# Patient Record
Sex: Male | Born: 1940 | Race: White | Hispanic: No | Marital: Married | Smoking: Former smoker
Health system: Southern US, Community
[De-identification: ages and names within clinical notes are randomized; demographics above are authoritative.]

## PROBLEM LIST (undated history)

## (undated) DIAGNOSIS — E785 Hyperlipidemia, unspecified: Secondary | ICD-10-CM

## (undated) DIAGNOSIS — E039 Hypothyroidism, unspecified: Secondary | ICD-10-CM

## (undated) DIAGNOSIS — Z9889 Other specified postprocedural states: Secondary | ICD-10-CM

## (undated) DIAGNOSIS — K5900 Constipation, unspecified: Secondary | ICD-10-CM

## (undated) DIAGNOSIS — I4819 Other persistent atrial fibrillation: Secondary | ICD-10-CM

## (undated) DIAGNOSIS — Z952 Presence of prosthetic heart valve: Secondary | ICD-10-CM

## (undated) DIAGNOSIS — N183 Chronic kidney disease, stage 3 unspecified: Secondary | ICD-10-CM

## (undated) DIAGNOSIS — Z950 Presence of cardiac pacemaker: Secondary | ICD-10-CM

## (undated) DIAGNOSIS — I739 Peripheral vascular disease, unspecified: Secondary | ICD-10-CM

## (undated) DIAGNOSIS — E038 Other specified hypothyroidism: Secondary | ICD-10-CM

## (undated) DIAGNOSIS — E119 Type 2 diabetes mellitus without complications: Secondary | ICD-10-CM

## (undated) DIAGNOSIS — I5032 Chronic diastolic (congestive) heart failure: Secondary | ICD-10-CM

## (undated) DIAGNOSIS — G4730 Sleep apnea, unspecified: Secondary | ICD-10-CM

## (undated) DIAGNOSIS — I2510 Atherosclerotic heart disease of native coronary artery without angina pectoris: Secondary | ICD-10-CM

## (undated) DIAGNOSIS — I10 Essential (primary) hypertension: Secondary | ICD-10-CM

## (undated) DIAGNOSIS — G629 Polyneuropathy, unspecified: Secondary | ICD-10-CM

## (undated) DIAGNOSIS — C4490 Unspecified malignant neoplasm of skin, unspecified: Secondary | ICD-10-CM

## (undated) DIAGNOSIS — I350 Nonrheumatic aortic (valve) stenosis: Secondary | ICD-10-CM

## (undated) DIAGNOSIS — R112 Nausea with vomiting, unspecified: Secondary | ICD-10-CM

## (undated) HISTORY — DX: Hypothyroidism, unspecified: E03.9

## (undated) HISTORY — DX: Type 2 diabetes mellitus without complications: E11.9

## (undated) HISTORY — DX: Other persistent atrial fibrillation: I48.19

## (undated) HISTORY — DX: Peripheral vascular disease, unspecified: I73.9

## (undated) HISTORY — DX: Other specified hypothyroidism: E03.8

## (undated) HISTORY — DX: Chronic kidney disease, stage 3 (moderate): N18.3

## (undated) HISTORY — DX: Chronic kidney disease, stage 3 unspecified: N18.30

## (undated) HISTORY — DX: Hyperlipidemia, unspecified: E78.5

## (undated) HISTORY — DX: Sleep apnea, unspecified: G47.30

## (undated) HISTORY — DX: Atherosclerotic heart disease of native coronary artery without angina pectoris: I25.10

## (undated) HISTORY — DX: Essential (primary) hypertension: I10

## (undated) HISTORY — PX: SKIN CANCER EXCISION: SHX779

---

## 1963-11-12 HISTORY — PX: APPENDECTOMY: SHX54

## 2009-06-16 LAB — HM COLONOSCOPY: HM Colonoscopy: NORMAL

## 2010-11-11 DIAGNOSIS — I4819 Other persistent atrial fibrillation: Secondary | ICD-10-CM | POA: Diagnosis present

## 2010-11-11 HISTORY — DX: Other persistent atrial fibrillation: I48.19

## 2010-12-12 HISTORY — PX: CARDIAC CATHETERIZATION: SHX172

## 2011-01-01 ENCOUNTER — Encounter: Payer: Self-pay | Admitting: Cardiovascular Disease

## 2011-01-09 ENCOUNTER — Encounter: Payer: Self-pay | Admitting: Cardiovascular Disease

## 2011-01-10 ENCOUNTER — Encounter: Payer: Self-pay | Admitting: Cardiovascular Disease

## 2011-01-10 ENCOUNTER — Institutional Professional Consult (permissible substitution) (INDEPENDENT_AMBULATORY_CARE_PROVIDER_SITE_OTHER): Payer: Medicare Other | Admitting: Cardiovascular Disease

## 2011-01-10 DIAGNOSIS — I4891 Unspecified atrial fibrillation: Secondary | ICD-10-CM | POA: Insufficient documentation

## 2011-01-10 DIAGNOSIS — E785 Hyperlipidemia, unspecified: Secondary | ICD-10-CM | POA: Insufficient documentation

## 2011-01-10 DIAGNOSIS — E7800 Pure hypercholesterolemia, unspecified: Secondary | ICD-10-CM

## 2011-01-10 DIAGNOSIS — I2510 Atherosclerotic heart disease of native coronary artery without angina pectoris: Secondary | ICD-10-CM | POA: Insufficient documentation

## 2011-01-10 DIAGNOSIS — E1169 Type 2 diabetes mellitus with other specified complication: Secondary | ICD-10-CM | POA: Insufficient documentation

## 2011-01-10 DIAGNOSIS — I10 Essential (primary) hypertension: Secondary | ICD-10-CM

## 2011-01-11 ENCOUNTER — Telehealth (INDEPENDENT_AMBULATORY_CARE_PROVIDER_SITE_OTHER): Payer: Self-pay | Admitting: *Deleted

## 2011-01-17 NOTE — Assessment & Plan Note (Signed)
Summary: [place]. cath was don...   Visit Type:  Initial Consult Referring Provider:  Gaspar Garbe MD  CC:  None.  History of Present Illness: 70 yo WM with h/o DM, HTN, hyperlipidemia and recent diagnosis of CAD during recent stay in [place]. He woke up and felt weak and sweaty. He was found to have atrial fibrillation with RVR. He ruled in for an MI with cardiac enzymes. Cardiac cath with occluded RCA with collaterals. Cath report is not available today but will be requested. He was hospitalized on 12/27/10 for four days at [facility] [name] KitchenHe was started on Pradaxa for anticoagulation. He has felt well since hospital discharge  without chest pain or SOB.  He has not noticed his heart racing.   Current Medications (verified): 1)  Metformin Hcl 1000 Mg Tabs (Metformin Hcl) .... Two Times A Day 2)  Glipizide Xl 10 Mg Xr24h-Tab (Glipizide) .... Two Times A Day 3)  Lantus Solostar 100 Unit/ml Soln (Insulin Glargine) .... Use As Directed 4)  Januvia 100 Mg Tabs (Sitagliptin Phosphate) .... Once Daily 5)  Humalog Kwikpen 100 Unit/ml Soln (Insulin Lispro (Human)) .... Use As Directed 6)  Atenolol 50 Mg Tabs (Atenolol) .... Take One Tablet By Mouth Daily 7)  Lipitor 80 Mg Tabs (Atorvastatin Calcium) .... Take One Tablet By Mouth Daily. 8)  Trilipix 135 Mg Cpdr (Choline Fenofibrate) .... Once Daily 9)  Aspirin Ec 325 Mg Tbec (Aspirin) .... Take One Tablet By Mouth Daily 10)  Pradaxa 150 Mg Caps (Dabigatran Etexilate Mesylate) .... Once Daily 11)  Colace 100 Mg Caps (Docusate Sodium) .[name] Kitchen.. 1 Tab, As Needed  Allergies (verified): No Known Drug Allergies  Past History:  Past Medical History: Atrial Fibrillation on Pradaxa Diabetes Type 2 Hyperlipidemia Hypertension CAD - diffuse disease with collaterals, no stents  Past Surgical History: Appendectomy age 25  Family History: Reviewed history from 01/09/2011 and no changes  required. Father:deceased age-48 ? MI, CHF Mother: Deceased age-49-DM Siblings: 2-Brothers / 1-with DM --- 2-Sisters / 1-with DM --- Daughter also has DM2  Social History: Reviewed history from 01/09/2011 and no changes required. Tobacco Use - Former.  quit at age 56 Regular Exercise - yes 2-tmes a week walks Drug Use - no No alcohol Full Time Museum/gallery exhibitions officer Married with 1-daughter  Review of Systems  The patient denies fatigue, malaise, fever, weight gain/loss, vision loss, decreased hearing, hoarseness, chest pain, palpitations, shortness of breath, prolonged cough, wheezing, sleep apnea, coughing up blood, abdominal pain, blood in stool, nausea, vomiting, diarrhea, heartburn, incontinence, blood in urine, muscle weakness, joint pain, leg swelling, rash, skin lesions, headache, fainting, dizziness, depression, anxiety, enlarged lymph nodes, easy bruising or bleeding, and environmental allergies.    Vital Signs:  Patient profile:   70 year old male Height:      72 inches Weight:      268 pounds BMI:     36.48 Pulse rate:   103 / minute BP sitting:   124 / 80  (left arm) Cuff size:   regular  Vitals Entered By: Caralee Ates CMA (January 10, 2011 4:29 PM)  Physical Exam  General:  General: Well developed, well nourished, NAD HEENT: OP clear, mucus membranes moist SKIN: warm, dry Neuro: No focal deficits Musculoskeletal: Muscle strength 5/5 all ext Psychiatric: Mood and affect normal Neck: No JVD, no carotid bruits, no thyromegaly, no lymphadenopathy. Lungs:Clear bilaterally, no wheezes, rhonci, crackles ZO:[id number],  no murmurs, gallops rubs  Abdomen: soft, NT, ND, BS present Extremities: No edema, pulses 2+.    EKG  Procedure date:  01/11/2011  Findings:      A. fib, rate 103 bpm. Non-specific T wave flattening.   Impression & Recommendations:  Problem # 1:  CAD, NATIVE VESSEL (ICD-414.01) Will get cath report. No stents were placed at the other hospital. Continue  asa, beta blocker, statin.   His updated medication list for this problem includes:    Atenolol 100 Mg Tabs (Atenolol) .[name] Kitchen... Take one tablet by mouth daily    Aspirin Ec 325 Mg Tbec (Aspirin) .[name] Kitchen... Take one tablet by mouth daily  Orders: EKG w/ Interpretation (93000)  Problem # 2:  ATRIAL FIBRILLATION (ICD-427.31) Will increase atenolol from 50 mg to 100 mg for better rate control. Continue Pradaxa.   His updated medication list for this problem includes:    Atenolol 100 Mg Tabs (Atenolol) .[name] Kitchen... Take one tablet by mouth daily    Aspirin Ec 325 Mg Tbec (Aspirin) .[name] Kitchen... Take one tablet by mouth daily  Problem # 3:  HYPERTENSION, BENIGN (ICD-401.1) Well controlled.   Problem # 4:  HYPERLIPIDEMIA-MIXED (ICD-272.4) Will get records of last fasting lipid profile. Continue statin.   His updated medication list for this problem includes:    Lipitor 80 Mg Tabs (Atorvastatin calcium) .[name] Kitchen... Take one tablet by mouth daily.    Trilipix 135 Mg Cpdr (Choline fenofibrate) ..... Once daily  Patient Instructions: 1)  Your physician recommends that you schedule a follow-up appointment in: 6 months with Dr. Clifton James 2)  Your physician has recommended you make the following change in your medication: INCREASE ATENOLOL to 100mg  by mouth daily.  Prescriptions: ATENOLOL 100 MG TABS (ATENOLOL) Take one tablet by mouth daily  #90 x 3   Entered by:   Whitney Maeola Sarah RN   Authorized by:   Verne Carrow, MD   Signed by:   Ellender Hose RN on 01/10/2011   Method used:   Faxed to ...       [facility] [address] (mail-order)       [address] [place], [place]  [address]       Ph: [phone]       Fax: [phone]   RxID:   [id number] PRADAXA 150 MG CAPS (DABIGATRAN ETEXILATE MESYLATE) take one tablet by mouth two times a day.  #180 x 3   Entered by:   Whitney Maeola Sarah RN   Authorized by:   Verne Carrow, MD   Signed by:   Ellender Hose RN on 01/10/2011   Method  used:   Faxed to ...       [facility] [address] (mail-order)       [address] [place], [place]  [address]       Ph: [phone]       Fax: [phone]   RxID:   [id number]

## 2011-01-17 NOTE — Progress Notes (Signed)
  ROI Faxed to [facility] @ [phone] [facility]  January 11, 2011 9:03 AM

## 2011-01-29 ENCOUNTER — Telehealth: Payer: Self-pay | Admitting: Cardiovascular Disease

## 2011-01-29 NOTE — Telephone Encounter (Signed)
We should add him onto my schedule to be seen. I do not think this is from the Pradaxa but I would like to see him. Thanks, chris

## 2011-01-29 NOTE — Telephone Encounter (Signed)
Phone call from wife-Patient had  SOB, swelling of feet, and tightness in chest which began over weekend.  He went to see PCP(Dr. Tisebec) yesterday who prescribed Lasix.  Today swelling is down, less SOB, and chest does not feel tight anymore.  Wife concerned because they are leaving next month for [place] and [place].  Wanted Dr. Sanjuana Kava to know and questions if his new medication Pradaxa has anything to do with it or if he needs to come in prior to trip just to make sure he will be OK.

## 2011-01-29 NOTE — Telephone Encounter (Signed)
Pt is having sob and swelling legs and feet. No chest pain.

## 2011-01-29 NOTE — Telephone Encounter (Signed)
Patient aware* He will follow-up with Dr. Clifton James 02/06/11

## 2011-02-05 ENCOUNTER — Encounter: Payer: Self-pay | Admitting: *Deleted

## 2011-02-06 ENCOUNTER — Encounter: Payer: Self-pay | Admitting: Cardiovascular Disease

## 2011-02-06 ENCOUNTER — Ambulatory Visit (INDEPENDENT_AMBULATORY_CARE_PROVIDER_SITE_OTHER): Payer: Medicare Other | Admitting: Cardiovascular Disease

## 2011-02-06 VITALS — BP 126/76 | Resp 18 | Ht 71.0 in | Wt 265.8 lb

## 2011-02-06 DIAGNOSIS — I4891 Unspecified atrial fibrillation: Secondary | ICD-10-CM

## 2011-02-06 DIAGNOSIS — I2510 Atherosclerotic heart disease of native coronary artery without angina pectoris: Secondary | ICD-10-CM

## 2011-02-06 NOTE — Assessment & Plan Note (Signed)
Stable No changes 

## 2011-02-06 NOTE — Assessment & Plan Note (Signed)
Rate controlled. He is on Pradaxa for anticoagulation. Will consider cardioversion at next visit if he remains in atrial fibrillation. No changes.

## 2011-02-06 NOTE — Progress Notes (Signed)
Addended by: Floreen Comber on: 02/06/2011 03:34 PM   Modules accepted: Orders

## 2011-02-06 NOTE — Progress Notes (Signed)
History of Present Illness: 70 yo WM with h/o DM, HTN, hyperlipidemia and recent diagnosis of CAD here for cardiac follow up. I saw him as a new patient3/02/12. Prior to his first appt, he woke up while in [place]  and felt weak and sweaty. He was found to have atrial fibrillation with RVR. He ruled in for an MI with cardiac enzymes. Cardiac cath with occluded RCA with collaterals. He was hospitalized on 12/27/10 for four days at [facility].He was started on Pradaxa for anticoagulation.   Since I saw him three weeks ago, he developed some edema and SOB and was started on Lasix per primary care. This has helped. His SOB is completely resolved. He has lost 10 pounds. No chest pain. He feels great. He has plans to retire in one week. He has not been aware of any palpitations.    Past Medical History  Diagnosis Date  . Atrial fibrillation     patient is on pradaxa  . DM type 2 (diabetes mellitus, type 2)   . Other and unspecified hyperlipidemia   . Unspecified essential hypertension   . Coronary artery disease     Cath February 2012 in [place] [facility], occluded RCA with collaterals    Past Surgical History  Procedure Date  . Appendectomy     Current Outpatient Prescriptions  Medication Sig Dispense Refill  . aspirin 325 MG tablet Take 325 mg by mouth daily.        [name] Kitchen atenolol (TENORMIN) 100 MG tablet Take 100 mg by mouth daily.        [name] Kitchen atorvastatin (LIPITOR) 80 MG tablet Take 80 mg by mouth daily.        . Choline Fenofibrate (TRILIPIX) 135 MG capsule Take 135 mg by mouth daily.        . dabigatran (PRADAXA) 150 MG CAPS Take 150 mg by mouth every 12 (twelve) hours.        . docusate sodium (COLACE) 100 MG capsule Take 100 mg by mouth as needed.        . furosemide (LASIX) 20 MG tablet Take by mouth. Pt unsure of dose.       [name] Kitchen glipiZIDE (GLUCOTROL) 10 MG tablet Take 10 mg by mouth 2 (two) times daily before a meal.        . insulin glargine (LANTUS) 100 UNIT/ML injection  Inject into the skin as directed.        . Insulin Lispro, Human, (HUMALOG KWIKPEN [place]) Inject into the skin as directed.        . metFORMIN (GLUMETZA) 1000 MG (MOD) 24 hr tablet Take 1,000 mg by mouth 2 (two) times daily.        . sitaGLIPtan (JANUVIA) 100 MG tablet Take 100 mg by mouth daily.          No Known Allergies  History   Social History  . Marital Status: Married    Spouse Name: N/A    Number of Children: N/A  . Years of Education: N/A   Occupational History  . Not on file.   Social History Main Topics  . Smoking status: Former Games developer  . Smokeless tobacco: Not on file  . Alcohol Use: No  . Drug Use: No  . Sexually Active:    Other Topics Concern  . Not on file   Social History Narrative  . No narrative on file    Family History  Problem Relation Age of Onset  . Diabetes Mother   .  Heart attack Father   . Heart failure Father   . Diabetes Other     Review of Systems:  No chest pain, SOB, palpitations, dizziness,  near syncope or syncope.  No PND, orthopnea, or Lower extremity edema.   BP 126/76  Resp 18  Ht 5\' 11"  (1.803 m)  Wt 265 lb 12.8 oz (120.566 kg)  BMI 37.07 kg/m2  Physical Examination: General: Well developed, well nourished, NAD HEENT: OP clear, mucus membranes moist SKIN: warm, dry. No rashes. Neuro: No focal deficits Musculoskeletal: Muscle strength 5/5 all ext Psychiatric: Mood and affect normal Neck: No JVD, no carotid bruits, no thyromegaly, no lymphadenopathy. Lungs:Clear bilaterally, no wheezes, rhonci, crackles Cardiovascular: Regular rate and rhythm. No murmurs, gallops or rubs. Abdomen:Soft. Bowel sounds present. Non-tender.  Extremities: No lower extremity edema. Pulses are 2 + in the bilateral DP/PT.  ZOX:WRUEAV fibrillation, rate 95 bpm. Non-specific T wave abnormalities.

## 2011-02-06 NOTE — Patient Instructions (Signed)
Your physician recommends that you schedule a follow-up appointment in: 6 months  

## 2011-02-07 NOTE — Consult Note (Signed)
Summary: [facility]   Imported By: Marylou Mccoy 01/28/2011 13:06:04  _____________________________________________________________________  External Attachment:    Type:   Image     Comment:   External Document

## 2011-06-26 ENCOUNTER — Telehealth: Payer: Self-pay | Admitting: Cardiovascular Disease

## 2011-06-26 ENCOUNTER — Encounter: Payer: Self-pay | Admitting: Cardiovascular Disease

## 2011-06-26 ENCOUNTER — Ambulatory Visit (INDEPENDENT_AMBULATORY_CARE_PROVIDER_SITE_OTHER): Payer: Medicare Other | Admitting: Cardiovascular Disease

## 2011-06-26 VITALS — BP 112/64 | HR 110 | Ht 71.0 in | Wt 263.4 lb

## 2011-06-26 DIAGNOSIS — R0609 Other forms of dyspnea: Secondary | ICD-10-CM

## 2011-06-26 DIAGNOSIS — R0600 Dyspnea, unspecified: Secondary | ICD-10-CM | POA: Insufficient documentation

## 2011-06-26 DIAGNOSIS — I4891 Unspecified atrial fibrillation: Secondary | ICD-10-CM

## 2011-06-26 DIAGNOSIS — R0989 Other specified symptoms and signs involving the circulatory and respiratory systems: Secondary | ICD-10-CM

## 2011-06-26 MED ORDER — METOPROLOL TARTRATE 50 MG PO TABS: 50.0000 mg | ORAL_TABLET | Freq: Two times a day (BID) | ORAL | Status: DC

## 2011-06-26 NOTE — Patient Instructions (Signed)
Your physician recommends that you schedule a follow-up appointment in: 2-3 weeks with [facility]   Your physician has recommended you make the following change in your medication: STOP ATENOLOL and START LOPRESSOR 50 mg twice daily.   Your physician has requested that you have an echocardiogram. Echocardiography is a painless test that uses sound waves to create images of your heart. It provides your doctor with information about the size and shape of your heart and how well your heart's chambers and valves are working. This procedure takes approximately one hour. There are no restrictions for this procedure.

## 2011-06-26 NOTE — Assessment & Plan Note (Signed)
Elevated rate. Will stop atenolol and change to Lopressor 50 mg po BID. Continue Pradaxa. At next visit, will plan DCCV if still in a. Fib.

## 2011-06-26 NOTE — Progress Notes (Signed)
History of Present Illness:70 yo WM with h/o DM, HTN, hyperlipidemia and recent diagnosis of CAD here for cardiac follow up. I saw him as a new patien t3/02/12. Prior to his first appt, he woke up while in [place] and felt weak and sweaty. He was found to have atrial fibrillation with RVR. He ruled in for an MI with cardiac enzymes. Cardiac cath with occluded RCA with collaterals. [facility] He was started on Pradaxa for anticoagulation.   He is still in atrial fibrillation. He recently went to [place] and was easily fatigued with SOB. No chest pain.    Past Medical History  Diagnosis Date  . Atrial fibrillation     patient is on pradaxa  . DM type 2 (diabetes mellitus, type 2)   . Other and unspecified hyperlipidemia   . Unspecified essential hypertension   . Coronary artery disease     Cath February 2012 in [place] [facility], occluded RCA with collaterals    Past Surgical History  Procedure Date  . Appendectomy     Current Outpatient Prescriptions  Medication Sig Dispense Refill  . aspirin 325 MG tablet Take 325 mg by mouth daily.        [name] Kitchen atenolol (TENORMIN) 100 MG tablet Take 100 mg by mouth daily.        [name] Kitchen atorvastatin (LIPITOR) 80 MG tablet Take 80 mg by mouth daily.        . BD INSULIN SYRINGE ULTRAFINE 31G X 5/16" 0.5 ML MISC       . Choline Fenofibrate (TRILIPIX) 135 MG capsule Take 135 mg by mouth daily.        . dabigatran (PRADAXA) 150 MG CAPS Take 150 mg by mouth every 12 (twelve) hours.        . docusate sodium (COLACE) 100 MG capsule Take 100 mg by mouth as needed.        [name] Kitchen FREESTYLE LITE test strip       . furosemide (LASIX) 20 MG tablet Take 20 mg by mouth daily. Pt unsure of dose.      [name] Kitchen glipiZIDE (GLUCOTROL) 10 MG tablet Take 10 mg by mouth 2 (two) times daily before a meal.        . insulin glargine (LANTUS) 100 UNIT/ML injection Inject into the skin as directed.        . Insulin Lispro, Human,  (HUMALOG KWIKPEN [place]) Inject into the skin as directed.        . metFORMIN (GLUMETZA) 1000 MG (MOD) 24 hr tablet Take 1,000 mg by mouth 2 (two) times daily.        . sitaGLIPtan (JANUVIA) 100 MG tablet Take 100 mg by mouth daily.          No Known Allergies  History   Social History  . Marital Status: Married    Spouse Name: N/A    Number of Children: N/A  . Years of Education: N/A   Occupational History  . Not on file.   Social History Main Topics  . Smoking status: Former Games developer  . Smokeless tobacco: Not on file  . Alcohol Use: No  . Drug Use: No  . Sexually Active: Not on file   Other Topics Concern  . Not on file   Social History Narrative  . No narrative on file    Family History  Problem Relation Age of Onset  . Diabetes Mother   . Heart attack  Father   . Heart failure Father   . Diabetes Other     Review of Systems:  As stated in the HPI and otherwise negative.   BP 112/64  Pulse 110  Ht 5\' 11"  (1.803 m)  Wt 263 lb 6.4 oz (119.477 kg)  BMI 36.74 kg/m2  SpO2 97%  Physical Examination: General: Well developed, well nourished, NAD HEENT: OP clear, mucus membranes moist SKIN: warm, dry. No rashes. Neuro: No focal deficits Musculoskeletal: Muscle strength 5/5 all ext Psychiatric: Mood and affect normal Neck: No JVD, no carotid bruits, no thyromegaly, no lymphadenopathy. Lungs:Clear bilaterally, no wheezes, rhonci, crackles Cardiovascular: Irregular. Tachycardic. No murmurs, gallops or rubs. Abdomen:Soft. Bowel sounds present. Non-tender.  Extremities: No lower extremity edema. Pulses are 2 + in the bilateral DP/PT.  EKG:

## 2011-06-26 NOTE — Assessment & Plan Note (Signed)
Echo to assess LV function.

## 2011-06-28 ENCOUNTER — Ambulatory Visit (HOSPITAL_COMMUNITY): Payer: Medicare Other | Attending: Cardiovascular Disease | Admitting: Radiology

## 2011-06-28 DIAGNOSIS — I079 Rheumatic tricuspid valve disease, unspecified: Secondary | ICD-10-CM | POA: Insufficient documentation

## 2011-06-28 DIAGNOSIS — I10 Essential (primary) hypertension: Secondary | ICD-10-CM | POA: Insufficient documentation

## 2011-06-28 DIAGNOSIS — R0600 Dyspnea, unspecified: Secondary | ICD-10-CM

## 2011-06-28 DIAGNOSIS — I379 Nonrheumatic pulmonary valve disorder, unspecified: Secondary | ICD-10-CM | POA: Insufficient documentation

## 2011-06-28 DIAGNOSIS — I2510 Atherosclerotic heart disease of native coronary artery without angina pectoris: Secondary | ICD-10-CM | POA: Insufficient documentation

## 2011-06-28 DIAGNOSIS — I4891 Unspecified atrial fibrillation: Secondary | ICD-10-CM

## 2011-06-28 DIAGNOSIS — I080 Rheumatic disorders of both mitral and aortic valves: Secondary | ICD-10-CM | POA: Insufficient documentation

## 2011-06-28 DIAGNOSIS — I319 Disease of pericardium, unspecified: Secondary | ICD-10-CM | POA: Insufficient documentation

## 2011-07-13 HISTORY — PX: CARDIOVERSION: SHX1299

## 2011-07-25 ENCOUNTER — Encounter: Payer: Self-pay | Admitting: *Deleted

## 2011-07-25 ENCOUNTER — Ambulatory Visit (INDEPENDENT_AMBULATORY_CARE_PROVIDER_SITE_OTHER): Payer: Medicare Other | Admitting: Cardiovascular Disease

## 2011-07-25 ENCOUNTER — Encounter: Payer: Self-pay | Admitting: Cardiovascular Disease

## 2011-07-25 VITALS — BP 127/84 | HR 109 | Ht 71.0 in | Wt 266.0 lb

## 2011-07-25 DIAGNOSIS — I4891 Unspecified atrial fibrillation: Secondary | ICD-10-CM

## 2011-07-25 DIAGNOSIS — I359 Nonrheumatic aortic valve disorder, unspecified: Secondary | ICD-10-CM

## 2011-07-25 DIAGNOSIS — I2510 Atherosclerotic heart disease of native coronary artery without angina pectoris: Secondary | ICD-10-CM

## 2011-07-25 DIAGNOSIS — I350 Nonrheumatic aortic (valve) stenosis: Secondary | ICD-10-CM

## 2011-07-25 NOTE — Assessment & Plan Note (Addendum)
Stable. No chest pain. Continue medical therapy. Will lower ASA to 81 mg po Qdaily. Continue statin, beta blocker.

## 2011-07-25 NOTE — Assessment & Plan Note (Signed)
Mild AS by echo. Repeat echo one year.

## 2011-07-25 NOTE — Patient Instructions (Signed)
Your physician has recommended that you have a Cardioversion (DCCV). Electrical Cardioversion uses a jolt of electricity to your heart either through paddles or wired patches attached to your chest. This is a controlled, usually prescheduled, procedure. Defibrillation is done under light anesthesia in the hospital, and you usually go home the day of the procedure. This is done to get your heart back into a normal rhythm. You are not awake for the procedure. Please see the instruction sheet given to you today.  Your physician recommends that you continue on your current medications as directed. Please refer to the Current Medication list given to you today.  Your physician recommends that you schedule a follow-up appointment in: 3 weeks.

## 2011-07-25 NOTE — Assessment & Plan Note (Signed)
Still in atrial fibrillation today. Rate controlled with beta blocker. Will attempt cardioversion next week. Hopefully he can maintain sinus rhythm. He is on Pradaxa.

## 2011-07-25 NOTE — Progress Notes (Signed)
History of Present Illness:70 yo WM with h/o DM, HTN, hyperlipidemia and CAD here for cardiac follow up. I saw him as a new patien 01/11/11. Prior to his first appt, he woke up while in [place] and felt weak and sweaty. He was found to have atrial fibrillation with RVR. He ruled in for an MI with cardiac enzymes. Cardiac cath with occluded RCA with collaterals. He was hospitalized on 12/27/10 for four days at [facility]. He was started on Pradaxa for anticoagulation.  At the last visit  is still in atrial fibrillation. I changed his beta blocker to Lopressor. Plans were to arrange DCCV if he was still in atrial fibrillation today. Echo 06/28/11 with low normal LVEF, 50%, mild aortic stenosis, severe hypokinesis of the basal/mid inferior wall c/w his occluded RCA.   He is here today for follow up. He continues to have some dyspnea with exertion. No chest pain. He is still in atrial fibrillation. He is not aware any palpitations.      Past Medical History  Diagnosis Date  . Atrial fibrillation     patient is on pradaxa  . DM type 2 (diabetes mellitus, type 2)   . Other and unspecified hyperlipidemia   . Unspecified essential hypertension   . Coronary artery disease     Cath February 2012 in [place] [facility], occluded RCA with collaterals    Past Surgical History  Procedure Date  . Appendectomy     Current Outpatient Prescriptions  Medication Sig Dispense Refill  . aspirin 325 MG tablet Take 325 mg by mouth daily.        [name] Kitchen atorvastatin (LIPITOR) 80 MG tablet Take 80 mg by mouth daily.        . BD INSULIN SYRINGE ULTRAFINE 31G X 5/16" 0.5 ML MISC       . Choline Fenofibrate (TRILIPIX) 135 MG capsule Take 135 mg by mouth daily.        . dabigatran (PRADAXA) 150 MG CAPS Take 150 mg by mouth every 12 (twelve) hours.        . docusate sodium (COLACE) 100 MG capsule Take 100 mg by mouth as needed.        [name] Kitchen FREESTYLE LITE test strip       . furosemide (LASIX) 20 MG tablet Take  20 mg by mouth daily. Pt unsure of dose.      [name] Kitchen glipiZIDE (GLUCOTROL) 10 MG tablet Take 10 mg by mouth 2 (two) times daily before a meal.        . insulin glargine (LANTUS) 100 UNIT/ML injection Inject 45 Units into the skin as directed.       . Insulin Lispro, Human, (HUMALOG KWIKPEN [place]) Inject 5 Units into the skin as directed.       . metFORMIN (GLUMETZA) 1000 MG (MOD) 24 hr tablet Take 1,000 mg by mouth 2 (two) times daily.        . metoprolol (LOPRESSOR) 50 MG tablet Take 1 tablet (50 mg total) by mouth 2 (two) times daily.  60 tablet  6  . sitaGLIPtan (JANUVIA) 100 MG tablet Take 100 mg by mouth daily.          No Known Allergies  History   Social History  . Marital Status: Married    Spouse Name: N/A    Number of Children: N/A  . Years of Education: N/A   Occupational History  . Not on file.   Social History Main Topics  . Smoking  status: Former Games developer  . Smokeless tobacco: Not on file  . Alcohol Use: No  . Drug Use: No  . Sexually Active: Not on file   Other Topics Concern  . Not on file   Social History Narrative  . No narrative on file    Family History  Problem Relation Age of Onset  . Diabetes Mother   . Heart attack Father   . Heart failure Father   . Diabetes Other     Review of Systems:  As stated in the HPI and otherwise negative.   BP 127/84  Pulse 109  Ht 5\' 11"  (1.803 m)  Wt 266 lb (120.657 kg)  BMI 37.10 kg/m2  Physical Examination: General: Well developed, well nourished, NAD HEENT: OP clear, mucus membranes moist SKIN: warm, dry. No rashes. Neuro: No focal deficits Musculoskeletal: Muscle strength 5/5 all ext Psychiatric: Mood and affect normal Neck: No JVD, no carotid bruits, no thyromegaly, no lymphadenopathy. Lungs:Clear bilaterally, no wheezes, rhonci, crackles Cardiovascular: Irregular. No murmurs, gallops or rubs. Abdomen:Soft. Bowel sounds present. Non-tender.  Extremities: No lower extremity edema. Pulses are 2 + in the  bilateral DP/PT.  Echo: 06/28/11:  Left ventricle: Very poor acoustic windows apically limit stud. OVerall LVEF appears to be mildly depressed at apprxoiamtely 50% with hypokinesis/akinesis of the base/mid inferior wall; hypokinesis of the basal inferoseptal wall. The cavity size was normal. - Aortic valve: AV is thickened significantly calcified. Peak and mean gradients through the valve are approximately 22 and 14 mm Hg respectively consistent with mild AS. 2D images suggest that AS would be more severe (more moderate). Would follow closely - Mitral valve: Mild regurgitation.

## 2011-07-26 NOTE — Progress Notes (Signed)
Addended by: Micki Riley C on: 07/26/2011 03:52 PM   Modules accepted: Orders

## 2011-08-02 ENCOUNTER — Ambulatory Visit (HOSPITAL_COMMUNITY)
Admission: RE | Admit: 2011-08-02 | Discharge: 2011-08-02 | Disposition: A | Discharge status: Home / Self Care | Payer: Medicare Other | Source: Ambulatory Visit | Attending: Cardiology | Admitting: Cardiology

## 2011-08-02 DIAGNOSIS — Z01812 Encounter for preprocedural laboratory examination: Secondary | ICD-10-CM | POA: Insufficient documentation

## 2011-08-02 DIAGNOSIS — I4891 Unspecified atrial fibrillation: Secondary | ICD-10-CM | POA: Insufficient documentation

## 2011-08-02 DIAGNOSIS — Z01810 Encounter for preprocedural cardiovascular examination: Secondary | ICD-10-CM | POA: Insufficient documentation

## 2011-08-02 LAB — BASIC METABOLIC PANEL
BUN: 27 mg/dL — ABNORMAL HIGH (ref 6–23)
CO2: 23 mEq/L (ref 19–32)
Calcium: 10 mg/dL (ref 8.4–10.5)
Chloride: 100 mEq/L (ref 96–112)
Creatinine, Ser: 1.46 mg/dL — ABNORMAL HIGH (ref 0.50–1.35)
GFR calc Af Amer: 58 mL/min — ABNORMAL LOW (ref >60)
GFR calc non Af Amer: 48 mL/min — ABNORMAL LOW (ref >60)
Glucose, Bld: 249 mg/dL — ABNORMAL HIGH (ref 70–99)
Potassium: 4.5 mEq/L (ref 3.5–5.1)
Sodium: 135 mEq/L (ref 135–145)

## 2011-08-02 LAB — CBC
HCT: 35.6 % — ABNORMAL LOW (ref 39.0–52.0)
Hemoglobin: 12.3 g/dL — ABNORMAL LOW (ref 13.0–17.0)
MCH: 30.7 pg (ref 26.0–34.0)
MCHC: 34.6 g/dL (ref 30.0–36.0)
MCV: 88.8 fL (ref 78.0–100.0)
Platelets: 216 10*3/uL (ref 150–400)
RBC: 4.01 MIL/uL — ABNORMAL LOW (ref 4.22–5.81)
RDW: 14.2 % (ref 11.5–15.5)
WBC: 6.5 10*3/uL (ref 4.0–10.5)

## 2011-08-02 LAB — PROTIME-INR
INR: 1.36 (ref 0.00–1.49)
Prothrombin Time: 17 seconds — ABNORMAL HIGH (ref 11.6–15.2)

## 2011-08-02 LAB — GLUCOSE, CAPILLARY: Glucose-Capillary: 264 mg/dL — ABNORMAL HIGH (ref 70–99)

## 2011-08-02 LAB — APTT: aPTT: 46 seconds — ABNORMAL HIGH (ref 24–37)

## 2011-08-08 NOTE — Op Note (Signed)
  NAMEMarland Kitchen  Steven Maldonado, Steven Maldonado NO.:  [id number]  MEDICAL RECORD NO.:  [id number]  LOCATION:  [facility]                         FACILITY:  [facility]  PHYSICIAN:  Cassell Clement, M.D. DATE OF BIRTH:  1940/12/30  DATE OF PROCEDURE:  08/02/2011 DATE OF DISCHARGE:  08/02/2011                              OPERATIVE REPORT   This 70 year old gentleman has a history of atrial fibrillation.  He has been on adequate doses of Pradaxa prior to the cardioversion.  He comes in now for elective cardioversion to short-stay.  The patient was given a total of 100 mg of propofol intravenously by Dr. Sheldon Silvan, following which he was given a 120 joules shock and failed to convert.  He was then given a 150 joules shock and failed to convert. He was then given 200 joules shock and converted to normal sinus rhythm.  The patient tolerated the procedure well.  There were no immediate postanesthetic complications.  IMPRESSION:  Successful direct current cardioversion requiring three shocks with conversion to normal sinus rhythm.          ______________________________ Cassell Clement, M.D.     TB/MEDQ  D:  08/02/2011  T:  08/02/2011  Job:  [id number]  cc:   Verne Carrow, MD  Electronically Signed by Cassell Clement M.D. on 08/08/2011 08:05:56 AM

## 2011-08-16 ENCOUNTER — Ambulatory Visit: Payer: Medicare Other | Admitting: Cardiovascular Disease

## 2011-09-02 ENCOUNTER — Ambulatory Visit (INDEPENDENT_AMBULATORY_CARE_PROVIDER_SITE_OTHER): Payer: Medicare Other | Admitting: Cardiovascular Disease

## 2011-09-02 ENCOUNTER — Encounter: Payer: Self-pay | Admitting: Cardiovascular Disease

## 2011-09-02 VITALS — BP 121/64 | HR 93 | Resp 18 | Ht 71.0 in | Wt 259.0 lb

## 2011-09-02 DIAGNOSIS — I4891 Unspecified atrial fibrillation: Secondary | ICD-10-CM

## 2011-09-02 DIAGNOSIS — I2510 Atherosclerotic heart disease of native coronary artery without angina pectoris: Secondary | ICD-10-CM

## 2011-09-02 MED ORDER — ASPIRIN 81 MG PO TABS: 81.0000 mg | ORAL_TABLET | Freq: Every day | ORAL | Status: DC

## 2011-09-02 NOTE — Assessment & Plan Note (Signed)
Stable. Will lower ASA to 81 mg po Qdaily. Continue other meds.  

## 2011-09-02 NOTE — Assessment & Plan Note (Signed)
He is in sinus today. Continue Metoprolol and Pradaxa.

## 2011-09-02 NOTE — Patient Instructions (Signed)
Your physician wants you to follow-up in:  6 months. You will receive a reminder letter in the mail two months in advance. If you don't receive a letter, please call our office to schedule the follow-up appointment.  Your physician has recommended you make the following change in your medication:  Decrease aspirin to 81 mg by mouth daily    

## 2011-09-02 NOTE — Progress Notes (Signed)
History of Present Illness:69 yo WM with h/o DM, HTN, hyperlipidemia and CAD here for cardiac follow up. I saw him as a new patient 01/11/11. Prior to his first appt, he woke up while in [place] and felt weak and sweaty. He was found to have atrial fibrillation with RVR. He ruled in for an MI with cardiac enzymes. Cardiac cath with occluded RCA with collaterals. He was hospitalized on 12/27/10 for four days at [facility]. He was started on Pradaxa for anticoagulation. I saw him last in September 2012 and he was still in atrial fibrillation. I changed his beta blocker to Lopressor. DCCV on  08/02/11 with conversion to sinus rhythm. Echo 06/28/11 with low normal LVEF, 50%, mild aortic stenosis, severe hypokinesis of the basal/mid inferior wall c/w his occluded RCA.   He is here today for follow up. He is still in sinus rhythm. He feels much better. No palpitations, chest pain or SOB.    Past Medical History  Diagnosis Date  . Atrial fibrillation     patient is on pradaxa  . DM type 2 (diabetes mellitus, type 2)   . Other and unspecified hyperlipidemia   . Unspecified essential hypertension   . Coronary artery disease     Cath February 2012 in [place] [facility], occluded RCA with collaterals    Past Surgical History  Procedure Date  . Appendectomy     Current Outpatient Prescriptions  Medication Sig Dispense Refill  . aspirin 325 MG tablet Take 325 mg by mouth daily.        [name] Kitchen atorvastatin (LIPITOR) 80 MG tablet Take 80 mg by mouth daily.        . BD INSULIN SYRINGE ULTRAFINE 31G X 5/16" 0.5 ML MISC       . Choline Fenofibrate (TRILIPIX) 135 MG capsule Take 135 mg by mouth daily.        . dabigatran (PRADAXA) 150 MG CAPS Take 150 mg by mouth every 12 (twelve) hours.        . docusate sodium (COLACE) 100 MG capsule Take 100 mg by mouth as needed.        [name] Kitchen FREESTYLE LITE test strip       . furosemide (LASIX) 20 MG tablet Take 20 mg by mouth daily. Pt unsure of dose.      [name] Kitchen  glipiZIDE (GLUCOTROL) 10 MG tablet Take 10 mg by mouth 2 (two) times daily before a meal.        . insulin glargine (LANTUS) 100 UNIT/ML injection Inject 45 Units into the skin as directed.       . Insulin Lispro, Human, (HUMALOG KWIKPEN [place]) Inject 5 Units into the skin as directed.       . metFORMIN (GLUMETZA) 1000 MG (MOD) 24 hr tablet Take 1,000 mg by mouth 2 (two) times daily.        . metoprolol (LOPRESSOR) 50 MG tablet Take 1 tablet (50 mg total) by mouth 2 (two) times daily.  60 tablet  6  . sitaGLIPtan (JANUVIA) 100 MG tablet Take 100 mg by mouth daily.          No Known Allergies  History   Social History  . Marital Status: Married    Spouse Name: N/A    Number of Children: N/A  . Years of Education: N/A   Occupational History  . Not on file.   Social History Main Topics  . Smoking status: Former Games developer  . Smokeless tobacco: Not on file  .  Alcohol Use: No  . Drug Use: No  . Sexually Active: Not on file   Other Topics Concern  . Not on file   Social History Narrative  . No narrative on file    Family History  Problem Relation Age of Onset  . Diabetes Mother   . Heart attack Father   . Heart failure Father   . Diabetes Other     Review of Systems:  As stated in the HPI and otherwise negative.   BP 121/64  Pulse 93  Resp 18  Ht 5\' 11"  (1.803 m)  Wt 259 lb (117.482 kg)  BMI 36.12 kg/m2  Physical Examination: General: Well developed, well nourished, NAD HEENT: OP clear, mucus membranes moist SKIN: warm, dry. No rashes. Neuro: No focal deficits Musculoskeletal: Muscle strength 5/5 all ext Psychiatric: Mood and affect normal Neck: No JVD, no carotid bruits, no thyromegaly, no lymphadenopathy. Lungs:Clear bilaterally, no wheezes, rhonci, crackles Cardiovascular: Regular rate and rhythm. No murmurs, gallops or rubs. Abdomen:Soft. Bowel sounds present. Non-tender.  Extremities: No lower extremity edema. Pulses are 2 + in the bilateral DP/PT.  EKG:NSR,  rate 88 bpm.

## 2011-10-10 NOTE — Progress Notes (Signed)
Addended by: Debbe Bales on: 10/10/2011 02:36 PM   Modules accepted: Orders

## 2011-11-27 DIAGNOSIS — D485 Neoplasm of uncertain behavior of skin: Secondary | ICD-10-CM | POA: Diagnosis not present

## 2011-11-27 DIAGNOSIS — L570 Actinic keratosis: Secondary | ICD-10-CM | POA: Diagnosis not present

## 2011-11-27 DIAGNOSIS — Z85828 Personal history of other malignant neoplasm of skin: Secondary | ICD-10-CM | POA: Diagnosis not present

## 2011-11-27 DIAGNOSIS — C4492 Squamous cell carcinoma of skin, unspecified: Secondary | ICD-10-CM | POA: Diagnosis not present

## 2011-12-02 DIAGNOSIS — E785 Hyperlipidemia, unspecified: Secondary | ICD-10-CM | POA: Diagnosis not present

## 2011-12-02 DIAGNOSIS — IMO0001 Reserved for inherently not codable concepts without codable children: Secondary | ICD-10-CM | POA: Diagnosis not present

## 2011-12-02 DIAGNOSIS — I509 Heart failure, unspecified: Secondary | ICD-10-CM | POA: Diagnosis not present

## 2011-12-02 DIAGNOSIS — I10 Essential (primary) hypertension: Secondary | ICD-10-CM | POA: Diagnosis not present

## 2011-12-02 LAB — HM DIABETES FOOT EXAM: HM Diabetic Foot Exam: NORMAL

## 2011-12-24 DIAGNOSIS — I2510 Atherosclerotic heart disease of native coronary artery without angina pectoris: Secondary | ICD-10-CM | POA: Diagnosis not present

## 2011-12-24 DIAGNOSIS — I10 Essential (primary) hypertension: Secondary | ICD-10-CM | POA: Diagnosis not present

## 2011-12-24 DIAGNOSIS — E1159 Type 2 diabetes mellitus with other circulatory complications: Secondary | ICD-10-CM | POA: Diagnosis not present

## 2011-12-31 DIAGNOSIS — D044 Carcinoma in situ of skin of scalp and neck: Secondary | ICD-10-CM | POA: Diagnosis not present

## 2011-12-31 DIAGNOSIS — C4492 Squamous cell carcinoma of skin, unspecified: Secondary | ICD-10-CM | POA: Diagnosis not present

## 2012-01-20 ENCOUNTER — Other Ambulatory Visit: Payer: Self-pay

## 2012-01-20 DIAGNOSIS — I4891 Unspecified atrial fibrillation: Secondary | ICD-10-CM

## 2012-01-20 MED ORDER — METOPROLOL TARTRATE 50 MG PO TABS: 50.0000 mg | ORAL_TABLET | Freq: Two times a day (BID) | ORAL | Status: DC

## 2012-01-29 ENCOUNTER — Other Ambulatory Visit: Payer: Self-pay | Admitting: Cardiovascular Disease

## 2012-01-29 ENCOUNTER — Other Ambulatory Visit: Payer: Self-pay

## 2012-01-29 MED ORDER — CHOLINE FENOFIBRATE 135 MG PO CPDR: 135.0000 mg | DELAYED_RELEASE_CAPSULE | Freq: Every day | ORAL | Status: DC

## 2012-02-04 DIAGNOSIS — I10 Essential (primary) hypertension: Secondary | ICD-10-CM | POA: Diagnosis not present

## 2012-02-04 DIAGNOSIS — I2510 Atherosclerotic heart disease of native coronary artery without angina pectoris: Secondary | ICD-10-CM | POA: Diagnosis not present

## 2012-02-04 DIAGNOSIS — IMO0001 Reserved for inherently not codable concepts without codable children: Secondary | ICD-10-CM | POA: Diagnosis not present

## 2012-03-09 ENCOUNTER — Other Ambulatory Visit: Payer: Self-pay | Admitting: Cardiovascular Disease

## 2012-03-09 MED ORDER — DABIGATRAN ETEXILATE MESYLATE 150 MG PO CAPS: 150.0000 mg | ORAL_CAPSULE | Freq: Two times a day (BID) | ORAL | Status: DC

## 2012-03-09 NOTE — Telephone Encounter (Signed)
New refill Pt wants year supply of pradaxa called to [facility] Also she wants 2 wk supply of pradaxa called [facility] [address] [phone] He only has two pills left

## 2012-03-13 ENCOUNTER — Other Ambulatory Visit: Payer: Self-pay | Admitting: *Deleted

## 2012-03-13 ENCOUNTER — Telehealth: Payer: Self-pay | Admitting: Cardiovascular Disease

## 2012-03-13 MED ORDER — DABIGATRAN ETEXILATE MESYLATE 150 MG PO CAPS: 150.0000 mg | ORAL_CAPSULE | Freq: Two times a day (BID) | ORAL | Status: DC

## 2012-03-13 NOTE — Telephone Encounter (Signed)
Refill F/u  dabigatran (PRADAXA) 150 MG CAPS   Patient was suppose to receive 1 yr prescription with 90 day supply.  Patient only received enough medication for 30 days. Please address and call patient to advise of status.

## 2012-03-13 NOTE — Telephone Encounter (Signed)
Called pt to conform that Rx has been sent for 90 days but there was no answer.    Lynetta Tomczak CMA

## 2012-03-13 NOTE — Telephone Encounter (Signed)
REFILLED MEDICATION 

## 2012-03-31 ENCOUNTER — Ambulatory Visit (INDEPENDENT_AMBULATORY_CARE_PROVIDER_SITE_OTHER): Payer: Medicare Other | Admitting: Cardiovascular Disease

## 2012-03-31 ENCOUNTER — Encounter: Payer: Self-pay | Admitting: Cardiovascular Disease

## 2012-03-31 VITALS — BP 142/67 | HR 63 | Ht 71.0 in | Wt 257.0 lb

## 2012-03-31 DIAGNOSIS — I4891 Unspecified atrial fibrillation: Secondary | ICD-10-CM

## 2012-03-31 DIAGNOSIS — I2510 Atherosclerotic heart disease of native coronary artery without angina pectoris: Secondary | ICD-10-CM

## 2012-03-31 DIAGNOSIS — I359 Nonrheumatic aortic valve disorder, unspecified: Secondary | ICD-10-CM

## 2012-03-31 DIAGNOSIS — I350 Nonrheumatic aortic (valve) stenosis: Secondary | ICD-10-CM

## 2012-03-31 MED ORDER — METOPROLOL TARTRATE 50 MG PO TABS: 50.0000 mg | ORAL_TABLET | Freq: Two times a day (BID) | ORAL | Status: DC

## 2012-03-31 NOTE — Assessment & Plan Note (Signed)
Stable. No changes. BP well controlled at home. Lipids are followed in primary care per pt and well controlled.

## 2012-03-31 NOTE — Patient Instructions (Signed)
Your physician wants you to follow-up in:  6 months. You will receive a reminder letter in the mail two months in advance. If you don't receive a letter, please call our office to schedule the follow-up appointment.  Your physician has requested that you have an echocardiogram. Echocardiography is a painless test that uses sound waves to create images of your heart. It provides your doctor with information about the size and shape of your heart and how well your heart's chambers and valves are working. This procedure takes approximately one hour. There are no restrictions for this procedure. To be done in 6 months--week or 2 prior to appt with Dr. McAlhany      

## 2012-03-31 NOTE — Assessment & Plan Note (Signed)
Maintaining NSR. Continue beta blocker. Continue Pradaxa for anti-coagulation

## 2012-03-31 NOTE — Assessment & Plan Note (Signed)
Moderate by echo August 2012. Repeat echo in 6 months.

## 2012-03-31 NOTE — Progress Notes (Signed)
History of Present Illness: 71 yo WM with h/o DM, HTN, hyperlipidemia and CAD here for cardiac follow up. I saw him as a new patient 01/11/11. Prior to his first appt, he woke up while in [place] and felt weak and sweaty. He was found to have atrial fibrillation with RVR. He ruled in for an MI with cardiac enzymes. Cardiac cath with occluded RCA with collaterals. He was hospitalized on 12/27/10 for four days at [facility]. He was started on Pradaxa for anticoagulation. I saw him in September 2012 and he was still in atrial fibrillation. I changed his beta blocker to Lopressor. DCCV on 08/02/11 with conversion to sinus rhythm. Echo 06/28/11 with low normal LVEF, 50%, mild to moderate aortic stenosis, severe hypokinesis of the basal/mid inferior wall c/w his occluded RCA.   He is here today for follow up. He is still in sinus rhythm.  No palpitations, chest pain or SOB. He has been walking a mile three days per week.   Primary Care Physician: Tisovec  Last Lipid Profile:  Followed in primary care.   Past Medical History  Diagnosis Date  . Atrial fibrillation     patient is on pradaxa  . DM type 2 (diabetes mellitus, type 2)   . Other and unspecified hyperlipidemia   . Unspecified essential hypertension   . Coronary artery disease     Cath February 2012 in [place], occluded RCA with collaterals  . Aortic stenosis     Moderate by echo 8/12    Past Surgical History  Procedure Date  . Appendectomy     Current Outpatient Prescriptions  Medication Sig Dispense Refill  . aspirin 81 MG tablet Take 1 tablet (81 mg total) by mouth daily.  30 tablet  0  . atorvastatin (LIPITOR) 80 MG tablet Take 80 mg by mouth daily.        . BD INSULIN SYRINGE ULTRAFINE 31G X 5/16" 0.5 ML MISC       . Choline Fenofibrate (TRILIPIX) 135 MG capsule Take 1 capsule (135 mg total) by mouth daily.  30 capsule  1  . dabigatran (PRADAXA) 150 MG CAPS Take 1 capsule (150 mg total) by mouth every  12 (twelve) hours.  180 capsule  3  . docusate sodium (COLACE) 100 MG capsule Take 100 mg by mouth as needed.        [name] Kitchen FREESTYLE LITE test strip       . furosemide (LASIX) 20 MG tablet Take 20 mg by mouth daily. Pt unsure of dose.      . insulin glargine (LANTUS) 100 UNIT/ML injection Inject 45 Units into the skin as directed.       . Insulin Lispro, Human, (HUMALOG KWIKPEN [place]) Inject 5 Units into the skin as directed.       . metFORMIN (GLUMETZA) 1000 MG (MOD) 24 hr tablet Take 1,000 mg by mouth 2 (two) times daily.        . metoprolol (LOPRESSOR) 50 MG tablet Take 1 tablet (50 mg total) by mouth 2 (two) times daily.  60 tablet  2  . sitaGLIPtan (JANUVIA) 100 MG tablet Take 100 mg by mouth daily.          No Known Allergies  History   Social History  . Marital Status: Married    Spouse Name: N/A    Number of Children: N/A  . Years of Education: N/A   Occupational History  . Not on file.   Social  History Main Topics  . Smoking status: Former Games developer  . Smokeless tobacco: Not on file  . Alcohol Use: No  . Drug Use: No  . Sexually Active: Not on file   Other Topics Concern  . Not on file   Social History Narrative  . No narrative on file    Family History  Problem Relation Age of Onset  . Diabetes Mother   . Heart attack Father   . Heart failure Father   . Diabetes Other     Review of Systems:  As stated in the HPI and otherwise negative.   BP 142/67  Pulse 63  Ht 5\' 11"  (1.803 m)  Wt 257 lb (116.574 kg)  BMI 35.84 kg/m2  Physical Examination: General: Well developed, well nourished, NAD HEENT: OP clear, mucus membranes moist SKIN: warm, dry. No rashes. Neuro: No focal deficits Musculoskeletal: Muscle strength 5/5 all ext Psychiatric: Mood and affect normal Neck: No JVD, no carotid bruits, no thyromegaly, no lymphadenopathy. Lungs:Clear bilaterally, no wheezes, rhonci, crackles Cardiovascular: Regular rate and rhythm. No murmurs, gallops or  rubs. Abdomen:Soft. Bowel sounds present. Non-tender.  Extremities: No lower extremity edema. Pulses are 2 + in the bilateral DP/PT.  EKG: Sinus rhythm with rate of 63 bpm. Old inferior infarct

## 2012-04-07 DIAGNOSIS — L570 Actinic keratosis: Secondary | ICD-10-CM | POA: Diagnosis not present

## 2012-04-07 DIAGNOSIS — Z85828 Personal history of other malignant neoplasm of skin: Secondary | ICD-10-CM | POA: Diagnosis not present

## 2012-04-23 ENCOUNTER — Other Ambulatory Visit: Payer: Self-pay | Admitting: Cardiovascular Disease

## 2012-04-23 DIAGNOSIS — E1159 Type 2 diabetes mellitus with other circulatory complications: Secondary | ICD-10-CM | POA: Diagnosis not present

## 2012-04-23 DIAGNOSIS — I10 Essential (primary) hypertension: Secondary | ICD-10-CM | POA: Diagnosis not present

## 2012-04-23 DIAGNOSIS — I2510 Atherosclerotic heart disease of native coronary artery without angina pectoris: Secondary | ICD-10-CM | POA: Diagnosis not present

## 2012-04-23 NOTE — Telephone Encounter (Signed)
Refilled metoprolol 

## 2012-07-15 DIAGNOSIS — E1149 Type 2 diabetes mellitus with other diabetic neurological complication: Secondary | ICD-10-CM | POA: Diagnosis not present

## 2012-07-15 DIAGNOSIS — G4733 Obstructive sleep apnea (adult) (pediatric): Secondary | ICD-10-CM | POA: Diagnosis not present

## 2012-08-05 DIAGNOSIS — H524 Presbyopia: Secondary | ICD-10-CM | POA: Diagnosis not present

## 2012-08-05 DIAGNOSIS — H353190 Nonexudative age-related macular degeneration, unspecified eye, stage unspecified: Secondary | ICD-10-CM | POA: Diagnosis not present

## 2012-08-05 DIAGNOSIS — E11339 Type 2 diabetes mellitus with moderate nonproliferative diabetic retinopathy without macular edema: Secondary | ICD-10-CM | POA: Diagnosis not present

## 2012-08-05 DIAGNOSIS — H2589 Other age-related cataract: Secondary | ICD-10-CM | POA: Diagnosis not present

## 2012-08-07 DIAGNOSIS — Z23 Encounter for immunization: Secondary | ICD-10-CM | POA: Diagnosis not present

## 2012-09-21 ENCOUNTER — Ambulatory Visit (HOSPITAL_COMMUNITY): Payer: Medicare Other | Attending: Cardiology | Admitting: Radiology

## 2012-09-21 DIAGNOSIS — E119 Type 2 diabetes mellitus without complications: Secondary | ICD-10-CM | POA: Diagnosis not present

## 2012-09-21 DIAGNOSIS — E785 Hyperlipidemia, unspecified: Secondary | ICD-10-CM | POA: Insufficient documentation

## 2012-09-21 DIAGNOSIS — E669 Obesity, unspecified: Secondary | ICD-10-CM | POA: Diagnosis not present

## 2012-09-21 DIAGNOSIS — I10 Essential (primary) hypertension: Secondary | ICD-10-CM | POA: Diagnosis not present

## 2012-09-21 DIAGNOSIS — I359 Nonrheumatic aortic valve disorder, unspecified: Secondary | ICD-10-CM

## 2012-09-21 DIAGNOSIS — I350 Nonrheumatic aortic (valve) stenosis: Secondary | ICD-10-CM

## 2012-09-21 DIAGNOSIS — I080 Rheumatic disorders of both mitral and aortic valves: Secondary | ICD-10-CM | POA: Insufficient documentation

## 2012-09-21 NOTE — Progress Notes (Signed)
Echocardiogram performed.  

## 2012-09-23 ENCOUNTER — Telehealth: Payer: Self-pay | Admitting: Cardiovascular Disease

## 2012-09-23 NOTE — Telephone Encounter (Signed)
Pt rtn your call

## 2012-09-23 NOTE — Telephone Encounter (Signed)
Spoke with pt and reviewed echo results with him.  

## 2012-09-30 ENCOUNTER — Encounter: Payer: Self-pay | Admitting: Cardiovascular Disease

## 2012-09-30 ENCOUNTER — Ambulatory Visit (INDEPENDENT_AMBULATORY_CARE_PROVIDER_SITE_OTHER): Payer: Medicare Other | Admitting: Cardiovascular Disease

## 2012-09-30 VITALS — BP 138/68 | HR 63 | Ht 71.0 in | Wt 251.0 lb

## 2012-09-30 DIAGNOSIS — I359 Nonrheumatic aortic valve disorder, unspecified: Secondary | ICD-10-CM | POA: Diagnosis not present

## 2012-09-30 DIAGNOSIS — I2510 Atherosclerotic heart disease of native coronary artery without angina pectoris: Secondary | ICD-10-CM

## 2012-09-30 DIAGNOSIS — I4891 Unspecified atrial fibrillation: Secondary | ICD-10-CM

## 2012-09-30 DIAGNOSIS — I350 Nonrheumatic aortic (valve) stenosis: Secondary | ICD-10-CM

## 2012-09-30 NOTE — Progress Notes (Signed)
History of Present Illness: 71 yo WM with h/o DM, HTN, hyperlipidemia and CAD here for cardiac follow up. I saw him as a new patient 01/11/11. Prior to his first appt, he woke up while in [place] and felt weak and sweaty. He was found to have atrial fibrillation with RVR. He ruled in for an MI with cardiac enzymes. Cardiac cath with occluded RCA with collaterals. He was hospitalized on 12/27/10 for four days at [facility]. He was started on Pradaxa for anticoagulation. I saw him in September 2012 and he was still in atrial fibrillation. I changed his beta blocker to Lopressor. DCCV on 08/02/11 with conversion to sinus rhythm. Echo 06/28/11 with low normal LVEF, 50%, mild to moderate aortic stenosis, severe hypokinesis of the basal/mid inferior wall c/w his occluded RCA.   He is here today for follow up. He is still in sinus rhythm. No palpitations, chest pain or SOB. He has been walking a mile three days per week. He feels great.   Primary Care Physician: Tisovec   Last Lipid Profile: Followed in primary care.    Past Medical History  Diagnosis Date  . Atrial fibrillation     patient is on pradaxa  . DM type 2 (diabetes mellitus, type 2)   . Other and unspecified hyperlipidemia   . Unspecified essential hypertension   . Coronary artery disease     Cath February 2012 in [place], occluded RCA with collaterals  . Aortic stenosis     Moderate by echo 8/12    Past Surgical History  Procedure Date  . Appendectomy     Current Outpatient Prescriptions  Medication Sig Dispense Refill  . aspirin 81 MG tablet Take 1 tablet (81 mg total) by mouth daily.  30 tablet  0  . atorvastatin (LIPITOR) 80 MG tablet Take 80 mg by mouth daily.        . BD INSULIN SYRINGE ULTRAFINE 31G X 5/16" 0.5 ML MISC       . Choline Fenofibrate (TRILIPIX) 135 MG capsule Take 1 capsule (135 mg total) by mouth daily.  30 capsule  1  . dabigatran (PRADAXA) 150 MG CAPS Take 1 capsule (150 mg  total) by mouth every 12 (twelve) hours.  180 capsule  3  . docusate sodium (COLACE) 100 MG capsule Take 100 mg by mouth as needed.        [name] Kitchen FREESTYLE LITE test strip       . furosemide (LASIX) 20 MG tablet Take 20 mg by mouth daily. Pt unsure of dose.      . insulin glargine (LANTUS) 100 UNIT/ML injection Inject 45 Units into the skin as directed.       . Insulin Lispro, Human, (HUMALOG KWIKPEN [place]) Inject 5 Units into the skin as directed.       . metFORMIN (GLUMETZA) 1000 MG (MOD) 24 hr tablet Take 1,000 mg by mouth 2 (two) times daily.        . metoprolol (LOPRESSOR) 50 MG tablet Take 1 tablet (50 mg total) by mouth 2 (two) times daily.  60 tablet  11  . metoprolol (LOPRESSOR) 50 MG tablet TAKE 1 TABLET BY MOUTH 2 (TWO) TIMES DAILY.  60 tablet  5  . sitaGLIPtan (JANUVIA) 100 MG tablet Take 100 mg by mouth daily.          No Known Allergies  History   Social History  . Marital Status: Married    Spouse Name: N/A  Number of Children: N/A  . Years of Education: N/A   Occupational History  . Not on file.   Social History Main Topics  . Smoking status: Former Games developer  . Smokeless tobacco: Not on file  . Alcohol Use: No  . Drug Use: No  . Sexually Active: Not on file   Other Topics Concern  . Not on file   Social History Narrative  . No narrative on file    Family History  Problem Relation Age of Onset  . Diabetes Mother   . Heart attack Father   . Heart failure Father   . Diabetes Other     Review of Systems:  As stated in the HPI and otherwise negative.   BP 138/68  Pulse 63  Ht 5\' 11"  (1.803 m)  Wt 251 lb (113.853 kg)  BMI 35.01 kg/m2  Physical Examination: General: Well developed, well nourished, NAD HEENT: OP clear, mucus membranes moist SKIN: warm, dry. No rashes. Neuro: No focal deficits Musculoskeletal: Muscle strength 5/5 all ext Psychiatric: Mood and affect normal Neck: No JVD, no carotid bruits, no thyromegaly, no lymphadenopathy. Lungs:Clear  bilaterally, no wheezes, rhonci, crackles Cardiovascular: Regular rate and rhythm. Systolic murmur. No  gallops or rubs. Abdomen:Soft. Bowel sounds present. Non-tender.  Extremities: No lower extremity edema. Pulses are 2 + in the bilateral DP/PT.  EKG: NSR, rate 63 bpm. LVH. 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 Diffuse ST and T wave abnormalities.   Echo 09/21/12:  Left ventricle: The cavity size was normal. Wall thickness was increased in a pattern of mild LVH. Systolic function was normal. The estimated ejection fraction was in the range of 55% to 60%. Wall motion was normal; there were no regional wall motion abnormalities. Doppler parameters are consistent with abnormal left ventricular relaxation (grade 1 diastolic dysfunction). - Aortic valve: Valve mobility was restricted. There was moderate stenosis. Mean gradient: 26mm Hg (S). Peak gradient: 42mm Hg (S). - Mitral valve: Calcified annulus. - Left atrium: The atrium was moderately dilated.  Assessment and Plan:   1. CAD: Stable. No changes. BP well controlled. Lipids are followed in primary care per pt and well controlled.   2. Aortic stenosis: Moderate by echo November 2013. Repeat echo in one year.   3. ATRIAL FIBRILLATION: Maintaining NSR. Continue beta blocker. Continue Pradaxa for anti-coagulation

## 2012-09-30 NOTE — Patient Instructions (Addendum)
Your physician wants you to follow-up in:  6 months. You will receive a reminder letter in the mail two months in advance. If you don't receive a letter, please call our office to schedule the follow-up appointment.   

## 2012-10-06 DIAGNOSIS — E1139 Type 2 diabetes mellitus with other diabetic ophthalmic complication: Secondary | ICD-10-CM | POA: Diagnosis not present

## 2012-10-06 DIAGNOSIS — E11339 Type 2 diabetes mellitus with moderate nonproliferative diabetic retinopathy without macular edema: Secondary | ICD-10-CM | POA: Diagnosis not present

## 2012-10-06 DIAGNOSIS — E11329 Type 2 diabetes mellitus with mild nonproliferative diabetic retinopathy without macular edema: Secondary | ICD-10-CM | POA: Diagnosis not present

## 2012-10-06 DIAGNOSIS — H2589 Other age-related cataract: Secondary | ICD-10-CM | POA: Diagnosis not present

## 2012-10-06 LAB — HM DIABETES EYE EXAM: HM Diabetic Eye Exam: NORMAL

## 2012-12-08 DIAGNOSIS — L570 Actinic keratosis: Secondary | ICD-10-CM | POA: Diagnosis not present

## 2012-12-08 DIAGNOSIS — Z85828 Personal history of other malignant neoplasm of skin: Secondary | ICD-10-CM | POA: Diagnosis not present

## 2013-02-01 ENCOUNTER — Encounter: Payer: Self-pay | Admitting: Cardiovascular Disease

## 2013-02-01 ENCOUNTER — Ambulatory Visit (INDEPENDENT_AMBULATORY_CARE_PROVIDER_SITE_OTHER): Payer: Medicare Other | Admitting: Cardiovascular Disease

## 2013-02-01 VITALS — BP 140/72 | HR 74 | Ht 71.0 in | Wt 252.0 lb

## 2013-02-01 DIAGNOSIS — I4891 Unspecified atrial fibrillation: Secondary | ICD-10-CM | POA: Diagnosis not present

## 2013-02-01 DIAGNOSIS — I359 Nonrheumatic aortic valve disorder, unspecified: Secondary | ICD-10-CM | POA: Diagnosis not present

## 2013-02-01 DIAGNOSIS — I2510 Atherosclerotic heart disease of native coronary artery without angina pectoris: Secondary | ICD-10-CM | POA: Diagnosis not present

## 2013-02-01 DIAGNOSIS — I350 Nonrheumatic aortic (valve) stenosis: Secondary | ICD-10-CM

## 2013-02-01 LAB — CBC WITH DIFFERENTIAL/PLATELET
Basophils Absolute: 0 10*3/uL (ref 0.0–0.1)
Basophils Relative: 0.7 % (ref 0.0–3.0)
Eosinophils Absolute: 0.2 10*3/uL (ref 0.0–0.7)
Eosinophils Relative: 2.9 % (ref 0.0–5.0)
HCT: 37 % — ABNORMAL LOW (ref 39.0–52.0)
Hemoglobin: 13 g/dL (ref 13.0–17.0)
Lymphocytes Relative: 26.9 % (ref 12.0–46.0)
Lymphs Abs: 1.6 10*3/uL (ref 0.7–4.0)
MCHC: 35.2 g/dL (ref 30.0–36.0)
MCV: 92.5 fl (ref 78.0–100.0)
Monocytes Absolute: 0.6 10*3/uL (ref 0.1–1.0)
Monocytes Relative: 10.2 % (ref 3.0–12.0)
Neutro Abs: 3.6 10*3/uL (ref 1.4–7.7)
Neutrophils Relative %: 59.3 % (ref 43.0–77.0)
Platelets: 195 10*3/uL (ref 150.0–400.0)
RBC: 4.01 Mil/uL — ABNORMAL LOW (ref 4.22–5.81)
RDW: 13.3 % (ref 11.5–14.6)
WBC: 6 10*3/uL (ref 4.5–10.5)

## 2013-02-01 LAB — BASIC METABOLIC PANEL
BUN: 22 mg/dL (ref 6–23)
CO2: 25 mEq/L (ref 19–32)
Calcium: 9.6 mg/dL (ref 8.4–10.5)
Chloride: 98 mEq/L (ref 96–112)
Creatinine, Ser: 1.3 mg/dL (ref 0.4–1.5)
GFR: 59.17 mL/min — ABNORMAL LOW (ref >60.00)
Glucose, Bld: 387 mg/dL — ABNORMAL HIGH (ref 70–99)
Potassium: 4.4 mEq/L (ref 3.5–5.1)
Sodium: 133 mEq/L — ABNORMAL LOW (ref 135–145)

## 2013-02-01 NOTE — Progress Notes (Signed)
History of Present Illness: 72 yo WM with h/o DM, HTN, hyperlipidemia and CAD here for cardiac follow up. I saw him as a new patient 01/11/11. Prior to his first appt, he woke up while in [place] and felt weak and sweaty. He was found to have atrial fibrillation with RVR. He ruled in for an MI with cardiac enzymes. Cardiac cath with occluded RCA with collaterals. He was hospitalized on 12/27/10 for four days at [facility]. He was started on Pradaxa for anticoagulation. I saw him in September 2012 and he was still in atrial fibrillation. I changed his beta blocker to Lopressor. DCCV on 08/02/11 with conversion to sinus rhythm. Echo November 2013 LVEF=55-60%, moderate aortic stenosis, normal wall motion reported.    He is here today for follow up. He is still in sinus rhythm. Rare palpitations. No chest pain or SOB. He has been walking a mile three days per week. He feels great.   Primary Care Physician: Tisovec   Last Lipid Profile: Followed in primary care.   Past Medical History  Diagnosis Date  . Atrial fibrillation     patient is on pradaxa  . DM type 2 (diabetes mellitus, type 2)   . Other and unspecified hyperlipidemia   . Unspecified essential hypertension   . Coronary artery disease     Cath February 2012 in [place], occluded RCA with collaterals  . Aortic stenosis     Moderate by echo 8/12    Past Surgical History  Procedure Laterality Date  . Appendectomy      Current Outpatient Prescriptions  Medication Sig Dispense Refill  . aspirin 81 MG tablet Take 1 tablet (81 mg total) by mouth daily.  30 tablet  0  . atorvastatin (LIPITOR) 80 MG tablet Take 80 mg by mouth daily.        . BD INSULIN SYRINGE ULTRAFINE 31G X 5/16" 0.5 ML MISC       . Choline Fenofibrate (TRILIPIX) 135 MG capsule Take 1 capsule (135 mg total) by mouth daily.  30 capsule  1  . dabigatran (PRADAXA) 150 MG CAPS Take 1 capsule (150 mg total) by mouth every 12 (twelve) hours.  180  capsule  3  . docusate sodium (COLACE) 100 MG capsule Take 100 mg by mouth as needed.        [name] Kitchen FREESTYLE LITE test strip       . furosemide (LASIX) 20 MG tablet Take 20 mg by mouth daily. Pt unsure of dose.      . insulin glargine (LANTUS) 100 UNIT/ML injection Inject 45 Units into the skin as directed.       . Insulin Lispro, Human, (HUMALOG KWIKPEN [place]) Inject 5 Units into the skin as directed.       . metFORMIN (GLUMETZA) 1000 MG (MOD) 24 hr tablet Take 1,000 mg by mouth 2 (two) times daily.        . metoprolol (LOPRESSOR) 50 MG tablet Take 1 tablet (50 mg total) by mouth 2 (two) times daily.  60 tablet  11  . metoprolol (LOPRESSOR) 50 MG tablet TAKE 1 TABLET BY MOUTH 2 (TWO) TIMES DAILY.  60 tablet  5  . sitaGLIPtan (JANUVIA) 100 MG tablet Take 100 mg by mouth daily.         No current facility-administered medications for this visit.    No Known Allergies  History   Social History  . Marital Status: Married    Spouse Name: N/A  Number of Children: N/A  . Years of Education: N/A   Occupational History  . Not on file.   Social History Main Topics  . Smoking status: Former Games developer  . Smokeless tobacco: Not on file  . Alcohol Use: No  . Drug Use: No  . Sexually Active: Not on file   Other Topics Concern  . Not on file   Social History Narrative  . No narrative on file    Family History  Problem Relation Age of Onset  . Diabetes Mother   . Heart attack Father   . Heart failure Father   . Diabetes Other     Review of Systems:  As stated in the HPI and otherwise negative.   BP 140/72  Pulse 74  Ht 5\' 11"  (1.803 m)  Wt 252 lb (114.306 kg)  BMI 35.16 kg/m2  SpO2 97%  Physical Examination: General: Well developed, well nourished, NAD HEENT: OP clear, mucus membranes moist SKIN: warm, dry. No rashes. Neuro: No focal deficits Musculoskeletal: Muscle strength 5/5 all ext Psychiatric: Mood and affect normal Neck: No JVD, no carotid bruits, no thyromegaly, no  lymphadenopathy. Lungs:Clear bilaterally, no wheezes, rhonci, crackles Cardiovascular: Regular rate and rhythm. Systolic murmur, gallops or rubs. Abdomen:Soft. Bowel sounds present. Non-tender.  Extremities: No lower extremity edema. Pulses are 2 + in the bilateral DP/PT.  Echo 09/21/12:  Left ventricle: The cavity size was normal. Wall thickness was increased in a pattern of mild LVH. Systolic function was normal. The estimated ejection fraction was in the range of 55% to 60%. Wall motion was normal; there were no regional wall motion abnormalities. Doppler parameters are consistent with abnormal left ventricular relaxation (grade 1 diastolic dysfunction). - Aortic valve: Valve mobility was restricted. There was moderate stenosis. Mean gradient: 26mm Hg (S). Peak gradient: 42mm Hg (S). - Mitral valve: Calcified annulus. - Left atrium: The atrium was moderately dilated.  Assessment and Plan:   1. CAD: Stable. No changes. BP well controlled. Lipids are followed in primary care per pt and well controlled.   2. Aortic stenosis: Moderate by echo November 2013. Repeat echo in November 2014.   3. ATRIAL FIBRILLATION: Maintaining NSR. Continue beta blocker. Continue Pradaxa for anti-coagulation. Will check BMET and CBC today since he is on Pradaxa.

## 2013-02-01 NOTE — Patient Instructions (Signed)
Your physician wants you to follow-up in:  December 2014 You will receive a reminder letter in the mail two months in advance. If you don't receive a letter, please call our office to schedule the follow-up appointment.  Your physician has requested that you have an echocardiogram. Echocardiography is a painless test that uses sound waves to create images of your heart. It provides your doctor with information about the size and shape of your heart and how well your heart's chambers and valves are working. This procedure takes approximately one hour. There are no restrictions for this procedure. To be done in November 2014

## 2013-03-11 ENCOUNTER — Ambulatory Visit: Payer: Medicare Other | Admitting: Cardiovascular Disease

## 2013-04-06 DIAGNOSIS — H6120 Impacted cerumen, unspecified ear: Secondary | ICD-10-CM | POA: Diagnosis not present

## 2013-04-06 DIAGNOSIS — H9190 Unspecified hearing loss, unspecified ear: Secondary | ICD-10-CM | POA: Diagnosis not present

## 2013-04-22 ENCOUNTER — Other Ambulatory Visit: Payer: Self-pay | Admitting: Cardiovascular Disease

## 2013-04-25 ENCOUNTER — Other Ambulatory Visit: Payer: Self-pay | Admitting: Cardiovascular Disease

## 2013-04-26 ENCOUNTER — Other Ambulatory Visit: Payer: Self-pay | Admitting: *Deleted

## 2013-04-26 MED ORDER — METOPROLOL TARTRATE 50 MG PO TABS: ORAL_TABLET | ORAL | Status: DC

## 2013-05-04 DIAGNOSIS — H33309 Unspecified retinal break, unspecified eye: Secondary | ICD-10-CM | POA: Diagnosis not present

## 2013-05-04 DIAGNOSIS — E1139 Type 2 diabetes mellitus with other diabetic ophthalmic complication: Secondary | ICD-10-CM | POA: Diagnosis not present

## 2013-05-04 DIAGNOSIS — E11329 Type 2 diabetes mellitus with mild nonproliferative diabetic retinopathy without macular edema: Secondary | ICD-10-CM | POA: Diagnosis not present

## 2013-05-04 DIAGNOSIS — H2589 Other age-related cataract: Secondary | ICD-10-CM | POA: Diagnosis not present

## 2013-05-06 ENCOUNTER — Encounter: Payer: Self-pay | Admitting: Physician Assistant

## 2013-05-06 ENCOUNTER — Ambulatory Visit (INDEPENDENT_AMBULATORY_CARE_PROVIDER_SITE_OTHER): Payer: Medicare Other | Admitting: Physician Assistant

## 2013-05-06 ENCOUNTER — Other Ambulatory Visit: Payer: Self-pay | Admitting: Physician Assistant

## 2013-05-06 VITALS — BP 118/60 | HR 60 | Temp 97.9°F | Resp 18 | Ht 69.5 in | Wt 251.0 lb

## 2013-05-06 DIAGNOSIS — E785 Hyperlipidemia, unspecified: Secondary | ICD-10-CM | POA: Diagnosis not present

## 2013-05-06 DIAGNOSIS — G4733 Obstructive sleep apnea (adult) (pediatric): Secondary | ICD-10-CM

## 2013-05-06 DIAGNOSIS — I10 Essential (primary) hypertension: Secondary | ICD-10-CM | POA: Diagnosis not present

## 2013-05-06 DIAGNOSIS — E119 Type 2 diabetes mellitus without complications: Secondary | ICD-10-CM | POA: Diagnosis not present

## 2013-05-06 DIAGNOSIS — I2510 Atherosclerotic heart disease of native coronary artery without angina pectoris: Secondary | ICD-10-CM | POA: Diagnosis not present

## 2013-05-06 DIAGNOSIS — I350 Nonrheumatic aortic (valve) stenosis: Secondary | ICD-10-CM

## 2013-05-06 DIAGNOSIS — I359 Nonrheumatic aortic valve disorder, unspecified: Secondary | ICD-10-CM

## 2013-05-06 DIAGNOSIS — I4891 Unspecified atrial fibrillation: Secondary | ICD-10-CM

## 2013-05-06 DIAGNOSIS — Z125 Encounter for screening for malignant neoplasm of prostate: Secondary | ICD-10-CM | POA: Diagnosis not present

## 2013-05-06 LAB — CBC WITH DIFFERENTIAL/PLATELET
Basophils Absolute: 0.1 10*3/uL (ref 0.0–0.1)
Basophils Relative: 1 % (ref 0–1)
Eosinophils Absolute: 0.3 10*3/uL (ref 0.0–0.7)
Eosinophils Relative: 4 % (ref 0–5)
HCT: 37.2 % — ABNORMAL LOW (ref 39.0–52.0)
Hemoglobin: 13.1 g/dL (ref 13.0–17.0)
Lymphocytes Relative: 28 % (ref 12–46)
Lymphs Abs: 1.8 10*3/uL (ref 0.7–4.0)
MCH: 31.5 pg (ref 26.0–34.0)
MCHC: 35.2 g/dL (ref 30.0–36.0)
MCV: 89.4 fL (ref 78.0–100.0)
Monocytes Absolute: 0.8 10*3/uL (ref 0.1–1.0)
Monocytes Relative: 12 % (ref 3–12)
Neutro Abs: 3.6 10*3/uL (ref 1.7–7.7)
Neutrophils Relative %: 55 % (ref 43–77)
Platelets: 236 10*3/uL (ref 150–400)
RBC: 4.16 MIL/uL — ABNORMAL LOW (ref 4.22–5.81)
RDW: 13.6 % (ref 11.5–15.5)
WBC: 6.5 10*3/uL (ref 4.0–10.5)

## 2013-05-06 LAB — LIPID PANEL
Cholesterol: 155 mg/dL (ref 0–200)
HDL: 26 mg/dL — ABNORMAL LOW (ref >39)
LDL Cholesterol: 76 mg/dL (ref 0–99)
Total CHOL/HDL Ratio: 6 Ratio
Triglycerides: 265 mg/dL — ABNORMAL HIGH (ref <150)
VLDL: 53 mg/dL — ABNORMAL HIGH (ref 0–40)

## 2013-05-06 LAB — COMPLETE METABOLIC PANEL WITH GFR
ALT: 26 U/L (ref 0–53)
AST: 24 U/L (ref 0–37)
Albumin: 4.5 g/dL (ref 3.5–5.2)
Alkaline Phosphatase: 34 U/L — ABNORMAL LOW (ref 39–117)
BUN: 24 mg/dL — ABNORMAL HIGH (ref 6–23)
CO2: 23 mEq/L (ref 19–32)
Calcium: 10.1 mg/dL (ref 8.4–10.5)
Chloride: 102 mEq/L (ref 96–112)
Creat: 1.5 mg/dL — ABNORMAL HIGH (ref 0.50–1.35)
GFR, Est African American: 54 mL/min — ABNORMAL LOW
GFR, Est Non African American: 47 mL/min — ABNORMAL LOW
Glucose, Bld: 200 mg/dL — ABNORMAL HIGH (ref 70–99)
Potassium: 4.5 mEq/L (ref 3.5–5.3)
Sodium: 136 mEq/L (ref 135–145)
Total Bilirubin: 0.4 mg/dL (ref 0.3–1.2)
Total Protein: 7 g/dL (ref 6.0–8.3)

## 2013-05-06 LAB — TSH: TSH: 5.47 u[IU]/mL — ABNORMAL HIGH (ref 0.350–4.500)

## 2013-05-06 LAB — PSA, MEDICARE: PSA: 2.14 ng/mL (ref <=4.00)

## 2013-05-06 LAB — HEMOGLOBIN A1C
Hgb A1c MFr Bld: 10.8 % — ABNORMAL HIGH (ref <5.7)
Mean Plasma Glucose: 263 mg/dL — ABNORMAL HIGH (ref <117)

## 2013-05-06 MED ORDER — CHOLINE FENOFIBRATE 135 MG PO CPDR: 135.0000 mg | DELAYED_RELEASE_CAPSULE | Freq: Every day | ORAL | Status: DC

## 2013-05-06 MED ORDER — DOCUSATE SODIUM 100 MG PO CAPS: 100.0000 mg | ORAL_CAPSULE | ORAL | Status: DC | PRN

## 2013-05-06 MED ORDER — DABIGATRAN ETEXILATE MESYLATE 150 MG PO CAPS: 150.0000 mg | ORAL_CAPSULE | Freq: Two times a day (BID) | ORAL | Status: DC

## 2013-05-06 MED ORDER — INSULIN LISPRO 100 UNIT/ML (KWIKPEN): 5.0000 [IU] | PEN_INJECTOR | SUBCUTANEOUS | Status: DC

## 2013-05-06 MED ORDER — FUROSEMIDE 20 MG PO TABS: 20.0000 mg | ORAL_TABLET | Freq: Every day | ORAL | Status: DC

## 2013-05-06 MED ORDER — ATORVASTATIN CALCIUM 80 MG PO TABS: 80.0000 mg | ORAL_TABLET | Freq: Every day | ORAL | Status: DC

## 2013-05-06 MED ORDER — METFORMIN HCL ER (MOD) 1000 MG PO TB24: 1000.0000 mg | ORAL_TABLET | Freq: Two times a day (BID) | ORAL | Status: DC

## 2013-05-06 MED ORDER — METOPROLOL TARTRATE 50 MG PO TABS: 50.0000 mg | ORAL_TABLET | Freq: Two times a day (BID) | ORAL | Status: DC

## 2013-05-06 MED ORDER — INSULIN GLARGINE 100 UNIT/ML ~~LOC~~ SOLN: 45.0000 [IU] | SUBCUTANEOUS | Status: DC

## 2013-05-06 NOTE — Progress Notes (Signed)
Patient ID: Steven Maldonado MRN: [id number], DOB: Dec 01, 1940 72 y.o. Date of Encounter: 05/06/2013, 10:54 AM    Chief Complaint: Physical (CPE) (Medicare-Level 4 OV)  HPI: 72 y.o. y/o male here to establish care. He states that he was seeing Dr. Royann Shivers at [facility] as PCP. LOV and last lab there eabout 1 year ago.  He has been seeing Cardiologist at [facility] every 3 months. He has also seen Dermatology. And has Seen Dr Minna Merritts for OSA. These are all of his medical providers.   He has no active complaints today.   DM: He does use meds as directed, including Lantus 45 units QHS and Humalog 5 units TID with meals.  When he "has time to do what he is supposed to" he checks BS BID. If he is busy, does not check that often but does check fiairly regularly. Readings are usually pretty good.   HLD: Taking Atrovastatin and Fenofibrate as directed. Is fasting. No myalgias or other adv effects  HTN: Taking meds as directed. No adv effects.   Review of Systems: Consitutional: No fever, chills, fatigue, night sweats, lymphadenopathy, or weight changes. Eyes: No visual changes, eye redness, or discharge. ENT/Mouth: Ears: No otalgia, tinnitus, hearing loss, discharge. Nose: No congestion, rhinorrhea, sinus pain, or epistaxis. Throat: No sore throat, post nasal drip, or teeth pain. Cardiovascular: No CP, palpitations, diaphoresis, DOE, edema, orthopnea, PND. Respiratory: No cough, hemoptysis, SOB, or wheezing. Gastrointestinal: No anorexia, dysphagia, reflux, pain, nausea, vomiting, hematemesis, diarrhea, constipation, BRBPR, or melena. Genitourinary: No dysuria, frequency, urgency, hematuria, incontinence, nocturia, decreased urinary stream, discharge, impotence, or testicular pain/masses. Musculoskeletal: No decreased ROM, myalgias, stiffness, joint swelling, or weakness. Skin: No rash, erythema, lesion changes, pain, warmth, jaundice, or pruritis. Neurological: No headache, dizziness,  syncope, seizures, tremors, memory loss, coordination problems, or paresthesias. Psychological: No anxiety, depression, hallucinations, SI/HI. Endocrine: No fatigue, polydipsia, polyphagia, polyuria, or known diabetes. All other systems were reviewed and are otherwise negative.  Past Medical History  Diagnosis Date  . Atrial fibrillation     patient is on pradaxa  . DM type 2 (diabetes mellitus, type 2)   . Other and unspecified hyperlipidemia   . Unspecified essential hypertension   . Coronary artery disease     Cath February 2012 in [place] [facility], occluded RCA with collaterals  . Aortic stenosis     Moderate by echo 8/12  . Obstructive sleep apnea      Past Surgical History  Procedure Laterality Date  . Appendectomy      Home Meds:  Current Outpatient Prescriptions on File Prior to Visit  Medication Sig Dispense Refill  . aspirin 81 MG tablet Take 1 tablet (81 mg total) by mouth daily.  30 tablet  0  . BD INSULIN SYRINGE ULTRAFINE 31G X 5/16" 0.5 ML MISC       . FREESTYLE LITE test strip        No current facility-administered medications on file prior to visit.    Allergies: No Known Allergies  History   Social History  . Marital Status: Married    Spouse Name: N/A    Number of Children: N/A  . Years of Education: N/A   Occupational History  . Not on file.   Social History Main Topics  . Smoking status: Former Smoker    Quit date: 11/11/1972  . Smokeless tobacco: Not on file  . Alcohol Use: No  . Drug Use: No  . Sexually Active: Not on file   Other Topics Concern  .  Not on file   Social History Narrative  . No narrative on file    Family History  Problem Relation Age of Onset  . Diabetes Mother   . Heart attack Father   . Heart failure Father   . Diabetes Other     Physical Exam: Blood pressure 118/60, pulse 60, temperature 97.9 F (36.6 C), temperature source Oral, resp. rate 18, height 5' 9.5" (1.765 m), weight 251 lb (113.853 kg).  General:  WNWD WM. Mild Obese. Well developed, well nourished, in no acute distress. HEENT: Normocephalic, atraumatic. Conjunctiva pink, sclera non-icteric. Pupils 2 mm constricting to 1 mm, round, regular, and equally reactive to light and accomodation. EOMI. Internal auditory canal clear. TMs with good cone of light and without pathology. Nasal mucosa pink. Nares are without discharge. No sinus tenderness. Oral mucosa pink. . Pharynx without exudate.   Neck: Supple. Trachea midline. No thyromegaly. Full ROM. No lymphadenopathy.No carotid bruits. Lungs: Clear to auscultation bilaterally without wheezes, rales, or rhonchi. Breathing is of normal effort and unlabored. Cardiovascular: Irregular. III-IV/ VI murmur throughout precordium. Distal pulses 2+ symmetrically. No carotid or abdominal bruits. Abdomen: Soft, non-tender, non-distended with normoactive bowel sounds. No hepatosplenomegaly or masses. No rebound/guarding. No CVA tenderness. No hernias. Rectal: No external hemorrhoids or fissures. Rectal vault without masses. Prostate gland firm and smooth. No nodularity, tenderness, mass, or induration.  Musculoskeletal: Full range of motion and 5/5 strength throughout. Without swelling, atrophy, tenderness, crepitus, or warmth. Extremities without clubbing, cyanosis, or edema. Calves supple. Skin: Warm and moist without erythema, ecchymosis, wounds, or rash. Neuro: A+Ox3. CN II-XII grossly intact. Moves all extremities spontaneously. Full sensation throughout. Normal gait. DTR 2+ throughout upper and lower extremities. Finger to nose intact. Psych:  Responds to questions appropriately with a normal affect. Diabetic FootExam: see attached section. Exam normal.   Assessment/Plan:  72 y.o. y/o  male here for CPE -1. CAD, NATIVE VESSEL Per Cardiology.  Will check CBC to monitor H/H given Pradaxa and ASA use.  - CBC with Differential  2. Atrial fibrillation Management Per Cardiology.  - CBC with Differential -  TSH  3. Aortic stenosis Murmur on exam. This is being managed by Cardiology.  4. Diabetes: Check A1C, MicroAlbumin.  He reprts he had eye exam last Tuesday-normal. Foot exam today normal.  Cont current meds for now. Adjust if needed once get A1C result.   5. HYPERLIPIDEMIA-MIXED On Atorvastatin and Fenofibrate. Is fasting. Recheck now. I checked in Epic-No FLP in Epic with Cardiology - COMPLETE METABOLIC PANEL WITH GFR - Lipid panel  6. HYPERTENSION, BENIGN At goal. Cont current meds. Check lab to monitor. - COMPLETE METABOLIC PANEL WITH GFR  7. Obstructive sleep apnea Uses CPAP. Managed by Dr. Minna Merritts  8. Screening PSA (prostate specific antigen) - PSA, Medicare  9. Screening For Colorectal Cancer:  He reports he had colonoscopy 5 years ago-normal.  10. Immunizations: Flu                  ---- Tetanus         ----- Pneumococcal---- Zostavax       ------        He says he has had all of these. He is even able to name them off himself.   ROV 3 months.  Signed:   [address] [place], [place]  05/06/2013 10:54 AM

## 2013-05-07 LAB — T4: T4, Total: 8.5 ug/dL (ref 5.0–12.5)

## 2013-05-07 LAB — T3, FREE: T3, Free: 3.4 pg/mL (ref 2.3–4.2)

## 2013-05-27 ENCOUNTER — Encounter: Payer: Self-pay | Admitting: Physician Assistant

## 2013-05-27 ENCOUNTER — Ambulatory Visit (INDEPENDENT_AMBULATORY_CARE_PROVIDER_SITE_OTHER): Payer: Medicare Other | Admitting: Physician Assistant

## 2013-05-27 VITALS — BP 120/70 | HR 64 | Temp 97.4°F | Resp 20 | Ht 69.75 in | Wt 247.0 lb

## 2013-05-27 DIAGNOSIS — E119 Type 2 diabetes mellitus without complications: Secondary | ICD-10-CM | POA: Diagnosis not present

## 2013-05-27 NOTE — Progress Notes (Signed)
Patient ID: Steven Maldonado MRN: [id number], DOB: Jul 27, 1941, 72 y.o. Date of Encounter: 05/27/2013, 7:11 PM    Chief Complaint:  Chief Complaint  Patient presents with  . diabetic check up     HPI: 72 y.o. year old white male here for f/u of uncontrolled Diabetes.  He was seen by me as a new pt to this office on 05/06/13. At that time, he had known DM, HTN, HLD and was already on meds for these. At that ov his BP was at goal. We obtained labs. CBC, CMET, PSA, TSH, Free T4 were done. These were all ok. However, his A1C was 10.8.  At that OV he reported that he was on Lantus 45 units QD. When I got this lab result, he was told to increase Lantus to 55 units QD. He is here for f/u of this.  He says he did increase the Lantus to 55 units QD as directed. As well, right after that phone call, he started walking BID. HE is now walking one mile BID but he plans to gradually increase this.  Also, he says he has gone to dietician 3-4 times over past 15 years. Says he know what to eat but had gotten off track. He says he has gotten back on track with this.   Fasting BS; all <130. Past few days: 120, 81, 98. He sometimes check before supper also-gets 160-180.   Home Meds: See attached medication section for any medications that were entered at today's visit. The computer does not put those onto this list.The following list is a list of meds entered prior to today's visit.   Current Outpatient Prescriptions on File Prior to Visit  Medication Sig Dispense Refill  . aspirin 81 MG tablet Take 1 tablet (81 mg total) by mouth daily.  30 tablet  0  . atorvastatin (LIPITOR) 80 MG tablet Take 1 tablet (80 mg total) by mouth at bedtime.  90 tablet  3  . BD INSULIN SYRINGE ULTRAFINE 31G X 5/16" 0.5 ML MISC       . Choline Fenofibrate (TRILIPIX) 135 MG capsule Take 1 capsule (135 mg total) by mouth daily.  90 capsule  3  . dabigatran (PRADAXA) 150 MG CAPS Take 1 capsule (150 mg total) by mouth every 12  (twelve) hours.  180 capsule  3  . docusate sodium (COLACE) 100 MG capsule Take 1 capsule (100 mg total) by mouth as needed.  90 capsule  3  . FREESTYLE LITE test strip       . furosemide (LASIX) 20 MG tablet Take 1 tablet (20 mg total) by mouth daily. Pt unsure of dose.  90 tablet  3  . insulin lispro (HUMALOG KWIKPEN) 100 UNIT/ML SOPN Inject 5 Units into the skin as directed.  10 pen  3  . metFORMIN (GLUMETZA) 1000 MG (MOD) 24 hr tablet Take 1 tablet (1,000 mg total) by mouth 2 (two) times daily.  180 tablet  3  . metoprolol (LOPRESSOR) 50 MG tablet Take 1 tablet (50 mg total) by mouth 2 (two) times daily.  180 tablet  3   No current facility-administered medications on file prior to visit.    Allergies: No Known Allergies    Review of Systems: See HPI for pertinent ROS. All other ROS negative.    Physical Exam: Blood pressure 120/70, pulse 64, temperature 97.4 F (36.3 C), temperature source Oral, resp. rate 20, height 5' 9.75" (1.772 m), weight 247 lb (112.038 kg)., Body mass index is  35.68 kg/(m^2). General: Overweight WM. Appears in no acute distress. Neck: Supple. No thyromegaly. No lymphadenopathy. No carotid bruit. Lungs: Clear bilaterally to auscultation without wheezes, rales, or rhonchi. Breathing is unlabored. Heart: Irregular. III-IV/VI murmur throughout precordium Abdomen: Soft, non-tender, non-distended with normoactive bowel sounds. No hepatomegaly. No rebound/guarding. No obvious abdominal masses. Msk:  Strength and tone normal for age. Extremities/Skin: Warm and dry. No clubbing or cyanosis. No edema. No rashes or suspicious lesions. Neuro: Alert and oriented X 3. Moves all extremities spontaneously. Gait is normal. CNII-XII grossly in tact. Psych:  Responds to questions appropriately with a normal affect.     ASSESSMENT AND PLAN:  72 y.o. year old male with  1. DM type 2 (diabetes mellitus, type 2) Keep up the good work with the diet and exercise!!! Cont  current meds as they are. He is concerned that if he does further improve diet/exercise/weight loss that he may dev hypoglycemia. I reviewed goal BS and discussed how to slowly adjust insulin dose if needed. ROV 3 months, sooner if needed.    [address] [place], [place], [facility] 05/27/2013 7:11 PM

## 2013-06-02 ENCOUNTER — Telehealth: Payer: Self-pay | Admitting: Physician Assistant

## 2013-06-02 DIAGNOSIS — E119 Type 2 diabetes mellitus without complications: Secondary | ICD-10-CM

## 2013-06-02 MED ORDER — "INSULIN SYRINGE-NEEDLE U-100 31G X 5/16"" 0.5 ML MISC": 1.0000 | Freq: Every day | Status: DC

## 2013-06-02 NOTE — Telephone Encounter (Signed)
Diabetic supplies refilled 

## 2013-06-24 ENCOUNTER — Encounter: Payer: Self-pay | Admitting: Family Medicine

## 2013-07-15 ENCOUNTER — Encounter: Payer: Self-pay | Admitting: Neurology

## 2013-07-15 ENCOUNTER — Ambulatory Visit (INDEPENDENT_AMBULATORY_CARE_PROVIDER_SITE_OTHER): Payer: Medicare Other | Admitting: Neurology

## 2013-07-15 VITALS — BP 129/62 | HR 71 | Resp 18 | Ht 72.0 in | Wt 251.0 lb

## 2013-07-15 DIAGNOSIS — G4733 Obstructive sleep apnea (adult) (pediatric): Secondary | ICD-10-CM

## 2013-07-15 DIAGNOSIS — Z9989 Dependence on other enabling machines and devices: Secondary | ICD-10-CM

## 2013-07-15 DIAGNOSIS — G4730 Sleep apnea, unspecified: Secondary | ICD-10-CM | POA: Insufficient documentation

## 2013-07-15 DIAGNOSIS — E669 Obesity, unspecified: Secondary | ICD-10-CM | POA: Insufficient documentation

## 2013-07-15 NOTE — Progress Notes (Signed)
[facility]  Provider:  Melvyn Novas, M D  Referring Provider: Gaspar Garbe, MD Primary Care Physician:  Frazier Richards, [name]  Chief Complaint  Patient presents with  . Follow-up    OSA,rm 11    HPI:  Steven Maldonado is a 72 y.o. male  Is seen here as a referral/ revisit  from Dr. Wylene Simmer for follow up  Upon seep apnea treatmentn, CPAP compliance.   Mr. Melvyn Novas was originally diagnosed with an AHI of 39 of 32.9. Titrated to CPAP to 15 cm and changed temporarily to a BiPAP. The patient did however advanced under 14 cm CPAP and has continued to use his machine at this setting. His past medical history was hypertension, diabetes, insulin-dependent, hyperlipidemia, coronary artery disease, and atrial fibrillation. He recently got an update from his cardiologist  ,.stating that his heart was doing well and that there seems to be in no acute problem in terms of atrial fibrillation or his valve.  The patient's cardiologist is Dr. Irean Hong  Cardiology.  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 There is no shift work history, his bedtime is around midnight and since his retirement he doesn't need to raise the right in the morning - sleeps until  8 or 9 AM. He considers his sleep refreshing, has one  nocturia ,  but he still sometimes has a dry mouth. He had reported that this water reservoir of the CPAP machine gets depleted every night and continues to do. He has a full face mask.  Was able today to compare the CPAP  download from September 2013 with the download from this year 2014 . The patient average usage for CPAP is 5 hours and 45 minutes his 100% compliance, his residual AHI is 0.5% pressure is now 14 cm EPI level is 3 cm water. The air leak has been reduced since he changed to a smaller version of a full face mask.   GDS score is 4, the patient had more falls in the last 6 months.  Epworth is 2 .          Review of Systems: Out of a complete  14 system review, the patient complains of only the following symptoms, and all other reviewed systems are negative. FSS 9, epworth 2, 100% compliant with CPAP at 14 cm water.  Medium sized mirage quattro.   History   Social History  . Marital Status: Married    Spouse Name: N/A    Number of Children: 1  . Years of Education: 12   Occupational History  . retired    Social History Main Topics  . Smoking status: Former Smoker    Quit date: 11/11/1972  . Smokeless tobacco: Not on file  . Alcohol Use: No     Comment: quit in 1984  . Drug Use: No  . Sexual Activity: Not on file   Other Topics Concern  . Not on file   Social History Narrative  . No narrative on file    Family History  Problem Relation Age of Onset  . Diabetes Mother   . Heart attack Father   . Heart failure Father   . Diabetes Other   . Diabetes Sister   . Diabetes Brother   . Diabetes Daughter     TYPE ll  . Heart Problems Daughter     Past Medical History  Diagnosis Date  . Atrial fibrillation     patient is on pradaxa  .  Other and unspecified hyperlipidemia   . Unspecified essential hypertension   . Coronary artery disease     Cath February 2012 in [place] [facility], occluded RCA with collaterals  . Aortic stenosis     Moderate by echo 8/12  . Obstructive sleep apnea     2012  . DM type 2 (diabetes mellitus, type 2)   . High cholesterol   . Heart disease   . CAD (coronary artery disease)   . Sleep apnea with use of continuous positive airway pressure (CPAP)     04-11-11 AHI was 32.9 and titrated to 15 cm H20, DME is [facility]    Past Surgical History  Procedure Laterality Date  . Appendectomy  1965  . Carduoversion  07/2011  . Skin cancer excision Bilateral     hand, face, neck    Current Outpatient Prescriptions  Medication Sig Dispense Refill  . aspirin 81 MG tablet Take 1 tablet (81 mg total) by mouth daily.  30 tablet  0  . atorvastatin (LIPITOR) 80 MG tablet Take 1 tablet (80 mg total) by  mouth at bedtime.  90 tablet  3  . Choline Fenofibrate (TRILIPIX) 135 MG capsule Take 1 capsule (135 mg total) by mouth daily.  90 capsule  3  . dabigatran (PRADAXA) 150 MG CAPS Take 1 capsule (150 mg total) by mouth every 12 (twelve) hours.  180 capsule  3  . docusate sodium (COLACE) 100 MG capsule Take 1 capsule (100 mg total) by mouth as needed.  90 capsule  3  . FREESTYLE LITE test strip       . furosemide (LASIX) 20 MG tablet Take 1 tablet (20 mg total) by mouth daily. Pt unsure of dose.  90 tablet  3  . insulin glargine (LANTUS) 100 UNIT/ML injection Inject 55 Units into the skin as directed.      . insulin lispro (HUMALOG KWIKPEN) 100 UNIT/ML SOPN Inject 5 Units into the skin as directed.  10 pen  3  . Insulin Syringe-Needle U-100 (BD INSULIN SYRINGE ULTRAFINE) 31G X 5/16" 0.5 ML MISC Inject 1 each into the skin daily.  100 each  3  . metFORMIN (GLUMETZA) 1000 MG (MOD) 24 hr tablet Take 1 tablet (1,000 mg total) by mouth 2 (two) times daily.  180 tablet  3  . metoprolol (LOPRESSOR) 50 MG tablet Take 1 tablet (50 mg total) by mouth 2 (two) times daily.  180 tablet  3   No current facility-administered medications for this visit.    Allergies as of 07/15/2013  . (No Known Allergies)    Vitals: BP 129/62  Pulse 71  Resp 18  Ht 6' (1.829 m)  Wt 251 lb (113.853 kg)  BMI 34.03 kg/m2 Last Weight:  Wt Readings from Last 1 Encounters:  07/15/13 251 lb (113.853 kg)   Last Height:   Ht Readings from Last 1 Encounters:  07/15/13 6' (1.829 m)   Physical exam:  General: The patient is awake, alert and appears not in acute distress. The patient is well groomed. Head: Normocephalic, atraumatic. Neck is supple. Mallampati 3 , neck circumference: 19.5 inches, no nasal deviation, mld duysarthria ,  Dysphonia.  Cardiovascular:  Irregular , without  murmurs or carotid bruit, and without distended neck veins. Respiratory: Lungs are clear to auscultation. Skin:  Without evidence of edema, or  rash Trunk: BMI is elevated . Patient  has normal posture.  Neurologic exam : The patient is awake and alert, oriented to place and time.  Memory subjective  described as intact. There is a normal attention span & concentration ability. Speech is fluent without  aphasia. Mood and affect are appropriate.  Cranial nerves: Pupils are equal and briskly reactive to light. Funduscopic exam without  evidence of pallor or edema. Extraocular movements  in vertical and horizontal planes intact and without nystagmus. Visual fields by finger perimetry are intact. Hearing to finger rub intact.  Facial sensation intact to fine touch. Facial motor strength is symmetric and tongue and uvula move midline.  Motor exam:   Normal tone and normal muscle bulk and symmetric normal strength in all extremities.  Sensory:  Fine touch, pinprick and vibration were tested in all extremities. Proprioception is normal.  Coordination: Rapid alternating movements in the fingers/hands is tested and normal. Finger-to-nose maneuver normal.  Gait and station: Patient walks without assistive device - Strength within normal limits. Stance is stable and normal.  Assessment:   OSA with overweight patient , and higher grade Mallompati AHI  is 0.5 on CPAP of 14 cm water. Continue use.  100% compliance. [facility] is his DME.    Plan:  Treatment plan and additional workup :  CPAP at 14 cm water , no adjustment.  Weight reduction discussed. Main risk factor for this patient.

## 2013-07-15 NOTE — Patient Instructions (Signed)
Diabetes Meal Planning Guide The diabetes meal planning guide is a tool to help you plan your meals and snacks. It is important for people with diabetes to manage their blood glucose (sugar) levels. Choosing the right foods and the right amounts throughout your day will help control your blood glucose. Eating right can even help you improve your blood pressure and reach or maintain a healthy weight. CARBOHYDRATE COUNTING MADE EASY When you eat carbohydrates, they turn to sugar. This raises your blood glucose level. Counting carbohydrates can help you control this level so you feel better. When you plan your meals by counting carbohydrates, you can have more flexibility in what you eat and balance your medicine with your food intake. Carbohydrate counting simply means adding up the total amount of carbohydrate grams in your meals and snacks. Try to eat about the same amount at each meal. Foods with carbohydrates are listed below. Each portion below is 1 carbohydrate serving or 15 grams of carbohydrates. Ask your dietician how many grams of carbohydrates you should eat at each meal or snack. Grains and Starches  1 slice bread.   English muffin or hotdog/hamburger bun.   cup cold cereal (unsweetened).   cup cooked pasta or rice.   cup starchy vegetables (corn, potatoes, peas, beans, winter squash).  1 tortilla (6 inches).   bagel.  1 waffle or pancake (size of a CD).   cup cooked cereal.  4 to 6 small crackers. *Whole grain is recommended. Fruit  1 cup fresh unsweetened berries, melon, papaya, pineapple.  1 small fresh fruit.   banana or mango.   cup fruit juice (4 oz unsweetened).   cup canned fruit in natural juice or water.  2 tbs dried fruit.  12 to 15 grapes or cherries. Milk and Yogurt  1 cup fat-free or 1% milk.  1 cup soy milk.  6 oz light yogurt with sugar-free sweetener.  6 oz low-fat soy yogurt.  6 oz plain yogurt. Vegetables  1 cup raw or  cup  cooked is counted as 0 carbohydrates or a "free" food.  If you eat 3 or more servings at 1 meal, count them as 1 carbohydrate serving. Other Carbohydrates   oz chips or pretzels.   cup ice cream or frozen yogurt.   cup sherbet or sorbet.  2 inch square cake, no frosting.  1 tbs honey, sugar, jam, jelly, or syrup.  2 small cookies.  3 squares of graham crackers.  3 cups popcorn.  6 crackers.  1 cup broth-based soup.  Count 1 cup casserole or other mixed foods as 2 carbohydrate servings.  Foods with less than 20 calories in a serving may be counted as 0 carbohydrates or a "free" food. You may want to purchase a book or computer software that lists the carbohydrate gram counts of different foods. In addition, the nutrition facts panel on the labels of the foods you eat are a good source of this information. The label will tell you how big the serving size is and the total number of carbohydrate grams you will be eating per serving. Divide this number by 15 to obtain the number of carbohydrate servings in a portion. Remember, 1 carbohydrate serving equals 15 grams of carbohydrate. SERVING SIZES Measuring foods and serving sizes helps you make sure you are getting the right amount of food. The list below tells how big or small some common serving sizes are.  1 oz.........4 stacked dice.  3 oz.........Deck of cards.  1 tsp........Tip   of little finger.  1 tbs........Thumb.  2 tbs........Golf ball.   cup.......Half of a fist.  1 cup........A fist. SAMPLE DIABETES MEAL PLAN Below is a sample meal plan that includes foods from the grain and starches, dairy, vegetable, fruit, and meat groups. A dietician can individualize a meal plan to fit your calorie needs and tell you the number of servings needed from each food group. However, controlling the total amount of carbohydrates in your meal or snack is more important than making sure you include all of the food groups at every  meal. You may interchange carbohydrate containing foods (dairy, starches, and fruits). The meal plan below is an example of a 2000 calorie diet using carbohydrate counting. This meal plan has 17 carbohydrate servings. Breakfast  1 cup oatmeal (2 carb servings).   cup light yogurt (1 carb serving).  1 cup blueberries (1 carb serving).   cup almonds. Snack  1 large apple (2 carb servings).  1 low-fat string cheese stick. Lunch  Chicken breast salad.  1 cup spinach.   cup chopped tomatoes.  2 oz chicken breast, sliced.  2 tbs low-fat Italian dressing.  12 whole-wheat crackers (2 carb servings).  12 to 15 grapes (1 carb serving).  1 cup low-fat milk (1 carb serving). Snack  1 cup carrots.   cup hummus (1 carb serving). Dinner  3 oz broiled salmon.  1 cup brown rice (3 carb servings). Snack  1  cups steamed broccoli (1 carb serving) drizzled with 1 tsp olive oil and lemon juice.  1 cup light pudding (2 carb servings). DIABETES MEAL PLANNING WORKSHEET Your dietician can use this worksheet to help you decide how many servings of foods and what types of foods are right for you.  BREAKFAST Food Group and Servings / Carb Servings Grain/Starches __________________________________ Dairy __________________________________________ Vegetable ______________________________________ Fruit ___________________________________________ Meat __________________________________________ Fat ____________________________________________ LUNCH Food Group and Servings / Carb Servings Grain/Starches ___________________________________ Dairy ___________________________________________ Fruit ____________________________________________ Meat ___________________________________________ Fat _____________________________________________ DINNER Food Group and Servings / Carb Servings Grain/Starches ___________________________________ Dairy  ___________________________________________ Fruit ____________________________________________ Meat ___________________________________________ Fat _____________________________________________ SNACKS Food Group and Servings / Carb Servings Grain/Starches ___________________________________ Dairy ___________________________________________ Vegetable _______________________________________ Fruit ____________________________________________ Meat ___________________________________________ Fat _____________________________________________ DAILY TOTALS Starches _________________________ Vegetable ________________________ Fruit ____________________________ Dairy ____________________________ Meat ____________________________ Fat ______________________________ Document Released: 07/25/2005 Document Revised: 01/20/2012 Document Reviewed: 06/05/2009 [employer] Patient Information 2014 [employer]. Exercise to Lose Weight Exercise and a healthy diet may help you lose weight. Your doctor may suggest specific exercises. EXERCISE IDEAS AND TIPS  Choose low-cost things you enjoy doing, such as walking, bicycling, or exercising to workout videos.  Take stairs instead of the elevator.  Walk during your lunch break.  Park your car further away from work or school.  Go to a gym or an exercise class.  Start with 5 to 10 minutes of exercise each day. Build up to 30 minutes of exercise 4 to 6 days a week.  Wear shoes with good support and comfortable clothes.  Stretch before and after working out.  Work out until you breathe harder and your heart beats faster.  Drink extra water when you exercise.  Do not do so much that you hurt yourself, feel dizzy, or get very short of breath. Exercises that burn about 150 calories:  Running 1  miles in 15 minutes.  Playing volleyball for 45 to 60 minutes.  Washing and waxing a car for 45 to 60 minutes.  Playing touch football for 45  minutes.  Walking 1    miles in 35 minutes.  Pushing a stroller 1  miles in 30 minutes.  Playing basketball for 30 minutes.  Raking leaves for 30 minutes.  Bicycling 5 miles in 30 minutes.  Walking 2 miles in 30 minutes.  Dancing for 30 minutes.  Shoveling snow for 15 minutes.  Swimming laps for 20 minutes.  Walking up stairs for 15 minutes.  Bicycling 4 miles in 15 minutes.  Gardening for 30 to 45 minutes.  Jumping rope for 15 minutes.  Washing windows or floors for 45 to 60 minutes. Document Released: 11/30/2010 Document Revised: 01/20/2012 Document Reviewed: 11/30/2010 [facility] Patient Information 2014 [place], [place].

## 2013-07-21 ENCOUNTER — Encounter: Payer: Self-pay | Admitting: Neurology

## 2013-08-20 DIAGNOSIS — E1139 Type 2 diabetes mellitus with other diabetic ophthalmic complication: Secondary | ICD-10-CM | POA: Diagnosis not present

## 2013-08-20 DIAGNOSIS — H2589 Other age-related cataract: Secondary | ICD-10-CM | POA: Diagnosis not present

## 2013-08-20 DIAGNOSIS — H5200 Hypermetropia, unspecified eye: Secondary | ICD-10-CM | POA: Diagnosis not present

## 2013-08-20 DIAGNOSIS — E11339 Type 2 diabetes mellitus with moderate nonproliferative diabetic retinopathy without macular edema: Secondary | ICD-10-CM | POA: Diagnosis not present

## 2013-08-20 LAB — HM DIABETES EYE EXAM

## 2013-08-30 ENCOUNTER — Ambulatory Visit (INDEPENDENT_AMBULATORY_CARE_PROVIDER_SITE_OTHER): Payer: Medicare Other | Admitting: Physician Assistant

## 2013-08-30 ENCOUNTER — Encounter: Payer: Self-pay | Admitting: Physician Assistant

## 2013-08-30 ENCOUNTER — Other Ambulatory Visit: Payer: Self-pay | Admitting: Family Medicine

## 2013-08-30 VITALS — BP 132/66 | HR 60 | Temp 97.7°F | Resp 18 | Wt 248.0 lb

## 2013-08-30 DIAGNOSIS — E119 Type 2 diabetes mellitus without complications: Secondary | ICD-10-CM

## 2013-08-30 DIAGNOSIS — N189 Chronic kidney disease, unspecified: Secondary | ICD-10-CM | POA: Diagnosis not present

## 2013-08-30 DIAGNOSIS — I359 Nonrheumatic aortic valve disorder, unspecified: Secondary | ICD-10-CM

## 2013-08-30 DIAGNOSIS — I10 Essential (primary) hypertension: Secondary | ICD-10-CM | POA: Diagnosis not present

## 2013-08-30 DIAGNOSIS — E669 Obesity, unspecified: Secondary | ICD-10-CM

## 2013-08-30 DIAGNOSIS — E785 Hyperlipidemia, unspecified: Secondary | ICD-10-CM | POA: Diagnosis not present

## 2013-08-30 DIAGNOSIS — I2510 Atherosclerotic heart disease of native coronary artery without angina pectoris: Secondary | ICD-10-CM

## 2013-08-30 DIAGNOSIS — E038 Other specified hypothyroidism: Secondary | ICD-10-CM

## 2013-08-30 DIAGNOSIS — G4730 Sleep apnea, unspecified: Secondary | ICD-10-CM

## 2013-08-30 DIAGNOSIS — I350 Nonrheumatic aortic (valve) stenosis: Secondary | ICD-10-CM

## 2013-08-30 DIAGNOSIS — I4891 Unspecified atrial fibrillation: Secondary | ICD-10-CM

## 2013-08-30 DIAGNOSIS — E039 Hypothyroidism, unspecified: Secondary | ICD-10-CM | POA: Insufficient documentation

## 2013-08-30 DIAGNOSIS — G4733 Obstructive sleep apnea (adult) (pediatric): Secondary | ICD-10-CM

## 2013-08-30 LAB — MICROALBUMIN, URINE: Microalb, Ur: 0.77 mg/dL (ref 0.00–1.89)

## 2013-08-30 LAB — HEMOGLOBIN A1C, FINGERSTICK: Hgb A1C (fingerstick): 7.6 % — ABNORMAL HIGH (ref <5.7)

## 2013-08-30 MED ORDER — INSULIN PEN NEEDLE 32G X 4 MM MISC: 1.0000 | Freq: Three times a day (TID) | Status: DC

## 2013-08-30 NOTE — Progress Notes (Signed)
Patient ID: Steven Maldonado MRN: [id number], DOB: [birth year], 72 y.o. Date of Encounter: @DATE @  Chief Complaint:  Chief Complaint  Patient presents with  . routine check up    3 mth,  is fasting    HPI: 72 y.o. year old white male  presents for routine followup.  He was seen by me as a new patient to this office on 05/06/13. At that time he had known diabetes, hypertension, hyperlipidemia and was already on medications for these. At that visit his blood pressure was at goal. Obtained labs. CBC, CM ET, PSA, TSH and free T4 were done. Labs revealed subclinical hypothyroidism.  Otherwise all labs were normal except his A1c was elevated at 10.8. At that office visit 05/06/13 he reported that he was on Lantus 45 units daily. When I obtained the lab results I informed him to increase Lantus to 55 units daily.   He did return for followup office visit of this on 05/27/13. At that visit he reported that he did increase the Lantus to 55 units daily as directed. Also he reported that when we called him with the initial lab results from June he started walking twice a day. He was walking 1 mile twice a day and plan to gradually increase this. At the visit in July he reported that he had gone to a dietitian 3 or 4 times over the past 15 years. Stated he need what to eat but had gotten off track. That he is getting back on track with his diet.  At his followup visit 05/27/13 he reported the following blood sugar readings: Fasting blood sugar were all less than 130.   The last several readings were 120, 81, 98. He was occasionally checking blood sugar before supper and was getting 160-180.  These are good readings I told him to keep up the good work with diet and exercise! Told him to continue current medications as they were.  He is now here for 70 month followup visit. He reports that he is now walking 1 mile twice a day for 5 days per week. Says he was trying to do this almost 7 days a week by his  wife recently had surgery on her back and hip that he's doing for 5 days per week. Says that he is getting his diet back on track and has been compliant with this. Says that he has decreased his belt size by 1 loop. He is on Lantus at 55 units each bedtime. He reports that his fasting morning blood sugars are mostly 125 to 1:30. He never checks his blood sugar later during the day.  He is taking his blood pressure medications as directed with no adverse effects. No lightheadedness or lower extremity edema.  He is taking his Lipitor as directed. No myalgias or other adverse effects.   Past Medical History  Diagnosis Date  . Atrial fibrillation     patient is on pradaxa  . Other and unspecified hyperlipidemia   . Unspecified essential hypertension   . Coronary artery disease     Cath February 2012 in [place] [facility], occluded RCA with collaterals  . Aortic stenosis     Moderate by echo 8/12  . Obstructive sleep apnea     2012  . DM type 2 (diabetes mellitus, type 2)   . High cholesterol   . Heart disease   . CAD (coronary artery disease)   . Sleep apnea with use of continuous positive airway pressure (CPAP)  04-11-11 AHI was 32.9 and titrated to 15 cm H20, DME is [facility]  . Chronic kidney disease 08/30/2013  . Subclinical hypothyroidism 08/30/2013     Home Meds: See attached medication section for current medication list. Any medications entered into computer today will not appear on this note's list. The medications listed below were entered prior to today. Current Outpatient Prescriptions on File Prior to Visit  Medication Sig Dispense Refill  . aspirin 81 MG tablet Take 1 tablet (81 mg total) by mouth daily.  30 tablet  0  . atorvastatin (LIPITOR) 80 MG tablet Take 1 tablet (80 mg total) by mouth at bedtime.  90 tablet  3  . Choline Fenofibrate (TRILIPIX) 135 MG capsule Take 1 capsule (135 mg total) by mouth daily.  90 capsule  3  . dabigatran (PRADAXA) 150 MG CAPS Take 1 capsule (150  mg total) by mouth every 12 (twelve) hours.  180 capsule  3  . docusate sodium (COLACE) 100 MG capsule Take 1 capsule (100 mg total) by mouth as needed.  90 capsule  3  . FREESTYLE LITE test strip       . furosemide (LASIX) 20 MG tablet Take 1 tablet (20 mg total) by mouth daily. Pt unsure of dose.  90 tablet  3  . insulin glargine (LANTUS) 100 UNIT/ML injection Inject 55 Units into the skin as directed.      . insulin lispro (HUMALOG KWIKPEN) 100 UNIT/ML SOPN Inject 5 Units into the skin as directed.  10 pen  3  . metFORMIN (GLUMETZA) 1000 MG (MOD) 24 hr tablet Take 1 tablet (1,000 mg total) by mouth 2 (two) times daily.  180 tablet  3  . metoprolol (LOPRESSOR) 50 MG tablet Take 1 tablet (50 mg total) by mouth 2 (two) times daily.  180 tablet  3   No current facility-administered medications on file prior to visit.    Allergies: No Known Allergies  History   Social History  . Marital Status: Married    Spouse Name: N/A    Number of Children: 1  . Years of Education: 12   Occupational History  . retired    Social History Main Topics  . Smoking status: Former Smoker    Quit date: 11/11/1972  . Smokeless tobacco: Not on file  . Alcohol Use: No     Comment: quit in 1984  . Drug Use: No  . Sexual Activity: Not on file   Other Topics Concern  . Not on file   Social History Narrative  . No narrative on file    Family History  Problem Relation Age of Onset  . Diabetes Mother   . Heart attack Father   . Heart failure Father   . Diabetes Other   . Diabetes Sister   . Diabetes Brother   . Diabetes Daughter     TYPE ll  . Heart Problems Daughter      Review of Systems:  See HPI for pertinent ROS. All other ROS negative.    Physical Exam: Blood pressure 132/66, pulse 60, temperature 97.7 F (36.5 C), temperature source Oral, resp. rate 18, weight 248 lb (112.492 kg)., Body mass index is 33.63 kg/(m^2). General: Mild abdominal obesity. White male .Appears in no acute  distress. Head: Normocephalic, atraumatic, eyes without discharge, sclera non-icteric, nares are without discharge. Bilateral auditory canals clear, TM's are without perforation, pearly grey and translucent with reflective cone of light bilaterally. Oral cavity moist, posterior pharynx without exudate, erythema, peritonsillar  abscess, or post nasal drip.  Neck: Supple. No thyromegaly. No lymphadenopathy. No carotid bruit. Lungs: Clear bilaterally to auscultation without wheezes, rales, or rhonchi. Breathing is unlabored. Heart: Irregualr.  III-IV/ VI murmur throughout the precordium Abdomen: Soft, non-tender, non-distended with normoactive bowel sounds. No hepatomegaly. No rebound/guarding. No obvious abdominal masses. Musculoskeletal:  Strength and tone normal for age. Extremities/Skin: Warm and dry. No clubbing or cyanosis. No edema. No rashes or suspicious lesions. Neuro: Alert and oriented X 3. Moves all extremities spontaneously. Gait is normal. CNII-XII grossly in tact. Psych:  Responds to questions appropriately with a normal affect. Diabetic foot exam: Inspection is normal. No wounds or calluses. Sensation is normal. 2+ dorsalis pedis pulse on the right. 1+ dorsalis pedis pulse on the left. Trace to 1+ posterior tibial pulses bilaterally.      ASSESSMENT AND PLAN:  72 y.o. year old male with  1. DM type 2 (diabetes mellitus, type 2) - Microalbumin, urine - Hemoglobin A1C, fingerstick He reports that he recently had eye exam. Reports that it showed signs of cataracts but no diabetic macular degeneration. He has very good foot care.  2. Chronic kidney disease Creatinine was 1.50 on lab 05/07/13. We'll monitor this.  3. HYPERLIPIDEMIA-MIXED LDL was at goal at lab 05/07/13. LDL was 76. 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  4. HYPERTENSION, BENIGN Blood pressure is at goal on current medications. CMET was stable at lab  05/07/13.  5. Subclinical hypothyroidism Lab on 05/07/13 showed TSH slightly elevated at 5.470. Free T3-T4 were normal. We'll recheck this in 6 months.  6. Obesity (BMI 30-39.9) Continue diet and exercise.  7. CAD, NATIVE VESSEL Per [facility]  8. Atrial fibrillation Per [facility]  9. Aortic stenosis Per [facility]  10. Sleep apnea with use of continuous positive airway pressure (CPAP) Per Dr. Glenice Laine --pt reports he is compliant and wears CPAP every night.  11. Screening Labs:  He had all of the following labs performed at 05/07/13. CBC, CMET A1C TSH, Free T4 FLP, PSA  I started to discuss preventive care with him today. However then realized that we have not yet received records from his prior PCP. He says he has signed a medical release to get these records. We'll need to follow this up again at his next visit. We'll wait to see if we do obtain those records.  Regular office visit in 3 months.   Signed, [address] [place], [place], [facility] 08/30/2013 11:56 AM

## 2013-08-30 NOTE — Telephone Encounter (Signed)
diab pen needles refilled

## 2013-10-06 ENCOUNTER — Encounter: Payer: Self-pay | Admitting: Family Medicine

## 2013-10-06 DIAGNOSIS — E1136 Type 2 diabetes mellitus with diabetic cataract: Secondary | ICD-10-CM | POA: Insufficient documentation

## 2013-10-06 DIAGNOSIS — E113399 Type 2 diabetes mellitus with moderate nonproliferative diabetic retinopathy without macular edema, unspecified eye: Secondary | ICD-10-CM | POA: Insufficient documentation

## 2013-10-06 DIAGNOSIS — E119 Type 2 diabetes mellitus without complications: Secondary | ICD-10-CM | POA: Insufficient documentation

## 2013-10-12 ENCOUNTER — Ambulatory Visit (HOSPITAL_COMMUNITY): Payer: Medicare Other | Attending: Cardiovascular Disease | Admitting: Radiology

## 2013-10-12 ENCOUNTER — Other Ambulatory Visit (HOSPITAL_COMMUNITY): Payer: Self-pay | Admitting: Cardiovascular Disease

## 2013-10-12 DIAGNOSIS — I350 Nonrheumatic aortic (valve) stenosis: Secondary | ICD-10-CM

## 2013-10-12 DIAGNOSIS — Z87891 Personal history of nicotine dependence: Secondary | ICD-10-CM | POA: Diagnosis not present

## 2013-10-12 DIAGNOSIS — E119 Type 2 diabetes mellitus without complications: Secondary | ICD-10-CM | POA: Diagnosis not present

## 2013-10-12 DIAGNOSIS — N189 Chronic kidney disease, unspecified: Secondary | ICD-10-CM | POA: Insufficient documentation

## 2013-10-12 DIAGNOSIS — I129 Hypertensive chronic kidney disease with stage 1 through stage 4 chronic kidney disease, or unspecified chronic kidney disease: Secondary | ICD-10-CM | POA: Insufficient documentation

## 2013-10-12 DIAGNOSIS — I359 Nonrheumatic aortic valve disorder, unspecified: Secondary | ICD-10-CM | POA: Diagnosis not present

## 2013-10-12 DIAGNOSIS — I2510 Atherosclerotic heart disease of native coronary artery without angina pectoris: Secondary | ICD-10-CM | POA: Diagnosis not present

## 2013-10-12 DIAGNOSIS — I059 Rheumatic mitral valve disease, unspecified: Secondary | ICD-10-CM | POA: Insufficient documentation

## 2013-10-12 DIAGNOSIS — G4733 Obstructive sleep apnea (adult) (pediatric): Secondary | ICD-10-CM | POA: Insufficient documentation

## 2013-10-12 DIAGNOSIS — E785 Hyperlipidemia, unspecified: Secondary | ICD-10-CM | POA: Insufficient documentation

## 2013-10-12 DIAGNOSIS — I4891 Unspecified atrial fibrillation: Secondary | ICD-10-CM | POA: Diagnosis not present

## 2013-10-12 NOTE — Progress Notes (Signed)
Echocardiogram performed.  

## 2013-10-13 ENCOUNTER — Other Ambulatory Visit: Payer: Self-pay | Admitting: *Deleted

## 2013-10-13 DIAGNOSIS — I359 Nonrheumatic aortic valve disorder, unspecified: Secondary | ICD-10-CM

## 2013-10-20 ENCOUNTER — Other Ambulatory Visit: Payer: Self-pay | Admitting: Family Medicine

## 2013-10-20 MED ORDER — FREESTYLE LITE DEVI: 1.0000 | Freq: Two times a day (BID) | Status: AC

## 2013-11-25 ENCOUNTER — Encounter: Payer: Self-pay | Admitting: Cardiovascular Disease

## 2013-11-25 ENCOUNTER — Ambulatory Visit (INDEPENDENT_AMBULATORY_CARE_PROVIDER_SITE_OTHER): Payer: Medicare Other | Admitting: Cardiovascular Disease

## 2013-11-25 VITALS — BP 148/70 | HR 61 | Ht 72.0 in | Wt 246.0 lb

## 2013-11-25 DIAGNOSIS — I2510 Atherosclerotic heart disease of native coronary artery without angina pectoris: Secondary | ICD-10-CM

## 2013-11-25 DIAGNOSIS — I4891 Unspecified atrial fibrillation: Secondary | ICD-10-CM

## 2013-11-25 DIAGNOSIS — I10 Essential (primary) hypertension: Secondary | ICD-10-CM

## 2013-11-25 DIAGNOSIS — I359 Nonrheumatic aortic valve disorder, unspecified: Secondary | ICD-10-CM | POA: Diagnosis not present

## 2013-11-25 DIAGNOSIS — I350 Nonrheumatic aortic (valve) stenosis: Secondary | ICD-10-CM

## 2013-11-25 NOTE — Progress Notes (Signed)
History of Present Illness: 73 yo WM with h/o DM, HTN, hyperlipidemia and CAD here for cardiac follow up. I saw him as a new patient 01/11/11. Prior to his first appt, he woke up while in [place] and felt weak and sweaty. He was found to have atrial fibrillation with RVR. He ruled in for an MI with cardiac enzymes. Cardiac cath with occluded RCA with collaterals. He was hospitalized on 12/27/10 for four days at [facility]. He was started on Pradaxa for anticoagulation. DCCV on 08/02/11 with conversion to sinus rhythm. Echo December 2014 with LVEF=55-60%, moderate aortic stenosis, normal wall motion reported.    He is here today for follow up. He is still in sinus rhythm. Rare palpitations. No chest pain or SOB. He has been walking a mile three days per week. He feels great.   Primary Care Physician: Dena Billet  Last Lipid Profile: Followed in primary care.   Past Medical History  Diagnosis Date  . Atrial fibrillation     patient is on pradaxa  . Other and unspecified hyperlipidemia   . Unspecified essential hypertension   . Coronary artery disease     Cath February 2012 in [place] [facility], occluded RCA with collaterals  . Aortic stenosis     Moderate by echo 8/12  . Obstructive sleep apnea     2012  . DM type 2 (diabetes mellitus, type 2)   . High cholesterol   . Heart disease   . CAD (coronary artery disease)   . Sleep apnea with use of continuous positive airway pressure (CPAP)     04-11-11 AHI was 32.9 and titrated to 15 cm H20, DME is [facility]  . Chronic kidney disease 08/30/2013  . 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 Cataract associated with type 2 diabetes mellitus 10/06/2013    Past Surgical History  Procedure Laterality Date  . Appendectomy  1965  . Carduoversion  07/2011  . Skin cancer excision Bilateral     hand, face, neck    Current Outpatient Prescriptions  Medication Sig Dispense Refill  . aspirin 81 MG tablet Take 1 tablet (81 mg total) by  mouth daily.  30 tablet  0  . atorvastatin (LIPITOR) 80 MG tablet Take 1 tablet (80 mg total) by mouth at bedtime.  90 tablet  3  . BD INSULIN SYRINGE ULTRAFINE 31G X 5/16" 0.5 ML MISC As directed      . Blood Glucose Monitoring Suppl (FREESTYLE LITE) DEVI 1 each by Does not apply route 2 (two) times daily.  1 each  0  . Choline Fenofibrate (TRILIPIX) 135 MG capsule Take 1 capsule (135 mg total) by mouth daily.  90 capsule  3  . dabigatran (PRADAXA) 150 MG CAPS Take 1 capsule (150 mg total) by mouth every 12 (twelve) hours.  180 capsule  3  . docusate sodium (COLACE) 100 MG capsule Take 1 capsule (100 mg total) by mouth as needed.  90 capsule  3  . FREESTYLE LITE test strip       . furosemide (LASIX) 20 MG tablet Take 1 tablet (20 mg total) by mouth daily. Pt unsure of dose.  90 tablet  3  . insulin glargine (LANTUS) 100 UNIT/ML injection Inject 55 Units into the skin as directed.      . insulin lispro (HUMALOG) 100 UNIT/ML KiwkPen Inject 5 Units into the skin 3 (three) times daily.      . Insulin Pen Needle 32G X 4 MM MISC 1  each by Does not apply route 3 (three) times daily before meals.  100 each  11  . metFORMIN (GLUMETZA) 1000 MG (MOD) 24 hr tablet Take 1 tablet (1,000 mg total) by mouth 2 (two) times daily.  180 tablet  3  . metoprolol (LOPRESSOR) 50 MG tablet Take 1 tablet (50 mg total) by mouth 2 (two) times daily.  180 tablet  3   No current facility-administered medications for this visit.    No Known Allergies  History   Social History  . Marital Status: Married    Spouse Name: N/A    Number of Children: 1  . Years of Education: 12   Occupational History  . retired    Social History Main Topics  . Smoking status: Former Smoker    Quit date: 11/11/1972  . Smokeless tobacco: Not on file  . Alcohol Use: No     Comment: quit in 1984  . Drug Use: No  . Sexual Activity: Not on file   Other Topics Concern  . Not on file   Social History Narrative  . No narrative on  file    Family History  Problem Relation Age of Onset  . Diabetes Mother   . Heart attack Father   . Heart failure Father   . Diabetes Other   . Diabetes Sister   . Diabetes Brother   . Diabetes Daughter     TYPE ll  . Heart Problems Daughter     Review of Systems:  As stated in the HPI and otherwise negative.   BP 148/70  Pulse 61  Ht 6' (1.829 m)  Wt 246 lb (111.585 kg)  BMI 33.36 kg/m2  Physical Examination: General: Well developed, well nourished, NAD HEENT: OP clear, mucus membranes moist SKIN: warm, dry. No rashes. Neuro: No focal deficits Musculoskeletal: Muscle strength 5/5 all ext Psychiatric: Mood and affect normal Neck: No JVD, no carotid bruits, no thyromegaly, no lymphadenopathy. Lungs:Clear bilaterally, no wheezes, rhonci, crackles Cardiovascular: Regular rate and rhythm. Systolic murmur, gallops or rubs. Abdomen:Soft. Bowel sounds present. Non-tender.  Extremities: No lower extremity edema. Pulses are 2 + in the bilateral DP/PT.  EKG: NSR, rate 61 bpm. Old inferior infarct. Non-specific ST and T wave abnormalities, unchanged.   Echo: December 2014: Left ventricle: The cavity size was normal. Wall thickness was increased in a pattern of mild LVH. There was mild focal basal hypertrophy of the septum. Systolic function was normal. The estimated ejection fraction was in the range of 55% to 60%. There is hypokinesis of the basal inferior posterior myocardium. Doppler parameters are consistent with abnormal left ventricular relaxation (grade 1 diastolic dysfunction). Doppler parameters are consistent with high ventricular filling pressure. - Aortic valve: Valve mobility was restricted. There was moderate stenosis. - Mitral valve: Moderately calcified annulus. Mild regurgitation. - Left atrium: The atrium was mildly dilated.  Assessment and Plan:   1. CAD: Stable. No changes. BP well controlled. Lipids are followed in primary care per pt and well  controlled.   2. Aortic stenosis: Moderate by echo December 2014. Repeat echo in 2 years.   3. ATRIAL FIBRILLATION: Maintaining NSR. Continue beta blocker. Continue Pradaxa for anti-coagulation.   4. HTN: BP controlled. Continue current therapy.   5. HLD: He is on a statin. Lipids followed in primary care.

## 2013-11-25 NOTE — Patient Instructions (Signed)
Your physician wants you to follow-up in:  6 months. You will receive a reminder letter in the mail two months in advance. If you don't receive a letter, please call our office to schedule the follow-up appointment.   

## 2013-11-29 DIAGNOSIS — H6120 Impacted cerumen, unspecified ear: Secondary | ICD-10-CM | POA: Diagnosis not present

## 2013-12-08 DIAGNOSIS — L570 Actinic keratosis: Secondary | ICD-10-CM | POA: Diagnosis not present

## 2013-12-08 DIAGNOSIS — Z85828 Personal history of other malignant neoplasm of skin: Secondary | ICD-10-CM | POA: Diagnosis not present

## 2013-12-08 DIAGNOSIS — D485 Neoplasm of uncertain behavior of skin: Secondary | ICD-10-CM | POA: Diagnosis not present

## 2013-12-15 DIAGNOSIS — D0460 Carcinoma in situ of skin of unspecified upper limb, including shoulder: Secondary | ICD-10-CM | POA: Diagnosis not present

## 2013-12-27 ENCOUNTER — Ambulatory Visit: Payer: Medicare Other | Admitting: Physician Assistant

## 2014-01-10 ENCOUNTER — Encounter: Payer: Self-pay | Admitting: Physician Assistant

## 2014-01-10 ENCOUNTER — Ambulatory Visit (INDEPENDENT_AMBULATORY_CARE_PROVIDER_SITE_OTHER): Payer: Medicare Other | Admitting: Physician Assistant

## 2014-01-10 VITALS — BP 144/66 | HR 84 | Temp 97.8°F | Resp 18 | Ht 69.75 in | Wt 245.0 lb

## 2014-01-10 DIAGNOSIS — I359 Nonrheumatic aortic valve disorder, unspecified: Secondary | ICD-10-CM

## 2014-01-10 DIAGNOSIS — E1136 Type 2 diabetes mellitus with diabetic cataract: Secondary | ICD-10-CM

## 2014-01-10 DIAGNOSIS — N189 Chronic kidney disease, unspecified: Secondary | ICD-10-CM | POA: Diagnosis not present

## 2014-01-10 DIAGNOSIS — E785 Hyperlipidemia, unspecified: Secondary | ICD-10-CM | POA: Diagnosis not present

## 2014-01-10 DIAGNOSIS — I2510 Atherosclerotic heart disease of native coronary artery without angina pectoris: Secondary | ICD-10-CM

## 2014-01-10 DIAGNOSIS — E669 Obesity, unspecified: Secondary | ICD-10-CM

## 2014-01-10 DIAGNOSIS — I10 Essential (primary) hypertension: Secondary | ICD-10-CM | POA: Diagnosis not present

## 2014-01-10 DIAGNOSIS — I350 Nonrheumatic aortic (valve) stenosis: Secondary | ICD-10-CM

## 2014-01-10 DIAGNOSIS — E119 Type 2 diabetes mellitus without complications: Secondary | ICD-10-CM

## 2014-01-10 DIAGNOSIS — E038 Other specified hypothyroidism: Secondary | ICD-10-CM | POA: Diagnosis not present

## 2014-01-10 DIAGNOSIS — E1139 Type 2 diabetes mellitus with other diabetic ophthalmic complication: Secondary | ICD-10-CM | POA: Diagnosis not present

## 2014-01-10 DIAGNOSIS — E11339 Type 2 diabetes mellitus with moderate nonproliferative diabetic retinopathy without macular edema: Secondary | ICD-10-CM

## 2014-01-10 DIAGNOSIS — G4733 Obstructive sleep apnea (adult) (pediatric): Secondary | ICD-10-CM

## 2014-01-10 DIAGNOSIS — E039 Hypothyroidism, unspecified: Secondary | ICD-10-CM

## 2014-01-10 DIAGNOSIS — G4730 Sleep apnea, unspecified: Secondary | ICD-10-CM

## 2014-01-10 LAB — LIPID PANEL
Cholesterol: 180 mg/dL (ref 0–200)
HDL: 27 mg/dL — ABNORMAL LOW (ref >39)
LDL Cholesterol: 100 mg/dL — ABNORMAL HIGH (ref 0–99)
Total CHOL/HDL Ratio: 6.7 Ratio
Triglycerides: 267 mg/dL — ABNORMAL HIGH (ref <150)
VLDL: 53 mg/dL — ABNORMAL HIGH (ref 0–40)

## 2014-01-10 LAB — COMPLETE METABOLIC PANEL WITH GFR
ALT: 30 U/L (ref 0–53)
AST: 21 U/L (ref 0–37)
Albumin: 4.4 g/dL (ref 3.5–5.2)
Alkaline Phosphatase: 34 U/L — ABNORMAL LOW (ref 39–117)
BUN: 20 mg/dL (ref 6–23)
CO2: 26 mEq/L (ref 19–32)
Calcium: 10.2 mg/dL (ref 8.4–10.5)
Chloride: 100 mEq/L (ref 96–112)
Creat: 1.25 mg/dL (ref 0.50–1.35)
GFR, Est African American: 67 mL/min
GFR, Est Non African American: 58 mL/min — ABNORMAL LOW
Glucose, Bld: 298 mg/dL — ABNORMAL HIGH (ref 70–99)
Potassium: 4.7 mEq/L (ref 3.5–5.3)
Sodium: 138 mEq/L (ref 135–145)
Total Bilirubin: 0.4 mg/dL (ref 0.2–1.2)
Total Protein: 6.9 g/dL (ref 6.0–8.3)

## 2014-01-10 LAB — HEMOGLOBIN A1C
Hgb A1c MFr Bld: 11.8 % — ABNORMAL HIGH (ref <5.7)
Mean Plasma Glucose: 292 mg/dL — ABNORMAL HIGH (ref <117)

## 2014-01-10 LAB — TSH: TSH: 5.042 u[IU]/mL — ABNORMAL HIGH (ref 0.350–4.500)

## 2014-01-10 LAB — T4, FREE: Free T4: 1.25 ng/dL (ref 0.80–1.80)

## 2014-01-10 MED ORDER — LISINOPRIL 10 MG PO TABS: 10.0000 mg | ORAL_TABLET | Freq: Every day | ORAL | Status: DC

## 2014-01-11 NOTE — Progress Notes (Signed)
Patient ID: Steven Maldonado MRN: [id number], DOB: April 12, 1941, 73 y.o. Date of Encounter: @DATE @  Chief Complaint:  Chief Complaint  Patient presents with  . routine check up    recent deaths in family    HPI: 73 y.o. year old white male  presents for routine follow up office visit.  He is quite a talker and continues to talk throughout the whole visit. He is retired from Architect work.  He says that the past month has been "tough for him". And admits that for the past couple months he has not been eating right. Says he's improved his diet some over the past 3 weeks. Thinks that was pointing coming things will get better and his diet and exercise Will return.  Says that his brother recently died in 10/03/23. Has had a lot of family deaths in the past 4 years. His wife has had multiple surgeries in the past year. Therefore he has had to spend quite a bit of time and energy helping care for her but also has been stressed/fatigue secondary to watching her in pain after one of the surgeries.  Did not bring in blood sugar today but says that he knows that they are running higher but promises that he is going to get back on track now. Notes that he has been just eating without even thinking about it. Also no exercise secondary to being so busy with the above issues as well as the snow and ice and cold weather. At his last lab 08/30/13 A1c was 7.6 I recommended that he increase Lantus from 55 to 57. He says that he did not get that message and is still taking 55.  Taking all other medications as directed with no adverse effects. No other complaints today. Discussed whether he feels that he is depressed and needs medication for this but he says that he does not.  His wife has finished her surgeries and is improving. Once the weather improves he can get back outside which will help. As well, he named off multiple happy/positive events that are planned for the upcoming year.-- graduations,  weddings,  and a trip.    Past Medical History  Diagnosis Date  . Atrial fibrillation     patient is on pradaxa  . Other and unspecified hyperlipidemia   . Unspecified essential hypertension   . Coronary artery disease     Cath February 2012 in [facility], occluded RCA with collaterals  . Aortic stenosis     Moderate by echo 8/12  . Obstructive sleep apnea     2012  . DM type 2 (diabetes mellitus, type 2)   . High cholesterol   . Heart disease   . CAD (coronary artery disease)   . Sleep apnea with use of continuous positive airway pressure (CPAP)     04-11-11 AHI was 32.9 and titrated to 15 cm H20, DME is [facility]  . Chronic kidney disease 08/30/2013  . 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 Any medications entered into computer today will not appear on this note's list. The medications listed below were entered prior to today. Current Outpatient Prescriptions on File Prior to Visit  Medication Sig Dispense Refill  . aspirin 81 MG tablet Take 1 tablet (81 mg total) by mouth daily.  30 tablet  0  . atorvastatin (LIPITOR) 80 MG tablet Take 1 tablet (80  mg total) by mouth at bedtime.  90 tablet  3  . BD INSULIN SYRINGE ULTRAFINE 31G X 5/16" 0.5 ML MISC As directed      . Blood Glucose Monitoring Suppl (FREESTYLE LITE) DEVI 1 each by Does not apply route 2 (two) times daily.  1 each  0  . Choline Fenofibrate (TRILIPIX) 135 MG capsule Take 1 capsule (135 mg total) by mouth daily.  90 capsule  3  . dabigatran (PRADAXA) 150 MG CAPS Take 1 capsule (150 mg total) by mouth every 12 (twelve) hours.  180 capsule  3  . docusate sodium (COLACE) 100 MG capsule Take 1 capsule (100 mg total) by mouth as needed.  90 capsule  3  . furosemide (LASIX) 20 MG tablet Take 1 tablet (20 mg total) by mouth daily. Pt unsure of dose.  90 tablet  3  . insulin glargine (LANTUS)  100 UNIT/ML injection Inject 55 Units into the skin as directed.      . insulin lispro (HUMALOG) 100 UNIT/ML KiwkPen Inject 5 Units into the skin 3 (three) times daily.      . Insulin Pen Needle 32G X 4 MM MISC 1 each by Does not apply route 3 (three) times daily before meals.  100 each  11  . metFORMIN (GLUMETZA) 1000 MG (MOD) 24 hr tablet Take 1 tablet (1,000 mg total) by mouth 2 (two) times daily.  180 tablet  3  . metoprolol (LOPRESSOR) 50 MG tablet Take 1 tablet (50 mg total) by mouth 2 (two) times daily.  180 tablet  3  . FREESTYLE LITE test strip        No current facility-administered medications on file prior to visit.    Allergies: No Known Allergies  History   Social History  . Marital Status: Married    Spouse Name: N/A    Number of Children: 1  . Years of Education: 12   Occupational History  . retired    Social History Main Topics  . Smoking status: Former Smoker    Quit date: 11/11/1972  . Smokeless tobacco: Never Used  . Alcohol Use: No     Comment: quit in 1984  . Drug Use: No  . Sexual Activity: Not on file   Other Topics Concern  . Not on file   Social History Narrative  . No narrative on file    Family History  Problem Relation Age of Onset  . Diabetes Mother   . Heart attack Father   . Heart failure Father   . Diabetes Other   . Diabetes Sister   . Diabetes Brother   . Diabetes Daughter     TYPE ll  . Heart Problems Daughter      Review of Systems:  See HPI for pertinent ROS. All other ROS negative.    Physical Exam: Blood pressure 144/66, pulse 84, temperature 97.8 F (36.6 C), temperature source Oral, resp. rate 18, height 5' 9.75" (1.772 m), weight 245 lb (111.131 kg)., Body mass index is 35.39 kg/(m^2). General: Mild/moderate obese white male .Appears in no acute distress. Neck: Supple. No thyromegaly. No lymphadenopathy. No carotid bruits. Lungs: Clear bilaterally to auscultation without wheezes, rales, or rhonchi. Breathing is  unlabored. Heart: Irreg III-IV/VI murmur heard throughout precordium. Abdomen: Soft, non-tender, non-distended with normoactive bowel sounds. No hepatomegaly. No rebound/guarding. No obvious abdominal masses. Musculoskeletal:  Strength and tone normal for age. Extremities/Skin: Warm and dry.. No edema.  Neuro: Alert and oriented X 3. Moves all extremities  spontaneously. Gait is normal. CNII-XII grossly in tact. Psych:  Responds to questions appropriately with a normal affect. Diabetic foot exam: Inspection is normal. No wounds or calluses. Sensation is intact. 2+ dorsalis pedis pulse on the right.  1+ dorsalis pedis pulse on the left. Trace to 1+ posterior tibial pulses bilaterally.      ASSESSMENT AND PLAN:  73 y.o. year old male with  1. CAD Sees cardiology on a routine basis.  2. Aortic stenosis See #1  3. A. Fib--per Cardiology  3. DM type 2 (diabetes mellitus, type 2) - COMPLETE METABOLIC PANEL WITH GFR - Hemoglobin A1c - lisinopril (PRINIVIL,ZESTRIL) 10 MG tablet; Take 1 tablet (10 mg total) by mouth daily.  Dispense: 30 tablet; Refill: 3 - BASIC METABOLIC PANEL WITH GFR; Future  Micro albumin done 08/30/13 normal  He is currently on Lantus 55 units. Says that he did not get the message from the labs 08/31/39 and increases to 57. Also uses Humalog 5 units 3 times a day with meals.  He is not on an ACE inhibitor. I just started seeing him as a new patient to our office 05/06/13. He was on no ACE inhibitor when he came here. Asked him today whether he thinks he has had any adverse effects with these medicines in the past and he says no- as far as he can recall. Agreeable to add ACE inhibitor now. He is on statin. He is on aspirin 81 mg daily He has very good foot care. He actually states that he just gets pedicures occasionally. He is very careful with his  Feet. Does have routine ophthalmic exam. He recently got a report from Ophthalmologist-  Which states--3 #4 and #5 and #6  below  4. Type II or unspecified type diabetes mellitus with ophthalmic manifestations, not stated as uncontrolled Per Ophthalmology 5. 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 Chronic kidney disease - COMPLETE METABOLIC PANEL WITH GFR - lisinopril (PRINIVIL,ZESTRIL) 10 MG tablet; Take 1 tablet (10 mg total) by mouth daily.  Dispense: 30 tablet; Refill: 3 - BASIC METABOLIC PANEL WITH GFR; Future  8. HYPERTENSION, BENIGN - COMPLETE METABOLIC PANEL WITH GFR - lisinopril (PRINIVIL,ZESTRIL) 10 MG tablet; Take 1 tablet (10 mg total) by mouth daily.  Dispense: 30 tablet; Refill: 3 - BASIC METABOLIC PANEL WITH GFR; Future--he is informed to come in to check BMET in 2 weeks after adding the lisinopril.  9. HYPERLIPIDEMIA-MIXED On Lipitor 80. - COMPLETE METABOLIC PANEL WITH GFR - Lipid panel  10. Obstructive sleep apnea He wears CPAP. Management Dr. Pasty Spillers  11. Sleep apnea with use of continuous positive airway pressure (CPAP) See # 10  12. Obesity (BMI 30-39.9) See history of present illness  13. Subclinical hypothyroidism - TSH - T4, free  14. Screening PSA was done on 05/07/13  Regular office visit 3 months or sooner if needed.  [address] [place], [place], [facility] 01/11/2014 5:47 AM

## 2014-01-12 ENCOUNTER — Telehealth: Payer: Self-pay | Admitting: Family Medicine

## 2014-01-12 NOTE — Telephone Encounter (Signed)
Left message to call back.  Insulin adjustment made on medication list

## 2014-01-12 NOTE — Telephone Encounter (Signed)
Message copied by Olena Mater on Wed Jan 12, 2014 12:54 PM ------      Message from: Dena Billet      Created: Tue Jan 11, 2014  6:25 AM       At his office visit yesterday, patient reported that the last months have been very stressful and he gotten off track with his diet and exercise.      In the past his A1c had been very high similar to today's reading. However at last check 08/30/13 A1c was down to 7.6.      Yesterday's visit he already promised that he would get back to be being compliant with his diet and exercise.      He'll to need to increase his insulin in the meantime      Lantus is currently at 55 units. Have him increase this to 60.      He also gives a mealtime insulin  Of 5 units 3 times a day with meals. --If he promises to decrease his carbohydrate intake IMMEDIATELY then he can keep this at a 5.      Other labs are stable.( LDL should go back down with diet changes) ------

## 2014-01-14 NOTE — Telephone Encounter (Signed)
Spoke to patient about lab results.  Aware of insulin dose adjustment.  Has follow up appt for three months

## 2014-01-24 ENCOUNTER — Other Ambulatory Visit: Payer: Medicare Other

## 2014-01-24 ENCOUNTER — Ambulatory Visit: Payer: Medicare Other | Admitting: Physician Assistant

## 2014-01-24 DIAGNOSIS — E119 Type 2 diabetes mellitus without complications: Secondary | ICD-10-CM | POA: Diagnosis not present

## 2014-01-24 DIAGNOSIS — N189 Chronic kidney disease, unspecified: Secondary | ICD-10-CM | POA: Diagnosis not present

## 2014-01-24 DIAGNOSIS — I10 Essential (primary) hypertension: Secondary | ICD-10-CM

## 2014-01-24 LAB — BASIC METABOLIC PANEL WITH GFR
BUN: 18 mg/dL (ref 6–23)
CO2: 27 mEq/L (ref 19–32)
Calcium: 10 mg/dL (ref 8.4–10.5)
Chloride: 100 mEq/L (ref 96–112)
Creat: 1.38 mg/dL — ABNORMAL HIGH (ref 0.50–1.35)
GFR, Est African American: 59 mL/min — ABNORMAL LOW
GFR, Est Non African American: 51 mL/min — ABNORMAL LOW
Glucose, Bld: 294 mg/dL — ABNORMAL HIGH (ref 70–99)
Potassium: 4.7 mEq/L (ref 3.5–5.3)
Sodium: 139 mEq/L (ref 135–145)

## 2014-02-04 ENCOUNTER — Telehealth: Payer: Self-pay | Admitting: Family Medicine

## 2014-02-04 MED ORDER — "INSULIN SYRINGE-NEEDLE U-100 31G X 5/16"" 1 ML MISC": 1.0000 | Freq: Every day | Status: DC

## 2014-02-04 NOTE — Telephone Encounter (Signed)
Pt called fro refill of his insulin syringes.  Needs 80ml size.  Refill faxed to mail order pharmacy.

## 2014-02-22 ENCOUNTER — Other Ambulatory Visit: Payer: Self-pay | Admitting: Cardiovascular Disease

## 2014-04-13 DIAGNOSIS — L570 Actinic keratosis: Secondary | ICD-10-CM | POA: Diagnosis not present

## 2014-04-13 DIAGNOSIS — Z85828 Personal history of other malignant neoplasm of skin: Secondary | ICD-10-CM | POA: Diagnosis not present

## 2014-04-15 ENCOUNTER — Encounter: Payer: Self-pay | Admitting: *Deleted

## 2014-04-15 ENCOUNTER — Encounter: Payer: Self-pay | Admitting: Physician Assistant

## 2014-04-15 ENCOUNTER — Ambulatory Visit (INDEPENDENT_AMBULATORY_CARE_PROVIDER_SITE_OTHER): Payer: Medicare Other | Admitting: Physician Assistant

## 2014-04-15 VITALS — BP 128/76 | HR 99 | Ht 71.0 in | Wt 251.1 lb

## 2014-04-15 DIAGNOSIS — I2510 Atherosclerotic heart disease of native coronary artery without angina pectoris: Secondary | ICD-10-CM | POA: Diagnosis not present

## 2014-04-15 DIAGNOSIS — I4891 Unspecified atrial fibrillation: Secondary | ICD-10-CM | POA: Diagnosis not present

## 2014-04-15 DIAGNOSIS — I10 Essential (primary) hypertension: Secondary | ICD-10-CM

## 2014-04-15 DIAGNOSIS — I359 Nonrheumatic aortic valve disorder, unspecified: Secondary | ICD-10-CM | POA: Diagnosis not present

## 2014-04-15 DIAGNOSIS — I350 Nonrheumatic aortic (valve) stenosis: Secondary | ICD-10-CM

## 2014-04-15 DIAGNOSIS — E785 Hyperlipidemia, unspecified: Secondary | ICD-10-CM

## 2014-04-15 NOTE — Progress Notes (Signed)
Cardiology Office Note   Date:  04/15/2014   ID:  Steven Maldonado, DOB 11/04/1941, MRN [id number]  PCP:  [name], [name]  Cardiologist:  Dr. [name]      History of Present Illness: Steven Maldonado is a 73 y.o. male with a hx of DM, HTN, hyperlipidemia and CAD.  He is s/p NSTEMI in the setting of atrial fibrillation with RVR in 12/2010.  Cardiac cath with occluded RCA with collaterals. He was hospitalized on 12/27/10 for four days at [facility]. He was started on Pradaxa for anticoagulation. He underwent DCCV on 08/02/11 at [facility] with conversion to sinus rhythm. Echo in December 2014 with LVEF=55-60%, moderate aortic stenosis, normal wall motion reported.  Last seen by Dr. [name] 11/2013.  He returns for follow up.    Steven Maldonado has recently noted worsening DOE.  He is now NYHA 2b-3.  He has had to cut his daily walks short due to DOE.  He felt like he might be back in AFib.  He denies chest pain, weakness or diaphoresis like he had with his NSTEMI 3 years ago.  He denies orthopnea, PND, edema.  He denies syncope.    Studies:  - Echo (10/2013):  Mild LVH. Mild focal basal hypertrophy of the septum. EF 55% to 60%. Inferior posterior HK.  Grade 1 diastolic dysfunction.  Moderate AS (mean 25 mmHg).  MAC.  Mild MR.  Mild LAE.      Recent Labs: 05/06/2013: Hemoglobin 13.1  01/10/2014: ALT 30; HDL Cholesterol by NMR 27*; LDL (calc) 100*; TSH 5.042*  01/24/2014: Creatinine 1.38*; Potassium 4.7   Wt Readings from Last 3 Encounters:  04/15/14 251 lb 1.9 oz (113.907 kg)  01/10/14 245 lb (111.131 kg)  11/25/13 246 lb (111.585 kg)     Past Medical History  Diagnosis Date  . Atrial fibrillation     patient is on pradaxa  . Other and unspecified hyperlipidemia   . Unspecified essential hypertension   . Coronary artery disease     Cath February 2012 in [place], occluded RCA with collaterals  . Aortic stenosis     Moderate by echo 8/12  .  Obstructive sleep apnea     2012  . DM type 2 (diabetes mellitus, type 2)   . High cholesterol   . Heart disease   . CAD (coronary artery disease)   . Sleep apnea with use of continuous positive airway pressure (CPAP)     04-11-11 AHI was 32.9 and titrated to 15 cm H20, DME is [facility]  . Chronic kidney disease 08/30/2013  . 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 aspirin 81 MG tablet Take 1 tablet (81 mg total) by mouth daily.  30 tablet  0  . atorvastatin (LIPITOR) 80 MG tablet Take 1 tablet (80 mg total) by mouth at bedtime.  90 tablet  3  . Blood Glucose Monitoring Suppl (FREESTYLE LITE) DEVI 1 each by Does not apply route 2 (two) times daily.  1 each  0  . Choline Fenofibrate (TRILIPIX) 135 MG capsule Take 1 capsule (135 mg total) by mouth daily.  90 capsule  3  . dabigatran (PRADAXA) 150 MG CAPS Take 1 capsule (150 mg total) by mouth every 12 (twelve) hours.  180 capsule  3  . docusate sodium (COLACE) 100 MG capsule Take 1 capsule (100 mg total) by  mouth as needed.  90 capsule  3  . FREESTYLE LITE test strip       . furosemide (LASIX) 20 MG tablet Take 1 tablet (20 mg total) by mouth daily. Pt unsure of dose.  90 tablet  3  . insulin glargine (LANTUS) 100 UNIT/ML injection Inject 60 Units into the skin as directed.       . insulin lispro (HUMALOG) 100 UNIT/ML KiwkPen Inject 5 Units into the skin 3 (three) times daily.      . Insulin Pen Needle 32G X 4 MM MISC 1 each by Does not apply route 3 (three) times daily before meals.  100 each  11  . Insulin Syringe-Needle U-100 (BD INSULIN SYRINGE ULTRAFINE) 31G X 5/16" 1 ML MISC 1 Syringe by Does not apply route daily.  100 each  3  . lisinopril (PRINIVIL,ZESTRIL) 10 MG tablet Take 1 tablet (10 mg total) by mouth daily.  30 tablet  3  . metFORMIN (GLUMETZA) 1000 MG (MOD) 24 hr tablet Take 1 tablet (1,000 mg total)  by mouth 2 (two) times daily.  180 tablet  3  . metoprolol (LOPRESSOR) 50 MG tablet Take 1 tablet (50 mg total) by mouth 2 (two) times daily.  180 tablet  3   No current facility-administered medications for this visit.    Allergies:   Review of patient's allergies indicates no known allergies.   Social History:  The patient  reports that he quit smoking about 41 years ago. He has never used smokeless tobacco. He reports that he does not drink alcohol or use illicit drugs.   Family History:  The patient's family history includes Diabetes in his brother, daughter, mother, other, and sister; Heart Problems in his daughter; Heart attack in his father; Heart failure in his father.   ROS:  Please see the history of present illness.      All other systems reviewed and negative.   PHYSICAL EXAM: VS:  BP 128/76  Pulse 99  Ht 5\' 11"  (1.803 m)  Wt 251 lb 1.9 oz (113.907 kg)  BMI 35.04 kg/m2 Well nourished, well developed, in no acute distress HEENT: normal Neck: no JVD Cardiac:  normal S1, S2; irregularly irregular rhythm; 2/6 harsh systolic murmurat the RUSB Lungs:  clear to auscultation bilaterally, no wheezing, rhonchi or rales Abd: soft, nontender, no hepatomegaly Ext:  Trace bilateral LE edema Skin: warm and dry Neuro:  CNs 2-12 intact, no focal abnormalities noted  EKG:  AFib, HR 99, inf Q waves, lat TWI     ASSESSMENT AND PLAN:  1. Atrial fibrillation:  He is back in AFib.  He is symptomatic with worsening DOE.  He does not look volume overloaded on exam.  He has been compliant with his Pradaxa and has not missed a dose since he started it 3 years ago.  I reviewed this with Dr. [name].  Will plan DCCV next week.  The patient agrees to proceed.  If he does not remain in NSR, consider anti-arrhythmic drug therapy (Amiodarone).   2. Coronary atherosclerosis of native coronary artery:  No angina.  Continue ASA, statin.  3. Aortic valve stenosis:  Stable by last echo.   Continue to monitor.  4. HTN (hypertension):  Controlled.   5. HYPERLIPIDEMIA-MIXED:  Continue statin. 6. Disposition:  F/u with me 1-2 weeks after DCCV.   Signed, Versie Starks, MHS 04/15/2014 11:51 AM    [facility] [place], [place], [place]  [address] Phone: [phone]; Fax: [phone]

## 2014-04-15 NOTE — H&P (Signed)
History and Physical   Date:  04/15/2014   ID:  Steven Maldonado, DOB 11/05/1941, MRN [id number]  PCP:  Steven Juba, [name]  Cardiologist:  Dr. Lauree Maldonado      History of Present Illness: Steven Maldonado is a 73 y.o. male with a hx of DM, HTN, hyperlipidemia and CAD.  He is s/p NSTEMI in the setting of atrial fibrillation with RVR in 12/2010.  Cardiac cath with occluded RCA with collaterals. He was hospitalized on 12/27/10 for four days at [facility]. He was started on Pradaxa for anticoagulation. He underwent DCCV on 08/02/11 at [facility] with conversion to sinus rhythm. Echo in December 2014 with LVEF=55-60%, moderate aortic stenosis, normal wall motion reported.  Last seen by Dr. Lauree Maldonado 11/2013.  He returns for follow up.    [name] has recently noted worsening DOE.  He is now NYHA 2b-3.  He has had to cut his daily walks short due to DOE.  He felt like he might be back in AFib.  He denies chest pain, weakness or diaphoresis like he had with his NSTEMI 3 years ago.  He denies orthopnea, PND, edema.  He denies syncope.    Studies:  - Echo (10/2013):  Mild LVH. Mild focal basal hypertrophy of the septum. EF 55% to 60%. Inferior posterior HK.  Grade 1 diastolic dysfunction.  Moderate AS (mean 25 mmHg).  MAC.  Mild MR.  Mild LAE.      Recent Labs: 05/06/2013: Hemoglobin 13.1  01/10/2014: ALT 30; HDL Cholesterol by NMR 27*; LDL (calc) 100*; TSH 5.042*  01/24/2014: Creatinine 1.38*; Potassium 4.7   Wt Readings from Last 3 Encounters:  04/15/14 251 lb 1.9 oz (113.907 kg)  01/10/14 245 lb (111.131 kg)  11/25/13 246 lb (111.585 kg)     Past Medical History  Diagnosis Date  . Atrial fibrillation     patient is on pradaxa  . Other and unspecified hyperlipidemia   . Unspecified essential hypertension   . Coronary artery disease     Cath February 2012 in [place] [facility], occluded RCA with collaterals  . Aortic stenosis     Moderate by echo 8/12  .  Obstructive sleep apnea     2012  . DM type 2 (diabetes mellitus, type 2)   . High cholesterol   . Heart disease   . CAD (coronary artery disease)   . Sleep apnea with use of continuous positive airway pressure (CPAP)     04-11-11 AHI was 32.9 and titrated to 15 cm H20, DME is [facility]  . Chronic kidney disease 08/30/2013  . 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 aspirin 81 MG tablet Take 1 tablet (81 mg total) by mouth daily.  30 tablet  0  . atorvastatin (LIPITOR) 80 MG tablet Take 1 tablet (80 mg total) by mouth at bedtime.  90 tablet  3  . Blood Glucose Monitoring Suppl (FREESTYLE LITE) DEVI 1 each by Does not apply route 2 (two) times daily.  1 each  0  . Choline Fenofibrate (TRILIPIX) 135 MG capsule Take 1 capsule (135 mg total) by mouth daily.  90 capsule  3  . dabigatran (PRADAXA) 150 MG CAPS Take 1 capsule (150 mg total) by mouth every 12 (twelve) hours.  180 capsule  3  . docusate sodium (COLACE) 100 MG capsule Take 1 capsule (100 mg total)  by mouth as needed.  90 capsule  3  . FREESTYLE LITE test strip       . furosemide (LASIX) 20 MG tablet Take 1 tablet (20 mg total) by mouth daily. Pt unsure of dose.  90 tablet  3  . insulin glargine (LANTUS) 100 UNIT/ML injection Inject 60 Units into the skin as directed.       . insulin lispro (HUMALOG) 100 UNIT/ML KiwkPen Inject 5 Units into the skin 3 (three) times daily.      . Insulin Pen Needle 32G X 4 MM MISC 1 each by Does not apply route 3 (three) times daily before meals.  100 each  11  . Insulin Syringe-Needle U-100 (BD INSULIN SYRINGE ULTRAFINE) 31G X 5/16" 1 ML MISC 1 Syringe by Does not apply route daily.  100 each  3  . lisinopril (PRINIVIL,ZESTRIL) 10 MG tablet Take 1 tablet (10 mg total) by mouth daily.  30 tablet  3  . metFORMIN (GLUMETZA) 1000 MG (MOD) 24 hr tablet Take 1 tablet (1,000 mg total)  by mouth 2 (two) times daily.  180 tablet  3  . metoprolol (LOPRESSOR) 50 MG tablet Take 1 tablet (50 mg total) by mouth 2 (two) times daily.  180 tablet  3   No current facility-administered medications for this visit.    Allergies:   Review of patient's allergies indicates no known allergies.   Social History:  The patient  reports that he quit smoking about 41 years ago. He has never used smokeless tobacco. He reports that he does not drink alcohol or use illicit drugs.   Family History:  The patient's family history includes Diabetes in his brother, daughter, mother, other, and sister; Heart Problems in his daughter; Heart attack in his father; Heart failure in his father.   ROS:  Please see the history of present illness.      All other systems reviewed and negative.   PHYSICAL EXAM: VS:  BP 128/76  Pulse 99  Ht 5\' 11"  (1.803 m)  Wt 251 lb 1.9 oz (113.907 kg)  BMI 35.04 kg/m2 Well nourished, well developed, in no acute distress HEENT: normal Neck: no JVD Cardiac:  normal S1, S2; irregularly irregular rhythm; 2/6 harsh systolic murmurat the RUSB Lungs:  clear to auscultation bilaterally, no wheezing, rhonchi or rales Abd: soft, nontender, no hepatomegaly Ext:  Trace bilateral LE edema Skin: warm and dry Neuro:  CNs 2-12 intact, no focal abnormalities noted  EKG:  AFib, HR 99, inf Q waves, lat TWI     ASSESSMENT AND PLAN:  1. Atrial fibrillation:  He is back in AFib.  He is symptomatic with worsening DOE.  He does not look volume overloaded on exam.  He has been compliant with his Pradaxa and has not missed a dose since he started it 3 years ago.  I reviewed this with Dr. Lauree Maldonado.  Will plan DCCV next week.  The patient agrees to proceed.  If he does not remain in NSR, consider anti-arrhythmic drug therapy (Amiodarone).   2. Coronary atherosclerosis of native coronary artery:  No angina.  Continue ASA, statin.  3. Aortic valve stenosis:  Stable by last echo.   Continue to monitor.  4. HTN (hypertension):  Controlled.   5. HYPERLIPIDEMIA-MIXED:  Continue statin. 6. Disposition:  F/u with me 1-2 weeks after DCCV.   Signed, Versie Starks, MHS 04/15/2014 11:51 AM    [facility] [place], [place], [place]  [address]  Phone: [phone]; Fax: [phone]    I have dicussed this patient with Richardson Dopp, [name].  I agree with the assessment and plan as outlined above.   Burnell Blanks 04/15/2014 12:50 PM

## 2014-04-15 NOTE — Patient Instructions (Signed)
Your physician has recommended that you have a Cardioversion (DCCV). Electrical Cardioversion uses a jolt of electricity to your heart either through paddles or wired patches attached to your chest. This is a controlled, usually prescheduled, procedure. Defibrillation is done under light anesthesia in the hospital, and you usually go home the day of the procedure. This is done to get your heart back into a normal rhythm. You are not awake for the procedure. Please see the instruction sheet given to you today.  Your physician recommends that you return for lab work in: TODAY BMET, CBC W/DIFF, PT/INR  Your physician recommends that you schedule a follow-up appointment in: [place], [place]

## 2014-04-16 ENCOUNTER — Other Ambulatory Visit: Payer: Self-pay | Admitting: Physician Assistant

## 2014-04-18 ENCOUNTER — Ambulatory Visit: Payer: Medicare Other | Admitting: Physician Assistant

## 2014-04-18 ENCOUNTER — Telehealth: Payer: Self-pay | Admitting: Physician Assistant

## 2014-04-18 ENCOUNTER — Encounter (HOSPITAL_COMMUNITY): Payer: Medicare Other | Admitting: Certified Registered Nurse Anesthetist

## 2014-04-18 ENCOUNTER — Ambulatory Visit (HOSPITAL_COMMUNITY): Payer: Medicare Other | Admitting: Certified Registered Nurse Anesthetist

## 2014-04-18 ENCOUNTER — Encounter (HOSPITAL_COMMUNITY): Payer: Self-pay | Admitting: Certified Registered Nurse Anesthetist

## 2014-04-18 ENCOUNTER — Ambulatory Visit (HOSPITAL_COMMUNITY)
Admission: RE | Admit: 2014-04-18 | Discharge: 2014-04-18 | Disposition: A | Discharge status: Home / Self Care | Payer: Medicare Other | Source: Ambulatory Visit | Attending: Cardiology | Admitting: Cardiology

## 2014-04-18 ENCOUNTER — Encounter (HOSPITAL_COMMUNITY)
Admission: RE | Disposition: A | Discharge status: Home / Self Care | Payer: Self-pay | Source: Ambulatory Visit | Attending: Cardiology

## 2014-04-18 DIAGNOSIS — G4733 Obstructive sleep apnea (adult) (pediatric): Secondary | ICD-10-CM | POA: Insufficient documentation

## 2014-04-18 DIAGNOSIS — N189 Chronic kidney disease, unspecified: Secondary | ICD-10-CM | POA: Diagnosis not present

## 2014-04-18 DIAGNOSIS — E1139 Type 2 diabetes mellitus with other diabetic ophthalmic complication: Secondary | ICD-10-CM | POA: Diagnosis not present

## 2014-04-18 DIAGNOSIS — I252 Old myocardial infarction: Secondary | ICD-10-CM | POA: Insufficient documentation

## 2014-04-18 DIAGNOSIS — Z7982 Long term (current) use of aspirin: Secondary | ICD-10-CM | POA: Diagnosis not present

## 2014-04-18 DIAGNOSIS — I2510 Atherosclerotic heart disease of native coronary artery without angina pectoris: Secondary | ICD-10-CM | POA: Diagnosis not present

## 2014-04-18 DIAGNOSIS — I4891 Unspecified atrial fibrillation: Secondary | ICD-10-CM | POA: Insufficient documentation

## 2014-04-18 DIAGNOSIS — E785 Hyperlipidemia, unspecified: Secondary | ICD-10-CM | POA: Diagnosis not present

## 2014-04-18 DIAGNOSIS — E1165 Type 2 diabetes mellitus with hyperglycemia: Principal | ICD-10-CM

## 2014-04-18 DIAGNOSIS — I129 Hypertensive chronic kidney disease with stage 1 through stage 4 chronic kidney disease, or unspecified chronic kidney disease: Secondary | ICD-10-CM | POA: Insufficient documentation

## 2014-04-18 DIAGNOSIS — I509 Heart failure, unspecified: Secondary | ICD-10-CM | POA: Insufficient documentation

## 2014-04-18 DIAGNOSIS — E1136 Type 2 diabetes mellitus with diabetic cataract: Secondary | ICD-10-CM | POA: Insufficient documentation

## 2014-04-18 DIAGNOSIS — Z6835 Body mass index (BMI) 35.0-35.9, adult: Secondary | ICD-10-CM | POA: Insufficient documentation

## 2014-04-18 DIAGNOSIS — I359 Nonrheumatic aortic valve disorder, unspecified: Secondary | ICD-10-CM | POA: Diagnosis not present

## 2014-04-18 DIAGNOSIS — Z87891 Personal history of nicotine dependence: Secondary | ICD-10-CM | POA: Insufficient documentation

## 2014-04-18 DIAGNOSIS — IMO0001 Reserved for inherently not codable concepts without codable children: Secondary | ICD-10-CM

## 2014-04-18 DIAGNOSIS — E039 Hypothyroidism, unspecified: Secondary | ICD-10-CM | POA: Insufficient documentation

## 2014-04-18 DIAGNOSIS — Z794 Long term (current) use of insulin: Secondary | ICD-10-CM | POA: Diagnosis not present

## 2014-04-18 HISTORY — PX: CARDIOVERSION: SHX1299

## 2014-04-18 LAB — BASIC METABOLIC PANEL
BUN: 21 mg/dL (ref 6–23)
CO2: 22 mEq/L (ref 19–32)
Calcium: 9.6 mg/dL (ref 8.4–10.5)
Chloride: 102 mEq/L (ref 96–112)
Creatinine, Ser: 1.36 mg/dL — ABNORMAL HIGH (ref 0.50–1.35)
GFR calc Af Amer: 59 mL/min — ABNORMAL LOW (ref >90)
GFR calc non Af Amer: 51 mL/min — ABNORMAL LOW (ref >90)
Glucose, Bld: 363 mg/dL — ABNORMAL HIGH (ref 70–99)
Potassium: 4.9 mEq/L (ref 3.7–5.3)
Sodium: 138 mEq/L (ref 137–147)

## 2014-04-18 LAB — GLUCOSE, CAPILLARY
Glucose-Capillary: 333 mg/dL — ABNORMAL HIGH (ref 70–99)
Glucose-Capillary: 335 mg/dL — ABNORMAL HIGH (ref 70–99)

## 2014-04-18 SURGERY — CARDIOVERSION
Anesthesia: Monitor Anesthesia Care

## 2014-04-18 MED ORDER — INSULIN REGULAR HUMAN 100 UNIT/ML IJ SOLN
INTRAMUSCULAR | Status: DC | PRN
  Administered 2014-04-18: 10 [IU] via SUBCUTANEOUS

## 2014-04-18 MED ORDER — SODIUM CHLORIDE 0.9 % IV SOLN: INTRAVENOUS | Status: DC

## 2014-04-18 MED ORDER — GLUCOSE BLOOD VI STRP: 1.0000 | ORAL_STRIP | Freq: Three times a day (TID) | Status: DC

## 2014-04-18 MED ORDER — PROPOFOL 10 MG/ML IV BOLUS
INTRAVENOUS | Status: DC | PRN
  Administered 2014-04-18: 20 mg via INTRAVENOUS
  Administered 2014-04-18: 30 mg via INTRAVENOUS
  Administered 2014-04-18: 40 mg via INTRAVENOUS

## 2014-04-18 MED ORDER — SODIUM CHLORIDE 0.9 % IV SOLN
INTRAVENOUS | Status: DC | PRN
  Administered 2014-04-18: 11:00:00 via INTRAVENOUS

## 2014-04-18 NOTE — Telephone Encounter (Signed)
Patient needs refill on glucose test strips [facility] [address]

## 2014-04-18 NOTE — Anesthesia Preprocedure Evaluation (Signed)
Anesthesia Evaluation  Patient identified by MRN, date of birth, ID band Patient awake    Reviewed: Allergy & Precautions, H&P , NPO status , Patient's Chart, lab work & pertinent test results  History of Anesthesia Complications Negative for: history of anesthetic complications  Airway Mallampati: IV TM Distance: >3 FB Neck ROM: Full    Dental  (+) Upper Dentures, Lower Dentures   Pulmonary sleep apnea and Continuous Positive Airway Pressure Ventilation , neg COPDformer smoker,  breath sounds clear to auscultation        Cardiovascular hypertension, Pt. on medications - angina+ CAD, + Past MI and + DOE - CHF + dysrhythmias Atrial Fibrillation Rhythm:Irregular Rate:Normal + Systolic murmurs    Neuro/Psych negative neurological ROS  negative psych ROS   GI/Hepatic Neg liver ROS, indigestion occasionally   Endo/Other  diabetes, Poorly Controlled, Type 2, Insulin DependentHypothyroidism Morbid obesity  Renal/GU Renal InsufficiencyRenal disease     Musculoskeletal   Abdominal   Peds  Hematology On pradaxa   Anesthesia Other Findings   Reproductive/Obstetrics                           Anesthesia Physical Anesthesia Plan  ASA: III  Anesthesia Plan: MAC   Post-op Pain Management:    Induction: Intravenous  Airway Management Planned: Mask  Additional Equipment: None  Intra-op Plan:   Post-operative Plan:   Informed Consent: I have reviewed the patients History and Physical, chart, labs and discussed the procedure including the risks, benefits and alternatives for the proposed anesthesia with the patient or authorized representative who has indicated his/her understanding and acceptance.     Plan Discussed with: CRNA and Surgeon  Anesthesia Plan Comments:         Anesthesia Quick Evaluation

## 2014-04-18 NOTE — Transfer of Care (Signed)
Immediate Anesthesia Transfer of Care Note  Patient: Steven Maldonado  Procedure(s) Performed: Procedure(s): CARDIOVERSION (N/A)  Patient Location: Endoscopy Unit  Anesthesia Type:General  Level of Consciousness: awake, alert  and oriented  Airway & Oxygen Therapy: Patient Spontanous Breathing and Patient connected to nasal cannula oxygen  Post-op Assessment: Report given to PACU RN and Post -op Vital signs reviewed and stable  Post vital signs: Reviewed and stable  Complications: No apparent anesthesia complications

## 2014-04-18 NOTE — Progress Notes (Signed)
Late entry:  Blood sugar 335- Dr Ermalene Postin made aware, no new order made, instructed patient to take medication insulin and check blood sugar once he reached home and  regularly and if blood sugar remains high to call primary physician.

## 2014-04-18 NOTE — Anesthesia Postprocedure Evaluation (Signed)
  Anesthesia Post-op Note  Patient: Steven Maldonado  Procedure(s) Performed: Procedure(s): CARDIOVERSION (N/A)  Patient Location: Endoscopy Unit  Anesthesia Type:General  Level of Consciousness: awake, alert  and oriented  Airway and Oxygen Therapy: Patient Spontanous Breathing and Patient connected to nasal cannula oxygen  Post-op Pain: none  Post-op Assessment: Post-op Vital signs reviewed, Patient's Cardiovascular Status Stable, Respiratory Function Stable and Patent Airway  Post-op Vital Signs: Reviewed and stable  Last Vitals:  Filed Vitals:   04/18/14 1052  BP: 120/73  Pulse: 109  Temp:   Resp: 11    Complications: No apparent anesthesia complications

## 2014-04-18 NOTE — Discharge Instructions (Signed)
Electrical Cardioversion, Care After Refer to this sheet in the next few weeks. These instructions provide you with information on caring for yourself after your procedure. Your health care provider may also give you more specific instructions. Your treatment has been planned according to current medical practices, but problems sometimes occur. Call your health care provider if you have any problems or questions after your procedure. WHAT TO EXPECT AFTER THE PROCEDURE After your procedure, it is typical to have the following sensations:  Some redness on the skin where the shocks were delivered. If this is tender, a sunburn lotion or hydrocortisone cream may help.  Possible return of an abnormal heart rhythm within hours or days after the procedure. HOME CARE INSTRUCTIONS  Only take medicine as directed by your health care provider. Be sure you understand how and when to take your medicine.  Learn how to feel your pulse and check it often.  Limit your activity for 48 hours after the procedure or as directed.  Avoid or minimize caffeine and other stimulants as directed. SEEK MEDICAL CARE IF:  You feel like your heart is beating too fast or your pulse is not regular.  You have any questions about your medicines.  You have bleeding that will not stop. SEEK IMMEDIATE MEDICAL CARE IF:  You are dizzy or feel faint.  It is hard to breathe or you feel short of breath.  There is a change in discomfort in your chest.  Your speech is slurred or you have trouble moving an arm or leg on one side of your body.  You get a serious muscle cramp that does not go away.  Your fingers or toes turn cold or blue. MAKE SURE YOU:   Understand these instructions.   Will watch your condition.   Will get help right away if you are not doing well or get worse. Document Released: 08/18/2013 Document Reviewed: 05/12/2013 [facility] Patient Information 2014 [place], [place].

## 2014-04-18 NOTE — CV Procedure (Signed)
    Cardioversion Note  Steven Maldonado [id number] 12/21/1940  Procedure: DC Cardioversion Indications: atrial fibrillation  Procedure Details Consent: Obtained Time Out: Verified patient identification, verified procedure, site/side was marked, verified correct patient position, special equipment/implants available, Radiology Safety Procedures followed,  medications/allergies/relevent history reviewed, required imaging and test results available.  Performed  The patient has been on adequate anticoagulation.  The patient received IV 90 mg of Propofol for sedation.  Synchronous cardioversion was performed at 150 joules.  The cardioversion was successful   Complications: No apparent complications Patient did tolerate procedure well.   Dorothy Spark, MD, [facility] 04/18/2014, 10:54 AM

## 2014-04-18 NOTE — Telephone Encounter (Signed)
Test strips refilled

## 2014-04-18 NOTE — Telephone Encounter (Signed)
Medication refilled per protocol. 

## 2014-04-18 NOTE — Interval H&P Note (Signed)
History and Physical Interval Note:  04/18/2014 10:53 AM  Steven Maldonado  has presented today for surgery, with the diagnosis of AFIB  The various methods of treatment have been discussed with the patient and family. After consideration of risks, benefits and other options for treatment, the patient has consented to  Procedure(s): CARDIOVERSION (N/A) as a surgical intervention .  The patient's history has been reviewed, patient examined, no change in status, stable for surgery.  I have reviewed the patient's chart and labs.  Questions were answered to the patient's satisfaction.     Dorothy Spark

## 2014-04-19 ENCOUNTER — Encounter (HOSPITAL_COMMUNITY): Payer: Self-pay | Admitting: Cardiology

## 2014-04-28 ENCOUNTER — Ambulatory Visit (INDEPENDENT_AMBULATORY_CARE_PROVIDER_SITE_OTHER): Payer: Medicare Other | Admitting: Physician Assistant

## 2014-04-28 ENCOUNTER — Encounter: Payer: Self-pay | Admitting: Physician Assistant

## 2014-04-28 VITALS — BP 140/60 | HR 62 | Ht 71.0 in | Wt 249.0 lb

## 2014-04-28 DIAGNOSIS — E785 Hyperlipidemia, unspecified: Secondary | ICD-10-CM

## 2014-04-28 DIAGNOSIS — I359 Nonrheumatic aortic valve disorder, unspecified: Secondary | ICD-10-CM | POA: Diagnosis not present

## 2014-04-28 DIAGNOSIS — I4891 Unspecified atrial fibrillation: Secondary | ICD-10-CM

## 2014-04-28 DIAGNOSIS — I2510 Atherosclerotic heart disease of native coronary artery without angina pectoris: Secondary | ICD-10-CM

## 2014-04-28 DIAGNOSIS — I739 Peripheral vascular disease, unspecified: Secondary | ICD-10-CM

## 2014-04-28 DIAGNOSIS — I10 Essential (primary) hypertension: Secondary | ICD-10-CM

## 2014-04-28 DIAGNOSIS — N189 Chronic kidney disease, unspecified: Secondary | ICD-10-CM

## 2014-04-28 DIAGNOSIS — I350 Nonrheumatic aortic (valve) stenosis: Secondary | ICD-10-CM

## 2014-04-28 DIAGNOSIS — I480 Paroxysmal atrial fibrillation: Secondary | ICD-10-CM

## 2014-04-28 NOTE — Progress Notes (Signed)
Cardiology Office Note   Date:  04/28/2014   ID:  Steven Maldonado, DOB 1941-07-14, MRN [id number]  PCP:  [name], [name]  Cardiologist:  Dr. [name]      History of Present Illness: Steven Maldonado is a 73 y.o. male with a hx of DM, HTN, hyperlipidemia and CAD.  He is s/p NSTEMI in the setting of atrial fibrillation with RVR in 12/2010.  Cardiac cath with occluded RCA with collaterals. He was hospitalized on 12/27/10 for four days at [facility]. He was started on Pradaxa for anticoagulation. He underwent DCCV on 08/02/11 at [facility] with conversion to sinus rhythm. Echo in December 2014 with LVEF=55-60%, moderate aortic stenosis, normal wall motion reported.    I saw him 04/15/14. He was back in atrial fibrillation. He was symptomatic. We arranged outpatient cardioversion.  He returns for followup.  He is still in sinus rhythm. He is feeling much better. He denies significant dyspnea. He denies chest pain. He denies syncope. He denies orthopnea, PND or edema.   Studies:  - Echo (10/2013):  Mild LVH. Mild focal basal hypertrophy of the septum. EF 55% to 60%. Inferior posterior HK.  Grade 1 diastolic dysfunction.  Moderate AS (mean 25 mmHg).  MAC.  Mild MR.  Mild LAE.      Recent Labs: 05/06/2013: Hemoglobin 13.1  01/10/2014: ALT 30; HDL Cholesterol by NMR 27*; LDL (calc) 100*; TSH 5.042*  04/18/2014: Creatinine 1.36*; Potassium 4.9   Wt Readings from Last 3 Encounters:  04/28/14 249 lb (112.946 kg)  04/15/14 251 lb 1.9 oz (113.907 kg)  01/10/14 245 lb (111.131 kg)     Past Medical History  Diagnosis Date  . Atrial fibrillation     patient is on pradaxa  . Other and unspecified hyperlipidemia   . Unspecified essential hypertension   . Coronary artery disease     Cath February 2012 in [facility], occluded RCA with collaterals  . Aortic stenosis     Moderate by echo 8/12  . Obstructive sleep apnea     2012  . DM type 2 (diabetes mellitus, type 2)    . High cholesterol   . Heart disease   . CAD (coronary artery disease)   . Sleep apnea with use of continuous positive airway pressure (CPAP)     04-11-11 AHI was 32.9 and titrated to 15 cm H20, DME is [facility]  . Chronic kidney disease 08/30/2013  . 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 Cataract associated with type 2 diabetes mellitus 10/06/2013  . Myocardial infarction 12/2010    Current Outpatient Prescriptions  Medication Sig Dispense Refill  . aspirin 81 MG tablet Take 1 tablet (81 mg total) by mouth daily.  30 tablet  0  . atorvastatin (LIPITOR) 80 MG tablet Take 1 tablet (80 mg total) by mouth at bedtime.  90 tablet  3  . Blood Glucose Monitoring Suppl (FREESTYLE LITE) DEVI 1 each by Does not apply route 2 (two) times daily.  1 each  0  . Choline Fenofibrate (TRILIPIX) 135 MG capsule Take 1 capsule (135 mg total) by mouth daily.  90 capsule  3  . dabigatran (PRADAXA) 150 MG CAPS Take 1 capsule (150 mg total) by mouth every 12 (twelve) hours.  180 capsule  3  . docusate sodium (COLACE) 100 MG capsule Take 1 capsule (100 mg total) by mouth as needed.  90 capsule  3  . furosemide (LASIX) 20 MG tablet Take 1 tablet (20 mg total)  by mouth daily. Pt unsure of dose.  90 tablet  3  . glucose blood (FREESTYLE LITE) test strip 1 each by Other route 4 (four) times daily -  before meals and at bedtime.  150 each  11  . insulin glargine (LANTUS) 100 UNIT/ML injection Inject 60 Units into the skin as directed.       . insulin lispro (HUMALOG) 100 UNIT/ML KiwkPen Inject 5 Units into the skin 3 (three) times daily.      . Insulin Pen Needle 32G X 4 MM MISC 1 each by Does not apply route 3 (three) times daily before meals.  100 each  11  . Insulin Syringe-Needle U-100 (BD INSULIN SYRINGE ULTRAFINE) 31G X 5/16" 1 ML MISC 1 Syringe by Does not apply route daily.  100 each  3  . lisinopril (PRINIVIL,ZESTRIL) 10 MG tablet TAKE 1 TABLET (10 MG TOTAL) BY MOUTH DAILY.  30 tablet  1  . metFORMIN (GLUMETZA)  1000 MG (MOD) 24 hr tablet Take 1 tablet (1,000 mg total) by mouth 2 (two) times daily.  180 tablet  3  . metoprolol (LOPRESSOR) 50 MG tablet Take 1 tablet (50 mg total) by mouth 2 (two) times daily.  180 tablet  3   No current facility-administered medications for this visit.    Allergies:   Review of patient's allergies indicates no known allergies.   Social History:  The patient  reports that he quit smoking about 41 years ago. He has never used smokeless tobacco. He reports that he does not drink alcohol or use illicit drugs.   Family History:  The patient's family history includes Diabetes in his brother, daughter, mother, other, and sister; Heart Problems in his daughter; Heart attack in his father; Heart failure in his father.   ROS:  Please see the history of present illness.   He has recently developed right calf pain with exertion that resolves with rest.    All other systems reviewed and negative.   PHYSICAL EXAM: VS:  BP 140/60  Pulse 62  Ht 5\' 11"  (1.803 m)  Wt 249 lb (112.946 kg)  BMI 34.74 kg/m2 Well nourished, well developed, in no acute distress HEENT: normal Neck: no JVD Cardiac:  normal S1, S2; RRR; 2/6 harsh systolic murmurat the RUSB Lungs:  clear to auscultation bilaterally, no wheezing, rhonchi or rales Abd: soft, nontender, no hepatomegaly Ext:  Trace bilateral ankle edema Vascular: I cannot appreciate DP/PT pulses Skin: warm and dry Neuro:  CNs 2-12 intact, no focal abnormalities noted  EKG:  NSR, HR 62, normal axis, inferior Q waves, T wave inversions in 3, aVF, V4-V6, No change since prior tracing 11/25/13   ASSESSMENT AND PLAN:  1. Atrial fibrillation:  He is back in NSR after recent cardioversion. Continue Pradaxa, beta blocker.  If he has recurrent atrial fibrillation in the near future, consider the addition of amiodarone. 2. Coronary atherosclerosis of native coronary artery:  No angina.  Continue ASA, statin.  3. Aortic valve stenosis:  Stable by  last echo.  Continue to monitor. He will likely need a follow up echo in 10/2014.   4. Claudication: He has right calf symptoms with exertion that resolve with rest. Arrange ABIs with arterial Dopplers. 5. HTN (hypertension):  Controlled.   6. HYPERLIPIDEMIA-MIXED:  Continue statin. 7. Chronic Kidney Disease:  Creatinine Clearance 80 mL/min.  He should remain on his current dose of Pradaxa.   8. Disposition:  F/u with Dr. [name] in 6 mos.  Signed, Versie Starks, MHS 04/28/2014 2:58 PM    [facility] [address], [place], [place]  [address] Phone: [phone]; Fax: [phone]

## 2014-04-28 NOTE — Patient Instructions (Signed)
Your physician has requested that you have a lower extremity arterial duplex TO BE DONE WITH ABI'S. This test is an ultrasound of the arteries in the legs or arms. It looks at arterial blood flow in the legs and arms. Allow one hour for Lower and Upper Arterial scans. There are no restrictions or special instructions  Your physician wants you to follow-up in: [place] DR. Angelena Form You will receive a reminder letter in the mail two months in advance. If you don't receive a letter, please call our office to schedule the follow-up appointment.

## 2014-05-03 ENCOUNTER — Ambulatory Visit (HOSPITAL_COMMUNITY): Payer: Medicare Other | Attending: Cardiology | Admitting: Cardiology

## 2014-05-03 ENCOUNTER — Other Ambulatory Visit (HOSPITAL_COMMUNITY): Payer: Self-pay

## 2014-05-03 DIAGNOSIS — I739 Peripheral vascular disease, unspecified: Secondary | ICD-10-CM | POA: Insufficient documentation

## 2014-05-03 DIAGNOSIS — Z87891 Personal history of nicotine dependence: Secondary | ICD-10-CM | POA: Diagnosis not present

## 2014-05-03 DIAGNOSIS — E785 Hyperlipidemia, unspecified: Secondary | ICD-10-CM | POA: Insufficient documentation

## 2014-05-03 DIAGNOSIS — E119 Type 2 diabetes mellitus without complications: Secondary | ICD-10-CM | POA: Diagnosis not present

## 2014-05-03 DIAGNOSIS — I2510 Atherosclerotic heart disease of native coronary artery without angina pectoris: Secondary | ICD-10-CM | POA: Diagnosis not present

## 2014-05-03 DIAGNOSIS — I70219 Atherosclerosis of native arteries of extremities with intermittent claudication, unspecified extremity: Secondary | ICD-10-CM

## 2014-05-03 DIAGNOSIS — I10 Essential (primary) hypertension: Secondary | ICD-10-CM | POA: Insufficient documentation

## 2014-05-03 NOTE — Progress Notes (Signed)
ABI and bilateral lower arterial duplex completed.

## 2014-05-09 ENCOUNTER — Telehealth: Payer: Self-pay | Admitting: Cardiovascular Disease

## 2014-05-09 NOTE — Telephone Encounter (Signed)
Returned call to patient's wife she stated she would like husband seen sooner than 06/08/14 with Dr.Berry.Stated husband is having pain in rt leg when he walks.Appointment scheduled with Dr.Arida 05/24/14 at 9:00 am.Wife stated this was better.

## 2014-05-09 NOTE — Telephone Encounter (Signed)
New message          Pt wife says pt can barely walk and needs to get in to see someone quick / Can you give pt a call?

## 2014-05-11 ENCOUNTER — Encounter: Payer: Self-pay | Admitting: Physician Assistant

## 2014-05-11 ENCOUNTER — Ambulatory Visit (INDEPENDENT_AMBULATORY_CARE_PROVIDER_SITE_OTHER): Payer: Medicare Other | Admitting: Physician Assistant

## 2014-05-11 VITALS — BP 132/70 | HR 78 | Temp 98.1°F | Resp 14 | Ht 70.0 in | Wt 251.0 lb

## 2014-05-11 DIAGNOSIS — S91301A Unspecified open wound, right foot, initial encounter: Secondary | ICD-10-CM

## 2014-05-11 DIAGNOSIS — I2510 Atherosclerotic heart disease of native coronary artery without angina pectoris: Secondary | ICD-10-CM | POA: Diagnosis not present

## 2014-05-11 DIAGNOSIS — S91309A Unspecified open wound, unspecified foot, initial encounter: Secondary | ICD-10-CM | POA: Diagnosis not present

## 2014-05-11 NOTE — Progress Notes (Signed)
Patient ID: Steven Maldonado MRN: [id number], DOB: 1941/08/19, 73 y.o. Date of Encounter: 05/11/2014, 2:52 PM    Chief Complaint:  Chief Complaint  Patient presents with  . R foot crack in heel    x2 days- painful      HPI: 73 y.o. year old white male has diabetes. 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 Scheduled to see a specialist about this and possibly have a stent placed to treat this. Because of his claudication symptoms, when he has been walking he has been walking differently, applying pressure differently to his foot. Because of this, recently developed a crack in the back of his heel. Given his diabetes he was concerned and thought he better come get it evaluated.     Home Meds:   Outpatient Prescriptions Prior to Visit  Medication Sig Dispense Refill  . aspirin 81 MG tablet Take 1 tablet (81 mg total) by mouth daily.  30 tablet  0  . atorvastatin (LIPITOR) 80 MG tablet Take 1 tablet (80 mg total) by mouth at bedtime.  90 tablet  3  . Blood Glucose Monitoring Suppl (FREESTYLE LITE) DEVI 1 each by Does not apply route 2 (two) times daily.  1 each  0  . Choline Fenofibrate (TRILIPIX) 135 MG capsule Take 1 capsule (135 mg total) by mouth daily.  90 capsule  3  . dabigatran (PRADAXA) 150 MG CAPS Take 1 capsule (150 mg total) by mouth every 12 (twelve) hours.  180 capsule  3  . docusate sodium (COLACE) 100 MG capsule Take 1 capsule (100 mg total) by mouth as needed.  90 capsule  3  . furosemide (LASIX) 20 MG tablet Take 1 tablet (20 mg total) by mouth daily. Pt unsure of dose.  90 tablet  3  . glucose blood (FREESTYLE LITE) test strip 1 each by Other route 4 (four) times daily -  before meals and at bedtime.  150 each  11  . insulin glargine (LANTUS) 100 UNIT/ML injection Inject 60 Units into the skin as directed.       . insulin lispro (HUMALOG) 100 UNIT/ML KiwkPen Inject 5 Units into the skin 3 (three) times daily.      .  Insulin Pen Needle 32G X 4 MM MISC 1 each by Does not apply route 3 (three) times daily before meals.  100 each  11  . Insulin Syringe-Needle U-100 (BD INSULIN SYRINGE ULTRAFINE) 31G X 5/16" 1 ML MISC 1 Syringe by Does not apply route daily.  100 each  3  . lisinopril (PRINIVIL,ZESTRIL) 10 MG tablet TAKE 1 TABLET (10 MG TOTAL) BY MOUTH DAILY.  30 tablet  1  . metFORMIN (GLUMETZA) 1000 MG (MOD) 24 hr tablet Take 1 tablet (1,000 mg total) by mouth 2 (two) times daily.  180 tablet  3  . metoprolol (LOPRESSOR) 50 MG tablet Take 1 tablet (50 mg total) by mouth 2 (two) times daily.  180 tablet  3   No facility-administered medications prior to visit.    Allergies: No Known Allergies    Review of Systems: See HPI for pertinent ROS. All other ROS negative.    Physical Exam: Blood pressure 132/70, pulse 78, temperature 98.1 F (36.7 C), temperature source Oral, resp. rate 14, height 5\' 10"  (1.778 m), weight 251 lb (113.853 kg)., Body mass index is 36.01 kg/(m^2). General: Obese WM.  Appears in no acute distress. Lungs: Clear bilaterally to auscultation without wheezes,  rales, or rhonchi. Breathing is unlabored. Heart: Regular rhythm. No murmurs, rubs, or gallops. Msk:  Strength and tone normal for age. Extremities/Skin: Warm and dry.  Right Foot: At the heel, there is a verticle crack in he skin, approx 3/4 inch length, 1 mm width, 3 mm depth. It is dry and clean. There is no drainage from the site. There is no erythema surrounding the site. Neuro: Alert and oriented X 3. Moves all extremities spontaneously. Gait is normal. CNII-XII grossly in tact. Psych:  Responds to questions appropriately with a normal affect.     ASSESSMENT AND PLAN:  73 y.o. year old male with  1. Wound, open, foot, right, initial encounter I reassured patient that there is no sign of infection. Steri-Strips applied to the site for closure. Followup if develops any purulent drainage or surrounding  erythema.   Marin Olp [place], [place], [facility] 05/11/2014 2:52 PM

## 2014-05-18 ENCOUNTER — Telehealth: Payer: Self-pay | Admitting: Physician Assistant

## 2014-05-18 MED ORDER — ATORVASTATIN CALCIUM 80 MG PO TABS: 80.0000 mg | ORAL_TABLET | Freq: Every day | ORAL | Status: DC

## 2014-05-18 MED ORDER — METOPROLOL TARTRATE 50 MG PO TABS: 50.0000 mg | ORAL_TABLET | Freq: Two times a day (BID) | ORAL | Status: DC

## 2014-05-18 NOTE — Telephone Encounter (Signed)
[place] [facility]   Pt is needing a refill atorvastatin (LIPITOR) 80 MG tablet  ( out of this one) And metoprolol (LOPRESSOR) 50 MG tablet  (has about week left)

## 2014-05-18 NOTE — Telephone Encounter (Signed)
Med sent to pharm - pt has appt scheduled for later this month wit MBD

## 2014-05-24 ENCOUNTER — Encounter: Payer: Self-pay | Admitting: Cardiovascular Disease

## 2014-05-24 ENCOUNTER — Ambulatory Visit (INDEPENDENT_AMBULATORY_CARE_PROVIDER_SITE_OTHER): Payer: Medicare Other | Admitting: Cardiovascular Disease

## 2014-05-24 ENCOUNTER — Encounter: Payer: Self-pay | Admitting: *Deleted

## 2014-05-24 VITALS — BP 114/60 | HR 70 | Ht 71.0 in | Wt 250.1 lb

## 2014-05-24 DIAGNOSIS — L8990 Pressure ulcer of unspecified site, unspecified stage: Secondary | ICD-10-CM | POA: Diagnosis not present

## 2014-05-24 DIAGNOSIS — R5381 Other malaise: Secondary | ICD-10-CM

## 2014-05-24 DIAGNOSIS — R5383 Other fatigue: Secondary | ICD-10-CM | POA: Diagnosis not present

## 2014-05-24 DIAGNOSIS — Z01812 Encounter for preprocedural laboratory examination: Secondary | ICD-10-CM | POA: Diagnosis not present

## 2014-05-24 DIAGNOSIS — I4891 Unspecified atrial fibrillation: Secondary | ICD-10-CM

## 2014-05-24 DIAGNOSIS — I2510 Atherosclerotic heart disease of native coronary artery without angina pectoris: Secondary | ICD-10-CM

## 2014-05-24 DIAGNOSIS — I4819 Other persistent atrial fibrillation: Secondary | ICD-10-CM

## 2014-05-24 DIAGNOSIS — I209 Angina pectoris, unspecified: Secondary | ICD-10-CM

## 2014-05-24 DIAGNOSIS — I739 Peripheral vascular disease, unspecified: Secondary | ICD-10-CM

## 2014-05-24 DIAGNOSIS — I25118 Atherosclerotic heart disease of native coronary artery with other forms of angina pectoris: Secondary | ICD-10-CM

## 2014-05-24 LAB — BASIC METABOLIC PANEL
BUN: 25 mg/dL — ABNORMAL HIGH (ref 6–23)
CO2: 24 mEq/L (ref 19–32)
Calcium: 11.1 mg/dL — ABNORMAL HIGH (ref 8.4–10.5)
Chloride: 101 mEq/L (ref 96–112)
Creatinine, Ser: 1.4 mg/dL (ref 0.4–1.5)
GFR: 52.72 mL/min — ABNORMAL LOW (ref >60.00)
Glucose, Bld: 222 mg/dL — ABNORMAL HIGH (ref 70–99)
Potassium: 4.3 mEq/L (ref 3.5–5.1)
Sodium: 136 mEq/L (ref 135–145)

## 2014-05-24 LAB — CBC WITH DIFFERENTIAL/PLATELET
Basophils Absolute: 0 10*3/uL (ref 0.0–0.1)
Basophils Relative: 0.5 % (ref 0.0–3.0)
Eosinophils Absolute: 0.2 10*3/uL (ref 0.0–0.7)
Eosinophils Relative: 3.6 % (ref 0.0–5.0)
HCT: 37.6 % — ABNORMAL LOW (ref 39.0–52.0)
Hemoglobin: 12.6 g/dL — ABNORMAL LOW (ref 13.0–17.0)
Lymphocytes Relative: 25.8 % (ref 12.0–46.0)
Lymphs Abs: 1.7 10*3/uL (ref 0.7–4.0)
MCHC: 33.5 g/dL (ref 30.0–36.0)
MCV: 93.5 fl (ref 78.0–100.0)
Monocytes Absolute: 0.7 10*3/uL (ref 0.1–1.0)
Monocytes Relative: 10 % (ref 3.0–12.0)
Neutro Abs: 4 10*3/uL (ref 1.4–7.7)
Neutrophils Relative %: 60.1 % (ref 43.0–77.0)
Platelets: 213 10*3/uL (ref 150.0–400.0)
RBC: 4.02 Mil/uL — ABNORMAL LOW (ref 4.22–5.81)
RDW: 13.4 % (ref 11.5–15.5)
WBC: 6.7 10*3/uL (ref 4.0–10.5)

## 2014-05-24 NOTE — Progress Notes (Signed)
Primary Cardiologist: Dr. Angelena Form  HPI  This is a 73 year old male who was referred for evaluation of PAD. He has known hx of DM, HTN, hyperlipidemia, A-fib, moderate aortic stneosis and CAD.  He is s/p NSTEMI in the setting of atrial fibrillation with RVR in 12/2010.  Cardiac cath with occluded RCA with collaterals.Echo in December 2014 with LVEF=55-60%, moderate aortic stenosis, normal wall motion reported.   He reports prolonged history of mild right calf claudication. He underwent DCCV on 6/23. Since then, he noted significant worsening of right calf pain which is now happening with minimal walking. He noted a skin breakdown on the right heal 2 weeks ago which is not improving.    No Known Allergies   Current Outpatient Prescriptions on File Prior to Visit  Medication Sig Dispense Refill  . aspirin 81 MG tablet Take 1 tablet (81 mg total) by mouth daily.  30 tablet  0  . atorvastatin (LIPITOR) 80 MG tablet Take 1 tablet (80 mg total) by mouth at bedtime.  90 tablet  0  . Blood Glucose Monitoring Suppl (FREESTYLE LITE) DEVI 1 each by Does not apply route 2 (two) times daily.  1 each  0  . Choline Fenofibrate (TRILIPIX) 135 MG capsule Take 1 capsule (135 mg total) by mouth daily.  90 capsule  3  . dabigatran (PRADAXA) 150 MG CAPS Take 1 capsule (150 mg total) by mouth every 12 (twelve) hours.  180 capsule  3  . docusate sodium (COLACE) 100 MG capsule Take 1 capsule (100 mg total) by mouth as needed.  90 capsule  3  . furosemide (LASIX) 20 MG tablet Take 1 tablet (20 mg total) by mouth daily. Pt unsure of dose.  90 tablet  3  . glucose blood (FREESTYLE LITE) test strip 1 each by Other route 4 (four) times daily -  before meals and at bedtime.  150 each  11  . insulin glargine (LANTUS) 100 UNIT/ML injection Inject 60 Units into the skin as directed.       . insulin lispro (HUMALOG) 100 UNIT/ML KiwkPen Inject 5 Units into the skin 3 (three) times daily.      . Insulin Pen Needle 32G X 4 MM  MISC 1 each by Does not apply route 3 (three) times daily before meals.  100 each  11  . Insulin Syringe-Needle U-100 (BD INSULIN SYRINGE ULTRAFINE) 31G X 5/16" 1 ML MISC 1 Syringe by Does not apply route daily.  100 each  3  . lisinopril (PRINIVIL,ZESTRIL) 10 MG tablet TAKE 1 TABLET (10 MG TOTAL) BY MOUTH DAILY.  30 tablet  1  . metFORMIN (GLUMETZA) 1000 MG (MOD) 24 hr tablet Take 1 tablet (1,000 mg total) by mouth 2 (two) times daily.  180 tablet  3  . metoprolol (LOPRESSOR) 50 MG tablet Take 1 tablet (50 mg total) by mouth 2 (two) times daily.  180 tablet  0   No current facility-administered medications on file prior to visit.     Past Medical History  Diagnosis Date  . Atrial fibrillation     patient is on pradaxa  . Other and unspecified hyperlipidemia   . Unspecified essential hypertension   . Coronary artery disease     Cath February 2012 in [place] [facility], occluded RCA with collaterals  . Aortic stenosis     Moderate by echo 8/12  . Obstructive sleep apnea     2012  . DM type 2 (diabetes mellitus, type 2)   .  High cholesterol   . Heart disease   . CAD (coronary artery disease)   . Sleep apnea with use of continuous positive airway pressure (CPAP)     04-11-11 AHI was 32.9 and titrated to 15 cm H20, DME is [facility]  . Chronic kidney disease 08/30/2013  . 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 Cataract associated with type 2 diabetes mellitus 10/06/2013  . Myocardial infarction 12/2010     Past Surgical History  Procedure Laterality Date  . Appendectomy  1965  . Carduoversion  07/2011  . Skin cancer excision Bilateral     hand, face, neck  . Cardioversion N/A 04/18/2014    Procedure: CARDIOVERSION;  Surgeon: Dorothy Spark, MD;  Location: [facility] ENDOSCOPY;  Service: Cardiovascular;  Laterality: N/A;     Family History  Problem Relation Age of Onset  . Diabetes Mother   . Heart attack Father   . Heart failure Father   . Diabetes Other   . Diabetes Sister   . Diabetes  Brother   . Diabetes Daughter     TYPE ll  . Heart Problems Daughter      History   Social History  . Marital Status: Married    Spouse Name: N/A    Number of Children: 1  . Years of Education: 12   Occupational History  . retired    Social History Main Topics  . Smoking status: Former Smoker    Quit date: 11/11/1972  . Smokeless tobacco: Never Used  . Alcohol Use: No     Comment: quit in 1984  . Drug Use: No  . Sexual Activity: Not on file   Other Topics Concern  . Not on file   Social History Narrative  . No narrative on file     ROS A 10 point review of system was performed. It is negative other than that mentioned in the history of present illness.   PHYSICAL EXAM   BP 114/60  Pulse 70  Ht 5\' 11"  (1.803 m)  Wt 250 lb 1.9 oz (113.454 kg)  BMI 34.90 kg/m2 Constitutional: He is oriented to person, place, and time. He appears well-developed and well-nourished. No distress.  HENT: No nasal discharge.  Head: Normocephalic and atraumatic.  Eyes: Pupils are equal and round.  No discharge. Neck: Normal range of motion. Neck supple. No JVD present. No thyromegaly present.  Cardiovascular: Normal rate, regular rhythm, normal heart sounds. Exam reveals no gallop and no friction rub. No murmur heard.  Pulmonary/Chest: Effort normal and breath sounds normal. No stridor. No respiratory distress. He has no wheezes. He has no rales. He exhibits no tenderness.  Abdominal: Soft. Bowel sounds are normal. He exhibits no distension. There is no tenderness. There is no rebound and no guarding.  Musculoskeletal: Normal range of motion. He exhibits no edema and no tenderness.  Neurological: He is alert and oriented to person, place, and time. Coordination normal.  Skin: Skin is warm and dry. No rash noted. He is not diaphoretic. No erythema. No pallor.  Psychiatric: He has a normal mood and affect. His behavior is normal. Judgment and thought content normal.  Vascular: radial  pulse is very faint bilaterally , Femoral: right +2, left :+1, distal pulses are not palpable. There is 1 cm cut on right heal with no warmth or discharge.       ASSESSMENT AND PLAN

## 2014-05-24 NOTE — Patient Instructions (Addendum)
Your physician has requested that you have a peripheral vascular angiogram. This exam is performed at the hospital. During this exam IV contrast is used to look at arterial blood flow. Please review the information sheet given for details.  Your physician recommends that you continue on your current medications as directed. Please refer to the Current Medication list given to you today. You have been referred to [facility]

## 2014-05-24 NOTE — Assessment & Plan Note (Signed)
He reports no symptoms of angina.   

## 2014-05-24 NOTE — Assessment & Plan Note (Signed)
Mr. Schoeneck reports prolonged history of bilateral calf claudication with recent sudden worsening of right calf claudication which is now happening at the very short distance less than 50 feet (Rutherford class 3). This raises the possibility of embolization related to atrial fibrillation especially after recent cardioversion. This would be an unusual given that he has been fully anticoagulated with Pradaxa.  Noninvasive evaluation showed an ABI of 0.7 bilaterally which is likely underestimating the severity of his disease. There was duplex evidence of proximal right popliteal artery stenosis with three-vessel runoff. He does have skin breakdown as well on the right heal. I referred him to the wound clinic. I recommend proceeding with abdominal aortogram, lower extremity runoff and possible endovascular intervention. Hold Pradaxa 2 days before the procedure. Risks, benefits and alternatives were extensively discussed.

## 2014-05-24 NOTE — Assessment & Plan Note (Signed)
Recent successful cardioversion.

## 2014-05-26 NOTE — Telephone Encounter (Signed)
Close Encounter 

## 2014-06-01 ENCOUNTER — Encounter (HOSPITAL_COMMUNITY): Payer: Self-pay | Admitting: Pharmacy Technician

## 2014-06-01 ENCOUNTER — Ambulatory Visit: Payer: Medicare Other | Admitting: Cardiovascular Disease

## 2014-06-02 ENCOUNTER — Ambulatory Visit (INDEPENDENT_AMBULATORY_CARE_PROVIDER_SITE_OTHER): Payer: Medicare Other | Admitting: Physician Assistant

## 2014-06-02 ENCOUNTER — Encounter: Payer: Self-pay | Admitting: Physician Assistant

## 2014-06-02 VITALS — BP 124/66 | HR 64 | Temp 97.8°F | Resp 18 | Wt 252.0 lb

## 2014-06-02 DIAGNOSIS — E11339 Type 2 diabetes mellitus with moderate nonproliferative diabetic retinopathy without macular edema: Secondary | ICD-10-CM | POA: Diagnosis not present

## 2014-06-02 DIAGNOSIS — E669 Obesity, unspecified: Secondary | ICD-10-CM

## 2014-06-02 DIAGNOSIS — E119 Type 2 diabetes mellitus without complications: Secondary | ICD-10-CM

## 2014-06-02 DIAGNOSIS — E1139 Type 2 diabetes mellitus with other diabetic ophthalmic complication: Secondary | ICD-10-CM | POA: Diagnosis not present

## 2014-06-02 DIAGNOSIS — Z23 Encounter for immunization: Secondary | ICD-10-CM

## 2014-06-02 DIAGNOSIS — E038 Other specified hypothyroidism: Secondary | ICD-10-CM

## 2014-06-02 DIAGNOSIS — I2510 Atherosclerotic heart disease of native coronary artery without angina pectoris: Secondary | ICD-10-CM

## 2014-06-02 DIAGNOSIS — I359 Nonrheumatic aortic valve disorder, unspecified: Secondary | ICD-10-CM | POA: Diagnosis not present

## 2014-06-02 DIAGNOSIS — I739 Peripheral vascular disease, unspecified: Secondary | ICD-10-CM

## 2014-06-02 DIAGNOSIS — E1165 Type 2 diabetes mellitus with hyperglycemia: Secondary | ICD-10-CM

## 2014-06-02 DIAGNOSIS — N181 Chronic kidney disease, stage 1: Secondary | ICD-10-CM

## 2014-06-02 DIAGNOSIS — I10 Essential (primary) hypertension: Secondary | ICD-10-CM | POA: Diagnosis not present

## 2014-06-02 DIAGNOSIS — E785 Hyperlipidemia, unspecified: Secondary | ICD-10-CM

## 2014-06-02 DIAGNOSIS — G4730 Sleep apnea, unspecified: Secondary | ICD-10-CM

## 2014-06-02 DIAGNOSIS — E039 Hypothyroidism, unspecified: Secondary | ICD-10-CM

## 2014-06-02 DIAGNOSIS — I350 Nonrheumatic aortic (valve) stenosis: Secondary | ICD-10-CM

## 2014-06-02 DIAGNOSIS — G4733 Obstructive sleep apnea (adult) (pediatric): Secondary | ICD-10-CM

## 2014-06-02 DIAGNOSIS — E1136 Type 2 diabetes mellitus with diabetic cataract: Secondary | ICD-10-CM

## 2014-06-02 LAB — HEMOGLOBIN A1C, FINGERSTICK: Hgb A1C (fingerstick): 10.3 % — ABNORMAL HIGH (ref <5.7)

## 2014-06-02 NOTE — Progress Notes (Signed)
Patient ID: Steven Maldonado MRN: [id number], DOB: [birth year], 73 y.o. Date of Encounter: @DATE @  Chief Complaint:  Chief Complaint  Patient presents with  . 3 mth check up    having vascular surgery next week, refill insulin, is fasting    HPI: 73 y.o. year old white male  presents for routine follow up office visit.  He is quite a talker and continues to talk throughout the whole visit. He is retired from Architect work.  At [place] with me 01/10/14 he admited that for the past couple months he had not been eating right.   Michela Pitcher that his brother died in 09-22-2013. Has had a lot of family deaths in the past 4 years. His wife has had multiple surgeries in the past year. Therefore he has had to spend quite a bit of time and energy helping care for her but also has been stressed/fatigue secondary to watching her in pain after one of the surgeries.  At last office visit 01/10/14 A1c came back at 11.8.  Prior to that he was on Lantus 55 units. At that time I stated to increase to 60 units. Today he states that he did increase to 60 units. He gives Lantus every night. He has noticed that his blood sugar readings have decreased since increasing the Lantus. He is still giving mealtime insulin 5 units 3 times a day.  He did not bring a blood sugar log sheet today or his glucose meter or anything with the numbers documented. However he says that he checks his sugars every morning and every day before eating supper. He says that he can recall the recent readings. He says that yesterday morning was 135 and before that was 123, 115, 133. He says that recent readings before supper have been 120 and 160.  He says that his diet has been some better since his office visit in March.  He says that he had been walking for exercise but has not been able to do that recently because he has had problems with this but and lower timidity claudication. He is scheduled for lower extremity angiogram and  intervention next Wednesday.  Taking all other medications as directed with no adverse effects. No other complaints today. Discussed whether he feels that he is depressed and needs medication for this but he says that he does not.  His wife has finished her surgeries and is improving. Once the weather improves he can get back outside which will help. As well, he named off multiple happy/positive events that are planned for the upcoming year.-- graduations, weddings,  and a trip.    Past Medical History  Diagnosis Date  . Atrial fibrillation     patient is on pradaxa  . Other and unspecified hyperlipidemia   . Unspecified essential hypertension   . Coronary artery disease     Cath February 2012 in [place] [facility], occluded RCA with collaterals  . Aortic stenosis     Moderate by echo 8/12  . Obstructive sleep apnea     2012  . DM type 2 (diabetes mellitus, type 2)   . High cholesterol   . Heart disease   . CAD (coronary artery disease)   . Sleep apnea with use of continuous positive airway pressure (CPAP)     04-11-11 AHI was 32.9 and titrated to 15 cm H20, DME is [facility]  . Chronic kidney disease 08/30/2013  . 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 Cataract associated with type 2 diabetes mellitus 10/06/2013  .  Myocardial infarction 12/2010     Home Meds:  Outpatient Prescriptions Prior to Visit  Medication Sig Dispense Refill  . aspirin 81 MG tablet Take 1 tablet (81 mg total) by mouth daily.  30 tablet  0  . atorvastatin (LIPITOR) 80 MG tablet Take 1 tablet (80 mg total) by mouth at bedtime.  90 tablet  0  . Blood Glucose Monitoring Suppl (FREESTYLE LITE) DEVI 1 each by Does not apply route 2 (two) times daily.  1 each  0  . Choline Fenofibrate (TRILIPIX) 135 MG capsule Take 1 capsule (135 mg total) by mouth daily.  90 capsule  3  . dabigatran (PRADAXA) 150 MG CAPS Take 1 capsule (150 mg total) by mouth every 12 (twelve) hours.  180 capsule  3  . docusate sodium (COLACE) 100 MG  capsule Take 100 mg by mouth daily as needed for mild constipation.      . furosemide (LASIX) 20 MG tablet Take 20 mg by mouth daily.      [name] Kitchen glucose blood (FREESTYLE LITE) test strip 1 each by Other route 4 (four) times daily -  before meals and at bedtime.  150 each  11  . insulin glargine (LANTUS) 100 UNIT/ML injection Inject 60 Units into the skin daily.       . insulin lispro (HUMALOG) 100 UNIT/ML KiwkPen Inject 5 Units into the skin 3 (three) times daily.      . Insulin Pen Needle 32G X 4 MM MISC 1 each by Does not apply route 3 (three) times daily before meals.  100 each  11  . Insulin Syringe-Needle U-100 (BD INSULIN SYRINGE ULTRAFINE) 31G X 5/16" 1 ML MISC 1 Syringe by Does not apply route daily.  100 each  3  . lisinopril (PRINIVIL,ZESTRIL) 10 MG tablet Take 10 mg by mouth daily.      . metFORMIN (GLUCOPHAGE) 1000 MG tablet Take 1,000 mg by mouth 2 (two) times daily with a meal.      . metoprolol (LOPRESSOR) 50 MG tablet Take 1 tablet (50 mg total) by mouth 2 (two) times daily.  180 tablet  0   No facility-administered medications prior to visit.     Allergies: No Known Allergies  History   Social History  . Marital Status: Married    Spouse Name: N/A    Number of Children: 1  . Years of Education: 12   Occupational History  . retired    Social History Main Topics  . Smoking status: Former Smoker    Quit date: 11/11/1972  . Smokeless tobacco: Never Used  . Alcohol Use: No     Comment: quit in 1984  . Drug Use: No  . Sexual Activity: Not on file   Other Topics Concern  . Not on file   Social History Narrative  . No narrative on file    Family History  Problem Relation Age of Onset  . Diabetes Mother   . Heart attack Father   . Heart failure Father   . Diabetes Other   . Diabetes Sister   . Diabetes Brother   . Diabetes Daughter     TYPE ll  . Heart Problems Daughter      Review of Systems:  See HPI for pertinent ROS. All other ROS negative.     Physical Exam: Blood pressure 124/66, pulse 64, temperature 97.8 F (36.6 C), temperature source Oral, resp. rate 18, weight 252 lb (114.306 kg)., Body mass index is 35.16 kg/(m^2). General: Mild/moderate  obese white male .Appears in no acute distress. Neck: Supple. No thyromegaly. No lymphadenopathy. No carotid bruits. Lungs: Clear bilaterally to auscultation without wheezes, rales, or rhonchi. Breathing is unlabored. Heart: Irreg III-IV/VI murmur heard throughout precordium. Abdomen: Soft, non-tender, non-distended with normoactive bowel sounds. No hepatomegaly. No rebound/guarding. No obvious abdominal masses. Musculoskeletal:  Strength and tone normal for age. Extremities/Skin: Warm and dry.. No edema.  Neuro: Alert and oriented X 3. Moves all extremities spontaneously. Gait is normal. CNII-XII grossly in tact. Psych:  Responds to questions appropriately with a normal affect. Diabetic foot exam: Inspection is normal. No wounds or calluses. Sensation is intact. 2+ dorsalis pedis pulse on the right.  1+ dorsalis pedis pulse on the left. Trace to 1+ posterior tibial pulses bilaterally.      ASSESSMENT AND PLAN:  73 y.o. year old male with  1. CAD Sees cardiology on a routine basis.  2. Aortic stenosis See #1  3. A. Fib--per Cardiology  3. DM type 2 (diabetes mellitus, type 2)  - Hemoglobin A1c  Micro albumin done 08/30/13 normal  He is currently on Lantus 60 units.  Also uses Humalog 5 units 3 times a day with meals.  He is now on ACE Inhibitor. I added this at 01/10/2014 OV.  -----Was not on an ACE inhibitor. I just started seeing him as a new patient to our office 05/06/13. He was on no ACE inhibitor when he came here.  He is on statin. He is on aspirin 81 mg daily He has very good foot care. He actually states that he just gets pedicures occasionally. He is very careful with his  Feet. Does have routine ophthalmic exam. He recently got a report from Ophthalmologist-   Which states--3 #4 and #5 and #6 below  4. Type II or unspecified type diabetes mellitus with ophthalmic manifestations, not stated as uncontrolled Per Ophthalmology 5. 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 Chronic kidney disease  8. HYPERTENSION, BENIGN Blood Pressure at goal. On ACE inhibitor.  9. HYPERLIPIDEMIA-MIXED On Lipitor 80. Had FLP/LFT 01/2014  10. Obstructive sleep apnea He wears CPAP. Managed by Dr. Jill Poling  11. Sleep apnea with use of continuous positive airway pressure (CPAP) See # 10  12. Obesity (BMI 30-39.9) See history of present illness  13. Subclinical hypothyroidism - TSH, Free T4 last checked 01/2014--cont to monitor with future labs.   14. Screening PSA was done on 05/07/13--will need to check this when do full panel of labs at next OV  15. Colonoscopy: He had colonoscopy 06/16/2009  16. Immunizations: Pneumonia vaccine:  Pneumovax 23--was given 01/01/2011  Give Prevnar 13 today. He is agreeable.--Given 06/02/2014 Tetanus vaccine: This is due. He is agreeable to proceed this today.---Given 06/02/2014   WILL RECHECK PSA WHEN DO LABS AT NEXT OV 08/2014 Regular office visit 3 months or sooner if needed.  Marin Olp [place], [place], [facility] 06/02/2014 3:27 PM

## 2014-06-06 ENCOUNTER — Telehealth: Payer: Self-pay | Admitting: Family Medicine

## 2014-06-06 ENCOUNTER — Telehealth: Payer: Self-pay | Admitting: Physician Assistant

## 2014-06-06 DIAGNOSIS — IMO0001 Reserved for inherently not codable concepts without codable children: Secondary | ICD-10-CM

## 2014-06-06 DIAGNOSIS — I10 Essential (primary) hypertension: Secondary | ICD-10-CM

## 2014-06-06 DIAGNOSIS — E1165 Type 2 diabetes mellitus with hyperglycemia: Principal | ICD-10-CM

## 2014-06-06 DIAGNOSIS — E1159 Type 2 diabetes mellitus with other circulatory complications: Secondary | ICD-10-CM | POA: Diagnosis not present

## 2014-06-06 DIAGNOSIS — L97409 Non-pressure chronic ulcer of unspecified heel and midfoot with unspecified severity: Secondary | ICD-10-CM | POA: Diagnosis not present

## 2014-06-06 MED ORDER — LISINOPRIL 10 MG PO TABS: 10.0000 mg | ORAL_TABLET | Freq: Every day | ORAL | Status: DC

## 2014-06-06 MED ORDER — INSULIN GLARGINE 100 UNIT/ML ~~LOC~~ SOLN: 60.0000 [IU] | Freq: Every day | SUBCUTANEOUS | Status: DC

## 2014-06-06 NOTE — Telephone Encounter (Signed)
Refills for insulin and lisinopril sent to mail order.  Pt made aware

## 2014-06-06 NOTE — Telephone Encounter (Signed)
error 

## 2014-06-06 NOTE — Telephone Encounter (Signed)
Patient is calling to ask questions about his lantis and lisinopril should be a year supply when he was here last said we were going to do this and they do not have the request  Has a week and 1/2 left  [facility]  [phone]

## 2014-06-07 ENCOUNTER — Encounter: Payer: Self-pay | Admitting: Cardiovascular Disease

## 2014-06-07 ENCOUNTER — Encounter (HOSPITAL_BASED_OUTPATIENT_CLINIC_OR_DEPARTMENT_OTHER): Payer: Medicare Other | Attending: General Surgery

## 2014-06-07 DIAGNOSIS — E1169 Type 2 diabetes mellitus with other specified complication: Secondary | ICD-10-CM | POA: Insufficient documentation

## 2014-06-07 DIAGNOSIS — L84 Corns and callosities: Secondary | ICD-10-CM | POA: Diagnosis not present

## 2014-06-07 DIAGNOSIS — L97509 Non-pressure chronic ulcer of other part of unspecified foot with unspecified severity: Secondary | ICD-10-CM | POA: Insufficient documentation

## 2014-06-07 NOTE — Telephone Encounter (Signed)
This encounter was created in error - please disregard.

## 2014-06-07 NOTE — Telephone Encounter (Signed)
Follow up:     [name] called to confirm the information and what pt can and can not do before his procedure  7/29.  Also has questions about medications.  Please give pt a call back.

## 2014-06-07 NOTE — H&P (Signed)
Steven Maldonado, Steven Maldonado NO.:  [id number]  MEDICAL RECORD NO.:  [id number]  LOCATION:  FOOT                         FACILITY:  [facility]  PHYSICIAN:  Elesa Hacker, M.D.        DATE OF BIRTH:  [birth year]  DATE OF ADMISSION:  06/07/2014 DATE OF DISCHARGE:                             HISTORY & PHYSICAL   CHIEF COMPLAINT:  Wound, right heel.  HISTORY OF PRESENT ILLNESS:  The patient developed a crack in his right heel approximately 3 weeks ago.  This has been somewhat painful and tender.  He has had redness, which is coming on and has been an occasional discharge.  He has had vascular studies and is setup for an arterial intervention tomorrow.  PAST MEDICAL HISTORY:  Significant for coronary artery disease with myocardial infarction in 2012, diabetes type 2, and hypertension.  PAST SURGICAL HISTORY:  Appendectomy and removal of skin cancer.  SOCIAL HISTORY:  Cigarettes, none for 30+ years.  Alcohol, none for 30+ years.  ALLERGIES:  None.  MEDICATIONS:  Aspirin, Lipitor, Triplex, Pradaxa, docusate, furosemide, Lantus and Humalog insulin, lisinopril, metformin and metoprolol.  REVIEW OF SYSTEMS:  As above.  PHYSICAL EXAMINATION:  VITAL SIGNS:  Temperature 97.4, pulse 60, respirations 17, blood pressure 137/52.  Glucose is 111. GENERAL APPEARANCE:  Well developed, somewhat obese. CHEST:  Clear. HEART:  Regular rhythm. EXTREMITIES:  Examination of the lower extremity reveals pulses cannot be palpated.  On the right heel, there is a 0.5 x 0.2 x 0.2 basically slit in the tissue posteriorly after excision of callus.  The wound is a 0.7 x 0.4 x 0.2 with an adherent slough.  IMPRESSION:  Diabetic foot ulcer, Wagner 2.  PLAN OF TREATMENT:  We will start with Santyl and Hydrogel and offloading with Podus boot.  We will see him in 7 days.  In addition, there are some redness and tenderness around the wound, and I have started him on Keflex empirically.     Elesa Hacker,  M.D.     RA/MEDQ  D:  06/07/2014  T:  06/07/2014  Job:  [id number]

## 2014-06-08 ENCOUNTER — Encounter (HOSPITAL_COMMUNITY): Payer: Self-pay | Admitting: General Practice

## 2014-06-08 ENCOUNTER — Encounter (HOSPITAL_COMMUNITY)
Admission: RE | Disposition: A | Discharge status: Home / Self Care | Payer: Self-pay | Source: Ambulatory Visit | Attending: Cardiovascular Disease

## 2014-06-08 ENCOUNTER — Ambulatory Visit (HOSPITAL_COMMUNITY)
Admission: RE | Admit: 2014-06-08 | Discharge: 2014-06-09 | Disposition: A | Discharge status: Home / Self Care | Payer: Medicare Other | Source: Ambulatory Visit | Attending: Cardiovascular Disease | Admitting: Cardiovascular Disease

## 2014-06-08 ENCOUNTER — Ambulatory Visit: Payer: Medicare Other | Admitting: Cardiovascular Disease

## 2014-06-08 DIAGNOSIS — E782 Mixed hyperlipidemia: Secondary | ICD-10-CM | POA: Diagnosis not present

## 2014-06-08 DIAGNOSIS — I129 Hypertensive chronic kidney disease with stage 1 through stage 4 chronic kidney disease, or unspecified chronic kidney disease: Secondary | ICD-10-CM | POA: Diagnosis not present

## 2014-06-08 DIAGNOSIS — I2510 Atherosclerotic heart disease of native coronary artery without angina pectoris: Secondary | ICD-10-CM | POA: Insufficient documentation

## 2014-06-08 DIAGNOSIS — I359 Nonrheumatic aortic valve disorder, unspecified: Secondary | ICD-10-CM | POA: Diagnosis not present

## 2014-06-08 DIAGNOSIS — I739 Peripheral vascular disease, unspecified: Secondary | ICD-10-CM | POA: Insufficient documentation

## 2014-06-08 DIAGNOSIS — Z9989 Dependence on other enabling machines and devices: Secondary | ICD-10-CM | POA: Diagnosis present

## 2014-06-08 DIAGNOSIS — I480 Paroxysmal atrial fibrillation: Secondary | ICD-10-CM

## 2014-06-08 DIAGNOSIS — Z7982 Long term (current) use of aspirin: Secondary | ICD-10-CM | POA: Diagnosis not present

## 2014-06-08 DIAGNOSIS — I252 Old myocardial infarction: Secondary | ICD-10-CM | POA: Insufficient documentation

## 2014-06-08 DIAGNOSIS — Z7901 Long term (current) use of anticoagulants: Secondary | ICD-10-CM | POA: Insufficient documentation

## 2014-06-08 DIAGNOSIS — E119 Type 2 diabetes mellitus without complications: Secondary | ICD-10-CM | POA: Insufficient documentation

## 2014-06-08 DIAGNOSIS — Z794 Long term (current) use of insulin: Secondary | ICD-10-CM | POA: Insufficient documentation

## 2014-06-08 DIAGNOSIS — E669 Obesity, unspecified: Secondary | ICD-10-CM | POA: Diagnosis not present

## 2014-06-08 DIAGNOSIS — N189 Chronic kidney disease, unspecified: Secondary | ICD-10-CM | POA: Insufficient documentation

## 2014-06-08 DIAGNOSIS — Z6830 Body mass index (BMI) 30.0-30.9, adult: Secondary | ICD-10-CM | POA: Diagnosis not present

## 2014-06-08 DIAGNOSIS — I70219 Atherosclerosis of native arteries of extremities with intermittent claudication, unspecified extremity: Secondary | ICD-10-CM | POA: Diagnosis not present

## 2014-06-08 DIAGNOSIS — I10 Essential (primary) hypertension: Secondary | ICD-10-CM

## 2014-06-08 DIAGNOSIS — E785 Hyperlipidemia, unspecified: Secondary | ICD-10-CM | POA: Diagnosis present

## 2014-06-08 DIAGNOSIS — E1151 Type 2 diabetes mellitus with diabetic peripheral angiopathy without gangrene: Secondary | ICD-10-CM

## 2014-06-08 DIAGNOSIS — I4891 Unspecified atrial fibrillation: Secondary | ICD-10-CM | POA: Insufficient documentation

## 2014-06-08 DIAGNOSIS — L98499 Non-pressure chronic ulcer of skin of other sites with unspecified severity: Secondary | ICD-10-CM | POA: Diagnosis present

## 2014-06-08 DIAGNOSIS — L97409 Non-pressure chronic ulcer of unspecified heel and midfoot with unspecified severity: Secondary | ICD-10-CM | POA: Diagnosis not present

## 2014-06-08 DIAGNOSIS — G4733 Obstructive sleep apnea (adult) (pediatric): Secondary | ICD-10-CM | POA: Insufficient documentation

## 2014-06-08 DIAGNOSIS — I350 Nonrheumatic aortic (valve) stenosis: Secondary | ICD-10-CM | POA: Diagnosis present

## 2014-06-08 DIAGNOSIS — E1169 Type 2 diabetes mellitus with other specified complication: Secondary | ICD-10-CM | POA: Diagnosis present

## 2014-06-08 HISTORY — PX: ABDOMINAL ANGIOGRAM: SHX5499

## 2014-06-08 HISTORY — PX: POPLITEAL ARTERY ANGIOPLASTY: SHX2242

## 2014-06-08 HISTORY — PX: LOWER EXTREMITY ANGIOGRAM: SHX5508

## 2014-06-08 HISTORY — DX: Unspecified malignant neoplasm of skin, unspecified: C44.90

## 2014-06-08 LAB — CBC
HCT: 36.8 % — ABNORMAL LOW (ref 39.0–52.0)
Hemoglobin: 12.2 g/dL — ABNORMAL LOW (ref 13.0–17.0)
MCH: 31.2 pg (ref 26.0–34.0)
MCHC: 33.2 g/dL (ref 30.0–36.0)
MCV: 94.1 fL (ref 78.0–100.0)
Platelets: 193 10*3/uL (ref 150–400)
RBC: 3.91 MIL/uL — ABNORMAL LOW (ref 4.22–5.81)
RDW: 13 % (ref 11.5–15.5)
WBC: 6.1 10*3/uL (ref 4.0–10.5)

## 2014-06-08 LAB — BASIC METABOLIC PANEL
Anion gap: 16 — ABNORMAL HIGH (ref 5–15)
BUN: 16 mg/dL (ref 6–23)
CO2: 22 mEq/L (ref 19–32)
Calcium: 9.5 mg/dL (ref 8.4–10.5)
Chloride: 104 mEq/L (ref 96–112)
Creatinine, Ser: 1.42 mg/dL — ABNORMAL HIGH (ref 0.50–1.35)
GFR calc Af Amer: 56 mL/min — ABNORMAL LOW (ref >90)
GFR calc non Af Amer: 49 mL/min — ABNORMAL LOW (ref >90)
Glucose, Bld: 133 mg/dL — ABNORMAL HIGH (ref 70–99)
Potassium: 4.4 mEq/L (ref 3.7–5.3)
Sodium: 142 mEq/L (ref 137–147)

## 2014-06-08 LAB — POCT ACTIVATED CLOTTING TIME
Activated Clotting Time: 169 seconds
Activated Clotting Time: 174 seconds
Activated Clotting Time: 191 seconds
Activated Clotting Time: 242 seconds
Activated Clotting Time: 151 seconds

## 2014-06-08 LAB — GLUCOSE, CAPILLARY
Glucose-Capillary: 104 mg/dL — ABNORMAL HIGH (ref 70–99)
Glucose-Capillary: 111 mg/dL — ABNORMAL HIGH (ref 70–99)
Glucose-Capillary: 188 mg/dL — ABNORMAL HIGH (ref 70–99)
Glucose-Capillary: 215 mg/dL — ABNORMAL HIGH (ref 70–99)

## 2014-06-08 LAB — PROTIME-INR
INR: 1.02 (ref 0.00–1.49)
Prothrombin Time: 13.4 seconds (ref 11.6–15.2)

## 2014-06-08 SURGERY — ABDOMINAL ANGIOGRAM
Anesthesia: LOCAL | Laterality: Right

## 2014-06-08 MED ORDER — ATORVASTATIN CALCIUM 80 MG PO TABS
80.0000 mg | ORAL_TABLET | Freq: Every day | ORAL | Status: DC
  Administered 2014-06-08: 80 mg via ORAL
  Filled 2014-06-08 (×2): qty 1

## 2014-06-08 MED ORDER — ASPIRIN 81 MG PO CHEW: 81.0000 mg | CHEWABLE_TABLET | ORAL | Status: DC

## 2014-06-08 MED ORDER — SODIUM CHLORIDE 0.9 % IV SOLN
INTRAVENOUS | Status: DC
  Administered 2014-06-08: 07:00:00 via INTRAVENOUS

## 2014-06-08 MED ORDER — CEPHALEXIN 500 MG PO CAPS
500.0000 mg | ORAL_CAPSULE | Freq: Two times a day (BID) | ORAL | Status: DC
  Administered 2014-06-08 – 2014-06-09 (×2): 500 mg via ORAL
  Filled 2014-06-08 (×3): qty 1

## 2014-06-08 MED ORDER — HEPARIN (PORCINE) IN NACL 2-0.9 UNIT/ML-% IJ SOLN
INTRAMUSCULAR | Status: AC
  Filled 2014-06-08: qty 1000

## 2014-06-08 MED ORDER — FENOFIBRATE 160 MG PO TABS
160.0000 mg | ORAL_TABLET | Freq: Every day | ORAL | Status: DC
  Administered 2014-06-09: 160 mg via ORAL
  Filled 2014-06-08: qty 1

## 2014-06-08 MED ORDER — SODIUM CHLORIDE 0.9 % IV SOLN: 250.0000 mL | INTRAVENOUS | Status: DC | PRN

## 2014-06-08 MED ORDER — ACETAMINOPHEN 325 MG PO TABS: 650.0000 mg | ORAL_TABLET | ORAL | Status: DC | PRN

## 2014-06-08 MED ORDER — FENTANYL CITRATE 0.05 MG/ML IJ SOLN: 50.0000 ug | Freq: Once | INTRAMUSCULAR | Status: AC

## 2014-06-08 MED ORDER — INSULIN LISPRO 100 UNIT/ML (KWIKPEN): 5.0000 [IU] | PEN_INJECTOR | Freq: Three times a day (TID) | SUBCUTANEOUS | Status: DC

## 2014-06-08 MED ORDER — HEPARIN SODIUM (PORCINE) 1000 UNIT/ML IJ SOLN
INTRAMUSCULAR | Status: AC
Start: 2014-06-08 — End: 2014-06-08
  Filled 2014-06-08: qty 1

## 2014-06-08 MED ORDER — DOCUSATE SODIUM 100 MG PO CAPS
100.0000 mg | ORAL_CAPSULE | Freq: Every day | ORAL | Status: DC | PRN
  Filled 2014-06-08 (×2): qty 1

## 2014-06-08 MED ORDER — LISINOPRIL 10 MG PO TABS
10.0000 mg | ORAL_TABLET | Freq: Every day | ORAL | Status: DC
  Administered 2014-06-09: 10 mg via ORAL
  Filled 2014-06-08: qty 1

## 2014-06-08 MED ORDER — FREESTYLE LITE DEVI: 1.0000 | Freq: Two times a day (BID) | Status: DC

## 2014-06-08 MED ORDER — SODIUM CHLORIDE 0.9 % IV SOLN: INTRAVENOUS | Status: AC

## 2014-06-08 MED ORDER — INSULIN ASPART 100 UNIT/ML ~~LOC~~ SOLN
5.0000 [IU] | Freq: Three times a day (TID) | SUBCUTANEOUS | Status: DC
  Administered 2014-06-08 – 2014-06-09 (×2): 5 [IU] via SUBCUTANEOUS

## 2014-06-08 MED ORDER — SODIUM CHLORIDE 0.9 % IJ SOLN: 3.0000 mL | Freq: Two times a day (BID) | INTRAMUSCULAR | Status: DC

## 2014-06-08 MED ORDER — FUROSEMIDE 20 MG PO TABS
20.0000 mg | ORAL_TABLET | Freq: Every day | ORAL | Status: DC
  Administered 2014-06-09: 20 mg via ORAL
  Filled 2014-06-08 (×2): qty 1

## 2014-06-08 MED ORDER — LIDOCAINE HCL (PF) 1 % IJ SOLN
INTRAMUSCULAR | Status: AC
  Filled 2014-06-08: qty 30

## 2014-06-08 MED ORDER — METOPROLOL TARTRATE 50 MG PO TABS
50.0000 mg | ORAL_TABLET | Freq: Two times a day (BID) | ORAL | Status: DC
  Administered 2014-06-08 – 2014-06-09 (×2): 50 mg via ORAL
  Filled 2014-06-08 (×3): qty 1

## 2014-06-08 MED ORDER — FENTANYL CITRATE 0.05 MG/ML IJ SOLN
INTRAMUSCULAR | Status: AC
  Administered 2014-06-08: 50 ug
  Filled 2014-06-08: qty 2

## 2014-06-08 MED ORDER — ASPIRIN 81 MG PO CHEW
162.0000 mg | CHEWABLE_TABLET | Freq: Every day | ORAL | Status: DC
  Administered 2014-06-09: 162 mg via ORAL
  Filled 2014-06-08: qty 2

## 2014-06-08 MED ORDER — FENTANYL CITRATE 0.05 MG/ML IJ SOLN
INTRAMUSCULAR | Status: AC
  Filled 2014-06-08: qty 2

## 2014-06-08 MED ORDER — HEPARIN SODIUM (PORCINE) 1000 UNIT/ML IJ SOLN
INTRAMUSCULAR | Status: AC
  Filled 2014-06-08: qty 1

## 2014-06-08 MED ORDER — HEPARIN (PORCINE) IN NACL 2-0.9 UNIT/ML-% IJ SOLN
INTRAMUSCULAR | Status: AC
  Filled 2014-06-08: qty 500

## 2014-06-08 MED ORDER — SODIUM CHLORIDE 0.9 % IJ SOLN: 3.0000 mL | INTRAMUSCULAR | Status: DC | PRN

## 2014-06-08 MED ORDER — MIDAZOLAM HCL 2 MG/2ML IJ SOLN
INTRAMUSCULAR | Status: AC
  Filled 2014-06-08: qty 2

## 2014-06-08 MED ORDER — COLLAGENASE 250 UNIT/GM EX OINT
1.0000 "application " | TOPICAL_OINTMENT | Freq: Every day | CUTANEOUS | Status: DC
  Filled 2014-06-08: qty 30

## 2014-06-08 MED ORDER — INSULIN GLARGINE 100 UNIT/ML ~~LOC~~ SOLN
60.0000 [IU] | Freq: Every day | SUBCUTANEOUS | Status: DC
  Administered 2014-06-08: 60 [IU] via SUBCUTANEOUS
  Filled 2014-06-08 (×2): qty 0.6

## 2014-06-08 NOTE — CV Procedure (Signed)
PERIPHERAL VASCULAR PROCEDURE  NAME:  Steven Maldonado   MRN: [id number] DOB:  08-11-1941   ADMIT DATE: 06/08/2014  Performing Cardiologist: [name] Primary Physician: [name], [name] Primary Cardiologist:  Dr. Julianne Handler  Procedures Performed:  Abdominal Aortic Angiogram with Bi-Iliofemoral Runoff  Selective left lower extremity arterial angiography  Selective right lower extremity arterial angiography  Right popliteal artery Angioscore balloon angioplasty followed by drug-coated balloon angioplasty     Indication(s):   Claudication with nonhealing ulcer of on the right heel    Consent: The procedure with Risks/Benefits/Alternatives and Indications was reviewed with the patient .  All questions were answered.  Medications:  Sedation:   2  mg IV Versed, 75  mcg IV Fentanyl  Contrast:   196 ml  Visipaque   Procedural details: The left groin was prepped, draped, and anesthetized with 1% lidocaine. Using modified Seldinger technique, a 5 French sheath was introduced into the left common femoral artery. A 5 Fr Short Pigtail Catheter was advanced of over a  Versicore wire into the descending Aorta to a level just above the renal arteries. A power injection of 10ml/sec contrast over 1 sec was performed for Abdominal Aortic Angiography.  The catheter was then pulled back to a level just above the Aortic bifurcation, and a second power injection was performed to evaluate the iliac arteries.   The catheter was then removed. At this time the injector was directed to the sheath SideArm and ipsilateral artery angiography was performed via power injection of 5  ml/sec contrast for a total of 35 ml.    The Versicore wire was advanced over A crossover catheter which was then pulled back the aortic bifurcation and the wire was advanced down the contralateral common iliac artery.  The wire was then advanced to the contralateral common femoral artery, the catheter was exchanged into  an end hole straight tip catheter which was advanced over the wire to the common femoral artery. Contralateral second-order lower extremity angiography was performed via power injection of 5 ml / sec contrast for a total of 35 ml.   Interventional Procedure:   in the sheath was removed and exchanged into a 7 French destination sheath which was placed with its tip in the right common femoral artery. The patient was given a total of 10,000 units of heparin. However, ACT was not therapeutic due to nonfunctioning IV. 7000 units of heparin was given with an ACT of 254. The Run Through wire was advanced over quick cross catheter .   I could not cross the lesion with this wire which was then exchanged into the [place] wire followed by a Treasuire 12 wire. These were not successful in crossing the lesion which was chronic and heavily calcified. I was able to finally cross the lesion with Confianza wire and was able to advance the quick cross catheter through the lesion. The wire was removed. Distal injection angiography confirmed intraluminal position. A [place] core wire was then advanced and the catheter was removed. The lesion was predilated with a 5 x 40 mm Angioscore balloon to 6 atmospheres and then to 8 atmospheres for 2 minutes. The balloon was fully expanded. Angiography showed heavy calcification there but there was no evidence of dissection. I then used a 5 x 40 mm Lutonix drug-coated balloon to 8 atmospheres for 2 minutes. Final angiography showed good results with 20% residual stenosis and heavy calcifications with no evidence of dissection.   the sheath was pulled back over a wire  and dilator and secured in place to be removed manually.  Hemodynamics:  Central Aortic Pressure / Mean Aortic Pressure:  188/86  Findings:  Abdominal aorta:  normal in size with no evidence of aneurysm. There is mild atherosclerosis.   Left renal artery: 20% proximal stenosis.   Right renal artery:  20% proximal  stenosis.   Celiac artery:  patent.   Superior mesenteric artery: patent   Right common iliac artery: minor irregularities.   Right internal iliac artery: minor irregularities.   Right external iliac artery: minor irregularities.   Right common femoral artery: normal   Right profunda femoral artery: normal   Right superficial femoral artery: 20% proximal stenosis with calcified 30 to 40% distal disease.   Right popliteal artery: 20% calcified proximal disease followed by total short occlusion in the midsegment. This segment is heavily calcified.   Right tibial peroneal trunk: minor irregularities.   Right anterior tibial artery: minor irregularities.   Right peroneal artery: minor irregularities.   Right posterior tibial artery:  occluded distally.   Left common iliac artery:   minor irregularities.   Left internal iliac artery:  minor irregularities proximally with severe diffuse disease distally.   Left external iliac artery:  patent   Left common femoral artery:  normal   Left profunda femoral artery:  20% mid stenosis   Left superficial femoral artery:   normal in size with no significant disease proximally. There is heavily calcified 30% disease.   Left popliteal artery:  occluded just above the knee with heavy calcifications with reconstitution via collaterals in the anterior tibial   Left tibial peroneal trunk:  70% proximal stenosis.   Left anterior tibial artery:  patent   Left peroneal artery:  normal in size with 70% proximal stenosis. The vessel is subtotally occluded distally.   Left posterior tibial artery:  patent with minor irregularities.   Conclusions:  1. No significant aortoiliac disease.   2. Chronically occluded left popliteal artery with 2 vessel runoff below the knee. There are reasonable collaterals. 3. Occluded right popliteal artery with poor collaterals. 2 vessel runoff below the knee with an occluded posterior tibial. 4. Successful  angioplasty and drug-coated balloon angioplasty of the right popliteal artery.  Recommendations:   hydrated overnight due to chronic kidney disease. Pradaxa can be resumed tomorrow if no bleeding complications. This should be in addition to aspirin. No need for Plavix given that no stenting was performed. Continue wound care of the right heel ulcer.    [name], MD, [facility] 06/08/2014 12:47 PM

## 2014-06-08 NOTE — Progress Notes (Signed)
Site area: left groin Site Prior to Removal:  Level 0 Pressure Applied For:25 minutes Manual:   yes Patient Status During Pull:  Stable; left DP pulse remained present with doppler during sheath pull Post Pull Site:  Level 0 Post Pull Instructions Given:  yes Post Pull Pulses Present:  Dressing Applied:  Tegaderm Bedrest begins @ 6834 Comments:No complications

## 2014-06-08 NOTE — Interval H&P Note (Signed)
History and Physical Interval Note:  06/08/2014 11:01 AM  Steven Maldonado  has presented today for surgery, with the diagnosis of pvd  The various methods of treatment have been discussed with the patient and family. After consideration of risks, benefits and other options for treatment, the patient has consented to  Procedure(s): ABDOMINAL ANGIOGRAM (N/A) LOWER EXTREMITY ANGIOGRAM (N/A) as a surgical intervention .  The patient's history has been reviewed, patient examined, no change in status, stable for surgery.  I have reviewed the patient's chart and labs.  Questions were answered to the patient's satisfaction.     [name]

## 2014-06-08 NOTE — H&P (View-Only) (Signed)
Primary Cardiologist: Dr. Angelena Form  HPI  This is a 73 year old male who was referred for evaluation of PAD. He has known hx of DM, HTN, hyperlipidemia, A-fib, moderate aortic stneosis and CAD.  He is s/p NSTEMI in the setting of atrial fibrillation with RVR in 12/2010.  Cardiac cath with occluded RCA with collaterals.Echo in December 2014 with LVEF=55-60%, moderate aortic stenosis, normal wall motion reported.   He reports prolonged history of mild right calf claudication. He underwent DCCV on 6/23. Since then, he noted significant worsening of right calf pain which is now happening with minimal walking. He noted a skin breakdown on the right heal 2 weeks ago which is not improving.    No Known Allergies   Current Outpatient Prescriptions on File Prior to Visit  Medication Sig Dispense Refill  . aspirin 81 MG tablet Take 1 tablet (81 mg total) by mouth daily.  30 tablet  0  . atorvastatin (LIPITOR) 80 MG tablet Take 1 tablet (80 mg total) by mouth at bedtime.  90 tablet  0  . Blood Glucose Monitoring Suppl (FREESTYLE LITE) DEVI 1 each by Does not apply route 2 (two) times daily.  1 each  0  . Choline Fenofibrate (TRILIPIX) 135 MG capsule Take 1 capsule (135 mg total) by mouth daily.  90 capsule  3  . dabigatran (PRADAXA) 150 MG CAPS Take 1 capsule (150 mg total) by mouth every 12 (twelve) hours.  180 capsule  3  . docusate sodium (COLACE) 100 MG capsule Take 1 capsule (100 mg total) by mouth as needed.  90 capsule  3  . furosemide (LASIX) 20 MG tablet Take 1 tablet (20 mg total) by mouth daily. Pt unsure of dose.  90 tablet  3  . glucose blood (FREESTYLE LITE) test strip 1 each by Other route 4 (four) times daily -  before meals and at bedtime.  150 each  11  . insulin glargine (LANTUS) 100 UNIT/ML injection Inject 60 Units into the skin as directed.       . insulin lispro (HUMALOG) 100 UNIT/ML KiwkPen Inject 5 Units into the skin 3 (three) times daily.      . Insulin Pen Needle 32G X 4 MM  MISC 1 each by Does not apply route 3 (three) times daily before meals.  100 each  11  . Insulin Syringe-Needle U-100 (BD INSULIN SYRINGE ULTRAFINE) 31G X 5/16" 1 ML MISC 1 Syringe by Does not apply route daily.  100 each  3  . lisinopril (PRINIVIL,ZESTRIL) 10 MG tablet TAKE 1 TABLET (10 MG TOTAL) BY MOUTH DAILY.  30 tablet  1  . metFORMIN (GLUMETZA) 1000 MG (MOD) 24 hr tablet Take 1 tablet (1,000 mg total) by mouth 2 (two) times daily.  180 tablet  3  . metoprolol (LOPRESSOR) 50 MG tablet Take 1 tablet (50 mg total) by mouth 2 (two) times daily.  180 tablet  0   No current facility-administered medications on file prior to visit.     Past Medical History  Diagnosis Date  . Atrial fibrillation     patient is on pradaxa  . Other and unspecified hyperlipidemia   . Unspecified essential hypertension   . Coronary artery disease     Cath February 2012 in [place] [facility], occluded RCA with collaterals  . Aortic stenosis     Moderate by echo 8/12  . Obstructive sleep apnea     2012  . DM type 2 (diabetes mellitus, type 2)   .  High cholesterol   . Heart disease   . CAD (coronary artery disease)   . Sleep apnea with use of continuous positive airway pressure (CPAP)     04-11-11 AHI was 32.9 and titrated to 15 cm H20, DME is [facility]  . Chronic kidney disease 08/30/2013  . 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 Cataract associated with type 2 diabetes mellitus 10/06/2013  . Myocardial infarction 12/2010     Past Surgical History  Procedure Laterality Date  . Appendectomy  1965  . Carduoversion  07/2011  . Skin cancer excision Bilateral     hand, face, neck  . Cardioversion N/A 04/18/2014    Procedure: CARDIOVERSION;  Surgeon: Dorothy Spark, MD;  Location: [facility] ENDOSCOPY;  Service: Cardiovascular;  Laterality: N/A;     Family History  Problem Relation Age of Onset  . Diabetes Mother   . Heart attack Father   . Heart failure Father   . Diabetes Other   . Diabetes Sister   . Diabetes  Brother   . Diabetes Daughter     TYPE ll  . Heart Problems Daughter      History   Social History  . Marital Status: Married    Spouse Name: N/A    Number of Children: 1  . Years of Education: 12   Occupational History  . retired    Social History Main Topics  . Smoking status: Former Smoker    Quit date: 11/11/1972  . Smokeless tobacco: Never Used  . Alcohol Use: No     Comment: quit in 1984  . Drug Use: No  . Sexual Activity: Not on file   Other Topics Concern  . Not on file   Social History Narrative  . No narrative on file     ROS A 10 point review of system was performed. It is negative other than that mentioned in the history of present illness.   PHYSICAL EXAM   BP 114/60  Pulse 70  Ht 5\' 11"  (1.803 m)  Wt 250 lb 1.9 oz (113.454 kg)  BMI 34.90 kg/m2 Constitutional: He is oriented to person, place, and time. He appears well-developed and well-nourished. No distress.  HENT: No nasal discharge.  Head: Normocephalic and atraumatic.  Eyes: Pupils are equal and round.  No discharge. Neck: Normal range of motion. Neck supple. No JVD present. No thyromegaly present.  Cardiovascular: Normal rate, regular rhythm, normal heart sounds. Exam reveals no gallop and no friction rub. No murmur heard.  Pulmonary/Chest: Effort normal and breath sounds normal. No stridor. No respiratory distress. He has no wheezes. He has no rales. He exhibits no tenderness.  Abdominal: Soft. Bowel sounds are normal. He exhibits no distension. There is no tenderness. There is no rebound and no guarding.  Musculoskeletal: Normal range of motion. He exhibits no edema and no tenderness.  Neurological: He is alert and oriented to person, place, and time. Coordination normal.  Skin: Skin is warm and dry. No rash noted. He is not diaphoretic. No erythema. No pallor.  Psychiatric: He has a normal mood and affect. His behavior is normal. Judgment and thought content normal.  Vascular: radial  pulse is very faint bilaterally , Femoral: right +2, left :+1, distal pulses are not palpable. There is 1 cm cut on right heal with no warmth or discharge.       ASSESSMENT AND PLAN

## 2014-06-09 ENCOUNTER — Other Ambulatory Visit: Payer: Self-pay | Admitting: Physician Assistant

## 2014-06-09 DIAGNOSIS — I4891 Unspecified atrial fibrillation: Secondary | ICD-10-CM

## 2014-06-09 DIAGNOSIS — E1159 Type 2 diabetes mellitus with other circulatory complications: Secondary | ICD-10-CM | POA: Diagnosis not present

## 2014-06-09 DIAGNOSIS — I10 Essential (primary) hypertension: Secondary | ICD-10-CM

## 2014-06-09 DIAGNOSIS — I739 Peripheral vascular disease, unspecified: Secondary | ICD-10-CM | POA: Diagnosis not present

## 2014-06-09 DIAGNOSIS — I798 Other disorders of arteries, arterioles and capillaries in diseases classified elsewhere: Secondary | ICD-10-CM

## 2014-06-09 LAB — BASIC METABOLIC PANEL
Anion gap: 14 (ref 5–15)
BUN: 15 mg/dL (ref 6–23)
CO2: 22 mEq/L (ref 19–32)
Calcium: 9.1 mg/dL (ref 8.4–10.5)
Chloride: 104 mEq/L (ref 96–112)
Creatinine, Ser: 1.22 mg/dL (ref 0.50–1.35)
GFR calc Af Amer: 68 mL/min — ABNORMAL LOW (ref >90)
GFR calc non Af Amer: 58 mL/min — ABNORMAL LOW (ref >90)
Glucose, Bld: 148 mg/dL — ABNORMAL HIGH (ref 70–99)
Potassium: 4.4 mEq/L (ref 3.7–5.3)
Sodium: 140 mEq/L (ref 137–147)

## 2014-06-09 LAB — GLUCOSE, CAPILLARY: Glucose-Capillary: 142 mg/dL — ABNORMAL HIGH (ref 70–99)

## 2014-06-09 MED ORDER — ASPIRIN 81 MG PO CHEW: 162.0000 mg | CHEWABLE_TABLET | Freq: Every day | ORAL | Status: DC

## 2014-06-09 MED ORDER — METFORMIN HCL 1000 MG PO TABS: 1000.0000 mg | ORAL_TABLET | Freq: Two times a day (BID) | ORAL | Status: DC

## 2014-06-09 NOTE — Discharge Summary (Signed)
Patient seen and examined and history reviewed. Agree with above findings and plan. See earlier rounding note.  Peter [place], [place] 06/09/2014 12:55 PM

## 2014-06-09 NOTE — Progress Notes (Signed)
Inpatient Diabetes Program Recommendations  AACE/ADA: New Consensus Statement on Inpatient Glycemic Control  Target Ranges:  Prepandial:   less than 140 mg/dL      Peak postprandial:   less than 180 mg/dL (1-2 hours)      Critically ill patients:  140 - 180 mg/dL  Pager:  [phone] Hours:  8 am-10pm   Reason for Visit: Elevated glucose:  Results for Steven Maldonado, Steven Maldonado (MRN [id number]) as of 06/09/2014 10:01  Ref. Range 06/08/2014 17:38 06/08/2014 21:13 06/09/2014 08:18 06/09/2014 08:39  Glucose-Capillary Latest Range: 70-99 mg/dL 188 (H) 215 (H)  142 (H)     Inpatient Diabetes Program Recommendations Correction (SSI): Consider adding Novolog Correction  Courtney Heys PhD, RN, BC-ADM Diabetes Coordinator  Office:  [phone] Team Pager:  [phone]

## 2014-06-09 NOTE — Progress Notes (Addendum)
Subjective: He reports his right foot feels cold.  Objective: Vital signs in last 24 hours: Temp:  [97.7 F (36.5 C)-98.3 F (36.8 C)] 98.1 F (36.7 C) (07/30 0500) Pulse Rate:  [57-78] 70 (07/30 0500) Resp:  [12-23] 18 (07/30 0500) BP: (112-172)/(22-101) 126/22 mmHg (07/30 0500) SpO2:  [96 %-100 %] 97 % (07/30 0500) Weight:  [248 lb 0.3 oz (112.5 kg)] 248 lb 0.3 oz (112.5 kg) (07/30 0017) Last BM Date: 06/07/14  Intake/Output from previous day: 07/29 0701 - 07/30 0700 In: 1240 [P.O.:240; I.V.:1000] Out: 1425 [Urine:1425] Intake/Output this shift: Total I/O In: 520 [P.O.:120; I.V.:400] Out: 975 [Urine:975]  Medications Current Facility-Administered Medications  Medication Dose Route Frequency Provider Last Rate Last Dose  . acetaminophen (TYLENOL) tablet 650 mg  650 mg Oral Q4H PRN Wellington Hampshire, MD      . aspirin chewable tablet 162 mg  162 mg Oral Daily Wellington Hampshire, MD      . atorvastatin (LIPITOR) tablet 80 mg  80 mg Oral QHS Wellington Hampshire, MD   80 mg at 06/08/14 2119  . cephALEXin (KEFLEX) capsule 500 mg  500 mg Oral BID Wellington Hampshire, MD   500 mg at 06/08/14 2119  . collagenase (SANTYL) ointment 1 application  1 application Topical Daily Wellington Hampshire, MD      . docusate sodium (COLACE) capsule 100 mg  100 mg Oral Daily PRN Wellington Hampshire, MD      . fenofibrate tablet 160 mg  160 mg Oral Daily Wellington Hampshire, MD      . furosemide (LASIX) tablet 20 mg  20 mg Oral Daily Wellington Hampshire, MD      . insulin aspart (novoLOG) injection 5 Units  5 Units Subcutaneous TID WC Wellington Hampshire, MD   5 Units at 06/08/14 1830  . insulin glargine (LANTUS) injection 60 Units  60 Units Subcutaneous Daily Wellington Hampshire, MD   60 Units at 06/08/14 2119  . lisinopril (PRINIVIL,ZESTRIL) tablet 10 mg  10 mg Oral Daily Wellington Hampshire, MD      . metoprolol (LOPRESSOR) tablet 50 mg  50 mg Oral BID Wellington Hampshire, MD   50 mg at 06/08/14 2119    PE: General  appearance: alert, cooperative and no distress Lungs: clear to auscultation bilaterally Heart: regular rate and rhythm and 1/6 sys MM Extremities: No LEE Pulses: 2+ radials, both feet are warm. Skin: warm and dry.  wound on right heel Neurologic: Grossly normal  Lab Results:   Recent Labs  06/08/14 0650  WBC 6.1  HGB 12.2*  HCT 36.8*  PLT 193   BMET  Recent Labs  06/08/14 0650  NA 142  K 4.4  CL 104  CO2 22  GLUCOSE 133*  BUN 16  CREATININE 1.42*  CALCIUM 9.5   PT/INR  Recent Labs  06/08/14 0650  LABPROT 13.4  INR 1.02     Assessment/Plan   Principal Problem:   PAD (peripheral artery disease) Active Problems:   HYPERLIPIDEMIA-MIXED   HYPERTENSION, BENIGN   Atrial fibrillation   Aortic stenosis   DM type 2 (diabetes mellitus, type 2)   Obesity (BMI 30-39.9)   OSA on CPAP   Chronic kidney disease  Plan:  SP LE PV angiogram.  No significant aortoiliac disease. Chronically occluded left popliteal artery with 2 vessel runoff below the knee. There are reasonable collaterals.  Occluded right popliteal artery with poor collaterals. 2 vessel runoff below the  knee with an occluded posterior tibial.  Successful angioplasty and drug-coated balloon angioplasty of the right popliteal artery.  ASA.  Resume pradaxa this morning.  Maintaining NSR.   Repeat right LEA dopplers in two weeks.     LOS: 1 day    HAGER, BRYAN [name] 06/09/2014 6:50 AM  Patient seen and examined and history reviewed. Agree with above findings and plan. Patient without complaints this am. Right foot warm and dry. Wound dressed. Left groin access site with mild bruising medially. No hematoma or bruit. Will DC home today. Follow up dopplers in 2 weeks. OK to resume Pradaxa today.  Jenascia Bumpass [place], [place] 06/09/2014 8:17 AM

## 2014-06-09 NOTE — Discharge Summary (Signed)
Physician Discharge Summary     Cardiologist:  McAlhany/Arida(PV)  Patient ID: Steven Maldonado MRN: [id number] DOB/AGE: [birth year] 73 y.o.  Admit date: 06/08/2014 Discharge date: 06/09/2014  Admission Diagnoses:  PAD  Discharge Diagnoses:  Principal Problem:   PAD (peripheral artery disease) Active Problems:   HYPERLIPIDEMIA-MIXED   HYPERTENSION, BENIGN   Atrial fibrillation   Aortic stenosis   DM type 2 (diabetes mellitus, type 2)   Obesity (BMI 30-39.9)   OSA on CPAP   Chronic kidney disease   Discharged Condition: stable  Hospital Course:   73 year old male who was referred for evaluation of PAD. He has known hx of DM, HTN, hyperlipidemia, A-fib, moderate aortic stneosis and CAD. He is s/p NSTEMI in the setting of atrial fibrillation with RVR in 12/2010. Cardiac cath with occluded RCA with collaterals.  Echo in December 2014 with LVEF=55-60%, moderate aortic stenosis, normal wall motion reported.  He reports prolonged history of mild right calf claudication. He underwent DCCV on 6/23. Since then, he noted significant worsening of right calf pain which is now happening with minimal walking. He noted a skin breakdown on the right heal 2 weeks ago which is not improving.  LEA dopplers on 05/03/14: right 0.77, left 0.78.   The patient presented for PV angiogram(see results below) which revealed a chronically occluded left popliteal artery with 2 vessel runoff below the knee with reasonable collaterals.  Occluded right popliteal artery with poor collaterals. 2 vessel runoff below the knee with an occluded posterior tibial.  He underwent successful angioplasty and drug-coated balloon angioplasty of the right popliteal artery.  SCr improved the day after the procedure.  The patient was seen by Dr. Martinique who felt he was stable for DC home. Will repeat LEA dopplers on the right in two weeks to assess popliteal a.     Consults: None  Significant Diagnostic Studies:   PERIPHERAL VASCULAR  PROCEDURE  NAME: Steven Maldonado MRN: [id number]  DOB: 09/19/1941 ADMIT DATE: 06/08/2014  Performing Cardiologist: [name]  Primary Physician: [name], [name]  Primary Cardiologist: Dr. Julianne Handler  Procedures Performed:  Abdominal Aortic Angiogram with Bi-Iliofemoral Runoff  Selective left lower extremity arterial angiography  Selective right lower extremity arterial angiography  Right popliteal artery Angioscore balloon angioplasty followed by drug-coated balloon angioplasty  Indication(s):  Claudication with nonhealing ulcer of on the right heel Consent: The procedure with Risks/Benefits/Alternatives and Indications was reviewed with the patient . All questions were answered.  Medications:  Sedation: 2 mg IV Versed, 75 mcg IV Fentanyl  Contrast: 196 ml Visipaque  Procedural details: The left groin was prepped, draped, and anesthetized with 1% lidocaine. Using modified Seldinger technique, a 5 French sheath was introduced into the left common femoral artery. A 5 Fr Short Pigtail Catheter was advanced of over a Versicore wire into the descending Aorta to a level just above the renal arteries.  A power injection of 74ml/sec contrast over 1 sec was performed for Abdominal Aortic Angiography. The catheter was then pulled back to a level just above the Aortic bifurcation, and a second power injection was performed to evaluate the iliac arteries.  The catheter was then removed. At this time the injector was directed to the sheath SideArm and ipsilateral artery angiography was performed via power injection of 5 ml/sec contrast for a total of 35 ml.  The Versicore wire was advanced over A crossover catheter which was then pulled back the aortic bifurcation and the wire was advanced down the contralateral common iliac artery.  The wire was then advanced to the contralateral common femoral artery, the catheter was exchanged into an end hole straight tip catheter which was advanced over the wire to  the common femoral artery. Contralateral second-order lower extremity angiography was performed via power injection of 5 ml / sec contrast for a total of 35 ml.  Interventional Procedure:  in the sheath was removed and exchanged into a 7 French destination sheath which was placed with its tip in the right common femoral artery. The patient was given a total of 10,000 units of heparin. However, ACT was not therapeutic due to nonfunctioning IV. 7000 units of heparin was given with an ACT of 254. The Run Through wire was advanced over quick cross catheter . I could not cross the lesion with this wire which was then exchanged into the Regalia wire followed by a Treasuire 12 wire. These were not successful in crossing the lesion which was chronic and heavily calcified. I was able to finally cross the lesion with Confianza wire and was able to advance the quick cross catheter through the lesion. The wire was removed. Distal injection angiography confirmed intraluminal position. A [place]a core wire was then advanced and the catheter was removed. The lesion was predilated with a 5 x 40 mm Angioscore balloon to 6 atmospheres and then to 8 atmospheres for 2 minutes. The balloon was fully expanded. Angiography showed heavy calcification there but there was no evidence of dissection. I then used a 5 x 40 mm Lutonix drug-coated balloon to 8 atmospheres for 2 minutes. Final angiography showed good results with 20% residual stenosis and heavy calcifications with no evidence of dissection.  the sheath was pulled back over a wire and dilator and secured in place to be removed manually.  Hemodynamics:  Central Aortic Pressure / Mean Aortic Pressure: 188/86  Findings:  Abdominal aorta: normal in size with no evidence of aneurysm. There is mild atherosclerosis.  Left renal artery: 20% proximal stenosis.  Right renal artery: 20% proximal stenosis.  Celiac artery: patent.  Superior mesenteric artery: patent  Right common iliac  artery: minor irregularities.  Right internal iliac artery: minor irregularities.  Right external iliac artery: minor irregularities.  Right common femoral artery: normal  Right profunda femoral artery: normal  Right superficial femoral artery: 20% proximal stenosis with calcified 30 to 40% distal disease.  Right popliteal artery: 20% calcified proximal disease followed by total short occlusion in the midsegment. This segment is heavily calcified.  Right tibial peroneal trunk: minor irregularities.  Right anterior tibial artery: minor irregularities.  Right peroneal artery: minor irregularities.  Right posterior tibial artery: occluded distally.  Left common iliac artery: minor irregularities.  Left internal iliac artery: minor irregularities proximally with severe diffuse disease distally.  Left external iliac artery: patent  Left common femoral artery: normal  Left profunda femoral artery: 20% mid stenosis  Left superficial femoral artery: normal in size with no significant disease proximally. There is heavily calcified 30% disease.  Left popliteal artery: occluded just above the knee with heavy calcifications with reconstitution via collaterals in the anterior tibial  Left tibial peroneal trunk: 70% proximal stenosis.  Left anterior tibial artery: patent  Left peroneal artery: normal in size with 70% proximal stenosis. The vessel is subtotally occluded distally.  Left posterior tibial artery: patent with minor irregularities.  Conclusions:  1. No significant aortoiliac disease.  2. Chronically occluded left popliteal artery with 2 vessel runoff below the knee. There are reasonable collaterals.  3. Occluded right popliteal  artery with poor collaterals. 2 vessel runoff below the knee with an occluded posterior tibial.  4. Successful angioplasty and drug-coated balloon angioplasty of the right popliteal artery.  Recommendations:  hydrated overnight due to chronic kidney disease. Pradaxa  can be resumed tomorrow if no bleeding complications. This should be in addition to aspirin. No need for Plavix given that no stenting was performed. Continue wound care of the right heel ulcer.  [name], MD, [facility]  06/08/2014  12:47 PM  Treatments: See above  Discharge Exam: Blood pressure 126/22, pulse 70, temperature 98.1 F (36.7 C), temperature source Oral, resp. rate 18, height 5\' 11"  (1.803 m), weight 248 lb 0.3 oz (112.5 kg), SpO2 97.00%.   Disposition: 01-Home or Self Care      Discharge Instructions   Diet - low sodium heart healthy    Complete by:  As directed      Discharge instructions    Complete by:  As directed   No lifting more than a half gallon of milk or driving for three days.     Increase activity slowly    Complete by:  As directed             Medication List    STOP taking these medications       aspirin 81 MG tablet  Replaced by:  aspirin 81 MG chewable tablet      TAKE these medications       aspirin 81 MG chewable tablet  Chew 2 tablets (162 mg total) by mouth daily.     atorvastatin 80 MG tablet  Commonly known as:  LIPITOR  Take 1 tablet (80 mg total) by mouth at bedtime.     cephALEXin 500 MG capsule  Commonly known as:  KEFLEX  Take 500 mg by mouth 2 (two) times daily.     Choline Fenofibrate 135 MG capsule  Commonly known as:  TRILIPIX  Take 1 capsule (135 mg total) by mouth daily.     collagenase ointment  Commonly known as:  SANTYL  Apply 1 application topically daily.     dabigatran 150 MG Caps capsule  Commonly known as:  PRADAXA  Take 1 capsule (150 mg total) by mouth every 12 (twelve) hours.     docusate sodium 100 MG capsule  Commonly known as:  COLACE  Take 100 mg by mouth daily as needed for mild constipation.     FREESTYLE LITE Devi  1 each by Does not apply route 2 (two) times daily.     furosemide 20 MG tablet  Commonly known as:  LASIX  Take 20 mg by mouth daily.     glucose blood test strip    Commonly known as:  FREESTYLE LITE  1 each by Other route 4 (four) times daily -  before meals and at bedtime.     insulin glargine 100 UNIT/ML injection  Commonly known as:  LANTUS  Inject 0.6 mLs (60 Units total) into the skin daily.     insulin lispro 100 UNIT/ML KiwkPen  Commonly known as:  HUMALOG  Inject 5 Units into the skin 3 (three) times daily.     Insulin Pen Needle 32G X 4 MM Misc  1 each by Does not apply route 3 (three) times daily before meals.     Insulin Syringe-Needle U-100 31G X 5/16" 1 ML Misc  Commonly known as:  BD INSULIN SYRINGE ULTRAFINE  1 Syringe by Does not apply route daily.     lisinopril  10 MG tablet  Commonly known as:  PRINIVIL,ZESTRIL  Take 1 tablet (10 mg total) by mouth daily.     metFORMIN 1000 MG tablet  Commonly known as:  GLUCOPHAGE  Take 1 tablet (1,000 mg total) by mouth 2 (two) times daily with a meal.     metoprolol 50 MG tablet  Commonly known as:  LOPRESSOR  Take 1 tablet (50 mg total) by mouth 2 (two) times daily.       Follow-up Information   Follow up with [name], MD On 06/28/2014. (9:30 AM)    Specialty:  Cardiology   Contact information:   [address] [place] [address] [phone]       Follow up with Lower Extremity Dopplers. (The office will call you with the appt. It should be around Aug 13 and before your follow up with Dr. Fletcher Anon.)    Contact information:   [address] [place] [address] [phone]     Greater than 30 minutes was spent completing the patient's discharge.    SignedTarri Fuller, [name] 06/09/2014, 10:38 AM

## 2014-06-14 ENCOUNTER — Encounter (HOSPITAL_BASED_OUTPATIENT_CLINIC_OR_DEPARTMENT_OTHER): Payer: Medicare Other | Attending: General Surgery

## 2014-06-14 DIAGNOSIS — I739 Peripheral vascular disease, unspecified: Secondary | ICD-10-CM | POA: Diagnosis not present

## 2014-06-14 DIAGNOSIS — E1169 Type 2 diabetes mellitus with other specified complication: Secondary | ICD-10-CM | POA: Insufficient documentation

## 2014-06-14 DIAGNOSIS — L97409 Non-pressure chronic ulcer of unspecified heel and midfoot with unspecified severity: Secondary | ICD-10-CM | POA: Insufficient documentation

## 2014-06-15 ENCOUNTER — Other Ambulatory Visit (HOSPITAL_COMMUNITY): Payer: Self-pay | Admitting: *Deleted

## 2014-06-15 DIAGNOSIS — I739 Peripheral vascular disease, unspecified: Secondary | ICD-10-CM

## 2014-06-21 ENCOUNTER — Institutional Professional Consult (permissible substitution): Payer: Medicare Other | Admitting: Cardiovascular Disease

## 2014-06-21 DIAGNOSIS — E1169 Type 2 diabetes mellitus with other specified complication: Secondary | ICD-10-CM | POA: Diagnosis not present

## 2014-06-21 DIAGNOSIS — I739 Peripheral vascular disease, unspecified: Secondary | ICD-10-CM | POA: Diagnosis not present

## 2014-06-21 DIAGNOSIS — L97409 Non-pressure chronic ulcer of unspecified heel and midfoot with unspecified severity: Secondary | ICD-10-CM | POA: Diagnosis not present

## 2014-06-22 ENCOUNTER — Ambulatory Visit (HOSPITAL_COMMUNITY): Payer: Medicare Other | Attending: Cardiovascular Disease | Admitting: Cardiology

## 2014-06-22 DIAGNOSIS — E119 Type 2 diabetes mellitus without complications: Secondary | ICD-10-CM | POA: Diagnosis not present

## 2014-06-22 DIAGNOSIS — I739 Peripheral vascular disease, unspecified: Secondary | ICD-10-CM

## 2014-06-22 DIAGNOSIS — I2510 Atherosclerotic heart disease of native coronary artery without angina pectoris: Secondary | ICD-10-CM | POA: Diagnosis not present

## 2014-06-22 DIAGNOSIS — I10 Essential (primary) hypertension: Secondary | ICD-10-CM | POA: Diagnosis not present

## 2014-06-22 DIAGNOSIS — Z87891 Personal history of nicotine dependence: Secondary | ICD-10-CM | POA: Insufficient documentation

## 2014-06-22 NOTE — Progress Notes (Signed)
ABI performed  

## 2014-06-28 ENCOUNTER — Ambulatory Visit (INDEPENDENT_AMBULATORY_CARE_PROVIDER_SITE_OTHER): Payer: Medicare Other | Admitting: Cardiovascular Disease

## 2014-06-28 ENCOUNTER — Encounter: Payer: Self-pay | Admitting: Cardiovascular Disease

## 2014-06-28 VITALS — BP 132/60 | HR 66 | Ht 71.0 in | Wt 254.4 lb

## 2014-06-28 DIAGNOSIS — I2510 Atherosclerotic heart disease of native coronary artery without angina pectoris: Secondary | ICD-10-CM

## 2014-06-28 DIAGNOSIS — I4891 Unspecified atrial fibrillation: Secondary | ICD-10-CM

## 2014-06-28 DIAGNOSIS — I739 Peripheral vascular disease, unspecified: Secondary | ICD-10-CM

## 2014-06-28 DIAGNOSIS — E1169 Type 2 diabetes mellitus with other specified complication: Secondary | ICD-10-CM | POA: Diagnosis not present

## 2014-06-28 DIAGNOSIS — L97409 Non-pressure chronic ulcer of unspecified heel and midfoot with unspecified severity: Secondary | ICD-10-CM | POA: Diagnosis not present

## 2014-06-28 NOTE — Assessment & Plan Note (Signed)
He is on anticoagulation with Pradaxa with no bleeding complications.

## 2014-06-28 NOTE — Patient Instructions (Signed)
Your physician has requested that you have a lower extremity arterial duplex in 3 MONTHS. This test is an ultrasound of the arteries in the legs. It looks at arterial blood flow in the legs. Allow one hour for Lower Arterial scans. There are no restrictions or special instructions  Your physician recommends that you schedule a follow-up appointment in: 3 MONTHS with Dr Fletcher Anon  Your physician recommends that you continue on your current medications as directed. Please refer to the Current Medication list given to you today.

## 2014-06-28 NOTE — Assessment & Plan Note (Signed)
The patient is doing very well after recent drug-coated balloon angioplasty to treat chronically occluded right popliteal artery. Claudication resolved. Ulceration is healing. Post procedure ABI improved to 0.86. He still has chronically occluded left popliteal artery but has reasonable collaterals and complains of no claudication. Recommend continuing medical therapy. Check lower extremity arterial duplex in 3 months from now.
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
## 2014-07-02 ENCOUNTER — Other Ambulatory Visit: Payer: Self-pay | Admitting: Physician Assistant

## 2014-07-05 ENCOUNTER — Encounter: Payer: Self-pay | Admitting: Cardiovascular Disease

## 2014-07-05 DIAGNOSIS — E1169 Type 2 diabetes mellitus with other specified complication: Secondary | ICD-10-CM | POA: Diagnosis not present

## 2014-07-05 DIAGNOSIS — I739 Peripheral vascular disease, unspecified: Secondary | ICD-10-CM | POA: Diagnosis not present

## 2014-07-05 DIAGNOSIS — L97409 Non-pressure chronic ulcer of unspecified heel and midfoot with unspecified severity: Secondary | ICD-10-CM | POA: Diagnosis not present

## 2014-07-12 ENCOUNTER — Encounter (HOSPITAL_BASED_OUTPATIENT_CLINIC_OR_DEPARTMENT_OTHER): Payer: Medicare Other | Attending: General Surgery

## 2014-07-12 DIAGNOSIS — E1169 Type 2 diabetes mellitus with other specified complication: Secondary | ICD-10-CM | POA: Insufficient documentation

## 2014-07-12 DIAGNOSIS — L97409 Non-pressure chronic ulcer of unspecified heel and midfoot with unspecified severity: Secondary | ICD-10-CM | POA: Diagnosis not present

## 2014-07-14 ENCOUNTER — Encounter: Payer: Self-pay | Admitting: Neurology

## 2014-07-14 ENCOUNTER — Ambulatory Visit (INDEPENDENT_AMBULATORY_CARE_PROVIDER_SITE_OTHER): Payer: Medicare Other | Admitting: Neurology

## 2014-07-14 VITALS — BP 137/57 | HR 58 | Resp 17 | Ht 72.0 in | Wt 249.0 lb

## 2014-07-14 DIAGNOSIS — Z9989 Dependence on other enabling machines and devices: Secondary | ICD-10-CM

## 2014-07-14 DIAGNOSIS — L97409 Non-pressure chronic ulcer of unspecified heel and midfoot with unspecified severity: Secondary | ICD-10-CM | POA: Diagnosis not present

## 2014-07-14 DIAGNOSIS — I2510 Atherosclerotic heart disease of native coronary artery without angina pectoris: Secondary | ICD-10-CM

## 2014-07-14 DIAGNOSIS — E1169 Type 2 diabetes mellitus with other specified complication: Secondary | ICD-10-CM | POA: Diagnosis not present

## 2014-07-14 DIAGNOSIS — G4733 Obstructive sleep apnea (adult) (pediatric): Secondary | ICD-10-CM

## 2014-07-14 DIAGNOSIS — E11621 Type 2 diabetes mellitus with foot ulcer: Secondary | ICD-10-CM

## 2014-07-14 NOTE — Progress Notes (Signed)
[facility] Sleep clinic   Provider:  Larey Seat, M D  Referring Provider: [name], [name] Primary Care Physician:  [name], [name]  Chief Complaint  Patient presents with  . Follow-up    Room 10  . Sleep Apnea    HPI:  Steven Maldonado is a 73 y.o. male  Is seen here as a revisit from Dr. Doren Custard for follow up on CPAP compliance.   Steven Maldonado is seen 07-14-14 for his yearly revisit. The patient has been using her CPAP machine compliantly for an average of 5 hours and 27 minutes nightly, his residual AHI is 1.0 indicating a very good response.  Currently has machine is set at 14 cm pressure the 3 cm EPR he does have some significant air leaks and his compliance is 71%. Steven Maldonado has switched to medical equipment company from previously advanced on care to a local office and asked oral require yearly download and I would like for these to be available at his September yearly visit. His response to the CPAP indicates that he is indeed helped by CPAP he was.  His geriatric depression score was  Zero , Epworth sleepiness score at 2 , the fatigue severity score at 11 points.  These are excellent results. The patient had no falls in the last 6 months. He had a DVT at the right knee, and underwent an angioplasty of the leg artery, developed an ulcer at the heel- he [place] seen at  the wound care center. He is walking daily with a cane, but this is a problem with weight on the heel. He gained some weight . His sleep habits have not changed. He wakes up refreshed and restored.    Last visit note 2014. CD [name] was originally diagnosed with an AHI of 39 of 32.9. Titrated to CPAP to 15 cm and changed temporarily to a BiPAP. The patient did however advanced under 14 cm CPAP and has continued to use his machine at this setting. His past medical history was hypertension, diabetes, insulin-dependent, hyperlipidemia, coronary artery disease, and atrial fibrillation. He recently  got an update from his cardiologist  ,stating that his heart was doing well and that there seems to be in no acute problem in terms of atrial fibrillation or his valve.  The patient's cardiologist is Dr. [facility]. The patient had suffered a heart attack and CABG 2012.  There is no shift work history, his bedtime is around midnight and since his retirement he doesn't need to raise the right in the morning - sleeps until  8 or 9 AM. He considers his sleep refreshing, has one nocturia He still sometimes has a dry mouth. He had reported that this water reservoir of the CPAP machine gets depleted every night and continues to do. He has a full face mask.  Was able today to compare the CPAP download from September 2013 with the download from this year 2014 . The patient average usage for CPAP is 5 hours and 45 minutes his 100% compliance, his residual AHI is 0.5% pressure is now 14 cm EPI level is 3 cm water. The air leak has been reduced since he changed to a smaller version of a full face mask.  2014 :GDS score is 4, the patient had more falls in the last 6 months.Epworth is 2 .          Review of Systems: Out of a complete 14 system review, the patient complains of only  the following symptoms, and all other reviewed systems are negative. FSS 11, epworth 2, 71% compliant with CPAP at 14 cm water.  Medium sized mirage Quattro FFM - he failed a nasal pillow and nasal mask- air leaked orally. No facial marks. [name] Kitchen   History   Social History  . Marital Status: Married    Spouse Name: Steven Maldonado    Number of Children: 1  . Years of Education: 12   Occupational History  . retired    Social History Main Topics  . Smoking status: Former Smoker -- 2.00 packs/day for 14 years    Types: Cigarettes    Quit date: 11/11/1972  . Smokeless tobacco: Never Used  . Alcohol Use: No     Comment: quit in 1984  . Drug Use: No  . Sexual Activity: Not Currently   Other Topics Concern  . Not on  file   Social History Narrative   Patient is married Engineer, drilling) and lives at home with his wife.   Patient has one child and his wife has one child.   Patient is retired.   Patient has a high school education.   Patient is right-handed.   Patient drinks very little caffeine.    Family History  Problem Relation Age of Onset  . Diabetes Mother   . Heart attack Father   . Heart failure Father   . Diabetes Other   . Diabetes Sister   . Diabetes Brother   . Diabetes Daughter     TYPE ll  . Heart Problems Daughter     Past Medical History  Diagnosis Date  . Atrial fibrillation     patient is on pradaxa  . Other and unspecified hyperlipidemia   . Unspecified essential hypertension   . Coronary artery disease     Cath February 2012 in [place] [facility], occluded RCA with collaterals  . Aortic stenosis     Moderate by echo 8/12  . High cholesterol   . Heart disease   . CAD (coronary artery disease)   . Chronic kidney disease 08/30/2013  . 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 Heart murmur   . DM type 2 (diabetes mellitus, type 2)   . Cataract associated with type 2 diabetes mellitus 10/06/2013  . Obstructive sleep apnea     2012  . Sleep apnea with use of continuous positive airway pressure (CPAP)     04-11-11 AHI was 32.9 and titrated to 15 cm H20, DME is [facility]  . Skin cancer     "cut off back of neck X 2; off left upper arm; right wrist, near right shoulder blade; several burned off other areas of my body" (06/08/2014)  . NSTEMI (non-ST elevated myocardial infarction) 12/2010    Past Surgical History  Procedure Laterality Date  . Appendectomy  1965  . Cardioversion  07/2011  . Skin cancer excision Bilateral     "have had them cut off back of neck X 2; off left upper arm; right wrist, near right shoulder blade" (06/08/2014)  . Cardioversion N/A 04/18/2014    Procedure: CARDIOVERSION;  Surgeon: Dorothy Spark, MD;  Location: [facility];  Service: Cardiovascular;  Laterality:  N/A;  . Popliteal artery angioplasty Right 06/08/2014    Archie Endo 06/08/2014  . Cardiac catheterization  12/2010    Current Outpatient Prescriptions  Medication Sig Dispense Refill  . aspirin 81 MG tablet Take 81 mg by mouth daily.      [name] Kitchen atorvastatin (LIPITOR) 80 MG tablet Take 1 tablet (80  mg total) by mouth at bedtime.  90 tablet  0  . Blood Glucose Monitoring Suppl (FREESTYLE LITE) DEVI 1 each by Does not apply route 2 (two) times daily.  1 each  0  . Choline Fenofibrate (TRILIPIX) 135 MG capsule Take 1 capsule (135 mg total) by mouth daily.  90 capsule  3  . collagenase (SANTYL) ointment Apply 1 application topically daily.      [name] Kitchen docusate sodium (COLACE) 100 MG capsule Take 100 mg by mouth daily as needed for mild constipation.      . furosemide (LASIX) 20 MG tablet Take 20 mg by mouth daily.      [name] Kitchen glucose blood (FREESTYLE LITE) test strip 1 each by Other route 4 (four) times daily -  before meals and at bedtime.  150 each  11  . insulin glargine (LANTUS) 100 UNIT/ML injection Inject 0.6 mLs (60 Units total) into the skin daily.  60 mL  3  . insulin lispro (HUMALOG) 100 UNIT/ML KiwkPen Inject 5 Units into the skin 3 (three) times daily.      . Insulin Pen Needle 32G X 4 MM MISC 1 each by Does not apply route 3 (three) times daily before meals.  100 each  11  . Insulin Syringe-Needle U-100 (BD INSULIN SYRINGE ULTRAFINE) 31G X 5/16" 1 ML MISC 1 Syringe by Does not apply route daily.  100 each  3  . lisinopril (PRINIVIL,ZESTRIL) 10 MG tablet Take 1 tablet (10 mg total) by mouth daily.  90 tablet  3  . metFORMIN (GLUCOPHAGE) 1000 MG tablet Take 1 tablet (1,000 mg total) by mouth 2 (two) times daily with a meal.      . metoprolol (LOPRESSOR) 50 MG tablet Take 1 tablet (50 mg total) by mouth 2 (two) times daily.  180 tablet  0  . PRADAXA 150 MG CAPS capsule TAKE 1 CAPSULE EVERY 12    HOURS  180 capsule  3   No current facility-administered medications for this visit.    Allergies as of  07/14/2014  . (No Known Allergies)    Vitals: BP 137/57  Pulse 58  Resp 17  Ht 6' (1.829 m)  Wt 249 lb (112.946 kg)  BMI 33.76 kg/m2 Last Weight:  Wt Readings from Last 1 Encounters:  07/14/14 249 lb (112.946 kg)   Last Height:   Ht Readings from Last 1 Encounters:  07/14/14 6' (1.829 m)   Physical exam:  General: The patient is awake, alert and appears not in acute distress. The patient is well groomed. Head: Normocephalic, atraumatic. Neck is supple. Mallampati 3 , neck circumference: 19.5 inches,  no nasal deviation, mld dysarthria, dysphonia.  Cardiovascular:  Irregular, without  murmurs or carotid bruit, and without distended neck veins. Respiratory: Lungs are clear to auscultation. Skin:  Without evidence of edema, or rash Trunk: BMI is elevated . Patient has normal posture.  Neurologic exam : The patient is awake and alert, oriented to place and time.   Memory subjective  described as intact. There is a normal attention span & concentration ability.  Speech is fluent without aphasia. Mood and affect are appropriate.  Cranial nerves: Pupils are equal and briskly reactive to light. Funduscopic exam without  evidence of pallor or edema.  Extraocular movements  in vertical and horizontal planes intact and without nystagmus. Visual fields by finger perimetry are intact. Hearing to finger rub intact.  Facial sensation intact to fine touch. Facial motor strength is symmetric and tongue and uvula move  midline.  Motor exam:   Normal tone and normal muscle bulk and symmetric normal strength in all extremities.  Sensory:  Fine touch, pinprick and vibration were tested in all extremities. Proprioception is normal.  Coordination: Rapid alternating movements in the fingers/hands is tested and normal. Finger-to-nose maneuver normal.  Gait and station: Patient walks without assistive device - Strength within normal limits. Stance is stable and normal.  Assessment:   OSA with  overweight patient , and higher grade Mallampati AHI  is 0.5 on CPAP of 14 cm water. Continue use.  100% compliance. His DME is in [place].    Plan:  Treatment plan and additional workup :  CPAP at 14 cm water, no adjustment needed.  Wound healing progress is satisfying.  Weight reduction discussed. Main risk factor for this patient.  RV with NP in 12 month.

## 2014-07-14 NOTE — Addendum Note (Signed)
Addended by: Larey Seat on: 07/14/2014 01:45 PM   Modules accepted: Orders

## 2014-07-14 NOTE — Patient Instructions (Signed)
Sleep Apnea  Sleep apnea is a sleep disorder characterized by abnormal pauses in breathing while you sleep. When your breathing pauses, the level of oxygen in your blood decreases. This causes you to move out of deep sleep and into light sleep. As a result, your quality of sleep is poor, and the system that carries your blood throughout your body (cardiovascular system) experiences stress. If sleep apnea remains untreated, the following conditions can develop:  High blood pressure (hypertension).  Coronary artery disease.  Inability to achieve or maintain an erection (impotence).  Impairment of your thought process (cognitive dysfunction). There are three types of sleep apnea: 1. Obstructive sleep apnea--Pauses in breathing during sleep because of a blocked airway. 2. Central sleep apnea--Pauses in breathing during sleep because the area of the brain that controls your breathing does not send the correct signals to the muscles that control breathing. 3. Mixed sleep apnea--A combination of both obstructive and central sleep apnea. RISK FACTORS The following risk factors can increase your risk of developing sleep apnea:  Being overweight.  Smoking.  Having narrow passages in your nose and throat.  Being of older age.  Being male.  Alcohol use.  Sedative and tranquilizer use.  Ethnicity. Among individuals younger than 35 years, African Americans are at increased risk of sleep apnea. SYMPTOMS   Difficulty staying asleep.  Daytime sleepiness and fatigue.  Loss of energy.  Irritability.  Loud, heavy snoring.  Morning headaches.  Trouble concentrating.  Forgetfulness.  Decreased interest in sex. DIAGNOSIS  In order to diagnose sleep apnea, your caregiver will perform a physical examination. Your caregiver may suggest that you take a home sleep test. Your caregiver may also recommend that you spend the night in a sleep lab. In the sleep lab, several monitors record  information about your heart, lungs, and brain while you sleep. Your leg and arm movements and blood oxygen level are also recorded. TREATMENT The following actions may help to resolve mild sleep apnea:  Sleeping on your side.   Using a decongestant if you have nasal congestion.   Avoiding the use of depressants, including alcohol, sedatives, and narcotics.   Losing weight and modifying your diet if you are overweight. There also are devices and treatments to help open your airway:  Oral appliances. These are custom-made mouthpieces that shift your lower jaw forward and slightly open your bite. This opens your airway.  Devices that create positive airway pressure. This positive pressure "splints" your airway open to help you breathe better during sleep. The following devices create positive airway pressure:  Continuous positive airway pressure (CPAP) device. The CPAP device creates a continuous level of air pressure with an air pump. The air is delivered to your airway through a mask while you sleep. This continuous pressure keeps your airway open.  Nasal expiratory positive airway pressure (EPAP) device. The EPAP device creates positive air pressure as you exhale. The device consists of single-use valves, which are inserted into each nostril and held in place by adhesive. The valves create very little resistance when you inhale but create much more resistance when you exhale. That increased resistance creates the positive airway pressure. This positive pressure while you exhale keeps your airway open, making it easier to breath when you inhale again.  Bilevel positive airway pressure (BPAP) device. The BPAP device is used mainly in patients with central sleep apnea. This device is similar to the CPAP device because it also uses an air pump to deliver continuous air pressure   through a mask. However, with the BPAP machine, the pressure is set at two different levels. The pressure when you  exhale is lower than the pressure when you inhale.  Surgery. Typically, surgery is only done if you cannot comply with less invasive treatments or if the less invasive treatments do not improve your condition. Surgery involves removing excess tissue in your airway to create a wider passage way. Document Released: 10/18/2002 Document Revised: 02/22/2013 Document Reviewed: 03/05/2012 Exit[employer] Patient Information 2015 [employer]. This information is not intended to replace advice given to you by your health care provider. Make sure you discuss any questions you have with your health care provider.  

## 2014-07-15 ENCOUNTER — Telehealth: Payer: Self-pay | Admitting: Physician Assistant

## 2014-07-15 MED ORDER — METFORMIN HCL 1000 MG PO TABS: 1000.0000 mg | ORAL_TABLET | Freq: Two times a day (BID) | ORAL | Status: DC

## 2014-07-15 NOTE — Telephone Encounter (Signed)
Patient needs refill on his glumetza if possible  [facility] mail [facility] [phone]

## 2014-07-15 NOTE — Telephone Encounter (Signed)
Call placed to patient to confirm medication.   Prescription for Metformin sent to pharmacy.

## 2014-07-19 DIAGNOSIS — L97409 Non-pressure chronic ulcer of unspecified heel and midfoot with unspecified severity: Secondary | ICD-10-CM | POA: Diagnosis not present

## 2014-07-19 DIAGNOSIS — E1169 Type 2 diabetes mellitus with other specified complication: Secondary | ICD-10-CM | POA: Diagnosis not present

## 2014-07-26 DIAGNOSIS — L97409 Non-pressure chronic ulcer of unspecified heel and midfoot with unspecified severity: Secondary | ICD-10-CM | POA: Diagnosis not present

## 2014-07-26 DIAGNOSIS — E1169 Type 2 diabetes mellitus with other specified complication: Secondary | ICD-10-CM | POA: Diagnosis not present

## 2014-08-02 DIAGNOSIS — E1169 Type 2 diabetes mellitus with other specified complication: Secondary | ICD-10-CM | POA: Diagnosis not present

## 2014-08-02 DIAGNOSIS — L97409 Non-pressure chronic ulcer of unspecified heel and midfoot with unspecified severity: Secondary | ICD-10-CM | POA: Diagnosis not present

## 2014-08-06 DIAGNOSIS — R079 Chest pain, unspecified: Secondary | ICD-10-CM | POA: Diagnosis not present

## 2014-08-06 DIAGNOSIS — S20219A Contusion of unspecified front wall of thorax, initial encounter: Secondary | ICD-10-CM | POA: Diagnosis not present

## 2014-08-06 DIAGNOSIS — T1490XA Injury, unspecified, initial encounter: Secondary | ICD-10-CM | POA: Diagnosis not present

## 2014-08-06 DIAGNOSIS — S60229A Contusion of unspecified hand, initial encounter: Secondary | ICD-10-CM | POA: Diagnosis not present

## 2014-08-06 DIAGNOSIS — S0003XA Contusion of scalp, initial encounter: Secondary | ICD-10-CM | POA: Diagnosis not present

## 2014-08-06 DIAGNOSIS — S1093XA Contusion of unspecified part of neck, initial encounter: Secondary | ICD-10-CM | POA: Diagnosis not present

## 2014-08-09 ENCOUNTER — Other Ambulatory Visit (HOSPITAL_BASED_OUTPATIENT_CLINIC_OR_DEPARTMENT_OTHER): Payer: Self-pay | Admitting: General Surgery

## 2014-08-09 ENCOUNTER — Ambulatory Visit (HOSPITAL_COMMUNITY)
Admission: RE | Admit: 2014-08-09 | Discharge: 2014-08-09 | Disposition: A | Discharge status: Home / Self Care | Payer: Medicare Other | Source: Ambulatory Visit | Attending: General Surgery | Admitting: General Surgery

## 2014-08-09 DIAGNOSIS — S91309A Unspecified open wound, unspecified foot, initial encounter: Secondary | ICD-10-CM | POA: Diagnosis not present

## 2014-08-09 DIAGNOSIS — M869 Osteomyelitis, unspecified: Secondary | ICD-10-CM

## 2014-08-09 DIAGNOSIS — E119 Type 2 diabetes mellitus without complications: Secondary | ICD-10-CM | POA: Insufficient documentation

## 2014-08-09 DIAGNOSIS — E1169 Type 2 diabetes mellitus with other specified complication: Secondary | ICD-10-CM | POA: Diagnosis not present

## 2014-08-09 DIAGNOSIS — L97409 Non-pressure chronic ulcer of unspecified heel and midfoot with unspecified severity: Secondary | ICD-10-CM | POA: Diagnosis not present

## 2014-08-10 ENCOUNTER — Telehealth: Payer: Self-pay | Admitting: Physician Assistant

## 2014-08-10 DIAGNOSIS — H524 Presbyopia: Secondary | ICD-10-CM | POA: Diagnosis not present

## 2014-08-10 DIAGNOSIS — E11329 Type 2 diabetes mellitus with mild nonproliferative diabetic retinopathy without macular edema: Secondary | ICD-10-CM | POA: Diagnosis not present

## 2014-08-10 DIAGNOSIS — E1139 Type 2 diabetes mellitus with other diabetic ophthalmic complication: Secondary | ICD-10-CM | POA: Diagnosis not present

## 2014-08-10 MED ORDER — FUROSEMIDE 20 MG PO TABS: 20.0000 mg | ORAL_TABLET | Freq: Every day | ORAL | Status: DC

## 2014-08-10 MED ORDER — ATORVASTATIN CALCIUM 80 MG PO TABS: 80.0000 mg | ORAL_TABLET | Freq: Every day | ORAL | Status: DC

## 2014-08-10 MED ORDER — CHOLINE FENOFIBRATE 135 MG PO CPDR: 135.0000 mg | DELAYED_RELEASE_CAPSULE | Freq: Every day | ORAL | Status: DC

## 2014-08-10 NOTE — Telephone Encounter (Signed)
Medication refilled per protocol. 

## 2014-08-10 NOTE — Telephone Encounter (Signed)
Patient called to get refills on his furosemide, atorvastatin, and trilipix these will be [facility] [place], [place] [phone] (Phone) [phone] (Fax

## 2014-08-11 DIAGNOSIS — R2232 Localized swelling, mass and lump, left upper limb: Secondary | ICD-10-CM | POA: Diagnosis not present

## 2014-08-11 DIAGNOSIS — S62342A Nondisplaced fracture of base of third metacarpal bone, right hand, initial encounter for closed fracture: Secondary | ICD-10-CM | POA: Diagnosis not present

## 2014-08-11 DIAGNOSIS — M79642 Pain in left hand: Secondary | ICD-10-CM | POA: Diagnosis not present

## 2014-08-16 ENCOUNTER — Encounter (HOSPITAL_BASED_OUTPATIENT_CLINIC_OR_DEPARTMENT_OTHER): Payer: Medicare Other | Attending: General Surgery

## 2014-08-16 DIAGNOSIS — E11621 Type 2 diabetes mellitus with foot ulcer: Secondary | ICD-10-CM | POA: Diagnosis not present

## 2014-08-16 DIAGNOSIS — L97419 Non-pressure chronic ulcer of right heel and midfoot with unspecified severity: Secondary | ICD-10-CM | POA: Diagnosis not present

## 2014-08-16 DIAGNOSIS — L97412 Non-pressure chronic ulcer of right heel and midfoot with fat layer exposed: Secondary | ICD-10-CM | POA: Diagnosis not present

## 2014-08-23 DIAGNOSIS — E11621 Type 2 diabetes mellitus with foot ulcer: Secondary | ICD-10-CM | POA: Diagnosis not present

## 2014-08-23 DIAGNOSIS — L97419 Non-pressure chronic ulcer of right heel and midfoot with unspecified severity: Secondary | ICD-10-CM | POA: Diagnosis not present

## 2014-08-25 DIAGNOSIS — M79642 Pain in left hand: Secondary | ICD-10-CM | POA: Diagnosis not present

## 2014-08-25 DIAGNOSIS — R2232 Localized swelling, mass and lump, left upper limb: Secondary | ICD-10-CM | POA: Diagnosis not present

## 2014-08-25 DIAGNOSIS — S62347S Nondisplaced fracture of base of fifth metacarpal bone. left hand, sequela: Secondary | ICD-10-CM | POA: Diagnosis not present

## 2014-08-30 ENCOUNTER — Other Ambulatory Visit: Payer: Self-pay | Admitting: Family Medicine

## 2014-08-30 ENCOUNTER — Telehealth: Payer: Self-pay | Admitting: Physician Assistant

## 2014-08-30 DIAGNOSIS — L97419 Non-pressure chronic ulcer of right heel and midfoot with unspecified severity: Secondary | ICD-10-CM | POA: Diagnosis not present

## 2014-08-30 DIAGNOSIS — E11621 Type 2 diabetes mellitus with foot ulcer: Secondary | ICD-10-CM | POA: Diagnosis not present

## 2014-08-30 MED ORDER — METOPROLOL TARTRATE 50 MG PO TABS: 50.0000 mg | ORAL_TABLET | Freq: Two times a day (BID) | ORAL | Status: DC

## 2014-08-30 NOTE — Telephone Encounter (Signed)
Medication refilled per protocol. 

## 2014-08-30 NOTE — Telephone Encounter (Signed)
[phone]  [facility]  Pt is needing a refill on metoprolol (LOPRESSOR) 50 MG tablet

## 2014-09-06 DIAGNOSIS — E11621 Type 2 diabetes mellitus with foot ulcer: Secondary | ICD-10-CM | POA: Diagnosis not present

## 2014-09-06 DIAGNOSIS — L97419 Non-pressure chronic ulcer of right heel and midfoot with unspecified severity: Secondary | ICD-10-CM | POA: Diagnosis not present

## 2014-09-07 ENCOUNTER — Telehealth: Payer: Self-pay | Admitting: Physician Assistant

## 2014-09-07 MED ORDER — INSULIN LISPRO 100 UNIT/ML (KWIKPEN): 5.0000 [IU] | PEN_INJECTOR | Freq: Three times a day (TID) | SUBCUTANEOUS | Status: DC

## 2014-09-07 NOTE — Telephone Encounter (Signed)
Patient calling to get humalog 100 unit called in to [facility] if possible  [phone] with questions

## 2014-09-07 NOTE — Telephone Encounter (Signed)
Medication refilled per protocol. 

## 2014-09-08 ENCOUNTER — Telehealth: Payer: Self-pay | Admitting: Family Medicine

## 2014-09-08 DIAGNOSIS — M79642 Pain in left hand: Secondary | ICD-10-CM | POA: Diagnosis not present

## 2014-09-08 DIAGNOSIS — S62347S Nondisplaced fracture of base of fifth metacarpal bone. left hand, sequela: Secondary | ICD-10-CM | POA: Diagnosis not present

## 2014-09-08 DIAGNOSIS — M25632 Stiffness of left wrist, not elsewhere classified: Secondary | ICD-10-CM | POA: Diagnosis not present

## 2014-09-08 MED ORDER — INSULIN LISPRO 100 UNIT/ML (KWIKPEN): 5.0000 [IU] | PEN_INJECTOR | Freq: Three times a day (TID) | SUBCUTANEOUS | Status: DC

## 2014-09-08 NOTE — Telephone Encounter (Signed)
Medication refilled per protocol. 

## 2014-09-12 ENCOUNTER — Encounter (HOSPITAL_BASED_OUTPATIENT_CLINIC_OR_DEPARTMENT_OTHER): Payer: Medicare Other | Attending: General Surgery

## 2014-09-12 DIAGNOSIS — L97419 Non-pressure chronic ulcer of right heel and midfoot with unspecified severity: Secondary | ICD-10-CM | POA: Diagnosis not present

## 2014-09-16 ENCOUNTER — Other Ambulatory Visit (HOSPITAL_COMMUNITY): Payer: Self-pay | Admitting: *Deleted

## 2014-09-16 DIAGNOSIS — I739 Peripheral vascular disease, unspecified: Secondary | ICD-10-CM

## 2014-09-26 ENCOUNTER — Ambulatory Visit (INDEPENDENT_AMBULATORY_CARE_PROVIDER_SITE_OTHER): Payer: Medicare Other | Admitting: Physician Assistant

## 2014-09-26 ENCOUNTER — Encounter: Payer: Self-pay | Admitting: Physician Assistant

## 2014-09-26 VITALS — BP 122/64 | HR 68 | Temp 98.6°F | Resp 18 | Wt 249.0 lb

## 2014-09-26 DIAGNOSIS — L97419 Non-pressure chronic ulcer of right heel and midfoot with unspecified severity: Secondary | ICD-10-CM | POA: Diagnosis not present

## 2014-09-26 DIAGNOSIS — E1136 Type 2 diabetes mellitus with diabetic cataract: Secondary | ICD-10-CM

## 2014-09-26 DIAGNOSIS — I350 Nonrheumatic aortic (valve) stenosis: Secondary | ICD-10-CM | POA: Diagnosis not present

## 2014-09-26 DIAGNOSIS — E119 Type 2 diabetes mellitus without complications: Secondary | ICD-10-CM | POA: Diagnosis not present

## 2014-09-26 DIAGNOSIS — N189 Chronic kidney disease, unspecified: Secondary | ICD-10-CM

## 2014-09-26 DIAGNOSIS — I739 Peripheral vascular disease, unspecified: Secondary | ICD-10-CM

## 2014-09-26 DIAGNOSIS — E1165 Type 2 diabetes mellitus with hyperglycemia: Secondary | ICD-10-CM

## 2014-09-26 DIAGNOSIS — E038 Other specified hypothyroidism: Secondary | ICD-10-CM

## 2014-09-26 DIAGNOSIS — I2510 Atherosclerotic heart disease of native coronary artery without angina pectoris: Secondary | ICD-10-CM | POA: Diagnosis not present

## 2014-09-26 DIAGNOSIS — Z125 Encounter for screening for malignant neoplasm of prostate: Secondary | ICD-10-CM

## 2014-09-26 DIAGNOSIS — E08331 Diabetes mellitus due to underlying condition with moderate nonproliferative diabetic retinopathy with macular edema: Secondary | ICD-10-CM | POA: Diagnosis not present

## 2014-09-26 DIAGNOSIS — G4730 Sleep apnea, unspecified: Secondary | ICD-10-CM

## 2014-09-26 DIAGNOSIS — I10 Essential (primary) hypertension: Secondary | ICD-10-CM | POA: Diagnosis not present

## 2014-09-26 DIAGNOSIS — G4733 Obstructive sleep apnea (adult) (pediatric): Secondary | ICD-10-CM

## 2014-09-26 DIAGNOSIS — E669 Obesity, unspecified: Secondary | ICD-10-CM

## 2014-09-26 DIAGNOSIS — E785 Hyperlipidemia, unspecified: Secondary | ICD-10-CM | POA: Diagnosis not present

## 2014-09-26 DIAGNOSIS — E083319 Diabetes mellitus due to underlying condition with moderate nonproliferative diabetic retinopathy with macular edema, unspecified eye: Secondary | ICD-10-CM

## 2014-09-26 DIAGNOSIS — E039 Hypothyroidism, unspecified: Secondary | ICD-10-CM

## 2014-09-26 DIAGNOSIS — I4891 Unspecified atrial fibrillation: Secondary | ICD-10-CM

## 2014-09-26 LAB — COMPLETE METABOLIC PANEL WITH GFR
ALT: 27 U/L (ref 0–53)
AST: 25 U/L (ref 0–37)
Albumin: 4.1 g/dL (ref 3.5–5.2)
Alkaline Phosphatase: 29 U/L — ABNORMAL LOW (ref 39–117)
BUN: 18 mg/dL (ref 6–23)
CO2: 20 mEq/L (ref 19–32)
Calcium: 9.5 mg/dL (ref 8.4–10.5)
Chloride: 105 mEq/L (ref 96–112)
Creat: 1.39 mg/dL — ABNORMAL HIGH (ref 0.50–1.35)
GFR, Est African American: 59 mL/min — ABNORMAL LOW
GFR, Est Non African American: 51 mL/min — ABNORMAL LOW
Glucose, Bld: 176 mg/dL — ABNORMAL HIGH (ref 70–99)
Potassium: 5.2 mEq/L (ref 3.5–5.3)
Sodium: 137 mEq/L (ref 135–145)
Total Bilirubin: 0.4 mg/dL (ref 0.2–1.2)
Total Protein: 7 g/dL (ref 6.0–8.3)

## 2014-09-26 LAB — MICROALBUMIN, URINE: Microalb, Ur: 6.5 mg/dL — ABNORMAL HIGH (ref <2.0)

## 2014-09-26 LAB — T4, FREE: Free T4: 1.07 ng/dL (ref 0.80–1.80)

## 2014-09-26 LAB — LIPID PANEL
Cholesterol: 151 mg/dL (ref 0–200)
HDL: 30 mg/dL — ABNORMAL LOW (ref >39)
LDL Cholesterol: 91 mg/dL (ref 0–99)
Total CHOL/HDL Ratio: 5 Ratio
Triglycerides: 149 mg/dL (ref <150)
VLDL: 30 mg/dL (ref 0–40)

## 2014-09-26 LAB — TSH: TSH: 4.53 u[IU]/mL — ABNORMAL HIGH (ref 0.350–4.500)

## 2014-09-26 LAB — HEMOGLOBIN A1C
Hgb A1c MFr Bld: 8.1 % — ABNORMAL HIGH (ref <5.7)
Mean Plasma Glucose: 186 mg/dL — ABNORMAL HIGH (ref <117)

## 2014-09-26 NOTE — Progress Notes (Signed)
Patient ID: Steven Maldonado MRN: [id number], DOB: [birth year], 73 y.o. Date of Encounter: @DATE @  Chief Complaint:  Chief Complaint  Patient presents with  . 3 mth check up    is fasting    HPI: 73 y.o. year old white male  presents for routine follow up office visit.  He is quite a talker and continues to talk throughout the whole visit. He is retired from Architect work.  At [place] with me 01/10/14 he admited that for the past couple months he had not been eating right.   Michela Pitcher that his brother died in 09/13/2013. Has had a lot of family deaths in the past 4 years. His wife has had multiple surgeries in the past year. Therefore he has had to spend quite a bit of time and energy helping care for her but also has been stressed/fatigue secondary to watching her in pain after one of the surgeries.  At visit 01/10/14 A1c came back at 11.8.  Prior to that he was on Lantus 55 units. At that time I stated to increase to 60 units. Today he says that he is still doing the 60 units at night in the 5 units 3 times a day at mealtime. He is still giving mealtime insulin 5 units 3 times a day.  At every visit---he does not bring a blood sugar log sheet  or his glucose meter or anything with the numbers documented. Again, this is the case today.  Says  that he checks his sugars every morning and every day before eating supper. He says that he can recall the recent readings. He says that his machine shows him a 30 day average and it has been 101. He says that morning readings have been good and that they ones before dinner arenalways a little bit higher but still have been good.  He says that his diet has been some better since his office visit in March. Says "it's much better--but not going to say it is perfect."  In the past, he had been walking for exercise but has not been able to do that recently----At [facility] it was because he has lower extremity claudication. He had lower extremity angiogram and  intervention 05/2014. However, today he says that he still is not doing any walking because he has been told not to--- he is seeing the wound center once every week secondary to a wound on his right heel.  Taking all other medications as directed with no adverse effects. No other complaints today. Discussed whether he feels that he is depressed and needs medication for this but he says that he does not.  His wife has finished her surgeries and is improving. Once the weather improves he can get back outside which will help. As well, he named off multiple happy/positive events that are planned for the upcoming year.-- graduations, weddings,  and a trip.    Past Medical History  Diagnosis Date  . Atrial fibrillation     patient is on pradaxa  . Other and unspecified hyperlipidemia   . Unspecified essential hypertension   . Coronary artery disease     Cath February 2012 in [facility], occluded RCA with collaterals  . Aortic stenosis     Moderate by echo 8/12  . High cholesterol   . Heart disease   . CAD (coronary artery disease)   . Chronic kidney disease 08/30/2013  . 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 Heart murmur   . DM type 2 (diabetes  mellitus, type 2)   . Cataract associated with type 2 diabetes mellitus 10/06/2013  . Obstructive sleep apnea     2012  . Sleep apnea with use of continuous positive airway pressure (CPAP)     04-11-11 AHI was 32.9 and titrated to 15 cm H20, DME is [facility]  . Skin cancer     "cut off back of neck X 2; off left upper arm; right wrist, near right shoulder blade; several burned off other areas of my body" (06/08/2014)  . NSTEMI (non-ST elevated myocardial infarction) 12/2010  . Diabetic ulcer of heel 07/14/2014     Home Meds:  Outpatient Prescriptions Prior to Visit  Medication Sig Dispense Refill  . aspirin 81 MG tablet Take 81 mg by mouth daily.    [name] Kitchen atorvastatin (LIPITOR) 80 MG tablet Take 1 tablet (80 mg total) by mouth at bedtime. 90 tablet 0    . Blood Glucose Monitoring Suppl (FREESTYLE LITE) DEVI 1 each by Does not apply route 2 (two) times daily. 1 each 0  . Choline Fenofibrate (TRILIPIX) 135 MG capsule Take 1 capsule (135 mg total) by mouth daily. 90 capsule 0  . docusate sodium (COLACE) 100 MG capsule Take 100 mg by mouth daily as needed for mild constipation.    . furosemide (LASIX) 20 MG tablet Take 1 tablet (20 mg total) by mouth daily. 90 tablet 0  . glucose blood (FREESTYLE LITE) test strip 1 each by Other route 4 (four) times daily -  before meals and at bedtime. 150 each 11  . insulin glargine (LANTUS) 100 UNIT/ML injection Inject 0.6 mLs (60 Units total) into the skin daily. 60 mL 3  . insulin lispro (HUMALOG) 100 UNIT/ML KiwkPen Inject 0.05 mLs (5 Units total) into the skin 3 (three) times daily. 15 mL 3  . Insulin Pen Needle 32G X 4 MM MISC 1 each by Does not apply route 3 (three) times daily before meals. 100 each 11  . Insulin Syringe-Needle U-100 (BD INSULIN SYRINGE ULTRAFINE) 31G X 5/16" 1 ML MISC 1 Syringe by Does not apply route daily. 100 each 3  . lisinopril (PRINIVIL,ZESTRIL) 10 MG tablet Take 1 tablet (10 mg total) by mouth daily. 90 tablet 3  . metFORMIN (GLUCOPHAGE) 1000 MG tablet Take 1 tablet (1,000 mg total) by mouth 2 (two) times daily with a meal. 90 tablet 3  . metoprolol (LOPRESSOR) 50 MG tablet Take 1 tablet (50 mg total) by mouth 2 (two) times daily. 180 tablet 0  . PRADAXA 150 MG CAPS capsule TAKE 1 CAPSULE EVERY 12    HOURS 180 capsule 3  . collagenase (SANTYL) ointment Apply 1 application topically daily.     No facility-administered medications prior to visit.     Allergies: No Known Allergies  History   Social History  . Marital Status: Married    Spouse Name: [name]    Number of Children: 1  . Years of Education: 12   Occupational History  . retired    Social History Main Topics  . Smoking status: Former Smoker -- 2.00 packs/day for 14 years    Types: Cigarettes    Quit date:  11/11/1972  . Smokeless tobacco: Never Used  . Alcohol Use: No     Comment: quit in 1984  . Drug Use: No  . Sexual Activity: Not Currently   Other Topics Concern  . 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   Patient has one child and his wife has one child.   Patient is retired.   Patient has a high school education.   Patient is right-handed.   Patient drinks very little caffeine.    Family History  Problem Relation Age of Onset  . Diabetes Mother   . Heart attack Father   . Heart failure Father   . Diabetes Other   . Diabetes Sister   . Diabetes Brother   . Diabetes Daughter     TYPE ll  . Heart Problems Daughter      Review of Systems:  See HPI for pertinent ROS. All other ROS negative.    Physical Exam: Blood pressure 122/64, pulse 68, temperature 98.6 F (37 C), temperature source Oral, resp. rate 18, weight 249 lb (112.946 kg)., Body mass index is 33.76 kg/(m^2). General: Mild/moderate obese white male .Appears in no acute distress. Neck: Supple. No thyromegaly. No lymphadenopathy. No carotid bruits. Lungs: Clear bilaterally to auscultation without wheezes, rales, or rhonchi. Breathing is unlabored. Heart: Irreg III-IV/VI murmur heard throughout precordium. Abdomen: Soft, non-tender, non-distended with normoactive bowel sounds. No hepatomegaly. No rebound/guarding. No obvious abdominal masses. Musculoskeletal:  Strength and tone normal for age. Extremities/Skin: Warm and dry.. No edema.  Neuro: Alert and oriented X 3. Moves all extremities spontaneously. Gait is normal. CNII-XII grossly in tact. Psych:  Responds to questions appropriately with a normal affect. Diabetic foot exam: He is wearing special shoe on right foot--has f/u appt at wound center this afternoon.     ASSESSMENT AND PLAN:  73 y.o. year old male with  1. CAD Sees cardiology on a routine basis.  2. Aortic stenosis See #1  3.  A. Fib--per Cardiology  3. DM type 2 (diabetes mellitus, type 2)  HIS LAST A1C WAS 06/02/14--10.3---AT THAT RESULT NOTE AS STATED FOR HIM TO START WRITING DOWN BLOOD SUGARS AND BRING IN BLOOD SUGAR LOG SHEET TO VISIT WITH ME IN 2 WEEKS. hOWEVER HE NEVER HAD FOLLOW-UP WITH ME UNTIL NOW. aGAIN TODAY HE DOES NOT BRING IN ANY BLOOD SUGAR LOG SHEET. TODAY i HAVE GIVEN HIM A BLOOD SUGAR LOG SHEET AND INFORMED HIM HOW TO COMPLETE FILL IT OUT AND TO WRITE DOWN THE NUMBERS AND BRING IT INTO ME.  - Hemoglobin A1c  Micro albumin done 08/30/13 normal--Repeat MicroAlbumin 09/26/2014  He is currently on Lantus 60 units.  Also uses Humalog 5 units 3 times a day with meals.  He is now on ACE Inhibitor. I added this at 01/10/2014 OV.  He is on statin. He is on aspirin 81 mg daily In the past -- 01/2014--I documented: He has very good foot care. He actually states that he just gets pedicures occasionally. He is very careful with his  Feet. He is currrently seeing [facility] once a week.  Does have routine ophthalmic exam. He recently got a report from Ophthalmologist-  Which states--3 #4 and #5 and #6 below  4. Type II or unspecified type diabetes mellitus with ophthalmic manifestations, not stated as uncontrolled Per Ophthalmology 5. 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 Chronic kidney disease  8. HYPERTENSION, BENIGN Blood Pressure at goal. On ACE inhibitor.  9. HYPERLIPIDEMIA-MIXED On Lipitor 80. Had FLP/LFT 01/2014  10. Obstructive sleep apnea He wears CPAP. Managed by Dr. Jill Poling  11. Sleep apnea with use of continuous positive airway pressure (CPAP) See # 10  12. Obesity (BMI 30-39.9) See history  of present illness  13. Subclinical hypothyroidism - TSH, Free T4 last checked 01/2014--cont to monitor with  Labs today.  14. Screening PSA was done on 05/07/13--will recheck  today--09/26/14--After this, cna probably stop checking this given age 20.   15. Colonoscopy: He had colonoscopy 06/16/2009  16. Immunizations: Pneumonia vaccine:  Pneumovax 23--was given 01/01/2011   Prevnar 13 today..--Given here 06/02/2014 Tetanus vaccine: ---Given here 06/02/2014    Regular office visit 3 months or sooner if needed.  Signed, [address] [place], [place], [facility] 09/26/2014 8:50 AM

## 2014-09-27 DIAGNOSIS — M25632 Stiffness of left wrist, not elsewhere classified: Secondary | ICD-10-CM | POA: Diagnosis not present

## 2014-09-27 LAB — PSA, MEDICARE: PSA: 2.18 ng/mL (ref <=4.00)

## 2014-09-27 NOTE — Progress Notes (Signed)
[facility]  NAME:  Steven, Maldonado                ACCOUNT NO.:  [id number]  MEDICAL RECORD NO.:  [id number]      DATE OF BIRTH:  03/19/1941  PHYSICIAN:  Irene Limbo, MD    VISIT DATE:  09/12/2014                                  OFFICE VISIT   CHIEF COMPLAINT:  Second opinion for chronic heel ulceration.  HISTORY OF PRESENT ILLNESS:  The patient is a 73 year old diabetic male who was referred from Dr. Yolanda Manges clinic for an another opinion regarding a chronic heel ulceration.  The patient has been in the [place] since July of 2015.  The patient's current wound care has been calcium alginate.  He has also had Prisma and previous Santyl use.  He Is using an offloading shoe.  No laboratories have been obtained since July of 2015, with the exception of wound culture, which had no growth, this is dated August 16, 2014.  He has plain film dated August 09, 2014, which shows soft tissue defect with no evidence of osteomyelitis. The patient has history of peripheral vascular disease. He has undergone a right-sided angioplasty by Dr. Fletcher Anon of the popliteal artery.  Following this, the patient demonstrated a right-sided ABI of 0.86 and right-sided TBI of 0.64.  He has a known left popliteal artery occlusion.  He is scheduled for followup with Dr. Fletcher Anon in December.  Review of his care in the [place] reveals as noted no recent laboratory.  He has not had any advanced wound care products such as Apligraf or PriMatrix.  He does not have any recent hemoglobin A1c or nutrition labs available for review. His last hemoglobin A1c was elevated at 10.3.  On examination, blood pressure is 119/42, blood glucose is reported at 92, pulse is 60, temperature is 97.7.  Examination reveals remaining open wound at 0.2 x 0.2 x 0.5 cm.  Endoform was moistened and placed in the wound today.  We will apply for advance wound care product, PriMatrix as he has failed local wound care  for several months.  We will obtain hemoglobin A1c and nutrition labs.  When teaching the patient with regards to wound care, it is apparent that they have not been placing any of the wound care products within the wound, but laying it on top of it.  This wound care instruction was then given to the patient and these will be completed by his wife.  Plan for followup in 2 week's time.          ______________________________ Irene Limbo, MD MBA     BT/MEDQ  D:  09/26/2014  T:  09/27/2014  Job:  [id number]

## 2014-09-28 ENCOUNTER — Encounter (HOSPITAL_COMMUNITY): Payer: Medicare Other

## 2014-10-04 ENCOUNTER — Encounter (HOSPITAL_COMMUNITY): Payer: Medicare Other

## 2014-10-04 ENCOUNTER — Ambulatory Visit: Payer: Medicare Other | Admitting: Cardiovascular Disease

## 2014-10-04 DIAGNOSIS — M2010 Hallux valgus (acquired), unspecified foot: Secondary | ICD-10-CM | POA: Diagnosis not present

## 2014-10-04 DIAGNOSIS — L602 Onychogryphosis: Secondary | ICD-10-CM | POA: Diagnosis not present

## 2014-10-04 DIAGNOSIS — E1151 Type 2 diabetes mellitus with diabetic peripheral angiopathy without gangrene: Secondary | ICD-10-CM | POA: Diagnosis not present

## 2014-10-05 ENCOUNTER — Telehealth: Payer: Self-pay | Admitting: Physician Assistant

## 2014-10-05 MED ORDER — METOPROLOL TARTRATE 50 MG PO TABS: 50.0000 mg | ORAL_TABLET | Freq: Two times a day (BID) | ORAL | Status: DC

## 2014-10-05 MED ORDER — FUROSEMIDE 20 MG PO TABS: 20.0000 mg | ORAL_TABLET | Freq: Every day | ORAL | Status: DC

## 2014-10-05 MED ORDER — DABIGATRAN ETEXILATE MESYLATE 150 MG PO CAPS: 150.0000 mg | ORAL_CAPSULE | Freq: Two times a day (BID) | ORAL | Status: DC

## 2014-10-05 MED ORDER — ATORVASTATIN CALCIUM 80 MG PO TABS: 80.0000 mg | ORAL_TABLET | Freq: Every day | ORAL | Status: DC

## 2014-10-05 MED ORDER — CHOLINE FENOFIBRATE 135 MG PO CPDR: 135.0000 mg | DELAYED_RELEASE_CAPSULE | Freq: Every day | ORAL | Status: DC

## 2014-10-05 NOTE — Telephone Encounter (Signed)
Medication refilled per protocol. 

## 2014-10-05 NOTE — Telephone Encounter (Signed)
[place]  Pt is needing a refill on  PRADAXA 150 MG CAPS capsule metoprolol (LOPRESSOR) 50 MG tablet furosemide (LASIX) 20 MG tablet Choline Fenofibrate (TRILIPIX) 135 MG capsule atorvastatin (LIPITOR) 80 MG tablet

## 2014-10-11 ENCOUNTER — Ambulatory Visit: Payer: Medicare Other | Admitting: Cardiovascular Disease

## 2014-10-13 ENCOUNTER — Ambulatory Visit (HOSPITAL_COMMUNITY): Payer: Medicare Other | Attending: Cardiovascular Disease | Admitting: Cardiology

## 2014-10-13 DIAGNOSIS — I10 Essential (primary) hypertension: Secondary | ICD-10-CM | POA: Insufficient documentation

## 2014-10-13 DIAGNOSIS — I2510 Atherosclerotic heart disease of native coronary artery without angina pectoris: Secondary | ICD-10-CM | POA: Diagnosis not present

## 2014-10-13 DIAGNOSIS — I739 Peripheral vascular disease, unspecified: Secondary | ICD-10-CM | POA: Diagnosis not present

## 2014-10-13 DIAGNOSIS — E119 Type 2 diabetes mellitus without complications: Secondary | ICD-10-CM | POA: Diagnosis not present

## 2014-10-13 DIAGNOSIS — E785 Hyperlipidemia, unspecified: Secondary | ICD-10-CM | POA: Diagnosis not present

## 2014-10-13 DIAGNOSIS — Z87891 Personal history of nicotine dependence: Secondary | ICD-10-CM | POA: Diagnosis not present

## 2014-10-13 NOTE — Progress Notes (Signed)
Rt LEA duplex + ABI performed

## 2014-10-18 ENCOUNTER — Telehealth: Payer: Self-pay | Admitting: Physician Assistant

## 2014-10-18 DIAGNOSIS — Z794 Long term (current) use of insulin: Principal | ICD-10-CM

## 2014-10-18 DIAGNOSIS — E118 Type 2 diabetes mellitus with unspecified complications: Principal | ICD-10-CM

## 2014-10-18 DIAGNOSIS — IMO0001 Reserved for inherently not codable concepts without codable children: Secondary | ICD-10-CM

## 2014-10-18 NOTE — Telephone Encounter (Signed)
Patient is calling to ask [facility] [address] [facility] for freestyle light test strips if possible if questions please call him at [phone]

## 2014-10-19 MED ORDER — GLUCOSE BLOOD VI STRP: 1.0000 | ORAL_STRIP | Freq: Three times a day (TID) | Status: DC

## 2014-10-19 NOTE — Telephone Encounter (Signed)
Test strips refilled

## 2014-10-20 ENCOUNTER — Encounter (HOSPITAL_COMMUNITY): Payer: Self-pay | Admitting: Cardiovascular Disease

## 2014-10-26 DIAGNOSIS — H6123 Impacted cerumen, bilateral: Secondary | ICD-10-CM | POA: Diagnosis not present

## 2014-10-27 ENCOUNTER — Ambulatory Visit (INDEPENDENT_AMBULATORY_CARE_PROVIDER_SITE_OTHER): Payer: Medicare Other | Admitting: Cardiovascular Disease

## 2014-10-27 ENCOUNTER — Encounter: Payer: Self-pay | Admitting: Cardiovascular Disease

## 2014-10-27 VITALS — BP 110/72 | HR 58 | Ht 72.0 in | Wt 248.8 lb

## 2014-10-27 DIAGNOSIS — I2510 Atherosclerotic heart disease of native coronary artery without angina pectoris: Secondary | ICD-10-CM | POA: Diagnosis not present

## 2014-10-27 DIAGNOSIS — E785 Hyperlipidemia, unspecified: Secondary | ICD-10-CM | POA: Diagnosis not present

## 2014-10-27 DIAGNOSIS — I480 Paroxysmal atrial fibrillation: Secondary | ICD-10-CM

## 2014-10-27 DIAGNOSIS — I350 Nonrheumatic aortic (valve) stenosis: Secondary | ICD-10-CM | POA: Diagnosis not present

## 2014-10-27 DIAGNOSIS — I10 Essential (primary) hypertension: Secondary | ICD-10-CM

## 2014-10-27 DIAGNOSIS — I739 Peripheral vascular disease, unspecified: Secondary | ICD-10-CM | POA: Diagnosis not present

## 2014-10-27 NOTE — Progress Notes (Signed)
History of Present Illness: 73 yo WM with h/o DM, HTN, hyperlipidemia and CAD here for cardiac follow up. I saw him as a new patient 01/11/11. Prior to his first appt, he woke up while in [place] and felt weak and sweaty. He was found to have atrial fibrillation with RVR. He ruled in for an MI with cardiac enzymes. Cardiac cath with occluded RCA with collaterals. He was hospitalized on 12/27/10 for four days at [facility]. He was started on Pradaxa for anticoagulation. DCCV on 08/02/11 with conversion to sinus rhythm. Echo December 2014 with LVEF=55-60%, moderate aortic stenosis, normal wall motion reported. His PAD is followed by Dr. Fletcher Anon. Known slow healing right heal wound and right calf claudication. 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    He is here today for follow up. He is still in sinus rhythm. Rare palpitations. No chest pain or SOB. He has been walking a mile three days per week. He feels great.   Primary Care Physician: Dena Billet  Last Lipid Profile: Followed in primary care.   Past Medical History  Diagnosis Date  . Atrial fibrillation     patient is on pradaxa  . Other and unspecified hyperlipidemia   . Unspecified essential hypertension   . Coronary artery disease     Cath February 2012 in [place] [facility], occluded RCA with collaterals  . Aortic stenosis     Moderate by echo 8/12  . High cholesterol   . Heart disease   . CAD (coronary artery disease)   . Chronic kidney disease 08/30/2013  . 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 Heart murmur   . DM type 2 (diabetes mellitus, type 2)   . Cataract associated with type 2 diabetes mellitus 10/06/2013  . Obstructive sleep apnea     2012  . Sleep apnea with use of continuous positive airway pressure (CPAP)     04-11-11 AHI was 32.9 and titrated to 15 cm H20, DME is [facility]  . Skin cancer     "cut off back of neck X 2; off left upper arm; right wrist, near right  shoulder blade; several burned off other areas of my body" (06/08/2014)  . NSTEMI (non-ST elevated myocardial infarction) 12/2010  . Diabetic ulcer of heel 07/14/2014    Past Surgical History  Procedure Laterality Date  . Appendectomy  1965  . Cardioversion  07/2011  . Skin cancer excision Bilateral     "have had them cut off back of neck X 2; off left upper arm; right wrist, near right shoulder blade" (06/08/2014)  . Cardioversion N/A 04/18/2014    Procedure: CARDIOVERSION;  Surgeon: Dorothy Spark, MD;  Location: [place];  Service: Cardiovascular;  Laterality: N/A;  . Popliteal artery angioplasty Right 06/08/2014    Archie Endo 06/08/2014  . Cardiac catheterization  12/2010  . Abdominal angiogram N/A 06/08/2014    Procedure: ABDOMINAL ANGIOGRAM;  Surgeon: Wellington Hampshire, MD;  Location: [facility] CATH LAB;  Service: Cardiovascular;  Laterality: N/A;  . Lower extremity angiogram N/A 06/08/2014    Procedure: LOWER EXTREMITY ANGIOGRAM;  Surgeon: Wellington Hampshire, MD;  Location: [place] CATH LAB;  Service: Cardiovascular;  Laterality: N/A;    Current Outpatient Prescriptions  Medication Sig Dispense Refill  . aspirin 81 MG tablet Take 81 mg by mouth daily.    [name] Kitchen atorvastatin (LIPITOR) 80 MG tablet Take 1 tablet (80 mg total) by mouth at bedtime. 90 tablet 1  . Blood Glucose  Monitoring Suppl (FREESTYLE LITE) DEVI 1 each by Does not apply route 2 (two) times daily. 1 each 0  . Choline Fenofibrate (TRILIPIX) 135 MG capsule Take 1 capsule (135 mg total) by mouth daily. 90 capsule 1  . dabigatran (PRADAXA) 150 MG CAPS capsule Take 1 capsule (150 mg total) by mouth every 12 (twelve) hours. 180 capsule 1  . docusate sodium (COLACE) 100 MG capsule Take 100 mg by mouth daily as needed for mild constipation.    . furosemide (LASIX) 20 MG tablet Take 1 tablet (20 mg total) by mouth daily. 90 tablet 1  . glucose blood (FREESTYLE LITE) test strip 1 each by Other route 4 (four) times daily -  before meals and at bedtime.  150 each 11  . insulin glargine (LANTUS) 100 UNIT/ML injection Inject 0.6 mLs (60 Units total) into the skin daily. 60 mL 3  . insulin lispro (HUMALOG) 100 UNIT/ML KiwkPen Inject 0.05 mLs (5 Units total) into the skin 3 (three) times daily. 15 mL 3  . Insulin Pen Needle 32G X 4 MM MISC 1 each by Does not apply route 3 (three) times daily before meals. 100 each 11  . Insulin Syringe-Needle U-100 (BD INSULIN SYRINGE ULTRAFINE) 31G X 5/16" 1 ML MISC 1 Syringe by Does not apply route daily. 100 each 3  . lisinopril (PRINIVIL,ZESTRIL) 10 MG tablet Take 1 tablet (10 mg total) by mouth daily. 90 tablet 3  . metFORMIN (GLUCOPHAGE) 1000 MG tablet Take 1 tablet (1,000 mg total) by mouth 2 (two) times daily with a meal. 90 tablet 3  . metoprolol (LOPRESSOR) 50 MG tablet Take 1 tablet (50 mg total) by mouth 2 (two) times daily. 180 tablet 1   No current facility-administered medications for this visit.    No Known Allergies  History   Social History  . Marital Status: Married    Spouse Name: [name]    Number of Children: 1  . Years of Education: 12   Occupational History  . retired    Social History Main Topics  . Smoking status: Former Smoker -- 2.00 packs/day for 14 years    Types: Cigarettes    Quit date: 11/11/1972  . Smokeless tobacco: Never Used  . Alcohol Use: No     Comment: quit in 1984  . Drug Use: No  . Sexual Activity: Not Currently   Other Topics Concern  . 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   Patient has one child and his wife has one child.   Patient is retired.   Patient has a high school education.   Patient is right-handed.   Patient drinks very little caffeine.    Family History  Problem Relation Age of Onset  . Diabetes Mother   . Heart attack Father   . Heart failure Father   . Diabetes Other   . Diabetes Sister   . Diabetes Brother   . Diabetes Daughter     TYPE ll  . Heart Problems  Daughter     Review of Systems:  As stated in the HPI and otherwise negative.   BP 110/72 mmHg  Pulse 58  Ht 6' (1.829 m)  Wt 248 lb 12.8 oz (112.855 kg)  BMI 33.74 kg/m2  SpO2 97%  Physical Examination: General: Well developed, well nourished, NAD HEENT: OP clear, mucus membranes moist SKIN: warm, dry. No rashes. Neuro: No focal deficits Musculoskeletal: Muscle strength  5/5 all ext Psychiatric: Mood and affect normal Neck: No JVD, no carotid bruits, no thyromegaly, no lymphadenopathy. Lungs:Clear bilaterally, no wheezes, rhonci, crackles Cardiovascular: Regular rate and rhythm. Systolic murmur, gallops or rubs. Abdomen:Soft. Bowel sounds present. Non-tender.  Extremities: No lower extremity edema. Pulses are 2 + in the bilateral DP/PT  Echo: December 2014: Left ventricle: The cavity size was normal. Wall thickness was increased in a pattern of mild LVH. There was mild focal basal hypertrophy of the septum. Systolic function was normal. The estimated ejection fraction was in the range of 55% to 60%. There is hypokinesis of the basal inferior posterior myocardium. Doppler parameters are consistent with abnormal left ventricular relaxation (grade 1 diastolic dysfunction). Doppler parameters are consistent with high ventricular filling pressure. - Aortic valve: Valve mobility was restricted. There was moderate stenosis. - Mitral valve: Moderately calcified annulus. Mild regurgitation. - Left atrium: The atrium was mildly dilated.  EKG: Sinus brady, rate 58 bpm. Old inferior infarct. T wave inversions inferior and lateral leads.   Assessment and Plan:   1. CAD: Stable. No changes. Continue current meds.   2. Aortic stenosis: Moderate by echo December 2014. Repeat echo now.   3. ATRIAL FIBRILLATION: Maintaining NSR. Continue beta blocker. Continue Pradaxa for anti-coagulation.   4. HTN: BP controlled. Continue current therapy.   5. HLD: He is on a statin. Lipids  followed in primary care  6. PAD: followed by Dr. Fletcher Anon. Stable.

## 2014-10-27 NOTE — Patient Instructions (Signed)
Your physician wants you to follow-up in: 6 months.  You will receive a reminder letter in the mail two months in advance. If you don't receive a letter, please call our office to schedule the follow-up appointment.  Your physician has requested that you have an echocardiogram. Echocardiography is a painless test that uses sound waves to create images of your heart. It provides your doctor with information about the size and shape of your heart and how well your heart's chambers and valves are working. This procedure takes approximately one hour. There are no restrictions for this procedure.   Please schedule patient for follow up appointment with Dr. Fletcher Anon

## 2014-11-16 ENCOUNTER — Telehealth: Payer: Self-pay | Admitting: Physician Assistant

## 2014-11-16 MED ORDER — INSULIN LISPRO 100 UNIT/ML (KWIKPEN): 5.0000 [IU] | PEN_INJECTOR | Freq: Three times a day (TID) | SUBCUTANEOUS | Status: DC

## 2014-11-16 MED ORDER — "INSULIN SYRINGE-NEEDLE U-100 31G X 5/16"" 1 ML MISC": 1.0000 | Freq: Every day | Status: DC

## 2014-11-16 NOTE — Telephone Encounter (Signed)
Patient calling to get 1 year rx for his humalog medpens and bd insulin syringes if possible [facility]  [phone] with any questions

## 2014-11-29 ENCOUNTER — Encounter: Payer: Self-pay | Admitting: Cardiovascular Disease

## 2014-11-29 ENCOUNTER — Ambulatory Visit (INDEPENDENT_AMBULATORY_CARE_PROVIDER_SITE_OTHER): Payer: Medicare Other | Admitting: Cardiovascular Disease

## 2014-11-29 VITALS — BP 120/75 | HR 67 | Ht 72.0 in | Wt 250.0 lb

## 2014-11-29 DIAGNOSIS — I2510 Atherosclerotic heart disease of native coronary artery without angina pectoris: Secondary | ICD-10-CM | POA: Diagnosis not present

## 2014-11-29 DIAGNOSIS — I739 Peripheral vascular disease, unspecified: Secondary | ICD-10-CM

## 2014-11-29 NOTE — Progress Notes (Signed)
Primary Cardiologist: Dr. Angelena Form  HPI  This is a 74 year old male who is here today for a followup visit regarding  PAD with nonhealing ulcers. He has known hx of DM, HTN, hyperlipidemia, A-fib, moderate aortic stneosis and CAD.  He is s/p NSTEMI in the setting of atrial fibrillation with RVR in 12/2010.  Cardiac cath with occluded RCA with collaterals.Echo in December 2014 with LVEF=55-60%, moderate aortic stenosis, normal wall motion reported.   He was seen in 2015 for worsening severe  right calf claudication with skin breakdown on the right heal that was slow to heal. I proceeded with angiography on July 29 which showed:  1. No significant aortoiliac disease.  2. Chronically occluded left popliteal artery with 2 vessel runoff below the knee with reasonable collaterals.  3. Occluded right popliteal artery with poor collaterals. 2 vessel runoff below the knee with an occluded posterior tibial.   I performed successful angioplasty and drug-coated balloon angioplasty of the right popliteal artery. He had resolution of claudication since then and the ulcer healed completely.  He had recent duplex in December which showed an ABI of 0.88 on the right with evidence of restenosis in the popliteal artery with a peak velocity of 330.  No Known Allergies   Current Outpatient Prescriptions on File Prior to Visit  Medication Sig Dispense Refill  . aspirin 81 MG tablet Take 81 mg by mouth daily.    [name] Kitchen atorvastatin (LIPITOR) 80 MG tablet Take 1 tablet (80 mg total) by mouth at bedtime. 90 tablet 1  . Blood Glucose Monitoring Suppl (FREESTYLE LITE) DEVI 1 each by Does not apply route 2 (two) times daily. 1 each 0  . Choline Fenofibrate (TRILIPIX) 135 MG capsule Take 1 capsule (135 mg total) by mouth daily. 90 capsule 1  . dabigatran (PRADAXA) 150 MG CAPS capsule Take 1 capsule (150 mg total) by mouth every 12 (twelve) hours. 180 capsule 1  . docusate sodium (COLACE) 100 MG capsule Take 100 mg by mouth  daily as needed for mild constipation.    . furosemide (LASIX) 20 MG tablet Take 1 tablet (20 mg total) by mouth daily. 90 tablet 1  . glucose blood (FREESTYLE LITE) test strip 1 each by Other route 4 (four) times daily -  before meals and at bedtime. 150 each 11  . insulin glargine (LANTUS) 100 UNIT/ML injection Inject 0.6 mLs (60 Units total) into the skin daily. 60 mL 3  . insulin lispro (HUMALOG) 100 UNIT/ML KiwkPen Inject 0.05 mLs (5 Units total) into the skin 3 (three) times daily. 15 mL 3  . Insulin Pen Needle 32G X 4 MM MISC 1 each by Does not apply route 3 (three) times daily before meals. 100 each 11  . Insulin Syringe-Needle U-100 (BD INSULIN SYRINGE ULTRAFINE) 31G X 5/16" 1 ML MISC 1 Syringe by Does not apply route daily. 100 each 3  . lisinopril (PRINIVIL,ZESTRIL) 10 MG tablet Take 1 tablet (10 mg total) by mouth daily. 90 tablet 3  . metFORMIN (GLUCOPHAGE) 1000 MG tablet Take 1 tablet (1,000 mg total) by mouth 2 (two) times daily with a meal. 90 tablet 3  . metoprolol (LOPRESSOR) 50 MG tablet Take 1 tablet (50 mg total) by mouth 2 (two) times daily. 180 tablet 1   No current facility-administered medications on file prior to visit.     Past Medical History  Diagnosis Date  . Atrial fibrillation     patient is on pradaxa  . Other and unspecified  hyperlipidemia   . Unspecified essential hypertension   . Coronary artery disease     Cath February 2012 in [place] [facility], occluded RCA with collaterals  . Aortic stenosis     Moderate by echo 8/12  . High cholesterol   . Heart disease   . CAD (coronary artery disease)   . Chronic kidney disease 08/30/2013  . 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 Heart murmur   . DM type 2 (diabetes mellitus, type 2)   . Cataract associated with type 2 diabetes mellitus 10/06/2013  . Obstructive sleep apnea     2012  . Sleep apnea with use of continuous positive airway pressure (CPAP)     04-11-11 AHI was 32.9 and titrated to 15 cm H20, DME is  [facility]  . Skin cancer     "cut off back of neck X 2; off left upper arm; right wrist, near right shoulder blade; several burned off other areas of my body" (06/08/2014)  . NSTEMI (non-ST elevated myocardial infarction) 12/2010  . Diabetic ulcer of heel 07/14/2014     Past Surgical History  Procedure Laterality Date  . Appendectomy  1965  . Cardioversion  07/2011  . Skin cancer excision Bilateral     "have had them cut off back of neck X 2; off left upper arm; right wrist, near right shoulder blade" (06/08/2014)  . Cardioversion N/A 04/18/2014    Procedure: CARDIOVERSION;  Surgeon: Dorothy Spark, MD;  Location: [place];  Service: Cardiovascular;  Laterality: N/A;  . Popliteal artery angioplasty Right 06/08/2014    Archie Endo 06/08/2014  . Cardiac catheterization  12/2010  . Abdominal angiogram N/A 06/08/2014    Procedure: ABDOMINAL ANGIOGRAM;  Surgeon: Wellington Hampshire, MD;  Location: [facility] CATH LAB;  Service: Cardiovascular;  Laterality: N/A;  . Lower extremity angiogram N/A 06/08/2014    Procedure: LOWER EXTREMITY ANGIOGRAM;  Surgeon: Wellington Hampshire, MD;  Location: [place] CATH LAB;  Service: Cardiovascular;  Laterality: N/A;     Family History  Problem Relation Age of Onset  . Diabetes Mother   . Heart attack Father   . Heart failure Father   . Diabetes Other   . Diabetes Sister   . Diabetes Brother   . Diabetes Daughter     TYPE ll  . Heart Problems Daughter      History   Social History  . Marital Status: Married    Spouse Name: [name]    Number of Children: 1  . Years of Education: 12   Occupational History  . retired    Social History Main Topics  . Smoking status: Former Smoker -- 2.00 packs/day for 14 years    Types: Cigarettes    Quit date: 11/11/1972  . Smokeless tobacco: Never Used  . Alcohol Use: No     Comment: quit in 1984  . Drug Use: No  . Sexual Activity: Not Currently   Other Topics Concern  . 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   Patient has one child and his wife has one child.   Patient is retired.   Patient has a high school education.   Patient is right-handed.   Patient drinks very little caffeine.     ROS A 10 point review of system was performed. It is negative other than that mentioned in the history of present illness.   PHYSICAL EXAM   BP 118/60 mmHg  Pulse  67  Ht 6' (1.829 m)  Wt 250 lb (113.399 kg)  BMI 33.90 kg/m2  SpO2 97% Constitutional: He is oriented to person, place, and time. He appears well-developed and well-nourished. No distress.  HENT: No nasal discharge.  Head: Normocephalic and atraumatic.  Eyes: Pupils are equal and round.  No discharge. Neck: Normal range of motion. Neck supple. No JVD present. No thyromegaly present.  Cardiovascular: Normal rate, regular rhythm, normal heart sounds. Exam reveals no gallop and no friction rub. No murmur heard.  Pulmonary/Chest: Effort normal and breath sounds normal. No stridor. No respiratory distress. He has no wheezes. He has no rales. He exhibits no tenderness.  Abdominal: Soft. Bowel sounds are normal. He exhibits no distension. There is no tenderness. There is no rebound and no guarding.  Musculoskeletal: Normal range of motion. He exhibits no edema and no tenderness.  Neurological: He is alert and oriented to person, place, and time. Coordination normal.  Skin: Skin is warm and dry. No rash noted. He is not diaphoretic. No erythema. No pallor.  Psychiatric: He has a normal mood and affect. His behavior is normal. Judgment and thought content normal.  Vascular: radial pulse is very faint bilaterally , Femoral: right +2, left :+1, distal pulses are faint on the right not palpable on the left.      ASSESSMENT AND PLAN

## 2014-11-29 NOTE — Patient Instructions (Signed)
Your physician has requested that you have a lower extremity arterial duplex in 3 MONTHS. This test is an ultrasound of the arteries in the legs. It looks at arterial blood flow in the legs. Allow one hour for Lower Arterial scans. There are no restrictions or special instructions  Your physician wants you to follow-up in: 6 MONTHS with Dr Arida.  You will receive a reminder letter in the mail two months in advance. If you don't receive a letter, please call our office to schedule the follow-up appointment.  Your physician recommends that you continue on your current medications as directed. Please refer to the Current Medication list given to you today.  

## 2014-12-01 ENCOUNTER — Other Ambulatory Visit: Payer: Self-pay | Admitting: Radiology

## 2014-12-01 DIAGNOSIS — Z95820 Peripheral vascular angioplasty status with implants and grafts: Secondary | ICD-10-CM

## 2014-12-02 NOTE — Assessment & Plan Note (Signed)
He does have evidence of restenosis in the right popliteal artery. However, he is not having any claudication and also has healed completely. Thus, I recommend monitoring for now and repeat duplex in 3 months. That area was heavily calcified on angiography.

## 2014-12-02 NOTE — Assessment & Plan Note (Signed)
He reports no symptoms of angina.   

## 2014-12-05 DIAGNOSIS — L602 Onychogryphosis: Secondary | ICD-10-CM | POA: Diagnosis not present

## 2014-12-05 DIAGNOSIS — L84 Corns and callosities: Secondary | ICD-10-CM | POA: Diagnosis not present

## 2014-12-05 DIAGNOSIS — E1151 Type 2 diabetes mellitus with diabetic peripheral angiopathy without gangrene: Secondary | ICD-10-CM | POA: Diagnosis not present

## 2014-12-12 ENCOUNTER — Ambulatory Visit (HOSPITAL_COMMUNITY): Payer: Medicare Other | Attending: Cardiovascular Disease | Admitting: Cardiology

## 2014-12-12 DIAGNOSIS — E119 Type 2 diabetes mellitus without complications: Secondary | ICD-10-CM | POA: Insufficient documentation

## 2014-12-12 DIAGNOSIS — E785 Hyperlipidemia, unspecified: Secondary | ICD-10-CM | POA: Diagnosis not present

## 2014-12-12 DIAGNOSIS — I10 Essential (primary) hypertension: Secondary | ICD-10-CM | POA: Insufficient documentation

## 2014-12-12 DIAGNOSIS — I350 Nonrheumatic aortic (valve) stenosis: Secondary | ICD-10-CM | POA: Insufficient documentation

## 2014-12-12 NOTE — Progress Notes (Signed)
Echo performed. 

## 2014-12-14 DIAGNOSIS — X32XXXA Exposure to sunlight, initial encounter: Secondary | ICD-10-CM | POA: Diagnosis not present

## 2014-12-14 DIAGNOSIS — L570 Actinic keratosis: Secondary | ICD-10-CM | POA: Diagnosis not present

## 2014-12-14 DIAGNOSIS — Z85828 Personal history of other malignant neoplasm of skin: Secondary | ICD-10-CM | POA: Diagnosis not present

## 2014-12-14 DIAGNOSIS — D485 Neoplasm of uncertain behavior of skin: Secondary | ICD-10-CM | POA: Diagnosis not present

## 2014-12-14 DIAGNOSIS — L821 Other seborrheic keratosis: Secondary | ICD-10-CM | POA: Diagnosis not present

## 2014-12-15 DIAGNOSIS — L570 Actinic keratosis: Secondary | ICD-10-CM | POA: Diagnosis not present

## 2014-12-21 DIAGNOSIS — L570 Actinic keratosis: Secondary | ICD-10-CM | POA: Diagnosis not present

## 2014-12-28 ENCOUNTER — Encounter: Payer: Self-pay | Admitting: Family Medicine

## 2014-12-28 ENCOUNTER — Ambulatory Visit (INDEPENDENT_AMBULATORY_CARE_PROVIDER_SITE_OTHER): Payer: Medicare Other | Admitting: Physician Assistant

## 2014-12-28 ENCOUNTER — Encounter: Payer: Self-pay | Admitting: Physician Assistant

## 2014-12-28 VITALS — BP 106/56 | HR 60 | Temp 97.8°F | Resp 18 | Wt 247.0 lb

## 2014-12-28 DIAGNOSIS — I2510 Atherosclerotic heart disease of native coronary artery without angina pectoris: Secondary | ICD-10-CM | POA: Diagnosis not present

## 2014-12-28 DIAGNOSIS — E785 Hyperlipidemia, unspecified: Secondary | ICD-10-CM

## 2014-12-28 DIAGNOSIS — G4733 Obstructive sleep apnea (adult) (pediatric): Secondary | ICD-10-CM | POA: Diagnosis not present

## 2014-12-28 DIAGNOSIS — G4730 Sleep apnea, unspecified: Secondary | ICD-10-CM

## 2014-12-28 DIAGNOSIS — E1165 Type 2 diabetes mellitus with hyperglycemia: Secondary | ICD-10-CM | POA: Diagnosis not present

## 2014-12-28 DIAGNOSIS — I350 Nonrheumatic aortic (valve) stenosis: Secondary | ICD-10-CM | POA: Diagnosis not present

## 2014-12-28 DIAGNOSIS — I739 Peripheral vascular disease, unspecified: Secondary | ICD-10-CM

## 2014-12-28 DIAGNOSIS — N189 Chronic kidney disease, unspecified: Secondary | ICD-10-CM | POA: Diagnosis not present

## 2014-12-28 DIAGNOSIS — I10 Essential (primary) hypertension: Secondary | ICD-10-CM | POA: Diagnosis not present

## 2014-12-28 DIAGNOSIS — E08339 Diabetes mellitus due to underlying condition with moderate nonproliferative diabetic retinopathy without macular edema: Secondary | ICD-10-CM | POA: Diagnosis not present

## 2014-12-28 DIAGNOSIS — E669 Obesity, unspecified: Secondary | ICD-10-CM | POA: Diagnosis not present

## 2014-12-28 DIAGNOSIS — E038 Other specified hypothyroidism: Secondary | ICD-10-CM

## 2014-12-28 DIAGNOSIS — E1136 Type 2 diabetes mellitus with diabetic cataract: Secondary | ICD-10-CM | POA: Diagnosis not present

## 2014-12-28 DIAGNOSIS — I4891 Unspecified atrial fibrillation: Secondary | ICD-10-CM

## 2014-12-28 DIAGNOSIS — E039 Hypothyroidism, unspecified: Secondary | ICD-10-CM

## 2014-12-28 DIAGNOSIS — E119 Type 2 diabetes mellitus without complications: Secondary | ICD-10-CM | POA: Diagnosis not present

## 2014-12-28 DIAGNOSIS — N3941 Urge incontinence: Secondary | ICD-10-CM

## 2014-12-28 LAB — HEMOGLOBIN A1C, FINGERSTICK: Hgb A1C (fingerstick): 6.9 % — ABNORMAL HIGH (ref <5.7)

## 2014-12-28 LAB — HM DIABETES FOOT EXAM

## 2014-12-28 MED ORDER — FESOTERODINE FUMARATE ER 4 MG PO TB24: 4.0000 mg | ORAL_TABLET | Freq: Every day | ORAL | Status: DC

## 2014-12-28 NOTE — Progress Notes (Signed)
Patient ID: Steven Maldonado MRN: [id number], DOB: 1941/03/18, 74 y.o. Date of Encounter: @DATE @  Chief Complaint:  Chief Complaint  Patient presents with  . 3 mth check up/ med refills    is fasting  . form for diab shoes    HPI: 74 y.o. year old white male  presents for routine follow up office visit.  He is quite a talker and continues to talk throughout the whole visit. He is retired from Architect work.  At [place] with me 01/10/14 he admitted that for the past couple months he had not been eating right.   Michela Pitcher that his brother died in 05-Nov-2013. Has had a lot of family deaths in the past 4 years. His wife has had multiple surgeries in the past year. Therefore he has had to spend quite a bit of time and energy helping care for her but also has been stressed/fatigue secondary to watching her in pain after one of the surgeries.  At visit 01/10/14 A1c came back at 11.8.  Prior to that he was on Lantus 55 units. At that time I stated to increase to 60 units. Today he says that he is still doing the 60 units at night in the 5 units 3 times a day at mealtime. He is still giving mealtime insulin 5 units 3 times a day.  At every visit---he does not bring a blood sugar log sheet  or his glucose meter or anything with the numbers documented. Again, this is the case today.  Says  that he checks his sugars every morning and every day before eating supper. He says that he can recall the recent readings. He says that his machine shows him a 30 day average and it has been 101. He says that morning readings have been good and that they ones before dinner arenalways a little bit higher but still have been good.  He says that his diet has been some better since his office visit in March. Says "it's much better--but not going to say it is perfect."  In the past, he had been walking for exercise but has not been able to do that recently----At LOV it was because he has lower extremity claudication.  He had lower extremity angiogram and intervention 05/2014. However, today he says that he still is not doing any walking because he has been told not to--- he is seeing the wound center once every week secondary to a wound on his right heel.  Taking all other medications as directed with no adverse effects. No other complaints today. Discussed whether he feels that he is depressed and needs medication for this but he says that he does not.  His wife has finished her surgeries and is improving. Once the weather improves he can get back outside which will help. As well, he named off multiple happy/positive events that are planned for the upcoming year.-- graduations, weddings,  and a trip.  When we did labs at his office visit 09/26/14, all results came back good except A1c was elevated and I told him to start documenting blood sugars on a log sheet and bring into the next appointment. At office visit 12/28/14 he does bring in blood sugar log sheet. He has blood sugars documented for every single day --has fasting morning blood sugar readings and also blood sugar readings before dinner/supper. Fasting morning blood sugars are all in the 80s and 90s. Before dinner/supper--- all readings are in the 90s and low 100s to 110s.  He says that his " diet is better"  and his  "numbers are better".  At visit 12/28/14 he also reports that he saw podiatry in November and again in January. However reports that [facility] says that podiatry cannot be the ones to complete their order that he has to get that done here. He brings in that form today. He says that his prior sore on his heel of his foot has completely healed. His last checkup they just checked his feet and trimmed his toenails.  At visit 12/28/14 he also reports problems with urinary urgency.  Says that he will all of a sudden have the sensation that he needs to urinate. He will go straight to the restroom but feels that he can barely  get there in time. Has started wearing a "diaper" at night because otherwise sometimes wets himself.  Does not have much nocturia. Says some nights he doesn't wake up at all to use the restroom and other nights it's only 1 or 2 times.    Past Medical History  Diagnosis Date  . Atrial fibrillation     patient is on pradaxa  . Other and unspecified hyperlipidemia   . Unspecified essential hypertension   . Coronary artery disease     Cath February 2012 in [place] [facility], occluded RCA with collaterals  . Aortic stenosis     Moderate by echo 8/12  . High cholesterol   . Heart disease   . CAD (coronary artery disease)   . Chronic kidney disease 08/30/2013  . 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 Heart murmur   . DM type 2 (diabetes mellitus, type 2)   . Cataract associated with type 2 diabetes mellitus 10/06/2013  . Obstructive sleep apnea     2012  . Sleep apnea with use of continuous positive airway pressure (CPAP)     04-11-11 AHI was 32.9 and titrated to 15 cm H20, DME is [facility]  . Skin cancer     "cut off back of neck X 2; off left upper arm; right wrist, near right shoulder blade; several burned off other areas of my body" (06/08/2014)  . NSTEMI (non-ST elevated myocardial infarction) 12/2010  . Diabetic ulcer of heel 07/14/2014     Home Meds:  Outpatient Prescriptions Prior to Visit  Medication Sig Dispense Refill  . aspirin 81 MG tablet Take 81 mg by mouth daily.    [name] Kitchen atorvastatin (LIPITOR) 80 MG tablet Take 1 tablet (80 mg total) by mouth at bedtime. 90 tablet 1  . Choline Fenofibrate (TRILIPIX) 135 MG capsule Take 1 capsule (135 mg total) by mouth daily. 90 capsule 1  . dabigatran (PRADAXA) 150 MG CAPS capsule Take 1 capsule (150 mg total) by mouth every 12 (twelve) hours. 180 capsule 1  . docusate sodium (COLACE) 100 MG capsule Take 100 mg by mouth daily as needed for mild constipation.    [name] Kitchen glucose blood (FREESTYLE LITE) test strip 1 each by Other route 4 (four) times daily -   before meals and at bedtime. 150 each 11  . insulin glargine (LANTUS) 100 UNIT/ML injection Inject 0.6 mLs (60 Units total) into the skin daily. 60 mL 3  . insulin lispro (HUMALOG) 100 UNIT/ML KiwkPen Inject 0.05 mLs (5 Units total) into the skin 3 (three) times daily. 15 mL 3  . Insulin Pen Needle 32G X 4 MM MISC 1 each by Does not apply route 3 (three) times daily before meals. 100 each 11  . Insulin  Syringe-Needle U-100 (BD INSULIN SYRINGE ULTRAFINE) 31G X 5/16" 1 ML MISC 1 Syringe by Does not apply route daily. 100 each 3  . lisinopril (PRINIVIL,ZESTRIL) 10 MG tablet Take 1 tablet (10 mg total) by mouth daily. 90 tablet 3  . metFORMIN (GLUCOPHAGE) 1000 MG tablet Take 1 tablet (1,000 mg total) by mouth 2 (two) times daily with a meal. 90 tablet 3  . metoprolol (LOPRESSOR) 50 MG tablet Take 1 tablet (50 mg total) by mouth 2 (two) times daily. 180 tablet 1  . Blood Glucose Monitoring Suppl (FREESTYLE LITE) DEVI 1 each by Does not apply route 2 (two) times daily. 1 each 0  . furosemide (LASIX) 20 MG tablet Take 1 tablet (20 mg total) by mouth daily. 90 tablet 1   No facility-administered medications prior to visit.     Allergies: No Known Allergies  History   Social History  . Marital Status: Married    Spouse Name: [name]  . Number of Children: 1  . Years of Education: 12   Occupational History  . retired    Social History Main Topics  . Smoking status: Former Smoker -- 2.00 packs/day for 14 years    Types: Cigarettes    Quit date: 11/11/1972  . Smokeless tobacco: Never Used  . Alcohol Use: No     Comment: quit in 1984  . Drug Use: No  . Sexual Activity: Not Currently   Other Topics Concern  . 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   Patient has one child and his wife has one child.   Patient is retired.   Patient has a high school education.   Patient is right-handed.   Patient drinks very little  caffeine.    Family History  Problem Relation Age of Onset  . Diabetes Mother   . Heart attack Father   . Heart failure Father   . Diabetes Other   . Diabetes Sister   . Diabetes Brother   . Diabetes Daughter     TYPE ll  . Heart Problems Daughter      Review of Systems:  See HPI for pertinent ROS. All other ROS negative.    Physical Exam: Blood pressure 106/56, pulse 60, temperature 97.8 F (36.6 C), temperature source Oral, resp. rate 18, weight 247 lb (112.038 kg)., Body mass index is 33.49 kg/(m^2). General: Mild/moderate obese white male .Appears in no acute distress. Neck: Supple. No thyromegaly. No lymphadenopathy. Radiation of murmur vs carotid bruit--heard bilaterally. Lungs: Clear bilaterally to auscultation without wheezes, rales, or rhonchi. Breathing is unlabored. Heart: Irreg III-IV/VI murmur heard throughout precordium. Abdomen: Soft, non-tender, non-distended with normoactive bowel sounds. No hepatomegaly. No rebound/guarding. No obvious abdominal masses. Musculoskeletal:  Strength and tone normal for age. Extremities/Skin: Warm and dry.. No edema.  Neuro: Alert and oriented X 3. Moves all extremities spontaneously. Gait is normal. CNII-XII grossly in tact. Psych:  Responds to questions appropriately with a normal affect. Diabetic foot exam:  Inspection is normal.  No open wounds. No calluses.  He does have diffuse dry skin on the plantar surface of his feet bilaterally.  No palpable dorsalis pedis or posterior tibial pulse bilaterally.      ASSESSMENT AND PLAN:  74 y.o. year old male with  1. CAD Sees cardiology on a routine basis.  2. PAD (peripheral artery disease) Regarding his peripheral arterial disease--- the following is copied from his cardiovascular doctor's (Dr.  Arida) note 11/29/14. "He was seen in 2015 for worsening severe right calf claudication with skin breakdown on the right heel that was slow to heal. Dr. Fletcher Anon proceeded with angiography  06/08/2014 which showed: 1-no significant aortoiliac disease 2-chronically occluded left popliteal artery with two-vessel runoff below the knee with reasonable collaterals. 3-occluded right popliteal artery with poor collaterals. Two-vessel runoff below the knee with an occluded posterior tibial.  He performed successful angioplasty and drug coated balloon angioplasty of the right popliteal artery. At follow-up patient had resolution of claudication and ulcer healed completely.  However, at further follow-up--his note dated 11/29/14 states that there is evidence of restenosis in the right popliteal artery. However, patient was not having any claudication and also had healed completely. Therefore, Dr. Fletcher Anon recommended monitoring for now and repeat duplex in 3 months. Noted that the area was heavily calcified on angiography.   3. Aortic stenosis Sees Cardiology routinely, has Echo routinely  4. A. Fib--per Cardiology  5. DM type 2 (diabetes mellitus, type 2)  Excellent job with documenting blood sugar log. As well think this is giving him motivation to improve his diet and therefore get better blood sugar control.  - Hemoglobin A1c  MicroAlbumin 09/26/2014  He is currently on Lantus 60 units.  Also uses Humalog 5 units 3 times a day with meals.  He is now on ACE Inhibitor. I added this at 01/10/2014 OV.  He is on statin. He is on aspirin 81 mg daily  Does have routine ophthalmic exam. He recently got a report from Ophthalmologist-  Which states--3 #4 and #5 and #6 below   Diabetic Shoes--- I completed form for this at his visit 12/28/14. THIS FORM WAS COPIED AND SENT TO SCAN INTO EPIC. FORM ALSO FAXED IN TO [facility] [employer] Regarding his peripheral arterial disease--- the following is copied from his cardiovascular doctor's (Dr. Fletcher Anon) note 11/29/14. "He was seen in 2015 for worsening severe right calf claudication with skin breakdown on the right heel that was slow to  heal. Dr. Fletcher Anon proceeded with angiography 06/08/2014 which showed: 1-no significant aortoiliac disease 2-chronically occluded left popliteal artery with two-vessel runoff below the knee with reasonable collaterals. 3-occluded right popliteal artery with poor collaterals. Two-vessel runoff below the knee with an occluded posterior tibial.  He performed successful angioplasty and drug coated balloon angioplasty of the right popliteal artery. At follow-up patient had resolution of claudication and ulcer healed completely.  However, at further follow-up--his note dated 11/29/14 states that there is evidence of restenosis in the right popliteal artery. However, patient was not having any claudication and also had healed completely. Therefore, Dr. Fletcher Anon recommended monitoring for now and repeat duplex in 3 months. Noted that the area was heavily calcified on angiography.  6. Type II or unspecified type diabetes mellitus with ophthalmic manifestations, not stated as uncontrolled Per Ophthalmology 7. 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 Cataract associated with type 2 diabetes mellitus Per ophthalmology  9. Chronic kidney disease  10. HYPERTENSION, BENIGN Blood Pressure at goal. On ACE inhibitor.  11. HYPERLIPIDEMIA-MIXED On Lipitor 80. Had FLP/LFT 09/2014  12. Obstructive sleep apnea He wears CPAP. Managed by Dr. Rolm Baptise  13. Sleep apnea with use of continuous positive airway pressure (CPAP) See # 10  14. Obesity (BMI 30-39.9) See history of present illness  15. Subclinical hypothyroidism - TSH, Free T4 last checked 09/2014--   16. Urge incontinence - fesoterodine (TOVIAZ) 4 MG TB24 tablet; Take 1 tablet (4 mg total) by mouth daily.  Dispense: 30 tablet; Refill: 5   17. Screening PSA was done on -09/26/14--After this, can probably stop checking this given age 21.   18. Colonoscopy: He had colonoscopy 06/16/2009  19.  Immunizations: Pneumonia vaccine:  Pneumovax 23--was given 01/01/2011   Prevnar 13 today..--Given here 06/02/2014 Tetanus vaccine: ---Given here 06/02/2014 Influenza Vaccine--he did receive fall 2015    Regular office visit 3 months or sooner if needed.  [address] [place], [place], [facility] 12/28/2014 9:33 AM

## 2014-12-29 ENCOUNTER — Ambulatory Visit: Payer: Medicare Other | Admitting: Physician Assistant

## 2015-01-02 ENCOUNTER — Encounter: Payer: Self-pay | Admitting: Physician Assistant

## 2015-01-02 ENCOUNTER — Ambulatory Visit (INDEPENDENT_AMBULATORY_CARE_PROVIDER_SITE_OTHER): Payer: Medicare Other | Admitting: Physician Assistant

## 2015-01-02 VITALS — BP 144/70 | HR 68 | Temp 97.7°F | Resp 18 | Wt 247.0 lb

## 2015-01-02 DIAGNOSIS — I2510 Atherosclerotic heart disease of native coronary artery without angina pectoris: Secondary | ICD-10-CM

## 2015-01-02 DIAGNOSIS — N419 Inflammatory disease of prostate, unspecified: Secondary | ICD-10-CM

## 2015-01-02 DIAGNOSIS — N390 Urinary tract infection, site not specified: Secondary | ICD-10-CM | POA: Diagnosis not present

## 2015-01-02 DIAGNOSIS — R3915 Urgency of urination: Secondary | ICD-10-CM | POA: Diagnosis not present

## 2015-01-02 LAB — URINALYSIS, MICROSCOPIC ONLY
Casts: NONE SEEN
Crystals: NONE SEEN

## 2015-01-02 LAB — URINALYSIS, ROUTINE W REFLEX MICROSCOPIC
Bilirubin Urine: NEGATIVE
Glucose, UA: NEGATIVE mg/dL
Ketones, ur: NEGATIVE mg/dL
Nitrite: POSITIVE — AB
Protein, ur: NEGATIVE mg/dL
Specific Gravity, Urine: 1.015 (ref 1.005–1.030)
Urobilinogen, UA: 0.2 mg/dL (ref 0.0–1.0)
pH: 5.5 (ref 5.0–8.0)

## 2015-01-02 MED ORDER — CIPROFLOXACIN HCL 500 MG PO TABS: 500.0000 mg | ORAL_TABLET | Freq: Two times a day (BID) | ORAL | Status: DC

## 2015-01-02 NOTE — Progress Notes (Signed)
Patient ID: Steven Maldonado MRN: [id number], DOB: [birth year], 74 y.o. Date of Encounter: 01/02/2015, 5:07 PM    Chief Complaint:  Chief Complaint  Patient presents with  . c/o UTI    very strong oder     HPI: 74 y.o. year old white male reports that he has been having some urinary urgency recently. Also recently noticed malodorous urine. Has noticed no other signs or symptoms of infection. No dysuria. No pelvic pain or pressure. No discomfort in his prostate region. No costophrenic angle pain. No fevers or chills.     Home Meds:   Outpatient Prescriptions Prior to Visit  Medication Sig Dispense Refill  . aspirin 81 MG tablet Take 81 mg by mouth daily.    [name] Kitchen atorvastatin (LIPITOR) 80 MG tablet Take 1 tablet (80 mg total) by mouth at bedtime. 90 tablet 1  . Blood Glucose Monitoring Suppl (FREESTYLE LITE) DEVI 1 each by Does not apply route 2 (two) times daily. 1 each 0  . Choline Fenofibrate (TRILIPIX) 135 MG capsule Take 1 capsule (135 mg total) by mouth daily. 90 capsule 1  . dabigatran (PRADAXA) 150 MG CAPS capsule Take 1 capsule (150 mg total) by mouth every 12 (twelve) hours. 180 capsule 1  . docusate sodium (COLACE) 100 MG capsule Take 100 mg by mouth daily as needed for mild constipation.    . fesoterodine (TOVIAZ) 4 MG TB24 tablet Take 1 tablet (4 mg total) by mouth daily. 30 tablet 5  . furosemide (LASIX) 20 MG tablet Take 1 tablet (20 mg total) by mouth daily. 90 tablet 1  . glucose blood (FREESTYLE LITE) test strip 1 each by Other route 4 (four) times daily -  before meals and at bedtime. 150 each 11  . insulin glargine (LANTUS) 100 UNIT/ML injection Inject 0.6 mLs (60 Units total) into the skin daily. 60 mL 3  . insulin lispro (HUMALOG) 100 UNIT/ML KiwkPen Inject 0.05 mLs (5 Units total) into the skin 3 (three) times daily. 15 mL 3  . Insulin Pen Needle 32G X 4 MM MISC 1 each by Does not apply route 3 (three) times daily before meals. 100 each 11  . Insulin Syringe-Needle  U-100 (BD INSULIN SYRINGE ULTRAFINE) 31G X 5/16" 1 ML MISC 1 Syringe by Does not apply route daily. 100 each 3  . lisinopril (PRINIVIL,ZESTRIL) 10 MG tablet Take 1 tablet (10 mg total) by mouth daily. 90 tablet 3  . metFORMIN (GLUCOPHAGE) 1000 MG tablet Take 1 tablet (1,000 mg total) by mouth 2 (two) times daily with a meal. 90 tablet 3  . metoprolol (LOPRESSOR) 50 MG tablet Take 1 tablet (50 mg total) by mouth 2 (two) times daily. 180 tablet 1   No facility-administered medications prior to visit.    Allergies: No Known Allergies    Review of Systems: See HPI for pertinent ROS. All other ROS negative.    Physical Exam: Blood pressure 144/70, pulse 68, temperature 97.7 F (36.5 C), temperature source Oral, resp. rate 18, weight 247 lb (112.038 kg)., Body mass index is 33.49 kg/(m^2). General:  White male . Appears in no acute distress. Neck: Supple. No thyromegaly. No lymphadenopathy. Lungs: Clear bilaterally to auscultation without wheezes, rales, or rhonchi. Breathing is unlabored. Heart: Regular rhythm. No murmurs, rubs, or gallops. Abdomen: Soft, non-tender, non-distended with normoactive bowel sounds. No hepatomegaly. No rebound/guarding. No obvious abdominal masses. Msk:  Strength and tone normal for age. Back: No costophrenic angle tenderness with percussion bilaterally. DRE: Prostate exam normal.  No tenderness. Extremities/Skin: Warm and dry.  Neuro: Alert and oriented X 3. Moves all extremities spontaneously. Gait is normal. CNII-XII grossly in tact. Psych:  Responds to questions appropriately with a normal affect.     ASSESSMENT AND PLAN:  74 y.o. year old male with  1. Urinary tract infection without hematuria, site unspecified - ciprofloxacin (CIPRO) 500 MG tablet; Take 1 tablet (500 mg total) by mouth 2 (two) times daily.  Dispense: 28 tablet; Refill: 0 He is to start antibiotic immediately and take as directed and complete all of it. If symptoms are not completely  resolved upon completion of the antibiotic, then return for repeat urinalysis. - Urine culture  2. Prostatitis, unspecified prostatitis type - ciprofloxacin (CIPRO) 500 MG tablet; Take 1 tablet (500 mg total) by mouth 2 (two) times daily.  Dispense: 28 tablet; Refill: 0  3. Urgency of urination - Urinalysis, Routine w reflex microscopic   Signed, [address] [place], [place], [facility] 01/02/2015 5:07 PM

## 2015-01-05 LAB — URINE CULTURE: Colony Count: >=100000

## 2015-01-12 ENCOUNTER — Telehealth: Payer: Self-pay | Admitting: Physician Assistant

## 2015-01-12 MED ORDER — METFORMIN HCL 1000 MG PO TABS: 1000.0000 mg | ORAL_TABLET | Freq: Two times a day (BID) | ORAL | Status: DC

## 2015-01-12 NOTE — Telephone Encounter (Signed)
Medication refilled per protocol. 

## 2015-01-12 NOTE — Telephone Encounter (Signed)
[phone]  PT is needing 90 day supply of metFORMIN (GLUCOPHAGE) 1000 MG tablet [facility]

## 2015-01-23 ENCOUNTER — Telehealth: Payer: Self-pay | Admitting: Physician Assistant

## 2015-01-23 NOTE — Telephone Encounter (Signed)
Patient would like to discuss increasing one of his meds that I could not see on his med list, please call him at [phone]

## 2015-01-25 ENCOUNTER — Telehealth: Payer: Self-pay | Admitting: Physician Assistant

## 2015-01-25 NOTE — Telephone Encounter (Signed)
PT has called back wanting to speak to you about it [phone]

## 2015-01-25 NOTE — Telephone Encounter (Signed)
Patient calling about his Lisbeth Ply, said pharmacy faxed this over  [phone]

## 2015-01-26 NOTE — Telephone Encounter (Signed)
Call placed to patient. LMTRC.  

## 2015-01-27 NOTE — Telephone Encounter (Signed)
Call placed to patient. LMTRC.  

## 2015-02-09 MED ORDER — MIRABEGRON ER 25 MG PO TB24: 25.0000 mg | ORAL_TABLET | Freq: Every day | ORAL | Status: DC

## 2015-02-09 NOTE — Telephone Encounter (Signed)
He could add myrbetriq 25 mg poqday to the [place].

## 2015-02-09 NOTE — Telephone Encounter (Signed)
Wife made aware of med change and new RX to pharmacy

## 2015-02-09 NOTE — Telephone Encounter (Signed)
The Steven Maldonado is not strong enough. Can it be increased or changed to different med.

## 2015-02-28 ENCOUNTER — Ambulatory Visit (HOSPITAL_COMMUNITY): Payer: Medicare Other | Attending: Cardiology

## 2015-02-28 DIAGNOSIS — I739 Peripheral vascular disease, unspecified: Secondary | ICD-10-CM | POA: Insufficient documentation

## 2015-02-28 DIAGNOSIS — Z95820 Peripheral vascular angioplasty status with implants and grafts: Secondary | ICD-10-CM

## 2015-02-28 NOTE — Progress Notes (Signed)
Lower Extremity Arterial Duplex + ABI

## 2015-03-13 ENCOUNTER — Telehealth: Payer: Self-pay | Admitting: Physician Assistant

## 2015-03-13 MED ORDER — "INSULIN SYRINGE-NEEDLE U-100 31G X 5/16"" 1 ML MISC": 1.0000 | Freq: Every day | Status: DC

## 2015-03-13 NOTE — Telephone Encounter (Signed)
Refill sent to [address] [facility]

## 2015-03-13 NOTE — Telephone Encounter (Signed)
[facility] is needing a refill on bg ultra fine pen needles (pt used 3 day and they would like to have 90 day supply)  [phone]

## 2015-04-03 ENCOUNTER — Ambulatory Visit: Payer: Medicare Other | Admitting: Physician Assistant

## 2015-05-01 ENCOUNTER — Encounter: Payer: Self-pay | Admitting: Physician Assistant

## 2015-05-01 ENCOUNTER — Ambulatory Visit (INDEPENDENT_AMBULATORY_CARE_PROVIDER_SITE_OTHER): Payer: Medicare Other | Admitting: Physician Assistant

## 2015-05-01 VITALS — BP 110/82 | HR 68 | Temp 98.1°F | Resp 18 | Wt 251.0 lb

## 2015-05-01 DIAGNOSIS — E08339 Diabetes mellitus due to underlying condition with moderate nonproliferative diabetic retinopathy without macular edema: Secondary | ICD-10-CM

## 2015-05-01 DIAGNOSIS — E785 Hyperlipidemia, unspecified: Secondary | ICD-10-CM | POA: Diagnosis not present

## 2015-05-01 DIAGNOSIS — E669 Obesity, unspecified: Secondary | ICD-10-CM | POA: Diagnosis not present

## 2015-05-01 DIAGNOSIS — I350 Nonrheumatic aortic (valve) stenosis: Secondary | ICD-10-CM

## 2015-05-01 DIAGNOSIS — E1136 Type 2 diabetes mellitus with diabetic cataract: Secondary | ICD-10-CM

## 2015-05-01 DIAGNOSIS — I2510 Atherosclerotic heart disease of native coronary artery without angina pectoris: Secondary | ICD-10-CM | POA: Diagnosis not present

## 2015-05-01 DIAGNOSIS — I10 Essential (primary) hypertension: Secondary | ICD-10-CM | POA: Diagnosis not present

## 2015-05-01 DIAGNOSIS — E038 Other specified hypothyroidism: Secondary | ICD-10-CM | POA: Diagnosis not present

## 2015-05-01 DIAGNOSIS — I4891 Unspecified atrial fibrillation: Secondary | ICD-10-CM | POA: Diagnosis not present

## 2015-05-01 DIAGNOSIS — E1165 Type 2 diabetes mellitus with hyperglycemia: Secondary | ICD-10-CM | POA: Diagnosis not present

## 2015-05-01 DIAGNOSIS — E039 Hypothyroidism, unspecified: Secondary | ICD-10-CM

## 2015-05-01 DIAGNOSIS — G4730 Sleep apnea, unspecified: Secondary | ICD-10-CM

## 2015-05-01 DIAGNOSIS — I739 Peripheral vascular disease, unspecified: Secondary | ICD-10-CM

## 2015-05-01 DIAGNOSIS — N189 Chronic kidney disease, unspecified: Secondary | ICD-10-CM

## 2015-05-01 DIAGNOSIS — G4733 Obstructive sleep apnea (adult) (pediatric): Secondary | ICD-10-CM

## 2015-05-01 DIAGNOSIS — E119 Type 2 diabetes mellitus without complications: Secondary | ICD-10-CM | POA: Diagnosis not present
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
## 2015-05-02 LAB — COMPLETE METABOLIC PANEL WITH GFR
ALT: 24 U/L (ref 0–53)
AST: 25 U/L (ref 0–37)
Albumin: 4.1 g/dL (ref 3.5–5.2)
Alkaline Phosphatase: 22 U/L — ABNORMAL LOW (ref 39–117)
BUN: 24 mg/dL — ABNORMAL HIGH (ref 6–23)
CO2: 23 mEq/L (ref 19–32)
Calcium: 9.4 mg/dL (ref 8.4–10.5)
Chloride: 107 mEq/L (ref 96–112)
Creat: 1.33 mg/dL (ref 0.50–1.35)
GFR, Est African American: 62 mL/min
GFR, Est Non African American: 53 mL/min — ABNORMAL LOW
Glucose, Bld: 70 mg/dL (ref 70–99)
Potassium: 4.2 mEq/L (ref 3.5–5.3)
Sodium: 140 mEq/L (ref 135–145)
Total Bilirubin: 0.4 mg/dL (ref 0.2–1.2)
Total Protein: 6.7 g/dL (ref 6.0–8.3)

## 2015-05-02 LAB — HEMOGLOBIN A1C
Hgb A1c MFr Bld: 7.7 % — ABNORMAL HIGH (ref <5.7)
Mean Plasma Glucose: 174 mg/dL — ABNORMAL HIGH (ref <117)

## 2015-05-02 LAB — T4, FREE: Free T4: 0.93 ng/dL (ref 0.80–1.80)

## 2015-05-02 LAB — TSH: TSH: 4.039 u[IU]/mL (ref 0.350–4.500)

## 2015-05-03 ENCOUNTER — Encounter: Payer: Self-pay | Admitting: Family Medicine

## 2015-05-03 ENCOUNTER — Other Ambulatory Visit: Payer: Self-pay | Admitting: Family Medicine

## 2015-05-19 ENCOUNTER — Telehealth: Payer: Self-pay | Admitting: Physician Assistant

## 2015-05-19 MED ORDER — FUROSEMIDE 20 MG PO TABS: 20.0000 mg | ORAL_TABLET | Freq: Every day | ORAL | Status: DC

## 2015-05-19 MED ORDER — CHOLINE FENOFIBRATE 135 MG PO CPDR: 135.0000 mg | DELAYED_RELEASE_CAPSULE | Freq: Every day | ORAL | Status: DC

## 2015-05-19 MED ORDER — ATORVASTATIN CALCIUM 80 MG PO TABS: 80.0000 mg | ORAL_TABLET | Freq: Every day | ORAL | Status: DC

## 2015-05-19 NOTE — Telephone Encounter (Signed)
Patient is calling to get 1 year rx for his  Atorvastatin Furosemide  Fenofibrate  [facility]  If any questions call him at [phone]

## 2015-05-19 NOTE — Telephone Encounter (Signed)
Medication refilled per protocol. 

## 2015-05-25 ENCOUNTER — Telehealth: Payer: Self-pay | Admitting: Physician Assistant

## 2015-05-25 DIAGNOSIS — I10 Essential (primary) hypertension: Secondary | ICD-10-CM

## 2015-05-25 NOTE — Telephone Encounter (Signed)
Patient is calling for 1 year supply refill on his lisinopril and metaprolol  [facility] [address]  [phone]

## 2015-05-26 ENCOUNTER — Other Ambulatory Visit: Payer: Self-pay | Admitting: Family Medicine

## 2015-05-26 DIAGNOSIS — I10 Essential (primary) hypertension: Secondary | ICD-10-CM

## 2015-05-26 MED ORDER — METOPROLOL TARTRATE 50 MG PO TABS
50.0000 mg | ORAL_TABLET | Freq: Two times a day (BID) | ORAL | Status: DC
Start: 2015-05-26 — End: 2015-05-26

## 2015-05-26 MED ORDER — LISINOPRIL 10 MG PO TABS: 10.0000 mg | ORAL_TABLET | Freq: Every day | ORAL | Status: DC

## 2015-05-26 MED ORDER — METOPROLOL TARTRATE 50 MG PO TABS: 50.0000 mg | ORAL_TABLET | Freq: Two times a day (BID) | ORAL | Status: DC

## 2015-05-26 NOTE — Telephone Encounter (Signed)
Medication refilled per protocol. 

## 2015-05-30 ENCOUNTER — Ambulatory Visit (INDEPENDENT_AMBULATORY_CARE_PROVIDER_SITE_OTHER): Payer: Medicare Other | Admitting: Cardiovascular Disease

## 2015-05-30 ENCOUNTER — Encounter: Payer: Self-pay | Admitting: Cardiovascular Disease

## 2015-05-30 VITALS — BP 122/60 | HR 58 | Ht 72.0 in | Wt 254.0 lb

## 2015-05-30 DIAGNOSIS — I739 Peripheral vascular disease, unspecified: Secondary | ICD-10-CM

## 2015-05-30 DIAGNOSIS — I2510 Atherosclerotic heart disease of native coronary artery without angina pectoris: Secondary | ICD-10-CM | POA: Diagnosis not present

## 2015-05-30 NOTE — Assessment & Plan Note (Signed)
He is doing reasonably well with no recurrent claudication. Most recent duplex in April was reassuring. Recommend repeat study in one year. He cut himself on the left second toe. The cut itself is tiny  and should heal with conservative measures. I asked him to notify me if that's not the case. Otherwise follow-up in April. continue treatment of risk factors.

## 2015-05-30 NOTE — Progress Notes (Signed)
Primary Cardiologist: Dr. Angelena Form  HPI  This is a 74 year old male who is here today for a followup visit regarding  PAD . He has known hx of DM, HTN, hyperlipidemia, A-fib, moderate aortic stneosis and CAD.  He is s/p NSTEMI in the setting of atrial fibrillation with RVR in 12/2010.  Cardiac cath with occluded RCA with collaterals.Echo in December 2014 with LVEF=55-60%, moderate aortic stenosis, normal wall motion reported.   He was seen in 2015 for worsening severe  right calf claudication with skin breakdown on the right heal that was slow to heal. I proceeded with angiography on July 29 which showed:  1. No significant aortoiliac disease.  2. Chronically occluded left popliteal artery with 2 vessel runoff below the knee with reasonable collaterals.  3. Occluded heavily calcified right popliteal artery with poor collaterals. 2 vessel runoff below the knee with an occluded posterior tibial.   I performed successful angioplasty and drug-coated balloon angioplasty of the right popliteal artery. He had resolution of claudication since then and the ulcer healed completely.  He had recent duplex in in April which showed an ABI of 0.97 on the right and 0.82 on the left. Right popliteal artery was patent with a peak velocity of 266. He denies claudication. He has a tiny cut on the left second toe with no significant pain.    No Known Allergies   Current Outpatient Prescriptions on File Prior to Visit  Medication Sig Dispense Refill  . aspirin 81 MG tablet Take 81 mg by mouth daily.    [name] Kitchen atorvastatin (LIPITOR) 80 MG tablet Take 1 tablet (80 mg total) by mouth at bedtime. 90 tablet 3  . Blood Glucose Monitoring Suppl (FREESTYLE LITE) DEVI 1 each by Does not apply route 2 (two) times daily. 1 each 0  . Choline Fenofibrate (TRILIPIX) 135 MG capsule Take 1 capsule (135 mg total) by mouth daily. 90 capsule 3  . dabigatran (PRADAXA) 150 MG CAPS capsule Take 1 capsule (150 mg total) by mouth every 12  (twelve) hours. 180 capsule 1  . docusate sodium (COLACE) 100 MG capsule Take 100 mg by mouth daily as needed for mild constipation.    . furosemide (LASIX) 20 MG tablet Take 1 tablet (20 mg total) by mouth daily. 90 tablet 3  . glucose blood (FREESTYLE LITE) test strip 1 each by Other route 4 (four) times daily -  before meals and at bedtime. 150 each 11  . insulin glargine (LANTUS) 100 UNIT/ML injection Inject 0.6 mLs (60 Units total) into the skin daily. 60 mL 3  . insulin lispro (HUMALOG) 100 UNIT/ML KiwkPen Inject 0.05 mLs (5 Units total) into the skin 3 (three) times daily. 15 mL 3  . Insulin Pen Needle 32G X 4 MM MISC 1 each by Does not apply route 3 (three) times daily before meals. 100 each 11  . Insulin Syringe-Needle U-100 (BD INSULIN SYRINGE ULTRAFINE) 31G X 5/16" 1 ML MISC 1 Syringe by Does not apply route daily. 100 each 3  . Insulin Syringe-Needle U-100 (BD INSULIN SYRINGE ULTRAFINE) 31G X 5/16" 1 ML MISC 1 Syringe by Does not apply route daily. 100 each 3  . lisinopril (PRINIVIL,ZESTRIL) 10 MG tablet Take 1 tablet (10 mg total) by mouth daily. 90 tablet 3  . metFORMIN (GLUCOPHAGE) 1000 MG tablet Take 1 tablet (1,000 mg total) by mouth 2 (two) times daily with a meal. 180 tablet 1  . metoprolol (LOPRESSOR) 50 MG tablet Take 1 tablet (50 mg  total) by mouth 2 (two) times daily. 180 tablet 3  . MYRBETRIQ 25 MG TB24 tablet TAKE 1 TABLET (25 MG TOTAL) BY MOUTH DAILY. 30 tablet 5  . TOVIAZ 4 MG TB24 tablet TAKE 1 TABLET (4 MG TOTAL) BY MOUTH DAILY.  5   No current facility-administered medications on file prior to visit.     Past Medical History  Diagnosis Date  . Atrial fibrillation     patient is on pradaxa  . Other and unspecified hyperlipidemia   . Unspecified essential hypertension   . Coronary artery disease     Cath February 2012 in [place] [facility], occluded RCA with collaterals  . Aortic stenosis     Moderate by echo 8/12  . High cholesterol   . Heart disease   . CAD  (coronary artery disease)   . Chronic kidney disease 08/30/2013  . 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 Heart murmur   . DM type 2 (diabetes mellitus, type 2)   . Cataract associated with type 2 diabetes mellitus 10/06/2013  . Obstructive sleep apnea     2012  . Sleep apnea with use of continuous positive airway pressure (CPAP)     04-11-11 AHI was 32.9 and titrated to 15 cm H20, DME is [facility]  . Skin cancer     "cut off back of neck X 2; off left upper arm; right wrist, near right shoulder blade; several burned off other areas of my body" (06/08/2014)  . NSTEMI (non-ST elevated myocardial infarction) 12/2010  . Diabetic ulcer of heel 07/14/2014     Past Surgical History  Procedure Laterality Date  . Appendectomy  1965  . Cardioversion  07/2011  . Skin cancer excision Bilateral     "have had them cut off back of neck X 2; off left upper arm; right wrist, near right shoulder blade" (06/08/2014)  . Cardioversion N/A 04/18/2014    Procedure: CARDIOVERSION;  Surgeon: Dorothy Spark, MD;  Location: [facility];  Service: Cardiovascular;  Laterality: N/A;  . Popliteal artery angioplasty Right 06/08/2014    Archie Endo 06/08/2014  . Cardiac catheterization  12/2010  . Abdominal angiogram N/A 06/08/2014    Procedure: ABDOMINAL ANGIOGRAM;  Surgeon: Wellington Hampshire, MD;  Location: [facility] CATH LAB;  Service: Cardiovascular;  Laterality: N/A;  . Lower extremity angiogram N/A 06/08/2014    Procedure: LOWER EXTREMITY ANGIOGRAM;  Surgeon: Wellington Hampshire, MD;  Location: [place] CATH LAB;  Service: Cardiovascular;  Laterality: N/A;     Family History  Problem Relation Age of Onset  . Diabetes Mother   . Heart attack Father   . Heart failure Father   . Diabetes Other   . Diabetes Sister   . Diabetes Brother   . Diabetes Daughter     TYPE ll  . Heart Problems Daughter   . Heart attack Mother      History   Social History  . Marital Status: Married    Spouse Name: [name]  . Number of Children: 1  .  Years of Education: 12   Occupational History  . retired    Social History Main Topics  . Smoking status: Former Smoker -- 2.00 packs/day for 14 years    Types: Cigarettes    Quit date: 11/11/1972  . Smokeless tobacco: Never Used  . Alcohol Use: No     Comment: quit in 1984  . Drug Use: No  . Sexual Activity: Not Currently   Other Topics Concern  . 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   Patient has one child and his wife has one child.   Patient is retired.   Patient has a high school education.   Patient is right-handed.   Patient drinks very little caffeine.     ROS A 10 point review of system was performed. It is negative other than that mentioned in the history of present illness.   PHYSICAL EXAM   BP 122/60 mmHg  Pulse 58  Ht 6' (1.829 m)  Wt 254 lb (115.214 kg)  BMI 34.44 kg/m2 Constitutional: He is oriented to person, place, and time. He appears well-developed and well-nourished. No distress.  HENT: No nasal discharge.  Head: Normocephalic and atraumatic.  Eyes: Pupils are equal and round.  No discharge. Neck: Normal range of motion. Neck supple. No JVD present. No thyromegaly present.  Cardiovascular: Normal rate, regular rhythm, normal heart sounds. Exam reveals no gallop and no friction rub.there is a 3/6 systolic ejection murmur in the aortic area which is mid peaking .  Pulmonary/Chest: Effort normal and breath sounds normal. No stridor. No respiratory distress. He has no wheezes. He has no rales. He exhibits no tenderness.  Abdominal: Soft. Bowel sounds are normal. He exhibits no distension. There is no tenderness. There is no rebound and no guarding.  Musculoskeletal: Normal range of motion. He exhibits no edema and no tenderness.  Neurological: He is alert and oriented to person, place, and time. Coordination normal.  Skin: Skin is warm and dry. No rash noted. He is not diaphoretic. No  erythema. No pallor.  Psychiatric: He has a normal mood and affect. His behavior is normal. Judgment and thought content normal.  Vascular: radial pulse is very faint bilaterally , Femoral: right +2, left :+1, distal pulses are faint on the right not palpable on the left.      ASSESSMENT AND PLAN

## 2015-05-30 NOTE — Patient Instructions (Signed)
Medication Instructions:  Your physician recommends that you continue on your current medications as directed. Please refer to the Current Medication list given to you today.  Labwork: No new orders.   Testing/Procedures: Your physician has requested that you have a lower extremity arterial duplex in April 2017. This test is an ultrasound of the arteries in the legs. It looks at arterial blood flow in the legs. Allow one hour for Lower Arterial scans. There are no restrictions or special instructions  Follow-Up: Your physician wants you to follow-up in: April 2017 with Dr Fletcher Anon.  You will receive a reminder letter in the mail two months in advance. If you don't receive a letter, please call our office to schedule the follow-up appointment.   Any Other Special Instructions Will Be Listed Below (If Applicable).

## 2015-06-20 ENCOUNTER — Other Ambulatory Visit: Payer: Self-pay | Admitting: Family Medicine

## 2015-06-20 DIAGNOSIS — E119 Type 2 diabetes mellitus without complications: Secondary | ICD-10-CM

## 2015-06-20 MED ORDER — INSULIN GLARGINE 100 UNIT/ML ~~LOC~~ SOLN: 60.0000 [IU] | Freq: Every day | SUBCUTANEOUS | Status: DC

## 2015-06-20 NOTE — Telephone Encounter (Signed)
Medication refilled per protocol. 

## 2015-07-18 ENCOUNTER — Other Ambulatory Visit: Payer: Self-pay | Admitting: Family Medicine

## 2015-07-18 ENCOUNTER — Telehealth: Payer: Self-pay

## 2015-07-18 ENCOUNTER — Encounter: Payer: Self-pay | Admitting: Adult Health

## 2015-07-18 ENCOUNTER — Ambulatory Visit (INDEPENDENT_AMBULATORY_CARE_PROVIDER_SITE_OTHER): Payer: Medicare Other | Admitting: Adult Health

## 2015-07-18 VITALS — BP 136/66 | HR 61 | Ht 71.0 in | Wt 255.5 lb

## 2015-07-18 DIAGNOSIS — I2510 Atherosclerotic heart disease of native coronary artery without angina pectoris: Secondary | ICD-10-CM

## 2015-07-18 DIAGNOSIS — G4733 Obstructive sleep apnea (adult) (pediatric): Secondary | ICD-10-CM

## 2015-07-18 DIAGNOSIS — Z9989 Dependence on other enabling machines and devices: Secondary | ICD-10-CM

## 2015-07-18 MED ORDER — METFORMIN HCL 1000 MG PO TABS: 1000.0000 mg | ORAL_TABLET | Freq: Two times a day (BID) | ORAL | Status: DC

## 2015-07-18 NOTE — Patient Instructions (Signed)
Continue CPAP nightly Will send an order to have mask refitted If your symptoms worsen or you develop new symptoms please let us know.

## 2015-07-18 NOTE — Progress Notes (Signed)
I agree with the assessment and plan as directed by NP .The patient is known to me .i will see him on his next visit.    Steven Elman, MD

## 2015-07-18 NOTE — Telephone Encounter (Signed)
Rn call patient to find out what company he has he cipap at. Pt stated he can call back once he gets know. He only knows that its in [place]. Pt will call back with the company name once he gets home.

## 2015-07-18 NOTE — Telephone Encounter (Signed)
Dme order fax to [facility] for [facility]. Fax confirmation.

## 2015-07-18 NOTE — Telephone Encounter (Signed)
Rn talk to Samson at [facility] in [place]. Pt receives his machine at this company. The number to the company is [phone]. Their fax number is [phone]. Beth confirm pt is in their data base, and the DME orders can be fax over.

## 2015-07-18 NOTE — Progress Notes (Signed)
PATIENT: Steven Maldonado DOB: Aug 11, 1941  REASON FOR VISIT: follow up- OSA on CPAP HISTORY FROM: patient  HISTORY OF PRESENT ILLNESS: Steven Maldonado is a 74 year old male with a history of obstructive sleep apnea on CPAP. He returns today for a compliance download. His download indicates that he uses his machine 27 out of 30 days for compliance of 90%. He uses his machine greater than 4 hours 23 out of 30 days for compliance of 77%. On average he uses his machine 7 hours and 36 minutes. His residual AHI is 2.2 on 14 cm of water with EPR of 3. The patient does have a significant leak. His leak in the 95th percentile is 110.0 L/m. The patient does confirm that his mask leaks during the night. He reports that his wife also complains of hearing the mask leak. The patient's Epworth sleepiness score is 3 was previously 2. Overall he does feel that the CPAP is beneficial. He tries to use the CPAP nightly however if he is  out of town for one night he typically does not take his machine with him. He typically goes to bed around midnight and will get up around 8 AM. He returns today for an evaluation.  HISTORY (DOHMEIER) 07/14/14: Steven Maldonado is a 74 y.o. male Is seen here as a revisit from Dr. Doren Custard for follow up on CPAP compliance.   [name] is seen 07-14-14 for his yearly revisit. The patient has been using her CPAP machine compliantly for an average of 5 hours and 27 minutes nightly, his residual AHI is 1.0 indicating a very good response.  Currently has machine is set at 14 cm pressure the 3 cm EPR he does have some significant air leaks and his compliance is 71%. [name] has switched to medical equipment company from previously advanced on care to a local office and asked oral require yearly download and I would like for these to be available at his September yearly visit. His response to the CPAP indicates that he is indeed helped by CPAP he was.  His geriatric depression score was Zero , Epworth  sleepiness score at 2 , the fatigue severity score at 11 points. These are excellent results. The patient had no falls in the last 6 months. He had a DVT at the right knee, and underwent an angioplasty of the leg artery, developed an ulcer at the heel- he [place] seen at the wound care center. He is walking daily with a cane, but this is a problem with weight on the heel. He gained some weight . His sleep habits have not changed. He wakes up refreshed and restored.    REVIEW OF SYSTEMS: Out of a complete 14 system review of symptoms, the patient complains only of the following symptoms, and all other reviewed systems  are negative.  See history of present illness  ALLERGIES: No Known Allergies  HOME MEDICATIONS: Outpatient Prescriptions Prior to Visit  Medication Sig Dispense Refill  . aspirin 81 MG tablet Take 81 mg by mouth daily.    [name] Kitchen atorvastatin (LIPITOR) 80 MG tablet Take 1 tablet (80 mg total) by mouth at bedtime. 90 tablet 3  . Blood Glucose Monitoring Suppl (FREESTYLE LITE) DEVI 1 each by Does not apply route 2 (two) times daily. 1 each 0  . Choline Fenofibrate (TRILIPIX) 135 MG capsule Take 1 capsule (135 mg total) by mouth daily. 90 capsule 3  . dabigatran (PRADAXA) 150 MG CAPS capsule Take 1 capsule (150 mg total)  by mouth every 12 (twelve) hours. 180 capsule 1  . docusate sodium (COLACE) 100 MG capsule Take 100 mg by mouth daily as needed for mild constipation.    . furosemide (LASIX) 20 MG tablet Take 1 tablet (20 mg total) by mouth daily. 90 tablet 3  . glucose blood (FREESTYLE LITE) test strip 1 each by Other route 4 (four) times daily -  before meals and at bedtime. 150 each 11  . insulin glargine (LANTUS) 100 UNIT/ML injection Inject 0.6 mLs (60 Units total) into the skin daily. 60 mL 3  . insulin lispro (HUMALOG) 100 UNIT/ML KiwkPen Inject 0.05 mLs (5 Units total) into the skin 3 (three) times daily. 15 mL 3  . Insulin Pen Needle 32G X 4 MM MISC 1 each by Does not apply  route 3 (three) times daily before meals. 100 each 11  . Insulin Syringe-Needle U-100 (BD INSULIN SYRINGE ULTRAFINE) 31G X 5/16" 1 ML MISC 1 Syringe by Does not apply route daily. 100 each 3  . Insulin Syringe-Needle U-100 (BD INSULIN SYRINGE ULTRAFINE) 31G X 5/16" 1 ML MISC 1 Syringe by Does not apply route daily. 100 each 3  . lisinopril (PRINIVIL,ZESTRIL) 10 MG tablet Take 1 tablet (10 mg total) by mouth daily. 90 tablet 3  . metFORMIN (GLUCOPHAGE) 1000 MG tablet Take 1 tablet (1,000 mg total) by mouth 2 (two) times daily with a meal. 180 tablet 1  . metoprolol (LOPRESSOR) 50 MG tablet Take 1 tablet (50 mg total) by mouth 2 (two) times daily. 180 tablet 3  . MYRBETRIQ 25 MG TB24 tablet TAKE 1 TABLET (25 MG TOTAL) BY MOUTH DAILY. 30 tablet 5  . TOVIAZ 4 MG TB24 tablet TAKE 1 TABLET (4 MG TOTAL) BY MOUTH DAILY.  5   No facility-administered medications prior to visit.    PAST MEDICAL HISTORY: Past Medical History  Diagnosis Date  . Atrial fibrillation     patient is on pradaxa  . Other and unspecified hyperlipidemia   . Unspecified essential hypertension   . Coronary artery disease     Cath February 2012 in [place] [facility], occluded RCA with collaterals  . Aortic stenosis     Moderate by echo 8/12  . High cholesterol   . Heart disease   . CAD (coronary artery disease)   . Chronic kidney disease 08/30/2013  . 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 Heart murmur   . DM type 2 (diabetes mellitus, type 2)   . Cataract associated with type 2 diabetes mellitus 10/06/2013  . Obstructive sleep apnea     2012  . Sleep apnea with use of continuous positive airway pressure (CPAP)     04-11-11 AHI was 32.9 and titrated to 15 cm H20, DME is [facility]  . Skin cancer     "cut off back of neck X 2; off left upper arm; right wrist, near right shoulder blade; several burned off other areas of my body" (06/08/2014)  . NSTEMI (non-ST elevated myocardial infarction) 12/2010  . Diabetic ulcer of heel 07/14/2014     PAST SURGICAL HISTORY: Past Surgical History  Procedure Laterality Date  . Appendectomy  1965  . Cardioversion  07/2011  . Skin cancer excision Bilateral     "have had them cut off back of neck X 2; off left upper arm; right wrist, near right shoulder blade" (06/08/2014)  . Cardioversion N/A 04/18/2014    Procedure: CARDIOVERSION;  Surgeon: Dorothy Spark, MD;  Location: [facility];  Service: Cardiovascular;  Laterality:  N/A;  . Popliteal artery angioplasty Right 06/08/2014    Archie Endo 06/08/2014  . Cardiac catheterization  12/2010  . Abdominal angiogram N/A 06/08/2014    Procedure: ABDOMINAL ANGIOGRAM;  Surgeon: Wellington Hampshire, MD;  Location: [facility] CATH LAB;  Service: Cardiovascular;  Laterality: N/A;  . Lower extremity angiogram N/A 06/08/2014    Procedure: LOWER EXTREMITY ANGIOGRAM;  Surgeon: Wellington Hampshire, MD;  Location: [place] CATH LAB;  Service: Cardiovascular;  Laterality: N/A;    FAMILY HISTORY: Family History  Problem Relation Age of Onset  . Diabetes Mother   . Heart attack Mother   . Heart attack Father   . Heart failure Father   . Diabetes Other   . Diabetes Sister   . Diabetes Brother   . Diabetes Daughter     TYPE ll  . Heart Problems Daughter     SOCIAL HISTORY: Social History   Social History  . Marital Status: Married    Spouse Name: [name]  . Number of Children: 1  . Years of Education: 12   Occupational History  . retired    Social History Main Topics  . Smoking status: Former Smoker -- 2.00 packs/day for 14 years    Types: Cigarettes    Quit date: 11/11/1972  . Smokeless tobacco: Never Used  . Alcohol Use: No     Comment: quit in 1984  . Drug Use: No  . Sexual Activity: Not Currently   Other Topics Concern  . 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   Patient has one child and his wife has one child.   Patient is retired.   Patient has a high school education.   Patient is  right-handed.   Patient drinks very little caffeine.      PHYSICAL EXAM  Filed Vitals:   07/18/15 1328  BP: 136/66  Pulse: 61  Height: 5\' 11"  (1.803 m)  Weight: 255 lb 8 oz (115.894 kg)   Body mass index is 35.65 kg/(m^2).  Generalized: Well developed, in no acute distress  NECK: Circumference 21 inches, Mallampati 4+  Neurological examination  Mentation: Alert oriented to time, place, history taking. Follows all commands speech and language fluent Cranial nerve II-XII: Pupils were equal round reactive to light. Extraocular movements were full, visual field were full on confrontational test. Facial sensation and strength were normal. Uvula tongue midline. Head turning and shoulder shrug  were normal and symmetric. Motor: The motor testing reveals 5 over 5 strength of all 4 extremities. Good symmetric motor tone is noted throughout.  Sensory: Sensory testing is intact to soft touch on all 4 extremities. No evidence of extinction is noted.  Coordination: Cerebellar testing reveals good finger-nose-finger and heel-to-shin bilaterally.  Gait and station: Gait is normal. Tandem gait is normal. Romberg is negative. No drift is seen.  Reflexes: Deep tendon reflexes are symmetric and normal bilaterally.   DIAGNOSTIC DATA (LABS, IMAGING, TESTING) - I reviewed patient records, labs, notes, testing and imaging myself where available.  Lab Results  Component Value Date   WBC 6.1 06/08/2014   HGB 12.2* 06/08/2014   HCT 36.8* 06/08/2014   MCV 94.1 06/08/2014   PLT 193 06/08/2014      Component Value Date/Time   NA 140 05/01/2015 1556   K 4.2 05/01/2015 1556   CL 107 05/01/2015 1556   CO2 23 05/01/2015 1556   GLUCOSE 70 05/01/2015 1556   BUN 24*  05/01/2015 1556   CREATININE 1.33 05/01/2015 1556   CREATININE 1.22 06/09/2014 0818   CALCIUM 9.4 05/01/2015 1556   PROT 6.7 05/01/2015 1556   ALBUMIN 4.1 05/01/2015 1556   AST 25 05/01/2015 1556   ALT 24 05/01/2015 1556   ALKPHOS 22*  05/01/2015 1556   BILITOT 0.4 05/01/2015 1556   GFRNONAA 53* 05/01/2015 1556   GFRNONAA 58* 06/09/2014 0818   GFRAA 62 05/01/2015 1556   GFRAA 68* 06/09/2014 0818   Lab Results  Component Value Date   CHOL 151 09/26/2014   HDL 30* 09/26/2014   LDLCALC 91 09/26/2014   TRIG 149 09/26/2014   CHOLHDL 5.0 09/26/2014   Lab Results  Component Value Date   HGBA1C 7.7* 05/01/2015    Lab Results  Component Value Date   TSH 4.039 05/01/2015      ASSESSMENT AND PLAN 74 y.o. year old male  has a past medical history of Atrial fibrillation; Other and unspecified hyperlipidemia; Unspecified essential hypertension; Coronary artery disease; Aortic stenosis; High cholesterol; Heart disease; CAD (coronary artery disease); Chronic kidney disease (08/30/2013); Subclinical hypothyroidism (08/30/2013); Heart murmur; DM type 2 (diabetes mellitus, type 2); Cataract associated with type 2 diabetes mellitus (10/06/2013); Obstructive sleep apnea; Sleep apnea with use of continuous positive airway pressure (CPAP); Skin cancer; NSTEMI (non-ST elevated myocardial infarction) (12/2010); and Diabetic ulcer of heel (07/14/2014). here with:  1. OSA on CPAP  The patient's download is excellent. The patient has great compliance. The patient does have a significant leak. I will send in order to his DME company to have his mask and straps refitted. Patient will return in 6 months for a another download. Patient advised that if he does not feel that after having his mask refitted the leak resolves he should let us know. He will follow-up in 6 months or sooner.    Ward Givens, MSN, NP-C 07/18/2015, 1:53 PM [facility] [address] [place], [place] [address] [phone]

## 2015-07-18 NOTE — Telephone Encounter (Signed)
Patient called back to let you know his CPAP company is [employer].

## 2015-07-18 NOTE — Telephone Encounter (Signed)
Medication refilled per protocol. 

## 2015-07-20 ENCOUNTER — Ambulatory Visit (INDEPENDENT_AMBULATORY_CARE_PROVIDER_SITE_OTHER): Payer: Medicare Other | Admitting: Cardiovascular Disease

## 2015-07-20 ENCOUNTER — Encounter: Payer: Self-pay | Admitting: Cardiovascular Disease

## 2015-07-20 VITALS — BP 98/62 | HR 53 | Ht 71.0 in | Wt 253.0 lb

## 2015-07-20 DIAGNOSIS — E785 Hyperlipidemia, unspecified: Secondary | ICD-10-CM | POA: Diagnosis not present

## 2015-07-20 DIAGNOSIS — I480 Paroxysmal atrial fibrillation: Secondary | ICD-10-CM | POA: Diagnosis not present

## 2015-07-20 DIAGNOSIS — I10 Essential (primary) hypertension: Secondary | ICD-10-CM | POA: Diagnosis not present

## 2015-07-20 DIAGNOSIS — I350 Nonrheumatic aortic (valve) stenosis: Secondary | ICD-10-CM

## 2015-07-20 DIAGNOSIS — I2510 Atherosclerotic heart disease of native coronary artery without angina pectoris: Secondary | ICD-10-CM | POA: Diagnosis not present

## 2015-07-20 DIAGNOSIS — I739 Peripheral vascular disease, unspecified: Secondary | ICD-10-CM

## 2015-07-20 NOTE — Patient Instructions (Signed)

## 2015-07-20 NOTE — Progress Notes (Signed)
Chief Complaint  Patient presents with  . Follow-up    History of Present Illness: 74 yo WM with h/o DM, HTN, hyperlipidemia and CAD here for cardiac follow up. I saw him as a new patient 01/11/11. Prior to his first appt, he woke up while in [place] and felt weak and sweaty. He was found to have atrial fibrillation with RVR. He ruled in for an MI with cardiac enzymes. Cardiac cath with occluded RCA with collaterals. He was hospitalized on 12/27/10 for four days at [facility]. He was started on Pradaxa for anticoagulation. DCCV on 08/02/11 with conversion to sinus rhythm. Echo February 2016 with normal LV function, moderate aortic stenosis. His PAD is followed by Dr. Fletcher Anon for claudication. 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 Most recent non-invasive LE testing April 2016 was stable.    He is here today for follow up. He is still in sinus rhythm. No palpitations. No chest pain or SOB. He feels great.   Primary Care Physician: Dena Billet  Last Lipid Profile: Followed in primary care.   Past Medical History  Diagnosis Date  . Atrial fibrillation     patient is on pradaxa  . Other and unspecified hyperlipidemia   . Unspecified essential hypertension   . Coronary artery disease     Cath February 2012 in [facility] [facility], occluded RCA with collaterals  . Aortic stenosis     Moderate by echo 8/12  . High cholesterol   . Heart disease   . CAD (coronary artery disease)   . Chronic kidney disease 08/30/2013  . 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 Heart murmur   . DM type 2 (diabetes mellitus, type 2)   . Cataract associated with type 2 diabetes mellitus 10/06/2013  . Obstructive sleep apnea     2012  . Sleep apnea with use of continuous positive airway pressure (CPAP)     04-11-11 AHI was 32.9 and titrated to 15 cm H20, DME is [facility]  . Skin cancer     "cut off back of neck X 2; off left upper arm; right wrist, near right shoulder  blade; several burned off other areas of my body" (06/08/2014)  . NSTEMI (non-ST elevated myocardial infarction) 12/2010  . Diabetic ulcer of heel 07/14/2014    Past Surgical History  Procedure Laterality Date  . Appendectomy  1965  . Cardioversion  07/2011  . Skin cancer excision Bilateral     "have had them cut off back of neck X 2; off left upper arm; right wrist, near right shoulder blade" (06/08/2014)  . Cardioversion N/A 04/18/2014    Procedure: CARDIOVERSION;  Surgeon: Dorothy Spark, MD;  Location: [place];  Service: Cardiovascular;  Laterality: N/A;  . Popliteal artery angioplasty Right 06/08/2014    Archie Endo 06/08/2014  . Cardiac catheterization  12/2010  . Abdominal angiogram N/A 06/08/2014    Procedure: ABDOMINAL ANGIOGRAM;  Surgeon: Wellington Hampshire, MD;  Location: [facility] CATH LAB;  Service: Cardiovascular;  Laterality: N/A;  . Lower extremity angiogram N/A 06/08/2014    Procedure: LOWER EXTREMITY ANGIOGRAM;  Surgeon: Wellington Hampshire, MD;  Location: [facility] CATH LAB;  Service: Cardiovascular;  Laterality: N/A;    Current Outpatient Prescriptions  Medication Sig Dispense Refill  . aspirin 81 MG tablet Take 81 mg by mouth daily.    [name] Kitchen atorvastatin (LIPITOR) 80 MG tablet Take 1 tablet (80 mg total) by mouth at bedtime. 90 tablet 3  . Blood Glucose  Monitoring Suppl (FREESTYLE LITE) DEVI 1 each by Does not apply route 2 (two) times daily. 1 each 0  . Choline Fenofibrate (TRILIPIX) 135 MG capsule Take 1 capsule (135 mg total) by mouth daily. 90 capsule 3  . dabigatran (PRADAXA) 150 MG CAPS capsule Take 1 capsule (150 mg total) by mouth every 12 (twelve) hours. 180 capsule 1  . docusate sodium (COLACE) 100 MG capsule Take 100 mg by mouth daily as needed for mild constipation.    . furosemide (LASIX) 20 MG tablet Take 1 tablet (20 mg total) by mouth daily. 90 tablet 3  . glucose blood (FREESTYLE LITE) test strip 1 each by Other route 4 (four) times daily -  before meals and at bedtime. 150 each  11  . insulin glargine (LANTUS) 100 UNIT/ML injection Inject 0.6 mLs (60 Units total) into the skin daily. 60 mL 3  . insulin lispro (HUMALOG) 100 UNIT/ML KiwkPen Inject 0.05 mLs (5 Units total) into the skin 3 (three) times daily. 15 mL 3  . Insulin Pen Needle 32G X 4 MM MISC 1 each by Does not apply route 3 (three) times daily before meals. 100 each 11  . Insulin Syringe-Needle U-100 (BD INSULIN SYRINGE ULTRAFINE) 31G X 5/16" 1 ML MISC 1 Syringe by Does not apply route daily. 100 each 3  . Insulin Syringe-Needle U-100 (BD INSULIN SYRINGE ULTRAFINE) 31G X 5/16" 1 ML MISC 1 Syringe by Does not apply route daily. 100 each 3  . lisinopril (PRINIVIL,ZESTRIL) 10 MG tablet Take 1 tablet (10 mg total) by mouth daily. 90 tablet 3  . metFORMIN (GLUCOPHAGE) 1000 MG tablet Take 1 tablet (1,000 mg total) by mouth 2 (two) times daily with a meal. 180 tablet 1  . metoprolol (LOPRESSOR) 50 MG tablet Take 1 tablet (50 mg total) by mouth 2 (two) times daily. 180 tablet 3  . MYRBETRIQ 25 MG TB24 tablet TAKE 1 TABLET (25 MG TOTAL) BY MOUTH DAILY. 30 tablet 5  . TOVIAZ 4 MG TB24 tablet TAKE 1 TABLET (4 MG TOTAL) BY MOUTH DAILY.  5   No current facility-administered medications for this visit.    No Known Allergies  Social History   Social History  . Marital Status: Married    Spouse Name: [name]  . Number of Children: 1  . Years of Education: 12   Occupational History  . retired    Social History Main Topics  . Smoking status: Former Smoker -- 2.00 packs/day for 14 years    Types: Cigarettes    Quit date: 11/11/1972  . Smokeless tobacco: Never Used  . Alcohol Use: No     Comment: quit in 1984  . Drug Use: No  . Sexual Activity: Not Currently   Other Topics Concern  . 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   Patient has one child and his wife has one child.   Patient is retired.   Patient has a high school education.   Patient  is right-handed.   Patient drinks very little caffeine.    Family History  Problem Relation Age of Onset  . Diabetes Mother   . Heart attack Mother   . Heart attack Father   . Heart failure Father   . Diabetes Other   . Diabetes Sister   . Diabetes Brother   . Diabetes Daughter     TYPE ll  . Heart Problems Daughter  Review of Systems:  As stated in the HPI and otherwise negative.   BP 98/62 mmHg  Pulse 53  Ht 5\' 11"  (1.803 m)  Wt 253 lb (114.76 kg)  BMI 35.30 kg/m2  SpO2 95%  Physical Examination: General: Well developed, well nourished, NAD HEENT: OP clear, mucus membranes moist SKIN: warm, dry. No rashes. Neuro: No focal deficits Musculoskeletal: Muscle strength 5/5 all ext Psychiatric: Mood and affect normal Neck: No JVD, no carotid bruits, no thyromegaly, no lymphadenopathy. Lungs:Clear bilaterally, no wheezes, rhonci, crackles Cardiovascular: Regular rate and rhythm. Systolic murmur, gallops or rubs. Abdomen:Soft. Bowel sounds present. Non-tender.  Extremities: No lower extremity edema. Pulses are 2 + in the bilateral DP/PT  Echo: February 2016: Left ventricle: The cavity size was normal. Wall thickness was increased in a pattern of mild LVH. Systolic function was normal. The estimated ejection fraction was in the range of 60% to 65%. Wall motion was normal; there were no regional wall motion abnormalities. Features are consistent with a pseudonormal left ventricular filling pattern, with concomitant abnormal relaxation and increased filling pressure (grade 2 diastolic dysfunction). - Aortic valve: There was moderate stenosis. - Left atrium: The atrium was mildly dilated. - Right atrium: The atrium was mildly dilated.  EKG:  EKG is ordered today. The ekg ordered today demonstrates Sinus, rate 50 bpm. Diffuse T wave abn.   Recent Labs: 05/01/2015: ALT 24; BUN 24*; Creat 1.33; Potassium 4.2; Sodium 140; TSH 4.039   Lipid Panel    Component  Value Date/Time   CHOL 151 09/26/2014 0902   TRIG 149 09/26/2014 0902   HDL 30* 09/26/2014 0902   CHOLHDL 5.0 09/26/2014 0902   VLDL 30 09/26/2014 0902   LDLCALC 91 09/26/2014 0902     Wt Readings from Last 3 Encounters:  07/20/15 253 lb (114.76 kg)  07/18/15 255 lb 8 oz (115.894 kg)  05/30/15 254 lb (115.214 kg)     Other studies Reviewed: Additional studies/ records that were reviewed today include: . Review of the above records demonstrates:    Assessment and Plan:   1. CAD: Stable. No changes. Continue current meds.   2. Aortic stenosis: Moderate by echo February 2016. Repeat one year.   3. ATRIAL FIBRILLATION: Maintaining NSR. Continue beta blocker. Continue Pradaxa for anti-coagulation.   4. HTN: BP controlled. Continue current therapy.   5. HLD: He is on a statin. Lipids followed in primary care  6. PAD: followed by Dr. Fletcher Anon. Stable.   Current medicines are reviewed at length with the patient today.  The patient does not have concerns regarding medicines.  The following changes have been made:  no change  Labs/ tests ordered today include:   Orders Placed This Encounter  Procedures  . EKG 12-Lead    Disposition:   FU with me in 6 months  Signed, [name], MD 07/20/2015 10:13 AM    [address] [place], [place], [place]  [address] Phone: [phone]; Fax: [phone]

## 2015-08-02 ENCOUNTER — Ambulatory Visit: Payer: Medicare Other | Admitting: Physician Assistant

## 2015-08-14 DIAGNOSIS — D485 Neoplasm of uncertain behavior of skin: Secondary | ICD-10-CM | POA: Diagnosis not present

## 2015-08-14 DIAGNOSIS — Z85828 Personal history of other malignant neoplasm of skin: Secondary | ICD-10-CM | POA: Diagnosis not present

## 2015-08-14 DIAGNOSIS — D0462 Carcinoma in situ of skin of left upper limb, including shoulder: Secondary | ICD-10-CM | POA: Diagnosis not present

## 2015-08-14 DIAGNOSIS — L570 Actinic keratosis: Secondary | ICD-10-CM | POA: Diagnosis not present

## 2015-08-14 DIAGNOSIS — X32XXXA Exposure to sunlight, initial encounter: Secondary | ICD-10-CM | POA: Diagnosis not present

## 2015-08-29 DIAGNOSIS — H6123 Impacted cerumen, bilateral: Secondary | ICD-10-CM | POA: Diagnosis not present

## 2015-08-31 DIAGNOSIS — D0462 Carcinoma in situ of skin of left upper limb, including shoulder: Secondary | ICD-10-CM | POA: Diagnosis not present

## 2015-09-11 ENCOUNTER — Other Ambulatory Visit: Payer: Self-pay | Admitting: Cardiovascular Disease

## 2015-09-11 DIAGNOSIS — I739 Peripheral vascular disease, unspecified: Secondary | ICD-10-CM

## 2015-09-18 ENCOUNTER — Ambulatory Visit (HOSPITAL_COMMUNITY)
Admission: RE | Admit: 2015-09-18 | Discharge: 2015-09-18 | Disposition: A | Discharge status: Home / Self Care | Payer: Medicare Other | Source: Ambulatory Visit | Attending: Cardiovascular Disease | Admitting: Cardiovascular Disease

## 2015-09-18 ENCOUNTER — Ambulatory Visit: Payer: Medicare Other | Admitting: Physician Assistant

## 2015-09-18 DIAGNOSIS — I739 Peripheral vascular disease, unspecified: Secondary | ICD-10-CM | POA: Diagnosis present

## 2015-09-21 ENCOUNTER — Telehealth: Payer: Self-pay | Admitting: Physician Assistant

## 2015-09-21 MED ORDER — INSULIN LISPRO 100 UNIT/ML (KWIKPEN): 5.0000 [IU] | PEN_INJECTOR | Freq: Three times a day (TID) | SUBCUTANEOUS | Status: DC

## 2015-09-21 NOTE — Telephone Encounter (Signed)
Medication refilled per protocol. 

## 2015-09-21 NOTE — Telephone Encounter (Signed)
Patient calling to get refill for his humalog pens to go to [facility]

## 2015-09-27 ENCOUNTER — Telehealth: Payer: Self-pay | Admitting: Physician Assistant

## 2015-09-27 MED ORDER — DABIGATRAN ETEXILATE MESYLATE 150 MG PO CAPS: 150.0000 mg | ORAL_CAPSULE | Freq: Two times a day (BID) | ORAL | Status: DC

## 2015-09-27 NOTE — Telephone Encounter (Signed)
Pt wife is calling for a year RX of Dradexa to be filled quarterly to [facility]. [name]: [phone]

## 2015-09-27 NOTE — Telephone Encounter (Signed)
Medication refilled per protocol. 

## 2015-10-02 ENCOUNTER — Ambulatory Visit (INDEPENDENT_AMBULATORY_CARE_PROVIDER_SITE_OTHER): Payer: Medicare Other | Admitting: Physician Assistant

## 2015-10-02 ENCOUNTER — Encounter: Payer: Self-pay | Admitting: Physician Assistant

## 2015-10-02 VITALS — BP 132/72 | HR 76 | Temp 98.2°F | Resp 18 | Wt 253.0 lb

## 2015-10-02 DIAGNOSIS — I350 Nonrheumatic aortic (valve) stenosis: Secondary | ICD-10-CM | POA: Diagnosis not present

## 2015-10-02 DIAGNOSIS — Z125 Encounter for screening for malignant neoplasm of prostate: Secondary | ICD-10-CM | POA: Insufficient documentation

## 2015-10-02 DIAGNOSIS — E785 Hyperlipidemia, unspecified: Secondary | ICD-10-CM

## 2015-10-02 DIAGNOSIS — G4733 Obstructive sleep apnea (adult) (pediatric): Secondary | ICD-10-CM

## 2015-10-02 DIAGNOSIS — I10 Essential (primary) hypertension: Secondary | ICD-10-CM

## 2015-10-02 DIAGNOSIS — E1165 Type 2 diabetes mellitus with hyperglycemia: Secondary | ICD-10-CM | POA: Diagnosis not present

## 2015-10-02 DIAGNOSIS — N189 Chronic kidney disease, unspecified: Secondary | ICD-10-CM

## 2015-10-02 DIAGNOSIS — G4730 Sleep apnea, unspecified: Secondary | ICD-10-CM

## 2015-10-02 DIAGNOSIS — E038 Other specified hypothyroidism: Secondary | ICD-10-CM | POA: Diagnosis not present

## 2015-10-02 DIAGNOSIS — E039 Hypothyroidism, unspecified: Secondary | ICD-10-CM

## 2015-10-02 DIAGNOSIS — E119 Type 2 diabetes mellitus without complications: Secondary | ICD-10-CM | POA: Diagnosis not present

## 2015-10-02 DIAGNOSIS — E669 Obesity, unspecified: Secondary | ICD-10-CM

## 2015-10-02 DIAGNOSIS — R7309 Other abnormal glucose: Secondary | ICD-10-CM | POA: Diagnosis not present

## 2015-10-02 DIAGNOSIS — I2510 Atherosclerotic heart disease of native coronary artery without angina pectoris: Secondary | ICD-10-CM | POA: Diagnosis not present

## 2015-10-02 DIAGNOSIS — E113399 Type 2 diabetes mellitus with moderate nonproliferative diabetic retinopathy without macular edema, unspecified eye: Secondary | ICD-10-CM

## 2015-10-02 LAB — COMPLETE METABOLIC PANEL WITH GFR
ALT: 23 U/L (ref 9–46)
AST: 23 U/L (ref 10–35)
Albumin: 4.1 g/dL (ref 3.6–5.1)
Alkaline Phosphatase: 27 U/L — ABNORMAL LOW (ref 40–115)
BUN: 19 mg/dL (ref 7–25)
CO2: 22 mmol/L (ref 20–31)
Calcium: 9.9 mg/dL (ref 8.6–10.3)
Chloride: 105 mmol/L (ref 98–110)
Creat: 1.26 mg/dL — ABNORMAL HIGH (ref 0.70–1.18)
GFR, Est African American: 66 mL/min (ref >=60)
GFR, Est Non African American: 57 mL/min — ABNORMAL LOW (ref >=60)
Glucose, Bld: 113 mg/dL — ABNORMAL HIGH (ref 70–99)
Potassium: 4.5 mmol/L (ref 3.5–5.3)
Sodium: 137 mmol/L (ref 135–146)
Total Bilirubin: 0.4 mg/dL (ref 0.2–1.2)
Total Protein: 6.5 g/dL (ref 6.1–8.1)

## 2015-10-02 LAB — LIPID PANEL
Cholesterol: 145 mg/dL (ref 125–200)
HDL: 31 mg/dL — ABNORMAL LOW (ref >=40)
LDL Cholesterol: 86 mg/dL (ref <130)
Total CHOL/HDL Ratio: 4.7 Ratio (ref <=5.0)
Triglycerides: 139 mg/dL (ref <150)
VLDL: 28 mg/dL (ref <30)

## 2015-10-02 LAB — HEMOGLOBIN A1C
Hgb A1c MFr Bld: 11.2 % — ABNORMAL HIGH (ref <5.7)
Mean Plasma Glucose: 275 mg/dL — ABNORMAL HIGH (ref <117)

## 2015-10-02 LAB — TSH: TSH: 5.475 u[IU]/mL — ABNORMAL HIGH (ref 0.350–4.500)

## 2015-10-02 LAB — T4, FREE: Free T4: 1.05 ng/dL (ref 0.80–1.80)

## 2015-10-02 NOTE — Progress Notes (Signed)
Patient ID: Steven Maldonado MRN: [id number], DOB: 19-Sep-1941, 74 y.o. Date of Encounter: @DATE @  Chief Complaint:  Chief Complaint  Patient presents with  . routine check up    is fasting    HPI: 74 y.o. year old white male  presents for routine follow up office visit.  He is quite a talker and continues to talk throughout the whole visit. He is retired from Architect work.  At [place] with me 01/10/14 he admitted that for the past couple months he had not been eating right.   Michela Pitcher that his brother died in 10/10/2013. Has had a lot of family deaths in the past 4 years. His wife has had multiple surgeries in the past year. Therefore he has had to spend quite a bit of time and energy helping care for her but also has been stressed/fatigue secondary to watching her in pain after one of the surgeries.  At visit 01/10/14 A1c came back at 11.8.  Prior to that he was on Lantus 55 units. At that time I stated to increase to 60 units. Today he says that he is still doing the 60 units at night in the 5 units 3 times a day at mealtime. He is still giving mealtime insulin 5 units 3 times a day.  At every visit---he does not bring a blood sugar log sheet  or his glucose meter or anything with the numbers documented. Again, this is the case today.  Says  that he checks his sugars every morning and every day before eating supper. He says that he can recall the recent readings. He says that his machine shows him a 30 day average and it has been 101. He says that morning readings have been good and that they ones before dinner arenalways a little bit higher but still have been good.  He says that his diet has been some better since his office visit in March. Says "it's much better--but not going to say it is perfect."  In the past, he had been walking for exercise but has not been able to do that recently----At [facility] it was because he has lower extremity claudication. He had lower extremity angiogram  and intervention 05/2014. However, today he says that he still is not doing any walking because he has been told not to--- he is seeing the wound center once every week secondary to a wound on his right heel.  Taking all other medications as directed with no adverse effects. No other complaints today. Discussed whether he feels that he is depressed and needs medication for this but he says that he does not.  His wife has finished her surgeries and is improving. Once the weather improves he can get back outside which will help. As well, he named off multiple happy/positive events that are planned for the upcoming year.-- graduations, weddings,  and a trip.  When we did labs at his office visit 09/26/14, all results came back good except A1c was elevated and I told him to start documenting blood sugars on a log sheet and bring into the next appointment. At office visit 12/28/14 he does bring in blood sugar log sheet. He has blood sugars documented for every single day --has fasting morning blood sugar readings and also blood sugar readings before dinner/supper. Fasting morning blood sugars are all in the 80s and 90s. Before dinner/supper--- all readings are in the 90s and low 100s to 110s. He says that his " diet is better"  and his  "numbers are better".  At visit 12/28/14 he also reports that he saw podiatry in November and again in January. However reports that [facility] says that podiatry cannot be the ones to complete their order that he has to get that done here. He brings in that form today. He says that his prior sore on his heel of his foot has completely healed. His last checkup they just checked his feet and trimmed his toenails.  At visit 12/28/14 he also reports problems with urinary urgency.  Says that he will all of a sudden have the sensation that he needs to urinate. He will go straight to the restroom but feels that he can barely get there in time. Has started  wearing a "diaper" at night because otherwise sometimes wets himself.  Does not have much nocturia. Says some nights he doesn't wake up at all to use the restroom and other nights it's only 1 or 2 times.  At [place] 05/01/15 he has no specific complaints or concerns. Says that he still getting good blood sugar readings. Mostly 90s to 100s. Occasionally 140, 150, 160. Today he does ask that I sign for handicap parking. Says in the past cardiology had been doing this for him. Says that his wife also has significant medical problems that prohibits her from being able to walk and needing handicap space as well.  At [place] 10/02/2015:  He states that his granddaughter is in the process of trying to get a kidney transplant. He and his wife have been helping take care of her children and also being with her. Asked what caused this kidney failure and he says that she was prescribed medications that cause this. He says that they've been very busy with that but has no other specific complaints or concerns today. He did not bring his blood sugar log sheet with him today. He says that he recently had follow-up with his cardiologist and that that evaluation was good and he also had follow-up on his peripheral arterial disease and nose test were good.    Past Medical History  Diagnosis Date  . Atrial fibrillation ([place])     patient is on pradaxa  . Other and unspecified hyperlipidemia   . Unspecified essential hypertension   . Coronary artery disease     Cath February 2012 in [facility], occluded RCA with collaterals  . Aortic stenosis     Moderate by echo 8/12  . High cholesterol   . Heart disease   . CAD (coronary artery disease)   . Chronic kidney disease 08/30/2013  . 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 Heart murmur   . DM type 2 (diabetes mellitus, type 2) ([place])   . Cataract associated with type 2 diabetes mellitus ([place]) 10/06/2013  . Obstructive sleep apnea     2012  . Sleep apnea with use of  continuous positive airway pressure (CPAP)     04-11-11 AHI was 32.9 and titrated to 15 cm H20, DME is [facility]  . Skin cancer     "cut off back of neck X 2; off left upper arm; right wrist, near right shoulder blade; several burned off other areas of my body" (06/08/2014)  . NSTEMI (non-ST elevated myocardial infarction) ([place]) 12/2010  . Diabetic ulcer of heel ([place]) 07/14/2014     Home Meds:  Outpatient Prescriptions Prior to Visit  Medication Sig Dispense Refill  . aspirin 81 MG tablet Take 81 mg by mouth daily.    [name] Kitchen  atorvastatin (LIPITOR) 80 MG tablet Take 1 tablet (80 mg total) by mouth at bedtime. 90 tablet 3  . Blood Glucose Monitoring Suppl (FREESTYLE LITE) DEVI 1 each by Does not apply route 2 (two) times daily. 1 each 0  . Choline Fenofibrate (TRILIPIX) 135 MG capsule Take 1 capsule (135 mg total) by mouth daily. 90 capsule 3  . dabigatran (PRADAXA) 150 MG CAPS capsule Take 1 capsule (150 mg total) by mouth every 12 (twelve) hours. 180 capsule 0  . docusate sodium (COLACE) 100 MG capsule Take 100 mg by mouth daily as needed for mild constipation.    . furosemide (LASIX) 20 MG tablet Take 1 tablet (20 mg total) by mouth daily. 90 tablet 3  . glucose blood (FREESTYLE LITE) test strip 1 each by Other route 4 (four) times daily -  before meals and at bedtime. 150 each 11  . insulin glargine (LANTUS) 100 UNIT/ML injection Inject 0.6 mLs (60 Units total) into the skin daily. 60 mL 3  . insulin lispro (HUMALOG) 100 UNIT/ML KiwkPen Inject 0.05 mLs (5 Units total) into the skin 3 (three) times daily. 15 mL 3  . Insulin Pen Needle 32G X 4 MM MISC 1 each by Does not apply route 3 (three) times daily before meals. 100 each 11  . Insulin Syringe-Needle U-100 (BD INSULIN SYRINGE ULTRAFINE) 31G X 5/16" 1 ML MISC 1 Syringe by Does not apply route daily. 100 each 3  . Insulin Syringe-Needle U-100 (BD INSULIN SYRINGE ULTRAFINE) 31G X 5/16" 1 ML MISC 1 Syringe by Does not apply route daily. 100 each 3  .  lisinopril (PRINIVIL,ZESTRIL) 10 MG tablet Take 1 tablet (10 mg total) by mouth daily. 90 tablet 3  . metFORMIN (GLUCOPHAGE) 1000 MG tablet Take 1 tablet (1,000 mg total) by mouth 2 (two) times daily with a meal. 180 tablet 1  . metoprolol (LOPRESSOR) 50 MG tablet Take 1 tablet (50 mg total) by mouth 2 (two) times daily. 180 tablet 3  . MYRBETRIQ 25 MG TB24 tablet TAKE 1 TABLET (25 MG TOTAL) BY MOUTH DAILY. 30 tablet 5  . TOVIAZ 4 MG TB24 tablet TAKE 1 TABLET (4 MG TOTAL) BY MOUTH DAILY.  5   No facility-administered medications prior to visit.     Allergies: No Known Allergies  Social History   Social History  . Marital Status: Married    Spouse Name: [name]  . Number of Children: 1  . Years of Education: 12   Occupational History  . retired    Social History Main Topics  . Smoking status: Former Smoker -- 2.00 packs/day for 14 years    Types: Cigarettes    Quit date: 11/11/1972  . Smokeless tobacco: Never Used  . Alcohol Use: No     Comment: quit in 1984  . Drug Use: No  . Sexual Activity: Not Currently   Other Topics Concern  . 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   Patient has one child and his wife has one child.   Patient is retired.   Patient has a high school education.   Patient is right-handed.   Patient drinks very little caffeine.    Family History  Problem Relation Age of Onset  . Diabetes Mother   . Heart attack Mother   . Heart attack Father   . Heart failure Father   . Diabetes Other   . Diabetes Sister   .  Diabetes Brother   . Diabetes Daughter     TYPE ll  . Heart Problems Daughter      Review of Systems:  See HPI for pertinent ROS. All other ROS negative.    Physical Exam: Blood pressure 132/72, pulse 76, temperature 98.2 F (36.8 C), temperature source Oral, resp. rate 18, weight 253 lb (114.76 kg)., Body mass index is 35.3 kg/(m^2). General: Moderately obese  (Abdominal Obesity) white male .Appears in no acute distress. Neck: Supple. No thyromegaly. No lymphadenopathy. Radiation of murmur vs carotid bruit--heard bilaterally. Lungs: Clear bilaterally to auscultation without wheezes, rales, or rhonchi. Breathing is unlabored. Heart: Irreg III-IV/VI murmur heard throughout precordium. Abdomen:Obese.  Soft, non-tender, non-distended with normoactive bowel sounds. No hepatomegaly. No rebound/guarding. No obvious abdominal masses. Musculoskeletal:  Strength and tone normal for age. Extremities/Skin: Warm and dry.. No edema.  Neuro: Alert and oriented X 3. Moves all extremities spontaneously. Gait is normal. CNII-XII grossly in tact. Psych:  Responds to questions appropriately with a normal affect. Diabetic foot exam:  Inspection is normal.  No open wounds. No calluses.  He does have diffuse dry skin on the plantar surface of his feet bilaterally.  No palpable dorsalis pedis or posterior tibial pulse bilaterally.      ASSESSMENT AND PLAN:  74 y.o. year old male with  1. CAD Sees cardiology on a routine basis. Managed by cardiology.  2. PAD (peripheral artery disease) Regarding his peripheral arterial disease--- the following is copied from his cardiovascular doctor's (Dr. Fletcher Anon) note 11/29/14. "He was seen in 2015 for worsening severe right calf claudication with skin breakdown on the right heel that was slow to heal. Dr. Fletcher Anon proceeded with angiography 06/08/2014 which showed: 1-no significant aortoiliac disease 2-chronically occluded left popliteal artery with two-vessel runoff below the knee with reasonable collaterals. 3-occluded right popliteal artery with poor collaterals. Two-vessel runoff below the knee with an occluded posterior tibial.  He performed successful angioplasty and drug coated balloon angioplasty of the right popliteal artery. At follow-up patient had resolution of claudication and ulcer healed completely.  However, at  further follow-up--his note dated 11/29/14 states that there is evidence of restenosis in the right popliteal artery. However, patient was not having any claudication and also had healed completely. Therefore, Dr. Fletcher Anon recommended monitoring for now and repeat duplex in 3 months. Noted that the area was heavily calcified on angiography.   3. Aortic stenosis This is managed by Cardiology. Sees Cardiology routinely, has Echo routinely  4. A. Fib-- Managed by Cardiology  5. DM type 2 (diabetes mellitus, type 2)  At prior OV: "Excellent job with documenting blood sugar log.                      "As well think this is giving him motivation to improve his diet and therefore get better blood sugar control.  - Hemoglobin A1c  MicroAlbumin 09/26/2014--Repeat 10/02/2015  He is currently on Lantus 60 units.  Also uses Humalog 5 units 3 times a day with meals.  He is now on ACE Inhibitor. I added this at 01/10/2014 OV.  He is on statin. He is on aspirin 81 mg daily  Does have routine ophthalmic exam. He recently got a report from Ophthalmologist-  Which states--3 #4 and #5 and #6 below   Diabetic Shoes--- I completed form for this at his visit 12/28/14. THIS FORM WAS COPIED AND SENT TO SCAN INTO EPIC. FORM ALSO FAXED IN TO [facility] [employer] Regarding his peripheral  arterial disease--- the following is copied from his cardiovascular doctor's (Dr. Fletcher Anon) note 11/29/14. "He was seen in 2015 for worsening severe right calf claudication with skin breakdown on the right heel that was slow to heal. Dr. Fletcher Anon proceeded with angiography 06/08/2014 which showed: 1-no significant aortoiliac disease 2-chronically occluded left popliteal artery with two-vessel runoff below the knee with reasonable collaterals. 3-occluded right popliteal artery with poor collaterals. Two-vessel runoff below the knee with an occluded posterior tibial.  He performed successful angioplasty and drug coated balloon  angioplasty of the right popliteal artery. At follow-up patient had resolution of claudication and ulcer healed completely.  However, at further follow-up--his note dated 11/29/14 states that there is evidence of restenosis in the right popliteal artery. However, patient was not having any claudication and also had healed completely. Therefore, Dr. Fletcher Anon recommended monitoring for now and repeat duplex in 3 months. Noted that the area was heavily calcified on angiography.  6. Type II or unspecified type diabetes mellitus with ophthalmic manifestations, not stated as uncontrolled Per Ophthalmology 7. 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 Cataract associated with type 2 diabetes mellitus Per ophthalmology  9. Chronic kidney disease --On ACE inhibitor. Keeping blood pressure controlled at goal. Keeping sugar controlled as best as possible.  10. HYPERTENSION, BENIGN Blood Pressure at goal. On ACE inhibitor. Recheck BMET to monitor meds.  11. HYPERLIPIDEMIA-MIXED On Lipitor 80. Had FLP/LFT 09/2014. Repeat 09/2015  12. Obstructive sleep apnea He wears CPAP. Managed by Dr. Rolm Baptise  13. Sleep apnea with use of continuous positive airway pressure (CPAP) See # 10  14. Obesity (BMI 30-39.9) See history of present illness  15. Subclinical hypothyroidism - TSH, Free T4 last checked 09/2014, 05/01/2015, 10/02/2015   16. Urge incontinence On Toviaz.Controlled. Stable. Continue current treatment.   17. Screening PSA --Last checked 09/26/14. 10/02/2015   18. Colonoscopy: He had colonoscopy 06/16/2009  19. Immunizations: Pneumonia vaccine:  Pneumovax 23--was given 01/01/2011   Prevnar 13 ------Given here 06/02/2014 Tetanus vaccine: ---Given here 06/02/2014 Influenza Vaccine--he did receive fall 2015, Fall 2016    Regular office visit 3 months or sooner if needed.  [id number] [place], [place], [facility] 10/02/2015 12:48 PM

## 2015-10-03 LAB — MICROALBUMIN, URINE: Microalb, Ur: 2.5 mg/dL

## 2015-10-03 LAB — PSA, MEDICARE: PSA: 2.32 ng/mL (ref <=4.00)

## 2015-10-10 ENCOUNTER — Other Ambulatory Visit: Payer: Self-pay | Admitting: Physician Assistant

## 2015-10-10 NOTE — Telephone Encounter (Signed)
Pen needles refilled 

## 2015-10-25 ENCOUNTER — Other Ambulatory Visit: Payer: Self-pay | Admitting: Physician Assistant

## 2015-10-25 NOTE — Telephone Encounter (Signed)
Medication refilled per protocol. 

## 2015-10-26 ENCOUNTER — Encounter: Payer: Self-pay | Admitting: Family Medicine

## 2015-10-31 ENCOUNTER — Telehealth: Payer: Self-pay | Admitting: Cardiovascular Disease

## 2015-10-31 NOTE — Telephone Encounter (Signed)
New message  Patient c/o Afib:  High priority if patient c/o lightheadedness and shortness of breath.  1. How long have you been in Afib? Symptoms on 10/30/2015 around 8pm per pt wife, noticed he was having a hard time breathing.   2. Are you currently experiencing lightheadedness and shortness of breath? No.  Per wife the symptoms were last night. Pt states that he felt as if he was in AFIB  3. Have you checked your BP and heart rate? (document readings) No   4. Are you experiencing any other symptoms? No   Please call patient back to discuss.

## 2015-10-31 NOTE — Telephone Encounter (Signed)
Spoke with pt.  He reports shortness of breath for last several days to last week. This occurs with exertion such as walking. A couple of weeks ago he was able to walk around stores without problems and for last week unable to walk around grocery store without stopping to rest.  States the last time this happened he was diagnosed with atrial fib.  He is not having any chest pain. Denies any palpitations.  No swelling.  Weight gain of 7 lbs in last month.  Reports he has not been eating as well.  He thinks he needs appt to see if in atrial fib again. Will review with provider in office.

## 2015-10-31 NOTE — Telephone Encounter (Signed)
Reviewed with Dr. Meda Coffee (office DOD) and pt needs office visit for evaluation of symptoms.  Appt made for pt to see Melina Copa, PA on 11/02/15 at 9:00.  Pt instructed to go to ED if symptoms worsen prior to this appt.

## 2015-11-01 ENCOUNTER — Encounter: Payer: Self-pay | Admitting: Physician Assistant
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
## 2015-11-02 ENCOUNTER — Telehealth: Payer: Self-pay

## 2015-11-02 ENCOUNTER — Ambulatory Visit (INDEPENDENT_AMBULATORY_CARE_PROVIDER_SITE_OTHER): Payer: Medicare Other | Admitting: Physician Assistant

## 2015-11-02 ENCOUNTER — Encounter: Payer: Self-pay | Admitting: Physician Assistant

## 2015-11-02 ENCOUNTER — Other Ambulatory Visit: Payer: Self-pay | Admitting: Physician Assistant

## 2015-11-02 VITALS — BP 126/72 | HR 109 | Ht 71.0 in | Wt 260.6 lb

## 2015-11-02 DIAGNOSIS — I10 Essential (primary) hypertension: Secondary | ICD-10-CM | POA: Diagnosis not present

## 2015-11-02 DIAGNOSIS — Z79899 Other long term (current) drug therapy: Secondary | ICD-10-CM

## 2015-11-02 DIAGNOSIS — R0602 Shortness of breath: Secondary | ICD-10-CM | POA: Diagnosis not present

## 2015-11-02 DIAGNOSIS — I2510 Atherosclerotic heart disease of native coronary artery without angina pectoris: Secondary | ICD-10-CM

## 2015-11-02 DIAGNOSIS — I350 Nonrheumatic aortic (valve) stenosis: Secondary | ICD-10-CM

## 2015-11-02 DIAGNOSIS — R790 Abnormal level of blood mineral: Secondary | ICD-10-CM

## 2015-11-02 DIAGNOSIS — I4891 Unspecified atrial fibrillation: Secondary | ICD-10-CM | POA: Diagnosis not present

## 2015-11-02 DIAGNOSIS — I480 Paroxysmal atrial fibrillation: Secondary | ICD-10-CM

## 2015-11-02 DIAGNOSIS — R799 Abnormal finding of blood chemistry, unspecified: Secondary | ICD-10-CM

## 2015-11-02 LAB — TSH: TSH: 6.927 u[IU]/mL — ABNORMAL HIGH (ref 0.350–4.500)

## 2015-11-02 LAB — MAGNESIUM: Magnesium: 1.5 mg/dL (ref 1.5–2.5)

## 2015-11-02 LAB — BASIC METABOLIC PANEL
BUN: 27 mg/dL — ABNORMAL HIGH (ref 7–25)
CO2: 22 mmol/L (ref 20–31)
Calcium: 9.9 mg/dL (ref 8.6–10.3)
Chloride: 105 mmol/L (ref 98–110)
Creat: 1.62 mg/dL — ABNORMAL HIGH (ref 0.70–1.18)
Glucose, Bld: 136 mg/dL — ABNORMAL HIGH (ref 65–99)
Potassium: 4.7 mmol/L (ref 3.5–5.3)
Sodium: 137 mmol/L (ref 135–146)

## 2015-11-02 LAB — CBC WITH DIFFERENTIAL/PLATELET
Basophils Absolute: 0.1 10*3/uL (ref 0.0–0.1)
Basophils Relative: 1 % (ref 0–1)
Eosinophils Absolute: 0.2 10*3/uL (ref 0.0–0.7)
Eosinophils Relative: 4 % (ref 0–5)
HCT: 34.6 % — ABNORMAL LOW (ref 39.0–52.0)
Hemoglobin: 11.4 g/dL — ABNORMAL LOW (ref 13.0–17.0)
Lymphocytes Relative: 34 % (ref 12–46)
Lymphs Abs: 2.1 10*3/uL (ref 0.7–4.0)
MCH: 30.9 pg (ref 26.0–34.0)
MCHC: 32.9 g/dL (ref 30.0–36.0)
MCV: 93.8 fL (ref 78.0–100.0)
MPV: 11.1 fL (ref 8.6–12.4)
Monocytes Absolute: 0.7 10*3/uL (ref 0.1–1.0)
Monocytes Relative: 11 % (ref 3–12)
Neutro Abs: 3.1 10*3/uL (ref 1.7–7.7)
Neutrophils Relative %: 50 % (ref 43–77)
Platelets: 218 10*3/uL (ref 150–400)
RBC: 3.69 MIL/uL — ABNORMAL LOW (ref 4.22–5.81)
RDW: 13.4 % (ref 11.5–15.5)
WBC: 6.1 10*3/uL (ref 4.0–10.5)

## 2015-11-02 LAB — T4, FREE: Free T4: 1.02 ng/dL (ref 0.80–1.80)

## 2015-11-02 MED ORDER — MAGNESIUM OXIDE 400 MG PO TABS: 400.0000 mg | ORAL_TABLET | Freq: Every day | ORAL | Status: DC

## 2015-11-02 NOTE — Patient Instructions (Signed)
Medication Instructions:  Your physician recommends that you continue on your current medications as directed. Please refer to the Current Medication list given to you today.   Labwork: Bmet, Mg, Cbc, Tsh, Free T4 today  Testing/Procedures: Your physician has recommended that you have a Cardioversion (DCCV). Electrical Cardioversion uses a jolt of electricity to your heart either through paddles or wired patches attached to your chest. This is a controlled, usually prescheduled, procedure. Defibrillation is done under light anesthesia in the hospital, and you usually go home the day of the procedure. This is done to get your heart back into a normal rhythm. You are not awake for the procedure. Please see the instruction sheet given to you today.  Your physician has requested that you have an echocardiogram. Echocardiography is a painless test that uses sound waves to create images of your heart. It provides your doctor with information about the size and shape of your heart and how well your heart's chambers and valves are working. This procedure takes approximately one hour. There are no restrictions for this procedure.    Follow-Up: Your physician recommends that you schedule a follow-up appointment in: 2 weeks with Melina Copa, PA   Any Other Special Instructions Will Be Listed Below (If Applicable).     If you need a refill on your cardiac medications before your next appointment, please call your pharmacy.

## 2015-11-02 NOTE — Telephone Encounter (Signed)
Put in order for MagOx 400 mg daily. BMET and Mg ordered and scheduled for 12/28. Patient will hold his Lasix and will follow-up with PCP.  Notes Recorded by Charlie Pitter, [name] on 11/02/2015 at 4:20 PM Please call patient.  BUN/Cr are elevated as compared to before, suspect related to his atrial fib. Please hold Lasix for now. Keep plans for cardioversion tomorrow. Magnesium is also borderline low. Start MagOx 400mg  daily tonight and increase dietary intake of healthy sources of magnesium including leafy greens, nuts, seeds, fish, beans, whole grains, avocados, yogurt, and bananas. Recheck BMET and Mg on Tuesday 12/27.  He needs to discuss 2 things with PCP: 1) Hgb is mildly decreased from before, and will need to be followed since he is on blood thinner. 2) His TSH remains high. He should discuss treatment of subclinical hypothyroidism with his PCP as this could be contributing to his atrial fibrillation.

## 2015-11-03 ENCOUNTER — Ambulatory Visit (HOSPITAL_COMMUNITY)
Admission: RE | Admit: 2015-11-03 | Discharge: 2015-11-03 | Disposition: A | Discharge status: Home / Self Care | Payer: Medicare Other | Source: Ambulatory Visit | Attending: Cardiology | Admitting: Cardiology

## 2015-11-03 ENCOUNTER — Encounter (HOSPITAL_COMMUNITY)
Admission: RE | Disposition: A | Discharge status: Home / Self Care | Payer: Self-pay | Source: Ambulatory Visit | Attending: Cardiology

## 2015-11-03 ENCOUNTER — Ambulatory Visit (HOSPITAL_COMMUNITY): Payer: Medicare Other | Admitting: Anesthesiology

## 2015-11-03 ENCOUNTER — Encounter (HOSPITAL_COMMUNITY): Payer: Self-pay

## 2015-11-03 DIAGNOSIS — E785 Hyperlipidemia, unspecified: Secondary | ICD-10-CM | POA: Diagnosis not present

## 2015-11-03 DIAGNOSIS — E1122 Type 2 diabetes mellitus with diabetic chronic kidney disease: Secondary | ICD-10-CM | POA: Insufficient documentation

## 2015-11-03 DIAGNOSIS — G4733 Obstructive sleep apnea (adult) (pediatric): Secondary | ICD-10-CM | POA: Insufficient documentation

## 2015-11-03 DIAGNOSIS — Z6836 Body mass index (BMI) 36.0-36.9, adult: Secondary | ICD-10-CM | POA: Diagnosis not present

## 2015-11-03 DIAGNOSIS — Z794 Long term (current) use of insulin: Secondary | ICD-10-CM | POA: Diagnosis not present

## 2015-11-03 DIAGNOSIS — Z7982 Long term (current) use of aspirin: Secondary | ICD-10-CM | POA: Insufficient documentation

## 2015-11-03 DIAGNOSIS — I480 Paroxysmal atrial fibrillation: Secondary | ICD-10-CM | POA: Insufficient documentation

## 2015-11-03 DIAGNOSIS — I252 Old myocardial infarction: Secondary | ICD-10-CM | POA: Diagnosis not present

## 2015-11-03 DIAGNOSIS — N183 Chronic kidney disease, stage 3 (moderate): Secondary | ICD-10-CM | POA: Insufficient documentation

## 2015-11-03 DIAGNOSIS — I2510 Atherosclerotic heart disease of native coronary artery without angina pectoris: Secondary | ICD-10-CM | POA: Diagnosis not present

## 2015-11-03 DIAGNOSIS — I129 Hypertensive chronic kidney disease with stage 1 through stage 4 chronic kidney disease, or unspecified chronic kidney disease: Secondary | ICD-10-CM | POA: Diagnosis not present

## 2015-11-03 DIAGNOSIS — E1165 Type 2 diabetes mellitus with hyperglycemia: Secondary | ICD-10-CM | POA: Diagnosis not present

## 2015-11-03 DIAGNOSIS — E669 Obesity, unspecified: Secondary | ICD-10-CM | POA: Insufficient documentation

## 2015-11-03 DIAGNOSIS — Z87891 Personal history of nicotine dependence: Secondary | ICD-10-CM | POA: Insufficient documentation

## 2015-11-03 DIAGNOSIS — Z79899 Other long term (current) drug therapy: Secondary | ICD-10-CM | POA: Diagnosis not present

## 2015-11-03 DIAGNOSIS — E039 Hypothyroidism, unspecified: Secondary | ICD-10-CM | POA: Diagnosis not present

## 2015-11-03 DIAGNOSIS — Z7901 Long term (current) use of anticoagulants: Secondary | ICD-10-CM | POA: Insufficient documentation

## 2015-11-03 DIAGNOSIS — E1136 Type 2 diabetes mellitus with diabetic cataract: Secondary | ICD-10-CM | POA: Insufficient documentation

## 2015-11-03 DIAGNOSIS — E1151 Type 2 diabetes mellitus with diabetic peripheral angiopathy without gangrene: Secondary | ICD-10-CM | POA: Insufficient documentation

## 2015-11-03 DIAGNOSIS — I4891 Unspecified atrial fibrillation: Secondary | ICD-10-CM | POA: Diagnosis not present

## 2015-11-03 HISTORY — PX: CARDIOVERSION: SHX1299

## 2015-11-03 LAB — GLUCOSE, CAPILLARY: Glucose-Capillary: 128 mg/dL — ABNORMAL HIGH (ref 65–99)

## 2015-11-03 SURGERY — CARDIOVERSION
Anesthesia: Monitor Anesthesia Care

## 2015-11-03 MED ORDER — PROPOFOL 10 MG/ML IV BOLUS
INTRAVENOUS | Status: DC | PRN
  Administered 2015-11-03: 30 mg via INTRAVENOUS
  Administered 2015-11-03: 40 mg via INTRAVENOUS

## 2015-11-03 MED ORDER — SODIUM CHLORIDE 0.9 % IV SOLN: INTRAVENOUS | Status: DC

## 2015-11-03 NOTE — Transfer of Care (Signed)
Immediate Anesthesia Transfer of Care Note  Patient: Steven Maldonado  Procedure(s) Performed: Procedure(s): CARDIOVERSION (N/A)  Patient Location: Endoscopy Unit  Anesthesia Type:General  Level of Consciousness: awake, alert  and oriented  Airway & Oxygen Therapy: Patient Spontanous Breathing  Post-op Assessment: Report given to RN and Post -op Vital signs reviewed and stable  Post vital signs: Reviewed and stable  Last Vitals:  Filed Vitals:   11/03/15 1116 11/03/15 1226  BP: 132/93 105/61  Pulse: 88 66  Resp: 17 16    Complications: No apparent anesthesia complications

## 2015-11-03 NOTE — Discharge Instructions (Signed)
Electrical Cardioversion, Care After °Refer to this sheet in the next few weeks. These instructions provide you with information on caring for yourself after your procedure. Your health care provider may also give you more specific instructions. Your treatment has been planned according to current medical practices, but problems sometimes occur. Call your health care provider if you have any problems or questions after your procedure. °WHAT TO EXPECT AFTER THE PROCEDURE °After your procedure, it is typical to have the following sensations: °· Some redness on the skin where the shocks were delivered. If this is tender, a sunburn lotion or hydrocortisone cream may help. °· Possible return of an abnormal heart rhythm within hours or days after the procedure. °HOME CARE INSTRUCTIONS °· Take medicines only as directed by your health care provider. Be sure you understand how and when to take your medicine. °· Learn how to feel your pulse and check it often. °· Limit your activity for 48 hours after the procedure or as directed by your health care provider. °· Avoid or minimize caffeine and other stimulants as directed by your health care provider. °SEEK MEDICAL CARE IF: °· You feel like your heart is beating too fast or your pulse is not regular. °· You have any questions about your medicines. °· You have bleeding that will not stop. °SEEK IMMEDIATE MEDICAL CARE IF: °· You are dizzy or feel faint. °· It is hard to breathe or you feel short of breath. °· There is a change in discomfort in your chest. °· Your speech is slurred or you have trouble moving an arm or leg on one side of your body. °· You get a serious muscle cramp that does not go away. °· Your fingers or toes turn cold or blue. °  °This information is not intended to replace advice given to you by your health care provider. Make sure you discuss any questions you have with your health care provider. °  °Document Released: 08/18/2013 Document Revised: 11/18/2014  Document Reviewed: 08/18/2013 °[employer]vier Interactive Patient Education ©2016 [employer] ° °
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
## 2015-11-03 NOTE — Procedures (Signed)
Electrical Cardioversion Procedure Note Steven Maldonado [id number] [birth year]  Procedure: Electrical Cardioversion Indications:  Atrial Fibrillation  Procedure Details Consent: Risks of procedure as well as the alternatives and risks of each were explained to the (patient/caregiver).  Consent for procedure obtained. Time Out: Verified patient identification, verified procedure, site/side was marked, verified correct patient position, special equipment/implants available, medications/allergies/relevent history reviewed, required imaging and test results available.  Performed  Patient placed on cardiac monitor, pulse oximetry, supplemental oxygen as necessary.  Sedation given: Patient sedated by anesthesia with and diprovan 70 mg IV. Pacer pads placed anterior and posterior chest.  Cardioverted 2 time(s).  Cardioverted at 120J unsuccessfully; second attempt with 150 J resulted in sinus rhythm.  Evaluation Findings: Post procedure EKG shows: NSR Complications: None Patient did tolerate procedure well.   Steven Maldonado 11/03/2015, 11:58 AM

## 2015-11-03 NOTE — Anesthesia Preprocedure Evaluation (Addendum)
Anesthesia Evaluation  Patient identified by MRN, date of birth, ID band Patient awake    Reviewed: Allergy & Precautions, H&P , NPO status , Patient's Chart, lab work & pertinent test results  History of Anesthesia Complications Negative for: history of anesthetic complications  Airway Mallampati: III  TM Distance: >3 FB Neck ROM: Full    Dental  (+) Upper Dentures, Lower Dentures   Pulmonary sleep apnea and Continuous Positive Airway Pressure Ventilation , neg COPD, former smoker,    breath sounds clear to auscultation       Cardiovascular Exercise Tolerance: Poor hypertension, Pt. on medications (-) angina+ CAD, + Past MI, + Peripheral Vascular Disease and + DOE  (-) CHF + dysrhythmias Atrial Fibrillation + Valvular Problems/Murmurs AS  Rhythm:Irregular Rate:Normal + Systolic murmurs    Neuro/Psych negative neurological ROS  negative psych ROS   GI/Hepatic Neg liver ROS, indigestion occasionally   Endo/Other  diabetes, Poorly Controlled, Type 2, Insulin DependentHypothyroidism Obesity   Renal/GU Renal InsufficiencyRenal disease     Musculoskeletal   Abdominal   Peds  Hematology On pradaxa   Anesthesia Other Findings   Reproductive/Obstetrics                           Anesthesia Physical Anesthesia Plan  ASA: III  Anesthesia Plan: MAC   Post-op Pain Management:    Induction: Intravenous  Airway Management Planned: Mask  Additional Equipment: None  Intra-op Plan:   Post-operative Plan:   Informed Consent: I have reviewed the patients History and Physical, chart, labs and discussed the procedure including the risks, benefits and alternatives for the proposed anesthesia with the patient or authorized representative who has indicated his/her understanding and acceptance.     Plan Discussed with: CRNA and Surgeon  Anesthesia Plan Comments:         Anesthesia Quick  Evaluation

## 2015-11-03 NOTE — Anesthesia Postprocedure Evaluation (Signed)
Anesthesia Post Note  Patient: Steven Maldonado  Procedure(s) Performed: Procedure(s) (LRB): CARDIOVERSION (N/A)  Patient location during evaluation: PACU Anesthesia Type: MAC Level of consciousness: awake and alert Pain management: pain level controlled Vital Signs Assessment: post-procedure vital signs reviewed and stable Respiratory status: spontaneous breathing, nonlabored ventilation, respiratory function stable and patient connected to nasal cannula oxygen Cardiovascular status: stable and blood pressure returned to baseline Anesthetic complications: no    Last Vitals:  Filed Vitals:   11/03/15 1250 11/03/15 1253  BP: 111/57 109/78  Pulse: 66 67  Temp:    Resp: 20 18    Last Pain: There were no vitals filed for this visit.               Catalina Gravel

## 2015-11-06 ENCOUNTER — Encounter (HOSPITAL_COMMUNITY): Payer: Self-pay | Admitting: Cardiology

## 2015-11-07 ENCOUNTER — Other Ambulatory Visit: Payer: Self-pay | Admitting: Physician Assistant

## 2015-11-07 NOTE — Telephone Encounter (Signed)
Medication refilled per protocol. 

## 2015-11-08 ENCOUNTER — Other Ambulatory Visit (INDEPENDENT_AMBULATORY_CARE_PROVIDER_SITE_OTHER): Payer: Medicare Other

## 2015-11-08 DIAGNOSIS — Z79899 Other long term (current) drug therapy: Secondary | ICD-10-CM | POA: Diagnosis not present

## 2015-11-08 DIAGNOSIS — R799 Abnormal finding of blood chemistry, unspecified: Secondary | ICD-10-CM

## 2015-11-08 DIAGNOSIS — R790 Abnormal level of blood mineral: Secondary | ICD-10-CM

## 2015-11-08 LAB — BASIC METABOLIC PANEL
BUN: 21 mg/dL (ref 7–25)
CO2: 18 mmol/L — ABNORMAL LOW (ref 20–31)
Calcium: 9.5 mg/dL (ref 8.6–10.3)
Chloride: 106 mmol/L (ref 98–110)
Creat: 1.54 mg/dL — ABNORMAL HIGH (ref 0.70–1.18)
Glucose, Bld: 132 mg/dL — ABNORMAL HIGH (ref 65–99)
Potassium: 5.9 mmol/L — ABNORMAL HIGH (ref 3.5–5.3)
Sodium: 137 mmol/L (ref 135–146)

## 2015-11-08 LAB — MAGNESIUM: Magnesium: 2.1 mg/dL (ref 1.5–2.5)

## 2015-11-09 ENCOUNTER — Telehealth: Payer: Self-pay | Admitting: Physician Assistant

## 2015-11-09 ENCOUNTER — Other Ambulatory Visit: Payer: Medicare Other

## 2015-11-09 ENCOUNTER — Other Ambulatory Visit (INDEPENDENT_AMBULATORY_CARE_PROVIDER_SITE_OTHER): Payer: Medicare Other | Admitting: *Deleted

## 2015-11-09 ENCOUNTER — Telehealth: Payer: Self-pay | Admitting: Cardiovascular Disease

## 2015-11-09 DIAGNOSIS — E875 Hyperkalemia: Secondary | ICD-10-CM

## 2015-11-09 LAB — BASIC METABOLIC PANEL
Anion gap: 9 (ref 5–15)
BUN: 16 mg/dL (ref 6–20)
CO2: 25 mmol/L (ref 22–32)
Calcium: 10 mg/dL (ref 8.9–10.3)
Chloride: 106 mmol/L (ref 101–111)
Creatinine, Ser: 1.66 mg/dL — ABNORMAL HIGH (ref 0.61–1.24)
GFR calc Af Amer: 46 mL/min — ABNORMAL LOW (ref >60)
GFR calc non Af Amer: 40 mL/min — ABNORMAL LOW (ref >60)
Glucose, Bld: 238 mg/dL — ABNORMAL HIGH (ref 65–99)
Potassium: 5.6 mmol/L — ABNORMAL HIGH (ref 3.5–5.1)
Sodium: 140 mmol/L (ref 135–145)

## 2015-11-09 NOTE — Addendum Note (Signed)
Addended by: Domenica Reamer R on: 11/09/2015 04:20 PM   Modules accepted: Orders

## 2015-11-09 NOTE — Telephone Encounter (Signed)
New message ° ° °Patient returning call back to nurse.  °

## 2015-11-09 NOTE — Telephone Encounter (Signed)
I had spoken earlier with Theda Sers RN who had entered a STAT BMET on this patient earlier due to elevated potassium. I had signed out to the PA on call this evening to be aware when she receives that call about his lab results. She informed me this evening she had never heard back. I logged into Epic. I had a second order under [employer] for a cosign on another BMET for this same patient. However, it does not look like either of these came back to my Inbasket. I went to Labs to review and it appears somehow they were under Dr. Victorino December name (?). K remains elevated at 5.6. Difficult to know if this is real, traditionally his K has not been elevated. I called patient at home. He is feeling fine. He confirms he is not on any potassium supplements, multivitamins or using salt substitutes containing potassium. I advised he increase hydration and hold lisinopril until further notice. He does affirm he is back on Lasix because he thought he was only supposed to hold this for one day. I will discuss timing of f/u with DOD in AM. Melina Copa [name]

## 2015-11-09 NOTE — Telephone Encounter (Signed)
Patient called back. Informed him of Dayna's message. He will come in this afternoon for blood work.

## 2015-11-09 NOTE — Telephone Encounter (Signed)
Left message for patient to call back. Per Dayna Dunn's result note on recent lab, Please get repeat BMET stat today. Potassium was elevated but specimen was hemolyzed. Will inform patient when he returns our call.

## 2015-11-10 ENCOUNTER — Telehealth: Payer: Self-pay | Admitting: *Deleted

## 2015-11-10 DIAGNOSIS — I2510 Atherosclerotic heart disease of native coronary artery without angina pectoris: Secondary | ICD-10-CM

## 2015-11-10 NOTE — Telephone Encounter (Signed)
Called pt, per Melina Copa, [name], to advise him to continue to hold his Lisinopril until further notice and to come in early next week for a repeat BMET and BP check.  Pt already scheduled to see Melina Copa, [name] 11/15/15 and we will recheck BMET and BP at this appt.  Pt verbalized understanding. Order for BMET has been put into EPIC.

## 2015-11-10 NOTE — Telephone Encounter (Signed)
Discussed case with Dr. Angelena Form. Continue to hold lisinopril for now. Please recheck BMET early next week with a BP check.  Thanks! Dayna Dunn [name]

## 2015-11-14 ENCOUNTER — Encounter: Payer: Self-pay | Admitting: Physician Assistant

## 2015-11-14 DIAGNOSIS — E1159 Type 2 diabetes mellitus with other circulatory complications: Secondary | ICD-10-CM | POA: Insufficient documentation

## 2015-11-14 DIAGNOSIS — I10 Essential (primary) hypertension: Secondary | ICD-10-CM | POA: Insufficient documentation

## 2015-11-14 DIAGNOSIS — I152 Hypertension secondary to endocrine disorders: Secondary | ICD-10-CM | POA: Insufficient documentation

## 2015-11-14 NOTE — Progress Notes (Addendum)
Cardiology Office Note Date:  11/15/2015  Patient ID:  Liangelo, Maldonado 1941/08/20, MRN [id number] PCP:  [name], [name]  Cardiologist:  Dr. Angelena Form   Chief Complaint: f/u cardioversion  History of Present Illness: Steven Maldonado is a 75 y.o. 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  Per review of chart he was evaluated in 2012 for feeling weak/sweaty. He was found to be in AF with RVR. He ruled in for MI with positive cardiac enzymes and cath showed occluded RCA with collaterals. He was started on Pradaxa and underwent DCCV 07/2011 to NSR. 2D Echo 12/2014: mild LVH, EF 60-65%, no RWMA, grade 2 DD, mod AS, mild LAE/RAE. Labs in 09/2015 showed TSH 5.475 with normal free T4, LDL 86, A1C 11.2, Cr 1.26. He was seen in the office 11/02/15 for recurrence of atrial fibrillation. This was manifested by decreased exercise tolerance. This was the first known sustained episode since 2012. He reported med compliance and subsequently underwent DCCV to NSR the following day. F/u labs showed mild anemia (Hgb 11.4-> asked to f/u PCP), BUN/Cr 27/1.62, TSH 6.9 with normal free T4. He was asked to f/u PCP for abnormal thyroid. We asked the patient to hold his Lasix but he ended up only doing so for 1 day. F/u BMET 12/28 showed Cr 1.54 but K was high at 5.9, possibly hemolyzed. Repeat specimen showed Cr 1.66 and K of 5.6. He was asked to hold his lisinopril. He has planned repeat BMET and echo today.   He comes back in for follow-up today feeling well. No CP, SOB, palpitations, syncope or bleeding. He is feeling back to baseline. He does not recall being told about his abnormal thyroid function. The nurse did document this on phone note 11/02/15.   Past Medical History  Diagnosis  Date  . PAF (paroxysmal atrial fibrillation) ([place])     a. dx 2012  s/p DCCV 07/2011  . Hyperlipidemia   . Essential hypertension   . Coronary artery disease     a. Cath February 2012 in [place] [facility], occluded RCA with collaterals  . Aortic stenosis     a. Mod by echo 12/2014.  [name] Kitchen CKD (chronic kidney disease), stage III   . Subclinical hypothyroidism   . DM type 2 (diabetes mellitus, type 2) ([place])   . Cataract associated with type 2 diabetes mellitus ([place]) 10/06/2013  . Obstructive sleep apnea     2012  . Sleep apnea with use of continuous positive airway pressure (CPAP)     04-11-11 AHI was 32.9 and titrated to 15 cm H20, DME is [facility]  . Skin cancer     "cut off back of neck X 2; off left upper arm; right wrist, near right shoulder blade; several burned off other areas of my body" (06/08/2014)  . NSTEMI (non-ST elevated myocardial infarction) ([place]) 12/2010  . Diabetic ulcer of heel ([place]) 07/14/2014  . PVD (peripheral vascular disease) ([place])     a. s/p R popliteal artery stenosis tx with drug-coated balloon 05/2014, followed by Dr. Fletcher Anon.  . Hyperkalemia     Past Surgical History  Procedure Laterality Date  . Appendectomy  1965  . Cardioversion  07/2011  . Skin cancer excision Bilateral     "have had  them cut off back of neck X 2; off left upper arm; right wrist, near right shoulder blade" (06/08/2014)  . Cardioversion N/A 04/18/2014    Procedure: CARDIOVERSION;  Surgeon: Dorothy Spark, MD;  Location: [facility];  Service: Cardiovascular;  Laterality: N/A;  . Popliteal artery angioplasty Right 06/08/2014    Archie Endo 06/08/2014  . Cardiac catheterization  12/2010  . Abdominal angiogram N/A 06/08/2014    Procedure: ABDOMINAL ANGIOGRAM;  Surgeon: Wellington Hampshire, MD;  Location: [facility] CATH LAB;  Service: Cardiovascular;  Laterality: N/A;  . Lower extremity angiogram N/A 06/08/2014    Procedure: LOWER EXTREMITY ANGIOGRAM;  Surgeon: Wellington Hampshire, MD;  Location: [facility] CATH LAB;  Service: Cardiovascular;   Laterality: N/A;  . Cardioversion N/A 11/03/2015    Procedure: CARDIOVERSION;  Surgeon: Lelon Perla, MD;  Location: [facility] ENDOSCOPY;  Service: Cardiovascular;  Laterality: N/A;    Current Outpatient Prescriptions  Medication Sig Dispense Refill  . aspirin EC 81 MG tablet Take 81 mg by mouth daily.    [name] Kitchen atorvastatin (LIPITOR) 80 MG tablet Take 1 tablet (80 mg total) by mouth at bedtime. 90 tablet 3  . Blood Glucose Monitoring Suppl (FREESTYLE LITE) DEVI 1 each by Does not apply route 2 (two) times daily. 1 each 0  . Choline Fenofibrate (TRILIPIX) 135 MG capsule Take 1 capsule (135 mg total) by mouth daily. 90 capsule 3  . docusate sodium (COLACE) 100 MG capsule Take 100 mg by mouth daily as needed for mild constipation.    . fesoterodine (TOVIAZ) 4 MG TB24 tablet Take 4 mg by mouth daily.    . furosemide (LASIX) 20 MG tablet Take 1 tablet (20 mg total) by mouth daily. 90 tablet 3  . glucose blood (FREESTYLE LITE) test strip 1 each by Other route 4 (four) times daily -  before meals and at bedtime. 150 each 11  . insulin glargine (LANTUS) 100 UNIT/ML injection Inject 0.6 mLs (60 Units total) into the skin daily. (Patient taking differently: Inject 60 Units into the skin at bedtime. ) 60 mL 3  . insulin lispro (HUMALOG) 100 UNIT/ML KiwkPen Inject 0.05 mLs (5 Units total) into the skin 3 (three) times daily. 15 mL 3  . Insulin Pen Needle (BD PEN NEEDLE NANO U/F) 32G X 4 MM MISC 1 each by Does not apply route 4 (four) times daily. 400 each 3  . Insulin Syringe-Needle U-100 (BD INSULIN SYRINGE ULTRAFINE) 31G X 5/16" 1 ML MISC 1 Syringe by Does not apply route daily. 100 each 3  . magnesium oxide (MAG-OX) 400 MG tablet Take 1 tablet (400 mg total) by mouth daily. 30 tablet 11  . metFORMIN (GLUCOPHAGE) 1000 MG tablet Take 1 tablet (1,000 mg total) by mouth 2 (two) times daily with a meal. 180 tablet 1  . metoprolol (LOPRESSOR) 50 MG tablet Take 1 tablet (50 mg total) by mouth 2 (two) times daily. 180  tablet 3  . MYRBETRIQ 25 MG TB24 tablet TAKE 1 TABLET (25 MG TOTAL) BY MOUTH DAILY. 30 tablet 5  . PRADAXA 150 MG CAPS capsule TAKE 1 CAPSULE EVERY 12    HOURS 180 capsule 3   No current facility-administered medications for this visit.    Allergies:   Review of patient's allergies indicates no known allergies.   Social History:  The patient  reports that he quit smoking about 43 years ago. His smoking use included Cigarettes. He has a 28 pack-year smoking history. He has never used smokeless tobacco. He reports  that he does not drink alcohol or use illicit drugs.   Family History:  The patient's family history includes Diabetes in his brother, daughter, father, mother, other, and sister; Heart Problems in his daughter; Heart attack in his father and mother; Heart failure in his father; Hypertension in his brother, father, mother, and sister; Stroke in his brother.  ROS:  Please see the history of present illness. All other systems are reviewed and otherwise negative.   PHYSICAL EXAM:  VS:  BP 124/62 mmHg  Pulse 68  Ht 5\' 11"  (1.803 m)  Wt 253 lb (114.76 kg)  BMI 35.30 kg/m2 BMI: Body mass index is 35.3 kg/(m^2). Well developed obese WM, in no acute distress HEENT: normocephalic, atraumatic Neck: no JVD, carotid bruits or masses Cardiac:  RRR; soft SEM - high pitched over LSB, lower pitched over RSB, S2 diminished Lungs:  clear to auscultation bilaterally, no wheezing, rhonchi or rales Abd: soft, nontender, no hepatomegaly, + BS MS: no deformity or atrophy Ext: no edema Skin: warm and dry, no rash Neuro:  moves all extremities spontaneously, no focal abnormalities noted, follows commands Psych: euthymic mood, full affect  EKG:  Done today shows NSR 61bpm, nonspecific TWI I, II, III, avF, V5-V6, prior inferior infarct. Similar to prior sinus tracings.  Recent Labs: 10/02/2015: ALT 23 11/02/2015: Hemoglobin 11.4*; Platelets 218; TSH 6.927* 11/08/2015: Magnesium 2.1 11/09/2015:  BUN 16; Creatinine, Ser 1.66*; Potassium 5.6*; Sodium 140  10/02/2015: Cholesterol 145; HDL 31*; LDL Cholesterol 86; Total CHOL/HDL Ratio 4.7; Triglycerides 139; VLDL 28   Estimated Creatinine Clearance: 51.8 mL/min (by C-G formula based on Cr of 1.66).   Wt Readings from Last 3 Encounters:  11/15/15 253 lb (114.76 kg)  11/03/15 260 lb (117.935 kg)  11/02/15 260 lb 9.6 oz (118.207 kg)     Other studies reviewed: Additional studies/records reviewed today include: summarized above  ASSESSMENT AND PLAN:  1. Paroxysmal atrial fibrillation - doing well s/p DCCV. Continue current regimen. If he has recurrent atrial fib, would benefit from referral to AF clinic to discuss antiarrhythmic medication. I reiterated the need to f/u PCP for thyroid abnormality - may benefit from treatment for subclinical hypothyroidism. 2. CAD - he has not had any recent chest pain. Dyspnea resolved after conversion to NSR. Continue current regimen and surveillance for recurrent sx. Will defer decision regarding long-term plan for aspirin in setting of concomitant Pradaxa to primary cardiologist in follow-up. He has not had any bleeding. 3. Essential HTN - BP stable off lisinopril (ACEI held 2/2 hyperkalemia). 4. Moderate AS - repeat echo pending. 5. CKD stage III with recent hyperkalemia - f/u BMET today.   Disposition: F/u with Dr. Angelena Form in 6 months.  Current medicines are reviewed at length with the patient today.  The patient did not have any concerns regarding medicines.  Raechel Ache [name] 11/15/2015 9:49 AM     [facility] [address] [place] [address] [phone] (office)  [phone] (fax)

## 2015-11-15 ENCOUNTER — Ambulatory Visit (HOSPITAL_COMMUNITY): Payer: Medicare Other | Attending: Cardiology

## 2015-11-15 ENCOUNTER — Other Ambulatory Visit (INDEPENDENT_AMBULATORY_CARE_PROVIDER_SITE_OTHER): Payer: Medicare Other | Admitting: *Deleted

## 2015-11-15 ENCOUNTER — Encounter: Payer: Self-pay | Admitting: Physician Assistant

## 2015-11-15 ENCOUNTER — Other Ambulatory Visit: Payer: Self-pay

## 2015-11-15 ENCOUNTER — Ambulatory Visit (INDEPENDENT_AMBULATORY_CARE_PROVIDER_SITE_OTHER): Payer: Medicare Other | Admitting: Physician Assistant

## 2015-11-15 VITALS — BP 124/62 | HR 68 | Ht 71.0 in | Wt 253.0 lb

## 2015-11-15 DIAGNOSIS — N183 Chronic kidney disease, stage 3 unspecified: Secondary | ICD-10-CM

## 2015-11-15 DIAGNOSIS — I480 Paroxysmal atrial fibrillation: Secondary | ICD-10-CM | POA: Diagnosis not present

## 2015-11-15 DIAGNOSIS — I352 Nonrheumatic aortic (valve) stenosis with insufficiency: Secondary | ICD-10-CM | POA: Diagnosis not present

## 2015-11-15 DIAGNOSIS — E785 Hyperlipidemia, unspecified: Secondary | ICD-10-CM | POA: Insufficient documentation

## 2015-11-15 DIAGNOSIS — E039 Hypothyroidism, unspecified: Secondary | ICD-10-CM | POA: Insufficient documentation

## 2015-11-15 DIAGNOSIS — I10 Essential (primary) hypertension: Secondary | ICD-10-CM

## 2015-11-15 DIAGNOSIS — G4733 Obstructive sleep apnea (adult) (pediatric): Secondary | ICD-10-CM | POA: Diagnosis not present

## 2015-11-15 DIAGNOSIS — Z6836 Body mass index (BMI) 36.0-36.9, adult: Secondary | ICD-10-CM | POA: Diagnosis not present

## 2015-11-15 DIAGNOSIS — Z87891 Personal history of nicotine dependence: Secondary | ICD-10-CM | POA: Diagnosis not present

## 2015-11-15 DIAGNOSIS — E119 Type 2 diabetes mellitus without complications: Secondary | ICD-10-CM | POA: Insufficient documentation

## 2015-11-15 DIAGNOSIS — E669 Obesity, unspecified: Secondary | ICD-10-CM | POA: Insufficient documentation

## 2015-11-15 DIAGNOSIS — I517 Cardiomegaly: Secondary | ICD-10-CM | POA: Insufficient documentation

## 2015-11-15 DIAGNOSIS — I340 Nonrheumatic mitral (valve) insufficiency: Secondary | ICD-10-CM | POA: Diagnosis not present

## 2015-11-15 DIAGNOSIS — I350 Nonrheumatic aortic (valve) stenosis: Secondary | ICD-10-CM | POA: Diagnosis present

## 2015-11-15 DIAGNOSIS — E875 Hyperkalemia: Secondary | ICD-10-CM

## 2015-11-15 DIAGNOSIS — I2510 Atherosclerotic heart disease of native coronary artery without angina pectoris: Secondary | ICD-10-CM

## 2015-11-15 DIAGNOSIS — I059 Rheumatic mitral valve disease, unspecified: Secondary | ICD-10-CM | POA: Diagnosis not present

## 2015-11-15 LAB — BASIC METABOLIC PANEL
BUN: 24 mg/dL (ref 7–25)
CO2: 19 mmol/L — ABNORMAL LOW (ref 20–31)
Calcium: 9.7 mg/dL (ref 8.6–10.3)
Chloride: 104 mmol/L (ref 98–110)
Creat: 1.46 mg/dL — ABNORMAL HIGH (ref 0.70–1.18)
Glucose, Bld: 232 mg/dL — ABNORMAL HIGH (ref 65–99)
Potassium: 4.3 mmol/L (ref 3.5–5.3)
Sodium: 137 mmol/L (ref 135–146)

## 2015-11-15 NOTE — Addendum Note (Signed)
Addended by: Eulis Foster on: 11/15/2015 08:47 AM   Modules accepted: Orders

## 2015-11-15 NOTE — Patient Instructions (Signed)
Medication Instructions:  No changes today  Labwork: None today  Testing/Procedures: None today  Follow-Up: Your physician wants you to follow-up in: 6 months with Dr Angelena Form. (July 2017). You will receive a reminder letter in the mail two months in advance. If you don't receive a letter, please call our office to schedule the follow-up appointment.   Any Other Special Instructions Will Be Listed Below (If Applicable).  Please discuss your thyroid function lab results with your primary care doctor.       If you need a refill on your cardiac medications before your next appointment, please call your pharmacy. +

## 2016-01-09 DIAGNOSIS — Z08 Encounter for follow-up examination after completed treatment for malignant neoplasm: Secondary | ICD-10-CM | POA: Diagnosis not present

## 2016-01-09 DIAGNOSIS — D225 Melanocytic nevi of trunk: Secondary | ICD-10-CM | POA: Diagnosis not present

## 2016-01-09 DIAGNOSIS — Z85828 Personal history of other malignant neoplasm of skin: Secondary | ICD-10-CM | POA: Diagnosis not present

## 2016-01-09 DIAGNOSIS — D485 Neoplasm of uncertain behavior of skin: Secondary | ICD-10-CM | POA: Diagnosis not present

## 2016-01-09 DIAGNOSIS — C44612 Basal cell carcinoma of skin of right upper limb, including shoulder: Secondary | ICD-10-CM | POA: Diagnosis not present

## 2016-01-15 ENCOUNTER — Ambulatory Visit: Payer: Medicare Other | Admitting: Adult Health

## 2016-01-18 ENCOUNTER — Ambulatory Visit: Payer: Self-pay | Admitting: Neurology

## 2016-01-18 ENCOUNTER — Other Ambulatory Visit: Payer: Self-pay | Admitting: *Deleted

## 2016-01-18 MED ORDER — METFORMIN HCL 1000 MG PO TABS: 1000.0000 mg | ORAL_TABLET | Freq: Two times a day (BID) | ORAL | Status: DC

## 2016-01-18 NOTE — Telephone Encounter (Signed)
Pt called needing refill on Metformin, script sent to pharmacy

## 2016-01-22 DIAGNOSIS — H6123 Impacted cerumen, bilateral: Secondary | ICD-10-CM | POA: Insufficient documentation

## 2016-02-01 ENCOUNTER — Ambulatory Visit (INDEPENDENT_AMBULATORY_CARE_PROVIDER_SITE_OTHER): Payer: Medicare Other | Admitting: Neurology

## 2016-02-01 ENCOUNTER — Encounter: Payer: Self-pay | Admitting: Neurology

## 2016-02-01 VITALS — BP 138/70 | HR 68 | Resp 20 | Ht 71.0 in | Wt 250.0 lb

## 2016-02-01 DIAGNOSIS — Z9989 Dependence on other enabling machines and devices: Secondary | ICD-10-CM

## 2016-02-01 DIAGNOSIS — G4733 Obstructive sleep apnea (adult) (pediatric): Secondary | ICD-10-CM

## 2016-02-01 DIAGNOSIS — I2510 Atherosclerotic heart disease of native coronary artery without angina pectoris: Secondary | ICD-10-CM | POA: Diagnosis not present

## 2016-02-01 NOTE — Progress Notes (Signed)
SLEEP MEDICINE CLINIC    Provider:  Larey Seat, M D  Referring Provider: [name], [name] Primary Care Physician:  [name], [name]  Chief Complaint  Patient presents with  . Follow-up    osa on cpap, rm 11, alone    HPI:  Steven Maldonado is a 75 y.o. male  Is seen here as a revisit from Dr. Doren Custard for follow up on CPAP compliance.   Steven Maldonado is happily married in his 4 marriage , his 2 first wives dies, his third one divorced him and died later of a lung condition. He just lost a brother. He endorsed no depression.  He is seen here today for compliance revisit he used to machine 26 out of 30 days 87% compliance on 5 of those days less than 5 hours. Average user time is 6 hours 11 minutes the CPAP is still set at 14 cm water pressure with 3 cm EPR. His AHI is 1.4 he is excellently controlled he did have some significant air leaks over the last generally but was recently given and no fullface mask by [facility] in [place], his current DME.   07-14-14 The patient has been using her CPAP machine compliantly for an average of 5 hours and 27 minutes nightly, his residual AHI is 1.0 indicating a very good response. Currently has machine is set at 14 cm pressure the 3 cm EPR he does have some significant air leaks and his compliance is 71%. Steven Maldonado has switched to medical equipment company from previously advanced on care to a local office and asked oral require yearly download and I would like for these to be available at his September yearly visit. His response to the CPAP indicates that he is indeed helped by CPAP he was.His geriatric depression score was  Zero , Epworth sleepiness score at 2 , the fatigue severity score at 11 points.These are excellent results. The patient had no falls in the last 6 months. He had a DVT at the right knee, and underwent an angioplasty of the leg artery, developed an ulcer at the heel- he [place] seen at  the wound care center. He is  walking daily with a cane, but this is a problem with weight on the heel. He gained some weight . His sleep habits have not changed. He wakes up refreshed and restored.    Last visit note 2014. CD Steven Maldonado was originally diagnosed with an AHI of 39 of 32.9. Titrated to CPAP to 15 cm and changed temporarily to a BiPAP. The patient did however advanced under 14 cm CPAP and has continued to use his machine at this setting. His past medical history was hypertension, diabetes, insulin-dependent, hyperlipidemia, coronary artery disease, and atrial fibrillation. He recently got an update from his cardiologist  ,stating that his heart was doing well and that there seems to be in no acute problem in terms of atrial fibrillation or his valve.  The patient's cardiologist is Dr. [facility]. The patient had suffered a heart attack and CABG 2012.  There is no shift work history, his bedtime is around midnight and since his retirement he doesn't need to raise the right in the morning - sleeps until  8 or 9 AM. He considers his sleep refreshing, has one nocturia He still sometimes has a dry mouth. He had reported that this water reservoir of the CPAP machine gets depleted every night and continues to do. He has a full face mask.  Was able today to compare the CPAP download from September 2013 with the download from this year 2014 . The patient average usage for CPAP is 5 hours and 45 minutes his 100% compliance, his residual AHI is 0.5% pressure is now 14 cm EPI level is 3 cm water. The air leak has been reduced since he changed to a smaller version of a full face mask. 2014 : GDS score is 4, the patient had more falls in the last 6 months. Epworth is 2 .   Review of Systems: Out of a complete 14 system review, the patient complains of only the following symptoms, and all other reviewed systems are negative. FSS 11, epworth 2, 71% compliant with CPAP at 14 cm water.  Medium sized mirage Quattro FFM - he  failed a nasal pillow and nasal mask- air leaked orally. No facial marks. .     Past Medical History  Diagnosis Date  . PAF (paroxysmal atrial fibrillation) ([place])     a. dx 2012  s/p DCCV 07/2011  . Hyperlipidemia   . Essential hypertension   . Coronary artery disease     a. Cath February 2012 in [place] [facility], occluded RCA with collaterals  . Aortic stenosis     a. Mod by echo 12/2014.  [name] Kitchen CKD (chronic kidney disease), stage III   . Subclinical hypothyroidism   . DM type 2 (diabetes mellitus, type 2) ([place])   . Cataract associated with type 2 diabetes mellitus ([place]) 10/06/2013  . Obstructive sleep apnea     2012  . Sleep apnea with use of continuous positive airway pressure (CPAP)     04-11-11 AHI was 32.9 and titrated to 15 cm H20, DME is [facility]  . Skin cancer     "cut off back of neck X 2; off left upper arm; right wrist, near right shoulder blade; several burned off other areas of my body" (06/08/2014)  . NSTEMI (non-ST elevated myocardial infarction) ([place]) 12/2010  . Diabetic ulcer of heel ([place]) 07/14/2014  . PVD (peripheral vascular disease) ([place])     a. s/p R popliteal artery stenosis tx with drug-coated balloon 05/2014, followed by Dr. Fletcher Anon.  . Hyperkalemia     Allergies as of 02/01/2016  . (No Known Allergies)    Vitals: BP 138/70 mmHg  Pulse 68  Resp 20  Ht 5\' 11"  (1.803 m)  Wt 250 lb (113.399 kg)  BMI 34.88 kg/m2 Last Weight:  Wt Readings from Last 1 Encounters:  02/01/16 250 lb (113.399 kg)   Last Height:   Ht Readings from Last 1 Encounters:  02/01/16 5\' 11"  (1.803 m)   Physical exam:  General: The patient is awake, alert and appears not in acute distress. The patient is well groomed. Head: Normocephalic, atraumatic. Neck is supple. Mallampati 3 , neck circumference: 19.5 inches,  no nasal deviation, mld dysarthria, dysphonia.  Cardiovascular:  Irregular, without  murmurs or carotid bruit, and without distended neck veins. Respiratory: Lungs are clear to  auscultation. Skin:  Without evidence of edema, or rash Trunk: BMI is elevated . Patient has normal posture.  Neurologic exam : The patient is awake and alert, oriented to place and time.   Memory subjective  described as intact. There is a normal attention span & concentration ability.  Speech is fluent without aphasia. Mood and affect are appropriate.  Cranial nerves: Pupils are equal and briskly reactive to light. Funduscopic exam without  evidence of pallor or edema.  Extraocular movements  in vertical  and horizontal planes intact and without nystagmus. Visual fields by finger perimetry are intact. Hearing to finger rub intact.  Facial sensation intact to fine touch. Facial motor strength is symmetric and tongue and uvula move midline.  Assessment:   OSA in this overweight patient with higher grade Mallampati.  The patient's compliance download from 12/08/2015 showed an 87% compliance. On 5 days of the cycle he did not use the CPAP for 4 hours but shorter. Average user time is 6 hours and 11 minutes, set pressure is 14 cm water pressure with 3 cm EPR and his residual AHI remains very good controlled at 1.4. The needs to be no adjustments made to the settings of the machine. The patient does have some nights with significant air leaks.  His DME is in [place]. His new mask is a FFM , [facility].    Plan:  Treatment plan and additional workup :  CPAP at 14 cm water, no adjustment needed.  Weight reduction discussed. Main risk factor for this patient.  RV with NP in 12 month.

## 2016-02-01 NOTE — Patient Instructions (Signed)

## 2016-02-05 ENCOUNTER — Encounter: Payer: Self-pay | Admitting: *Deleted

## 2016-02-05 ENCOUNTER — Ambulatory Visit: Payer: Medicare Other | Admitting: Physician Assistant

## 2016-02-07 ENCOUNTER — Ambulatory Visit: Payer: Self-pay | Admitting: Physician Assistant

## 2016-02-15 ENCOUNTER — Encounter: Payer: Self-pay | Admitting: Cardiovascular Disease

## 2016-02-15 ENCOUNTER — Ambulatory Visit (INDEPENDENT_AMBULATORY_CARE_PROVIDER_SITE_OTHER): Payer: Medicare Other | Admitting: Cardiovascular Disease

## 2016-02-15 VITALS — BP 130/28 | HR 74 | Ht 71.0 in | Wt 254.0 lb

## 2016-02-15 DIAGNOSIS — I480 Paroxysmal atrial fibrillation: Secondary | ICD-10-CM | POA: Diagnosis not present

## 2016-02-15 DIAGNOSIS — I10 Essential (primary) hypertension: Secondary | ICD-10-CM | POA: Diagnosis not present

## 2016-02-15 DIAGNOSIS — I350 Nonrheumatic aortic (valve) stenosis: Secondary | ICD-10-CM

## 2016-02-15 DIAGNOSIS — I2510 Atherosclerotic heart disease of native coronary artery without angina pectoris: Secondary | ICD-10-CM

## 2016-02-15 DIAGNOSIS — I739 Peripheral vascular disease, unspecified: Secondary | ICD-10-CM

## 2016-02-15 NOTE — Patient Instructions (Signed)

## 2016-02-15 NOTE — Progress Notes (Signed)
Chief Complaint  Patient presents with  . Follow-up    no complaints  . Atrial Fibrillation  . PAD    History of Present Illness: 75 yo WM with h/o DM, HTN, hyperlipidemia and CAD here for cardiac follow up. I saw him as a new patient 01/11/11. Prior to his first appt, he woke up while in [place] and felt weak and sweaty. He was found to have atrial fibrillation with RVR. He ruled in for an MI with cardiac enzymes. Cardiac cath with occluded RCA with collaterals. He was hospitalized on 12/27/10 for four days at [facility]. He was started on Pradaxa for anticoagulation. DCCV on 08/02/11 with conversion to sinus rhythm. Echo February 2016 with normal LV function, moderate aortic stenosis. His PAD is followed by Dr. Fletcher Anon for claudication. 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 Most recent non-invasive LE testing April 2016 was stable. Echo 12/2014: mild LVH, EF 60-65%, no RWMA, grade 2 DD, mod AS, mild LAE/RAE. Labs in 09/2015 showed TSH 5.475 with normal free T4, LDL 86, A1C 11.2, Cr 1.26. He was seen in the office 11/02/15 for recurrence of atrial fibrillation. This was the first known sustained episode since 2012. He reported med compliance and subsequently underwent DCCV to NSR the following day. F/u labs showed mild anemia (Hgb 11.4-> asked to f/u PCP), BUN/Cr 27/1.62, TSH 6.9 with normal free T4. He was asked to f/u PCP for abnormal thyroid. We asked the patient to hold his Lasix but he ended up only doing so for 1 day. F/u BMET 12/28 showed Cr 1.54 but K was high at 5.9, possibly hemolyzed. Repeat specimen showed Cr 1.66 and K of 5.6. He was asked to hold his lisinopril. Follow up echo January 2017 with normal LV systolic function. His aortic stenosis is now severe with mean gradient 39 mm Hg.    He is here today for follow up. He is still in sinus rhythm. No palpitations. No chest pain or SOB. He feels great.   Primary Care Physician:  Dena Billet  Last Lipid Profile: Followed in primary care.   Past Medical History  Diagnosis Date  . PAF (paroxysmal atrial fibrillation) ([place])     a. dx 2012  s/p DCCV 07/2011  . Hyperlipidemia   . Essential hypertension   . Coronary artery disease     a. Cath February 2012 in [facility] [facility], occluded RCA with collaterals  . Aortic stenosis     a. Mod by echo 12/2014.  [name] Kitchen CKD (chronic kidney disease), stage III   . Subclinical hypothyroidism   . DM type 2 (diabetes mellitus, type 2) ([place])   . Cataract associated with type 2 diabetes mellitus ([place]) 10/06/2013  . Obstructive sleep apnea     2012  . Sleep apnea with use of continuous positive airway pressure (CPAP)     04-11-11 AHI was 32.9 and titrated to 15 cm H20, DME is [facility]  . Skin cancer     "cut off back of neck X 2; off left upper arm; right wrist, near right shoulder blade; several burned off other areas of my body" (06/08/2014)  . NSTEMI (non-ST elevated myocardial infarction) ([place]) 12/2010  . Diabetic ulcer of heel ([place]) 07/14/2014  . PVD (peripheral vascular disease) ([place])     a. s/p R popliteal artery stenosis tx with drug-coated balloon 05/2014, followed by Dr. Fletcher Anon.  . Hyperkalemia     Past Surgical History  Procedure Laterality Date  .  Appendectomy  1965  . Cardioversion  07/2011  . Skin cancer excision Bilateral     "have had them cut off back of neck X 2; off left upper arm; right wrist, near right shoulder blade" (06/08/2014)  . Cardioversion N/A 04/18/2014    Procedure: CARDIOVERSION;  Surgeon: Dorothy Spark, MD;  Location: [facility];  Service: Cardiovascular;  Laterality: N/A;  . Popliteal artery angioplasty Right 06/08/2014    Archie Endo 06/08/2014  . Cardiac catheterization  12/2010  . Abdominal angiogram N/A 06/08/2014    Procedure: ABDOMINAL ANGIOGRAM;  Surgeon: Wellington Hampshire, MD;  Location: [facility] C[facility] LAB;  Service: Cardiovascular;  Laterality: N/A;  . Lower extremity angiogram N/A 06/08/2014    Procedure: LOWER  EXTREMITY ANGIOGRAM;  Surgeon: Wellington Hampshire, MD;  Location: [facility] CATH LAB;  Service: Cardiovascular;  Laterality: N/A;  . Cardioversion N/A 11/03/2015    Procedure: CARDIOVERSION;  Surgeon: Lelon Perla, MD;  Location: [facility] ENDOSCOPY;  Service: Cardiovascular;  Laterality: N/A;    Current Outpatient Prescriptions  Medication Sig Dispense Refill  . aspirin EC 81 MG tablet Take 81 mg by mouth daily.    [name] Kitchen atorvastatin (LIPITOR) 80 MG tablet Take 1 tablet (80 mg total) by mouth at bedtime. 90 tablet 3  . Blood Glucose Monitoring Suppl (FREESTYLE LITE) DEVI 1 each by Does not apply route 2 (two) times daily. 1 each 0  . Choline Fenofibrate (TRILIPIX) 135 MG capsule Take 1 capsule (135 mg total) by mouth daily. 90 capsule 3  . docusate sodium (COLACE) 100 MG capsule Take 100 mg by mouth daily as needed for mild constipation.    . fesoterodine (TOVIAZ) 4 MG TB24 tablet Take 4 mg by mouth daily.    . furosemide (LASIX) 20 MG tablet Take 1 tablet (20 mg total) by mouth daily. 90 tablet 3  . glucose blood (FREESTYLE LITE) test strip 1 each by Other route 4 (four) times daily -  before meals and at bedtime. 150 each 11  . insulin glargine (LANTUS) 100 UNIT/ML injection Inject 0.6 mLs (60 Units total) into the skin daily. (Patient taking differently: Inject 60 Units into the skin at bedtime. ) 60 mL 3  . insulin lispro (HUMALOG) 100 UNIT/ML KiwkPen Inject 0.05 mLs (5 Units total) into the skin 3 (three) times daily. 15 mL 3  . Insulin Pen Needle (BD PEN NEEDLE NANO U/F) 32G X 4 MM MISC 1 each by Does not apply route 4 (four) times daily. 400 each 3  . Insulin Syringe-Needle U-100 (BD INSULIN SYRINGE ULTRAFINE) 31G X 5/16" 1 ML MISC 1 Syringe by Does not apply route daily. 100 each 3  . magnesium oxide (MAG-OX) 400 MG tablet Take 1 tablet (400 mg total) by mouth daily. 30 tablet 11  . metFORMIN (GLUCOPHAGE) 1000 MG tablet Take 1 tablet (1,000 mg total) by mouth 2 (two) times daily with a meal. 180 tablet  1  . metoprolol (LOPRESSOR) 50 MG tablet Take 1 tablet (50 mg total) by mouth 2 (two) times daily. 180 tablet 3  . MYRBETRIQ 25 MG TB24 tablet TAKE 1 TABLET (25 MG TOTAL) BY MOUTH DAILY. 30 tablet 5  . PRADAXA 150 MG CAPS capsule TAKE 1 CAPSULE EVERY 12    HOURS 180 capsule 3   No current facility-administered medications for this visit.    No Known Allergies  Social History   Social History  . Marital Status: Married    Spouse Name: [name]  . Number of Children: 1  .  Years of Education: 12   Occupational History  . retired    Social History Main Topics  . Smoking status: Former Smoker -- 2.00 packs/day for 14 years    Types: Cigarettes    Quit date: 11/11/1972  . Smokeless tobacco: Never Used  . Alcohol Use: No     Comment: quit in 1984  . Drug Use: No  . Sexual Activity: Not Currently   Other Topics Concern  . 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   Patient has one child and his wife has one child.   Patient is retired.   Patient has a high school education.   Patient is right-handed.   Patient drinks very little caffeine.    Family History  Problem Relation Age of Onset  . Diabetes Mother   . Heart attack Mother   . Heart attack Father   . Heart failure Father   . Diabetes Other   . Diabetes Sister   . Diabetes Brother   . Diabetes Daughter     TYPE ll  . Heart Problems Daughter   . Hypertension Mother   . Hypertension Father   . Hypertension Sister   . Hypertension Brother   . Diabetes Father   . Stroke Brother     Review of Systems:  As stated in the HPI and otherwise negative.   BP 130/28 mmHg  Pulse 74  Ht 5\' 11"  (1.803 m)  Wt 254 lb (115.214 kg)  BMI 35.44 kg/m2  SpO2 98%  Physical Examination: General: Well developed, well nourished, NAD HEENT: OP clear, mucus membranes moist SKIN: warm, dry. No rashes. Neuro: No focal deficits Musculoskeletal: Muscle strength 5/5  all ext Psychiatric: Mood and affect normal Neck: No JVD, no carotid bruits, no thyromegaly, no lymphadenopathy. Lungs:Clear bilaterally, no wheezes, rhonci, crackles Cardiovascular: Regular rate and rhythm. Systolic murmur, gallops or rubs. Abdomen:Soft. Bowel sounds present. Non-tender.  Extremities: No lower extremity edema.   Echo: January 2017: Left ventricle: The cavity size was normal. There was mild focal  basal hypertrophy of the septum. Systolic function was normal.  The estimated ejection fraction was in the range of 55% to 60%.  There is hypokinesis of the basalinferior myocardium. Features  are consistent with a pseudonormal left ventricular filling  pattern, with concomitant abnormal relaxation and increased  filling pressure (grade 2 diastolic dysfunction). - Aortic valve: Cusp separation was reduced. There was severe  stenosis. There was trivial regurgitation. Peak velocity (S): 422  cm/s. Mean gradient (S): 39 mm Hg. Valve area (VTI): 0.67 cm^2.  Valve area (Vmax): 0.66 cm^2. Valve area (Vmean): 0.62 cm^2. - Mitral valve: Moderately calcified annulus. There was mild  regurgitation. - Left atrium: The atrium was mildly dilated. - Right ventricle: The cavity size was mildly dilated. Wall  thickness was normal. - Right atrium: The atrium was mildly dilated. Impressions: - Aortic stenosis has increased when compared to prior.  EKG:  EKG is ordered today. The ekg ordered today demonstrates Sinus, rate 50 bpm. Diffuse T wave abn.   Recent Labs: 10/02/2015: ALT 23 11/02/2015: Hemoglobin 11.4*; Platelets 218; TSH 6.927* 11/08/2015: Magnesium 2.1 11/15/2015: BUN 24; Creat 1.46*; Potassium 4.3; Sodium 137   Lipid Panel    Component Value Date/Time   CHOL 145 10/02/2015 1252   TRIG 139 10/02/2015 1252   HDL 31* 10/02/2015 1252   CHOLHDL 4.7 10/02/2015 1252   VLDL 28 10/02/2015 1252  [place] 86 10/02/2015 1252     Wt Readings from Last 3 Encounters:    02/15/16 254 lb (115.214 kg)  02/01/16 250 lb (113.399 kg)  11/15/15 253 lb (114.76 kg)     Other studies Reviewed: Additional studies/ records that were reviewed today include: . Review of the above records demonstrates:    Assessment and Plan:   1. CAD: Stable. No angina. No changes. Continue current meds.   2. Aortic stenosis: Severe by echo January 2017.  We have discussed AVR vs TAVR. He is asymptomatic. Will see him back in 6 months. No indication right now for AVR.   3. ATRIAL FIBRILLATION: Maintaining NSR. Continue beta blocker. Continue Pradaxa for anti-coagulation.   4. HTN: BP controlled. Continue current therapy.   5. HLD: He is on a statin. Lipids followed in primary care  6. PAD: followed by Dr. Fletcher Anon. Stable by dopplers November 2016.   Current medicines are reviewed at length with the patient today.  The patient does not have concerns regarding medicines.  The following changes have been made:  no change  Labs/ tests ordered today include:   Orders Placed This Encounter  Procedures  . EKG 12-Lead    Disposition:   FU with me in 6 months  Signed, [name], MD 02/15/2016 3:34 PM    [facility] [place], [place], [place]  [address] Phone: [phone]; Fax: [phone]

## 2016-02-20 ENCOUNTER — Ambulatory Visit (INDEPENDENT_AMBULATORY_CARE_PROVIDER_SITE_OTHER): Payer: Medicare Other | Admitting: Cardiovascular Disease

## 2016-02-20 VITALS — BP 132/64 | HR 65 | Ht 71.0 in | Wt 253.6 lb

## 2016-02-20 DIAGNOSIS — I739 Peripheral vascular disease, unspecified: Secondary | ICD-10-CM

## 2016-02-20 DIAGNOSIS — I2510 Atherosclerotic heart disease of native coronary artery without angina pectoris: Secondary | ICD-10-CM

## 2016-02-20 NOTE — Progress Notes (Signed)
Cardiology Office Note   Date:  02/20/2016   ID:  Steven Maldonado, DOB 10/15/1941, MRN [id number]  PCP:  [name], [name]  Cardiologist:  Dr. Angelena Form  No chief complaint on file.     History of Present Illness: Steven Maldonado is a 75 y.o. male who presents for  a followup visit regarding  PAD . He has known hx of DM, HTN, hyperlipidemia, A-fib, moderate aortic stenosis and CAD.  He is s/p NSTEMI in the setting of atrial fibrillation with RVR in 12/2010.  Cardiac cath with occluded RCA with collaterals.Echo in December 2014 with LVEF=55-60%, moderate aortic stenosis, normal wall motion reported.   He was seen in 2015 for worsening severe  right calf claudication with skin breakdown on the right heal that was slow to heal. Angiography showed:  1. No significant aortoiliac disease.  2. Chronically occluded left popliteal artery with 2 vessel runoff below the knee with reasonable collaterals.  3. Occluded heavily calcified right popliteal artery with poor collaterals. 2 vessel runoff below the knee with an occluded posterior tibial.   I performed successful angioplasty and drug-coated balloon angioplasty of the right popliteal artery. He had resolution of claudication since then and the ulcer healed completely.  Most recent angiography showed patent right popliteal artery. ABI didn't drop from before. He denies any claudication or ulcerations. No chest pain or shortness of breath   Past Medical History  Diagnosis Date  . PAF (paroxysmal atrial fibrillation) ([place])     a. dx 2012  s/p DCCV 07/2011  . Hyperlipidemia   . Essential hypertension   . Coronary artery disease     a. Cath February 2012 in [place], occluded RCA with collaterals  . Aortic stenosis     a. Mod by echo 12/2014.  [name] Kitchen CKD (chronic kidney disease), stage III   . Subclinical hypothyroidism   . DM type 2 (diabetes mellitus, type 2) ([place])   . Cataract associated with type 2 diabetes mellitus ([place]) 10/06/2013    . Obstructive sleep apnea     2012  . Sleep apnea with use of continuous positive airway pressure (CPAP)     04-11-11 AHI was 32.9 and titrated to 15 cm H20, DME is [facility]  . Skin cancer     "cut off back of neck X 2; off left upper arm; right wrist, near right shoulder blade; several burned off other areas of my body" (06/08/2014)  . NSTEMI (non-ST elevated myocardial infarction) ([place]) 12/2010  . Diabetic ulcer of heel ([place]) 07/14/2014  . PVD (peripheral vascular disease) ([place])     a. s/p R popliteal artery stenosis tx with drug-coated balloon 05/2014, followed by Dr. Fletcher Anon.  . Hyperkalemia     Past Surgical History  Procedure Laterality Date  . Appendectomy  1965  . Cardioversion  07/2011  . Skin cancer excision Bilateral     "have had them cut off back of neck X 2; off left upper arm; right wrist, near right shoulder blade" (06/08/2014)  . Cardioversion N/A 04/18/2014    Procedure: CARDIOVERSION;  Surgeon: Dorothy Spark, MD;  Location: [facility];  Service: Cardiovascular;  Laterality: N/A;  . Popliteal artery angioplasty Right 06/08/2014    Archie Endo 06/08/2014  . Cardiac catheterization  12/2010  . Abdominal angiogram N/A 06/08/2014    Procedure: ABDOMINAL ANGIOGRAM;  Surgeon: Wellington Hampshire, MD;  Location: [facility] CATH LAB;  Service: Cardiovascular;  Laterality: N/A;  . Lower extremity angiogram N/A 06/08/2014  Procedure: LOWER EXTREMITY ANGIOGRAM;  Surgeon: Wellington Hampshire, MD;  Location: [facility] CATH LAB;  Service: Cardiovascular;  Laterality: N/A;  . Cardioversion N/A 11/03/2015    Procedure: CARDIOVERSION;  Surgeon: Lelon Perla, MD;  Location: [facility] ENDOSCOPY;  Service: Cardiovascular;  Laterality: N/A;     Current Outpatient Prescriptions  Medication Sig Dispense Refill  . aspirin EC 81 MG tablet Take 81 mg by mouth daily.    [name] Kitchen atorvastatin (LIPITOR) 80 MG tablet Take 1 tablet (80 mg total) by mouth at bedtime. 90 tablet 3  . Blood Glucose Monitoring Suppl (FREESTYLE LITE) DEVI 1  each by Does not apply route 2 (two) times daily. 1 each 0  . Choline Fenofibrate (TRILIPIX) 135 MG capsule Take 1 capsule (135 mg total) by mouth daily. 90 capsule 3  . docusate sodium (COLACE) 100 MG capsule Take 100 mg by mouth daily as needed for mild constipation.    . fesoterodine (TOVIAZ) 4 MG TB24 tablet Take 4 mg by mouth daily.    . furosemide (LASIX) 20 MG tablet Take 1 tablet (20 mg total) by mouth daily. 90 tablet 3  . glucose blood (FREESTYLE LITE) test strip 1 each by Other route 4 (four) times daily -  before meals and at bedtime. 150 each 11  . insulin glargine (LANTUS) 100 UNIT/ML injection Inject 0.6 mLs (60 Units total) into the skin daily. (Patient taking differently: Inject 60 Units into the skin at bedtime. ) 60 mL 3  . insulin lispro (HUMALOG) 100 UNIT/ML KiwkPen Inject 0.05 mLs (5 Units total) into the skin 3 (three) times daily. 15 mL 3  . Insulin Pen Needle (BD PEN NEEDLE NANO U/F) 32G X 4 MM MISC 1 each by Does not apply route 4 (four) times daily. 400 each 3  . Insulin Syringe-Needle U-100 (BD INSULIN SYRINGE ULTRAFINE) 31G X 5/16" 1 ML MISC 1 Syringe by Does not apply route daily. 100 each 3  . magnesium oxide (MAG-OX) 400 MG tablet Take 1 tablet (400 mg total) by mouth daily. 30 tablet 11  . metFORMIN (GLUCOPHAGE) 1000 MG tablet Take 1 tablet (1,000 mg total) by mouth 2 (two) times daily with a meal. 180 tablet 1  . metoprolol (LOPRESSOR) 50 MG tablet Take 1 tablet (50 mg total) by mouth 2 (two) times daily. 180 tablet 3  . MYRBETRIQ 25 MG TB24 tablet TAKE 1 TABLET (25 MG TOTAL) BY MOUTH DAILY. 30 tablet 5  . PRADAXA 150 MG CAPS capsule TAKE 1 CAPSULE EVERY 12    HOURS 180 capsule 3   No current facility-administered medications for this visit.    Allergies:   Review of patient's allergies indicates no known allergies.    Social History:  The patient  reports that he quit smoking about 43 years ago. His smoking use included Cigarettes. He has a 28 pack-year  smoking history. He has never used smokeless tobacco. He reports that he does not drink alcohol or use illicit drugs.   Family History:  The patient's family history includes Diabetes in his brother, daughter, father, mother, other, and sister; Heart Problems in his daughter; Heart attack in his father and mother; Heart failure in his father; Hypertension in his brother, father, mother, and sister; Stroke in his brother.    ROS:  Please see the history of present illness.   Otherwise, review of systems are positive for none.   All other systems are reviewed and negative.    PHYSICAL EXAM: VS:  BP 132/64 mmHg  Pulse 65  Ht 5\' 11"  (1.803 m)  Wt 253 lb 9.6 oz (115.032 kg)  BMI 35.39 kg/m2 , BMI Body mass index is 35.39 kg/(m^2). GEN: Well nourished, well developed, in no acute distress HEENT: normal Neck: no JVD, carotid bruits, or masses Cardiac: RRR; no  rubs, or gallops,no edema . There is a 2 out of 6 mid peaking aortic stenosis murmur Respiratory:  clear to auscultation bilaterally, normal work of breathing GI: soft, nontender, nondistended, + BS MS: no deformity or atrophy Skin: warm and dry, no rash Neuro:  Strength and sensation are intact Psych: euthymic mood, full affect Vascular: Femoral pulses are normal. Distal pulses are not palpable.  EKG:  EKG is not ordered today.    Recent Labs: 10/02/2015: ALT 23 11/02/2015: Hemoglobin 11.4*; Platelets 218; TSH 6.927* 11/08/2015: Magnesium 2.1 11/15/2015: BUN 24; Creat 1.46*; Potassium 4.3; Sodium 137    Lipid Panel    Component Value Date/Time   CHOL 145 10/02/2015 1252   TRIG 139 10/02/2015 1252   HDL 31* 10/02/2015 1252   CHOLHDL 4.7 10/02/2015 1252   VLDL 28 10/02/2015 1252   LDLCALC 86 10/02/2015 1252      Wt Readings from Last 3 Encounters:  02/20/16 253 lb 9.6 oz (115.032 kg)  02/15/16 254 lb (115.214 kg)  02/01/16 250 lb (113.399 kg)        ASSESSMENT AND PLAN:  1.  Peripheral arterial disease:  Previous right popliteal artery drug-coated balloon angioplasty with continued patency. He currently has no claudication and no ulceration. Continue medical therapy.   2. Hyperlipidemia: Continue treatment with high dose atorvastatin with a target LDL of less than 70.     Disposition:   FU with me in 1 year  Signed,  [name], MD  02/20/2016 9:08 AM    [place]

## 2016-02-20 NOTE — Patient Instructions (Signed)

## 2016-02-23 ENCOUNTER — Encounter: Payer: Self-pay | Admitting: Cardiovascular Disease

## 2016-03-03 ENCOUNTER — Other Ambulatory Visit: Payer: Self-pay | Admitting: Physician Assistant

## 2016-03-04 ENCOUNTER — Encounter: Payer: Self-pay | Admitting: Family Medicine

## 2016-03-04 NOTE — Telephone Encounter (Signed)
Medication refill for one time only.  Patient needs to be seen.  Letter sent for patient to call and schedule 

## 2016-03-13 ENCOUNTER — Other Ambulatory Visit: Payer: Self-pay | Admitting: Family Medicine

## 2016-03-13 MED ORDER — MIRABEGRON ER 25 MG PO TB24: ORAL_TABLET | ORAL | Status: DC

## 2016-03-15 DIAGNOSIS — Z794 Long term (current) use of insulin: Secondary | ICD-10-CM | POA: Diagnosis not present

## 2016-03-15 DIAGNOSIS — H25813 Combined forms of age-related cataract, bilateral: Secondary | ICD-10-CM | POA: Diagnosis not present

## 2016-03-15 DIAGNOSIS — E113291 Type 2 diabetes mellitus with mild nonproliferative diabetic retinopathy without macular edema, right eye: Secondary | ICD-10-CM | POA: Diagnosis not present

## 2016-03-15 DIAGNOSIS — H353111 Nonexudative age-related macular degeneration, right eye, early dry stage: Secondary | ICD-10-CM | POA: Diagnosis not present

## 2016-04-10 ENCOUNTER — Other Ambulatory Visit: Payer: Self-pay | Admitting: Physician Assistant

## 2016-04-10 NOTE — Telephone Encounter (Signed)
Patient needs refill on his pradaxa  Mail order

## 2016-04-10 NOTE — Telephone Encounter (Signed)
Year supply given in December.  Pt will call [facility]

## 2016-04-10 NOTE — Telephone Encounter (Signed)
Left message for pt to call back  °

## 2016-04-22 DIAGNOSIS — C44612 Basal cell carcinoma of skin of right upper limb, including shoulder: Secondary | ICD-10-CM | POA: Diagnosis not present

## 2016-04-22 DIAGNOSIS — L905 Scar conditions and fibrosis of skin: Secondary | ICD-10-CM | POA: Diagnosis not present

## 2016-05-01 ENCOUNTER — Encounter: Payer: Self-pay | Admitting: Physician Assistant

## 2016-05-01 ENCOUNTER — Ambulatory Visit (INDEPENDENT_AMBULATORY_CARE_PROVIDER_SITE_OTHER): Payer: Medicare Other | Admitting: Physician Assistant

## 2016-05-01 VITALS — BP 114/62 | HR 56 | Temp 98.1°F | Ht 71.0 in | Wt 258.0 lb

## 2016-05-01 DIAGNOSIS — Z794 Long term (current) use of insulin: Secondary | ICD-10-CM | POA: Diagnosis not present

## 2016-05-01 DIAGNOSIS — E785 Hyperlipidemia, unspecified: Secondary | ICD-10-CM

## 2016-05-01 DIAGNOSIS — I739 Peripheral vascular disease, unspecified: Secondary | ICD-10-CM | POA: Diagnosis not present

## 2016-05-01 DIAGNOSIS — E1165 Type 2 diabetes mellitus with hyperglycemia: Secondary | ICD-10-CM

## 2016-05-01 DIAGNOSIS — E1136 Type 2 diabetes mellitus with diabetic cataract: Secondary | ICD-10-CM | POA: Diagnosis not present

## 2016-05-01 DIAGNOSIS — G4733 Obstructive sleep apnea (adult) (pediatric): Secondary | ICD-10-CM | POA: Diagnosis not present

## 2016-05-01 DIAGNOSIS — R7309 Other abnormal glucose: Secondary | ICD-10-CM | POA: Diagnosis not present

## 2016-05-01 DIAGNOSIS — I2510 Atherosclerotic heart disease of native coronary artery without angina pectoris: Secondary | ICD-10-CM

## 2016-05-01 DIAGNOSIS — G4730 Sleep apnea, unspecified: Secondary | ICD-10-CM

## 2016-05-01 DIAGNOSIS — I350 Nonrheumatic aortic (valve) stenosis: Secondary | ICD-10-CM | POA: Diagnosis not present

## 2016-05-01 DIAGNOSIS — Z9989 Dependence on other enabling machines and devices: Secondary | ICD-10-CM

## 2016-05-01 DIAGNOSIS — N189 Chronic kidney disease, unspecified: Secondary | ICD-10-CM

## 2016-05-01 DIAGNOSIS — E038 Other specified hypothyroidism: Secondary | ICD-10-CM | POA: Diagnosis not present

## 2016-05-01 DIAGNOSIS — E669 Obesity, unspecified: Secondary | ICD-10-CM

## 2016-05-01 DIAGNOSIS — E113399 Type 2 diabetes mellitus with moderate nonproliferative diabetic retinopathy without macular edema, unspecified eye: Secondary | ICD-10-CM

## 2016-05-01 DIAGNOSIS — E119 Type 2 diabetes mellitus without complications: Secondary | ICD-10-CM | POA: Diagnosis not present

## 2016-05-01 DIAGNOSIS — E039 Hypothyroidism, unspecified: Secondary | ICD-10-CM
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
## 2016-05-02 ENCOUNTER — Telehealth: Payer: Self-pay | Admitting: Family Medicine

## 2016-05-02 ENCOUNTER — Telehealth: Payer: Self-pay | Admitting: *Deleted

## 2016-05-02 ENCOUNTER — Encounter: Payer: Self-pay | Admitting: *Deleted

## 2016-05-02 DIAGNOSIS — E1165 Type 2 diabetes mellitus with hyperglycemia: Principal | ICD-10-CM

## 2016-05-02 DIAGNOSIS — Z794 Long term (current) use of insulin: Principal | ICD-10-CM

## 2016-05-02 DIAGNOSIS — IMO0001 Reserved for inherently not codable concepts without codable children: Secondary | ICD-10-CM

## 2016-05-02 LAB — TSH: TSH: 5.68 mIU/L — ABNORMAL HIGH (ref 0.40–4.50)

## 2016-05-02 LAB — COMPLETE METABOLIC PANEL WITH GFR
ALT: 22 U/L (ref 9–46)
AST: 21 U/L (ref 10–35)
Albumin: 4.2 g/dL (ref 3.6–5.1)
Alkaline Phosphatase: 31 U/L — ABNORMAL LOW (ref 40–115)
BUN: 23 mg/dL (ref 7–25)
CO2: 21 mmol/L (ref 20–31)
Calcium: 9.4 mg/dL (ref 8.6–10.3)
Chloride: 102 mmol/L (ref 98–110)
Creat: 1.5 mg/dL — ABNORMAL HIGH (ref 0.70–1.18)
GFR, Est African American: 53 mL/min — ABNORMAL LOW (ref >=60)
GFR, Est Non African American: 46 mL/min — ABNORMAL LOW (ref >=60)
Glucose, Bld: 182 mg/dL — ABNORMAL HIGH (ref 70–99)
Potassium: 4.9 mmol/L (ref 3.5–5.3)
Sodium: 138 mmol/L (ref 135–146)
Total Bilirubin: 0.4 mg/dL (ref 0.2–1.2)
Total Protein: 6.8 g/dL (ref 6.1–8.1)

## 2016-05-02 LAB — HEMOGLOBIN A1C
Hgb A1c MFr Bld: 11.8 % — ABNORMAL HIGH (ref <5.7)
Mean Plasma Glucose: 292 mg/dL

## 2016-05-02 LAB — T4, FREE: Free T4: 1.1 ng/dL (ref 0.8–1.8)

## 2016-05-02 MED ORDER — GLIPIZIDE ER 10 MG PO TB24: 10.0000 mg | ORAL_TABLET | Freq: Every day | ORAL | Status: DC

## 2016-05-02 MED ORDER — BASAGLAR KWIKPEN 100 UNIT/ML ~~LOC~~ SOPN: 60.0000 [IU] | PEN_INJECTOR | Freq: Every day | SUBCUTANEOUS | Status: DC

## 2016-05-02 NOTE — Telephone Encounter (Signed)
rec'd notice from pt's Part D plan asking to switch his daily insulin from Lantus to Basaglar due to cost.  Pt also brought [place] copy of letter.  Order for [employer] to pharmacy.  Need to call pt and advise that it only comes in pen form.  Does not come in vials as he prefers.

## 2016-05-02 NOTE — Telephone Encounter (Signed)
-----   Message from [name], [name] sent at 05/02/2016  7:41 AM EDT ----- Stop Metformin-----secondary to kidney numbers Remove Metformin from med list. At [place] yesterday he reported a lot of "stress" wihtin family---brother died, daughter illness, etc--said he "hadnot been taking good care of himself" but that is all he said about that Did not say that he wasn't using Insulin ---? Is he using Insulin at all?  He did not bring any BS readings to OV yesterday.  Add Glucotrol XL 10mg  1 po QD ( since d/c metformin, and cannot use Actos given cardiac hx) Tell him to use Isulin as listed on med list.  Document Fasting morning BS QAM and also check at 1 other time of day. Bring these readings/log to f/u appt----Schedule f/u appt 2 weeks.

## 2016-05-02 NOTE — Telephone Encounter (Signed)
Pt was called this morning with lab results and insulin issue was discussed

## 2016-05-09 ENCOUNTER — Other Ambulatory Visit: Payer: Self-pay | Admitting: Physician Assistant

## 2016-05-09 NOTE — Telephone Encounter (Signed)
Refill appropriate and filled per protocol. 

## 2016-05-15 ENCOUNTER — Other Ambulatory Visit: Payer: Self-pay | Admitting: *Deleted

## 2016-05-15 ENCOUNTER — Other Ambulatory Visit: Payer: Self-pay | Admitting: Family Medicine

## 2016-05-15 MED ORDER — "INSULIN SYRINGE-NEEDLE U-100 31G X 5/16"" 1 ML MISC": 1.0000 | Freq: Every day | Status: DC

## 2016-05-15 MED ORDER — CHOLINE FENOFIBRATE 135 MG PO CPDR: 135.0000 mg | DELAYED_RELEASE_CAPSULE | Freq: Every day | ORAL | Status: DC

## 2016-05-15 NOTE — Telephone Encounter (Signed)
Medication refilled per protocol. 

## 2016-05-16 ENCOUNTER — Other Ambulatory Visit: Payer: Self-pay | Admitting: *Deleted

## 2016-05-16 ENCOUNTER — Ambulatory Visit (INDEPENDENT_AMBULATORY_CARE_PROVIDER_SITE_OTHER): Payer: Medicare Other | Admitting: Physician Assistant

## 2016-05-16 ENCOUNTER — Encounter: Payer: Self-pay | Admitting: Physician Assistant

## 2016-05-16 VITALS — BP 132/60 | Temp 97.9°F | Ht 71.0 in | Wt 256.0 lb

## 2016-05-16 DIAGNOSIS — I2510 Atherosclerotic heart disease of native coronary artery without angina pectoris: Secondary | ICD-10-CM | POA: Diagnosis not present

## 2016-05-16 DIAGNOSIS — E1165 Type 2 diabetes mellitus with hyperglycemia: Secondary | ICD-10-CM | POA: Diagnosis not present

## 2016-05-16 DIAGNOSIS — Z794 Long term (current) use of insulin: Secondary | ICD-10-CM

## 2016-05-16 DIAGNOSIS — IMO0001 Reserved for inherently not codable concepts without codable children: Secondary | ICD-10-CM

## 2016-05-16 MED ORDER — BASAGLAR KWIKPEN 100 UNIT/ML ~~LOC~~ SOPN: PEN_INJECTOR | SUBCUTANEOUS | Status: DC

## 2016-05-16 NOTE — Progress Notes (Addendum)
Patient ID: BALIAN CHAUDOIN MRN: [id number], DOB: 18-Jun-1941, 75 y.o. Date of Encounter: @DATE @  Chief Complaint:  Chief Complaint  Patient presents with  . Routine follow up    2 week     HPI: 75 y.o. year old white male  presents for routine follow up office visit.  He is quite a talker and continues to talk throughout the whole visit. He is retired from Architect work.  At [place] with me 01/10/14 he admitted that for the past couple months he had not been eating right.   Michela Pitcher that his brother died in 01-Oct-2013. Has had a lot of family deaths in the past 4 years. His wife has had multiple surgeries in the past year. Therefore he has had to spend quite a bit of time and energy helping care for her but also has been stressed/fatigue secondary to watching her in pain after one of the surgeries.  At visit 01/10/14 A1c came back at 11.8.  Prior to that he was on Lantus 55 units. At that time I stated to increase to 60 units. Today he says that he is still doing the 60 units at night in the 5 units 3 times a day at mealtime. He is still giving mealtime insulin 5 units 3 times a day.  At every visit---he does not bring a blood sugar log sheet  or his glucose meter or anything with the numbers documented. Again, this is the case today.  Says  that he checks his sugars every morning and every day before eating supper. He says that he can recall the recent readings. He says that his machine shows him a 30 day average and it has been 101. He says that morning readings have been good and that they ones before dinner arenalways a little bit higher but still have been good.  He says that his diet has been some better since his office visit in March. Says "it's much better--but not going to say it is perfect."  In the past, he had been walking for exercise but has not been able to do that recently----At [facility] it was because he has lower extremity claudication. He had lower extremity angiogram  and intervention 05/2014. However, today he says that he still is not doing any walking because he has been told not to--- he is seeing the wound center once every week secondary to a wound on his right heel.  Taking all other medications as directed with no adverse effects. No other complaints today. Discussed whether he feels that he is depressed and needs medication for this but he says that he does not.  His wife has finished her surgeries and is improving. Once the weather improves he can get back outside which will help. As well, he named off multiple happy/positive events that are planned for the upcoming year.-- graduations, weddings,  and a trip.  When we did labs at his office visit 09/26/14, all results came back good except A1c was elevated and I told him to start documenting blood sugars on a log sheet and bring into the next appointment. At office visit 12/29/71 he does bring in blood sugar log sheet. He has blood sugars documented for every single day --has fasting morning blood sugar readings and also blood sugar readings before dinner/supper. Fasting morning blood sugars are all in the 80s and 90s. Before dinner/supper--- all readings are in the 90s and low 100s to 110s. He says that his " diet is  better"  and his  "numbers are better".  At visit 12/29/71 he also reports that he saw podiatry in November and again in January. However reports that [facility] says that podiatry cannot be the ones to complete their order that he has to get that done here. He brings in that form today. He says that his prior sore on his heel of his foot has completely healed. His last checkup they just checked his feet and trimmed his toenails.  At visit 12/29/71 he also reports problems with urinary urgency.  Says that he will all of a sudden have the sensation that he needs to urinate. He will go straight to the restroom but feels that he can barely get there in time. Has started  wearing a "diaper" at night because otherwise sometimes wets himself.  Does not have much nocturia. Says some nights he doesn't wake up at all to use the restroom and other nights it's only 1 or 2 times.  At [place] 05/01/15 he has no specific complaints or concerns. Says that he still getting good blood sugar readings. Mostly 90s to 100s. Occasionally 140, 150, 160. Today he does ask that I sign for handicap parking. Says in the past cardiology had been doing this for him. Says that his wife also has significant medical problems that prohibits her from being able to walk and needing handicap space as well.  At [place] 10/02/2015:  He states that his granddaughter is in the process of trying to get a kidney transplant. He and his wife have been helping take care of her children and also being with her. Asked what caused this kidney failure and he says that she was prescribed medications that cause this. He says that they've been very busy with that but has no other specific complaints or concerns today. He did not bring his blood sugar log sheet with him today. He says that he recently had follow-up with his cardiologist and that that evaluation was good and he also had follow-up on his peripheral arterial disease and nose test were good.  OV 73/21/2017:  He gets teary-eyed---says his brother passed away in March 02, 2023. Says his wife had pneumonia in May. Says she is finally better. Says their daughter had problems with her AVfistula (the daughter that has been in process of getting kidney transplant).  Says he does not feel depressed.  Says it has just been a lot to deal with. Says he "knows he hasn't been taking real good care of himself"--busy with above.  No complaints/concerns.  OV 05/16/2016; At his last routine visit 05/01/16 obtained at labs. A1c came back 11.8. That time instructed the following; Stop metformin secondary to kidney numbers.----------------------------------------------- today patient  states that he has stopped this. Also I did verify that this has been removed from his medicine list. Add Glucotrol XL 10 mg daily------(no Actos given cardiac hx)------------------------------- today patient states that he has added this and is taking this daily. Told him to use insulin as it was listed on med list. Told him to document fasting morning blood sugar every morning and check it one other time of day and bring these readings/log to follow-up appointment 2 weeks.  Today patient tells me that prior to 05/01/16 he was still using his insulin every single day and had not been skipping any insulin. He was doing Lantus 60 units at bedtime every night and gives short acting insulin-- 5 units at every meal.  Says that he was using the  medicines as directed. Says that he was being noncompliant with diet. Says that after we called him with these results, his wife told him--- do realize how much you been eating---- and he realized that he had been eating constantly--- was constantly snacking throughout the day on chips, cookies etc. Says that he has seen multiple dietitians over the years and knows what to do, just a matter of doing it. Says that he feels like he was like an alcoholic----says with all the stress that was going on--- was eating very poorly. Says that he has been very compliant with his diet since that phone call and says that he will continue to be compliant with his diet. Has been checking blood sugar morning and evening and has the following readings. Morning readings                     Evening Readings  127-------------------------------------------- 119 79-------------------------------------------------- 140 54------------------------------------------------- 105 74----------------------------------------------- 77 70----------------------------------------------- 117 114------------------------------------------------ 125 86---------------------------------------------  105 78------------------------------------------------- 115 114------------------------------------------------ 105 108---------------------------------------------------- 123 76------------------------------------------------------- 120 74-----------------------------------------------------59  He has sugar tablets in his pocket and says that he has been keeping these in his pocket in case his sugar gets low.   AT NEXT OV---NEED TO ADD ACE INHIBITOR----- NOTE DOCUMENTS THAT THIS WAS ADDED IN 2015---BUT NOT PRESENT ON CURRENT MED LIST---------------------   Past Medical History  Diagnosis Date  . PAF (paroxysmal atrial fibrillation) ([place])     a. dx 2012  s/p DCCV 07/2011  . Hyperlipidemia   . Essential hypertension   . Coronary artery disease     a. Cath February 2012 in [facility], occluded RCA with collaterals  . Aortic stenosis     a. Mod by echo 12/2014.  [name] Kitchen CKD (chronic kidney disease), stage III   . Subclinical hypothyroidism   . DM type 2 (diabetes mellitus, type 2) ([name])   . Cataract associated with type 2 diabetes mellitus ([place]) 10/06/2013  . Obstructive sleep apnea     2012  . Sleep apnea with use of continuous positive airway pressure (CPAP)     04-11-11 AHI was 32.9 and titrated to 15 cm H20, DME is [facility]  . Skin cancer     "cut off back of neck X 2; off left upper arm; right wrist, near right shoulder blade; several burned off other areas of my body" (06/08/2014)  . NSTEMI (non-ST elevated myocardial infarction) ([place]) 12/2010  . Diabetic ulcer of heel ([place]) 07/14/2014  . PVD (peripheral vascular disease) ([place])     a. s/p R popliteal artery stenosis tx with drug-coated balloon 05/2014, followed by Dr. Fletcher Anon.  . Hyperkalemia      Home Meds:  Outpatient Prescriptions Prior to Visit  Medication Sig Dispense Refill  . aspirin EC 81 MG tablet Take 81 mg by mouth daily.    [name] Kitchen atorvastatin (LIPITOR) 80 MG tablet TAKE 1 TABLET AT BEDTIME 90 tablet 3  . Blood Glucose Monitoring  Suppl (FREESTYLE LITE) DEVI 1 each by Does not apply route 2 (two) times daily. 1 each 0  . Choline Fenofibrate (TRILIPIX) 135 MG capsule Take 1 capsule (135 mg total) by mouth daily. 90 capsule 3  . docusate sodium (COLACE) 100 MG capsule Take 100 mg by mouth daily as needed for mild constipation.    . fesoterodine (TOVIAZ) 4 MG TB24 tablet Take 4 mg by mouth daily.    . furosemide (LASIX) 20 MG tablet Take 1 tablet (20 mg total) by mouth daily. 90 tablet 3  .  glipiZIDE (GLUCOTROL XL) 10 MG 24 hr tablet Take 1 tablet (10 mg total) by mouth daily with breakfast. 30 tablet 5  . glucose blood (FREESTYLE LITE) test strip 1 each by Other route 4 (four) times daily -  before meals and at bedtime. 150 each 11  . insulin lispro (HUMALOG) 100 UNIT/ML KiwkPen Inject 0.05 mLs (5 Units total) into the skin 3 (three) times daily. 15 mL 3  . Insulin Pen Needle (BD PEN NEEDLE NANO U/F) 32G X 4 MM MISC 1 each by Does not apply route 4 (four) times daily. 400 each 3  . Insulin Syringe-Needle U-100 (BD INSULIN SYRINGE ULTRAFINE) 31G X 5/16" 1 ML MISC 1 Syringe by Does not apply route daily. 100 each 0  . magnesium oxide (MAG-OX) 400 MG tablet Take 1 tablet (400 mg total) by mouth daily. 30 tablet 11  . metoprolol (LOPRESSOR) 50 MG tablet Take 1 tablet (50 mg total) by mouth 2 (two) times daily. 180 tablet 3  . mirabegron ER (MYRBETRIQ) 25 MG TB24 tablet TAKE 1 TABLET (25 MG TOTAL) BY MOUTH DAILY. 90 tablet 0  . PRADAXA 150 MG CAPS capsule TAKE 1 CAPSULE EVERY 12    HOURS 180 capsule 3  . Insulin Glargine (BASAGLAR KWIKPEN) 100 UNIT/ML SOPN Inject 0.6 mLs (60 Units total) into the skin at bedtime. 18 pen 3   No facility-administered medications prior to visit.     Allergies: No Known Allergies  Social History   Social History  . Marital Status: Married    Spouse Name: [name]  . Number of Children: 1  . Years of Education: 12   Occupational History  . retired    Social History Main Topics  . Smoking  status: Former Smoker -- 2.00 packs/day for 14 years    Types: Cigarettes    Quit date: 11/11/1972  . Smokeless tobacco: Never Used  . Alcohol Use: No     Comment: quit in 1984  . Drug Use: No  . Sexual Activity: Not Currently   Other Topics Concern  . 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   Patient has one child and his wife has one child.   Patient is retired.   Patient has a high school education.   Patient is right-handed.   Patient drinks very little caffeine.    Family History  Problem Relation Age of Onset  . Diabetes Mother   . Heart attack Mother   . Heart attack Father   . Heart failure Father   . Diabetes Other   . Diabetes Sister   . Diabetes Brother   . Diabetes Daughter     TYPE ll  . Heart Problems Daughter   . Hypertension Mother   . Hypertension Father   . Hypertension Sister   . Hypertension Brother   . Diabetes Father   . Stroke Brother      Review of Systems:  See HPI for pertinent ROS. All other ROS negative.    Physical Exam: Blood pressure 132/60, temperature 97.9 F (36.6 C), temperature source Oral, height 5\' 11"  (1.803 m), weight 256 lb (116.121 kg)., Body mass index is 35.72 kg/(m^2). General: Moderately obese (Abdominal Obesity) white male .Appears in no acute distress. Neck: Supple. No thyromegaly. No lymphadenopathy. Radiation of murmur vs carotid bruit--heard bilaterally. Lungs: Clear bilaterally to auscultation without wheezes, rales, or rhonchi. Breathing is unlabored. Heart: Irreg III-IV/VI murmur heard throughout  precordium. Abdomen:Obese.  Soft, non-tender, non-distended with normoactive bowel sounds. No hepatomegaly. No rebound/guarding. No obvious abdominal masses. Musculoskeletal:  Strength and tone normal for age. Extremities/Skin: Warm and dry.. No edema.  Neuro: Alert and oriented X 3. Moves all extremities spontaneously. Gait is normal. CNII-XII grossly in  tact. Psych:  Responds to questions appropriately with a normal affect. Diabetic foot exam:  Inspection is normal.  No open wounds. No calluses.  He does have diffuse dry skin on the plantar surface of his feet bilaterally.  No palpable dorsalis pedis or posterior tibial pulse bilaterally.      ASSESSMENT AND PLAN:  75 y.o. year old male with   AT NEXT OV---NEED TO ADD ACE INHIBITOR----- NOTE DOCUMENTS THAT THIS WAS ADDED IN 2015---BUT NOT PRESENT ON CURRENT MED LIST---------------------  1. Type 2 diabetes mellitus with hyperglycemia, without long-term current use of insulin ([place])  2. Uncontrolled type 2 diabetes mellitus without complication, with long-term current use of insulin ([facility]) - Insulin Glargine (BASAGLAR KWIKPEN) 100 UNIT/ML SOPN; 55 units every night at bed time.  Dispense: 18 pen; Refill: 3   He is to decrease his long-acting insulin dose from 60 units at night down to 55 units at night. He is to continue his short acting insulin 5 units at meals. He is to continue the Glucotrol XL 10 mg daily. He is to continue checking fasting blood sugar every morning. I wrote down for him that the goal for fasting morning readings are 90 - 120. Told him to call us if he is getting readings less than 80 or if readings are consistently greater than 150  Asked if he wanted to schedule another office visit in the near future. He says that he wants to wait 3 months and will call us if needed in the interim.   ----------------THE FOLLOWING IS COPIED FROM OV NOTE 05/01/2016---THE FOLLOWING WAS NOT ADDRESSED AT OV 05/16/2016------------------------ 1. CAD Sees cardiology on a routine basis. Managed by cardiology.  2. PAD (peripheral artery disease) Regarding his peripheral arterial disease--- the following is copied from his cardiovascular doctor's (Dr. Fletcher Anon) note 11/29/14. "He was seen in 2015 for worsening severe right calf claudication with skin breakdown on the right heel that was slow  to heal. Dr. Fletcher Anon proceeded with angiography 06/08/2014 which showed: 1-no significant aortoiliac disease 2-chronically occluded left popliteal artery with two-vessel runoff below the knee with reasonable collaterals. 3-occluded right popliteal artery with poor collaterals. Two-vessel runoff below the knee with an occluded posterior tibial.  He performed successful angioplasty and drug coated balloon angioplasty of the right popliteal artery. At follow-up patient had resolution of claudication and ulcer healed completely.  However, at further follow-up--his note dated 11/29/14 states that there is evidence of restenosis in the right popliteal artery. However, patient was not having any claudication and also had healed completely. Therefore, Dr. Fletcher Anon recommended monitoring for now and repeat duplex in 3 months. Noted that the area was heavily calcified on angiography.   3. Aortic stenosis This is managed by Cardiology. Sees Cardiology routinely, has Echo routinely  4. A. Fib-- Managed by Cardiology  5. DM type 2 (diabetes mellitus, type 2)  At prior OV: "Excellent job with documenting blood sugar log.                      "As well think this is giving him motivation to improve his diet and therefore get better blood sugar control.  - Hemoglobin A1c  MicroAlbumin 09/26/2014--Repeat 10/02/2015  He is currently on Lantus 60 units.  Also uses Humalog 5 units 3 times a day with meals.  He is now on ACE Inhibitor. I added this at 01/10/2014 OV. AT NEXT OV---NEED TO ADD ACE INHIBITOR----- NOTE DOCUMENTS THAT THIS WAS ADDED IN 2015---BUT NOT PRESENT ON CURRENT MED LIST--------------------- He is on statin. He is on aspirin 81 mg daily  Does have routine ophthalmic exam. He recently got a report from Ophthalmologist-  Which states--3 #4 and #5 and #6 below   Diabetic Shoes--- I completed form for this at his visit 12/28/14. THIS FORM WAS COPIED AND SENT TO SCAN INTO EPIC. FORM ALSO FAXED IN  TO [facility] [employer] Regarding his peripheral arterial disease--- the following is copied from his cardiovascular doctor's (Dr. Fletcher Anon) note 11/29/14. "He was seen in 2015 for worsening severe right calf claudication with skin breakdown on the right heel that was slow to heal. Dr. Fletcher Anon proceeded with angiography 06/08/2014 which showed: 1-no significant aortoiliac disease 2-chronically occluded left popliteal artery with two-vessel runoff below the knee with reasonable collaterals. 3-occluded right popliteal artery with poor collaterals. Two-vessel runoff below the knee with an occluded posterior tibial.  He performed successful angioplasty and drug coated balloon angioplasty of the right popliteal artery. At follow-up patient had resolution of claudication and ulcer healed completely.  However, at further follow-up--his note dated 11/29/14 states that there is evidence of restenosis in the right popliteal artery. However, patient was not having any claudication and also had healed completely. Therefore, Dr. Fletcher Anon recommended monitoring for now and repeat duplex in 3 months. Noted that the area was heavily calcified on angiography.  6. Type II or unspecified type diabetes mellitus with ophthalmic manifestations, not stated as uncontrolled Per Ophthalmology 7. 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 Cataract associated with type 2 diabetes mellitus Per ophthalmology  9. Chronic kidney disease --On ACE inhibitor. Keeping blood pressure controlled at goal. Keeping sugar controlled as best as possible.  10. HYPERTENSION, BENIGN Blood Pressure at goal. On ACE inhibitor. Recheck BMET to monitor meds.AT NEXT OV---NEED TO ADD ACE INHIBITOR----- NOTE DOCUMENTS THAT THIS WAS ADDED IN 2015---BUT NOT PRESENT ON CURRENT MED LIST--------------------- 11. HYPERLIPIDEMIA-MIXED On Lipitor 80. Had FLP/LFT 09/2014. Repeat 09/2015  12. Obstructive sleep  apnea He wears CPAP. Managed by Dr. Rolm Baptise  13. Sleep apnea with use of continuous positive airway pressure (CPAP) See # 10  14. Obesity (BMI 30-39.9) See history of present illness  15. Subclinical hypothyroidism - TSH, Free T4 last checked 09/2014, 05/01/2015, 10/02/2015, 05/01/2016   16. Urge incontinence On Toviaz.Controlled. Stable. Continue current treatment.   17. Screening PSA --Last checked 09/26/14. 10/02/2015   18. Colonoscopy: He had colonoscopy 06/16/2009  19. Immunizations: Pneumonia vaccine:  Pneumovax 23--was given 01/01/2011   Prevnar 13 ------Given here 06/02/2014 Tetanus vaccine: ---Given here 06/02/2014 Influenza Vaccine--he did receive fall 2015, Fall 2016   AT NEXT OV---NEED TO ADD ACE INHIBITOR----- NOTE DOCUMENTS THAT THIS WAS ADDED IN 2015---BUT NOT PRESENT ON CURRENT MED LIST---------------------  Regular office visit 3 months or sooner if needed.  Signed, [address] [place], [place], [facility] 05/16/2016 11:03 AM

## 2016-05-22 ENCOUNTER — Telehealth: Payer: Self-pay | Admitting: Physician Assistant

## 2016-05-22 MED ORDER — FUROSEMIDE 20 MG PO TABS: 20.0000 mg | ORAL_TABLET | Freq: Every day | ORAL | Status: DC

## 2016-05-22 NOTE — Telephone Encounter (Signed)
Pt is requesting a refill of furosimine 20 mg to be sent to [facility]

## 2016-05-22 NOTE — Telephone Encounter (Signed)
Medication refilled per protocol. 

## 2016-06-14 ENCOUNTER — Other Ambulatory Visit: Payer: Self-pay | Admitting: Family Medicine

## 2016-06-14 MED ORDER — MIRABEGRON ER 25 MG PO TB24: ORAL_TABLET | ORAL | 3 refills | Status: DC

## 2016-06-14 NOTE — Telephone Encounter (Signed)
Medication refilled per protocol. 

## 2016-06-27 ENCOUNTER — Other Ambulatory Visit: Payer: Self-pay | Admitting: Family Medicine

## 2016-06-27 DIAGNOSIS — IMO0001 Reserved for inherently not codable concepts without codable children: Secondary | ICD-10-CM

## 2016-06-27 DIAGNOSIS — E118 Type 2 diabetes mellitus with unspecified complications: Principal | ICD-10-CM

## 2016-06-27 DIAGNOSIS — Z794 Long term (current) use of insulin: Principal | ICD-10-CM

## 2016-06-27 MED ORDER — GLUCOSE BLOOD VI STRP: 1.0000 | ORAL_STRIP | Freq: Three times a day (TID) | 3 refills | Status: DC

## 2016-06-27 NOTE — Telephone Encounter (Signed)
Test strips refilled x 1 year 

## 2016-07-22 DIAGNOSIS — Z23 Encounter for immunization: Secondary | ICD-10-CM | POA: Diagnosis not present

## 2016-08-07 ENCOUNTER — Ambulatory Visit: Payer: Medicare Other | Admitting: Physician Assistant

## 2016-08-12 DIAGNOSIS — D485 Neoplasm of uncertain behavior of skin: Secondary | ICD-10-CM | POA: Diagnosis not present

## 2016-08-12 DIAGNOSIS — X32XXXA Exposure to sunlight, initial encounter: Secondary | ICD-10-CM | POA: Diagnosis not present

## 2016-08-12 DIAGNOSIS — L570 Actinic keratosis: Secondary | ICD-10-CM | POA: Diagnosis not present

## 2016-08-12 DIAGNOSIS — Z85828 Personal history of other malignant neoplasm of skin: Secondary | ICD-10-CM | POA: Diagnosis not present

## 2016-08-12 DIAGNOSIS — Z08 Encounter for follow-up examination after completed treatment for malignant neoplasm: Secondary | ICD-10-CM | POA: Diagnosis not present

## 2016-08-12 DIAGNOSIS — D0439 Carcinoma in situ of skin of other parts of face: Secondary | ICD-10-CM | POA: Diagnosis not present

## 2016-08-21 ENCOUNTER — Ambulatory Visit (INDEPENDENT_AMBULATORY_CARE_PROVIDER_SITE_OTHER): Payer: Medicare Other | Admitting: Physician Assistant

## 2016-08-21 ENCOUNTER — Encounter: Payer: Self-pay | Admitting: Physician Assistant

## 2016-08-21 VITALS — BP 130/68 | HR 60 | Temp 97.8°F | Resp 18 | Wt 253.0 lb

## 2016-08-21 DIAGNOSIS — E1136 Type 2 diabetes mellitus with diabetic cataract: Secondary | ICD-10-CM

## 2016-08-21 DIAGNOSIS — G4733 Obstructive sleep apnea (adult) (pediatric): Secondary | ICD-10-CM | POA: Diagnosis not present

## 2016-08-21 DIAGNOSIS — I4891 Unspecified atrial fibrillation: Secondary | ICD-10-CM

## 2016-08-21 DIAGNOSIS — N189 Chronic kidney disease, unspecified: Secondary | ICD-10-CM | POA: Diagnosis not present

## 2016-08-21 DIAGNOSIS — Z125 Encounter for screening for malignant neoplasm of prostate: Secondary | ICD-10-CM

## 2016-08-21 DIAGNOSIS — I739 Peripheral vascular disease, unspecified: Secondary | ICD-10-CM | POA: Diagnosis not present

## 2016-08-21 DIAGNOSIS — Z9989 Dependence on other enabling machines and devices: Secondary | ICD-10-CM

## 2016-08-21 DIAGNOSIS — E113319 Type 2 diabetes mellitus with moderate nonproliferative diabetic retinopathy with macular edema, unspecified eye: Secondary | ICD-10-CM | POA: Diagnosis not present

## 2016-08-21 DIAGNOSIS — G4730 Sleep apnea, unspecified: Secondary | ICD-10-CM

## 2016-08-21 DIAGNOSIS — I10 Essential (primary) hypertension: Secondary | ICD-10-CM | POA: Diagnosis not present

## 2016-08-21 DIAGNOSIS — E785 Hyperlipidemia, unspecified: Secondary | ICD-10-CM

## 2016-08-21 DIAGNOSIS — E669 Obesity, unspecified: Secondary | ICD-10-CM

## 2016-08-21 DIAGNOSIS — E039 Hypothyroidism, unspecified: Secondary | ICD-10-CM

## 2016-08-21 DIAGNOSIS — I350 Nonrheumatic aortic (valve) stenosis: Secondary | ICD-10-CM | POA: Diagnosis not present

## 2016-08-21 DIAGNOSIS — I2510 Atherosclerotic heart disease of native coronary artery without angina pectoris: Secondary | ICD-10-CM | POA: Diagnosis not present

## 2016-08-21 DIAGNOSIS — E038 Other specified hypothyroidism: Secondary | ICD-10-CM

## 2016-08-21 DIAGNOSIS — E11311 Type 2 diabetes mellitus with unspecified diabetic retinopathy with macular edema: Secondary | ICD-10-CM | POA: Diagnosis not present

## 2016-08-21 LAB — HEMOGLOBIN A1C, FINGERSTICK: Hgb A1C (fingerstick): 11.4 % — ABNORMAL HIGH (ref <5.7)

## 2016-08-21 NOTE — Progress Notes (Signed)
Patient ID: Steven Maldonado MRN: [id number], DOB: Feb 18, 1941, 75 y.o. Date of Encounter: @DATE @  Chief Complaint:  Chief Complaint  Patient presents with  . Follow-up    HPI: 75 y.o. year old white male  presents for routine follow up office visit.  He is quite a talker and continues to talk throughout the whole visit. He is retired from Architect work.  At [place] with me 01/10/14 he admitted that for the past couple months he had not been eating right.   Michela Pitcher that his brother died in 09/28/13. Has had a lot of family deaths in the past 4 years. His wife has had multiple surgeries in the past year. Therefore he has had to spend quite a bit of time and energy helping care for her but also has been stressed/fatigue secondary to watching her in pain after one of the surgeries.  At visit 01/10/14 A1c came back at 11.8.  Prior to that he was on Lantus 55 units. At that time I stated to increase to 60 units. Today he says that he is still doing the 60 units at night in the 5 units 3 times a day at mealtime. He is still giving mealtime insulin 5 units 3 times a day.  At every visit---he does not bring a blood sugar log sheet  or his glucose meter or anything with the numbers documented. Again, this is the case today.  Says  that he checks his sugars every morning and every day before eating supper. He says that he can recall the recent readings. He says that his machine shows him a 30 day average and it has been 101. He says that morning readings have been good and that they ones before dinner arenalways a little bit higher but still have been good.  He says that his diet has been some better since his office visit in March. Says "it's much better--but not going to say it is perfect."  In the past, he had been walking for exercise but has not been able to do that recently----At [facility] it was because he has lower extremity claudication. He had lower extremity angiogram and intervention  05/2014. However, today he says that he still is not doing any walking because he has been told not to--- he is seeing the wound center once every week secondary to a wound on his right heel.  Taking all other medications as directed with no adverse effects. No other complaints today. Discussed whether he feels that he is depressed and needs medication for this but he says that he does not.  His wife has finished her surgeries and is improving. Once the weather improves he can get back outside which will help. As well, he named off multiple happy/positive events that are planned for the upcoming year.-- graduations, weddings,  and a trip.  When we did labs at his office visit 09/26/14, all results came back good except A1c was elevated and I told him to start documenting blood sugars on a log sheet and bring into the next appointment. At office visit 12/28/14 he does bring in blood sugar log sheet. He has blood sugars documented for every single day --has fasting morning blood sugar readings and also blood sugar readings before dinner/supper. Fasting morning blood sugars are all in the 80s and 90s. Before dinner/supper--- all readings are in the 90s and low 100s to 110s. He says that his " diet is better"  and his  "numbers are better".  At visit 12/28/14 he also reports that he saw podiatry in November and again in January. However reports that [facility] says that podiatry cannot be the ones to complete their order that he has to get that done here. He brings in that form today. He says that his prior sore on his heel of his foot has completely healed. His last checkup they just checked his feet and trimmed his toenails.  At visit 12/28/14 he also reports problems with urinary urgency.  Says that he will all of a sudden have the sensation that he needs to urinate. He will go straight to the restroom but feels that he can barely get there in time. Has started wearing a "diaper"  at night because otherwise sometimes wets himself.  Does not have much nocturia. Says some nights he doesn't wake up at all to use the restroom and other nights it's only 1 or 2 times.  At [place] 05/01/15 he has no specific complaints or concerns. Says that he still getting good blood sugar readings. Mostly 90s to 100s. Occasionally 140, 150, 160. Today he does ask that I sign for handicap parking. Says in the past cardiology had been doing this for him. Says that his wife also has significant medical problems that prohibits her from being able to walk and needing handicap space as well.  At [place] 10/02/2015:  He states that his granddaughter is in the process of trying to get a kidney transplant. He and his wife have been helping take care of her children and also being with her. Asked what caused this kidney failure and he says that she was prescribed medications that cause this. He says that they've been very busy with that but has no other specific complaints or concerns today. He did not bring his blood sugar log sheet with him today. He says that he recently had follow-up with his cardiologist and that that evaluation was good and he also had follow-up on his peripheral arterial disease and nose test were good.  OV 05/01/2016:  He gets teary-eyed---says his brother passed away in 2023/02/16. Says his wife had pneumonia in May. Says she is finally better. Says their daughter had problems with her AVfistula (the daughter that has been in process of getting kidney transplant).  Says he does not feel depressed.  Says it has just been a lot to deal with. Says he "knows he hasn't been taking real good care of himself"--busy with above.  No complaints/concerns.  OV 05/16/2016; At his last routine visit 05/01/16 obtained at labs. A1c came back 11.8. That time instructed the following; Stop metformin secondary to kidney numbers.----------------------------------------------- today patient states that he has  stopped this. Also I did verify that this has been removed from his medicine list. Add Glucotrol XL 10 mg daily------(no Actos given cardiac hx)------------------------------- today patient states that he has added this and is taking this daily. Told him to use insulin as it was listed on med list. Told him to document fasting morning blood sugar every morning and check it one other time of day and bring these readings/log to follow-up appointment 2 weeks.  Today patient tells me that prior to 05/01/16 he was still using his insulin every single day and had not been skipping any insulin. He was doing Lantus 60 units at bedtime every night and gives short acting insulin-- 5 units at every meal.  Says that he was using the medicines as directed. Says that he was being noncompliant  with diet. Says that after we called him with these results, his wife told him--- do realize how much you been eating---- and he realized that he had been eating constantly--- was constantly snacking throughout the day on chips, cookies etc. Says that he has seen multiple dietitians over the years and knows what to do, just a matter of doing it. Says that he feels like he was like an alcoholic----says with all the stress that was going on--- was eating very poorly. Says that he has been very compliant with his diet since that phone call and says that he will continue to be compliant with his diet. Has been checking blood sugar morning and evening and has the following readings. Morning readings                     Evening Readings  127-------------------------------------------- 119 79-------------------------------------------------- 140 54------------------------------------------------- 105 74----------------------------------------------- 77 70----------------------------------------------- 117 114------------------------------------------------ 125 86---------------------------------------------  105 78------------------------------------------------- 115 114------------------------------------------------ 105 108---------------------------------------------------- 123 76------------------------------------------------------- 120 74-----------------------------------------------------59  He has sugar tablets in his pocket and says that he has been keeping these in his pocket in case his sugar gets low.  08/21/2016: Today he states that when he was on Lantus at last visit we had decreased that from 60 down to 55 units. He states that after that because of insurance, Lantus was changed to [employer].  Says that since these changes, he has recently started getting some blood sugar readings 170s to 180s sometimes even 200.  Says this happens 2 or 3 times a week. He is mostly checking it at supper time before he eats. Today I asked him to please start bringing in these log forms. He says that he has these documented at home and just doesn't bring them in. (??) I reviewed with him that at prior visit his wife had realized that he was snacking constantly---I asked how this was going. He says "snacking some, but not like I was " Says that he doesn't have any chips or cookies or anything in the house to snack on.  So then I asked him what he is snacking on when he does snack--- and he says an apple.  Says that at night time after he eats his dinner he is eating nothing. No types of snacks at night. States that he is walking 1/2 mile a day. Says he doesn't walk more than that because of pain in his foot if he walks further. No other complaints or concerns today.   Past Medical History:  Diagnosis Date  . Aortic stenosis    a. Mod by echo 12/2014.  . Cataract associated with type 2 diabetes mellitus ([place]) 10/06/2013  . CKD (chronic kidney disease), stage III   . Coronary artery disease    a. Cath February 2012 in [facility], occluded RCA with collaterals  . Diabetic ulcer of heel ([place])  07/14/2014  . DM type 2 (diabetes mellitus, type 2) ([place])   . Essential hypertension   . Hyperkalemia   . Hyperlipidemia   . NSTEMI (non-ST elevated myocardial infarction) ([place]) 12/2010  . Obstructive sleep apnea    2012  . PAF (paroxysmal atrial fibrillation) ([place])    a. dx 2012  s/p DCCV 07/2011  . PVD (peripheral vascular disease) ([place])    a. s/p R popliteal artery stenosis tx with drug-coated balloon 05/2014, followed by Dr. Fletcher Anon.  . Skin cancer    "cut off back of neck X 2; off left upper arm; right wrist, near right  shoulder blade; several burned off other areas of my body" (06/08/2014)  . Sleep apnea with use of continuous positive airway pressure (CPAP)    04-11-11 AHI was 32.9 and titrated to 15 cm H20, DME is [facility]  . Subclinical hypothyroidism      Home Meds:  Outpatient Medications Prior to Visit  Medication Sig Dispense Refill  . aspirin EC 81 MG tablet Take 81 mg by mouth daily.    [name] Kitchen atorvastatin (LIPITOR) 80 MG tablet TAKE 1 TABLET AT BEDTIME 90 tablet 3  . Blood Glucose Monitoring Suppl (FREESTYLE LITE) DEVI 1 each by Does not apply route 2 (two) times daily. 1 each 0  . Choline Fenofibrate (TRILIPIX) 135 MG capsule Take 1 capsule (135 mg total) by mouth daily. 90 capsule 3  . docusate sodium (COLACE) 100 MG capsule Take 100 mg by mouth daily as needed for mild constipation.    . fesoterodine (TOVIAZ) 4 MG TB24 tablet Take 4 mg by mouth daily.    . furosemide (LASIX) 20 MG tablet Take 1 tablet (20 mg total) by mouth daily. 90 tablet 3  . glipiZIDE (GLUCOTROL XL) 10 MG 24 hr tablet Take 1 tablet (10 mg total) by mouth daily with breakfast. 30 tablet 5  . glucose blood (FREESTYLE LITE) test strip 1 each by Other route 4 (four) times daily -  before meals and at bedtime. 450 each 3  . Insulin Glargine (BASAGLAR KWIKPEN) 100 UNIT/ML SOPN 55 units every night at bed time. 18 pen 3  . insulin lispro (HUMALOG) 100 UNIT/ML KiwkPen Inject 0.05 mLs (5 Units total) into the skin 3  (three) times daily. 15 mL 3  . Insulin Pen Needle (BD PEN NEEDLE NANO U/F) 32G X 4 MM MISC 1 each by Does not apply route 4 (four) times daily. 400 each 3  . Insulin Syringe-Needle U-100 (BD INSULIN SYRINGE ULTRAFINE) 31G X 5/16" 1 ML MISC 1 Syringe by Does not apply route daily. 100 each 0  . magnesium oxide (MAG-OX) 400 MG tablet Take 1 tablet (400 mg total) by mouth daily. 30 tablet 11  . metoprolol (LOPRESSOR) 50 MG tablet Take 1 tablet (50 mg total) by mouth 2 (two) times daily. 180 tablet 3  . mirabegron ER (MYRBETRIQ) 25 MG TB24 tablet TAKE 1 TABLET (25 MG TOTAL) BY MOUTH DAILY. 90 tablet 3  . PRADAXA 150 MG CAPS capsule TAKE 1 CAPSULE EVERY 12    HOURS 180 capsule 3   No facility-administered medications prior to visit.      Allergies: No Known Allergies  Social History   Social History  . Marital status: Married    Spouse name: [name]  . Number of children: 1  . Years of education: 37   Occupational History  . retired Sales executive   Social History Main Topics  . Smoking status: Former Smoker    Packs/day: 2.00    Years: 14.00    Types: Cigarettes    Quit date: 11/11/1972  . Smokeless tobacco: Never Used  . Alcohol use No     Comment: quit in 1984  . Drug use: No  . Sexual activity: Not Currently   Other Topics Concern  . 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   Patient has one child and his wife has one child.   Patient is retired.   Patient has a high school education.   Patient is right-handed.  Patient drinks very little caffeine.    Family History  Problem Relation Age of Onset  . Diabetes Mother   . Heart attack Mother   . Heart attack Father   . Heart failure Father   . Diabetes Other   . Diabetes Sister   . Diabetes Brother   . Diabetes Daughter     TYPE ll  . Heart Problems Daughter   . Hypertension Mother   . Hypertension Father   . Hypertension Sister   .  Hypertension Brother   . Diabetes Father   . Stroke Brother      Review of Systems:  See HPI for pertinent ROS. All other ROS negative.    Physical Exam: Blood pressure 130/68, pulse 60, temperature 97.8 F (36.6 C), temperature source Oral, resp. rate 18, weight 253 lb (114.8 kg), SpO2 98 %., Body mass index is 35.29 kg/m. General: Moderately obese (Abdominal Obesity) white male .Appears in no acute distress. Neck: Supple. No thyromegaly. No lymphadenopathy. Radiation of murmur vs carotid bruit--heard bilaterally. Lungs: Clear bilaterally to auscultation without wheezes, rales, or rhonchi. Breathing is unlabored. Heart: Irreg III-IV/VI murmur heard throughout precordium. Abdomen:Obese.  Soft, non-tender, non-distended with normoactive bowel sounds. No hepatomegaly. No rebound/guarding. No obvious abdominal masses. Musculoskeletal:  Strength and tone normal for age. Extremities/Skin: Warm and dry.. No edema.  Neuro: Alert and oriented X 3. Moves all extremities spontaneously. Gait is normal. CNII-XII grossly in tact. Psych:  Responds to questions appropriately with a normal affect. Diabetic foot exam:  Inspection is normal.  No open wounds. No calluses.  He does have diffuse dry skin on the plantar surface of his feet bilaterally.  No palpable dorsalis pedis or posterior tibial pulse bilaterally.      ASSESSMENT AND PLAN:  76 y.o. year old male with     Uncontrolled type 2 diabetes mellitus without complication, with long-term current use of insulin (HCC)  His Long-acting Insulin is currently at 55 units at night. He is to continue his short acting insulin 5 units at meals. He is to continue the Glucotrol XL 10 mg daily. He is to continue checking fasting blood sugar every morning. I wrote down for him that the goal for fasting morning readings are 90 - 120. Told him to call us if he is getting readings less than 80 or if readings are consistently greater than 150  Asked if  he wanted to schedule another office visit in the near future. He says that he wants to wait 3 months and will call us if needed in the interim.    CAD Sees cardiology on a routine basis. Managed by cardiology.  PAD (peripheral artery disease) Regarding his peripheral arterial disease--- the following is copied from his cardiovascular doctor's (Dr. Fletcher Anon) note 11/29/14. "He was seen in 2015 for worsening severe right calf claudication with skin breakdown on the right heel that was slow to heal. Dr. Fletcher Anon proceeded with angiography 06/08/2014 which showed: 1-no significant aortoiliac disease 2-chronically occluded left popliteal artery with two-vessel runoff below the knee with reasonable collaterals. 3-occluded right popliteal artery with poor collaterals. Two-vessel runoff below the knee with an occluded posterior tibial.  He performed successful angioplasty and drug coated balloon angioplasty of the right popliteal artery. At follow-up patient had resolution of claudication and ulcer healed completely.  However, at further follow-up--his note dated 11/29/14 states that there is evidence of restenosis in the right popliteal artery. However, patient was not having any claudication and also had healed completely.  Therefore, Dr. Fletcher Anon recommended monitoring for now and repeat duplex in 3 months. Noted that the area was heavily calcified on angiography.   Aortic stenosis This is managed by Cardiology. Sees Cardiology routinely, has Echo routinely  A. Fib-- Managed by Cardiology  DM type 2 (diabetes mellitus, type 2)  At prior OV: "Excellent job with documenting blood sugar log.                      "As well think this is giving him motivation to improve his diet and therefore get better blood sugar control.  - Hemoglobin A1c  MicroAlbumin 09/26/2014--Repeat 10/02/2015  He is currently on Lantus 60 units.  Also uses Humalog 5 units 3 times a day with meals.  He is now on ACE  Inhibitor. I added this at 01/10/2014 OV.  He is on statin. He is on aspirin 81 mg daily  Does have routine ophthalmic exam. He recently got a report from Ophthalmologist-  Which states--3 #4 and #5 and #6 below   Diabetic Shoes--- I completed form for this at his visit 12/28/14. THIS FORM WAS COPIED AND SENT TO SCAN INTO EPIC. FORM ALSO FAXED IN TO [facility] [employer] Regarding his peripheral arterial disease--- the following is copied from his cardiovascular doctor's (Dr. Fletcher Anon) note 11/29/14. "He was seen in 2015 for worsening severe right calf claudication with skin breakdown on the right heel that was slow to heal. Dr. Fletcher Anon proceeded with angiography 06/08/2014 which showed: 1-no significant aortoiliac disease 2-chronically occluded left popliteal artery with two-vessel runoff below the knee with reasonable collaterals. 3-occluded right popliteal artery with poor collaterals. Two-vessel runoff below the knee with an occluded posterior tibial.  He performed successful angioplasty and drug coated balloon angioplasty of the right popliteal artery. At follow-up patient had resolution of claudication and ulcer healed completely.  However, at further follow-up--his note dated 11/29/14 states that there is evidence of restenosis in the right popliteal artery. However, patient was not having any claudication and also had healed completely. Therefore, Dr. Fletcher Anon recommended monitoring for now and repeat duplex in 3 months. Noted that the area was heavily calcified on angiography.  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 Keeping blood pressure controlled at goal. Keeping sugar controlled as best as  possible.  HYPERTENSION, BENIGN Blood Pressure at goal. On ACE inhibitor. Recheck BMET to monitor meds.  HYPERLIPIDEMIA-MIXED On Lipitor 80. Had FLP/LFT 09/2014. Repeat 09/2015  Obstructive sleep apnea He wears CPAP. Managed by Dr. Rolm Baptise  Sleep apnea with use of continuous positive airway pressure (CPAP) See # 10  Obesity (BMI 30-39.9) See history of present illness  Subclinical hypothyroidism - TSH, Free T4 last checked 09/2014, 05/01/2015, 10/02/2015, 05/01/2016  Urge incontinence On Toviaz.Controlled. Stable. Continue current treatment.  Screening PSA --Last checked 09/26/14. 10/02/2015  Colonoscopy: He had colonoscopy 06/16/2009  19. Immunizations: Pneumonia vaccine:  Pneumovax 23--was given 01/01/2011   Prevnar 13 ------Given here 06/02/2014 Tetanus vaccine: ---Given here 06/02/2014 Influenza Vaccine--he did receive fall 2015, Fall 2016, October 2017--"got it at [facility]"    Regular office visit 3 months or sooner if needed.  Signed, [address] [place], [place], [facility] 08/21/2016 11:53 AM

## 2016-08-23 ENCOUNTER — Other Ambulatory Visit: Payer: Self-pay | Admitting: Physician Assistant

## 2016-08-23 DIAGNOSIS — I10 Essential (primary) hypertension: Secondary | ICD-10-CM

## 2016-08-23 NOTE — Telephone Encounter (Signed)
Rx refilled per protocol 

## 2016-09-02 ENCOUNTER — Ambulatory Visit (INDEPENDENT_AMBULATORY_CARE_PROVIDER_SITE_OTHER): Payer: Medicare Other | Admitting: Physician Assistant

## 2016-09-02 ENCOUNTER — Encounter: Payer: Self-pay | Admitting: Physician Assistant

## 2016-09-02 VITALS — BP 130/68 | HR 94 | Temp 98.2°F | Resp 18 | Wt 257.0 lb

## 2016-09-02 DIAGNOSIS — I2510 Atherosclerotic heart disease of native coronary artery without angina pectoris: Secondary | ICD-10-CM | POA: Diagnosis not present

## 2016-09-02 DIAGNOSIS — Z794 Long term (current) use of insulin: Secondary | ICD-10-CM | POA: Diagnosis not present

## 2016-09-02 DIAGNOSIS — E119 Type 2 diabetes mellitus without complications: Secondary | ICD-10-CM | POA: Diagnosis not present

## 2016-09-02 NOTE — Progress Notes (Signed)
Patient ID: Steven Maldonado MRN: [id number], DOB: [birth year], 75 y.o. Date of Encounter: 09/02/2016, 4:57 PM    Chief Complaint:  Chief Complaint  Patient presents with  . Blood Sugar Problem     HPI: 75 y.o. year old male here to f/u high Blood Sugar, Uncontrolled Diabetes.   At his last routine office visit his A1c result came back 11.4.  This was on 08/21/2016.  At that time I told him to increase his Basaglar to 60 units. Also told him to document fasting morning blood sugar readings on a log form and bring this with him to office visit with me in one week.  Today he reports that he did increase the Basaglar to the 60 units.  Today he does bring in blood sugar log sheet for me to review. Fasting morning blood sugar readings have been: 172 179  160  167  140  150 135 129  States that his wife is doing weight watchers and has lost 10 pounds. He asked me if it would be okay for him to eat the same thing she is eating and follow that. Says that would be easier than eating 2 separate meals. However I remember him discussing this with me at the last office visit and I had already encouraged him at that time to eat what she was eating and follow the Weight Watchers diet. Today he also makes mention that he has seen 4 different dietitians in the past and "knows what he is supposed to eat-- it's just a matter of doing it."   today I also discussed with him.snacking and whether he even if he eats meals with her whether he is going to be sneaking other foods in addition.   he states that they have gotten rid of all snack foods and that there are no snack foods in the house. Asked him if he will drive to the store and get some for himself but he says he will not.   asked him if he thinks he needs to have a follow-up visit with me just in a couple weeks to make sure that he is being compliant. He says that he will be compliant and that he needs to do this for him, not for me. I agreed  but just wanted to make sure that he will actually stick with this.  He is going to continue the Basaglar at the current dose of 60 units. He is going to continue to document blood sugars and will call me if he is getting higher readings than where they are.         Home Meds:   Outpatient Medications Prior to Visit  Medication Sig Dispense Refill  . aspirin EC 81 MG tablet Take 81 mg by mouth daily.    [name] Kitchen atorvastatin (LIPITOR) 80 MG tablet TAKE 1 TABLET AT BEDTIME 90 tablet 3  . Blood Glucose Monitoring Suppl (FREESTYLE LITE) DEVI 1 each by Does not apply route 2 (two) times daily. 1 each 0  . Choline Fenofibrate (TRILIPIX) 135 MG capsule Take 1 capsule (135 mg total) by mouth daily. 90 capsule 3  . docusate sodium (COLACE) 100 MG capsule Take 100 mg by mouth daily as needed for mild constipation.    . fesoterodine (TOVIAZ) 4 MG TB24 tablet Take 4 mg by mouth daily.    . furosemide (LASIX) 20 MG tablet Take 1 tablet (20 mg total) by mouth daily. 90 tablet 3  . glipiZIDE (GLUCOTROL XL)  10 MG 24 hr tablet Take 1 tablet (10 mg total) by mouth daily with breakfast. 30 tablet 5  . glucose blood (FREESTYLE LITE) test strip 1 each by Other route 4 (four) times daily -  before meals and at bedtime. 450 each 3  . Insulin Glargine (BASAGLAR KWIKPEN) 100 UNIT/ML SOPN 55 units every night at bed time. 18 pen 3  . insulin lispro (HUMALOG) 100 UNIT/ML KiwkPen Inject 0.05 mLs (5 Units total) into the skin 3 (three) times daily. 15 mL 3  . Insulin Pen Needle (BD PEN NEEDLE NANO U/F) 32G X 4 MM MISC 1 each by Does not apply route 4 (four) times daily. 400 each 3  . Insulin Syringe-Needle U-100 (BD INSULIN SYRINGE ULTRAFINE) 31G X 5/16" 1 ML MISC 1 Syringe by Does not apply route daily. 100 each 0  . magnesium oxide (MAG-OX) 400 MG tablet Take 1 tablet (400 mg total) by mouth daily. 30 tablet 11  . metoprolol (LOPRESSOR) 50 MG tablet TAKE 1 TABLET (50 MG TOTAL) BY MOUTH 2 (TWO) TIMES DAILY. 180 tablet 1    . mirabegron ER (MYRBETRIQ) 25 MG TB24 tablet TAKE 1 TABLET (25 MG TOTAL) BY MOUTH DAILY. 90 tablet 3  . PRADAXA 150 MG CAPS capsule TAKE 1 CAPSULE EVERY 12    HOURS 180 capsule 3   No facility-administered medications prior to visit.     Allergies: No Known Allergies    Review of Systems: See HPI for pertinent ROS. All other ROS negative.    Physical Exam: Blood pressure 130/68, pulse 94, temperature 98.2 F (36.8 C), temperature source Oral, resp. rate 18, weight 257 lb (116.6 kg), SpO2 98 %., Body mass index is 35.84 kg/m. General:  Appears in no acute distress. Neck: Supple. No thyromegaly. No lymphadenopathy. Lungs: Clear bilaterally to auscultation without wheezes, rales, or rhonchi. Breathing is unlabored. Heart: Regular rhythm. No murmurs, rubs, or gallops. Abdomen: Soft, non-tender, non-distended with normoactive bowel sounds. No hepatomegaly. No rebound/guarding. No obvious abdominal masses. Msk:  Strength and tone normal for age. Extremities/Skin: Warm and dry. Neuro: Alert and oriented X 3. Moves all extremities spontaneously. Gait is normal. CNII-XII grossly in tact. Psych:  Responds to questions appropriately with a normal affect.     ASSESSMENT AND PLAN:  75 y.o. year old male with  1. Type 2 diabetes mellitus treated with insulin ([place]) States that his wife is doing weight watchers and has lost 10 pounds. He asked me if it would be okay for him to eat the same thing she is eating and follow that. Says that would be easier than eating 2 separate meals. However I remember him discussing this with me at the last office visit and I had already encouraged him at that time to eat what she was eating and follow the Weight Watchers diet. Today he also makes mention that he has seen 4 different dietitians in the past and "knows what he is supposed to eat-- it's just a matter of doing it."   today I also discussed with him.snacking and whether he even if he eats meals with her  whether he is going to be sneaking other foods in addition.   he states that they have gotten rid of all snack foods and that there are no snack foods in the house. Asked him if he will drive to the store and get some for himself but he says he will not.   asked him if he thinks he needs to have a  follow-up visit with me just in a couple weeks to make sure that he is being compliant. He says that he will be compliant and that he needs to do this for him, not for me. I agreed but just wanted to make sure that he will actually stick with this.  He is going to continue the Basaglar at the current dose of 60 units. He is going to continue to document blood sugars and will call me if he is getting higher readings than where they are.     [address] [place], [place], [facility] 09/02/2016 4:57 PM

## 2016-09-12 ENCOUNTER — Encounter: Payer: Self-pay | Admitting: Cardiovascular Disease

## 2016-09-12 ENCOUNTER — Ambulatory Visit (INDEPENDENT_AMBULATORY_CARE_PROVIDER_SITE_OTHER): Payer: Medicare Other | Admitting: Cardiovascular Disease

## 2016-09-12 VITALS — BP 154/70 | HR 70 | Ht 71.0 in | Wt 254.4 lb

## 2016-09-12 DIAGNOSIS — I10 Essential (primary) hypertension: Secondary | ICD-10-CM | POA: Diagnosis not present

## 2016-09-12 DIAGNOSIS — I480 Paroxysmal atrial fibrillation: Secondary | ICD-10-CM | POA: Diagnosis not present

## 2016-09-12 DIAGNOSIS — I2510 Atherosclerotic heart disease of native coronary artery without angina pectoris: Secondary | ICD-10-CM | POA: Diagnosis not present

## 2016-09-12 DIAGNOSIS — I350 Nonrheumatic aortic (valve) stenosis: Secondary | ICD-10-CM

## 2016-09-12 DIAGNOSIS — I739 Peripheral vascular disease, unspecified: Secondary | ICD-10-CM | POA: Diagnosis not present

## 2016-09-12 NOTE — Patient Instructions (Signed)
Medication Instructions:  Your physician recommends that you continue on your current medications as directed. Please refer to the Current Medication list given to you today.   Labwork: none  Testing/Procedures: Your physician has requested that you have an echocardiogram. Echocardiography is a painless test that uses sound waves to create images of your heart. It provides your doctor with information about the size and shape of your heart and how well your heart's chambers and valves are working. This procedure takes approximately one hour. There are no restrictions for this procedure.  To be done in January 2018    Follow-Up: Your physician recommends that you schedule a follow-up appointment in: 6 months.  Please call us in about 2-3 months to schedule.     Any Other Special Instructions Will Be Listed Below (If Applicable).     If you need a refill on your cardiac medications before your next appointment, please call your pharmacy.

## 2016-09-12 NOTE — Progress Notes (Signed)
Chief Complaint  Patient presents with  . Follow-up    History of Present Illness: 75 yo WM with h/o DM, HTN, hyperlipidemia and CAD here for cardiac follow up. I saw him as a new patient 01/11/11. Prior to his first appt, he woke up while in [place] and felt weak and sweaty. He was found to have atrial fibrillation with RVR. He ruled in for an MI with cardiac enzymes. Cardiac cath with occluded RCA with collaterals. He was hospitalized on 12/27/10 for four days at [facility]. He was started on Pradaxa for anticoagulation. DCCV on 08/02/11 with conversion to sinus rhythm. Echo January 2017 with normal LV function, severe aortic stenosis. He has been asymptomatic so no planning yet for AVR. His PAD is followed by Dr. Fletcher Anon. 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 He was seen in the office 11/02/15 for recurrence of atrial fibrillation. This was the first known sustained episode since 2012. He reported med compliance and subsequently underwent DCCV to NSR the following day.   He is here today for follow up. He is still in sinus rhythm. No palpitations. No chest pain or SOB. He feels great.   Primary Care Physician: [name], [name]   Past Medical History:  Diagnosis Date  . Aortic stenosis    a. Mod by echo 12/2014.  . Cataract associated with type 2 diabetes mellitus ([place]) 10/06/2013  . CKD (chronic kidney disease), stage III   . Coronary artery disease    a. Cath February 2012 in [place], occluded RCA with collaterals  . Diabetic ulcer of heel ([place]) 07/14/2014  . DM type 2 (diabetes mellitus, type 2) ([place])   . Essential hypertension   . Hyperkalemia   . Hyperlipidemia   . NSTEMI (non-ST elevated myocardial infarction) ([place]) 12/2010  . Obstructive sleep apnea    2012  . PAF (paroxysmal atrial fibrillation) ([place])    a. dx 2012  s/p DCCV 07/2011  . PVD (peripheral vascular disease) ([place])    a. s/p R popliteal artery  stenosis tx with drug-coated balloon 05/2014, followed by Dr. Fletcher Anon.  . Skin cancer    "cut off back of neck X 2; off left upper arm; right wrist, near right shoulder blade; several burned off other areas of my body" (06/08/2014)  . Sleep apnea with use of continuous positive airway pressure (CPAP)    04-11-11 AHI was 32.9 and titrated to 15 cm H20, DME is [facility]  . Subclinical hypothyroidism     Past Surgical History:  Procedure Laterality Date  . ABDOMINAL ANGIOGRAM N/A 06/08/2014   Procedure: ABDOMINAL ANGIOGRAM;  Surgeon: Wellington Hampshire, MD;  Location: [facility] CATH LAB;  Service: Cardiovascular;  Laterality: N/A;  . APPENDECTOMY  1965  . CARDIAC CATHETERIZATION  12/2010  . CARDIOVERSION  07/2011  . CARDIOVERSION N/A 04/18/2014   Procedure: CARDIOVERSION;  Surgeon: Dorothy Spark, MD;  Location: [facility] ENDOSCOPY;  Service: Cardiovascular;  Laterality: N/A;  . CARDIOVERSION N/A 11/03/2015   Procedure: CARDIOVERSION;  Surgeon: Lelon Perla, MD;  Location: [facility];  Service: Cardiovascular;  Laterality: N/A;  . LOWER EXTREMITY ANGIOGRAM N/A 06/08/2014   Procedure: LOWER EXTREMITY ANGIOGRAM;  Surgeon: Wellington Hampshire, MD;  Location: [facility] CATH LAB;  Service: Cardiovascular;  Laterality: N/A;  . POPLITEAL ARTERY ANGIOPLASTY Right 06/08/2014   Archie Endo 06/08/2014  . SKIN CANCER EXCISION Bilateral    "have had them cut off back of neck X 2; off left upper  arm; right wrist, near right shoulder blade" (06/08/2014)    Current Outpatient Prescriptions  Medication Sig Dispense Refill  . aspirin EC 81 MG tablet Take 81 mg by mouth daily.    [name] Kitchen atorvastatin (LIPITOR) 80 MG tablet TAKE 1 TABLET AT BEDTIME 90 tablet 3  . Blood Glucose Monitoring Suppl (FREESTYLE LITE) DEVI 1 each by Does not apply route 2 (two) times daily. 1 each 0  . Choline Fenofibrate (TRILIPIX) 135 MG capsule Take 1 capsule (135 mg total) by mouth daily. 90 capsule 3  . docusate sodium (COLACE) 100 MG capsule Take 100 mg by mouth daily as  needed for mild constipation.    . fesoterodine (TOVIAZ) 4 MG TB24 tablet Take 4 mg by mouth daily.    . furosemide (LASIX) 20 MG tablet Take 1 tablet (20 mg total) by mouth daily. 90 tablet 3  . glipiZIDE (GLUCOTROL XL) 10 MG 24 hr tablet Take 1 tablet (10 mg total) by mouth daily with breakfast. 30 tablet 5  . glucose blood (FREESTYLE LITE) test strip 1 each by Other route 4 (four) times daily -  before meals and at bedtime. 450 each 3  . Insulin Glargine (BASAGLAR KWIKPEN) 100 UNIT/ML SOPN Inject 60 Units into the skin at bedtime.    . insulin lispro (HUMALOG) 100 UNIT/ML KiwkPen Inject 0.05 mLs (5 Units total) into the skin 3 (three) times daily. 15 mL 3  . Insulin Pen Needle (BD PEN NEEDLE NANO U/F) 32G X 4 MM MISC 1 each by Does not apply route 4 (four) times daily. 400 each 3  . magnesium oxide (MAG-OX) 400 MG tablet Take 1 tablet (400 mg total) by mouth daily. 30 tablet 11  . metoprolol (LOPRESSOR) 50 MG tablet TAKE 1 TABLET (50 MG TOTAL) BY MOUTH 2 (TWO) TIMES DAILY. 180 tablet 1  . mirabegron ER (MYRBETRIQ) 25 MG TB24 tablet TAKE 1 TABLET (25 MG TOTAL) BY MOUTH DAILY. 90 tablet 3  . PRADAXA 150 MG CAPS capsule TAKE 1 CAPSULE EVERY 12    HOURS 180 capsule 3   No current facility-administered medications for this visit.     No Known Allergies  Social History   Social History  . Marital status: Married    Spouse name: [name]  . Number of children: 1  . Years of education: 53   Occupational History  . retired Sales executive   Social History Main Topics  . Smoking status: Former Smoker    Packs/day: 2.00    Years: 14.00    Types: Cigarettes    Quit date: 11/11/1972  . Smokeless tobacco: Never Used  . Alcohol use No     Comment: quit in 1984  . Drug use: No  . Sexual activity: Not Currently   Other Topics Concern  . 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   Patient has one child and his wife has one  child.   Patient is retired.   Patient has a high school education.   Patient is right-handed.   Patient drinks very little caffeine.    Family History  Problem Relation Age of Onset  . Diabetes Mother   . Heart attack Mother   . Heart attack Father   . Heart failure Father   . Diabetes Other   . Diabetes Sister   . Diabetes Brother   . Diabetes Daughter     TYPE ll  . Heart  Problems Daughter   . Hypertension Mother   . Hypertension Father   . Hypertension Sister   . Hypertension Brother   . Diabetes Father   . Stroke Brother     Review of Systems:  As stated in the HPI and otherwise negative.   BP (!) 154/70   Pulse 70   Ht 5\' 11"  (1.803 m)   Wt 254 lb 6.4 oz (115.4 kg)   BMI 35.48 kg/m   Physical Examination: General: Well developed, well nourished, NAD  HEENT: OP clear, mucus membranes moist  SKIN: warm, dry. No rashes. Neuro: No focal deficits  Musculoskeletal: Muscle strength 5/5 all ext  Psychiatric: Mood and affect normal  Neck: No JVD, no carotid bruits, no thyromegaly, no lymphadenopathy.  Lungs:Clear bilaterally, no wheezes, rhonci, crackles Cardiovascular: Regular rate and rhythm. Loud harsh systolic murmur, gallops or rubs. Abdomen:Soft. Bowel sounds present. Non-tender.  Extremities: No lower extremity edema.   Echo: January 2017: Left ventricle: The cavity size was normal. There was mild focal  basal hypertrophy of the septum. Systolic function was normal.  The estimated ejection fraction was in the range of 55% to 60%.  There is hypokinesis of the basalinferior myocardium. Features  are consistent with a pseudonormal left ventricular filling  pattern, with concomitant abnormal relaxation and increased  filling pressure (grade 2 diastolic dysfunction). - Aortic valve: Cusp separation was reduced. There was severe  stenosis. There was trivial regurgitation. Peak velocity (S): 422  cm/s. Mean gradient (S): 39 mm Hg. Valve area (VTI): 0.67  cm^2.  Valve area (Vmax): 0.66 cm^2. Valve area (Vmean): 0.62 cm^2. - Mitral valve: Moderately calcified annulus. There was mild  regurgitation. - Left atrium: The atrium was mildly dilated. - Right ventricle: The cavity size was mildly dilated. Wall  thickness was normal. - Right atrium: The atrium was mildly dilated. Impressions: - Aortic stenosis has increased when compared to prior.  EKG:  EKG is ordered today. The ekg ordered today demonstrates Sinus, rate 50 bpm. Diffuse T wave abn.   Recent Labs: 11/02/2015: Hemoglobin 11.4; Platelets 218 11/08/2015: Magnesium 2.1 05/01/2016: ALT 22; BUN 23; Creat 1.50; Potassium 4.9; Sodium 138; TSH 5.68   Lipid Panel    Component Value Date/Time   CHOL 145 10/02/2015 1252   TRIG 139 10/02/2015 1252   HDL 31 (L) 10/02/2015 1252   CHOLHDL 4.7 10/02/2015 1252   VLDL 28 10/02/2015 1252   LDLCALC 86 10/02/2015 1252     Wt Readings from Last 3 Encounters:  09/12/16 254 lb 6.4 oz (115.4 kg)  09/02/16 257 lb (116.6 kg)  08/21/16 253 lb (114.8 kg)     Other studies Reviewed: Additional studies/ records that were reviewed today include: . Review of the above records demonstrates:    Assessment and Plan:   1. CAD: No recent chest pain suggestive of angina. Continue current meds.   2. Aortic stenosis: Severe by echo January 2017.  We have discussed AVR vs TAVR. He is asymptomatic. Will repeat echo January 2018. No indication right now for AVR.   3. ATRIAL FIBRILLATION, paroxysmal: Maintaining NSR. Continue beta blocker. Continue Pradaxa for anti-coagulation.   4. HTN: BP controlled at home. Continue current therapy.   5. HLD: He is on a statin. Lipids followed in primary care  6. PAD: followed by Dr. Fletcher Anon. Stable by dopplers November 2016.   Current medicines are reviewed at length with the patient today.  The patient does not have concerns regarding medicines.  The following changes have  been made:  no change  Labs/ tests  ordered today include:   Orders Placed This Encounter  Procedures  . ECHOCARDIOGRAM COMPLETE    Disposition:   FU with me in 6 months  Signed, [name], MD 09/12/2016 4:39 PM    [facility] [place], [place], [place]  [address] Phone: [phone]; Fax: [phone]

## 2016-10-14 ENCOUNTER — Other Ambulatory Visit: Payer: Self-pay

## 2016-10-14 MED ORDER — INSULIN LISPRO 100 UNIT/ML (KWIKPEN): 5.0000 [IU] | PEN_INJECTOR | Freq: Three times a day (TID) | SUBCUTANEOUS | 3 refills | Status: DC

## 2016-10-14 NOTE — Telephone Encounter (Signed)
Pharmacy stated pt req Humalog be filled by them not sure if pt has been having trouble with mail order

## 2016-10-21 ENCOUNTER — Other Ambulatory Visit: Payer: Self-pay | Admitting: Physician Assistant

## 2016-10-22 ENCOUNTER — Telehealth: Payer: Self-pay

## 2016-10-22 NOTE — Telephone Encounter (Signed)
Rx for humalog pen was faxed over on 12-4 and 12-12

## 2016-10-24 NOTE — Telephone Encounter (Signed)
Patient is calling again to say he needs his humalog, he is out please call and advise  [phone]

## 2016-10-24 NOTE — Telephone Encounter (Signed)
Pt came in the office today to inquire about his humalog pens and I explained to him they had been faxed over to his pharamacy on [address] on 12-4. I also called the pharmacy with the pt  standing beside me and informed him that the pharmacy states the Rx was ready for pick up and  they  had been avail for 2 days now.  Pt then stated he had not called the pharmacy in a couple of days to check  but apologized and stated he would go by there

## 2016-11-01 ENCOUNTER — Other Ambulatory Visit: Payer: Self-pay | Admitting: Physician Assistant

## 2016-11-12 DIAGNOSIS — D0339 Melanoma in situ of other parts of face: Secondary | ICD-10-CM | POA: Diagnosis not present

## 2016-11-12 DIAGNOSIS — L905 Scar conditions and fibrosis of skin: Secondary | ICD-10-CM | POA: Diagnosis not present

## 2016-11-12 DIAGNOSIS — D0439 Carcinoma in situ of skin of other parts of face: Secondary | ICD-10-CM | POA: Diagnosis not present

## 2016-11-19 ENCOUNTER — Other Ambulatory Visit: Payer: Self-pay

## 2016-11-19 ENCOUNTER — Ambulatory Visit (HOSPITAL_COMMUNITY): Payer: Medicare Other | Attending: Internal Medicine

## 2016-11-19 DIAGNOSIS — I2510 Atherosclerotic heart disease of native coronary artery without angina pectoris: Secondary | ICD-10-CM | POA: Insufficient documentation

## 2016-11-19 DIAGNOSIS — E785 Hyperlipidemia, unspecified: Secondary | ICD-10-CM | POA: Insufficient documentation

## 2016-11-19 DIAGNOSIS — I10 Essential (primary) hypertension: Secondary | ICD-10-CM | POA: Diagnosis not present

## 2016-11-19 DIAGNOSIS — I252 Old myocardial infarction: Secondary | ICD-10-CM | POA: Diagnosis not present

## 2016-11-19 DIAGNOSIS — Z87891 Personal history of nicotine dependence: Secondary | ICD-10-CM | POA: Diagnosis not present

## 2016-11-19 DIAGNOSIS — E119 Type 2 diabetes mellitus without complications: Secondary | ICD-10-CM | POA: Insufficient documentation

## 2016-11-19 DIAGNOSIS — I350 Nonrheumatic aortic (valve) stenosis: Secondary | ICD-10-CM | POA: Diagnosis not present

## 2016-11-21 ENCOUNTER — Ambulatory Visit: Payer: Medicare Other | Admitting: Physician Assistant

## 2016-11-21 ENCOUNTER — Other Ambulatory Visit: Payer: Self-pay | Admitting: Physician Assistant

## 2016-11-22 ENCOUNTER — Other Ambulatory Visit: Payer: Self-pay | Admitting: Physician Assistant

## 2017-02-03 ENCOUNTER — Encounter: Payer: Self-pay | Admitting: Adult Health

## 2017-02-03 ENCOUNTER — Ambulatory Visit (INDEPENDENT_AMBULATORY_CARE_PROVIDER_SITE_OTHER): Payer: Medicare Other | Admitting: Adult Health

## 2017-02-03 VITALS — BP 122/60 | HR 84 | Ht 71.0 in | Wt 249.4 lb

## 2017-02-03 DIAGNOSIS — Z9989 Dependence on other enabling machines and devices: Secondary | ICD-10-CM

## 2017-02-03 DIAGNOSIS — G4733 Obstructive sleep apnea (adult) (pediatric): Secondary | ICD-10-CM | POA: Diagnosis not present

## 2017-02-03 NOTE — Patient Instructions (Addendum)
Continue using CPAP nightly Sleep lab today for mask refitting If your symptoms worsen or you develop new symptoms please let us know.

## 2017-02-03 NOTE — Progress Notes (Signed)
I agree with the assessment and plan as directed by NP .The patient is known to me .   Ninnie Fein, MD  

## 2017-02-03 NOTE — Progress Notes (Signed)
PATIENT: Steven Maldonado DOB: [birth year]  REASON FOR VISIT: follow up- OSA on CPAP HISTORY FROM: patient  HISTORY OF PRESENT ILLNESS: Steven Maldonado is a 76 year old male with a history of obstructive sleep apnea on CPAP. He returns today for follow-up. He is currently using the CPAP 25 out of 30 days for compliance of 83%. He uses his machine greater than 4 hours 23 out of 30 days for compliance of 77%. On average he uses his machine 7 hours and 17 minutes. He is on a pressure of 14 cm water with EPR 3. His residual AHI is 1.8. He does have a significant leak in the 95th percentile at 49.3 L/m. He states that he is only able to change his straps every 6 months and they do get loose quickly. He also states that he does know that he pushes his mask off his face at night as his wife has witnessed him do this. He states that he is unaware that he does this. He does note that he does feel air leaking around the mask when he puts it on. He returns today for an evaluation.  HISTORY Steven Maldonado is a 76 year old male with a history of obstructive sleep apnea on CPAP. He returns today for a compliance download. His download indicates that he uses his machine 27 out of 30 days for compliance of 90%. He uses his machine greater than 4 hours 23 out of 30 days for compliance of 77%. On average he uses his machine 7 hours and 36 minutes. His residual AHI is 2.2 on 14 cm of water with EPR of 3. The patient does have a significant leak. His leak in the 95th percentile is 110.0 L/m. The patient does confirm that his mask leaks during the night. He reports that his wife also complains of hearing the mask leak. The patient's Epworth sleepiness score is 3 was previously 2. Overall he does feel that the CPAP is beneficial. He tries to use the CPAP nightly however if he is  out of town for one night he typically does not take his machine with him. He typically goes to bed around midnight and will get up around 8 AM. He returns  today for an evaluation.   REVIEW OF SYSTEMS: Out of a complete 14 system review of symptoms, the patient complains only of the following symptoms, and all other reviewed systems are negative.  See history of present illness, Epworth sleepiness score 5 fatigue severity score 10  ALLERGIES: No Known Allergies  HOME MEDICATIONS: Outpatient Medications Prior to Visit  Medication Sig Dispense Refill  . aspirin EC 81 MG tablet Take 81 mg by mouth daily.    [name] Kitchen atorvastatin (LIPITOR) 80 MG tablet TAKE 1 TABLET AT BEDTIME 90 tablet 3  . BD PEN NEEDLE NANO U/F 32G X 4 MM MISC USE 4 (FOUR) TIMES DAILY. 400 each 1  . Blood Glucose Monitoring Suppl (FREESTYLE LITE) DEVI 1 each by Does not apply route 2 (two) times daily. 1 each 0  . Choline Fenofibrate (TRILIPIX) 135 MG capsule Take 1 capsule (135 mg total) by mouth daily. 90 capsule 3  . docusate sodium (COLACE) 100 MG capsule Take 100 mg by mouth daily as needed for mild constipation.    . fesoterodine (TOVIAZ) 4 MG TB24 tablet Take 4 mg by mouth daily.    . furosemide (LASIX) 20 MG tablet Take 1 tablet (20 mg total) by mouth daily. 90 tablet 3  . glipiZIDE (GLUCOTROL XL)  10 MG 24 hr tablet TAKE 1 TABLET (10 MG TOTAL) BY MOUTH DAILY WITH BREAKFAST. 30 tablet 5  . glucose blood (FREESTYLE LITE) test strip 1 each by Other route 4 (four) times daily -  before meals and at bedtime. 450 each 3  . Insulin Glargine (BASAGLAR KWIKPEN) 100 UNIT/ML SOPN Inject 60 Units into the skin at bedtime.    . insulin lispro (HUMALOG) 100 UNIT/ML KiwkPen Inject 0.05 mLs (5 Units total) into the skin 3 (three) times daily. 15 mL 3  . magnesium oxide (MAG-OX) 400 MG tablet Take 1 tablet (400 mg total) by mouth daily. 30 tablet 11  . metoprolol (LOPRESSOR) 50 MG tablet TAKE 1 TABLET (50 MG TOTAL) BY MOUTH 2 (TWO) TIMES DAILY. 180 tablet 1  . mirabegron ER (MYRBETRIQ) 25 MG TB24 tablet TAKE 1 TABLET (25 MG TOTAL) BY MOUTH DAILY. 90 tablet 3  . PRADAXA 150 MG CAPS capsule  TAKE 1 CAPSULE EVERY 12    HOURS 180 capsule 3  . magnesium oxide (MAG-OX) 400 (241.3 Mg) MG tablet TAKE 1 TABLET (400 MG TOTAL) BY MOUTH DAILY. (Patient not taking: Reported on 02/03/2017) 30 tablet 11   No facility-administered medications prior to visit.     PAST MEDICAL HISTORY: Past Medical History:  Diagnosis Date  . Aortic stenosis    a. Mod by echo 12/2014.  . Cataract associated with type 2 diabetes mellitus ([place]) 10/06/2013  . CKD (chronic kidney disease), stage III   . Coronary artery disease    a. Cath February 2012 in [facility], occluded RCA with collaterals  . Diabetic ulcer of heel ([place]) 07/14/2014  . DM type 2 (diabetes mellitus, type 2) ([place])   . Essential hypertension   . Hyperkalemia   . Hyperlipidemia   . NSTEMI (non-ST elevated myocardial infarction) ([place]) 12/2010  . Obstructive sleep apnea    2012  . PAF (paroxysmal atrial fibrillation) ([place])    a. dx 2012  s/p DCCV 07/2011  . PVD (peripheral vascular disease) ([place])    a. s/p R popliteal artery stenosis tx with drug-coated balloon 05/2014, followed by Dr. Fletcher Anon.  . Skin cancer    "cut off back of neck X 2; off left upper arm; right wrist, near right shoulder blade; several burned off other areas of my body" (06/08/2014)  . Sleep apnea with use of continuous positive airway pressure (CPAP)    04-11-11 AHI was 32.9 and titrated to 15 cm H20, DME is [facility]  . Subclinical hypothyroidism     PAST SURGICAL HISTORY: Past Surgical History:  Procedure Laterality Date  . ABDOMINAL ANGIOGRAM N/A 06/08/2014   Procedure: ABDOMINAL ANGIOGRAM;  Surgeon: Wellington Hampshire, MD;  Location: [facility] CATH LAB;  Service: Cardiovascular;  Laterality: N/A;  . APPENDECTOMY  1965  . CARDIAC CATHETERIZATION  12/2010  . CARDIOVERSION  07/2011  . CARDIOVERSION N/A 04/18/2014   Procedure: CARDIOVERSION;  Surgeon: Dorothy Spark, MD;  Location: [facility] ENDOSCOPY;  Service: Cardiovascular;  Laterality: N/A;  . CARDIOVERSION N/A 11/03/2015   Procedure:  CARDIOVERSION;  Surgeon: Lelon Perla, MD;  Location: [facility];  Service: Cardiovascular;  Laterality: N/A;  . LOWER EXTREMITY ANGIOGRAM N/A 06/08/2014   Procedure: LOWER EXTREMITY ANGIOGRAM;  Surgeon: Wellington Hampshire, MD;  Location: [facility] CATH LAB;  Service: Cardiovascular;  Laterality: N/A;  . POPLITEAL ARTERY ANGIOPLASTY Right 06/08/2014   Archie Endo 06/08/2014  . SKIN CANCER EXCISION Bilateral    "have had them cut off back of neck X 2; off left  upper arm; right wrist, near right shoulder blade" (06/08/2014)    FAMILY HISTORY: Family History  Problem Relation Age of Onset  . Diabetes Mother   . Heart attack Mother   . Heart attack Father   . Heart failure Father   . Diabetes Other   . Diabetes Sister   . Diabetes Brother   . Diabetes Daughter     TYPE ll  . Heart Problems Daughter   . Hypertension Mother   . Hypertension Father   . Hypertension Sister   . Hypertension Brother   . Diabetes Father   . Stroke Brother     SOCIAL HISTORY: Social History   Social History  . Marital status: Married    Spouse name: [name]  . Number of children: 1  . Years of education: 9   Occupational History  . retired Sales executive   Social History Main Topics  . Smoking status: Former Smoker    Packs/day: 2.00    Years: 14.00    Types: Cigarettes    Quit date: 11/11/1972  . Smokeless tobacco: Never Used  . Alcohol use No     Comment: quit in 1984  . Drug use: No  . Sexual activity: Not Currently   Other Topics Concern  . 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   Patient has one child and his wife has one child.   Patient is retired.   Patient has a high school education.   Patient is right-handed.   Patient drinks very little caffeine.      PHYSICAL EXAM  Vitals:   02/03/17 1434  BP: 122/60  Pulse: 84  Weight: 249 lb 6.4 oz (113.1 kg)  Height: 5\' 11"  (1.803 m)   Body mass index is 34.78  kg/m.  Generalized: Well developed, in no acute distress   Neurological examination  Mentation: Alert oriented to time, place, history taking. Follows all commands speech and language fluent Cranial nerve II-XII: Pupils were equal round reactive to light. Extraocular movements were full, visual field were full on confrontational test.  Uvula tongue midline. Head turning and shoulder shrug  were normal and symmetric.Neck circumference 21 inches, Mallampati 3+ Motor: The motor testing reveals 5 over 5 strength of all 4 extremities. Good symmetric motor tone is noted throughout.  Sensory: Sensory testing is intact to soft touch on all 4 extremities. No evidence of extinction is noted.   Gait and station: Gait is normal.   .   DIAGNOSTIC DATA (LABS, IMAGING, TESTING) - I reviewed patient records, labs, notes, testing and imaging myself where available.  Lab Results  Component Value Date   WBC 6.1 11/02/2015   HGB 11.4 (L) 11/02/2015   HCT 34.6 (L) 11/02/2015   MCV 93.8 11/02/2015   PLT 218 11/02/2015      Component Value Date/Time   NA 138 05/01/2016 1509   K 4.9 05/01/2016 1509   CL 102 05/01/2016 1509   CO2 21 05/01/2016 1509   GLUCOSE 182 (H) 05/01/2016 1509   BUN 23 05/01/2016 1509   CREATININE 1.50 (H) 05/01/2016 1509   CALCIUM 9.4 05/01/2016 1509   PROT 6.8 05/01/2016 1509   ALBUMIN 4.2 05/01/2016 1509   AST 21 05/01/2016 1509   ALT 22 05/01/2016 1509   ALKPHOS 31 (L) 05/01/2016 1509   BILITOT 0.4 05/01/2016 1509   GFRNONAA 46 (L) 05/01/2016 1509   GFRAA 53 (L) 05/01/2016 1509  Lab Results  Component Value Date   CHOL 145 10/02/2015   HDL 31 (L) 10/02/2015   LDLCALC 86 10/02/2015   TRIG 139 10/02/2015   CHOLHDL 4.7 10/02/2015   Lab Results  Component Value Date   HGBA1C 11.4 (H) 08/21/2016   No results found for: OITGPQDI26 Lab Results  Component Value Date   TSH 5.68 (H) 05/01/2016      ASSESSMENT AND PLAN 76 y.o. year old male  has a past medical  history of Aortic stenosis; Cataract associated with type 2 diabetes mellitus ([place]) (10/06/2013); CKD (chronic kidney disease), stage III; Coronary artery disease; Diabetic ulcer of heel ([place]) (07/14/2014); DM type 2 (diabetes mellitus, type 2) ([place]); Essential hypertension; Hyperkalemia; Hyperlipidemia; NSTEMI (non-ST elevated myocardial infarction) ([place]) (12/2010); Obstructive sleep apnea; PAF (paroxysmal atrial fibrillation) ([place]); PVD (peripheral vascular disease) ([place]); Skin cancer; Sleep apnea with use of continuous positive airway pressure (CPAP); and Subclinical hypothyroidism. here with:  1. Obstructive sleep apnea on CPAP  The patient's download shows excellent compliance and good treatment of his apnea however he does have a high leak. He will stop our sleep lab today to have his mask refitted. He will follow-up in one year with Dr. Brett Fairy.  I spent 15 minutes with the patient 50% of this time was spent reviewing the patient's CPAP download.   Ward Givens, MSN, NP-C 02/03/2017, 2:55 PM [facility] [address], [place], [place] [address] [phone]

## 2017-02-10 DIAGNOSIS — L821 Other seborrheic keratosis: Secondary | ICD-10-CM | POA: Diagnosis not present

## 2017-02-10 DIAGNOSIS — L570 Actinic keratosis: Secondary | ICD-10-CM | POA: Diagnosis not present

## 2017-02-10 DIAGNOSIS — X32XXXA Exposure to sunlight, initial encounter: Secondary | ICD-10-CM | POA: Diagnosis not present

## 2017-02-10 DIAGNOSIS — Z08 Encounter for follow-up examination after completed treatment for malignant neoplasm: Secondary | ICD-10-CM | POA: Diagnosis not present

## 2017-02-10 DIAGNOSIS — Z85828 Personal history of other malignant neoplasm of skin: Secondary | ICD-10-CM | POA: Diagnosis not present

## 2017-02-23 ENCOUNTER — Other Ambulatory Visit: Payer: Self-pay | Admitting: Physician Assistant

## 2017-02-23 DIAGNOSIS — I10 Essential (primary) hypertension: Secondary | ICD-10-CM

## 2017-02-24 NOTE — Telephone Encounter (Signed)
Refill per protocol patient is due for an office visit. Letter has been mailed

## 2017-02-27 DIAGNOSIS — H6123 Impacted cerumen, bilateral: Secondary | ICD-10-CM | POA: Diagnosis not present

## 2017-03-18 ENCOUNTER — Encounter: Payer: Self-pay | Admitting: Cardiovascular Disease

## 2017-03-18 ENCOUNTER — Other Ambulatory Visit: Payer: Self-pay

## 2017-03-18 ENCOUNTER — Ambulatory Visit (INDEPENDENT_AMBULATORY_CARE_PROVIDER_SITE_OTHER): Payer: Medicare Other | Admitting: Cardiovascular Disease

## 2017-03-18 VITALS — BP 130/68 | HR 107 | Ht 71.0 in | Wt 252.0 lb

## 2017-03-18 DIAGNOSIS — E785 Hyperlipidemia, unspecified: Secondary | ICD-10-CM | POA: Diagnosis not present

## 2017-03-18 DIAGNOSIS — I480 Paroxysmal atrial fibrillation: Secondary | ICD-10-CM

## 2017-03-18 DIAGNOSIS — I739 Peripheral vascular disease, unspecified: Secondary | ICD-10-CM | POA: Diagnosis not present

## 2017-03-18 DIAGNOSIS — I10 Essential (primary) hypertension: Secondary | ICD-10-CM

## 2017-03-18 DIAGNOSIS — I2510 Atherosclerotic heart disease of native coronary artery without angina pectoris: Secondary | ICD-10-CM | POA: Diagnosis not present

## 2017-03-18 MED ORDER — METOPROLOL TARTRATE 50 MG PO TABS: ORAL_TABLET | ORAL | 6 refills | Status: DC

## 2017-03-18 MED ORDER — INSULIN LISPRO 100 UNIT/ML (KWIKPEN): 5.0000 [IU] | PEN_INJECTOR | Freq: Three times a day (TID) | SUBCUTANEOUS | 3 refills | Status: DC

## 2017-03-18 NOTE — Progress Notes (Signed)
Cardiology Office Note   Date:  03/18/2017   ID:  Steven Maldonado 12-28-1940, MRN [id number]  PCP:  [name], [name]  Cardiologist:  Dr. Angelena Form  Chief Complaint  Patient presents with  . Follow-up      History of Present Illness: Steven Maldonado is a 76 y.o. male who presents for  a followup visit regarding  PAD . He has known hx of DM, HTN, hyperlipidemia, A-fib, moderate-severe aortic stenosis and CAD.  He is s/p NSTEMI in the setting of atrial fibrillation with RVR in 12/2010.  Cardiac cath with occluded RCA with collaterals. He was seen in 2015 for worsening severe  right calf claudication with skin breakdown on the right heal that was slow to heal. Angiography in 2015 showed: 1. No significant aortoiliac disease.  2. Chronically occluded left popliteal artery with 2 vessel runoff below the knee with reasonable collaterals.  3. Occluded heavily calcified right popliteal artery with poor collaterals. 2 vessel runoff below the knee with an occluded posterior tibial.  I performed successful angioplasty and drug-coated balloon angioplasty of the right popliteal artery.  He has been doing reasonably well and denies any chest pain or worsening dyspnea. He is noted to be in atrial fibrillation today. He reports that he noted palpitations about 4 days ago but this time he doesn't feel as bad as when he usually goes into A. fib. He reports mild bilateral calf claudication that doesn't limit his activities. He has been taking his medications regularly. He had an echocardiogram done in January which showed normal LV systolic function, moderate to severe aortic stenosis with mean gradient of 32 mmHg and moderately dilated left atrium.  Past Medical History:  Diagnosis Date  . Aortic stenosis    a. Mod by echo 12/2014.  . Cataract associated with type 2 diabetes mellitus ([place]) 10/06/2013  . CKD (chronic kidney disease), stage III   . Coronary artery disease    a. Cath February 2012  in [facility], occluded RCA with collaterals  . Diabetic ulcer of heel ([place]) 07/14/2014  . DM type 2 (diabetes mellitus, type 2) ([place])   . Essential hypertension   . Hyperkalemia   . Hyperlipidemia   . NSTEMI (non-ST elevated myocardial infarction) ([place]) 12/2010  . Obstructive sleep apnea    2012  . PAF (paroxysmal atrial fibrillation) ([place])    a. dx 2012  s/p DCCV 07/2011  . PVD (peripheral vascular disease) ([place])    a. s/p R popliteal artery stenosis tx with drug-coated balloon 05/2014, followed by Dr. Fletcher Anon.  . Skin cancer    "cut off back of neck X 2; off left upper arm; right wrist, near right shoulder blade; several burned off other areas of my body" (06/08/2014)  . Sleep apnea with use of continuous positive airway pressure (CPAP)    04-11-11 AHI was 32.9 and titrated to 15 cm H20, DME is [facility]  . Subclinical hypothyroidism     Past Surgical History:  Procedure Laterality Date  . ABDOMINAL ANGIOGRAM N/A 06/08/2014   Procedure: ABDOMINAL ANGIOGRAM;  Surgeon: Wellington Hampshire, MD;  Location: [facility] CATH LAB;  Service: Cardiovascular;  Laterality: N/A;  . APPENDECTOMY  1965  . CARDIAC CATHETERIZATION  12/2010  . CARDIOVERSION  07/2011  . CARDIOVERSION N/A 04/18/2014   Procedure: CARDIOVERSION;  Surgeon: Dorothy Spark, MD;  Location: [facility] ENDOSCOPY;  Service: Cardiovascular;  Laterality: N/A;  . CARDIOVERSION N/A 11/03/2015   Procedure: CARDIOVERSION;  Surgeon: Denice Bors  Stanford Breed, MD;  Location: [facility];  Service: Cardiovascular;  Laterality: N/A;  . LOWER EXTREMITY ANGIOGRAM N/A 06/08/2014   Procedure: LOWER EXTREMITY ANGIOGRAM;  Surgeon: Wellington Hampshire, MD;  Location: [facility] CATH LAB;  Service: Cardiovascular;  Laterality: N/A;  . POPLITEAL ARTERY ANGIOPLASTY Right 06/08/2014   Archie Endo 06/08/2014  . SKIN CANCER EXCISION Bilateral    "have had them cut off back of neck X 2; off left upper arm; right wrist, near right shoulder blade" (06/08/2014)     Current Outpatient Prescriptions    Medication Sig Dispense Refill  . aspirin EC 81 MG tablet Take 81 mg by mouth daily.    [name] Kitchen atorvastatin (LIPITOR) 80 MG tablet TAKE 1 TABLET AT BEDTIME 90 tablet 3  . BD PEN NEEDLE NANO U/F 32G X 4 MM MISC USE 4 (FOUR) TIMES DAILY. 400 each 1  . Blood Glucose Monitoring Suppl (FREESTYLE LITE) DEVI 1 each by Does not apply route 2 (two) times daily. 1 each 0  . Choline Fenofibrate (TRILIPIX) 135 MG capsule Take 1 capsule (135 mg total) by mouth daily. 90 capsule 3  . docusate sodium (COLACE) 100 MG capsule Take 100 mg by mouth daily as needed for mild constipation.    . fesoterodine (TOVIAZ) 4 MG TB24 tablet Take 4 mg by mouth daily.    . furosemide (LASIX) 20 MG tablet Take 1 tablet (20 mg total) by mouth daily. 90 tablet 3  . glipiZIDE (GLUCOTROL XL) 10 MG 24 hr tablet TAKE 1 TABLET (10 MG TOTAL) BY MOUTH DAILY WITH BREAKFAST. 30 tablet 5  . glucose blood (FREESTYLE LITE) test strip 1 each by Other route 4 (four) times daily -  before meals and at bedtime. 450 each 3  . Insulin Glargine (BASAGLAR KWIKPEN) 100 UNIT/ML SOPN Inject 60 Units into the skin at bedtime.    . insulin lispro (HUMALOG) 100 UNIT/ML KiwkPen Inject 0.05 mLs (5 Units total) into the skin 3 (three) times daily. 15 mL 3  . magnesium oxide (MAG-OX) 400 MG tablet Take 1 tablet (400 mg total) by mouth daily. 30 tablet 11  . metoprolol (LOPRESSOR) 50 MG tablet TAKE 1 TABLET (50 MG TOTAL) BY MOUTH 2 (TWO) TIMES DAILY. 60 tablet 0  . mirabegron ER (MYRBETRIQ) 25 MG TB24 tablet TAKE 1 TABLET (25 MG TOTAL) BY MOUTH DAILY. 90 tablet 3  . PRADAXA 150 MG CAPS capsule TAKE 1 CAPSULE EVERY 12    HOURS 180 capsule 3   No current facility-administered medications for this visit.     Allergies:   Patient has no known allergies.    Social History:  The patient  reports that he quit smoking about 44 years ago. His smoking use included Cigarettes. He has a 28.00 pack-year smoking history. He has never used smokeless tobacco. He reports that  he does not drink alcohol or use drugs.   Family History:  The patient's family history includes Diabetes in his brother, daughter, father, mother, other, and sister; Heart Problems in his daughter; Heart attack in his father and mother; Heart failure in his father; Hypertension in his brother, father, mother, and sister; Stroke in his brother.    ROS:  Please see the history of present illness.   Otherwise, review of systems are positive for none.   All other systems are reviewed and negative.    PHYSICAL EXAM: VS:  BP 130/68 (BP Location: Right Arm, Patient Position: Sitting)   Pulse (!) 107   Ht 5\' 11"  (1.803 m)  Wt 252 lb (114.3 kg)   BMI 35.15 kg/m  , BMI Body mass index is 35.15 kg/m. GEN: Well nourished, well developed, in no acute distress  HEENT: normal  Neck: no JVD, carotid bruits, or masses Cardiac: Irregularly irregular; no  rubs, or gallops,no edema . There is a 3/6 mid-late peaking aortic stenosis murmur Respiratory:  clear to auscultation bilaterally, normal work of breathing GI: soft, nontender, nondistended, + BS MS: no deformity or atrophy  Skin: warm and dry, no rash Neuro:  Strength and sensation are intact Psych: euthymic mood, full affect Vascular: Femoral pulses are normal. Distal pulses are not palpable.  EKG:  EKG is ordered today. EKG showed atrial fibrillation with ventricular rate of 107 bpm. Nonspecific lateral T wave changes.   Recent Labs: 05/01/2016: ALT 22; BUN 23; Creat 1.50; Potassium 4.9; Sodium 138; TSH 5.68    Lipid Panel    Component Value Date/Time   CHOL 145 10/02/2015 1252   TRIG 139 10/02/2015 1252   HDL 31 (L) 10/02/2015 1252   CHOLHDL 4.7 10/02/2015 1252   VLDL 28 10/02/2015 1252   LDLCALC 86 10/02/2015 1252      Wt Readings from Last 3 Encounters:  03/18/17 252 lb (114.3 kg)  02/03/17 249 lb 6.4 oz (113.1 kg)  09/12/16 254 lb 6.4 oz (115.4 kg)        ASSESSMENT AND PLAN:  1.  Peripheral arterial disease:  Previous right popliteal artery drug-coated balloon angioplasty with continued patency.  He has minimal claudication at this time. Continue medical therapy.  2. Atrial fibrillation: He is noted to be in atrial fibrillation today but he seems to have only mild symptoms related to this including palpitations. He is on long-term anticoagulation with Pradaxa. I elected to increase metoprolol to 75 mg twice daily. Given underlying aortic stenosis and moderately dilated left atrium, it might be difficult to keep him in sinus rhythm without an antiarrhythmic medication. We might elect for rate control if he is not significantly symptomatic. I am going to discuss with Dr. Jearld Lesch his primary cardiologist. He has an appointment with him next month.  3. Hyperlipidemia: Continue treatment with high dose atorvastatin with a target LDL of less than 70.  4. Coronary artery disease involving native coronary arteries without angina or medical therapy.  5. Moderate to severe aortic stenosis: Appears to be stable.   Disposition:   FU with me in 1 year  Signed,  [name], MD  03/18/2017 10:04 AM    [place]

## 2017-03-18 NOTE — Patient Instructions (Signed)
Medication Instructions:  Your physician has recommended you make the following change in your medication:  1. INCREASE Metoprolol Tartrate to 50mg  take one and one-half tablet by mouth twice a day  Labwork: No new orders.   Testing/Procedures: No new orders.   Follow-Up: Please keep your scheduled follow-up appointment with Dr Angelena Form.  Your physician wants you to follow-up in: 1 YEAR with Dr Fletcher Anon. You will receive a reminder letter in the mail two months in advance. If you don't receive a letter, please call our office to schedule the follow-up appointment.'  Any Other Special Instructions Will Be Listed Below (If Applicable).     If you need a refill on your cardiac medications before your next appointment, please call your pharmacy.

## 2017-03-28 ENCOUNTER — Telehealth: Payer: Self-pay | Admitting: Cardiovascular Disease

## 2017-03-28 NOTE — Telephone Encounter (Signed)
Informed that I would send to Dr. Angelena Form and his nurse to address when they are back in the office next week. Pt thanks me for letting him know.

## 2017-03-28 NOTE — Telephone Encounter (Signed)
New message   Pt received results that hes gone back in afib, does he need to make an appt? Requests call back

## 2017-03-30 NOTE — Telephone Encounter (Signed)
It looks like DR. Arida saw him 03/16/17 and noted atrial fib, increased his Lopressor. The patient was asymptomatic at that time. He is on Pradaxa. If he is feeling ok, no need to move his appt up from 05/05/17 with me but if he begins to feel bad, we can move his appt up. Thanks, chris

## 2017-03-31 NOTE — Telephone Encounter (Signed)
I spoke with pt and gave him information from Dr. Angelena Form.  He reports chronic shortness of breath but no recent changes.  He will plan on keeping 05/05/17 appt but will let us know if any changes prior to this appt.

## 2017-05-01 ENCOUNTER — Encounter: Payer: Self-pay | Admitting: Physician Assistant

## 2017-05-01 ENCOUNTER — Ambulatory Visit (INDEPENDENT_AMBULATORY_CARE_PROVIDER_SITE_OTHER): Payer: Medicare Other | Admitting: Physician Assistant

## 2017-05-01 VITALS — BP 130/60 | HR 87 | Temp 98.0°F | Resp 16 | Ht 71.0 in | Wt 255.2 lb

## 2017-05-01 DIAGNOSIS — I10 Essential (primary) hypertension: Secondary | ICD-10-CM | POA: Diagnosis not present

## 2017-05-01 DIAGNOSIS — E039 Hypothyroidism, unspecified: Secondary | ICD-10-CM

## 2017-05-01 DIAGNOSIS — I2510 Atherosclerotic heart disease of native coronary artery without angina pectoris: Secondary | ICD-10-CM

## 2017-05-01 DIAGNOSIS — Z9989 Dependence on other enabling machines and devices: Secondary | ICD-10-CM | POA: Diagnosis not present

## 2017-05-01 DIAGNOSIS — E119 Type 2 diabetes mellitus without complications: Secondary | ICD-10-CM | POA: Diagnosis not present

## 2017-05-01 DIAGNOSIS — L03031 Cellulitis of right toe: Secondary | ICD-10-CM | POA: Diagnosis not present

## 2017-05-01 DIAGNOSIS — I739 Peripheral vascular disease, unspecified: Secondary | ICD-10-CM | POA: Diagnosis not present

## 2017-05-01 DIAGNOSIS — I4891 Unspecified atrial fibrillation: Secondary | ICD-10-CM | POA: Diagnosis not present

## 2017-05-01 DIAGNOSIS — E038 Other specified hypothyroidism: Secondary | ICD-10-CM

## 2017-05-01 DIAGNOSIS — I350 Nonrheumatic aortic (valve) stenosis: Secondary | ICD-10-CM

## 2017-05-01 DIAGNOSIS — G4733 Obstructive sleep apnea (adult) (pediatric): Secondary | ICD-10-CM

## 2017-05-01 MED ORDER — CEPHALEXIN 500 MG PO CAPS: 500.0000 mg | ORAL_CAPSULE | Freq: Four times a day (QID) | ORAL | 0 refills | Status: DC

## 2017-05-01 NOTE — Progress Notes (Signed)
Patient ID: Steven Maldonado MRN: [id number], DOB: [birth year], 76 y.o. Date of Encounter: 05/01/2017, 11:59 AM    Chief Complaint:  Chief Complaint  Patient presents with  . ace/arb    f/u  . diabetes f/u     HPI: 76 y.o. year old male here to f/u high Blood Sugar, Uncontrolled Diabetes.   08/21/2016: At his last routine office visit his A1c result came back 11.4.  This was on 08/21/2016.  At that time I told him to increase his Basaglar to 60 units. Also told him to document fasting morning blood sugar readings on a log form and bring this with him to office visit with me in one week.  Today he reports that he did increase the Basaglar to the 60 units.  Today he does bring in blood sugar log sheet for me to review. Fasting morning blood sugar readings have been: 172 179  160  167  140  150 135 129  States that his wife is doing weight watchers and has lost 10 pounds. He asked me if it would be okay for him to eat the same thing she is eating and follow that. Says that would be easier than eating 2 separate meals. However I remember him discussing this with me at the last office visit and I had already encouraged him at that time to eat what she was eating and follow the Weight Watchers diet. Today he also makes mention that he has seen 4 different dietitians in the past and "knows what he is supposed to eat-- it's just a matter of doing it."   today I also discussed with him.snacking and whether he even if he eats meals with her whether he is going to be sneaking other foods in addition.   he states that they have gotten rid of all snack foods and that there are no snack foods in the house. Asked him if he will drive to the store and get some for himself but he says he will not.   asked him if he thinks he needs to have a follow-up visit with me just in a couple weeks to make sure that he is being compliant. He says that he will be compliant and that he needs to do this for  him, not for me. I agreed but just wanted to make sure that he will actually stick with this.  He is going to continue the Basaglar at the current dose of 60 units. He is going to continue to document blood sugars and will call me if he is getting higher readings than where they are.   05/01/2017: Today he reports that he is sorry that he has not been here for his routine visit and knows that he is overdue for visit with me.  Says that he ended up staying in [place] for 4 months house sitting for his nephew. Says that he has one other thing he wanted to make sure to let me now--says that he is in A. fib and HEENT Arty knows that--- says that he recently saw cardiology and their EKG showed that he was in A. Fib. Again today he tells me that he plans to make diet changes. He acts as if he does not even remember the conversation that we had at his last office visit and all that is documented in the note above--today states that he wants to live to be a grandfather that he wants to take better care of himself  and that he has gone to lots of nutritionist and his wife has been doing weight watchers and he knows what to do and says that he is going to start doing it---!!!!!! States that he is taking the insulin as directed. He is also taking metoprolol and Lasix as directed. Having no lightheadedness. Taking Lipitor as directed. No myalgias or right upper quadrant pain.      Home Meds:   Outpatient Medications Prior to Visit  Medication Sig Dispense Refill  . aspirin EC 81 MG tablet Take 81 mg by mouth daily.    [name] Kitchen atorvastatin (LIPITOR) 80 MG tablet TAKE 1 TABLET AT BEDTIME 90 tablet 3  . BD PEN NEEDLE NANO U/F 32G X 4 MM MISC USE 4 (FOUR) TIMES DAILY. 400 each 1  . Blood Glucose Monitoring Suppl (FREESTYLE LITE) DEVI 1 each by Does not apply route 2 (two) times daily. 1 each 0  . Choline Fenofibrate (TRILIPIX) 135 MG capsule Take 1 capsule (135 mg total) by mouth daily. 90 capsule 3  . docusate  sodium (COLACE) 100 MG capsule Take 100 mg by mouth daily as needed for mild constipation.    . fesoterodine (TOVIAZ) 4 MG TB24 tablet Take 4 mg by mouth daily.    . furosemide (LASIX) 20 MG tablet Take 1 tablet (20 mg total) by mouth daily. 90 tablet 3  . glipiZIDE (GLUCOTROL XL) 10 MG 24 hr tablet TAKE 1 TABLET (10 MG TOTAL) BY MOUTH DAILY WITH BREAKFAST. 30 tablet 5  . glucose blood (FREESTYLE LITE) test strip 1 each by Other route 4 (four) times daily -  before meals and at bedtime. 450 each 3  . Insulin Glargine (BASAGLAR KWIKPEN) 100 UNIT/ML SOPN Inject 60 Units into the skin at bedtime.    . insulin lispro (HUMALOG) 100 UNIT/ML KiwkPen Inject 0.05 mLs (5 Units total) into the skin 3 (three) times daily. 15 mL 3  . magnesium oxide (MAG-OX) 400 MG tablet Take 1 tablet (400 mg total) by mouth daily. 30 tablet 11  . metoprolol (LOPRESSOR) 50 MG tablet Take one and one-half tablet by mouth twice a day 90 tablet 6  . mirabegron ER (MYRBETRIQ) 25 MG TB24 tablet TAKE 1 TABLET (25 MG TOTAL) BY MOUTH DAILY. 90 tablet 3  . PRADAXA 150 MG CAPS capsule TAKE 1 CAPSULE EVERY 12    HOURS 180 capsule 3   No facility-administered medications prior to visit.     Allergies: No Known Allergies    Review of Systems: See HPI for pertinent ROS. All other ROS negative.    Physical Exam: Blood pressure 130/60, pulse 87, temperature 98 F (36.7 C), temperature source Oral, resp. rate 16, height 5\' 11"  (1.803 m), weight 255 lb 3.2 oz (115.8 kg), SpO2 98 %., Body mass index is 35.59 kg/m. General:  Obese WM. Appears in no acute distress. Neck: Supple. No thyromegaly. No lymphadenopathy. Lungs: Clear bilaterally to auscultation without wheezes, rales, or rhonchi. Breathing is unlabored. Heart: Irregular rhythm. Abdomen: Soft, non-tender, non-distended with normoactive bowel sounds. No hepatomegaly. No rebound/guarding. No obvious abdominal masses. Msk:  Strength and tone normal for age. Extremities/Skin:  Warm and dry. Diabetic foot exam: Right second toe--along proximal edge of nail--- there is area of erythema--- midline portion of this-- there is a tiny area (61mm) of white coloration. Neuro: Alert and oriented X 3. Moves all extremities spontaneously. Gait is normal. CNII-XII grossly in tact. Psych:  Responds to questions appropriately with a normal affect.  ASSESSMENT AND PLAN:  76 y.o. year old male with   1. Coronary artery disease involving native coronary artery of native heart without angina pectoris 05/01/2017---Managed by Cardiology  2. Aortic valve stenosis, etiology of cardiac valve disease unspecified 05/01/2017---Managed by Cardiology   3. Atrial fibrillation, unspecified type ([place]) 05/01/2017---Managed by Cardiology   4. PAD (peripheral artery disease) ([place]) 05/01/2017---Managed by Cardiology   5. Essential hypertension 05/01/2017--Blood pressure is at goal. Continue current medications. Check lab to monitor. - COMPLETE METABOLIC PANEL WITH GFR  6. OSA on CPAP 05/01/2017--stable, on CPAP  7. Subclinical hypothyroidism 05/01/2017--recheck lab to monitor - TSH - T4, free  8. Type 2 diabetes mellitus without complication, without long-term current use of insulin ([place]) 05/01/2017-- Will check the following labs to monitor. Plan for routine visit in 3 months but will have him follow-up sooner if labs are abnormal/A1c is uncontrolled. - COMPLETE METABOLIC PANEL WITH GFR - Lipid panel - Hemoglobin A1c - Microalbumin, urine  9. Cellulitis of toe of right foot 05/01/2017-- Reminded him of the importance of proper foot care given his diabetes. Told him to start the antibiotic immediately and take as directed. He is to look at his foot at least once a day--if site worsens at all and follow-up immediately. If site does not resolve upon completion of antibiotics then follow-up. - cephALEXin (KEFLEX) 500 MG capsule; Take 1 capsule (500 mg total) by mouth 4 (four) times  daily.  Dispense: 28 capsule; Refill: 0     Signed, [address] [place], [place], [facility] 05/01/2017 11:59 AM

## 2017-05-02 LAB — COMPLETE METABOLIC PANEL WITH GFR
ALT: 19 U/L (ref 9–46)
AST: 20 U/L (ref 10–35)
Albumin: 4.1 g/dL (ref 3.6–5.1)
Alkaline Phosphatase: 38 U/L — ABNORMAL LOW (ref 40–115)
BUN: 23 mg/dL (ref 7–25)
CO2: 21 mmol/L (ref 20–31)
Calcium: 10 mg/dL (ref 8.6–10.3)
Chloride: 101 mmol/L (ref 98–110)
Creat: 1.69 mg/dL — ABNORMAL HIGH (ref 0.70–1.18)
GFR, Est African American: 46 mL/min — ABNORMAL LOW (ref >=60)
GFR, Est Non African American: 39 mL/min — ABNORMAL LOW (ref >=60)
Glucose, Bld: 139 mg/dL — ABNORMAL HIGH (ref 70–99)
Potassium: 4.3 mmol/L (ref 3.5–5.3)
Sodium: 140 mmol/L (ref 135–146)
Total Bilirubin: 0.6 mg/dL (ref 0.2–1.2)
Total Protein: 6.9 g/dL (ref 6.1–8.1)

## 2017-05-02 LAB — MICROALBUMIN, URINE: Microalb, Ur: 16.5 mg/dL

## 2017-05-02 LAB — HEMOGLOBIN A1C
Hgb A1c MFr Bld: 12.4 % — ABNORMAL HIGH (ref <5.7)
Mean Plasma Glucose: 309 mg/dL

## 2017-05-02 LAB — LIPID PANEL
Cholesterol: 156 mg/dL (ref <200)
HDL: 29 mg/dL — ABNORMAL LOW (ref >40)
LDL Cholesterol: 95 mg/dL (ref <100)
Total CHOL/HDL Ratio: 5.4 Ratio — ABNORMAL HIGH (ref <5.0)
Triglycerides: 158 mg/dL — ABNORMAL HIGH (ref <150)
VLDL: 32 mg/dL — ABNORMAL HIGH (ref <30)

## 2017-05-02 LAB — T4, FREE: Free T4: 1 ng/dL (ref 0.8–1.8)

## 2017-05-02 LAB — TSH: TSH: 5.76 mIU/L — ABNORMAL HIGH (ref 0.40–4.50)

## 2017-05-04 NOTE — Progress Notes (Signed)
Chief Complaint  Patient presents with  . Follow-up    PT STATES HE FEELS LIKE HE IS IN A-FIB    History of Present Illness: 76 yo white male with history of aortic stenosis, atrial fibrillation, DM, HTN, hyperlipidemia, PAD and CAD here for cardiac follow up. He was found to have atrial fibrillation in 2012 while in [place]. Cardiac cath in 2012 with occluded RCA which filled from collaterals. He was cardioverted to sinus. He has been followed closely for severe aortic stenosis. Last echo Echo January 2018 with normal LV function, moderately severe AS with mean gradient 32 mmHg, peak gradient 60 mmHg, AVA 0.9 cm2. He has been asymptomatic so no planning yet for AVR. His PAD is followed by Dr. Fletcher Anon. 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 He was seen in the office 11/02/15 for recurrence of atrial fibrillation. This was the first known sustained episode since 2012. He reported med compliance and subsequently underwent DCCV to NSR the following day. He was in atrial fibrillation when seen by Dr. Fletcher Anon in the [facility] clinic May 2018. Lopressor was increased.    He is here today for follow up. He continues to have dyspnea with exertion. He can tell that he is in atrial fib. The dyspnea is mostly with moderate exertion. He is getting through most of the day ok. He has mild LE edema. Taking Lasix 20 mg daily. No dizziness, near syncope or syncope.    Primary Care Physician: Rennis Golden   Past Medical History:  Diagnosis Date  . Aortic stenosis    a. Mod by echo 12/2014.  . Cataract associated with type 2 diabetes mellitus ([place]) 10/06/2013  . CKD (chronic kidney disease), stage III   . Coronary artery disease    a. Cath February 2012 in [place], occluded RCA with collaterals  . Diabetic ulcer of heel ([place]) 07/14/2014  . DM type 2 (diabetes mellitus, type 2) ([place])   . Essential hypertension   . Hyperkalemia   . Hyperlipidemia   . NSTEMI (non-ST  elevated myocardial infarction) ([place]) 12/2010  . Obstructive sleep apnea    2012  . PAF (paroxysmal atrial fibrillation) ([place])    a. dx 2012  s/p DCCV 07/2011  . PVD (peripheral vascular disease) ([place])    a. s/p R popliteal artery stenosis tx with drug-coated balloon 05/2014, followed by Dr. Fletcher Anon.  . Skin cancer    "cut off back of neck X 2; off left upper arm; right wrist, near right shoulder blade; several burned off other areas of my body" (06/08/2014)  . Sleep apnea with use of continuous positive airway pressure (CPAP)    04-11-11 AHI was 32.9 and titrated to 15 cm H20, DME is [facility]  . Subclinical hypothyroidism     Past Surgical History:  Procedure Laterality Date  . ABDOMINAL ANGIOGRAM N/A 06/08/2014   Procedure: ABDOMINAL ANGIOGRAM;  Surgeon: Wellington Hampshire, MD;  Location: [facility] CATH LAB;  Service: Cardiovascular;  Laterality: N/A;  . APPENDECTOMY  1965  . CARDIAC CATHETERIZATION  12/2010  . CARDIOVERSION  07/2011  . CARDIOVERSION N/A 04/18/2014   Procedure: CARDIOVERSION;  Surgeon: Dorothy Spark, MD;  Location: [facility] ENDOSCOPY;  Service: Cardiovascular;  Laterality: N/A;  . CARDIOVERSION N/A 11/03/2015   Procedure: CARDIOVERSION;  Surgeon: Lelon Perla, MD;  Location: [facility] ENDOSCOPY;  Service: Cardiovascular;  Laterality: N/A;  . LOWER EXTREMITY ANGIOGRAM N/A 06/08/2014   Procedure: LOWER EXTREMITY ANGIOGRAM;  Surgeon: Mertie Clause  Fletcher Anon, MD;  Location: [place] CATH LAB;  Service: Cardiovascular;  Laterality: N/A;  . POPLITEAL ARTERY ANGIOPLASTY Right 06/08/2014   Archie Endo 06/08/2014  . SKIN CANCER EXCISION Bilateral    "have had them cut off back of neck X 2; off left upper arm; right wrist, near right shoulder blade" (06/08/2014)    Current Outpatient Prescriptions  Medication Sig Dispense Refill  . aspirin EC 81 MG tablet Take 81 mg by mouth daily.    [name] Kitchen atorvastatin (LIPITOR) 80 MG tablet TAKE 1 TABLET AT BEDTIME 90 tablet 3  . BD PEN NEEDLE NANO U/F 32G X 4 MM MISC USE 4 (FOUR) TIMES  DAILY. 400 each 1  . Blood Glucose Monitoring Suppl (FREESTYLE LITE) DEVI 1 each by Does not apply route 2 (two) times daily. 1 each 0  . cephALEXin (KEFLEX) 500 MG capsule Take 1 capsule (500 mg total) by mouth 4 (four) times daily. 28 capsule 0  . Choline Fenofibrate (TRILIPIX) 135 MG capsule Take 1 capsule (135 mg total) by mouth daily. 90 capsule 3  . docusate sodium (COLACE) 100 MG capsule Take 100 mg by mouth daily as needed for mild constipation.    . fesoterodine (TOVIAZ) 4 MG TB24 tablet Take 4 mg by mouth daily.    . furosemide (LASIX) 20 MG tablet Take 1 tablet (20 mg total) by mouth daily. 90 tablet 3  . glipiZIDE (GLUCOTROL XL) 10 MG 24 hr tablet TAKE 1 TABLET (10 MG TOTAL) BY MOUTH DAILY WITH BREAKFAST. 30 tablet 5  . glucose blood (FREESTYLE LITE) test strip 1 each by Other route 4 (four) times daily -  before meals and at bedtime. 450 each 3  . Insulin Glargine (BASAGLAR KWIKPEN) 100 UNIT/ML SOPN Inject 60 Units into the skin at bedtime.    . insulin lispro (HUMALOG) 100 UNIT/ML KiwkPen Inject 0.05 mLs (5 Units total) into the skin 3 (three) times daily. 15 mL 3  . magnesium oxide (MAG-OX) 400 MG tablet Take 1 tablet (400 mg total) by mouth daily. 30 tablet 11  . metoprolol (LOPRESSOR) 50 MG tablet Take one and one-half tablet by mouth twice a day 90 tablet 6  . mirabegron ER (MYRBETRIQ) 25 MG TB24 tablet TAKE 1 TABLET (25 MG TOTAL) BY MOUTH DAILY. 90 tablet 3  . PRADAXA 150 MG CAPS capsule TAKE 1 CAPSULE EVERY 12    HOURS 180 capsule 3   No current facility-administered medications for this visit.     No Known Allergies  Social History   Social History  . Marital status: Married    Spouse name: [name]  . Number of children: 1  . Years of education: 25   Occupational History  . retired Sales executive   Social History Main Topics  . Smoking status: Former Smoker    Packs/day: 2.00    Years: 14.00    Types: Cigarettes    Quit date: 11/11/1972  . Smokeless  tobacco: Never Used  . Alcohol use No     Comment: quit in 1984  . Drug use: No  . Sexual activity: Not Currently   Other Topics Concern  . 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   Patient has one child and his wife has one child.   Patient is retired.   Patient has a high school education.   Patient is right-handed.   Patient drinks very little caffeine.    Family History  Problem Relation Age of Onset  . Diabetes Mother   . Heart attack Mother   . Hypertension Mother   . Heart attack Father   . Heart failure Father   . Hypertension Father   . Diabetes Father   . Diabetes Sister   . Diabetes Brother   . Diabetes Other   . Diabetes Daughter        TYPE ll  . Heart Problems Daughter   . Hypertension Sister   . Hypertension Brother   . Stroke Brother     Review of Systems:  As stated in the HPI and otherwise negative.   BP 132/82   Pulse (!) 102   Ht 5\' 11"  (1.803 m)   Wt 252 lb (114.3 kg)   BMI 35.15 kg/m   Physical Examination: General: Well developed, well nourished, NAD  HEENT: OP clear, mucus membranes moist  SKIN: warm, dry. No rashes. Neuro: No focal deficits  Musculoskeletal: Muscle strength 5/5 all ext  Psychiatric: Mood and affect normal  Neck: No JVD, no carotid bruits, no thyromegaly, no lymphadenopathy.  Lungs:Clear bilaterally, no wheezes, rhonci, crackles Cardiovascular: Irreg irreg with systolic murmur.   Abdomen:Soft. Bowel sounds present. Non-tender.  Extremities: Trace bilateral lower extremity edema.    Echo: January 2018: Left ventricle: The cavity size was normal. Wall thickness was   increased in a pattern of mild LVH. Systolic function was normal.   The estimated ejection fraction was in the range of 60% to 65%.   Wall motion was normal; there were no regional wall motion   abnormalities. Doppler parameters are consistent with abnormal   left ventricular relaxation  (grade 1 diastolic dysfunction). The   E/e&' ratio is >15, suggesting elevated LV filling pressure. - Aortic valve: Mildly calcified leaflets. Moderate to severe   aortic stenosis. There was trivial regurgitation. Mean gradient   (S): 32 mm Hg. Peak gradient (S): 60 mm Hg. Valve area (VTI): 0.9   cm^2. Valve area (Vmax): 0.96 cm^2. - Mitral valve: Calcified annulus. Mildly thickened leaflets .   There was trivial regurgitation. - Left atrium: Moderately dilated. - Right atrium: The atrium was mildly dilated. - Inferior vena cava: The vessel was normal in size. The   respirophasic diameter changes were in the normal range (= 50%),   consistent with normal central venous pressure. Impressions: - Compared to a prior study in 11/2015, the LVEF is higher at   60-65%. There is now moderate to severe aortic stenosis with an   AVA of around 1.0 cm2. The LVOT measures 2.3 cm (but was   previously measured at 2.0 cm) and this accounts for the   difference in AVA compared to the prior study.  EKG:  EKG is  ordered today. The ekg ordered today demonstrates Atrial fib with rate 102 bpm  Recent Labs: 05/01/2017: ALT 19; BUN 23; Creat 1.69; Potassium 4.3; Sodium 140; TSH 5.76   Lipid Panel    Component Value Date/Time   CHOL 156 05/01/2017 1216   TRIG 158 (H) 05/01/2017 1216   HDL 29 (L) 05/01/2017 1216   CHOLHDL 5.4 (H) 05/01/2017 1216   VLDL 32 (H) 05/01/2017 1216   LDLCALC 95 05/01/2017 1216     Wt Readings from Last 3 Encounters:  05/05/17 252 lb (114.3 kg)  05/01/17 255 lb 3.2 oz (115.8 kg)  03/18/17 252 lb (114.3 kg)     Other studies Reviewed: Additional studies/ records that were reviewed today include: . Review of the above records  demonstrates:    Assessment and Plan:   1. CAD without angina: He is having no chest pain suggestive of angina. Will continue ASA, statin, beta blocker.   2. Aortic stenosis: Moderately severe by echo January 2018 with mean gradient 32 mmHg.  We have had ongoing discussion regarding AVR vs TAVR but he has has been asymptomatic and has not wanted to consider this yet.  Will repeat echo in July 2018. I think his dyspnea is likely a combination of his valve disease and his atrial fib. He is really getting along well though only having dyspnea at peak exertion.   3. ATRIAL FIBRILLATION, paroxysmal: He is back in atrial fibrillation. Rate is controlled but he feels less energy while in atrial fib and is having more exertional dyspnea. Will continue beta blocker for rate control. Continue Pradaxa.  Will arrange DCCV later this week. We may not be able to maintain sinus given his aortic stenosis but will try one more time. (He has gone for several years in sinus after each DCCV).   4. HTN: BP controlled. No changes.   5. HLD: Continue statin. LDL near goal.   6. PAD: Stable. Followed in [facility] clinic by Dr. Fletcher Anon.   Current medicines are reviewed at length with the patient today.  The patient does not have concerns regarding medicines.  The following changes have been made:  no change  Labs/ tests ordered today include:   Orders Placed This Encounter  Procedures  . CBC  . EKG 12-Lead  . ECHOCARDIOGRAM COMPLETE    Disposition:   FU with me in 3 months  Signed, [name], MD 05/05/2017 10:21 AM    [facility] [address], [place], [place]  [address] Phone: [phone]; Fax: [phone]

## 2017-05-05 ENCOUNTER — Ambulatory Visit (INDEPENDENT_AMBULATORY_CARE_PROVIDER_SITE_OTHER): Payer: Medicare Other | Admitting: Cardiovascular Disease

## 2017-05-05 ENCOUNTER — Encounter: Payer: Self-pay | Admitting: *Deleted

## 2017-05-05 VITALS — BP 132/82 | HR 102 | Ht 71.0 in | Wt 252.0 lb

## 2017-05-05 DIAGNOSIS — E785 Hyperlipidemia, unspecified: Secondary | ICD-10-CM | POA: Diagnosis not present

## 2017-05-05 DIAGNOSIS — I480 Paroxysmal atrial fibrillation: Secondary | ICD-10-CM

## 2017-05-05 DIAGNOSIS — I2510 Atherosclerotic heart disease of native coronary artery without angina pectoris: Secondary | ICD-10-CM | POA: Diagnosis not present

## 2017-05-05 DIAGNOSIS — I739 Peripheral vascular disease, unspecified: Secondary | ICD-10-CM

## 2017-05-05 DIAGNOSIS — I10 Essential (primary) hypertension: Secondary | ICD-10-CM | POA: Diagnosis not present

## 2017-05-05 DIAGNOSIS — I350 Nonrheumatic aortic (valve) stenosis: Secondary | ICD-10-CM

## 2017-05-05 NOTE — Patient Instructions (Signed)
Medication Instructions:  Your physician recommends that you continue on your current medications as directed. Please refer to the Current Medication list given to you today.   Labwork: Lab work to be done today--CBC  Testing/Procedures: Your physician has recommended that you have a Cardioversion (DCCV). Electrical Cardioversion uses a jolt of electricity to your heart either through paddles or wired patches attached to your chest. This is a controlled, usually prescheduled, procedure. Defibrillation is done under light anesthesia in the hospital, and you usually go home the day of the procedure. This is done to get your heart back into a normal rhythm. You are not awake for the procedure. Please see the instruction sheet given to you today.  Scheduled for 05/08/17  Your physician has requested that you have an echocardiogram. Echocardiography is a painless test that uses sound waves to create images of your heart. It provides your doctor with information about the size and shape of your heart and how well your heart's chambers and valves are working. This procedure takes approximately one hour. There are no restrictions for this procedure. To be done at the end of July.   Follow-Up: Your physician recommends that you schedule a follow-up appointment in: 3 months.     Any Other Special Instructions Will Be Listed Below (If Applicable).     If you need a refill on your cardiac medications before your next appointment, please call your pharmacy.

## 2017-05-06 ENCOUNTER — Other Ambulatory Visit: Payer: Self-pay

## 2017-05-06 LAB — CBC
Hematocrit: 39.5 % (ref 37.5–51.0)
Hemoglobin: 13.5 g/dL (ref 13.0–17.7)
MCH: 31.7 pg (ref 26.6–33.0)
MCHC: 34.2 g/dL (ref 31.5–35.7)
MCV: 93 fL (ref 79–97)
Platelets: 203 10*3/uL (ref 150–379)
RBC: 4.26 x10E6/uL (ref 4.14–5.80)
RDW: 13.2 % (ref 12.3–15.4)
WBC: 6.4 10*3/uL (ref 3.4–10.8)

## 2017-05-06 MED ORDER — BASAGLAR KWIKPEN 100 UNIT/ML ~~LOC~~ SOPN: 65.0000 [IU] | PEN_INJECTOR | Freq: Every day | SUBCUTANEOUS | 0 refills | Status: DC

## 2017-05-08 ENCOUNTER — Ambulatory Visit (HOSPITAL_COMMUNITY): Payer: Medicare Other | Admitting: Anesthesiology

## 2017-05-08 ENCOUNTER — Ambulatory Visit (HOSPITAL_COMMUNITY)
Admission: RE | Admit: 2017-05-08 | Discharge: 2017-05-08 | Disposition: A | Discharge status: Home / Self Care | Payer: Medicare Other | Source: Ambulatory Visit | Attending: Cardiology | Admitting: Cardiology

## 2017-05-08 ENCOUNTER — Encounter (HOSPITAL_COMMUNITY): Payer: Self-pay | Admitting: *Deleted

## 2017-05-08 ENCOUNTER — Encounter (HOSPITAL_COMMUNITY)
Admission: RE | Disposition: A | Discharge status: Home / Self Care | Payer: Self-pay | Source: Ambulatory Visit | Attending: Cardiology

## 2017-05-08 DIAGNOSIS — Z87891 Personal history of nicotine dependence: Secondary | ICD-10-CM | POA: Insufficient documentation

## 2017-05-08 DIAGNOSIS — I350 Nonrheumatic aortic (valve) stenosis: Secondary | ICD-10-CM | POA: Diagnosis not present

## 2017-05-08 DIAGNOSIS — Z8249 Family history of ischemic heart disease and other diseases of the circulatory system: Secondary | ICD-10-CM | POA: Diagnosis not present

## 2017-05-08 DIAGNOSIS — Z794 Long term (current) use of insulin: Secondary | ICD-10-CM | POA: Diagnosis not present

## 2017-05-08 DIAGNOSIS — N183 Chronic kidney disease, stage 3 (moderate): Secondary | ICD-10-CM | POA: Diagnosis not present

## 2017-05-08 DIAGNOSIS — I480 Paroxysmal atrial fibrillation: Secondary | ICD-10-CM | POA: Diagnosis not present

## 2017-05-08 DIAGNOSIS — I2510 Atherosclerotic heart disease of native coronary artery without angina pectoris: Secondary | ICD-10-CM | POA: Insufficient documentation

## 2017-05-08 DIAGNOSIS — Z79899 Other long term (current) drug therapy: Secondary | ICD-10-CM | POA: Insufficient documentation

## 2017-05-08 DIAGNOSIS — I4891 Unspecified atrial fibrillation: Secondary | ICD-10-CM | POA: Diagnosis not present

## 2017-05-08 DIAGNOSIS — E119 Type 2 diabetes mellitus without complications: Secondary | ICD-10-CM | POA: Insufficient documentation

## 2017-05-08 DIAGNOSIS — Z85828 Personal history of other malignant neoplasm of skin: Secondary | ICD-10-CM | POA: Diagnosis not present

## 2017-05-08 DIAGNOSIS — I252 Old myocardial infarction: Secondary | ICD-10-CM | POA: Diagnosis not present

## 2017-05-08 DIAGNOSIS — Z9989 Dependence on other enabling machines and devices: Secondary | ICD-10-CM | POA: Diagnosis not present

## 2017-05-08 DIAGNOSIS — Z7982 Long term (current) use of aspirin: Secondary | ICD-10-CM | POA: Insufficient documentation

## 2017-05-08 DIAGNOSIS — N189 Chronic kidney disease, unspecified: Secondary | ICD-10-CM | POA: Diagnosis not present

## 2017-05-08 DIAGNOSIS — I739 Peripheral vascular disease, unspecified: Secondary | ICD-10-CM | POA: Diagnosis not present

## 2017-05-08 DIAGNOSIS — G4733 Obstructive sleep apnea (adult) (pediatric): Secondary | ICD-10-CM | POA: Insufficient documentation

## 2017-05-08 DIAGNOSIS — E785 Hyperlipidemia, unspecified: Secondary | ICD-10-CM | POA: Diagnosis not present

## 2017-05-08 DIAGNOSIS — E039 Hypothyroidism, unspecified: Secondary | ICD-10-CM | POA: Diagnosis not present

## 2017-05-08 DIAGNOSIS — L03031 Cellulitis of right toe: Secondary | ICD-10-CM | POA: Diagnosis not present

## 2017-05-08 DIAGNOSIS — I129 Hypertensive chronic kidney disease with stage 1 through stage 4 chronic kidney disease, or unspecified chronic kidney disease: Secondary | ICD-10-CM | POA: Diagnosis not present

## 2017-05-08 DIAGNOSIS — E1122 Type 2 diabetes mellitus with diabetic chronic kidney disease: Secondary | ICD-10-CM | POA: Diagnosis not present

## 2017-05-08 HISTORY — PX: CARDIOVERSION: SHX1299

## 2017-05-08 SURGERY — CARDIOVERSION
Anesthesia: General

## 2017-05-08 MED ORDER — SODIUM CHLORIDE 0.9 % IV SOLN
INTRAVENOUS | Status: DC
  Administered 2017-05-08: 12:00:00 via INTRAVENOUS

## 2017-05-08 MED ORDER — LIDOCAINE 2% (20 MG/ML) 5 ML SYRINGE
INTRAMUSCULAR | Status: DC | PRN
  Administered 2017-05-08: 50 mg via INTRAVENOUS

## 2017-05-08 MED ORDER — PROPOFOL 10 MG/ML IV BOLUS
INTRAVENOUS | Status: DC | PRN
  Administered 2017-05-08: 60 mg via INTRAVENOUS

## 2017-05-08 NOTE — CV Procedure (Signed)
    Cardioversion Note  Steven Maldonado [id number] 01/20/1941  Procedure: DC Cardioversion Indications: atrial fibrillation  Procedure Details Consent: Obtained Time Out: Verified patient identification, verified procedure, site/side was marked, verified correct patient position, special equipment/implants available, Radiology Safety Procedures followed,  medications/allergies/relevent history reviewed, required imaging and test results available.  Performed  The patient has been on adequate anticoagulation.  The patient received IV propofol administered by anesthesia staff for sedation.  Synchronous cardioversion was performed at 120, 150 and 200 joules.  The cardioversion was successful.   Complications: No apparent complications Patient did tolerate procedure well.   Ena Dawley, MD, [facility] 05/08/2017, 12:33 PM

## 2017-05-08 NOTE — Interval H&P Note (Signed)
History and Physical Interval Note:  05/08/2017 12:07 PM  Steven Maldonado  has presented today for surgery, with the diagnosis of a-fib  The various methods of treatment have been discussed with the patient and family. After consideration of risks, benefits and other options for treatment, the patient has consented to  Procedure(s): CARDIOVERSION (N/A) as a surgical intervention .  The patient's history has been reviewed, patient examined, no change in status, stable for surgery.  I have reviewed the patient's chart and labs.  Questions were answered to the patient's satisfaction.     Ena Dawley

## 2017-05-08 NOTE — Discharge Instructions (Signed)
Electrical Cardioversion, Care After °This sheet gives you information about how to care for yourself after your procedure. Your health care provider may also give you more specific instructions. If you have problems or questions, contact your health care provider. °What can I expect after the procedure? °After the procedure, it is common to have: °· Some redness on the skin where the shocks were given. ° °Follow these instructions at home: °· Do not drive for 24 hours if you were given a medicine to help you relax (sedative). °· Take over-the-counter and prescription medicines only as told by your health care provider. °· Ask your health care provider how to check your pulse. Check it often. °· Rest for 48 hours after the procedure or as told by your health care provider. °· Avoid or limit your caffeine use as told by your health care provider. °Contact a health care provider if: °· You feel like your heart is beating too quickly or your pulse is not regular. °· You have a serious muscle cramp that does not go away. °Get help right away if: °· You have discomfort in your chest. °· You are dizzy or you feel faint. °· You have trouble breathing or you are short of breath. °· Your speech is slurred. °· You have trouble moving an arm or leg on one side of your body. °· Your fingers or toes turn cold or blue. °This information is not intended to replace advice given to you by your health care provider. Make sure you discuss any questions you have with your health care provider. °Document Released: 08/18/2013 Document Revised: 05/31/2016 Document Reviewed: 05/03/2016 °Elsevier Interactive Patient Education © 2018 [employer] ° °

## 2017-05-08 NOTE — Anesthesia Postprocedure Evaluation (Signed)
Anesthesia Post Note  Patient: [name]  Procedure(s) Performed: Procedure(s) (LRB): CARDIOVERSION (N/A)     Patient location during evaluation: PACU Anesthesia Type: General Level of consciousness: awake and alert Pain management: pain level controlled Vital Signs Assessment: post-procedure vital signs reviewed and stable Respiratory status: spontaneous breathing, nonlabored ventilation, respiratory function stable and patient connected to nasal cannula oxygen Cardiovascular status: blood pressure returned to baseline and stable Postop Assessment: no signs of nausea or vomiting Anesthetic complications: no    Last Vitals:  Vitals:   05/08/17 1235 05/08/17 1250  BP: 110/61 (!) 114/41  Pulse: 73   Resp: 13 18  Temp:      Last Pain:  Vitals:   05/08/17 1158  TempSrc: Oral                 Effie Berkshire

## 2017-05-08 NOTE — Anesthesia Preprocedure Evaluation (Addendum)
Anesthesia Evaluation  Patient identified by MRN, date of birth, ID band Patient awake    Reviewed: Allergy & Precautions, NPO status , Patient's Chart, lab work & pertinent test results  Airway Mallampati: II  TM Distance: >3 FB Neck ROM: Full    Dental  (+) Lower Dentures, Upper Dentures   Pulmonary sleep apnea and Continuous Positive Airway Pressure Ventilation , former smoker,    breath sounds clear to auscultation       Cardiovascular hypertension, + CAD, + Past MI and + Peripheral Vascular Disease  + dysrhythmias Atrial Fibrillation + Valvular Problems/Murmurs AS  Rhythm:Irregular     Neuro/Psych negative neurological ROS  negative psych ROS   GI/Hepatic negative GI ROS, Neg liver ROS,   Endo/Other  diabetes, Type 2, Oral Hypoglycemic AgentsHypothyroidism   Renal/GU CRFRenal disease  negative genitourinary   Musculoskeletal negative musculoskeletal ROS (+)   Abdominal   Peds negative pediatric ROS (+)  Hematology negative hematology ROS (+)   Anesthesia Other Findings - HLD  Reproductive/Obstetrics negative OB ROS                            Lab Results  Component Value Date   WBC 6.4 05/05/2017   HGB 13.5 05/05/2017   HCT 39.5 05/05/2017   MCV 93 05/05/2017   PLT 203 05/05/2017   Lab Results  Component Value Date   INR 1.02 06/08/2014   INR 1.36 08/02/2011    Echo - Left ventricle: The cavity size was normal. Wall thickness was   increased in a pattern of mild LVH. Systolic function was normal.   The estimated ejection fraction was in the range of 60% to 65%.   Wall motion was normal; there were no regional wall motion   abnormalities. Doppler parameters are consistent with abnormal   left ventricular relaxation (grade 1 diastolic dysfunction). The   E/e&' ratio is >15, suggesting elevated LV filling pressure. - Aortic valve: Mildly calcified leaflets. Moderate to severe    aortic stenosis. There was trivial regurgitation. Mean gradient   (S): 32 mm Hg. Peak gradient (S): 60 mm Hg. Valve area (VTI): 0.9   cm^2. Valve area (Vmax): 0.96 cm^2. - Mitral valve: Calcified annulus. Mildly thickened leaflets .   There was trivial regurgitation. - Left atrium: Moderately dilated. - Right atrium: The atrium was mildly dilated. - Inferior vena cava: The vessel was normal in size. The   respirophasic diameter changes were in the normal range (= 50%),   consistent with normal central venous pressure.  Anesthesia Physical Anesthesia Plan  ASA: III  Anesthesia Plan: General   Post-op Pain Management:    Induction: Intravenous  PONV Risk Score and Plan: Propofol  Airway Management Planned: Mask  Additional Equipment:   Intra-op Plan:   Post-operative Plan:   Informed Consent: I have reviewed the patients History and Physical, chart, labs and discussed the procedure including the risks, benefits and alternatives for the proposed anesthesia with the patient or authorized representative who has indicated his/her understanding and acceptance.     Plan Discussed with:   Anesthesia Plan Comments:         Anesthesia Quick Evaluation

## 2017-05-08 NOTE — Anesthesia Procedure Notes (Signed)
Procedure Name: MAC Date/Time: 05/08/2017 12:21 PM Performed by: Mervyn Gay Pre-anesthesia Checklist: Patient identified, Patient being monitored, Timeout performed, Emergency Drugs available and Suction available Patient Re-evaluated:Patient Re-evaluated prior to inductionOxygen Delivery Method: Nasal cannula Number of attempts: 1 Placement Confirmation: positive ETCO2 Dental Injury: Teeth and Oropharynx as per pre-operative assessment

## 2017-05-08 NOTE — Transfer of Care (Signed)
Immediate Anesthesia Transfer of Care Note  Patient: Steven Maldonado  Procedure(s) Performed: Procedure(s): CARDIOVERSION (N/A)  Patient Location: Endoscopy Unit  Anesthesia Type:MAC  Level of Consciousness: sedated and patient cooperative  Airway & Oxygen Therapy: Patient Spontanous Breathing and Patient connected to nasal cannula oxygen  Post-op Assessment: Report given to RN and Post -op Vital signs reviewed and stable  Post vital signs: Reviewed and stable  Last Vitals:  Vitals:   05/08/17 1227 05/08/17 1228  BP:    Pulse: (!) 107   Resp: (!) 22 19  Temp:      Last Pain:  Vitals:   05/08/17 1158  TempSrc: Oral         Complications: No apparent anesthesia complications

## 2017-05-08 NOTE — H&P (View-Only) (Signed)
Patient ID: Steven Maldonado MRN: [id number], DOB: 06-Apr-1941, 76 y.o. Date of Encounter: 05/01/2017, 11:59 AM    Chief Complaint:  Chief Complaint  Patient presents with  . ace/arb    f/u  . diabetes f/u     HPI: 76 y.o. year old male here to f/u high Blood Sugar, Uncontrolled Diabetes.   08/21/2016: At his last routine office visit his A1c result came back 11.4.  This was on 08/21/2016.  At that time I told him to increase his Basaglar to 60 units. Also told him to document fasting morning blood sugar readings on a log form and bring this with him to office visit with me in one week.  Today he reports that he did increase the Basaglar to the 60 units.  Today he does bring in blood sugar log sheet for me to review. Fasting morning blood sugar readings have been: 172 179  160  167  140  150 135 129  States that his wife is doing weight watchers and has lost 10 pounds. He asked me if it would be okay for him to eat the same thing she is eating and follow that. Says that would be easier than eating 2 separate meals. However I remember him discussing this with me at the last office visit and I had already encouraged him at that time to eat what she was eating and follow the Weight Watchers diet. Today he also makes mention that he has seen 4 different dietitians in the past and "knows what he is supposed to eat-- it's just a matter of doing it."   today I also discussed with him.snacking and whether he even if he eats meals with her whether he is going to be sneaking other foods in addition.   he states that they have gotten rid of all snack foods and that there are no snack foods in the house. Asked him if he will drive to the store and get some for himself but he says he will not.   asked him if he thinks he needs to have a follow-up visit with me just in a couple weeks to make sure that he is being compliant. He says that he will be compliant and that he needs to do this for  him, not for me. I agreed but just wanted to make sure that he will actually stick with this.  He is going to continue the Basaglar at the current dose of 60 units. He is going to continue to document blood sugars and will call me if he is getting higher readings than where they are.   05/01/2017: Today he reports that he is sorry that he has not been here for his routine visit and knows that he is overdue for visit with me.  Says that he ended up staying in [place] for 4 months house sitting for his nephew. Says that he has one other thing he wanted to make sure to let me now--says that he is in A. fib and HEENT Arty knows that--- says that he recently saw cardiology and their EKG showed that he was in A. Fib. Again today he tells me that he plans to make diet changes. He acts as if he does not even remember the conversation that we had at his last office visit and all that is documented in the note above--today states that he wants to live to be a grandfather that he wants to take better care of himself  and that he has gone to lots of nutritionist and his wife has been doing weight watchers and he knows what to do and says that he is going to start doing it---!!!!!! States that he is taking the insulin as directed. He is also taking metoprolol and Lasix as directed. Having no lightheadedness. Taking Lipitor as directed. No myalgias or right upper quadrant pain.      Home Meds:   Outpatient Medications Prior to Visit  Medication Sig Dispense Refill  . aspirin EC 81 MG tablet Take 81 mg by mouth daily.    [name] Kitchen atorvastatin (LIPITOR) 80 MG tablet TAKE 1 TABLET AT BEDTIME 90 tablet 3  . BD PEN NEEDLE NANO U/F 32G X 4 MM MISC USE 4 (FOUR) TIMES DAILY. 400 each 1  . Blood Glucose Monitoring Suppl (FREESTYLE LITE) DEVI 1 each by Does not apply route 2 (two) times daily. 1 each 0  . Choline Fenofibrate (TRILIPIX) 135 MG capsule Take 1 capsule (135 mg total) by mouth daily. 90 capsule 3  . docusate  sodium (COLACE) 100 MG capsule Take 100 mg by mouth daily as needed for mild constipation.    . fesoterodine (TOVIAZ) 4 MG TB24 tablet Take 4 mg by mouth daily.    . furosemide (LASIX) 20 MG tablet Take 1 tablet (20 mg total) by mouth daily. 90 tablet 3  . glipiZIDE (GLUCOTROL XL) 10 MG 24 hr tablet TAKE 1 TABLET (10 MG TOTAL) BY MOUTH DAILY WITH BREAKFAST. 30 tablet 5  . glucose blood (FREESTYLE LITE) test strip 1 each by Other route 4 (four) times daily -  before meals and at bedtime. 450 each 3  . Insulin Glargine (BASAGLAR KWIKPEN) 100 UNIT/ML SOPN Inject 60 Units into the skin at bedtime.    . insulin lispro (HUMALOG) 100 UNIT/ML KiwkPen Inject 0.05 mLs (5 Units total) into the skin 3 (three) times daily. 15 mL 3  . magnesium oxide (MAG-OX) 400 MG tablet Take 1 tablet (400 mg total) by mouth daily. 30 tablet 11  . metoprolol (LOPRESSOR) 50 MG tablet Take one and one-half tablet by mouth twice a day 90 tablet 6  . mirabegron ER (MYRBETRIQ) 25 MG TB24 tablet TAKE 1 TABLET (25 MG TOTAL) BY MOUTH DAILY. 90 tablet 3  . PRADAXA 150 MG CAPS capsule TAKE 1 CAPSULE EVERY 12    HOURS 180 capsule 3   No facility-administered medications prior to visit.     Allergies: No Known Allergies    Review of Systems: See HPI for pertinent ROS. All other ROS negative.    Physical Exam: Blood pressure 130/60, pulse 87, temperature 98 F (36.7 C), temperature source Oral, resp. rate 16, height 5\' 11"  (1.803 m), weight 255 lb 3.2 oz (115.8 kg), SpO2 98 %., Body mass index is 35.59 kg/m. General:  Obese WM. Appears in no acute distress. Neck: Supple. No thyromegaly. No lymphadenopathy. Lungs: Clear bilaterally to auscultation without wheezes, rales, or rhonchi. Breathing is unlabored. Heart: Irregular rhythm. Abdomen: Soft, non-tender, non-distended with normoactive bowel sounds. No hepatomegaly. No rebound/guarding. No obvious abdominal masses. Msk:  Strength and tone normal for age. Extremities/Skin:  Warm and dry. Diabetic foot exam: Right second toe--along proximal edge of nail--- there is area of erythema--- midline portion of this-- there is a tiny area (64mm) of white coloration. Neuro: Alert and oriented X 3. Moves all extremities spontaneously. Gait is normal. CNII-XII grossly in tact. Psych:  Responds to questions appropriately with a normal affect.  ASSESSMENT AND PLAN:  76 y.o. year old male with   1. Coronary artery disease involving native coronary artery of native heart without angina pectoris 05/01/2017---Managed by Cardiology  2. Aortic valve stenosis, etiology of cardiac valve disease unspecified 05/01/2017---Managed by Cardiology   3. Atrial fibrillation, unspecified type ([place]) 05/01/2017---Managed by Cardiology   4. PAD (peripheral artery disease) ([place]) 05/01/2017---Managed by Cardiology   5. Essential hypertension 05/01/2017--Blood pressure is at goal. Continue current medications. Check lab to monitor. - COMPLETE METABOLIC PANEL WITH GFR  6. OSA on CPAP 05/01/2017--stable, on CPAP  7. Subclinical hypothyroidism 05/01/2017--recheck lab to monitor - TSH - T4, free  8. Type 2 diabetes mellitus without complication, without long-term current use of insulin ([place]) 05/01/2017-- Will check the following labs to monitor. Plan for routine visit in 3 months but will have him follow-up sooner if labs are abnormal/A1c is uncontrolled. - COMPLETE METABOLIC PANEL WITH GFR - Lipid panel - Hemoglobin A1c - Microalbumin, urine  9. Cellulitis of toe of right foot 05/01/2017-- Reminded him of the importance of proper foot care given his diabetes. Told him to start the antibiotic immediately and take as directed. He is to look at his foot at least once a day--if site worsens at all and follow-up immediately. If site does not resolve upon completion of antibiotics then follow-up. - cephALEXin (KEFLEX) 500 MG capsule; Take 1 capsule (500 mg total) by mouth 4 (four) times  daily.  Dispense: 28 capsule; Refill: 0     Signed, [address] [place], [place], [facility] 05/01/2017 11:59 AM

## 2017-05-08 NOTE — H&P (View-Only) (Signed)
Chief Complaint  Patient presents with  . Follow-up    PT STATES HE FEELS LIKE HE IS IN A-FIB    History of Present Illness: 76 yo white male with history of aortic stenosis, atrial fibrillation, DM, HTN, hyperlipidemia, PAD and CAD here for cardiac follow up. He was found to have atrial fibrillation in 2012 while in [place]. Cardiac cath in 2012 with occluded RCA which filled from collaterals. He was cardioverted to sinus. He has been followed closely for severe aortic stenosis. Last echo Echo January 2018 with normal LV function, moderately severe AS with mean gradient 32 mmHg, peak gradient 60 mmHg, AVA 0.9 cm2. He has been asymptomatic so no planning yet for AVR. His PAD is followed by Dr. Fletcher Anon. 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 He was seen in the office 11/02/15 for recurrence of atrial fibrillation. This was the first known sustained episode since 2012. He reported med compliance and subsequently underwent DCCV to NSR the following day. He was in atrial fibrillation when seen by Dr. Fletcher Anon in the [facility] clinic May 2018. Lopressor was increased.    He is here today for follow up. He continues to have dyspnea with exertion. He can tell that he is in atrial fib. The dyspnea is mostly with moderate exertion. He is getting through most of the day ok. He has mild LE edema. Taking Lasix 20 mg daily. No dizziness, near syncope or syncope.    Primary Care Physician: Rennis Golden   Past Medical History:  Diagnosis Date  . Aortic stenosis    a. Mod by echo 12/2014.  . Cataract associated with type 2 diabetes mellitus ([place]) 10/06/2013  . CKD (chronic kidney disease), stage III   . Coronary artery disease    a. Cath February 2012 in [place], occluded RCA with collaterals  . Diabetic ulcer of heel ([place]) 07/14/2014  . DM type 2 (diabetes mellitus, type 2) ([place])   . Essential hypertension   . Hyperkalemia   . Hyperlipidemia   . NSTEMI (non-ST  elevated myocardial infarction) ([place]) 12/2010  . Obstructive sleep apnea    2012  . PAF (paroxysmal atrial fibrillation) ([place])    a. dx 2012  s/p DCCV 07/2011  . PVD (peripheral vascular disease) ([place])    a. s/p R popliteal artery stenosis tx with drug-coated balloon 05/2014, followed by Dr. Fletcher Anon.  . Skin cancer    "cut off back of neck X 2; off left upper arm; right wrist, near right shoulder blade; several burned off other areas of my body" (06/08/2014)  . Sleep apnea with use of continuous positive airway pressure (CPAP)    04-11-11 AHI was 32.9 and titrated to 15 cm H20, DME is [facility]  . Subclinical hypothyroidism     Past Surgical History:  Procedure Laterality Date  . ABDOMINAL ANGIOGRAM N/A 06/08/2014   Procedure: ABDOMINAL ANGIOGRAM;  Surgeon: Wellington Hampshire, MD;  Location: [facility] CATH LAB;  Service: Cardiovascular;  Laterality: N/A;  . APPENDECTOMY  1965  . CARDIAC CATHETERIZATION  12/2010  . CARDIOVERSION  07/2011  . CARDIOVERSION N/A 04/18/2014   Procedure: CARDIOVERSION;  Surgeon: Dorothy Spark, MD;  Location: [facility] ENDOSCOPY;  Service: Cardiovascular;  Laterality: N/A;  . CARDIOVERSION N/A 11/03/2015   Procedure: CARDIOVERSION;  Surgeon: Lelon Perla, MD;  Location: [facility] ENDOSCOPY;  Service: Cardiovascular;  Laterality: N/A;  . LOWER EXTREMITY ANGIOGRAM N/A 06/08/2014   Procedure: LOWER EXTREMITY ANGIOGRAM;  Surgeon: Mertie Clause  Fletcher Anon, MD;  Location: [facility] CATH LAB;  Service: Cardiovascular;  Laterality: N/A;  . POPLITEAL ARTERY ANGIOPLASTY Right 06/08/2014   Archie Endo 06/08/2014  . SKIN CANCER EXCISION Bilateral    "have had them cut off back of neck X 2; off left upper arm; right wrist, near right shoulder blade" (06/08/2014)    Current Outpatient Prescriptions  Medication Sig Dispense Refill  . aspirin EC 81 MG tablet Take 81 mg by mouth daily.    [name] Kitchen atorvastatin (LIPITOR) 80 MG tablet TAKE 1 TABLET AT BEDTIME 90 tablet 3  . BD PEN NEEDLE NANO U/F 32G X 4 MM MISC USE 4 (FOUR) TIMES  DAILY. 400 each 1  . Blood Glucose Monitoring Suppl (FREESTYLE LITE) DEVI 1 each by Does not apply route 2 (two) times daily. 1 each 0  . cephALEXin (KEFLEX) 500 MG capsule Take 1 capsule (500 mg total) by mouth 4 (four) times daily. 28 capsule 0  . Choline Fenofibrate (TRILIPIX) 135 MG capsule Take 1 capsule (135 mg total) by mouth daily. 90 capsule 3  . docusate sodium (COLACE) 100 MG capsule Take 100 mg by mouth daily as needed for mild constipation.    . fesoterodine (TOVIAZ) 4 MG TB24 tablet Take 4 mg by mouth daily.    . furosemide (LASIX) 20 MG tablet Take 1 tablet (20 mg total) by mouth daily. 90 tablet 3  . glipiZIDE (GLUCOTROL XL) 10 MG 24 hr tablet TAKE 1 TABLET (10 MG TOTAL) BY MOUTH DAILY WITH BREAKFAST. 30 tablet 5  . glucose blood (FREESTYLE LITE) test strip 1 each by Other route 4 (four) times daily -  before meals and at bedtime. 450 each 3  . Insulin Glargine (BASAGLAR KWIKPEN) 100 UNIT/ML SOPN Inject 60 Units into the skin at bedtime.    . insulin lispro (HUMALOG) 100 UNIT/ML KiwkPen Inject 0.05 mLs (5 Units total) into the skin 3 (three) times daily. 15 mL 3  . magnesium oxide (MAG-OX) 400 MG tablet Take 1 tablet (400 mg total) by mouth daily. 30 tablet 11  . metoprolol (LOPRESSOR) 50 MG tablet Take one and one-half tablet by mouth twice a day 90 tablet 6  . mirabegron ER (MYRBETRIQ) 25 MG TB24 tablet TAKE 1 TABLET (25 MG TOTAL) BY MOUTH DAILY. 90 tablet 3  . PRADAXA 150 MG CAPS capsule TAKE 1 CAPSULE EVERY 12    HOURS 180 capsule 3   No current facility-administered medications for this visit.     No Known Allergies  Social History   Social History  . Marital status: Married    Spouse name: [name]  . Number of children: 1  . Years of education: 61   Occupational History  . retired Sales executive   Social History Main Topics  . Smoking status: Former Smoker    Packs/day: 2.00    Years: 14.00    Types: Cigarettes    Quit date: 11/11/1972  . Smokeless  tobacco: Never Used  . Alcohol use No     Comment: quit in 1984  . Drug use: No  . Sexual activity: Not Currently   Other Topics Concern  . 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   Patient has one child and his wife has one child.   Patient is retired.   Patient has a high school education.   Patient is right-handed.   Patient drinks very little caffeine.    Family History  Problem Relation Age of Onset  . Diabetes Mother   . Heart attack Mother   . Hypertension Mother   . Heart attack Father   . Heart failure Father   . Hypertension Father   . Diabetes Father   . Diabetes Sister   . Diabetes Brother   . Diabetes Other   . Diabetes Daughter        TYPE ll  . Heart Problems Daughter   . Hypertension Sister   . Hypertension Brother   . Stroke Brother     Review of Systems:  As stated in the HPI and otherwise negative.   BP 132/82   Pulse (!) 102   Ht 5\' 11"  (1.803 m)   Wt 252 lb (114.3 kg)   BMI 35.15 kg/m   Physical Examination: General: Well developed, well nourished, NAD  HEENT: OP clear, mucus membranes moist  SKIN: warm, dry. No rashes. Neuro: No focal deficits  Musculoskeletal: Muscle strength 5/5 all ext  Psychiatric: Mood and affect normal  Neck: No JVD, no carotid bruits, no thyromegaly, no lymphadenopathy.  Lungs:Clear bilaterally, no wheezes, rhonci, crackles Cardiovascular: Irreg irreg with systolic murmur.   Abdomen:Soft. Bowel sounds present. Non-tender.  Extremities: Trace bilateral lower extremity edema.    Echo: January 2018: Left ventricle: The cavity size was normal. Wall thickness was   increased in a pattern of mild LVH. Systolic function was normal.   The estimated ejection fraction was in the range of 60% to 65%.   Wall motion was normal; there were no regional wall motion   abnormalities. Doppler parameters are consistent with abnormal   left ventricular relaxation  (grade 1 diastolic dysfunction). The   E/e&' ratio is >15, suggesting elevated LV filling pressure. - Aortic valve: Mildly calcified leaflets. Moderate to severe   aortic stenosis. There was trivial regurgitation. Mean gradient   (S): 32 mm Hg. Peak gradient (S): 60 mm Hg. Valve area (VTI): 0.9   cm^2. Valve area (Vmax): 0.96 cm^2. - Mitral valve: Calcified annulus. Mildly thickened leaflets .   There was trivial regurgitation. - Left atrium: Moderately dilated. - Right atrium: The atrium was mildly dilated. - Inferior vena cava: The vessel was normal in size. The   respirophasic diameter changes were in the normal range (= 50%),   consistent with normal central venous pressure. Impressions: - Compared to a prior study in 11/2015, the LVEF is higher at   60-65%. There is now moderate to severe aortic stenosis with an   AVA of around 1.0 cm2. The LVOT measures 2.3 cm (but was   previously measured at 2.0 cm) and this accounts for the   difference in AVA compared to the prior study.  EKG:  EKG is  ordered today. The ekg ordered today demonstrates Atrial fib with rate 102 bpm  Recent Labs: 05/01/2017: ALT 19; BUN 23; Creat 1.69; Potassium 4.3; Sodium 140; TSH 5.76   Lipid Panel    Component Value Date/Time   CHOL 156 05/01/2017 1216   TRIG 158 (H) 05/01/2017 1216   HDL 29 (L) 05/01/2017 1216   CHOLHDL 5.4 (H) 05/01/2017 1216   VLDL 32 (H) 05/01/2017 1216   LDLCALC 95 05/01/2017 1216     Wt Readings from Last 3 Encounters:  05/05/17 252 lb (114.3 kg)  05/01/17 255 lb 3.2 oz (115.8 kg)  03/18/17 252 lb (114.3 kg)     Other studies Reviewed: Additional studies/ records that were reviewed today include: . Review of the above records  demonstrates:    Assessment and Plan:   1. CAD without angina: He is having no chest pain suggestive of angina. Will continue ASA, statin, beta blocker.   2. Aortic stenosis: Moderately severe by echo January 2018 with mean gradient 32 mmHg.  We have had ongoing discussion regarding AVR vs TAVR but he has has been asymptomatic and has not wanted to consider this yet.  Will repeat echo in July 2018. I think his dyspnea is likely a combination of his valve disease and his atrial fib. He is really getting along well though only having dyspnea at peak exertion.   3. ATRIAL FIBRILLATION, paroxysmal: He is back in atrial fibrillation. Rate is controlled but he feels less energy while in atrial fib and is having more exertional dyspnea. Will continue beta blocker for rate control. Continue Pradaxa.  Will arrange DCCV later this week. We may not be able to maintain sinus given his aortic stenosis but will try one more time. (He has gone for several years in sinus after each DCCV).   4. HTN: BP controlled. No changes.   5. HLD: Continue statin. LDL near goal.   6. PAD: Stable. Followed in [facility] clinic by Dr. Fletcher Anon.   Current medicines are reviewed at length with the patient today.  The patient does not have concerns regarding medicines.  The following changes have been made:  no change  Labs/ tests ordered today include:   Orders Placed This Encounter  Procedures  . CBC  . EKG 12-Lead  . ECHOCARDIOGRAM COMPLETE    Disposition:   FU with me in 3 months  Signed, [name], MD 05/05/2017 10:21 AM    [facility] [address], [place], [place]  [address] Phone: [phone]; Fax: [phone]

## 2017-05-11 ENCOUNTER — Encounter (HOSPITAL_COMMUNITY): Payer: Self-pay | Admitting: Cardiology

## 2017-05-13 ENCOUNTER — Encounter (HOSPITAL_COMMUNITY): Payer: Self-pay | Admitting: Cardiology

## 2017-06-09 ENCOUNTER — Other Ambulatory Visit (HOSPITAL_COMMUNITY): Payer: Medicare Other

## 2017-06-15 ENCOUNTER — Other Ambulatory Visit: Payer: Self-pay | Admitting: Physician Assistant

## 2017-06-16 ENCOUNTER — Ambulatory Visit (HOSPITAL_COMMUNITY): Payer: Medicare Other | Attending: Internal Medicine

## 2017-06-16 ENCOUNTER — Other Ambulatory Visit: Payer: Self-pay

## 2017-06-16 ENCOUNTER — Telehealth: Payer: Self-pay | Admitting: Physician Assistant

## 2017-06-16 ENCOUNTER — Encounter (HOSPITAL_COMMUNITY): Payer: Self-pay

## 2017-06-16 DIAGNOSIS — I350 Nonrheumatic aortic (valve) stenosis: Secondary | ICD-10-CM

## 2017-06-16 DIAGNOSIS — G4733 Obstructive sleep apnea (adult) (pediatric): Secondary | ICD-10-CM | POA: Insufficient documentation

## 2017-06-16 DIAGNOSIS — E1122 Type 2 diabetes mellitus with diabetic chronic kidney disease: Secondary | ICD-10-CM | POA: Insufficient documentation

## 2017-06-16 DIAGNOSIS — I4891 Unspecified atrial fibrillation: Secondary | ICD-10-CM | POA: Diagnosis not present

## 2017-06-16 DIAGNOSIS — N189 Chronic kidney disease, unspecified: Secondary | ICD-10-CM | POA: Diagnosis not present

## 2017-06-16 DIAGNOSIS — Z87891 Personal history of nicotine dependence: Secondary | ICD-10-CM | POA: Insufficient documentation

## 2017-06-16 DIAGNOSIS — E785 Hyperlipidemia, unspecified: Secondary | ICD-10-CM | POA: Diagnosis not present

## 2017-06-16 DIAGNOSIS — I129 Hypertensive chronic kidney disease with stage 1 through stage 4 chronic kidney disease, or unspecified chronic kidney disease: Secondary | ICD-10-CM | POA: Insufficient documentation

## 2017-06-16 DIAGNOSIS — I2510 Atherosclerotic heart disease of native coronary artery without angina pectoris: Secondary | ICD-10-CM | POA: Insufficient documentation

## 2017-06-16 MED ORDER — PERFLUTREN LIPID MICROSPHERE
1.0000 mL | INTRAVENOUS | Status: AC | PRN
  Administered 2017-06-16: 2 mL via INTRAVENOUS

## 2017-06-16 NOTE — Progress Notes (Signed)
Mr. Barnhart presented for echocardiogram this afternoon. EKG from our echo machine showed A Fib. HR ranged from 110-140 BPM. He was asymptomatic during the exam. DOD (Dr. Rayann Heman) was notified and he said that as long as he does not have suggested me to send Dr. Angelena Form a note to let him know and he is stable to go home.  Wyatt Mage, [place] 06/16/2017

## 2017-06-16 NOTE — Telephone Encounter (Signed)
Pt needs humalog called in to [facility].

## 2017-06-16 NOTE — Telephone Encounter (Signed)
Refill appropriate 

## 2017-06-17 NOTE — Progress Notes (Signed)
Notes recorded by Burnell Blanks, MD on 06/17/2017 at 1:20 PM EDT Can we let him know that his echo is overall unchanged. He is known to have aortic stenosis. LV function is normal. Can we see if he can come in to see me on Friday at 11:40? This is a TAVR slot which we can open for him. I would like to see him to discuss his valve and his atrial fib as rate was up day of echo. Chris  LM for pt to cb about results and to schedule with Dr Angelena Form for Friday.

## 2017-06-17 NOTE — Progress Notes (Signed)
Called pt to see how he was doing as requested by Dr Angelena Form. Left pt a message to call back.

## 2017-06-17 NOTE — Progress Notes (Signed)
Can we check on Steven Maldonado today and see how he is feeling? chris

## 2017-06-18 ENCOUNTER — Telehealth: Payer: Self-pay | Admitting: Cardiovascular Disease

## 2017-06-18 MED ORDER — INSULIN LISPRO 100 UNIT/ML (KWIKPEN): 5.0000 [IU] | PEN_INJECTOR | Freq: Three times a day (TID) | SUBCUTANEOUS | 3 refills | Status: DC

## 2017-06-18 NOTE — Telephone Encounter (Signed)
I spoke with pt's wife and scheduled pt to see Dr. Angelena Form on 06/20/17 at 11:40

## 2017-06-18 NOTE — Telephone Encounter (Signed)
Rx filled

## 2017-06-18 NOTE — Telephone Encounter (Signed)
Steven Maldonado is calling because she was told to call Triage so that Steven Maldonado could come in to see Dr. Angelena Form on Friday . Please call

## 2017-06-18 NOTE — Telephone Encounter (Signed)
Left message to call back  

## 2017-06-20 ENCOUNTER — Ambulatory Visit (INDEPENDENT_AMBULATORY_CARE_PROVIDER_SITE_OTHER): Payer: Medicare Other | Admitting: Cardiovascular Disease

## 2017-06-20 ENCOUNTER — Encounter: Payer: Self-pay | Admitting: Cardiovascular Disease

## 2017-06-20 ENCOUNTER — Ambulatory Visit
Admission: RE | Admit: 2017-06-20 | Discharge: 2017-06-20 | Disposition: A | Discharge status: Home / Self Care | Payer: Medicare Other | Source: Ambulatory Visit | Attending: Cardiovascular Disease | Admitting: Cardiovascular Disease

## 2017-06-20 VITALS — BP 130/80 | HR 97 | Ht 71.0 in | Wt 257.2 lb

## 2017-06-20 DIAGNOSIS — I480 Paroxysmal atrial fibrillation: Secondary | ICD-10-CM

## 2017-06-20 DIAGNOSIS — I350 Nonrheumatic aortic (valve) stenosis: Secondary | ICD-10-CM | POA: Diagnosis not present

## 2017-06-20 DIAGNOSIS — I739 Peripheral vascular disease, unspecified: Secondary | ICD-10-CM | POA: Diagnosis not present

## 2017-06-20 DIAGNOSIS — E785 Hyperlipidemia, unspecified: Secondary | ICD-10-CM | POA: Diagnosis not present

## 2017-06-20 DIAGNOSIS — I2510 Atherosclerotic heart disease of native coronary artery without angina pectoris: Secondary | ICD-10-CM | POA: Diagnosis not present

## 2017-06-20 DIAGNOSIS — I10 Essential (primary) hypertension: Secondary | ICD-10-CM | POA: Diagnosis not present

## 2017-06-20 DIAGNOSIS — R0602 Shortness of breath: Secondary | ICD-10-CM | POA: Diagnosis not present

## 2017-06-20 MED ORDER — AMIODARONE HCL 200 MG PO TABS: 200.0000 mg | ORAL_TABLET | Freq: Two times a day (BID) | ORAL | 3 refills | Status: DC

## 2017-06-20 NOTE — Progress Notes (Signed)
Chief Complaint  Patient presents with  . Follow-up    atrial fibrillation    History of Present Illness: 76 yo white male with history of severe aortic stenosis, atrial fibrillation, DM, HTN, hyperlipidemia, PAD and CAD here for cardiac follow up. He was found to have atrial fibrillation in 2012. Cardiac cath in 2012 with occluded RCA which filled from collaterals. He was cardioverted to sinus. He has been followed closely for severe aortic stenosis. Most recent echo in August 2018 with heavily calcified aortic valve that opened poorly with mean gradient 26 mmHg, peak gradient 45 mmHg. LV function is normal. His PAD is followed by Dr. Fletcher Anon. 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 He was seen in the office 11/02/15 for recurrence of atrial fibrillation. This was the first known sustained episode since 2012. He reported med compliance and subsequently underwent DCCV to NSR the following day. He was in atrial fibrillation when seen by Dr. Fletcher Anon in the [facility] clinic May 2018. Lopressor was increased. He was cardioverted to sinus June 2018. When he came in for his echo last week, his HR was over 120 bpm and he was back in atrial fibrillation.    He is here today for follow up. The patient denies any chest pain, lower extremity edema, orthopnea, PND, dizziness, near syncope or syncope. He is having more dyspnea. He can tell that he is in atrial fib. He has some palpitations.   Primary Care Physician: Rennis Golden   Past Medical History:  Diagnosis Date  . Aortic stenosis    a. Mod by echo 12/2014.  . Cataract associated with type 2 diabetes mellitus ([place]) 10/06/2013  . CKD (chronic kidney disease), stage III   . Coronary artery disease    a. Cath February 2012 in [facility], occluded RCA with collaterals  . Diabetic ulcer of heel ([place]) 07/14/2014  . DM type 2 (diabetes mellitus, type 2) ([place])   . Essential hypertension   . Hyperkalemia   . Hyperlipidemia     . NSTEMI (non-ST elevated myocardial infarction) ([place]) 12/2010  . Obstructive sleep apnea    2012  . PAF (paroxysmal atrial fibrillation) ([place])    a. dx 2012  s/p DCCV 07/2011  . PVD (peripheral vascular disease) ([place])    a. s/p R popliteal artery stenosis tx with drug-coated balloon 05/2014, followed by Dr. Fletcher Anon.  . Skin cancer    "cut off back of neck X 2; off left upper arm; right wrist, near right shoulder blade; several burned off other areas of my body" (06/08/2014)  . Sleep apnea with use of continuous positive airway pressure (CPAP)    04-11-11 AHI was 32.9 and titrated to 15 cm H20, DME is [facility]  . Subclinical hypothyroidism     Past Surgical History:  Procedure Laterality Date  . ABDOMINAL ANGIOGRAM N/A 06/08/2014   Procedure: ABDOMINAL ANGIOGRAM;  Surgeon: Wellington Hampshire, MD;  Location: [facility] CATH LAB;  Service: Cardiovascular;  Laterality: N/A;  . APPENDECTOMY  1965  . CARDIAC CATHETERIZATION  12/2010  . CARDIOVERSION  07/2011  . CARDIOVERSION N/A 04/18/2014   Procedure: CARDIOVERSION;  Surgeon: Dorothy Spark, MD;  Location: [facility] ENDOSCOPY;  Service: Cardiovascular;  Laterality: N/A;  . CARDIOVERSION N/A 11/03/2015   Procedure: CARDIOVERSION;  Surgeon: Lelon Perla, MD;  Location: [facility] ENDOSCOPY;  Service: Cardiovascular;  Laterality: N/A;  . CARDIOVERSION N/A 05/08/2017   Procedure: CARDIOVERSION;  Surgeon: Dorothy Spark, MD;  Location: [facility]  ENDOSCOPY;  Service: Cardiovascular;  Laterality: N/A;  . LOWER EXTREMITY ANGIOGRAM N/A 06/08/2014   Procedure: LOWER EXTREMITY ANGIOGRAM;  Surgeon: Wellington Hampshire, MD;  Location: [facility] CATH LAB;  Service: Cardiovascular;  Laterality: N/A;  . POPLITEAL ARTERY ANGIOPLASTY Right 06/08/2014   Archie Endo 06/08/2014  . SKIN CANCER EXCISION Bilateral    "have had them cut off back of neck X 2; off left upper arm; right wrist, near right shoulder blade" (06/08/2014)    Current Outpatient Prescriptions  Medication Sig Dispense Refill  . aspirin EC  81 MG tablet Take 81 mg by mouth daily.    [name] Kitchen atorvastatin (LIPITOR) 80 MG tablet TAKE 1 TABLET AT BEDTIME 90 tablet 3  . BD PEN NEEDLE NANO U/F 32G X 4 MM MISC USE 4 (FOUR) TIMES DAILY. 400 each 1  . Blood Glucose Monitoring Suppl (FREESTYLE LITE) DEVI 1 each by Does not apply route 2 (two) times daily. 1 each 0  . cephALEXin (KEFLEX) 500 MG capsule Take 1 capsule (500 mg total) by mouth 4 (four) times daily. 28 capsule 0  . Choline Fenofibrate (TRILIPIX) 135 MG capsule Take 1 capsule (135 mg total) by mouth daily. 90 capsule 3  . docusate sodium (COLACE) 100 MG capsule Take 100 mg by mouth daily as needed for mild constipation.    . fesoterodine (TOVIAZ) 4 MG TB24 tablet Take 4 mg by mouth daily.    . furosemide (LASIX) 20 MG tablet Take 1 tablet (20 mg total) by mouth daily. 90 tablet 3  . glipiZIDE (GLUCOTROL XL) 10 MG 24 hr tablet TAKE 1 TABLET (10 MG TOTAL) BY MOUTH DAILY WITH BREAKFAST. 30 tablet 5  . glucose blood (FREESTYLE LITE) test strip 1 each by Other route 4 (four) times daily -  before meals and at bedtime. 450 each 3  . Insulin Glargine (BASAGLAR KWIKPEN) 100 UNIT/ML SOPN Inject 0.65 mLs (65 Units total) into the skin at bedtime. 30 mL 0  . insulin lispro (HUMALOG) 100 UNIT/ML KiwkPen Inject 0.05 mLs (5 Units total) into the skin 3 (three) times daily. 15 mL 3  . magnesium oxide (MAG-OX) 400 MG tablet Take 1 tablet (400 mg total) by mouth daily. 30 tablet 11  . metoprolol (LOPRESSOR) 50 MG tablet Take one and one-half tablet by mouth twice a day 90 tablet 6  . MYRBETRIQ 25 MG TB24 tablet TAKE 1 TABLET BY MOUTH EVERY DAY 90 tablet 0  . PRADAXA 150 MG CAPS capsule TAKE 1 CAPSULE EVERY 12    HOURS 180 capsule 3  . amiodarone (PACERONE) 200 MG tablet Take 1 tablet (200 mg total) by mouth 2 (two) times daily. 60 tablet 3   No current facility-administered medications for this visit.     No Known Allergies  Social History   Social History  . Marital status: Married    Spouse  name: [name]  . Number of children: 1  . Years of education: 2   Occupational History  . retired Sales executive   Social History Main Topics  . Smoking status: Former Smoker    Packs/day: 2.00    Years: 14.00    Types: Cigarettes    Quit date: 11/11/1972  . Smokeless tobacco: Never Used  . Alcohol use No     Comment: quit in 1984  . Drug use: No  . Sexual activity: Not Currently   Other Topics Concern  . Not on file   Social History Narrative   Patient is married Engineer, drilling) and lives at  home with his wife.   Patient has one child and his wife has one child.   Patient is retired.   Patient has a high school education.   Patient is right-handed.   Patient drinks very little caffeine.    Family History  Problem Relation Age of Onset  . Diabetes Mother   . Heart attack Mother   . Hypertension Mother   . Heart attack Father   . Heart failure Father   . Hypertension Father   . Diabetes Father   . Diabetes Sister   . Diabetes Brother   . Diabetes Other   . Diabetes Daughter        TYPE ll  . Heart Problems Daughter   . Hypertension Sister   . Hypertension Brother   . Stroke Brother     Review of Systems:  As stated in the HPI and otherwise negative.   BP 130/80   Pulse 97   Ht 5\' 11"  (1.803 m)   Wt 257 lb 3.2 oz (116.7 kg)   SpO2 97%   BMI 35.87 kg/m   Physical Examination:  General: Well developed, well nourished, NAD  HEENT: OP clear, mucus membranes moist  SKIN: warm, dry. No rashes. Neuro: No focal deficits  Musculoskeletal: Muscle strength 5/5 all ext  Psychiatric: Mood and affect normal  Neck: No JVD, no carotid bruits, no thyromegaly, no lymphadenopathy.  Lungs:Clear bilaterally, no wheezes, rhonci, crackles Cardiovascular: Regular rate and rhythm. No murmurs, gallops or rubs. Abdomen:Soft. Bowel sounds present. Non-tender.  Extremities: No lower extremity edema. Pulses are 2 + in the bilateral DP/PT.  Echo August 2018: Left ventricle: The  cavity size was normal. Wall thickness was   normal. Systolic function was normal. The estimated ejection   fraction was in the range of 50% to 55%. The study is not   technically sufficient to allow evaluation of LV diastolic   function. - Aortic valve: Heavily calcified aortic valve with severe   stenosis. Mean gradient (S): 26 mm Hg. Peak gradient (S): 45 mm   Hg. Valve area (VTI): 0.81 cm^2. Valve area (Vmax): 0.92 cm^2.   Valve area (Vmean): 0.81 cm^2. - Mitral valve: Calcified annulus. Mildly thickened leaflets . - Left atrium: Moderately dilated.  Impressions:  - Technically difficult study. LVEF lower at 50-55%. A-fib with RVR   is present. There is likely severe aortic stenosis, however, the   mean aortic valve gradient is lower in this study compared to the   prior study, but calculated AVA is still around 0.9 cm2 based on   an LVOT diameter of 2.3 cm.  ------------------------------------------------------------------- Labs, prior tests, procedures, and surgery: Transthoracic echocardiography (11/19/2016).    The aortic valve showed moderate to severe stenosis.  EF was 60%. Aortic valve: peak gradient of 60 mm Hg and mean gradient of 32 mm Hg.  ------------------------------------------------------------------- Study data:  Comparison was made to the study of 11/19/2016.  Study status:  Routine.  Procedure:  The patient reported no pain pre or post test. Transthoracic echocardiography. Image quality was suboptimal. The study was technically difficult, as a result of poor acoustic windows, poor sound wave transmission, and body habitus. Intravenous contrast (Definity) was administered.  Study completion:  There were no complications.          Transthoracic echocardiography.  M-mode, complete 2D, spectral Doppler, and color Doppler.  Birthdate:  Patient birthdate: 31-Dec-1940.  Age:  Patient is 76 yr old.  Sex:  Gender: male.    BMI: 35.2  kg/m^2.  Blood pressure:      132/82  Patient status:  Outpatient.  Study date: Study date: 06/16/2017. Study time: 01:47 PM.  Location:  [facility]  -------------------------------------------------------------------  ------------------------------------------------------------------- Left ventricle:  The cavity size was normal. Wall thickness was normal. Systolic function was normal. The estimated ejection fraction was in the range of 50% to 55%. The study is not technically sufficient to allow evaluation of LV diastolic function.  ------------------------------------------------------------------- Aortic valve:  Heavily calcified aortic valve with severe stenosis.  Doppler:     VTI ratio of LVOT to aortic valve: 0.19. Valve area (VTI): 0.81 cm^2. Indexed valve area (VTI): 0.33 cm^2/m^2. Peak velocity ratio of LVOT to aortic valve: 0.22. Valve area (Vmax): 0.92 cm^2. Indexed valve area (Vmax): 0.38 cm^2/m^2. Mean velocity ratio of LVOT to aortic valve: 0.2. Valve area (Vmean): 0.81 cm^2. Indexed valve area (Vmean): 0.33 cm^2/m^2.    Mean gradient (S): 26 mm Hg. Peak gradient (S): 45 mm Hg.  ------------------------------------------------------------------- Aorta:  Aortic root: The aortic root was normal in size. Ascending aorta: The ascending aorta was normal in size.  ------------------------------------------------------------------- Mitral valve:   Calcified annulus. Mildly thickened leaflets . Doppler:  There was trivial regurgitation.  ------------------------------------------------------------------- Left atrium:  Moderately dilated.  ------------------------------------------------------------------- Right ventricle:  Poorly visualized.  ------------------------------------------------------------------- Pulmonic valve:    The valve appears to be grossly normal. Doppler:  There was trivial  regurgitation.  ------------------------------------------------------------------- Pulmonary artery:   The main pulmonary artery was normal-sized.  ------------------------------------------------------------------- Right atrium:  The atrium was normal in size.  ------------------------------------------------------------------- Pericardium:  There was no pericardial effusion.  ------------------------------------------------------------------- Systemic veins: Inferior vena cava: The vessel was normal in size. The respirophasic diameter changes were in the normal range (>= 50%), consistent with normal central venous pressure.  ------------------------------------------------------------------- Measurements   Left ventricle                            Value          Reference  LV ID, ED, PLAX chordal           (H)     54.5  mm       43 - 52  LV ID, ES, PLAX chordal           (H)     43    mm       23 - 38  LV fx shortening, PLAX chordal    (L)     21    %        >=29  LV PW thickness, ED                       10.9  mm       ---------  IVS/LV PW ratio, ED                       0.95           <=1.3  Stroke volume, 2D                         49    ml       ---------  Stroke volume/bsa, 2D                     20    ml/m^2   ---------    Ventricular septum  Value          Reference  IVS thickness, ED                         10.4  mm       ---------    LVOT                                      Value          Reference  LVOT ID, S                                23    mm       ---------  LVOT area                                 4.15  cm^2     ---------  LVOT peak velocity, S                     73.6  cm/s     ---------  LVOT mean velocity, S                     45.7  cm/s     ---------  LVOT VTI, S                               12.9  cm       ---------  Stroke volume (SV), LVOT DP               53.6  ml       ---------  Stroke index (SV/bsa), LVOT DP             22    ml/m^2   ---------    Aortic valve                              Value          Reference  Aortic valve peak velocity, S             334   cm/s     ---------  Aortic valve mean velocity, S             234   cm/s     ---------  Aortic valve VTI, S                       66.2  cm       ---------  Aortic mean gradient, S                   26    mm Hg    ---------  Aortic peak gradient, S                   45    mm Hg    ---------  VTI ratio, LVOT/AV                        0.19           ---------  Aortic  valve area, VTI                    0.81  cm^2     ---------  Aortic valve area/bsa, VTI                0.33  cm^2/m^2 ---------  Velocity ratio, peak, LVOT/AV             0.22           ---------  Aortic valve area, peak velocity          0.92  cm^2     ---------  Aortic valve area/bsa, peak               0.38  cm^2/m^2 ---------  velocity  Velocity ratio, mean, LVOT/AV             0.2            ---------  Aortic valve area, mean velocity          0.81  cm^2     ---------  Aortic valve area/bsa, mean               0.33  cm^2/m^2 ---------  velocity    Aorta                                     Value          Reference  Aortic root ID, ED                        37    mm       ---------  Ascending aorta ID, A-P, S                31    mm       ---------    Left atrium                               Value          Reference  LA ID, A-P, ES                            44    mm       ---------  LA ID/bsa, A-P                            1.81  cm/m^2   <=2.2  LA volume, S                              90.6  ml       ---------  LA volume/bsa, S                          37.2  ml/m^2   ---------  LA volume, ES, 1-p A4C                    67.2  ml       ---------  LA volume/bsa, ES, 1-p A4C                27.6  ml/m^2   ---------  LA volume, ES, 1-p A2C                    108   ml       ---------  LA volume/bsa, ES, 1-p A2C                44.4  ml/m^2   ---------    Systemic veins                             Value          Reference  Estimated CVP                             3     mm Hg    ---------  Left ventricle: The cavity size was normal. Wall thickness was   increased in a pattern of mild LVH. Systolic function was normal.   The estimated ejection fraction was in the range of 60% to 65%.   Wall motion was normal; there were no regional wall motion   abnormalities. Doppler parameters are consistent with abnormal   left ventricular relaxation (grade 1 diastolic dysfunction). The   E/e&' ratio is >15, suggesting elevated LV filling pressure. - Aortic valve: Mildly calcified leaflets. Moderate to severe   aortic stenosis. There was trivial regurgitation. Mean gradient   (S): 32 mm Hg. Peak gradient (S): 60 mm Hg. Valve area (VTI): 0.9   cm^2. Valve area (Vmax): 0.96 cm^2. - Mitral valve: Calcified annulus. Mildly thickened leaflets .   There was trivial regurgitation. - Left atrium: Moderately dilated. - Right atrium: The atrium was mildly dilated. - Inferior vena cava: The vessel was normal in size. The   respirophasic diameter changes were in the normal range (= 50%),   consistent with normal central venous pressure. Impressions: - Compared to a prior study in 11/2015, the LVEF is higher at   60-65%. There is now moderate to severe aortic stenosis with an   AVA of around 1.0 cm2. The LVOT measures 2.3 cm (but was   previously measured at 2.0 cm) and this accounts for the   difference in AVA compared to the prior study.  EKG:  EKG is   ordered today. The ekg ordered today demonstrates Atrial fib, rate 98 bpm. Old inferior infarct. Diffuse ST and T wave abn.   Recent Labs: 05/01/2017: ALT 19; BUN 23; Creat 1.69; Potassium 4.3; Sodium 140; TSH 5.76 05/05/2017: Hemoglobin 13.5; Platelets 203   Lipid Panel    Component Value Date/Time   CHOL 156 05/01/2017 1216   TRIG 158 (H) 05/01/2017 1216   HDL 29 (L) 05/01/2017 1216   CHOLHDL 5.4 (H) 05/01/2017 1216   VLDL 32  (H) 05/01/2017 1216   LDLCALC 95 05/01/2017 1216     Wt Readings from Last 3 Encounters:  06/20/17 257 lb 3.2 oz (116.7 kg)  05/05/17 252 lb (114.3 kg)  05/01/17 255 lb 3.2 oz (115.8 kg)     Other studies Reviewed: Additional studies/ records that were reviewed today include: . Review of the above records demonstrates:    Assessment and Plan:   1. CAD without angina: No chest pain suggestive of angina. Will continue ASA, statin and beta blocker.    2. Severe Aortic stenosis: His aortic stenosis appears to be severe. I have personally reviewed his echo images. The aortic valve leaflets are thickened  and do not open well. He is having more dyspnea c/w symptomatic aortic stenosis although he also has recurrent atrial fib and he is always dyspneic when he is in atrial fib. I think he would benefit from AVR but I would like to get his atrial fib under control first. He would likely be a better candidate for TAVR than surgical AVR. I have reviewed the TAVR procedure in detail today with the patient and his family. I will delay further workup for TAVR until we can control his atrial fib. (see below).    3. ATRIAL FIBRILLATION, persistent: He has failed repeat cardioversion. His atrial fib dates back to 2012 and in the past I have been able to restore sinus rhythm with DCCV and he was able to maintain sinus. Now recurrent atrial fib 3 weeks following DCCV. He feels much better in sinus and I think we should try to get him back into sinus, especially with his severe aortic stenosis. I will continue metoprolol and will add amiodarone 200 mg po BID. I will ask Dr. Rayann Heman to see him in 4 weeks and offer advice on medical therapy vs repeat DCCV vs ablation. Will continue Pradaxa.  I will check a chest x-ray today to get a baseline. Baseline recent thyroid labs and LFTs are ok.   4. HTN: BP controlled. No changes.   5. HLD: He is on statin.   6. PAD: Stable. Followed in [facility] clinic.   Current medicines  are reviewed at length with the patient today.  The patient does not have concerns regarding medicines.  The following changes have been made:  no change  Labs/ tests ordered today include:   Orders Placed This Encounter  Procedures  . DG Chest 2 View    Disposition:   FU with me in 3 months  Signed, [name], MD 06/20/2017 1:35 PM    [facility] [address], [place], [place]  [address] Phone: [phone]; Fax: [phone]

## 2017-06-20 NOTE — Addendum Note (Signed)
Addended by: Mendel Ryder on: 06/20/2017 05:01 PM   Modules accepted: Orders

## 2017-06-20 NOTE — Patient Instructions (Addendum)
Medication Instructions:  Your physician has recommended you make the following change in your medication:  Start amiodarone 200 mg by mouth twice daily.   Labwork: none  Testing/Procedures: Have chest X-ray done at [facility]--[address].   Follow-Up: Your physician recommends that you schedule a follow-up appointment in: 3 months with Dr. Angelena Form. Scheduled for 09/26/17 at 2:40  You have been referred to Dr. Rayann Heman. Appointment is September 5,2018 at 2:30     Any Other Special Instructions Will Be Listed Below (If Applicable).     If you need a refill on your cardiac medications before your next appointment, please call your pharmacy.

## 2017-06-23 ENCOUNTER — Other Ambulatory Visit: Payer: Self-pay | Admitting: Physician Assistant

## 2017-06-23 NOTE — Telephone Encounter (Signed)
Refill appropriate 

## 2017-07-04 ENCOUNTER — Other Ambulatory Visit: Payer: Self-pay | Admitting: Physician Assistant

## 2017-07-04 NOTE — Telephone Encounter (Signed)
Medication refill for one time only.  Patient needs to be seen.  

## 2017-07-15 ENCOUNTER — Other Ambulatory Visit: Payer: Self-pay | Admitting: Physician Assistant

## 2017-07-16 ENCOUNTER — Encounter: Payer: Self-pay | Admitting: Internal Medicine

## 2017-07-16 ENCOUNTER — Ambulatory Visit (INDEPENDENT_AMBULATORY_CARE_PROVIDER_SITE_OTHER): Payer: Medicare Other | Admitting: Internal Medicine

## 2017-07-16 VITALS — BP 132/76 | HR 98 | Ht 71.0 in | Wt 252.2 lb

## 2017-07-16 DIAGNOSIS — I481 Persistent atrial fibrillation: Secondary | ICD-10-CM | POA: Diagnosis not present

## 2017-07-16 DIAGNOSIS — I350 Nonrheumatic aortic (valve) stenosis: Secondary | ICD-10-CM | POA: Diagnosis not present

## 2017-07-16 DIAGNOSIS — G4733 Obstructive sleep apnea (adult) (pediatric): Secondary | ICD-10-CM

## 2017-07-16 DIAGNOSIS — I4819 Other persistent atrial fibrillation: Secondary | ICD-10-CM

## 2017-07-16 DIAGNOSIS — I480 Paroxysmal atrial fibrillation: Secondary | ICD-10-CM | POA: Diagnosis not present

## 2017-07-16 NOTE — Progress Notes (Signed)
Electrophysiology Office Note   Date:  07/16/2017   ID:  Steven Maldonado, Steven Maldonado 04-Nov-1941, MRN [id number]  PCP:  [name], [name]  Cardiologist:  Dr Angelena Form Primary Electrophysiologist: Thompson Grayer, MD    CC: afib   History of Present Illness: Steven Maldonado is a 76 y.o. male who presents today for electrophysiology evaluation.   The patient is referred by Dr Angelena Form for EP consultation regarding persistent atrial fibrillation.  He has had afib since 2012.  Though episodes have been infrequent, he has required multiple prior cardioversion.  Recently, he had afib found on routine follow-up by Dr Fletcher Anon.  He underwent cardioversion 05/08/17 but returned to afib within about 10 days.  He reports fatigue and decreased exercise tolerance with his afib. Of note, he is felt to have severe aortic stenosis.  He is being considered for TAVR by Dr Angelena Form.  He has OSA for which he uses CPAP.  He has diabetes, with last A1C of 12.4.    Today, he denies symptoms of chest pain, shortness of breath, orthopnea, PND, lower extremity edema, claudication, dizziness, presyncope, syncope, bleeding, or neurologic sequela. The patient is tolerating medications without difficulties and is otherwise without complaint today.    Past Medical History:  Diagnosis Date  . Aortic stenosis    severe  . Cataract associated with type 2 diabetes mellitus ([place]) 10/06/2013  . CKD (chronic kidney disease), stage III   . Coronary artery disease    a. Cath February 2012 in [facility], occluded RCA with collaterals  . Diabetic ulcer of heel ([place]) 07/14/2014  . DM type 2 (diabetes mellitus, type 2) ([place])   . Essential hypertension   . Hyperkalemia   . Hyperlipidemia   . NSTEMI (non-ST elevated myocardial infarction) ([place]) 12/2010  . Obstructive sleep apnea    2012  . Persistent atrial fibrillation ([place]) 2012   multiple prior cardioversions  . PVD (peripheral vascular disease) ([place])    a. s/p R popliteal artery  stenosis tx with drug-coated balloon 05/2014, followed by Dr. Fletcher Anon.  . Skin cancer    "cut off back of neck X 2; off left upper arm; right wrist, near right shoulder blade; several burned off other areas of my body" (06/08/2014)  . Sleep apnea with use of continuous positive airway pressure (CPAP)    04-11-11 AHI was 32.9 and titrated to 15 cm H20, DME is [facility]  . Subclinical hypothyroidism    Past Surgical History:  Procedure Laterality Date  . ABDOMINAL ANGIOGRAM N/A 06/08/2014   Procedure: ABDOMINAL ANGIOGRAM;  Surgeon: Wellington Hampshire, MD;  Location: [facility] CATH LAB;  Service: Cardiovascular;  Laterality: N/A;  . APPENDECTOMY  1965  . CARDIAC CATHETERIZATION  12/2010  . CARDIOVERSION  07/2011  . CARDIOVERSION N/A 04/18/2014   Procedure: CARDIOVERSION;  Surgeon: Dorothy Spark, MD;  Location: [facility] ENDOSCOPY;  Service: Cardiovascular;  Laterality: N/A;  . CARDIOVERSION N/A 11/03/2015   Procedure: CARDIOVERSION;  Surgeon: Lelon Perla, MD;  Location: [facility] ENDOSCOPY;  Service: Cardiovascular;  Laterality: N/A;  . CARDIOVERSION N/A 05/08/2017   Procedure: CARDIOVERSION;  Surgeon: Dorothy Spark, MD;  Location: [place];  Service: Cardiovascular;  Laterality: N/A;  . LOWER EXTREMITY ANGIOGRAM N/A 06/08/2014   Procedure: LOWER EXTREMITY ANGIOGRAM;  Surgeon: Wellington Hampshire, MD;  Location: [facility] CATH LAB;  Service: Cardiovascular;  Laterality: N/A;  . POPLITEAL ARTERY ANGIOPLASTY Right 06/08/2014   Archie Endo 06/08/2014  . SKIN CANCER EXCISION Bilateral    "have had  them cut off back of neck X 2; off left upper arm; right wrist, near right shoulder blade" (06/08/2014)     Current Outpatient Prescriptions  Medication Sig Dispense Refill  . amiodarone (PACERONE) 200 MG tablet Take 1 tablet (200 mg total) by mouth 2 (two) times daily. 60 tablet 3  . aspirin EC 81 MG tablet Take 81 mg by mouth daily.    [name] Kitchen atorvastatin (LIPITOR) 80 MG tablet TAKE 1 TABLET EVERY DAY AT BEDTIME 90 tablet 0  . BD PEN  NEEDLE NANO U/F 32G X 4 MM MISC USE 4 (FOUR) TIMES DAILY. 400 each 1  . Blood Glucose Monitoring Suppl (FREESTYLE LITE) DEVI 1 each by Does not apply route 2 (two) times daily. 1 each 0  . cephALEXin (KEFLEX) 500 MG capsule Take 1 capsule (500 mg total) by mouth 4 (four) times daily. 28 capsule 0  . Choline Fenofibrate 135 MG capsule TAKE 1 CAPSULE DAILY 90 capsule 3  . docusate sodium (COLACE) 100 MG capsule Take 100 mg by mouth daily as needed for mild constipation.    . fesoterodine (TOVIAZ) 4 MG TB24 tablet Take 4 mg by mouth daily.    . furosemide (LASIX) 20 MG tablet Take 1 tablet (20 mg total) by mouth daily. 90 tablet 3  . glipiZIDE (GLUCOTROL XL) 10 MG 24 hr tablet TAKE 1 TABLET (10 MG TOTAL) BY MOUTH DAILY WITH BREAKFAST. 30 tablet 5  . glucose blood (FREESTYLE LITE) test strip 1 each by Other route 4 (four) times daily -  before meals and at bedtime. 450 each 3  . Insulin Glargine (BASAGLAR KWIKPEN) 100 UNIT/ML SOPN Inject 0.65 mLs (65 Units total) into the skin at bedtime. 30 mL 0  . insulin lispro (HUMALOG) 100 UNIT/ML KiwkPen Inject 0.05 mLs (5 Units total) into the skin 3 (three) times daily. 15 mL 3  . magnesium oxide (MAG-OX) 400 MG tablet Take 1 tablet (400 mg total) by mouth daily. 30 tablet 11  . metoprolol (LOPRESSOR) 50 MG tablet Take one and one-half tablet by mouth twice a day 90 tablet 6  . MYRBETRIQ 25 MG TB24 tablet TAKE 1 TABLET BY MOUTH EVERY DAY 90 tablet 0  . PRADAXA 150 MG CAPS capsule TAKE 1 CAPSULE EVERY 12    HOURS 180 capsule 3   No current facility-administered medications for this visit.     Allergies:   Patient has no known allergies.   Social History:  The patient  reports that he quit smoking about 44 years ago. His smoking use included Cigarettes. He has a 28.00 pack-year smoking history. He has never used smokeless tobacco. He reports that he does not drink alcohol or use drugs.   Family History:  The patient's  family history includes Diabetes in his  brother, daughter, father, mother, other, and sister; Heart Problems in his daughter; Heart attack in his father and mother; Heart failure in his father; Hypertension in his brother, father, mother, and sister; Stroke in his brother.    ROS:  Please see the history of present illness.   All other systems are personally reviewed and negative.    PHYSICAL EXAM: VS:  BP 132/76   Pulse 98   Ht 5\' 11"  (1.803 m)   Wt 252 lb 3.2 oz (114.4 kg)   BMI 35.17 kg/m  , BMI Body mass index is 35.17 kg/m. GEN: overweight, in no acute distress  HEENT: normal  Neck: no JVD, carotid bruits, or masses Cardiac: iRRR; 2/6 SEM LUSB  Respiratory:  clear to auscultation bilaterally, normal work of breathing GI: soft, nontender, nondistended, + BS MS: no deformity or atrophy  Skin: warm and dry  Neuro:  Strength and sensation are intact Psych: euthymic mood, full affect  EKG:  EKG is ordered today. The ekg ordered today is personally reviewed and shows afib with V rate 96 bpm, RBBB   Recent Labs: 05/01/2017: ALT 19; BUN 23; Creat 1.69; Potassium 4.3; Sodium 140; TSH 5.76 05/05/2017: Hemoglobin 13.5; Platelets 203  personally reviewed   Lipid Panel     Component Value Date/Time   CHOL 156 05/01/2017 1216   TRIG 158 (H) 05/01/2017 1216   HDL 29 (L) 05/01/2017 1216   CHOLHDL 5.4 (H) 05/01/2017 1216   VLDL 32 (H) 05/01/2017 1216   LDLCALC 95 05/01/2017 1216   personally reviewed   Wt Readings from Last 3 Encounters:  07/16/17 252 lb 3.2 oz (114.4 kg)  06/20/17 257 lb 3.2 oz (116.7 kg)  05/05/17 252 lb (114.3 kg)      Other studies personally reviewed: Additional studies/ records that were reviewed today include: prior echo, Dr Tyrell Antonio notes, Dr Camillia Herter notes  Review of the above records today demonstrates: as above   ASSESSMENT AND PLAN:  1.  Persistent afib The patient has symptomatic persistent afib in the setting of severe AS, obesity, OSA (uses CPAP), and poorly controlled  diabetes.  I worry that his ability to maintain sinus rhythm long term is quite low.  He has been started on amiodarone recently but has not previously tried AAD therapy.   I think that he would do best long term with surgical MAZE and AVR.  He is very clear however that he will not entertain the thought of this procedure.  Apparently his father had a poor outcome from prior surgery. I do not feel that anticipated success rates with ablation are adequate in the setting of severe aortic valvular disease.  I also think that risks of the procedure are increased.  I would therefore advise that we continue medical therapy with amiodarone and repeat cardioversion.  If he remains in sinus rhythm with amiodarone then he may proceed with further aortic valve procedures. He is on pradaxa. Importance of compliance with this medicines was discussed with the patient.  Continue rate control in the interim. As he has been loaded for 4 weeks with amiodarone 200mg  BID, I will reduce amiodarone to 200mg  daily at this time.  2. Obesity Body mass index is 35.17 kg/m. Weight loss is advised  3. OSA Compliance with cpap is encouraged  4. Severe AS As above  Follow-up:  In AF clinic 2 weeks after cardioversion Follow-up with Dr Angelena Form as scheduled  Current medicines are reviewed at length with the patient today.   The patient does not have concerns regarding his medicines.  The following changes were made today:  none    Signed, Thompson Grayer, MD  07/16/2017 3:01 PM     [place] [address] [phone] (office) [phone] (fax)

## 2017-07-16 NOTE — Patient Instructions (Addendum)
Medication Instructions:  Your physician has recommended you make the following change in your medication:  1) Decrease Amiodarone to 200 mg daily   Labwork: Your physician recommends that you return for lab work today: BMP/CBC   Testing/Procedures: Your physician has recommended that you have a Cardioversion (DCCV). Electrical Cardioversion uses a jolt of electricity to your heart either through paddles or wired patches attached to your chest. This is a controlled, usually prescheduled, procedure. Defibrillation is done under light anesthesia in the hospital, and you usually go home the day of the procedure. This is done to get your heart back into a normal rhythm. You are not awake for the procedure. Please see the instruction sheet given to you today.---07/28/17  [facility] at 9:30am Do not eat or drink after midnight the night prior to the procedure Okay to take your medications the morning of the procedure with a small sip of water--HOLD furosemide and Glipizide Will need someone to drive you home at discharge      Follow-Up:  Your physician recommends that you schedule a follow-up appointment in: 2 weeks with Steven Palau, NP   Any Other Special Instructions Will Be Listed Below (If Applicable).     If you need a refill on your cardiac medications before your next appointment, please call your pharmacy.

## 2017-07-17 LAB — CBC WITH DIFFERENTIAL/PLATELET
Basophils Absolute: 0.1 10*3/uL (ref 0.0–0.2)
Basos: 2 %
EOS (ABSOLUTE): 0.2 10*3/uL (ref 0.0–0.4)
Eos: 3 %
Hematocrit: 42.9 % (ref 37.5–51.0)
Hemoglobin: 14.1 g/dL (ref 13.0–17.7)
Immature Grans (Abs): 0 10*3/uL (ref 0.0–0.1)
Immature Granulocytes: 1 %
Lymphocytes Absolute: 2 10*3/uL (ref 0.7–3.1)
Lymphs: 33 %
MCH: 30.3 pg (ref 26.6–33.0)
MCHC: 32.9 g/dL (ref 31.5–35.7)
MCV: 92 fL (ref 79–97)
Monocytes Absolute: 0.5 10*3/uL (ref 0.1–0.9)
Monocytes: 9 %
Neutrophils Absolute: 3.3 10*3/uL (ref 1.4–7.0)
Neutrophils: 52 %
Platelets: 181 10*3/uL (ref 150–379)
RBC: 4.65 x10E6/uL (ref 4.14–5.80)
RDW: 14.2 % (ref 12.3–15.4)
WBC: 6.1 10*3/uL (ref 3.4–10.8)

## 2017-07-17 LAB — BASIC METABOLIC PANEL
BUN/Creatinine Ratio: 15 (ref 10–24)
BUN: 20 mg/dL (ref 8–27)
CO2: 21 mmol/L (ref 20–29)
Calcium: 10.4 mg/dL — ABNORMAL HIGH (ref 8.6–10.2)
Chloride: 98 mmol/L (ref 96–106)
Creatinine, Ser: 1.33 mg/dL — ABNORMAL HIGH (ref 0.76–1.27)
GFR calc Af Amer: 61 mL/min/{1.73_m2} (ref >59)
GFR calc non Af Amer: 53 mL/min/{1.73_m2} — ABNORMAL LOW (ref >59)
Glucose: 293 mg/dL — ABNORMAL HIGH (ref 65–99)
Potassium: 4.8 mmol/L (ref 3.5–5.2)
Sodium: 135 mmol/L (ref 134–144)

## 2017-07-25 ENCOUNTER — Other Ambulatory Visit: Payer: Self-pay | Admitting: Nurse Practitioner

## 2017-07-28 ENCOUNTER — Ambulatory Visit (HOSPITAL_COMMUNITY): Payer: Medicare Other | Admitting: Anesthesiology

## 2017-07-28 ENCOUNTER — Ambulatory Visit (HOSPITAL_COMMUNITY)
Admission: RE | Admit: 2017-07-28 | Discharge: 2017-07-28 | Disposition: A | Discharge status: Home / Self Care | Payer: Medicare Other | Source: Ambulatory Visit | Attending: Cardiology | Admitting: Cardiology

## 2017-07-28 ENCOUNTER — Encounter (HOSPITAL_COMMUNITY): Payer: Self-pay | Admitting: Anesthesiology

## 2017-07-28 ENCOUNTER — Encounter (HOSPITAL_COMMUNITY)
Admission: RE | Disposition: A | Discharge status: Home / Self Care | Payer: Self-pay | Source: Ambulatory Visit | Attending: Cardiology

## 2017-07-28 DIAGNOSIS — I10 Essential (primary) hypertension: Secondary | ICD-10-CM | POA: Diagnosis not present

## 2017-07-28 DIAGNOSIS — Z7982 Long term (current) use of aspirin: Secondary | ICD-10-CM | POA: Diagnosis not present

## 2017-07-28 DIAGNOSIS — I739 Peripheral vascular disease, unspecified: Secondary | ICD-10-CM | POA: Diagnosis not present

## 2017-07-28 DIAGNOSIS — I4891 Unspecified atrial fibrillation: Secondary | ICD-10-CM | POA: Insufficient documentation

## 2017-07-28 DIAGNOSIS — I2510 Atherosclerotic heart disease of native coronary artery without angina pectoris: Secondary | ICD-10-CM | POA: Diagnosis not present

## 2017-07-28 DIAGNOSIS — Z79899 Other long term (current) drug therapy: Secondary | ICD-10-CM | POA: Insufficient documentation

## 2017-07-28 HISTORY — PX: CARDIOVERSION: SHX1299

## 2017-07-28 LAB — POCT I-STAT 4, (NA,K, GLUC, HGB,HCT)
Glucose, Bld: 282 mg/dL — ABNORMAL HIGH (ref 65–99)
HCT: 40 % (ref 39.0–52.0)
Hemoglobin: 13.6 g/dL (ref 13.0–17.0)
Potassium: 4.2 mmol/L (ref 3.5–5.1)
Sodium: 139 mmol/L (ref 135–145)

## 2017-07-28 SURGERY — CARDIOVERSION
Anesthesia: General

## 2017-07-28 MED ORDER — SODIUM CHLORIDE 0.45 % IV SOLN: INTRAVENOUS | Status: DC

## 2017-07-28 MED ORDER — SODIUM CHLORIDE 0.9 % IV SOLN
INTRAVENOUS | Status: DC
  Administered 2017-07-28: 10:00:00 via INTRAVENOUS

## 2017-07-28 MED ORDER — DABIGATRAN ETEXILATE MESYLATE 150 MG PO CAPS
150.0000 mg | ORAL_CAPSULE | Freq: Once | ORAL | Status: AC
  Administered 2017-07-28: 150 mg via ORAL
  Filled 2017-07-28: qty 1

## 2017-07-28 MED ORDER — PROPOFOL 10 MG/ML IV BOLUS
INTRAVENOUS | Status: DC | PRN
  Administered 2017-07-28: 100 mg via INTRAVENOUS

## 2017-07-28 MED ORDER — LIDOCAINE HCL (CARDIAC) 20 MG/ML IV SOLN
INTRAVENOUS | Status: DC | PRN
  Administered 2017-07-28: 60 mg via INTRAVENOUS

## 2017-07-28 NOTE — CV Procedure (Signed)
    Cardioversion Note  Steven Maldonado [id number] 1940/11/27  Procedure: DC Cardioversion Indications: atrial fibrillation   Procedure Details Consent: Obtained Time Out: Verified patient identification, verified procedure, site/side was marked, verified correct patient position, special equipment/implants available, Radiology Safety Procedures followed,  medications/allergies/relevent history reviewed, required imaging and test results available.  Performed  The patient has been on adequate anticoagulation.  The patient received IV propofol 70 mg administered by anesthesia staff for sedation.  Synchronous cardioversion was performed at 120 joules.  The cardioversion was successful.   Complications: No apparent complications Patient did tolerate procedure well.   Ena Dawley, MD, [facility] 07/28/2017, 11:24 AM

## 2017-07-28 NOTE — Interval H&P Note (Signed)
History and Physical Interval Note:  07/28/2017 9:34 AM  Steven Maldonado  has presented today for surgery, with the diagnosis of AFIB  The various methods of treatment have been discussed with the patient and family. After consideration of risks, benefits and other options for treatment, the patient has consented to  Procedure(s): CARDIOVERSION (N/A) as a surgical intervention .  The patient's history has been reviewed, patient examined, no change in status, stable for surgery.  I have reviewed the patient's chart and labs.  Questions were answered to the patient's satisfaction.     Ena Dawley

## 2017-07-28 NOTE — Anesthesia Preprocedure Evaluation (Addendum)
Anesthesia Evaluation  Patient identified by MRN, date of birth, ID band Patient awake    Reviewed: Allergy & Precautions, NPO status , Patient's Chart, lab work & pertinent test results, reviewed documented beta blocker date and time   Airway Mallampati: II  TM Distance: >3 FB Neck ROM: Full    Dental  (+) Lower Dentures, Upper Dentures   Pulmonary sleep apnea and Continuous Positive Airway Pressure Ventilation , former smoker,    breath sounds clear to auscultation       Cardiovascular hypertension, Pt. on medications and Pt. on home beta blockers + CAD, + Past MI and + Peripheral Vascular Disease  + dysrhythmias Atrial Fibrillation + Valvular Problems/Murmurs AS  Rhythm:Irregular  A-fib, RBBB  Technically difficult study. LVEF lower at 50-55%. A-fib with RVR is present. There is likely severe aortic stenosis, however, the mean aortic valve gradient is lower in this study compared to the prior study, but calculated AVA is still around 0.9 cm2 based on an LVOT diameter of 2.3 cm.    Neuro/Psych negative neurological ROS  negative psych ROS   GI/Hepatic negative GI ROS, Neg liver ROS,   Endo/Other  diabetes, Type 2, Oral Hypoglycemic Agents, Insulin DependentHypothyroidism   Renal/GU CRFRenal disease     Musculoskeletal negative musculoskeletal ROS (+)   Abdominal (+) + obese,   Peds  Hematology negative hematology ROS (+)   Anesthesia Other Findings HLD  Reproductive/Obstetrics                             Lab Results  Component Value Date   WBC 6.1 07/16/2017   HGB 14.1 07/16/2017   HCT 42.9 07/16/2017   MCV 92 07/16/2017   PLT 181 07/16/2017   Lab Results  Component Value Date   INR 1.02 06/08/2014   INR 1.36 08/02/2011    Echo - Left ventricle: The cavity size was normal. Wall thickness was   increased in a pattern of mild LVH. Systolic function was normal.   The estimated  ejection fraction was in the range of 60% to 65%.   Wall motion was normal; there were no regional wall motion   abnormalities. Doppler parameters are consistent with abnormal   left ventricular relaxation (grade 1 diastolic dysfunction). The   E/e&' ratio is >15, suggesting elevated LV filling pressure. - Aortic valve: Mildly calcified leaflets. Moderate to severe   aortic stenosis. There was trivial regurgitation. Mean gradient   (S): 32 mm Hg. Peak gradient (S): 60 mm Hg. Valve area (VTI): 0.9   cm^2. Valve area (Vmax): 0.96 cm^2. - Mitral valve: Calcified annulus. Mildly thickened leaflets .   There was trivial regurgitation. - Left atrium: Moderately dilated. - Right atrium: The atrium was mildly dilated. - Inferior vena cava: The vessel was normal in size. The   respirophasic diameter changes were in the normal range (= 50%),   consistent with normal central venous pressure.  Anesthesia Physical  Anesthesia Plan  ASA: IV  Anesthesia Plan: General   Post-op Pain Management:    Induction: Intravenous  PONV Risk Score and Plan: 2 and Propofol infusion, Propofol and Treatment may vary due to age or medical condition  Airway Management Planned: Mask  Additional Equipment:   Intra-op Plan:   Post-operative Plan:   Informed Consent: I have reviewed the patients History and Physical, chart, labs and discussed the procedure including the risks, benefits and alternatives for the proposed anesthesia with the patient  or authorized representative who has indicated his/her understanding and acceptance.     Plan Discussed with: CRNA  Anesthesia Plan Comments:         Anesthesia Quick Evaluation

## 2017-07-28 NOTE — Transfer of Care (Signed)
Immediate Anesthesia Transfer of Care Note  Patient: Steven Maldonado  Procedure(s) Performed: Procedure(s): CARDIOVERSION (N/A)  Patient Location: Endoscopy Unit  Anesthesia Type:General  Level of Consciousness: oriented, drowsy and patient cooperative  Airway & Oxygen Therapy: Patient Spontanous Breathing and Patient connected to nasal cannula oxygen  Post-op Assessment: Report given to RN and Post -op Vital signs reviewed and stable  Post vital signs: Reviewed  Last Vitals:  Vitals:   07/28/17 0935  BP: (!) 130/98  Pulse: 98  Resp: 15  SpO2: 98%    Last Pain:  Vitals:   07/28/17 0935  TempSrc: Oral         Complications: No apparent anesthesia complications

## 2017-07-28 NOTE — Anesthesia Procedure Notes (Signed)
Procedure Name: General with mask airway Date/Time: 07/28/2017 11:12 AM Performed by: Jenne Campus Pre-anesthesia Checklist: Patient identified, Emergency Drugs available, Suction available, Patient being monitored and Timeout performed Patient Re-evaluated:Patient Re-evaluated prior to induction Oxygen Delivery Method: Ambu bag

## 2017-07-28 NOTE — Discharge Instructions (Signed)
Electrical Cardioversion, Care After °This sheet gives you information about how to care for yourself after your procedure. Your health care provider may also give you more specific instructions. If you have problems or questions, contact your health care provider. °What can I expect after the procedure? °After the procedure, it is common to have: °· Some redness on the skin where the shocks were given. ° °Follow these instructions at home: °· Do not drive for 24 hours if you were given a medicine to help you relax (sedative). °· Take over-the-counter and prescription medicines only as told by your health care provider. °· Ask your health care provider how to check your pulse. Check it often. °· Rest for 48 hours after the procedure or as told by your health care provider. °· Avoid or limit your caffeine use as told by your health care provider. °Contact a health care provider if: °· You feel like your heart is beating too quickly or your pulse is not regular. °· You have a serious muscle cramp that does not go away. °Get help right away if: °· You have discomfort in your chest. °· You are dizzy or you feel faint. °· You have trouble breathing or you are short of breath. °· Your speech is slurred. °· You have trouble moving an arm or leg on one side of your body. °· Your fingers or toes turn cold or blue. °This information is not intended to replace advice given to you by your health care provider. Make sure you discuss any questions you have with your health care provider. °Document Released: 08/18/2013 Document Revised: 05/31/2016 Document Reviewed: 05/03/2016 °Elsevier Interactive Patient Education © 2018 [employer] ° °

## 2017-07-29 NOTE — Anesthesia Postprocedure Evaluation (Signed)
Anesthesia Post Note  Patient: Steven Maldonado  Procedure(s) Performed: Procedure(s) (LRB): CARDIOVERSION (N/A)     Patient location during evaluation: PACU Anesthesia Type: General Level of consciousness: awake and alert Pain management: pain level controlled Vital Signs Assessment: post-procedure vital signs reviewed and stable Respiratory status: spontaneous breathing, nonlabored ventilation, respiratory function stable and patient connected to nasal cannula oxygen Cardiovascular status: blood pressure returned to baseline and stable Postop Assessment: no apparent nausea or vomiting Anesthetic complications: no    Last Vitals:  Vitals:   07/28/17 1130 07/28/17 1140  BP: 101/63   Pulse: 71 73  Resp: 17 16  Temp:    SpO2: 100% 98%    Last Pain:  Vitals:   07/28/17 1125  TempSrc: Oral                 [name] P Avneet Ashmore

## 2017-07-30 DIAGNOSIS — Z23 Encounter for immunization: Secondary | ICD-10-CM | POA: Diagnosis not present

## 2017-07-31 ENCOUNTER — Ambulatory Visit: Payer: Medicare Other | Admitting: Physician Assistant

## 2017-07-31 DIAGNOSIS — H6123 Impacted cerumen, bilateral: Secondary | ICD-10-CM | POA: Diagnosis not present

## 2017-08-08 ENCOUNTER — Ambulatory Visit: Payer: Medicare Other | Admitting: Cardiovascular Disease

## 2017-08-12 ENCOUNTER — Ambulatory Visit (HOSPITAL_COMMUNITY): Payer: Medicare Other | Admitting: Nurse Practitioner

## 2017-08-18 ENCOUNTER — Encounter (HOSPITAL_COMMUNITY): Payer: Self-pay | Admitting: Nurse Practitioner

## 2017-08-18 ENCOUNTER — Ambulatory Visit (HOSPITAL_COMMUNITY)
Admission: RE | Admit: 2017-08-18 | Discharge: 2017-08-18 | Disposition: A | Discharge status: Home / Self Care | Payer: Medicare Other | Source: Ambulatory Visit | Attending: Nurse Practitioner | Admitting: Nurse Practitioner

## 2017-08-18 VITALS — BP 130/72 | HR 72 | Ht 71.0 in | Wt 252.4 lb

## 2017-08-18 DIAGNOSIS — E875 Hyperkalemia: Secondary | ICD-10-CM | POA: Insufficient documentation

## 2017-08-18 DIAGNOSIS — I4891 Unspecified atrial fibrillation: Secondary | ICD-10-CM | POA: Diagnosis present

## 2017-08-18 DIAGNOSIS — Z7901 Long term (current) use of anticoagulants: Secondary | ICD-10-CM | POA: Diagnosis not present

## 2017-08-18 DIAGNOSIS — E1151 Type 2 diabetes mellitus with diabetic peripheral angiopathy without gangrene: Secondary | ICD-10-CM | POA: Diagnosis not present

## 2017-08-18 DIAGNOSIS — I2510 Atherosclerotic heart disease of native coronary artery without angina pectoris: Secondary | ICD-10-CM | POA: Diagnosis not present

## 2017-08-18 DIAGNOSIS — Z79899 Other long term (current) drug therapy: Secondary | ICD-10-CM | POA: Insufficient documentation

## 2017-08-18 DIAGNOSIS — Z794 Long term (current) use of insulin: Secondary | ICD-10-CM | POA: Insufficient documentation

## 2017-08-18 DIAGNOSIS — I252 Old myocardial infarction: Secondary | ICD-10-CM | POA: Insufficient documentation

## 2017-08-18 DIAGNOSIS — Z7982 Long term (current) use of aspirin: Secondary | ICD-10-CM | POA: Diagnosis not present

## 2017-08-18 DIAGNOSIS — I481 Persistent atrial fibrillation: Secondary | ICD-10-CM | POA: Insufficient documentation

## 2017-08-18 DIAGNOSIS — E785 Hyperlipidemia, unspecified: Secondary | ICD-10-CM | POA: Insufficient documentation

## 2017-08-18 DIAGNOSIS — Z87891 Personal history of nicotine dependence: Secondary | ICD-10-CM | POA: Insufficient documentation

## 2017-08-18 DIAGNOSIS — E1122 Type 2 diabetes mellitus with diabetic chronic kidney disease: Secondary | ICD-10-CM | POA: Insufficient documentation

## 2017-08-18 DIAGNOSIS — I4819 Other persistent atrial fibrillation: Secondary | ICD-10-CM

## 2017-08-18 DIAGNOSIS — E11621 Type 2 diabetes mellitus with foot ulcer: Secondary | ICD-10-CM | POA: Insufficient documentation

## 2017-08-18 DIAGNOSIS — G4733 Obstructive sleep apnea (adult) (pediatric): Secondary | ICD-10-CM | POA: Insufficient documentation

## 2017-08-18 DIAGNOSIS — I129 Hypertensive chronic kidney disease with stage 1 through stage 4 chronic kidney disease, or unspecified chronic kidney disease: Secondary | ICD-10-CM | POA: Insufficient documentation

## 2017-08-18 DIAGNOSIS — N183 Chronic kidney disease, stage 3 (moderate): Secondary | ICD-10-CM | POA: Insufficient documentation

## 2017-08-18 DIAGNOSIS — E039 Hypothyroidism, unspecified: Secondary | ICD-10-CM | POA: Insufficient documentation

## 2017-08-18 NOTE — Progress Notes (Signed)
Primary Care Physician: Rennis Golden Referring Physician: Dr. Rayann Maldonado Cardiologist: Steven Maldonado is a 76 y.o. male with a h/o persistent afib. He was recently see by Steven Maldonado for consideration of options to restore SR.He has required multiple cardioversion's. Last cardioversion was 05/08/17 but returned to SR in10 days. He has severe AS  and being considered for TAVR. He uses CPAP for OSA.Steven Maldonado did not feel that he would be a good ablation candidate. He raise the possibility for valve repair and surgical maze, but pt declined. He had previously been loading on amiodarone 200 mg bid x 4 weeks. His dose was decreased to 200 mg a day and he was set up for cardioversion, which was successful.  He is in the afib clinic for f/u, 10/8. He is holding SR. He feels well. He continues to be compliant with Pradaxa for a chadsvasc score of at least 4.    Today, he denies symptoms of palpitations, chest pain, shortness of breath, orthopnea, PND, lower extremity edema, dizziness, presyncope, syncope, or neurologic sequela. The patient is tolerating medications without difficulties and is otherwise without complaint today.   Past Medical History:  Diagnosis Date  . Aortic stenosis    severe  . Cataract associated with type 2 diabetes mellitus ([place]) 10/06/2013  . CKD (chronic kidney disease), stage III ([place])   . Coronary artery disease    a. Cath February 2012 in [facility], occluded RCA with collaterals  . Diabetic ulcer of heel ([place]) 07/14/2014  . DM type 2 (diabetes mellitus, type 2) ([place])   . Essential hypertension   . Hyperkalemia   . Hyperlipidemia   . NSTEMI (non-ST elevated myocardial infarction) ([place]) 12/2010  . Obstructive sleep apnea    2012  . Persistent atrial fibrillation ([place]) 2012   multiple prior cardioversions  . PVD (peripheral vascular disease) ([place])    a. s/p R popliteal artery stenosis tx with drug-coated balloon 05/2014, followed by Dr. Fletcher Anon.  . Skin  cancer    "cut off back of neck X 2; off left upper arm; right wrist, near right shoulder blade; several burned off other areas of my body" (06/08/2014)  . Sleep apnea with use of continuous positive airway pressure (CPAP)    04-11-11 AHI was 32.9 and titrated to 15 cm H20, DME is [facility]  . Subclinical hypothyroidism    Past Surgical History:  Procedure Laterality Date  . ABDOMINAL ANGIOGRAM N/A 06/08/2014   Procedure: ABDOMINAL ANGIOGRAM;  Surgeon: Wellington Hampshire, MD;  Location: [facility] CATH LAB;  Service: Cardiovascular;  Laterality: N/A;  . APPENDECTOMY  1965  . CARDIAC CATHETERIZATION  12/2010  . CARDIOVERSION  07/2011  . CARDIOVERSION N/A 04/18/2014   Procedure: CARDIOVERSION;  Surgeon: Dorothy Spark, MD;  Location: [facility];  Service: Cardiovascular;  Laterality: N/A;  . CARDIOVERSION N/A 11/03/2015   Procedure: CARDIOVERSION;  Surgeon: Lelon Perla, MD;  Location: [facility] ENDOSCOPY;  Service: Cardiovascular;  Laterality: N/A;  . CARDIOVERSION N/A 05/08/2017   Procedure: CARDIOVERSION;  Surgeon: Dorothy Spark, MD;  Location: [facility] ENDOSCOPY;  Service: Cardiovascular;  Laterality: N/A;  . CARDIOVERSION N/A 07/28/2017   Procedure: CARDIOVERSION;  Surgeon: Dorothy Spark, MD;  Location: [place];  Service: Cardiovascular;  Laterality: N/A;  . LOWER EXTREMITY ANGIOGRAM N/A 06/08/2014   Procedure: LOWER EXTREMITY ANGIOGRAM;  Surgeon: Wellington Hampshire, MD;  Location: [facility] CATH LAB;  Service: Cardiovascular;  Laterality: N/A;  . POPLITEAL ARTERY ANGIOPLASTY Right 06/08/2014   /  notes 06/08/2014  . SKIN CANCER EXCISION Bilateral    "have had them cut off back of neck X 2; off left upper arm; right wrist, near right shoulder blade" (06/08/2014)    Current Outpatient Prescriptions  Medication Sig Dispense Refill  . amiodarone (PACERONE) 200 MG tablet Take 200 mg by mouth daily.    [name] Kitchen aspirin EC 81 MG tablet Take 81 mg by mouth daily.    [name] Kitchen atorvastatin (LIPITOR) 80 MG tablet TAKE 1 TABLET EVERY  DAY AT BEDTIME 90 tablet 0  . BD PEN NEEDLE NANO U/F 32G X 4 MM MISC USE 4 (FOUR) TIMES DAILY. 400 each 1  . Blood Glucose Monitoring Suppl (FREESTYLE LITE) DEVI 1 each by Does not apply route 2 (two) times daily. 1 each 0  . Choline Fenofibrate 135 MG capsule TAKE 1 CAPSULE DAILY 90 capsule 3  . docusate sodium (COLACE) 100 MG capsule Take 100 mg by mouth daily as needed for mild constipation.    . fesoterodine (TOVIAZ) 4 MG TB24 tablet Take 4 mg by mouth daily.    . furosemide (LASIX) 20 MG tablet Take 1 tablet (20 mg total) by mouth daily. 90 tablet 3  . glipiZIDE (GLUCOTROL XL) 10 MG 24 hr tablet TAKE 1 TABLET (10 MG TOTAL) BY MOUTH DAILY WITH BREAKFAST. 30 tablet 5  . glucose blood (FREESTYLE LITE) test strip 1 each by Other route 4 (four) times daily -  before meals and at bedtime. 450 each 3  . Insulin Glargine (BASAGLAR KWIKPEN) 100 UNIT/ML SOPN Inject 0.65 mLs (65 Units total) into the skin at bedtime. 30 mL 0  . insulin lispro (HUMALOG) 100 UNIT/ML KiwkPen Inject 0.05 mLs (5 Units total) into the skin 3 (three) times daily. 15 mL 3  . magnesium oxide (MAG-OX) 400 MG tablet Take 1 tablet (400 mg total) by mouth daily. 30 tablet 11  . metoprolol (LOPRESSOR) 50 MG tablet Take one and one-half tablet by mouth twice a day 90 tablet 6  . MYRBETRIQ 25 MG TB24 tablet TAKE 1 TABLET BY MOUTH EVERY DAY 90 tablet 0  . PRADAXA 150 MG CAPS capsule TAKE 1 CAPSULE EVERY 12    HOURS 180 capsule 3   No current facility-administered medications for this encounter.     No Known Allergies  Social History   Social History  . Marital status: Married    Spouse name: [name]  . Number of children: 1  . Years of education: 54   Occupational History  . retired Sales executive   Social History Main Topics  . Smoking status: Former Smoker    Packs/day: 2.00    Years: 14.00    Types: Cigarettes    Quit date: 11/11/1972  . Smokeless tobacco: Never Used  . Alcohol use No     Comment: quit in 1984    . Drug use: No  . Sexual activity: Not Currently   Other Topics Concern  . 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   Patient has one child and his wife has one child.   Patient is retired.   Patient has a high school education.   Patient is right-handed.   Patient drinks very little caffeine.    Family History  Problem Relation Age of Onset  . Diabetes Mother   . Heart attack Mother   . Hypertension Mother   . Heart attack Father   . Heart failure  Father   . Hypertension Father   . Diabetes Father   . Diabetes Sister   . Diabetes Brother   . Diabetes Other   . Diabetes Daughter        TYPE ll  . Heart Problems Daughter   . Hypertension Sister   . Hypertension Brother   . Stroke Brother     ROS- All systems are reviewed and negative except as per the HPI above  Physical Exam: Vitals:   08/18/17 1347  BP: 130/72  Pulse: 72  Weight: 252 lb 6.4 oz (114.5 kg)  Height: 5\' 11"  (1.803 m)   Wt Readings from Last 3 Encounters:  08/18/17 252 lb 6.4 oz (114.5 kg)  07/16/17 252 lb 3.2 oz (114.4 kg)  06/20/17 257 lb 3.2 oz (116.7 kg)    Labs: Lab Results  Component Value Date   NA 139 07/28/2017   K 4.2 07/28/2017   CL 98 07/16/2017   CO2 21 07/16/2017   GLUCOSE 282 (H) 07/28/2017   BUN 20 07/16/2017   CREATININE 1.33 (H) 07/16/2017   CALCIUM 10.4 (H) 07/16/2017   MG 2.1 11/08/2015   Lab Results  Component Value Date   INR 1.02 06/08/2014   Lab Results  Component Value Date   CHOL 156 05/01/2017   HDL 29 (L) 05/01/2017   LDLCALC 95 05/01/2017   TRIG 158 (H) 05/01/2017     GEN- The patient is well appearing, alert and oriented x 3 today.   Head- normocephalic, atraumatic Eyes-  Sclera clear, conjunctiva pink Ears- hearing intact Oropharynx- clear Neck- supple, no JVP Lymph- no cervical lymphadenopathy Lungs- Clear to ausculation bilaterally, normal work of breathing Heart- Regular  rate and rhythm, no murmurs, rubs or gallops, PMI not laterally displaced GI- soft, NT, ND, + BS Extremities- no clubbing, cyanosis, or edema MS- no significant deformity or atrophy Skin- no rash or lesion Psych- euthymic mood, full affect Neuro- strength and sensation are intact  EKG-NSR at 73 bpm, pr int 190 ms, qrs int 122 ms, qtc 446 ms, inferior inferior MI, age undetermined Epic records reviewed    Assessment and Plan: 1.Persisitent afib Successful cardioversion 9/17 and is holding SR Continue on amiodarone 200 mg one a day Continue ASA/ Pradaxa for 150 mg bid for chadsvasc score of  at least 4 Continue metoprolol 50 mg bid   2. AS Per Dr. Angelena Form  F/u with Dr. Angelena Form 11/16  Steven Maldonado, [facility] [address] [place], [place] [address] [phone]

## 2017-08-19 DIAGNOSIS — H25093 Other age-related incipient cataract, bilateral: Secondary | ICD-10-CM | POA: Diagnosis not present

## 2017-08-25 ENCOUNTER — Other Ambulatory Visit: Payer: Self-pay | Admitting: *Deleted

## 2017-08-25 ENCOUNTER — Other Ambulatory Visit: Payer: Self-pay | Admitting: Physician Assistant

## 2017-08-25 DIAGNOSIS — IMO0001 Reserved for inherently not codable concepts without codable children: Secondary | ICD-10-CM

## 2017-08-25 DIAGNOSIS — Z794 Long term (current) use of insulin: Principal | ICD-10-CM

## 2017-08-25 DIAGNOSIS — E1165 Type 2 diabetes mellitus with hyperglycemia: Principal | ICD-10-CM

## 2017-08-25 MED ORDER — FUROSEMIDE 20 MG PO TABS: 20.0000 mg | ORAL_TABLET | Freq: Every day | ORAL | 3 refills | Status: DC

## 2017-08-27 ENCOUNTER — Ambulatory Visit: Payer: Medicare Other | Admitting: Physician Assistant

## 2017-09-01 DIAGNOSIS — H2511 Age-related nuclear cataract, right eye: Secondary | ICD-10-CM | POA: Diagnosis not present

## 2017-09-01 DIAGNOSIS — E119 Type 2 diabetes mellitus without complications: Secondary | ICD-10-CM | POA: Diagnosis not present

## 2017-09-01 DIAGNOSIS — H2513 Age-related nuclear cataract, bilateral: Secondary | ICD-10-CM | POA: Diagnosis not present

## 2017-09-02 ENCOUNTER — Other Ambulatory Visit: Payer: Self-pay

## 2017-09-02 MED ORDER — FUROSEMIDE 20 MG PO TABS: 20.0000 mg | ORAL_TABLET | Freq: Every day | ORAL | 3 refills | Status: DC

## 2017-09-26 ENCOUNTER — Encounter (INDEPENDENT_AMBULATORY_CARE_PROVIDER_SITE_OTHER): Payer: Self-pay

## 2017-09-26 ENCOUNTER — Ambulatory Visit (INDEPENDENT_AMBULATORY_CARE_PROVIDER_SITE_OTHER): Payer: Medicare Other | Admitting: Cardiovascular Disease

## 2017-09-26 ENCOUNTER — Encounter: Payer: Self-pay | Admitting: Cardiovascular Disease

## 2017-09-26 VITALS — BP 128/72 | HR 59 | Ht 71.0 in | Wt 255.8 lb

## 2017-09-26 DIAGNOSIS — I480 Paroxysmal atrial fibrillation: Secondary | ICD-10-CM

## 2017-09-26 DIAGNOSIS — E7800 Pure hypercholesterolemia, unspecified: Secondary | ICD-10-CM | POA: Diagnosis not present

## 2017-09-26 DIAGNOSIS — I2510 Atherosclerotic heart disease of native coronary artery without angina pectoris: Secondary | ICD-10-CM

## 2017-09-26 DIAGNOSIS — I10 Essential (primary) hypertension: Secondary | ICD-10-CM | POA: Diagnosis not present

## 2017-09-26 DIAGNOSIS — I350 Nonrheumatic aortic (valve) stenosis: Secondary | ICD-10-CM | POA: Diagnosis not present

## 2017-09-26 NOTE — H&P (View-Only) (Signed)
Chief Complaint  Patient presents with  . Coronary Artery Disease    History of Present Illness: 76 yo male with history of severe aortic stenosis, paroxysmal atrial fibrillation, uncontrolled DM, HTN, hyperlipidemia, PAD and CAD here for cardiac follow up. He was found to have atrial fibrillation in 2012. Cardiac cath in 2012 with occluded RCA which filled from collaterals. He was cardioverted to sinus. He has been followed closely for severe aortic stenosis. Most recent echo in August 2018 with heavily calcified aortic valve that opened poorly with mean gradient 26 mmHg, peak gradient 45 mmHg and AVA 0.8cm2. LV function is normal. His PAD is followed by Dr. Fletcher Anon. 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 He was seen in the office 11/02/15 for recurrence of atrial fibrillation. This was the first known sustained episode since 2012. He reported med compliance and subsequently underwent DCCV to NSR the following day. He was in atrial fibrillation when seen by Dr. Fletcher Anon in the [facility] May 2018. Lopressor was increased. He was cardioverted to sinus June 2018 but returned quickly to atrial fibrillation. He was loaded with amiodarone in August 2018 and then was seen in September 2018 by Dr. Rayann Heman. Repeat DCCV on 07/28/17 and converted to sinus. He has been seen in the atrial fib clinic in October 2018 and was in sinus. He has been anti-coagulated on Pradaxa. We have discussed TAVR during his office visits with me in 2018.    He is here today for follow up. The patient denies any chest pain, palpitations, lower extremity edema, orthopnea, PND, dizziness, near syncope or syncope. He has dyspnea with exertion. He has no energy. He has not checked his blood sugar in months. Last A1C was over 12 in June 2018 but he missed f/u in primary care.   Primary Care Physician: Rennis Golden   Past Medical History:  Diagnosis Date  . Aortic stenosis    severe  . Cataract  associated with type 2 diabetes mellitus ([place]) 10/06/2013  . CKD (chronic kidney disease), stage III ([place])   . Coronary artery disease    a. Cath February 2012 in [facility], occluded RCA with collaterals  . Diabetic ulcer of heel ([place]) 07/14/2014  . DM type 2 (diabetes mellitus, type 2) ([place])   . Essential hypertension   . Hyperkalemia   . Hyperlipidemia   . NSTEMI (non-ST elevated myocardial infarction) ([place]) 12/2010  . Obstructive sleep apnea    2012  . Persistent atrial fibrillation ([place]) 2012   multiple prior cardioversions  . PVD (peripheral vascular disease) ([place])    a. s/p R popliteal artery stenosis tx with drug-coated balloon 05/2014, followed by Dr. Fletcher Anon.  . Skin cancer    "cut off back of neck X 2; off left upper arm; right wrist, near right shoulder blade; several burned off other areas of my body" (06/08/2014)  . Sleep apnea with use of continuous positive airway pressure (CPAP)    04-11-11 AHI was 32.9 and titrated to 15 cm H20, DME is [facility]  . Subclinical hypothyroidism     Past Surgical History:  Procedure Laterality Date  . ABDOMINAL ANGIOGRAM N/A 06/08/2014   Performed by Wellington Hampshire, MD at [facility] CATH LAB  . APPENDECTOMY  1965  . CARDIAC CATHETERIZATION  12/2010  . CARDIOVERSION  07/2011  . CARDIOVERSION N/A 07/28/2017   Performed by Dorothy Spark, MD at [place]  . CARDIOVERSION N/A 05/08/2017   Performed by Meda Coffee,  Jamse Belfast, MD at [facility] ENDOSCOPY  . CARDIOVERSION N/A 11/03/2015   Performed by Lelon Perla, MD at [place]  . CARDIOVERSION N/A 04/18/2014   Performed by Dorothy Spark, MD at [place]  . LOWER EXTREMITY ANGIOGRAM N/A 06/08/2014   Performed by Wellington Hampshire, MD at [facility] CATH LAB  . POPLITEAL ARTERY ANGIOPLASTY Right 06/08/2014   Archie Endo 06/08/2014  . PTA FEMORAL POPLITEAL ARTERY Right 06/08/2014   Performed by Wellington Hampshire, MD at [facility] CATH LAB  . SKIN CANCER EXCISION Bilateral    "have had them cut off back of neck X 2; off  left upper arm; right wrist, near right shoulder blade" (06/08/2014)    Current Outpatient Medications  Medication Sig Dispense Refill  . amiodarone (PACERONE) 200 MG tablet Take 200 mg by mouth daily.    [name] Kitchen aspirin EC 81 MG tablet Take 81 mg by mouth daily.    [name] Kitchen atorvastatin (LIPITOR) 80 MG tablet TAKE 1 TABLET EVERY DAY AT BEDTIME 90 tablet 0  . BD PEN NEEDLE NANO U/F 32G X 4 MM MISC USE 4 (FOUR) TIMES DAILY. 400 each 1  . Blood Glucose Monitoring Suppl (FREESTYLE LITE) DEVI 1 each by Does not apply route 2 (two) times daily. 1 each 0  . Choline Fenofibrate 135 MG capsule TAKE 1 CAPSULE DAILY 90 capsule 3  . docusate sodium (COLACE) 100 MG capsule Take 100 mg by mouth daily as needed for mild constipation.    . fesoterodine (TOVIAZ) 4 MG TB24 tablet Take 4 mg by mouth daily.    . furosemide (LASIX) 20 MG tablet Take 1 tablet (20 mg total) by mouth daily. 90 tablet 3  . glipiZIDE (GLUCOTROL XL) 10 MG 24 hr tablet TAKE 1 TABLET (10 MG TOTAL) BY MOUTH DAILY WITH BREAKFAST. 30 tablet 5  . glucose blood (FREESTYLE LITE) test strip 1 each by Other route 4 (four) times daily -  before meals and at bedtime. 450 each 3  . Insulin Glargine (BASAGLAR KWIKPEN) 100 UNIT/ML SOPN Inject 0.65 mLs (65 Units total) into the skin at bedtime. 30 mL 0  . Insulin Glargine (BASAGLAR KWIKPEN) 100 UNIT/ML SOPN INJECT 55 UNITS EVERY NIGHTAT BEDTIME 60 mL 3  . insulin lispro (HUMALOG) 100 UNIT/ML KiwkPen Inject 0.05 mLs (5 Units total) into the skin 3 (three) times daily. 15 mL 3  . magnesium oxide (MAG-OX) 400 MG tablet Take 1 tablet (400 mg total) by mouth daily. 30 tablet 11  . metoprolol (LOPRESSOR) 50 MG tablet Take one and one-half tablet by mouth twice a day 90 tablet 6  . MYRBETRIQ 25 MG TB24 tablet TAKE 1 TABLET BY MOUTH EVERY DAY 90 tablet 0  . PRADAXA 150 MG CAPS capsule TAKE 1 CAPSULE EVERY 12    HOURS 180 capsule 3   No current facility-administered medications for this visit.     No Known  Allergies  Social History   Socioeconomic History  . Marital status: Married    Spouse name: [name]  . Number of children: 1  . Years of education: 58  . Highest education level: Not on file  Social Needs  . Financial resource strain: Not on file  . Food insecurity - worry: Not on file  . Food insecurity - inability: Not on file  . Transportation needs - medical: Not on file  . Transportation needs - non-medical: Not on file  Occupational History  . Occupation: retired    Fish farm manager: [employer]  Tobacco Use  .  Smoking status: Former Smoker    Packs/day: 2.00    Years: 14.00    Pack years: 28.00    Types: Cigarettes    Last attempt to quit: 11/11/1972    Years since quitting: 44.9  . Smokeless tobacco: Never Used  Substance and Sexual Activity  . Alcohol use: No    Comment: quit in 1984  . Drug use: No  . Sexual activity: Not Currently  Other Topics Concern  . 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   Patient has one child and his wife has one child.   Patient is retired.   Patient has a high school education.   Patient is right-handed.   Patient drinks very little caffeine.    Family History  Problem Relation Age of Onset  . Diabetes Mother   . Heart attack Mother   . Hypertension Mother   . Heart attack Father   . Heart failure Father   . Hypertension Father   . Diabetes Father   . Diabetes Sister   . Diabetes Brother   . Diabetes Other   . Diabetes Daughter        TYPE ll  . Heart Problems Daughter   . Hypertension Sister   . Hypertension Brother   . Stroke Brother     Review of Systems:  As stated in the HPI and otherwise negative.   BP 128/72   Pulse (!) 59   Ht 5\' 11"  (1.803 m)   Wt 255 lb 12.8 oz (116 kg)   SpO2 97%   BMI 35.68 kg/m   Physical Examination: General: Well developed, well nourished, NAD  HEENT: OP clear, mucus membranes moist  SKIN: warm, dry. No  rashes. Neuro: No focal deficits  Musculoskeletal: Muscle strength 5/5 all ext  Psychiatric: Mood and affect normal  Neck: No JVD, no carotid bruits, no thyromegaly, no lymphadenopathy.  Lungs:Clear bilaterally, no wheezes, rhonci, crackles Cardiovascular: Regular rate and rhythm. Loud, harsh, 3/6 systolic murmur, late peaking. No gallops or rubs. Abdomen:Soft. Bowel sounds present. Non-tender.  Extremities: No lower extremity edema. Pulses are 2 + in the bilateral DP/PT.  Echo August 2018: - Procedure narrative: Transthoracic echocardiography. Image   quality was suboptimal. The study was technically difficult, as a   result of poor acoustic windows, poor sound wave transmission,   and body habitus. Intravenous contrast (Definity) was   administered. - Left ventricle: The cavity size was normal. Wall thickness was   normal. Systolic function was normal. The estimated ejection   fraction was in the range of 50% to 55%. The study is not   technically sufficient to allow evaluation of LV diastolic   function. - Aortic valve: Heavily calcified aortic valve with severe   stenosis. Mean gradient (S): 26 mm Hg. Peak gradient (S): 45 mm   Hg. Valve area (VTI): 0.81 cm^2. Valve area (Vmax): 0.92 cm^2.   Valve area (Vmean): 0.81 cm^2. - Mitral valve: Calcified annulus. Mildly thickened leaflets . - Left atrium: Moderately dilated.  Impressions:  - Technically difficult study. LVEF lower at 50-55%. A-fib with RVR   is present. There is likely severe aortic stenosis, however, the   mean aortic valve gradient is lower in this study compared to the   prior study, but calculated AVA is still around 0.9 cm2 based on   an LVOT diameter of 2.3 cm.  ------------------------------------------------------------------- Labs, prior tests, procedures, and surgery: Transthoracic  echocardiography (11/19/2016).    The aortic valve showed moderate to severe stenosis.  EF was 60%. Aortic valve:  peak gradient of 60 mm Hg and mean gradient of 32 mm Hg.  ------------------------------------------------------------------- Study data:  Comparison was made to the study of 11/19/2016.  Study status:  Routine.  Procedure:  The patient reported no pain pre or post test. Transthoracic echocardiography. Image quality was suboptimal. The study was technically difficult, as a result of poor acoustic windows, poor sound wave transmission, and body habitus. Intravenous contrast (Definity) was administered.  Study completion:  There were no complications.          Transthoracic echocardiography.  M-mode, complete 2D, spectral Doppler, and color Doppler.  Birthdate:  Patient birthdate: 1941/11/04.  Age:  Patient is 76 yr old.  Sex:  Gender: male.    BMI: 35.2 kg/m^2.  Blood pressure:     132/82  Patient status:  Outpatient.  Study date: Study date: 06/16/2017. Study time: 01:47 PM.  Location:  [place] [facility]  -------------------------------------------------------------------  ------------------------------------------------------------------- Left ventricle:  The cavity size was normal. Wall thickness was normal. Systolic function was normal. The estimated ejection fraction was in the range of 50% to 55%. The study is not technically sufficient to allow evaluation of LV diastolic function.  ------------------------------------------------------------------- Aortic valve:  Heavily calcified aortic valve with severe stenosis.  Doppler:     VTI ratio of LVOT to aortic valve: 0.19. Valve area (VTI): 0.81 cm^2. Indexed valve area (VTI): 0.33 cm^2/m^2. Peak velocity ratio of LVOT to aortic valve: 0.22. Valve area (Vmax): 0.92 cm^2. Indexed valve area (Vmax): 0.38 cm^2/m^2. Mean velocity ratio of LVOT to aortic valve: 0.2. Valve area (Vmean): 0.81 cm^2. Indexed valve area (Vmean): 0.33 cm^2/m^2.    Mean gradient (S): 26 mm Hg. Peak gradient (S): 45 mm  Hg.  ------------------------------------------------------------------- Aorta:  Aortic root: The aortic root was normal in size. Ascending aorta: The ascending aorta was normal in size.  ------------------------------------------------------------------- Mitral valve:   Calcified annulus. Mildly thickened leaflets . Doppler:  There was trivial regurgitation.  ------------------------------------------------------------------- Left atrium:  Moderately dilated.  ------------------------------------------------------------------- Right ventricle:  Poorly visualized.  ------------------------------------------------------------------- Pulmonic valve:    The valve appears to be grossly normal. Doppler:  There was trivial regurgitation.  ------------------------------------------------------------------- Pulmonary artery:   The main pulmonary artery was normal-sized.  ------------------------------------------------------------------- Right atrium:  The atrium was normal in size.  ------------------------------------------------------------------- Pericardium:  There was no pericardial effusion.  ------------------------------------------------------------------- Systemic veins: Inferior vena cava: The vessel was normal in size. The respirophasic diameter changes were in the normal range (>= 50%), consistent with normal central venous pressure.  ------------------------------------------------------------------- Measurements   Left ventricle                            Value          Reference  LV ID, ED, PLAX chordal           (H)     54.5  mm       43 - 52  LV ID, ES, PLAX chordal           (H)     43    mm       23 - 38  LV fx shortening, PLAX chordal    (L)     21    %        >=29  LV PW thickness, ED  10.9  mm       ---------  IVS/LV PW ratio, ED                       0.95           <=1.3  Stroke volume, 2D                         49    ml        ---------  Stroke volume/bsa, 2D                     20    ml/m^2   ---------    Ventricular septum                        Value          Reference  IVS thickness, ED                         10.4  mm       ---------    LVOT                                      Value          Reference  LVOT ID, S                                23    mm       ---------  LVOT area                                 4.15  cm^2     ---------  LVOT peak velocity, S                     73.6  cm/s     ---------  LVOT mean velocity, S                     45.7  cm/s     ---------  LVOT VTI, S                               12.9  cm       ---------  Stroke volume (SV), LVOT DP               53.6  ml       ---------  Stroke index (SV/bsa), LVOT DP            22    ml/m^2   ---------    Aortic valve                              Value          Reference  Aortic valve peak velocity, S             334   cm/s     ---------  Aortic valve mean velocity, S             234   cm/s     ---------  Aortic valve VTI, S                       66.2  cm       ---------  Aortic mean gradient, S                   26    mm Hg    ---------  Aortic peak gradient, S                   45    mm Hg    ---------  VTI ratio, LVOT/AV                        0.19           ---------  Aortic valve area, VTI                    0.81  cm^2     ---------  Aortic valve area/bsa, VTI                0.33  cm^2/m^2 ---------  Velocity ratio, peak, LVOT/AV             0.22           ---------  Aortic valve area, peak velocity          0.92  cm^2     ---------  Aortic valve area/bsa, peak               0.38  cm^2/m^2 ---------  velocity  Velocity ratio, mean, LVOT/AV             0.2            ---------  Aortic valve area, mean velocity          0.81  cm^2     ---------  Aortic valve area/bsa, mean               0.33  cm^2/m^2 ---------  velocity    Aorta                                     Value          Reference  Aortic root ID, ED                         37    mm       ---------  Ascending aorta ID, A-P, S                31    mm       ---------    Left atrium                               Value          Reference  LA ID, A-P, ES                            44    mm       ---------  LA ID/bsa, A-P                            1.81  cm/m^2   <=2.2  LA volume, S                              90.6  ml       ---------  LA volume/bsa, S                          37.2  ml/m^2   ---------  LA volume, ES, 1-p A4C                    67.2  ml       ---------  LA volume/bsa, ES, 1-p A4C                27.6  ml/m^2   ---------  LA volume, ES, 1-p A2C                    108   ml       ---------  LA volume/bsa, ES, 1-p A2C                44.4  ml/m^2   ---------    Systemic veins                            Value          Reference  Estimated CVP                             3     mm Hg    ---------  EKG:  EKG is  not  ordered today. The ekg ordered today demonstrates   Recent Labs: 05/01/2017: ALT 19; TSH 5.76 07/16/2017: BUN 20; Creatinine, Ser 1.33; Platelets 181 07/28/2017: Hemoglobin 13.6; Potassium 4.2; Sodium 139   Lipid Panel    Component Value Date/Time   CHOL 156 05/01/2017 1216   TRIG 158 (H) 05/01/2017 1216   HDL 29 (L) 05/01/2017 1216   CHOLHDL 5.4 (H) 05/01/2017 1216   VLDL 32 (H) 05/01/2017 1216   LDLCALC 95 05/01/2017 1216     Wt Readings from Last 3 Encounters:  09/26/17 255 lb 12.8 oz (116 kg)  08/18/17 252 lb 6.4 oz (114.5 kg)  07/16/17 252 lb 3.2 oz (114.4 kg)     Other studies Reviewed: Additional studies/ records that were reviewed today include: . Review of the above records demonstrates:    Assessment and Plan:   1. CAD without angina: He has no chest pain suggestive of angina. Will continue ASA, statin and beta blocker.     2. Severe Aortic stenosis: He has severe symptomatic AS. His gradient has been as high as 39 mm Hg in January 2017 and by lastest echo his mean gradient is in the high 20s. His AVA is 0.8cm2.  I reviewed his echo images and the aortic valve leaflets are thickened and do not open well. He is having dyspnea and fatigue. I think he would benefit from AVR but he is not willing to consider surgical AVR. He would be a good candidate for TAVR. I reviewed the TAVR procedure again today with the patient and his wife. I will start the TAVR process today. I will arrange a right and left heart cath at [facility] on 10/08/17 at 8:30 am to get a hemodynamic assessment of his aortic stenosis. Risks  and benefits of cath reviewed with pt today. He agrees to proceed. We will then plan CT scans, PFTs, carotid dopplers and PT evaluation followed by visits with Dr. Cyndia Bent and Dr. Roxy Manns. Will hold Pradaxa pre-cath and arrange pre-cath labs today.   STS Risk Score:  Risk of Mortality: 2.909% Renal Failure: 9.285% Permanent Stroke: 0.986% Prolonged Ventilation: 9.816% DSW Infection: 0.369% Reoperation: 4.135% Morbidity or Mortality: 16.924% Short Length of Stay: 20.563% Long Length of Stay: 11.387%   3. ATRIAL FIBRILLATION, paroxysmal: He is maintaining sinus following DCCV on amiodarone and metoprolol. He is on Pradaxa. He is being followed in the atrial fibrillation clinic.    4. HTN: BP controlled. No changes.   5. HLD: Continue statin. LDL near goal.   6. PAD: Stable. Followed in [facility] clinic.   7. Diabetes mellitus: His diabetes is uncontrolled due to his unwillingness to follow up with primary care and check his blood sugars. I have asked him to call primary care to discuss. Last A1C in June over 12. He may need to see endocrinology for aggressive planning for better control.   Current medicines are reviewed at length with the patient today.  The patient does not have concerns regarding medicines.  The following changes have been made:  no change  Labs/ tests ordered today include:   Orders Placed This Encounter  Procedures  . Basic Metabolic Panel (BMET)  . CBC w/Diff  . INR/PT    Disposition:   FU with  the TAVR team   Signed, [name], MD 09/26/2017 3:30 PM    [address], [place], [place]  [address] Phone: [phone]; Fax: [phone]

## 2017-09-26 NOTE — Patient Instructions (Signed)
   [facility] OFF[facility] [address] [place] [address] Dept: [phone] Loc: [phone]  ALEXYS GASSETT  09/26/2017  You are scheduled for a Cardiac Catheterization on Wednesday, November 28 with Dr. [name].  1. Please arrive at the [facility] (Main Entrance A) at [facility]: [address] [place], [place] [address] at 6:30 AM (two hours before your procedure to ensure your preparation). Free valet parking service is available.   Special note: Every effort is made to have your procedure done on time. Please understand that emergencies sometimes delay scheduled procedures.  2. Diet: Do not eat or drink anything after midnight prior to your procedure except sips of water to take medications.  3. Labs: Lab work was done in office on 09/26/17  4. Medication instructions in preparation for your procedure: Stop Pradaxa after evening dose on 10/05/17 Take half your normal insulin dose the evening prior to procedure. Do not take any insulin or glipizide the morning of the procedure.  On the morning of your procedure, take your Aspirin and any morning medicines NOT listed above.  You may use sips of water.  5. Plan for one night stay--bring personal belongings. 6. Bring a current list of your medications and current insurance cards. 7. You MUST have a responsible person to drive you home. 8. Someone MUST be with you the first 24 hours after you arrive home or your discharge will be delayed. 9. Please wear clothes that are easy to get on and off and wear slip-on shoes.  Thank you for allowing [place] to care for you!   -- [facility]

## 2017-09-26 NOTE — Progress Notes (Signed)
Chief Complaint  Patient presents with  . Coronary Artery Disease    History of Present Illness: 76 yo male with history of severe aortic stenosis, paroxysmal atrial fibrillation, uncontrolled DM, HTN, hyperlipidemia, PAD and CAD here for cardiac follow up. He was found to have atrial fibrillation in 2012. Cardiac cath in 2012 with occluded RCA which filled from collaterals. He was cardioverted to sinus. He has been followed closely for severe aortic stenosis. Most recent echo in August 2018 with heavily calcified aortic valve that opened poorly with mean gradient 26 mmHg, peak gradient 45 mmHg and AVA 0.8cm2. LV function is normal. His PAD is followed by Dr. Fletcher Anon. 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 He was seen in the office 11/02/15 for recurrence of atrial fibrillation. This was the first known sustained episode since 2012. He reported med compliance and subsequently underwent DCCV to NSR the following day. He was in atrial fibrillation when seen by Dr. Fletcher Anon in the [facility] clinic May 2018. Lopressor was increased. He was cardioverted to sinus June 2018 but returned quickly to atrial fibrillation. He was loaded with amiodarone in August 2018 and then was seen in September 2018 by Dr. Rayann Heman. Repeat DCCV on 07/28/17 and converted to sinus. He has been seen in the atrial fib clinic in October 2018 and was in sinus. He has been anti-coagulated on Pradaxa. We have discussed TAVR during his office visits with me in 2018.    He is here today for follow up. The patient denies any chest pain, palpitations, lower extremity edema, orthopnea, PND, dizziness, near syncope or syncope. He has dyspnea with exertion. He has no energy. He has not checked his blood sugar in months. Last A1C was over 12 in June 2018 but he missed f/u in primary care.   Primary Care Physician: Rennis Golden   Past Medical History:  Diagnosis Date  . Aortic stenosis    severe  . Cataract  associated with type 2 diabetes mellitus ([place]) 10/06/2013  . CKD (chronic kidney disease), stage III ([place])   . Coronary artery disease    a. Cath February 2012 in [place], occluded RCA with collaterals  . Diabetic ulcer of heel ([place]) 07/14/2014  . DM type 2 (diabetes mellitus, type 2) ([place])   . Essential hypertension   . Hyperkalemia   . Hyperlipidemia   . NSTEMI (non-ST elevated myocardial infarction) ([place]) 12/2010  . Obstructive sleep apnea    2012  . Persistent atrial fibrillation ([place]) 2012   multiple prior cardioversions  . PVD (peripheral vascular disease) ([place])    a. s/p R popliteal artery stenosis tx with drug-coated balloon 05/2014, followed by Dr. Fletcher Anon.  . Skin cancer    "cut off back of neck X 2; off left upper arm; right wrist, near right shoulder blade; several burned off other areas of my body" (06/08/2014)  . Sleep apnea with use of continuous positive airway pressure (CPAP)    04-11-11 AHI was 32.9 and titrated to 15 cm H20, DME is [facility]  . Subclinical hypothyroidism     Past Surgical History:  Procedure Laterality Date  . ABDOMINAL ANGIOGRAM N/A 06/08/2014   Performed by Wellington Hampshire, MD at [facility] CATH LAB  . APPENDECTOMY  1965  . CARDIAC CATHETERIZATION  12/2010  . CARDIOVERSION  07/2011  . CARDIOVERSION N/A 07/28/2017   Performed by Dorothy Spark, MD at [facility]  . CARDIOVERSION N/A 05/08/2017   Performed by Meda Coffee,  Jamse Belfast, MD at [facility] ENDOSCOPY  . CARDIOVERSION N/A 11/03/2015   Performed by Lelon Perla, MD at [facility]  . CARDIOVERSION N/A 04/18/2014   Performed by Dorothy Spark, MD at [facility]  . LOWER EXTREMITY ANGIOGRAM N/A 06/08/2014   Performed by Wellington Hampshire, MD at [facility] CATH LAB  . POPLITEAL ARTERY ANGIOPLASTY Right 06/08/2014   Archie Endo 06/08/2014  . PTA FEMORAL POPLITEAL ARTERY Right 06/08/2014   Performed by Wellington Hampshire, MD at [facility] CATH LAB  . SKIN CANCER EXCISION Bilateral    "have had them cut off back of neck X 2; off  left upper arm; right wrist, near right shoulder blade" (06/08/2014)    Current Outpatient Medications  Medication Sig Dispense Refill  . amiodarone (PACERONE) 200 MG tablet Take 200 mg by mouth daily.    [name] Kitchen aspirin EC 81 MG tablet Take 81 mg by mouth daily.    [name] Kitchen atorvastatin (LIPITOR) 80 MG tablet TAKE 1 TABLET EVERY DAY AT BEDTIME 90 tablet 0  . BD PEN NEEDLE NANO U/F 32G X 4 MM MISC USE 4 (FOUR) TIMES DAILY. 400 each 1  . Blood Glucose Monitoring Suppl (FREESTYLE LITE) DEVI 1 each by Does not apply route 2 (two) times daily. 1 each 0  . Choline Fenofibrate 135 MG capsule TAKE 1 CAPSULE DAILY 90 capsule 3  . docusate sodium (COLACE) 100 MG capsule Take 100 mg by mouth daily as needed for mild constipation.    . fesoterodine (TOVIAZ) 4 MG TB24 tablet Take 4 mg by mouth daily.    . furosemide (LASIX) 20 MG tablet Take 1 tablet (20 mg total) by mouth daily. 90 tablet 3  . glipiZIDE (GLUCOTROL XL) 10 MG 24 hr tablet TAKE 1 TABLET (10 MG TOTAL) BY MOUTH DAILY WITH BREAKFAST. 30 tablet 5  . glucose blood (FREESTYLE LITE) test strip 1 each by Other route 4 (four) times daily -  before meals and at bedtime. 450 each 3  . Insulin Glargine (BASAGLAR KWIKPEN) 100 UNIT/ML SOPN Inject 0.65 mLs (65 Units total) into the skin at bedtime. 30 mL 0  . Insulin Glargine (BASAGLAR KWIKPEN) 100 UNIT/ML SOPN INJECT 55 UNITS EVERY NIGHTAT BEDTIME 60 mL 3  . insulin lispro (HUMALOG) 100 UNIT/ML KiwkPen Inject 0.05 mLs (5 Units total) into the skin 3 (three) times daily. 15 mL 3  . magnesium oxide (MAG-OX) 400 MG tablet Take 1 tablet (400 mg total) by mouth daily. 30 tablet 11  . metoprolol (LOPRESSOR) 50 MG tablet Take one and one-half tablet by mouth twice a day 90 tablet 6  . MYRBETRIQ 25 MG TB24 tablet TAKE 1 TABLET BY MOUTH EVERY DAY 90 tablet 0  . PRADAXA 150 MG CAPS capsule TAKE 1 CAPSULE EVERY 12    HOURS 180 capsule 3   No current facility-administered medications for this visit.     No Known  Allergies  Social History   Socioeconomic History  . Marital status: Married    Spouse name: [name]  . Number of children: 1  . Years of education: 57  . Highest education level: Not on file  Social Needs  . Financial resource strain: Not on file  . Food insecurity - worry: Not on file  . Food insecurity - inability: Not on file  . Transportation needs - medical: Not on file  . Transportation needs - non-medical: Not on file  Occupational History  . Occupation: retired    Fish farm manager: [employer]  Tobacco Use  .  Smoking status: Former Smoker    Packs/day: 2.00    Years: 14.00    Pack years: 28.00    Types: Cigarettes    Last attempt to quit: 11/11/1972    Years since quitting: 44.9  . Smokeless tobacco: Never Used  Substance and Sexual Activity  . Alcohol use: No    Comment: quit in 1984  . Drug use: No  . Sexual activity: Not Currently  Other Topics Concern  . 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   Patient has one child and his wife has one child.   Patient is retired.   Patient has a high school education.   Patient is right-handed.   Patient drinks very little caffeine.    Family History  Problem Relation Age of Onset  . Diabetes Mother   . Heart attack Mother   . Hypertension Mother   . Heart attack Father   . Heart failure Father   . Hypertension Father   . Diabetes Father   . Diabetes Sister   . Diabetes Brother   . Diabetes Other   . Diabetes Daughter        TYPE ll  . Heart Problems Daughter   . Hypertension Sister   . Hypertension Brother   . Stroke Brother     Review of Systems:  As stated in the HPI and otherwise negative.   BP 128/72   Pulse (!) 59   Ht 5\' 11"  (1.803 m)   Wt 255 lb 12.8 oz (116 kg)   SpO2 97%   BMI 35.68 kg/m   Physical Examination: General: Well developed, well nourished, NAD  HEENT: OP clear, mucus membranes moist  SKIN: warm, dry. No  rashes. Neuro: No focal deficits  Musculoskeletal: Muscle strength 5/5 all ext  Psychiatric: Mood and affect normal  Neck: No JVD, no carotid bruits, no thyromegaly, no lymphadenopathy.  Lungs:Clear bilaterally, no wheezes, rhonci, crackles Cardiovascular: Regular rate and rhythm. Loud, harsh, 3/6 systolic murmur, late peaking. No gallops or rubs. Abdomen:Soft. Bowel sounds present. Non-tender.  Extremities: No lower extremity edema. Pulses are 2 + in the bilateral DP/PT.  Echo August 2018: - Procedure narrative: Transthoracic echocardiography. Image   quality was suboptimal. The study was technically difficult, as a   result of poor acoustic windows, poor sound wave transmission,   and body habitus. Intravenous contrast (Definity) was   administered. - Left ventricle: The cavity size was normal. Wall thickness was   normal. Systolic function was normal. The estimated ejection   fraction was in the range of 50% to 55%. The study is not   technically sufficient to allow evaluation of LV diastolic   function. - Aortic valve: Heavily calcified aortic valve with severe   stenosis. Mean gradient (S): 26 mm Hg. Peak gradient (S): 45 mm   Hg. Valve area (VTI): 0.81 cm^2. Valve area (Vmax): 0.92 cm^2.   Valve area (Vmean): 0.81 cm^2. - Mitral valve: Calcified annulus. Mildly thickened leaflets . - Left atrium: Moderately dilated.  Impressions:  - Technically difficult study. LVEF lower at 50-55%. A-fib with RVR   is present. There is likely severe aortic stenosis, however, the   mean aortic valve gradient is lower in this study compared to the   prior study, but calculated AVA is still around 0.9 cm2 based on   an LVOT diameter of 2.3 cm.  ------------------------------------------------------------------- Labs, prior tests, procedures, and surgery: Transthoracic  echocardiography (11/19/2016).    The aortic valve showed moderate to severe stenosis.  EF was 60%. Aortic valve:  peak gradient of 60 mm Hg and mean gradient of 32 mm Hg.  ------------------------------------------------------------------- Study data:  Comparison was made to the study of 11/19/2016.  Study status:  Routine.  Procedure:  The patient reported no pain pre or post test. Transthoracic echocardiography. Image quality was suboptimal. The study was technically difficult, as a result of poor acoustic windows, poor sound wave transmission, and body habitus. Intravenous contrast (Definity) was administered.  Study completion:  There were no complications.          Transthoracic echocardiography.  M-mode, complete 2D, spectral Doppler, and color Doppler.  Birthdate:  Patient birthdate: Mar 10, 1941.  Age:  Patient is 76 yr old.  Sex:  Gender: male.    BMI: 35.2 kg/m^2.  Blood pressure:     132/82  Patient status:  Outpatient.  Study date: Study date: 06/16/2017. Study time: 01:47 PM.  Location:  [place] [facility]  -------------------------------------------------------------------  ------------------------------------------------------------------- Left ventricle:  The cavity size was normal. Wall thickness was normal. Systolic function was normal. The estimated ejection fraction was in the range of 50% to 55%. The study is not technically sufficient to allow evaluation of LV diastolic function.  ------------------------------------------------------------------- Aortic valve:  Heavily calcified aortic valve with severe stenosis.  Doppler:     VTI ratio of LVOT to aortic valve: 0.19. Valve area (VTI): 0.81 cm^2. Indexed valve area (VTI): 0.33 cm^2/m^2. Peak velocity ratio of LVOT to aortic valve: 0.22. Valve area (Vmax): 0.92 cm^2. Indexed valve area (Vmax): 0.38 cm^2/m^2. Mean velocity ratio of LVOT to aortic valve: 0.2. Valve area (Vmean): 0.81 cm^2. Indexed valve area (Vmean): 0.33 cm^2/m^2.    Mean gradient (S): 26 mm Hg. Peak gradient (S): 45 mm  Hg.  ------------------------------------------------------------------- Aorta:  Aortic root: The aortic root was normal in size. Ascending aorta: The ascending aorta was normal in size.  ------------------------------------------------------------------- Mitral valve:   Calcified annulus. Mildly thickened leaflets . Doppler:  There was trivial regurgitation.  ------------------------------------------------------------------- Left atrium:  Moderately dilated.  ------------------------------------------------------------------- Right ventricle:  Poorly visualized.  ------------------------------------------------------------------- Pulmonic valve:    The valve appears to be grossly normal. Doppler:  There was trivial regurgitation.  ------------------------------------------------------------------- Pulmonary artery:   The main pulmonary artery was normal-sized.  ------------------------------------------------------------------- Right atrium:  The atrium was normal in size.  ------------------------------------------------------------------- Pericardium:  There was no pericardial effusion.  ------------------------------------------------------------------- Systemic veins: Inferior vena cava: The vessel was normal in size. The respirophasic diameter changes were in the normal range (>= 50%), consistent with normal central venous pressure.  ------------------------------------------------------------------- Measurements   Left ventricle                            Value          Reference  LV ID, ED, PLAX chordal           (H)     54.5  mm       43 - 52  LV ID, ES, PLAX chordal           (H)     43    mm       23 - 38  LV fx shortening, PLAX chordal    (L)     21    %        >=29  LV PW thickness, ED  10.9  mm       ---------  IVS/LV PW ratio, ED                       0.95           <=1.3  Stroke volume, 2D                         49    ml        ---------  Stroke volume/bsa, 2D                     20    ml/m^2   ---------    Ventricular septum                        Value          Reference  IVS thickness, ED                         10.4  mm       ---------    LVOT                                      Value          Reference  LVOT ID, S                                23    mm       ---------  LVOT area                                 4.15  cm^2     ---------  LVOT peak velocity, S                     73.6  cm/s     ---------  LVOT mean velocity, S                     45.7  cm/s     ---------  LVOT VTI, S                               12.9  cm       ---------  Stroke volume (SV), LVOT DP               53.6  ml       ---------  Stroke index (SV/bsa), LVOT DP            22    ml/m^2   ---------    Aortic valve                              Value          Reference  Aortic valve peak velocity, S             334   cm/s     ---------  Aortic valve mean velocity, S             234   cm/s     ---------  Aortic valve VTI, S                       66.2  cm       ---------  Aortic mean gradient, S                   26    mm Hg    ---------  Aortic peak gradient, S                   45    mm Hg    ---------  VTI ratio, LVOT/AV                        0.19           ---------  Aortic valve area, VTI                    0.81  cm^2     ---------  Aortic valve area/bsa, VTI                0.33  cm^2/m^2 ---------  Velocity ratio, peak, LVOT/AV             0.22           ---------  Aortic valve area, peak velocity          0.92  cm^2     ---------  Aortic valve area/bsa, peak               0.38  cm^2/m^2 ---------  velocity  Velocity ratio, mean, LVOT/AV             0.2            ---------  Aortic valve area, mean velocity          0.81  cm^2     ---------  Aortic valve area/bsa, mean               0.33  cm^2/m^2 ---------  velocity    Aorta                                     Value          Reference  Aortic root ID, ED                         37    mm       ---------  Ascending aorta ID, A-P, S                31    mm       ---------    Left atrium                               Value          Reference  LA ID, A-P, ES                            44    mm       ---------  LA ID/bsa, A-P                            1.81  cm/m^2   <=2.2  LA volume, S                              90.6  ml       ---------  LA volume/bsa, S                          37.2  ml/m^2   ---------  LA volume, ES, 1-p A4C                    67.2  ml       ---------  LA volume/bsa, ES, 1-p A4C                27.6  ml/m^2   ---------  LA volume, ES, 1-p A2C                    108   ml       ---------  LA volume/bsa, ES, 1-p A2C                44.4  ml/m^2   ---------    Systemic veins                            Value          Reference  Estimated CVP                             3     mm Hg    ---------  EKG:  EKG is  not  ordered today. The ekg ordered today demonstrates   Recent Labs: 05/01/2017: ALT 19; TSH 5.76 07/16/2017: BUN 20; Creatinine, Ser 1.33; Platelets 181 07/28/2017: Hemoglobin 13.6; Potassium 4.2; Sodium 139   Lipid Panel    Component Value Date/Time   CHOL 156 05/01/2017 1216   TRIG 158 (H) 05/01/2017 1216   HDL 29 (L) 05/01/2017 1216   CHOLHDL 5.4 (H) 05/01/2017 1216   VLDL 32 (H) 05/01/2017 1216   LDLCALC 95 05/01/2017 1216     Wt Readings from Last 3 Encounters:  09/26/17 255 lb 12.8 oz (116 kg)  08/18/17 252 lb 6.4 oz (114.5 kg)  07/16/17 252 lb 3.2 oz (114.4 kg)     Other studies Reviewed: Additional studies/ records that were reviewed today include: . Review of the above records demonstrates:    Assessment and Plan:   1. CAD without angina: He has no chest pain suggestive of angina. Will continue ASA, statin and beta blocker.     2. Severe Aortic stenosis: He has severe symptomatic AS. His gradient has been as high as 39 mm Hg in January 2017 and by lastest echo his mean gradient is in the high 20s. His AVA is 0.8cm2.  I reviewed his echo images and the aortic valve leaflets are thickened and do not open well. He is having dyspnea and fatigue. I think he would benefit from AVR but he is not willing to consider surgical AVR. He would be a good candidate for TAVR. I reviewed the TAVR procedure again today with the patient and his wife. I will start the TAVR process today. I will arrange a right and left heart cath at [facility] on 10/08/17 at 8:30 am to get a hemodynamic assessment of his aortic stenosis. Risks  and benefits of cath reviewed with pt today. He agrees to proceed. We will then plan CT scans, PFTs, carotid dopplers and PT evaluation followed by visits with Dr. Cyndia Bent and Dr. Roxy Manns. Will hold Pradaxa pre-cath and arrange pre-cath labs today.   STS Risk Score:  Risk of Mortality: 2.909% Renal Failure: 9.285% Permanent Stroke: 0.986% Prolonged Ventilation: 9.816% DSW Infection: 0.369% Reoperation: 4.135% Morbidity or Mortality: 16.924% Short Length of Stay: 20.563% Long Length of Stay: 11.387%   3. ATRIAL FIBRILLATION, paroxysmal: He is maintaining sinus following DCCV on amiodarone and metoprolol. He is on Pradaxa. He is being followed in the atrial fibrillation clinic.    4. HTN: BP controlled. No changes.   5. HLD: Continue statin. LDL near goal.   6. PAD: Stable. Followed in [facility] clinic.   7. Diabetes mellitus: His diabetes is uncontrolled due to his unwillingness to follow up with primary care and check his blood sugars. I have asked him to call primary care to discuss. Last A1C in June over 12. He may need to see endocrinology for aggressive planning for better control.   Current medicines are reviewed at length with the patient today.  The patient does not have concerns regarding medicines.  The following changes have been made:  no change  Labs/ tests ordered today include:   Orders Placed This Encounter  Procedures  . Basic Metabolic Panel (BMET)  . CBC w/Diff  . INR/PT    Disposition:   FU with  the TAVR team   Signed, [name], MD 09/26/2017 3:30 PM    [address] [place], [place], [place]  [address] Phone: [phone]; Fax: [phone]

## 2017-09-27 LAB — CBC WITH DIFFERENTIAL/PLATELET
Basophils Absolute: 0.1 10*3/uL (ref 0.0–0.2)
Basos: 1 %
EOS (ABSOLUTE): 0.1 10*3/uL (ref 0.0–0.4)
Eos: 2 %
Hematocrit: 37.9 % (ref 37.5–51.0)
Hemoglobin: 12.9 g/dL — ABNORMAL LOW (ref 13.0–17.7)
Immature Grans (Abs): 0 10*3/uL (ref 0.0–0.1)
Immature Granulocytes: 1 %
Lymphocytes Absolute: 1.8 10*3/uL (ref 0.7–3.1)
Lymphs: 31 %
MCH: 31.5 pg (ref 26.6–33.0)
MCHC: 34 g/dL (ref 31.5–35.7)
MCV: 92 fL (ref 79–97)
Monocytes Absolute: 0.5 10*3/uL (ref 0.1–0.9)
Monocytes: 8 %
Neutrophils Absolute: 3.3 10*3/uL (ref 1.4–7.0)
Neutrophils: 57 %
Platelets: 169 10*3/uL (ref 150–379)
RBC: 4.1 x10E6/uL — ABNORMAL LOW (ref 4.14–5.80)
RDW: 13.7 % (ref 12.3–15.4)
WBC: 5.8 10*3/uL (ref 3.4–10.8)

## 2017-09-27 LAB — BASIC METABOLIC PANEL
BUN/Creatinine Ratio: 13 (ref 10–24)
BUN: 27 mg/dL (ref 8–27)
CO2: 22 mmol/L (ref 20–29)
Calcium: 9.5 mg/dL (ref 8.6–10.2)
Chloride: 101 mmol/L (ref 96–106)
Creatinine, Ser: 2.13 mg/dL — ABNORMAL HIGH (ref 0.76–1.27)
GFR calc Af Amer: 34 mL/min/{1.73_m2} — ABNORMAL LOW (ref >59)
GFR calc non Af Amer: 30 mL/min/{1.73_m2} — ABNORMAL LOW (ref >59)
Glucose: 312 mg/dL — ABNORMAL HIGH (ref 65–99)
Potassium: 5 mmol/L (ref 3.5–5.2)
Sodium: 136 mmol/L (ref 134–144)

## 2017-09-27 LAB — PROTIME-INR
INR: 1.3 — ABNORMAL HIGH (ref 0.8–1.2)
Prothrombin Time: 13.2 s — ABNORMAL HIGH (ref 9.1–12.0)

## 2017-09-29 ENCOUNTER — Other Ambulatory Visit: Payer: Self-pay | Admitting: *Deleted

## 2017-09-29 DIAGNOSIS — I2510 Atherosclerotic heart disease of native coronary artery without angina pectoris: Secondary | ICD-10-CM

## 2017-09-29 DIAGNOSIS — I480 Paroxysmal atrial fibrillation: Secondary | ICD-10-CM

## 2017-09-29 DIAGNOSIS — I350 Nonrheumatic aortic (valve) stenosis: Secondary | ICD-10-CM

## 2017-10-06 ENCOUNTER — Other Ambulatory Visit: Payer: Medicare Other | Admitting: *Deleted

## 2017-10-06 DIAGNOSIS — I480 Paroxysmal atrial fibrillation: Secondary | ICD-10-CM

## 2017-10-06 DIAGNOSIS — I2510 Atherosclerotic heart disease of native coronary artery without angina pectoris: Secondary | ICD-10-CM | POA: Diagnosis not present

## 2017-10-06 DIAGNOSIS — I350 Nonrheumatic aortic (valve) stenosis: Secondary | ICD-10-CM | POA: Diagnosis not present

## 2017-10-06 LAB — BASIC METABOLIC PANEL
BUN/Creatinine Ratio: 10 (ref 10–24)
BUN: 19 mg/dL (ref 8–27)
CO2: 21 mmol/L (ref 20–29)
Calcium: 10.4 mg/dL — ABNORMAL HIGH (ref 8.6–10.2)
Chloride: 102 mmol/L (ref 96–106)
Creatinine, Ser: 1.9 mg/dL — ABNORMAL HIGH (ref 0.76–1.27)
GFR calc Af Amer: 39 mL/min/{1.73_m2} — ABNORMAL LOW (ref >59)
GFR calc non Af Amer: 34 mL/min/{1.73_m2} — ABNORMAL LOW (ref >59)
Glucose: 146 mg/dL — ABNORMAL HIGH (ref 65–99)
Potassium: 4.1 mmol/L (ref 3.5–5.2)
Sodium: 141 mmol/L (ref 134–144)

## 2017-10-07 ENCOUNTER — Telehealth: Payer: Self-pay | Admitting: Nurse Practitioner

## 2017-10-07 ENCOUNTER — Telehealth: Payer: Self-pay

## 2017-10-07 NOTE — Telephone Encounter (Signed)
Patient contacted pre-catheterization at [facility] scheduled for:  10/08/2017 @ 0830 Verified arrival time and place:  [facility] @ 0630 Confirmed AM meds to be taken pre-cath with sip of water: Take ASA Last dose Pradaxa 11/25 pm dose Glargine-take half dose night prior Hold glipizide, insulin and lasix day of Confirmed patient has responsible person to drive home post procedure and observe patient for 24 hours:  yes Addl concerns:  Notified of Pt Cr still being higher than Pt baseline-notified family that this nurse had informed Dr. Angelena Form.  Family thankful for call.

## 2017-10-07 NOTE — Telephone Encounter (Signed)
   [facility] Pre-operative Risk Assessment    Request for surgical clearance:  1. What type of surgery is being performed? Bilateral Cataract Extraction with Intraocular Lens Implantation, Right eye followed by Left eye   2. When is this surgery scheduled? October 16, 2017   3. Are there any medications that need to be held prior to surgery and how long? None  4. Practice name and name of physician performing surgery? [facility]  5. What is your office phone and fax number?   P: [phone]    F: [phone]  6. Anesthesia type (None, local, MAC, general) ? Topical    Steven Maldonado 10/07/2017, 11:50 AM  _________________________________________________________________   (provider comments below)

## 2017-10-08 ENCOUNTER — Other Ambulatory Visit: Payer: Self-pay

## 2017-10-08 ENCOUNTER — Encounter (HOSPITAL_COMMUNITY)
Admission: RE | Disposition: A | Discharge status: Home / Self Care | Payer: Self-pay | Source: Ambulatory Visit | Attending: Cardiovascular Disease

## 2017-10-08 ENCOUNTER — Ambulatory Visit (HOSPITAL_COMMUNITY)
Admission: RE | Admit: 2017-10-08 | Discharge: 2017-10-08 | Disposition: A | Discharge status: Home / Self Care | Payer: Medicare Other | Source: Ambulatory Visit | Attending: Cardiovascular Disease | Admitting: Cardiovascular Disease

## 2017-10-08 DIAGNOSIS — I350 Nonrheumatic aortic (valve) stenosis: Secondary | ICD-10-CM | POA: Diagnosis not present

## 2017-10-08 DIAGNOSIS — I2510 Atherosclerotic heart disease of native coronary artery without angina pectoris: Secondary | ICD-10-CM | POA: Diagnosis not present

## 2017-10-08 DIAGNOSIS — I129 Hypertensive chronic kidney disease with stage 1 through stage 4 chronic kidney disease, or unspecified chronic kidney disease: Secondary | ICD-10-CM | POA: Diagnosis not present

## 2017-10-08 DIAGNOSIS — E785 Hyperlipidemia, unspecified: Secondary | ICD-10-CM | POA: Insufficient documentation

## 2017-10-08 DIAGNOSIS — E1122 Type 2 diabetes mellitus with diabetic chronic kidney disease: Secondary | ICD-10-CM | POA: Diagnosis not present

## 2017-10-08 DIAGNOSIS — E1151 Type 2 diabetes mellitus with diabetic peripheral angiopathy without gangrene: Secondary | ICD-10-CM | POA: Diagnosis not present

## 2017-10-08 DIAGNOSIS — Z8249 Family history of ischemic heart disease and other diseases of the circulatory system: Secondary | ICD-10-CM | POA: Insufficient documentation

## 2017-10-08 DIAGNOSIS — I252 Old myocardial infarction: Secondary | ICD-10-CM | POA: Diagnosis not present

## 2017-10-08 DIAGNOSIS — N183 Chronic kidney disease, stage 3 (moderate): Secondary | ICD-10-CM | POA: Diagnosis not present

## 2017-10-08 DIAGNOSIS — G4733 Obstructive sleep apnea (adult) (pediatric): Secondary | ICD-10-CM | POA: Diagnosis not present

## 2017-10-08 DIAGNOSIS — Z87891 Personal history of nicotine dependence: Secondary | ICD-10-CM | POA: Diagnosis not present

## 2017-10-08 DIAGNOSIS — Z794 Long term (current) use of insulin: Secondary | ICD-10-CM | POA: Insufficient documentation

## 2017-10-08 DIAGNOSIS — Z7982 Long term (current) use of aspirin: Secondary | ICD-10-CM | POA: Diagnosis not present

## 2017-10-08 DIAGNOSIS — I2582 Chronic total occlusion of coronary artery: Secondary | ICD-10-CM | POA: Insufficient documentation

## 2017-10-08 DIAGNOSIS — E039 Hypothyroidism, unspecified: Secondary | ICD-10-CM | POA: Insufficient documentation

## 2017-10-08 DIAGNOSIS — Z7901 Long term (current) use of anticoagulants: Secondary | ICD-10-CM | POA: Diagnosis not present

## 2017-10-08 HISTORY — PX: RIGHT/LEFT HEART CATH AND CORONARY ANGIOGRAPHY: CATH118266

## 2017-10-08 LAB — POCT I-STAT 3, VENOUS BLOOD GAS (G3P V)
Acid-Base Excess: 1 mmol/L (ref 0.0–2.0)
Bicarbonate: 26.3 mmol/L (ref 20.0–28.0)
O2 Saturation: 65 %
TCO2: 28 mmol/L (ref 22–32)
pCO2, Ven: 46.2 mmHg (ref 44.0–60.0)
pH, Ven: 7.363 (ref 7.250–7.430)
pO2, Ven: 35 mmHg (ref 32.0–45.0)

## 2017-10-08 LAB — POCT I-STAT 3, ART BLOOD GAS (G3+)
Bicarbonate: 24.7 mmol/L (ref 20.0–28.0)
O2 Saturation: 99 %
TCO2: 26 mmol/L (ref 22–32)
pCO2 arterial: 40.2 mmHg (ref 32.0–48.0)
pH, Arterial: 7.397 (ref 7.350–7.450)
pO2, Arterial: 122 mmHg — ABNORMAL HIGH (ref 83.0–108.0)

## 2017-10-08 LAB — GLUCOSE, CAPILLARY
Glucose-Capillary: 89 mg/dL (ref 65–99)
Glucose-Capillary: 90 mg/dL (ref 65–99)

## 2017-10-08 SURGERY — RIGHT/LEFT HEART CATH AND CORONARY ANGIOGRAPHY
Anesthesia: LOCAL

## 2017-10-08 MED ORDER — IOPAMIDOL (ISOVUE-370) INJECTION 76%
INTRAVENOUS | Status: AC
  Filled 2017-10-08: qty 100

## 2017-10-08 MED ORDER — ASPIRIN 81 MG PO CHEW: 81.0000 mg | CHEWABLE_TABLET | ORAL | Status: DC

## 2017-10-08 MED ORDER — IOPAMIDOL (ISOVUE-370) INJECTION 76%
INTRAVENOUS | Status: DC | PRN
  Administered 2017-10-08: 75 mL via INTRA_ARTERIAL

## 2017-10-08 MED ORDER — MIDAZOLAM HCL 2 MG/2ML IJ SOLN
INTRAMUSCULAR | Status: AC
  Filled 2017-10-08: qty 2

## 2017-10-08 MED ORDER — SODIUM CHLORIDE 0.9 % IV SOLN: 250.0000 mL | INTRAVENOUS | Status: DC | PRN

## 2017-10-08 MED ORDER — LIDOCAINE HCL (PF) 1 % IJ SOLN
INTRAMUSCULAR | Status: DC | PRN
  Administered 2017-10-08: 10 mL

## 2017-10-08 MED ORDER — LIDOCAINE HCL (PF) 1 % IJ SOLN
INTRAMUSCULAR | Status: AC
  Filled 2017-10-08: qty 30

## 2017-10-08 MED ORDER — HEPARIN (PORCINE) IN NACL 2-0.9 UNIT/ML-% IJ SOLN
INTRAMUSCULAR | Status: AC | PRN
  Administered 2017-10-08: 1000 mL

## 2017-10-08 MED ORDER — SODIUM CHLORIDE 0.9% FLUSH: 3.0000 mL | Freq: Two times a day (BID) | INTRAVENOUS | Status: DC

## 2017-10-08 MED ORDER — FENTANYL CITRATE (PF) 100 MCG/2ML IJ SOLN
INTRAMUSCULAR | Status: DC | PRN
  Administered 2017-10-08 (×3): 25 ug via INTRAVENOUS

## 2017-10-08 MED ORDER — SODIUM CHLORIDE 0.9% FLUSH: 3.0000 mL | INTRAVENOUS | Status: DC | PRN

## 2017-10-08 MED ORDER — FENTANYL CITRATE (PF) 100 MCG/2ML IJ SOLN
INTRAMUSCULAR | Status: AC
  Filled 2017-10-08: qty 2

## 2017-10-08 MED ORDER — HEPARIN (PORCINE) IN NACL 2-0.9 UNIT/ML-% IJ SOLN
INTRAMUSCULAR | Status: AC
  Filled 2017-10-08: qty 1000

## 2017-10-08 MED ORDER — SODIUM CHLORIDE 0.9 % IV SOLN
INTRAVENOUS | Status: DC
  Administered 2017-10-08: 08:00:00 via INTRAVENOUS

## 2017-10-08 MED ORDER — MIDAZOLAM HCL 2 MG/2ML IJ SOLN
INTRAMUSCULAR | Status: DC | PRN
  Administered 2017-10-08 (×3): 1 mg via INTRAVENOUS

## 2017-10-08 MED ORDER — SODIUM CHLORIDE 0.9 % IV SOLN: INTRAVENOUS | Status: AC

## 2017-10-08 SURGICAL SUPPLY — 19 items
CATH EXPO 5F MPA-1 (CATHETERS) ×2 IMPLANT
CATH INFINITI 5 FR AL2 (CATHETERS) ×2 IMPLANT
CATH INFINITI 5FR AL1 (CATHETERS) ×2 IMPLANT
CATH INFINITI 5FR MULTPACK ANG (CATHETERS) ×2 IMPLANT
CATH SWAN GANZ 7F STRAIGHT (CATHETERS) ×2 IMPLANT
GLIDESHEATH SLEND SS 6F .021 (SHEATH) ×2 IMPLANT
GUIDEWIRE INQWIRE 1.5J.035X260 (WIRE) ×1 IMPLANT
INQWIRE 1.5J .035X260CM (WIRE) ×2
KIT HEART LEFT (KITS) ×2 IMPLANT
KIT MICROINTRODUCER STIFF 5F (SHEATH) ×2 IMPLANT
PACK CARDIAC CATHETERIZATION (CUSTOM PROCEDURE TRAY) ×2 IMPLANT
SHEATH PINNACLE 5F 10CM (SHEATH) ×2 IMPLANT
SHEATH PINNACLE 7F 10CM (SHEATH) ×2 IMPLANT
SYR MEDRAD MARK V 150ML (SYRINGE) ×2 IMPLANT
TRANSDUCER W/STOPCOCK (MISCELLANEOUS) ×2 IMPLANT
TUBING CIL FLEX 10 FLL-RA (TUBING) ×2 IMPLANT
WIRE EMERALD 3MM-J .025X260CM (WIRE) ×2 IMPLANT
WIRE EMERALD 3MM-J .035X150CM (WIRE) ×2 IMPLANT
WIRE EMERALD ST .035X150CM (WIRE) ×2 IMPLANT

## 2017-10-08 NOTE — Discharge Instructions (Signed)
Restart Pradaxa on 10/09/17 if no bleeding from groin cath site  Femoral Site Care Refer to this sheet in the next few weeks. These instructions provide you with information about caring for yourself after your procedure. Your health care provider may also give you more specific instructions. Your treatment has been planned according to current medical practices, but problems sometimes occur. Call your health care provider if you have any problems or questions after your procedure. What can I expect after the procedure? After your procedure, it is typical to have the following:  Bruising at the site that usually fades within 1-2 weeks.  Blood collecting in the tissue (hematoma) that may be painful to the touch. It should usually decrease in size and tenderness within 1-2 weeks.  Follow these instructions at home:  Take medicines only as directed by your health care provider.  You may shower 24-48 hours after the procedure or as directed by your health care provider. Remove the bandage (dressing) and gently wash the site with plain soap and water. Pat the area dry with a clean towel. Do not rub the site, because this may cause bleeding.  Do not take baths, swim, or use a hot tub until your health care provider approves.  Check your insertion site every day for redness, swelling, or drainage.  Do not apply powder or lotion to the site.  Limit use of stairs to twice a day for the first 2-3 days or as directed by your health care provider.  Do not squat for the first 2-3 days or as directed by your health care provider.  Do not lift over 10 lb (4.5 kg) for 5 days after your procedure or as directed by your health care provider.  Ask your health care provider when it is okay to: ? Return to work or school. ? Resume usual physical activities or sports. ? Resume sexual activity.  Do not drive home if you are discharged the same day as the procedure. Have someone else drive you.  You may  drive 24 hours after the procedure unless otherwise instructed by your health care provider.  Do not operate machinery or power tools for 24 hours after the procedure or as directed by your health care provider.  If your procedure was done as an outpatient procedure, which means that you went home the same day as your procedure, a responsible adult should be with you for the first 24 hours after you arrive home.  Keep all follow-up visits as directed by your health care provider. This is important. Contact a health care provider if:  You have a fever.  You have chills.  You have increased bleeding from the site. Hold pressure on the site. Get help right away if:  You have unusual pain at the site.  You have redness, warmth, or swelling at the site.  You have drainage (other than a small amount of blood on the dressing) from the site.  The site is bleeding, and the bleeding does not stop after 30 minutes of holding steady pressure on the site.  Your leg or foot becomes pale, cool, tingly, or numb. This information is not intended to replace advice given to you by your health care provider. Make sure you discuss any questions you have with your health care provider. Document Released: 07/01/2014 Document Revised: 04/04/2016 Document Reviewed: 05/17/2014 Elsevier Interactive Patient Education  [employer].

## 2017-10-08 NOTE — Progress Notes (Addendum)
Site area: RFA / RFV Site Prior to Removal:  Level 0 Pressure Applied For: 20 min Manual:   yes Patient Status During Pull: stable  Post Pull Site:  Level 0 Post Pull Instructions Given: yes  Post Pull Pulses Present: doppler Dressing Applied:  tegaderm Bedrest begins @ 0349 till 1430 Comments: by Wynona Canes / canderson

## 2017-10-08 NOTE — Telephone Encounter (Signed)
Patient has severe AS, underwent cath today which showed occluded prox RCA and 60% ost LAD, planning for TAVR. Although I don't think cataract surgery will interfere with TAVR, need MD review to decide on the timing of cataract surgery. Note, patient is on pradaxa as well.   Hilbert Corrigan PA Pager: [phone]

## 2017-10-08 NOTE — Interval H&P Note (Signed)
History and Physical Interval Note:  10/08/2017 8:02 AM  Steven Maldonado  has presented today for cardiac cath with the diagnosis of CAD, Aortic Stenosis  The various methods of treatment have been discussed with the patient and family. After consideration of risks, benefits and other options for treatment, the patient has consented to  Procedure(s): RIGHT/LEFT HEART CATH AND CORONARY ANGIOGRAPHY (N/A) as a surgical intervention .  The patient's history has been reviewed, patient examined, no change in status, stable for surgery.  I have reviewed the patient's chart and labs.  Questions were answered to the patient's satisfaction.    Cath Lab Visit (complete for each Cath Lab visit)  Clinical Evaluation Leading to the Procedure:   ACS: No.  Non-ACS:    Anginal Classification: CCS III  Anti-ischemic medical therapy: Minimal Therapy (1 class of medications)  Non-Invasive Test Results: No non-invasive testing performed  Prior CABG: No previous CABG         Steven Maldonado

## 2017-10-09 ENCOUNTER — Encounter (HOSPITAL_COMMUNITY): Payer: Self-pay | Admitting: Cardiovascular Disease

## 2017-10-09 ENCOUNTER — Other Ambulatory Visit: Payer: Self-pay

## 2017-10-09 DIAGNOSIS — I350 Nonrheumatic aortic (valve) stenosis: Secondary | ICD-10-CM

## 2017-10-09 DIAGNOSIS — N289 Disorder of kidney and ureter, unspecified: Secondary | ICD-10-CM

## 2017-10-09 LAB — GLUCOSE, CAPILLARY: Glucose-Capillary: 39 mg/dL — CL (ref 65–99)

## 2017-10-09 NOTE — Telephone Encounter (Signed)
Follow up    [facility] called and they need response today on surgical clearance

## 2017-10-09 NOTE — Telephone Encounter (Signed)
I would delay cataract surgery until after his TAVR.   [name]

## 2017-10-10 NOTE — Telephone Encounter (Signed)
Spoke with Steven Maldonado @ [facility] and let her know that Dr. Angelena Form wanted to postpone the pt's cataract sx until after his TAVR. She also had me leave a detailed message for [facility], Surgery Scheduler as well.  Placed a call to the pt to let him know and had to leave a message for him to call back.

## 2017-10-10 NOTE — Telephone Encounter (Signed)
    Chart reviewed as part of pre-operative protocol coverage.   Please let surgical team and patient know Dr. Angelena Form recommends to delay cataract surgery until after his TAVR.  Charlie Pitter, [name]  10/10/2017, 7:58 AM

## 2017-10-10 NOTE — Telephone Encounter (Signed)
pts wife returned my call and she has been made aware that Dr. Angelena Form wanted to postpone the Cataract sx until after his TAVR procedure. She thanked me for the call.

## 2017-10-13 ENCOUNTER — Encounter: Payer: Self-pay | Admitting: Physician Assistant

## 2017-10-13 ENCOUNTER — Ambulatory Visit (INDEPENDENT_AMBULATORY_CARE_PROVIDER_SITE_OTHER): Payer: Medicare Other | Admitting: Physician Assistant

## 2017-10-13 ENCOUNTER — Other Ambulatory Visit: Payer: Self-pay

## 2017-10-13 VITALS — BP 124/60 | HR 62 | Temp 97.7°F | Resp 14 | Wt 252.4 lb

## 2017-10-13 DIAGNOSIS — G4733 Obstructive sleep apnea (adult) (pediatric): Secondary | ICD-10-CM

## 2017-10-13 DIAGNOSIS — I350 Nonrheumatic aortic (valve) stenosis: Secondary | ICD-10-CM

## 2017-10-13 DIAGNOSIS — E038 Other specified hypothyroidism: Secondary | ICD-10-CM

## 2017-10-13 DIAGNOSIS — I4891 Unspecified atrial fibrillation: Secondary | ICD-10-CM | POA: Diagnosis not present

## 2017-10-13 DIAGNOSIS — I2510 Atherosclerotic heart disease of native coronary artery without angina pectoris: Secondary | ICD-10-CM

## 2017-10-13 DIAGNOSIS — Z794 Long term (current) use of insulin: Secondary | ICD-10-CM | POA: Diagnosis not present

## 2017-10-13 DIAGNOSIS — E119 Type 2 diabetes mellitus without complications: Secondary | ICD-10-CM

## 2017-10-13 DIAGNOSIS — I10 Essential (primary) hypertension: Secondary | ICD-10-CM | POA: Diagnosis not present

## 2017-10-13 DIAGNOSIS — I739 Peripheral vascular disease, unspecified: Secondary | ICD-10-CM | POA: Diagnosis not present

## 2017-10-13 DIAGNOSIS — E039 Hypothyroidism, unspecified: Secondary | ICD-10-CM | POA: Diagnosis not present

## 2017-10-13 DIAGNOSIS — E785 Hyperlipidemia, unspecified: Secondary | ICD-10-CM

## 2017-10-13 NOTE — Progress Notes (Signed)
Patient ID: Steven Maldonado MRN: [id number], DOB: 03/20/1941, 76 y.o. Date of Encounter: 10/13/2017, 3:40 PM    Chief Complaint:  Chief Complaint  Patient presents with  . routine follow up     HPI: 76 y.o. year old male here to f/u high Blood Sugar, Uncontrolled Diabetes.   08/21/2016: At his last routine office visit his A1c result came back 11.4.  This was on 08/21/2016.  At that time I told him to increase his Basaglar to 60 units. Also told him to document fasting morning blood sugar readings on a log form and bring this with him to office visit with me in one week.  Today he reports that he did increase the Basaglar to the 60 units.  Today he does bring in blood sugar log sheet for me to review. Fasting morning blood sugar readings have been: 172 179  160  167  140  150 135 129  States that his wife is doing weight watchers and has lost 10 pounds. He asked me if it would be okay for him to eat the same thing she is eating and follow that. Says that would be easier than eating 2 separate meals. However I remember him discussing this with me at the last office visit and I had already encouraged him at that time to eat what she was eating and follow the Weight Watchers diet. Today he also makes mention that he has seen 4 different dietitians in the past and "knows what he is supposed to eat-- it's just a matter of doing it."   today I also discussed with him.snacking and whether he even if he eats meals with her whether he is going to be sneaking other foods in addition.   he states that they have gotten rid of all snack foods and that there are no snack foods in the house. Asked him if he will drive to the store and get some for himself but he says he will not.   asked him if he thinks he needs to have a follow-up visit with me just in a couple weeks to make sure that he is being compliant. He says that he will be compliant and that he needs to do this for him, not for  me. I agreed but just wanted to make sure that he will actually stick with this.  He is going to continue the Basaglar at the current dose of 60 units. He is going to continue to document blood sugars and will call me if he is getting higher readings than where they are.   05/01/2017: Today he reports that he is sorry that he has not been here for his routine visit and knows that he is overdue for visit with me.  Says that he ended up staying in [place] for 4 months house sitting for his nephew. Says that he has one other thing he wanted to make sure to let me now--says that he is in A. fib and HEENT Arty knows that--- says that he recently saw cardiology and their EKG showed that he was in A. Fib. Again today he tells me that he plans to make diet changes. He acts as if he does not even remember the conversation that we had at his last office visit and all that is documented in the note above--today states that he wants to live to be a grandfather that he wants to take better care of himself and that he has gone to  lots of nutritionist and his wife has been doing weight watchers and he knows what to do and says that he is going to start doing it---!!!!!! States that he is taking the insulin as directed. He is also taking metoprolol and Lasix as directed. Having no lightheadedness. Taking Lipitor as directed. No myalgias or right upper quadrant pain.   10/13/2017: Today I reviewed in the computer that he had a cardioversions listed for 05/08/17 and 07/28/17.  Also see that he had left and right heart cath listed for 10/08/17 and documentation of severe AS.  Today in patient's words he states that he has had a lot of doctors appointments. Says he "was supposed to have cataract surgery but cardiology canceled that."   Says that he has 2 more doctor's appointments and then is supposed to have surgery to replace the valve.  Says that he was glad to find out that he does not have to have open heart  surgery that they can go through his groin.  Says that the surgeon "said that he could give him 10-15 years to live by giving him this valve."  Says to me "if he can do that for me, then I can make these diet changes that you all have been wanting me to do ".  Says that cardiology had wanted him to see endocrinology.  Patient says to me today that he would prefer to just see me instead of seeing another new doctor.  Says that he thinks "if he did what I told him to do, that I could take care of his diabetes just as well as anybody. " Says "knows he has to do what he is told or it does not matter who he sees"  Also says that he has his wife and his grandson and he wants to live for them.  Says that he has gone to 5 dietitians and does not think that there is anything that anybody can tell him that he has not heard before.  Says that it was after he met with the surgeon that he had this change of mind and change of attitude.  That visit was on 09/26/17.  States that right then is when he went home and made changes in his diet.  States that his recent morning blood sugars have been 109, 106, 92.  He has no other specific concerns to address today. He reports that his "fluid pill" (lasix) was held for a couple days around the time of the cath but otherwise has been taking metoprolol and Lasix as directed. Taking his Lipitor as directed.  No adverse effects.    Home Meds:   Outpatient Medications Prior to Visit  Medication Sig Dispense Refill  . amiodarone (PACERONE) 200 MG tablet Take 200 mg by mouth daily.    [name] Kitchen aspirin EC 81 MG tablet Take 81 mg by mouth daily.    [name] Kitchen atorvastatin (LIPITOR) 80 MG tablet TAKE 1 TABLET EVERY DAY AT BEDTIME 90 tablet 0  . BD PEN NEEDLE NANO U/F 32G X 4 MM MISC USE 4 (FOUR) TIMES DAILY. 400 each 1  . Blood Glucose Monitoring Suppl (FREESTYLE LITE) DEVI 1 each by Does not apply route 2 (two) times daily. 1 each 0  . Choline Fenofibrate 135 MG capsule TAKE 1 CAPSULE DAILY 90  capsule 3  . docusate sodium (COLACE) 100 MG capsule Take 100 mg daily by mouth.     . fesoterodine (TOVIAZ) 4 MG TB24 tablet Take 4 mg by mouth  daily.    . furosemide (LASIX) 20 MG tablet Take 1 tablet (20 mg total) by mouth daily. 90 tablet 3  . glipiZIDE (GLUCOTROL XL) 10 MG 24 hr tablet TAKE 1 TABLET (10 MG TOTAL) BY MOUTH DAILY WITH BREAKFAST. 30 tablet 5  . glucose blood (FREESTYLE LITE) test strip 1 each by Other route 4 (four) times daily -  before meals and at bedtime. 450 each 3  . Insulin Glargine (BASAGLAR KWIKPEN) 100 UNIT/ML SOPN Inject 0.65 mLs (65 Units total) into the skin at bedtime. 30 mL 0  . insulin lispro (HUMALOG) 100 UNIT/ML KiwkPen Inject 0.05 mLs (5 Units total) into the skin 3 (three) times daily. 15 mL 3  . magnesium oxide (MAG-OX) 400 MG tablet Take 1 tablet (400 mg total) by mouth daily. 30 tablet 11  . metoprolol (LOPRESSOR) 50 MG tablet Take one and one-half tablet by mouth twice a day 90 tablet 6  . MYRBETRIQ 25 MG TB24 tablet TAKE 1 TABLET BY MOUTH EVERY DAY 90 tablet 0  . PRADAXA 150 MG CAPS capsule TAKE 1 CAPSULE EVERY 12    HOURS 180 capsule 3   No facility-administered medications prior to visit.     Allergies: No Known Allergies    Review of Systems: See HPI for pertinent ROS. All other ROS negative.    Physical Exam: Blood pressure 124/60, pulse 62, temperature 97.7 F (36.5 C), temperature source Oral, resp. rate 14, weight 114.5 kg (252 lb 6.4 oz), SpO2 98 %., Body mass index is 35.2 kg/m. General:  Obese WM. Appears in no acute distress. Neck: Supple. No thyromegaly. No lymphadenopathy. Lungs: Clear bilaterally to auscultation without wheezes, rales, or rhonchi. Breathing is unlabored. Heart: Regular rhythm. III/VI murmur. Abdomen: Soft, non-tender, non-distended with normoactive bowel sounds. No hepatomegaly. No rebound/guarding. No obvious abdominal masses. Msk:  Strength and tone normal for age. Extremities/Skin: Warm and dry. Neuro:  Alert and oriented X 3. Moves all extremities spontaneously. Gait is normal. CNII-XII grossly in tact. Psych:  Responds to questions appropriately with a normal affect.     ASSESSMENT AND PLAN:  76 y.o. year old male with   1. Coronary artery disease involving native coronary artery of native heart without angina pectoris 10/13/2017---Managed by Cardiology  2. Aortic valve stenosis, etiology of cardiac valve disease unspecified 10/13/2017---Managed by Cardiology  3. Atrial fibrillation, unspecified type ([place]) 10/13/2017---Managed by Cardiology  4. PAD (peripheral artery disease) ([place]) 10/13/2017---Managed by Cardiology  5. Essential hypertension 10/13/2017--Blood pressure is at goal. Continue current medications. Check lab to monitor. - COMPLETE METABOLIC PANEL WITH GFR  6. OSA on CPAP 10/13/2017--stable, on CPAP  7. Subclinical hypothyroidism 10/13/2017--recheck lab to monitor - TSH - T4, free  8. Type 2 diabetes mellitus without complication, without long-term current use of insulin ([place]) 10/13/2017-- Will check the following labs to monitor.  - COMPLETE METABOLIC PANEL WITH GFR - Lipid panel - Hemoglobin A1c     Signed, [address] [place], [place], [facility] 10/13/2017 3:40 PM

## 2017-10-14 ENCOUNTER — Other Ambulatory Visit: Payer: Medicare Other

## 2017-10-14 ENCOUNTER — Other Ambulatory Visit: Payer: Self-pay

## 2017-10-14 DIAGNOSIS — I350 Nonrheumatic aortic (valve) stenosis: Secondary | ICD-10-CM

## 2017-10-14 DIAGNOSIS — N289 Disorder of kidney and ureter, unspecified: Secondary | ICD-10-CM

## 2017-10-14 LAB — BASIC METABOLIC PANEL
BUN/Creatinine Ratio: 14 (ref 10–24)
BUN: 21 mg/dL (ref 8–27)
CO2: 19 mmol/L — ABNORMAL LOW (ref 20–29)
Calcium: 9.5 mg/dL (ref 8.6–10.2)
Chloride: 103 mmol/L (ref 96–106)
Creatinine, Ser: 1.52 mg/dL — ABNORMAL HIGH (ref 0.76–1.27)
GFR calc Af Amer: 51 mL/min/{1.73_m2} — ABNORMAL LOW (ref >59)
GFR calc non Af Amer: 44 mL/min/{1.73_m2} — ABNORMAL LOW (ref >59)
Glucose: 185 mg/dL — ABNORMAL HIGH (ref 65–99)
Potassium: 4.7 mmol/L (ref 3.5–5.2)
Sodium: 138 mmol/L (ref 134–144)

## 2017-10-14 LAB — TSH: TSH: 44.03 mIU/L — ABNORMAL HIGH (ref 0.40–4.50)

## 2017-10-14 LAB — COMPLETE METABOLIC PANEL WITH GFR
AG Ratio: 1.7 (calc) (ref 1.0–2.5)
ALT: 22 U/L (ref 9–46)
AST: 25 U/L (ref 10–35)
Albumin: 4.3 g/dL (ref 3.6–5.1)
Alkaline phosphatase (APISO): 34 U/L — ABNORMAL LOW (ref 40–115)
BUN/Creatinine Ratio: 16 (calc) (ref 6–22)
BUN: 28 mg/dL — ABNORMAL HIGH (ref 7–25)
CO2: 22 mmol/L (ref 20–32)
Calcium: 9.7 mg/dL (ref 8.6–10.3)
Chloride: 105 mmol/L (ref 98–110)
Creat: 1.75 mg/dL — ABNORMAL HIGH (ref 0.70–1.18)
GFR, Est African American: 43 mL/min/{1.73_m2} — ABNORMAL LOW (ref >=60)
GFR, Est Non African American: 38 mL/min/{1.73_m2} — ABNORMAL LOW (ref >=60)
Globulin: 2.6 g/dL (calc) (ref 1.9–3.7)
Glucose, Bld: 170 mg/dL — ABNORMAL HIGH (ref 65–99)
Potassium: 4.7 mmol/L (ref 3.5–5.3)
Sodium: 137 mmol/L (ref 135–146)
Total Bilirubin: 0.4 mg/dL (ref 0.2–1.2)
Total Protein: 6.9 g/dL (ref 6.1–8.1)

## 2017-10-14 LAB — HEMOGLOBIN A1C
Hgb A1c MFr Bld: 11.2 % of total Hgb — ABNORMAL HIGH (ref <5.7)
Mean Plasma Glucose: 275 (calc)
eAG (mmol/L): 15.2 (calc)

## 2017-10-14 LAB — T4, FREE: Free T4: 0.5 ng/dL — ABNORMAL LOW (ref 0.8–1.8)

## 2017-10-14 LAB — LIPID PANEL
Cholesterol: 155 mg/dL (ref <200)
HDL: 38 mg/dL — ABNORMAL LOW (ref >40)
LDL Cholesterol (Calc): 93 mg/dL (calc)
Non-HDL Cholesterol (Calc): 117 mg/dL (calc) (ref <130)
Total CHOL/HDL Ratio: 4.1 (calc) (ref <5.0)
Triglycerides: 144 mg/dL (ref <150)

## 2017-10-14 MED ORDER — LEVOTHYROXINE SODIUM 25 MCG PO TABS: ORAL_TABLET | ORAL | 1 refills | Status: DC

## 2017-10-15 ENCOUNTER — Other Ambulatory Visit: Payer: Self-pay | Admitting: Physician Assistant

## 2017-10-20 ENCOUNTER — Encounter (HOSPITAL_COMMUNITY): Payer: Medicare Other

## 2017-10-20 ENCOUNTER — Ambulatory Visit (HOSPITAL_COMMUNITY): Payer: Medicare Other

## 2017-10-22 ENCOUNTER — Ambulatory Visit (HOSPITAL_COMMUNITY): Payer: Medicare Other

## 2017-10-31 ENCOUNTER — Ambulatory Visit (HOSPITAL_BASED_OUTPATIENT_CLINIC_OR_DEPARTMENT_OTHER)
Admission: RE | Admit: 2017-10-31 | Discharge: 2017-10-31 | Disposition: A | Discharge status: Home / Self Care | Payer: Medicare Other | Source: Ambulatory Visit | Attending: Cardiovascular Disease | Admitting: Cardiovascular Disease

## 2017-10-31 ENCOUNTER — Ambulatory Visit (HOSPITAL_COMMUNITY)
Admission: RE | Admit: 2017-10-31 | Discharge: 2017-10-31 | Disposition: A | Discharge status: Home / Self Care | Payer: Medicare Other | Source: Ambulatory Visit | Attending: Cardiovascular Disease | Admitting: Cardiovascular Disease

## 2017-10-31 ENCOUNTER — Telehealth: Payer: Self-pay | Admitting: Cardiovascular Disease

## 2017-10-31 ENCOUNTER — Encounter (HOSPITAL_COMMUNITY): Payer: Medicare Other

## 2017-10-31 DIAGNOSIS — I350 Nonrheumatic aortic (valve) stenosis: Secondary | ICD-10-CM | POA: Diagnosis not present

## 2017-10-31 DIAGNOSIS — I700 Atherosclerosis of aorta: Secondary | ICD-10-CM | POA: Insufficient documentation

## 2017-10-31 DIAGNOSIS — N289 Disorder of kidney and ureter, unspecified: Secondary | ICD-10-CM

## 2017-10-31 DIAGNOSIS — I2510 Atherosclerotic heart disease of native coronary artery without angina pectoris: Secondary | ICD-10-CM | POA: Diagnosis not present

## 2017-10-31 LAB — PULMONARY FUNCTION TEST
DL/VA % pred: 105 %
DL/VA: 4.91 ml/min/mmHg/L
DLCO unc % pred: 66 %
DLCO unc: 22.45 ml/min/mmHg
FEF 25-75 Post: 3.5 L/sec
FEF 25-75 Pre: 3.38 L/sec
FEF2575-%Change-Post: 3 %
FEF2575-%Pred-Post: 147 %
FEF2575-%Pred-Pre: 142 %
FEV1-%Change-Post: -4 %
FEV1-%Pred-Post: 73 %
FEV1-%Pred-Pre: 76 %
FEV1-Post: 2.38 L
FEV1-Pre: 2.48 L
FEV1FVC-%Change-Post: -1 %
FEV1FVC-%Pred-Pre: 121 %
FEV6-%Change-Post: -2 %
FEV6-%Pred-Post: 64 %
FEV6-%Pred-Pre: 66 %
FEV6-Post: 2.71 L
FEV6-Pre: 2.78 L
FEV6FVC-%Pred-Post: 106 %
FEV6FVC-%Pred-Pre: 106 %
FVC-%Change-Post: -2 %
FVC-%Pred-Post: 60 %
FVC-%Pred-Pre: 62 %
FVC-Post: 2.71 L
FVC-Pre: 2.78 L
Post FEV1/FVC ratio: 88 %
Post FEV6/FVC ratio: 100 %
Pre FEV1/FVC ratio: 89 %
Pre FEV6/FVC Ratio: 100 %
RV % pred: 88 %
RV: 2.3 L
TLC % pred: 74 %
TLC: 5.39 L

## 2017-10-31 MED ORDER — DEXTROSE 5 % IV SOLN
INTRAVENOUS | Status: AC
  Administered 2017-10-31: 14:00:00 via INTRAVENOUS
  Filled 2017-10-31: qty 500

## 2017-10-31 MED ORDER — IOPAMIDOL (ISOVUE-370) INJECTION 76%
INTRAVENOUS | Status: AC
  Administered 2017-10-31: 100 mL
  Filled 2017-10-31: qty 50

## 2017-10-31 MED ORDER — SODIUM BICARBONATE BOLUS VIA INFUSION
INTRAVENOUS | Status: AC
  Administered 2017-10-31: 75 meq via INTRAVENOUS
  Filled 2017-10-31: qty 1

## 2017-10-31 MED ORDER — IOPAMIDOL (ISOVUE-370) INJECTION 76%
INTRAVENOUS | Status: AC
  Administered 2017-10-31: 30 mL
  Filled 2017-10-31: qty 100

## 2017-10-31 MED ORDER — ALBUTEROL SULFATE (2.5 MG/3ML) 0.083% IN NEBU
2.5000 mg | INHALATION_SOLUTION | Freq: Once | RESPIRATORY_TRACT | Status: AC
  Administered 2017-10-31: 2.5 mg via RESPIRATORY_TRACT

## 2017-10-31 NOTE — Telephone Encounter (Signed)
New message   Steven Maldonado from [facility] -[phone] calling, pt missed their 2pm appt for Full PFT. Patient is getting an infusion now that will last until 4pm. Arbie Cookey wants to know if patient can wait until Monday to have test or does it have to be done today. Please call

## 2017-10-31 NOTE — Progress Notes (Signed)
*  PRELIMINARY RESULTS* Vascular Ultrasound Carotid Duplex (Doppler) has been completed.  Preliminary findings: Right 1-39% ICA stenosis, Left 60-79% proximal ICA stenosis. Large amount of calcified plaque could obscure a higher grade stenosis. Bilateral vertebral arteries appear patent and antegrade.  Everrett Coombe 10/31/2017, 5:48 PM

## 2017-10-31 NOTE — Telephone Encounter (Signed)
Left message to call back  

## 2017-11-01 ENCOUNTER — Other Ambulatory Visit: Payer: Self-pay | Admitting: Physician Assistant

## 2017-11-02 ENCOUNTER — Other Ambulatory Visit: Payer: Self-pay | Admitting: Physician Assistant

## 2017-11-03 ENCOUNTER — Other Ambulatory Visit: Payer: Self-pay | Admitting: Cardiovascular Disease

## 2017-11-03 MED ORDER — METOPROLOL TARTRATE 50 MG PO TABS: ORAL_TABLET | ORAL | 3 refills | Status: DC

## 2017-11-03 NOTE — Telephone Encounter (Signed)
rx filled per protocol  

## 2017-11-03 NOTE — Telephone Encounter (Signed)
PFT was completed on 10/31/17.

## 2017-11-06 ENCOUNTER — Other Ambulatory Visit: Payer: Self-pay | Admitting: Cardiovascular Disease

## 2017-11-06 MED ORDER — AMIODARONE HCL 200 MG PO TABS: 200.0000 mg | ORAL_TABLET | Freq: Every day | ORAL | 3 refills | Status: DC

## 2017-11-12 ENCOUNTER — Institutional Professional Consult (permissible substitution) (INDEPENDENT_AMBULATORY_CARE_PROVIDER_SITE_OTHER): Payer: Medicare Other | Admitting: Thoracic Surgery (Cardiothoracic Vascular Surgery)

## 2017-11-12 ENCOUNTER — Encounter: Payer: Self-pay | Admitting: Thoracic Surgery (Cardiothoracic Vascular Surgery)

## 2017-11-12 ENCOUNTER — Encounter: Payer: Medicare Other | Admitting: Thoracic Surgery (Cardiothoracic Vascular Surgery)

## 2017-11-12 VITALS — BP 155/72 | HR 80 | Resp 20 | Ht 71.0 in | Wt 249.0 lb

## 2017-11-12 DIAGNOSIS — I350 Nonrheumatic aortic (valve) stenosis: Secondary | ICD-10-CM

## 2017-11-12 DIAGNOSIS — I2510 Atherosclerotic heart disease of native coronary artery without angina pectoris: Secondary | ICD-10-CM

## 2017-11-12 DIAGNOSIS — I481 Persistent atrial fibrillation: Secondary | ICD-10-CM

## 2017-11-12 DIAGNOSIS — I4819 Other persistent atrial fibrillation: Secondary | ICD-10-CM

## 2017-11-12 NOTE — Patient Instructions (Signed)
Stop taking Pradaxa on 11/28/2017  Continue taking all other medications without change through the day before surgery.  Have nothing to eat or drink after midnight the night before surgery.  On the morning of surgery take only Synthroid with a sip of water.

## 2017-11-12 NOTE — H&P (View-Only) (Signed)
[facility]  MULTIDISCIPLINARY HEART VALVE CLINIC  CARDIOTHORACIC SURGERY CONSULTATION REPORT  Referring Provider is Burnell Blanks, MD PCP is [name], [name]  Chief Complaint  Patient presents with  . 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  The patient's cardiac history dates back to 2012 when he suffered an acute myocardial infarction.  At the time he was treated at [facility] where catheterization reportedly revealed occluded right coronary artery with collaterals.  Medical therapy was recommended.  The patient developed paroxysmal atrial fibrillation and has been followed intermittently ever since on long-term anticoagulation using Pradaxa.  He was noted to have a heart murmur on physical exam and echocardiograms revealed aortic stenosis that has gradually progressed in severity.  For the last several years the patient has been followed by Dr. Angelena Form for management of aortic stenosis, coronary artery disease, and atrial fibrillation.  He has been followed by Dr. Fletcher Anon in the [facility] clinic.  Last spring he was noted to be back in atrial fibrillation and he was referred to the atrial fibrillation clinic.  He was loaded with amiodarone he has been maintaining sinus rhythm since he underwent repeat  cardioversion performed September 2018.  He was seen in follow-up recently by Dr. Angelena Form where he reported slow progression of symptoms of exertional shortness of breath and fatigue despite the fact that he was maintaining sinus rhythm.   Most recent follow-up transthoracic echocardiogram performed June 16, 2017 suggested significant progression in the severity of aortic stenosis.  The patient was in atrial fibrillation at the time of the echocardiogram, and peak velocity across the aortic valve range between different length cardiac cycles.  Peak velocity was reported 3.3 m/s corresponding to mean transvalvular gradient estimated 26 mmHg, but the DVI was quite low at 0.19.  The aortic valve area calculated only 0.8 cm.  Left ventricular function remained stable with ejection fraction estimated 50-55%.  The patient subsequently underwent left and right heart catheterization on October 08, 2017 by Dr. Angelena Form.  Catheterization revealed the presence of severe aortic stenosis with peak to peak and mean transvalvular gradients measured 44 and 41.8 mmHg, respectively, corresponding to aortic valve area calculated 0.79 cm.  There was moderate 60% proximal stenosis of the left anterior descending coronary artery that was felt not to be flow limiting.  There was 100% chronic occlusion of the right coronary artery.  There was otherwise nonobstructive coronary artery disease.  Pulmonary artery pressures were mildly elevated.  Patient subsequently underwent CT angiography and has been referred for surgical consultation.  The patient is married and lives locally in [place] with his wife.  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  He has remained reasonably active physically, although he states that he is currently limited both by exertional shortness of breath and by chronic pain in both feet related to diabetic neuropathy.  He states  that over the past 6-7 months he has developed worsening exertional shortness of breath.  Symptoms were much worse when he is out of rhythm, but he still gets short of breath with moderate level activity.  He denies any resting shortness of breath, PND, orthopnea, or lower extremity edema.  He has not had any chest pain or chest tightness either with activity or at rest.  Past Medical History:  Diagnosis Date  . Aortic stenosis    severe  . Cataract associated with type 2 diabetes mellitus ([place]) 10/06/2013  . CKD (chronic kidney disease), stage III ([place])   . Coronary artery disease    a. Cath February 2012 in [facility], occluded RCA with collaterals  . Diabetic ulcer of heel ([place]) 07/14/2014  . DM type 2 (diabetes mellitus, type 2) ([place])   . Essential hypertension   . Hyperkalemia   . Hyperlipidemia   . NSTEMI (non-ST elevated myocardial infarction) ([place]) 12/2010  . Obstructive sleep apnea    2012  . Persistent atrial fibrillation ([place]) 2012   multiple prior cardioversions  . PVD (peripheral vascular disease) ([place])    a. s/p R popliteal artery stenosis tx with drug-coated balloon 05/2014, followed by Dr. Fletcher Anon.  . Skin cancer    "cut off back of neck X 2; off left upper arm; right wrist, near right shoulder blade; several burned off other areas of my body" (06/08/2014)  . Sleep apnea with use of continuous positive airway pressure (CPAP)    04-11-11 AHI was 32.9 and titrated to 15 cm H20, DME is [facility]  . Subclinical hypothyroidism     Past Surgical History:  Procedure Laterality Date  . ABDOMINAL ANGIOGRAM N/A 06/08/2014   Procedure: ABDOMINAL ANGIOGRAM;  Surgeon: Wellington Hampshire, MD;  Location: [facility] CATH LAB;  Service: Cardiovascular;  Laterality: N/A;  . APPENDECTOMY  1965  . CARDIAC CATHETERIZATION  12/2010  . CARDIOVERSION  07/2011  . CARDIOVERSION N/A 04/18/2014   Procedure: CARDIOVERSION;  Surgeon: Dorothy Spark, MD;  Location: [place];  Service: Cardiovascular;  Laterality:  N/A;  . CARDIOVERSION N/A 11/03/2015   Procedure: CARDIOVERSION;  Surgeon: Lelon Perla, MD;  Location: [facility] ENDOSCOPY;  Service: Cardiovascular;  Laterality: N/A;  . CARDIOVERSION N/A 05/08/2017   Procedure: CARDIOVERSION;  Surgeon: Dorothy Spark, MD;  Location: [facility] ENDOSCOPY;  Service: Cardiovascular;  Laterality: N/A;  . CARDIOVERSION N/A 07/28/2017   Procedure: CARDIOVERSION;  Surgeon: Dorothy Spark, MD;  Location: [place];  Service: Cardiovascular;  Laterality: N/A;  . LOWER EXTREMITY ANGIOGRAM N/A 06/08/2014   Procedure: LOWER EXTREMITY ANGIOGRAM;  Surgeon: Wellington Hampshire, MD;  Location: [facility] CATH LAB;  Service: Cardiovascular;  Laterality: N/A;  . POPLITEAL ARTERY ANGIOPLASTY Right 06/08/2014   Archie Endo 06/08/2014  . RIGHT/LEFT HEART CATH AND CORONARY ANGIOGRAPHY N/A 10/08/2017   Procedure: RIGHT/LEFT HEART CATH AND CORONARY ANGIOGRAPHY;  Surgeon: Burnell Blanks, MD;  Location: [place] CV LAB;  Service: Cardiovascular;  Laterality: N/A;  . SKIN CANCER EXCISION Bilateral    "have had them cut off back of neck X 2; off left upper arm; right wrist, near right shoulder blade" (06/08/2014)    Family History  Problem Relation Age of Onset  . Diabetes Mother   . Heart attack Mother   . Hypertension Mother   . Heart attack Father   . Heart failure Father   . Hypertension Father   . Diabetes Father   . Diabetes Sister   . Diabetes Brother   . Diabetes  Other   . Diabetes Daughter        TYPE ll  . Heart Problems Daughter   . Hypertension Sister   . Hypertension Brother   . Stroke Brother     Social History   Socioeconomic History  . Marital status: Married    Spouse name: [name]  . Number of children: 1  . Years of education: 80  . Highest education level: Not on file  Social Needs  . Financial resource strain: Not on file  . Food insecurity - worry: Not on file  . Food insecurity - inability: Not on file  . Transportation needs - medical: Not on  file  . Transportation needs - non-medical: Not on file  Occupational History  . Occupation: retired    Fish farm manager: [employer]  Tobacco Use  . Smoking status: Former Smoker    Packs/day: 2.00    Years: 14.00    Pack years: 28.00    Types: Cigarettes    Last attempt to quit: 11/11/1972    Years since quitting: 45.0  . Smokeless tobacco: Never Used  Substance and Sexual Activity  . Alcohol use: No    Comment: quit in 1984  . Drug use: No  . Sexual activity: Not Currently  Other Topics Concern  . 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   Patient has one child and his wife has one child.   Patient is retired.   Patient has a high school education.   Patient is right-handed.   Patient drinks very little caffeine.    Current Outpatient Medications  Medication Sig Dispense Refill  . amiodarone (PACERONE) 200 MG tablet Take 1 tablet (200 mg total) by mouth daily. 90 tablet 3  . aspirin EC 81 MG tablet Take 81 mg by mouth daily.    [name] Kitchen atorvastatin (LIPITOR) 80 MG tablet TAKE 1 TABLET EVERY DAY AT BEDTIME 90 tablet 0  . BD PEN NEEDLE NANO U/F 32G X 4 MM MISC USE 4 (FOUR) TIMES DAILY. 400 each 1  . Blood Glucose Monitoring Suppl (FREESTYLE LITE) DEVI 1 each by Does not apply route 2 (two) times daily. 1 each 0  . Choline Fenofibrate 135 MG capsule TAKE 1 CAPSULE DAILY 90 capsule 3  . docusate sodium (COLACE) 100 MG capsule Take 100 mg daily by mouth.     . fesoterodine (TOVIAZ) 4 MG TB24 tablet Take 4 mg by mouth daily.    . furosemide (LASIX) 20 MG tablet Take 1 tablet (20 mg total) by mouth daily. 90 tablet 3  . glipiZIDE (GLUCOTROL XL) 10 MG 24 hr tablet TAKE 1 TABLET (10 MG TOTAL) BY MOUTH DAILY WITH BREAKFAST. 30 tablet 5  . glucose blood (FREESTYLE LITE) test strip 1 each by Other route 4 (four) times daily -  before meals and at bedtime. 450 each 3  . Insulin Glargine (BASAGLAR KWIKPEN) 100 UNIT/ML SOPN Inject 0.65  mLs (65 Units total) into the skin at bedtime. 30 mL 0  . insulin lispro (HUMALOG) 100 UNIT/ML KiwkPen Inject 0.05 mLs (5 Units total) into the skin 3 (three) times daily. 15 mL 3  . levothyroxine (SYNTHROID) 25 MCG tablet Take 1 tablet by mouth daily for 3 weeks. Then increase to 2 tablets daily 60 tablet 1  . magnesium oxide (MAG-OX) 400 MG tablet Take 1 tablet (400 mg total) by mouth daily. 30 tablet 11  . magnesium oxide (MAG-OX) 400  MG tablet TAKE 1 TABLET (400 MG TOTAL) BY MOUTH DAILY. 30 tablet 11  . metoprolol tartrate (LOPRESSOR) 50 MG tablet Take one and one-half tablet by mouth twice a day 270 tablet 3  . MYRBETRIQ 25 MG TB24 tablet TAKE 1 TABLET BY MOUTH EVERY DAY 90 tablet 0  . PRADAXA 150 MG CAPS capsule TAKE 1 CAPSULE EVERY 12    HOURS 180 capsule 3   No current facility-administered medications for this visit.     No Known Allergies    Review of Systems:   General:  normal appetite, decreased energy, no weight gain, no weight loss, no fever  Cardiac:  no chest pain with exertion, no chest pain at rest, + SOB with exertion, no resting SOB, no PND, no orthopnea, no palpitations, + arrhythmia, + atrial fibrillation, NO LE edema, NO dizzy spells, NO syncope  Respiratory:  + exertional shortness of breath, no home oxygen, no productive cough, no dry cough, no bronchitis, no wheezing, no hemoptysis, no asthma, no pain with inspiration or cough, + sleep apnea, + CPAP at night  GI:   no difficulty swallowing, no reflux, no frequent heartburn, no hiatal hernia, no abdominal pain, occasional constipation, no diarrhea, no hematochezia, no hematemesis, no melena  GU:   no dysuria,  no frequency, no urinary tract infection, no hematuria, no enlarged prostate, no kidney stones, + kidney disease  Vascular:  no pain suggestive of claudication, + chronic pain in feet, no leg cramps, no varicose veins, no DVT, no non-healing foot ulcer  Neuro:   no stroke, no TIA's, no seizures, no headaches,  no temporary blindness one eye,  no slurred speech, + peripheral neuropathy, + chronic pain, no instability of gait, no memory/cognitive dysfunction  Musculoskeletal: no arthritis, no joint swelling, no myalgias, mild difficulty walking, normal mobility   Skin:   no rash, no itching, no skin infections, no pressure sores or ulcerations  Psych:   no anxiety, no depression, no nervousness, no unusual recent stress  Eyes:   + blurry vision, no floaters, + recent vision changes - needs cataract surgery, + wears glasses or contacts  ENT:   no hearing loss, no loose or painful teeth, edentulous with full dentures, last saw dentist many years ago  Hematologic:  no easy bruising, no abnormal bleeding, no clotting disorder, no frequent epistaxis  Endocrine:  + diabetes, does check CBG's at home           Physical Exam:   Ht 5\' 11"  (1.803 m)   Wt 249 lb (112.9 kg)   BMI 34.73 kg/m   General:  Moderately obese,  well-appearing  HEENT:  Unremarkable   Neck:   no JVD, no bruits, no adenopathy   Chest:   clear to auscultation, symmetrical breath sounds, no wheezes, no rhonchi   CV:   RRR, grade III/VI crescendo/decrescendo murmur heard best at RUSB,  no diastolic murmur  Abdomen:  soft, non-tender, no masses   Extremities:  warm, well-perfused, pulses diminished but palpable, no LE edema  Rectal/GU  Deferred  Neuro:   Grossly non-focal and symmetrical throughout  Skin:   Clean and dry, no rashes, no breakdown   Diagnostic Tests:  Transthoracic Echocardiography  Patient:    Alyx, Gee MR #:       [id number] Study Date: 06/16/2017 Gender:     M Age:        60 Height:     180.3 cm Weight:     114.3 kg BSA:  2.43 m^2 Pt. Status: Room:   SONOGRAPHER  Wyatt Mage, RDCS  ATTENDING    Sonny Dandy, Christopher  REFERRING    McAlhany, Christopher  PERFORMING   [facility],  Outpatient  cc:  ------------------------------------------------------------------- LV EF: 50% -   55%  ------------------------------------------------------------------- Indications:      Aortic stenosis (I35).  ------------------------------------------------------------------- History:   PMH:   Dyspnea.  Atrial fibrillation.  Coronary artery disease.  Aortic valve disease.  Risk factors:  OSA on CPAP. CKD. Former tobacco use. Hypertension. Diabetes mellitus. Dyslipidemia.   ------------------------------------------------------------------- Study Conclusions  - Procedure narrative: Transthoracic echocardiography. Image   quality was suboptimal. The study was technically difficult, as a   result of poor acoustic windows, poor sound wave transmission,   and body habitus. Intravenous contrast (Definity) was   administered. - Left ventricle: The cavity size was normal. Wall thickness was   normal. Systolic function was normal. The estimated ejection   fraction was in the range of 50% to 55%. The study is not   technically sufficient to allow evaluation of LV diastolic   function. - Aortic valve: Heavily calcified aortic valve with severe   stenosis. Mean gradient (S): 26 mm Hg. Peak gradient (S): 45 mm   Hg. Valve area (VTI): 0.81 cm^2. Valve area (Vmax): 0.92 cm^2.   Valve area (Vmean): 0.81 cm^2. - Mitral valve: Calcified annulus. Mildly thickened leaflets . - Left atrium: Moderately dilated.  Impressions:  - Technically difficult study. LVEF lower at 50-55%. A-fib with RVR   is present. There is likely severe aortic stenosis, however, the   mean aortic valve gradient is lower in this study compared to the   prior study, but calculated AVA is still around 0.9 cm2 based on   an LVOT diameter of 2.3 cm.  ------------------------------------------------------------------- Labs, prior tests, procedures, and surgery: Transthoracic echocardiography (11/19/2016).     The aortic valve showed moderate to severe stenosis.  EF was 60%. Aortic valve: peak gradient of 60 mm Hg and mean gradient of 32 mm Hg.  ------------------------------------------------------------------- Study data:  Comparison was made to the study of 11/19/2016.  Study status:  Routine.  Procedure:  The patient reported no pain pre or post test. Transthoracic echocardiography. Image quality was suboptimal. The study was technically difficult, as a result of poor acoustic windows, poor sound wave transmission, and body habitus. Intravenous contrast (Definity) was administered.  Study completion:  There were no complications.          Transthoracic echocardiography.  M-mode, complete 2D, spectral Doppler, and color Doppler.  Birthdate:  Patient birthdate: [birth year].  Age:  Patient is 77 yr old.  Sex:  Gender: male.    BMI: 35.2 kg/m^2.  Blood pressure:     132/82  Patient status:  Outpatient.  Study date: Study date: 06/16/2017. Study time: 01:47 PM.  Location:  [facility]  -------------------------------------------------------------------  ------------------------------------------------------------------- Left ventricle:  The cavity size was normal. Wall thickness was normal. Systolic function was normal. The estimated ejection fraction was in the range of 50% to 55%. The study is not technically sufficient to allow evaluation of LV diastolic function.  ------------------------------------------------------------------- Aortic valve:  Heavily calcified aortic valve with severe stenosis.  Doppler:     VTI ratio of LVOT to aortic valve: 0.19. Valve area (VTI): 0.81 cm^2. Indexed valve area (VTI): 0.33 cm^2/m^2. Peak velocity ratio of LVOT to aortic valve: 0.22. Valve area (Vmax): 0.92 cm^2. Indexed valve area (Vmax): 0.38 cm^2/m^2.  Mean velocity ratio of LVOT to aortic valve: 0.2. Valve area (Vmean): 0.81 cm^2. Indexed valve area (Vmean): 0.33 cm^2/m^2.    Mean  gradient (S): 26 mm Hg. Peak gradient (S): 45 mm Hg.  ------------------------------------------------------------------- Aorta:  Aortic root: The aortic root was normal in size. Ascending aorta: The ascending aorta was normal in size.  ------------------------------------------------------------------- Mitral valve:   Calcified annulus. Mildly thickened leaflets . Doppler:  There was trivial regurgitation.  ------------------------------------------------------------------- Left atrium:  Moderately dilated.  ------------------------------------------------------------------- Right ventricle:  Poorly visualized.  ------------------------------------------------------------------- Pulmonic valve:    The valve appears to be grossly normal. Doppler:  There was trivial regurgitation.  ------------------------------------------------------------------- Pulmonary artery:   The main pulmonary artery was normal-sized.  ------------------------------------------------------------------- Right atrium:  The atrium was normal in size.  ------------------------------------------------------------------- Pericardium:  There was no pericardial effusion.  ------------------------------------------------------------------- Systemic veins: Inferior vena cava: The vessel was normal in size. The respirophasic diameter changes were in the normal range (>= 50%), consistent with normal central venous pressure.  ------------------------------------------------------------------- Measurements   Left ventricle                            Value          Reference  LV ID, ED, PLAX chordal           (H)     54.5  mm       43 - 52  LV ID, ES, PLAX chordal           (H)     43    mm       23 - 38  LV fx shortening, PLAX chordal    (L)     21    %        >=29  LV PW thickness, ED                       10.9  mm       ---------  IVS/LV PW ratio, ED                       0.95           <=1.3   Stroke volume, 2D                         49    ml       ---------  Stroke volume/bsa, 2D                     20    ml/m^2   ---------    Ventricular septum                        Value          Reference  IVS thickness, ED                         10.4  mm       ---------    LVOT                                      Value          Reference  LVOT ID, S  23    mm       ---------  LVOT area                                 4.15  cm^2     ---------  LVOT peak velocity, S                     73.6  cm/s     ---------  LVOT mean velocity, S                     45.7  cm/s     ---------  LVOT VTI, S                               12.9  cm       ---------  Stroke volume (SV), LVOT DP               53.6  ml       ---------  Stroke index (SV/bsa), LVOT DP            22    ml/m^2   ---------    Aortic valve                              Value          Reference  Aortic valve peak velocity, S             334   cm/s     ---------  Aortic valve mean velocity, S             234   cm/s     ---------  Aortic valve VTI, S                       66.2  cm       ---------  Aortic mean gradient, S                   26    mm Hg    ---------  Aortic peak gradient, S                   45    mm Hg    ---------  VTI ratio, LVOT/AV                        0.19           ---------  Aortic valve area, VTI                    0.81  cm^2     ---------  Aortic valve area/bsa, VTI                0.33  cm^2/m^2 ---------  Velocity ratio, peak, LVOT/AV             0.22           ---------  Aortic valve area, peak velocity          0.92  cm^2     ---------  Aortic valve area/bsa, peak               0.38  cm^2/m^2 ---------  velocity  Velocity ratio, mean, LVOT/AV  0.2            ---------  Aortic valve area, mean velocity          0.81  cm^2     ---------  Aortic valve area/bsa, mean               0.33  cm^2/m^2 ---------  velocity    Aorta                                     Value           Reference  Aortic root ID, ED                        37    mm       ---------  Ascending aorta ID, A-P, S                31    mm       ---------    Left atrium                               Value          Reference  LA ID, A-P, ES                            44    mm       ---------  LA ID/bsa, A-P                            1.81  cm/m^2   <=2.2  LA volume, S                              90.6  ml       ---------  LA volume/bsa, S                          37.2  ml/m^2   ---------  LA volume, ES, 1-p A4C                    67.2  ml       ---------  LA volume/bsa, ES, 1-p A4C                27.6  ml/m^2   ---------  LA volume, ES, 1-p A2C                    108   ml       ---------  LA volume/bsa, ES, 1-p A2C                44.4  ml/m^2   ---------    Systemic veins                            Value          Reference  Estimated CVP                             3     mm Hg    ---------  Legend: (L)  and  (H)  mark values outside specified reference range.  ------------------------------------------------------------------- Prepared and Electronically Authenticated by  Lyman Bishop MD 2018-08-06T17:51:42     RIGHT/LEFT HEART CATH AND CORONARY ANGIOGRAPHY  Conclusion     Prox RCA lesion is 100% stenosed.  Ost LAD to Prox LAD lesion is 60% stenosed.  There is severe aortic valve stenosis.  Hemodynamic findings consistent with mild pulmonary hypertension.   1. Chronic total occlusion of the RCA. The mid and distal RCA fills from left to right collaterals.  2. Moderate stenosis proximal LAD. This does not appear to be flow limiting.  3. Severe aortic valve stenosis (mean gradient 41.8 mmHg, peak to peak gradient 44 mmHg, AVA 0.79 cm2)  Recommendations: Will continue workup for TAVR.    Indications   Severe aortic stenosis [I35.0 (ICD-10-CM)]  Procedural Details/Technique   Technical Details Indication: 77 yo male with h/o CAD, DM, HTN and severe AS here today for  cardiac cath in workup for TAVR  Procedure: The risks, benefits, complications, treatment options, and expected outcomes were discussed with the patient. The patient and/or family concurred with the proposed plan, giving informed consent. The patient was brought to the cath lab after IV hydration was given. The patient was further sedated with Versed and Fentanyl. The right groin was prepped and draped in the usual manner. Using the modified Seldinger access technique, a 5 French sheath was placed in the right femoral artery. A 7 French sheath was placed in the right femoral vein. A right heart catheterization was performed with a balloon tipped catheter. Standard diagnostic catheters were used to perform selective coronary angiography. I crossed the aortic valve with an AL-1 and a straight wire. LV pressures measured.   There were no immediate complications. The patient was taken to the recovery area in stable condition.    Estimated blood loss <50 mL.  During this procedure the patient was administered the following to achieve and maintain moderate conscious sedation: Versed 3 mg, Fentanyl 75 mcg, while the patient's heart rate, blood pressure, and oxygen saturation were continuously monitored. The period of conscious sedation was 60 minutes, of which I was present face-to-face 100% of this time.  Complications   Complications documented before study signed (10/08/2017 10:05 AM EST)    RIGHT/LEFT HEART CATH AND CORONARY ANGIOGRAPHY   None Documented by Burnell Blanks, MD 10/08/2017 10:03 AM EST  Time Range: Intra-procedure      Coronary Findings   Diagnostic  Dominance: Right  Left Anterior Descending  Ost LAD to Prox LAD lesion 60% stenosed  Ost LAD to Prox LAD lesion is 60% stenosed.  First Diagonal Branch  Vessel is moderate in size.  Second Diagonal Branch  Vessel is small in size.  Left Circumflex  Second Obtuse Marginal Branch  Vessel is large in size.  Right  Coronary Artery  Prox RCA lesion 100% stenosed  Prox RCA lesion is 100% stenosed. The lesion is chronically occluded.  Right Posterior Atrioventricular Branch  Collaterals  Post Atrio filled by collaterals from Dist Cx.    Intervention   No interventions have been documented.  Right Heart   Right Heart Pressures Hemodynamic findings consistent with mild pulmonary hypertension. Elevated LV EDP consistent with volume overload.  Left Heart   Aortic Valve There is severe aortic valve stenosis. The aortic valve is calcified.  Coronary Diagrams   Diagnostic Diagram       Implants     No implant documentation for this case.  MERGE Images   Show images for CARDIAC CATHETERIZATION   Link to Procedure Log   Procedure Log    Hemo Data    Most Recent Value  Fick Cardiac Output 5.22 L/min  Fick Cardiac Output Index 2.23 (L/min)/BSA  Aortic Mean Gradient 41.8 mmHg  Aortic Peak Gradient 44 mmHg  Aortic Valve Area 0.79  Aortic Value Area Index 0.34 cm2/BSA  RA A Wave 14 mmHg  RA V Wave 12 mmHg  RA Mean 10 mmHg  RV Systolic Pressure 54 mmHg  RV Diastolic Pressure 7 mmHg  RV EDP 14 mmHg  PA Systolic Pressure 45 mmHg  PA Diastolic Pressure 17 mmHg  PA Mean 30 mmHg  PW A Wave 25 mmHg  PW V Wave 24 mmHg  PW Mean 22 mmHg  AO Systolic Pressure 509 mmHg  AO Diastolic Pressure 52 mmHg  AO Mean 75 mmHg  LV Systolic Pressure 326 mmHg  LV Diastolic Pressure 12 mmHg  LV EDP 25 mmHg  Arterial Occlusion Pressure Extended Systolic Pressure 712 mmHg  Arterial Occlusion Pressure Extended Diastolic Pressure 62 mmHg  Arterial Occlusion Pressure Extended Mean Pressure 94 mmHg  Left Ventricular Apex Extended Systolic Pressure 458 mmHg  Left Ventricular Apex Extended Diastolic Pressure 12 mmHg  Left Ventricular Apex Extended EDP Pressure 29 mmHg  QP/QS 1  TPVR Index 13.45 HRUI  TSVR Index 33.64 HRUI  PVR SVR Ratio 0.12  TPVR/TSVR Ratio 0.4  Order-Level Documents:    Cardiac TAVR  CT  TECHNIQUE: The patient was scanned on a [employer]. A 120 kV retrospective scan was triggered in the descending thoracic aorta at 111 HU's. Gantry rotation speed was 250 msecs and collimation was .6 mm. No beta blockade or nitro were given. The 3D data set was reconstructed in 5% intervals of the R-R cycle. Systolic and diastolic phases were analyzed on a dedicated work station using MPR, MIP and VRT modes. The patient received 80 cc of contrast.  FINDINGS: Aortic Valve: Trileaflet, moderately thickened and calcified aortic valve with severely restricted leaflet opening. Mild calcifications are extending into the LVOT under the left coronary cusp.  Aorta: Normal caliber, moderate diffuse calcifications, no dissection.  Sinotubular Junction:  31 x 30 mm  Ascending Thoracic Aorta:  32 x 32 mm  Aortic Arch:  26 x 26 mm  Descending Thoracic Aorta:  24 x 23 mm  Sinus of Valsalva Measurements:  Non-coronary:  32 mm  Right -coronary:  35 mm  Left -coronary:  36 mm  Coronary Artery Height above Annulus:  Left Main:  17 mm  Right Coronary:  19 mm  Virtual Basal Annulus Measurements:  Maximum/Minimum Diameter:  29.1 x 23.9 mm  Mean Diameter:  26 mm  Perimeter:  83 mm  Area:  530 mm2  Optimum Fluoroscopic Angle for Delivery:  RAO 2 CRA 2  IMPRESSION: 1. Trileaflet, moderately thickened and calcified aortic valve with severely restricted leaflet opening. Mild calcifications are extending into the LVOT under the left coronary cusp. Annular measurements suitable for delivery of a 26 mm Edwards-SAPIEN 3 valve.  2. Sufficient coronary to annulus distance.  3. Optimum Fluoroscopic Angle for Delivery: RAO 2 CRA 2  4. No thrombus in the left atrial appendage.   Electronically Signed   By: Ena Dawley   On: 11/11/2017 16:32    CT ANGIOGRAPHY CHEST, ABDOMEN AND PELVIS  TECHNIQUE: Multidetector CT imaging through  the chest, abdomen and pelvis was performed using the standard protocol during bolus administration of intravenous  contrast. Multiplanar reconstructed images and MIPs were obtained and reviewed to evaluate the vascular anatomy.  CONTRAST:  166mL ISOVUE-370 IOPAMIDOL (ISOVUE-370) INJECTION 76%, 3mL ISOVUE-370 IOPAMIDOL (ISOVUE-370) INJECTION 76%  COMPARISON:  No priors.  FINDINGS: CTA CHEST FINDINGS  Cardiovascular: Heart size is normal. There is no significant pericardial fluid, thickening or pericardial calcification. There is aortic atherosclerosis, as well as atherosclerosis of the great vessels of the mediastinum and the coronary arteries, including calcified atherosclerotic plaque in the left main, left anterior descending and right coronary arteries. Severe thickening calcification of the aortic valve. Severe calcifications of the mitral annulus.  Mediastinum/Lymph Nodes: Multiple prominent borderline enlarged mediastinal and bilateral hilar lymph nodes are noted measuring up to 11 mm in short axis in the right hilar region, nonspecific. Esophagus is unremarkable in appearance. No axillary lymphadenopathy.  Lungs/Pleura: 5 mm pulmonary nodule in the left lower lobe (axial image 72 of series 15). No larger more suspicious appearing pulmonary nodules or masses are noted. No acute consolidative airspace disease. No pleural effusions.  Musculoskeletal/Soft Tissues: There are no aggressive appearing lytic or blastic lesions noted in the visualized portions of the skeleton.  CTA ABDOMEN AND PELVIS FINDINGS  Hepatobiliary: No suspicious cystic or solid hepatic lesions. No intra or extrahepatic biliary ductal dilatation. Gallbladder is normal in appearance.  Pancreas: No pancreatic mass. No pancreatic ductal dilatation. No pancreatic or peripancreatic fluid or inflammatory changes.  Spleen: Unremarkable.  Adrenals/Urinary Tract: 1.6 cm low-attenuation lesion  in the interpolar region of the left kidney is compatible with a simple cyst. Right kidney and bilateral adrenal glands are normal in appearance. No hydroureteronephrosis. Urinary bladder is normal in appearance.  Stomach/Bowel: The appearance of the stomach is normal. There is no pathologic dilatation of small bowel or colon. The appendix is not confidently identified and may be surgically absent. Regardless, there are no inflammatory changes noted adjacent to the cecum to suggest the presence of an acute appendicitis at this time.  Vascular/Lymphatic: Aortic atherosclerosis with vascular findings and measurements pertinent to potential TAVR procedure, as detailed below. No aneurysm or dissection noted in the abdominal or pelvic vasculature. Celiac axis and superior mesenteric artery are widely patent without hemodynamically significant stenosis. Mild narrowing at the ostium of the inferior mesenteric artery, of questionable hemodynamic significance. Bilateral renal artery is are both widely patent without hemodynamically significant stenosis. No lymphadenopathy identified in the abdomen or pelvis.  Reproductive: Prostate gland is enlarged and heterogeneous in appearance. Seminal vesicles are unremarkable in appearance.  Other: No significant volume of ascites.  No pneumoperitoneum.  Musculoskeletal: There are no aggressive appearing lytic or blastic lesions noted in the visualized portions of the skeleton.  VASCULAR MEASUREMENTS PERTINENT TO TAVR:  AORTA:  Minimal Aortic Diameter -  13 x 12 mm  Severity of Aortic Calcification -  severe  RIGHT PELVIS:  Right Common Iliac Artery -  Minimal Diameter - 8.4 x 7.1 mm  Tortuosity - mild  Calcification - moderate  Right External Iliac Artery -  Minimal Diameter - 9.4 x 8.6 mm  Tortuosity - mild  Calcification - none  Right Common Femoral Artery -  Minimal Diameter - 7.2 x 8.6 mm  Tortuosity  - mild  Calcification - mild  LEFT PELVIS:  Left Common Iliac Artery -  Minimal Diameter - 7.8 x 8.0 mm  Tortuosity - mild  Calcification - moderate  Left External Iliac Artery -  Minimal Diameter - 9.0 x 8.4 mm  Tortuosity - mild  Calcification - mild  Left  Common Femoral Artery -  Minimal Diameter - 8.0 x 5.9 mm  Tortuosity - mild  Calcification - mild  Review of the MIP images confirms the above findings.  IMPRESSION: 1. Vascular findings and measurements pertinent to potential TAVR procedure, as detailed above. 2. Severe thickening calcification of the aortic valve, compatible with the reported clinical history of severe aortic stenosis. 3. Aortic atherosclerosis, in addition to left main and 2 vessel coronary artery disease. Assessment for potential risk factor modification, dietary therapy or pharmacologic therapy may be warranted, if clinically indicated. 4. Additional incidental findings, as above. Aortic Atherosclerosis (ICD10-I70.0).   Electronically Signed   By: Vinnie Langton M.D.   On: 10/31/2017 16:06   STS Risk Calculator Procedure: AVR + CAB CALCULATE  Risk of Mortality:  6.022%   Renal Failure:  18.264%   Permanent Stroke:  2.216%   Prolonged Ventilation:  21.530%   DSW Infection:  0.503%   Reoperation:  3.946%   Morbidity or Mortality:  31.445%   Short Length of Stay:  12.289%   Long Length of Stay:  17.810%       Impression:  Patient has stage D severe symptomatic aortic stenosis.  He describes stable symptoms of exertional shortness of breath and fatigue consistent with chronic combined systolic and diastolic congestive heart failure, [place] Heart Association functional class II.  In addition, he has coronary artery disease without ongoing symptoms of angina pectoris and recurrent persistent atrial fibrillation.  Symptoms of shortness of breath get much worse when he goes out of rhythm, but he has been  maintaining sinus rhythm on amiodarone since his most recent cardioversion.  I have personally reviewed the patient's most recent transthoracic echocardiogram, diagnostic cardiac catheterization, and CT angiograms.  Echocardiogram confirms the presence of severe aortic stenosis.  The aortic valve is trileaflet with severe thickening, calcification, and restricted leaflet mobility involving all 3 leaflets.  Peak velocity across the aortic valve was reported only 3.3 m/s corresponding to mean transvalvular gradient 26 mmHg, but the patient was in atrial fibrillation at the time.  The DVI was quite low at 0.19.  At catheterization the patient had clear evidence for the presence of severe aortic stenosis with mean transvalvular gradient measured greater than 40 mmHg by catheterization and aortic valve area calculated only 0.79 cm.  I agree the patient needs aortic valve replacement.  Risks associated with conventional surgical aortic valve replacement would be moderately elevated with or without concomitant coronary artery bypass grafting and/or Maze procedure.  Cardiac-gated CTA of the heart reveals anatomical characteristics consistent with aortic stenosis suitable for treatment by transcatheter aortic valve replacement without any significant complicating features and CTA of the aorta and iliac vessels demonstrate what appears to be adequate pelvic vascular access to facilitate a transfemoral approach.   Plan:  The patient was counseled at length regarding treatment alternatives for management of severe symptomatic aortic stenosis.   Alternative approaches such as conventional aortic valve replacement, transcatheter aortic valve replacement, and palliative medical therapy were compared and contrasted at length.  The risks associated both conventional surgical aortic valve replacement and TAVR were been discussed in detail, as were expectations for post-operative convalescence.  The relative risks and benefits  of concomitant coronary artery bypass grafting and Maze procedure were discussed.  Issues specific to transcatheter aortic valve replacement were discussed including questions about long term valve durability, the potential for paravalvular leak, possible increased risk of need for permanent pacemaker placement, and other technical complications related to the  procedure itself.  Long-term prognosis with medical therapy was discussed. This discussion was placed in the context of the patient's own specific clinical presentation and past medical history.  All of his questions been addressed.  His wife was not present for his office consultation today because she is currently in the hospital with acute bronchitis.  The patient desires to proceed with transcatheter aortic valve replacement in the near future, but he would like to wait approximately 2 weeks to make sure that his wife has recovered from her current illness.  We tentatively plan to proceed on Tuesday, December 02, 2017.  Following the decision to proceed with transcatheter aortic valve replacement, a discussion has been held regarding what types of management strategies would be attempted intraoperatively in the event of life-threatening complications, including whether or not the patient would be considered a candidate for the use of cardiopulmonary bypass and/or conversion to open sternotomy for attempted surgical intervention.  The patient has been advised of a variety of complications that might develop including but not limited to risks of death, stroke, paravalvular leak, aortic dissection or other major vascular complications, aortic annulus rupture, device embolization, cardiac rupture or perforation, mitral regurgitation, acute myocardial infarction, arrhythmia, heart block or bradycardia requiring permanent pacemaker placement, congestive heart failure, respiratory failure, renal failure, pneumonia, infection, other late complications related to  structural valve deterioration or migration, or other complications that might ultimately cause a temporary or permanent loss of functional independence or other long term morbidity.  The patient provides full informed consent for the procedure as described and all questions were answered.   I spent in excess of 90 minutes during the conduct of this office consultation and >50% of this time involved direct face-to-face encounter with the patient for counseling and/or coordination of their care.    Valentina Gu. Roxy Manns, MD 11/12/2017 4:19 PM

## 2017-11-12 NOTE — Progress Notes (Signed)
[facility]  MULTIDISCIPLINARY HEART VALVE CLINIC  CARDIOTHORACIC SURGERY CONSULTATION REPORT  Referring Provider is Burnell Blanks, MD PCP is [name], [name]  Chief Complaint  Patient presents with  . 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  The patient's cardiac history dates back to 2012 when he suffered an acute myocardial infarction.  At the time he was treated at [facility] where catheterization reportedly revealed occluded right coronary artery with collaterals.  Medical therapy was recommended.  The patient developed paroxysmal atrial fibrillation and has been followed intermittently ever since on long-term anticoagulation using Pradaxa.  He was noted to have a heart murmur on physical exam and echocardiograms revealed aortic stenosis that has gradually progressed in severity.  For the last several years the patient has been followed by Dr. Angelena Form for management of aortic stenosis, coronary artery disease, and atrial fibrillation.  He has been followed by Dr. Fletcher Anon in the [facility] clinic.  Last spring he was noted to be back in atrial fibrillation and he was referred to the atrial fibrillation clinic.  He was loaded with amiodarone he has been maintaining sinus rhythm since he underwent repeat  cardioversion performed September 2018.  He was seen in follow-up recently by Dr. Angelena Form where he reported slow progression of symptoms of exertional shortness of breath and fatigue despite the fact that he was maintaining sinus rhythm.   Most recent follow-up transthoracic echocardiogram performed June 16, 2017 suggested significant progression in the severity of aortic stenosis.  The patient was in atrial fibrillation at the time of the echocardiogram, and peak velocity across the aortic valve range between different length cardiac cycles.  Peak velocity was reported 3.3 m/s corresponding to mean transvalvular gradient estimated 26 mmHg, but the DVI was quite low at 0.19.  The aortic valve area calculated only 0.8 cm.  Left ventricular function remained stable with ejection fraction estimated 50-55%.  The patient subsequently underwent left and right heart catheterization on October 08, 2017 by Dr. Angelena Form.  Catheterization revealed the presence of severe aortic stenosis with peak to peak and mean transvalvular gradients measured 44 and 41.8 mmHg, respectively, corresponding to aortic valve area calculated 0.79 cm.  There was moderate 60% proximal stenosis of the left anterior descending coronary artery that was felt not to be flow limiting.  There was 100% chronic occlusion of the right coronary artery.  There was otherwise nonobstructive coronary artery disease.  Pulmonary artery pressures were mildly elevated.  Patient subsequently underwent CT angiography and has been referred for surgical consultation.  The patient is married and lives locally in [place] with his wife.  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  He has remained reasonably active physically, although he states that he is currently limited both by exertional shortness of breath and by chronic pain in both feet related to diabetic neuropathy.  He states  that over the past 6-7 months he has developed worsening exertional shortness of breath.  Symptoms were much worse when he is out of rhythm, but he still gets short of breath with moderate level activity.  He denies any resting shortness of breath, PND, orthopnea, or lower extremity edema.  He has not had any chest pain or chest tightness either with activity or at rest.  Past Medical History:  Diagnosis Date  . Aortic stenosis    severe  . Cataract associated with type 2 diabetes mellitus ([place]) 10/06/2013  . CKD (chronic kidney disease), stage III ([place])   . Coronary artery disease    a. Cath February 2012 in [facility], occluded RCA with collaterals  . Diabetic ulcer of heel ([place]) 07/14/2014  . DM type 2 (diabetes mellitus, type 2) ([place])   . Essential hypertension   . Hyperkalemia   . Hyperlipidemia   . NSTEMI (non-ST elevated myocardial infarction) ([place]) 12/2010  . Obstructive sleep apnea    2012  . Persistent atrial fibrillation ([place]) 2012   multiple prior cardioversions  . PVD (peripheral vascular disease) ([place])    a. s/p R popliteal artery stenosis tx with drug-coated balloon 05/2014, followed by Dr. Fletcher Anon.  . Skin cancer    "cut off back of neck X 2; off left upper arm; right wrist, near right shoulder blade; several burned off other areas of my body" (06/08/2014)  . Sleep apnea with use of continuous positive airway pressure (CPAP)    04-11-11 AHI was 32.9 and titrated to 15 cm H20, DME is [facility]  . Subclinical hypothyroidism     Past Surgical History:  Procedure Laterality Date  . ABDOMINAL ANGIOGRAM N/A 06/08/2014   Procedure: ABDOMINAL ANGIOGRAM;  Surgeon: Wellington Hampshire, MD;  Location: [facility] CATH LAB;  Service: Cardiovascular;  Laterality: N/A;  . APPENDECTOMY  1965  . CARDIAC CATHETERIZATION  12/2010  . CARDIOVERSION  07/2011  . CARDIOVERSION N/A 04/18/2014   Procedure: CARDIOVERSION;  Surgeon: Dorothy Spark, MD;  Location: [place];  Service: Cardiovascular;  Laterality:  N/A;  . CARDIOVERSION N/A 11/03/2015   Procedure: CARDIOVERSION;  Surgeon: Lelon Perla, MD;  Location: [facility] ENDOSCOPY;  Service: Cardiovascular;  Laterality: N/A;  . CARDIOVERSION N/A 05/08/2017   Procedure: CARDIOVERSION;  Surgeon: Dorothy Spark, MD;  Location: [facility] ENDOSCOPY;  Service: Cardiovascular;  Laterality: N/A;  . CARDIOVERSION N/A 07/28/2017   Procedure: CARDIOVERSION;  Surgeon: Dorothy Spark, MD;  Location: [place];  Service: Cardiovascular;  Laterality: N/A;  . LOWER EXTREMITY ANGIOGRAM N/A 06/08/2014   Procedure: LOWER EXTREMITY ANGIOGRAM;  Surgeon: Wellington Hampshire, MD;  Location: [facility] CATH LAB;  Service: Cardiovascular;  Laterality: N/A;  . POPLITEAL ARTERY ANGIOPLASTY Right 06/08/2014   Archie Endo 06/08/2014  . RIGHT/LEFT HEART CATH AND CORONARY ANGIOGRAPHY N/A 10/08/2017   Procedure: RIGHT/LEFT HEART CATH AND CORONARY ANGIOGRAPHY;  Surgeon: Burnell Blanks, MD;  Location: [place] CV LAB;  Service: Cardiovascular;  Laterality: N/A;  . SKIN CANCER EXCISION Bilateral    "have had them cut off back of neck X 2; off left upper arm; right wrist, near right shoulder blade" (06/08/2014)    Family History  Problem Relation Age of Onset  . Diabetes Mother   . Heart attack Mother   . Hypertension Mother   . Heart attack Father   . Heart failure Father   . Hypertension Father   . Diabetes Father   . Diabetes Sister   . Diabetes Brother   . Diabetes  Other   . Diabetes Daughter        TYPE ll  . Heart Problems Daughter   . Hypertension Sister   . Hypertension Brother   . Stroke Brother     Social History   Socioeconomic History  . Marital status: Married    Spouse name: [name]  . Number of children: 1  . Years of education: 28  . Highest education level: Not on file  Social Needs  . Financial resource strain: Not on file  . Food insecurity - worry: Not on file  . Food insecurity - inability: Not on file  . Transportation needs - medical: Not on  file  . Transportation needs - non-medical: Not on file  Occupational History  . Occupation: retired    Fish farm manager: [employer]  Tobacco Use  . Smoking status: Former Smoker    Packs/day: 2.00    Years: 14.00    Pack years: 28.00    Types: Cigarettes    Last attempt to quit: 11/11/1972    Years since quitting: 45.0  . Smokeless tobacco: Never Used  Substance and Sexual Activity  . Alcohol use: No    Comment: quit in 1984  . Drug use: No  . Sexual activity: Not Currently  Other Topics Concern  . 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   Patient has one child and his wife has one child.   Patient is retired.   Patient has a high school education.   Patient is right-handed.   Patient drinks very little caffeine.    Current Outpatient Medications  Medication Sig Dispense Refill  . amiodarone (PACERONE) 200 MG tablet Take 1 tablet (200 mg total) by mouth daily. 90 tablet 3  . aspirin EC 81 MG tablet Take 81 mg by mouth daily.    [name] Kitchen atorvastatin (LIPITOR) 80 MG tablet TAKE 1 TABLET EVERY DAY AT BEDTIME 90 tablet 0  . BD PEN NEEDLE NANO U/F 32G X 4 MM MISC USE 4 (FOUR) TIMES DAILY. 400 each 1  . Blood Glucose Monitoring Suppl (FREESTYLE LITE) DEVI 1 each by Does not apply route 2 (two) times daily. 1 each 0  . Choline Fenofibrate 135 MG capsule TAKE 1 CAPSULE DAILY 90 capsule 3  . docusate sodium (COLACE) 100 MG capsule Take 100 mg daily by mouth.     . fesoterodine (TOVIAZ) 4 MG TB24 tablet Take 4 mg by mouth daily.    . furosemide (LASIX) 20 MG tablet Take 1 tablet (20 mg total) by mouth daily. 90 tablet 3  . glipiZIDE (GLUCOTROL XL) 10 MG 24 hr tablet TAKE 1 TABLET (10 MG TOTAL) BY MOUTH DAILY WITH BREAKFAST. 30 tablet 5  . glucose blood (FREESTYLE LITE) test strip 1 each by Other route 4 (four) times daily -  before meals and at bedtime. 450 each 3  . Insulin Glargine (BASAGLAR KWIKPEN) 100 UNIT/ML SOPN Inject 0.65  mLs (65 Units total) into the skin at bedtime. 30 mL 0  . insulin lispro (HUMALOG) 100 UNIT/ML KiwkPen Inject 0.05 mLs (5 Units total) into the skin 3 (three) times daily. 15 mL 3  . levothyroxine (SYNTHROID) 25 MCG tablet Take 1 tablet by mouth daily for 3 weeks. Then increase to 2 tablets daily 60 tablet 1  . magnesium oxide (MAG-OX) 400 MG tablet Take 1 tablet (400 mg total) by mouth daily. 30 tablet 11  . magnesium oxide (MAG-OX) 400  MG tablet TAKE 1 TABLET (400 MG TOTAL) BY MOUTH DAILY. 30 tablet 11  . metoprolol tartrate (LOPRESSOR) 50 MG tablet Take one and one-half tablet by mouth twice a day 270 tablet 3  . MYRBETRIQ 25 MG TB24 tablet TAKE 1 TABLET BY MOUTH EVERY DAY 90 tablet 0  . PRADAXA 150 MG CAPS capsule TAKE 1 CAPSULE EVERY 12    HOURS 180 capsule 3   No current facility-administered medications for this visit.     No Known Allergies    Review of Systems:   General:  normal appetite, decreased energy, no weight gain, no weight loss, no fever  Cardiac:  no chest pain with exertion, no chest pain at rest, + SOB with exertion, no resting SOB, no PND, no orthopnea, no palpitations, + arrhythmia, + atrial fibrillation, NO LE edema, NO dizzy spells, NO syncope  Respiratory:  + exertional shortness of breath, no home oxygen, no productive cough, no dry cough, no bronchitis, no wheezing, no hemoptysis, no asthma, no pain with inspiration or cough, + sleep apnea, + CPAP at night  GI:   no difficulty swallowing, no reflux, no frequent heartburn, no hiatal hernia, no abdominal pain, occasional constipation, no diarrhea, no hematochezia, no hematemesis, no melena  GU:   no dysuria,  no frequency, no urinary tract infection, no hematuria, no enlarged prostate, no kidney stones, + kidney disease  Vascular:  no pain suggestive of claudication, + chronic pain in feet, no leg cramps, no varicose veins, no DVT, no non-healing foot ulcer  Neuro:   no stroke, no TIA's, no seizures, no headaches,  no temporary blindness one eye,  no slurred speech, + peripheral neuropathy, + chronic pain, no instability of gait, no memory/cognitive dysfunction  Musculoskeletal: no arthritis, no joint swelling, no myalgias, mild difficulty walking, normal mobility   Skin:   no rash, no itching, no skin infections, no pressure sores or ulcerations  Psych:   no anxiety, no depression, no nervousness, no unusual recent stress  Eyes:   + blurry vision, no floaters, + recent vision changes - needs cataract surgery, + wears glasses or contacts  ENT:   no hearing loss, no loose or painful teeth, edentulous with full dentures, last saw dentist many years ago  Hematologic:  no easy bruising, no abnormal bleeding, no clotting disorder, no frequent epistaxis  Endocrine:  + diabetes, does check CBG's at home           Physical Exam:   Ht 5\' 11"  (1.803 m)   Wt 249 lb (112.9 kg)   BMI 34.73 kg/m   General:  Moderately obese,  well-appearing  HEENT:  Unremarkable   Neck:   no JVD, no bruits, no adenopathy   Chest:   clear to auscultation, symmetrical breath sounds, no wheezes, no rhonchi   CV:   RRR, grade III/VI crescendo/decrescendo murmur heard best at RUSB,  no diastolic murmur  Abdomen:  soft, non-tender, no masses   Extremities:  warm, well-perfused, pulses diminished but palpable, no LE edema  Rectal/GU  Deferred  Neuro:   Grossly non-focal and symmetrical throughout  Skin:   Clean and dry, no rashes, no breakdown   Diagnostic Tests:  Transthoracic Echocardiography  Patient:    Steven Maldonado, Steven Maldonado MR #:       [id number] Study Date: 06/16/2017 Gender:     M Age:        22 Height:     180.3 cm Weight:     114.3 kg BSA:  2.43 m^2 Pt. Status: Room:   SONOGRAPHER  Wyatt Mage, RDCS  ATTENDING    Sonny Dandy, Christopher  REFERRING    McAlhany, Christopher  PERFORMING   [facility],  Outpatient  cc:  ------------------------------------------------------------------- LV EF: 50% -   55%  ------------------------------------------------------------------- Indications:      Aortic stenosis (I35).  ------------------------------------------------------------------- History:   PMH:   Dyspnea.  Atrial fibrillation.  Coronary artery disease.  Aortic valve disease.  Risk factors:  OSA on CPAP. CKD. Former tobacco use. Hypertension. Diabetes mellitus. Dyslipidemia.   ------------------------------------------------------------------- Study Conclusions  - Procedure narrative: Transthoracic echocardiography. Image   quality was suboptimal. The study was technically difficult, as a   result of poor acoustic windows, poor sound wave transmission,   and body habitus. Intravenous contrast (Definity) was   administered. - Left ventricle: The cavity size was normal. Wall thickness was   normal. Systolic function was normal. The estimated ejection   fraction was in the range of 50% to 55%. The study is not   technically sufficient to allow evaluation of LV diastolic   function. - Aortic valve: Heavily calcified aortic valve with severe   stenosis. Mean gradient (S): 26 mm Hg. Peak gradient (S): 45 mm   Hg. Valve area (VTI): 0.81 cm^2. Valve area (Vmax): 0.92 cm^2.   Valve area (Vmean): 0.81 cm^2. - Mitral valve: Calcified annulus. Mildly thickened leaflets . - Left atrium: Moderately dilated.  Impressions:  - Technically difficult study. LVEF lower at 50-55%. A-fib with RVR   is present. There is likely severe aortic stenosis, however, the   mean aortic valve gradient is lower in this study compared to the   prior study, but calculated AVA is still around 0.9 cm2 based on   an LVOT diameter of 2.3 cm.  ------------------------------------------------------------------- Labs, prior tests, procedures, and surgery: Transthoracic echocardiography (11/19/2016).     The aortic valve showed moderate to severe stenosis.  EF was 60%. Aortic valve: peak gradient of 60 mm Hg and mean gradient of 32 mm Hg.  ------------------------------------------------------------------- Study data:  Comparison was made to the study of 11/19/2016.  Study status:  Routine.  Procedure:  The patient reported no pain pre or post test. Transthoracic echocardiography. Image quality was suboptimal. The study was technically difficult, as a result of poor acoustic windows, poor sound wave transmission, and body habitus. Intravenous contrast (Definity) was administered.  Study completion:  There were no complications.          Transthoracic echocardiography.  M-mode, complete 2D, spectral Doppler, and color Doppler.  Birthdate:  Patient birthdate: 11/17/1940.  Age:  Patient is 77 yr old.  Sex:  Gender: male.    BMI: 35.2 kg/m^2.  Blood pressure:     132/82  Patient status:  Outpatient.  Study date: Study date: 06/16/2017. Study time: 01:47 PM.  Location:  [place] [facility]  -------------------------------------------------------------------  ------------------------------------------------------------------- Left ventricle:  The cavity size was normal. Wall thickness was normal. Systolic function was normal. The estimated ejection fraction was in the range of 50% to 55%. The study is not technically sufficient to allow evaluation of LV diastolic function.  ------------------------------------------------------------------- Aortic valve:  Heavily calcified aortic valve with severe stenosis.  Doppler:     VTI ratio of LVOT to aortic valve: 0.19. Valve area (VTI): 0.81 cm^2. Indexed valve area (VTI): 0.33 cm^2/m^2. Peak velocity ratio of LVOT to aortic valve: 0.22. Valve area (Vmax): 0.92 cm^2. Indexed valve area (Vmax): 0.38 cm^2/m^2.  Mean velocity ratio of LVOT to aortic valve: 0.2. Valve area (Vmean): 0.81 cm^2. Indexed valve area (Vmean): 0.33 cm^2/m^2.    Mean  gradient (S): 26 mm Hg. Peak gradient (S): 45 mm Hg.  ------------------------------------------------------------------- Aorta:  Aortic root: The aortic root was normal in size. Ascending aorta: The ascending aorta was normal in size.  ------------------------------------------------------------------- Mitral valve:   Calcified annulus. Mildly thickened leaflets . Doppler:  There was trivial regurgitation.  ------------------------------------------------------------------- Left atrium:  Moderately dilated.  ------------------------------------------------------------------- Right ventricle:  Poorly visualized.  ------------------------------------------------------------------- Pulmonic valve:    The valve appears to be grossly normal. Doppler:  There was trivial regurgitation.  ------------------------------------------------------------------- Pulmonary artery:   The main pulmonary artery was normal-sized.  ------------------------------------------------------------------- Right atrium:  The atrium was normal in size.  ------------------------------------------------------------------- Pericardium:  There was no pericardial effusion.  ------------------------------------------------------------------- Systemic veins: Inferior vena cava: The vessel was normal in size. The respirophasic diameter changes were in the normal range (>= 50%), consistent with normal central venous pressure.  ------------------------------------------------------------------- Measurements   Left ventricle                            Value          Reference  LV ID, ED, PLAX chordal           (H)     54.5  mm       43 - 52  LV ID, ES, PLAX chordal           (H)     43    mm       23 - 38  LV fx shortening, PLAX chordal    (L)     21    %        >=29  LV PW thickness, ED                       10.9  mm       ---------  IVS/LV PW ratio, ED                       0.95           <=1.3   Stroke volume, 2D                         49    ml       ---------  Stroke volume/bsa, 2D                     20    ml/m^2   ---------    Ventricular septum                        Value          Reference  IVS thickness, ED                         10.4  mm       ---------    LVOT                                      Value          Reference  LVOT ID, S  23    mm       ---------  LVOT area                                 4.15  cm^2     ---------  LVOT peak velocity, S                     73.6  cm/s     ---------  LVOT mean velocity, S                     45.7  cm/s     ---------  LVOT VTI, S                               12.9  cm       ---------  Stroke volume (SV), LVOT DP               53.6  ml       ---------  Stroke index (SV/bsa), LVOT DP            22    ml/m^2   ---------    Aortic valve                              Value          Reference  Aortic valve peak velocity, S             334   cm/s     ---------  Aortic valve mean velocity, S             234   cm/s     ---------  Aortic valve VTI, S                       66.2  cm       ---------  Aortic mean gradient, S                   26    mm Hg    ---------  Aortic peak gradient, S                   45    mm Hg    ---------  VTI ratio, LVOT/AV                        0.19           ---------  Aortic valve area, VTI                    0.81  cm^2     ---------  Aortic valve area/bsa, VTI                0.33  cm^2/m^2 ---------  Velocity ratio, peak, LVOT/AV             0.22           ---------  Aortic valve area, peak velocity          0.92  cm^2     ---------  Aortic valve area/bsa, peak               0.38  cm^2/m^2 ---------  velocity  Velocity ratio, mean, LVOT/AV  0.2            ---------  Aortic valve area, mean velocity          0.81  cm^2     ---------  Aortic valve area/bsa, mean               0.33  cm^2/m^2 ---------  velocity    Aorta                                     Value           Reference  Aortic root ID, ED                        37    mm       ---------  Ascending aorta ID, A-P, S                31    mm       ---------    Left atrium                               Value          Reference  LA ID, A-P, ES                            44    mm       ---------  LA ID/bsa, A-P                            1.81  cm/m^2   <=2.2  LA volume, S                              90.6  ml       ---------  LA volume/bsa, S                          37.2  ml/m^2   ---------  LA volume, ES, 1-p A4C                    67.2  ml       ---------  LA volume/bsa, ES, 1-p A4C                27.6  ml/m^2   ---------  LA volume, ES, 1-p A2C                    108   ml       ---------  LA volume/bsa, ES, 1-p A2C                44.4  ml/m^2   ---------    Systemic veins                            Value          Reference  Estimated CVP                             3     mm Hg    ---------  Legend: (L)  and  (H)  mark values outside specified reference range.  ------------------------------------------------------------------- Prepared and Electronically Authenticated by  Lyman Bishop MD 2018-08-06T17:51:42     RIGHT/LEFT HEART CATH AND CORONARY ANGIOGRAPHY  Conclusion     Prox RCA lesion is 100% stenosed.  Ost LAD to Prox LAD lesion is 60% stenosed.  There is severe aortic valve stenosis.  Hemodynamic findings consistent with mild pulmonary hypertension.   1. Chronic total occlusion of the RCA. The mid and distal RCA fills from left to right collaterals.  2. Moderate stenosis proximal LAD. This does not appear to be flow limiting.  3. Severe aortic valve stenosis (mean gradient 41.8 mmHg, peak to peak gradient 44 mmHg, AVA 0.79 cm2)  Recommendations: Will continue workup for TAVR.    Indications   Severe aortic stenosis [I35.0 (ICD-10-CM)]  Procedural Details/Technique   Technical Details Indication: 77 yo male with h/o CAD, DM, HTN and severe AS here today for  cardiac cath in workup for TAVR  Procedure: The risks, benefits, complications, treatment options, and expected outcomes were discussed with the patient. The patient and/or family concurred with the proposed plan, giving informed consent. The patient was brought to the cath lab after IV hydration was given. The patient was further sedated with Versed and Fentanyl. The right groin was prepped and draped in the usual manner. Using the modified Seldinger access technique, a 5 French sheath was placed in the right femoral artery. A 7 French sheath was placed in the right femoral vein. A right heart catheterization was performed with a balloon tipped catheter. Standard diagnostic catheters were used to perform selective coronary angiography. I crossed the aortic valve with an AL-1 and a straight wire. LV pressures measured.   There were no immediate complications. The patient was taken to the recovery area in stable condition.    Estimated blood loss <50 mL.  During this procedure the patient was administered the following to achieve and maintain moderate conscious sedation: Versed 3 mg, Fentanyl 75 mcg, while the patient's heart rate, blood pressure, and oxygen saturation were continuously monitored. The period of conscious sedation was 60 minutes, of which I was present face-to-face 100% of this time.  Complications   Complications documented before study signed (10/08/2017 10:05 AM EST)    RIGHT/LEFT HEART CATH AND CORONARY ANGIOGRAPHY   None Documented by Burnell Blanks, MD 10/08/2017 10:03 AM EST  Time Range: Intra-procedure      Coronary Findings   Diagnostic  Dominance: Right  Left Anterior Descending  Ost LAD to Prox LAD lesion 60% stenosed  Ost LAD to Prox LAD lesion is 60% stenosed.  First Diagonal Branch  Vessel is moderate in size.  Second Diagonal Branch  Vessel is small in size.  Left Circumflex  Second Obtuse Marginal Branch  Vessel is large in size.  Right  Coronary Artery  Prox RCA lesion 100% stenosed  Prox RCA lesion is 100% stenosed. The lesion is chronically occluded.  Right Posterior Atrioventricular Branch  Collaterals  Post Atrio filled by collaterals from Dist Cx.    Intervention   No interventions have been documented.  Right Heart   Right Heart Pressures Hemodynamic findings consistent with mild pulmonary hypertension. Elevated LV EDP consistent with volume overload.  Left Heart   Aortic Valve There is severe aortic valve stenosis. The aortic valve is calcified.  Coronary Diagrams   Diagnostic Diagram       Implants     No implant documentation for this case.  MERGE Images   Show images for CARDIAC CATHETERIZATION   Link to Procedure Log   Procedure Log    Hemo Data    Most Recent Value  Fick Cardiac Output 5.22 L/min  Fick Cardiac Output Index 2.23 (L/min)/BSA  Aortic Mean Gradient 41.8 mmHg  Aortic Peak Gradient 44 mmHg  Aortic Valve Area 0.79  Aortic Value Area Index 0.34 cm2/BSA  RA A Wave 14 mmHg  RA V Wave 12 mmHg  RA Mean 10 mmHg  RV Systolic Pressure 54 mmHg  RV Diastolic Pressure 7 mmHg  RV EDP 14 mmHg  PA Systolic Pressure 45 mmHg  PA Diastolic Pressure 17 mmHg  PA Mean 30 mmHg  PW A Wave 25 mmHg  PW V Wave 24 mmHg  PW Mean 22 mmHg  AO Systolic Pressure 062 mmHg  AO Diastolic Pressure 52 mmHg  AO Mean 75 mmHg  LV Systolic Pressure 694 mmHg  LV Diastolic Pressure 12 mmHg  LV EDP 25 mmHg  Arterial Occlusion Pressure Extended Systolic Pressure 854 mmHg  Arterial Occlusion Pressure Extended Diastolic Pressure 62 mmHg  Arterial Occlusion Pressure Extended Mean Pressure 94 mmHg  Left Ventricular Apex Extended Systolic Pressure 627 mmHg  Left Ventricular Apex Extended Diastolic Pressure 12 mmHg  Left Ventricular Apex Extended EDP Pressure 29 mmHg  QP/QS 1  TPVR Index 13.45 HRUI  TSVR Index 33.64 HRUI  PVR SVR Ratio 0.12  TPVR/TSVR Ratio 0.4  Order-Level Documents:    Cardiac TAVR  CT  TECHNIQUE: The patient was scanned on a [employer]. A 120 kV retrospective scan was triggered in the descending thoracic aorta at 111 HU's. Gantry rotation speed was 250 msecs and collimation was .6 mm. No beta blockade or nitro were given. The 3D data set was reconstructed in 5% intervals of the R-R cycle. Systolic and diastolic phases were analyzed on a dedicated work station using MPR, MIP and VRT modes. The patient received 80 cc of contrast.  FINDINGS: Aortic Valve: Trileaflet, moderately thickened and calcified aortic valve with severely restricted leaflet opening. Mild calcifications are extending into the LVOT under the left coronary cusp.  Aorta: Normal caliber, moderate diffuse calcifications, no dissection.  Sinotubular Junction:  31 x 30 mm  Ascending Thoracic Aorta:  32 x 32 mm  Aortic Arch:  26 x 26 mm  Descending Thoracic Aorta:  24 x 23 mm  Sinus of Valsalva Measurements:  Non-coronary:  32 mm  Right -coronary:  35 mm  Left -coronary:  36 mm  Coronary Artery Height above Annulus:  Left Main:  17 mm  Right Coronary:  19 mm  Virtual Basal Annulus Measurements:  Maximum/Minimum Diameter:  29.1 x 23.9 mm  Mean Diameter:  26 mm  Perimeter:  83 mm  Area:  530 mm2  Optimum Fluoroscopic Angle for Delivery:  RAO 2 CRA 2  IMPRESSION: 1. Trileaflet, moderately thickened and calcified aortic valve with severely restricted leaflet opening. Mild calcifications are extending into the LVOT under the left coronary cusp. Annular measurements suitable for delivery of a 26 mm Edwards-SAPIEN 3 valve.  2. Sufficient coronary to annulus distance.  3. Optimum Fluoroscopic Angle for Delivery: RAO 2 CRA 2  4. No thrombus in the left atrial appendage.   Electronically Signed   By: Ena Dawley   On: 11/11/2017 16:32    CT ANGIOGRAPHY CHEST, ABDOMEN AND PELVIS  TECHNIQUE: Multidetector CT imaging through  the chest, abdomen and pelvis was performed using the standard protocol during bolus administration of intravenous  contrast. Multiplanar reconstructed images and MIPs were obtained and reviewed to evaluate the vascular anatomy.  CONTRAST:  15mL ISOVUE-370 IOPAMIDOL (ISOVUE-370) INJECTION 76%, 10mL ISOVUE-370 IOPAMIDOL (ISOVUE-370) INJECTION 76%  COMPARISON:  No priors.  FINDINGS: CTA CHEST FINDINGS  Cardiovascular: Heart size is normal. There is no significant pericardial fluid, thickening or pericardial calcification. There is aortic atherosclerosis, as well as atherosclerosis of the great vessels of the mediastinum and the coronary arteries, including calcified atherosclerotic plaque in the left main, left anterior descending and right coronary arteries. Severe thickening calcification of the aortic valve. Severe calcifications of the mitral annulus.  Mediastinum/Lymph Nodes: Multiple prominent borderline enlarged mediastinal and bilateral hilar lymph nodes are noted measuring up to 11 mm in short axis in the right hilar region, nonspecific. Esophagus is unremarkable in appearance. No axillary lymphadenopathy.  Lungs/Pleura: 5 mm pulmonary nodule in the left lower lobe (axial image 72 of series 15). No larger more suspicious appearing pulmonary nodules or masses are noted. No acute consolidative airspace disease. No pleural effusions.  Musculoskeletal/Soft Tissues: There are no aggressive appearing lytic or blastic lesions noted in the visualized portions of the skeleton.  CTA ABDOMEN AND PELVIS FINDINGS  Hepatobiliary: No suspicious cystic or solid hepatic lesions. No intra or extrahepatic biliary ductal dilatation. Gallbladder is normal in appearance.  Pancreas: No pancreatic mass. No pancreatic ductal dilatation. No pancreatic or peripancreatic fluid or inflammatory changes.  Spleen: Unremarkable.  Adrenals/Urinary Tract: 1.6 cm low-attenuation lesion  in the interpolar region of the left kidney is compatible with a simple cyst. Right kidney and bilateral adrenal glands are normal in appearance. No hydroureteronephrosis. Urinary bladder is normal in appearance.  Stomach/Bowel: The appearance of the stomach is normal. There is no pathologic dilatation of small bowel or colon. The appendix is not confidently identified and may be surgically absent. Regardless, there are no inflammatory changes noted adjacent to the cecum to suggest the presence of an acute appendicitis at this time.  Vascular/Lymphatic: Aortic atherosclerosis with vascular findings and measurements pertinent to potential TAVR procedure, as detailed below. No aneurysm or dissection noted in the abdominal or pelvic vasculature. Celiac axis and superior mesenteric artery are widely patent without hemodynamically significant stenosis. Mild narrowing at the ostium of the inferior mesenteric artery, of questionable hemodynamic significance. Bilateral renal artery is are both widely patent without hemodynamically significant stenosis. No lymphadenopathy identified in the abdomen or pelvis.  Reproductive: Prostate gland is enlarged and heterogeneous in appearance. Seminal vesicles are unremarkable in appearance.  Other: No significant volume of ascites.  No pneumoperitoneum.  Musculoskeletal: There are no aggressive appearing lytic or blastic lesions noted in the visualized portions of the skeleton.  VASCULAR MEASUREMENTS PERTINENT TO TAVR:  AORTA:  Minimal Aortic Diameter -  13 x 12 mm  Severity of Aortic Calcification -  severe  RIGHT PELVIS:  Right Common Iliac Artery -  Minimal Diameter - 8.4 x 7.1 mm  Tortuosity - mild  Calcification - moderate  Right External Iliac Artery -  Minimal Diameter - 9.4 x 8.6 mm  Tortuosity - mild  Calcification - none  Right Common Femoral Artery -  Minimal Diameter - 7.2 x 8.6 mm  Tortuosity  - mild  Calcification - mild  LEFT PELVIS:  Left Common Iliac Artery -  Minimal Diameter - 7.8 x 8.0 mm  Tortuosity - mild  Calcification - moderate  Left External Iliac Artery -  Minimal Diameter - 9.0 x 8.4 mm  Tortuosity - mild  Calcification - mild  Left  Common Femoral Artery -  Minimal Diameter - 8.0 x 5.9 mm  Tortuosity - mild  Calcification - mild  Review of the MIP images confirms the above findings.  IMPRESSION: 1. Vascular findings and measurements pertinent to potential TAVR procedure, as detailed above. 2. Severe thickening calcification of the aortic valve, compatible with the reported clinical history of severe aortic stenosis. 3. Aortic atherosclerosis, in addition to left main and 2 vessel coronary artery disease. Assessment for potential risk factor modification, dietary therapy or pharmacologic therapy may be warranted, if clinically indicated. 4. Additional incidental findings, as above. Aortic Atherosclerosis (ICD10-I70.0).   Electronically Signed   By: Vinnie Langton M.D.   On: 10/31/2017 16:06   STS Risk Calculator Procedure: AVR + CAB CALCULATE  Risk of Mortality:  6.022%   Renal Failure:  18.264%   Permanent Stroke:  2.216%   Prolonged Ventilation:  21.530%   DSW Infection:  0.503%   Reoperation:  3.946%   Morbidity or Mortality:  31.445%   Short Length of Stay:  12.289%   Long Length of Stay:  17.810%       Impression:  Patient has stage D severe symptomatic aortic stenosis.  He describes stable symptoms of exertional shortness of breath and fatigue consistent with chronic combined systolic and diastolic congestive heart failure, New York Heart Association functional class II.  In addition, he has coronary artery disease without ongoing symptoms of angina pectoris and recurrent persistent atrial fibrillation.  Symptoms of shortness of breath get much worse when he goes out of rhythm, but he has been  maintaining sinus rhythm on amiodarone since his most recent cardioversion.  I have personally reviewed the patient's most recent transthoracic echocardiogram, diagnostic cardiac catheterization, and CT angiograms.  Echocardiogram confirms the presence of severe aortic stenosis.  The aortic valve is trileaflet with severe thickening, calcification, and restricted leaflet mobility involving all 3 leaflets.  Peak velocity across the aortic valve was reported only 3.3 m/s corresponding to mean transvalvular gradient 26 mmHg, but the patient was in atrial fibrillation at the time.  The DVI was quite low at 0.19.  At catheterization the patient had clear evidence for the presence of severe aortic stenosis with mean transvalvular gradient measured greater than 40 mmHg by catheterization and aortic valve area calculated only 0.79 cm.  I agree the patient needs aortic valve replacement.  Risks associated with conventional surgical aortic valve replacement would be moderately elevated with or without concomitant coronary artery bypass grafting and/or Maze procedure.  Cardiac-gated CTA of the heart reveals anatomical characteristics consistent with aortic stenosis suitable for treatment by transcatheter aortic valve replacement without any significant complicating features and CTA of the aorta and iliac vessels demonstrate what appears to be adequate pelvic vascular access to facilitate a transfemoral approach.   Plan:  The patient was counseled at length regarding treatment alternatives for management of severe symptomatic aortic stenosis.   Alternative approaches such as conventional aortic valve replacement, transcatheter aortic valve replacement, and palliative medical therapy were compared and contrasted at length.  The risks associated both conventional surgical aortic valve replacement and TAVR were been discussed in detail, as were expectations for post-operative convalescence.  The relative risks and benefits  of concomitant coronary artery bypass grafting and Maze procedure were discussed.  Issues specific to transcatheter aortic valve replacement were discussed including questions about long term valve durability, the potential for paravalvular leak, possible increased risk of need for permanent pacemaker placement, and other technical complications related to the  procedure itself.  Long-term prognosis with medical therapy was discussed. This discussion was placed in the context of the patient's own specific clinical presentation and past medical history.  All of his questions been addressed.  His wife was not present for his office consultation today because she is currently in the hospital with acute bronchitis.  The patient desires to proceed with transcatheter aortic valve replacement in the near future, but he would like to wait approximately 2 weeks to make sure that his wife has recovered from her current illness.  We tentatively plan to proceed on Tuesday, December 02, 2017.  Following the decision to proceed with transcatheter aortic valve replacement, a discussion has been held regarding what types of management strategies would be attempted intraoperatively in the event of life-threatening complications, including whether or not the patient would be considered a candidate for the use of cardiopulmonary bypass and/or conversion to open sternotomy for attempted surgical intervention.  The patient has been advised of a variety of complications that might develop including but not limited to risks of death, stroke, paravalvular leak, aortic dissection or other major vascular complications, aortic annulus rupture, device embolization, cardiac rupture or perforation, mitral regurgitation, acute myocardial infarction, arrhythmia, heart block or bradycardia requiring permanent pacemaker placement, congestive heart failure, respiratory failure, renal failure, pneumonia, infection, other late complications related to  structural valve deterioration or migration, or other complications that might ultimately cause a temporary or permanent loss of functional independence or other long term morbidity.  The patient provides full informed consent for the procedure as described and all questions were answered.   I spent in excess of 90 minutes during the conduct of this office consultation and >50% of this time involved direct face-to-face encounter with the patient for counseling and/or coordination of their care.    Valentina Gu. Roxy Manns, MD 11/12/2017 4:19 PM

## 2017-11-13 ENCOUNTER — Other Ambulatory Visit: Payer: Self-pay

## 2017-11-13 DIAGNOSIS — I350 Nonrheumatic aortic (valve) stenosis: Secondary | ICD-10-CM

## 2017-11-14 ENCOUNTER — Other Ambulatory Visit: Payer: Self-pay | Admitting: Physician Assistant

## 2017-11-17 ENCOUNTER — Other Ambulatory Visit: Payer: Self-pay

## 2017-11-17 ENCOUNTER — Encounter: Payer: Self-pay | Admitting: Physician Assistant

## 2017-11-17 ENCOUNTER — Ambulatory Visit (INDEPENDENT_AMBULATORY_CARE_PROVIDER_SITE_OTHER): Payer: Medicare Other | Admitting: Physician Assistant

## 2017-11-17 VITALS — BP 134/68 | HR 75 | Temp 98.4°F | Resp 14 | Wt 256.4 lb

## 2017-11-17 DIAGNOSIS — I2510 Atherosclerotic heart disease of native coronary artery without angina pectoris: Secondary | ICD-10-CM | POA: Diagnosis not present

## 2017-11-17 DIAGNOSIS — J988 Other specified respiratory disorders: Secondary | ICD-10-CM

## 2017-11-17 DIAGNOSIS — B9689 Other specified bacterial agents as the cause of diseases classified elsewhere: Secondary | ICD-10-CM

## 2017-11-17 MED ORDER — CEFDINIR 300 MG PO CAPS: 300.0000 mg | ORAL_CAPSULE | Freq: Two times a day (BID) | ORAL | 0 refills | Status: DC

## 2017-11-17 NOTE — Progress Notes (Signed)
Patient ID: Steven Maldonado MRN: [id number], DOB: 23-Dec-1940, 77 y.o. Date of Encounter: 11/17/2017, 3:50 PM    Chief Complaint:  Chief Complaint  Patient presents with  . Cough    x3days  . sneezing  . Nasal Congestion     HPI: 77 y.o. year old male presents with above.   He is scheduled for aortic valve surgery January 22. He wants to make sure that his surgery does not get postponed. His wife has recently been in the hospital secondary to bronchitis. He states that he does not know whether his illness is from his wife or not but says that he has not had any other known sick contacts.  He has been having runny nose and nasal congestion and feels like it is going into his chest now.  Also having some cough.  Has had no known fevers or chills.  No significant sore throat.  No other symptoms.  His last visit with me he promised that he was going to be compliant with diet.  He reports that he has been compliant with low-carb diet.  Says that his average blood sugars have been 96 and then for another 14-day average it was 103.  Says that his highest recent reading was 149 and other readings have been around 85 and 90.  No other concerns to address today.   Home Meds:   Outpatient Medications Prior to Visit  Medication Sig Dispense Refill  . amiodarone (PACERONE) 200 MG tablet Take 1 tablet (200 mg total) by mouth daily. 90 tablet 3  . aspirin EC 81 MG tablet Take 81 mg by mouth daily.    [name] Kitchen atorvastatin (LIPITOR) 80 MG tablet TAKE 1 TABLET EVERY DAY AT BEDTIME 90 tablet 0  . BD PEN NEEDLE NANO U/F 32G X 4 MM MISC USE 4 (FOUR) TIMES DAILY. 400 each 1  . Blood Glucose Monitoring Suppl (FREESTYLE LITE) DEVI 1 each by Does not apply route 2 (two) times daily. 1 each 0  . Choline Fenofibrate 135 MG capsule TAKE 1 CAPSULE DAILY 90 capsule 3  . docusate sodium (COLACE) 100 MG capsule Take 100 mg daily by mouth.     . fesoterodine (TOVIAZ) 4 MG TB24 tablet Take 4 mg by mouth daily.      . furosemide (LASIX) 20 MG tablet Take 1 tablet (20 mg total) by mouth daily. 90 tablet 3  . glipiZIDE (GLUCOTROL XL) 10 MG 24 hr tablet TAKE 1 TABLET (10 MG TOTAL) BY MOUTH DAILY WITH BREAKFAST. 30 tablet 5  . glucose blood (FREESTYLE LITE) test strip 1 each by Other route 4 (four) times daily -  before meals and at bedtime. 450 each 3  . Insulin Glargine (BASAGLAR KWIKPEN) 100 UNIT/ML SOPN Inject 0.65 mLs (65 Units total) into the skin at bedtime. 30 mL 0  . insulin lispro (HUMALOG) 100 UNIT/ML KiwkPen Inject 0.05 mLs (5 Units total) into the skin 3 (three) times daily. 15 mL 3  . levothyroxine (SYNTHROID) 25 MCG tablet Take 1 tablet by mouth daily for 3 weeks. Then increase to 2 tablets daily 60 tablet 1  . magnesium oxide (MAG-OX) 400 MG tablet Take 1 tablet (400 mg total) by mouth daily. 30 tablet 11  . magnesium oxide (MAG-OX) 400 MG tablet TAKE 1 TABLET (400 MG TOTAL) BY MOUTH DAILY. 30 tablet 11  . metoprolol tartrate (LOPRESSOR) 50 MG tablet Take one and one-half tablet by mouth twice a day 270 tablet 3  . MYRBETRIQ 25  MG TB24 tablet TAKE 1 TABLET BY MOUTH EVERY DAY 90 tablet 0  . PRADAXA 150 MG CAPS capsule TAKE 1 CAPSULE EVERY 12    HOURS 180 capsule 3   No facility-administered medications prior to visit.     Allergies: No Known Allergies    Review of Systems: See HPI for pertinent ROS. All other ROS negative.    Physical Exam:  Blood pressure 134/68, pulse 75, temperature 98.4 F (36.9 C), temperature source Oral, resp. rate 14, weight 116.3 kg (256 lb 6.4 oz), SpO2 98 %., Body mass index is 35.76 kg/m. General:  WM. Appears in no acute distress. HEENT: Normocephalic, atraumatic, eyes without discharge, sclera non-icteric, nares are without discharge. Bilateral auditory canals clear, TM's are without perforation, pearly grey and translucent with reflective cone of light bilaterally. Oral cavity moist, posterior pharynx without exudate, erythema, peritonsillar abscess.  No  tenderness with percussion to frontal or maxillary sinuses bilaterally.  Neck: Supple. No thyromegaly. No lymphadenopathy. Lungs: Clear bilaterally to auscultation without wheezes, rales, or rhonchi. Breathing is unlabored. Heart: Regular rhythm. Murmur. Msk:  Strength and tone normal for age. Extremities/Skin: Warm and dry.  Neuro: Alert and oriented X 3. Moves all extremities spontaneously. Gait is normal. CNII-XII grossly in tact. Psych:  Responds to questions appropriately with a normal affect.     ASSESSMENT AND PLAN:  77 y.o. year old male with  1. Bacterial respiratory infection Avoid azithromycin as he is on amiodarone. He is to take [facility] as directed and complete all of it.  Follow-up if symptoms worsen or if symptoms do not resolve upon completion of antibiotic. - cefdinir (OMNICEF) 300 MG capsule; Take 1 capsule (300 mg total) by mouth 2 (two) times daily.  Dispense: 20 capsule; Refill: 0   Signed, [address] [place], [place], [facility] 11/17/2017 3:50 PM

## 2017-11-19 ENCOUNTER — Encounter: Payer: Self-pay | Admitting: Surgery

## 2017-11-19 ENCOUNTER — Other Ambulatory Visit: Payer: Self-pay

## 2017-11-19 ENCOUNTER — Institutional Professional Consult (permissible substitution) (INDEPENDENT_AMBULATORY_CARE_PROVIDER_SITE_OTHER): Payer: Medicare Other | Admitting: Surgery

## 2017-11-19 ENCOUNTER — Ambulatory Visit: Payer: Medicare Other | Attending: Cardiovascular Disease | Admitting: Physical Therapy

## 2017-11-19 ENCOUNTER — Encounter: Payer: Self-pay | Admitting: Physical Therapy

## 2017-11-19 VITALS — BP 132/75 | HR 84 | Resp 20 | Ht 71.0 in | Wt 255.2 lb

## 2017-11-19 DIAGNOSIS — I350 Nonrheumatic aortic (valve) stenosis: Secondary | ICD-10-CM

## 2017-11-19 DIAGNOSIS — I2510 Atherosclerotic heart disease of native coronary artery without angina pectoris: Secondary | ICD-10-CM

## 2017-11-19 DIAGNOSIS — R2689 Other abnormalities of gait and mobility: Secondary | ICD-10-CM | POA: Insufficient documentation

## 2017-11-19 NOTE — Therapy (Signed)
[place], [name], [address] Phone: [phone]   Fax:  [phone]  Physical Therapy Evaluation  Patient Details  Name: Steven Maldonado MRN: [id number] Date of Birth: 10-01-1941 Referring Provider: Dr. [name]   Encounter Date: 11/19/2017  PT End of Session - 11/19/17 0945    Visit Number  1    PT Start Time  0934    PT Stop Time  1015    PT Time Calculation (min)  41 min       Past Medical History:  Diagnosis Date  . Aortic stenosis    severe  . Cataract associated with type 2 diabetes mellitus ([place]) 10/06/2013  . CKD (chronic kidney disease), stage III ([place])   . Coronary artery disease    a. Cath February 2012 in [facility], occluded RCA with collaterals  . Diabetic ulcer of heel ([place]) 07/14/2014  . DM type 2 (diabetes mellitus, type 2) ([place])   . Essential hypertension   . Hyperkalemia   . Hyperlipidemia   . NSTEMI (non-ST elevated myocardial infarction) ([place]) 12/2010  . Obstructive sleep apnea    2012  . Persistent atrial fibrillation ([place]) 2012   multiple prior cardioversions  . PVD (peripheral vascular disease) ([place])    a. s/p R popliteal artery stenosis tx with drug-coated balloon 05/2014, followed by Dr. Fletcher Anon.  . Skin cancer    "cut off back of neck X 2; off left upper arm; right wrist, near right shoulder blade; several burned off other areas of my body" (06/08/2014)  . Sleep apnea with use of continuous positive airway pressure (CPAP)    04-11-11 AHI was 32.9 and titrated to 15 cm H20, DME is [facility]  . Subclinical hypothyroidism     Past Surgical History:  Procedure Laterality Date  . ABDOMINAL ANGIOGRAM N/A 06/08/2014   Procedure: ABDOMINAL ANGIOGRAM;  Surgeon: Wellington Hampshire, MD;  Location: [facility] CATH LAB;  Service: Cardiovascular;  Laterality: N/A;  . APPENDECTOMY  1965  . CARDIAC CATHETERIZATION  12/2010  . CARDIOVERSION  07/2011  . CARDIOVERSION N/A 04/18/2014   Procedure:  CARDIOVERSION;  Surgeon: Dorothy Spark, MD;  Location: [place];  Service: Cardiovascular;  Laterality: N/A;  . CARDIOVERSION N/A 11/03/2015   Procedure: CARDIOVERSION;  Surgeon: Lelon Perla, MD;  Location: [facility] ENDOSCOPY;  Service: Cardiovascular;  Laterality: N/A;  . CARDIOVERSION N/A 05/08/2017   Procedure: CARDIOVERSION;  Surgeon: Dorothy Spark, MD;  Location: [facility] ENDOSCOPY;  Service: Cardiovascular;  Laterality: N/A;  . CARDIOVERSION N/A 07/28/2017   Procedure: CARDIOVERSION;  Surgeon: Dorothy Spark, MD;  Location: [place];  Service: Cardiovascular;  Laterality: N/A;  . LOWER EXTREMITY ANGIOGRAM N/A 06/08/2014   Procedure: LOWER EXTREMITY ANGIOGRAM;  Surgeon: Wellington Hampshire, MD;  Location: [facility] CATH LAB;  Service: Cardiovascular;  Laterality: N/A;  . POPLITEAL ARTERY ANGIOPLASTY Right 06/08/2014   Archie Endo 06/08/2014  . RIGHT/LEFT HEART CATH AND CORONARY ANGIOGRAPHY N/A 10/08/2017   Procedure: RIGHT/LEFT HEART CATH AND CORONARY ANGIOGRAPHY;  Surgeon: Burnell Blanks, MD;  Location: [place] CV LAB;  Service: Cardiovascular;  Laterality: N/A;  . SKIN CANCER EXCISION Bilateral    "have had them cut off back of neck X 2; off left upper arm; right wrist, near right shoulder blade" (06/08/2014)    There were no vitals filed for this visit.   Subjective Assessment - 11/19/17 0936    Subjective  Pt has history of MI in 2012 and Afib. He reports a  slow progression of shortness of breath and fatigue that has signifcantly progressed over the past 6-7 months. Limited with lifting, unloading groceries, etc.     Patient Stated Goals  to fix heart valve    Currently in Pain?  No/denies         [facility] PT Assessment - 11/19/17 0001      Assessment   Medical Diagnosis  severe aortic stenosis    Referring Provider  Dr. [name]    Onset Date/Surgical Date  -- approximately 6-7 months ago      Precautions   Precautions  None      Restrictions   Weight  Bearing Restrictions  No      Balance Screen   Has the patient fallen in the past 6 months  No    Has the patient had a decrease in activity level because of a fear of falling?   No    Is the patient reluctant to leave their home because of a fear of falling?   No      Home Environment   Living Environment  Private residence    Living Arrangements  Spouse/significant other    Home Access  Stairs to enter    Entrance Stairs-Number of Steps  2-3    Entrance Stairs-Rails  Left    [place]  One level      Prior Function   Level of Independence  Independent with community mobility without device      Posture/Postural Control   Posture/Postural Control  Postural limitations    Postural Limitations  Rounded Shoulders;Forward head      ROM / Strength   AROM / PROM / Strength  AROM;Strength      AROM   Overall AROM Comments  grossly WNL throughout      Strength   Overall Strength Comments  grossly 5/5 throughout    Strength Assessment Site  Hand    Right/Left hand  Right;Left    Right Hand Grip (lbs)  75 R hand dominant    Left Hand Grip (lbs)  75      Ambulation/Gait   Gait Comments  Pt ambulates with wide BOS and decreased knee flexion. Gait distance is limited by 67% for age/gender in 6 minute walk.        [facility] Pre-Surgical Assessment - 11/19/17 0001    5 Meter Walk Test- trial 1  6 sec    5 Meter Walk Test- trial 2  7 sec.     5 Meter Walk Test- trial 3  7 sec.    5 meter walk test average  6.75 sec    4 Stage Balance Test tolerated for:   10 sec.    4 Stage Balance Test Position  2    Sit To Stand Test- trial 1  15 sec.    ADL/IADL Independent with:  Bathing;Meal prep;Dressing;Finances;Yard work    6 Minute Walk- Baseline  yes    BP (mmHg)  158/73    HR (bpm)  83    02 Sat (%RA)  98 %    Modified Borg Scale for Dyspnea  0- Nothing at all    Perceived Rate of Exertion (Borg)  6-    6 Minute Walk Post Test  yes    BP (mmHg)  154/86    HR (bpm)  105    02 Sat  (%RA)  96 %    Modified Borg Scale for Dyspnea  2- Mild shortness of breath  Perceived Rate of Exertion (Borg)  13- Somewhat hard    Aerobic Endurance Distance Walked  565           Objective measurements completed on examination: See above findings.              PT Education - 11/19/17 1016    Education provided  Yes    Education Details  fall risk, safe exercise progression once heart valve repaired and cleared from MD    Person(s) Educated  Patient    Methods  Explanation    Comprehension  Verbalized understanding                  Plan - 11/19/17 1017    Clinical Impression Statement  see below    PT Frequency  One time visit      Clinical Impression Statement: Pt is a 77 yo male presenting to OP PT for evaluation prior to possible TAVR surgery due to severe aortic stenosis. Pt reports onset of progressive shortness of breath and fatigue approximately 6-7 months ago. Symptoms are limiting his ability to lift and work around the house. Pt presents with good ROM and strength, poor balance and is at high fall risk 4 stage balance test, slightly decreased walking speed and poor aerobic endurance per 6 minute walk test. Pt ambulated 380 feet in 2:40 before requesting a seated rest beak lasting 2:10. Pt ambulated a total of 565 feet in 6 minute walk. Pt reports 6 to 7/10 anterior leg cramping with walking. Based on the Short Physical Performance Battery, patient has a frailty rating of 6/12 with </= 5/12 considered frail.   Patient demonstrated the following deficits and impairments:     Visit Diagnosis: Other abnormalities of gait and mobility     Problem List Patient Active Problem List   Diagnosis Date Noted  . Severe aortic stenosis   . Essential hypertension 11/14/2015  . Prostate cancer screening 10/02/2015  . Diabetic ulcer of heel ([place]) 07/14/2014  . PAD (peripheral artery disease) ([place]) 05/24/2014  . Diabetes ([place]) 10/06/2013  . Moderate  nonproliferative diabetic retinopathy ([place]) 10/06/2013  . Cataract associated with type 2 diabetes mellitus ([place]) 10/06/2013  . Chronic kidney disease 08/30/2013  . 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 Obesity (BMI 30-39.9) 07/15/2013  . OSA on CPAP 07/15/2013  . Sleep apnea with use of continuous positive airway pressure (CPAP)   . Obstructive sleep apnea   . Diabetes mellitus type 2, uncomplicated ([place])   . Aortic stenosis 07/25/2011  . Dyspnea 06/26/2011  . Hyperlipidemia 01/10/2011  . CAD (coronary artery disease) 01/10/2011  . CAD, NATIVE VESSEL 01/10/2011  . Atrial fibrillation ([place]) 01/10/2011    [place], PT 11/19/2017, 10:17 AM  [facility] [address] [place], [name], [address] Phone: [phone]   Fax:  [phone]  Name: ALONSO GAPINSKI MRN: [id number] Date of Birth: Jan 18, 1941

## 2017-11-22 ENCOUNTER — Encounter: Payer: Self-pay | Admitting: Surgery

## 2017-11-22 NOTE — Progress Notes (Signed)
Patient ID: Steven Maldonado, male   DOB: 1941-10-23, 77 y.o.   MRN: [id number]  [facility]SULTATION REPORT  Referring Provider is Burnell Blanks* PCP is [name], [name]  Chief Complaint  Patient presents with  . Follow-up    2nd TAVR evaluation    HPI:  The patient is a 77 year old gentleman with a history of insulin dependent type 2 diabetes, hypertension, hyperlipidemia, CAD s/p acute MI in 2012 due to an occluded RCA, stage III CKD, OSA on CPAP, peripheral vascular disease, recurrent persistent atrial fibrillation maintaining sinus on amiodarone after his most recent cardioversion, chronic diastolic heart failure and aortic stenosis. He has been followed by Dr. Angelena Form.  He presents with progression of exertional shortness of breath and fatigue. An echo in 06/2017 showed a mean gradient of 26 mm Hg with a DI of 0.19. The AVA was 0.8 cm2 with an LVEF of 50-55%. Cardiac cath in 09/2017 showed a 60% proximal LAD stenosis that was not felt to be flow limiting with chronic occlusion of the RCA. He underwent workup for consideration of TAVR and has been seen by Dr. Roxy Manns.  He is here with his wife today. He is retired as a Development worker, community. He has been fairly active in retirement but is now limited by his exertional shortness of breath and fatigue. He also has diabetic neuropathy in his feet that limits his ambulation. He denies any rest symptoms. He has not had any chest discomfort and no dizziness or syncope.  Past Medical History:  Diagnosis Date  . Aortic stenosis    severe  . Cataract associated with type 2 diabetes mellitus ([place]) 10/06/2013  . CKD (chronic kidney disease), stage III ([place])   . Coronary artery disease    a. Cath February 2012 in [facility], occluded RCA with collaterals  . Diabetic ulcer of heel ([place]) 07/14/2014  . DM type 2 (diabetes mellitus, type 2) ([place])   . Essential  hypertension   . Hyperkalemia   . Hyperlipidemia   . NSTEMI (non-ST elevated myocardial infarction) ([place]) 12/2010  . Obstructive sleep apnea    2012  . Persistent atrial fibrillation ([place]) 2012   multiple prior cardioversions  . PVD (peripheral vascular disease) ([place])    a. s/p R popliteal artery stenosis tx with drug-coated balloon 05/2014, followed by Dr. Fletcher Anon.  . Skin cancer    "cut off back of neck X 2; off left upper arm; right wrist, near right shoulder blade; several burned off other areas of my body" (06/08/2014)  . Sleep apnea with use of continuous positive airway pressure (CPAP)    04-11-11 AHI was 32.9 and titrated to 15 cm H20, DME is [facility]  . Subclinical hypothyroidism     Past Surgical History:  Procedure Laterality Date  . ABDOMINAL ANGIOGRAM N/A 06/08/2014   Procedure: ABDOMINAL ANGIOGRAM;  Surgeon: Wellington Hampshire, MD;  Location: [facility] CATH LAB;  Service: Cardiovascular;  Laterality: N/A;  . APPENDECTOMY  1965  . CARDIAC CATHETERIZATION  12/2010  . CARDIOVERSION  07/2011  . CARDIOVERSION N/A 04/18/2014   Procedure: CARDIOVERSION;  Surgeon: Dorothy Spark, MD;  Location: [facility] ENDOSCOPY;  Service: Cardiovascular;  Laterality: N/A;  . CARDIOVERSION N/A 11/03/2015   Procedure: CARDIOVERSION;  Surgeon: Lelon Perla, MD;  Location: [facility] ENDOSCOPY;  Service: Cardiovascular;  Laterality: N/A;  . CARDIOVERSION N/A 05/08/2017   Procedure: CARDIOVERSION;  Surgeon: Dorothy Spark, MD;  Location: [place];  Service: Cardiovascular;  Laterality: N/A;  . CARDIOVERSION N/A 07/28/2017   Procedure: CARDIOVERSION;  Surgeon: Dorothy Spark, MD;  Location: [place];  Service: Cardiovascular;  Laterality: N/A;  . LOWER EXTREMITY ANGIOGRAM N/A 06/08/2014   Procedure: LOWER EXTREMITY ANGIOGRAM;  Surgeon: Wellington Hampshire, MD;  Location: [place] CATH LAB;  Service: Cardiovascular;  Laterality: N/A;  . POPLITEAL ARTERY ANGIOPLASTY Right 06/08/2014   Archie Endo 06/08/2014  . RIGHT/LEFT HEART  CATH AND CORONARY ANGIOGRAPHY N/A 10/08/2017   Procedure: RIGHT/LEFT HEART CATH AND CORONARY ANGIOGRAPHY;  Surgeon: Burnell Blanks, MD;  Location: [place] CV LAB;  Service: Cardiovascular;  Laterality: N/A;  . SKIN CANCER EXCISION Bilateral    "have had them cut off back of neck X 2; off left upper arm; right wrist, near right shoulder blade" (06/08/2014)    Family History  Problem Relation Age of Onset  . Diabetes Mother   . Heart attack Mother   . Hypertension Mother   . Heart attack Father   . Heart failure Father   . Hypertension Father   . Diabetes Father   . Diabetes Sister   . Diabetes Brother   . Diabetes Other   . Diabetes Daughter        TYPE ll  . Heart Problems Daughter   . Hypertension Sister   . Hypertension Brother   . Stroke Brother     Social History   Socioeconomic History  . Marital status: Married    Spouse name: Steven Maldonado  . Number of children: 1  . Years of education: 28  . Highest education level: Not on file  Social Needs  . Financial resource strain: Not on file  . Food insecurity - worry: Not on file  . Food insecurity - inability: Not on file  . Transportation needs - medical: Not on file  . Transportation needs - non-medical: Not on file  Occupational History  . Occupation: retired    Fish farm manager: [employer]  Tobacco Use  . Smoking status: Former Smoker    Packs/day: 2.00    Years: 14.00    Pack years: 28.00    Types: Cigarettes    Last attempt to quit: 11/11/1972    Years since quitting: 45.0  . Smokeless tobacco: Never Used  Substance and Sexual Activity  . Alcohol use: No    Comment: quit in 1984  . Drug use: No  . Sexual activity: Not Currently  Other Topics Concern  . 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   Patient has one child and his wife has one child.   Patient is retired.   Patient has a high school education.   Patient is right-handed.    Patient drinks very little caffeine.    Current Outpatient Medications  Medication Sig Dispense Refill  . amiodarone (PACERONE) 200 MG tablet Take 1 tablet (200 mg total) by mouth daily. 90 tablet 3  . aspirin EC 81 MG tablet Take 81 mg by mouth daily.    [name] Kitchen atorvastatin (LIPITOR) 80 MG tablet TAKE 1 TABLET EVERY DAY AT BEDTIME 90 tablet 0  . BD PEN NEEDLE NANO U/F 32G X 4 MM MISC USE 4 (FOUR) TIMES DAILY. 400 each 1  . Blood Glucose Monitoring Suppl (FREESTYLE LITE) DEVI 1 each by Does not apply route 2 (two) times daily. 1 each 0  . cefdinir (OMNICEF) 300 MG capsule Take 1 capsule (300  mg total) by mouth 2 (two) times daily. 20 capsule 0  . Choline Fenofibrate 135 MG capsule TAKE 1 CAPSULE DAILY 90 capsule 3  . docusate sodium (COLACE) 100 MG capsule Take 100 mg daily by mouth.     . fesoterodine (TOVIAZ) 4 MG TB24 tablet Take 4 mg by mouth daily.    . furosemide (LASIX) 20 MG tablet Take 1 tablet (20 mg total) by mouth daily. 90 tablet 3  . glipiZIDE (GLUCOTROL XL) 10 MG 24 hr tablet TAKE 1 TABLET (10 MG TOTAL) BY MOUTH DAILY WITH BREAKFAST. 30 tablet 5  . glucose blood (FREESTYLE LITE) test strip 1 each by Other route 4 (four) times daily -  before meals and at bedtime. 450 each 3  . Insulin Glargine (BASAGLAR KWIKPEN) 100 UNIT/ML SOPN Inject 0.65 mLs (65 Units total) into the skin at bedtime. 30 mL 0  . insulin lispro (HUMALOG) 100 UNIT/ML KiwkPen Inject 0.05 mLs (5 Units total) into the skin 3 (three) times daily. 15 mL 3  . levothyroxine (SYNTHROID) 25 MCG tablet Take 1 tablet by mouth daily for 3 weeks. Then increase to 2 tablets daily 60 tablet 1  . magnesium oxide (MAG-OX) 400 MG tablet TAKE 1 TABLET (400 MG TOTAL) BY MOUTH DAILY. 30 tablet 11  . metoprolol tartrate (LOPRESSOR) 50 MG tablet Take one and one-half tablet by mouth twice a day (Patient taking differently: Take 75 mg by mouth 2 (two) times daily. ) 270 tablet 3  . MYRBETRIQ 25 MG TB24 tablet TAKE 1 TABLET BY MOUTH EVERY DAY  90 tablet 0  . PRADAXA 150 MG CAPS capsule TAKE 1 CAPSULE EVERY 12    HOURS 180 capsule 3  . CINNAMON PO Take 1 tablet by mouth 2 (two) times daily.    . diphenhydramine-acetaminophen (TYLENOL PM) 25-500 MG TABS tablet Take 1 tablet by mouth at bedtime as needed (sleep).    [name] Kitchen guaifenesin (ROBITUSSIN) 100 MG/5ML syrup Take 200 mg by mouth 3 (three) times daily as needed for cough.     No current facility-administered medications for this visit.     No Known Allergies    Review of Systems:   General:  normal appetite, decreased energy, no weight gain, no weight loss, no fever  Cardiac:  no chest pain with exertion, no chest pain at rest, has SOB with moderate exertion, no resting SOB, no PND, no orthopnea, no palpitations, has arrhythmia, has atrial fibrillation, no LE edema, no dizzy spells, no syncope  Respiratory:  exertional shortness of breath, no home oxygen, no productive cough, no dry cough, no bronchitis, no wheezing, no hemoptysis, no asthma, no pain with inspiration or cough, has sleep apnea, uses CPAP at night  GI:   no difficulty swallowing, no reflux, no frequent heartburn, no hiatal hernia, no abdominal pain, on constipation, no diarrhea, no hematochezia, no hematemesis, no melena  GU:   no dysuria,  no frequency, no urinary tract infection, no hematuria, no enlarged prostate, no kidney stones, chronic kidney disease  Vascular:  no pain suggestive of claudication, has pain in feet, no leg cramps, no varicose veins, no DVT, no non-healing foot ulcer  Neuro:   no stroke, no TIA's, no seizures, no headaches, no temporary blindness one eye,  no slurred speech, has peripheral neuropathy, has chronic pain, no instability of gait, no memory/cognitive dysfunction  Musculoskeletal: no arthritis, no joint swelling, no myalgias, mild difficulty walking, normal mobility   Skin:   no rash, no itching, no skin infections,  no pressure sores or ulcerations  Psych:   no anxiety, no depression, no  nervousness, no unusual recent stress  Eyes:   has blurry vision, no floaters, has recent vision changes, needs cataract surgery,  he wears glasses   ENT:   no hearing loss, no loose or painful teeth, full dentures, last saw dentist many years ago  Hematologic:  no easy bruising, no abnormal bleeding, no clotting disorder, no frequent epistaxis  Endocrine:  has diabetes, does check CBG's at home       Physical Exam:   BP 132/75 (BP Location: Left Arm, Patient Position: Sitting, Cuff Size: Large)   Pulse 84   Resp 20   Ht 5\' 11"  (1.803 m)   Wt 255 lb 3.2 oz (115.8 kg)   SpO2 98% Comment: on RA  BMI 35.59 kg/m   General:  Obese but well-appearing  HEENT:  Unremarkable, NCAT, PERLA, EOMI, oropharynx clear  Neck:   no JVD, no bruits, no adenopathy or thyromegaly  Chest:   clear to auscultation, symmetrical breath sounds, no wheezes, no rhonchi   CV:   RRR, grade III/VI crescendo/decrescendo murmur heard best at RSB,  no diastolic murmur  Abdomen:  soft, non-tender, no masses or organomegaly  Extremities:  warm, well-perfused, pulses diminished, no LE edema  Rectal/GU  Deferred  Neuro:   Grossly non-focal and symmetrical throughout  Skin:   Clean and dry, no rashes, no breakdown   Diagnostic Tests:    [facility]                        [address],  [address]                            [phone]  ------------------------------------------------------------------- Transthoracic Echocardiography  Patient:    Kutler, Vanvranken MR #:       [id number] Study Date: 06/16/2017 Gender:     M Age:        44 Height:     180.3 cm Weight:     114.3 kg BSA:        2.43 m^2 Pt. Status: Room:   SONOGRAPHER  Wyatt Mage, RDCS  ATTENDING    Sonny Dandy, Christopher  REFERRING    McAlhany, Christopher  PERFORMING   [facility],  Outpatient  cc:  ------------------------------------------------------------------- LV EF: 50% -   55%  ------------------------------------------------------------------- Indications:      Aortic stenosis (I35).  ------------------------------------------------------------------- History:   PMH:   Dyspnea.  Atrial fibrillation.  Coronary artery disease.  Aortic valve disease.  Risk factors:  OSA on CPAP. CKD. Former tobacco use. Hypertension. Diabetes mellitus. Dyslipidemia.   ------------------------------------------------------------------- Study Conclusions  - Procedure narrative: Transthoracic echocardiography. Image   quality was suboptimal. The study was technically difficult, as a   result of poor acoustic windows, poor sound wave transmission,   and body habitus. Intravenous contrast (Definity) was   administered. - Left ventricle: The cavity size was normal. Wall thickness was   normal. Systolic function was normal. The estimated ejection   fraction was in the range of 50% to 55%. The study is not   technically sufficient to allow evaluation of LV diastolic   function. - Aortic valve: Heavily calcified aortic valve  with severe   stenosis. Mean gradient (S): 26 mm Hg. Peak gradient (S): 45 mm   Hg. Valve area (VTI): 0.81 cm^2. Valve area (Vmax): 0.92 cm^2.   Valve area (Vmean): 0.81 cm^2. - Mitral valve: Calcified annulus. Mildly thickened leaflets . - Left atrium: Moderately dilated.  Impressions:  - Technically difficult study. LVEF lower at 50-55%. A-fib with RVR   is present. There is likely severe aortic stenosis, however, the   mean aortic valve gradient is lower in this study compared to the   prior study, but calculated AVA is still around 0.9 cm2 based on   an LVOT diameter of 2.3 cm.  ------------------------------------------------------------------- Labs, prior tests, procedures, and surgery: Transthoracic echocardiography (11/19/2016).     The aortic valve showed moderate to severe stenosis.  EF was 60%. Aortic valve: peak gradient of 60 mm Hg and mean gradient of 32 mm Hg.  ------------------------------------------------------------------- Study data:  Comparison was made to the study of 11/19/2016.  Study status:  Routine.  Procedure:  The patient reported no pain pre or post test. Transthoracic echocardiography. Image quality was suboptimal. The study was technically difficult, as a result of poor acoustic windows, poor sound wave transmission, and body habitus. Intravenous contrast (Definity) was administered.  Study completion:  There were no complications.          Transthoracic echocardiography.  M-mode, complete 2D, spectral Doppler, and color Doppler.  Birthdate:  Patient birthdate: 08-16-1941.  Age:  Patient is 77 yr old.  Sex:  Gender: male.    BMI: 35.2 kg/m^2.  Blood pressure:     132/82  Patient status:  Outpatient.  Study date: Study date: 06/16/2017. Study time: 01:47 PM.  Location:  [facility]  -------------------------------------------------------------------  ------------------------------------------------------------------- Left ventricle:  The cavity size was normal. Wall thickness was normal. Systolic function was normal. The estimated ejection fraction was in the range of 50% to 55%. The study is not technically sufficient to allow evaluation of LV diastolic function.  ------------------------------------------------------------------- Aortic valve:  Heavily calcified aortic valve with severe stenosis.  Doppler:     VTI ratio of LVOT to aortic valve: 0.19. Valve area (VTI): 0.81 cm^2. Indexed valve area (VTI): 0.33 cm^2/m^2. Peak velocity ratio of LVOT to aortic valve: 0.22. Valve area (Vmax): 0.92 cm^2. Indexed valve area (Vmax): 0.38 cm^2/m^2. Mean velocity ratio of LVOT to aortic valve: 0.2. Valve area (Vmean): 0.81 cm^2. Indexed valve area (Vmean): 0.33 cm^2/m^2.    Mean  gradient (S): 26 mm Hg. Peak gradient (S): 45 mm Hg.  ------------------------------------------------------------------- Aorta:  Aortic root: The aortic root was normal in size. Ascending aorta: The ascending aorta was normal in size.  ------------------------------------------------------------------- Mitral valve:   Calcified annulus. Mildly thickened leaflets . Doppler:  There was trivial regurgitation.  ------------------------------------------------------------------- Left atrium:  Moderately dilated.  ------------------------------------------------------------------- Right ventricle:  Poorly visualized.  ------------------------------------------------------------------- Pulmonic valve:    The valve appears to be grossly normal. Doppler:  There was trivial regurgitation.  ------------------------------------------------------------------- Pulmonary artery:   The main pulmonary artery was normal-sized.  ------------------------------------------------------------------- Right atrium:  The atrium was normal in size.  ------------------------------------------------------------------- Pericardium:  There was no pericardial effusion.  ------------------------------------------------------------------- Systemic veins: Inferior vena cava: The vessel was normal in size. The respirophasic diameter changes were in the normal range (>= 50%), consistent with normal central venous pressure.  ------------------------------------------------------------------- Measurements   Left ventricle  Value          Reference  LV ID, ED, PLAX chordal           (H)     54.5  mm       43 - 52  LV ID, ES, PLAX chordal           (H)     43    mm       23 - 38  LV fx shortening, PLAX chordal    (L)     21    %        >=29  LV PW thickness, ED                       10.9  mm       ---------  IVS/LV PW ratio, ED                       0.95           <=1.3   Stroke volume, 2D                         49    ml       ---------  Stroke volume/bsa, 2D                     20    ml/m^2   ---------    Ventricular septum                        Value          Reference  IVS thickness, ED                         10.4  mm       ---------    LVOT                                      Value          Reference  LVOT ID, S                                23    mm       ---------  LVOT area                                 4.15  cm^2     ---------  LVOT peak velocity, S                     73.6  cm/s     ---------  LVOT mean velocity, S                     45.7  cm/s     ---------  LVOT VTI, S                               12.9  cm       ---------  Stroke volume (SV), LVOT DP  53.6  ml       ---------  Stroke index (SV/bsa), LVOT DP            22    ml/m^2   ---------    Aortic valve                              Value          Reference  Aortic valve peak velocity, S             334   cm/s     ---------  Aortic valve mean velocity, S             234   cm/s     ---------  Aortic valve VTI, S                       66.2  cm       ---------  Aortic mean gradient, S                   26    mm Hg    ---------  Aortic peak gradient, S                   45    mm Hg    ---------  VTI ratio, LVOT/AV                        0.19           ---------  Aortic valve area, VTI                    0.81  cm^2     ---------  Aortic valve area/bsa, VTI                0.33  cm^2/m^2 ---------  Velocity ratio, peak, LVOT/AV             0.22           ---------  Aortic valve area, peak velocity          0.92  cm^2     ---------  Aortic valve area/bsa, peak               0.38  cm^2/m^2 ---------  velocity  Velocity ratio, mean, LVOT/AV             0.2            ---------  Aortic valve area, mean velocity          0.81  cm^2     ---------  Aortic valve area/bsa, mean               0.33  cm^2/m^2 ---------  velocity    Aorta                                     Value           Reference  Aortic root ID, ED                        37    mm       ---------  Ascending aorta ID, A-P, S  31    mm       ---------    Left atrium                               Value          Reference  LA ID, A-P, ES                            44    mm       ---------  LA ID/bsa, A-P                            1.81  cm/m^2   <=2.2  LA volume, S                              90.6  ml       ---------  LA volume/bsa, S                          37.2  ml/m^2   ---------  LA volume, ES, 1-p A4C                    67.2  ml       ---------  LA volume/bsa, ES, 1-p A4C                27.6  ml/m^2   ---------  LA volume, ES, 1-p A2C                    108   ml       ---------  LA volume/bsa, ES, 1-p A2C                44.4  ml/m^2   ---------    Systemic veins                            Value          Reference  Estimated CVP                             3     mm Hg    ---------  Legend: (L)  and  (H)  mark values outside specified reference range.  ------------------------------------------------------------------- Prepared and Electronically Authenticated by  Lyman Bishop MD 2018-08-06T17:51:42  Procedures   RIGHT/LEFT HEART CATH AND CORONARY ANGIOGRAPHY  Conclusion     Prox RCA lesion is 100% stenosed.  Ost LAD to Prox LAD lesion is 60% stenosed.  There is severe aortic valve stenosis.  Hemodynamic findings consistent with mild pulmonary hypertension.   1. Chronic total occlusion of the RCA. The mid and distal RCA fills from left to right collaterals.  2. Moderate stenosis proximal LAD. This does not appear to be flow limiting.  3. Severe aortic valve stenosis (mean gradient 41.8 mmHg, peak to peak gradient 44 mmHg, AVA 0.79 cm2)  Recommendations: Will continue workup for TAVR.    Indications   Severe aortic stenosis [I35.0 (ICD-10-CM)]  Procedural Details/Technique   Technical Details Indication: 77 yo male with h/o CAD, DM, HTN and severe AS here  today for cardiac cath in workup for TAVR  Procedure: The risks, benefits, complications, treatment options, and expected outcomes were discussed with the patient. The patient and/or family concurred with the proposed plan, giving informed consent. The patient was brought to the cath lab after IV hydration was given. The patient was further sedated with Versed and Fentanyl. The right groin was prepped and draped in the usual manner. Using the modified Seldinger access technique, a 5 French sheath was placed in the right femoral artery. A 7 French sheath was placed in the right femoral vein. A right heart catheterization was performed with a balloon tipped catheter. Standard diagnostic catheters were used to perform selective coronary angiography. I crossed the aortic valve with an AL-1 and a straight wire. LV pressures measured.   There were no immediate complications. The patient was taken to the recovery area in stable condition.    Estimated blood loss <50 mL.  During this procedure the patient was administered the following to achieve and maintain moderate conscious sedation: Versed 3 mg, Fentanyl 75 mcg, while the patient's heart rate, blood pressure, and oxygen saturation were continuously monitored. The period of conscious sedation was 60 minutes, of which I was present face-to-face 100% of this time.  Complications   Complications documented before study signed (10/08/2017 10:05 AM EST)    RIGHT/LEFT HEART CATH AND CORONARY ANGIOGRAPHY   None Documented by Burnell Blanks, MD 10/08/2017 10:03 AM EST  Time Range: Intra-procedure      Coronary Findings   Diagnostic  Dominance: Right  Left Anterior Descending  Ost LAD to Prox LAD lesion 60% stenosed  Ost LAD to Prox LAD lesion is 60% stenosed.  First Diagonal Branch  Vessel is moderate in size.  Second Diagonal Branch  Vessel is small in size.  Left Circumflex  Second Obtuse Marginal Branch  Vessel is large in size.   Right Coronary Artery  Prox RCA lesion 100% stenosed  Prox RCA lesion is 100% stenosed. The lesion is chronically occluded.  Right Posterior Atrioventricular Branch  Collaterals  Post Atrio filled by collaterals from Dist Cx.    Intervention   No interventions have been documented.  Right Heart   Right Heart Pressures Hemodynamic findings consistent with mild pulmonary hypertension. Elevated LV EDP consistent with volume overload.  Left Heart   Aortic Valve There is severe aortic valve stenosis. The aortic valve is calcified.  Coronary Diagrams   Diagnostic Diagram       Implants     No implant documentation for this case.  MERGE Images   Show images for CARDIAC CATHETERIZATION   Link to Procedure Log   Procedure Log    Hemo Data    Most Recent Value  Fick Cardiac Output 5.22 L/min  Fick Cardiac Output Index 2.23 (L/min)/BSA  Aortic Mean Gradient 41.8 mmHg  Aortic Peak Gradient 44 mmHg  Aortic Valve Area 0.79  Aortic Value Area Index 0.34 cm2/BSA  RA A Wave 14 mmHg  RA V Wave 12 mmHg  RA Mean 10 mmHg  RV Systolic Pressure 54 mmHg  RV Diastolic Pressure 7 mmHg  RV EDP 14 mmHg  PA Systolic Pressure 45 mmHg  PA Diastolic Pressure 17 mmHg  PA Mean 30 mmHg  PW A Wave 25 mmHg  PW V Wave 24 mmHg  PW Mean 22 mmHg  AO Systolic Pressure 381 mmHg  AO Diastolic Pressure 52 mmHg  AO Mean 75 mmHg  LV Systolic Pressure 017 mmHg  LV Diastolic Pressure 12 mmHg  LV EDP 25 mmHg  Arterial Occlusion  Pressure Extended Systolic Pressure 810 mmHg  Arterial Occlusion Pressure Extended Diastolic Pressure 62 mmHg  Arterial Occlusion Pressure Extended Mean Pressure 94 mmHg  Left Ventricular Apex Extended Systolic Pressure 175 mmHg  Left Ventricular Apex Extended Diastolic Pressure 12 mmHg  Left Ventricular Apex Extended EDP Pressure 29 mmHg  QP/QS 1  TPVR Index 13.45 HRUI  TSVR Index 33.64 HRUI  PVR SVR Ratio 0.12  TPVR/TSVR Ratio 0.4    ADDENDUM REPORT: 11/11/2017  16:32  CLINICAL DATA:  77 year old male with severe aortic stenosis being evaluated for a TAVR procedure.  EXAM: Cardiac TAVR CT  TECHNIQUE: The patient was scanned on a [employer]. A 120 kV retrospective scan was triggered in the descending thoracic aorta at 111 HU's. Gantry rotation speed was 250 msecs and collimation was .6 mm. No beta blockade or nitro were given. The 3D data set was reconstructed in 5% intervals of the R-R cycle. Systolic and diastolic phases were analyzed on a dedicated work station using MPR, MIP and VRT modes. The patient received 80 cc of contrast.  FINDINGS: Aortic Valve: Trileaflet, moderately thickened and calcified aortic valve with severely restricted leaflet opening. Mild calcifications are extending into the LVOT under the left coronary cusp.  Aorta: Normal caliber, moderate diffuse calcifications, no dissection.  Sinotubular Junction:  31 x 30 mm  Ascending Thoracic Aorta:  32 x 32 mm  Aortic Arch:  26 x 26 mm  Descending Thoracic Aorta:  24 x 23 mm  Sinus of Valsalva Measurements:  Non-coronary:  32 mm  Right -coronary:  35 mm  Left -coronary:  36 mm  Coronary Artery Height above Annulus:  Left Main:  17 mm  Right Coronary:  19 mm  Virtual Basal Annulus Measurements:  Maximum/Minimum Diameter:  29.1 x 23.9 mm  Mean Diameter:  26 mm  Perimeter:  83 mm  Area:  530 mm2  Optimum Fluoroscopic Angle for Delivery:  RAO 2 CRA 2  IMPRESSION: 1. Trileaflet, moderately thickened and calcified aortic valve with severely restricted leaflet opening. Mild calcifications are extending into the LVOT under the left coronary cusp. Annular measurements suitable for delivery of a 26 mm Edwards-SAPIEN 3 valve.  2. Sufficient coronary to annulus distance.  3. Optimum Fluoroscopic Angle for Delivery: RAO 2 CRA 2  4. No thrombus in the left atrial appendage.   Electronically Signed   By:  Ena Dawley   On: 11/11/2017 16:32  CLINICAL DATA:  77 year old male with history of severe aortic stenosis. Preprocedural study prior to potential transcatheter aortic valve replacement (TAVR) procedure.  EXAM: CT ANGIOGRAPHY CHEST, ABDOMEN AND PELVIS  TECHNIQUE: Multidetector CT imaging through the chest, abdomen and pelvis was performed using the standard protocol during bolus administration of intravenous contrast. Multiplanar reconstructed images and MIPs were obtained and reviewed to evaluate the vascular anatomy.  CONTRAST:  150mL ISOVUE-370 IOPAMIDOL (ISOVUE-370) INJECTION 76%, 15mL ISOVUE-370 IOPAMIDOL (ISOVUE-370) INJECTION 76%  COMPARISON:  No priors.  FINDINGS: CTA CHEST FINDINGS  Cardiovascular: Heart size is normal. There is no significant pericardial fluid, thickening or pericardial calcification. There is aortic atherosclerosis, as well as atherosclerosis of the great vessels of the mediastinum and the coronary arteries, including calcified atherosclerotic plaque in the left main, left anterior descending and right coronary arteries. Severe thickening calcification of the aortic valve. Severe calcifications of the mitral annulus.  Mediastinum/Lymph Nodes: Multiple prominent borderline enlarged mediastinal and bilateral hilar lymph nodes are noted measuring up to 11 mm in short axis in the right hilar region,  nonspecific. Esophagus is unremarkable in appearance. No axillary lymphadenopathy.  Lungs/Pleura: 5 mm pulmonary nodule in the left lower lobe (axial image 72 of series 15). No larger more suspicious appearing pulmonary nodules or masses are noted. No acute consolidative airspace disease. No pleural effusions.  Musculoskeletal/Soft Tissues: There are no aggressive appearing lytic or blastic lesions noted in the visualized portions of the skeleton.  CTA ABDOMEN AND PELVIS FINDINGS  Hepatobiliary: No suspicious cystic or solid  hepatic lesions. No intra or extrahepatic biliary ductal dilatation. Gallbladder is normal in appearance.  Pancreas: No pancreatic mass. No pancreatic ductal dilatation. No pancreatic or peripancreatic fluid or inflammatory changes.  Spleen: Unremarkable.  Adrenals/Urinary Tract: 1.6 cm low-attenuation lesion in the interpolar region of the left kidney is compatible with a simple cyst. Right kidney and bilateral adrenal glands are normal in appearance. No hydroureteronephrosis. Urinary bladder is normal in appearance.  Stomach/Bowel: The appearance of the stomach is normal. There is no pathologic dilatation of small bowel or colon. The appendix is not confidently identified and may be surgically absent. Regardless, there are no inflammatory changes noted adjacent to the cecum to suggest the presence of an acute appendicitis at this time.  Vascular/Lymphatic: Aortic atherosclerosis with vascular findings and measurements pertinent to potential TAVR procedure, as detailed below. No aneurysm or dissection noted in the abdominal or pelvic vasculature. Celiac axis and superior mesenteric artery are widely patent without hemodynamically significant stenosis. Mild narrowing at the ostium of the inferior mesenteric artery, of questionable hemodynamic significance. Bilateral renal artery is are both widely patent without hemodynamically significant stenosis. No lymphadenopathy identified in the abdomen or pelvis.  Reproductive: Prostate gland is enlarged and heterogeneous in appearance. Seminal vesicles are unremarkable in appearance.  Other: No significant volume of ascites.  No pneumoperitoneum.  Musculoskeletal: There are no aggressive appearing lytic or blastic lesions noted in the visualized portions of the skeleton.  VASCULAR MEASUREMENTS PERTINENT TO TAVR:  AORTA:  Minimal Aortic Diameter -  13 x 12 mm  Severity of Aortic Calcification -  severe  RIGHT  PELVIS:  Right Common Iliac Artery -  Minimal Diameter - 8.4 x 7.1 mm  Tortuosity - mild  Calcification - moderate  Right External Iliac Artery -  Minimal Diameter - 9.4 x 8.6 mm  Tortuosity - mild  Calcification - none  Right Common Femoral Artery -  Minimal Diameter - 7.2 x 8.6 mm  Tortuosity - mild  Calcification - mild  LEFT PELVIS:  Left Common Iliac Artery -  Minimal Diameter - 7.8 x 8.0 mm  Tortuosity - mild  Calcification - moderate  Left External Iliac Artery -  Minimal Diameter - 9.0 x 8.4 mm  Tortuosity - mild  Calcification - mild  Left Common Femoral Artery -  Minimal Diameter - 8.0 x 5.9 mm  Tortuosity - mild  Calcification - mild  Review of the MIP images confirms the above findings.  IMPRESSION: 1. Vascular findings and measurements pertinent to potential TAVR procedure, as detailed above. 2. Severe thickening calcification of the aortic valve, compatible with the reported clinical history of severe aortic stenosis. 3. Aortic atherosclerosis, in addition to left main and 2 vessel coronary artery disease. Assessment for potential risk factor modification, dietary therapy or pharmacologic therapy may be warranted, if clinically indicated. 4. Additional incidental findings, as above. Aortic Atherosclerosis (ICD10-I70.0).   Electronically Signed   By: Vinnie Langton M.D.   On: 10/31/2017 16:06   Impression:  This 77 year old gentleman has stage D,  severe, symptomatic aortic stenosis with NYHA class II symptoms of exertional shortness of breath and fatigue consistent with chronic diastolic heart failure. He has a chronically occluded RCA and a 60% LAD stenosis that is not felt to be flow-limiting with no anginal symptoms. He has recurrent persistent atrial fibrillation but is maintaining sinus on amiodarone since his last cardioversion. I have personally reviewed his echo, cath and CTA studies. His  echo showed a trileaflet aortic valve with severe thickening, calcification and restricted leaflet mobility. The mean gradient was only 26 mm Hg but he was in atrial fibrillation at the time and the valve looks like a severely stenotic valve. The DI was low at 0.19. I agree that AVR is indicated in this patient with progressive symptoms of severe AS. His operative risk for open surgical AVR would be moderately elevated due to his comorbid risk factors. I think TAVR would be a reasonable alternative. His gated cardiac CT shows anatomy suitable for TAVR using a Sapien 3 valve. His abdominal and pelvic CT shows adequate pelvic vasculature for transfemoral insertion.   The patient and his wife were counseled at length regarding treatment alternatives for management of severe symptomatic aortic stenosis. The risks and benefits of surgical intervention has been discussed in detail. Long-term prognosis with medical therapy was discussed. Alternative approaches such as conventional surgical aortic valve replacement, transcatheter aortic valve replacement, and palliative medical therapy were compared and contrasted at length. This discussion was placed in the context of the patient's own specific clinical presentation and past medical history. All of their questions have been addressed. The patient is eager to proceed with TAVR.   The patient has been advised of a variety of complications that might develop including but not limited to risks of death, stroke, paravalvular leak, aortic dissection or other major vascular complications, aortic annulus rupture, device embolization, cardiac rupture or perforation, mitral regurgitation, acute myocardial infarction, arrhythmia, heart block or bradycardia requiring permanent pacemaker placement, congestive heart failure, respiratory failure, renal failure, pneumonia, infection, other late complications related to structural valve deterioration or migration, or other  complications that might ultimately cause a temporary or permanent loss of functional independence or other long term morbidity. The patient provides full informed consent for the procedure as described and all questions were answered.     Plan:  Transfemoral TAVR   I spent 45 minutes performing this consultation and > 50% of this time was spent face to face counseling and coordinating the care of this patient's severe aortic stenosis.  Gaye Pollack, MD 11/19/2017

## 2017-11-27 ENCOUNTER — Ambulatory Visit: Payer: Self-pay | Admitting: Physician Assistant

## 2017-11-27 NOTE — Pre-Procedure Instructions (Signed)
Steven Maldonado  11/27/2017      [id number] [name], [name] - [name] [address] Phone: [phone] Fax: [phone]    Your procedure is scheduled on December 02, 2017.  Report to [name] Admitting at 0530 AM .  Call this number if you have problems the morning of surgery:  [phone]   Remember:  Do not eat food or drink liquids after midnight.  Take these medicines the morning of surgery with A SIP OF WATER (none).  Stop Pradaxa as instructed by your surgeon  Stop taking any Aleve, Naproxen, Ibuprofen, Motrin, Advil, Goody's, BC's, all herbal medications, fish oil, and all vitamins  Continue all other medications as instructed by your physician except follow the above medication instructions before surgery   WHAT DO I DO ABOUT MY DIABETES MEDICATION?  [name] Kitchen Do not take oral diabetes medicines (pills) the morning of surgery glipizide (glucotrol).  . THE NIGHT BEFORE SURGERY, take 32 units of lantus insulin and your normal dose of humalog insulin with dinner.     . THE MORNING OF SURGERY, take  units of no insulin unless your CBG is greater than 220 mg/dL, you may take  of your sliding scale (correction) dose of insulin (2 units of humalog insulin).  . The day of surgery, do not take other diabetes injectables, including Byetta (exenatide), Bydureon (exenatide ER), Victoza (liraglutide), or Trulicity (dulaglutide).  . If your CBG is greater than 220 mg/dL, you may take  of your sliding scale (correction) dose of insulin.   How to Manage Your Diabetes Before and After Surgery  Why is it important to control my blood sugar before and after surgery? . Improving blood sugar levels before and after surgery helps healing and can limit problems. . A way of improving blood sugar control is eating a healthy diet by: o  Eating less sugar and carbohydrates o  Increasing activity/exercise o  Talking with  your doctor about reaching your blood sugar goals . High blood sugars (greater than 180 mg/dL) can raise your risk of infections and slow your recovery, so you will need to focus on controlling your diabetes during the weeks before surgery. . Make sure that the doctor who takes care of your diabetes knows about your planned surgery including the date and location.  How do I manage my blood sugar before surgery? . Check your blood sugar at least 4 times a day, starting 2 days before surgery, to make sure that the level is not too high or low. o Check your blood sugar the morning of your surgery when you wake up and every 2 hours until you get to the Short Stay unit. . If your blood sugar is less than 70 mg/dL, you will need to treat for low blood sugar: o Do not take insulin. o Treat a low blood sugar (less than 70 mg/dL) with  cup of clear juice (cranberry or apple), 4 glucose tablets, OR glucose gel. Recheck blood sugar in 15 minutes after treatment (to make sure it is greater than 70 mg/dL). If your blood sugar is not greater than 70 mg/dL on recheck, call [phone] o  for further instructions. . Report your blood sugar to the short stay nurse when you get to Short Stay.  . If you are admitted to the hospital after surgery: o Your blood sugar will be checked by the staff and you will probably be given insulin after  surgery (instead of oral diabetes medicines) to make sure you have good blood sugar levels. o The goal for blood sugar control after surgery is 80-180 mg/dL   Reviewed and Endorsed by [facility] Patient Education Committee, August 2015   Do not wear jewelry.  Do not wear lotions, powders, or colognes, or deodorant.  Men may shave face and neck.  Do not bring valuables to the hospital.  [facility] is not responsible for any belongings or valuables.  Contacts, dentures or bridgework may not be worn into surgery.  Leave your suitcase in the car.  After surgery it may be  brought to your room.  For patients admitted to the hospital, discharge time will be determined by your treatment team.  Patients discharged the day of surgery will not be allowed to drive home.   Special instructions:   [name]- Preparing For Surgery  Before surgery, you can play an important role. Because skin is not sterile, your skin needs to be as free of germs as possible. You can reduce the number of germs on your skin by washing with CHG (chlorahexidine gluconate) Soap before surgery.  CHG is an antiseptic cleaner which kills germs and bonds with the skin to continue killing germs even after washing.  Please do not use if you have an allergy to CHG or antibacterial soaps. If your skin becomes reddened/irritated stop using the CHG.  Do not shave (including legs and underarms) for at least 48 hours prior to first CHG shower. It is OK to shave your face.  Please follow these instructions carefully.   1. Shower the NIGHT BEFORE SURGERY and the MORNING OF SURGERY with CHG.   2. If you chose to wash your hair, wash your hair first as usual with your normal shampoo.  3. After you shampoo, rinse your hair and body thoroughly to remove the shampoo.  4. Use CHG as you would any other liquid soap. You can apply CHG directly to the skin and wash gently with a scrungie or a clean washcloth.   5. Apply the CHG Soap to your body ONLY FROM THE NECK DOWN.  Do not use on open wounds or open sores. Avoid contact with your eyes, ears, mouth and genitals (private parts). Wash Face and genitals (private parts)  with your normal soap.  6. Wash thoroughly, paying special attention to the area where your surgery will be performed.  7. Thoroughly rinse your body with warm water from the neck down.  8. DO NOT shower/wash with your normal soap after using and rinsing off the CHG Soap.  9. Pat yourself dry with a CLEAN TOWEL.  10. Wear CLEAN PAJAMAS to bed the night before surgery, wear comfortable  clothes the morning of surgery  11. Place CLEAN SHEETS on your bed the night of your first shower and DO NOT SLEEP WITH PETS.  Day of Surgery: Do not apply any deodorants/lotions. Please wear clean clothes to the hospital/surgery center.    Please read over the following fact sheets that you were given. Pain Booklet, Coughing and Deep Breathing, MRSA Information and Surgical Site Infection Prevention

## 2017-11-28 ENCOUNTER — Encounter (HOSPITAL_COMMUNITY): Payer: Self-pay

## 2017-11-28 ENCOUNTER — Ambulatory Visit (HOSPITAL_COMMUNITY)
Admission: RE | Admit: 2017-11-28 | Discharge: 2017-11-28 | Disposition: A | Discharge status: Home / Self Care | Payer: Medicare Other | Source: Ambulatory Visit | Attending: Cardiovascular Disease | Admitting: Cardiovascular Disease

## 2017-11-28 ENCOUNTER — Other Ambulatory Visit: Payer: Self-pay

## 2017-11-28 ENCOUNTER — Encounter (HOSPITAL_COMMUNITY)
Admission: RE | Admit: 2017-11-28 | Discharge: 2017-11-28 | Disposition: A | Discharge status: Home / Self Care | Payer: Medicare Other | Source: Ambulatory Visit | Attending: Cardiovascular Disease | Admitting: Cardiovascular Disease

## 2017-11-28 DIAGNOSIS — I2510 Atherosclerotic heart disease of native coronary artery without angina pectoris: Secondary | ICD-10-CM | POA: Diagnosis not present

## 2017-11-28 DIAGNOSIS — I350 Nonrheumatic aortic (valve) stenosis: Secondary | ICD-10-CM | POA: Diagnosis not present

## 2017-11-28 DIAGNOSIS — N183 Chronic kidney disease, stage 3 (moderate): Secondary | ICD-10-CM | POA: Diagnosis not present

## 2017-11-28 DIAGNOSIS — I252 Old myocardial infarction: Secondary | ICD-10-CM | POA: Diagnosis not present

## 2017-11-28 DIAGNOSIS — Z794 Long term (current) use of insulin: Secondary | ICD-10-CM | POA: Insufficient documentation

## 2017-11-28 DIAGNOSIS — G4733 Obstructive sleep apnea (adult) (pediatric): Secondary | ICD-10-CM | POA: Diagnosis not present

## 2017-11-28 DIAGNOSIS — Z79899 Other long term (current) drug therapy: Secondary | ICD-10-CM | POA: Diagnosis not present

## 2017-11-28 DIAGNOSIS — Z95820 Peripheral vascular angioplasty status with implants and grafts: Secondary | ICD-10-CM | POA: Insufficient documentation

## 2017-11-28 DIAGNOSIS — E039 Hypothyroidism, unspecified: Secondary | ICD-10-CM | POA: Insufficient documentation

## 2017-11-28 DIAGNOSIS — Z87891 Personal history of nicotine dependence: Secondary | ICD-10-CM | POA: Diagnosis not present

## 2017-11-28 DIAGNOSIS — I130 Hypertensive heart and chronic kidney disease with heart failure and stage 1 through stage 4 chronic kidney disease, or unspecified chronic kidney disease: Secondary | ICD-10-CM | POA: Insufficient documentation

## 2017-11-28 DIAGNOSIS — I509 Heart failure, unspecified: Secondary | ICD-10-CM | POA: Diagnosis not present

## 2017-11-28 DIAGNOSIS — E1122 Type 2 diabetes mellitus with diabetic chronic kidney disease: Secondary | ICD-10-CM | POA: Insufficient documentation

## 2017-11-28 DIAGNOSIS — I700 Atherosclerosis of aorta: Secondary | ICD-10-CM | POA: Diagnosis not present

## 2017-11-28 DIAGNOSIS — Z7982 Long term (current) use of aspirin: Secondary | ICD-10-CM | POA: Insufficient documentation

## 2017-11-28 DIAGNOSIS — I739 Peripheral vascular disease, unspecified: Secondary | ICD-10-CM | POA: Diagnosis not present

## 2017-11-28 DIAGNOSIS — Z7902 Long term (current) use of antithrombotics/antiplatelets: Secondary | ICD-10-CM | POA: Insufficient documentation

## 2017-11-28 LAB — SURGICAL PCR SCREEN
MRSA, PCR: NEGATIVE
Staphylococcus aureus: NEGATIVE

## 2017-11-28 LAB — TYPE AND SCREEN
ABO/RH(D): A POS
Antibody Screen: NEGATIVE

## 2017-11-28 LAB — URINALYSIS, ROUTINE W REFLEX MICROSCOPIC
Bacteria, UA: NONE SEEN
Bilirubin Urine: NEGATIVE
Glucose, UA: >=500 mg/dL — AB
Hgb urine dipstick: NEGATIVE
Ketones, ur: NEGATIVE mg/dL
Nitrite: NEGATIVE
Protein, ur: NEGATIVE mg/dL
Specific Gravity, Urine: 1.022 (ref 1.005–1.030)
pH: 5 (ref 5.0–8.0)

## 2017-11-28 LAB — CBC
HCT: 38.6 % — ABNORMAL LOW (ref 39.0–52.0)
Hemoglobin: 13.1 g/dL (ref 13.0–17.0)
MCH: 32.7 pg (ref 26.0–34.0)
MCHC: 33.9 g/dL (ref 30.0–36.0)
MCV: 96.3 fL (ref 78.0–100.0)
Platelets: 211 10*3/uL (ref 150–400)
RBC: 4.01 MIL/uL — ABNORMAL LOW (ref 4.22–5.81)
RDW: 13.4 % (ref 11.5–15.5)
WBC: 6.8 10*3/uL (ref 4.0–10.5)

## 2017-11-28 LAB — BLOOD GAS, ARTERIAL
Acid-Base Excess: 0.3 mmol/L (ref 0.0–2.0)
Bicarbonate: 23.6 mmol/L (ref 20.0–28.0)
Drawn by: 449841
FIO2: 21
O2 Saturation: 87.7 %
Patient temperature: 98.6
pCO2 arterial: 32.8 mmHg (ref 32.0–48.0)
pH, Arterial: 7.471 — ABNORMAL HIGH (ref 7.350–7.450)
pO2, Arterial: 53.1 mmHg — ABNORMAL LOW (ref 83.0–108.0)

## 2017-11-28 LAB — PROTIME-INR
INR: 1.02
Prothrombin Time: 13.3 seconds (ref 11.4–15.2)

## 2017-11-28 LAB — APTT: aPTT: 29 seconds (ref 24–36)

## 2017-11-28 LAB — HEMOGLOBIN A1C
Hgb A1c MFr Bld: 8.1 % — ABNORMAL HIGH (ref 4.8–5.6)
Mean Plasma Glucose: 185.77 mg/dL

## 2017-11-28 LAB — ABO/RH: ABO/RH(D): A POS

## 2017-11-28 LAB — BRAIN NATRIURETIC PEPTIDE: B Natriuretic Peptide: 47.9 pg/mL (ref 0.0–100.0)

## 2017-11-28 LAB — GLUCOSE, CAPILLARY: Glucose-Capillary: 187 mg/dL — ABNORMAL HIGH (ref 65–99)

## 2017-11-28 NOTE — Progress Notes (Addendum)
PCp is Dena Billet, [name] Cardiologist is Dr Angelena Form Reports his fasting CBG's run 80-100's Instructed to bring CPAP mask on the day of surgery. - voices understanding. Reports his last dose of pradaxa was 11-27-17 Denies cough, fever or chest pain.  Echo noted 11/19/16 Card cath noted 10/08/17

## 2017-11-28 NOTE — Progress Notes (Addendum)
Message left with Nell Range to inform her of ua results, A1c results and that CMP was hemolyzed. Order was placed to get CMP on day of surgery.

## 2017-11-28 NOTE — Progress Notes (Signed)
Message left with Nell Range, [name] To have her look at the EKG done today.

## 2017-11-28 NOTE — Progress Notes (Signed)
Spoke with Nell Range about EKG, A1c, CMET, and Ua results no new orders.

## 2017-11-28 NOTE — Progress Notes (Signed)
Cmp was hemolyzed new order place to draw Day of surgery.

## 2017-12-01 MED ORDER — NITROGLYCERIN IN D5W 200-5 MCG/ML-% IV SOLN
2.0000 ug/min | INTRAVENOUS | Status: DC
  Filled 2017-12-01: qty 250

## 2017-12-01 MED ORDER — POTASSIUM CHLORIDE 2 MEQ/ML IV SOLN
80.0000 meq | INTRAVENOUS | Status: DC
  Filled 2017-12-01: qty 40

## 2017-12-01 MED ORDER — SODIUM CHLORIDE 0.9 % IV SOLN
30.0000 ug/min | INTRAVENOUS | Status: DC
  Filled 2017-12-01: qty 2

## 2017-12-01 MED ORDER — VANCOMYCIN HCL 10 G IV SOLR
1500.0000 mg | INTRAVENOUS | Status: AC
  Administered 2017-12-02: 1500 mg via INTRAVENOUS
  Filled 2017-12-01: qty 1500

## 2017-12-01 MED ORDER — DEXMEDETOMIDINE HCL IN NACL 400 MCG/100ML IV SOLN
0.1000 ug/kg/h | INTRAVENOUS | Status: DC
  Filled 2017-12-01: qty 100

## 2017-12-01 MED ORDER — SODIUM CHLORIDE 0.9 % IV SOLN
INTRAVENOUS | Status: DC
  Filled 2017-12-01: qty 30

## 2017-12-01 MED ORDER — EPINEPHRINE PF 1 MG/ML IJ SOLN
0.0000 ug/min | INTRAVENOUS | Status: DC
  Filled 2017-12-01: qty 4

## 2017-12-01 MED ORDER — MAGNESIUM SULFATE 50 % IJ SOLN
40.0000 meq | INTRAMUSCULAR | Status: DC
  Filled 2017-12-01: qty 9.85

## 2017-12-01 MED ORDER — NOREPINEPHRINE BITARTRATE 1 MG/ML IV SOLN
0.0000 ug/min | INTRAVENOUS | Status: DC
  Filled 2017-12-01: qty 4

## 2017-12-01 MED ORDER — SODIUM CHLORIDE 0.9 % IV SOLN
INTRAVENOUS | Status: DC
  Filled 2017-12-01: qty 1

## 2017-12-01 MED ORDER — DOPAMINE-DEXTROSE 3.2-5 MG/ML-% IV SOLN
0.0000 ug/kg/min | INTRAVENOUS | Status: DC
  Filled 2017-12-01: qty 250

## 2017-12-01 MED ORDER — DEXTROSE 5 % IV SOLN
1.5000 g | INTRAVENOUS | Status: AC
  Administered 2017-12-02: 1.5 g via INTRAVENOUS
  Filled 2017-12-01 (×2): qty 1.5

## 2017-12-01 NOTE — Progress Notes (Addendum)
Anesthesia Chart Review: Patient is a 77 year old male scheduled for TAVR, transfemoral approach on 12/02/17 by Dr. [name] and Julian surgeon.   History includes former smoker (quit '74), HLD, HTN, CAD/MI '12 (occluded RCA with collaterals), severe AS, afib (s/p multiple DCCV), CHF, dyspnea, PVD (s/p right popliteal stent 05/2014) CKD III, hypothyroidism, DM2, OSA (CPAP), skin cancer  PCP is Dena Billet, [name]. Cardiologist is [name]. PV cardiologist is Dr. [name]. EP cardiologist is Dr. Thompson Grayer (afib). He has been seen by Roderic Palau, NP at the [facility].  Meds include amiodarone, aspirin 81 mg, Lipitor, choline fenofibrate, Toviaz, Lasix, glipizide, insulin glargine, Humalog, levothyroxine, magnesium oxide, Lopressor, Pradaxa (last dose 11/27/17), Myrbetriq.  BP (!) 145/54   Pulse 73   Temp 36.7 C (Oral)   Resp 18   Ht 5\' 11"  (1.803 m)   Wt 251 lb 4 oz (114 kg)   SpO2 100%   BMI 35.04 kg/m   EKG 11/28/17: Aflutter at 76 bpm, non-specific intraventricular conduction block, inferior infarct, age undetermined, T wave abnormality, consider lateral ischemia.  Possible flutter P waves hard to discern since 09/2017.  Cardiac cath 10/08/17:  Prox RCA lesion is 100% stenosed.  Ost LAD to Prox LAD lesion is 60% stenosed.  There is severe aortic valve stenosis.  Hemodynamic findings consistent with mild pulmonary hypertension.  1. Chronic total occlusion of the RCA. The mid and distal RCA fills from left to right collaterals.  2. Moderate stenosis proximal LAD. This does not appear to be flow limiting.  3. Severe aortic valve stenosis (mean gradient 41.8 mmHg, peak to peak gradient 44 mmHg, AVA 0.79 cm2) Recommendations: Will continue workup for TAVR.   Echo 06/16/17: Study Conclusions - Procedure narrative: Transthoracic echocardiography. Image   quality was suboptimal. The study was technically difficult, as a   result of poor acoustic windows, poor  sound wave transmission,   and body habitus. Intravenous contrast (Definity) was   administered. - Left ventricle: The cavity size was normal. Wall thickness was   normal. Systolic function was normal. The estimated ejection   fraction was in the range of 50% to 55%. The study is not   technically sufficient to allow evaluation of LV diastolic   function. - Aortic valve: Heavily calcified aortic valve with severe   stenosis. Mean gradient (S): 26 mm Hg. Peak gradient (S): 45 mm   Hg. Valve area (VTI): 0.81 cm^2. Valve area (Vmax): 0.92 cm^2.   Valve area (Vmean): 0.81 cm^2. - Mitral valve: Calcified annulus. Mildly thickened leaflets . - Left atrium: Moderately dilated. Impressions: - Technically difficult study. LVEF lower at 50-55%. A-fib with RVR   is present. There is likely severe aortic stenosis, however, the   mean aortic valve gradient is lower in this study compared to the   prior study, but calculated AVA is still around 0.9 cm2 based on   an LVOT diameter of 2.3 cm.  Preoperative coronary CT, CTA chest/abdomen/pelvis, and chest x-ray noted.  Carotid U/S 10/31/17: Final Interpretation: Right Carotid: There is evidence in the right ICA of a 1-39% stenosis. Left Carotid: There is evidence in the left ICA of a 60-79% stenosis. Large       amount of calcified plaque could obscure a higher grade stenosis. Vertebrals: Both vertebral arteries were patent with antegrade flow.  PFTs 10/31/17: FVC 2.78 (62%), FEV1 2.48 (76%), DLCO unc 22.45 (66%).  Preoperative labs noted. H/H 13.1/38.6. A1c 8.1. Unfortunately, CMET specimen was hemolyzed so  will need to be repeated on the day of surgery.  Last Cr 1.52 on 10/14/17. UA large leukocytes, negative nitrites,  WBC 6-30. PAT RN notified Nell Range, [name] regarding UA and EKG results and that CMET had hemolyzed.   George Hugh [facility] Short Stay Center/Anesthesiology Phone [phone] 12/01/2017 12:42 PM

## 2017-12-02 ENCOUNTER — Inpatient Hospital Stay (HOSPITAL_COMMUNITY): Payer: Medicare Other | Admitting: Certified Registered"

## 2017-12-02 ENCOUNTER — Inpatient Hospital Stay (HOSPITAL_COMMUNITY): Payer: Medicare Other

## 2017-12-02 ENCOUNTER — Inpatient Hospital Stay (HOSPITAL_COMMUNITY): Payer: Medicare Other | Admitting: Vascular Surgery

## 2017-12-02 ENCOUNTER — Inpatient Hospital Stay (HOSPITAL_COMMUNITY)
Admission: RE | Admit: 2017-12-02 | Discharge: 2017-12-05 | DRG: 267 | Disposition: A | Discharge status: Home / Self Care | Payer: Medicare Other | Source: Ambulatory Visit | Attending: Cardiovascular Disease | Admitting: Cardiovascular Disease

## 2017-12-02 ENCOUNTER — Encounter (HOSPITAL_COMMUNITY)
Admission: RE | Disposition: A | Discharge status: Home / Self Care | Payer: Self-pay | Source: Ambulatory Visit | Attending: Cardiovascular Disease

## 2017-12-02 ENCOUNTER — Other Ambulatory Visit: Payer: Self-pay

## 2017-12-02 ENCOUNTER — Encounter (HOSPITAL_COMMUNITY): Payer: Self-pay

## 2017-12-02 DIAGNOSIS — Z006 Encounter for examination for normal comparison and control in clinical research program: Secondary | ICD-10-CM

## 2017-12-02 DIAGNOSIS — E1140 Type 2 diabetes mellitus with diabetic neuropathy, unspecified: Secondary | ICD-10-CM | POA: Diagnosis present

## 2017-12-02 DIAGNOSIS — I2510 Atherosclerotic heart disease of native coronary artery without angina pectoris: Secondary | ICD-10-CM | POA: Diagnosis present

## 2017-12-02 DIAGNOSIS — E669 Obesity, unspecified: Secondary | ICD-10-CM | POA: Diagnosis present

## 2017-12-02 DIAGNOSIS — R918 Other nonspecific abnormal finding of lung field: Secondary | ICD-10-CM | POA: Diagnosis not present

## 2017-12-02 DIAGNOSIS — N189 Chronic kidney disease, unspecified: Secondary | ICD-10-CM | POA: Diagnosis present

## 2017-12-02 DIAGNOSIS — E1151 Type 2 diabetes mellitus with diabetic peripheral angiopathy without gangrene: Secondary | ICD-10-CM | POA: Diagnosis present

## 2017-12-02 DIAGNOSIS — Y92239 Unspecified place in hospital as the place of occurrence of the external cause: Secondary | ICD-10-CM | POA: Diagnosis present

## 2017-12-02 DIAGNOSIS — E1122 Type 2 diabetes mellitus with diabetic chronic kidney disease: Secondary | ICD-10-CM | POA: Diagnosis present

## 2017-12-02 DIAGNOSIS — Y838 Other surgical procedures as the cause of abnormal reaction of the patient, or of later complication, without mention of misadventure at the time of the procedure: Secondary | ICD-10-CM | POA: Diagnosis present

## 2017-12-02 DIAGNOSIS — Z79899 Other long term (current) drug therapy: Secondary | ICD-10-CM

## 2017-12-02 DIAGNOSIS — I97120 Postprocedural cardiac arrest following cardiac surgery: Secondary | ICD-10-CM | POA: Diagnosis not present

## 2017-12-02 DIAGNOSIS — E039 Hypothyroidism, unspecified: Secondary | ICD-10-CM | POA: Diagnosis present

## 2017-12-02 DIAGNOSIS — N183 Chronic kidney disease, stage 3 (moderate): Secondary | ICD-10-CM | POA: Diagnosis present

## 2017-12-02 DIAGNOSIS — Z950 Presence of cardiac pacemaker: Secondary | ICD-10-CM | POA: Diagnosis present

## 2017-12-02 DIAGNOSIS — Z7901 Long term (current) use of anticoagulants: Secondary | ICD-10-CM

## 2017-12-02 DIAGNOSIS — I469 Cardiac arrest, cause unspecified: Secondary | ICD-10-CM

## 2017-12-02 DIAGNOSIS — E1159 Type 2 diabetes mellitus with other circulatory complications: Secondary | ICD-10-CM | POA: Diagnosis present

## 2017-12-02 DIAGNOSIS — I5032 Chronic diastolic (congestive) heart failure: Secondary | ICD-10-CM | POA: Diagnosis present

## 2017-12-02 DIAGNOSIS — Z452 Encounter for adjustment and management of vascular access device: Secondary | ICD-10-CM

## 2017-12-02 DIAGNOSIS — I4819 Other persistent atrial fibrillation: Secondary | ICD-10-CM | POA: Diagnosis present

## 2017-12-02 DIAGNOSIS — I509 Heart failure, unspecified: Secondary | ICD-10-CM | POA: Diagnosis not present

## 2017-12-02 DIAGNOSIS — Z8249 Family history of ischemic heart disease and other diseases of the circulatory system: Secondary | ICD-10-CM

## 2017-12-02 DIAGNOSIS — G4730 Sleep apnea, unspecified: Secondary | ICD-10-CM | POA: Diagnosis present

## 2017-12-02 DIAGNOSIS — Z9989 Dependence on other enabling machines and devices: Secondary | ICD-10-CM

## 2017-12-02 DIAGNOSIS — Z6835 Body mass index (BMI) 35.0-35.9, adult: Secondary | ICD-10-CM

## 2017-12-02 DIAGNOSIS — I340 Nonrheumatic mitral (valve) insufficiency: Secondary | ICD-10-CM | POA: Diagnosis not present

## 2017-12-02 DIAGNOSIS — I083 Combined rheumatic disorders of mitral, aortic and tricuspid valves: Secondary | ICD-10-CM | POA: Diagnosis not present

## 2017-12-02 DIAGNOSIS — I442 Atrioventricular block, complete: Secondary | ICD-10-CM | POA: Diagnosis not present

## 2017-12-02 DIAGNOSIS — Z794 Long term (current) use of insulin: Secondary | ICD-10-CM

## 2017-12-02 DIAGNOSIS — I481 Persistent atrial fibrillation: Secondary | ICD-10-CM | POA: Diagnosis present

## 2017-12-02 DIAGNOSIS — Z952 Presence of prosthetic heart valve: Secondary | ICD-10-CM | POA: Insufficient documentation

## 2017-12-02 DIAGNOSIS — E785 Hyperlipidemia, unspecified: Secondary | ICD-10-CM | POA: Diagnosis present

## 2017-12-02 DIAGNOSIS — I350 Nonrheumatic aortic (valve) stenosis: Secondary | ICD-10-CM | POA: Diagnosis not present

## 2017-12-02 DIAGNOSIS — I152 Hypertension secondary to endocrine disorders: Secondary | ICD-10-CM | POA: Diagnosis present

## 2017-12-02 DIAGNOSIS — E1169 Type 2 diabetes mellitus with other specified complication: Secondary | ICD-10-CM | POA: Diagnosis present

## 2017-12-02 DIAGNOSIS — G4733 Obstructive sleep apnea (adult) (pediatric): Secondary | ICD-10-CM | POA: Diagnosis present

## 2017-12-02 DIAGNOSIS — I272 Pulmonary hypertension, unspecified: Secondary | ICD-10-CM | POA: Diagnosis present

## 2017-12-02 DIAGNOSIS — I252 Old myocardial infarction: Secondary | ICD-10-CM

## 2017-12-02 DIAGNOSIS — J811 Chronic pulmonary edema: Secondary | ICD-10-CM | POA: Diagnosis not present

## 2017-12-02 DIAGNOSIS — I130 Hypertensive heart and chronic kidney disease with heart failure and stage 1 through stage 4 chronic kidney disease, or unspecified chronic kidney disease: Secondary | ICD-10-CM | POA: Diagnosis present

## 2017-12-02 DIAGNOSIS — G8929 Other chronic pain: Secondary | ICD-10-CM | POA: Diagnosis present

## 2017-12-02 DIAGNOSIS — Z7982 Long term (current) use of aspirin: Secondary | ICD-10-CM

## 2017-12-02 DIAGNOSIS — R112 Nausea with vomiting, unspecified: Secondary | ICD-10-CM | POA: Diagnosis present

## 2017-12-02 DIAGNOSIS — I480 Paroxysmal atrial fibrillation: Secondary | ICD-10-CM | POA: Diagnosis present

## 2017-12-02 DIAGNOSIS — Z85828 Personal history of other malignant neoplasm of skin: Secondary | ICD-10-CM

## 2017-12-02 DIAGNOSIS — I739 Peripheral vascular disease, unspecified: Secondary | ICD-10-CM | POA: Diagnosis present

## 2017-12-02 DIAGNOSIS — I2582 Chronic total occlusion of coronary artery: Secondary | ICD-10-CM | POA: Diagnosis present

## 2017-12-02 DIAGNOSIS — R1110 Vomiting, unspecified: Secondary | ICD-10-CM

## 2017-12-02 DIAGNOSIS — Z95818 Presence of other cardiac implants and grafts: Secondary | ICD-10-CM

## 2017-12-02 DIAGNOSIS — I4891 Unspecified atrial fibrillation: Secondary | ICD-10-CM | POA: Diagnosis present

## 2017-12-02 DIAGNOSIS — Z833 Family history of diabetes mellitus: Secondary | ICD-10-CM

## 2017-12-02 DIAGNOSIS — R001 Bradycardia, unspecified: Secondary | ICD-10-CM | POA: Diagnosis not present

## 2017-12-02 DIAGNOSIS — I110 Hypertensive heart disease with heart failure: Secondary | ICD-10-CM | POA: Diagnosis not present

## 2017-12-02 DIAGNOSIS — Z87891 Personal history of nicotine dependence: Secondary | ICD-10-CM

## 2017-12-02 DIAGNOSIS — E119 Type 2 diabetes mellitus without complications: Secondary | ICD-10-CM

## 2017-12-02 DIAGNOSIS — I10 Essential (primary) hypertension: Secondary | ICD-10-CM | POA: Diagnosis present

## 2017-12-02 HISTORY — PX: TEE WITHOUT CARDIOVERSION: SHX5443

## 2017-12-02 HISTORY — DX: Presence of prosthetic heart valve: Z95.2

## 2017-12-02 HISTORY — DX: Presence of cardiac pacemaker: Z95.0

## 2017-12-02 HISTORY — DX: Nonrheumatic aortic (valve) stenosis: I35.0

## 2017-12-02 HISTORY — DX: Chronic diastolic (congestive) heart failure: I50.32

## 2017-12-02 HISTORY — PX: TRANSCATHETER AORTIC VALVE REPLACEMENT, TRANSFEMORAL: SHX6400

## 2017-12-02 LAB — POCT I-STAT, CHEM 8
BUN: 24 mg/dL — ABNORMAL HIGH (ref 6–20)
BUN: 24 mg/dL — ABNORMAL HIGH (ref 6–20)
Calcium, Ion: 1.25 mmol/L (ref 1.15–1.40)
Calcium, Ion: 1.22 mmol/L (ref 1.15–1.40)
Chloride: 102 mmol/L (ref 101–111)
Chloride: 102 mmol/L (ref 101–111)
Creatinine, Ser: 1.6 mg/dL — ABNORMAL HIGH (ref 0.61–1.24)
Creatinine, Ser: 1.7 mg/dL — ABNORMAL HIGH (ref 0.61–1.24)
Glucose, Bld: 197 mg/dL — ABNORMAL HIGH (ref 65–99)
Glucose, Bld: 236 mg/dL — ABNORMAL HIGH (ref 65–99)
HCT: 33 % — ABNORMAL LOW (ref 39.0–52.0)
HCT: 28 % — ABNORMAL LOW (ref 39.0–52.0)
Hemoglobin: 11.2 g/dL — ABNORMAL LOW (ref 13.0–17.0)
Hemoglobin: 9.5 g/dL — ABNORMAL LOW (ref 13.0–17.0)
Potassium: 4 mmol/L (ref 3.5–5.1)
Potassium: 4.3 mmol/L (ref 3.5–5.1)
Sodium: 139 mmol/L (ref 135–145)
Sodium: 138 mmol/L (ref 135–145)
TCO2: 26 mmol/L (ref 22–32)
TCO2: 28 mmol/L (ref 22–32)

## 2017-12-02 LAB — CBC
HCT: 31.8 % — ABNORMAL LOW (ref 39.0–52.0)
Hemoglobin: 11 g/dL — ABNORMAL LOW (ref 13.0–17.0)
MCH: 33.2 pg (ref 26.0–34.0)
MCHC: 34.6 g/dL (ref 30.0–36.0)
MCV: 96.1 fL (ref 78.0–100.0)
Platelets: 143 10*3/uL — ABNORMAL LOW (ref 150–400)
RBC: 3.31 MIL/uL — ABNORMAL LOW (ref 4.22–5.81)
RDW: 13.1 % (ref 11.5–15.5)
WBC: 9.1 10*3/uL (ref 4.0–10.5)

## 2017-12-02 LAB — POCT I-STAT 3, ART BLOOD GAS (G3+)
Acid-base deficit: 2 mmol/L (ref 0.0–2.0)
Bicarbonate: 23.3 mmol/L (ref 20.0–28.0)
O2 Saturation: 99 %
Patient temperature: 97.7
TCO2: 24 mmol/L (ref 22–32)
pCO2 arterial: 39.6 mmHg (ref 32.0–48.0)
pH, Arterial: 7.374 (ref 7.350–7.450)
pO2, Arterial: 136 mmHg — ABNORMAL HIGH (ref 83.0–108.0)

## 2017-12-02 LAB — APTT: aPTT: 36 seconds (ref 24–36)

## 2017-12-02 LAB — COMPREHENSIVE METABOLIC PANEL
ALT: 23 U/L (ref 17–63)
AST: 28 U/L (ref 15–41)
Albumin: 3.8 g/dL (ref 3.5–5.0)
Alkaline Phosphatase: 34 U/L — ABNORMAL LOW (ref 38–126)
Anion gap: 13 (ref 5–15)
BUN: 25 mg/dL — ABNORMAL HIGH (ref 6–20)
CO2: 21 mmol/L — ABNORMAL LOW (ref 22–32)
Calcium: 9.5 mg/dL (ref 8.9–10.3)
Chloride: 103 mmol/L (ref 101–111)
Creatinine, Ser: 1.98 mg/dL — ABNORMAL HIGH (ref 0.61–1.24)
GFR calc Af Amer: 37 mL/min — ABNORMAL LOW (ref >60)
GFR calc non Af Amer: 32 mL/min — ABNORMAL LOW (ref >60)
Glucose, Bld: 183 mg/dL — ABNORMAL HIGH (ref 65–99)
Potassium: 3.9 mmol/L (ref 3.5–5.1)
Sodium: 137 mmol/L (ref 135–145)
Total Bilirubin: 0.6 mg/dL (ref 0.3–1.2)
Total Protein: 6.6 g/dL (ref 6.5–8.1)

## 2017-12-02 LAB — GLUCOSE, CAPILLARY
Glucose-Capillary: 178 mg/dL — ABNORMAL HIGH (ref 65–99)
Glucose-Capillary: 308 mg/dL — ABNORMAL HIGH (ref 65–99)

## 2017-12-02 LAB — POCT I-STAT 4, (NA,K, GLUC, HGB,HCT)
Glucose, Bld: 239 mg/dL — ABNORMAL HIGH (ref 65–99)
HCT: 29 % — ABNORMAL LOW (ref 39.0–52.0)
Hemoglobin: 9.9 g/dL — ABNORMAL LOW (ref 13.0–17.0)
Potassium: 4.4 mmol/L (ref 3.5–5.1)
Sodium: 138 mmol/L (ref 135–145)

## 2017-12-02 LAB — PROTIME-INR
INR: 1.19
Prothrombin Time: 15 seconds (ref 11.4–15.2)

## 2017-12-02 SURGERY — IMPLANTATION, AORTIC VALVE, TRANSCATHETER, FEMORAL APPROACH
Anesthesia: General | Site: Chest

## 2017-12-02 MED ORDER — 0.9 % SODIUM CHLORIDE (POUR BTL) OPTIME
TOPICAL | Status: DC | PRN
  Administered 2017-12-02: 6000 mL

## 2017-12-02 MED ORDER — INSULIN ASPART 100 UNIT/ML ~~LOC~~ SOLN
6.0000 [IU] | Freq: Three times a day (TID) | SUBCUTANEOUS | Status: DC
  Administered 2017-12-02 – 2017-12-05 (×6): 6 [IU] via SUBCUTANEOUS

## 2017-12-02 MED ORDER — MIDAZOLAM HCL 2 MG/2ML IJ SOLN
INTRAMUSCULAR | Status: DC | PRN
  Administered 2017-12-02: 1 mg via INTRAVENOUS

## 2017-12-02 MED ORDER — FESOTERODINE FUMARATE ER 4 MG PO TB24
4.0000 mg | ORAL_TABLET | Freq: Every day | ORAL | Status: DC
  Filled 2017-12-02: qty 1

## 2017-12-02 MED ORDER — FESOTERODINE FUMARATE ER 4 MG PO TB24
4.0000 mg | ORAL_TABLET | Freq: Every day | ORAL | Status: DC
  Administered 2017-12-03 – 2017-12-05 (×2): 4 mg via ORAL
  Filled 2017-12-02 (×3): qty 1

## 2017-12-02 MED ORDER — CHLORHEXIDINE GLUCONATE 4 % EX LIQD: 60.0000 mL | Freq: Once | CUTANEOUS | Status: DC

## 2017-12-02 MED ORDER — DEXMEDETOMIDINE HCL IN NACL 400 MCG/100ML IV SOLN
INTRAVENOUS | Status: DC | PRN
  Administered 2017-12-02: 1 ug/kg/h via INTRAVENOUS

## 2017-12-02 MED ORDER — ONDANSETRON HCL 4 MG/2ML IJ SOLN
INTRAMUSCULAR | Status: AC
  Filled 2017-12-02: qty 2

## 2017-12-02 MED ORDER — FENTANYL CITRATE (PF) 250 MCG/5ML IJ SOLN
INTRAMUSCULAR | Status: AC
  Filled 2017-12-02: qty 5

## 2017-12-02 MED ORDER — ASPIRIN EC 81 MG PO TBEC
81.0000 mg | DELAYED_RELEASE_TABLET | Freq: Every day | ORAL | Status: DC
  Filled 2017-12-02: qty 1

## 2017-12-02 MED ORDER — IODIXANOL 320 MG/ML IV SOLN
INTRAVENOUS | Status: DC | PRN
  Administered 2017-12-02: 31.78 mL via INTRAVENOUS

## 2017-12-02 MED ORDER — AMIODARONE HCL 200 MG PO TABS
200.0000 mg | ORAL_TABLET | Freq: Every day | ORAL | Status: DC
  Administered 2017-12-03 – 2017-12-05 (×2): 200 mg via ORAL
  Filled 2017-12-02 (×2): qty 1

## 2017-12-02 MED ORDER — FENOFIBRATE 54 MG PO TABS
54.0000 mg | ORAL_TABLET | Freq: Every day | ORAL | Status: DC
  Administered 2017-12-02 – 2017-12-05 (×3): 54 mg via ORAL
  Filled 2017-12-02 (×4): qty 1

## 2017-12-02 MED ORDER — ASPIRIN 81 MG PO CHEW
81.0000 mg | CHEWABLE_TABLET | Freq: Every day | ORAL | Status: DC
  Administered 2017-12-03: 81 mg
  Filled 2017-12-02: qty 1

## 2017-12-02 MED ORDER — PROPOFOL 10 MG/ML IV BOLUS
INTRAVENOUS | Status: DC | PRN
  Administered 2017-12-02: 20 mg via INTRAVENOUS
  Administered 2017-12-02: 80 mg via INTRAVENOUS
  Administered 2017-12-02: 30 mg via INTRAVENOUS

## 2017-12-02 MED ORDER — FENTANYL CITRATE (PF) 250 MCG/5ML IJ SOLN
INTRAMUSCULAR | Status: DC | PRN
  Administered 2017-12-02: 100 ug via INTRAVENOUS
  Administered 2017-12-02: 50 ug via INTRAVENOUS

## 2017-12-02 MED ORDER — TRAMADOL HCL 50 MG PO TABS: 50.0000 mg | ORAL_TABLET | ORAL | Status: DC | PRN

## 2017-12-02 MED ORDER — PROTAMINE SULFATE 10 MG/ML IV SOLN
INTRAVENOUS | Status: DC | PRN
  Administered 2017-12-02: 150 mg via INTRAVENOUS

## 2017-12-02 MED ORDER — FAMOTIDINE IN NACL 20-0.9 MG/50ML-% IV SOLN: 20.0000 mg | Freq: Every day | INTRAVENOUS | Status: DC

## 2017-12-02 MED ORDER — SUGAMMADEX SODIUM 500 MG/5ML IV SOLN
INTRAVENOUS | Status: DC | PRN
  Administered 2017-12-02: 230 mg via INTRAVENOUS

## 2017-12-02 MED ORDER — INSULIN ASPART 100 UNIT/ML ~~LOC~~ SOLN
0.0000 [IU] | SUBCUTANEOUS | Status: DC
  Administered 2017-12-02: 7 [IU] via SUBCUTANEOUS

## 2017-12-02 MED ORDER — HEPARIN SODIUM (PORCINE) 1000 UNIT/ML IJ SOLN
INTRAMUSCULAR | Status: DC | PRN
  Administered 2017-12-02: 15000 [IU] via INTRAVENOUS

## 2017-12-02 MED ORDER — LEVOTHYROXINE SODIUM 50 MCG PO TABS
50.0000 ug | ORAL_TABLET | Freq: Every day | ORAL | Status: DC
  Administered 2017-12-03 – 2017-12-05 (×3): 50 ug via ORAL
  Filled 2017-12-02 (×3): qty 1

## 2017-12-02 MED ORDER — VANCOMYCIN HCL IN DEXTROSE 1-5 GM/200ML-% IV SOLN
1000.0000 mg | Freq: Once | INTRAVENOUS | Status: AC
  Administered 2017-12-02: 1000 mg via INTRAVENOUS
  Filled 2017-12-02: qty 200

## 2017-12-02 MED ORDER — MIDAZOLAM HCL 2 MG/2ML IJ SOLN
INTRAMUSCULAR | Status: AC
  Filled 2017-12-02: qty 2

## 2017-12-02 MED ORDER — MIDAZOLAM HCL 2 MG/2ML IJ SOLN: 2.0000 mg | INTRAMUSCULAR | Status: DC | PRN

## 2017-12-02 MED ORDER — FAMOTIDINE IN NACL 20-0.9 MG/50ML-% IV SOLN: 20.0000 mg | Freq: Every day | INTRAVENOUS | Status: AC

## 2017-12-02 MED ORDER — ALBUMIN HUMAN 5 % IV SOLN: 250.0000 mL | INTRAVENOUS | Status: DC | PRN

## 2017-12-02 MED ORDER — ROCURONIUM BROMIDE 10 MG/ML (PF) SYRINGE
PREFILLED_SYRINGE | INTRAVENOUS | Status: AC
  Filled 2017-12-02: qty 5

## 2017-12-02 MED ORDER — HEPARIN SODIUM (PORCINE) 1000 UNIT/ML IJ SOLN
INTRAMUSCULAR | Status: AC
  Filled 2017-12-02: qty 1

## 2017-12-02 MED ORDER — DOCUSATE SODIUM 100 MG PO CAPS
100.0000 mg | ORAL_CAPSULE | Freq: Every day | ORAL | Status: DC
  Administered 2017-12-02 – 2017-12-05 (×3): 100 mg via ORAL
  Filled 2017-12-02 (×3): qty 1

## 2017-12-02 MED ORDER — ACETAMINOPHEN 500 MG PO TABS
1000.0000 mg | ORAL_TABLET | Freq: Four times a day (QID) | ORAL | Status: DC
  Administered 2017-12-03 – 2017-12-05 (×7): 1000 mg via ORAL
  Filled 2017-12-02 (×8): qty 2

## 2017-12-02 MED ORDER — ACETAMINOPHEN 160 MG/5ML PO SOLN: 1000.0000 mg | Freq: Four times a day (QID) | ORAL | Status: DC

## 2017-12-02 MED ORDER — LACTATED RINGERS IV SOLN: 500.0000 mL | Freq: Once | INTRAVENOUS | Status: DC | PRN

## 2017-12-02 MED ORDER — ONDANSETRON HCL 4 MG/2ML IJ SOLN
4.0000 mg | Freq: Four times a day (QID) | INTRAMUSCULAR | Status: DC | PRN
  Administered 2017-12-02: 4 mg via INTRAVENOUS
  Filled 2017-12-02: qty 2

## 2017-12-02 MED ORDER — METOPROLOL TARTRATE 5 MG/5ML IV SOLN: 2.5000 mg | INTRAVENOUS | Status: DC | PRN

## 2017-12-02 MED ORDER — PHENYLEPHRINE HCL 10 MG/ML IJ SOLN
INTRAVENOUS | Status: DC | PRN
  Administered 2017-12-02: 50 ug/min via INTRAVENOUS

## 2017-12-02 MED ORDER — SODIUM CHLORIDE 0.9 % IV SOLN
INTRAVENOUS | Status: DC | PRN
  Administered 2017-12-02: 1500 mL

## 2017-12-02 MED ORDER — DEXAMETHASONE SODIUM PHOSPHATE 10 MG/ML IJ SOLN
INTRAMUSCULAR | Status: DC | PRN
  Administered 2017-12-02: 5 mg via INTRAVENOUS

## 2017-12-02 MED ORDER — DEXAMETHASONE SODIUM PHOSPHATE 10 MG/ML IJ SOLN
INTRAMUSCULAR | Status: AC
  Filled 2017-12-02: qty 1

## 2017-12-02 MED ORDER — ONDANSETRON HCL 4 MG/2ML IJ SOLN
INTRAMUSCULAR | Status: DC | PRN
  Administered 2017-12-02: 4 mg via INTRAVENOUS

## 2017-12-02 MED ORDER — SODIUM CHLORIDE 0.9 % IV SOLN
0.0000 ug/min | INTRAVENOUS | Status: DC
  Filled 2017-12-02: qty 2

## 2017-12-02 MED ORDER — AMIODARONE HCL 200 MG PO TABS: 200.0000 mg | ORAL_TABLET | Freq: Every day | ORAL | Status: DC

## 2017-12-02 MED ORDER — LACTATED RINGERS IV SOLN
INTRAVENOUS | Status: DC | PRN
  Administered 2017-12-02 (×2): via INTRAVENOUS

## 2017-12-02 MED ORDER — SODIUM CHLORIDE 0.9 % IV SOLN: INTRAVENOUS | Status: DC

## 2017-12-02 MED ORDER — ATORVASTATIN CALCIUM 80 MG PO TABS
80.0000 mg | ORAL_TABLET | Freq: Every day | ORAL | Status: DC
  Administered 2017-12-02 – 2017-12-04 (×3): 80 mg via ORAL
  Filled 2017-12-02 (×3): qty 1

## 2017-12-02 MED ORDER — MIRABEGRON ER 25 MG PO TB24
25.0000 mg | ORAL_TABLET | Freq: Every day | ORAL | Status: DC
  Filled 2017-12-02: qty 1

## 2017-12-02 MED ORDER — PROPOFOL 10 MG/ML IV BOLUS
INTRAVENOUS | Status: AC
  Filled 2017-12-02: qty 20

## 2017-12-02 MED ORDER — DEXTROSE 5 % IV SOLN
1.5000 g | Freq: Two times a day (BID) | INTRAVENOUS | Status: AC
  Administered 2017-12-02 – 2017-12-03 (×3): 1.5 g via INTRAVENOUS
  Filled 2017-12-02 (×5): qty 1.5

## 2017-12-02 MED ORDER — PROTAMINE SULFATE 10 MG/ML IV SOLN
INTRAVENOUS | Status: AC
  Filled 2017-12-02: qty 25

## 2017-12-02 MED ORDER — NITROGLYCERIN IN D5W 200-5 MCG/ML-% IV SOLN: 0.0000 ug/min | INTRAVENOUS | Status: DC

## 2017-12-02 MED ORDER — INSULIN ASPART 100 UNIT/ML ~~LOC~~ SOLN
0.0000 [IU] | Freq: Three times a day (TID) | SUBCUTANEOUS | Status: DC
  Administered 2017-12-02: 15 [IU] via SUBCUTANEOUS
  Administered 2017-12-03: 4 [IU] via SUBCUTANEOUS
  Administered 2017-12-03: 7 [IU] via SUBCUTANEOUS
  Administered 2017-12-03 – 2017-12-04 (×2): 4 [IU] via SUBCUTANEOUS
  Administered 2017-12-04: 7 [IU] via SUBCUTANEOUS
  Administered 2017-12-05: 4 [IU] via SUBCUTANEOUS

## 2017-12-02 MED ORDER — CHLORHEXIDINE GLUCONATE 0.12 % MT SOLN
15.0000 mL | Freq: Once | OROMUCOSAL | Status: AC
  Administered 2017-12-02: 15 mL via OROMUCOSAL
  Filled 2017-12-02: qty 15

## 2017-12-02 MED ORDER — MORPHINE SULFATE (PF) 4 MG/ML IV SOLN: 2.0000 mg | INTRAVENOUS | Status: DC | PRN

## 2017-12-02 MED ORDER — OXYCODONE HCL 5 MG PO TABS
5.0000 mg | ORAL_TABLET | ORAL | Status: DC | PRN
  Administered 2017-12-03: 5 mg via ORAL
  Filled 2017-12-02: qty 1

## 2017-12-02 MED ORDER — ROCURONIUM BROMIDE 100 MG/10ML IV SOLN
INTRAVENOUS | Status: DC | PRN
  Administered 2017-12-02: 50 mg via INTRAVENOUS

## 2017-12-02 MED ORDER — PANTOPRAZOLE SODIUM 40 MG PO TBEC
40.0000 mg | DELAYED_RELEASE_TABLET | Freq: Every day | ORAL | Status: DC
  Administered 2017-12-05: 40 mg via ORAL
  Filled 2017-12-02: qty 1

## 2017-12-02 MED ORDER — MORPHINE SULFATE (PF) 2 MG/ML IV SOLN: 2.0000 mg | INTRAVENOUS | Status: DC | PRN

## 2017-12-02 MED ORDER — MIRABEGRON ER 25 MG PO TB24
25.0000 mg | ORAL_TABLET | Freq: Every day | ORAL | Status: DC
  Administered 2017-12-03 – 2017-12-05 (×2): 25 mg via ORAL
  Filled 2017-12-02 (×3): qty 1

## 2017-12-02 MED ORDER — MAGNESIUM OXIDE 400 (241.3 MG) MG PO TABS
400.0000 mg | ORAL_TABLET | Freq: Every day | ORAL | Status: DC
  Administered 2017-12-02 – 2017-12-05 (×3): 400 mg via ORAL
  Filled 2017-12-02 (×3): qty 1

## 2017-12-02 MED ORDER — CHLORHEXIDINE GLUCONATE 4 % EX LIQD: 30.0000 mL | CUTANEOUS | Status: DC

## 2017-12-02 MED ORDER — SODIUM CHLORIDE 0.9 % IV SOLN
INTRAVENOUS | Status: DC
  Administered 2017-12-02: 11:00:00 via INTRAVENOUS

## 2017-12-02 SURGICAL SUPPLY — 97 items
ADAPTER UNIV SWAN GANZ BIP (ADAPTER) ×1 IMPLANT
ADAPTER UNV SWAN GANZ BIP (ADAPTER) ×1
BAG BANDED W/RUBBER/TAPE 36X54 (MISCELLANEOUS) ×2 IMPLANT
BAG DECANTER FOR FLEXI CONT (MISCELLANEOUS) IMPLANT
BAG SNAP BAND KOVER 36X36 (MISCELLANEOUS) ×4 IMPLANT
BLADE CLIPPER SURG (BLADE) IMPLANT
BLADE OSCILLATING /SAGITTAL (BLADE) IMPLANT
BLADE STERNUM SYSTEM 6 (BLADE) ×2 IMPLANT
CABLE ADAPT CONN TEMP 6FT (ADAPTER) ×2 IMPLANT
CANNULA FEM VENOUS REMOTE 22FR (CANNULA) IMPLANT
CANNULA OPTISITE PERFUSION 16F (CANNULA) IMPLANT
CANNULA OPTISITE PERFUSION 18F (CANNULA) IMPLANT
CATH DIAG EXPO 6F VENT PIG 145 (CATHETERS) ×4 IMPLANT
CATH EXPO 5FR AL1 (CATHETERS) ×2 IMPLANT
CATH S G BIP PACING (SET/KITS/TRAYS/PACK) ×4 IMPLANT
CLIP VESOCCLUDE MED 24/CT (CLIP) IMPLANT
CLIP VESOCCLUDE SM WIDE 24/CT (CLIP) IMPLANT
CONT SPEC 4OZ CLIKSEAL STRL BL (MISCELLANEOUS) ×12 IMPLANT
COVER BACK TABLE 24X17X13 BIG (DRAPES) ×2 IMPLANT
COVER BACK TABLE 60X90IN (DRAPES) ×4 IMPLANT
COVER BACK TABLE 80X110 HD (DRAPES) ×2 IMPLANT
COVER DOME SNAP 22 D (MISCELLANEOUS) ×2 IMPLANT
COVER MAYO STAND STRL (DRAPES) ×2 IMPLANT
COVER PROBE W GEL 5X96 (DRAPES) ×2 IMPLANT
CRADLE DONUT ADULT HEAD (MISCELLANEOUS) ×2 IMPLANT
DERMABOND ADVANCED (GAUZE/BANDAGES/DRESSINGS) ×1
DERMABOND ADVANCED .7 DNX12 (GAUZE/BANDAGES/DRESSINGS) ×1 IMPLANT
DEVICE CLOSURE PERCLS PRGLD 6F (VASCULAR PRODUCTS) ×2 IMPLANT
DRAPE INCISE IOBAN 66X45 STRL (DRAPES) IMPLANT
DRAPE SLUSH MACHINE 52X66 (DRAPES) ×2 IMPLANT
DRSG TEGADERM 4X4.75 (GAUZE/BANDAGES/DRESSINGS) ×4 IMPLANT
ELECT REM PT RETURN 9FT ADLT (ELECTROSURGICAL) ×4
ELECTRODE REM PT RTRN 9FT ADLT (ELECTROSURGICAL) ×2 IMPLANT
FELT TEFLON 6X6 (MISCELLANEOUS) IMPLANT
FEMORAL VENOUS CANN RAP (CANNULA) IMPLANT
GAUZE SPONGE 4X4 12PLY STRL (GAUZE/BANDAGES/DRESSINGS) ×4 IMPLANT
GLOVE BIO SURGEON STRL SZ7.5 (GLOVE) ×2 IMPLANT
GLOVE BIO SURGEON STRL SZ8 (GLOVE) ×2 IMPLANT
GLOVE BIOGEL PI IND STRL 6.5 (GLOVE) ×3 IMPLANT
GLOVE BIOGEL PI INDICATOR 6.5 (GLOVE) ×3
GLOVE EUDERMIC 7 POWDERFREE (GLOVE) ×4 IMPLANT
GLOVE ORTHO TXT STRL SZ7.5 (GLOVE) ×4 IMPLANT
GOWN STRL REUS W/ TWL LRG LVL3 (GOWN DISPOSABLE) ×3 IMPLANT
GOWN STRL REUS W/ TWL XL LVL3 (GOWN DISPOSABLE) ×6 IMPLANT
GOWN STRL REUS W/TWL LRG LVL3 (GOWN DISPOSABLE) ×3
GOWN STRL REUS W/TWL XL LVL3 (GOWN DISPOSABLE) ×6
GUIDEWIRE SAFE TJ AMPLATZ EXST (WIRE) ×2 IMPLANT
GUIDEWIRE STRAIGHT .035 260CM (WIRE) ×2 IMPLANT
INSERT FOGARTY 61MM (MISCELLANEOUS) IMPLANT
INSERT FOGARTY SM (MISCELLANEOUS) IMPLANT
INSERT FOGARTY XLG (MISCELLANEOUS) IMPLANT
KIT BASIN OR (CUSTOM PROCEDURE TRAY) ×2 IMPLANT
KIT DILATOR VASC 18G NDL (KITS) IMPLANT
KIT HEART LEFT (KITS) ×2 IMPLANT
KIT ROOM TURNOVER OR (KITS) ×2 IMPLANT
KIT SUCTION CATH 14FR (SUCTIONS) IMPLANT
NEEDLE 22X1 1/2 (OR ONLY) (NEEDLE) IMPLANT
NEEDLE PERC 18GX7CM (NEEDLE) ×2 IMPLANT
NS IRRIG 1000ML POUR BTL (IV SOLUTION) ×6 IMPLANT
PACK AORTA (CUSTOM PROCEDURE TRAY) ×2 IMPLANT
PAD ARMBOARD 7.5X6 YLW CONV (MISCELLANEOUS) ×4 IMPLANT
PAD ELECT DEFIB RADIOL ZOLL (MISCELLANEOUS) ×2 IMPLANT
PERCLOSE PROGLIDE 6F (VASCULAR PRODUCTS) ×4
SET MICROPUNCTURE 5F STIFF (MISCELLANEOUS) ×2 IMPLANT
SHEATH AVANTI 11CM 8FR (MISCELLANEOUS) ×2 IMPLANT
SHEATH PINNACLE 6F 10CM (SHEATH) ×4 IMPLANT
SLEEVE REPOSITIONING LENGTH 30 (MISCELLANEOUS) ×2 IMPLANT
SPONGE LAP 4X18 X RAY DECT (DISPOSABLE) IMPLANT
STOPCOCK MORSE 400PSI 3WAY (MISCELLANEOUS) ×12 IMPLANT
SUT ETHIBOND X763 2 0 SH 1 (SUTURE) ×2 IMPLANT
SUT GORETEX CV 4 TH 22 36 (SUTURE) ×2 IMPLANT
SUT GORETEX CV4 TH-18 (SUTURE) ×6 IMPLANT
SUT GORETEX TH-18 36 INCH (SUTURE) ×4 IMPLANT
SUT MNCRL AB 3-0 PS2 18 (SUTURE) ×2 IMPLANT
SUT PROLENE 3 0 SH1 36 (SUTURE) IMPLANT
SUT PROLENE 4 0 RB 1 (SUTURE) ×1
SUT PROLENE 4-0 RB1 .5 CRCL 36 (SUTURE) ×1 IMPLANT
SUT PROLENE 5 0 C 1 36 (SUTURE) ×4 IMPLANT
SUT PROLENE 6 0 C 1 30 (SUTURE) ×4 IMPLANT
SUT SILK  1 MH (SUTURE) ×1
SUT SILK 1 MH (SUTURE) ×1 IMPLANT
SUT SILK 2 0 SH CR/8 (SUTURE) IMPLANT
SUT VIC AB 2-0 CT1 27 (SUTURE) ×1
SUT VIC AB 2-0 CT1 TAPERPNT 27 (SUTURE) ×1 IMPLANT
SUT VIC AB 2-0 CTX 36 (SUTURE) IMPLANT
SUT VIC AB 3-0 SH 8-18 (SUTURE) ×4 IMPLANT
SYR 10ML LL (SYRINGE) ×6 IMPLANT
SYR 30ML LL (SYRINGE) ×4 IMPLANT
SYR 50ML LL SCALE MARK (SYRINGE) ×2 IMPLANT
SYR CONTROL 10ML LL (SYRINGE) IMPLANT
TOWEL OR 17X26 10 PK STRL BLUE (TOWEL DISPOSABLE) ×4 IMPLANT
TRANSDUCER W/STOPCOCK (MISCELLANEOUS) ×4 IMPLANT
TRAY FOLEY SILVER 16FR TEMP (SET/KITS/TRAYS/PACK) ×2 IMPLANT
TUBING HIGH PRESSURE 120CM (CONNECTOR) ×2 IMPLANT
VALVE HEART TRANSCATH SZ3 29MM (Prosthesis & Implant Heart) ×2 IMPLANT
WIRE AMPLATZ SS-J .035X180CM (WIRE) ×2 IMPLANT
WIRE MINI STICK MAX (SHEATH) IMPLANT

## 2017-12-02 NOTE — Anesthesia Procedure Notes (Signed)
Central Venous Catheter Insertion Performed by: Albertha Ghee, MD, anesthesiologist Start/End1/22/2019 6:50 AM, 12/02/2017 7:00 AM Patient location: Pre-op. Preanesthetic checklist: patient identified, IV checked, site marked, risks and benefits discussed, surgical consent, monitors and equipment checked, pre-op evaluation, timeout performed and anesthesia consent Position: Trendelenburg Lidocaine 1% used for infiltration and patient sedated Hand hygiene performed , maximum sterile barriers used  and Seldinger technique used Catheter size: 7 Fr Central line was placed.Double lumen Procedure performed using ultrasound guided technique. Ultrasound Notes:anatomy identified, needle tip was noted to be adjacent to the nerve/plexus identified and no ultrasound evidence of intravascular and/or intraneural injection Attempts: 1 Following insertion, line sutured, dressing applied and Biopatch. Post procedure assessment: blood return through all ports, free fluid flow and no air  Patient tolerated the procedure well with no immediate complications.

## 2017-12-02 NOTE — OR Nursing (Signed)
Forty-five minute call to SICU charge nurse at [phone]. Spoke to [employer]. Twenty minute call to SICU charge nurse at [phone]. Spoke to [employer].

## 2017-12-02 NOTE — Care Management Note (Addendum)
Case Management Note  Patient Details  Name: RAQUON MILLEDGE MRN: [id number] Date of Birth: 25-Jun-1941  Subjective/Objective:   From home with spouse, s/p TAVR.                  Action/Plan: NCM will follow for dc needs.   Expected Discharge Date:                  Expected Discharge Plan:     In-House Referral:     Discharge planning Services  CM Consult  Post Acute Care Choice:    Choice offered to:     DME Arranged:    DME Agency:     HH Arranged:    HH Agency:     Status of Service:  In process, will continue to follow  If discussed at Long Length of Stay Meetings, dates discussed:    Additional Comments:  Zenon Mayo, RN 12/02/2017, 4:24 PM

## 2017-12-02 NOTE — CV Procedure (Signed)
[facility]  TAVR OPERATIVE NOTE   Date of Procedure:  12/02/2017  Preoperative Diagnosis: Severe Aortic Stenosis   Postoperative Diagnosis: Same   Procedure:    Transcatheter Aortic Valve Replacement - Transfemoral Approach  Edwards Sapien 3 THV (size 29 mm, model # [id number], serial # [id number])   Co-Surgeons:  [name], MD and Valentina Gu. Roxy Manns, MD   Anesthesiologist:  Hodierne  Echocardiographer:  Johnsie Cancel  Pre-operative Echo Findings:  Severe aortic stenosis  Normal left ventricular systolic function  Post-operative Echo Findings:  No paravalvular leak  Normal left ventricular systolic function  BRIEF CLINICAL NOTE AND INDICATIONS FOR SURGERY  77 yo male with DM, HTN, HLD, PAF, CAD and severe aortic stenosis here today for TAVR.   During the course of the patient's preoperative work up they have been evaluated comprehensively by a multidisciplinary team of specialists coordinated through the [facility] in the [facility].  They have been demonstrated to suffer from symptomatic severe aortic stenosis as noted above. The patient has been counseled extensively as to the relative risks and benefits of all options for the treatment of severe aortic stenosis including long term medical therapy, conventional surgery for aortic valve replacement, and transcatheter aortic valve replacement.  The patient has been independently evaluated by two cardiac surgeons including Dr Roxy Manns and Dr. Cyndia Bent, and they are felt to be at high risk for conventional surgical aortic valve replacement. Both surgeons indicated the patient would be a poor candidate for conventional surgery. Based upon review of all of the patient's preoperative diagnostic tests they are felt to be candidate for transcatheter aortic valve replacement using the transfemoral approach as an alternative to high risk conventional surgery.    Following the  decision to proceed with transcatheter aortic valve replacement, a discussion has been held regarding what types of management strategies would be attempted intraoperatively in the event of life-threatening complications, including whether or not the patient would be considered a candidate for the use of cardiopulmonary bypass and/or conversion to open sternotomy for attempted surgical intervention.  The patient has been advised of a variety of complications that might develop peculiar to this approach including but not limited to risks of death, stroke, paravalvular leak, aortic dissection or other major vascular complications, aortic annulus rupture, device embolization, cardiac rupture or perforation, acute myocardial infarction, arrhythmia, heart block or bradycardia requiring permanent pacemaker placement, congestive heart failure, respiratory failure, renal failure, pneumonia, infection, other late complications related to structural valve deterioration or migration, or other complications that might ultimately cause a temporary or permanent loss of functional independence or other long term morbidity.  The patient provides full informed consent for the procedure as described and all questions were answered preoperatively.    DETAILS OF THE OPERATIVE PROCEDURE  PREPARATION:   The patient is brought to the operating room on the above mentioned date and central monitoring was established by the anesthesia team including placement of a radial arterial line. The patient is placed in the supine position on the operating table.  Intravenous antibiotics are administered. General anesthesia is used. A Foley catheter is placed.  Baseline transesophageal echocardiogram was performed. The patient's chest, abdomen, both groins, and both lower extremities are prepared and draped in a sterile manner. A time out procedure is performed.   PERIPHERAL ACCESS:   Using the modified Seldinger technique with u/s  guidance, femoral arterial and venous access were obtained with placement of 6 Fr sheaths on the  left side.  A pigtail diagnostic catheter was passed through the femoral arterial sheath under fluoroscopic guidance into the aortic root.  A temporary transvenous pacemaker catheter was passed through the femoral venous sheath under fluoroscopic guidance into the right ventricle.  The pacemaker was tested to ensure stable lead placement and pacemaker capture. Aortic root angiography was performed in order to determine the optimal angiographic angle for valve deployment.  TRANSFEMORAL ACCESS:  A micropuncture kit was used to gain access to the right femoral artery using u/s for guidance. Pre-closure with double ProGlide closure devices. The patient was heparinized systemically and ACT verified > 250 seconds.    A 16 Fr transfemoral E-sheath was introduced into the right femoral artery after progressively dilating over an Amplatz superstiff wire. An AL-2 catheter was used to direct a straight-tip exchange length wire across the native aortic valve into the left ventricle. This was exchanged out for a pigtail catheter and position was confirmed in the LV apex. Simultaneous LV and Ao pressures were recorded.  The pigtail catheter was then exchanged for an Amplatz Extra-stiff wire in the LV apex.   TRANSCATHETER HEART VALVE DEPLOYMENT:  An Edwards Sapien 3 THV (size 29 mm) was prepared and crimped per manufacturer's guidelines, and the proper orientation of the valve is confirmed on the [employer] delivery system. The valve was advanced through the introducer sheath using normal technique until in an appropriate position in the abdominal aorta beyond the sheath tip. The balloon was then retracted and using the fine-tuning wheel was centered on the valve. The valve was then advanced across the aortic arch using appropriate flexion of the catheter. The valve was carefully positioned across the aortic valve  annulus. The Commander catheter was retracted using normal technique. Once final position of the valve has been confirmed by angiographic assessment, the valve is deployed while temporarily holding ventilation and during rapid ventricular pacing to maintain systolic blood pressure < 50 mmHg and pulse pressure < 10 mmHg. The balloon inflation is held for >3 seconds after reaching full deployment volume. Once the balloon has fully deflated the balloon is retracted into the ascending aorta and valve function is assessed using TEE. There is felt to be no paravalvular leak and no central aortic insufficiency.  The patient's hemodynamic recovery following valve deployment is good.  The deployment balloon and guidewire are both removed. Echo demostrated acceptable post-procedural gradients, stable mitral valve function, and no AI.   PROCEDURE COMPLETION:  The sheath was then removed and closure devices were completed. Protamine was administered once femoral arterial repair was complete. The temporary pacemaker, pigtail catheters and femoral sheaths were removed with manual pressure used for hemostasis.   The patient tolerated the procedure well and is transported to the surgical intensive care in stable condition. There were no immediate intraoperative complications. All sponge instrument and needle counts are verified correct at completion of the operation.   No blood products were administered during the operation.  The patient received a total of 31.7 mL of intravenous contrast during the procedure.  [name] MD 12/02/2017 9:42 AM

## 2017-12-02 NOTE — Progress Notes (Signed)
  [facility] VALVE TEAM  Patient doing well s/p TAVR. He is hemodynamically stable. Groin sites stable. Still has temp PM wire in place in left groin. He is currently pacing at 60 bmp. I turned down pacer and he is still pacing at lowest setting (30 bmp). Will keep him NPO after midnight and check in morning. If his HR remains low (<40 bpm) we will consult EP.    Angelena Form [name]  [facility]  Pager [phone]

## 2017-12-02 NOTE — Op Note (Signed)
[facility]   MULTIDISCIPLINARY HEART VALVE TEAM   TAVR OPERATIVE NOTE   Date of Procedure:  12/02/2017  Preoperative Diagnosis: Severe Aortic Stenosis   Postoperative Diagnosis: Same   Procedure:    Transcatheter Aortic Valve Replacement - Percutaneous Right Transfemoral Approach  Edwards Sapien 3 THV (size 29 mm, model # [id number], serial # [id number])   Co-Surgeons:  [name], MD and Valentina Gu. Roxy Manns, MD   Anesthesiologist:  Albertha Ghee, MD  Echocardiographer:  Jenkins Rouge, MD  Pre-operative Echo Findings:  Severe aortic stenosis  Normal left ventricular systolic function  Post-operative Echo Findings:  No paravalvular leak  Normal left ventricular systolic function   BRIEF CLINICAL NOTE AND INDICATIONS FOR SURGERY  Patient is a 77 year old moderately obese white male with history of aortic stenosis, coronary artery disease status post acute myocardial infarction in 2012, recurrent persistent atrial fibrillation maintaining sinus rhythm on amiodarone since most recent DC cardioversion, hypertension, chronic diastolic congestive heart failure, 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  The patient's cardiac history dates back to 2012 when he suffered an acute myocardial infarction.  At the time he was treated at [facility] where catheterization reportedly revealed occluded right coronary artery with collaterals.  Medical therapy was recommended.  The patient developed paroxysmal atrial fibrillation and has been followed intermittently ever since on long-term anticoagulation using Pradaxa.  He was noted to have a heart murmur on physical exam and echocardiograms revealed aortic  stenosis that has gradually progressed in severity.  For the last several years the patient has been followed by Dr. Angelena Form for management of aortic stenosis, coronary artery disease, and atrial fibrillation.  He has been followed by Dr. Fletcher Anon in the [facility] clinic.  Last spring he was noted to be back in atrial fibrillation and he was referred to the atrial fibrillation clinic.  He was loaded with amiodarone he has been maintaining sinus rhythm since he underwent repeat cardioversion performed September 2018.  He was seen in follow-up recently by Dr. Angelena Form where he reported slow progression of symptoms of exertional shortness of breath and fatigue despite the fact that he was maintaining sinus rhythm.   Most recent follow-up transthoracic echocardiogram performed June 16, 2017 suggested significant progression in the severity of aortic stenosis.  The patient was in atrial fibrillation at the time of the echocardiogram, and peak velocity across the aortic valve range between different length cardiac cycles.  Peak velocity was reported 3.3 m/s corresponding to mean transvalvular gradient estimated 26 mmHg, but the DVI was quite low at 0.19.  The aortic valve area calculated only 0.8 cm.  Left ventricular function remained stable with ejection fraction estimated 50-55%.  The patient subsequently underwent left and right heart catheterization on October 08, 2017 by Dr. Angelena Form.  Catheterization revealed the presence of severe aortic stenosis with peak to peak and mean transvalvular gradients measured 44 and 41.8 mmHg, respectively, corresponding to aortic valve area calculated 0.79 cm.  There was moderate 60% proximal stenosis of the left anterior descending coronary artery that was felt not to be flow limiting.  There was 100% chronic occlusion of the right coronary artery.  There was otherwise nonobstructive coronary artery disease.  Pulmonary artery pressures were mildly elevated.  Patient subsequently  underwent CT angiography and has been  referred for surgical consultation.  During the course of the patient's preoperative work up they have been evaluated comprehensively by a multidisciplinary team of specialists coordinated through the [facility] in the [facility].  They have been demonstrated to suffer from symptomatic severe aortic stenosis as noted above. The patient has been counseled extensively as to the relative risks and benefits of all options for the treatment of severe aortic stenosis including long term medical therapy, conventional surgery for aortic valve replacement, and transcatheter aortic valve replacement.  All questions have been answered, and the patient provides full informed consent for the operation as described.   DETAILS OF THE OPERATIVE PROCEDURE  PREPARATION:    The patient is brought to the operating room on the above mentioned date and central monitoring was established by the anesthesia team including placement of a central venous line and radial arterial line. The patient is placed in the supine position on the operating table.  Intravenous antibiotics are administered.  General endotracheal anesthesia is induced uneventfully.  A Foley catheter is placed.  Baseline transesophageal echocardiogram was performed. The patient's chest, abdomen, both groins, and both lower extremities are prepared and draped in a sterile manner. A time out procedure is performed.   PERIPHERAL ACCESS:    Using the modified Seldinger technique, femoral arterial and venous access was obtained with placement of 6 Fr sheaths on the left side.  A pigtail diagnostic catheter was passed through the left arterial sheath under fluoroscopic guidance into the aortic root.  A temporary transvenous pacemaker catheter was passed through the left femoral venous sheath under fluoroscopic guidance into the right ventricle.  The pacemaker was tested to  ensure stable lead placement and pacemaker capture. Aortic root angiography was performed in order to determine the optimal angiographic angle for valve deployment.   TRANSFEMORAL ACCESS:   Percutaneous transfemoral access and sheath placement was performed by Dr. Angelena Form using ultrasound guidance.  The right common femoral artery was cannulated using a micropuncture needle and appropriate location was verified using hand injection angiogram.  A pair of Abbott Perclose percutaneous closure devices were placed and a 6 French sheath replaced into the femoral artery.  The patient was heparinized systemically and ACT verified > 250 seconds.    A 16 Fr transfemoral E-sheath was introduced into the right common femoral artery after progressively dilating over an Amplatz superstiff wire. An AL-2 catheter was used to direct a straight-tip exchange length wire across the native aortic valve into the left ventricle. This was exchanged out for a pigtail catheter and position was confirmed in the LV apex. Simultaneous LV and Ao pressures were recorded.  The pigtail catheter was exchanged for an Amplatz Extra-stiff wire in the LV apex.  Echocardiography was utilized to confirm appropriate wire position and no sign of entanglement in the mitral subvalvular apparatus.   TRANSCATHETER HEART VALVE DEPLOYMENT:   An Edwards Sapien 3 transcatheter heart valve (size 29 mm, model #[id number], serial #[id number]) was prepared and crimped per manufacturer's guidelines, and the proper orientation of the valve is confirmed on the [employer] delivery system. The valve was advanced through the introducer sheath using normal technique until in an appropriate position in the abdominal aorta beyond the sheath tip. The balloon was then retracted and using the fine-tuning wheel was centered on the valve. The valve was then advanced across the aortic arch using appropriate flexion of the catheter. The valve was carefully positioned  across the aortic valve  annulus. The Commander catheter was retracted using normal technique. Once final position of the valve has been confirmed by angiographic assessment, the valve is deployed while temporarily holding ventilation and during rapid ventricular pacing to maintain systolic blood pressure < 50 mmHg and pulse pressure < 10 mmHg. The balloon inflation is held for >3 seconds after reaching full deployment volume. Once the balloon has fully deflated the balloon is retracted into the ascending aorta and valve function is assessed using echocardiography. There is felt to be no paravalvular leak and no central aortic insufficiency.  The patient's hemodynamic recovery following valve deployment is good.  The deployment balloon and guidewire are both removed.    PROCEDURE COMPLETION:   The sheath was removed and femoral artery closure performed by Dr Angelena Form.  Protamine was administered once femoral arterial repair was complete. The temporary pacemaker, pigtail catheters and femoral sheaths were removed with manual pressure used for hemostasis.   The patient tolerated the procedure well and is transported to the surgical intensive care in stable condition. There were no immediate intraoperative complications. All sponge instrument and needle counts are verified correct at completion of the operation.   No blood products were administered during the operation.  The patient received a total of 31.8 mL of intravenous contrast during the procedure.   Rexene Alberts, MD 12/02/2017 9:38 AM

## 2017-12-02 NOTE — Anesthesia Preprocedure Evaluation (Signed)
Anesthesia Evaluation  Patient identified by MRN, date of birth, ID band Patient awake    Reviewed: Allergy & Precautions, H&P , NPO status , Patient's Chart, lab work & pertinent test results  Airway Mallampati: II   Neck ROM: full    Dental   Pulmonary sleep apnea , former smoker,    breath sounds clear to auscultation       Cardiovascular hypertension, + CAD, + Past MI, + Peripheral Vascular Disease and +CHF  + dysrhythmias Atrial Fibrillation + Valvular Problems/Murmurs AS  Rhythm:regular Rate:Normal  Severe AS   Neuro/Psych    GI/Hepatic   Endo/Other  diabetes, Type 2Hypothyroidism   Renal/GU Renal InsufficiencyRenal disease     Musculoskeletal   Abdominal   Peds  Hematology   Anesthesia Other Findings   Reproductive/Obstetrics                             Anesthesia Physical Anesthesia Plan  ASA: III  Anesthesia Plan: General   Post-op Pain Management:    Induction: Intravenous  PONV Risk Score and Plan: 2 and Ondansetron, Dexamethasone and Treatment may vary due to age or medical condition  Airway Management Planned: Oral ETT  Additional Equipment: Arterial line, CVP, Ultrasound Guidance Line Placement and TEE  Intra-op Plan:   Post-operative Plan: Extubation in OR and Possible Post-op intubation/ventilation  Informed Consent: I have reviewed the patients History and Physical, chart, labs and discussed the procedure including the risks, benefits and alternatives for the proposed anesthesia with the patient or authorized representative who has indicated his/her understanding and acceptance.     Plan Discussed with: CRNA, Anesthesiologist and Surgeon  Anesthesia Plan Comments:         Anesthesia Quick Evaluation

## 2017-12-02 NOTE — Interval H&P Note (Signed)
History and Physical Interval Note:  12/02/2017 7:00 AM  Steven Maldonado  has presented today for surgery, with the diagnosis of Severe aortic stenosis  The various methods of treatment have been discussed with the patient and family. After consideration of risks, benefits and other options for treatment, the patient has consented to  Procedure(s): TRANSCATHETER AORTIC VALVE REPLACEMENT, TRANSFEMORAL (N/A) TRANSESOPHAGEAL ECHOCARDIOGRAM (TEE) (N/A) as a surgical intervention .  The patient's history has been reviewed, patient examined, no change in status, stable for surgery.  I have reviewed the patient's chart and labs.  Questions were answered to the patient's satisfaction.     [name]

## 2017-12-02 NOTE — Anesthesia Postprocedure Evaluation (Signed)
Anesthesia Post Note  Patient: Steven Maldonado  Procedure(s) Performed: TRANSCATHETER AORTIC VALVE REPLACEMENT, TRANSFEMORAL (N/A Chest) TRANSESOPHAGEAL ECHOCARDIOGRAM (TEE) (N/A Chest)     Patient location during evaluation: ICU Anesthesia Type: General Level of consciousness: awake and alert Pain management: pain level controlled Vital Signs Assessment: post-procedure vital signs reviewed and stable Respiratory status: spontaneous breathing, nonlabored ventilation, respiratory function stable and patient connected to nasal cannula oxygen Cardiovascular status: blood pressure returned to baseline and stable Postop Assessment: no apparent nausea or vomiting Anesthetic complications: no    Last Vitals:  Vitals:   12/02/17 1600 12/02/17 1630  BP: (!) 115/58   Pulse:    Resp: 14 15  Temp:    SpO2: 100% 100%    Last Pain:  Vitals:   12/02/17 1500  TempSrc: Oral  PainSc:                  [place] S

## 2017-12-02 NOTE — Anesthesia Procedure Notes (Signed)
Arterial Line Insertion Start/End1/22/2019 7:05 AM, 12/02/2017 7:30 AM Performed by: Albertha Ghee, MD, anesthesiologist  Patient location: Pre-op. Preanesthetic checklist: patient identified, IV checked, site marked, risks and benefits discussed, surgical consent, monitors and equipment checked, pre-op evaluation, timeout performed and anesthesia consent Lidocaine 1% used for infiltration and patient sedated Right, brachial was placed Catheter size: 20 G Hand hygiene performed , maximum sterile barriers used  and Seldinger technique used  Attempts: 3 Procedure performed using ultrasound guided technique. Ultrasound Notes:anatomy identified, needle tip was noted to be adjacent to the nerve/plexus identified and no ultrasound evidence of intravascular and/or intraneural injection Following insertion, line sutured, dressing applied and Biopatch. Post procedure assessment: normal  Patient tolerated the procedure well with no immediate complications. Additional procedure comments: Left radial artery attempted X 2 by Marianna Payment CRNA. First attempt with pulsatile flow through needle but no flow through catheter. Repeated X 4 with same results. Second attempt unable to locate artery with needle. Dr Marcie Bal notified and placed in right brachial artery X 1 attempt.[name] Kitchen

## 2017-12-02 NOTE — Anesthesia Procedure Notes (Signed)
Procedure Name: Intubation Date/Time: 12/02/2017 7:56 AM Performed by: Moshe Salisbury, CRNA Pre-anesthesia Checklist: Patient identified, Emergency Drugs available, Suction available and Patient being monitored Patient Re-evaluated:Patient Re-evaluated prior to induction Oxygen Delivery Method: Circle System Utilized Preoxygenation: Pre-oxygenation with 100% oxygen Induction Type: IV induction Ventilation: Mask ventilation without difficulty Laryngoscope Size: Mac and 4 Grade View: Grade I Tube type: Oral Tube size: 8.0 mm Number of attempts: 1 Airway Equipment and Method: Stylet Placement Confirmation: ETT inserted through vocal cords under direct vision,  positive ETCO2 and breath sounds checked- equal and bilateral Secured at: 23 cm Tube secured with: Tape Dental Injury: Teeth and Oropharynx as per pre-operative assessment

## 2017-12-02 NOTE — Progress Notes (Signed)
Inpatient Diabetes Program Recommendations  AACE/ADA: New Consensus Statement on Inpatient Glycemic Control (2015)  Target Ranges:  Prepandial:   less than 140 mg/dL      Peak postprandial:   less than 180 mg/dL (1-2 hours)      Critically ill patients:  140 - 180 mg/dL   Lab Results  Component Value Date   GLUCAP 178 (H) 12/02/2017   HGBA1C 8.1 (H) 11/28/2017    Review of Glycemic Control  Results for TATEM, FESLER (MRN [id number]) as of 12/02/2017 12:49  Ref. Range 11/28/2017 10:21 12/02/2017 06:13  Glucose-Capillary Latest Ref Range: 65 - 99 mg/dL 187 (H) 178 (H)    Diabetes history: Type 2 Outpatient Diabetes medications: Glucotrol XL 10 mg qday, Basaglar 65 units qhs, Humalog 5 units tid Current orders for Inpatient glycemic control: Novolog 0-20 units q4h  Inpatient Diabetes Program Recommendations: BUN 25, Creatinine 1.98, GFR 32.                    * May need to re-evaluate order for Novolog 0-20 units q4h (based on renal function).   Gentry Fitz, RN, BA, MHA, CDE Diabetes Coordinator Inpatient Diabetes Program  [phone] (Team Pager) [phone] ([place]) 12/02/2017 1:04 PM

## 2017-12-02 NOTE — Progress Notes (Signed)
      [address]       [place] [address]             [phone]      S/p TAVR  Pacer dependent  BP (!) 130/57 (BP Location: Left Arm)   Pulse (!) 39   Temp 97.8 F (36.6 C) (Oral)   Resp 15   Ht 5\' 11"  (1.803 m)   Wt 251 lb (113.9 kg)   SpO2 97%   BMI 35.01 kg/m   Intake/Output Summary (Last 24 hours) at 12/02/2017 1752 Last data filed at 12/02/2017 1723 Gross per 24 hour  Intake 2135.91 ml  Output 1025 ml  Net 1110.91 ml   Doing well early postop  Remo Lipps C. Roxan Hockey, MD [facility] [phone]

## 2017-12-02 NOTE — Transfer of Care (Signed)
Immediate Anesthesia Transfer of Care Note  Patient: [name]  Procedure(s) Performed: TRANSCATHETER AORTIC VALVE REPLACEMENT, TRANSFEMORAL (N/A Chest) TRANSESOPHAGEAL ECHOCARDIOGRAM (TEE) (N/A Chest)  Patient Location: ICU  Anesthesia Type:General  Level of Consciousness: awake and patient cooperative  Airway & Oxygen Therapy: Patient Spontanous Breathing and Patient connected to nasal cannula oxygen  Post-op Assessment: Report given to RN, Post -op Vital signs reviewed and stable and Patient moving all extremities  Post vital signs: Reviewed and stable  Last Vitals:  Vitals:   12/02/17 0609 12/02/17 0856  BP: (!) 152/61   Pulse: 60 (!) 39  Resp: 18   Temp: 36.6 C   SpO2: 99%     Last Pain:  Vitals:   12/02/17 0609  TempSrc: Oral      Patients Stated Pain Goal: 2 (45/03/88 8280)  Complications: No apparent anesthesia complications

## 2017-12-03 ENCOUNTER — Inpatient Hospital Stay (HOSPITAL_COMMUNITY): Payer: Medicare Other

## 2017-12-03 ENCOUNTER — Encounter (HOSPITAL_COMMUNITY): Payer: Self-pay | Admitting: Cardiovascular Disease

## 2017-12-03 DIAGNOSIS — Z952 Presence of prosthetic heart valve: Secondary | ICD-10-CM

## 2017-12-03 DIAGNOSIS — I340 Nonrheumatic mitral (valve) insufficiency: Secondary | ICD-10-CM

## 2017-12-03 LAB — ECHOCARDIOGRAM LIMITED
AO mean calculated velocity dopler: 81.8 cm/s
AV Area VTI index: 1.03 cm2/m2
AV Area VTI: 2.6 cm2
AV Area mean vel: 2.43 cm2
AV Mean grad: 3 mmHg
AV Peak grad: 7 mmHg
AV VEL mean LVOT/AV: 0.59
AV area mean vel ind: 1.03 cm2/m2
AV peak Index: 1.1
AV pk vel: 129 cm/s
AV vel: 2.42
Ao pk vel: 0.63 m/s
E decel time: 338 msec
E/e' ratio: 22.55
Height: 71 in
LV E/e' medial: 22.55
LV E/e'average: 22.55
LV dias vol index: 56 mL/m2
LV dias vol: 132 mL (ref 62–150)
LV e' LATERAL: 4.79 cm/s
LV sys vol index: 24 mL/m2
LV sys vol: 57 mL (ref 21–61)
LVOT SV: 60 mL
LVOT VTI: 14.4 cm
LVOT area: 4.15 cm2
LVOT diameter: 23 mm
LVOT peak VTI: 0.58 cm
LVOT peak vel: 80.9 cm/s
MV Dec: 338
MV Peak grad: 5 mmHg
MV pk A vel: 118 m/s
MV pk E vel: 108 m/s
Simpson's disk: 57
Stroke v: 75 ml
TDI e' lateral: 4.79
TDI e' medial: 5.11
VTI: 24.7 cm
Valve area index: 1.03
Valve area: 2.42 cm2
Weight: 4190.5 oz

## 2017-12-03 LAB — BASIC METABOLIC PANEL
Anion gap: 10 (ref 5–15)
BUN: 22 mg/dL — ABNORMAL HIGH (ref 6–20)
CO2: 25 mmol/L (ref 22–32)
Calcium: 9 mg/dL (ref 8.9–10.3)
Chloride: 103 mmol/L (ref 101–111)
Creatinine, Ser: 1.78 mg/dL — ABNORMAL HIGH (ref 0.61–1.24)
GFR calc Af Amer: 42 mL/min — ABNORMAL LOW (ref >60)
GFR calc non Af Amer: 36 mL/min — ABNORMAL LOW (ref >60)
Glucose, Bld: 193 mg/dL — ABNORMAL HIGH (ref 65–99)
Potassium: 3.9 mmol/L (ref 3.5–5.1)
Sodium: 138 mmol/L (ref 135–145)

## 2017-12-03 LAB — GLUCOSE, CAPILLARY
Glucose-Capillary: 104 mg/dL — ABNORMAL HIGH (ref 65–99)
Glucose-Capillary: 129 mg/dL — ABNORMAL HIGH (ref 65–99)
Glucose-Capillary: 175 mg/dL — ABNORMAL HIGH (ref 65–99)
Glucose-Capillary: 197 mg/dL — ABNORMAL HIGH (ref 65–99)
Glucose-Capillary: 198 mg/dL — ABNORMAL HIGH (ref 65–99)
Glucose-Capillary: 211 mg/dL — ABNORMAL HIGH (ref 65–99)

## 2017-12-03 LAB — CBC
HCT: 35.4 % — ABNORMAL LOW (ref 39.0–52.0)
Hemoglobin: 12.2 g/dL — ABNORMAL LOW (ref 13.0–17.0)
MCH: 32.6 pg (ref 26.0–34.0)
MCHC: 34.5 g/dL (ref 30.0–36.0)
MCV: 94.7 fL (ref 78.0–100.0)
Platelets: 148 10*3/uL — ABNORMAL LOW (ref 150–400)
RBC: 3.74 MIL/uL — ABNORMAL LOW (ref 4.22–5.81)
RDW: 13 % (ref 11.5–15.5)
WBC: 11.6 10*3/uL — ABNORMAL HIGH (ref 4.0–10.5)

## 2017-12-03 LAB — MAGNESIUM: Magnesium: 1.9 mg/dL (ref 1.7–2.4)

## 2017-12-03 MED ORDER — PERFLUTREN LIPID MICROSPHERE
1.0000 mL | INTRAVENOUS | Status: AC | PRN
  Administered 2017-12-03: 4.5 mL via INTRAVENOUS
  Filled 2017-12-03: qty 10

## 2017-12-03 MED ORDER — ATROPINE SULFATE 1 MG/10ML IJ SOSY
PREFILLED_SYRINGE | INTRAMUSCULAR | Status: AC
  Filled 2017-12-03: qty 10

## 2017-12-03 MED ORDER — PROMETHAZINE HCL 25 MG/ML IJ SOLN
12.5000 mg | Freq: Four times a day (QID) | INTRAMUSCULAR | Status: DC | PRN
  Administered 2017-12-03 (×2): 12.5 mg via INTRAVENOUS
  Filled 2017-12-03 (×3): qty 1

## 2017-12-03 MED ORDER — METOCLOPRAMIDE HCL 5 MG/ML IJ SOLN
5.0000 mg | Freq: Four times a day (QID) | INTRAMUSCULAR | Status: DC
  Administered 2017-12-03 – 2017-12-05 (×6): 5 mg via INTRAVENOUS
  Filled 2017-12-03 (×6): qty 2

## 2017-12-03 MED FILL — Potassium Chloride Inj 2 mEq/ML: INTRAVENOUS | Qty: 40 | Status: AC

## 2017-12-03 MED FILL — Magnesium Sulfate Inj 50%: INTRAMUSCULAR | Qty: 10 | Status: AC

## 2017-12-03 MED FILL — Heparin Sodium (Porcine) Inj 1000 Unit/ML: INTRAMUSCULAR | Qty: 30 | Status: AC

## 2017-12-03 NOTE — Progress Notes (Signed)
Patient transferred to 4E on tele.  Report given to [place], Therapist, sports.

## 2017-12-03 NOTE — Progress Notes (Signed)
  Echocardiogram 2D Echocardiogram Limited Post TAVR with Definity has been performed.  Darlina Sicilian M 12/03/2017, 11:22 AM

## 2017-12-03 NOTE — Progress Notes (Signed)
Inpatient Diabetes Program Recommendations  AACE/ADA: New Consensus Statement on Inpatient Glycemic Control (2015)  Target Ranges:  Prepandial:   less than 140 mg/dL      Peak postprandial:   less than 180 mg/dL (1-2 hours)      Critically ill patients:  140 - 180 mg/dL   Lab Results  Component Value Date   GLUCAP 197 (H) 12/03/2017   HGBA1C 8.1 (H) 11/28/2017    Review of Glycemic Control  Inpatient Diabetes Program Recommendations:   -Lantus 30 units qd (home dose basal 65 units)  Thank you, Bethena Roys E. Dera Vanaken, RN, MSN, CDE  Diabetes Coordinator Inpatient Glycemic Control Team Team Pager [phone] (8am-5pm) 12/03/2017 10:35 AM

## 2017-12-03 NOTE — H&P (View-Only) (Signed)
[place] VALVE TEAM  Patient Name: Steven Maldonado Date of Encounter: 12/03/2017  Primary Cardiologist: Dr. Angelena Form / Dr. Angelena Form & Dr. Roxy Manns (TAVR)  Hospital Problem List     Principal Problem:   S/P TAVR (transcatheter aortic valve replacement) Active Problems:   Hyperlipidemia   CAD (coronary artery disease)   Diabetes mellitus type 2, uncomplicated (HCC)   Obesity (BMI 30-39.9)   OSA on CPAP   Chronic kidney disease   Diabetes (HCC)   PAD (peripheral artery disease) (HCC)   Essential hypertension   Severe aortic stenosis   Persistent atrial fibrillation ([place])     Subjective   Feeling okay. Had a lot of nausea last night. Breathing okay.   Inpatient Medications    Scheduled Meds: . acetaminophen  1,000 mg Oral Q6H   Or  . acetaminophen (TYLENOL) oral liquid 160 mg/5 mL  1,000 mg Per Tube Q6H  . amiodarone  200 mg Oral Daily  . aspirin EC  81 mg Oral Daily   Or  . aspirin  81 mg Per Tube Daily  . atorvastatin  80 mg Oral q1800  . docusate sodium  100 mg Oral Daily  . fenofibrate  54 mg Oral Daily  . fesoterodine  4 mg Oral Daily  . insulin aspart  0-20 Units Subcutaneous TID WC  . insulin aspart  6 Units Subcutaneous TID WC  . levothyroxine  50 mcg Oral QAC breakfast  . magnesium oxide  400 mg Oral Daily  . mirabegron ER  25 mg Oral Daily  . [START ON 12/04/2017] pantoprazole  40 mg Oral Daily   Continuous Infusions: . sodium chloride 20 mL/hr at 12/02/17 1834  . albumin human    . cefUROXime (ZINACEF)  IV Stopped (12/02/17 2035)  . lactated ringers    . nitroGLYCERIN    . phenylephrine (NEO-SYNEPHRINE) Adult infusion Stopped (12/02/17 1015)   PRN Meds: albumin human, lactated ringers, metoprolol tartrate, midazolam, morphine injection, ondansetron (ZOFRAN) IV, oxyCODONE, promethazine, traMADol   Vital Signs    Vitals:   12/03/17 0400 12/03/17 0500 12/03/17 0600 12/03/17 0700  BP: (!) 117/59 (!) 133/57  129/61 124/60  Pulse:      Resp: 19 18 18 18   Temp:    98.9 F (37.2 C)  TempSrc:    Oral  SpO2: 94% 95% 95% 94%  Weight:  261 lb 14.5 oz (118.8 kg)    Height:        Intake/Output Summary (Last 24 hours) at 12/03/2017 1025 Last data filed at 12/03/2017 0700 Gross per 24 hour  Intake 1641 ml  Output 1600 ml  Net 41 ml   Filed Weights   12/02/17 0621 12/03/17 0500  Weight: 251 lb (113.9 kg) 261 lb 14.5 oz (118.8 kg)    Physical Exam   GEN: Well nourished, well developed, in no acute distress. obese HEENT: Grossly normal.  Neck: Supple, no JVD, carotid bruits, or masses. Cardiac: RR, brady, no murmurs, rubs, or gallops. No clubbing, cyanosis, edema.  Radials/DP/PT 2+ and equal bilaterally.  Respiratory:  Respirations regular and unlabored, clear to auscultation bilaterally. GI: Soft, nontender, nondistended, BS + x 4. MS: no deformity or atrophy. Skin: warm and dry, no rash. Groin sites stable. Temp wire still in place.  Neuro:  Strength and sensation are intact. Psych: AAOx3.  Normal affect.  Labs    CBC Recent Labs    12/02/17 1015 12/03/17 0349  WBC 9.1 11.6*  HGB 11.0*  12.2*  HCT 31.8* 35.4*  MCV 96.1 94.7  PLT 143* 854*   Basic Metabolic Panel Recent Labs    12/02/17 0620  12/02/17 0855 12/02/17 1010 12/03/17 0349  NA 137   < > 138 138 138  K 3.9   < > 4.3 4.4 3.9  CL 103   < > 102  --  103  CO2 21*  --   --   --  25  GLUCOSE 183*   < > 236* 239* 193*  BUN 25*   < > 24*  --  22*  CREATININE 1.98*   < > 1.70*  --  1.78*  CALCIUM 9.5  --   --   --  9.0  MG  --   --   --   --  1.9   < > = values in this interval not displayed.   Liver Function Tests Recent Labs    12/02/17 0620  AST 28  ALT 23  ALKPHOS 34*  BILITOT 0.6  PROT 6.6  ALBUMIN 3.8   No results for input(s): LIPASE, AMYLASE in the last 72 hours. Cardiac Enzymes No results for input(s): CKTOTAL, CKMB, CKMBINDEX, TROPONINI in the last 72 hours. BNP Invalid input(s):  POCBNP D-Dimer No results for input(s): DDIMER in the last 72 hours. Hemoglobin A1C No results for input(s): HGBA1C in the last 72 hours. Fasting Lipid Panel No results for input(s): CHOL, HDL, LDLCALC, TRIG, CHOLHDL, LDLDIRECT in the last 72 hours. Thyroid Function Tests No results for input(s): TSH, T4TOTAL, T3FREE, THYROIDAB in the last 72 hours.  Invalid input(s): FREET3  Telemetry    Sinus brady, previously pacing- Personally Reviewed  ECG    paced - Personally Reviewed  Radiology    Dg Chest Port 1 View  Result Date: 12/02/2017 CLINICAL DATA:  Status post TAVR EXAM: PORTABLE CHEST 1 VIEW COMPARISON:  11/28/2017 FINDINGS: 1019 hours. The cardio pericardial silhouette is enlarged. Aortic valve prosthesis noted. Right IJ central line tip is positioned in or just proximal to the innominate vein confluence. There is some patchy opacity in the right suprahilar lung and left base potentially relating to atelectasis or asymmetric edema. No evidence for pneumothorax or substantial pleural effusion. The visualized bony structures of the thorax are intact. Telemetry leads overlie the chest. IMPRESSION: 1. Cardiomegaly with patchy opacity in the right suprahilar lung and left base. This may be atelectasis or asymmetric edema. Electronically Signed   By: Misty Stanley M.D.   On: 12/02/2017 10:42    Cardiac Studies   TAVR OPERATIVE NOTE   Date of Procedure:                12/02/2017 Procedure:        Transcatheter Aortic Valve Replacement - Percutaneous Right Transfemoral Approach             Edwards Sapien 3 THV (size 29 mm, model # 9600TFX, serial # [id number])              Co-Surgeons:                        [name], MD and Valentina Gu. Roxy Manns, MD   Pre-operative Echo Findings: ? Severe aortic stenosis ? Normal left ventricular systolic function  Post-operative Echo Findings: ? No paravalvular leak ? Normal left ventricular systolic  function   ____________  Post operative echo 12/03/17: pending  Patient Profile     Steven Maldonado is a 77 y.o. male  with a history of CAD, PAF on amio and pradaxa, HTN, chronic diastolic CHF, HTN, HLD, IDDM, CKD stage III, PVD, OSA on CPAP, morbid obesity, and severe AS who presented to [facility] on 12/02/17 for planned TAVR.   Assessment & Plan    Severe AS: s/p successful TAVR with a 29 mm Edwards Sapien THV via the R TF approach. Post operative echo pending today. Groin sites stable. He had some post operative bradycardia and temp wire was left in. Currently HR in sinus bradycardia. Plan to keep in ICU for now. Will watch rhythm closely. If he continues to do well we may be able to pull out temp pacer and transfer later. Continue ASA only for now. Will resume home pradaxa tomorrow if rhythm remains stable.   Nausea and vomiting: continue anitemetics    HTN: BP currently well controlled.   PAF: he presented to his surgery in atrial fib with slow VR. Now back in sinus bradycardia. Home Lopressor on hold. Continue home amiodarone 200mg  daily. I will continue to hold Pradaxa at this time. Plan to resume tomorrow if rhythm remains stable.    CKD: creat stable at 1.78. Continue to monitor  DMT2: continue SSI insulin  OSA: continue CPAP  CAD: continue medical therapy.   Signed, Angelena Form, [name]  12/03/2017, 10:25 AM  Pager [phone]   I have personally seen and examined this patient with Angelena Form, PA. I agree with the assessment and plan as outlined above. He is stable this am. Some nausea. BP is stable. HR is stable this am. He is in atrial fib. HR 55-59 bpm. No need for temp pacer. Renal function slightly improved. Echo today. Remove temp wire later in am if HR stable.   [name] 12/03/2017 11:20 AM

## 2017-12-03 NOTE — Progress Notes (Signed)
[place] VALVE TEAM  Patient Name: Steven Maldonado Date of Encounter: 12/03/2017  Primary Cardiologist: Dr. Angelena Form / Dr. Angelena Form & Dr. Roxy Manns (TAVR)  Hospital Problem List     Principal Problem:   S/P TAVR (transcatheter aortic valve replacement) Active Problems:   Hyperlipidemia   CAD (coronary artery disease)   Diabetes mellitus type 2, uncomplicated (HCC)   Obesity (BMI 30-39.9)   OSA on CPAP   Chronic kidney disease   Diabetes (HCC)   PAD (peripheral artery disease) (HCC)   Essential hypertension   Severe aortic stenosis   Persistent atrial fibrillation ([place])     Subjective   Feeling okay. Had a lot of nausea last night. Breathing okay.   Inpatient Medications    Scheduled Meds: . acetaminophen  1,000 mg Oral Q6H   Or  . acetaminophen (TYLENOL) oral liquid 160 mg/5 mL  1,000 mg Per Tube Q6H  . amiodarone  200 mg Oral Daily  . aspirin EC  81 mg Oral Daily   Or  . aspirin  81 mg Per Tube Daily  . atorvastatin  80 mg Oral q1800  . docusate sodium  100 mg Oral Daily  . fenofibrate  54 mg Oral Daily  . fesoterodine  4 mg Oral Daily  . insulin aspart  0-20 Units Subcutaneous TID WC  . insulin aspart  6 Units Subcutaneous TID WC  . levothyroxine  50 mcg Oral QAC breakfast  . magnesium oxide  400 mg Oral Daily  . mirabegron ER  25 mg Oral Daily  . [START ON 12/04/2017] pantoprazole  40 mg Oral Daily   Continuous Infusions: . sodium chloride 20 mL/hr at 12/02/17 1834  . albumin human    . cefUROXime (ZINACEF)  IV Stopped (12/02/17 2035)  . lactated ringers    . nitroGLYCERIN    . phenylephrine (NEO-SYNEPHRINE) Adult infusion Stopped (12/02/17 1015)   PRN Meds: albumin human, lactated ringers, metoprolol tartrate, midazolam, morphine injection, ondansetron (ZOFRAN) IV, oxyCODONE, promethazine, traMADol   Vital Signs    Vitals:   12/03/17 0400 12/03/17 0500 12/03/17 0600 12/03/17 0700  BP: (!) 117/59 (!) 133/57  129/61 124/60  Pulse:      Resp: 19 18 18 18   Temp:    98.9 F (37.2 C)  TempSrc:    Oral  SpO2: 94% 95% 95% 94%  Weight:  261 lb 14.5 oz (118.8 kg)    Height:        Intake/Output Summary (Last 24 hours) at 12/03/2017 1025 Last data filed at 12/03/2017 0700 Gross per 24 hour  Intake 1641 ml  Output 1600 ml  Net 41 ml   Filed Weights   12/02/17 0621 12/03/17 0500  Weight: 251 lb (113.9 kg) 261 lb 14.5 oz (118.8 kg)    Physical Exam   GEN: Well nourished, well developed, in no acute distress. obese HEENT: Grossly normal.  Neck: Supple, no JVD, carotid bruits, or masses. Cardiac: RR, brady, no murmurs, rubs, or gallops. No clubbing, cyanosis, edema.  Radials/DP/PT 2+ and equal bilaterally.  Respiratory:  Respirations regular and unlabored, clear to auscultation bilaterally. GI: Soft, nontender, nondistended, BS + x 4. MS: no deformity or atrophy. Skin: warm and dry, no rash. Groin sites stable. Temp wire still in place.  Neuro:  Strength and sensation are intact. Psych: AAOx3.  Normal affect.  Labs    CBC Recent Labs    12/02/17 1015 12/03/17 0349  WBC 9.1 11.6*  HGB 11.0*  12.2*  HCT 31.8* 35.4*  MCV 96.1 94.7  PLT 143* 295*   Basic Metabolic Panel Recent Labs    12/02/17 0620  12/02/17 0855 12/02/17 1010 12/03/17 0349  NA 137   < > 138 138 138  K 3.9   < > 4.3 4.4 3.9  CL 103   < > 102  --  103  CO2 21*  --   --   --  25  GLUCOSE 183*   < > 236* 239* 193*  BUN 25*   < > 24*  --  22*  CREATININE 1.98*   < > 1.70*  --  1.78*  CALCIUM 9.5  --   --   --  9.0  MG  --   --   --   --  1.9   < > = values in this interval not displayed.   Liver Function Tests Recent Labs    12/02/17 0620  AST 28  ALT 23  ALKPHOS 34*  BILITOT 0.6  PROT 6.6  ALBUMIN 3.8   No results for input(s): LIPASE, AMYLASE in the last 72 hours. Cardiac Enzymes No results for input(s): CKTOTAL, CKMB, CKMBINDEX, TROPONINI in the last 72 hours. BNP Invalid input(s):  POCBNP D-Dimer No results for input(s): DDIMER in the last 72 hours. Hemoglobin A1C No results for input(s): HGBA1C in the last 72 hours. Fasting Lipid Panel No results for input(s): CHOL, HDL, LDLCALC, TRIG, CHOLHDL, LDLDIRECT in the last 72 hours. Thyroid Function Tests No results for input(s): TSH, T4TOTAL, T3FREE, THYROIDAB in the last 72 hours.  Invalid input(s): FREET3  Telemetry    Sinus brady, previously pacing- Personally Reviewed  ECG    paced - Personally Reviewed  Radiology    Dg Chest Port 1 View  Result Date: 12/02/2017 CLINICAL DATA:  Status post TAVR EXAM: PORTABLE CHEST 1 VIEW COMPARISON:  11/28/2017 FINDINGS: 1019 hours. The cardio pericardial silhouette is enlarged. Aortic valve prosthesis noted. Right IJ central line tip is positioned in or just proximal to the innominate vein confluence. There is some patchy opacity in the right suprahilar lung and left base potentially relating to atelectasis or asymmetric edema. No evidence for pneumothorax or substantial pleural effusion. The visualized bony structures of the thorax are intact. Telemetry leads overlie the chest. IMPRESSION: 1. Cardiomegaly with patchy opacity in the right suprahilar lung and left base. This may be atelectasis or asymmetric edema. Electronically Signed   By: Misty Stanley M.D.   On: 12/02/2017 10:42    Cardiac Studies   TAVR OPERATIVE NOTE   Date of Procedure:                12/02/2017 Procedure:        Transcatheter Aortic Valve Replacement - Percutaneous Right Transfemoral Approach             Edwards Sapien 3 THV (size 29 mm, model # 9600TFX, serial # [id number])              Co-Surgeons:                        [name], MD and Valentina Gu. Roxy Manns, MD   Pre-operative Echo Findings: ? Severe aortic stenosis ? Normal left ventricular systolic function  Post-operative Echo Findings: ? No paravalvular leak ? Normal left ventricular systolic  function   ____________  Post operative echo 12/03/17: pending  Patient Profile     Steven Maldonado is a 77 y.o. male  with a history of CAD, PAF on amio and pradaxa, HTN, chronic diastolic CHF, HTN, HLD, IDDM, CKD stage III, PVD, OSA on CPAP, morbid obesity, and severe AS who presented to [facility] on 12/02/17 for planned TAVR.   Assessment & Plan    Severe AS: s/p successful TAVR with a 29 mm Edwards Sapien THV via the R TF approach. Post operative echo pending today. Groin sites stable. He had some post operative bradycardia and temp wire was left in. Currently HR in sinus bradycardia. Plan to keep in ICU for now. Will watch rhythm closely. If he continues to do well we may be able to pull out temp pacer and transfer later. Continue ASA only for now. Will resume home pradaxa tomorrow if rhythm remains stable.   Nausea and vomiting: continue anitemetics    HTN: BP currently well controlled.   PAF: he presented to his surgery in atrial fib with slow VR. Now back in sinus bradycardia. Home Lopressor on hold. Continue home amiodarone 200mg  daily. I will continue to hold Pradaxa at this time. Plan to resume tomorrow if rhythm remains stable.    CKD: creat stable at 1.78. Continue to monitor  DMT2: continue SSI insulin  OSA: continue CPAP  CAD: continue medical therapy.   Signed, Angelena Form, [name]  12/03/2017, 10:25 AM  Pager [phone]   I have personally seen and examined this patient with Angelena Form, PA. I agree with the assessment and plan as outlined above. He is stable this am. Some nausea. BP is stable. HR is stable this am. He is in atrial fib. HR 55-59 bpm. No need for temp pacer. Renal function slightly improved. Echo today. Remove temp wire later in am if HR stable.   [name] 12/03/2017 11:20 AM

## 2017-12-04 ENCOUNTER — Inpatient Hospital Stay (HOSPITAL_COMMUNITY): Payer: Medicare Other

## 2017-12-04 ENCOUNTER — Inpatient Hospital Stay (HOSPITAL_COMMUNITY)
Admission: RE | Disposition: A | Discharge status: Home / Self Care | Payer: Self-pay | Source: Ambulatory Visit | Attending: Cardiovascular Disease

## 2017-12-04 ENCOUNTER — Encounter (HOSPITAL_COMMUNITY)
Admission: RE | Disposition: A | Discharge status: Home / Self Care | Payer: Self-pay | Source: Ambulatory Visit | Attending: Cardiovascular Disease

## 2017-12-04 DIAGNOSIS — I481 Persistent atrial fibrillation: Secondary | ICD-10-CM

## 2017-12-04 DIAGNOSIS — I469 Cardiac arrest, cause unspecified: Secondary | ICD-10-CM

## 2017-12-04 DIAGNOSIS — I442 Atrioventricular block, complete: Secondary | ICD-10-CM

## 2017-12-04 DIAGNOSIS — R001 Bradycardia, unspecified: Secondary | ICD-10-CM

## 2017-12-04 HISTORY — PX: TEMPORARY PACEMAKER: CATH118268

## 2017-12-04 HISTORY — PX: PACEMAKER IMPLANT: EP1218

## 2017-12-04 LAB — GLUCOSE, CAPILLARY
Glucose-Capillary: 171 mg/dL — ABNORMAL HIGH (ref 65–99)
Glucose-Capillary: 244 mg/dL — ABNORMAL HIGH (ref 65–99)
Glucose-Capillary: 204 mg/dL — ABNORMAL HIGH (ref 65–99)
Glucose-Capillary: 223 mg/dL — ABNORMAL HIGH (ref 65–99)
Glucose-Capillary: 203 mg/dL — ABNORMAL HIGH (ref 65–99)

## 2017-12-04 SURGERY — PACEMAKER IMPLANT

## 2017-12-04 SURGERY — TEMPORARY PACEMAKER
Anesthesia: LOCAL

## 2017-12-04 MED ORDER — MIDAZOLAM HCL 5 MG/5ML IJ SOLN
INTRAMUSCULAR | Status: DC | PRN
  Administered 2017-12-04 (×4): 1 mg via INTRAVENOUS

## 2017-12-04 MED ORDER — HEPARIN (PORCINE) IN NACL 2-0.9 UNIT/ML-% IJ SOLN
INTRAMUSCULAR | Status: AC | PRN
  Administered 2017-12-04: 500 mL

## 2017-12-04 MED ORDER — FENTANYL CITRATE (PF) 100 MCG/2ML IJ SOLN
INTRAMUSCULAR | Status: DC | PRN
  Administered 2017-12-04 (×5): 25 ug via INTRAVENOUS

## 2017-12-04 MED ORDER — LIDOCAINE HCL (PF) 1 % IJ SOLN
INTRAMUSCULAR | Status: AC
  Filled 2017-12-04: qty 60

## 2017-12-04 MED ORDER — ACETAMINOPHEN 325 MG PO TABS: 325.0000 mg | ORAL_TABLET | ORAL | Status: DC | PRN

## 2017-12-04 MED ORDER — LIDOCAINE HCL 1 % IJ SOLN
INTRAMUSCULAR | Status: AC
  Filled 2017-12-04: qty 20

## 2017-12-04 MED ORDER — FENTANYL CITRATE (PF) 100 MCG/2ML IJ SOLN
INTRAMUSCULAR | Status: AC
  Filled 2017-12-04: qty 2

## 2017-12-04 MED ORDER — CEFAZOLIN SODIUM-DEXTROSE 2-4 GM/100ML-% IV SOLN
2.0000 g | INTRAVENOUS | Status: AC
  Administered 2017-12-04: 2 g via INTRAVENOUS

## 2017-12-04 MED ORDER — CHLORHEXIDINE GLUCONATE 4 % EX LIQD: 60.0000 mL | Freq: Once | CUTANEOUS | Status: DC

## 2017-12-04 MED ORDER — ONDANSETRON HCL 4 MG/2ML IJ SOLN: 4.0000 mg | Freq: Four times a day (QID) | INTRAMUSCULAR | Status: DC | PRN

## 2017-12-04 MED ORDER — LIDOCAINE HCL (PF) 1 % IJ SOLN
INTRAMUSCULAR | Status: DC | PRN
  Administered 2017-12-04: 60 mL

## 2017-12-04 MED ORDER — CHLORHEXIDINE GLUCONATE 4 % EX LIQD
60.0000 mL | Freq: Once | CUTANEOUS | Status: DC
  Filled 2017-12-04: qty 30

## 2017-12-04 MED ORDER — SODIUM CHLORIDE 0.9 % IR SOLN
80.0000 mg | Status: AC
  Administered 2017-12-04: 80 mg
  Filled 2017-12-04: qty 2

## 2017-12-04 MED ORDER — HEPARIN (PORCINE) IN NACL 2-0.9 UNIT/ML-% IJ SOLN
INTRAMUSCULAR | Status: AC
  Filled 2017-12-04: qty 500

## 2017-12-04 MED ORDER — LIDOCAINE HCL (PF) 1 % IJ SOLN
INTRAMUSCULAR | Status: DC | PRN
  Administered 2017-12-04: 20 mL

## 2017-12-04 MED ORDER — SODIUM CHLORIDE 0.9 % IR SOLN
Status: AC
  Filled 2017-12-04: qty 2

## 2017-12-04 MED ORDER — SODIUM CHLORIDE 0.9% FLUSH: 3.0000 mL | INTRAVENOUS | Status: DC | PRN

## 2017-12-04 MED ORDER — MIDAZOLAM HCL 5 MG/5ML IJ SOLN
INTRAMUSCULAR | Status: AC
  Filled 2017-12-04: qty 5

## 2017-12-04 MED ORDER — SODIUM CHLORIDE 0.9 % IV SOLN: 250.0000 mL | INTRAVENOUS | Status: DC

## 2017-12-04 MED ORDER — SODIUM CHLORIDE 0.9 % IV SOLN: INTRAVENOUS | Status: DC

## 2017-12-04 MED ORDER — SODIUM CHLORIDE 0.9% FLUSH: 3.0000 mL | Freq: Two times a day (BID) | INTRAVENOUS | Status: DC

## 2017-12-04 MED ORDER — CEFAZOLIN SODIUM-DEXTROSE 1-4 GM/50ML-% IV SOLN
1.0000 g | Freq: Four times a day (QID) | INTRAVENOUS | Status: AC
  Administered 2017-12-04 – 2017-12-05 (×3): 1 g via INTRAVENOUS
  Filled 2017-12-04 (×3): qty 50

## 2017-12-04 MED ORDER — CEFAZOLIN SODIUM-DEXTROSE 2-4 GM/100ML-% IV SOLN
INTRAVENOUS | Status: AC
  Filled 2017-12-04: qty 100

## 2017-12-04 SURGICAL SUPPLY — 8 items
CABLE SURGICAL S-101-97-12 (CABLE) ×2 IMPLANT
IPG PACE AZUR XT DR MRI W1DR01 (Pacemaker) ×1 IMPLANT
LEAD CAPSURE NOVUS 5076-52CM (Lead) ×2 IMPLANT
LEAD CAPSURE NOVUS 5076-58CM (Lead) ×2 IMPLANT
PACE AZURE XT DR MRI W1DR01 (Pacemaker) ×2 IMPLANT
PAD DEFIB LIFELINK (PAD) ×2 IMPLANT
SHEATH CLASSIC 7F (SHEATH) ×4 IMPLANT
TRAY PACEMAKER INSERTION (PACKS) ×2 IMPLANT

## 2017-12-04 SURGICAL SUPPLY — 7 items
CATH S G BIP PACING (SET/KITS/TRAYS/PACK) ×2 IMPLANT
COVER PRB 48X5XTLSCP FOLD TPE (BAG) ×1 IMPLANT
COVER PROBE 5X48 (BAG) ×1
PACK CARDIAC CATHETERIZATION (CUSTOM PROCEDURE TRAY) ×2 IMPLANT
PROTECTION STATION PRESSURIZED (MISCELLANEOUS) ×2
SHEATH PINNACLE 6F 10CM (SHEATH) ×2 IMPLANT
STATION PROTECTION PRESSURIZED (MISCELLANEOUS) ×1 IMPLANT

## 2017-12-04 NOTE — Progress Notes (Signed)
Pt getting set up for breakfast in recliner and lost pulse. Prior to losing pulse, pt was having conversation with staff and taking oral synthroid. Code blue called. Pt transferred to bed by staff. Rapid and cardiologist at bedside. After transferred to bed, pt became alert and able to talk. Pulse returned junctional at rate of 28. Atropine administered and dopamine drip started. Pt transferred to cath lab by rapid response, AC, and cardiologist.   Grant Fontana BSN, RN

## 2017-12-04 NOTE — Consult Note (Signed)
            [facility] CM Primary Care Navigator  12/04/2017  Steven Maldonado 14-Jan-1941 [id number]   [name] to see patient at the bedside to identify possible discharge needs but RN reports that patient was transferred to another unit after he coded.  Per MD note, patient had an episode of syncope and found to be in asystole, then, heart rate returned to junctional bradycardia. Patient was emergently taken to cath lab for temporary wire placement.  Will attempt to see patient at another time when condition is more stable.    Addendum (12/05/17):  Went back to see patient in the room (new) to identify possible discharge needs (post pacemaker placement) but staff reports that he was already discharged.  Inpatient CM note shows that patient was discharged home today.  Primary Care provider's office called (Tiffany) to notify of his discharge and need for post hospital follow-up and transition of care (TOC).  Made aware to refer patient to [facility] care management if deemed necessary or appropriate for services.  Patient has discharge instruction to follow-up with cardiology on 12/11/17.   For additional questions please contact:  Edwena Felty A. Graiden Henes, BSN, RN-BC [facility] PRIMARY CARE Navigator Cell: [phone]

## 2017-12-04 NOTE — Progress Notes (Signed)
[facility] TE[facility]  Patient Name: Steven Maldonado Date of Encounter: 12/04/2017  Primary Cardiologist: Dr. Angelena Form / Dr. Angelena Form & Dr. Roxy Manns (TAVR)  Hospital Problem List     Principal Problem:   S/P TAVR (transcatheter aortic valve replacement) Active Problems:   Hyperlipidemia   CAD (coronary artery disease)   Diabetes mellitus type 2, uncomplicated (HCC)   Obesity (BMI 30-39.9)   OSA on CPAP   Chronic kidney disease   Diabetes ([place])   PAD (peripheral artery disease) (HCC)   Essential hypertension   Severe aortic stenosis   Persistent atrial fibrillation ([place])     Subjective   Had an episode of syncope while eating breakfast and found to be in asystole. Then HR returned to junctional bradycardia. External pacing pads placed. He was emergently taken to cath lab for temp wire placement  Inpatient Medications    Scheduled Meds: . [MAR Hold] acetaminophen  1,000 mg Oral Q6H   Or  . [MAR Hold] acetaminophen (TYLENOL) oral liquid 160 mg/5 mL  1,000 mg Per Tube Q6H  . [MAR Hold] amiodarone  200 mg Oral Daily  . [MAR Hold] aspirin EC  81 mg Oral Daily   Or  . [MAR Hold] aspirin  81 mg Per Tube Daily  . [MAR Hold] atorvastatin  80 mg Oral q1800  . [MAR Hold] docusate sodium  100 mg Oral Daily  . [MAR Hold] fenofibrate  54 mg Oral Daily  . [MAR Hold] fesoterodine  4 mg Oral Daily  . [MAR Hold] insulin aspart  0-20 Units Subcutaneous TID WC  . [MAR Hold] insulin aspart  6 Units Subcutaneous TID WC  . [MAR Hold] levothyroxine  50 mcg Oral QAC breakfast  . [MAR Hold] magnesium oxide  400 mg Oral Daily  . [MAR Hold] metoCLOPramide (REGLAN) injection  5 mg Intravenous Q6H  . [MAR Hold] mirabegron ER  25 mg Oral Daily  . [MAR Hold] pantoprazole  40 mg Oral Daily   Continuous Infusions: . sodium chloride 50 mL/hr at 12/04/17 0654  . cefUROXime (ZINACEF)  IV Stopped (12/03/17 2120)  . [MAR Hold] lactated ringers     PRN  Meds: [MAR Hold] lactated ringers, lidocaine (PF), [MAR Hold] metoprolol tartrate, [MAR Hold] promethazine, [MAR Hold] traMADol   Vital Signs    Vitals:   12/04/17 0451 12/04/17 0815 12/04/17 0820 12/04/17 0835  BP: (!) 127/57 (!) 122/110 (!) 143/51   Pulse: (!) 54     Resp:  13 18   Temp: 99.4 F (37.4 C)     TempSrc: Oral     SpO2:  97%  92%  Weight: 261 lb 14.5 oz (118.8 kg)     Height:        Intake/Output Summary (Last 24 hours) at 12/04/2017 0841 Last data filed at 12/04/2017 0654 Gross per 24 hour  Intake 640 ml  Output 850 ml  Net -210 ml   Filed Weights   12/02/17 0621 12/03/17 0500 12/04/17 0451  Weight: 251 lb (113.9 kg) 261 lb 14.5 oz (118.8 kg) 261 lb 14.5 oz (118.8 kg)    Physical Exam   GEN: Well nourished, well developed, in no acute distress. Obese, disoriented  HEENT: Grossly normal.  Neck: Supple, no JVD, carotid bruits, or masses. Cardiac: RR, brady, no murmurs, rubs, or gallops. No clubbing, cyanosis, edema.  Radials/DP/PT 2+ and equal bilaterally.  Respiratory:  Respirations regular and unlabored, clear to auscultation bilaterally. GI: Soft, nontender, nondistended, BS +  x 4. MS: no deformity or atrophy. Skin: warm and dry, no rash. Groin sites stable.  Neuro:  Strength and sensation are intact. Psych: AAOx3.  Normal affect.  Labs    CBC Recent Labs    12/02/17 1015 12/03/17 0349  WBC 9.1 11.6*  HGB 11.0* 12.2*  HCT 31.8* 35.4*  MCV 96.1 94.7  PLT 143* 163*   Basic Metabolic Panel Recent Labs    12/02/17 0620  12/02/17 0855 12/02/17 1010 12/03/17 0349  NA 137   < > 138 138 138  K 3.9   < > 4.3 4.4 3.9  CL 103   < > 102  --  103  CO2 21*  --   --   --  25  GLUCOSE 183*   < > 236* 239* 193*  BUN 25*   < > 24*  --  22*  CREATININE 1.98*   < > 1.70*  --  1.78*  CALCIUM 9.5  --   --   --  9.0  MG  --   --   --   --  1.9   < > = values in this interval not displayed.   Liver Function Tests Recent Labs    12/02/17 0620  AST 28   ALT 23  ALKPHOS 34*  BILITOT 0.6  PROT 6.6  ALBUMIN 3.8   No results for input(s): LIPASE, AMYLASE in the last 72 hours. Cardiac Enzymes No results for input(s): CKTOTAL, CKMB, CKMBINDEX, TROPONINI in the last 72 hours. BNP Invalid input(s): POCBNP D-Dimer No results for input(s): DDIMER in the last 72 hours. Hemoglobin A1C No results for input(s): HGBA1C in the last 72 hours. Fasting Lipid Panel No results for input(s): CHOL, HDL, LDLCALC, TRIG, CHOLHDL, LDLDIRECT in the last 72 hours. Thyroid Function Tests No results for input(s): TSH, T4TOTAL, T3FREE, THYROIDAB in the last 72 hours.  Invalid input(s): FREET3  Telemetry    Sinus brady with period of asystole followed by junctional bradycardia HR in 20s- Personally Reviewed  ECG    paced - Personally Reviewed  Radiology    Dg Chest Port 1 View  Result Date: 12/02/2017 CLINICAL DATA:  Status post TAVR EXAM: PORTABLE CHEST 1 VIEW COMPARISON:  11/28/2017 FINDINGS: 1019 hours. The cardio pericardial silhouette is enlarged. Aortic valve prosthesis noted. Right IJ central line tip is positioned in or just proximal to the innominate vein confluence. There is some patchy opacity in the right suprahilar lung and left base potentially relating to atelectasis or asymmetric edema. No evidence for pneumothorax or substantial pleural effusion. The visualized bony structures of the thorax are intact. Telemetry leads overlie the chest. IMPRESSION: 1. Cardiomegaly with patchy opacity in the right suprahilar lung and left base. This may be atelectasis or asymmetric edema. Electronically Signed   By: Misty Stanley M.D.   On: 12/02/2017 10:42   Dg Abd Portable 1v  Result Date: 12/03/2017 CLINICAL DATA:  Multiple vomiting episodes today. EXAM: PORTABLE ABDOMEN - 1 VIEW COMPARISON:  Abdominal CT angiogram of October 31, 2017 FINDINGS: There new marked distention of the stomach with fluid and gas. There is no evidence of a small bowel  obstruction. IMPRESSION: Distended stomach which may reflect gastroparesis or gastric outlet obstruction. Nasogastric suction would be useful. Electronically Signed   By: David  Martinique M.D.   On: 12/03/2017 15:54    Cardiac Studies   TAVR OPERATIVE NOTE   Date of Procedure:  12/02/2017 Procedure:        Transcatheter Aortic Valve Replacement - Percutaneous Right Transfemoral Approach             Edwards Sapien 3 THV (size 29 mm, model # 9600TFX, serial # [id number])              Co-Surgeons:                        [name], MD and Valentina Gu. Roxy Manns, MD   Pre-operative Echo Findings: ? Severe aortic stenosis ? Normal left ventricular systolic function  Post-operative Echo Findings: ? No paravalvular leak ? Normal left ventricular systolic function   ____________  Post operative echo 12/03/17  Study Conclusions - (current admission, 12/02/2017). Edwards Sapien 3 THV 29 mm. - Left ventricle: The cavity size was normal. Systolic function was   normal. The estimated ejection fraction was in the range of 55%   to 60%. There is akinesis of the inferoseptal myocardium. Doppler   parameters are consistent with abnormal left ventricular   relaxation (grade 1 diastolic dysfunction). Doppler parameters   are consistent with high ventricular filling pressure. - Aortic valve: A bioprosthesis was present. Peak velocity (S): 129   cm/s. Peak gradient (S): 7 mm Hg. Valve area (VTI): 2.42 cm^2.   Valve area (Vmax): 2.6 cm^2. Valve area (Vmean): 2.43 cm^2. - Mitral valve: Calcified annulus. There was mild regurgitation. Complications:  Temporary Pacemaker still in, patient unable to turn onto his left side.  Patient Profile     Steven Maldonado is a 77 y.o. male with a history of CAD, PAF on amio and pradaxa, HTN, chronic diastolic CHF, HTN, HLD, IDDM, CKD stage III, PVD, OSA on CPAP, morbid obesity, and severe AS who presented to [facility] on 12/02/17 for planned TAVR.    Assessment & Plan    Severe AS: s/p successful TAVR with a 29 mm Edwards Sapien THV via the R TF approach. Post operative echo shows EF 55-60% with normally functioning TAVR valve, no PVL and mean gradient 71mm Hg. Groin sites stable. Continue ASA only for now. He had post operative bradycardia that initially improved and temp wire pulled yesterday. This morning he had an episode of asystole with syncope follow by junctional bradycardia. He was emergently taken to the cath lab for temp write placement. EP will see today and plans to place PPM.   Bradycardia: this morning he had an episode of asystole with syncope follow by junctional bradycardia. Pads placed and given atropine and DA. He was emergently taken to the cath lab for temp write placement. EP will see today and plans to place PPM.   HTN: BP currently well controlled.   PAF: he presented to his surgery in atrial fib with slow VR and then reverted to sinus bradycardia. Home Lopressor on hold. He was continued on home amiodarone 200mg  daily. Hold Pradaxa until after PPM placement.   N/V: KUB showed distended stomach which may reflect gastroparesis or gastric outlet Obstruction.  CKD: creat stable at 1.78. Continue to monitor  DMT2: continue SSI insulin  OSA: continue CPAP  CAD: continue medical therapy.   SignedAngelena Form, [name]  12/04/2017, 8:41 AM  Pager [phone]  I have personally seen and examined this patient. I agree with the assessment and plan as outlined above.  Events of this am noted. Pt with episode of profound bradycardia/asystole/syncope while . He was started on dopamine and brought to the cath lab for  temp wire placement. He is now stable. Plans later today for permanent pacemaker placement. Echo yesterday with normally functioning AVR.  Resume Pradaxa per EP recs following pacemaker placement.   [name] 12/04/2017 9:55 AM

## 2017-12-04 NOTE — Progress Notes (Signed)
Cardiology Consultation:   Patient ID: Steven Maldonado; [id number]; Aug 08, 1941   Admit date: 12/02/2017 Date of Consult: 12/04/2017  Primary Care Provider: [name], [name] Primary Cardiologist: Dr. Angelena Form Electrophysiologist: Dr. Rayann Heman PVD: Dr. Fletcher Anon    Patient Profile:   Steven Maldonado is a 77 y.o. male with a hx of Paroxysmal AFib, DM, HTN, HLD, PAD (R pop art angioplasty 2015), CAD (known occ RCA fill w/collaterals), and severe AS who is being seen today for the evaluation of post TAVR asystolic event/bradycardia at the request of Dr. Angelena Form.  History of Present Illness:   Steven Maldonado was admitted to [facility] 12/02/17 to undergo TAVR, which he had done, did well post-op had some SB in 40's, monitored in ICU, HR remained 50's of BB, on his amiodarone, and temp wires pulled 12/03/17 and moved to stepdown unit.    This AM he developed symptomatic bradycardia with rates 20's and then an asystolic event and emergently had temp wire placed  LABS: K+ 3.9 BUN/Creat 22/1.78 WBC 11.6 H/H 12/35 Plts 148  AFIB hx Originally diagnosed 2012 Not again until 2016 >> DCCV 2018 incidentally noted again to have AF >> June 2018 DCCV though ERAF  Aug 2018 started on amiodarone Dr. Rayann Heman saw in Sept DCCV on amio Oct 2018 AFib clinic visit maintaining SR  MEDS: Amiodarone chronically Last dose lopressor was prior to TAVR (home) Getting Reglan though not likely to have been causative in d/w Dr. Curt Bears  Past Medical History:  Diagnosis Date  . Chronic diastolic CHF (congestive heart failure) ([place])   . CKD (chronic kidney disease), stage III ([place])   . Coronary artery disease    a. Cath February 2012 in [facility] [facility], occluded RCA with collaterals  . DM type 2 (diabetes mellitus, type 2) ([place])   . Essential hypertension   . Hyperlipidemia   . Persistent atrial fibrillation ([place])   . PVD (peripheral vascular disease) ([place])    a. s/p R popliteal artery stenosis tx with drug-coated balloon  05/2014, followed by Dr. Fletcher Anon.  . 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 Severe aortic stenosis    a. 12/02/17: s/p TAVR  . Skin cancer   . Sleep apnea with use of continuous positive airway pressure (CPAP)    04-11-11 AHI was 32.9 and titrated to 15 cm H20, DME is [facility]  . Subclinical hypothyroidism     Past Surgical History:  Procedure Laterality Date  . ABDOMINAL ANGIOGRAM N/A 06/08/2014   Procedure: ABDOMINAL ANGIOGRAM;  Surgeon: Wellington Hampshire, MD;  Location: [facility] CATH LAB;  Service: Cardiovascular;  Laterality: N/A;  . APPENDECTOMY  1965  . CARDIAC CATHETERIZATION  12/2010  . CARDIOVERSION  07/2011  . CARDIOVERSION N/A 04/18/2014   Procedure: CARDIOVERSION;  Surgeon: Dorothy Spark, MD;  Location: [place];  Service: Cardiovascular;  Laterality: N/A;  . CARDIOVERSION N/A 11/03/2015   Procedure: CARDIOVERSION;  Surgeon: Lelon Perla, MD;  Location: [facility] ENDOSCOPY;  Service: Cardiovascular;  Laterality: N/A;  . CARDIOVERSION N/A 05/08/2017   Procedure: CARDIOVERSION;  Surgeon: Dorothy Spark, MD;  Location: [facility] ENDOSCOPY;  Service: Cardiovascular;  Laterality: N/A;  . CARDIOVERSION N/A 07/28/2017   Procedure: CARDIOVERSION;  Surgeon: Dorothy Spark, MD;  Location: [place];  Service: Cardiovascular;  Laterality: N/A;  . LOWER EXTREMITY ANGIOGRAM N/A 06/08/2014   Procedure: LOWER EXTREMITY ANGIOGRAM;  Surgeon: Wellington Hampshire, MD;  Location: [place] CATH LAB;  Service: Cardiovascular;  Laterality: N/A;  . POPLITEAL ARTERY ANGIOPLASTY Right 06/08/2014   Archie Endo 06/08/2014  . RIGHT/LEFT HEART CATH AND CORONARY ANGIOGRAPHY N/A 10/08/2017   Procedure: RIGHT/LEFT HEART CATH AND CORONARY ANGIOGRAPHY;  Surgeon: Burnell Blanks, MD;  Location: [place] CV LAB;  Service: Cardiovascular;  Laterality: N/A;  . SKIN CANCER EXCISION Bilateral    "have had  them cut off back of neck X 2; off left upper arm; right wrist, near right shoulder blade" (06/08/2014)  . TEE WITHOUT CARDIOVERSION N/A 12/02/2017   Procedure: TRANSESOPHAGEAL ECHOCARDIOGRAM (TEE);  Surgeon: Burnell Blanks, MD;  Location: [place];  Service: Open Heart Surgery;  Laterality: N/A;  . TRANSCATHETER AORTIC VALVE REPLACEMENT, TRANSFEMORAL N/A 12/02/2017   Procedure: TRANSCATHETER AORTIC VALVE REPLACEMENT, TRANSFEMORAL;  Surgeon: Burnell Blanks, MD;  Location: [place];  Service: Open Heart Surgery;  Laterality: N/A;  using Edwards Sapien 3 Transcatheter Heart Valve size 93mm       Inpatient Medications: Scheduled Meds: . [MAR Hold] acetaminophen  1,000 mg Oral Q6H   Or  . [MAR Hold] acetaminophen (TYLENOL) oral liquid 160 mg/5 mL  1,000 mg Per Tube Q6H  . [MAR Hold] amiodarone  200 mg Oral Daily  . [MAR Hold] aspirin EC  81 mg Oral Daily   Or  . [MAR Hold] aspirin  81 mg Per Tube Daily  . [MAR Hold] atorvastatin  80 mg Oral q1800  . [MAR Hold] docusate sodium  100 mg Oral Daily  . [MAR Hold] fenofibrate  54 mg Oral Daily  . [MAR Hold] fesoterodine  4 mg Oral Daily  . [MAR Hold] insulin aspart  0-20 Units Subcutaneous TID WC  . [MAR Hold] insulin aspart  6 Units Subcutaneous TID WC  . [MAR Hold] levothyroxine  50 mcg Oral QAC breakfast  . [MAR Hold] magnesium oxide  400 mg Oral Daily  . [MAR Hold] metoCLOPramide (REGLAN) injection  5 mg Intravenous Q6H  . [MAR Hold] mirabegron ER  25 mg Oral Daily  . [MAR Hold] pantoprazole  40 mg Oral Daily   Continuous Infusions: . sodium chloride 50 mL/hr at 12/04/17 0654  . cefUROXime (ZINACEF)  IV Stopped (12/03/17 2120)  . [MAR Hold] lactated ringers     PRN Meds: [MAR Hold] lactated ringers, [MAR Hold] metoprolol tartrate, [MAR Hold] promethazine, [MAR Hold] traMADol  Allergies:   No Known Allergies  Social History:   Social History   Socioeconomic History  . Marital status: Married    Spouse name: [name]  .  Number of children: 1  . Years of education: 73  . Highest education level: Not on file  Social Needs  . Financial resource strain: Not on file  . Food insecurity - worry: Not on file  . Food insecurity - inability: Not on file  . Transportation needs - medical: Not on file  . Transportation needs - non-medical: Not on file  Occupational History  . Occupation: retired    Fish farm manager: [employer]  Tobacco Use  . Smoking status: Former Smoker    Packs/day: 2.00    Years: 14.00    Pack years: 28.00    Types: Cigarettes    Last attempt to quit: 11/11/1972    Years since quitting: 45.0  . Smokeless tobacco: Never Used  Substance and Sexual Activity  . Alcohol use: No    Comment: quit in 1984  . Drug use: No  . Sexual activity: Not Currently  Other Topics Concern  .  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   Patient has one child and his wife has one child.   Patient is retired.   Patient has a high school education.   Patient is right-handed.   Patient drinks very little caffeine.    Family History:   Family History  Problem Relation Age of Onset  . Diabetes Mother   . Heart attack Mother   . Hypertension Mother   . Heart attack Father   . Heart failure Father   . Hypertension Father   . Diabetes Father   . Diabetes Sister   . Diabetes Brother   . Diabetes Other   . Diabetes Daughter        TYPE ll  . Heart Problems Daughter   . Hypertension Sister   . Hypertension Brother   . Stroke Brother      ROS:  Please see the history of present illness.  ROS  All other ROS reviewed and negative.     Physical Exam/Data:   Vitals:   12/04/17 0845 12/04/17 0850 12/04/17 0916 12/04/17 0931  BP: (!) 165/56 (!) 164/60 (!) 151/42 (!) 147/41  Pulse: 70 96 60   Resp: 14 12    Temp:      TempSrc:      SpO2: 94% 95%    Weight:      Height:        Intake/Output Summary (Last 24 hours) at 12/04/2017  0935 Last data filed at 12/04/2017 0654 Gross per 24 hour  Intake 640 ml  Output 850 ml  Net -210 ml   Filed Weights   12/02/17 0621 12/03/17 0500 12/04/17 0451  Weight: 251 lb (113.9 kg) 261 lb 14.5 oz (118.8 kg) 261 lb 14.5 oz (118.8 kg)   Body mass index is 36.53 kg/m.  General:  Well nourished, well developed, in no acute distress HEENT: normal Lymph: no adenopathy Neck: no JVD Endocrine:  No thryomegaly Vascular: No carotid bruits; Cardiac:  RRR; 1/6 SM, no gallops or rubs Lungs:  CTA b/l (antero/lat auscultation only), no wheezing, rhonchi or rales  Abd: soft, nontender Ext: no edema Musculoskeletal:  No deformities Skin: warm and dry  Neuro:  CNs 2-12 intact, no focal abnormalities noted Psych:  Normal affect   EKG:  The EKG was personally reviewed and demonstrates:   Asynchrons V paced 11/28/17: SR 1st degree AVBlock, PR 270ms, QRS 136ms Telemetry:  Telemetry was personally reviewed and demonstrates:   SR/V paced 60bpm  Relevant CV Studies:  12/03/17: TTE Study Conclusions - (current admission, 12/02/2017). Edwards Sapien 3 THV 29 mm. - Left ventricle: The cavity size was normal. Systolic function was   normal. The estimated ejection fraction was in the range of 55%   to 60%. There is akinesis of the inferoseptal myocardium. Doppler   parameters are consistent with abnormal left ventricular   relaxation (grade 1 diastolic dysfunction). Doppler parameters   are consistent with high ventricular filling pressure. - Aortic valve: A bioprosthesis was present. Peak velocity (S): 129   cm/s. Peak gradient (S): 7 mm Hg. Valve area (VTI): 2.42 cm^2.   Valve area (Vmax): 2.6 cm^2. Valve area (Vmean): 2.43 cm^2. - Mitral valve: Calcified annulus. There was mild regurgitation.  10/08/17: R/LHC  Prox RCA lesion is 100% stenosed.  Ost LAD to Prox LAD lesion is 60% stenosed.  There is severe aortic valve stenosis.  Hemodynamic findings consistent with mild  pulmonary  hypertension.  Laboratory Data:  Chemistry Recent Labs  Lab 12/02/17 0620 12/02/17 0802 12/02/17 0855 12/02/17 1010 12/03/17 0349  NA 137 139 138 138 138  K 3.9 4.0 4.3 4.4 3.9  CL 103 102 102  --  103  CO2 21*  --   --   --  25  GLUCOSE 183* 197* 236* 239* 193*  BUN 25* 24* 24*  --  22*  CREATININE 1.98* 1.60* 1.70*  --  1.78*  CALCIUM 9.5  --   --   --  9.0  GFRNONAA 32*  --   --   --  36*  GFRAA 37*  --   --   --  42*  ANIONGAP 13  --   --   --  10    Recent Labs  Lab 12/02/17 0620  PROT 6.6  ALBUMIN 3.8  AST 28  ALT 23  ALKPHOS 34*  BILITOT 0.6   Hematology Recent Labs  Lab 11/28/17 1129  12/02/17 1010 12/02/17 1015 12/03/17 0349  WBC 6.8  --   --  9.1 11.6*  RBC 4.01*  --   --  3.31* 3.74*  HGB 13.1   < > 9.9* 11.0* 12.2*  HCT 38.6*   < > 29.0* 31.8* 35.4*  MCV 96.3  --   --  96.1 94.7  MCH 32.7  --   --  33.2 32.6  MCHC 33.9  --   --  34.6 34.5  RDW 13.4  --   --  13.1 13.0  PLT 211  --   --  143* 148*   < > = values in this interval not displayed.   Cardiac EnzymesNo results for input(s): TROPONINI in the last 168 hours. No results for input(s): TROPIPOC in the last 168 hours.  BNP Recent Labs  Lab 11/28/17 1129  BNP 47.9    DDimer No results for input(s): DDIMER in the last 168 hours.  Radiology/Studies:  Dg Chest Port 1 View Result Date: 12/02/2017 CLINICAL DATA:  Status post TAVR EXAM: PORTABLE CHEST 1 VIEW COMPARISON:  11/28/2017 FINDINGS: 1019 hours. The cardio pericardial silhouette is enlarged. Aortic valve prosthesis noted. Right IJ central line tip is positioned in or just proximal to the innominate vein confluence. There is some patchy opacity in the right suprahilar lung and left base potentially relating to atelectasis or asymmetric edema. No evidence for pneumothorax or substantial pleural effusion. The visualized bony structures of the thorax are intact. Telemetry leads overlie the chest. IMPRESSION: 1. Cardiomegaly with patchy  opacity in the right suprahilar lung and left base. This may be atelectasis or asymmetric edema. Electronically Signed   By: Misty Stanley M.D.   On: 12/02/2017 10:42   Dg Abd Portable 1v Result Date: 12/03/2017 CLINICAL DATA:  Multiple vomiting episodes today. EXAM: PORTABLE ABDOMEN - 1 VIEW COMPARISON:  Abdominal CT angiogram of October 31, 2017 FINDINGS: There new marked distention of the stomach with fluid and gas. There is no evidence of a small bowel obstruction. IMPRESSION: Distended stomach which may reflect gastroparesis or gastric outlet obstruction. Nasogastric suction would be useful. Electronically Signed   By: David  Martinique M.D.   On: 12/03/2017 15:54    Assessment and Plan:   1. CHB, asystole     POD #2 from TAVR    S/p emergent temp wire this AM    Chronically on amiodarone, no BB in days, Quadarius Henton need PPM  Dr. Curt Bears has seen and examined the patient, discussed PPM  procedure, risks, benefits, wife at bedside the patient is agreeable to proceed  Planned for this afternoon  2. PAFib     CHA2DS2Vasc is 4, outpatient on Pradaxa (held here given procedures)             For questions or updates, please contact [place] Please consult [email or web address] for contact info under Cardiology/STEMI.   Signed, Baldwin Jamaica, [name]  12/04/2017 9:35 AM;  I have seen and examined this patient with Tommye Standard.  Agree with above, note added to reflect my findings.  On exam, RRR, no murmurs, lungs clear.  POD 2 from TAVR. This AM had asystolic event and subsequently HR in the 20s and temporary wire placed. At this point, Noland Pizano need pacemaker implant. Risks and benefits discussed. Risks include but not limited to bleeding, infection, tamponade, pneumothorax (3-4%), and MI, death, stroke (<1%). The patient understands the risks and has agreed to the procedure.   Kingston Shawgo M. Emylee Decelle MD 12/04/2017 11:11 AM

## 2017-12-04 NOTE — Code Documentation (Signed)
Per staff patient suddenly became syncopal,  Code blue called.  Upon my arrival Dr Rayann Heman at bedside, patient alert but diaphoretic.  HR 20s.  See code documentation.  1 Atropine given IV.  Dopamine gtt started at 24mcg and quickly increased to 85mcg  Minimal response with HR.  Transported to Cath Lab via bed with zoll monitor, and O2.  Dr Rayann Heman assisted with transport.  Hand off with cath lab staff:  Plan temp pacer.

## 2017-12-04 NOTE — Progress Notes (Signed)
Site area: Left groin a 7 french venous sheath ws removed  Site Prior to Removal:  Level 0  Pressure Applied For 15 MINUTES    Bedrest Beginning at  1600pm  Manual:   Yes.    Patient Status During Pull:  stable  Post Pull Groin Site:  Level 0  Post Pull Instructions Given:  Yes.    Post Pull Pulses Present:  Yes.    Dressing Applied:  Yes.    Comments:  VS remain stable

## 2017-12-04 NOTE — Interval H&P Note (Signed)
History and Physical Interval Note:  12/04/2017 8:30 AM  Lani R Messman  has presented today for surgery, with the diagnosis of Post-TAVR bradycardia with intermittent Asystole.   The various methods of treatment have been discussed with the patient and family. After consideration of risks, benefits and other options for treatment, the patient has consented to  Procedure(s): TEMPORARY PACEMAKER (N/A) as a surgical intervention .  The patient's history has been reviewed, patient examined, no change in status, stable for surgery.  I have reviewed the patient's chart and labs.  Questions were answered to the patient's satisfaction.     Glenetta Hew

## 2017-12-04 NOTE — Progress Notes (Signed)
   12/04/17 0828  Clinical Encounter Type  Visited With Patient;Health care provider  Visit Type Code  Referral From Nurse  Consult/Referral To Chaplain   Responded to a Code Blue for this patient.  When I arrived medical team in place and patient was alert.  He asked me to call his wife.  I informed his wife (with clearance from medical staff) that he was going to go to the Cath Lab and that he asked me to call her.  Instructed the wife on where to go and check in when she arrives.  Will follow as needed. Chaplain Katherene Ponto

## 2017-12-04 NOTE — Progress Notes (Signed)
Transferred from 2H,V/S taken and recorded,telemetry monitor applied and central telemetry notified.Patient oriented in the room with call bell within reach.

## 2017-12-04 NOTE — Progress Notes (Signed)
Overhead code for patient with asystole. 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  He was awake/ appropriately mentating.  Atropine did not help.  He was started on dopamine and transfer to cath lab urgently for temp wire placement by Dr Ellyn Hack (see his report).  I did discuss risks of temp pacing wire with patient at bedside and he verbally consented to the procedure.   I have spoken with Dr Angelena Form and Ms Grandville Silos.  Will plan to have Dr Curt Bears see later today.  I would anticipate that permanent pacemaker implantation will be required at this point.  Thompson Grayer MD, [facility] 12/04/2017 11:11 AM

## 2017-12-05 ENCOUNTER — Encounter: Payer: Self-pay | Admitting: Cardiology

## 2017-12-05 ENCOUNTER — Encounter (HOSPITAL_COMMUNITY): Payer: Self-pay | Admitting: Cardiology

## 2017-12-05 ENCOUNTER — Inpatient Hospital Stay (HOSPITAL_COMMUNITY): Payer: Medicare Other

## 2017-12-05 DIAGNOSIS — I480 Paroxysmal atrial fibrillation: Secondary | ICD-10-CM

## 2017-12-05 DIAGNOSIS — Z950 Presence of cardiac pacemaker: Secondary | ICD-10-CM | POA: Diagnosis present

## 2017-12-05 LAB — BASIC METABOLIC PANEL
Anion gap: 15 (ref 5–15)
BUN: 38 mg/dL — ABNORMAL HIGH (ref 6–20)
CO2: 22 mmol/L (ref 22–32)
Calcium: 8.4 mg/dL — ABNORMAL LOW (ref 8.9–10.3)
Chloride: 101 mmol/L (ref 101–111)
Creatinine, Ser: 1.7 mg/dL — ABNORMAL HIGH (ref 0.61–1.24)
GFR calc Af Amer: 44 mL/min — ABNORMAL LOW (ref >60)
GFR calc non Af Amer: 38 mL/min — ABNORMAL LOW (ref >60)
Glucose, Bld: 192 mg/dL — ABNORMAL HIGH (ref 65–99)
Potassium: 3.9 mmol/L (ref 3.5–5.1)
Sodium: 138 mmol/L (ref 135–145)

## 2017-12-05 LAB — CBC
HCT: 35.9 % — ABNORMAL LOW (ref 39.0–52.0)
Hemoglobin: 11.9 g/dL — ABNORMAL LOW (ref 13.0–17.0)
MCH: 32.3 pg (ref 26.0–34.0)
MCHC: 33.1 g/dL (ref 30.0–36.0)
MCV: 97.6 fL (ref 78.0–100.0)
Platelets: 95 10*3/uL — ABNORMAL LOW (ref 150–400)
RBC: 3.68 MIL/uL — ABNORMAL LOW (ref 4.22–5.81)
RDW: 13.3 % (ref 11.5–15.5)
WBC: 12.3 10*3/uL — ABNORMAL HIGH (ref 4.0–10.5)

## 2017-12-05 LAB — GLUCOSE, CAPILLARY: Glucose-Capillary: 193 mg/dL — ABNORMAL HIGH (ref 65–99)

## 2017-12-05 MED FILL — Lidocaine HCl Local Inj 1%: INTRAMUSCULAR | Qty: 20 | Status: AC

## 2017-12-05 NOTE — Care Management Important Message (Signed)
Important Message  Patient Details  Name: Steven Maldonado MRN: [id number] Date of Birth: 1941/01/09   Medicare Important Message Given:  Yes    Carles Collet, RN 12/05/2017, 10:58 AM

## 2017-12-05 NOTE — Discharge Instructions (Signed)
Supplemental Discharge Instructions for  Pacemaker/Defibrillator Patients  Activity No heavy lifting or vigorous activity with your left/right arm for 6 to 8 weeks.  Do not raise your left/right arm above your head for one week.  Gradually raise your affected arm as drawn below.              12/08/17                    12/09/17                    12/10/17                   12/11/17 __  NO DRIVING for 1 week post pacemaker implant, please also refer to post valve instructions, follow whichever restriction is longest.  WOUND CARE - Keep the wound area clean and dry.  Do not get this area wet, no showers until cleared to at your wound check visit . - The tape/steri-strips on your wound will fall off; do not pull them off.  No bandage is needed on the site.  DO  NOT apply any creams, oils, or ointments to the wound area. - If you notice any drainage or discharge from the wound, any swelling or bruising at the site, or you develop a fever > 101? F after you are discharged home, call the office at once.  Special Instructions - You are still able to use cellular telephones; use the ear opposite the side where you have your pacemaker/defibrillator.  Avoid carrying your cellular phone near your device. - When traveling through airports, show security personnel your identification card to avoid being screened in the metal detectors.  Ask the security personnel to use the hand wand. - Avoid arc welding equipment, MRI testing (magnetic resonance imaging), TENS units (transcutaneous nerve stimulators).  Call the office for questions about other devices. - Avoid electrical appliances that are in poor condition or are not properly grounded. - Microwave ovens are safe to be near or to operate.  Additional information for defibrillator patients should your device go off: - If your device goes off ONCE and you feel fine afterward, notify the device clinic nurses. - If your device goes off ONCE and you do  not feel well afterward, call 911. - If your device goes off TWICE, call 911. - If your device goes off THREE times in one day, call 911.  DO NOT DRIVE YOURSELF OR A FAMILY MEMBER WITH A DEFIBRILLATOR TO THE HOSPITAL--CALL 911.   ACTIVITY AND EXERCISE  Daily activity and exercise are an important part of your recovery. People recover at different rates depending on their general health and type of valve procedure.  Most people recovering from TAVR feel better relatively quickly   No lifting, pushing, pulling more than 10 pounds (examples to avoid: groceries, vacuuming, gardening, golfing):             - For one week with a procedure through the groin.             - For six weeks for procedures through the chest wall.             - For three months for procedures through the breast-bone. NOTE: You will typically see one of our providers 7-10 days after your procedure to discuss [place] the above activities.    DRIVING  Do not drive for until you are seen for follow up and  cleared by a provider.  If you have been told by your doctor in the past that you may not drive, you must talk with him/her before you begin driving again.   DRESSING  Groin site: you may leave the clear dressing over the site for up to one week or until it falls off.   HYGIENE  If you had a femoral (leg) procedure, you may take a shower when you return home. After the shower, pat the site dry. Do NOT use powder, oils or lotions in your groin area until the site has completely healed.  If you had a chest procedure, you may shower when you return home unless specifically instructed not to by your discharging practitioner.             - DO NOT scrub incision; pat dry with a towel             - DO NOT apply any lotions, oils, powders to the incision             - No tub baths / swimming for at least 2 weeks.  If you notice any fevers, chills, increased pain, swelling, bleeding or pus, please  contact your doctor.  ADDITIONAL INFORMATION  If you are going to have an upcoming dental procedure, please contact our office as you will require antibiotics ahead of time to prevent infection on your heart valve.   Information on my medicine - Pradaxa (dabigatran)  This medication education was reviewed with me or my healthcare representative as part of my discharge preparation.  The pharmacist that spoke with me during my hospital stay was:  Einar Grad, [facility]  Why was Pradaxa prescribed for you? Pradaxa was prescribed for you to reduce the risk of forming blood clots that cause a stroke if you have a medical condition called atrial fibrillation (a type of irregular heartbeat).    What do you Need to know about PradAXa? Take your Pradaxa TWICE DAILY - one capsule in the morning and one tablet in the evening with or without food.  It would be best to take the doses about the same time each day.  The capsules should not be broken, chewed or opened - they must be swallowed whole.  Do not store Pradaxa in other medication containers - once the bottle is opened the Pradaxa should be used within FOUR months; throw away any capsules that havent been by that time.  Take Pradaxa exactly as prescribed by your doctor.  DO NOT stop taking Pradaxa without talking to the doctor who prescribed the medication.  Stopping without other stroke prevention medication to take the place of Pradaxa may increase your risk of developing a clot that causes a stroke.  Refill your prescription before you run out.  After discharge, you should have regular check-up appointments with your healthcare provider that is prescribing your Pradaxa.  In the future your dose may need to be changed if your kidney function or weight changes by a significant amount.  What do you do if you miss a dose? If you miss a dose, take it as soon as you remember on the same day.  If your next dose is less than 6 hours away,  skip the missed dose.  Do not take two doses of PRADAXA at the same time.  Important Safety Information A possible side effect of Pradaxa is bleeding. You should call your healthcare provider right away if you experience any of the following: ? Bleeding  from an injury or your nose that does not stop. ? Unusual colored urine (red or dark brown) or unusual colored stools (red or black). ? Unusual bruising for unknown reasons. ? A serious fall or if you hit your head (even if there is no bleeding).  Some medicines may interact with Pradaxa and might increase your risk of bleeding or clotting while on Pradaxa. To help avoid this, consult your healthcare provider or pharmacist prior to using any new prescription or non-prescription medications, including herbals, vitamins, non-steroidal anti-inflammatory drugs (NSAIDs) and supplements.  This website has more information on Pradaxa (dabigatran): [email or web address]://[email or web address].com

## 2017-12-05 NOTE — Discharge Summary (Signed)
[facility]VE TEAM   Discharge Summary    Patient ID: Steven Maldonado,  MRN: [id number], DOB/AGE: [birth year] 75 y.o.  Admit date: 12/02/2017 Discharge date: 12/05/2017  Primary Care Provider: Dena Billet B Primary Cardiologist: Dr. Angelena Form / Dr. Angelena Form & Dr. Roxy Manns (TAVR)   Discharge Diagnoses    Principal Problem:   S/P TAVR (transcatheter aortic valve replacement) Active Problems:   Hyperlipidemia   CAD (coronary artery disease)   Diabetes mellitus type 2, uncomplicated (HCC)   Obesity (BMI 30-39.9)   OSA on CPAP   Chronic kidney disease   Diabetes (HCC)   PAD (peripheral artery disease) (HCC)   Essential hypertension   Severe aortic stenosis   Symptomatic bradycardia   Asystole (HCC)   PAF (paroxysmal atrial fibrillation) (HCC)   Pacemaker   Allergies No Known Allergies   History of Present Illness    Steven Maldonado is a 77 y.o. male with a history of CAD, PAF on amio and pradaxa, HTN, chronic diastolic CHF, HTN, HLD, IDDM, CKD stage III, PVD, OSA on CPAP, morbid obesity, and severe AS who presented to [facility] on 12/02/17 for planned TAVR.   The patient's cardiac history dates back to 2012 when he suffered an acute myocardial infarction.  At the time he was treated at [facility] where catheterization reportedly revealed occluded right coronary artery with collaterals.  Medical therapy was recommended.  The patient developed paroxysmal atrial fibrillation and has been followed intermittently ever since on long-term anticoagulation using Pradaxa.  He was noted to have a heart murmur on physical exam and echocardiograms revealed aortic stenosis that has gradually progressed in severity.  For the last several years the patient has been followed by Dr. Angelena Form for management of aortic stenosis, coronary artery disease, and atrial fibrillation.  He has been followed by Dr. Fletcher Anon in the [facility] clinic.  Last spring he was  noted to be back in atrial fibrillation and he was referred to the atrial fibrillation clinic.  He was loaded with amiodarone he has been maintaining sinus rhythm since he underwent repeat cardioversion performed September 2018.  He was seen in follow-up recently by Dr. Angelena Form where he reported slow progression of symptoms of exertional shortness of breath and fatigue despite the fact that he was maintaining sinus rhythm.   Most recent follow-up transthoracic echocardiogram performed June 16, 2017 suggested significant progression in the severity of aortic stenosis.  The patient was in atrial fibrillation at the time of the echocardiogram, and peak velocity across the aortic valve range between different length cardiac cycles.  Peak velocity was reported 3.3 m/s corresponding to mean transvalvular gradient estimated 26 mmHg, but the DVI was quite low at 0.19.  The aortic valve area calculated only 0.8 cm.  Left ventricular function remained stable with ejection fraction estimated 50-55%.  The patient subsequently underwent left and right heart catheterization on October 08, 2017 by Dr. Angelena Form.  Catheterization revealed the presence of severe aortic stenosis with peak to peak and mean transvalvular gradients measured 44 and 41.8 mmHg, respectively, corresponding to aortic valve area calculated 0.79 cm.  There was moderate 60% proximal stenosis of the left anterior descending coronary artery that was felt not to be flow limiting.  There was 100% chronic occlusion of the right coronary artery.  There was otherwise nonobstructive coronary artery disease.    He was evaluated by the multidisciplinary valve team and felt to be a suitable candidate for  TAVR, which was set up for 12/02/17.   Hospital Course     Consultants: none  Severe AS: s/p successful TAVR with a 29 mm Edwards Sapien THV via the R TF approach. Post operative echo shows EF 55-60% with normally functioning TAVR valve, no PVL and mean  gradient 51mm Hg. Groin sites stable. Continue ASA and home pradaxa. He developed hemodynamically significant bradycardia s/p TAVR and required PPM placement.  Symptomatic Bradycardia s/p PPM: he had bradycardia post operatively and temp wire was left in for 24 hours. His HR seemed to recover and temp wire was pulled out. Then he had an episode of asystole with syncope follow by junctional bradycardia on 12/04/17 AM. He was treated with atropine and DA and emergently taken to the cath lab for temp wire placement. Dr. Curt Bears implanted a Medtronic Azure XT DR MRI SureScan dual-chamber pacemaker (serial number [id number]) on 12/04/17. Device functioning normally this AM and CXR with no PTX. All follow up has been arranged.    HTN: BP currently well controlled.   PAF: he presented to his surgery in atrial fib with slow VR and then reverted to sinus bradycardia. Home Lopressor was held. He was continued on home amiodarone 200mg  daily. Now he is s/p PPM placement and back in atrial flutter. Will continue home amio, resume home BB and Pradaxa. He will continue to follow with Dr. Curt Bears.  N/V: this has resolved.   CKD: creat stable at 1.70. Continue to monitor  DMT2: resume home reigmen  OSA: continue CPAP  CAD: continue medical therapy.    The patient has had an uncomplicated hospital course and is recovering well. The femoral catheter sites are stable. He has been seen by Dr. Angelena Form today and deemed ready for discharge home. All follow-up appointments have been scheduled. Discharge medications are listed below.  _____________  Discharge Vitals Blood pressure (!) 137/59, pulse (!) 59, temperature 98.7 F (37.1 C), temperature source Oral, resp. rate 16, height 5\' 11"  (1.803 m), weight 255 lb 11.7 oz (116 kg), SpO2 97 %.  Filed Weights   12/03/17 0500 12/04/17 0451 12/05/17 0440  Weight: 261 lb 14.5 oz (118.8 kg) 261 lb 14.5 oz (118.8 kg) 255 lb 11.7 oz (116 kg)    GEN: Well  nourished, well developed, in no acute distress. Obese, disoriented  HEENT: Grossly normal.  Neck: Supple, no JVD, carotid bruits, or masses. Cardiac: RRR,, no murmurs, rubs, or gallops. No clubbing, cyanosis, edema.  Radials/DP/PT 2+ and equal bilaterally.  Respiratory:  Respirations regular and unlabored, clear to auscultation bilaterally. GI: Soft, nontender, nondistended, BS + x 4. MS: no deformity or atrophy. Skin: warm and dry, no rash. Groin sites stable. Pacer site looks good.  Neuro:  Strength and sensation are intact. Psych: AAOx3.  Normal affect.   Labs & Radiologic Studies     CBC Recent Labs    12/03/17 0349  WBC 11.6*  HGB 12.2*  HCT 35.4*  MCV 94.7  PLT 606*   Basic Metabolic Panel Recent Labs    12/03/17 0349 12/05/17 0320  NA 138 138  K 3.9 3.9  CL 103 101  CO2 25 22  GLUCOSE 193* 192*  BUN 22* 38*  CREATININE 1.78* 1.70*  CALCIUM 9.0 8.4*  MG 1.9  --    Liver Function Tests No results for input(s): AST, ALT, ALKPHOS, BILITOT, PROT, ALBUMIN in the last 72 hours. No results for input(s): LIPASE, AMYLASE in the last 72 hours. Cardiac Enzymes No results for input(s):  CKTOTAL, CKMB, CKMBINDEX, TROPONINI in the last 72 hours. BNP Invalid input(s): POCBNP D-Dimer No results for input(s): DDIMER in the last 72 hours. Hemoglobin A1C No results for input(s): HGBA1C in the last 72 hours. Fasting Lipid Panel No results for input(s): CHOL, HDL, LDLCALC, TRIG, CHOLHDL, LDLDIRECT in the last 72 hours. Thyroid Function Tests No results for input(s): TSH, T4TOTAL, T3FREE, THYROIDAB in the last 72 hours.  Invalid input(s): FREET3  Dg Chest 2 View  Result Date: 12/05/2017 CLINICAL DATA:  Pacemaker placement EXAM: CHEST  2 VIEW COMPARISON:  12/04/2017 FINDINGS: Interval placement of a LEFT subclavian sequential pacemaker with leads projecting at RIGHT atrium and RIGHT ventricle. Mild enlargement of cardiac silhouette post TAVR. Lungs clear. No pulmonary  infiltrate, pleural effusion or pneumothorax. Bones unremarkable. IMPRESSION: New pacemaker without pneumothorax. Mild enlargement of cardiac silhouette post TAVR. Electronically Signed   By: Lavonia Dana M.D.   On: 12/05/2017 08:42   Dg Chest 2 View  Result Date: 11/28/2017 CLINICAL DATA:  Severe aortic stenosis EXAM: CHEST  2 VIEW COMPARISON:  06/20/2017 FINDINGS: Heart size mildly enlarged without heart failure. Atherosclerotic aortic arch. Lungs are clear without infiltrate effusion or mass lesion. IMPRESSION: No active cardiopulmonary disease. Electronically Signed   By: Franchot Gallo M.D.   On: 11/28/2017 14:15   Dg Chest Port 1 View  Result Date: 12/04/2017 CLINICAL DATA:  Temporary pacer cleared EXAM: PORTABLE CHEST 1 VIEW COMPARISON:  Two days ago FINDINGS: A temporary pacer lead from below continues to project over the right ventricle. Status post transcatheter aortic valve replacement. Chronic cardiomegaly. Pulmonary edema seen previously is resolved. No air bronchogram, effusion, or pneumothorax. Artifact from EKG leads and defibrillator pads. IMPRESSION: 1. Temporary pacer wire projects over the right ventricle. 2. Pulmonary edema seen 2 days ago is resolved. 3. Stable cardiomegaly. Electronically Signed   By: Monte Fantasia M.D.   On: 12/04/2017 12:35   Dg Chest Port 1 View  Result Date: 12/02/2017 CLINICAL DATA:  Status post TAVR EXAM: PORTABLE CHEST 1 VIEW COMPARISON:  11/28/2017 FINDINGS: 1019 hours. The cardio pericardial silhouette is enlarged. Aortic valve prosthesis noted. Right IJ central line tip is positioned in or just proximal to the innominate vein confluence. There is some patchy opacity in the right suprahilar lung and left base potentially relating to atelectasis or asymmetric edema. No evidence for pneumothorax or substantial pleural effusion. The visualized bony structures of the thorax are intact. Telemetry leads overlie the chest. IMPRESSION: 1. Cardiomegaly with patchy  opacity in the right suprahilar lung and left base. This may be atelectasis or asymmetric edema. Electronically Signed   By: Misty Stanley M.D.   On: 12/02/2017 10:42   Dg Abd Portable 1v  Result Date: 12/03/2017 CLINICAL DATA:  Multiple vomiting episodes today. EXAM: PORTABLE ABDOMEN - 1 VIEW COMPARISON:  Abdominal CT angiogram of October 31, 2017 FINDINGS: There new marked distention of the stomach with fluid and gas. There is no evidence of a small bowel obstruction. IMPRESSION: Distended stomach which may reflect gastroparesis or gastric outlet obstruction. Nasogastric suction would be useful. Electronically Signed   By: David  Martinique M.D.   On: 12/03/2017 15:54     Diagnostic Studies/Procedures    TAVR OPERATIVE NOTE   Date of Procedure:12/02/2017 Procedure:   Transcatheter Aortic Valve Replacement - PercutaneousRightTransfemoral Approach Edwards Sapien 3 THV (size 75mm, model # 9600TFX, serial # [id number])  Co-Surgeons:Gavin Telford Angelena Form, MD andClarence H. Roxy Manns, MD   Pre-operative Echo Findings: ? Severe aortic stenosis ? Normalleft ventricular  systolic function  Post-operative Echo Findings: ? Noparavalvular leak ? Normalleft ventricular systolic function   ____________  Post operative echo 12/03/17  Study Conclusions - (current admission, 12/02/2017). Edwards Sapien 3 THV 29 mm. - Left ventricle: The cavity size was normal. Systolic function was normal. The estimated ejection fraction was in the range of 55% to 60%. There is akinesis of the inferoseptal myocardium. Doppler parameters are consistent with abnormal left ventricular relaxation (grade 1 diastolic dysfunction). Doppler parameters are consistent with high ventricular filling pressure. - Aortic valve: A bioprosthesis was present. Peak velocity (S): 129 cm/s. Peak gradient (S): 7 mm Hg. Valve area (VTI): 2.42 cm^2. Valve  area (Vmax): 2.6 cm^2. Valve area (Vmean): 2.43 cm^2. - Mitral valve: Calcified annulus. There was mild regurgitation. Complications: Temporary Pacemaker still in, patient unable to turn onto his left side.   Disposition   Pt is being discharged home today in good condition.  Follow-up Plans & Appointments    Follow-up Information    [facility] Follow up on 12/17/2017.   Specialty:  Cardiology Why:  3:30PM, wound check visit Contact information: [address] [place]       Constance Haw, MD Follow up on 03/04/2018.   Specialty:  Cardiology Why:  10:00AM Contact information: [address] [place] [address] [phone]        Eileen Stanford, [name]. Go on 12/11/2017.   Specialties:  Cardiology, Radiology Why:  @ 2:30pm Contact information: [place] [address] [phone]          Discharge Instructions    Amb Referral to Cardiac Rehabilitation   Complete by:  As directed    Diagnosis:  Valve Replacement   Valve:  Aortic Comment - TAVR   Diet - low sodium heart healthy   Complete by:  As directed    Increase activity slowly   Complete by:  As directed       Discharge Medications     Medication List    STOP taking these medications   cefdinir 300 MG capsule Commonly known as:  OMNICEF     TAKE these medications   amiodarone 200 MG tablet Commonly known as:  PACERONE Take 1 tablet (200 mg total) by mouth daily.   aspirin EC 81 MG tablet Take 81 mg by mouth daily.   atorvastatin 80 MG tablet Commonly known as:  LIPITOR TAKE 1 TABLET EVERY DAY AT BEDTIME   BASAGLAR KWIKPEN 100 UNIT/ML Sopn Inject 0.65 mLs (65 Units total) into the skin at bedtime.   BD PEN NEEDLE NANO U/F 32G X 4 MM Misc Generic drug:  Insulin Pen Needle USE 4 (FOUR) TIMES DAILY.   Choline Fenofibrate 135 MG capsule TAKE 1 CAPSULE DAILY     CINNAMON PO Take 1 tablet by mouth 2 (two) times daily.   diphenhydramine-acetaminophen 25-500 MG Tabs tablet Commonly known as:  TYLENOL PM Take 1 tablet by mouth at bedtime as needed (sleep).   docusate sodium 100 MG capsule Commonly known as:  COLACE Take 100 mg daily by mouth.   FREESTYLE LITE Devi 1 each by Does not apply route 2 (two) times daily.   furosemide 20 MG tablet Commonly known as:  LASIX Take 1 tablet (20 mg total) by mouth daily.   glipiZIDE 10 MG 24 hr tablet Commonly known as:  GLUCOTROL XL TAKE 1 TABLET (10 MG TOTAL) BY MOUTH DAILY  WITH BREAKFAST.   glucose blood test strip Commonly known as:  FREESTYLE LITE 1 each by Other route 4 (four) times daily -  before meals and at bedtime.   guaifenesin 100 MG/5ML syrup Commonly known as:  ROBITUSSIN Take 200 mg by mouth 3 (three) times daily as needed for cough.   insulin lispro 100 UNIT/ML KiwkPen Commonly known as:  HUMALOG Inject 0.05 mLs (5 Units total) into the skin 3 (three) times daily.   levothyroxine 25 MCG tablet Commonly known as:  SYNTHROID Take 1 tablet by mouth daily for 3 weeks. Then increase to 2 tablets daily   magnesium oxide 400 MG tablet Commonly known as:  MAG-OX TAKE 1 TABLET (400 MG TOTAL) BY MOUTH DAILY.   metoprolol tartrate 50 MG tablet Commonly known as:  LOPRESSOR Take one and one-half tablet by mouth twice a day What changed:    how much to take  how to take this  when to take this  additional instructions   MYRBETRIQ 25 MG Tb24 tablet Generic drug:  mirabegron ER TAKE 1 TABLET BY MOUTH EVERY DAY   PRADAXA 150 MG Caps capsule Generic drug:  dabigatran TAKE 1 CAPSULE EVERY 12    HOURS   TOVIAZ 4 MG Tb24 tablet Generic drug:  fesoterodine Take 4 mg by mouth daily.         Outstanding Labs/Studies  None  Duration of Discharge Encounter   Greater than 30 minutes including physician time.  Signed, Angelena Form [name] 12/05/2017, 10:16 AM   I  have personally seen and examined this patient. I agree with the assessment and plan as outlined above. He is doing well this am. BP is stable. No issues with pacemaker. Will d/c home today on Pradaxa.  [name] 12/05/2017 10:32 AM

## 2017-12-05 NOTE — Care Management Note (Signed)
Case Management Note Previous CM note completed by Zenon Mayo, RN--12/02/2017, 4:24 PM   Patient Details  Name: Steven Maldonado MRN: [id number] Date of Birth: 1941-09-12  Subjective/Objective:   From home with spouse, s/p TAVR.                  Action/Plan: NCM will follow for dc needs.   Expected Discharge Date:  12/05/17               Expected Discharge Plan:  Home/Self Care  In-House Referral:  NA  Discharge planning Services  CM Consult  Post Acute Care Choice:  NA Choice offered to:  NA  DME Arranged:    DME Agency:     HH Arranged:    HH Agency:     Status of Service:  Completed, signed off  If discussed at [employer] of Stay Meetings, dates discussed:    Discharge Disposition: home/self care   Additional Comments:  12/05/17- [place] RN, CM- pt for d/c home today, no CM needs noted for transition home.   Dahlia Client [place], RN 12/05/2017, 12:11 PM [phone]

## 2017-12-05 NOTE — Progress Notes (Signed)
12/05/2017 11:36 AM Discharge AVS meds taken today and those due this evening reviewed.  Follow-up appointments and when to call md reviewed.  D/C IV and TELE.  Questions and concerns addressed.   D/C home per orders. [place]

## 2017-12-05 NOTE — Progress Notes (Signed)
CARDIAC REHAB PHASE I   PRE:  Rate/Rhythm: 60 pacing    BP: sitting 139/55    SaO2: 97 RA  MODE:  Ambulation: 400 ft   POST:  Rate/Rhythm: 88 pacing    BP: sitting 125/54     SaO2: 97 RA  Pt up in recliner, eager to walk. He used his rollator from home (doesn't normally use, just has it). Assist x2 for supervision. Fairly steady although his stance is wide and he was at risk for hitting rollator wheels at times. He also pushed it quickly. Practiced last 19 ft without AD. Slight unsteadiness but was able to stand more upright. We discussed the pros and cons of each at home. Wife present. Discussed walking daily, restrictions, and CRPII. Will send referral to [place]. Pt eager to do program. Gave DM diet sheet as well. He understands his pacer restrictions. [phone]   [place], ACSM 12/05/2017 10:08 AM

## 2017-12-05 NOTE — Progress Notes (Signed)
Progress Note  Patient Name: Steven Maldonado Date of Encounter: 12/05/2017  Primary Cardiologist: No primary care provider on file.   Subjective   OOB to chair, feels "great" to be OOB, no CP, palpitations, denies any SOB  Inpatient Medications    Scheduled Meds: . acetaminophen  1,000 mg Oral Q6H   Or  . acetaminophen (TYLENOL) oral liquid 160 mg/5 mL  1,000 mg Per Tube Q6H  . amiodarone  200 mg Oral Daily  . aspirin EC  81 mg Oral Daily   Or  . aspirin  81 mg Per Tube Daily  . atorvastatin  80 mg Oral q1800  . docusate sodium  100 mg Oral Daily  . fenofibrate  54 mg Oral Daily  . fesoterodine  4 mg Oral Daily  . insulin aspart  0-20 Units Subcutaneous TID WC  . insulin aspart  6 Units Subcutaneous TID WC  . levothyroxine  50 mcg Oral QAC breakfast  . magnesium oxide  400 mg Oral Daily  . metoCLOPramide (REGLAN) injection  5 mg Intravenous Q6H  . mirabegron ER  25 mg Oral Daily  . pantoprazole  40 mg Oral Daily   Continuous Infusions: . sodium chloride 50 mL/hr at 12/04/17 1000  .  ceFAZolin (ANCEF) IV Stopped (12/05/17 0218)  . lactated ringers     PRN Meds: acetaminophen, lactated ringers, metoprolol tartrate, ondansetron (ZOFRAN) IV, promethazine, traMADol   Vital Signs    Vitals:   12/04/17 2158 12/04/17 2200 12/05/17 0011 12/05/17 0440  BP:  (!) 141/99 (!) 151/62 (!) 137/59  Pulse: 60 (!) 59 60 (!) 59  Resp: 13 18 16 16   Temp:   98.5 F (36.9 C) 98.7 F (37.1 C)  TempSrc:   Oral Oral  SpO2: 96% 97% 96% 97%  Weight:    255 lb 11.7 oz (116 kg)  Height:        Intake/Output Summary (Last 24 hours) at 12/05/2017 0803 Last data filed at 12/05/2017 0400 Gross per 24 hour  Intake 570 ml  Output 600 ml  Net -30 ml   Filed Weights   12/03/17 0500 12/04/17 0451 12/05/17 0440  Weight: 261 lb 14.5 oz (118.8 kg) 261 lb 14.5 oz (118.8 kg) 255 lb 11.7 oz (116 kg)    Telemetry    AFlutter/V paced - Personally Reviewed  ECG    AFlutter/Vpaced-  Personally Reviewed  Physical Exam   GEN: No acute distress.   Neck: No JVD Cardiac: RRR (paced), soft SM. no rubs, or gallops.  Respiratory: CTA b/l. GI: Soft, nontender, non-distended  MS: No edema; age appropriate atrophy Neuro:  Nonfocal  Psych: Normal affect   Labs    Chemistry Recent Labs  Lab 12/02/17 0620  12/02/17 0855 12/02/17 1010 12/03/17 0349 12/05/17 0320  NA 137   < > 138 138 138 138  K 3.9   < > 4.3 4.4 3.9 3.9  CL 103   < > 102  --  103 101  CO2 21*  --   --   --  25 22  GLUCOSE 183*   < > 236* 239* 193* 192*  BUN 25*   < > 24*  --  22* 38*  CREATININE 1.98*   < > 1.70*  --  1.78* 1.70*  CALCIUM 9.5  --   --   --  9.0 8.4*  PROT 6.6  --   --   --   --   --   ALBUMIN 3.8  --   --   --   --   --  AST 28  --   --   --   --   --   ALT 23  --   --   --   --   --   ALKPHOS 34*  --   --   --   --   --   BILITOT 0.6  --   --   --   --   --   GFRNONAA 32*  --   --   --  36* 38*  GFRAA 37*  --   --   --  42* 44*  ANIONGAP 13  --   --   --  10 15   < > = values in this interval not displayed.     Hematology Recent Labs  Lab 11/28/17 1129  12/02/17 1010 12/02/17 1015 12/03/17 0349  WBC 6.8  --   --  9.1 11.6*  RBC 4.01*  --   --  3.31* 3.74*  HGB 13.1   < > 9.9* 11.0* 12.2*  HCT 38.6*   < > 29.0* 31.8* 35.4*  MCV 96.3  --   --  96.1 94.7  MCH 32.7  --   --  33.2 32.6  MCHC 33.9  --   --  34.6 34.5  RDW 13.4  --   --  13.1 13.0  PLT 211  --   --  143* 148*   < > = values in this interval not displayed.    Cardiac EnzymesNo results for input(s): TROPONINI in the last 168 hours. No results for input(s): TROPIPOC in the last 168 hours.   BNP Recent Labs  Lab 11/28/17 1129  BNP 47.9     DDimer No results for input(s): DDIMER in the last 168 hours.   Radiology    12/05/17: CXR IMPRESSION: New pacemaker without pneumothorax. Mild enlargement of cardiac silhouette post TAVR.  Cardiac Studies   12/03/17: TTE Study Conclusions - (current  admission, 12/02/2017). Edwards Sapien 3 THV 29 mm. - Left ventricle: The cavity size was normal. Systolic function was normal. The estimated ejection fraction was in the range of 55% to 60%. There is akinesis of the inferoseptal myocardium. Doppler parameters are consistent with abnormal left ventricular relaxation (grade 1 diastolic dysfunction). Doppler parameters are consistent with high ventricular filling pressure. - Aortic valve: A bioprosthesis was present. Peak velocity (S): 129 cm/s. Peak gradient (S): 7 mm Hg. Valve area (VTI): 2.42 cm^2. Valve area (Vmax): 2.6 cm^2. Valve area (Vmean): 2.43 cm^2. - Mitral valve: Calcified annulus. There was mild regurgitation.  10/08/17: R/LHC  Prox RCA lesion is 100% stenosed.  Ost LAD to Prox LAD lesion is 60% stenosed.  There is severe aortic valve stenosis.  Hemodynamic findings consistent with mild pulmonary hypertension.    Patient Profile     77 y.o. male with a hx of Paroxysmal AFib, DM, HTN, HLD, PAD (R pop art angioplasty 2015), CAD (known occ RCA fill w/collaterals), and severe AS, was admitted to [facility] 12/02/17 to undergo TAVR, which he had done, did well post-op had some SB in 40's, monitored in ICU, HR remained 50's of BB, on his amiodarone, and temp wires pulled 12/03/17 and moved to stepdown unit.    12/04/17 he developed symptomatic bradycardia with rates 20's and then an asystolic event and emergently had temp wire placed >> and subsequently PPM  AFIB hx Originally diagnosed 2012 Not again until 2016 >> DCCV 2018 incidentally noted again to have AF >> June 2018 DCCV though ERAF  Aug 2018 started on amiodarone Dr. Rayann Heman saw in Sept DCCV on amio Oct 2018 AFib clinic visit maintaining SR   Assessment & Plan    1. CHB, asystole     POD #2 from TAVR    S/p emergent temp wire yesterday >>> PPM    Chronically on amiodarone, no BB in days, Genella Bas need PPM  S/p PPM yesterday with Dr. Curt Bears, MDT dual  chamber Site looks good Sites care and activity restrictions were discussed with the patient Device check this AM with intact function CXR reviewed with Dr. Curt Bears, stable lead positions, no PTX  Routine post pacer f/u is in place EP service remains available, please recall if needed  2. PAFib     CHA2DS2Vasc is 4, outpatient on Pradaxa (held here given procedures)     OK from pacer standpoint to resume his a/c         For questions or updates, please contact [place] Please consult [email or web address] for contact info under Cardiology/STEMI.      Signed, Baldwin [place], [name]  12/05/2017, 8:04 AM    I have seen and examined this patient with Tommye Standard.  Agree with above, note added to reflect my findings.  On exam, RRR, no murmurs, lungs clear.  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  Device interrogation and chest x-ray without abnormalities.  Hazem Kenner arrange for follow-up in device clinic.  No further EP workup necessary at this time.  Vertis Bauder M. Sindhu Nguyen MD 12/05/2017 10:36 AM

## 2017-12-08 ENCOUNTER — Telehealth (HOSPITAL_COMMUNITY): Payer: Self-pay

## 2017-12-08 ENCOUNTER — Telehealth: Payer: Self-pay | Admitting: Physician Assistant

## 2017-12-08 MED FILL — Medication: Qty: 1 | Status: AC

## 2017-12-08 NOTE — Telephone Encounter (Signed)
  [place] VALVE TEAM   Patient contacted regarding discharge from [facility] on 12/05/17  Patient understands to follow up with provider Nell Range on 12/11/17 at 2:30pm [place].  Patient understands discharge instructions? yes Patient understands medications and regiment? yes Patient understands to bring all medications to this visit? yes  Angelena Form [name]  [facility]

## 2017-12-08 NOTE — Telephone Encounter (Signed)
Patients insurance is active and benefits verified through Medicare Part A & B - No co-pay, deductible amount of $185.00/$185.00 has been met, no out of pocket, 20% co-insurance, and no pre-authorization is required. Passport/reference [phone]  Patients insurance is active and benefits verified through [place] - No co-pay, deductible amount of $350.00/$0.00 has been met, out of pocket amount of $5,000/$45.00 has been met, 15% co-insurance, and no pre-authorization is required. Passport/reference [phone]  Patient will be contacted and scheduled after their follow up appt with the Cardiologist office upon review by the RN Navigator.

## 2017-12-09 ENCOUNTER — Encounter: Payer: Self-pay | Admitting: Thoracic Surgery (Cardiothoracic Vascular Surgery)

## 2017-12-09 ENCOUNTER — Telehealth: Payer: Self-pay

## 2017-12-09 NOTE — Telephone Encounter (Signed)
Edwena Felty with [facility] left a message stating patient had been in hospital and needs to make an appointment for hospital follow up as well as transition of care, Edwena Felty also would like for PCP to refer patient to [facility]  If he  needs  further manage with his chronic care to help him at home.  I placed a call to patient to schedule an appointment.[place]

## 2017-12-10 NOTE — Progress Notes (Addendum)
HEART AND [place]                                       Cardiology Office Note    Date:  12/11/2017   ID:  [name], DOB 1941/05/02, MRN [id number]  PCP:  [name], [name]  Cardiologist: Dr. Angelena Form / Dr. Angelena Form &Dr. Roxy Manns (TAVR)  CC: TOC follow s/p TAVR  History of Present Illness:  Steven Maldonado is a 77 y.o. 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   The patient's cardiac history dates back to 2012 when he suffered an acute myocardial infarction. At the time he was treated at [facility] where catheterization reportedly revealed occluded right coronary artery with collaterals. Medical therapy was recommended. The patient developed paroxysmal atrial fibrillation and has been followed intermittently ever since on long-term anticoagulation using Pradaxa.He was noted to have a heart murmur on physical exam and echocardiograms revealed aortic stenosis that has gradually progressed in severity. For the last several years the patient has been followed by Dr. Dillard Essex management of aortic stenosis, coronary artery disease, and atrial fibrillation. He has been followed by Dr. Billey Gosling the [facility] clinic. Last spring he was noted to be back in atrial fibrillation and he was referred to the atrial fibrillation clinic. He was loaded with amiodarone he has been maintaining sinus rhythm since he underwent repeat cardioversion performed September 2018. He was seen in follow-up recently by Dr. Eden Lathe he reported slow progression of symptoms of exertional shortness of breath and fatigue despite the fact that he was maintaining sinus rhythm. Transthoracic echocardiogram performed June 16, 2017 suggested significant  progression in the severity of aortic stenosis. The patient was in atrial fibrillation at the time of the echocardiogram, and peak velocity across the aortic valve range between different length cardiac cycles. Peak velocity was reported 3.3 m/s corresponding to mean transvalvular gradient estimated 26 mmHg, but the DVI was quite low at 0.19. The aortic valve area calculated only 0.8 cm. Left ventricular function remained stable with ejection fraction estimated 50-55%. The patient subsequently underwent left and right heart catheterization on October 08, 2017 by Dr. Angelena Form.Catheterization revealed the presence of severe aortic stenosis with peak to peak and mean transvalvular gradients measured 44 and 41.8 mmHg, respectively, corresponding to aortic valve area calculated 0.79 cm. There was moderate 60% proximal stenosis of the left anterior descending coronary artery that was felt not to be flow limiting. There was 100% chronic occlusion of the right coronary artery. There was otherwise nonobstructive coronary artery disease.   He underwent successful TAVR with a 29 mm Edwards Sapien THV via the R TF approach on 12/02/17. Post operative echoshowed EF 55-60% with normally functioning TAVR valve, no PVL and mean gradient 80mm Hg. He was discharged on ASA and home pradaxa. He developed hemodynamically significant bradycardia and syncope post operatively and required PPM placement. Dr. Curt Bears implanted a Medtronic Azure XT DR MRI SureScan dual-chamber pacemaker(serial number [id number]) on 12/04/17. He was in and out of atrial fib/flutter during his admission.  Today he presents to clinic for follow up. He has been doing well. No CP or SOB.  No  orthopnea or PND. No dizziness or syncope. No blood in stool or urine. No palpitations. Has had some worsening gas. Weight is up and he hasn't been eating more. Has some mild LE edema and abdominal distension. Overall breathing is better.     Past Medical  History:  Diagnosis Date  . Chronic diastolic CHF (congestive heart failure) ([place])   . CKD (chronic kidney disease), stage III ([place])   . Coronary artery disease    a. Cath February 2012 in [facility], occluded RCA with collaterals  . DM type 2 (diabetes mellitus, type 2) ([place])   . Essential hypertension   . Hyperlipidemia   . Pacemaker    a. symptomatic brady after TAVR s/p MDT PPM by Dr. Curt Bears 12/04/17  . Persistent atrial fibrillation ([place])   . PVD (peripheral vascular disease) ([place])    a. s/p R popliteal artery stenosis tx with drug-coated balloon 05/2014, followed by Dr. Fletcher Anon.  . 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 Severe aortic stenosis    a. 12/02/17: s/p TAVR  . Skin cancer   . Sleep apnea with use of continuous positive airway pressure (CPAP)    04-11-11 AHI was 32.9 and titrated to 15 cm H20, DME is [facility]  . Subclinical hypothyroidism     Past Surgical History:  Procedure Laterality Date  . ABDOMINAL ANGIOGRAM N/A 06/08/2014   Procedure: ABDOMINAL ANGIOGRAM;  Surgeon: Wellington Hampshire, MD;  Location: [facility] CATH LAB;  Service: Cardiovascular;  Laterality: N/A;  . APPENDECTOMY  1965  . CARDIAC CATHETERIZATION  12/2010  . CARDIOVERSION  07/2011  . CARDIOVERSION N/A 04/18/2014   Procedure: CARDIOVERSION;  Surgeon: Dorothy Spark, MD;  Location: [place];  Service: Cardiovascular;  Laterality: N/A;  . CARDIOVERSION N/A 11/03/2015   Procedure: CARDIOVERSION;  Surgeon: Lelon Perla, MD;  Location: [facility] ENDOSCOPY;  Service: Cardiovascular;  Laterality: N/A;  . CARDIOVERSION N/A 05/08/2017   Procedure: CARDIOVERSION;  Surgeon: Dorothy Spark, MD;  Location: [facility] ENDOSCOPY;  Service: Cardiovascular;  Laterality: N/A;  . CARDIOVERSION N/A 07/28/2017   Procedure: CARDIOVERSION;  Surgeon: Dorothy Spark, MD;  Location: [place];  Service:  Cardiovascular;  Laterality: N/A;  . LOWER EXTREMITY ANGIOGRAM N/A 06/08/2014   Procedure: LOWER EXTREMITY ANGIOGRAM;  Surgeon: Wellington Hampshire, MD;  Location: [facility] CATH LAB;  Service: Cardiovascular;  Laterality: N/A;  . PACEMAKER IMPLANT N/A 12/04/2017   Procedure: PACEMAKER IMPLANT;  Surgeon: Constance Haw, MD;  Location: [place] CV LAB;  Service: Cardiovascular;  Laterality: N/A;  . POPLITEAL ARTERY ANGIOPLASTY Right 06/08/2014   Archie Endo 06/08/2014  . RIGHT/LEFT HEART CATH AND CORONARY ANGIOGRAPHY N/A 10/08/2017   Procedure: RIGHT/LEFT HEART CATH AND CORONARY ANGIOGRAPHY;  Surgeon: Burnell Blanks, MD;  Location: [place] CV LAB;  Service: Cardiovascular;  Laterality: N/A;  . SKIN CANCER EXCISION Bilateral    "have had them cut off back of neck X 2; off left upper arm; right wrist, near right shoulder blade" (06/08/2014)  . TEE WITHOUT CARDIOVERSION N/A 12/02/2017   Procedure: TRANSESOPHAGEAL ECHOCARDIOGRAM (TEE);  Surgeon: Burnell Blanks, MD;  Location: [place];  Service: Open Heart Surgery;  Laterality: N/A;  . TEMPORARY PACEMAKER N/A 12/04/2017   Procedure: TEMPORARY PACEMAKER;  Surgeon: Leonie Man, MD;  Location: [place] CV LAB;  Service: Cardiovascular;  Laterality: N/A;  . TRANSCATHETER AORTIC VALVE REPLACEMENT, TRANSFEMORAL N/A 12/02/2017  Procedure: TRANSCATHETER AORTIC VALVE REPLACEMENT, TRANSFEMORAL;  Surgeon: Burnell Blanks, MD;  Location: [place];  Service: Open Heart Surgery;  Laterality: N/A;  using Edwards Sapien 3 Transcatheter Heart Valve size 51mm    Current Medications: Outpatient Medications Prior to Visit  Medication Sig Dispense Refill  . amiodarone (PACERONE) 200 MG tablet Take 1 tablet (200 mg total) by mouth daily. 90 tablet 3  . aspirin EC 81 MG tablet Take 81 mg by mouth daily.    [name] Kitchen atorvastatin (LIPITOR) 80 MG tablet TAKE 1 TABLET EVERY DAY AT BEDTIME 90 tablet 0  . BD PEN NEEDLE NANO U/F 32G X 4 MM MISC USE 4 (FOUR) TIMES  DAILY. 400 each 1  . Blood Glucose Monitoring Suppl (FREESTYLE LITE) DEVI 1 each by Does not apply route 2 (two) times daily. 1 each 0  . Choline Fenofibrate 135 MG capsule TAKE 1 CAPSULE DAILY 90 capsule 3  . CINNAMON PO Take 1 tablet by mouth 2 (two) times daily.    . diphenhydramine-acetaminophen (TYLENOL PM) 25-500 MG TABS tablet Take 1 tablet by mouth at bedtime as needed (sleep).    . docusate sodium (COLACE) 100 MG capsule Take 100 mg daily by mouth.     . fesoterodine (TOVIAZ) 4 MG TB24 tablet Take 4 mg by mouth daily.    . furosemide (LASIX) 20 MG tablet Take 1 tablet (20 mg total) by mouth daily. 90 tablet 3  . glipiZIDE (GLUCOTROL XL) 10 MG 24 hr tablet TAKE 1 TABLET (10 MG TOTAL) BY MOUTH DAILY WITH BREAKFAST. 30 tablet 5  . glucose blood (FREESTYLE LITE) test strip 1 each by Other route 4 (four) times daily -  before meals and at bedtime. 450 each 3  . guaifenesin (ROBITUSSIN) 100 MG/5ML syrup Take 200 mg by mouth 3 (three) times daily as needed for cough.    . Insulin Glargine (BASAGLAR KWIKPEN) 100 UNIT/ML SOPN Inject 0.65 mLs (65 Units total) into the skin at bedtime. 30 mL 0  . insulin lispro (HUMALOG) 100 UNIT/ML KiwkPen Inject 0.05 mLs (5 Units total) into the skin 3 (three) times daily. 15 mL 3  . levothyroxine (SYNTHROID) 25 MCG tablet Take 1 tablet by mouth daily for 3 weeks. Then increase to 2 tablets daily 60 tablet 1  . magnesium oxide (MAG-OX) 400 MG tablet TAKE 1 TABLET (400 MG TOTAL) BY MOUTH DAILY. 30 tablet 11  . metoprolol tartrate (LOPRESSOR) 50 MG tablet Take one and one-half tablet by mouth twice a day (Patient taking differently: Take 75 mg by mouth 2 (two) times daily. ) 270 tablet 3  . MYRBETRIQ 25 MG TB24 tablet TAKE 1 TABLET BY MOUTH EVERY DAY 90 tablet 0  . PRADAXA 150 MG CAPS capsule TAKE 1 CAPSULE EVERY 12    HOURS 180 capsule 3   No facility-administered medications prior to visit.      Allergies:   Patient has no known allergies.   Social History    Socioeconomic History  . Marital status: Married    Spouse name: [name]  . Number of children: 1  . Years of education: 37  . Highest education level: None  Social Needs  . Financial resource strain: None  . Food insecurity - worry: None  . Food insecurity - inability: None  . Transportation needs - medical: None  . Transportation needs - non-medical: None  Occupational History  . Occupation: retired    Fish farm manager: [employer]  Tobacco Use  . Smoking status: Former Smoker  Packs/day: 2.00    Years: 14.00    Pack years: 28.00    Types: Cigarettes    Last attempt to quit: 11/11/1972    Years since quitting: 45.1  . Smokeless tobacco: Never Used  Substance and Sexual Activity  . Alcohol use: No    Comment: quit in 1984  . Drug use: No  . Sexual activity: Not Currently  Other Topics Concern  . None  Social History Narrative   Patient is married Engineer, drilling) and lives at home with his wife.   Patient has one child and his wife has one child.   Patient is retired.   Patient has a high school education.   Patient is right-handed.   Patient drinks very little caffeine.     Family History:  The patient's family history includes Diabetes in his brother, daughter, father, mother, other, and sister; Heart Problems in his daughter; Heart attack in his father and mother; Heart failure in his father; Hypertension in his brother, father, mother, and sister; Stroke in his brother.      ROS:   Please see the history of present illness.    ROS All other systems reviewed and are negative.   PHYSICAL EXAM:   VS:  BP 122/64   Pulse 60   Resp 16   Ht 5\' 11"  (1.803 m)   Wt 262 lb (118.8 kg)   SpO2 98%   BMI 36.54 kg/m    GEN: Well nourished, well developed, in no acute distress, obese  HEENT: normal  Neck: no JVD, carotid bruits, or masses Cardiac: RRR; no murmurs, rubs, or gallops, 1+ LE edema  Respiratory:  clear to auscultation bilaterally, normal work of breathing GI:  abdomen slightly distended.  MS: no deformity or atrophy  Skin: warm and dry, no rash Neuro:  Alert and Oriented x 3, Strength and sensation are intact Psych: euthymic mood, full affect    Wt Readings from Last 3 Encounters:  12/11/17 262 lb (118.8 kg)  12/05/17 255 lb 11.7 oz (116 kg)  11/28/17 251 lb 4 oz (114 kg)      Studies/Labs Reviewed:   EKG:  EKG is NOT ordered today.    Recent Labs: 10/13/2017: TSH 44.03 11/28/2017: B Natriuretic Peptide 47.9 12/02/2017: ALT 23 12/03/2017: Magnesium 1.9 12/05/2017: BUN 38; Creatinine, Ser 1.70; Hemoglobin 11.9; Platelets 95; Potassium 3.9; Sodium 138   Lipid Panel    Component Value Date/Time   CHOL 155 10/13/2017 1224   TRIG 144 10/13/2017 1224   HDL 38 (L) 10/13/2017 1224   CHOLHDL 4.1 10/13/2017 1224   VLDL 32 (H) 05/01/2017 1216   [place]ase 95 05/01/2017 1216    Additional studies/ records that were reviewed today include:  TAVR OPERATIVE NOTE   Date of Procedure:12/02/2017 Procedure:   Transcatheter Aortic Valve Replacement - PercutaneousRightTransfemoral Approach Edwards Sapien 3 THV (size 73mm, model # 9600TFX, serial # [id number])  Co-Surgeons:Christopher Angelena Form, MD andClarence H. Roxy Manns, MD   Pre-operative Echo Findings: ? Severe aortic stenosis ? Normalleft ventricular systolic function  Post-operative Echo Findings: ? Noparavalvular leak ? Normalleft ventricular systolic function   ____________  Post operative echo 12/03/17 Study Conclusions - (current admission, 12/02/2017). Edwards Sapien 3 THV 29 mm. - Left ventricle: The cavity size was normal. Systolic function was normal. The estimated ejection fraction was in the range of 55% to 60%. There is akinesis of the inferoseptal myocardium. Doppler parameters are consistent with abnormal left ventricular relaxation (grade 1 diastolic dysfunction). Doppler parameters are  consistent with  high ventricular filling pressure. - Aortic valve: A bioprosthesis was present. Peak velocity (S): 129 cm/s. Peak gradient (S): 7 mm Hg. Valve area (VTI): 2.42 cm^2. Valve area (Vmax): 2.6 cm^2. Valve area (Vmean): 2.43 cm^2. - Mitral valve: Calcified annulus. There was mild regurgitation. Complications: Temporary Pacemaker still in, patient unable to turn onto his left side.   ASSESSMENT & PLAN:   Severe AS s/p TAVR: doing well. Groin sites are stable. He is feeling a lot better. He has full dentures but will let us know if he needs a refitting and get Abx. I will see him back for 1 month echo and follow up in valve clinic on 12/13   Symptomatic Bradycardia s/p PPM: he has a wound check next week. He will follow with Dr. Ileana Ladd  Acute on chronic diastolic CHF: his weight is up 10 lbs by home scales and 7 lbs by our scales. He has some LE edema and abdominal distention. Will increase his lasix from 20mg  daily to 40mg  x3 days. I will check and BMET and BNP today and another BMET next week.   HTN: BP well controlled today  PAF: continue amiodarone and pradaxa  CKD: creat 1.7 at DC, will check BMET today  DMT2: continue current regimen  OSA: continue CPAP  CAD: continue medical therapy   Carotid artery disease: pre TAVR dopplers showed 24-23% stenosis LICA stenosis. This will need to be followed  Medication Adjustments/Labs and Tests Ordered: Current medicines are reviewed at length with the patient today.  Concerns regarding medicines are outlined above.  Medication changes, Labs and Tests ordered today are listed in the Patient Instructions below. Patient Instructions  Medication Instructions:  Your physician has recommended you make the following change in your medication: Increase furosemide to 40 mg daily for 3 days    Labwork: Lab work to be done today--BMP, BNP  Your physician recommends that you return for lab work on 2/6 when here for wound  check (BMP)   Testing/Procedures: Your physician has requested that you have an echocardiogram. Echocardiography is a painless test that uses sound waves to create images of your heart. It provides your doctor with information about the size and shape of your heart and how well your heart's chambers and valves are working. This procedure takes approximately one hour. There are no restrictions for this procedure.  Scheduled for 2/13  Follow-Up: Follow up with K. Grandville Silos as planned on 2/13  Any Other Special Instructions Will Be Listed Below (If Applicable).  Your physician discussed the importance of taking an antibiotic prior to any dental, gastrointestinal, genitourinary procedures to prevent damage to the heart valves from infection. Call us prior to appointments and an antibiotic can be sent to your pharmacy      If you need a refill on your cardiac medications before your next appointment, please call your pharmacy.      Signed, Angelena Form, [name]  12/11/2017 3:17 PM    [facility] [place], [place], [place]  [address] Phone: [phone]; Fax: [phone]

## 2017-12-11 ENCOUNTER — Encounter: Payer: Self-pay | Admitting: Physician Assistant

## 2017-12-11 ENCOUNTER — Ambulatory Visit (INDEPENDENT_AMBULATORY_CARE_PROVIDER_SITE_OTHER): Payer: Medicare Other | Admitting: Physician Assistant

## 2017-12-11 ENCOUNTER — Other Ambulatory Visit: Payer: Self-pay

## 2017-12-11 VITALS — BP 122/64 | HR 60 | Resp 16 | Ht 71.0 in | Wt 262.0 lb

## 2017-12-11 DIAGNOSIS — I10 Essential (primary) hypertension: Secondary | ICD-10-CM

## 2017-12-11 DIAGNOSIS — I5033 Acute on chronic diastolic (congestive) heart failure: Secondary | ICD-10-CM

## 2017-12-11 DIAGNOSIS — I2510 Atherosclerotic heart disease of native coronary artery without angina pectoris: Secondary | ICD-10-CM

## 2017-12-11 DIAGNOSIS — N189 Chronic kidney disease, unspecified: Secondary | ICD-10-CM

## 2017-12-11 DIAGNOSIS — E118 Type 2 diabetes mellitus with unspecified complications: Secondary | ICD-10-CM | POA: Diagnosis not present

## 2017-12-11 DIAGNOSIS — Z952 Presence of prosthetic heart valve: Secondary | ICD-10-CM

## 2017-12-11 DIAGNOSIS — I6523 Occlusion and stenosis of bilateral carotid arteries: Secondary | ICD-10-CM

## 2017-12-11 DIAGNOSIS — Z9989 Dependence on other enabling machines and devices: Secondary | ICD-10-CM

## 2017-12-11 DIAGNOSIS — Z950 Presence of cardiac pacemaker: Secondary | ICD-10-CM | POA: Diagnosis not present

## 2017-12-11 DIAGNOSIS — G4733 Obstructive sleep apnea (adult) (pediatric): Secondary | ICD-10-CM

## 2017-12-11 DIAGNOSIS — I350 Nonrheumatic aortic (valve) stenosis: Secondary | ICD-10-CM

## 2017-12-11 NOTE — Patient Instructions (Addendum)
Medication Instructions:  Your physician has recommended you make the following change in your medication: Increase furosemide to 40 mg daily for 3 days    Labwork: Lab work to be done today--BMP, BNP  Your physician recommends that you return for lab work on 2/6 when here for wound check (BMP)   Testing/Procedures: Your physician has requested that you have an echocardiogram. Echocardiography is a painless test that uses sound waves to create images of your heart. It provides your doctor with information about the size and shape of your heart and how well your heart's chambers and valves are working. This procedure takes approximately one hour. There are no restrictions for this procedure.  Scheduled for 2/13  Follow-Up: Follow up with K. Grandville Silos as planned on 2/13  Any Other Special Instructions Will Be Listed Below (If Applicable).  Your physician discussed the importance of taking an antibiotic prior to any dental, gastrointestinal, genitourinary procedures to prevent damage to the heart valves from infection. Call us prior to appointments and an antibiotic can be sent to your pharmacy      If you need a refill on your cardiac medications before your next appointment, please call your pharmacy.

## 2017-12-12 LAB — BASIC METABOLIC PANEL
BUN/Creatinine Ratio: 11 (ref 10–24)
BUN: 17 mg/dL (ref 8–27)
CO2: 27 mmol/L (ref 20–29)
Calcium: 10.1 mg/dL (ref 8.6–10.2)
Chloride: 98 mmol/L (ref 96–106)
Creatinine, Ser: 1.55 mg/dL — ABNORMAL HIGH (ref 0.76–1.27)
GFR calc Af Amer: 50 mL/min/{1.73_m2} — ABNORMAL LOW (ref >59)
GFR calc non Af Amer: 43 mL/min/{1.73_m2} — ABNORMAL LOW (ref >59)
Glucose: 146 mg/dL — ABNORMAL HIGH (ref 65–99)
Potassium: 4.7 mmol/L (ref 3.5–5.2)
Sodium: 140 mmol/L (ref 134–144)

## 2017-12-12 LAB — PRO B NATRIURETIC PEPTIDE: NT-Pro BNP: 271 pg/mL (ref 0–376)

## 2017-12-15 ENCOUNTER — Encounter: Payer: Self-pay | Admitting: Physician Assistant

## 2017-12-15 ENCOUNTER — Ambulatory Visit (INDEPENDENT_AMBULATORY_CARE_PROVIDER_SITE_OTHER): Payer: Medicare Other | Admitting: Physician Assistant

## 2017-12-15 ENCOUNTER — Other Ambulatory Visit: Payer: Self-pay

## 2017-12-15 VITALS — BP 118/58 | HR 61 | Temp 97.9°F | Resp 14 | Ht 71.0 in | Wt 256.6 lb

## 2017-12-15 DIAGNOSIS — E039 Hypothyroidism, unspecified: Secondary | ICD-10-CM

## 2017-12-15 DIAGNOSIS — E119 Type 2 diabetes mellitus without complications: Secondary | ICD-10-CM | POA: Diagnosis not present

## 2017-12-15 DIAGNOSIS — Z09 Encounter for follow-up examination after completed treatment for conditions other than malignant neoplasm: Secondary | ICD-10-CM

## 2017-12-15 DIAGNOSIS — R001 Bradycardia, unspecified: Secondary | ICD-10-CM

## 2017-12-15 DIAGNOSIS — Z952 Presence of prosthetic heart valve: Secondary | ICD-10-CM

## 2017-12-15 DIAGNOSIS — Z950 Presence of cardiac pacemaker: Secondary | ICD-10-CM

## 2017-12-15 DIAGNOSIS — Z794 Long term (current) use of insulin: Secondary | ICD-10-CM

## 2017-12-15 DIAGNOSIS — I6523 Occlusion and stenosis of bilateral carotid arteries: Secondary | ICD-10-CM | POA: Diagnosis not present

## 2017-12-15 NOTE — Progress Notes (Signed)
Patient ID: Steven Maldonado MRN: [id number], DOB: [birth year], 77 y.o. Date of Encounter: @DATE @  Chief Complaint:  Chief Complaint  Patient presents with  . Hospitalization Follow-up    HPI: 77 y.o. year old male  presents follow-up after recent hospitalization.  Today I have reviewed his hospital discharge summary. Hospitalized 12/02/2017 -  12/05/2017.  Was hospitalized to undergo TAVR for management of severe AS.  He developed hemodynamically significant bradycardia status post TAVR and required permanent pacemaker implantation.  Otherwise had an uncomplicated hospital course and is recovering well.  I reviewed his follow-up office visit at cardiology 12/11/2017.  At that visit it was noted that his weight was up despite that he not been eating more than usual.  Also he had some mild LE edema and abdominal distention.  He was instructed to increase Lasix from 20 mg daily to 40 mg daily for 3 days.  Labs were checked and would be rechecked at his next visit in 1 week.  Today I reviewed that his weight at the cardiology visit was 262 pounds.  Weight here today 256 pounds. Patient reports that he has been taking the increased dose of Lasix as directed.  States that he can tell that the tightness and swelling in his lower legs is improved.  States that he does have follow-up with Cardiology 1 week.  Reviewed that he had A1c at the hospital 11/28/17 and A1c was much improved and down to 8.1 !  Today I congratulated him on his dietary compliance!!  Today I reviewed that at lab 10/13/17 TSH was high and T4 was low and we had him start thyroid 25 mcg daily.  Today he reports that he has been taking this daily as directed.  He has had no follow-up lab to monitor this and is due to follow this up today.  Otherwise he has no specific concerns to address today and states that he is feeling good.    Past Medical History:  Diagnosis Date  . Chronic diastolic CHF (congestive heart failure)  ([place])   . CKD (chronic kidney disease), stage III ([place])   . Coronary artery disease    a. Cath February 2012 in [facility], occluded RCA with collaterals  . DM type 2 (diabetes mellitus, type 2) ([place])   . Essential hypertension   . Hyperlipidemia   . Pacemaker    a. symptomatic brady after TAVR s/p MDT PPM by Dr. Curt Bears 12/04/17  . Persistent atrial fibrillation ([place])   . PVD (peripheral vascular disease) ([place])    a. s/p R popliteal artery stenosis tx with drug-coated balloon 05/2014, followed by Dr. Fletcher Anon.  . 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 Severe aortic stenosis    a. 12/02/17: s/p TAVR  . Skin cancer   . Sleep apnea with use of continuous positive airway pressure (CPAP)    04-11-11 AHI was 32.9 and titrated to 15 cm H20, DME is [facility]  . Subclinical hypothyroidism      Home Meds: Outpatient Medications Prior to Visit  Medication Sig Dispense Refill  . amiodarone (PACERONE) 200 MG tablet Take 1 tablet (200 mg total) by mouth daily. 90 tablet 3  . aspirin EC 81 MG tablet Take 81 mg by mouth daily.    [name] Kitchen atorvastatin (LIPITOR) 80 MG tablet TAKE 1 TABLET EVERY DAY AT BEDTIME 90 tablet 0  . BD PEN  NEEDLE NANO U/F 32G X 4 MM MISC USE 4 (FOUR) TIMES DAILY. 400 each 1  . Blood Glucose Monitoring Suppl (FREESTYLE LITE) DEVI 1 each by Does not apply route 2 (two) times daily. 1 each 0  . Choline Fenofibrate 135 MG capsule TAKE 1 CAPSULE DAILY 90 capsule 3  . CINNAMON PO Take 1 tablet by mouth 2 (two) times daily.    . diphenhydramine-acetaminophen (TYLENOL PM) 25-500 MG TABS tablet Take 1 tablet by mouth at bedtime as needed (sleep).    . docusate sodium (COLACE) 100 MG capsule Take 100 mg daily by mouth.     . fesoterodine (TOVIAZ) 4 MG TB24 tablet Take 4 mg by mouth daily.    . furosemide (LASIX) 20 MG tablet Take 1 tablet (20 mg total) by mouth daily. 90 tablet  3  . glipiZIDE (GLUCOTROL XL) 10 MG 24 hr tablet TAKE 1 TABLET (10 MG TOTAL) BY MOUTH DAILY WITH BREAKFAST. 30 tablet 5  . glucose blood (FREESTYLE LITE) test strip 1 each by Other route 4 (four) times daily -  before meals and at bedtime. 450 each 3  . Insulin Glargine (BASAGLAR KWIKPEN) 100 UNIT/ML SOPN Inject 0.65 mLs (65 Units total) into the skin at bedtime. 30 mL 0  . insulin lispro (HUMALOG) 100 UNIT/ML KiwkPen Inject 0.05 mLs (5 Units total) into the skin 3 (three) times daily. 15 mL 3  . levothyroxine (SYNTHROID) 25 MCG tablet Take 1 tablet by mouth daily for 3 weeks. Then increase to 2 tablets daily 60 tablet 1  . magnesium oxide (MAG-OX) 400 MG tablet TAKE 1 TABLET (400 MG TOTAL) BY MOUTH DAILY. 30 tablet 11  . metoprolol tartrate (LOPRESSOR) 50 MG tablet Take one and one-half tablet by mouth twice a day (Patient taking differently: Take 75 mg by mouth 2 (two) times daily. ) 270 tablet 3  . MYRBETRIQ 25 MG TB24 tablet TAKE 1 TABLET BY MOUTH EVERY DAY 90 tablet 0  . PRADAXA 150 MG CAPS capsule TAKE 1 CAPSULE EVERY 12    HOURS 180 capsule 3  . guaifenesin (ROBITUSSIN) 100 MG/5ML syrup Take 200 mg by mouth 3 (three) times daily as needed for cough.     No facility-administered medications prior to visit.     Allergies: No Known Allergies  Social History   Socioeconomic History  . Marital status: Married    Spouse name: [name]  . Number of children: 1  . Years of education: 49  . Highest education level: Not on file  Social Needs  . Financial resource strain: Not on file  . Food insecurity - worry: Not on file  . Food insecurity - inability: Not on file  . Transportation needs - medical: Not on file  . Transportation needs - non-medical: Not on file  Occupational History  . Occupation: retired    Fish farm manager: [employer]  Tobacco Use  . Smoking status: Former Smoker    Packs/day: 2.00    Years: 14.00    Pack years: 28.00    Types: Cigarettes    Last attempt to quit:  11/11/1972    Years since quitting: 45.1  . Smokeless tobacco: Never Used  Substance and Sexual Activity  . Alcohol use: No    Comment: quit in 1984  . Drug use: No  . Sexual activity: Not Currently  Other Topics Concern  . 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   Patient  has one child and his wife has one child.   Patient is retired.   Patient has a high school education.   Patient is right-handed.   Patient drinks very little caffeine.    Family History  Problem Relation Age of Onset  . Diabetes Mother   . Heart attack Mother   . Hypertension Mother   . Heart attack Father   . Heart failure Father   . Hypertension Father   . Diabetes Father   . Diabetes Sister   . Diabetes Brother   . Diabetes Other   . Diabetes Daughter        TYPE ll  . Heart Problems Daughter   . Hypertension Sister   . Hypertension Brother   . Stroke Brother      Review of Systems:  See HPI for pertinent ROS. All other ROS negative.    Physical Exam: Blood pressure (!) 118/58, pulse 61, temperature 97.9 F (36.6 C), temperature source Oral, resp. rate 14, height 5\' 11"  (1.803 m), weight 116.4 kg (256 lb 9.6 oz), SpO2 98 %., Body mass index is 35.79 kg/m. General: Obese WM. Appears in no acute distress. Neck: Supple. No thyromegaly. No lymphadenopathy. Lungs: Clear bilaterally to auscultation without wheezes, rales, or rhonchi. Breathing is unlabored. Heart: RRR with S1 S2. No murmurs, rubs, or gallops. Musculoskeletal:  Strength and tone normal for age. Extremities/Skin: Warm and dry. 1+ pitting edema bilateral lower extremities. Neuro: Alert and oriented X 3. Moves all extremities spontaneously. Gait is normal. CNII-XII grossly in tact. Psych:  Responds to questions appropriately with a normal affect.     ASSESSMENT AND PLAN:  77 y.o. year old male with  1. Hospital discharge follow-up Cardiology is adjusting his diuretics  and is following this up.    2. S/P TAVR (transcatheter aortic valve replacement) Have follow-up with cardiology and CVT S.  3. Hypothyroidism, unspecified type - TSH  4. Symptomatic bradycardia S/ post pacemaker 5. Pacemaker Per Cardiology 6. Type 2 diabetes mellitus without complication, with long-term current use of insulin (HCC)  A1C improved and he is continuing compliance with diet as well as meds.   Marin Olp [place], [place], [facility] 12/15/2017 4:04 PM

## 2017-12-16 LAB — TSH: TSH: 53.16 mIU/L — ABNORMAL HIGH (ref 0.40–4.50)

## 2017-12-17 ENCOUNTER — Other Ambulatory Visit: Payer: Medicare Other | Admitting: *Deleted

## 2017-12-17 ENCOUNTER — Encounter: Payer: Self-pay | Admitting: Physician Assistant

## 2017-12-17 ENCOUNTER — Telehealth: Payer: Self-pay

## 2017-12-17 ENCOUNTER — Ambulatory Visit (INDEPENDENT_AMBULATORY_CARE_PROVIDER_SITE_OTHER): Payer: Medicare Other | Admitting: *Deleted

## 2017-12-17 DIAGNOSIS — Z950 Presence of cardiac pacemaker: Secondary | ICD-10-CM | POA: Diagnosis not present

## 2017-12-17 DIAGNOSIS — Z952 Presence of prosthetic heart valve: Secondary | ICD-10-CM | POA: Diagnosis not present

## 2017-12-17 DIAGNOSIS — R001 Bradycardia, unspecified: Secondary | ICD-10-CM | POA: Diagnosis not present

## 2017-12-17 DIAGNOSIS — N189 Chronic kidney disease, unspecified: Secondary | ICD-10-CM

## 2017-12-17 DIAGNOSIS — I2510 Atherosclerotic heart disease of native coronary artery without angina pectoris: Secondary | ICD-10-CM

## 2017-12-17 LAB — CUP PACEART INCLINIC DEVICE CHECK
Battery Remaining Longevity: 131 mo
Battery Voltage: 3.19 V
Brady Statistic AP VP Percent: 50.78 %
Brady Statistic AP VS Percent: 0 %
Brady Statistic AS VP Percent: 49.09 %
Brady Statistic AS VS Percent: 0.13 %
Brady Statistic RA Percent Paced: 49.18 %
Brady Statistic RV Percent Paced: 99.87 %
Date Time Interrogation Session: 20190206161916
Implantable Lead Implant Date: 20190124
Implantable Lead Implant Date: 20190124
Implantable Lead Location: 753859
Implantable Lead Location: 753860
Implantable Lead Model: 5076
Implantable Lead Model: 5076
Implantable Pulse Generator Implant Date: 20190124
Lead Channel Impedance Value: 361 Ohm
Lead Channel Impedance Value: 380 Ohm
Lead Channel Impedance Value: 475 Ohm
Lead Channel Impedance Value: 513 Ohm
Lead Channel Pacing Threshold Amplitude: 0.75 V
Lead Channel Pacing Threshold Amplitude: 1 V
Lead Channel Pacing Threshold Pulse Width: 0.4 ms
Lead Channel Pacing Threshold Pulse Width: 0.4 ms
Lead Channel Sensing Intrinsic Amplitude: 2.5 mV
Lead Channel Setting Pacing Amplitude: 3.5 V
Lead Channel Setting Pacing Amplitude: 3.5 V
Lead Channel Setting Pacing Pulse Width: 0.4 ms
Lead Channel Setting Sensing Sensitivity: 4 mV

## 2017-12-17 MED ORDER — LEVOTHYROXINE SODIUM 25 MCG PO TABS: ORAL_TABLET | ORAL | 0 refills | Status: DC

## 2017-12-17 NOTE — Telephone Encounter (Signed)
Tell patient that he cannot take this medication twice a day. This medication has to be taken first thing in the morning 30 minutes prior to any food or drink or other medications.

## 2017-12-17 NOTE — Telephone Encounter (Signed)
-----   Message from [name], [name] sent at 12/16/2017 11:42 AM EST ----- See Result note from 10/13/2017 and med note attached to his thyroid medication-- ---10/14/2017---started 53mcg daily for 3 weeks then was to increase to taking 2 daily----VERIFY with pt that he took as directed.  If he is NOT--Let me know.  If he is taking as directed--then--increase to taking 3 daily for 1 week then increase to taking 4 daily. Recheck TSH 4 weeks.

## 2017-12-17 NOTE — Addendum Note (Signed)
Addended by: Vonna Kotyk A on: 12/17/2017 04:58 PM   Modules accepted: Orders

## 2017-12-17 NOTE — Telephone Encounter (Signed)
Call placed to patient he states he is taking his levothyroxine 25 mcg bid. Patient aware of changes with medication

## 2017-12-17 NOTE — Progress Notes (Signed)
Wound check appointment. Steri-strips removed. Wound without redness or edema. Incision edges approximated, wound well healed. Normal device function. Thresholds, sensing, and impedances consistent with implant measurements. Device programmed at 3.5V for extra safety margin until 3 month visit. Histogram distribution appropriate for patient and level of activity. 1 mode switches (3.3%)+ pradaxa, amiodarone-- EGM appears AF, duration 24 hours 35 minutes, Max A rate 353 bpm or high ventricular rates noted. Patient educated about wound care, arm mobility, lifting restrictions. ROV with WC 03/04/18

## 2017-12-17 NOTE — Telephone Encounter (Signed)
Call placed to patient spoke with his wife [name] she is aware patient should take medication in the am before breakfast.

## 2017-12-18 LAB — BASIC METABOLIC PANEL
BUN/Creatinine Ratio: 12 (ref 10–24)
BUN: 23 mg/dL (ref 8–27)
CO2: 26 mmol/L (ref 20–29)
Calcium: 9.4 mg/dL (ref 8.6–10.2)
Chloride: 99 mmol/L (ref 96–106)
Creatinine, Ser: 1.9 mg/dL — ABNORMAL HIGH (ref 0.76–1.27)
GFR calc Af Amer: 39 mL/min/{1.73_m2} — ABNORMAL LOW (ref >59)
GFR calc non Af Amer: 34 mL/min/{1.73_m2} — ABNORMAL LOW (ref >59)
Glucose: 290 mg/dL — ABNORMAL HIGH (ref 65–99)
Potassium: 4.5 mmol/L (ref 3.5–5.2)
Sodium: 138 mmol/L (ref 134–144)

## 2017-12-22 DIAGNOSIS — X32XXXA Exposure to sunlight, initial encounter: Secondary | ICD-10-CM | POA: Diagnosis not present

## 2017-12-22 DIAGNOSIS — D0461 Carcinoma in situ of skin of right upper limb, including shoulder: Secondary | ICD-10-CM | POA: Diagnosis not present

## 2017-12-22 DIAGNOSIS — C44319 Basal cell carcinoma of skin of other parts of face: Secondary | ICD-10-CM | POA: Diagnosis not present

## 2017-12-22 DIAGNOSIS — Z85828 Personal history of other malignant neoplasm of skin: Secondary | ICD-10-CM | POA: Diagnosis not present

## 2017-12-22 DIAGNOSIS — D485 Neoplasm of uncertain behavior of skin: Secondary | ICD-10-CM | POA: Diagnosis not present

## 2017-12-22 DIAGNOSIS — L570 Actinic keratosis: Secondary | ICD-10-CM | POA: Diagnosis not present

## 2017-12-22 DIAGNOSIS — L821 Other seborrheic keratosis: Secondary | ICD-10-CM | POA: Diagnosis not present

## 2017-12-22 DIAGNOSIS — Z08 Encounter for follow-up examination after completed treatment for malignant neoplasm: Secondary | ICD-10-CM | POA: Diagnosis not present

## 2017-12-24 ENCOUNTER — Ambulatory Visit (HOSPITAL_COMMUNITY): Payer: Medicare Other | Attending: Cardiovascular Disease

## 2017-12-24 ENCOUNTER — Encounter: Payer: Self-pay | Admitting: Physician Assistant

## 2017-12-24 ENCOUNTER — Other Ambulatory Visit: Payer: Self-pay

## 2017-12-24 ENCOUNTER — Ambulatory Visit (INDEPENDENT_AMBULATORY_CARE_PROVIDER_SITE_OTHER): Payer: Medicare Other | Admitting: Physician Assistant

## 2017-12-24 VITALS — BP 128/56 | HR 60 | Ht 71.0 in | Wt 251.4 lb

## 2017-12-24 DIAGNOSIS — I081 Rheumatic disorders of both mitral and tricuspid valves: Secondary | ICD-10-CM | POA: Insufficient documentation

## 2017-12-24 DIAGNOSIS — G4733 Obstructive sleep apnea (adult) (pediatric): Secondary | ICD-10-CM

## 2017-12-24 DIAGNOSIS — I6523 Occlusion and stenosis of bilateral carotid arteries: Secondary | ICD-10-CM | POA: Diagnosis not present

## 2017-12-24 DIAGNOSIS — Z950 Presence of cardiac pacemaker: Secondary | ICD-10-CM | POA: Diagnosis not present

## 2017-12-24 DIAGNOSIS — I5032 Chronic diastolic (congestive) heart failure: Secondary | ICD-10-CM

## 2017-12-24 DIAGNOSIS — Z952 Presence of prosthetic heart valve: Secondary | ICD-10-CM

## 2017-12-24 DIAGNOSIS — E118 Type 2 diabetes mellitus with unspecified complications: Secondary | ICD-10-CM | POA: Diagnosis not present

## 2017-12-24 DIAGNOSIS — I350 Nonrheumatic aortic (valve) stenosis: Secondary | ICD-10-CM | POA: Diagnosis not present

## 2017-12-24 DIAGNOSIS — I10 Essential (primary) hypertension: Secondary | ICD-10-CM

## 2017-12-24 DIAGNOSIS — I480 Paroxysmal atrial fibrillation: Secondary | ICD-10-CM | POA: Diagnosis not present

## 2017-12-24 DIAGNOSIS — N189 Chronic kidney disease, unspecified: Secondary | ICD-10-CM | POA: Diagnosis not present

## 2017-12-24 DIAGNOSIS — Z9989 Dependence on other enabling machines and devices: Secondary | ICD-10-CM

## 2017-12-24 DIAGNOSIS — Z954 Presence of other heart-valve replacement: Secondary | ICD-10-CM | POA: Diagnosis not present

## 2017-12-24 NOTE — Patient Instructions (Addendum)
Medication Instructions:  1) STOP ASPIRIN June 01, 2018. You will be called as a reminder.  Labwork: TODAY: BMET  Testing/Procedures: Your physician has requested that you have a carotid duplex in June, 2019. This test is an ultrasound of the carotid arteries in your neck. It looks at blood flow through these arteries that supply the brain with blood. Allow one hour for this exam. There are no restrictions or special instructions.   Your provider has requested that you have an echocardiogram in 1 year on the same day you see Nell Range. You will be called to arrange this appointment when Katie's schedule opens. Echocardiography is a painless test that uses sound waves to create images of your heart. It provides your doctor with information about the size and shape of your heart and how well your heart's chambers and valves are working. This procedure takes approximately one hour. There are no restrictions for this procedure.  Follow-Up: Your provider recommends that you schedule a follow-up appointment in 4 MONTHS with Dr. Angelena Form.  Your provider wants you to follow-up in: 1 year with Nell Range, PA. You will receive a reminder letter in the mail two months in advance. If you don't receive a letter, please call our office to schedule the follow-up appointment.    Any Other Special Instructions Will Be Listed Below (If Applicable). Katie sent a message to Dena Billet about your neuropathy medication. She recommends Gabapentin (Neurontin).   If you need a refill on your cardiac medications before your next appointment, please call your pharmacy.

## 2017-12-24 NOTE — Progress Notes (Addendum)
[facility] [place]                                       Cardiology Office Note    Date:  12/25/2017   ID:  Steven Maldonado, DOB [birth year], MRN [id number]  PCP:  [name], [name]  Cardiologist: Dr. Angelena Form / Dr. Angelena Form &Dr. Roxy Manns (TAVR)  CC: 1 month s/p TAVR   History of Present Illness:  Steven Maldonado is a 77 y.o. 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   The patient's cardiac history dates back to 2012 when he suffered an acute myocardial infarction. At the time he was treated at [facility] where catheterization reportedly revealed occluded right coronary artery with collaterals. Medical therapy was recommended. The patient developed paroxysmal atrial fibrillation and has been followed intermittently ever since on long-term anticoagulation using Pradaxa.He was noted to have a heart murmur on physical exam and echocardiograms revealed aortic stenosis that has gradually progressed in severity. For the last several years the patient has been followed by Dr. Dillard Essex management of aortic stenosis, coronary artery disease, and atrial fibrillation. He has been followed by Dr. Billey Gosling the [facility] clinic. Last spring he was noted to be back in atrial fibrillation and he was referred to the atrial fibrillation clinic. He was loaded with amiodarone he has been maintaining sinus rhythm since he underwent repeat cardioversion performed September 2018. He was seen in follow-up recently by Dr. Eden Lathe he reported slow progression of symptoms of exertional shortness of breath and fatigue despite the fact that he was maintaining sinus rhythm. Transthoracic echocardiogram performed June 16, 2017 suggested significant  progression in the severity of aortic stenosis. The patient was in atrial fibrillation at the time of the echocardiogram, and peak velocity across the aortic valve range between different length cardiac cycles. Peak velocity was reported 3.3 m/s corresponding to mean transvalvular gradient estimated 26 mmHg, but the DVI was quite low at 0.19. The aortic valve area calculated only 0.8 cm. Left ventricular function remained stable with ejection fraction estimated 50-55%. The patient subsequently underwent left and right heart catheterization on October 08, 2017 by Dr. Angelena Form.Catheterization revealed the presence of severe aortic stenosis with peak to peak and mean transvalvular gradients measured 44 and 41.8 mmHg, respectively, corresponding to aortic valve area calculated 0.79 cm. There was moderate 60% proximal stenosis of the left anterior descending coronary artery that was felt not to be flow limiting. There was 100% chronic occlusion of the right coronary artery. There was otherwise nonobstructive coronary artery disease.  He underwent successful TAVR with a 29 mm Edwards Sapien THV via the R TF approach on 12/02/17. Post operative echoshowed EF 55-60% with normally functioning TAVR valve, no PVL and mean gradient 22mm Hg. He was discharged on ASA and home pradaxa. He developed hemodynamically significant bradycardia and syncope post operatively and required PPM placement. Dr. Curt Bears implanted aMedtronic Azure XT DR MRI SureScan dual-chamber pacemaker(serial number [id number])on 12/04/17. He was in and out of atrial fib/flutter during his admission.  At his TOC visit, he was felt to be mildly volume overloaded and his lasix was increased for 3  days. Creat bumped slightly 1.55--> 1.9.   Today he presents to clinic for follow up. No CP or SOB. He has felt a lot better since increased lasix. Now back to his baseline. He can't walk as much as he would like because he is limited by  peripheral neuropathy. He wonders what he can take for that. He is able to walk back and forth 5-6 minutes with no issues. He can tell a big difference in his breathing since surgery. No LE edema, orthopnea or PND. No dizziness or syncope. No blood in stool or urine. No palpitations.    Past Medical History:  Diagnosis Date  . Chronic diastolic CHF (congestive heart failure) ([place])   . CKD (chronic kidney disease), stage III ([place])   . Coronary artery disease    a. Cath February 2012 in [place], occluded RCA with collaterals  . DM type 2 (diabetes mellitus, type 2) ([place])   . Essential hypertension   . Hyperlipidemia   . Pacemaker    a. symptomatic brady after TAVR s/p MDT PPM by Dr. Curt Bears 12/04/17  . Persistent atrial fibrillation ([place])   . PVD (peripheral vascular disease) ([place])    a. s/p R popliteal artery stenosis tx with drug-coated balloon 05/2014, followed by Dr. Fletcher Anon.  . 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 Severe aortic stenosis    a. 12/02/17: s/p TAVR  . Skin cancer   . Sleep apnea with use of continuous positive airway pressure (CPAP)    04-11-11 AHI was 32.9 and titrated to 15 cm H20, DME is [facility]  . Subclinical hypothyroidism     Past Surgical History:  Procedure Laterality Date  . ABDOMINAL ANGIOGRAM N/A 06/08/2014   Procedure: ABDOMINAL ANGIOGRAM;  Surgeon: Wellington Hampshire, MD;  Location: [facility] CATH LAB;  Service: Cardiovascular;  Laterality: N/A;  . APPENDECTOMY  1965  . CARDIAC CATHETERIZATION  12/2010  . CARDIOVERSION  07/2011  . CARDIOVERSION N/A 04/18/2014   Procedure: CARDIOVERSION;  Surgeon: Dorothy Spark, MD;  Location: [place];  Service: Cardiovascular;  Laterality: N/A;  . CARDIOVERSION N/A 11/03/2015   Procedure: CARDIOVERSION;  Surgeon: Lelon Perla, MD;  Location: [facility] ENDOSCOPY;  Service: Cardiovascular;  Laterality: N/A;    . CARDIOVERSION N/A 05/08/2017   Procedure: CARDIOVERSION;  Surgeon: Dorothy Spark, MD;  Location: [facility] ENDOSCOPY;  Service: Cardiovascular;  Laterality: N/A;  . CARDIOVERSION N/A 07/28/2017   Procedure: CARDIOVERSION;  Surgeon: Dorothy Spark, MD;  Location: [place];  Service: Cardiovascular;  Laterality: N/A;  . LOWER EXTREMITY ANGIOGRAM N/A 06/08/2014   Procedure: LOWER EXTREMITY ANGIOGRAM;  Surgeon: Wellington Hampshire, MD;  Location: [facility] CATH LAB;  Service: Cardiovascular;  Laterality: N/A;  . PACEMAKER IMPLANT N/A 12/04/2017   Procedure: PACEMAKER IMPLANT;  Surgeon: Constance Haw, MD;  Location: [place] CV LAB;  Service: Cardiovascular;  Laterality: N/A;  . POPLITEAL ARTERY ANGIOPLASTY Right 06/08/2014   Archie Endo 06/08/2014  . RIGHT/LEFT HEART CATH AND CORONARY ANGIOGRAPHY N/A 10/08/2017   Procedure: RIGHT/LEFT HEART CATH AND CORONARY ANGIOGRAPHY;  Surgeon: Burnell Blanks, MD;  Location: [place] CV LAB;  Service: Cardiovascular;  Laterality: N/A;  . SKIN CANCER EXCISION Bilateral    "have had them cut off back of neck X 2; off left upper arm; right wrist, near right shoulder blade" (06/08/2014)  . TEE WITHOUT CARDIOVERSION N/A 12/02/2017   Procedure: TRANSESOPHAGEAL ECHOCARDIOGRAM (  TEE);  Surgeon: Burnell Blanks, MD;  Location: [place];  Service: Open Heart Surgery;  Laterality: N/A;  . TEMPORARY PACEMAKER N/A 12/04/2017   Procedure: TEMPORARY PACEMAKER;  Surgeon: Leonie Man, MD;  Location: [place] CV LAB;  Service: Cardiovascular;  Laterality: N/A;  . TRANSCATHETER AORTIC VALVE REPLACEMENT, TRANSFEMORAL N/A 12/02/2017   Procedure: TRANSCATHETER AORTIC VALVE REPLACEMENT, TRANSFEMORAL;  Surgeon: Burnell Blanks, MD;  Location: [place];  Service: Open Heart Surgery;  Laterality: N/A;  using Edwards Sapien 3 Transcatheter Heart Valve size 28mm    Current Medications: Outpatient Medications Prior to Visit  Medication Sig Dispense Refill  .  amiodarone (PACERONE) 200 MG tablet Take 1 tablet (200 mg total) by mouth daily. 90 tablet 3  . aspirin EC 81 MG tablet Take 81 mg by mouth daily.    [name] Kitchen atorvastatin (LIPITOR) 80 MG tablet TAKE 1 TABLET EVERY DAY AT BEDTIME 90 tablet 0  . BD PEN NEEDLE NANO U/F 32G X 4 MM MISC USE 4 (FOUR) TIMES DAILY. 400 each 1  . Blood Glucose Monitoring Suppl (FREESTYLE LITE) DEVI 1 each by Does not apply route 2 (two) times daily. 1 each 0  . Choline Fenofibrate 135 MG capsule TAKE 1 CAPSULE DAILY 90 capsule 3  . CINNAMON PO Take 1 tablet by mouth 2 (two) times daily.    . diphenhydramine-acetaminophen (TYLENOL PM) 25-500 MG TABS tablet Take 1 tablet by mouth at bedtime as needed (sleep).    . docusate sodium (COLACE) 100 MG capsule Take 100 mg daily by mouth.     . fesoterodine (TOVIAZ) 4 MG TB24 tablet Take 4 mg by mouth daily.    . furosemide (LASIX) 20 MG tablet Take 1 tablet (20 mg total) by mouth daily. 90 tablet 3  . glipiZIDE (GLUCOTROL XL) 10 MG 24 hr tablet TAKE 1 TABLET (10 MG TOTAL) BY MOUTH DAILY WITH BREAKFAST. 30 tablet 5  . glucose blood (FREESTYLE LITE) test strip 1 each by Other route 4 (four) times daily -  before meals and at bedtime. 450 each 3  . Insulin Glargine (BASAGLAR KWIKPEN) 100 UNIT/ML SOPN Inject 0.65 mLs (65 Units total) into the skin at bedtime. 30 mL 0  . insulin lispro (HUMALOG) 100 UNIT/ML KiwkPen Inject 0.05 mLs (5 Units total) into the skin 3 (three) times daily. 15 mL 3  . levothyroxine (SYNTHROID) 25 MCG tablet Take 3 tablets by mouth before breakfast in the am for 1 week. Then increase to 4 tablets by mouth daily before breakfast in the am. 88 tablet 0  . magnesium oxide (MAG-OX) 400 MG tablet TAKE 1 TABLET (400 MG TOTAL) BY MOUTH DAILY. 30 tablet 11  . metoprolol tartrate (LOPRESSOR) 50 MG tablet Take one and one-half tablet by mouth twice a day (Patient taking differently: Take 75 mg by mouth 2 (two) times daily. ) 270 tablet 3  . MYRBETRIQ 25 MG TB24 tablet TAKE 1  TABLET BY MOUTH EVERY DAY 90 tablet 0  . PRADAXA 150 MG CAPS capsule TAKE 1 CAPSULE EVERY 12    HOURS 180 capsule 3   No facility-administered medications prior to visit.      Allergies:   Patient has no known allergies.   Social History   Socioeconomic History  . Marital status: Married    Spouse name: [name]  . Number of children: 1  . Years of education: 8  . Highest education level: None  Social Needs  . Financial resource strain: None  . Food  insecurity - worry: None  . Food insecurity - inability: None  . Transportation needs - medical: None  . Transportation needs - non-medical: None  Occupational History  . Occupation: retired    Fish farm manager: [employer]  Tobacco Use  . Smoking status: Former Smoker    Packs/day: 2.00    Years: 14.00    Pack years: 28.00    Types: Cigarettes    Last attempt to quit: 11/11/1972    Years since quitting: 45.1  . Smokeless tobacco: Never Used  Substance and Sexual Activity  . Alcohol use: No    Comment: quit in 1984  . Drug use: No  . Sexual activity: Not Currently  Other Topics Concern  . None  Social History Narrative   Patient is married Engineer, drilling) and lives at home with his wife.   Patient has one child and his wife has one child.   Patient is retired.   Patient has a high school education.   Patient is right-handed.   Patient drinks very little caffeine.     Family History:  The patient's family history includes Diabetes in his brother, daughter, father, mother, other, and sister; Heart Problems in his daughter; Heart attack in his father and mother; Heart failure in his father; Hypertension in his brother, father, mother, and sister; Stroke in his brother.      ROS:   Please see the history of present illness.    ROS All other systems reviewed and are negative.   PHYSICAL EXAM:   VS:  BP (!) 128/56   Pulse 60   Ht 5\' 11"  (1.803 m)   Wt 251 lb 6.4 oz (114 kg)   BMI 35.06 kg/m    GEN: Well nourished, well  developed, in no acute distress, obese HEENT: normal  Neck: no JVD, carotid bruits, or masses Cardiac: RRR; no murmurs, rubs, or gallops,no edema  Respiratory:  clear to auscultation bilaterally, normal work of breathing GI: soft, nontender, nondistended, + BS MS: no deformity or atrophy  Skin: warm and dry, no rash Neuro:  Alert and Oriented x 3, Strength and sensation are intact Psych: euthymic mood, full affect   Wt Readings from Last 3 Encounters:  12/24/17 251 lb 6.4 oz (114 kg)  12/15/17 256 lb 9.6 oz (116.4 kg)  12/11/17 262 lb (118.8 kg)      Studies/Labs Reviewed:   EKG:  EKG is NOT ordered today.    Recent Labs: 11/28/2017: B Natriuretic Peptide 47.9 12/02/2017: ALT 23 12/03/2017: Magnesium 1.9 12/05/2017: Hemoglobin 11.9; Platelets 95 12/11/2017: NT-Pro BNP 271 12/15/2017: TSH 53.16 12/24/2017: BUN 20; Creatinine, Ser 1.43; Potassium 4.5; Sodium 139   Lipid Panel    Component Value Date/Time   CHOL 155 10/13/2017 1224   TRIG 144 10/13/2017 1224   HDL 38 (L) 10/13/2017 1224   CHOLHDL 4.1 10/13/2017 1224   VLDL 32 (H) 05/01/2017 1216   [place] 95 05/01/2017 1216    Additional studies/ records that were reviewed today include:  TAVR OPERATIVE NOTE   Date of Procedure:12/02/2017 Procedure:   Transcatheter Aortic Valve Replacement - PercutaneousRightTransfemoral Approach Edwards Sapien 3 THV (size 36mm, model # 9600TFX, serial # [id number])  Co-Surgeons:Christopher Angelena Form, MD andClarence H. Roxy Manns, MD   Pre-operative Echo Findings: ? Severe aortic stenosis ? Normalleft ventricular systolic function  Post-operative Echo Findings: ? Noparavalvular leak ? Normalleft ventricular systolic function   _______________  Post operative echo 12/03/17 Study Conclusions - (current admission, 12/02/2017). Edwards Sapien 3 THV 29 mm. - Left ventricle:  The cavity size was normal. Systolic function  was normal. The estimated ejection fraction was in the range of 55% to 60%. There is akinesis of the inferoseptal myocardium. Doppler parameters are consistent with abnormal left ventricular relaxation (grade 1 diastolic dysfunction). Doppler parameters are consistent with high ventricular filling pressure. - Aortic valve: A bioprosthesis was present. Peak velocity (S): 129 cm/s. Peak gradient (S): 7 mm Hg. Valve area (VTI): 2.42 cm^2. Valve area (Vmax): 2.6 cm^2. Valve area (Vmean): 2.43 cm^2. - Mitral valve: Calcified annulus. There was mild regurgitation. Complications: Temporary Pacemaker still in, patient unable to turn onto his left side.   _________________  2D ECHO 12/24/17 (1 month s/p TAVR) Study Conclusions - Left ventricle: Inferobasal hypokinesis The cavity size was   normal. Wall thickness was increased in a pattern of severe LVH.   Systolic function was normal. The estimated ejection fraction was   in the range of 55% to 60%. - Aortic valve: Post TAVR with no significant peri valvualr   regurgitation gradients have increased since 12/03/17. - Mitral valve: Severely calcified annulus. Moderately thickened,   moderately calcified leaflets . There was mild regurgitation. - Left atrium: The atrium was moderately dilated. - Atrial septum: No defect or patent foramen ovale was identified.    ASSESSMENT & PLAN:   Severe AS s/p TAVR: he is doing excellent s/p TAVR. He can tell a huge difference in his breathing since surgery. He NHYA class I symptoms. He would like to be more active but is limited by peripheral neuropathy. 2D ECHO today shows normally functioning TAVR with no PVL and mean gradient 10 mm Hg (increased from post op echo, but still in an acceptable range). He is aware of the need for SBE prophylaxis. Continue pradaxa and ASA. He can stop ASA after 6 months of therapy (mid July)  SymptomaticBradycardias/p PPM: continue follow up with Dr.  Curt Bears   Chronic diastolic CHF: his weight is down 5 lbs after increased lasix and he is feeling much better. Appears euvolemic. Continue lasix 20mg  daily. Check BMET today  HTN: BP well controlled today   PAF: sounds regular on exam. Continue amio and pradaxa  CKD: creat 1.9 last check. Will repeat BMET today  DMT2:continue current regimen  OSA: continue CPAP  CAD: continue medical therapy   Carotid artery disease: pre TAVR dopplers showed 25-42% stenosis LICA stenosis. I will have repeat carotids done in 6 months.   Peripheral neuropathy: he would like to be more active but is limited by peripheral neuropathy. He would like to know what he can do to help this. I encouraged him to talk to his PCP about possible medications like gabapentin. I will forward my note to her.   Medication Adjustments/Labs and Tests Ordered: Current medicines are reviewed at length with the patient today.  Concerns regarding medicines are outlined above.  Medication changes, Labs and Tests ordered today are listed in the Patient Instructions below. Patient Instructions  Medication Instructions:  1) STOP ASPIRIN June 01, 2018. You will be called as a reminder.  Labwork: TODAY: BMET  Testing/Procedures: Your physician has requested that you have a carotid duplex in June, 2019. This test is an ultrasound of the carotid arteries in your neck. It looks at blood flow through these arteries that supply the brain with blood. Allow one hour for this exam. There are no restrictions or special instructions.   Your provider has requested that you have an echocardiogram in 1 year on the same day you see  Nell Range. You will be called to arrange this appointment when Katie's schedule opens. Echocardiography is a painless test that uses sound waves to create images of your heart. It provides your doctor with information about the size and shape of your heart and how well your heart's chambers and valves are  working. This procedure takes approximately one hour. There are no restrictions for this procedure.  Follow-Up: Your provider recommends that you schedule a follow-up appointment in 4 MONTHS with Dr. Angelena Form.  Your provider wants you to follow-up in: 1 year with Nell Range, PA. You will receive a reminder letter in the mail two months in advance. If you don't receive a letter, please call our office to schedule the follow-up appointment.    Any Other Special Instructions Will Be Listed Below (If Applicable). Katie sent a message to Dena Billet about your neuropathy medication. She recommends Gabapentin (Neurontin).   If you need a refill on your cardiac medications before your next appointment, please call your pharmacy.      Mable Fill, [name]  12/25/2017 10:43 AM    [facility] [place], [place], [place]  [address] Phone: [phone]; Fax: [phone]

## 2017-12-25 ENCOUNTER — Telehealth: Payer: Self-pay

## 2017-12-25 LAB — BASIC METABOLIC PANEL
BUN/Creatinine Ratio: 14 (ref 10–24)
BUN: 20 mg/dL (ref 8–27)
CO2: 22 mmol/L (ref 20–29)
Calcium: 9.9 mg/dL (ref 8.6–10.2)
Chloride: 101 mmol/L (ref 96–106)
Creatinine, Ser: 1.43 mg/dL — ABNORMAL HIGH (ref 0.76–1.27)
GFR calc Af Amer: 55 mL/min/{1.73_m2} — ABNORMAL LOW (ref >59)
GFR calc non Af Amer: 48 mL/min/{1.73_m2} — ABNORMAL LOW (ref >59)
Glucose: 211 mg/dL — ABNORMAL HIGH (ref 65–99)
Potassium: 4.5 mmol/L (ref 3.5–5.2)
Sodium: 139 mmol/L (ref 134–144)

## 2017-12-25 MED ORDER — GABAPENTIN 300 MG PO CAPS: 300.0000 mg | ORAL_CAPSULE | Freq: Three times a day (TID) | ORAL | 5 refills | Status: DC

## 2017-12-25 NOTE — Telephone Encounter (Addendum)
-----   Message from [name], [name] sent at 12/25/2017  2:57 PM EST -----   ----- Message ----- From: Eileen Stanford, [name] Sent: 12/25/2017  10:09 AM To: [name], [name]   Call and inform him that cardiology let me know about his peripheral neuropathy.  Please add to problem list peripheral neuropathy.  Add medication gabapentin/Neurontin 300 mg 1 p.o. 3 times daily #90+5 refills.

## 2017-12-25 NOTE — Progress Notes (Signed)
Call and inform him that cardiology let me know about his peripheral neuropathy.  Please add to problem list peripheral neuropathy.  Add medication gabapentin/Neurontin 300 mg 1 p.o. 3 times daily #90+5 refills.

## 2017-12-25 NOTE — Telephone Encounter (Signed)
Left message for patient that corrected AVS is being mailed to his house.  AVS includes corrected ASA stop date and recommendation for PCP to start gabapentin. Instructed him to call with questions or concerns.

## 2017-12-25 NOTE — Telephone Encounter (Signed)
Call placed to patient he is aware of new medication

## 2017-12-29 ENCOUNTER — Telehealth (HOSPITAL_COMMUNITY): Payer: Self-pay

## 2017-12-29 NOTE — Telephone Encounter (Signed)
Attempted to call patient in regards to Cardiac Rehab - lm on vm °

## 2018-01-12 ENCOUNTER — Other Ambulatory Visit: Payer: Self-pay

## 2018-01-12 ENCOUNTER — Encounter: Payer: Self-pay | Admitting: Physician Assistant

## 2018-01-12 ENCOUNTER — Ambulatory Visit (INDEPENDENT_AMBULATORY_CARE_PROVIDER_SITE_OTHER): Payer: Medicare Other | Admitting: Physician Assistant

## 2018-01-12 VITALS — BP 118/60 | HR 78 | Temp 97.7°F | Resp 16 | Ht 71.0 in | Wt 251.8 lb

## 2018-01-12 DIAGNOSIS — I10 Essential (primary) hypertension: Secondary | ICD-10-CM

## 2018-01-12 DIAGNOSIS — E785 Hyperlipidemia, unspecified: Secondary | ICD-10-CM | POA: Diagnosis not present

## 2018-01-12 DIAGNOSIS — Z794 Long term (current) use of insulin: Secondary | ICD-10-CM | POA: Diagnosis not present

## 2018-01-12 DIAGNOSIS — I739 Peripheral vascular disease, unspecified: Secondary | ICD-10-CM

## 2018-01-12 DIAGNOSIS — I2510 Atherosclerotic heart disease of native coronary artery without angina pectoris: Secondary | ICD-10-CM

## 2018-01-12 DIAGNOSIS — Z9989 Dependence on other enabling machines and devices: Secondary | ICD-10-CM | POA: Diagnosis not present

## 2018-01-12 DIAGNOSIS — E119 Type 2 diabetes mellitus without complications: Secondary | ICD-10-CM | POA: Diagnosis not present

## 2018-01-12 DIAGNOSIS — I6523 Occlusion and stenosis of bilateral carotid arteries: Secondary | ICD-10-CM | POA: Diagnosis not present

## 2018-01-12 DIAGNOSIS — G4733 Obstructive sleep apnea (adult) (pediatric): Secondary | ICD-10-CM | POA: Diagnosis not present

## 2018-01-12 DIAGNOSIS — E039 Hypothyroidism, unspecified: Secondary | ICD-10-CM

## 2018-01-12 MED ORDER — LEVOTHYROXINE SODIUM 25 MCG PO TABS: ORAL_TABLET | ORAL | 1 refills | Status: DC

## 2018-01-12 NOTE — Progress Notes (Signed)
Patient ID: Steven Maldonado MRN: [id number], DOB: Apr 27, 1941, 77 y.o. Date of Encounter: 01/12/2018, 9:16 AM    Chief Complaint:  Chief Complaint  Patient presents with  . 3 month follow up     HPI: 77 y.o. year old male here to f/u high Blood Sugar, Uncontrolled Diabetes.   08/21/2016: At his last routine office visit his A1c result came back 11.4.  This was on 08/21/2016.  At that time I told him to increase his Basaglar to 60 units. Also told him to document fasting morning blood sugar readings on a log form and bring this with him to office visit with me in one week.  Today he reports that he did increase the Basaglar to the 60 units.  Today he does bring in blood sugar log sheet for me to review. Fasting morning blood sugar readings have been: 172 179  160  167  140  150 135 129  States that his wife is doing weight watchers and has lost 10 pounds. He asked me if it would be okay for him to eat the same thing she is eating and follow that. Says that would be easier than eating 2 separate meals. However I remember him discussing this with me at the last office visit and I had already encouraged him at that time to eat what she was eating and follow the Weight Watchers diet. Today he also makes mention that he has seen 4 different dietitians in the past and "knows what he is supposed to eat-- it's just a matter of doing it."   today I also discussed with him.snacking and whether he even if he eats meals with her whether he is going to be sneaking other foods in addition.   he states that they have gotten rid of all snack foods and that there are no snack foods in the house. Asked him if he will drive to the store and get some for himself but he says he will not.   asked him if he thinks he needs to have a follow-up visit with me just in a couple weeks to make sure that he is being compliant. He says that he will be compliant and that he needs to do this for him, not for  me. I agreed but just wanted to make sure that he will actually stick with this.  He is going to continue the Basaglar at the current dose of 60 units. He is going to continue to document blood sugars and will call me if he is getting higher readings than where they are.   05/01/2017: Today he reports that he is sorry that he has not been here for his routine visit and knows that he is overdue for visit with me.  Says that he ended up staying in [place] for 4 months house sitting for his nephew. Says that he has one other thing he wanted to make sure to let me now--says that he is in A. fib and HEENT Arty knows that--- says that he recently saw cardiology and their EKG showed that he was in A. Fib. Again today he tells me that he plans to make diet changes. He acts as if he does not even remember the conversation that we had at his last office visit and all that is documented in the note above--today states that he wants to live to be a grandfather that he wants to take better care of himself and that he has gone  to lots of nutritionist and his wife has been doing weight watchers and he knows what to do and says that he is going to start doing it---!!!!!! States that he is taking the insulin as directed. He is also taking metoprolol and Lasix as directed. Having no lightheadedness. Taking Lipitor as directed. No myalgias or right upper quadrant pain.   10/13/2017: Today I reviewed in the computer that he had a cardioversions listed for 05/08/17 and 07/28/17.  Also see that he had left and right heart cath listed for 10/08/17 and documentation of severe AS.  Today in patient's words he states that he has had a lot of doctors appointments. Says he "was supposed to have cataract surgery but cardiology canceled that."   Says that he has 2 more doctor's appointments and then is supposed to have surgery to replace the valve.  Says that he was glad to find out that he does not have to have open heart  surgery that they can go through his groin.  Says that the surgeon "said that he could give him 10-15 years to live by giving him this valve."  Says to me "if he can do that for me, then I can make these diet changes that you all have been wanting me to do ".  Says that cardiology had wanted him to see endocrinology.  Patient says to me today that he would prefer to just see me instead of seeing another new doctor.  Says that he thinks "if he did what I told him to do, that I could take care of his diabetes just as well as anybody. " Says "knows he has to do what he is told or it does not matter who he sees"  Also says that he has his wife and his grandson and he wants to live for them.  Says that he has gone to 5 dietitians and does not think that there is anything that anybody can tell him that he has not heard before.  Says that it was after he met with the surgeon that he had this change of mind and change of attitude.  That visit was on 09/26/17.  States that right then is when he went home and made changes in his diet.  States that his recent morning blood sugars have been 109, 106, 92.  He has no other specific concerns to address today. He reports that his "fluid pill" (lasix) was held for a couple days around the time of the cath but otherwise has been taking metoprolol and Lasix as directed. Taking his Lipitor as directed.  No adverse effects.    12/15/2017:  presents follow-up after recent hospitalization.  Today I have reviewed his hospital discharge summary. Hospitalized 12/02/2017 -  12/05/2017.  Was hospitalized to undergo TAVR for management of severe AS.  He developed hemodynamically significant bradycardia status post TAVR and required permanent pacemaker implantation.  Otherwise had an uncomplicated hospital course and is recovering well.  I reviewed his follow-up office visit at cardiology 12/11/2017.  At that visit it was noted that his weight was up despite that he not been  eating more than usual.  Also he had some mild LE edema and abdominal distention.  He was instructed to increase Lasix from 20 mg daily to 40 mg daily for 3 days.  Labs were checked and would be rechecked at his next visit in 1 week.  Today I reviewed that his weight at the cardiology visit was 262 pounds.  Weight here today 256 pounds. Patient reports that he has been taking the increased dose of Lasix as directed.  States that he can tell that the tightness and swelling in his lower legs is improved.  States that he does have follow-up with Cardiology 1 week.  Reviewed that he had A1c at the hospital 11/28/17 and A1c was much improved and down to 8.1 !  Today I congratulated him on his dietary compliance!!  Today I reviewed that at lab 10/13/17 TSH was high and T4 was low and we had him start thyroid 25 mcg daily.  Today he reports that he has been taking this daily as directed.  He has had no follow-up lab to monitor this and is due to follow this up today.  Otherwise he has no specific concerns to address today and states that he is feeling good.  AT THAT OV--CHECKED TSH  TSH came back elevated at 53.16. Notes recorded by [name], [name] on 12/16/2017 at 11:42 AM EST See Result note from 10/13/2017 and med note attached to his thyroid medication-- ---10/14/2017---started 39mg daily for 3 weeks then was to increase to taking 2 daily----VERIFY with pt that he took as directed. If he is NOT--Let me know. If he is taking as directed--then--increase to taking 3 daily for 1 week then increase to taking 4 daily. Recheck TSH 4 weeks.   01/12/2018: Today he reports that he has been taking the thyroid medication exactly as directed and the instructions documented above. He has no other specific concerns to address today.     Home Meds:   Outpatient Medications Prior to Visit  Medication Sig Dispense Refill  . amiodarone (PACERONE) 200 MG tablet Take 1 tablet (200 mg total) by mouth  daily. 90 tablet 3  . aspirin EC 81 MG tablet Take 81 mg by mouth daily.    .[name] Kitchenatorvastatin (LIPITOR) 80 MG tablet TAKE 1 TABLET EVERY DAY AT BEDTIME 90 tablet 0  . BD PEN NEEDLE NANO U/F 32G X 4 MM MISC USE 4 (FOUR) TIMES DAILY. 400 each 1  . Blood Glucose Monitoring Suppl (FREESTYLE LITE) DEVI 1 each by Does not apply route 2 (two) times daily. 1 each 0  . Choline Fenofibrate 135 MG capsule TAKE 1 CAPSULE DAILY 90 capsule 3  . CINNAMON PO Take 1 tablet by mouth 2 (two) times daily.    . diphenhydramine-acetaminophen (TYLENOL PM) 25-500 MG TABS tablet Take 1 tablet by mouth at bedtime as needed (sleep).    . docusate sodium (COLACE) 100 MG capsule Take 100 mg daily by mouth.     . fesoterodine (TOVIAZ) 4 MG TB24 tablet Take 4 mg by mouth daily.    . furosemide (LASIX) 20 MG tablet Take 1 tablet (20 mg total) by mouth daily. 90 tablet 3  . gabapentin (NEURONTIN) 300 MG capsule Take 1 capsule (300 mg total) by mouth 3 (three) times daily. 90 capsule 5  . glipiZIDE (GLUCOTROL XL) 10 MG 24 hr tablet TAKE 1 TABLET (10 MG TOTAL) BY MOUTH DAILY WITH BREAKFAST. 30 tablet 5  . glucose blood (FREESTYLE LITE) test strip 1 each by Other route 4 (four) times daily -  before meals and at bedtime. 450 each 3  . Insulin Glargine (BASAGLAR KWIKPEN) 100 UNIT/ML SOPN Inject 0.65 mLs (65 Units total) into the skin at bedtime. 30 mL 0  . insulin lispro (HUMALOG) 100 UNIT/ML KiwkPen Inject 0.05 mLs (5 Units total) into the skin 3 (three) times daily. 15  mL 3  . levothyroxine (SYNTHROID) 25 MCG tablet Take 3 tablets by mouth before breakfast in the am for 1 week. Then increase to 4 tablets by mouth daily before breakfast in the am. 88 tablet 0  . magnesium oxide (MAG-OX) 400 MG tablet TAKE 1 TABLET (400 MG TOTAL) BY MOUTH DAILY. 30 tablet 11  . metoprolol tartrate (LOPRESSOR) 50 MG tablet Take one and one-half tablet by mouth twice a day (Patient taking differently: Take 75 mg by mouth 2 (two) times daily. ) 270 tablet 3   . MYRBETRIQ 25 MG TB24 tablet TAKE 1 TABLET BY MOUTH EVERY DAY 90 tablet 0  . PRADAXA 150 MG CAPS capsule TAKE 1 CAPSULE EVERY 12    HOURS 180 capsule 3   No facility-administered medications prior to visit.     Allergies: No Known Allergies    Review of Systems: See HPI for pertinent ROS. All other ROS negative.    Physical Exam: Blood pressure 118/60, pulse 78, temperature 97.7 F (36.5 C), temperature source Oral, resp. rate 16, height 5' 11" (1.803 m), weight 114.2 kg (251 lb 12.8 oz), SpO2 98 %., Body mass index is 35.12 kg/m. General:  Obese WM. Appears in no acute distress. Neck: Supple. No thyromegaly. No lymphadenopathy. Lungs: Clear bilaterally to auscultation without wheezes, rales, or rhonchi. Breathing is unlabored. Heart: Regular rhythm. III/VI murmur. Abdomen: Soft, non-tender, non-distended with normoactive bowel sounds. No hepatomegaly. No rebound/guarding. No obvious abdominal masses. Msk:  Strength and tone normal for age. Extremities/Skin: Warm and dry. Neuro: Alert and oriented X 3. Moves all extremities spontaneously. Gait is normal. CNII-XII grossly in tact. Psych:  Responds to questions appropriately with a normal affect.     ASSESSMENT AND PLAN:  77 y.o. year old male with   At office visit 01/12/2018 I am only addressing his hypothyroidism. He has been taking thyroid medication as directed since last lab check-- see that result note ---which is documented in HPI above. Check TSH now on adjusted dose. Hypothyroidism, unspecified type - TSH   The following are some of the other diagnoses which I will follow-up at future visits but were not addressed at visit 01/12/2018:   S/P TAVR (transcatheter aortic valve replacement) Follow-up with cardiology and CVT S.    Symptomatic bradycardia S/ post pacemaker Pacemaker Per Cardiology Type 2 diabetes mellitus without complication, with long-term current use of insulin (HCC)  A1C improved and he is  continuing compliance with diet as well as meds.    Signed, [address] [place], [place], [facility] 01/12/2018 9:16 AM

## 2018-01-13 LAB — TSH: TSH: 20.91 mIU/L — ABNORMAL HIGH (ref 0.40–4.50)

## 2018-01-14 ENCOUNTER — Telehealth (HOSPITAL_COMMUNITY): Payer: Self-pay

## 2018-01-14 ENCOUNTER — Encounter (HOSPITAL_COMMUNITY): Payer: Self-pay

## 2018-01-14 NOTE — Telephone Encounter (Signed)
2nd attempt to call patient in regards to Cardiac Rehab - lm on vm. Sending letter. °

## 2018-01-21 ENCOUNTER — Telehealth (HOSPITAL_COMMUNITY): Payer: Self-pay

## 2018-01-21 NOTE — Telephone Encounter (Signed)
Called and spoke with patient in regards to Cardiac Rehab - He stated he is not interested at the moment and would like for me to give him a call in a couple of weeks. Will follow up the first week of April.

## 2018-01-28 ENCOUNTER — Telehealth: Payer: Self-pay | Admitting: Cardiovascular Disease

## 2018-01-28 ENCOUNTER — Telehealth (HOSPITAL_COMMUNITY): Payer: Self-pay

## 2018-01-28 DIAGNOSIS — R531 Weakness: Secondary | ICD-10-CM | POA: Diagnosis not present

## 2018-01-28 DIAGNOSIS — R404 Transient alteration of awareness: Secondary | ICD-10-CM | POA: Diagnosis not present

## 2018-01-28 NOTE — Telephone Encounter (Signed)
error 

## 2018-01-28 NOTE — Telephone Encounter (Signed)
Wife of patient called on behalf of patient in regards to CR - Scheduled orientation on 03/24/2018 at 1:30pm. Patient will attend the 2:45pm exc class. Went over insurance with wife and wife verbally stated she understands what patient is responsible for. Mailed packet.

## 2018-01-29 ENCOUNTER — Encounter: Payer: Self-pay | Admitting: Thoracic Surgery (Cardiothoracic Vascular Surgery)

## 2018-01-31 ENCOUNTER — Other Ambulatory Visit: Payer: Self-pay | Admitting: Physician Assistant

## 2018-02-02 ENCOUNTER — Encounter: Payer: Self-pay | Admitting: Neurology

## 2018-02-02 NOTE — Telephone Encounter (Signed)
Refill appropriate 

## 2018-02-03 ENCOUNTER — Ambulatory Visit (INDEPENDENT_AMBULATORY_CARE_PROVIDER_SITE_OTHER): Payer: Medicare Other | Admitting: Neurology

## 2018-02-03 ENCOUNTER — Encounter: Payer: Self-pay | Admitting: Neurology

## 2018-02-03 ENCOUNTER — Other Ambulatory Visit: Payer: Self-pay

## 2018-02-03 VITALS — BP 142/72 | HR 70 | Ht 71.0 in | Wt 254.0 lb

## 2018-02-03 DIAGNOSIS — Z952 Presence of prosthetic heart valve: Secondary | ICD-10-CM

## 2018-02-03 DIAGNOSIS — G4733 Obstructive sleep apnea (adult) (pediatric): Secondary | ICD-10-CM | POA: Diagnosis not present

## 2018-02-03 DIAGNOSIS — Z9989 Dependence on other enabling machines and devices: Secondary | ICD-10-CM

## 2018-02-03 DIAGNOSIS — I350 Nonrheumatic aortic (valve) stenosis: Secondary | ICD-10-CM

## 2018-02-03 DIAGNOSIS — H6123 Impacted cerumen, bilateral: Secondary | ICD-10-CM | POA: Diagnosis not present

## 2018-02-03 DIAGNOSIS — I2510 Atherosclerotic heart disease of native coronary artery without angina pectoris: Secondary | ICD-10-CM | POA: Diagnosis not present

## 2018-02-03 DIAGNOSIS — I6523 Occlusion and stenosis of bilateral carotid arteries: Secondary | ICD-10-CM | POA: Diagnosis not present

## 2018-02-03 DIAGNOSIS — I739 Peripheral vascular disease, unspecified: Secondary | ICD-10-CM | POA: Diagnosis not present

## 2018-02-03 DIAGNOSIS — N182 Chronic kidney disease, stage 2 (mild): Secondary | ICD-10-CM

## 2018-02-03 MED ORDER — LEVOTHYROXINE SODIUM 150 MCG PO TABS: 150.0000 ug | ORAL_TABLET | Freq: Every day | ORAL | 0 refills | Status: DC

## 2018-02-03 NOTE — Progress Notes (Signed)
PATIENT: Steven Maldonado DOB: October 18, 1941  REASON FOR VISIT: follow up- OSA on CPAP HISTORY FROM: patient  HISTORY OF PRESENT ILLNESS:  Machine is over 75 years old. 02-03-2018 .  I have the pleasure of seeing Steven Maldonado today, a 77 year old Caucasian right-handed gentleman who has a long-standing history of obstructive sleep apnea and has always been compliant on CPAP.  Steven Maldonado had severe cardiac disease, and had been admitted for a surgery he had an aortic valve replacement. He also now has a pacemaker.  He continues to be highly compliant with CPAP use the machine 28 out of the last 30 days, with 6 hours and 23 minutes average use, set pressure is 14 cmH2O with 3 cm expiratory pressure relief, residual AHI is only 2.2.  He does have  high air leaks on a FFM , he is due to receive a new machine. He snored very loud on nasal pillow. He likes to be refitted for an interface. [facility] - he wants a cleaning machine .   Steven Maldonado is a 77 year old male with a history of obstructive sleep apnea on CPAP. He returns today for follow-up. He is currently using the CPAP 25 out of 30 days for compliance of 83%. He uses his machine greater than 4 hours 23 out of 30 days for compliance of 77%. On average he uses his machine 7 hours and 17 minutes. He is on a pressure of 14 cm water with EPR 3. His residual AHI is 1.8. He does have a significant leak in the 95th percentile at 49.3 L/m. He states that he is only able to change his straps every 6 months and they do get loose quickly. He also states that he does know that he pushes his mask off his face at night as his wife has witnessed him do this. He states that he is unaware that he does this. He does note that he does feel air leaking around the mask when he puts it on. He returns today for an evaluation.  HISTORY Steven Maldonado is a 77 year old male with a history of obstructive sleep apnea on CPAP. He returns today for a compliance download. His  download indicates that he uses his machine 27 out of 30 days for compliance of 90%. He uses his machine greater than 4 hours 23 out of 30 days for compliance of 77%. On average he uses his machine 7 hours and 36 minutes. His residual AHI is 2.2 on 14 cm of water with EPR of 3. The patient does have a significant leak. His leak in the 95th percentile is 110.0 L/m. The patient does confirm that his mask leaks during the night. He reports that his wife also complains of hearing the mask leak. The patient's Epworth sleepiness score is 3 was previously 2. Overall he does feel that the CPAP is beneficial. He tries to use the CPAP nightly however if he is  out of town for one night he typically does not take his machine with him. He typically goes to bed around midnight and will get up around 8 AM. He returns today for an evaluation.   REVIEW OF SYSTEMS: Out of a complete 14 system review of symptoms, the patient complains only of the following symptoms, and all other reviewed systems are negative.  See history of present illness, Epworth sleepiness score 5 fatigue severity score 10   Date of Procedure:12/02/2017 Procedure:   Transcatheter Aortic Valve Replacement - PercutaneousRightTransfemoral Approach Steven Maldonado  Sapien 3 THV (size 61mm, model # 9600TFX, serial # [id number])  Co-Surgeons:Christopher Angelena Form, MD andClarence H. Roxy Manns, MD   Pre-operative Echo Findings: ? Severe aortic stenosis ? Normalleft ventricular systolic function  Post-operative Echo Findings: ? Noparavalvular leak ? Normalleft ventricular systolic function   _______________  Post operative echo 12/03/17 Study Conclusions - (current admission, 12/02/2017). Edwards Sapien 3 THV 29 mm. - Left ventricle: The cavity size was normal. Systolic function was normal. The estimated ejection fraction was in the range of 55% to 60%. There is akinesis of the  inferoseptal myocardium. Doppler parameters are consistent with abnormal left ventricular relaxation (grade 1 diastolic dysfunction). Doppler parameters are consistent with high ventricular filling pressure. - Aortic valve: A bioprosthesis was present. Peak velocity (S): 129 cm/s. Peak gradient (S): 7 mm Hg. Valve area (VTI): 2.42 cm^2. Valve area (Vmax): 2.6 cm^2. Valve area (Vmean): 2.43 cm^2. - Mitral valve: Calcified annulus. There was mild regurgitation. Complications: Temporary Pacemaker still in, patient unable to turn onto his left side.   ALLERGIES: No Known Allergies  HOME MEDICATIONS: Outpatient Medications Prior to Visit  Medication Sig Dispense Refill  . amiodarone (PACERONE) 200 MG tablet Take 1 tablet (200 mg total) by mouth daily. 90 tablet 3  . aspirin EC 81 MG tablet Take 81 mg by mouth daily.    [name] Kitchen atorvastatin (LIPITOR) 80 MG tablet TAKE 1 TABLET EVERY DAY AT BEDTIME 90 tablet 0  . BD PEN NEEDLE NANO U/F 32G X 4 MM MISC USE 4 (FOUR) TIMES DAILY. 400 each 1  . Blood Glucose Monitoring Suppl (FREESTYLE LITE) DEVI 1 each by Does not apply route 2 (two) times daily. 1 each 0  . Choline Fenofibrate 135 MG capsule TAKE 1 CAPSULE DAILY 90 capsule 3  . CINNAMON PO Take 1 tablet by mouth 2 (two) times daily.    . diphenhydramine-acetaminophen (TYLENOL PM) 25-500 MG TABS tablet Take 1 tablet by mouth at bedtime as needed (sleep).    . docusate sodium (COLACE) 100 MG capsule Take 100 mg daily by mouth.     . fesoterodine (TOVIAZ) 4 MG TB24 tablet Take 4 mg by mouth daily.    . furosemide (LASIX) 20 MG tablet Take 1 tablet (20 mg total) by mouth daily. 90 tablet 3  . gabapentin (NEURONTIN) 300 MG capsule Take 1 capsule (300 mg total) by mouth 3 (three) times daily. 90 capsule 5  . glipiZIDE (GLUCOTROL XL) 10 MG 24 hr tablet TAKE 1 TABLET (10 MG TOTAL) BY MOUTH DAILY WITH BREAKFAST. 30 tablet 5  . glucose blood (FREESTYLE LITE) test strip 1 each by Other route 4  (four) times daily -  before meals and at bedtime. 450 each 3  . Insulin Glargine (BASAGLAR KWIKPEN) 100 UNIT/ML SOPN Inject 0.65 mLs (65 Units total) into the skin at bedtime. 30 mL 0  . insulin lispro (HUMALOG) 100 UNIT/ML KiwkPen Inject 0.05 mLs (5 Units total) into the skin 3 (three) times daily. 15 mL 3  . levothyroxine (SYNTHROID) 25 MCG tablet Take  4 tablets by mouth daily before breakfast in the am. 120 tablet 1  . levothyroxine (SYNTHROID, LEVOTHROID) 150 MCG tablet Take 1 tablet (150 mcg total) by mouth daily. Take for 3 weeks then follow up with provider 21 tablet 0  . magnesium oxide (MAG-OX) 400 MG tablet TAKE 1 TABLET (400 MG TOTAL) BY MOUTH DAILY. 30 tablet 11  . metoprolol tartrate (LOPRESSOR) 50 MG tablet Take one and one-half tablet by mouth twice a day (Patient  taking differently: Take 75 mg by mouth 2 (two) times daily. ) 270 tablet 3  . MYRBETRIQ 25 MG TB24 tablet TAKE 1 TABLET BY MOUTH EVERY DAY 90 tablet 0  . PRADAXA 150 MG CAPS capsule TAKE 1 CAPSULE EVERY 12    HOURS 180 capsule 3   No facility-administered medications prior to visit.     PAST MEDICAL HISTORY: Past Medical History:  Diagnosis Date  . Chronic diastolic CHF (congestive heart failure) ([place])   . CKD (chronic kidney disease), stage III ([place])   . Coronary artery disease    a. Cath February 2012 in [facility], occluded RCA with collaterals  . DM type 2 (diabetes mellitus, type 2) ([place])   . Essential hypertension   . Hyperlipidemia   . Pacemaker    a. symptomatic brady after TAVR s/p MDT PPM by Dr. Curt Bears 12/04/17  . Persistent atrial fibrillation ([place])   . PVD (peripheral vascular disease) ([place])    a. s/p R popliteal artery stenosis tx with drug-coated balloon 05/2014, followed by Dr. Fletcher Anon.  . 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 Severe aortic stenosis    a. 12/02/17: s/p  TAVR  . Skin cancer   . Sleep apnea with use of continuous positive airway pressure (CPAP)    04-11-11 AHI was 32.9 and titrated to 15 cm H20, DME is [facility]  . Subclinical hypothyroidism     PAST SURGICAL HISTORY: Past Surgical History:  Procedure Laterality Date  . ABDOMINAL ANGIOGRAM N/A 06/08/2014   Procedure: ABDOMINAL ANGIOGRAM;  Surgeon: Wellington Hampshire, MD;  Location: [facility] CATH LAB;  Service: Cardiovascular;  Laterality: N/A;  . APPENDECTOMY  1965  . CARDIAC CATHETERIZATION  12/2010  . CARDIOVERSION  07/2011  . CARDIOVERSION N/A 04/18/2014   Procedure: CARDIOVERSION;  Surgeon: Dorothy Spark, MD;  Location: [place];  Service: Cardiovascular;  Laterality: N/A;  . CARDIOVERSION N/A 11/03/2015   Procedure: CARDIOVERSION;  Surgeon: Lelon Perla, MD;  Location: [facility] ENDOSCOPY;  Service: Cardiovascular;  Laterality: N/A;  . CARDIOVERSION N/A 05/08/2017   Procedure: CARDIOVERSION;  Surgeon: Dorothy Spark, MD;  Location: [facility] ENDOSCOPY;  Service: Cardiovascular;  Laterality: N/A;  . CARDIOVERSION N/A 07/28/2017   Procedure: CARDIOVERSION;  Surgeon: Dorothy Spark, MD;  Location: [place];  Service: Cardiovascular;  Laterality: N/A;  . LOWER EXTREMITY ANGIOGRAM N/A 06/08/2014   Procedure: LOWER EXTREMITY ANGIOGRAM;  Surgeon: Wellington Hampshire, MD;  Location: [facility] CATH LAB;  Service: Cardiovascular;  Laterality: N/A;  . PACEMAKER IMPLANT N/A 12/04/2017   Procedure: PACEMAKER IMPLANT;  Surgeon: Constance Haw, MD;  Location: [place] CV LAB;  Service: Cardiovascular;  Laterality: N/A;  . POPLITEAL ARTERY ANGIOPLASTY Right 06/08/2014   Archie Endo 06/08/2014  . RIGHT/LEFT HEART CATH AND CORONARY ANGIOGRAPHY N/A 10/08/2017   Procedure: RIGHT/LEFT HEART CATH AND CORONARY ANGIOGRAPHY;  Surgeon: Burnell Blanks, MD;  Location: [place] CV LAB;  Service: Cardiovascular;  Laterality: N/A;  . SKIN CANCER EXCISION Bilateral    "have had them cut off back of neck X 2; off left  upper arm; right wrist, near right shoulder blade" (06/08/2014)  . TEE WITHOUT CARDIOVERSION N/A 12/02/2017   Procedure: TRANSESOPHAGEAL ECHOCARDIOGRAM (TEE);  Surgeon: Burnell Blanks, MD;  Location: [place];  Service: Open Heart Surgery;  Laterality: N/A;  . TEMPORARY PACEMAKER N/A 12/04/2017   Procedure: TEMPORARY PACEMAKER;  Surgeon: Leonie Man,  MD;  Location: [place] CV LAB;  Service: Cardiovascular;  Laterality: N/A;  . TRANSCATHETER AORTIC VALVE REPLACEMENT, TRANSFEMORAL N/A 12/02/2017   Procedure: TRANSCATHETER AORTIC VALVE REPLACEMENT, TRANSFEMORAL;  Surgeon: Burnell Blanks, MD;  Location: [place];  Service: Open Heart Surgery;  Laterality: N/A;  using Edwards Sapien 3 Transcatheter Heart Valve size 31mm    FAMILY HISTORY: Family History  Problem Relation Age of Onset  . Diabetes Mother   . Heart attack Mother   . Hypertension Mother   . Heart attack Father   . Heart failure Father   . Hypertension Father   . Diabetes Father   . Diabetes Sister   . Diabetes Brother   . Diabetes Other   . Diabetes Daughter        TYPE ll  . Heart Problems Daughter   . Hypertension Sister   . Hypertension Brother   . Stroke Brother     SOCIAL HISTORY: Social History   Socioeconomic History  . Marital status: Married    Spouse name: [name]  . Number of children: 1  . Years of education: 25  . Highest education level: Not on file  Occupational History  . Occupation: retired    Fish farm manager: [place] [employer]  . Financial resource strain: Not on file  . Food insecurity:    Worry: Not on file    Inability: Not on file  . Transportation needs:    Medical: Not on file    Non-medical: Not on file  Tobacco Use  . Smoking status: Former Smoker    Packs/day: 2.00    Years: 14.00    Pack years: 28.00    Types: Cigarettes    Last attempt to quit: 11/11/1972    Years since quitting: 45.2  . Smokeless tobacco: Never Used  Substance and Sexual Activity    . Alcohol use: No    Comment: quit in 1984  . Drug use: No  . Sexual activity: Not Currently  Lifestyle  . Physical activity:    Days per week: Not on file    Minutes per session: Not on file  . Stress: Not on file  Relationships  . Social connections:    Talks on phone: Not on file    Gets together: Not on file    Attends religious service: Not on file    Active member of club or organization: Not on file    Attends meetings of clubs or organizations: Not on file    Relationship status: Not on file  . Intimate partner violence:    Fear of current or ex partner: Not on file    Emotionally abused: Not on file    Physically abused: Not on file    Forced sexual activity: Not on file  Other Topics Concern  . 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   Patient has one child and his wife has one child.   Patient is retired.   Patient has a high school education.   Patient is right-handed.   Patient drinks very little caffeine.      PHYSICAL EXAM  Vitals:   02/03/18 1452  BP: (!) 142/72  Pulse: 70  Weight: 254 lb (115.2 kg)  Height: 5\' 11"  (1.803 m)   Body mass index is 35.43 kg/m.  Generalized: Well developed, in no acute distress   Neurological examination  Mentation: Alert oriented to time, place, history  taking. Follows all commands speech and language fluent Cranial nerve II-XII: Pupils were equal round reactive to light. Extraocular movements were full, visual field were full on confrontational test.  Uvula tongue midline. Head turning and shoulder shrug  were normal and symmetric.Neck circumference 21 inches, Mallampati 3+ Motor: The motor testing reveals 5 over 5 strength of all 4 extremities. Good symmetric motor tone is noted throughout.  Sensory: Sensory testing is intact to soft touch on all 4 extremities. No evidence of extinction is noted.   Gait and station: Gait is normal.   .   DIAGNOSTIC  DATA (LABS, IMAGING, TESTING) - I reviewed patient records, labs, notes, testing and imaging myself where available.  Lab Results  Component Value Date   WBC 12.3 (H) 12/05/2017   HGB 11.9 (L) 12/05/2017   HCT 35.9 (L) 12/05/2017   MCV 97.6 12/05/2017   PLT 95 (L) 12/05/2017      Component Value Date/Time   NA 139 12/24/2017 1448   K 4.5 12/24/2017 1448   CL 101 12/24/2017 1448   CO2 22 12/24/2017 1448   GLUCOSE 211 (H) 12/24/2017 1448   GLUCOSE 192 (H) 12/05/2017 0320   BUN 20 12/24/2017 1448   CREATININE 1.43 (H) 12/24/2017 1448   CREATININE 1.75 (H) 10/13/2017 1224   CALCIUM 9.9 12/24/2017 1448   PROT 6.6 12/02/2017 0620   ALBUMIN 3.8 12/02/2017 0620   AST 28 12/02/2017 0620   ALT 23 12/02/2017 0620   ALKPHOS 34 (L) 12/02/2017 0620   BILITOT 0.6 12/02/2017 0620   GFRNONAA 48 (L) 12/24/2017 1448   GFRNONAA 38 (L) 10/13/2017 1224   GFRAA 55 (L) 12/24/2017 1448   GFRAA 43 (L) 10/13/2017 1224   Lab Results  Component Value Date   CHOL 155 10/13/2017   HDL 38 (L) 10/13/2017   LDLCALC 93 10/13/2017   TRIG 144 10/13/2017   CHOLHDL 4.1 10/13/2017   Lab Results  Component Value Date   HGBA1C 8.1 (H) 11/28/2017   No results found for: [id number] Lab Results  Component Value Date   TSH 20.91 (H) 01/12/2018      ASSESSMENT AND PLAN 77 y.o. year old male  has a past medical history of Chronic diastolic CHF (congestive heart failure) ([place]), CKD (chronic kidney disease), stage III ([place]), Coronary artery disease, DM type 2 (diabetes mellitus, type 2) ([place]), Essential hypertension, Hyperlipidemia, Pacemaker, Persistent atrial fibrillation ([place]), PVD (peripheral vascular disease) ([place]), S/P TAVR (transcatheter aortic valve replacement) (12/02/2017), Severe aortic stenosis, Skin cancer, Sleep apnea with use of continuous positive airway pressure (CPAP), and Subclinical hypothyroidism. here with:  1. Obstructive sleep apnea on CPAP  The patient's download shows excellent  compliance and good treatment of his apnea - he does have a high leak.  Follow up in 3 month  after starting new CPAP  He will follow-up after that once a year with Dr. Brett Fairy.  I spent 15 minutes with the patient 50% of this time was spent reviewing the patient's CPAP download and ordering new machine.    Larey Seat, MD  02/03/2018, 3:34 PM [facility] [address], [place], [place] [address] [phone]

## 2018-02-04 ENCOUNTER — Encounter: Payer: Self-pay | Admitting: Cardiology

## 2018-02-04 ENCOUNTER — Ambulatory Visit (INDEPENDENT_AMBULATORY_CARE_PROVIDER_SITE_OTHER): Payer: Medicare Other | Admitting: Cardiology

## 2018-02-04 VITALS — BP 110/56 | HR 77 | Ht 71.0 in | Wt 252.8 lb

## 2018-02-04 DIAGNOSIS — R0782 Intercostal pain: Secondary | ICD-10-CM | POA: Diagnosis not present

## 2018-02-04 DIAGNOSIS — I6523 Occlusion and stenosis of bilateral carotid arteries: Secondary | ICD-10-CM

## 2018-02-04 NOTE — Progress Notes (Signed)
02/04/2018 [name]   [birth year]  [id number]  Primary Physician [name], [name] Primary Cardiologist: Dr. Angelena Form   Reason for Visit/CC: Musculoskeletal chest pain   HPI:  Steven Diodato Jonesis a 77 y.o.malewith a history of CAD, PAF on amio and pradaxa, PPM, HTN, chronic diastolic CHF, HTN, HLD, IDDM, CKD stage III, PVD, OSA on CPAP, morbid obesity, and severe AS, who recently underwent TAVR in January 2019. Prior to TAVR, he had a R/LHC which showed moderate 60% proximal stenosis of the left anterior descending coronary artery that was felt not to be flow limiting. There was 100% chronic occlusion of the right coronary artery. There was otherwise nonobstructive coronary artery disease.  He presents to clinic today for evaluation given recent atypical chest pain, c/w musculoskeletal pain. He reports his wife got overly concerned and made him schedule a check-up. He was sleeping in a recliner the other night and tried to get up from the recliner but loss his balance and slid down the chair. He caught himself with his arms and tried lifting himself up with his hands on both arm rest. When he lifted up, it felt like he pulled muscle in his right anterior chest. This ached for a day or so but resolved. He denies any exertional chest pain. No exertional dyspnea. He also denies palpitations, dizziness, syncope/near syncope. EKG shows electronic ventricular pacemaker. HR 77 bpm. BP is stable.    Current Meds  Medication Sig  . amiodarone (PACERONE) 200 MG tablet Take 1 tablet (200 mg total) by mouth daily.  [name] Kitchen aspirin EC 81 MG tablet Take 81 mg by mouth daily.  [name] Kitchen atorvastatin (LIPITOR) 80 MG tablet Take 80 mg by mouth at bedtime.  . Blood Glucose Monitoring Suppl (FREESTYLE LITE) DEVI 1 each by Does not apply route 2 (two) times daily.  . Choline Fenofibrate 135 MG capsule Take 135 mg by mouth daily.  [name] Kitchen CINNAMON PO Take 1 tablet by mouth 2 (two) times daily.  . dabigatran (PRADAXA) 150  MG CAPS capsule Take 150 mg by mouth every 12 (twelve) hours.  . diphenhydramine-acetaminophen (TYLENOL PM) 25-500 MG TABS tablet Take 1 tablet by mouth at bedtime as needed (sleep).  . docusate sodium (COLACE) 100 MG capsule Take 100 mg daily by mouth.   . fesoterodine (TOVIAZ) 4 MG TB24 tablet Take 4 mg by mouth daily.  . furosemide (LASIX) 20 MG tablet Take 1 tablet (20 mg total) by mouth daily.  [name] Kitchen gabapentin (NEURONTIN) 300 MG capsule Take 1 capsule (300 mg total) by mouth 3 (three) times daily.  [name] Kitchen glipiZIDE (GLUCOTROL XL) 10 MG 24 hr tablet TAKE 1 TABLET (10 MG TOTAL) BY MOUTH DAILY WITH BREAKFAST.  [name] Kitchen glucose blood (FREESTYLE LITE) test strip 1 each by Other route 4 (four) times daily -  before meals and at bedtime.  . Insulin Glargine (BASAGLAR KWIKPEN) 100 UNIT/ML SOPN Inject 0.65 mLs (65 Units total) into the skin at bedtime.  . insulin lispro (HUMALOG) 100 UNIT/ML KiwkPen Inject 0.05 mLs (5 Units total) into the skin 3 (three) times daily.  . Insulin Pen Needle (BD PEN NEEDLE NANO U/F) 32G X 4 MM MISC Use as directed four (4) times daily.  [name] Kitchen levothyroxine (SYNTHROID, LEVOTHROID) 150 MCG tablet Take 1 tablet (150 mcg total) by mouth daily. Take for 3 weeks then follow up with provider  . magnesium oxide (MAG-OX) 400 MG tablet Take 400 mg by mouth daily.  . metoprolol tartrate (LOPRESSOR) 50 MG tablet Take 75  mg by mouth 2 (two) times daily.  . mirabegron ER (MYRBETRIQ) 25 MG TB24 tablet Take 25 mg by mouth daily.   No Known Allergies Past Medical History:  Diagnosis Date  . Chronic diastolic CHF (congestive heart failure) ([place])   . CKD (chronic kidney disease), stage III ([place])   . Coronary artery disease    a. Cath February 2012 in [facility], occluded RCA with collaterals  . DM type 2 (diabetes mellitus, type 2) ([place])   . Essential hypertension   . Hyperlipidemia   . Pacemaker    a. symptomatic brady after TAVR s/p MDT PPM by Dr. Curt Bears 12/04/17  . Persistent atrial fibrillation  ([place])   . PVD (peripheral vascular disease) ([place])    a. s/p R popliteal artery stenosis tx with drug-coated balloon 05/2014, followed by Dr. Fletcher Anon.  . 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 Severe aortic stenosis    a. 12/02/17: s/p TAVR  . Skin cancer   . Sleep apnea with use of continuous positive airway pressure (CPAP)    04-11-11 AHI was 32.9 and titrated to 15 cm H20, DME is [facility]  . Subclinical hypothyroidism    Family History  Problem Relation Age of Onset  . Diabetes Mother   . Heart attack Mother   . Hypertension Mother   . Heart attack Father   . Heart failure Father   . Hypertension Father   . Diabetes Father   . Diabetes Sister   . Diabetes Brother   . Diabetes Other   . Diabetes Daughter        TYPE ll  . Heart Problems Daughter   . Hypertension Sister   . Hypertension Brother   . Stroke Brother    Past Surgical History:  Procedure Laterality Date  . ABDOMINAL ANGIOGRAM N/A 06/08/2014   Procedure: ABDOMINAL ANGIOGRAM;  Surgeon: Wellington Hampshire, MD;  Location: [facility] LAB;  Service: Cardiovascular;  Laterality: N/A;  . APPENDECTOMY  1965  . CARDIAC CATHETERIZATION  12/2010  . CARDIOVERSION  07/2011  . CARDIOVERSION N/A 04/18/2014   Procedure: CARDIOVERSION;  Surgeon: Dorothy Spark, MD;  Location: [place];  Service: Cardiovascular;  Laterality: N/A;  . CARDIOVERSION N/A 11/03/2015   Procedure: CARDIOVERSION;  Surgeon: Lelon Perla, MD;  Location: [facility] ENDOSCOPY;  Service: Cardiovascular;  Laterality: N/A;  . CARDIOVERSION N/A 05/08/2017   Procedure: CARDIOVERSION;  Surgeon: Dorothy Spark, MD;  Location: [facility] ENDOSCOPY;  Service: Cardiovascular;  Laterality: N/A;  . CARDIOVERSION N/A 07/28/2017   Procedure: CARDIOVERSION;  Surgeon: Dorothy Spark, MD;  Location: [place];  Service: Cardiovascular;  Laterality: N/A;  . LOWER  EXTREMITY ANGIOGRAM N/A 06/08/2014   Procedure: LOWER EXTREMITY ANGIOGRAM;  Surgeon: Wellington Hampshire, MD;  Location: [facility] CATH LAB;  Service: Cardiovascular;  Laterality: N/A;  . PACEMAKER IMPLANT N/A 12/04/2017   Procedure: PACEMAKER IMPLANT;  Surgeon: Constance Haw, MD;  Location: [place] CV LAB;  Service: Cardiovascular;  Laterality: N/A;  . POPLITEAL ARTERY ANGIOPLASTY Right 06/08/2014   Archie Endo 06/08/2014  . RIGHT/LEFT HEART CATH AND CORONARY ANGIOGRAPHY N/A 10/08/2017   Procedure: RIGHT/LEFT HEART CATH AND CORONARY ANGIOGRAPHY;  Surgeon: Burnell Blanks, MD;  Location: [facility] CV LAB;  Service: Cardiovascular;  Laterality: N/A;  . SKIN CANCER EXCISION Bilateral    "have had them cut off back of neck X 2; off left upper arm;  right wrist, near right shoulder blade" (06/08/2014)  . TEE WITHOUT CARDIOVERSION N/A 12/02/2017   Procedure: TRANSESOPHAGEAL ECHOCARDIOGRAM (TEE);  Surgeon: Burnell Blanks, MD;  Location: [place];  Service: Open Heart Surgery;  Laterality: N/A;  . TEMPORARY PACEMAKER N/A 12/04/2017   Procedure: TEMPORARY PACEMAKER;  Surgeon: Leonie Man, MD;  Location: [place] CV LAB;  Service: Cardiovascular;  Laterality: N/A;  . TRANSCATHETER AORTIC VALVE REPLACEMENT, TRANSFEMORAL N/A 12/02/2017   Procedure: TRANSCATHETER AORTIC VALVE REPLACEMENT, TRANSFEMORAL;  Surgeon: Burnell Blanks, MD;  Location: [place];  Service: Open Heart Surgery;  Laterality: N/A;  using Edwards Sapien 3 Transcatheter Heart Valve size 39mm   Social History   Socioeconomic History  . Marital status: Married    Spouse name: [name]  . Number of children: 1  . Years of education: 32  . Highest education level: Not on file  Occupational History  . Occupation: retired    Fish farm manager: [employer]  . Financial resource strain: Not on file  . Food insecurity:    Worry: Not on file    Inability: Not on file  . Transportation needs:    Medical: Not on  file    Non-medical: Not on file  Tobacco Use  . Smoking status: Former Smoker    Packs/day: 2.00    Years: 14.00    Pack years: 28.00    Types: Cigarettes    Last attempt to quit: 11/11/1972    Years since quitting: 45.2  . Smokeless tobacco: Never Used  Substance and Sexual Activity  . Alcohol use: No    Comment: quit in 1984  . Drug use: No  . Sexual activity: Not Currently  Lifestyle  . Physical activity:    Days per week: Not on file    Minutes per session: Not on file  . Stress: Not on file  Relationships  . Social connections:    Talks on phone: Not on file    Gets together: Not on file    Attends religious service: Not on file    Active member of club or organization: Not on file    Attends meetings of clubs or organizations: Not on file    Relationship status: Not on file  . Intimate partner violence:    Fear of current or ex partner: Not on file    Emotionally abused: Not on file    Physically abused: Not on file    Forced sexual activity: Not on file  Other Topics Concern  . 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   Patient has one child and his wife has one child.   Patient is retired.   Patient has a high school education.   Patient is right-handed.   Patient drinks very little caffeine.     Review of Systems: General: negative for chills, fever, night sweats or weight changes.  Cardiovascular: negative for chest pain, dyspnea on exertion, edema, orthopnea, palpitations, paroxysmal nocturnal dyspnea or shortness of breath Dermatological: negative for rash Respiratory: negative for cough or wheezing Urologic: negative for hematuria Abdominal: negative for nausea, vomiting, diarrhea, bright red blood per rectum, melena, or hematemesis Neurologic: negative for visual changes, syncope, or dizziness All other systems reviewed and are otherwise negative except as noted above.   Physical Exam:    Blood pressure (!) 110/56, pulse 77, height 5\' 11"  (1.803 m), weight 252 lb 12.8 oz (114.7 kg).  General appearance: alert, cooperative and no distress Neck: no carotid bruit and no JVD Lungs: clear to auscultation bilaterally Heart: regular rate and rhythm, S1, S2 normal, no murmur, click, rub or gallop Extremities: extremities normal, atraumatic, no cyanosis or edema Pulses: 2+ and symmetric Skin: Skin color, texture, turgor normal. No rashes or lesions Neurologic: Grossly normal  EKG Electronic ventricular pacemaker -- personally reviewed   ASSESSMENT AND PLAN:   1. Chest Pain: c/w with musculoskeletal CP and has resolved. No anginal symptomatology.   2. H/o Severe AS: s/p recent TAVR 11/2017. Stable w/o cardiac chest pain or dyspnea.   3. CAD: recent LHC prior to valve replacement showed moderate 60% proximal stenosis of the left anterior descending coronary artery that was felt not to be flow limiting. There was 100% chronic occlusion of the right coronary artery. There was otherwise nonobstructive coronary artery disease.He is stable w/o angina. Continue medical therapy.   4. PPM: followed by EP and has a f/u next month.   5. HTN: controlled on current regimen.   6. DM: followed by PCP.  7. OSA: reports full compliance with CPAP. Followed by PCP.   8.Chronic Diastolic HF: volume stable. No dyspnea.   Follow-Up: keep f/u with Dr. Angelena Form in June.   Blima Jaimes Ladoris Gene, MHS [facility] 02/04/2018 9:41 AM

## 2018-02-04 NOTE — Patient Instructions (Signed)
Medication Instructions:  Your physician recommends that you continue on your current medications as directed. Please refer to the Current Medication list given to you today.   Labwork: none  Testing/Procedures: none  Follow-Up: Follow up with Dr. Curt Bears and Dr. Angelena Form as planned  Any Other Special Instructions Will Be Listed Below (If Applicable).     If you need a refill on your cardiac medications before your next appointment, please call your pharmacy.

## 2018-02-06 ENCOUNTER — Telehealth: Payer: Self-pay | Admitting: *Deleted

## 2018-02-06 NOTE — Telephone Encounter (Signed)
Faxed signed CPAP order and office note to [facility]. Received a receipt of confirmation.

## 2018-02-10 DIAGNOSIS — L905 Scar conditions and fibrosis of skin: Secondary | ICD-10-CM | POA: Diagnosis not present

## 2018-02-10 DIAGNOSIS — C44319 Basal cell carcinoma of skin of other parts of face: Secondary | ICD-10-CM | POA: Diagnosis not present

## 2018-02-11 ENCOUNTER — Other Ambulatory Visit: Payer: Self-pay | Admitting: Physician Assistant

## 2018-02-16 ENCOUNTER — Ambulatory Visit: Payer: Medicare Other | Admitting: Physician Assistant

## 2018-02-17 DIAGNOSIS — C44622 Squamous cell carcinoma of skin of right upper limb, including shoulder: Secondary | ICD-10-CM | POA: Diagnosis not present

## 2018-02-17 DIAGNOSIS — L905 Scar conditions and fibrosis of skin: Secondary | ICD-10-CM | POA: Diagnosis not present

## 2018-02-18 ENCOUNTER — Other Ambulatory Visit: Payer: Self-pay

## 2018-02-18 NOTE — Telephone Encounter (Signed)
Refill fax received from [facility] for levothyroxin 25 mg tabs, This medication dose was changed to levothyroxine 189mcg

## 2018-03-01 ENCOUNTER — Other Ambulatory Visit: Payer: Self-pay | Admitting: Physician Assistant

## 2018-03-02 ENCOUNTER — Ambulatory Visit: Payer: Self-pay | Admitting: Physician Assistant

## 2018-03-04 ENCOUNTER — Encounter: Payer: Self-pay | Admitting: Cardiology

## 2018-03-04 ENCOUNTER — Encounter (INDEPENDENT_AMBULATORY_CARE_PROVIDER_SITE_OTHER): Payer: Self-pay

## 2018-03-04 ENCOUNTER — Ambulatory Visit (INDEPENDENT_AMBULATORY_CARE_PROVIDER_SITE_OTHER): Payer: Medicare Other | Admitting: Cardiology

## 2018-03-04 VITALS — BP 108/60 | HR 60 | Ht 71.0 in | Wt 251.4 lb

## 2018-03-04 DIAGNOSIS — Z950 Presence of cardiac pacemaker: Secondary | ICD-10-CM

## 2018-03-04 DIAGNOSIS — I442 Atrioventricular block, complete: Secondary | ICD-10-CM

## 2018-03-04 DIAGNOSIS — I480 Paroxysmal atrial fibrillation: Secondary | ICD-10-CM

## 2018-03-04 DIAGNOSIS — R001 Bradycardia, unspecified: Secondary | ICD-10-CM | POA: Diagnosis not present

## 2018-03-04 DIAGNOSIS — G4733 Obstructive sleep apnea (adult) (pediatric): Secondary | ICD-10-CM | POA: Diagnosis not present

## 2018-03-04 DIAGNOSIS — Z952 Presence of prosthetic heart valve: Secondary | ICD-10-CM | POA: Diagnosis not present

## 2018-03-04 DIAGNOSIS — I10 Essential (primary) hypertension: Secondary | ICD-10-CM | POA: Diagnosis not present

## 2018-03-04 DIAGNOSIS — I6523 Occlusion and stenosis of bilateral carotid arteries: Secondary | ICD-10-CM

## 2018-03-04 DIAGNOSIS — E785 Hyperlipidemia, unspecified: Secondary | ICD-10-CM | POA: Diagnosis not present

## 2018-03-04 NOTE — Progress Notes (Signed)
Electrophysiology Office Note   Date:  03/04/2018   ID:  Steven, Maldonado 27-Oct-1941, MRN [id number]  PCP:  [name] [name]  Cardiologist:  Angelena Form Primary Electrophysiologist:  Luchiano Viscomi Meredith Leeds, MD    Chief Complaint  Patient presents with  . Pacemaker Check    Bradycardia/Persistent Afib     History of Present Illness: Steven Maldonado is a 77 y.o. male who is being seen today for the evaluation of complete AV block at the request of Steven Maldonado. Presenting today for electrophysiology evaluation. 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    Today, he denies symptoms of palpitations, chest pain, shortness of breath, orthopnea, PND, lower extremity edema, claudication, dizziness, presyncope, syncope, bleeding, or neurologic sequela. The patient is tolerating medications without difficulties.  He is feeling well.  He has had no further episodes of atrial fibrillation.  His device is functioning appropriately.  He has no complaints.   Past Medical History:  Diagnosis Date  . Chronic diastolic CHF (congestive heart failure) ([place])   . CKD (chronic kidney disease), stage III ([place])   . Coronary artery disease    a. Cath February 2012 in [place] [facility], occluded RCA with collaterals  . DM type 2 (diabetes mellitus, type 2) ([place])   . Essential hypertension   . Hyperlipidemia   . Pacemaker    a. symptomatic brady after TAVR s/p MDT PPM by Dr. Curt Bears 12/04/17  . Persistent atrial fibrillation ([place])   . PVD (peripheral vascular disease) ([place])    a. s/p R popliteal artery stenosis tx with drug-coated balloon 05/2014, followed by Dr. Fletcher Anon.  . 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 Severe aortic stenosis    a. 12/02/17: s/p TAVR  .  Skin cancer   . Sleep apnea with use of continuous positive airway pressure (CPAP)    04-11-11 AHI was 32.9 and titrated to 15 cm H20, DME is [facility]  . Subclinical hypothyroidism    Past Surgical History:  Procedure Laterality Date  . ABDOMINAL ANGIOGRAM N/A 06/08/2014   Procedure: ABDOMINAL ANGIOGRAM;  Surgeon: Wellington Hampshire, MD;  Location: [facility] CATH LAB;  Service: Cardiovascular;  Laterality: N/A;  . APPENDECTOMY  1965  . CARDIAC CATHETERIZATION  12/2010  . CARDIOVERSION  07/2011  . CARDIOVERSION N/A 04/18/2014   Procedure: CARDIOVERSION;  Surgeon: Dorothy Spark, MD;  Location: [place];  Service: Cardiovascular;  Laterality: N/A;  . CARDIOVERSION N/A 11/03/2015   Procedure: CARDIOVERSION;  Surgeon: Lelon Perla, MD;  Location: [facility] ENDOSCOPY;  Service: Cardiovascular;  Laterality: N/A;  . CARDIOVERSION N/A 05/08/2017   Procedure: CARDIOVERSION;  Surgeon: Dorothy Spark, MD;  Location: [facility] ENDOSCOPY;  Service: Cardiovascular;  Laterality: N/A;  . CARDIOVERSION N/A 07/28/2017   Procedure: CARDIOVERSION;  Surgeon: Dorothy Spark, MD;  Location: [place];  Service: Cardiovascular;  Laterality: N/A;  . LOWER EXTREMITY ANGIOGRAM N/A 06/08/2014   Procedure: LOWER EXTREMITY ANGIOGRAM;  Surgeon: Wellington Hampshire, MD;  Location: [facility] CATH LAB;  Service: Cardiovascular;  Laterality: N/A;  . PACEMAKER IMPLANT N/A 12/04/2017   Procedure: PACEMAKER IMPLANT;  Surgeon: Constance Haw, MD;  Location: [place] CV LAB;  Service: Cardiovascular;  Laterality: N/A;  . POPLITEAL ARTERY ANGIOPLASTY Right 06/08/2014   Archie Endo 06/08/2014  . RIGHT/LEFT  HEART CATH AND CORONARY ANGIOGRAPHY N/A 10/08/2017   Procedure: RIGHT/LEFT HEART CATH AND CORONARY ANGIOGRAPHY;  Surgeon: Burnell Blanks, MD;  Location: [place] CV LAB;  Service: Cardiovascular;  Laterality: N/A;  . SKIN CANCER EXCISION Bilateral    "have had them cut off back of neck X 2; off left upper arm; right wrist, near right  shoulder blade" (06/08/2014)  . TEE WITHOUT CARDIOVERSION N/A 12/02/2017   Procedure: TRANSESOPHAGEAL ECHOCARDIOGRAM (TEE);  Surgeon: Burnell Blanks, MD;  Location: [place];  Service: Open Heart Surgery;  Laterality: N/A;  . TEMPORARY PACEMAKER N/A 12/04/2017   Procedure: TEMPORARY PACEMAKER;  Surgeon: Leonie Man, MD;  Location: [place] CV LAB;  Service: Cardiovascular;  Laterality: N/A;  . TRANSCATHETER AORTIC VALVE REPLACEMENT, TRANSFEMORAL N/A 12/02/2017   Procedure: TRANSCATHETER AORTIC VALVE REPLACEMENT, TRANSFEMORAL;  Surgeon: Burnell Blanks, MD;  Location: [place];  Service: Open Heart Surgery;  Laterality: N/A;  using Edwards Sapien 3 Transcatheter Heart Valve size 39mm     Current Outpatient Medications  Medication Sig Dispense Refill  . amiodarone (PACERONE) 200 MG tablet Take 1 tablet (200 mg total) by mouth daily. 90 tablet 3  . aspirin EC 81 MG tablet Take 81 mg by mouth daily.    [name] Kitchen atorvastatin (LIPITOR) 80 MG tablet Take 80 mg by mouth at bedtime.    . Blood Glucose Monitoring Suppl (FREESTYLE LITE) DEVI 1 each by Does not apply route 2 (two) times daily. 1 each 0  . Choline Fenofibrate 135 MG capsule Take 135 mg by mouth daily.    [name] Kitchen CINNAMON PO Take 1 tablet by mouth 2 (two) times daily.    . dabigatran (PRADAXA) 150 MG CAPS capsule Take 150 mg by mouth every 12 (twelve) hours.    . diphenhydramine-acetaminophen (TYLENOL PM) 25-500 MG TABS tablet Take 1 tablet by mouth at bedtime as needed (sleep).    . docusate sodium (COLACE) 100 MG capsule Take 100 mg daily by mouth.     . fesoterodine (TOVIAZ) 4 MG TB24 tablet Take 4 mg by mouth daily.    . furosemide (LASIX) 20 MG tablet Take 1 tablet (20 mg total) by mouth daily. 90 tablet 3  . gabapentin (NEURONTIN) 300 MG capsule Take 1 capsule (300 mg total) by mouth 3 (three) times daily. 90 capsule 5  . glipiZIDE (GLUCOTROL XL) 10 MG 24 hr tablet TAKE 1 TABLET (10 MG TOTAL) BY MOUTH DAILY WITH BREAKFAST. 30  tablet 5  . glucose blood (FREESTYLE LITE) test strip 1 each by Other route 4 (four) times daily -  before meals and at bedtime. 450 each 3  . HUMALOG KWIKPEN 100 UNIT/ML KiwkPen INJECT 5 UNITS             SUBCUTANEOUSLY 3 TIMES A   DAY 15 mL 3  . Insulin Glargine (BASAGLAR KWIKPEN) 100 UNIT/ML SOPN Inject 0.65 mLs (65 Units total) into the skin at bedtime. 30 mL 0  . insulin lispro (HUMALOG) 100 UNIT/ML KiwkPen Inject 0.05 mLs (5 Units total) into the skin 3 (three) times daily. 15 mL 3  . Insulin Pen Needle (BD PEN NEEDLE NANO U/F) 32G X 4 MM MISC Use as directed four (4) times daily.    [name] Kitchen levothyroxine (SYNTHROID, LEVOTHROID) 150 MCG tablet TAKE 1 TABLET (150 MCG TOTAL) BY MOUTH DAILY. TAKE FOR 3 WEEKS THEN FOLLOW UP WITH PROVIDER 21 tablet 0  . magnesium oxide (MAG-OX) 400 MG tablet Take 400 mg by mouth daily.    [name] Kitchen  metoprolol tartrate (LOPRESSOR) 50 MG tablet Take 75 mg by mouth 2 (two) times daily.    . mirabegron ER (MYRBETRIQ) 25 MG TB24 tablet Take 25 mg by mouth daily.     No current facility-administered medications for this visit.     Allergies:   Patient has no known allergies.   Social History:  The patient  reports that he quit smoking about 45 years ago. His smoking use included cigarettes. He has a 28.00 pack-year smoking history. He has never used smokeless tobacco. He reports that he does not drink alcohol or use drugs.   Family History:  The patient's family history includes Diabetes in his brother, daughter, father, mother, other, and sister; Heart Problems in his daughter; Heart attack in his father and mother; Heart failure in his father; Hypertension in his brother, father, mother, and sister; Stroke in his brother.    ROS:  Please see the history of present illness.   Otherwise, review of systems is positive for none.   All other systems are reviewed and negative.    PHYSICAL EXAM: VS:  BP 108/60   Pulse 60   Ht 5\' 11"  (1.803 m)   Wt 251 lb 6.4 oz (114 kg)   SpO2  96%   BMI 35.06 kg/m  , BMI Body mass index is 35.06 kg/m. GEN: Well nourished, well developed, in no acute distress  HEENT: normal  Neck: no JVD, carotid bruits, or masses Cardiac: RRR; no murmurs, rubs, or gallops,no edema  Respiratory:  clear to auscultation bilaterally, normal work of breathing GI: soft, nontender, nondistended, + BS MS: no deformity or atrophy  Skin: warm and dry,  device pocket is well healed Neuro:  Strength and sensation are intact Psych: euthymic mood, full affect  EKG:  EKG is not ordered today. Personal review of the ekg ordered 01/25/18 shows A sense, V paced  Device interrogation is reviewed today in detail.  See PaceArt for details.   Recent Labs: 11/28/2017: B Natriuretic Peptide 47.9 12/02/2017: ALT 23 12/03/2017: Magnesium 1.9 12/05/2017: Hemoglobin 11.9; Platelets 95 12/11/2017: NT-Pro BNP 271 12/24/2017: BUN 20; Creatinine, Ser 1.43; Potassium 4.5; Sodium 139 01/12/2018: TSH 20.91    Lipid Panel     Component Value Date/Time   CHOL 155 10/13/2017 1224   TRIG 144 10/13/2017 1224   HDL 38 (L) 10/13/2017 1224   CHOLHDL 4.1 10/13/2017 1224   VLDL 32 (H) 05/01/2017 1216   LDLCALC 93 10/13/2017 1224     Wt Readings from Last 3 Encounters:  03/04/18 251 lb 6.4 oz (114 kg)  02/04/18 252 lb 12.8 oz (114.7 kg)  02/03/18 254 lb (115.2 kg)      Other studies Reviewed: Additional studies/ records that were reviewed today include: TTE 12/24/17  Review of the above records today demonstrates:  - Left ventricle: Inferobasal hypokinesis The cavity size was   normal. Wall thickness was increased in a pattern of severe LVH.   Systolic function was normal. The estimated ejection fraction was   in the range of 55% to 60%. - Aortic valve: Post TAVR with no significant peri valvualr   regurgitation gradients have increased since 12/03/17. - Mitral valve: Severely calcified annulus. Moderately thickened,   moderately calcified leaflets . There was mild  regurgitation. - Left atrium: The atrium was moderately dilated. - Atrial septum: No defect or patent foramen ovale was identified.   ASSESSMENT AND PLAN:  1.  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  She appropriately.  No changes.  Remains in sinus rhythm.  No issues.  No changes.  2. Paroxysmal atrial fibrillation: On Amiodarone and pradaxa.  Mains in sinus rhythm.  No changes.  This patients CHA2DS2-VASc Score and unadjusted Ischemic Stroke Rate (% per year) is equal to 4.8 % stroke rate/year from a score of 4  Above score calculated as 1 point each if present [CHF, HTN, DM, Vascular=MI/PAD/Aortic Plaque, Age if 65-74, or Male] Above score calculated as 2 points each if present [Age > 75, or Stroke/TIA/TE]  3. Hypertension: Blood pressure well controlled today no changes.  4. Coronary artery disease: No current chest pain  5. Hyperlipidemia: Continue statin  6. OSA: encouraged CPAP compliance    Current medicines are reviewed at length with the patient today.   The patient does not have concerns regarding his medicines.  The following changes were made today:  none  Labs/ tests ordered today include:  No orders of the defined types were placed in this encounter.    Disposition:   FU with Kani Jobson 9 months  Signed, Woodward Klem Meredith Leeds, MD  03/04/2018 10:07 AM     [place] [address] [phone] (office) [phone] (fax)

## 2018-03-04 NOTE — Patient Instructions (Signed)
Medication Instructions:  Your physician recommends that you continue on your current medications as directed. Please refer to the Current Medication list given to you today.  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 This monitoring reduces the number of office visits required to check your device to one time per year. It allows [place] to keep an eye on the functioning of your device to ensure it is working properly. You are scheduled for a device check from home on 06/03/2018. You may send your transmission at any time that day. If you have a wireless device, the transmission will be sent automatically. After your physician reviews your transmission, you will receive a postcard with your next transmission date.  Your physician wants you to follow-up in: 6 months with Dr. Curt Bears.  You will receive a reminder letter in the mail two months in advance. If you don't receive a letter, please call our office to schedule the follow-up appointment.  Thank you for choosing [facility]!!   Trinidad Curet, RN [phone]  Any Other Special Instructions Will Be Listed Below (If Applicable).

## 2018-03-17 ENCOUNTER — Telehealth (HOSPITAL_COMMUNITY): Payer: Self-pay

## 2018-03-20 NOTE — Telephone Encounter (Signed)
Unable to reach patient to conduct review of medications.   Steven Maldonado, PharmD Pharmacy Resident Pager: [phone]

## 2018-03-21 ENCOUNTER — Other Ambulatory Visit: Payer: Self-pay | Admitting: Physician Assistant

## 2018-03-24 ENCOUNTER — Ambulatory Visit (HOSPITAL_COMMUNITY): Payer: Medicare Other

## 2018-03-24 ENCOUNTER — Ambulatory Visit: Payer: Medicare Other | Admitting: Cardiovascular Disease

## 2018-03-24 ENCOUNTER — Encounter (HOSPITAL_COMMUNITY)
Admission: RE | Admit: 2018-03-24 | Discharge: 2018-03-24 | Disposition: A | Discharge status: Home / Self Care | Payer: Medicare Other | Source: Ambulatory Visit | Attending: Cardiovascular Disease | Admitting: Cardiovascular Disease

## 2018-03-24 ENCOUNTER — Encounter (HOSPITAL_COMMUNITY): Payer: Self-pay

## 2018-03-24 VITALS — BP 142/78 | HR 79 | Ht 70.5 in | Wt 248.9 lb

## 2018-03-24 DIAGNOSIS — I739 Peripheral vascular disease, unspecified: Secondary | ICD-10-CM | POA: Diagnosis not present

## 2018-03-24 DIAGNOSIS — Z952 Presence of prosthetic heart valve: Secondary | ICD-10-CM | POA: Diagnosis not present

## 2018-03-24 DIAGNOSIS — Z7982 Long term (current) use of aspirin: Secondary | ICD-10-CM | POA: Insufficient documentation

## 2018-03-24 DIAGNOSIS — Z794 Long term (current) use of insulin: Secondary | ICD-10-CM | POA: Diagnosis not present

## 2018-03-24 DIAGNOSIS — I2510 Atherosclerotic heart disease of native coronary artery without angina pectoris: Secondary | ICD-10-CM | POA: Diagnosis not present

## 2018-03-24 DIAGNOSIS — E119 Type 2 diabetes mellitus without complications: Secondary | ICD-10-CM | POA: Diagnosis not present

## 2018-03-24 DIAGNOSIS — I5032 Chronic diastolic (congestive) heart failure: Secondary | ICD-10-CM | POA: Diagnosis not present

## 2018-03-24 DIAGNOSIS — Z87891 Personal history of nicotine dependence: Secondary | ICD-10-CM | POA: Insufficient documentation

## 2018-03-24 DIAGNOSIS — I110 Hypertensive heart disease with heart failure: Secondary | ICD-10-CM | POA: Insufficient documentation

## 2018-03-24 NOTE — Progress Notes (Signed)
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  amiodarone (PACERONE) 200 MG tablet, Take 1 tablet (200 mg total) by mouth daily., Disp: 90 tablet, Rfl: 3 .  aspirin EC 81 MG tablet, Take 81 mg by mouth daily., Disp: , Rfl:  .  atorvastatin (LIPITOR) 80 MG tablet, Take 80 mg by mouth at bedtime., Disp: , Rfl:  .  Blood Glucose Monitoring Suppl (FREESTYLE LITE) DEVI, 1 each by Does not apply route 2 (two) times daily., Disp: 1 each, Rfl: 0 .  Choline Fenofibrate 135 MG capsule, Take 135 mg by mouth daily., Disp: , Rfl:  .  CINNAMON PO, Take 1 tablet by mouth 2 (two) times daily., Disp: , Rfl:  .  dabigatran (PRADAXA) 150 MG CAPS capsule, Take 150 mg by mouth every 12 (twelve) hours., Disp: , Rfl:  .  diphenhydramine-acetaminophen (TYLENOL PM) 25-500 MG TABS tablet, Take 1 tablet by mouth at bedtime as needed (sleep)., Disp: , Rfl:  .  docusate sodium (COLACE) 100 MG capsule, Take 100 mg daily by mouth. , Disp: , Rfl:  .  fesoterodine (TOVIAZ) 4 MG TB24 tablet, Take 4 mg by mouth daily., Disp: , Rfl:  .  furosemide (LASIX) 20 MG tablet, Take 1 tablet (20 mg total) by mouth daily., Disp: 90 tablet, Rfl: 3 .  gabapentin (NEURONTIN) 300 MG capsule, Take 1 capsule (300 mg total) by mouth 3 (three) times daily., Disp: 90 capsule, Rfl: 5 .  glipiZIDE (GLUCOTROL XL) 10 MG 24 hr  tablet, TAKE 1 TABLET (10 MG TOTAL) BY MOUTH DAILY WITH BREAKFAST., Disp: 30 tablet, Rfl: 5 .  glucose blood (FREESTYLE LITE) test strip, 1 each by Other route 4 (four) times daily -  before meals and at bedtime., Disp: 450 each, Rfl: 3 .  HUMALOG KWIKPEN 100 UNIT/ML KiwkPen, INJECT 5 UNITS             SUBCUTANEOUSLY 3 TIMES A   DAY, Disp: 15 mL, Rfl: 3 .  Insulin Glargine (BASAGLAR KWIKPEN) 100 UNIT/ML SOPN, Inject 0.65 mLs (65 Units total) into the skin at bedtime., Disp: 30 mL, Rfl: 0 .  insulin lispro (HUMALOG) 100 UNIT/ML KiwkPen, Inject 0.05 mLs (5 Units total) into the skin 3 (three) times daily., Disp: 15 mL, Rfl: 3 .  Insulin Pen Needle (BD PEN NEEDLE NANO U/F) 32G X 4 MM MISC, Use as directed four (4) times daily., Disp: , Rfl:  .  levothyroxine (SYNTHROID, LEVOTHROID) 150 MCG tablet, TAKE 1 TABLET (150 MCG TOTAL) BY MOUTH DAILY. TAKE FOR 3 WEEKS THEN FOLLOW UP WITH PROVIDER, Disp: 21 tablet, Rfl: 0 .  magnesium oxide (MAG-OX) 400 MG tablet, Take 400 mg by mouth daily., Disp: , Rfl:  .  metoprolol tartrate (LOPRESSOR) 50 MG tablet, Take 75 mg by mouth 2 (two) times daily., Disp: , Rfl:  .  mirabegron ER (MYRBETRIQ) 25 MG TB24 tablet, Take  25 mg by mouth daily., Disp: , Rfl:   Past Medical History: Past Medical History:  Diagnosis Date  . Chronic diastolic CHF (congestive heart failure) ([place])   . CKD (chronic kidney disease), stage III ([place])   . Coronary artery disease    a. Cath February 2012 in [facility], occluded RCA with collaterals  . DM type 2 (diabetes mellitus, type 2) ([place])   . Essential hypertension   . Hyperlipidemia   . Pacemaker    a. symptomatic brady after TAVR s/p MDT PPM by Dr. Curt Bears 12/04/17  . Persistent atrial fibrillation ([place])   . PVD (peripheral vascular disease) ([place])    a. s/p R popliteal artery stenosis tx with drug-coated balloon 05/2014, followed by Dr. Fletcher Anon.  . 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 Severe aortic stenosis    a. 12/02/17: s/p TAVR  . Skin cancer   . Sleep apnea with use of continuous positive airway pressure (CPAP)    04-11-11 AHI was 32.9 and titrated to 15 cm H20, DME is [facility]  . Subclinical hypothyroidism     Tobacco Use: Social History   Tobacco Use  Smoking Status Former Smoker  . Packs/day: 2.00  . Years: 14.00  . Pack years: 28.00  . Types: Cigarettes  . Last attempt to quit: 11/11/1972  . Years since quitting: 45.3  Smokeless Tobacco Never Used    Labs: Recent Chemical engineer    Labs for ITP Cardiac and Pulmonary Rehab Latest Ref Rng & Units 10/13/2017 11/28/2017 12/02/2017 12/02/2017 12/02/2017   Cholestrol <200 mg/dL 155 - - - -   LDLCALC mg/dL (calc) 93 - - - -   HDL >40 mg/dL 38(L) - - - -   Trlycerides <150 mg/dL 144 - - - -   Hemoglobin A1c 4.8 - 5.6 % 11.2(H) 8.1(H) - - -   PHART 7.350 - 7.450 - 7.471(H) - - 7.374   PCO2ART 32.0 - 48.0 mmHg - 32.8 - - 39.6   HCO3 20.0 - 28.0 mmol/L - 23.6 - - 23.3   TCO2 22 - 32 mmol/L - - 26 28 24    ACIDBASEDEF 0.0 - 2.0 mmol/L - - - - 2.0   O2SAT % - 87.7 - - 99.0      Capillary Blood Glucose: Lab Results  Component Value Date   GLUCAP 193 (H) 12/05/2017   GLUCAP 203 (H) 12/04/2017   GLUCAP 223 (H) 12/04/2017   GLUCAP 204 (H) 12/04/2017   GLUCAP 244 (H) 12/04/2017     Exercise Target Goals: Date: 03/24/18  Exercise Program Goal: Individual exercise prescription set using results from initial 6 min walk test and THRR while considering  patient's activity barriers and safety.   Exercise Prescription Goal: Initial exercise prescription builds to 30-45 minutes a day of aerobic activity, 2-3 days per week.  Home exercise guidelines will be given to patient during program as part of exercise prescription that the participant will acknowledge.  Activity Barriers & Risk Stratification: Activity Barriers & Cardiac Risk  Stratification - 03/24/18 0847      Activity Barriers & Cardiac Risk Stratification   Activity Barriers  Muscular Weakness;Deconditioning;Balance Concerns;Other (comment)    Comments  PAD, B neuropathy    Cardiac Risk Stratification  High       6 Minute Walk: 6 Minute Walk    Row Name 03/24/18 0846 03/24/18 1051  6 Minute Walk   Phase  Initial  -    Distance  780 feet  -    Walk Time  4.4 minutes  -    # of Rest Breaks  1  -    MPH  -  2    METS  -  1.2    RPE  9  9    VO2 Peak  -  4.3    Symptoms  Yes (comment)  -    Comments  B neuropathy, short rest break of 1 minute and 20 seconds  -    Resting HR  79 bpm  -    Resting BP  142/78  -    Resting Oxygen Saturation   100 %  -    Exercise Oxygen Saturation  during 6 min walk  99 %  -    Max Ex. HR  80 bpm  80 bpm    Max Ex. BP  144/62  144/62    2 Minute Post BP  -  112/64       Oxygen Initial Assessment:   Oxygen Re-Evaluation:   Oxygen Discharge (Final Oxygen Re-Evaluation):   Initial Exercise Prescription: Initial Exercise Prescription - 03/24/18 1000      Date of Initial Exercise RX and Referring Provider   Date  03/24/18    Referring Provider  [name] MD      Recumbant Bike   Level  1.5    Watts  15    Minutes  5    METs  1.8      NuStep   Level  2    SPM  60    Minutes  10    METs  1.7      Arm Ergometer   Level  1    Watts  5    Minutes  10    METs  1.2      Track   Laps  3    Minutes  5    METs  1.4      Prescription Details   Frequency (times per week)  3    Duration  Progress to 30 minutes of continuous aerobic without signs/symptoms of physical distress      Intensity   THRR 40-80% of Max Heartrate  58-117    Ratings of Perceived Exertion  11-13    Perceived Dyspnea  0-4      Progression   Progression  Continue to progress workloads to maintain intensity without signs/symptoms of physical distress.      Resistance Training   Training Prescription  Yes     Weight  2lbs    Reps  10-15       Perform Capillary Blood Glucose checks as needed.  Exercise Prescription Changes:   Exercise Comments:   Exercise Goals and Review: Exercise Goals    Row Name 03/24/18 0848             Exercise Goals   Increase Physical Activity  Yes       Intervention  Provide advice, education, support and counseling about physical activity/exercise needs.;Develop an individualized exercise prescription for aerobic and resistive training based on initial evaluation findings, risk stratification, comorbidities and participant's personal goals.       Expected Outcomes  Short Term: Attend rehab on a regular basis to increase amount of physical activity.;Long Term: Exercising regularly at least 3-5 days a week.;Long Term: Add in home exercise to make exercise part of routine  and to increase amount of physical activity.       Increase Strength and Stamina  Yes improve walking tolerance       Intervention  Provide advice, education, support and counseling about physical activity/exercise needs.;Develop an individualized exercise prescription for aerobic and resistive training based on initial evaluation findings, risk stratification, comorbidities and participant's personal goals.       Expected Outcomes  Short Term: Increase workloads from initial exercise prescription for resistance, speed, and METs.;Short Term: Perform resistance training exercises routinely during rehab and add in resistance training at home;Long Term: Improve cardiorespiratory fitness, muscular endurance and strength as measured by increased METs and functional capacity (6MWT)       Able to understand and use rate of perceived exertion (RPE) scale  Yes       Intervention  Provide education and explanation on how to use RPE scale       Expected Outcomes  Short Term: Able to use RPE daily in rehab to express subjective intensity level;Long Term:  Able to use RPE to guide intensity level when  exercising independently       Knowledge and understanding of Target Heart Rate Range (THRR)  Yes       Intervention  Provide education and explanation of THRR including how the numbers were predicted and where they are located for reference       Expected Outcomes  Short Term: Able to state/look up THRR;Long Term: Able to use THRR to govern intensity when exercising independently;Short Term: Able to use daily as guideline for intensity in rehab       Able to check pulse independently  Yes       Intervention  Provide education and demonstration on how to check pulse in carotid and radial arteries.;Review the importance of being able to check your own pulse for safety during independent exercise       Expected Outcomes  Short Term: Able to explain why pulse checking is important during independent exercise;Long Term: Able to check pulse independently and accurately       Understanding of Exercise Prescription  Yes       Intervention  Provide education, explanation, and written materials on patient's individual exercise prescription       Expected Outcomes  Short Term: Able to explain program exercise prescription;Long Term: Able to explain home exercise prescription to exercise independently          Exercise Goals Re-Evaluation :    Discharge Exercise Prescription (Final Exercise Prescription Changes):   Nutrition:  Target Goals: Understanding of nutrition guidelines, daily intake of sodium 1500mg , cholesterol 200mg , calories 30% from fat and 7% or less from saturated fats, daily to have 5 or more servings of fruits and vegetables.  Biometrics: Pre Biometrics - 03/24/18 1109      Pre Biometrics   % Body Fat  32.9 %        Nutrition Therapy Plan and Nutrition Goals:   Nutrition Assessments:   Nutrition Goals Re-Evaluation:   Nutrition Goals Re-Evaluation:   Nutrition Goals Discharge (Final Nutrition Goals Re-Evaluation):   Psychosocial: Target Goals: Acknowledge  presence or absence of significant depression and/or stress, maximize coping skills, provide positive support system. Participant is able to verbalize types and ability to use techniques and skills needed for reducing stress and depression.  Initial Review & Psychosocial Screening: Initial Psych Review & Screening - 03/24/18 1156      Initial Review   Current issues with  None Identified  Family Dynamics   Good Support System?  Yes      Barriers   Psychosocial barriers to participate in program  There are no identifiable barriers or psychosocial needs.      Screening Interventions   Interventions  Encouraged to exercise       Quality of Life Scores: Quality of Life - 03/24/18 1109      Quality of Life Scores   Health/Function Pre  24.68 %    Socioeconomic Pre  29 %    Psych/Spiritual Pre  30 %    Family Pre  24.8 %    GLOBAL Pre  26.67 %      Scores of 19 and below usually indicate a poorer quality of life in these areas.  A difference of  2-3 points is a clinically meaningful difference.  A difference of 2-3 points in the total score of the Quality of Life Index has been associated with significant improvement in overall quality of life, self-image, physical symptoms, and general health in studies assessing change in quality of life.  PHQ-9: Recent Review Flowsheet Data    Depression screen [facility] 2/9 01/12/2018 12/15/2017 10/13/2017 05/01/2017 05/01/2016   Decreased Interest 0 0 0 0 0   Down, Depressed, Hopeless 0 0 0 0 0   PHQ - 2 Score 0 0 0 0 0   Altered sleeping - - - 0 -   Tired, decreased energy - - - 0 -   Change in appetite - - - 0 -   Feeling bad or failure about yourself  - - - 0 -   Trouble concentrating - - - 0 -   Moving slowly or fidgety/restless - - - 0 -   Suicidal thoughts - - - 0 -   PHQ-9 Score - - - 0 -   Difficult doing work/chores - - - Not difficult at all -     Interpretation of Total Score  Total Score Depression Severity:  1-4 = Minimal  depression, 5-9 = Mild depression, 10-14 = Moderate depression, 15-19 = Moderately severe depression, 20-27 = Severe depression   Psychosocial Evaluation and Intervention:   Psychosocial Re-Evaluation:   Psychosocial Discharge (Final Psychosocial Re-Evaluation):   Vocational Rehabilitation: Provide vocational rehab assistance to qualifying candidates.   Vocational Rehab Evaluation & Intervention: Vocational Rehab - 03/24/18 1154      Initial Vocational Rehab Evaluation & Intervention   Assessment shows need for Vocational Rehabilitation  No Mr Gabay is retired and does not need voational rehab at this time       Education: Education Goals: Education classes will be provided on a weekly basis, covering required topics. Participant will state understanding/return demonstration of topics presented.  Learning Barriers/Preferences: Learning Barriers/Preferences - 03/24/18 0845      Learning Barriers/Preferences   Learning Barriers  Sight    Learning Preferences  Written Material;Skilled Demonstration       Education Topics: Count Your Pulse:  -Group instruction provided by verbal instruction, demonstration, patient participation and written materials to support subject.  Instructors address importance of being able to find your pulse and how to count your pulse when at home without a heart monitor.  Patients get hands on experience counting their pulse with staff help and individually.   Heart Attack, Angina, and Risk Factor Modification:  -Group instruction provided by verbal instruction, video, and written materials to support subject.  Instructors address signs and symptoms of angina and heart attacks.    Also discuss risk factors  for heart disease and how to make changes to improve heart health risk factors.   Functional Fitness:  -Group instruction provided by verbal instruction, demonstration, patient participation, and written materials to support subject.  Instructors  address safety measures for doing things around the house.  Discuss how to get up and down off the floor, how to pick things up properly, how to safely get out of a chair without assistance, and balance training.   Meditation and Mindfulness:  -Group instruction provided by verbal instruction, patient participation, and written materials to support subject.  Instructor addresses importance of mindfulness and meditation practice to help reduce stress and improve awareness.  Instructor also leads participants through a meditation exercise.    Stretching for Flexibility and Mobility:  -Group instruction provided by verbal instruction, patient participation, and written materials to support subject.  Instructors lead participants through series of stretches that are designed to increase flexibility thus improving mobility.  These stretches are additional exercise for major muscle groups that are typically performed during regular warm up and cool down.   Hands Only CPR:  -Group verbal, video, and participation provides a basic overview of AHA guidelines for community CPR. Role-play of emergencies allow participants the opportunity to practice calling for help and chest compression technique with discussion of AED use.   Hypertension: -Group verbal and written instruction that provides a basic overview of hypertension including the most recent diagnostic guidelines, risk factor reduction with self-care instructions and medication management.    Nutrition I class: Heart Healthy Eating:  -Group instruction provided by PowerPoint slides, verbal discussion, and written materials to support subject matter. The instructor gives an explanation and review of the Therapeutic Lifestyle Changes diet recommendations, which includes a discussion on lipid goals, dietary fat, sodium, fiber, plant stanol/sterol esters, sugar, and the components of a well-balanced, healthy diet.   Nutrition II class: Lifestyle  Skills:  -Group instruction provided by PowerPoint slides, verbal discussion, and written materials to support subject matter. The instructor gives an explanation and review of label reading, grocery shopping for heart health, heart healthy recipe modifications, and ways to make healthier choices when eating out.   Diabetes Question & Answer:  -Group instruction provided by PowerPoint slides, verbal discussion, and written materials to support subject matter. The instructor gives an explanation and review of diabetes co-morbidities, pre- and post-prandial blood glucose goals, pre-exercise blood glucose goals, signs, symptoms, and treatment of hypoglycemia and hyperglycemia, and foot care basics.   Diabetes Blitz:  -Group instruction provided by PowerPoint slides, verbal discussion, and written materials to support subject matter. The instructor gives an explanation and review of the physiology behind type 1 and type 2 diabetes, diabetes medications and rational behind using different medications, pre- and post-prandial blood glucose recommendations and Hemoglobin A1c goals, diabetes diet, and exercise including blood glucose guidelines for exercising safely.    Portion Distortion:  -Group instruction provided by PowerPoint slides, verbal discussion, written materials, and food models to support subject matter. The instructor gives an explanation of serving size versus portion size, changes in portions sizes over the last 20 years, and what consists of a serving from each food group.   Stress Management:  -Group instruction provided by verbal instruction, video, and written materials to support subject matter.  Instructors review role of stress in heart disease and how to cope with stress positively.     Exercising on Your Own:  -Group instruction provided by verbal instruction, power point, and written materials to support subject.  Instructors discuss benefits of exercise, components of  exercise, frequency and intensity of exercise, and end points for exercise.  Also discuss use of nitroglycerin and activating EMS.  Review options of places to exercise outside of rehab.  Review guidelines for sex with heart disease.   Cardiac Drugs I:  -Group instruction provided by verbal instruction and written materials to support subject.  Instructor reviews cardiac drug classes: antiplatelets, anticoagulants, beta blockers, and statins.  Instructor discusses reasons, side effects, and lifestyle considerations for each drug class.   Cardiac Drugs II:  -Group instruction provided by verbal instruction and written materials to support subject.  Instructor reviews cardiac drug classes: angiotensin converting enzyme inhibitors (ACE-I), angiotensin II receptor blockers (ARBs), nitrates, and calcium channel blockers.  Instructor discusses reasons, side effects, and lifestyle considerations for each drug class.   Anatomy and Physiology of the Circulatory System:  Group verbal and written instruction and models provide basic cardiac anatomy and physiology, with the coronary electrical and arterial systems. Review of: AMI, Angina, Valve disease, Heart Failure, Peripheral Artery Disease, Cardiac Arrhythmia, Pacemakers, and the ICD.   Other Education:  -Group or individual verbal, written, or video instructions that support the educational goals of the cardiac rehab program.   Holiday Eating Survival Tips:  -Group instruction provided by PowerPoint slides, verbal discussion, and written materials to support subject matter. The instructor gives patients tips, tricks, and techniques to help them not only survive but enjoy the holidays despite the onslaught of food that accompanies the holidays.   Knowledge Questionnaire Score: Knowledge Questionnaire Score - 03/24/18 0846      Knowledge Questionnaire Score   Pre Score  17/24       Core Components/Risk Factors/Patient Goals at  Admission: Personal Goals and Risk Factors at Admission - 03/24/18 0850      Core Components/Risk Factors/Patient Goals on Admission    Weight Management  Yes;Obesity    Intervention  Weight Management: Develop a combined nutrition and exercise program designed to reach desired caloric intake, while maintaining appropriate intake of nutrient and fiber, sodium and fats, and appropriate energy expenditure required for the weight goal.;Weight Management: Provide education and appropriate resources to help participant work on and attain dietary goals.;Weight Management/Obesity: Establish reasonable short term and long term weight goals.;Obesity: Provide education and appropriate resources to help participant work on and attain dietary goals.    Admit Weight  248 lb 14.4 oz (112.9 kg)    Goal Weight: Short Term  238 lb (108 kg)    Goal Weight: Long Term  228 lb (103.4 kg)    Expected Outcomes  Short Term: Continue to assess and modify interventions until short term weight is achieved;Weight Maintenance: Understanding of the daily nutrition guidelines, which includes 25-35% calories from fat, 7% or less cal from saturated fats, less than 200mg  cholesterol, less than 1.5gm of sodium, & 5 or more servings of fruits and vegetables daily;Weight Loss: Understanding of general recommendations for a balanced deficit meal plan, which promotes 1-2 lb weight loss per week and includes a negative energy balance of [phone] kcal/d;Understanding recommendations for meals to include 15-35% energy as protein, 25-35% energy from fat, 35-60% energy from carbohydrates, less than 200mg  of dietary cholesterol, 20-35 gm of total fiber daily;Understanding of distribution of calorie intake throughout the day with the consumption of 4-5 meals/snacks    Diabetes  Yes    Intervention  Provide education about signs/symptoms and action to take for hypo/hyperglycemia.;Provide education about proper nutrition, including hydration, and  aerobic/resistive exercise prescription along with prescribed medications to achieve blood glucose in normal ranges: Fasting glucose 65-99 mg/dL    Expected Outcomes  Short Term: Participant verbalizes understanding of the signs/symptoms and immediate care of hyper/hypoglycemia, proper foot care and importance of medication, aerobic/resistive exercise and nutrition plan for blood glucose control.;Long Term: Attainment of HbA1C < 7%.    Heart Failure  Yes    Intervention  Provide a combined exercise and nutrition program that is supplemented with education, support and counseling about heart failure. Directed toward relieving symptoms such as shortness of breath, decreased exercise tolerance, and extremity edema.    Expected Outcomes  Improve functional capacity of life;Short term: Attendance in program 2-3 days a week with increased exercise capacity. Reported lower sodium intake. Reported increased fruit and vegetable intake. Reports medication compliance.;Short term: Daily weights obtained and reported for increase. Utilizing diuretic protocols set by physician.;Long term: Adoption of self-care skills and reduction of barriers for early signs and symptoms recognition and intervention leading to self-care maintenance.    Hypertension  Yes    Intervention  Provide education on lifestyle modifcations including regular physical activity/exercise, weight management, moderate sodium restriction and increased consumption of fresh fruit, vegetables, and low fat dairy, alcohol moderation, and smoking cessation.;Monitor prescription use compliance.    Expected Outcomes  Short Term: Continued assessment and intervention until BP is < 140/86mm HG in hypertensive participants. < 130/53mm HG in hypertensive participants with diabetes, heart failure or chronic kidney disease.;Long Term: Maintenance of blood pressure at goal levels.    Lipids  Yes    Intervention  Provide education and support for participant on  nutrition & aerobic/resistive exercise along with prescribed medications to achieve LDL 70mg , HDL >40mg .    Expected Outcomes  Short Term: Participant states understanding of desired cholesterol values and is compliant with medications prescribed. Participant is following exercise prescription and nutrition guidelines.;Long Term: Cholesterol controlled with medications as prescribed, with individualized exercise RX and with personalized nutrition plan. Value goals: LDL < 70mg , HDL > 40 mg.       Core Components/Risk Factors/Patient Goals Review:    Core Components/Risk Factors/Patient Goals at Discharge (Final Review):    ITP Comments: ITP Comments    Row Name 03/24/18 0844           ITP Comments  Dr. Fransico Him, Medical Director          Comments: Patient attended orientation from [place] to [phone] to review rules and guidelines for program. Completed 6 minute walk test and used a cart for stability as the patient is somewhat deconditioned. Sanad took a rest break for one minute and 20 seconds then completed his walk test., Intitial ITP, and exercise prescription.  VSS. Telemetry-V paced. Hartman complained about neuropathy foot pain during the walk test.Deja Pisarski Venetia Maxon, RN,BSN 03/24/2018 12:20 PM

## 2018-03-24 NOTE — Progress Notes (Signed)
Steven Maldonado 77 y.o. male DOB: [birth year] MRN: [id number]      Nutrition Note  1. 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 Chronic diastolic CHF (congestive heart failure) ([place])   . CKD (chronic kidney disease), stage III ([place])   . Coronary artery disease    a. Cath February 2012 in [facility], occluded RCA with collaterals  . DM type 2 (diabetes mellitus, type 2) ([place])   . Essential hypertension   . Hyperlipidemia   . Pacemaker    a. symptomatic brady after TAVR s/p MDT PPM by Dr. Curt Bears 12/04/17  . Persistent atrial fibrillation ([place])   . PVD (peripheral vascular disease) ([place])    a. s/p R popliteal artery stenosis tx with drug-coated balloon 05/2014, followed by Dr. Fletcher Anon.  . 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 Severe aortic stenosis    a. 12/02/17: s/p TAVR  . Skin cancer   . Sleep apnea with use of continuous positive airway pressure (CPAP)    04-11-11 AHI was 32.9 and titrated to 15 cm H20, DME is [facility]  . Subclinical hypothyroidism    Meds reviewed. Glipizide, Humalog, Glargine noted  HT: Ht Readings from Last 1 Encounters:  03/24/18 5' 10.5" (1.791 m)    WT: Wt Readings from Last 5 Encounters:  03/24/18 248 lb 14.4 oz (112.9 kg)  03/04/18 251 lb 6.4 oz (114 kg)  02/04/18 252 lb 12.8 oz (114.7 kg)  02/03/18 254 lb (115.2 kg)  01/12/18 251 lb 12.8 oz (114.2 kg)     Body mass index is 35.21 kg/m.   Current tobacco use? No   Labs:  Lipid Panel     Component Value Date/Time   CHOL 155 10/13/2017 1224   TRIG 144 10/13/2017 1224   HDL 38 (L) 10/13/2017 1224   CHOLHDL 4.1 10/13/2017 1224   VLDL 32 (H) 05/01/2017 1216   LDLCALC 93 10/13/2017 1224    Lab Results  Component Value Date   HGBA1C 8.1 (H) 11/28/2017   CBG (last 3)  No results for input(s): GLUCAP in the last  72 hours.  Nutrition Note Spoke with pt. Nutrition plan and goals reviewed with pt. Pt is following Step 2 of the Therapeutic Lifestyle Changes diet. Pt wants to lose wt. Pt has not been actively trying to lose wt. Wt loss tips reviewed. Pt is diabetic. Last A1c indicates blood glucose not optimally controlled. Per discussion, pt is educated re: DM diet. Pt states he has seen several RD's over the years and is quite familiar with the food pyramid and DM diet. Pt expressed understanding of the information reviewed. Pt aware of nutrition education classes offered.  Nutrition Diagnosis ? Food-and nutrition-related knowledge deficit related to lack of exposure to information as related to diagnosis of: ? CVD ? DM  ? Obesity related to excessive energy intake as evidenced by a Body mass index is 35.21 kg/m.  Nutrition Intervention ? Pt's individual nutrition plan and goals reviewed with pt.  Nutrition Goal(s):  ? Pt to identify food quantities necessary to achieve weight loss of 6-24 lb (2.7-10.9 kg) at graduation from cardiac rehab. ? Improved blood glucose control as evidenced by pt's A1c trending from 8.1 toward 7.5 (due to neuropathy) or less.  Plan:  Pt to attend nutrition classes ? Nutrition I ? Nutrition II ?  Portion Distortion ? Diabetes Blitz ? Diabetes Q & A Will provide client-centered nutrition education as part of interdisciplinary care.   Monitor and evaluate progress toward nutrition goal with team.  Derek Mound, M.Ed, RD, LDN, CDE 03/24/2018 12:14 PM

## 2018-03-27 ENCOUNTER — Other Ambulatory Visit: Payer: Self-pay

## 2018-03-27 ENCOUNTER — Encounter: Payer: Self-pay | Admitting: Family Medicine

## 2018-03-27 ENCOUNTER — Ambulatory Visit (INDEPENDENT_AMBULATORY_CARE_PROVIDER_SITE_OTHER): Payer: Medicare Other | Admitting: Family Medicine

## 2018-03-27 VITALS — BP 128/64 | HR 90 | Temp 98.6°F | Ht 71.0 in | Wt 251.1 lb

## 2018-03-27 DIAGNOSIS — L03031 Cellulitis of right toe: Secondary | ICD-10-CM

## 2018-03-27 DIAGNOSIS — I6523 Occlusion and stenosis of bilateral carotid arteries: Secondary | ICD-10-CM | POA: Diagnosis not present

## 2018-03-27 MED ORDER — DOXYCYCLINE HYCLATE 100 MG PO TABS: 100.0000 mg | ORAL_TABLET | Freq: Two times a day (BID) | ORAL | 0 refills | Status: DC

## 2018-03-27 NOTE — Progress Notes (Signed)
Patient ID: MAREK NGHIEM, male    DOB: [birth year], 77 y.o.   MRN: [id number]  PCP: [name], [name]  Chief Complaint  Patient presents with  . Toe Pain    Redness to 2nd right toe.    Subjective:   TIMO HARTWIG is a 77 y.o. male, with PMHX of DM, PAF, PAD, HTN, HLD, presents to clinic with CC of redness and swelling to right 2nd toe, dorsal aspect, like a blister just below toenail.  He has had similar before and was treated with Abx.  He states it is not painful, but is swollen from normal.  No scratch to the area, no drainage or known open skin.  He likes to tie his shoes very loosely and he believes it may be rubbing a little bit inside his tennis shoes.  No radiation of redness up foot or leg, no other areas of swelling.     Patient Active Problem List   Diagnosis Date Noted  . PAF (paroxysmal atrial fibrillation) ([place]) 12/05/2017  . Pacemaker   . Symptomatic bradycardia   . Asystole ([place])   . 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 Severe aortic stenosis   . Essential hypertension 11/14/2015  . PAD (peripheral artery disease) ([place]) 05/24/2014  . Diabetes ([place]) 10/06/2013  . Chronic kidney disease 08/30/2013  . Hypothyroidism 08/30/2013  . Obesity (BMI 30-39.9) 07/15/2013  . OSA on CPAP 07/15/2013  . Diabetes mellitus type 2, uncomplicated ([place])   . Hyperlipidemia 01/10/2011  . CAD (coronary artery disease) 01/10/2011     Prior to Admission medications   Medication Sig Start Date End Date Taking? Authorizing Provider  amiodarone (PACERONE) 200 MG tablet Take 1 tablet (200 mg total) by mouth daily. 11/06/17  Yes Burnell Blanks, MD  aspirin EC 81 MG tablet Take 81 mg by mouth daily.   Yes [provider]  atorvastatin (LIPITOR) 80 MG tablet Take 80 mg by mouth at bedtime.   Yes [provider]  Blood Glucose Monitoring Suppl (FREESTYLE LITE) DEVI 1 each by Does not apply route 2 (two) times daily. 10/20/13  Yes  [name], [name]  Choline Fenofibrate 135 MG capsule Take 135 mg by mouth daily.   Yes [provider]  CINNAMON PO Take 1 tablet by mouth 2 (two) times daily.   Yes [provider]  dabigatran (PRADAXA) 150 MG CAPS capsule Take 150 mg by mouth every 12 (twelve) hours.   Yes [provider]  diphenhydramine-acetaminophen (TYLENOL PM) 25-500 MG TABS tablet Take 1 tablet by mouth at bedtime as needed (sleep).   Yes [provider]  docusate sodium (COLACE) 100 MG capsule Take 100 mg daily by mouth.  05/06/13  Yes Dixon, Lonie Peak, [name]  fesoterodine (TOVIAZ) 4 MG TB24 tablet Take 4 mg by mouth daily.   Yes [provider]  furosemide (LASIX) 20 MG tablet Take 1 tablet (20 mg total) by mouth daily. 09/02/17  Yes Dena Billet B, [name]  gabapentin (NEURONTIN) 300 MG capsule Take 1 capsule (300 mg total) by mouth 3 (three) times daily. 12/25/17  Yes Dena Billet B, [name]  glipiZIDE (GLUCOTROL XL) 10 MG 24 hr tablet TAKE 1 TABLET (10 MG TOTAL) BY MOUTH DAILY WITH BREAKFAST. 02/02/18  Yes Dixon, Mary B, [name]  glucose blood (FREESTYLE LITE) test strip 1 each by Other route 4 (four) times daily -  before meals and at bedtime. 06/27/16  Yes [name], [name]  HUMALOG KWIKPEN 100 UNIT/ML KiwkPen INJECT 5 UNITS             SUBCUTANEOUSLY 3 TIMES A   DAY 02/11/18  Yes Dena Billet B, [name]  Insulin Glargine (BASAGLAR KWIKPEN) 100 UNIT/ML SOPN Inject 0.65 mLs (65 Units total) into the skin at bedtime. 05/06/17  Yes Dena Billet B, [name]  insulin lispro (HUMALOG) 100 UNIT/ML KiwkPen Inject 0.05 mLs (5 Units total) into the skin 3 (three) times daily. 06/18/17  Yes Dena Billet B, [name]  Insulin Pen Needle (BD PEN NEEDLE NANO U/F) 32G X 4 MM MISC Use as directed four (4) times daily.   Yes [provider]  levothyroxine (SYNTHROID, LEVOTHROID) 150 MCG tablet TAKE 1 TABLET (150 MCG TOTAL) BY MOUTH DAILY. TAKE FOR 3 WEEKS THEN FOLLOW UP WITH PROVIDER 03/23/18  Yes Dena Billet B, [name]    magnesium oxide (MAG-OX) 400 MG tablet Take 400 mg by mouth daily.   Yes [provider]  metoprolol tartrate (LOPRESSOR) 50 MG tablet Take 75 mg by mouth 2 (two) times daily.   Yes [provider]  mirabegron ER (MYRBETRIQ) 25 MG TB24 tablet Take 25 mg by mouth daily.   Yes [provider]  doxycycline (VIBRA-TABS) 100 MG tablet Take 1 tablet (100 mg total) by mouth 2 (two) times daily. 03/27/18   Delsa Grana, [name]     No Known Allergies   Family History  Problem Relation Age of Onset  . Diabetes Mother   . Heart attack Mother   . Hypertension Mother   . Heart attack Father   . Heart failure Father   . Hypertension Father   . Diabetes Father   . Diabetes Sister   . Diabetes Brother   . Diabetes Other   . Diabetes Daughter        TYPE ll  . Heart Problems Daughter   . Hypertension Sister   . Hypertension Brother   . Stroke Brother      Social History   Socioeconomic History  . Marital status: Married    Spouse name: [name]  . Number of children: 1  . Years of education: 39  . Highest education level: Not on file  Occupational History  . Occupation: retired    Fish farm manager: [employer]  . Financial resource strain: Not on file  . Food insecurity:    Worry: Not on file    Inability: Not on file  . Transportation needs:    Medical: Not on file    Non-medical: Not on file  Tobacco Use  . Smoking status: Former Smoker    Packs/day: 2.00    Years: 14.00    Pack years: 28.00    Types: Cigarettes    Last attempt to quit: 11/11/1972    Years since quitting: 45.4  . Smokeless tobacco: Never Used  Substance and Sexual Activity  . Alcohol use: No    Comment: quit in 1984  . Drug use: No  . Sexual activity: Not Currently  Lifestyle  . Physical activity:    Days per week: 0 days    Minutes per session: 0 min  . Stress: Not at all  Relationships  . Social connections:    Talks on phone: Not on file    Gets together:  Not on file    Attends religious service: Not on file    Active member of club or organization: Not on file    Attends meetings of clubs or organizations: Not  on file    Relationship status: Not on file  . Intimate partner violence:    Fear of current or ex partner: Not on file    Emotionally abused: Not on file    Physically abused: Not on file    Forced sexual activity: Not on file  Other Topics Concern  . 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   Patient has one child and his wife has one child.   Patient is retired.   Patient has a high school education.   Patient is right-handed.   Patient drinks very little caffeine.     Review of Systems  All other systems reviewed and are negative.      Objective:    Vitals:   03/27/18 1552  BP: 128/64  Pulse: 90  Temp: 98.6 F (37 C)  TempSrc: Oral  SpO2: 93%  Weight: 251 lb 2 oz (113.9 kg)  Height: 5\' 11"  (1.803 m)      Physical Exam  Constitutional: He appears well-developed and well-nourished.  HENT:  Head: Normocephalic and atraumatic.  Nose: Nose normal.  Eyes: Conjunctivae are normal. Right eye exhibits no discharge. Left eye exhibits no discharge.  Neck: No tracheal deviation present.  Cardiovascular: Normal rate and regular rhythm.  Pulmonary/Chest: Effort normal. No stridor. No respiratory distress.  Musculoskeletal: Normal range of motion. He exhibits no tenderness.       Right foot: There is normal range of motion and no deformity.  Feet:  Right Foot:  Skin Integrity: Positive for blister and erythema (to distal right 2nd toe, proximal of toenail, but distal half of toe). Negative for ulcer, skin breakdown or warmth.  Neurological: He is alert. He exhibits normal muscle tone. Coordination normal.  Skin: Skin is warm and dry. No rash noted. There is erythema.  Psychiatric: He has a normal mood and affect. His behavior is normal.  Nursing note  and vitals reviewed.         Assessment & Plan:      ICD-10-CM   1. Cellulitis of right toe L03.031   2. Cellulitis of second toe, right L03.031 doxycycline (VIBRA-TABS) 100 MG tablet     Pt with redness to right second toe, has occurred before, treated aggressively with abx and resolved.  He is high risk with PAD and DM.  Will cover with doxy. Told him to start the antibiotic immediately and take as directed. He is to look at his foot at least once a day--if site worsens at all and follow-up immediately. If site does not resolve upon completion of antibiotics then follow-up.  F/up as needed    Delsa Grana, [name] 03/27/18 4:32 PM

## 2018-03-30 ENCOUNTER — Ambulatory Visit (HOSPITAL_COMMUNITY): Payer: Medicare Other

## 2018-03-30 ENCOUNTER — Encounter (HOSPITAL_COMMUNITY): Payer: Self-pay

## 2018-04-01 ENCOUNTER — Ambulatory Visit (HOSPITAL_COMMUNITY): Payer: Medicare Other

## 2018-04-03 ENCOUNTER — Ambulatory Visit (HOSPITAL_COMMUNITY): Payer: Medicare Other

## 2018-04-03 ENCOUNTER — Emergency Department (HOSPITAL_COMMUNITY): Payer: Medicare Other

## 2018-04-03 ENCOUNTER — Inpatient Hospital Stay (HOSPITAL_COMMUNITY)
Admission: EM | Admit: 2018-04-03 | Discharge: 2018-04-07 | DRG: 300 | Disposition: A | Discharge status: Home / Self Care | Payer: Medicare Other | Attending: Internal Medicine | Admitting: Internal Medicine

## 2018-04-03 ENCOUNTER — Other Ambulatory Visit: Payer: Self-pay

## 2018-04-03 DIAGNOSIS — E1165 Type 2 diabetes mellitus with hyperglycemia: Secondary | ICD-10-CM | POA: Diagnosis present

## 2018-04-03 DIAGNOSIS — E1122 Type 2 diabetes mellitus with diabetic chronic kidney disease: Secondary | ICD-10-CM | POA: Diagnosis present

## 2018-04-03 DIAGNOSIS — I5032 Chronic diastolic (congestive) heart failure: Secondary | ICD-10-CM | POA: Diagnosis present

## 2018-04-03 DIAGNOSIS — I152 Hypertension secondary to endocrine disorders: Secondary | ICD-10-CM | POA: Diagnosis present

## 2018-04-03 DIAGNOSIS — Z952 Presence of prosthetic heart valve: Secondary | ICD-10-CM

## 2018-04-03 DIAGNOSIS — I2510 Atherosclerotic heart disease of native coronary artery without angina pectoris: Secondary | ICD-10-CM | POA: Diagnosis present

## 2018-04-03 DIAGNOSIS — L03119 Cellulitis of unspecified part of limb: Secondary | ICD-10-CM

## 2018-04-03 DIAGNOSIS — Z950 Presence of cardiac pacemaker: Secondary | ICD-10-CM | POA: Diagnosis not present

## 2018-04-03 DIAGNOSIS — Z8249 Family history of ischemic heart disease and other diseases of the circulatory system: Secondary | ICD-10-CM

## 2018-04-03 DIAGNOSIS — M79676 Pain in unspecified toe(s): Secondary | ICD-10-CM | POA: Diagnosis not present

## 2018-04-03 DIAGNOSIS — I481 Persistent atrial fibrillation: Secondary | ICD-10-CM | POA: Diagnosis present

## 2018-04-03 DIAGNOSIS — Z9861 Coronary angioplasty status: Secondary | ICD-10-CM

## 2018-04-03 DIAGNOSIS — E1142 Type 2 diabetes mellitus with diabetic polyneuropathy: Secondary | ICD-10-CM

## 2018-04-03 DIAGNOSIS — Z794 Long term (current) use of insulin: Secondary | ICD-10-CM | POA: Diagnosis not present

## 2018-04-03 DIAGNOSIS — I10 Essential (primary) hypertension: Secondary | ICD-10-CM | POA: Diagnosis not present

## 2018-04-03 DIAGNOSIS — Z85828 Personal history of other malignant neoplasm of skin: Secondary | ICD-10-CM

## 2018-04-03 DIAGNOSIS — Z953 Presence of xenogenic heart valve: Secondary | ICD-10-CM | POA: Diagnosis not present

## 2018-04-03 DIAGNOSIS — F329 Major depressive disorder, single episode, unspecified: Secondary | ICD-10-CM | POA: Diagnosis present

## 2018-04-03 DIAGNOSIS — E039 Hypothyroidism, unspecified: Secondary | ICD-10-CM | POA: Diagnosis not present

## 2018-04-03 DIAGNOSIS — I96 Gangrene, not elsewhere classified: Secondary | ICD-10-CM | POA: Diagnosis not present

## 2018-04-03 DIAGNOSIS — E1152 Type 2 diabetes mellitus with diabetic peripheral angiopathy with gangrene: Principal | ICD-10-CM | POA: Diagnosis present

## 2018-04-03 DIAGNOSIS — L03031 Cellulitis of right toe: Secondary | ICD-10-CM | POA: Diagnosis present

## 2018-04-03 DIAGNOSIS — I130 Hypertensive heart and chronic kidney disease with heart failure and stage 1 through stage 4 chronic kidney disease, or unspecified chronic kidney disease: Secondary | ICD-10-CM | POA: Diagnosis present

## 2018-04-03 DIAGNOSIS — E11628 Type 2 diabetes mellitus with other skin complications: Secondary | ICD-10-CM | POA: Diagnosis not present

## 2018-04-03 DIAGNOSIS — Z87891 Personal history of nicotine dependence: Secondary | ICD-10-CM | POA: Diagnosis not present

## 2018-04-03 DIAGNOSIS — I998 Other disorder of circulatory system: Secondary | ICD-10-CM

## 2018-04-03 DIAGNOSIS — Z7902 Long term (current) use of antithrombotics/antiplatelets: Secondary | ICD-10-CM | POA: Diagnosis not present

## 2018-04-03 DIAGNOSIS — I480 Paroxysmal atrial fibrillation: Secondary | ICD-10-CM | POA: Diagnosis not present

## 2018-04-03 DIAGNOSIS — Z833 Family history of diabetes mellitus: Secondary | ICD-10-CM

## 2018-04-03 DIAGNOSIS — I739 Peripheral vascular disease, unspecified: Secondary | ICD-10-CM | POA: Diagnosis present

## 2018-04-03 DIAGNOSIS — Z793 Long term (current) use of hormonal contraceptives: Secondary | ICD-10-CM

## 2018-04-03 DIAGNOSIS — Z95820 Peripheral vascular angioplasty status with implants and grafts: Secondary | ICD-10-CM

## 2018-04-03 DIAGNOSIS — N183 Chronic kidney disease, stage 3 (moderate): Secondary | ICD-10-CM

## 2018-04-03 DIAGNOSIS — N189 Chronic kidney disease, unspecified: Secondary | ICD-10-CM | POA: Diagnosis present

## 2018-04-03 DIAGNOSIS — Z9989 Dependence on other enabling machines and devices: Secondary | ICD-10-CM

## 2018-04-03 DIAGNOSIS — G4733 Obstructive sleep apnea (adult) (pediatric): Secondary | ICD-10-CM | POA: Diagnosis not present

## 2018-04-03 DIAGNOSIS — S99922A Unspecified injury of left foot, initial encounter: Secondary | ICD-10-CM | POA: Insufficient documentation

## 2018-04-03 DIAGNOSIS — Z7901 Long term (current) use of anticoagulants: Secondary | ICD-10-CM | POA: Diagnosis not present

## 2018-04-03 DIAGNOSIS — E1159 Type 2 diabetes mellitus with other circulatory complications: Secondary | ICD-10-CM | POA: Diagnosis present

## 2018-04-03 DIAGNOSIS — E119 Type 2 diabetes mellitus without complications: Secondary | ICD-10-CM

## 2018-04-03 DIAGNOSIS — E785 Hyperlipidemia, unspecified: Secondary | ICD-10-CM | POA: Diagnosis present

## 2018-04-03 DIAGNOSIS — Z7982 Long term (current) use of aspirin: Secondary | ICD-10-CM

## 2018-04-03 DIAGNOSIS — M7989 Other specified soft tissue disorders: Secondary | ICD-10-CM | POA: Diagnosis not present

## 2018-04-03 DIAGNOSIS — Z79899 Other long term (current) drug therapy: Secondary | ICD-10-CM

## 2018-04-03 DIAGNOSIS — Z823 Family history of stroke: Secondary | ICD-10-CM

## 2018-04-03 DIAGNOSIS — M79672 Pain in left foot: Secondary | ICD-10-CM | POA: Diagnosis not present

## 2018-04-03 DIAGNOSIS — R030 Elevated blood-pressure reading, without diagnosis of hypertension: Secondary | ICD-10-CM | POA: Diagnosis not present

## 2018-04-03 DIAGNOSIS — I350 Nonrheumatic aortic (valve) stenosis: Secondary | ICD-10-CM

## 2018-04-03 LAB — CBC
HCT: 36.3 % — ABNORMAL LOW (ref 39.0–52.0)
Hemoglobin: 12.1 g/dL — ABNORMAL LOW (ref 13.0–17.0)
MCH: 30.6 pg (ref 26.0–34.0)
MCHC: 33.3 g/dL (ref 30.0–36.0)
MCV: 91.9 fL (ref 78.0–100.0)
Platelets: 185 10*3/uL (ref 150–400)
RBC: 3.95 MIL/uL — ABNORMAL LOW (ref 4.22–5.81)
RDW: 13 % (ref 11.5–15.5)
WBC: 7.9 10*3/uL (ref 4.0–10.5)

## 2018-04-03 LAB — SEDIMENTATION RATE: Sed Rate: 52 mm/hr — ABNORMAL HIGH (ref 0–16)

## 2018-04-03 LAB — I-STAT CHEM 8, ED
BUN: 23 mg/dL — ABNORMAL HIGH (ref 6–20)
Calcium, Ion: 1.14 mmol/L — ABNORMAL LOW (ref 1.15–1.40)
Chloride: 102 mmol/L (ref 101–111)
Creatinine, Ser: 1.4 mg/dL — ABNORMAL HIGH (ref 0.61–1.24)
Glucose, Bld: 248 mg/dL — ABNORMAL HIGH (ref 65–99)
HCT: 36 % — ABNORMAL LOW (ref 39.0–52.0)
Hemoglobin: 12.2 g/dL — ABNORMAL LOW (ref 13.0–17.0)
Potassium: 4.4 mmol/L (ref 3.5–5.1)
Sodium: 138 mmol/L (ref 135–145)
TCO2: 26 mmol/L (ref 22–32)

## 2018-04-03 LAB — COMPREHENSIVE METABOLIC PANEL
ALT: 19 U/L (ref 17–63)
AST: 18 U/L (ref 15–41)
Albumin: 3.4 g/dL — ABNORMAL LOW (ref 3.5–5.0)
Alkaline Phosphatase: 47 U/L (ref 38–126)
Anion gap: 9 (ref 5–15)
BUN: 23 mg/dL — ABNORMAL HIGH (ref 6–20)
CO2: 26 mmol/L (ref 22–32)
Calcium: 9.2 mg/dL (ref 8.9–10.3)
Chloride: 102 mmol/L (ref 101–111)
Creatinine, Ser: 1.4 mg/dL — ABNORMAL HIGH (ref 0.61–1.24)
GFR calc Af Amer: 56 mL/min — ABNORMAL LOW (ref >60)
GFR calc non Af Amer: 48 mL/min — ABNORMAL LOW (ref >60)
Glucose, Bld: 253 mg/dL — ABNORMAL HIGH (ref 65–99)
Potassium: 4.5 mmol/L (ref 3.5–5.1)
Sodium: 137 mmol/L (ref 135–145)
Total Bilirubin: 0.2 mg/dL — ABNORMAL LOW (ref 0.3–1.2)
Total Protein: 7.1 g/dL (ref 6.5–8.1)

## 2018-04-03 LAB — APTT: aPTT: 42 seconds — ABNORMAL HIGH (ref 24–36)

## 2018-04-03 LAB — PROTIME-INR
INR: 1.29
Prothrombin Time: 16 seconds — ABNORMAL HIGH (ref 11.4–15.2)

## 2018-04-03 MED ORDER — METRONIDAZOLE IN NACL 5-0.79 MG/ML-% IV SOLN
500.0000 mg | Freq: Three times a day (TID) | INTRAVENOUS | Status: DC
  Filled 2018-04-03: qty 100

## 2018-04-03 MED ORDER — METOPROLOL TARTRATE 50 MG PO TABS
75.0000 mg | ORAL_TABLET | Freq: Two times a day (BID) | ORAL | Status: DC
  Administered 2018-04-03 – 2018-04-07 (×8): 75 mg via ORAL
  Filled 2018-04-03: qty 3
  Filled 2018-04-03 (×9): qty 1

## 2018-04-03 MED ORDER — ATORVASTATIN CALCIUM 80 MG PO TABS
80.0000 mg | ORAL_TABLET | Freq: Every day | ORAL | Status: DC
  Administered 2018-04-03 – 2018-04-06 (×4): 80 mg via ORAL
  Filled 2018-04-03 (×4): qty 1

## 2018-04-03 MED ORDER — VANCOMYCIN HCL 10 G IV SOLR
1250.0000 mg | INTRAVENOUS | Status: DC
  Administered 2018-04-04 – 2018-04-06 (×2): 1250 mg via INTRAVENOUS
  Filled 2018-04-03 (×2): qty 1250

## 2018-04-03 MED ORDER — VANCOMYCIN HCL 10 G IV SOLR
2000.0000 mg | Freq: Once | INTRAVENOUS | Status: AC
Start: 2018-04-03 — End: 2018-04-04
  Administered 2018-04-03: 2000 mg via INTRAVENOUS
  Filled 2018-04-03: qty 2000

## 2018-04-03 MED ORDER — GABAPENTIN 300 MG PO CAPS
300.0000 mg | ORAL_CAPSULE | Freq: Three times a day (TID) | ORAL | Status: DC
  Administered 2018-04-03 – 2018-04-07 (×11): 300 mg via ORAL
  Filled 2018-04-03 (×11): qty 1

## 2018-04-03 MED ORDER — ALBUTEROL SULFATE (2.5 MG/3ML) 0.083% IN NEBU: 2.5000 mg | INHALATION_SOLUTION | Freq: Four times a day (QID) | RESPIRATORY_TRACT | Status: DC | PRN

## 2018-04-03 MED ORDER — ONDANSETRON HCL 4 MG PO TABS: 4.0000 mg | ORAL_TABLET | Freq: Four times a day (QID) | ORAL | Status: DC | PRN

## 2018-04-03 MED ORDER — ACETAMINOPHEN 325 MG PO TABS
650.0000 mg | ORAL_TABLET | Freq: Four times a day (QID) | ORAL | Status: DC | PRN
  Administered 2018-04-04: 650 mg via ORAL
  Filled 2018-04-03: qty 2

## 2018-04-03 MED ORDER — FENOFIBRATE 160 MG PO TABS
160.0000 mg | ORAL_TABLET | Freq: Every day | ORAL | Status: DC
  Administered 2018-04-04 – 2018-04-07 (×4): 160 mg via ORAL
  Filled 2018-04-03 (×4): qty 1

## 2018-04-03 MED ORDER — HYDROCODONE-ACETAMINOPHEN 5-325 MG PO TABS
1.0000 | ORAL_TABLET | Freq: Four times a day (QID) | ORAL | Status: DC | PRN
  Administered 2018-04-03: 1 via ORAL
  Filled 2018-04-03: qty 1

## 2018-04-03 MED ORDER — ASPIRIN EC 81 MG PO TBEC
81.0000 mg | DELAYED_RELEASE_TABLET | Freq: Every day | ORAL | Status: DC
  Administered 2018-04-04 – 2018-04-07 (×4): 81 mg via ORAL
  Filled 2018-04-03 (×4): qty 1

## 2018-04-03 MED ORDER — SODIUM CHLORIDE 0.9 % IV SOLN
2.0000 g | INTRAVENOUS | Status: DC
  Administered 2018-04-03 – 2018-04-06 (×3): 2 g via INTRAVENOUS
  Filled 2018-04-03 (×3): qty 20

## 2018-04-03 MED ORDER — AMIODARONE HCL 100 MG PO TABS
200.0000 mg | ORAL_TABLET | Freq: Every day | ORAL | Status: DC
  Administered 2018-04-04 – 2018-04-07 (×4): 200 mg via ORAL
  Filled 2018-04-03 (×4): qty 2

## 2018-04-03 MED ORDER — LEVOTHYROXINE SODIUM 75 MCG PO TABS
150.0000 ug | ORAL_TABLET | Freq: Every day | ORAL | Status: DC
Start: 2018-04-04 — End: 2018-04-07
  Administered 2018-04-04 – 2018-04-07 (×4): 150 ug via ORAL
  Filled 2018-04-03 (×3): qty 2
  Filled 2018-04-03: qty 1
  Filled 2018-04-03: qty 2

## 2018-04-03 MED ORDER — ACETAMINOPHEN 650 MG RE SUPP: 650.0000 mg | Freq: Four times a day (QID) | RECTAL | Status: DC | PRN

## 2018-04-03 MED ORDER — DOCUSATE SODIUM 100 MG PO CAPS: 100.0000 mg | ORAL_CAPSULE | Freq: Every day | ORAL | Status: DC | PRN

## 2018-04-03 MED ORDER — INSULIN GLARGINE 100 UNIT/ML ~~LOC~~ SOLN
65.0000 [IU] | Freq: Every day | SUBCUTANEOUS | Status: DC
  Administered 2018-04-03 – 2018-04-06 (×4): 65 [IU] via SUBCUTANEOUS
  Filled 2018-04-03 (×4): qty 0.65

## 2018-04-03 MED ORDER — DIPHENHYDRAMINE-APAP (SLEEP) 25-500 MG PO TABS: 1.0000 | ORAL_TABLET | Freq: Every evening | ORAL | Status: DC | PRN

## 2018-04-03 MED ORDER — INSULIN ASPART 100 UNIT/ML ~~LOC~~ SOLN
0.0000 [IU] | Freq: Three times a day (TID) | SUBCUTANEOUS | Status: DC
  Administered 2018-04-04 – 2018-04-05 (×3): 5 [IU] via SUBCUTANEOUS
  Administered 2018-04-05 – 2018-04-06 (×2): 3 [IU] via SUBCUTANEOUS
  Administered 2018-04-06: 8 [IU] via SUBCUTANEOUS
  Administered 2018-04-06: 3 [IU] via SUBCUTANEOUS
  Administered 2018-04-07: 2 [IU] via SUBCUTANEOUS

## 2018-04-03 MED ORDER — ONDANSETRON HCL 4 MG/2ML IJ SOLN: 4.0000 mg | Freq: Four times a day (QID) | INTRAMUSCULAR | Status: DC | PRN

## 2018-04-03 NOTE — Progress Notes (Signed)
   Daily Progress Note   By report, no evidence of acute threatened limb.  Pt has known PAD as evident by prior interventions by Dr. Fletcher Anon.  I will be tied up in two emergency cases, so will not be able to see the patient tonight.   Defer to judgement of ED whether patient can be seen as outpatient or inpatient  If pt needs to be seen as inpatient, admit to Hospitalists and obtained BLE ABI in the AM  Will check to see if patient gets admitted tomorrow   Adele Barthel, MD, [facility] [place]: [phone] Pager: [phone]  04/03/2018, 7:21 PM

## 2018-04-03 NOTE — ED Notes (Signed)
Bed: WA08 Expected date:  Expected time:  Means of arrival:  Comments: 77 yo foot injury

## 2018-04-03 NOTE — ED Notes (Signed)
Pt was given a [place] sandwich and ice water.

## 2018-04-03 NOTE — ED Notes (Signed)
ED TO INPATIENT HANDOFF REPORT  Name/Age/Gender Steven Maldonado 77 y.o. male  Code Status    Code Status Orders  (From admission, onward)        Start     Ordered   04/03/18 2118  Full code  Continuous     04/03/18 2119    Code Status History    Date Active Date Inactive Code Status Order ID Comments User Context   12/02/2017 1007 12/05/2017 1454 Full Code [id number]  Crista Luria Inpatient   10/08/2017 1100 10/08/2017 1831 Full Code [id number]  Burnell Blanks, MD Inpatient   06/08/2014 1508 06/09/2014 1442 Full Code [id number]  Wellington Hampshire, MD Inpatient      Home/SNF/Other Home  Chief Complaint foot injury  Level of Care/Admitting Diagnosis ED Disposition    ED Disposition Condition [facility] Area: [place] [100100]  Level of Care: Med-Surg [16]  Diagnosis: Injury of left toe [165537]  Admitting Physician: Norval Morton [[id number]]  Attending Physician: Norval Morton [[id number]]  Estimated length of stay: past midnight tomorrow  Certification:: I certify this patient will need inpatient services for at least 2 midnights  PT Class (Do Not Modify): Inpatient [101]  PT Acc Code (Do Not Modify): Private [1]       Medical History Past Medical History:  Diagnosis Date  . Chronic diastolic CHF (congestive heart failure) ([place])   . CKD (chronic kidney disease), stage III ([place])   . Coronary artery disease    a. Cath February 2012 in [facility], occluded RCA with collaterals  . DM type 2 (diabetes mellitus, type 2) ([place])   . Essential hypertension   . Hyperlipidemia   . Pacemaker    a. symptomatic brady after TAVR s/p MDT PPM by Dr. Curt Bears 12/04/17  . Persistent atrial fibrillation ([place])   . PVD (peripheral vascular disease) ([place])    a. s/p R popliteal artery stenosis tx with drug-coated balloon 05/2014, followed by Dr. Fletcher Anon.  . 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 Severe aortic stenosis    a. 12/02/17: s/p TAVR  . Skin cancer   . Sleep apnea with use of continuous positive airway pressure (CPAP)    04-11-11 AHI was 32.9 and titrated to 15 cm H20, DME is [facility]  . Subclinical hypothyroidism     Allergies No Known Allergies  IV Location/Drains/Wounds Patient Lines/Drains/Airways Status   Active Line/Drains/Airways    Name:   Placement date:   Placement time:   Site:   Days:   Incision (Closed) 12/02/17 Groin Right   12/02/17    0911     122   Incision (Closed) 12/02/17 Groin Left   12/02/17    0911     122   Incision (Closed) 12/04/17 Chest Left   12/04/17    1700     120          Labs/Imaging Results for orders placed or performed during the hospital encounter of 04/03/18 (from the past 48 hour(s))  CBC     Status: Abnormal   Collection Time: 04/03/18  7:45 PM  Result Value Ref Range   WBC 7.9 4.0 - 10.5 K/uL   RBC 3.95 (L) 4.22 - 5.81 MIL/uL   Hemoglobin 12.1 (L) 13.0 - 17.0 g/dL   HCT 36.3 (L) 39.0 - 52.0 %  MCV 91.9 78.0 - 100.0 fL   MCH 30.6 26.0 - 34.0 pg   MCHC 33.3 30.0 - 36.0 g/dL   RDW 13.0 11.5 - 15.5 %   Platelets 185 150 - 400 K/uL    Comment: Performed at [facility], [place] [address], [place], [place] [address]  Comprehensive metabolic panel     Status: Abnormal   Collection Time: 04/03/18  7:45 PM  Result Value Ref Range   Sodium 137 135 - 145 mmol/L   Potassium 4.5 3.5 - 5.1 mmol/L   Chloride 102 101 - 111 mmol/L   CO2 26 22 - 32 mmol/L   Glucose, Bld 253 (H) 65 - 99 mg/dL   BUN 23 (H) 6 - 20 mg/dL   Creatinine, Ser 1.40 (H) 0.61 - 1.24 mg/dL   Calcium 9.2 8.9 - 10.3 mg/dL   Total Protein 7.1 6.5 - 8.1 g/dL   Albumin 3.4 (L) 3.5 - 5.0 g/dL   AST 18 15 - 41 U/L   ALT 19 17 - 63 U/L   Alkaline Phosphatase 47 38 - 126 U/L   Total Bilirubin 0.2 (L) 0.3 - 1.2 mg/dL   GFR calc non Af Amer 48 (L) >60 mL/min    GFR calc Af Amer 56 (L) >60 mL/min    Comment: (NOTE) The eGFR has been calculated using the CKD EPI equation. This calculation has not been validated in all clinical situations. eGFR's persistently <60 mL/min signify possible Chronic Kidney Disease.    Anion gap 9 5 - 15    Comment: Performed at [facility], [place] [address] [place], [place] [address]  Protime-INR     Status: Abnormal   Collection Time: 04/03/18  7:45 PM  Result Value Ref Range   Prothrombin Time 16.0 (H) 11.4 - 15.2 seconds   INR 1.29     Comment: Performed at [facility], [place] [address], [place], [place] [address]  APTT     Status: Abnormal   Collection Time: 04/03/18  7:45 PM  Result Value Ref Range   aPTT 42 (H) 24 - 36 seconds    Comment:        IF BASELINE aPTT IS ELEVATED, SUGGEST PATIENT RISK ASSESSMENT BE USED TO DETERMINE APPROPRIATE ANTICOAGULANT THERAPY. Performed at [facility], [place] [address] [place], [place] [address]   I-Stat Chem 8, ED     Status: Abnormal   Collection Time: 04/03/18  7:57 PM  Result Value Ref Range   Sodium 138 135 - 145 mmol/L   Potassium 4.4 3.5 - 5.1 mmol/L   Chloride 102 101 - 111 mmol/L   BUN 23 (H) 6 - 20 mg/dL   Creatinine, Ser 1.40 (H) 0.61 - 1.24 mg/dL   Glucose, Bld 248 (H) 65 - 99 mg/dL   Calcium, Ion 1.14 (L) 1.15 - 1.40 mmol/L   TCO2 26 22 - 32 mmol/L   Hemoglobin 12.2 (L) 13.0 - 17.0 g/dL   HCT 36.0 (L) 39.0 - 52.0 %   Dg Foot Complete Left  Result Date: 04/03/2018 CLINICAL DATA:  77 y/o M; crush injury 3 weeks ago with pain and swelling. Blackness of the third toe. EXAM: LEFT FOOT - COMPLETE 3+ VIEW COMPARISON:  None. FINDINGS: There is no evidence of fracture or dislocation. There is no evidence of arthropathy or other focal bone abnormality. Vascular calcifications. IMPRESSION: No acute fracture or dislocation identified. Electronically Signed   By: Kristine Garbe M.D.   On:  04/03/2018  18:22    Pending Labs Unresulted Labs (From admission, onward)   Start     Ordered   04/04/18 0500  Hemoglobin A1c  Tomorrow morning,   R     04/03/18 2119   04/04/18 0500  CBC  Tomorrow morning,   R     04/03/18 2119   04/04/18 5102  Basic metabolic panel  Tomorrow morning,   R     04/03/18 2119   04/04/18 0500  HIV antibody  Tomorrow morning,   R     04/03/18 2120   04/04/18 0500  Prealbumin  Tomorrow morning,   R     04/03/18 2120   04/03/18 2121  C-reactive protein  Add-on,   R     04/03/18 2120   04/03/18 2121  Sedimentation rate  Add-on,   R     04/03/18 2120   04/03/18 2103  Blood Cultures x 2 sites  BLOOD CULTURE X 2,   R     04/03/18 2119      Vitals/Pain Today's Vitals   04/03/18 1811 04/03/18 1830 04/03/18 1900 04/03/18 2203  BP:  (!) 194/89 (!) 181/79 (!) 168/86  Pulse:  67 62 77  Resp:    17  Temp:      TempSrc:      SpO2:  98% 98% 97%  Weight:      Height:      PainSc: 0-No pain   0-No pain    Isolation Precautions No active isolations  Medications Medications  fenofibrate tablet 160 mg (has no administration in time range)  amiodarone (PACERONE) tablet 200 mg (has no administration in time range)  docusate sodium (COLACE) capsule 100 mg (has no administration in time range)  gabapentin (NEURONTIN) capsule 300 mg (has no administration in time range)  levothyroxine (SYNTHROID, LEVOTHROID) tablet 150 mcg (has no administration in time range)  insulin glargine (LANTUS) injection 65 Units (has no administration in time range)  atorvastatin (LIPITOR) tablet 80 mg (has no administration in time range)  aspirin EC tablet 81 mg (has no administration in time range)  metoprolol tartrate (LOPRESSOR) tablet 75 mg (75 mg Oral Given 04/03/18 2217)  ondansetron (ZOFRAN) tablet 4 mg (has no administration in time range)    Or  ondansetron (ZOFRAN) injection 4 mg (has no administration in time range)  acetaminophen (TYLENOL) tablet 650 mg (has no administration  in time range)    Or  acetaminophen (TYLENOL) suppository 650 mg (has no administration in time range)  albuterol (PROVENTIL) (2.5 MG/3ML) 0.083% nebulizer solution 2.5 mg (has no administration in time range)  HYDROcodone-acetaminophen (NORCO/VICODIN) 5-325 MG per tablet 1 tablet (has no administration in time range)  cefTRIAXone (ROCEPHIN) 2 g in sodium chloride 0.9 % 100 mL IVPB (0 g Intravenous Stopped 04/03/18 2310)  metroNIDAZOLE (FLAGYL) IVPB 500 mg (has no administration in time range)  vancomycin (VANCOCIN) 2,000 mg in sodium chloride 0.9 % 500 mL IVPB (has no administration in time range)  vancomycin (VANCOCIN) 1,250 mg in sodium chloride 0.9 % 250 mL IVPB (has no administration in time range)  insulin aspart (novoLOG) injection 0-15 Units (has no administration in time range)    Mobility walks

## 2018-04-03 NOTE — ED Provider Notes (Signed)
[place] DEPT Provider Note   CSN: [id number] Arrival date & time: 04/03/18  1728     History   Chief Complaint Chief Complaint  Patient presents with  . Foot Injury    HPI Steven Maldonado is a 77 y.o. male presenting for evaluation of left toe discoloration.  Patient states that 3 weeks ago, he dropped a can on his left third toe.  Initially he had no symptoms.  A week after the injury, patient had some mild darkening of the toe and mild swelling.  Over the past week, toe has become black and white.  He denies pain.  He reports redness of the foot and ankle if he is standing up, but the redness disappears with elevation of the extremity.  He reports history of something similar of the other foot after trauma which improved with antibiotic ointment, thus he has been doing the same on the left toe without improvement.  He denies fevers, chills, chest pain, shortness of breath, nausea, vomiting, abdominal pain.  He denies injury elsewhere.  He has a significant vascular history including diabetes with neuropathy, PAD, CAD, and pr states "stent behind R knee" (not in Epic).  TAVR in January.  He states he is taking his blood thinner as prescribed (pradaxa).   Pt's vascular provider is [name], with [facility].   HPI  Past Medical History:  Diagnosis Date  . Chronic diastolic CHF (congestive heart failure) ([place])   . CKD (chronic kidney disease), stage III ([place])   . Coronary artery disease    a. Cath February 2012 in [facility], occluded RCA with collaterals  . DM type 2 (diabetes mellitus, type 2) ([place])   . Essential hypertension   . Hyperlipidemia   . Pacemaker    a. symptomatic brady after TAVR s/p MDT PPM by Dr. Curt Bears 12/04/17  . Persistent atrial fibrillation ([place])   . PVD (peripheral vascular disease) ([place])    a. s/p R popliteal artery stenosis tx with drug-coated balloon 05/2014, followed by Dr. Fletcher Anon.  . 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 Severe aortic stenosis    a. 12/02/17: s/p TAVR  . Skin cancer   . Sleep apnea with use of continuous positive airway pressure (CPAP)    04-11-11 AHI was 32.9 and titrated to 15 cm H20, DME is [facility]  . Subclinical hypothyroidism     Patient Active Problem List   Diagnosis Date Noted  . PAF (paroxysmal atrial fibrillation) ([place]) 12/05/2017  . Pacemaker   . Symptomatic bradycardia   . Asystole ([place])   . 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 Severe aortic stenosis   . Essential hypertension 11/14/2015  . PAD (peripheral artery disease) ([place]) 05/24/2014  . Diabetes ([place]) 10/06/2013  . Chronic kidney disease 08/30/2013  . Hypothyroidism 08/30/2013  . Obesity (BMI 30-39.9) 07/15/2013  . OSA on CPAP 07/15/2013  . Diabetes mellitus type 2, uncomplicated ([place])   . Hyperlipidemia 01/10/2011  . CAD (coronary artery disease) 01/10/2011    Past Surgical History:  Procedure Laterality Date  . ABDOMINAL ANGIOGRAM N/A 06/08/2014   Procedure: ABDOMINAL ANGIOGRAM;  Surgeon: Wellington Hampshire, MD;  Location: [facility] CATH LAB;  Service: Cardiovascular;  Laterality: N/A;  . APPENDECTOMY  1965  . CARDIAC CATHETERIZATION  12/2010  . CARDIOVERSION  07/2011  . CARDIOVERSION N/A 04/18/2014  Procedure: CARDIOVERSION;  Surgeon: Dorothy Spark, MD;  Location: [place];  Service: Cardiovascular;  Laterality: N/A;  . CARDIOVERSION N/A 11/03/2015   Procedure: CARDIOVERSION;  Surgeon: Lelon Perla, MD;  Location: [facility] ENDOSCOPY;  Service: Cardiovascular;  Laterality: N/A;  . CARDIOVERSION N/A 05/08/2017   Procedure: CARDIOVERSION;  Surgeon: Dorothy Spark, MD;  Location: [facility] ENDOSCOPY;  Service: Cardiovascular;  Laterality: N/A;  . CARDIOVERSION N/A 07/28/2017   Procedure: CARDIOVERSION;  Surgeon: Dorothy Spark, MD;   Location: [place];  Service: Cardiovascular;  Laterality: N/A;  . LOWER EXTREMITY ANGIOGRAM N/A 06/08/2014   Procedure: LOWER EXTREMITY ANGIOGRAM;  Surgeon: Wellington Hampshire, MD;  Location: [facility] CATH LAB;  Service: Cardiovascular;  Laterality: N/A;  . PACEMAKER IMPLANT N/A 12/04/2017   Procedure: PACEMAKER IMPLANT;  Surgeon: Constance Haw, MD;  Location: [place] CV LAB;  Service: Cardiovascular;  Laterality: N/A;  . POPLITEAL ARTERY ANGIOPLASTY Right 06/08/2014   Archie Endo 06/08/2014  . RIGHT/LEFT HEART CATH AND CORONARY ANGIOGRAPHY N/A 10/08/2017   Procedure: RIGHT/LEFT HEART CATH AND CORONARY ANGIOGRAPHY;  Surgeon: Burnell Blanks, MD;  Location: [place] CV LAB;  Service: Cardiovascular;  Laterality: N/A;  . SKIN CANCER EXCISION Bilateral    "have had them cut off back of neck X 2; off left upper arm; right wrist, near right shoulder blade" (06/08/2014)  . TEE WITHOUT CARDIOVERSION N/A 12/02/2017   Procedure: TRANSESOPHAGEAL ECHOCARDIOGRAM (TEE);  Surgeon: Burnell Blanks, MD;  Location: [place];  Service: Open Heart Surgery;  Laterality: N/A;  . TEMPORARY PACEMAKER N/A 12/04/2017   Procedure: TEMPORARY PACEMAKER;  Surgeon: Leonie Man, MD;  Location: [place] CV LAB;  Service: Cardiovascular;  Laterality: N/A;  . TRANSCATHETER AORTIC VALVE REPLACEMENT, TRANSFEMORAL N/A 12/02/2017   Procedure: TRANSCATHETER AORTIC VALVE REPLACEMENT, TRANSFEMORAL;  Surgeon: Burnell Blanks, MD;  Location: [place];  Service: Open Heart Surgery;  Laterality: N/A;  using Edwards Sapien 3 Transcatheter Heart Valve size 9mm        Home Medications    Prior to Admission medications   Medication Sig Start Date End Date Taking? Authorizing Provider  amiodarone (PACERONE) 200 MG tablet Take 1 tablet (200 mg total) by mouth daily. 11/06/17   Burnell Blanks, MD  aspirin EC 81 MG tablet Take 81 mg by mouth daily.    [provider]  atorvastatin (LIPITOR) 80 MG  tablet Take 80 mg by mouth at bedtime.    [provider]  Blood Glucose Monitoring Suppl (FREESTYLE LITE) DEVI 1 each by Does not apply route 2 (two) times daily. 10/20/13   [name], [name]  Choline Fenofibrate 135 MG capsule Take 135 mg by mouth daily.    [provider]  CINNAMON PO Take 1 tablet by mouth 2 (two) times daily.    [provider]  dabigatran (PRADAXA) 150 MG CAPS capsule Take 150 mg by mouth every 12 (twelve) hours.    [provider]  diphenhydramine-acetaminophen (TYLENOL PM) 25-500 MG TABS tablet Take 1 tablet by mouth at bedtime as needed (sleep).    [provider]  docusate sodium (COLACE) 100 MG capsule Take 100 mg daily by mouth.  05/06/13   [name], [name]  doxycycline (VIBRA-TABS) 100 MG tablet Take 1 tablet (100 mg total) by mouth 2 (two) times daily. 03/27/18   Delsa Grana, [name]  fesoterodine (TOVIAZ) 4 MG TB24 tablet Take 4 mg by mouth daily.    [provider]  furosemide (LASIX) 20 MG  tablet Take 1 tablet (20 mg total) by mouth daily. 09/02/17   Dena Billet B, [name]  gabapentin (NEURONTIN) 300 MG capsule Take 1 capsule (300 mg total) by mouth 3 (three) times daily. 12/25/17   Dena Billet B, [name]  glipiZIDE (GLUCOTROL XL) 10 MG 24 hr tablet TAKE 1 TABLET (10 MG TOTAL) BY MOUTH DAILY WITH BREAKFAST. 02/02/18   Dena Billet B, [name]  glucose blood (FREESTYLE LITE) test strip 1 each by Other route 4 (four) times daily -  before meals and at bedtime. 06/27/16   [name], [name]  HUMALOG KWIKPEN 100 UNIT/ML KiwkPen INJECT 5 UNITS             SUBCUTANEOUSLY 3 TIMES A   DAY 02/11/18   Dena Billet B, [name]  Insulin Glargine (BASAGLAR KWIKPEN) 100 UNIT/ML SOPN Inject 0.65 mLs (65 Units total) into the skin at bedtime. 05/06/17   Dena Billet B, [name]  insulin lispro (HUMALOG) 100 UNIT/ML KiwkPen Inject 0.05 mLs (5 Units total) into the skin 3 (three) times daily. 06/18/17   [name], [name]  Insulin Pen Needle (BD PEN NEEDLE  NANO U/F) 32G X 4 MM MISC Use as directed four (4) times daily.    [provider]  levothyroxine (SYNTHROID, LEVOTHROID) 150 MCG tablet TAKE 1 TABLET (150 MCG TOTAL) BY MOUTH DAILY. TAKE FOR 3 WEEKS THEN FOLLOW UP WITH PROVIDER 03/23/18   Dena Billet B, [name]  magnesium oxide (MAG-OX) 400 MG tablet Take 400 mg by mouth daily.    [provider]  metoprolol tartrate (LOPRESSOR) 50 MG tablet Take 75 mg by mouth 2 (two) times daily.    [provider]  mirabegron ER (MYRBETRIQ) 25 MG TB24 tablet Take 25 mg by mouth daily.    [provider]    Family History Family History  Problem Relation Age of Onset  . Diabetes Mother   . Heart attack Mother   . Hypertension Mother   . Heart attack Father   . Heart failure Father   . Hypertension Father   . Diabetes Father   . Diabetes Sister   . Diabetes Brother   . Diabetes Other   . Diabetes Daughter        TYPE ll  . Heart Problems Daughter   . Hypertension Sister   . Hypertension Brother   . Stroke Brother     Social History Social History   Tobacco Use  . Smoking status: Former Smoker    Packs/day: 2.00    Years: 14.00    Pack years: 28.00    Types: Cigarettes    Last attempt to quit: 11/11/1972    Years since quitting: 45.4  . Smokeless tobacco: Never Used  Substance Use Topics  . Alcohol use: No    Comment: quit in 1984  . Drug use: No     Allergies   Patient has no known allergies.   Review of Systems Review of Systems  Cardiovascular: Positive for leg swelling.  Skin: Positive for color change.  Hematological: Bruises/bleeds easily.  All other systems reviewed and are negative.    Physical Exam Updated Vital Signs BP (!) 170/72   Pulse (!) 58   Temp 98.7 F (37.1 C) (Oral)   Resp 16   Ht 5\' 11"  (1.803 m)   Wt 113.9 kg (251 lb)   SpO2 99%   BMI 35.01 kg/m   Physical Exam  Constitutional: He is oriented to person, place, and time. He appears well-developed  and  well-nourished. No distress.  Sitting comfortably in bed in NAD  HENT:  Head: Normocephalic and atraumatic.  Eyes: Pupils are equal, round, and reactive to light. Conjunctivae and EOM are normal.  Neck: Normal range of motion. Neck supple.  Cardiovascular: Normal rate, regular rhythm and intact distal pulses.  Pulmonary/Chest: Effort normal and breath sounds normal. No respiratory distress. He has no wheezes.  Abdominal: Soft. He exhibits no distension and no mass. There is no tenderness. There is no guarding.  Musculoskeletal: Normal range of motion. He exhibits edema.  Mild left leg edema, worse in the foot.  Pedal pulses intact bilaterally.  Left toes cooler than right.  Neurological: He is alert and oriented to person, place, and time.  Skin:  Left middle toe black and white.  This does not extend past the distal toe.  Surrounding distal foot is erythematous.  All toes are cooler than on the right side. See pictures below  Psychiatric: He has a normal mood and affect.  Nursing note and vitals reviewed.        ED Treatments / Results  Labs (all labs ordered are listed, but only abnormal results are displayed) Labs Reviewed  CBC  COMPREHENSIVE METABOLIC PANEL  PROTIME-INR  APTT  I-STAT CHEM 8, ED    EKG None  Radiology Dg Foot Complete Left  Result Date: 04/03/2018 CLINICAL DATA:  77 y/o M; crush injury 3 weeks ago with pain and swelling. Blackness of the third toe. EXAM: LEFT FOOT - COMPLETE 3+ VIEW COMPARISON:  None. FINDINGS: There is no evidence of fracture or dislocation. There is no evidence of arthropathy or other focal bone abnormality. Vascular calcifications. IMPRESSION: No acute fracture or dislocation identified. Electronically Signed   By: Kristine Garbe M.D.   On: 04/03/2018 18:22    Procedures Procedures (including critical care time)  Medications Ordered in ED Medications - No data to display   Initial Impression / Assessment and Plan / ED  Course  I have reviewed the triage vital signs and the nursing notes.  Pertinent labs & imaging results that were available during my care of the patient were reviewed by me and considered in my medical decision making (see chart for details).     Patient presenting for evaluation of color change of his third left toe.  Physical exam shows cool, pale and black third left toe.  Pedal pulses found bilaterally with Doppler.  Distal left foot cooler than right.  Mild surrounding erythema, which improves with positioning.  Concern for vascular occlusion causing patient's symptoms.  No chest pain or shortness of breath.  Doubt PE or CAD.  No fever or warmth of the foot, doubt cellulitis.  Will obtain labs and consult with vascular.  Case discussed with attending, Dr. Sherry Ruffing evaluated patient. Xray without osteo.   Labs without leukocytosis.  Otherwise reassuring.  Discussed with vascular, who recommends admission to hospitalist service, transfer to [facility], and evaluation by vascular in the morning.  Does not recommend any further imaging at this time.  Discussed with Dr. Tamala Julian from [facility] hospitalist service, who will admit the patient.   Final Clinical Impressions(s) / ED Diagnoses   Final diagnoses:  Ischemia    ED Discharge Orders    None       Franchot Heidelberg, [name] 04/04/18 0038    Tegeler, Gwenyth Allegra, MD 04/04/18 1027

## 2018-04-03 NOTE — ED Triage Notes (Signed)
Per EMS: Pt reports that 3 weeks ago he dropped a can on his left foot causing swelling and pain. Pt had been putting OTC antibiotic ointment on the foot with no relief.  Pt reports his 3rd toe of the left foot is now black and has swelling and redness up to ankle.

## 2018-04-03 NOTE — H&P (Signed)
History and Physical    Steven Maldonado HYI:[id number] DOB: 1940-12-09 DOA: 04/03/2018  Referring MD/NP/PA: Franchot Heidelberg, [name] PCP: [name], [name]  Patient from: home  Chief Complaint: Left foot pain  I have personally briefly reviewed patient's old medical records in [place]   HPI: Steven Maldonado is a 77 y.o. male with medical history significant of HTN, HLD, chronic diastolic CHF,  A. Fib, aortic stenosis s/p TAVR in 11/2017 with subsequent symptomatic bradycardia requiring PM placed, PVD s/p right popliteal stenting, DM type II, CAD CKD stage III, and OSA; who present with left third toe pain.  He accidentally dropped a can onto his left foot while getting something out of the pantry approximately 3 weeks ago.  Initially, patient he had some mild dark discoloration and swelling of the third toe.  He states utilizing Neosporin ointment on the affected area with some improvement of symptoms.  However, over the last week patient reports noticing redness in the left forefoot and worsening discoloration of the third toe.  Upon review of records he was seen by his PCP on 5/17, but appears was evaluated for redness on the second toe of his right foot after a blister from shoes rubbing formed.  He was given a prescription for doxycycline which improved symptoms symptoms of the redness on the right second digit.  However, patient continues to have redness of the left foot and he noted at the third toe was starting to turn black appearance.  Patient denies of any significant pain in the affected toe.  He intermittently has sharp shooting pains that are fleeting.  Denies having any fever, chills, nausea, vomiting, shortness of breath, change in weight, diarrhea, or dysuria symptoms.  Patient and his wife have been on vacation he reports that his blood sugars have been in the 300s because he is not been as strict about his food selection.   ED Course: Upon admission to the emergency  department patient was seen to be afebrile, pulse 58, blood pressure 170/72, and all other vitals relatively within normal limits.  Labs revealed WBC 7.9, hemoglobin 12.2, BUN 23, creatinine 1.4, glucose 248.  Ice was applied to the area. Vascular surgery was consulted and recommended transfer to [facility].  Review of Systems  Constitutional: Negative for chills, fever, malaise/fatigue and weight loss.  HENT: Negative for ear discharge and ear pain.   Eyes: Negative for photophobia and discharge.  Respiratory: Negative for cough and shortness of breath.   Cardiovascular: Positive for leg swelling. Negative for chest pain.  Gastrointestinal: Negative for abdominal pain, nausea and vomiting.  Genitourinary: Positive for frequency. Negative for dysuria.  Musculoskeletal: Positive for joint pain and myalgias.  Skin:       Positive for skin color change  Neurological: Positive for sensory change. Negative for focal weakness and weakness.  Endo/Heme/Allergies: Negative for polydipsia. Bruises/bleeds easily.  Psychiatric/Behavioral: Negative for hallucinations. The patient is not nervous/anxious.     Past Medical History:  Diagnosis Date  . Chronic diastolic CHF (congestive heart failure) ([place])   . CKD (chronic kidney disease), stage III ([place])   . Coronary artery disease    a. Cath February 2012 in [facility], occluded RCA with collaterals  . DM type 2 (diabetes mellitus, type 2) ([place])   . Essential hypertension   . Hyperlipidemia   . Pacemaker    a. symptomatic brady after TAVR s/p MDT PPM by Dr. Curt Bears 12/04/17  . Persistent atrial fibrillation ([place])   .  PVD (peripheral vascular disease) ([place])    a. s/p R popliteal artery stenosis tx with drug-coated balloon 05/2014, followed by Dr. Fletcher Anon.  . 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 Severe aortic stenosis    a.  12/02/17: s/p TAVR  . Skin cancer   . Sleep apnea with use of continuous positive airway pressure (CPAP)    04-11-11 AHI was 32.9 and titrated to 15 cm H20, DME is [facility]  . Subclinical hypothyroidism     Past Surgical History:  Procedure Laterality Date  . ABDOMINAL ANGIOGRAM N/A 06/08/2014   Procedure: ABDOMINAL ANGIOGRAM;  Surgeon: Wellington Hampshire, MD;  Location: [facility] CATH LAB;  Service: Cardiovascular;  Laterality: N/A;  . APPENDECTOMY  1965  . CARDIAC CATHETERIZATION  12/2010  . CARDIOVERSION  07/2011  . CARDIOVERSION N/A 04/18/2014   Procedure: CARDIOVERSION;  Surgeon: Dorothy Spark, MD;  Location: [place];  Service: Cardiovascular;  Laterality: N/A;  . CARDIOVERSION N/A 11/03/2015   Procedure: CARDIOVERSION;  Surgeon: Lelon Perla, MD;  Location: [facility] ENDOSCOPY;  Service: Cardiovascular;  Laterality: N/A;  . CARDIOVERSION N/A 05/08/2017   Procedure: CARDIOVERSION;  Surgeon: Dorothy Spark, MD;  Location: [facility] ENDOSCOPY;  Service: Cardiovascular;  Laterality: N/A;  . CARDIOVERSION N/A 07/28/2017   Procedure: CARDIOVERSION;  Surgeon: Dorothy Spark, MD;  Location: [place];  Service: Cardiovascular;  Laterality: N/A;  . LOWER EXTREMITY ANGIOGRAM N/A 06/08/2014   Procedure: LOWER EXTREMITY ANGIOGRAM;  Surgeon: Wellington Hampshire, MD;  Location: [facility] CATH LAB;  Service: Cardiovascular;  Laterality: N/A;  . PACEMAKER IMPLANT N/A 12/04/2017   Procedure: PACEMAKER IMPLANT;  Surgeon: Constance Haw, MD;  Location: [place] CV LAB;  Service: Cardiovascular;  Laterality: N/A;  . POPLITEAL ARTERY ANGIOPLASTY Right 06/08/2014   Archie Endo 06/08/2014  . RIGHT/LEFT HEART CATH AND CORONARY ANGIOGRAPHY N/A 10/08/2017   Procedure: RIGHT/LEFT HEART CATH AND CORONARY ANGIOGRAPHY;  Surgeon: Burnell Blanks, MD;  Location: [place] CV LAB;  Service: Cardiovascular;  Laterality: N/A;  . SKIN CANCER EXCISION Bilateral    "have had them cut off back of neck X 2; off left upper arm;  right wrist, near right shoulder blade" (06/08/2014)  . TEE WITHOUT CARDIOVERSION N/A 12/02/2017   Procedure: TRANSESOPHAGEAL ECHOCARDIOGRAM (TEE);  Surgeon: Burnell Blanks, MD;  Location: [place];  Service: Open Heart Surgery;  Laterality: N/A;  . TEMPORARY PACEMAKER N/A 12/04/2017   Procedure: TEMPORARY PACEMAKER;  Surgeon: Leonie Man, MD;  Location: [place] CV LAB;  Service: Cardiovascular;  Laterality: N/A;  . TRANSCATHETER AORTIC VALVE REPLACEMENT, TRANSFEMORAL N/A 12/02/2017   Procedure: TRANSCATHETER AORTIC VALVE REPLACEMENT, TRANSFEMORAL;  Surgeon: Burnell Blanks, MD;  Location: [place];  Service: Open Heart Surgery;  Laterality: N/A;  using Edwards Sapien 3 Transcatheter Heart Valve size 75m     reports that he quit smoking about 45 years ago. His smoking use included cigarettes. He has a 28.00 pack-year smoking history. He has never used smokeless tobacco. He reports that he does not drink alcohol or use drugs.  No Known Allergies  Family History  Problem Relation Age of Onset  . Diabetes Mother   . Heart attack Mother   . Hypertension Mother   . Heart attack Father   . Heart failure Father   . Hypertension Father   . Diabetes Father   . Diabetes Sister   . Diabetes Brother   .  Diabetes Other   . Diabetes Daughter        TYPE ll  . Heart Problems Daughter   . Hypertension Sister   . Hypertension Brother   . Stroke Brother     Prior to Admission medications   Medication Sig Start Date End Date Taking? Authorizing Provider  amiodarone (PACERONE) 200 MG tablet Take 1 tablet (200 mg total) by mouth daily. 11/06/17   Burnell Blanks, MD  aspirin EC 81 MG tablet Take 81 mg by mouth daily.    [provider]  atorvastatin (LIPITOR) 80 MG tablet Take 80 mg by mouth at bedtime.    [provider]  Blood Glucose Monitoring Suppl (FREESTYLE LITE) DEVI 1 each by Does not apply route 2 (two) times daily. 10/20/13   [name], [name]    Choline Fenofibrate 135 MG capsule Take 135 mg by mouth daily.    [provider]  CINNAMON PO Take 1 tablet by mouth 2 (two) times daily.    [provider]  dabigatran (PRADAXA) 150 MG CAPS capsule Take 150 mg by mouth every 12 (twelve) hours.    [provider]  diphenhydramine-acetaminophen (TYLENOL PM) 25-500 MG TABS tablet Take 1 tablet by mouth at bedtime as needed (sleep).    [provider]  docusate sodium (COLACE) 100 MG capsule Take 100 mg daily by mouth.  05/06/13   [name], [name]  doxycycline (VIBRA-TABS) 100 MG tablet Take 1 tablet (100 mg total) by mouth 2 (two) times daily. 03/27/18   Delsa Grana, [name]  fesoterodine (TOVIAZ) 4 MG TB24 tablet Take 4 mg by mouth daily.    [provider]  furosemide (LASIX) 20 MG tablet Take 1 tablet (20 mg total) by mouth daily. 09/02/17   Dena Billet B, [name]  gabapentin (NEURONTIN) 300 MG capsule Take 1 capsule (300 mg total) by mouth 3 (three) times daily. 12/25/17   Dena Billet B, [name]  glipiZIDE (GLUCOTROL XL) 10 MG 24 hr tablet TAKE 1 TABLET (10 MG TOTAL) BY MOUTH DAILY WITH BREAKFAST. 02/02/18   Dena Billet B, [name]  glucose blood (FREESTYLE LITE) test strip 1 each by Other route 4 (four) times daily -  before meals and at bedtime. 06/27/16   [name], [name]  HUMALOG KWIKPEN 100 UNIT/ML KiwkPen INJECT 5 UNITS             SUBCUTANEOUSLY 3 TIMES A   DAY 02/11/18   Dena Billet B, [name]  Insulin Glargine (BASAGLAR KWIKPEN) 100 UNIT/ML SOPN Inject 0.65 mLs (65 Units total) into the skin at bedtime. 05/06/17   Dena Billet B, [name]  insulin lispro (HUMALOG) 100 UNIT/ML KiwkPen Inject 0.05 mLs (5 Units total) into the skin 3 (three) times daily. 06/18/17   [name], [name]  Insulin Pen Needle (BD PEN NEEDLE NANO U/F) 32G X 4 MM MISC Use as directed four (4) times daily.    [provider]  levothyroxine (SYNTHROID, LEVOTHROID) 150 MCG tablet TAKE 1 TABLET (150 MCG TOTAL) BY MOUTH DAILY. TAKE FOR 3  WEEKS THEN FOLLOW UP WITH PROVIDER 03/23/18   Dena Billet B, [name]  magnesium oxide (MAG-OX) 400 MG tablet Take 400 mg by mouth daily.    [provider]  metoprolol tartrate (LOPRESSOR) 50 MG tablet Take 75 mg by mouth 2 (two) times daily.    [provider]  mirabegron ER (MYRBETRIQ) 25 MG TB24 tablet Take 25 mg by mouth daily.    [provider]    Physical Exam:  Constitutional: Obese male in NAD, calm, comfortable Vitals:   04/03/18 1744 04/03/18 1802  BP: (!) 170/72   Pulse: (!) 58   Resp: 16   Temp: 98.7 F (37.1 C)   TempSrc: Oral   SpO2: 99%   Weight:  113.9 kg (251 lb)  Height:  '5\' 11"'  (1.803 m)   Eyes: PERRL, lids and conjunctivae normal ENMT: Mucous membranes are moist. Posterior pharynx clear of any exudate or lesions.  Neck: normal, supple, no masses, no thyromegaly Respiratory: clear to auscultation bilaterally, no wheezing, no crackles. Normal respiratory effort. No accessory muscle use.  Cardiovascular: Regular rate and rhythm, no murmurs / rubs / gallops.  . 2+ pedal pulses. No carotid bruits.  Abdomen: no tenderness, no masses palpated. No hepatosplenomegaly. Bowel sounds positive.  Musculoskeletal: no clubbing / cyanosis. No joint deformity upper and lower extremities. Good ROM, no contractures. Normal muscle tone. Mild swelling of the left forefoot Skin: Erythema noted of the left forefoot with with desquamation of the third toe with dark black appearance as seen below.      Neurologic: CN 2-12 grossly intact. Sensation abnormal. DTR normal. Strength 5/5 in all 4.  Psychiatric: Poor judgment and insight. Alert and oriented x 3. Normal mood.       Labs on Admission: I have personally reviewed following labs and imaging studies  CBC: Recent Labs  Lab 04/03/18 1945 04/03/18 1957  WBC 7.9  --   HGB 12.1* 12.2*  HCT 36.3* 36.0*  MCV 91.9  --   PLT 185  --    Basic Metabolic Panel: Recent Labs  Lab 04/03/18 1957  NA 138    K 4.4  CL 102  GLUCOSE 248*  BUN 23*  CREATININE 1.40*   GFR: Estimated Creatinine Clearance: 59.4 mL/min (A) (by C-G formula based on SCr of 1.4 mg/dL (H)). Liver Function Tests: No results for input(s): AST, ALT, ALKPHOS, BILITOT, PROT, ALBUMIN in the last 168 hours. No results for input(s): LIPASE, AMYLASE in the last 168 hours. No results for input(s): AMMONIA in the last 168 hours. Coagulation Profile: Recent Labs  Lab 04/03/18 1945  INR 1.29   Cardiac Enzymes: No results for input(s): CKTOTAL, CKMB, CKMBINDEX, TROPONINI in the last 168 hours. BNP (last 3 results) Recent Labs    12/11/17 1522  PROBNP 271   HbA1C: No results for input(s): HGBA1C in the last 72 hours. CBG: No results for input(s): GLUCAP in the last 168 hours. Lipid Profile: No results for input(s): CHOL, HDL, LDLCALC, TRIG, CHOLHDL, LDLDIRECT in the last 72 hours. Thyroid Function Tests: No results for input(s): TSH, T4TOTAL, FREET4, T3FREE, THYROIDAB in the last 72 hours. Anemia Panel: No results for input(s): VITAMINB12, FOLATE, FERRITIN, TIBC, IRON, RETICCTPCT in the last 72 hours. Urine analysis:    Component Value Date/Time   COLORURINE YELLOW 11/28/2017 1128   APPEARANCEUR HAZY (A) 11/28/2017 1128   LABSPEC 1.022 11/28/2017 1128   PHURINE 5.0 11/28/2017 1128   GLUCOSEU >=500 (A) 11/28/2017 1128   HGBUR NEGATIVE 11/28/2017 1128   BILIRUBINUR NEGATIVE 11/28/2017 1128   KETONESUR NEGATIVE 11/28/2017 1128   PROTEINUR NEGATIVE 11/28/2017 1128   UROBILINOGEN 0.2 01/02/2015 1559   NITRITE NEGATIVE 11/28/2017 1128   LEUKOCYTESUR LARGE (A) 11/28/2017 1128   Sepsis Labs: No results found for this or any previous visit (from the past 240 hour(s)).   Radiological Exams on Admission: Dg Foot Complete Left  Result Date: 04/03/2018 CLINICAL DATA:  77 y/o  M; crush injury 3 weeks ago with pain and swelling. Blackness of the third toe. EXAM: LEFT FOOT - COMPLETE 3+ VIEW COMPARISON:  None. FINDINGS:  There is no evidence of fracture or dislocation. There is no evidence of arthropathy or other focal bone abnormality. Vascular calcifications. IMPRESSION: No acute fracture or dislocation identified. Electronically Signed   By: Kristine Garbe M.D.   On: 04/03/2018 18:22    X-rays of left foot: Independently reviewed.  No acute abnormality noted.  Assessment/Plan Cellulitis in a diabetic foot infection: acute.  Patient presents with worsening discoloration of the third digit of the left foot with known history of peripheral vascular disease requiring previous stent to the right lower extremity Doppler pulses are present.  It appears that despite being on doxycycline since the 17th patient has had continued erythema of the left forefoot.  Suspect diabetic foot infection. 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  Initial blood glucose elevated at 248. - Hypoglycemic pro diabetic tocol - Check hemoglobin A1c in a.m. - Hold glipizide - Continue glargine 65 units nightly - CBGs q. AC with moderate SSI - Continue gabapentin  Peripheral vascular disease: Patient with previous history of stent placement of the right lower extremity.  Vascular surgery consulted due to question of possible ischemia following trauma to the third toe. - Follow-up with vascular surgery  Proximal is atrial fibrillation, on chronic anticoagulant - Continue amiodarone  - Held Pradaxa for possible need of surgical intervention   Chronic kidney disease stage III: Patient presents with a creatinine of 1.4  with BUN 23; which appears to be near patient's baseline. - Continue to monitor  CAD - Continue aspirin  Diastolic congestive heart failure: Patient appears to be euvolemic at this time.  Last EF noted to be 55 to 60% by echocardiogram  on 12/24/2017. - Strict I&O's and daily weights  Aortic stenosis s/p TAVR, symptomatic bradycardia requiring pacemaker placement: Patient was admitted to the hospital for the surgical procedure in 11/2017.  Essential hypertension - Initially held furosemide, restart when medically appropriate - Continue metoprolol   Hypothyroidism: Last TSH noted to be 20.91 on 01/12/2018 - recheck TSH - Continue levothyroxine   Dyslipidemia - Continue atorvastatin, fenofibrate  OSA on CPAP - Patient's wife to bring his home CPAP machine in a.m.   DVT prophylaxis: Pradaxa on hold Code Status: full Family Communication: Discussed plan of care with the patient family present at bedside Disposition Plan: Likely discharge home once medically stable in 2 to 3 days Consults called: Vascular surgery Admission status: Inpatient  Norval Morton MD [facility] Pager [phone]   If 7PM-7AM, please contact night-coverage [email or web address] Password [facility]  04/03/2018, 8:31 PM

## 2018-04-03 NOTE — Progress Notes (Signed)
Pharmacy Antibiotic Note  Steven Maldonado is a 77 y.o. male admitted on 04/03/2018 with suspected DM foot infection'.  Pharmacy has been consulted for vancomycin dosing.  Plan:  Vancomycin 2g IV x 1 then 1250mg  IV q24 - goal AUC 400-500  Will check vanc pk/tr as appropriate  Height: 5\' 11"  (180.3 cm) Weight: 251 lb (113.9 kg) IBW/kg (Calculated) : 75.3  Temp (24hrs), Avg:98.7 F (37.1 C), Min:98.7 F (37.1 C), Max:98.7 F (37.1 C)  Recent Labs  Lab 04/03/18 1945 04/03/18 1957  WBC 7.9  --   CREATININE 1.40* 1.40*    Estimated Creatinine Clearance: 59.4 mL/min (A) (by C-G formula based on SCr of 1.4 mg/dL (H)).    No Known Allergies   Thank you for allowing pharmacy to be a part of this patient's care.   Adrian Saran, PharmD, BCPS Pager [phone] 04/03/2018 9:56 PM

## 2018-04-03 NOTE — ED Notes (Signed)
RN stuck pt x2 without success. Pt advised we are going to have to have someone do an ultrasound guided IV. Pt agreeable with this plan.

## 2018-04-03 NOTE — ED Notes (Addendum)
First set of cultures obtained before antibiotics were started. RN unable to obtain second set of cultures before antibiotics were started due to being a difficult stick.

## 2018-04-04 ENCOUNTER — Encounter (HOSPITAL_COMMUNITY): Payer: Self-pay

## 2018-04-04 ENCOUNTER — Inpatient Hospital Stay (HOSPITAL_COMMUNITY): Payer: Medicare Other

## 2018-04-04 DIAGNOSIS — E11628 Type 2 diabetes mellitus with other skin complications: Secondary | ICD-10-CM

## 2018-04-04 DIAGNOSIS — L03119 Cellulitis of unspecified part of limb: Secondary | ICD-10-CM

## 2018-04-04 DIAGNOSIS — L03031 Cellulitis of right toe: Secondary | ICD-10-CM | POA: Diagnosis present

## 2018-04-04 DIAGNOSIS — I96 Gangrene, not elsewhere classified: Secondary | ICD-10-CM

## 2018-04-04 LAB — GLUCOSE, CAPILLARY
Glucose-Capillary: 107 mg/dL — ABNORMAL HIGH (ref 65–99)
Glucose-Capillary: 135 mg/dL — ABNORMAL HIGH (ref 65–99)
Glucose-Capillary: 221 mg/dL — ABNORMAL HIGH (ref 65–99)
Glucose-Capillary: 156 mg/dL — ABNORMAL HIGH (ref 65–99)

## 2018-04-04 LAB — C-REACTIVE PROTEIN: CRP: 1.3 mg/dL — ABNORMAL HIGH (ref <1.0)

## 2018-04-04 LAB — HEPARIN LEVEL (UNFRACTIONATED): Heparin Unfractionated: 0.43 IU/mL (ref 0.30–0.70)

## 2018-04-04 MED ORDER — HYDRALAZINE HCL 20 MG/ML IJ SOLN: 10.0000 mg | INTRAMUSCULAR | Status: DC | PRN

## 2018-04-04 MED ORDER — HEPARIN BOLUS VIA INFUSION
3000.0000 [IU] | Freq: Once | INTRAVENOUS | Status: AC
  Administered 2018-04-04: 3000 [IU] via INTRAVENOUS
  Filled 2018-04-04: qty 3000

## 2018-04-04 MED ORDER — METRONIDAZOLE IN NACL 5-0.79 MG/ML-% IV SOLN
500.0000 mg | Freq: Three times a day (TID) | INTRAVENOUS | Status: DC
  Administered 2018-04-04 – 2018-04-06 (×7): 500 mg via INTRAVENOUS
  Filled 2018-04-04 (×9): qty 100

## 2018-04-04 MED ORDER — HEPARIN (PORCINE) IN NACL 100-0.45 UNIT/ML-% IJ SOLN
1700.0000 [IU]/h | INTRAMUSCULAR | Status: DC
  Administered 2018-04-04 – 2018-04-05 (×2): 1600 [IU]/h via INTRAVENOUS
  Filled 2018-04-04 (×4): qty 250

## 2018-04-04 NOTE — ED Notes (Signed)
Patient took all belonging with him. Patient taken to [facility] by [facility].

## 2018-04-04 NOTE — Consult Note (Signed)
Requested by:  Dr. Charlean Merl  Reason for consultation: left 3rd toe ischemia   History of Present Illness   Steven Maldonado is a 77 y.o. (June 05, 1941) male who presents with chief complaint: black toe L foot.  Onset of symptoms occurred 3 weeks ago when patient dropped a can on his L foot.  Initially, the L 3rd toe was only purple but the toe never improved in appearance, becoming black.  Pt reported baseline has bilateral neuropathic pain > intermittent claudication.  He didn't have significant pain in the L 3rd toe until he began developing some redness around the toe.  Pain is described as sharp, severity 3-6/10, and associated with manipulating toe.  Patient has attempted to treat this pain with rest.  The patient has no rest pain symptoms.  Patient denies any fever or chills.  He denies any drainage form the L 3rd toe.  Dr. Fletcher Anon has completed a Pop artery intervention previously.  Atherosclerotic risk factors include: HLD, HTN, distant former smoker.  Past Medical History:  Diagnosis Date  . Chronic diastolic CHF (congestive heart failure) ([place])   . CKD (chronic kidney disease), stage III ([place])   . Coronary artery disease    a. Cath February 2012 in [facility], occluded RCA with collaterals  . DM type 2 (diabetes mellitus, type 2) ([place])   . Essential hypertension   . Hyperlipidemia   . Pacemaker    a. symptomatic brady after TAVR s/p MDT PPM by Dr. Curt Bears 12/04/17  . Persistent atrial fibrillation ([place])   . PVD (peripheral vascular disease) ([place])    a. s/p R popliteal artery stenosis tx with drug-coated balloon 05/2014, followed by Dr. Fletcher Anon.  . 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 Severe aortic stenosis    a. 12/02/17: s/p TAVR  . Skin cancer   . Sleep apnea with use of continuous positive airway pressure (CPAP)    04-11-11 AHI was 32.9 and  titrated to 15 cm H20, DME is [facility]  . Subclinical hypothyroidism     Past Surgical History:  Procedure Laterality Date  . ABDOMINAL ANGIOGRAM N/A 06/08/2014   Procedure: ABDOMINAL ANGIOGRAM;  Surgeon: Wellington Hampshire, MD;  Location: [facility] CATH LAB;  Service: Cardiovascular;  Laterality: N/A;  . APPENDECTOMY  1965  . CARDIAC CATHETERIZATION  12/2010  . CARDIOVERSION  07/2011  . CARDIOVERSION N/A 04/18/2014   Procedure: CARDIOVERSION;  Surgeon: Dorothy Spark, MD;  Location: [place];  Service: Cardiovascular;  Laterality: N/A;  . CARDIOVERSION N/A 11/03/2015   Procedure: CARDIOVERSION;  Surgeon: Lelon Perla, MD;  Location: [facility] ENDOSCOPY;  Service: Cardiovascular;  Laterality: N/A;  . CARDIOVERSION N/A 05/08/2017   Procedure: CARDIOVERSION;  Surgeon: Dorothy Spark, MD;  Location: [facility] ENDOSCOPY;  Service: Cardiovascular;  Laterality: N/A;  . CARDIOVERSION N/A 07/28/2017   Procedure: CARDIOVERSION;  Surgeon: Dorothy Spark, MD;  Location: [place];  Service: Cardiovascular;  Laterality: N/A;  . LOWER EXTREMITY ANGIOGRAM N/A 06/08/2014   Procedure: LOWER EXTREMITY ANGIOGRAM;  Surgeon: Wellington Hampshire, MD;  Location: [facility] CATH LAB;  Service: Cardiovascular;  Laterality: N/A;  . PACEMAKER IMPLANT N/A 12/04/2017   Procedure: PACEMAKER IMPLANT;  Surgeon: Constance Haw, MD;  Location: [place] CV LAB;  Service: Cardiovascular;  Laterality: N/A;  . POPLITEAL ARTERY ANGIOPLASTY Right 06/08/2014   Archie Endo 06/08/2014  . RIGHT/LEFT HEART CATH AND CORONARY  ANGIOGRAPHY N/A 10/08/2017   Procedure: RIGHT/LEFT HEART CATH AND CORONARY ANGIOGRAPHY;  Surgeon: Burnell Blanks, MD;  Location: [place] CV LAB;  Service: Cardiovascular;  Laterality: N/A;  . SKIN CANCER EXCISION Bilateral    "have had them cut off back of neck X 2; off left upper arm; right wrist, near right shoulder blade" (06/08/2014)  . TEE WITHOUT CARDIOVERSION N/A 12/02/2017   Procedure: TRANSESOPHAGEAL  ECHOCARDIOGRAM (TEE);  Surgeon: Burnell Blanks, MD;  Location: [place];  Service: Open Heart Surgery;  Laterality: N/A;  . TEMPORARY PACEMAKER N/A 12/04/2017   Procedure: TEMPORARY PACEMAKER;  Surgeon: Leonie Man, MD;  Location: [place] CV LAB;  Service: Cardiovascular;  Laterality: N/A;  . TRANSCATHETER AORTIC VALVE REPLACEMENT, TRANSFEMORAL N/A 12/02/2017   Procedure: TRANSCATHETER AORTIC VALVE REPLACEMENT, TRANSFEMORAL;  Surgeon: Burnell Blanks, MD;  Location: [place];  Service: Open Heart Surgery;  Laterality: N/A;  using Edwards Sapien 3 Transcatheter Heart Valve size 22mm    Social History   Socioeconomic History  . Marital status: Married    Spouse name: [name]  . Number of children: 1  . Years of education: 24  . Highest education level: Not on file  Occupational History  . Occupation: retired    Fish farm manager: [employer]  . Financial resource strain: Not on file  . Food insecurity:    Worry: Not on file    Inability: Not on file  . Transportation needs:    Medical: Not on file    Non-medical: Not on file  Tobacco Use  . Smoking status: Former Smoker    Packs/day: 2.00    Years: 14.00    Pack years: 28.00    Types: Cigarettes    Last attempt to quit: 11/11/1972    Years since quitting: 45.4  . Smokeless tobacco: Never Used  Substance and Sexual Activity  . Alcohol use: No    Comment: quit in 1984  . Drug use: No  . Sexual activity: Not Currently  Lifestyle  . Physical activity:    Days per week: 0 days    Minutes per session: 0 min  . Stress: Not at all  Relationships  . Social connections:    Talks on phone: Not on file    Gets together: Not on file    Attends religious service: Not on file    Active member of club or organization: Not on file    Attends meetings of clubs or organizations: Not on file    Relationship status: Not on file  . Intimate partner violence:    Fear of current or ex partner: Not on file     Emotionally abused: Not on file    Physically abused: Not on file    Forced sexual activity: Not on file  Other Topics Concern  . 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   Patient has one child and his wife has one child.   Patient is retired.   Patient has a high school education.   Patient is right-handed.   Patient drinks very little caffeine.    Family History  Problem Relation Age of Onset  . Diabetes Mother   . Heart attack Mother   . Hypertension Mother   . Heart attack Father   . Heart failure Father   . Hypertension Father   . Diabetes Father   . Diabetes Sister   . Diabetes  Brother   . Diabetes Other   . Diabetes Daughter        TYPE ll  . Heart Problems Daughter   . Hypertension Sister   . Hypertension Brother   . Stroke Brother     Current Facility-Administered Medications  Medication Dose Route Frequency Provider Last Rate Last Dose  . acetaminophen (TYLENOL) tablet 650 mg  650 mg Oral Q6H PRN Norval Morton, MD       Or  . acetaminophen (TYLENOL) suppository 650 mg  650 mg Rectal Q6H PRN Smith, Rondell A, MD      . albuterol (PROVENTIL) (2.5 MG/3ML) 0.083% nebulizer solution 2.5 mg  2.5 mg Nebulization Q6H PRN Tamala Julian, Rondell A, MD      . amiodarone (PACERONE) tablet 200 mg  200 mg Oral Daily Tamala Julian, Rondell A, MD   200 mg at 04/04/18 0957  . aspirin EC tablet 81 mg  81 mg Oral Daily Tamala Julian, Rondell A, MD   81 mg at 04/04/18 0957  . atorvastatin (LIPITOR) tablet 80 mg  80 mg Oral QHS Smith, Rondell A, MD   80 mg at 04/03/18 2348  . cefTRIAXone (ROCEPHIN) 2 g in sodium chloride 0.9 % 100 mL IVPB  2 g Intravenous Q24H Norval Morton, MD   Stopped at 04/03/18 2310  . docusate sodium (COLACE) capsule 100 mg  100 mg Oral Daily PRN Fuller Plan A, MD      . fenofibrate tablet 160 mg  160 mg Oral Daily Tamala Julian, Rondell A, MD   160 mg at 04/04/18 0957  . gabapentin (NEURONTIN) capsule 300 mg  300 mg  Oral TID Fuller Plan A, MD   300 mg at 04/04/18 1600  . heparin ADULT infusion 100 units/mL (25000 units/228mL sodium chloride 0.45%)  1,600 Units/hr Intravenous Continuous Joselyn Glassman A, RPH 16 mL/hr at 04/04/18 1511 1,600 Units/hr at 04/04/18 1511  . hydrALAZINE (APRESOLINE) injection 10 mg  10 mg Intravenous Q4H PRN Fuller Plan A, MD      . HYDROcodone-acetaminophen (NORCO/VICODIN) 5-325 MG per tablet 1 tablet  1 tablet Oral Q6H PRN Norval Morton, MD   1 tablet at 04/03/18 2348  . insulin aspart (novoLOG) injection 0-15 Units  0-15 Units Subcutaneous TID WC Fuller Plan A, MD   5 Units at 04/04/18 1728  . insulin glargine (LANTUS) injection 65 Units  65 Units Subcutaneous QHS Norval Morton, MD   65 Units at 04/03/18 2342  . levothyroxine (SYNTHROID, LEVOTHROID) tablet 150 mcg  150 mcg Oral QAC breakfast Fuller Plan A, MD   150 mcg at 04/04/18 0707  . metoprolol tartrate (LOPRESSOR) tablet 75 mg  75 mg Oral BID Fuller Plan A, MD   75 mg at 04/04/18 0957  . metroNIDAZOLE (FLAGYL) IVPB 500 mg  500 mg Intravenous Q8H Norval Morton, MD   Stopped at 04/04/18 1059  . ondansetron (ZOFRAN) tablet 4 mg  4 mg Oral Q6H PRN Fuller Plan A, MD       Or  . ondansetron (ZOFRAN) injection 4 mg  4 mg Intravenous Q6H PRN Smith, Rondell A, MD      . vancomycin (VANCOCIN) 1,250 mg in sodium chloride 0.9 % 250 mL IVPB  1,250 mg Intravenous Q24H Arlyn Dunning M, RPH        No Known Allergies  REVIEW OF SYSTEMS (negative unless checked):   Cardiac:  []  Chest pain or chest pressure? []  Shortness of breath upon activity? []  Shortness of breath when lying flat? []   Irregular heart rhythm?  Vascular:  []  Pain in calf, thigh, or hip brought on by walking? []  Pain in feet at night that wakes you up from your sleep? []  Blood clot in your veins? []  Leg swelling?  Pulmonary:  []  Oxygen at home? []  Productive cough? []  Wheezing?  Neurologic:  []  Sudden weakness in arms or legs? []   Sudden numbness in arms or legs? []  Sudden onset of difficult speaking or slurred speech? []  Temporary loss of vision in one eye? []  Problems with dizziness?  Gastrointestinal:  []  Blood in stool? []  Vomited blood?  Genitourinary:  []  Burning when urinating? []  Blood in urine?  Psychiatric:  []  Major depression  Hematologic:  []  Bleeding problems? []  Problems with blood clotting?  Dermatologic:  []  Rashes or ulcers?  Constitutional:  []  Fever or chills?  Ear/Nose/Throat:  []  Change in hearing? []  Nose bleeds? []  Sore throat?  Musculoskeletal:  []  Back pain? []  Joint pain? []  Muscle pain?   For VQI Use Only   PRE-ADM LIVING Home  AMB STATUS Ambulatory  CAD Sx None  PRIOR CHF None  STRESS TEST No    Physical Examination     Vitals:   04/04/18 0021 04/04/18 0136 04/04/18 0557 04/04/18 1235  BP: (!) 170/78 130/72 111/64 (!) 147/62  Pulse: 60 60 60 60  Resp: 15   18  Temp:  97.9 F (36.6 C)  (!) 97.5 F (36.4 C)  TempSrc:  Oral  Oral  SpO2: 100% 98% 98% 99%  Weight:      Height:       Body mass index is 35.01 kg/m.  General Alert, O x 3, WD, NAD  [place]/AT,    Ear/Nose/ Throat Hearing grossly intact, nares without erythema or drainage, oropharynx without Erythema or Exudate, Mallampati score: 3,   Eyes PERRLA, EOMI,    Neck Supple, mid-line trachea,    Pulmonary Sym exp, good B air movt, CTA B  Cardiac RRR, Nl S1, S2, no Murmurs, No rubs, No S3,S4  Vascular Vessel Right Left  Radial Palpable Palpable  Brachial Palpable Palpable  Carotid Palpable, No Bruit Palpable, No Bruit  Aorta Not palpable N/A  Femoral Palpable Palpable  Popliteal Not palpable Not palpable  PT Faintly palpable Not palpable  DP Palpable Not palpable    Gastro- intestinal soft, non-distended, non-tender to palpation, No guarding or rebound, no HSM, no masses, no CVAT B, No palpable prominent aortic pulse,    Musculo- skeletal M/S 5/5 throughout  , Extremities without  ischemic changes except 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, No edema present, No visible varicosities , No Lipodermatosclerosis present, blanching erythema in dorsum of L foot (reported improved from presentation), no TTP light touch currently  Neurologic Cranial nerves 2-12 intact , Pain and light touch intact in extremities but decreased, Motor exam as listed above  Psychiatric Judgement intact, Mood & affect appropriate for pt's clinical situation  Dermatologic See M/S exam for extremity exam, No rashes otherwise noted  Lymphatic  Palpable lymph nodes: None      Non-Invasive Vascular Imaging   ABI ordered   Laboratory   CBC CBC Latest Ref Rng & Units 04/03/2018 04/03/2018 12/05/2017  WBC 4.0 - 10.5 K/uL - 7.9 12.3(H)  Hemoglobin 13.0 - 17.0 g/dL 12.2(L) 12.1(L) 11.9(L)  Hematocrit 39.0 - 52.0 % 36.0(L) 36.3(L) 35.9(L)  Platelets 150 - 400 K/uL - 185 95(L)    BMP BMP Latest Ref Rng & Units 04/03/2018 04/03/2018 12/24/2017  Glucose 65 - 99  mg/dL 248(H) 253(H) 211(H)  BUN 6 - 20 mg/dL 23(H) 23(H) 20  Creatinine 0.61 - 1.24 mg/dL 1.40(H) 1.40(H) 1.43(H)  BUN/Creat Ratio 10 - 24 - - 14  Sodium 135 - 145 mmol/L 138 137 139  Potassium 3.5 - 5.1 mmol/L 4.4 4.5 4.5  Chloride 101 - 111 mmol/L 102 102 101  CO2 22 - 32 mmol/L - 26 22  Calcium 8.9 - 10.3 mg/dL - 9.2 9.9    Coagulation Lab Results  Component Value Date   INR 1.29 04/03/2018   INR 1.19 12/02/2017   INR 1.02 11/28/2017   No results found for: PTT  Lipids    Component Value Date/Time   CHOL 155 10/13/2017 1224   TRIG 144 10/13/2017 1224   HDL 38 (L) 10/13/2017 1224   CHOLHDL 4.1 10/13/2017 1224   VLDL 32 (H) 05/01/2017 1216   [place] 93 10/13/2017 1224    Radiology     Dg Foot Complete Left  Result Date: 04/03/2018 CLINICAL DATA:  77 y/o M; crush injury 3 weeks ago with pain and swelling. Blackness of the third toe. EXAM: LEFT FOOT - COMPLETE 3+ VIEW COMPARISON:  None. FINDINGS: There is no evidence of fracture or  dislocation. There is no evidence of arthropathy or other focal bone abnormality. Vascular calcifications. IMPRESSION: No acute fracture or dislocation identified. Electronically Signed   By: Kristine Garbe M.D.   On: 04/03/2018 18:22    Medical Decision Making   Steven Maldonado is a 77 y.o. 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  I discussed with the patient the natural history of critical limb ischemia: 25% require amputation in one year, 50% are able to maintain their limbs in one year, and 25-30% die in one year due to comorbidities.  Given L foot gangrene, will need:  Aortogram, LRo, possible intervention . I discussed with the patient the nature of angiographic procedures, especially the limited patencies of any endovascular intervention. . The patient is aware of that the risks of an angiographic procedure include but are not limited to: bleeding, infection, access site complications, embolization, rupture of treated vessel, dissection, possible need for emergent surgical intervention, and possible need for surgical procedures to treat the patient's pathology. . The patient is aware of the risks and agrees to proceed.    Due to holiday schedule, suspect cannot be scheduled before Wed/Thursday.    I discussed in depth with the patient the nature of atherosclerosis, and emphasized the importance of maximal medical management including strict control of blood pressure, blood glucose, and lipid levels, antiplatelet agents, obtaining regular exercise, and cessation of smoking.  The patient is aware that without maximal medical management the underlying atherosclerotic disease process will progress, limiting the benefit of any interventions.  The patient is currently on a statin: Lipitor.   The patient is currently on  an anti-platelet: ASA.  Thank you for allowing [place] to participate in this patient's care.   Adele Barthel, MD, FACS [facility] Office: [phone] Pager: [phone]  04/04/2018, 5:46 PM

## 2018-04-04 NOTE — Progress Notes (Addendum)
PROGRESS NOTE        PATIENT DETAILS Name: Steven Maldonado Age: 76 y.o. Sex: male Date of Birth: 1941-11-06 Admit Date: 04/03/2018 Admitting Physician Norval Morton, MD [id number], Lonie Peak, [name]  Brief Narrative: Patient is a 77 y.o. male history of PAD (prior history of PCI to right popliteal artery) atrial fibrillation on anticoagulation, severe aortic stenosis status post TAVR, history of complete AV block requiring pacemaker placement presenting with left third toe necrotic changes after a can and drop on his foot approximately 3 weeks back.  He was subsequently admitted to the hospital service for further IV antibiotics and vascular surgery evaluation.   Subjective: Lying comfortably in bed-no chest pain or shortness of breath.  Left third toe appears unchanged.  Assessment/Plan: Necrotic changes to left third toe: Continue empiric vancomycin/Rocephin and Flagyl-await ABI-await further recommendations from vascular surgery.  Patient with known history of underlying PAD requiring PCI to right popliteal artery a few years back.  Blood cultures pending.  Insulin-dependent DM-2: CBG stable, continue Lantus 65 units daily, and SSI.  Follow and adjust accordingly  Dyslipidemia: Continue statin  CAD: No anginal symptoms-continue aspirin, statin metoprolol  PAF: Rate controlled with amiodarone, metoprolol-continue to hold Pradaxa in case patient requires vascular intervention-we will start heparin per pharmacy while inpatient.  History of severe aortic stenosis status post TAVR  CKD stage III: Creatinine close to usual baseline-follow.  Hypothyroidism: Continue levothyroxine and TSH elevated at 20-but a few months ago was 53.  Continue current dosing of levothyroxine and follow-up with PCP for further continued care.  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-continue Neurontin  OSA: Continue CPAP nightly  DVT Prophylaxis: Full dose anticoagulation with  Heparin  Code Status: Full code   Family Communication: None at bedside  Disposition Plan: Remain inpatient  Antimicrobial agents: Anti-infectives (From admission, onward)   Start     Dose/Rate Route Frequency Ordered Stop   04/04/18 2200  vancomycin (VANCOCIN) 1,250 mg in sodium chloride 0.9 % 250 mL IVPB     1,250 mg 166.7 mL/hr over 90 Minutes Intravenous Every 24 hours 04/03/18 2158     04/04/18 0300  metroNIDAZOLE (FLAGYL) IVPB 500 mg     500 mg 100 mL/hr over 60 Minutes Intravenous Every 8 hours 04/04/18 0204     04/03/18 2200  cefTRIAXone (ROCEPHIN) 2 g in sodium chloride 0.9 % 100 mL IVPB     2 g 200 mL/hr over 30 Minutes Intravenous Every 24 hours 04/03/18 2149     04/03/18 2200  metroNIDAZOLE (FLAGYL) IVPB 500 mg  Status:  Discontinued     500 mg 100 mL/hr over 60 Minutes Intravenous Every 8 hours 04/03/18 2149 04/04/18 0204   04/03/18 2200  vancomycin (VANCOCIN) 2,000 mg in sodium chloride 0.9 % 500 mL IVPB     2,000 mg 250 mL/hr over 120 Minutes Intravenous  Once 04/03/18 2158 04/04/18 0142      Procedures: None  CONSULTS:  vascular surgery  Time spent: 25- minutes-Greater than 50% of this time was spent in counseling, explanation of diagnosis, planning of further management, and coordination of care.  MEDICATIONS: Scheduled Meds: . amiodarone  200 mg Oral Daily  . aspirin EC  81 mg Oral Daily  . atorvastatin  80 mg Oral QHS  . fenofibrate  160 mg Oral Daily  . gabapentin  300 mg Oral  TID  . insulin aspart  0-15 Units Subcutaneous TID WC  . insulin glargine  65 Units Subcutaneous QHS  . levothyroxine  150 mcg Oral QAC breakfast  . metoprolol tartrate  75 mg Oral BID   Continuous Infusions: . cefTRIAXone (ROCEPHIN)  IV Stopped (04/03/18 2310)  . metronidazole 500 mg (04/04/18 0959)  . vancomycin     PRN Meds:.acetaminophen **OR** acetaminophen, albuterol, docusate sodium, hydrALAZINE, HYDROcodone-acetaminophen, ondansetron **OR** ondansetron  (ZOFRAN) IV   PHYSICAL EXAM: Vital signs: Vitals:   04/04/18 0021 04/04/18 0136 04/04/18 0557 04/04/18 1235  BP: (!) 170/78 130/72 111/64 (!) 147/62  Pulse: 60 60 60 60  Resp: 15   18  Temp:  97.9 F (36.6 C)  (!) 97.5 F (36.4 C)  TempSrc:  Oral  Oral  SpO2: 100% 98% 98% 99%  Weight:      Height:       Filed Weights   04/03/18 1802  Weight: 113.9 kg (251 lb)   Body mass index is 35.01 kg/m.   General appearance :Awake, alert, not in any distress.  HEENT: Atraumatic and Normocephalic Neck: supple Resp:Good air entry bilaterally, no added sounds  CVS: S1 S2 regular, no murmurs.  GI: Bowel sounds present, Non tender and not distended with no gaurding, rigidity or rebound.No organomegaly Extremities: B/L Lower Ext shows no edema, both legs are warm to touch.Left 3rd toe-black discoloration Neurology:  speech clear,Non focal, sensation is grossly intact. Musculoskeletal:No digital cyanosis Skin:No Rash, warm and dry Wounds:N/A  I have personally reviewed following labs and imaging studies  LABORATORY DATA: CBC: Recent Labs  Lab 04/03/18 1945 04/03/18 1957  WBC 7.9  --   HGB 12.1* 12.2*  HCT 36.3* 36.0*  MCV 91.9  --   PLT 185  --     Basic Metabolic Panel: Recent Labs  Lab 04/03/18 1945 04/03/18 1957  NA 137 138  K 4.5 4.4  CL 102 102  CO2 26  --   GLUCOSE 253* 248*  BUN 23* 23*  CREATININE 1.40* 1.40*  CALCIUM 9.2  --     GFR: Estimated Creatinine Clearance: 59.4 mL/min (A) (by C-G formula based on SCr of 1.4 mg/dL (H)).  Liver Function Tests: Recent Labs  Lab 04/03/18 1945  AST 18  ALT 19  ALKPHOS 47  BILITOT 0.2*  PROT 7.1  ALBUMIN 3.4*   No results for input(s): LIPASE, AMYLASE in the last 168 hours. No results for input(s): AMMONIA in the last 168 hours.  Coagulation Profile: Recent Labs  Lab 04/03/18 1945  INR 1.29    Cardiac Enzymes: No results for input(s): CKTOTAL, CKMB, CKMBINDEX, TROPONINI in the last 168 hours.  BNP  (last 3 results) Recent Labs    12/11/17 1522  PROBNP 271    HbA1C: No results for input(s): HGBA1C in the last 72 hours.  CBG: Recent Labs  Lab 04/04/18 0701 04/04/18 1236  GLUCAP 107* 135*    Lipid Profile: No results for input(s): CHOL, HDL, LDLCALC, TRIG, CHOLHDL, LDLDIRECT in the last 72 hours.  Thyroid Function Tests: No results for input(s): TSH, T4TOTAL, FREET4, T3FREE, THYROIDAB in the last 72 hours.  Anemia Panel: No results for input(s): VITAMINB12, FOLATE, FERRITIN, TIBC, IRON, RETICCTPCT in the last 72 hours.  Urine analysis:    Component Value Date/Time   COLORURINE YELLOW 11/28/2017 1128   APPEARANCEUR HAZY (A) 11/28/2017 1128   LABSPEC 1.022 11/28/2017 1128   PHURINE 5.0 11/28/2017 1128   GLUCOSEU >=500 (A) 11/28/2017 1128   HGBUR  NEGATIVE 11/28/2017 [place] 11/28/2017 1128   [place] 11/28/2017 1128   PROTEINUR NEGATIVE 11/28/2017 1128   UROBILINOGEN 0.2 01/02/2015 1559   NITRITE NEGATIVE 11/28/2017 1128   LEUKOCYTESUR LARGE (A) 11/28/2017 1128    Sepsis Labs: Lactic Acid, Venous No results found for: LATICACIDVEN  MICROBIOLOGY: No results found for this or any previous visit (from the past 240 hour(s)).  RADIOLOGY STUDIES/RESULTS: Dg Foot Complete Left  Result Date: 04/03/2018 CLINICAL DATA:  77 y/o M; crush injury 3 weeks ago with pain and swelling. Blackness of the third toe. EXAM: LEFT FOOT - COMPLETE 3+ VIEW COMPARISON:  None. FINDINGS: There is no evidence of fracture or dislocation. There is no evidence of arthropathy or other focal bone abnormality. Vascular calcifications. IMPRESSION: No acute fracture or dislocation identified. Electronically Signed   By: Kristine Garbe M.D.   On: 04/03/2018 18:22     LOS: 1 day   Oren Binet, MD  [facility]  If 7PM-7AM, please contact night-coverage  Please page via [email or web address]-click on MD name and type text  message  04/04/2018, 1:40 PM

## 2018-04-04 NOTE — Progress Notes (Signed)
ANTICOAGULATION CONSULT NOTE - Initial Consult  Pharmacy Consult for heparin Indication: atrial fibrillation  No Known Allergies  Patient Measurements: Height: 5\' 11"  (180.3 cm) Weight: 251 lb (113.9 kg) IBW/kg (Calculated) : 75.3 Heparin Dosing Weight: 100kg  Vital Signs: Temp: 97.5 F (36.4 C) (05/25 1235) Temp Source: Oral (05/25 1235) BP: 147/62 (05/25 1235) Pulse Rate: 60 (05/25 1235)  Labs: Recent Labs    04/03/18 1945 04/03/18 1957  HGB 12.1* 12.2*  HCT 36.3* 36.0*  PLT 185  --   APTT 42*  --   LABPROT 16.0*  --   INR 1.29  --   CREATININE 1.40* 1.40*    Estimated Creatinine Clearance: 59.4 mL/min (A) (by C-G formula based on SCr of 1.4 mg/dL (H)).   Medical History: Past Medical History:  Diagnosis Date  . Chronic diastolic CHF (congestive heart failure) ([place])   . CKD (chronic kidney disease), stage III ([place])   . Coronary artery disease    a. Cath February 2012 in [facility], occluded RCA with collaterals  . DM type 2 (diabetes mellitus, type 2) ([place])   . Essential hypertension   . Hyperlipidemia   . Pacemaker    a. symptomatic brady after TAVR s/p MDT PPM by Dr. Curt Bears 12/04/17  . Persistent atrial fibrillation ([place])   . PVD (peripheral vascular disease) ([place])    a. s/p R popliteal artery stenosis tx with drug-coated balloon 05/2014, followed by Dr. Fletcher Anon.  . 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 Severe aortic stenosis    a. 12/02/17: s/p TAVR  . Skin cancer   . Sleep apnea with use of continuous positive airway pressure (CPAP)    04-11-11 AHI was 32.9 and titrated to 15 cm H20, DME is [facility]  . Subclinical hypothyroidism      Assessment: 77 yo male patient with afib on dabigatran PTA. Md wishes to initiate heparin drip to bridge patient with possible intervention planned. Last dose of dabigatran was 5/24 at 1000. Will give  small bolus to patient due to previous dabigatran therapy.   Goal of Therapy:  Heparin level 0.3-0.7 Monitor platelets by anticoagulation protocol: Yes   Plan:  Heparin 3000 units IV bolus x 1 Heparin drip at 1600 units/hr Heparin level in 8 hours Heparin level and CBC daily while on heparin Monitor for s/sx of bleeding  Nadim Malia A. Levada Dy, PharmD, [place] Pager: [phone]  04/04/2018,2:17 PM

## 2018-04-04 NOTE — Progress Notes (Signed)
   Daily Progress Note   Will see patient later.  BLE ABI ordered.  No intervention planned this weekend.   Adele Barthel, MD, FACS [facility] [place] Office: [phone] Pager: [phone]  04/04/2018, 1:27 PM

## 2018-04-04 NOTE — H&P (View-Only) (Signed)
Requested by:  Dr. Charlean Merl  Reason for consultation: left 3rd toe ischemia   History of Present Illness   Steven Maldonado is a 77 y.o. (November 17, 1940) male who presents with chief complaint: black toe L foot.  Onset of symptoms occurred 3 weeks ago when patient dropped a can on his L foot.  Initially, the L 3rd toe was only purple but the toe never improved in appearance, becoming black.  Pt reported baseline has bilateral neuropathic pain > intermittent claudication.  He didn't have significant pain in the L 3rd toe until he began developing some redness around the toe.  Pain is described as sharp, severity 3-6/10, and associated with manipulating toe.  Patient has attempted to treat this pain with rest.  The patient has no rest pain symptoms.  Patient denies any fever or chills.  He denies any drainage form the L 3rd toe.  Dr. Fletcher Anon has completed a Pop artery intervention previously.  Atherosclerotic risk factors include: HLD, HTN, distant former smoker.  Past Medical History:  Diagnosis Date  . Chronic diastolic CHF (congestive heart failure) ([place])   . CKD (chronic kidney disease), stage III ([place])   . Coronary artery disease    a. Cath February 2012 in [facility], occluded RCA with collaterals  . DM type 2 (diabetes mellitus, type 2) ([place])   . Essential hypertension   . Hyperlipidemia   . Pacemaker    a. symptomatic brady after TAVR s/p MDT PPM by Dr. Curt Bears 12/04/17  . Persistent atrial fibrillation ([place])   . PVD (peripheral vascular disease) ([place])    a. s/p R popliteal artery stenosis tx with drug-coated balloon 05/2014, followed by Dr. Fletcher Anon.  . 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 Severe aortic stenosis    a. 12/02/17: s/p TAVR  . Skin cancer   . Sleep apnea with use of continuous positive airway pressure (CPAP)    04-11-11 AHI was 32.9 and  titrated to 15 cm H20, DME is [facility]  . Subclinical hypothyroidism     Past Surgical History:  Procedure Laterality Date  . ABDOMINAL ANGIOGRAM N/A 06/08/2014   Procedure: ABDOMINAL ANGIOGRAM;  Surgeon: Wellington Hampshire, MD;  Location: [facility] CATH LAB;  Service: Cardiovascular;  Laterality: N/A;  . APPENDECTOMY  1965  . CARDIAC CATHETERIZATION  12/2010  . CARDIOVERSION  07/2011  . CARDIOVERSION N/A 04/18/2014   Procedure: CARDIOVERSION;  Surgeon: Dorothy Spark, MD;  Location: [place];  Service: Cardiovascular;  Laterality: N/A;  . CARDIOVERSION N/A 11/03/2015   Procedure: CARDIOVERSION;  Surgeon: Lelon Perla, MD;  Location: [facility] ENDOSCOPY;  Service: Cardiovascular;  Laterality: N/A;  . CARDIOVERSION N/A 05/08/2017   Procedure: CARDIOVERSION;  Surgeon: Dorothy Spark, MD;  Location: [facility] ENDOSCOPY;  Service: Cardiovascular;  Laterality: N/A;  . CARDIOVERSION N/A 07/28/2017   Procedure: CARDIOVERSION;  Surgeon: Dorothy Spark, MD;  Location: [place];  Service: Cardiovascular;  Laterality: N/A;  . LOWER EXTREMITY ANGIOGRAM N/A 06/08/2014   Procedure: LOWER EXTREMITY ANGIOGRAM;  Surgeon: Wellington Hampshire, MD;  Location: [facility] CATH LAB;  Service: Cardiovascular;  Laterality: N/A;  . PACEMAKER IMPLANT N/A 12/04/2017   Procedure: PACEMAKER IMPLANT;  Surgeon: Constance Haw, MD;  Location: [place] CV LAB;  Service: Cardiovascular;  Laterality: N/A;  . POPLITEAL ARTERY ANGIOPLASTY Right 06/08/2014   Archie Endo 06/08/2014  . RIGHT/LEFT HEART CATH AND CORONARY  ANGIOGRAPHY N/A 10/08/2017   Procedure: RIGHT/LEFT HEART CATH AND CORONARY ANGIOGRAPHY;  Surgeon: Burnell Blanks, MD;  Location: [place] CV LAB;  Service: Cardiovascular;  Laterality: N/A;  . SKIN CANCER EXCISION Bilateral    "have had them cut off back of neck X 2; off left upper arm; right wrist, near right shoulder blade" (06/08/2014)  . TEE WITHOUT CARDIOVERSION N/A 12/02/2017   Procedure: TRANSESOPHAGEAL  ECHOCARDIOGRAM (TEE);  Surgeon: Burnell Blanks, MD;  Location: [place];  Service: Open Heart Surgery;  Laterality: N/A;  . TEMPORARY PACEMAKER N/A 12/04/2017   Procedure: TEMPORARY PACEMAKER;  Surgeon: Leonie Man, MD;  Location: [place] CV LAB;  Service: Cardiovascular;  Laterality: N/A;  . TRANSCATHETER AORTIC VALVE REPLACEMENT, TRANSFEMORAL N/A 12/02/2017   Procedure: TRANSCATHETER AORTIC VALVE REPLACEMENT, TRANSFEMORAL;  Surgeon: Burnell Blanks, MD;  Location: [place];  Service: Open Heart Surgery;  Laterality: N/A;  using Edwards Sapien 3 Transcatheter Heart Valve size 20mm    Social History   Socioeconomic History  . Marital status: Married    Spouse name: [name]  . Number of children: 1  . Years of education: 15  . Highest education level: Not on file  Occupational History  . Occupation: retired    Fish farm manager: [employer]  . Financial resource strain: Not on file  . Food insecurity:    Worry: Not on file    Inability: Not on file  . Transportation needs:    Medical: Not on file    Non-medical: Not on file  Tobacco Use  . Smoking status: Former Smoker    Packs/day: 2.00    Years: 14.00    Pack years: 28.00    Types: Cigarettes    Last attempt to quit: 11/11/1972    Years since quitting: 45.4  . Smokeless tobacco: Never Used  Substance and Sexual Activity  . Alcohol use: No    Comment: quit in 1984  . Drug use: No  . Sexual activity: Not Currently  Lifestyle  . Physical activity:    Days per week: 0 days    Minutes per session: 0 min  . Stress: Not at all  Relationships  . Social connections:    Talks on phone: Not on file    Gets together: Not on file    Attends religious service: Not on file    Active member of club or organization: Not on file    Attends meetings of clubs or organizations: Not on file    Relationship status: Not on file  . Intimate partner violence:    Fear of current or ex partner: Not on file     Emotionally abused: Not on file    Physically abused: Not on file    Forced sexual activity: Not on file  Other Topics Concern  . 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   Patient has one child and his wife has one child.   Patient is retired.   Patient has a high school education.   Patient is right-handed.   Patient drinks very little caffeine.    Family History  Problem Relation Age of Onset  . Diabetes Mother   . Heart attack Mother   . Hypertension Mother   . Heart attack Father   . Heart failure Father   . Hypertension Father   . Diabetes Father   . Diabetes Sister   . Diabetes  Brother   . Diabetes Other   . Diabetes Daughter        TYPE ll  . Heart Problems Daughter   . Hypertension Sister   . Hypertension Brother   . Stroke Brother     Current Facility-Administered Medications  Medication Dose Route Frequency Provider Last Rate Last Dose  . acetaminophen (TYLENOL) tablet 650 mg  650 mg Oral Q6H PRN Norval Morton, MD       Or  . acetaminophen (TYLENOL) suppository 650 mg  650 mg Rectal Q6H PRN Smith, Rondell A, MD      . albuterol (PROVENTIL) (2.5 MG/3ML) 0.083% nebulizer solution 2.5 mg  2.5 mg Nebulization Q6H PRN Tamala Julian, Rondell A, MD      . amiodarone (PACERONE) tablet 200 mg  200 mg Oral Daily Tamala Julian, Rondell A, MD   200 mg at 04/04/18 0957  . aspirin EC tablet 81 mg  81 mg Oral Daily Tamala Julian, Rondell A, MD   81 mg at 04/04/18 0957  . atorvastatin (LIPITOR) tablet 80 mg  80 mg Oral QHS Smith, Rondell A, MD   80 mg at 04/03/18 2348  . cefTRIAXone (ROCEPHIN) 2 g in sodium chloride 0.9 % 100 mL IVPB  2 g Intravenous Q24H Norval Morton, MD   Stopped at 04/03/18 2310  . docusate sodium (COLACE) capsule 100 mg  100 mg Oral Daily PRN Fuller Plan A, MD      . fenofibrate tablet 160 mg  160 mg Oral Daily Tamala Julian, Rondell A, MD   160 mg at 04/04/18 0957  . gabapentin (NEURONTIN) capsule 300 mg  300 mg  Oral TID Fuller Plan A, MD   300 mg at 04/04/18 1600  . heparin ADULT infusion 100 units/mL (25000 units/231mL sodium chloride 0.45%)  1,600 Units/hr Intravenous Continuous Joselyn Glassman A, RPH 16 mL/hr at 04/04/18 1511 1,600 Units/hr at 04/04/18 1511  . hydrALAZINE (APRESOLINE) injection 10 mg  10 mg Intravenous Q4H PRN Fuller Plan A, MD      . HYDROcodone-acetaminophen (NORCO/VICODIN) 5-325 MG per tablet 1 tablet  1 tablet Oral Q6H PRN Norval Morton, MD   1 tablet at 04/03/18 2348  . insulin aspart (novoLOG) injection 0-15 Units  0-15 Units Subcutaneous TID WC Fuller Plan A, MD   5 Units at 04/04/18 1728  . insulin glargine (LANTUS) injection 65 Units  65 Units Subcutaneous QHS Norval Morton, MD   65 Units at 04/03/18 2342  . levothyroxine (SYNTHROID, LEVOTHROID) tablet 150 mcg  150 mcg Oral QAC breakfast Fuller Plan A, MD   150 mcg at 04/04/18 0707  . metoprolol tartrate (LOPRESSOR) tablet 75 mg  75 mg Oral BID Fuller Plan A, MD   75 mg at 04/04/18 0957  . metroNIDAZOLE (FLAGYL) IVPB 500 mg  500 mg Intravenous Q8H Norval Morton, MD   Stopped at 04/04/18 1059  . ondansetron (ZOFRAN) tablet 4 mg  4 mg Oral Q6H PRN Fuller Plan A, MD       Or  . ondansetron (ZOFRAN) injection 4 mg  4 mg Intravenous Q6H PRN Smith, Rondell A, MD      . vancomycin (VANCOCIN) 1,250 mg in sodium chloride 0.9 % 250 mL IVPB  1,250 mg Intravenous Q24H Arlyn Dunning M, RPH        No Known Allergies  REVIEW OF SYSTEMS (negative unless checked):   Cardiac:  []  Chest pain or chest pressure? []  Shortness of breath upon activity? []  Shortness of breath when lying flat? []   Irregular heart rhythm?  Vascular:  []  Pain in calf, thigh, or hip brought on by walking? []  Pain in feet at night that wakes you up from your sleep? []  Blood clot in your veins? []  Leg swelling?  Pulmonary:  []  Oxygen at home? []  Productive cough? []  Wheezing?  Neurologic:  []  Sudden weakness in arms or legs? []   Sudden numbness in arms or legs? []  Sudden onset of difficult speaking or slurred speech? []  Temporary loss of vision in one eye? []  Problems with dizziness?  Gastrointestinal:  []  Blood in stool? []  Vomited blood?  Genitourinary:  []  Burning when urinating? []  Blood in urine?  Psychiatric:  []  Major depression  Hematologic:  []  Bleeding problems? []  Problems with blood clotting?  Dermatologic:  []  Rashes or ulcers?  Constitutional:  []  Fever or chills?  Ear/Nose/Throat:  []  Change in hearing? []  Nose bleeds? []  Sore throat?  Musculoskeletal:  []  Back pain? []  Joint pain? []  Muscle pain?   For VQI Use Only   PRE-ADM LIVING Home  AMB STATUS Ambulatory  CAD Sx None  PRIOR CHF None  STRESS TEST No    Physical Examination     Vitals:   04/04/18 0021 04/04/18 0136 04/04/18 0557 04/04/18 1235  BP: (!) 170/78 130/72 111/64 (!) 147/62  Pulse: 60 60 60 60  Resp: 15   18  Temp:  97.9 F (36.6 C)  (!) 97.5 F (36.4 C)  TempSrc:  Oral  Oral  SpO2: 100% 98% 98% 99%  Weight:      Height:       Body mass index is 35.01 kg/m.  General Alert, O x 3, WD, NAD  Head [place]/AT,    Ear/Nose/ Throat Hearing grossly intact, nares without erythema or drainage, oropharynx without Erythema or Exudate, Mallampati score: 3,   Eyes PERRLA, EOMI,    Neck Supple, mid-line trachea,    Pulmonary Sym exp, good B air movt, CTA B  Cardiac RRR, Nl S1, S2, no Murmurs, No rubs, No S3,S4  Vascular Vessel Right Left  Radial Palpable Palpable  Brachial Palpable Palpable  Carotid Palpable, No Bruit Palpable, No Bruit  Aorta Not palpable N/A  Femoral Palpable Palpable  Popliteal Not palpable Not palpable  PT Faintly palpable Not palpable  DP Palpable Not palpable    Gastro- intestinal soft, non-distended, non-tender to palpation, No guarding or rebound, no HSM, no masses, no CVAT B, No palpable prominent aortic pulse,    Musculo- skeletal M/S 5/5 throughout  , Extremities without  ischemic changes except 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, No edema present, No visible varicosities , No Lipodermatosclerosis present, blanching erythema in dorsum of L foot (reported improved from presentation), no TTP light touch currently  Neurologic Cranial nerves 2-12 intact , Pain and light touch intact in extremities but decreased, Motor exam as listed above  Psychiatric Judgement intact, Mood & affect appropriate for pt's clinical situation  Dermatologic See M/S exam for extremity exam, No rashes otherwise noted  Lymphatic  Palpable lymph nodes: None      Non-Invasive Vascular Imaging   ABI ordered   Laboratory   CBC CBC Latest Ref Rng & Units 04/03/2018 04/03/2018 12/05/2017  WBC 4.0 - 10.5 K/uL - 7.9 12.3(H)  Hemoglobin 13.0 - 17.0 g/dL 12.2(L) 12.1(L) 11.9(L)  Hematocrit 39.0 - 52.0 % 36.0(L) 36.3(L) 35.9(L)  Platelets 150 - 400 K/uL - 185 95(L)    BMP BMP Latest Ref Rng & Units 04/03/2018 04/03/2018 12/24/2017  Glucose 65 - 99  mg/dL 248(H) 253(H) 211(H)  BUN 6 - 20 mg/dL 23(H) 23(H) 20  Creatinine 0.61 - 1.24 mg/dL 1.40(H) 1.40(H) 1.43(H)  BUN/Creat Ratio 10 - 24 - - 14  Sodium 135 - 145 mmol/L 138 137 139  Potassium 3.5 - 5.1 mmol/L 4.4 4.5 4.5  Chloride 101 - 111 mmol/L 102 102 101  CO2 22 - 32 mmol/L - 26 22  Calcium 8.9 - 10.3 mg/dL - 9.2 9.9    Coagulation Lab Results  Component Value Date   INR 1.29 04/03/2018   INR 1.19 12/02/2017   INR 1.02 11/28/2017   No results found for: PTT  Lipids    Component Value Date/Time   CHOL 155 10/13/2017 1224   TRIG 144 10/13/2017 1224   HDL 38 (L) 10/13/2017 1224   CHOLHDL 4.1 10/13/2017 1224   VLDL 32 (H) 05/01/2017 1216   [place] 93 10/13/2017 1224    Radiology     Dg Foot Complete Left  Result Date: 04/03/2018 CLINICAL DATA:  77 y/o M; crush injury 3 weeks ago with pain and swelling. Blackness of the third toe. EXAM: LEFT FOOT - COMPLETE 3+ VIEW COMPARISON:  None. FINDINGS: There is no evidence of fracture or  dislocation. There is no evidence of arthropathy or other focal bone abnormality. Vascular calcifications. IMPRESSION: No acute fracture or dislocation identified. Electronically Signed   By: Kristine Garbe M.D.   On: 04/03/2018 18:22    Medical Decision Making   BERTIS HUSTEAD is a 77 y.o. 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  I discussed with the patient the natural history of critical limb ischemia: 25% require amputation in one year, 50% are able to maintain their limbs in one year, and 25-30% die in one year due to comorbidities.  Given L foot gangrene, will need:  Aortogram, LRo, possible intervention . I discussed with the patient the nature of angiographic procedures, especially the limited patencies of any endovascular intervention. . The patient is aware of that the risks of an angiographic procedure include but are not limited to: bleeding, infection, access site complications, embolization, rupture of treated vessel, dissection, possible need for emergent surgical intervention, and possible need for surgical procedures to treat the patient's pathology. . The patient is aware of the risks and agrees to proceed.    Due to holiday schedule, suspect cannot be scheduled before Wed/Thursday.    I discussed in depth with the patient the nature of atherosclerosis, and emphasized the importance of maximal medical management including strict control of blood pressure, blood glucose, and lipid levels, antiplatelet agents, obtaining regular exercise, and cessation of smoking.  The patient is aware that without maximal medical management the underlying atherosclerotic disease process will progress, limiting the benefit of any interventions.  The patient is currently on a statin: Lipitor.   The patient is currently on  an anti-platelet: ASA.  Thank you for allowing [place] to participate in this patient's care.   Adele Barthel, MD, FACS [facility] [place] Office: [phone] Pager: [phone]  04/04/2018, 5:46 PM

## 2018-04-05 ENCOUNTER — Inpatient Hospital Stay (HOSPITAL_COMMUNITY): Payer: Medicare Other

## 2018-04-05 DIAGNOSIS — I998 Other disorder of circulatory system: Secondary | ICD-10-CM

## 2018-04-05 LAB — CBC
HCT: 35.8 % — ABNORMAL LOW (ref 39.0–52.0)
Hemoglobin: 11.9 g/dL — ABNORMAL LOW (ref 13.0–17.0)
MCH: 30.3 pg (ref 26.0–34.0)
MCHC: 33.2 g/dL (ref 30.0–36.0)
MCV: 91.1 fL (ref 78.0–100.0)
Platelets: 166 10*3/uL (ref 150–400)
RBC: 3.93 MIL/uL — ABNORMAL LOW (ref 4.22–5.81)
RDW: 12.9 % (ref 11.5–15.5)
WBC: 5.5 10*3/uL (ref 4.0–10.5)

## 2018-04-05 LAB — HEPARIN LEVEL (UNFRACTIONATED)
Heparin Unfractionated: 0.86 IU/mL — ABNORMAL HIGH (ref 0.30–0.70)
Heparin Unfractionated: 0.33 IU/mL (ref 0.30–0.70)
Heparin Unfractionated: 0.86 IU/mL — ABNORMAL HIGH (ref 0.30–0.70)

## 2018-04-05 LAB — GLUCOSE, CAPILLARY
Glucose-Capillary: 187 mg/dL — ABNORMAL HIGH (ref 65–99)
Glucose-Capillary: 225 mg/dL — ABNORMAL HIGH (ref 65–99)
Glucose-Capillary: 209 mg/dL — ABNORMAL HIGH (ref 65–99)
Glucose-Capillary: 200 mg/dL — ABNORMAL HIGH (ref 65–99)

## 2018-04-05 LAB — BASIC METABOLIC PANEL
Anion gap: 10 (ref 5–15)
BUN: 18 mg/dL (ref 6–20)
CO2: 24 mmol/L (ref 22–32)
Calcium: 8.9 mg/dL (ref 8.9–10.3)
Chloride: 104 mmol/L (ref 101–111)
Creatinine, Ser: 1.31 mg/dL — ABNORMAL HIGH (ref 0.61–1.24)
GFR calc Af Amer: >60 mL/min (ref >60)
GFR calc non Af Amer: 52 mL/min — ABNORMAL LOW (ref >60)
Glucose, Bld: 178 mg/dL — ABNORMAL HIGH (ref 65–99)
Potassium: 3.7 mmol/L (ref 3.5–5.1)
Sodium: 138 mmol/L (ref 135–145)

## 2018-04-05 MED ORDER — WHITE PETROLATUM EX OINT
TOPICAL_OINTMENT | CUTANEOUS | Status: AC
  Administered 2018-04-05: 13:00:00
  Filled 2018-04-05: qty 28.35

## 2018-04-05 NOTE — Progress Notes (Signed)
Pt has is home unit CPAP, pt states that he does not need any help with it.

## 2018-04-05 NOTE — Progress Notes (Signed)
[place] for heparin Indication: atrial fibrillation  No Known Allergies  Patient Measurements: Height: 5\' 11"  (180.3 cm) Weight: 251 lb (113.9 kg) IBW/kg (Calculated) : 75.3 Heparin Dosing Weight: 100kg  Vital Signs: Temp: 98.7 F (37.1 C) (05/26 0500) Temp Source: Axillary (05/26 0500) BP: 137/59 (05/26 0500) Pulse Rate: 59 (05/26 0500)  Labs: Recent Labs    04/03/18 1945 04/03/18 1957 04/04/18 2246 04/05/18 0546  HGB 12.1* 12.2*  --  11.9*  HCT 36.3* 36.0*  --  35.8*  PLT 185  --   --  166  APTT 42*  --   --   --   LABPROT 16.0*  --   --   --   INR 1.29  --   --   --   HEPARINUNFRC  --   --  0.43 0.86*  CREATININE 1.40* 1.40*  --  1.31*    Estimated Creatinine Clearance: 63.5 mL/min (A) (by C-G formula based on SCr of 1.31 mg/dL (H)).  Assessment: 76 y.o. male with Afib, Pradaxa on hold, for heparin. Heparin level elevated this morning. Per RN, no one spoke to her before drawing labs; normally they notify and have the RN stop the IV in the opposite arm that heparin is running in order to check a level. Unclear if drawn from heparin line, but will dose reduce and recheck a level.  Heparin level supratherapeutic, 0.86; no signs/sx bleeding noted by RN   Goal of Therapy:  Heparin level 0.3-0.7 Monitor platelets by anticoagulation protocol: Yes   Plan:  Decrease heparin gtt to 1500 units/hr 8 hour heparin level Daily heparin level, CBC Monitor clinical course, s/sx bleeding  Nida Boatman, PharmD PGY1 Acute Care Pharmacy Resident 04/05/2018,7:20 AM

## 2018-04-05 NOTE — Progress Notes (Signed)
ANTICOAGULATION CONSULT NOTE   Pharmacy Consult for heparin Indication: atrial fibrillation  No Known Allergies  Patient Measurements: Height: 5\' 11"  (180.3 cm) Weight: 251 lb (113.9 kg) IBW/kg (Calculated) : 75.3 Heparin Dosing Weight: 100kg  Vital Signs: Temp: 97.9 F (36.6 C) (05/26 1328) Temp Source: Oral (05/26 1328) BP: 133/49 (05/26 1328) Pulse Rate: 59 (05/26 1328)  Labs: Recent Labs    04/03/18 1945 04/03/18 1957 04/04/18 2246 04/05/18 0546 04/05/18 1503  HGB 12.1* 12.2*  --  11.9*  --   HCT 36.3* 36.0*  --  35.8*  --   PLT 185  --   --  166  --   APTT 42*  --   --   --   --   LABPROT 16.0*  --   --   --   --   INR 1.29  --   --   --   --   HEPARINUNFRC  --   --  0.43 0.86* 0.33  CREATININE 1.40* 1.40*  --  1.31*  --     Estimated Creatinine Clearance: 63.5 mL/min (A) (by C-G formula based on SCr of 1.31 mg/dL (H)).  Assessment: 77 y.o. male with Afib, Pradaxa on hold, for heparin. Heparin level elevated this morning. Per RN, no one spoke to her before drawing labs; normally they notify and have the RN stop the IV in the opposite arm that heparin is running in order to check a level. Unclear if drawn from heparin line, but will dose reduce and recheck a level.  Heparin level therapeutic, 0.33; no signs/sx bleeding noted by RN   Goal of Therapy:  Heparin level 0.3-0.7 Monitor platelets by anticoagulation protocol: Yes   Plan:  Continue heparin gtt at 1500 units/hr 8 hour confirmatory heparin level Daily heparin level, CBC Monitor clinical course, s/sx bleeding  Donnivan Villena A. Levada Dy, PharmD, [place] Pager: [phone]  04/05/2018,5:31 PM

## 2018-04-05 NOTE — Progress Notes (Signed)
PROGRESS NOTE        PATIENT DETAILS Name: Steven Maldonado Age: 77 y.o. Sex: male Date of Birth: 1941/03/15 Admit Date: 04/03/2018 Admitting Physician Norval Morton, MD [id number], Lonie Peak, [name]  Brief Narrative: Patient is a 77 y.o. male history of PAD (prior history of PCI to right popliteal artery) atrial fibrillation on anticoagulation, severe aortic stenosis status post TAVR, history of complete AV block requiring pacemaker placement presenting with left third toe necrotic changes after a can and drop on his foot approximately 3 weeks back.  He was subsequently admitted to the hospital service for further IV antibiotics and vascular surgery evaluation.   Subjective:  Patient in bed, appears comfortable, denies any headache, no fever, no chest pain or pressure, no shortness of breath , no abdominal pain. No focal weakness.  Assessment/Plan:  Necrotic changes to left third toe: For now on vancomycin along with Rocephin and Flagyl combination, vascular surgery on board and contemplating arteriogram followed by possible left third toe amputation versus any other intervention needed.  Minimal surrounding cellulitis.  Renew heparin drip for now.  Insulin-dependent DM-2: CBG stable, continue Lantus 65 units daily, and SSI.   CBG (last 3)  Recent Labs    04/04/18 1634 04/04/18 2100 04/05/18 0649  GLUCAP 221* 156* 187*    Dyslipidemia: Continue statin  CAD: No anginal symptoms-continue aspirin, statin metoprolol  PAF: Rate controlled with amiodarone, metoprolol-continue to hold Pradaxa in case patient requires vascular intervention-we will start heparin per pharmacy while inpatient.  History of severe aortic stenosis status post TAVR, stable currently.  Follows with Dr. Angelena Form.  CKD stage III: Creatinine close to usual baseline-follow.  Hypothyroidism: Continue levothyroxine and TSH elevated at 20-but a few months ago was 53.  Continue current dosing  of levothyroxine and follow-up with PCP for further continued care.  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-continue Neurontin, no acute issues.  OSA: Continue CPAP nightly, compliant.   DVT Prophylaxis: Full dose anticoagulation with Heparin  Code Status: Full code   Family Communication: None at bedside  Disposition Plan: Remain inpatient  Antimicrobial agents: Anti-infectives (From admission, onward)   Start     Dose/Rate Route Frequency Ordered Stop   04/04/18 2200  vancomycin (VANCOCIN) 1,250 mg in sodium chloride 0.9 % 250 mL IVPB     1,250 mg 166.7 mL/hr over 90 Minutes Intravenous Every 24 hours 04/03/18 2158     04/04/18 0300  metroNIDAZOLE (FLAGYL) IVPB 500 mg     500 mg 100 mL/hr over 60 Minutes Intravenous Every 8 hours 04/04/18 0204     04/03/18 2200  cefTRIAXone (ROCEPHIN) 2 g in sodium chloride 0.9 % 100 mL IVPB     2 g 200 mL/hr over 30 Minutes Intravenous Every 24 hours 04/03/18 2149     04/03/18 2200  metroNIDAZOLE (FLAGYL) IVPB 500 mg  Status:  Discontinued     500 mg 100 mL/hr over 60 Minutes Intravenous Every 8 hours 04/03/18 2149 04/04/18 0204   04/03/18 2200  vancomycin (VANCOCIN) 2,000 mg in sodium chloride 0.9 % 500 mL IVPB     2,000 mg 250 mL/hr over 120 Minutes Intravenous  Once 04/03/18 2158 04/04/18 0142      Procedures: None  CONSULTS:  vascular surgery  Time spent: 25- minutes-Greater than 50% of this time was spent in counseling, explanation of diagnosis, planning of  further management, and coordination of care.  MEDICATIONS: Scheduled Meds: . amiodarone  200 mg Oral Daily  . aspirin EC  81 mg Oral Daily  . atorvastatin  80 mg Oral QHS  . fenofibrate  160 mg Oral Daily  . gabapentin  300 mg Oral TID  . insulin aspart  0-15 Units Subcutaneous TID WC  . insulin glargine  65 Units Subcutaneous QHS  . levothyroxine  150 mcg Oral QAC breakfast  . metoprolol tartrate  75 mg Oral BID   Continuous Infusions: . cefTRIAXone  (ROCEPHIN)  IV Stopped (04/05/18 0543)  . heparin 1,500 Units/hr (04/05/18 0855)  . metronidazole 500 mg (04/05/18 0855)  . vancomycin Stopped (04/04/18 2256)   PRN Meds:.acetaminophen **OR** acetaminophen, albuterol, docusate sodium, hydrALAZINE, HYDROcodone-acetaminophen, ondansetron **OR** ondansetron (ZOFRAN) IV   PHYSICAL EXAM: Vital signs: Vitals:   04/04/18 0557 04/04/18 1235 04/04/18 2101 04/05/18 0500  BP: 111/64 (!) 147/62 (!) 171/64 (!) 137/59  Pulse: 60 60 (!) 59 (!) 59  Resp:  18 18 18   Temp:  (!) 97.5 F (36.4 C) 98.7 F (37.1 C) 98.7 F (37.1 C)  TempSrc:  Oral Oral Axillary  SpO2: 98% 99% 99% 99%  Weight:      Height:       Filed Weights   04/03/18 1802  Weight: 113.9 kg (251 lb)   Body mass index is 35.01 kg/m.   Exam  Awake Alert, Oriented X 3, No new F.N deficits, Normal affect [place].AT,PERRAL Supple Neck,No JVD, No cervical lymphadenopathy appriciated.  Symmetrical Chest wall movement, Good air movement bilaterally, CTAB RRR,No Gallops, Rubs or new Murmurs, No Parasternal Heave +ve B.Sounds, Abd Soft, No tenderness, No organomegaly appriciated, No rebound - guarding or rigidity. No Cyanosis, Clubbing or edema, No new Rash or bruise  Left 3rd toe below         I have personally reviewed following labs and imaging studies  LABORATORY DATA: CBC: Recent Labs  Lab 04/03/18 1945 04/03/18 1957 04/05/18 0546  WBC 7.9  --  5.5  HGB 12.1* 12.2* 11.9*  HCT 36.3* 36.0* 35.8*  MCV 91.9  --  91.1  PLT 185  --  825    Basic Metabolic Panel: Recent Labs  Lab 04/03/18 1945 04/03/18 1957 04/05/18 0546  NA 137 138 138  K 4.5 4.4 3.7  CL 102 102 104  CO2 26  --  24  GLUCOSE 253* 248* 178*  BUN 23* 23* 18  CREATININE 1.40* 1.40* 1.31*  CALCIUM 9.2  --  8.9    GFR: Estimated Creatinine Clearance: 63.5 mL/min (A) (by C-G formula based on SCr of 1.31 mg/dL (H)).  Liver Function Tests: Recent Labs  Lab 04/03/18 1945  AST 18  ALT 19    ALKPHOS 47  BILITOT 0.2*  PROT 7.1  ALBUMIN 3.4*   No results for input(s): LIPASE, AMYLASE in the last 168 hours. No results for input(s): AMMONIA in the last 168 hours.  Coagulation Profile: Recent Labs  Lab 04/03/18 1945  INR 1.29    Cardiac Enzymes: No results for input(s): CKTOTAL, CKMB, CKMBINDEX, TROPONINI in the last 168 hours.  BNP (last 3 results) Recent Labs    12/11/17 1522  PROBNP 271    HbA1C: No results for input(s): HGBA1C in the last 72 hours.  CBG: Recent Labs  Lab 04/04/18 0701 04/04/18 1236 04/04/18 1634 04/04/18 2100 04/05/18 0649  GLUCAP 107* 135* 221* 156* 187*    Lipid Profile: No results for input(s): CHOL, HDL, LDLCALC, TRIG,  CHOLHDL, LDLDIRECT in the last 72 hours.  Thyroid Function Tests: No results for input(s): TSH, T4TOTAL, FREET4, T3FREE, THYROIDAB in the last 72 hours.  Anemia Panel: No results for input(s): VITAMINB12, FOLATE, FERRITIN, TIBC, IRON, RETICCTPCT in the last 72 hours.  Urine analysis:    Component Value Date/Time   COLORURINE YELLOW 11/28/2017 1128   APPEARANCEUR HAZY (A) 11/28/2017 1128   LABSPEC 1.022 11/28/2017 1128   PHURINE 5.0 11/28/2017 1128   GLUCOSEU >=500 (A) 11/28/2017 1128   HGBUR NEGATIVE 11/28/2017 1128   BILIRUBINUR NEGATIVE 11/28/2017 1128   KETONESUR NEGATIVE 11/28/2017 1128   PROTEINUR NEGATIVE 11/28/2017 1128   UROBILINOGEN 0.2 01/02/2015 1559   NITRITE NEGATIVE 11/28/2017 1128   LEUKOCYTESUR LARGE (A) 11/28/2017 1128    Sepsis Labs: Lactic Acid, Venous No results found for: LATICACIDVEN  MICROBIOLOGY: Recent Results (from the past 240 hour(s))  Blood Cultures x 2 sites     Status: None (Preliminary result)   Collection Time: 04/03/18  9:03 PM  Result Value Ref Range Status   Specimen Description   Final    BLOOD LEFT FOREARM Blood Culture adequate volume BOTTLES DRAWN AEROBIC AND ANAEROBIC Performed at [facility] [address] [place], [place]  [address]    Special Requests   Final    NONE Performed at [facility], [place] [address] [place], [place] [address]    Culture   Final    NO GROWTH < 24 HOURS Performed at [facility] [address], [place], [place] [address]    Report Status PENDING  Incomplete    RADIOLOGY STUDIES/RESULTS: Dg Foot Complete Left  Result Date: 04/03/2018 CLINICAL DATA:  77 y/o M; crush injury 3 weeks ago with pain and swelling. Blackness of the third toe. EXAM: LEFT FOOT - COMPLETE 3+ VIEW COMPARISON:  None. FINDINGS: There is no evidence of fracture or dislocation. There is no evidence of arthropathy or other focal bone abnormality. Vascular calcifications. IMPRESSION: No acute fracture or dislocation identified. Electronically Signed   By: Kristine Garbe M.D.   On: 04/03/2018 18:22     LOS: 2 days   Lala Lund, MD  [facility]  If 7PM-7AM, please contact night-coverage  Please page via [email or web address] [email or web address]-click on MD name and type text message  04/05/2018, 11:22 AM

## 2018-04-05 NOTE — Consult Note (Signed)
[facility] Nurse wound consult note Reason for Consult: WOC Nursing was simultaneously consulted with [facility].  Dr. Glennon Hamilton saw patient last pm. Please see his note for the POC and photo of affected digit.. Wound type: Traumatic; left foot, 3rd digit is gangrenous, necrotic Pressure Injury POA: N/A Drainage (amount, consistency, odor) None Dressing procedure/placement/frequency: I have added once daily cleansing of the affected digits with thorough drying, particularly between toes followed by placement of a dry gauze dressing to protect the foot from injury. Plan is for a vascular intervention later this week due to holiday schedule (per Dr. Lianne Moris note). Vascular surgeon's orders supercede those of the [facility] nurse and we defer to their expertise.  [facility] nursing team will not follow, but will remain available to this patient, the nursing and medical teams.  Please re-consult if needed. Thanks, Maudie Flakes, MSN, RN, [place], Arther Abbott  Pager# [phone]

## 2018-04-05 NOTE — Progress Notes (Signed)
ANTICOAGULATION CONSULT NOTE   Pharmacy Consult for heparin Indication: atrial fibrillation  No Known Allergies  Patient Measurements: Height: 5\' 11"  (180.3 cm) Weight: 251 lb (113.9 kg) IBW/kg (Calculated) : 75.3 Heparin Dosing Weight: 100kg  Vital Signs: Temp: 98.7 F (37.1 C) (05/25 2101) Temp Source: Oral (05/25 2101) BP: 171/64 (05/25 2101) Pulse Rate: 59 (05/25 2101)  Labs: Recent Labs    04/03/18 1945 04/03/18 1957 04/04/18 2246  HGB 12.1* 12.2*  --   HCT 36.3* 36.0*  --   PLT 185  --   --   APTT 42*  --   --   LABPROT 16.0*  --   --   INR 1.29  --   --   HEPARINUNFRC  --   --  0.43  CREATININE 1.40* 1.40*  --     Estimated Creatinine Clearance: 59.4 mL/min (A) (by C-G formula based on SCr of 1.4 mg/dL (H)).  Assessment: 77 y.o. male with Afib, Pradaxa on hold, for heparin   Goal of Therapy:  Heparin level 0.3-0.7 Monitor platelets by anticoagulation protocol: Yes   Plan:  Continue Heparin at current rate   Phillis Knack, PharmD, BCPS  04/05/2018,1:11 AM

## 2018-04-05 NOTE — Progress Notes (Signed)
VASCULAR LAB PRELIMINARY  ARTERIAL  ABI completed:    RIGHT    LEFT    PRESSURE WAVEFORM  PRESSURE WAVEFORM  BRACHIAL 176 triphasic BRACHIAL 179 biphasic  DP 246  monophasic DP 88 monophasic  AT   AT    PT 65 Dampend monophasic PT 55 Irregular monophasic  PER   PER    GREAT TOE 36 Nearly absent GREAT TOE 0 absent    RIGHT LEFT  ABI 1.37 0.49     HONGYING  Brently Voorhis, RVT 04/05/2018, 4:04 PM

## 2018-04-06 LAB — CBC
HCT: 36.6 % — ABNORMAL LOW (ref 39.0–52.0)
Hemoglobin: 12.1 g/dL — ABNORMAL LOW (ref 13.0–17.0)
MCH: 30 pg (ref 26.0–34.0)
MCHC: 33.1 g/dL (ref 30.0–36.0)
MCV: 90.8 fL (ref 78.0–100.0)
Platelets: 177 10*3/uL (ref 150–400)
RBC: 4.03 MIL/uL — ABNORMAL LOW (ref 4.22–5.81)
RDW: 12.9 % (ref 11.5–15.5)
WBC: 5.9 10*3/uL (ref 4.0–10.5)

## 2018-04-06 LAB — BASIC METABOLIC PANEL
Anion gap: 10 (ref 5–15)
BUN: 17 mg/dL (ref 6–20)
CO2: 26 mmol/L (ref 22–32)
Calcium: 9.4 mg/dL (ref 8.9–10.3)
Chloride: 103 mmol/L (ref 101–111)
Creatinine, Ser: 1.28 mg/dL — ABNORMAL HIGH (ref 0.61–1.24)
GFR calc Af Amer: >60 mL/min (ref >60)
GFR calc non Af Amer: 53 mL/min — ABNORMAL LOW (ref >60)
Glucose, Bld: 221 mg/dL — ABNORMAL HIGH (ref 65–99)
Potassium: 4.2 mmol/L (ref 3.5–5.1)
Sodium: 139 mmol/L (ref 135–145)

## 2018-04-06 LAB — HEPARIN LEVEL (UNFRACTIONATED)
Heparin Unfractionated: 0.21 IU/mL — ABNORMAL LOW (ref 0.30–0.70)
Heparin Unfractionated: 0.83 IU/mL — ABNORMAL HIGH (ref 0.30–0.70)

## 2018-04-06 LAB — GLUCOSE, CAPILLARY
Glucose-Capillary: 171 mg/dL — ABNORMAL HIGH (ref 65–99)
Glucose-Capillary: 198 mg/dL — ABNORMAL HIGH (ref 65–99)
Glucose-Capillary: 293 mg/dL — ABNORMAL HIGH (ref 65–99)
Glucose-Capillary: 241 mg/dL — ABNORMAL HIGH (ref 65–99)

## 2018-04-06 LAB — PROTIME-INR
INR: 1.06
Prothrombin Time: 13.7 seconds (ref 11.4–15.2)

## 2018-04-06 MED ORDER — DOXYCYCLINE HYCLATE 100 MG PO TABS
100.0000 mg | ORAL_TABLET | Freq: Two times a day (BID) | ORAL | Status: DC
  Administered 2018-04-06 – 2018-04-07 (×3): 100 mg via ORAL
  Filled 2018-04-06 (×3): qty 1

## 2018-04-06 MED ORDER — DABIGATRAN ETEXILATE MESYLATE 150 MG PO CAPS
150.0000 mg | ORAL_CAPSULE | Freq: Two times a day (BID) | ORAL | Status: DC
  Administered 2018-04-06 – 2018-04-07 (×2): 150 mg via ORAL
  Filled 2018-04-06 (×3): qty 1

## 2018-04-06 NOTE — Progress Notes (Signed)
PROGRESS NOTE        PATIENT DETAILS Name: Steven Maldonado Age: 77 y.o. Sex: male Date of Birth: 02-Aug-1941 Admit Date: 04/03/2018 Admitting Physician Norval Morton, MD [id number], Lonie Peak, [name]  Brief Narrative:  Patient is a 77 y.o. male history of PAD (prior history of PCI to right popliteal artery) atrial fibrillation on anticoagulation, severe aortic stenosis status post TAVR, history of complete AV block requiring pacemaker placement presenting with left third toe necrotic changes after a can and drop on his foot approximately 3 weeks back.  He was subsequently admitted to the hospital service for further IV antibiotics and vascular surgery evaluation.   Subjective: Patient in bed, appears comfortable, denies any headache, no fever, no chest pain or pressure, no shortness of breath , no abdominal pain. No focal weakness.  Assessment/Plan:  Necrotic changes to left third toe: He has been seen by vascular surgery, initially placed on empiric vancomycin, Rocephin along with Flagyl, minimal to no surrounding cellulitis at this time, he is symptom-free, nontoxic, he was also kept on heparin drip.  Case discussed with Dr. Bridgett Larsson vascular surgeon on 04/06/2018.  He wants to see the patient in the outpatient setting for elective arteriogram, ABIs do suggest left-sided PAD.  At this time he will be replaced on Pradaxa, will stop all IV heparin along with IV antibiotics and place him on oral doxycycline.  If tolerates will discharge tomorrow with outpatient vascular follow-up.  Patient understands that his left third toe is not salvageable at this time.  Dyslipidemia: Continue statin  CAD: No anginal symptoms-continue aspirin, statin metoprolol  PAF: Rate controlled with amiodarone, metoprolol-continue Pradaxa.  History of severe aortic stenosis status post TAVR, stable currently.  Follows with Dr. Angelena Form.  CKD stage III: Creatinine close to usual  baseline-follow.  Hypothyroidism: Continue levothyroxine and TSH elevated at 20-but a few months ago was 53.  Continue current dosing of levothyroxine and follow-up with PCP for further continued care.  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-continue Neurontin, no acute issues.  OSA: Continue CPAP nightly, compliant.  Insulin-dependent DM-2: CBG stable, continue Lantus 65 units daily, and SSI.   CBG (last 3)  Recent Labs    04/05/18 2125 04/06/18 0640 04/06/18 1133  GLUCAP 200* 171* 198*     DVT Prophylaxis: Full dose anticoagulation with Heparin  Code Status: Full code   Family Communication: None at bedside  Disposition Plan: Remain inpatient  Antimicrobial agents: Anti-infectives (From admission, onward)   Start     Dose/Rate Route Frequency Ordered Stop   04/04/18 2200  vancomycin (VANCOCIN) 1,250 mg in sodium chloride 0.9 % 250 mL IVPB     1,250 mg 166.7 mL/hr over 90 Minutes Intravenous Every 24 hours 04/03/18 2158     04/04/18 0300  metroNIDAZOLE (FLAGYL) IVPB 500 mg     500 mg 100 mL/hr over 60 Minutes Intravenous Every 8 hours 04/04/18 0204     04/03/18 2200  cefTRIAXone (ROCEPHIN) 2 g in sodium chloride 0.9 % 100 mL IVPB     2 g 200 mL/hr over 30 Minutes Intravenous Every 24 hours 04/03/18 2149     04/03/18 2200  metroNIDAZOLE (FLAGYL) IVPB 500 mg  Status:  Discontinued     500 mg 100 mL/hr over 60 Minutes Intravenous Every 8 hours 04/03/18 2149 04/04/18 0204   04/03/18 2200  vancomycin (VANCOCIN)  2,000 mg in sodium chloride 0.9 % 500 mL IVPB     2,000 mg 250 mL/hr over 120 Minutes Intravenous  Once 04/03/18 2158 04/04/18 0142      Procedures: None  CONSULTS:  vascular surgery  Time spent: 25- minutes-Greater than 50% of this time was spent in counseling, explanation of diagnosis, planning of further management, and coordination of care.  MEDICATIONS: Scheduled Meds: . amiodarone  200 mg Oral Daily  . aspirin EC  81 mg Oral Daily  .  atorvastatin  80 mg Oral QHS  . fenofibrate  160 mg Oral Daily  . gabapentin  300 mg Oral TID  . insulin aspart  0-15 Units Subcutaneous TID WC  . insulin glargine  65 Units Subcutaneous QHS  . levothyroxine  150 mcg Oral QAC breakfast  . metoprolol tartrate  75 mg Oral BID   Continuous Infusions: . cefTRIAXone (ROCEPHIN)  IV Stopped (04/06/18 0140)  . heparin 1,700 Units/hr (04/06/18 0703)  . metronidazole Stopped (04/06/18 1053)  . vancomycin Stopped (04/06/18 0618)   PRN Meds:.acetaminophen **OR** acetaminophen, albuterol, docusate sodium, hydrALAZINE, HYDROcodone-acetaminophen, ondansetron **OR** ondansetron (ZOFRAN) IV   PHYSICAL EXAM: Vital signs: Vitals:   04/04/18 2101 04/05/18 0500 04/05/18 1328 04/05/18 2118  BP: (!) 171/64 (!) 137/59 (!) 133/49 (!) 144/67  Pulse: (!) 59 (!) 59 (!) 59 69  Resp: 18 18 20    Temp: 98.7 F (37.1 C) 98.7 F (37.1 C) 97.9 F (36.6 C) (!) 97.5 F (36.4 C)  TempSrc: Oral Axillary Oral Oral  SpO2: 99% 99% 100% 99%  Weight:      Height:       Filed Weights   04/03/18 1802  Weight: 113.9 kg (251 lb)   Body mass index is 35.01 kg/m.   Exam  Awake Alert, Oriented X 3, No new F.N deficits, Normal affect [place].AT,PERRAL Supple Neck,No JVD, No cervical lymphadenopathy appriciated.  Symmetrical Chest wall movement, Good air movement bilaterally, CTAB RRR,No Gallops, Rubs , No Parasternal Heave +ve B.Sounds, Abd Soft, No tenderness, No organomegaly appriciated, No rebound - guarding or rigidity. No Cyanosis, Clubbing or edema, No new Rash or bruise  Positive aortic systolic murmur, left 3rd toe below         I have personally reviewed following labs and imaging studies  LABORATORY DATA: CBC: Recent Labs  Lab 04/03/18 1945 04/03/18 1957 04/05/18 0546 04/06/18 0350  WBC 7.9  --  5.5 5.9  HGB 12.1* 12.2* 11.9* 12.1*  HCT 36.3* 36.0* 35.8* 36.6*  MCV 91.9  --  91.1 90.8  PLT 185  --  166 097    Basic Metabolic  Panel: Recent Labs  Lab 04/03/18 1945 04/03/18 1957 04/05/18 0546 04/06/18 0818  NA 137 138 138 139  K 4.5 4.4 3.7 4.2  CL 102 102 104 103  CO2 26  --  24 26  GLUCOSE 253* 248* 178* 221*  BUN 23* 23* 18 17  CREATININE 1.40* 1.40* 1.31* 1.28*  CALCIUM 9.2  --  8.9 9.4    GFR: Estimated Creatinine Clearance: 65 mL/min (A) (by C-G formula based on SCr of 1.28 mg/dL (H)).  Liver Function Tests: Recent Labs  Lab 04/03/18 1945  AST 18  ALT 19  ALKPHOS 47  BILITOT 0.2*  PROT 7.1  ALBUMIN 3.4*   No results for input(s): LIPASE, AMYLASE in the last 168 hours. No results for input(s): AMMONIA in the last 168 hours.  Coagulation Profile: Recent Labs  Lab 04/03/18 1945 04/06/18 0818  INR 1.29  1.06    Cardiac Enzymes: No results for input(s): CKTOTAL, CKMB, CKMBINDEX, TROPONINI in the last 168 hours.  BNP (last 3 results) Recent Labs    12/11/17 1522  PROBNP 271    HbA1C: No results for input(s): HGBA1C in the last 72 hours.  CBG: Recent Labs  Lab 04/05/18 1224 04/05/18 1704 04/05/18 2125 04/06/18 0640 04/06/18 1133  GLUCAP 225* 209* 200* 171* 198*    Lipid Profile: No results for input(s): CHOL, HDL, LDLCALC, TRIG, CHOLHDL, LDLDIRECT in the last 72 hours.  Thyroid Function Tests: No results for input(s): TSH, T4TOTAL, FREET4, T3FREE, THYROIDAB in the last 72 hours.  Anemia Panel: No results for input(s): VITAMINB12, FOLATE, FERRITIN, TIBC, IRON, RETICCTPCT in the last 72 hours.  Urine analysis:    Component Value Date/Time   COLORURINE YELLOW 11/28/2017 1128   APPEARANCEUR HAZY (A) 11/28/2017 1128   LABSPEC 1.022 11/28/2017 1128   PHURINE 5.0 11/28/2017 1128   GLUCOSEU >=500 (A) 11/28/2017 1128   HGBUR NEGATIVE 11/28/2017 1128   BILIRUBINUR NEGATIVE 11/28/2017 1128   KETONESUR NEGATIVE 11/28/2017 1128   PROTEINUR NEGATIVE 11/28/2017 1128   UROBILINOGEN 0.2 01/02/2015 1559   NITRITE NEGATIVE 11/28/2017 1128   LEUKOCYTESUR LARGE (A)  11/28/2017 1128    Sepsis Labs: Lactic Acid, Venous No results found for: LATICACIDVEN  MICROBIOLOGY: Recent Results (from the past 240 hour(s))  Blood Cultures x 2 sites     Status: None (Preliminary result)   Collection Time: 04/03/18  9:03 PM  Result Value Ref Range Status   Specimen Description   Final    BLOOD LEFT FOREARM Blood Culture adequate volume BOTTLES DRAWN AEROBIC AND ANAEROBIC Performed at [facility] [address], [place], [place] [address]    Special Requests   Final    NONE Performed at [facility], [place] [address] [place], [place] [address]    Culture   Final    NO GROWTH < 24 HOURS Performed at [facility] [address], [place], [place] [address]    Report Status PENDING  Incomplete    RADIOLOGY STUDIES/RESULTS: Dg Foot Complete Left  Result Date: 04/03/2018 CLINICAL DATA:  77 y/o M; crush injury 3 weeks ago with pain and swelling. Blackness of the third toe. EXAM: LEFT FOOT - COMPLETE 3+ VIEW COMPARISON:  None. FINDINGS: There is no evidence of fracture or dislocation. There is no evidence of arthropathy or other focal bone abnormality. Vascular calcifications. IMPRESSION: No acute fracture or dislocation identified. Electronically Signed   By: Kristine Garbe M.D.   On: 04/03/2018 18:22     LOS: 3 days   Lala Lund, MD  [facility]  If 7PM-7AM, please contact night-coverage  Please page via [email or web address] [email or web address]-click on MD name and type text message  04/06/2018, 12:52 PM

## 2018-04-06 NOTE — Progress Notes (Signed)
ANTICOAGULATION CONSULT NOTE   Pharmacy Consult for heparin Indication: atrial fibrillation  No Known Allergies  Patient Measurements: Height: 5\' 11"  (180.3 cm) Weight: 251 lb (113.9 kg) IBW/kg (Calculated) : 75.3 Heparin Dosing Weight: 100kg  Vital Signs: Temp: 97.5 F (36.4 C) (05/26 2118) Temp Source: Oral (05/26 2118) BP: 144/67 (05/26 2118) Pulse Rate: 69 (05/26 2118)  Labs: Recent Labs    04/03/18 1945 04/03/18 1957  04/05/18 0546 04/05/18 1503 04/05/18 2301 04/06/18 0350  HGB 12.1* 12.2*  --  11.9*  --   --  12.1*  HCT 36.3* 36.0*  --  35.8*  --   --  36.6*  PLT 185  --   --  166  --   --  177  APTT 42*  --   --   --   --   --   --   LABPROT 16.0*  --   --   --   --   --   --   INR 1.29  --   --   --   --   --   --   HEPARINUNFRC  --   --    < > 0.86* 0.33 0.86* 0.21*  CREATININE 1.40* 1.40*  --  1.31*  --   --   --    < > = values in this interval not displayed.    Estimated Creatinine Clearance: 63.5 mL/min (A) (by C-G formula based on SCr of 1.31 mg/dL (H)).  Assessment: 77 y.o. male with Afib, Pradaxa on hold, for heparin   Goal of Therapy:  Heparin level 0.3-0.7 Monitor platelets by anticoagulation protocol: Yes   Plan:  .Increase Heparin  1700 units/hr Check heparin level in 6 hours.   Phillis Knack, PharmD, BCPS  04/06/2018,5:30 AM

## 2018-04-06 NOTE — Progress Notes (Signed)
ANTICOAGULATION CONSULT NOTE - Initial Consult  Pharmacy Consult for Pradaxa Indication: atrial fibrillation  No Known Allergies  Patient Measurements: Height: 5\' 11"  (180.3 cm) Weight: 251 lb (113.9 kg) IBW/kg (Calculated) : 75.3  Vital Signs:    Labs: Recent Labs    04/03/18 1945 04/03/18 1957  04/05/18 0546  04/05/18 2301 04/06/18 0350 04/06/18 0818 04/06/18 1228  HGB 12.1* 12.2*  --  11.9*  --   --  12.1*  --   --   HCT 36.3* 36.0*  --  35.8*  --   --  36.6*  --   --   PLT 185  --   --  166  --   --  177  --   --   APTT 42*  --   --   --   --   --   --   --   --   LABPROT 16.0*  --   --   --   --   --   --  13.7  --   INR 1.29  --   --   --   --   --   --  1.06  --   HEPARINUNFRC  --   --    < > 0.86*   < > 0.86* 0.21*  --  0.83*  CREATININE 1.40* 1.40*  --  1.31*  --   --   --  1.28*  --    < > = values in this interval not displayed.    Estimated Creatinine Clearance: 65 mL/min (A) (by C-G formula based on SCr of 1.28 mg/dL (H)).   Medical History: Past Medical History:  Diagnosis Date  . Chronic diastolic CHF (congestive heart failure) ([place])   . CKD (chronic kidney disease), stage III ([place])   . Coronary artery disease    a. Cath February 2012 in [facility], occluded RCA with collaterals  . DM type 2 (diabetes mellitus, type 2) ([place])   . Essential hypertension   . Hyperlipidemia   . Pacemaker    a. symptomatic brady after TAVR s/p MDT PPM by Dr. Curt Bears 12/04/17  . Persistent atrial fibrillation ([place])   . PVD (peripheral vascular disease) ([place])    a. s/p R popliteal artery stenosis tx with drug-coated balloon 05/2014, followed by Dr. Fletcher Anon.  . 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 Severe aortic stenosis    a. 12/02/17: s/p TAVR  . Skin cancer   . Sleep apnea with use of continuous positive airway pressure (CPAP)    04-11-11 AHI  was 32.9 and titrated to 15 cm H20, DME is [facility]  . Subclinical hypothyroidism     Assessment:  Anticoag: Convert from pradaxa to heparin drip> Pradaxa 5/27 (no imminent procedure). Hgb 12.1 and plts 177 stable.  Goal of Therapy:  Therapeutic oral anticoagulation   Plan:  D/c IV heparin Pradaxa 150mg  BID D/c Vancomycin  Korin Setzler S. Alford [place]land, PharmD, BCPS Clinical Staff Pharmacist Pager [phone]  Eilene Ghazi Stillinger 04/06/2018,1:35 PM

## 2018-04-06 NOTE — Progress Notes (Addendum)
  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 Abdomen:  Soft Neurologic: A&O  CBC    Component Value Date/Time   WBC 5.9 04/06/2018 0350   RBC 4.03 (L) 04/06/2018 0350   HGB 12.1 (L) 04/06/2018 0350   HGB 12.9 (L) 09/26/2017 1535   HCT 36.6 (L) 04/06/2018 0350   HCT 37.9 09/26/2017 1535   PLT 177 04/06/2018 0350   PLT 169 09/26/2017 1535   MCV 90.8 04/06/2018 0350   MCV 92 09/26/2017 1535   MCH 30.0 04/06/2018 0350   MCHC 33.1 04/06/2018 0350   RDW 12.9 04/06/2018 0350   RDW 13.7 09/26/2017 1535   LYMPHSABS 1.8 09/26/2017 1535   MONOABS 0.7 11/02/2015 0949   EOSABS 0.1 09/26/2017 1535   BASOSABS 0.1 09/26/2017 1535    BMET    Component Value Date/Time   NA 139 04/06/2018 0818   NA 139 12/24/2017 1448   K 4.2 04/06/2018 0818   CL 103 04/06/2018 0818   CO2 26 04/06/2018 0818   GLUCOSE 221 (H) 04/06/2018 0818   BUN 17 04/06/2018 0818   BUN 20 12/24/2017 1448   CREATININE 1.28 (H) 04/06/2018 0818   CREATININE 1.75 (H) 10/13/2017 1224   CALCIUM 9.4 04/06/2018 0818   GFRNONAA 53 (L) 04/06/2018 0818   GFRNONAA 38 (L) 10/13/2017 1224   GFRAA >60 04/06/2018 0818   GFRAA 43 (L) 10/13/2017 1224    INR    Component Value Date/Time   INR 1.06 04/06/2018 0818     Intake/Output Summary (Last 24 hours) at 04/06/2018 1335 Last data filed at 04/06/2018 1234 Gross per 24 hour  Intake 1089.9 ml  Output 1400 ml  Net -310.1 ml     Assessment/Plan:  77 y.o. 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  I would hold Pradaxa to facilitate a Wed/Thursday date for Aortogram, left leg runoff, and possible intervention.    Pt can be discharged from my viewpoint.  Once revascularizaiton is completed, would proceed with L 3rd toe amputation  Adele Barthel, MD, FACS [facility] [place] Office: [phone] Pager: [phone]  04/06/2018, 2:17 PM

## 2018-04-06 NOTE — Evaluation (Signed)
Physical Therapy Evaluation Patient Details Name: Steven Maldonado MRN: [id number] DOB: 05-Mar-1941 Today's Date: 04/06/2018   History of Present Illness  Pt is a 77 y/o male admitted secondary to L 3rd toe discoloration. Imaging of L foot negative for fracture. Per notes and pt, pt will eventually need toe amputation. PMH incldues DM, PAD, CAD, a fib, s/p TAVR, s/p pacemaker, and OSA on CPAP.   Clinical Impression  Pt admitted secondary to problem above with deficits below. Pt presenting with mild unsteadiness and weakness, however, no overt LOB noted. Required supervision for mobility tasks this session without use of AD. Discussed using cane to assist with pain management of L foot. 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 However, pt feels he will not need any follow up PT until then and feels safe to d/c home. Will continue to follow acutely to maximize functional mobility independence and safety.     Follow Up Recommendations No PT follow up;Supervision for mobility/OOB    Equipment Recommendations  None recommended by PT    Recommendations for Other Services       Precautions / Restrictions Precautions Precautions: None Restrictions Weight Bearing Restrictions: No      Mobility  Bed Mobility               General bed mobility comments: In chair upon entry.   Transfers Overall transfer level: Needs assistance Equipment used: None Transfers: Sit to/from Stand Sit to Stand: Supervision         General transfer comment: Supervision for safety.   Ambulation/Gait Ambulation/Gait assistance: Supervision Ambulation Distance (Feet): 100 Feet Assistive device: None Gait Pattern/deviations: Step-through pattern;Decreased stride length;Decreased weight shift to left;Antalgic Gait velocity: Decreased  Gait velocity interpretation: <1.8 ft/sec, indicate of risk for recurrent falls General Gait Details: Slow,  antalgic gait. Mild unsteadiness noted, however, no overt LOB noted. Supervision for safety. Educated about using cane to decrease weightbearing on LLE to assist with pain management.   Stairs            Wheelchair Mobility    Modified Rankin (Stroke Patients Only)       Balance Overall balance assessment: Needs assistance Sitting-balance support: No upper extremity supported;Feet supported Sitting balance-Leahy Scale: Good     Standing balance support: No upper extremity supported;During functional activity Standing balance-Leahy Scale: Fair                               Pertinent Vitals/Pain Pain Assessment: Faces Faces Pain Scale: Hurts little more Pain Location: L foot  Pain Descriptors / Indicators: Burning Pain Intervention(s): Limited activity within patient's tolerance;Monitored during session;Repositioned    Home Living Family/patient expects to be discharged to:: Private residence Living Arrangements: Spouse/significant other Available Help at Discharge: Family;Available 24 hours/day Type of Home: House Home Access: Ramped entrance     Home Layout: One level Home Equipment: Walker - 2 wheels;Cane - single point;Shower seat - built in;Grab bars - tub/shower;Wheelchair - manual      Prior Function Level of Independence: Independent               Hand Dominance        Extremity/Trunk Assessment   Upper Extremity Assessment Upper Extremity Assessment: Defer to OT evaluation    Lower Extremity Assessment Lower Extremity Assessment: LLE deficits/detail LLE Deficits / Details: L foot wrapped during session. Antalgic gait  noted.     Cervical / Trunk Assessment Cervical / Trunk Assessment: Normal  Communication   Communication: No difficulties  Cognition Arousal/Alertness: Awake/alert Behavior During Therapy: WFL for tasks assessed/performed Overall Cognitive Status: Within Functional Limits for tasks assessed                                         General Comments General comments (skin integrity, edema, etc.): Pt reports he feels he is good to go home and does not feel like he needs follow up PT until after surgery.     Exercises     Assessment/Plan    PT Assessment Patient needs continued PT services  PT Problem List Decreased balance;Decreased strength;Decreased mobility;Pain       PT Treatment Interventions DME instruction;Gait training;Functional mobility training;Therapeutic activities;Therapeutic exercise;Balance training;Patient/family education    PT Goals (Current goals can be found in the Care Plan section)  Acute Rehab PT Goals Patient Stated Goal: to go home  PT Goal Formulation: With patient Time For Goal Achievement: 04/20/18 Potential to Achieve Goals: Good    Frequency Min 3X/week   Barriers to discharge        Co-evaluation               AM-PAC PT "6 Clicks" Daily Activity  Outcome Measure Difficulty turning over in bed (including adjusting bedclothes, sheets and blankets)?: A Little Difficulty moving from lying on back to sitting on the side of the bed? : A Little Difficulty sitting down on and standing up from a chair with arms (e.g., wheelchair, bedside commode, etc,.)?: A Little Help needed moving to and from a bed to chair (including a wheelchair)?: A Little Help needed walking in hospital room?: A Little Help needed climbing 3-5 steps with a railing? : A Little 6 Click Score: 18    End of Session   Activity Tolerance: Patient tolerated treatment well Patient left: in chair;with call bell/phone within reach Nurse Communication: Mobility status PT Visit Diagnosis: Other abnormalities of gait and mobility (R26.89);Pain Pain - Right/Left: Left Pain - part of body: Ankle and joints of foot    Time: [id number] PT Time Calculation (min) (ACUTE ONLY): 25 min   Charges:   PT Evaluation $PT Eval Low Complexity: 1 Low PT Treatments $Gait  Training: 8-22 mins   PT G Codes:        Leighton Ruff, PT, DPT  Acute Rehabilitation Services  Pager: [phone]   Rudean Hitt 04/06/2018, 4:58 PM

## 2018-04-07 ENCOUNTER — Telehealth (HOSPITAL_COMMUNITY): Payer: Self-pay | Admitting: *Deleted

## 2018-04-07 ENCOUNTER — Other Ambulatory Visit: Payer: Self-pay | Admitting: *Deleted

## 2018-04-07 LAB — GLUCOSE, CAPILLARY
Glucose-Capillary: 127 mg/dL — ABNORMAL HIGH (ref 65–99)
Glucose-Capillary: 184 mg/dL — ABNORMAL HIGH (ref 65–99)

## 2018-04-07 MED ORDER — AMOXICILLIN-POT CLAVULANATE 500-125 MG PO TABS: 1.0000 | ORAL_TABLET | Freq: Three times a day (TID) | ORAL | 0 refills | Status: DC

## 2018-04-07 MED ORDER — SULFAMETHOXAZOLE-TRIMETHOPRIM 800-160 MG PO TABS: 1.0000 | ORAL_TABLET | Freq: Every day | ORAL | 0 refills | Status: DC

## 2018-04-07 NOTE — Care Management Important Message (Signed)
Important Message  Patient Details  Name: Steven Maldonado MRN: [id number] Date of Birth: 23-Nov-1940   Medicare Important Message Given:  Yes    [name] 04/07/2018, 2:19 PM

## 2018-04-07 NOTE — Progress Notes (Signed)
Patient discharging home today. Discharge instructions explained to patient and spouse and they both verbalized understanding. No further questions or concerns voiced.

## 2018-04-07 NOTE — Progress Notes (Signed)
OT Cancellation Note  Patient Details Name: Steven Maldonado MRN: [id number] DOB: 1941/07/08   Cancelled Treatment:    Reason Eval/Treat Not Completed: OT screened, no needs identified, will sign off; pt declining OT eval reporting no OT needs, stating he has been able to complete daily ADLs without assist, spouse present and in agreement. OT consult is appreciated, will sign off at this time.   Lou Cal, OT Pager [phone] 04/07/2018   [name] 04/07/2018, 10:19 AM

## 2018-04-07 NOTE — Telephone Encounter (Signed)
Returned call to pt wife from message left earlier today.  Reviewed notes in epic.  Pt admitted with necrotic toe.  Pt seen by vascular surgeon who advised antibiotics and amputation.  Message left for pt wife indicating that  we would discharge her husband from the program and have him return for brief orientation when he was cleared to bear weight on the foot and exercise safely.  Contact information provided. Cherre Huger, BSN Cardiac and Training and development officer

## 2018-04-07 NOTE — Discharge Instructions (Addendum)
Follow with Primary MD [name], [name] in 7 days   Get CBC, CMP,   checked  by Primary MD or SNF MD in 5-7 days   Activity: As tolerated with Full fall precautions use walker/cane & assistance as needed  Disposition Home    Diet:   Heart healthy low carbohydrate diet  For Heart failure patients - Check your Weight same time everyday, if you gain over 2 pounds, or you develop in leg swelling, experience more shortness of breath or chest pain, call your Primary MD immediately. Follow Cardiac Low Salt Diet and 1.5 lit/day fluid restriction.  Special Instructions: If you have smoked or chewed Tobacco  in the last 2 yrs please stop smoking, stop any regular Alcohol  and or any Recreational drug use.  On your next visit with your primary care physician please Get Medicines reviewed and adjusted.  Please request your Prim.MD to go over all Hospital Tests and Procedure/Radiological results at the follow up, please get all Hospital records sent to your Prim MD by signing hospital release before you go home.  If you experience worsening of your admission symptoms, develop shortness of breath, life threatening emergency, suicidal or homicidal thoughts you must seek medical attention immediately by calling 911 or calling your MD immediately  if symptoms less severe.  You Must read complete instructions/literature along with all the possible adverse reactions/side effects for all the Medicines you take and that have been prescribed to you. Take any new Medicines after you have completely understood and accpet all the possible adverse reactions/side effects.     Information on my medicine - Pradaxa (dabigatran)  Why was Pradaxa prescribed for you? Pradaxa was prescribed for you to reduce the risk of forming blood clots that cause a stroke if you have a medical condition called atrial fibrillation (a type of irregular heartbeat).    What do you Need to know about PradAXa? Take your Pradaxa  TWICE DAILY - one capsule in the morning and one tablet in the evening with or without food.  It would be best to take the doses about the same time each day.  The capsules should not be broken, chewed or opened - they must be swallowed whole.  Do not store Pradaxa in other medication containers - once the bottle is opened the Pradaxa should be used within FOUR months; throw away any capsules that havent been by that time.  Take Pradaxa exactly as prescribed by your doctor.  DO NOT stop taking Pradaxa without talking to the doctor who prescribed the medication.  Stopping without other stroke prevention medication to take the place of Pradaxa may increase your risk of developing a clot that causes a stroke.  Refill your prescription before you run out.  After discharge, you should have regular check-up appointments with your healthcare provider that is prescribing your Pradaxa.  In the future your dose may need to be changed if your kidney function or weight changes by a significant amount.  What do you do if you miss a dose? If you miss a dose, take it as soon as you remember on the same day.  If your next dose is less than 6 hours away, skip the missed dose.  Do not take two doses of PRADAXA at the same time.  Important Safety Information A possible side effect of Pradaxa is bleeding. You should call your healthcare provider right away if you experience any of the following: ? Bleeding from an injury or your  nose that does not stop. ? Unusual colored urine (red or dark brown) or unusual colored stools (red or black). ? Unusual bruising for unknown reasons. ? A serious fall or if you hit your head (even if there is no bleeding).  Some medicines may interact with Pradaxa and might increase your risk of bleeding or clotting while on Pradaxa. To help avoid this, consult your healthcare provider or pharmacist prior to using any new prescription or non-prescription medications, including  herbals, vitamins, non-steroidal anti-inflammatory drugs (NSAIDs) and supplements.  This website has more information on Pradaxa (dabigatran): [email or web address]://www.pradaxa.com

## 2018-04-07 NOTE — Discharge Summary (Signed)
Steven Maldonado:[id number] DOB: [birth year] DOA: 04/03/2018  PCP: [name], [name]  Admit date: 04/03/2018  Discharge date: 04/07/2018  Admitted From: Home  Disposition:  Home   Recommendations for Outpatient Follow-up:   Follow up with PCP in 1-2 weeks  PCP Please obtain BMP/CBC, 2 view CXR in 1week,  (see Discharge instructions)   PCP Please follow up on the following pending results: None   Home Health: RN,PT,OT   Equipment/Devices: Walker  Consultations: VVS Discharge Condition: Fair   CODE STATUS: Full   Diet Recommendation: Heart healthy, low carbohydrate   Chief Complaint  Patient presents with  . Foot Injury     Brief history of present illness from the day of admission and additional interim summary    Patient is a 77 y.o. male history of PAD (prior history of PCI to right popliteal artery) atrial fibrillation on anticoagulation, severe aortic stenosis status post TAVR, history of complete AV block requiring pacemaker placement presenting with left third toe necrotic changes after a can and drop on his foot approximately 3 weeks back.  He was subsequently admitted to the hospital service for further IV antibiotics and vascular surgery evaluation.                                                                    Hospital Course    Necrotic changes to left third toe: He has been seen by vascular surgery, initially placed on empiric vancomycin, Rocephin along with Flagyl, minimal to no surrounding cellulitis at this time, he is symptom-free, nontoxic, he was also kept on heparin drip.  Case discussed with Dr. Bridgett Larsson vascular surgeon on 04/06/2018.  He wants to see the patient in the outpatient setting for elective arteriogram, ABIs do suggest left-sided PAD.  At this time he will be replaced on  Pradaxa, will stop all IV heparin along with IV antibiotics and place him on oral Bactrim once a day and Augmentin twice daily for a week, he apparently was on doxycycline outpatient diet to this admission.  Per vascular instructions he will be discharged with outpatient vascular follow-up, patient's left third toe is not viable and after arteriogram it could be amputated.  Home RN for wound care also ordered.  At this time minimal to no surrounding cellulitis.  Dyslipidemia: Continue statin  CAD: No anginal symptoms-continue aspirin, statin metoprolol  PAF: Rate controlled with amiodarone, metoprolol-continue Pradaxa.  History of severe aortic stenosis status post TAVR, stable currently.  Follows with Dr. Angelena Form.  CKD stage III: Creatinine close to usual baseline-follow.  Hypothyroidism: Continue levothyroxine and TSH elevated at 20-but a few months ago was 53.  Continue current dosing of levothyroxine and follow-up with PCP for further continued care.  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-continue Neurontin, no acute issues.  OSA: Continue CPAP nightly, compliant.  Insulin-dependent DM-2: CBGs were stable, continue home regimen at discharge and follow with PCP for glycemic control.     Discharge diagnosis     Principal Problem:   Cellulitis in diabetic foot (HCC) Active Problems:   CAD (coronary artery disease)   OSA on CPAP   Chronic kidney disease   Hypothyroidism   Diabetes ([place])   PAD (peripheral artery disease) (HCC)   Essential hypertension   Severe aortic stenosis   S/P TAVR (transcatheter aortic valve replacement)   PAF (paroxysmal atrial fibrillation) [facility])   Pacemaker    Discharge instructions    Discharge Instructions    Discharge instructions   Complete by:  As directed    Follow with Primary MD [name], [name] in 7 days   Get CBC, CMP,   checked  by Primary MD or SNF MD in 5-7 days   Activity: As tolerated with Full fall  precautions use walker/cane & assistance as needed  Disposition Home    Diet:   Heart healthy low carbohydrate diet  For Heart failure patients - Check your Weight same time everyday, if you gain over 2 pounds, or you develop in leg swelling, experience more shortness of breath or chest pain, call your Primary MD immediately. Follow Cardiac Low Salt Diet and 1.5 lit/day fluid restriction.  Special Instructions: If you have smoked or chewed Tobacco  in the last 2 yrs please stop smoking, stop any regular Alcohol  and or any Recreational drug use.  On your next visit with your primary care physician please Get Medicines reviewed and adjusted.  Please request your Prim.MD to go over all Hospital Tests and Procedure/Radiological results at the follow up, please get all Hospital records sent to your Prim MD by signing hospital release before you go home.  If you experience worsening of your admission symptoms, develop shortness of breath, life threatening emergency, suicidal or homicidal thoughts you must seek medical attention immediately by calling 911 or calling your MD immediately  if symptoms less severe.  You Must read complete instructions/literature along with all the possible adverse reactions/side effects for all the Medicines you take and that have been prescribed to you. Take any new Medicines after you have completely understood and accpet all the possible adverse reactions/side effects.   Increase activity slowly   Complete by:  As directed       Discharge Medications   Allergies as of 04/07/2018   No Known Allergies     Medication List    STOP taking these medications   doxycycline 100 MG tablet Commonly known as:  VIBRA-TABS     TAKE these medications   amiodarone 200 MG tablet Commonly known as:  PACERONE Take 1 tablet (200 mg total) by mouth daily.   amoxicillin-clavulanate 500-125 MG tablet Commonly known as:  AUGMENTIN Take 1 tablet (500 mg total) by mouth 3  (three) times daily.   aspirin EC 81 MG tablet Take 81 mg by mouth daily after breakfast.   atorvastatin 80 MG tablet Commonly known as:  LIPITOR Take 80 mg by mouth at bedtime.   BASAGLAR KWIKPEN 100 UNIT/ML Sopn Inject 0.65 mLs (65 Units total) into the skin at bedtime.   CINNAMON PO Take 1 tablet by mouth 2 (two) times daily.   docusate sodium 100 MG capsule Commonly known as:  COLACE Take 100 mg by mouth daily after breakfast.   FREESTYLE LITE Devi 1 each by Does not apply route 2 (  two) times daily.   furosemide 20 MG tablet Commonly known as:  LASIX Take 1 tablet (20 mg total) by mouth daily.   gabapentin 300 MG capsule Commonly known as:  NEURONTIN Take 1 capsule (300 mg total) by mouth 3 (three) times daily.   glipiZIDE 10 MG 24 hr tablet Commonly known as:  GLUCOTROL XL TAKE 1 TABLET (10 MG TOTAL) BY MOUTH DAILY WITH BREAKFAST.   glucose blood test strip Commonly known as:  FREESTYLE LITE 1 each by Other route 4 (four) times daily -  before meals and at bedtime.   insulin lispro 100 UNIT/ML KiwkPen Commonly known as:  HUMALOG Inject 0.05 mLs (5 Units total) into the skin 3 (three) times daily.   HUMALOG KWIKPEN 100 UNIT/ML KiwkPen Generic drug:  insulin lispro INJECT 5 UNITS             SUBCUTANEOUSLY 3 TIMES A   DAY   levothyroxine 150 MCG tablet Commonly known as:  SYNTHROID, LEVOTHROID TAKE 1 TABLET (150 MCG TOTAL) BY MOUTH DAILY. TAKE FOR 3 WEEKS THEN FOLLOW UP WITH PROVIDER   magnesium oxide 400 MG tablet Commonly known as:  MAG-OX Take 400 mg by mouth daily after breakfast.   metoprolol tartrate 50 MG tablet Commonly known as:  LOPRESSOR Take 75 mg by mouth 2 (two) times daily.   MYRBETRIQ 25 MG Tb24 tablet Generic drug:  mirabegron ER Take 25 mg by mouth daily after breakfast.   PRADAXA 150 MG Caps capsule Generic drug:  dabigatran Take 150 mg by mouth every 12 (twelve) hours.   sulfamethoxazole-trimethoprim 800-160 MG tablet Commonly  known as:  BACTRIM DS,SEPTRA DS Take 1 tablet by mouth daily.            Durable Medical Equipment  (From admission, onward)        Start     Ordered   04/07/18 0955  For home use only DME 4 wheeled rolling walker with seat  (Walkers)  Once    Question:  Patient needs a walker to treat with the following condition  Answer:  Weakness   04/07/18 0955      Follow-up Information    [name], [name]. Schedule an appointment as soon as possible for a visit in 1 week(s).   Specialty:  Physician Assistant Contact information: [name] [address] [phone]        Conrad Enterprise, MD. Schedule an appointment as soon as possible for a visit in 1 week(s).   Specialties:  Vascular Surgery, Cardiology Contact information: [address] [place] [address] [phone]           Major procedures and Radiology Reports - PLEASE review detailed and final reports thoroughly  -       Dg Foot Complete Left  Result Date: 04/03/2018 CLINICAL DATA:  77 y/o M; crush injury 3 weeks ago with pain and swelling. Blackness of the third toe. EXAM: LEFT FOOT - COMPLETE 3+ VIEW COMPARISON:  None. FINDINGS: There is no evidence of fracture or dislocation. There is no evidence of arthropathy or other focal bone abnormality. Vascular calcifications. IMPRESSION: No acute fracture or dislocation identified. Electronically Signed   By: Kristine Garbe M.D.   On: 04/03/2018 18:22    Micro Results     Recent Results (from the past 240 hour(s))  Blood Cultures x 2 sites     Status: None (Preliminary result)   Collection Time: 04/03/18  9:03 PM  Result  Value Ref Range Status   Specimen Description   Final    BLOOD LEFT FOREARM Blood Culture adequate volume BOTTLES DRAWN AEROBIC AND ANAEROBIC Performed at [facility] [address] [place], [place] [address]    Special Requests   Final    NONE Performed at [facility],  [place] [address] [place], [place] [address]    Culture   Final    NO GROWTH 2 DAYS Performed at [facility] [address] [place], [place] [address]    Report Status PENDING  Incomplete    Today   Subjective    [employer] today has no headache,no chest abdominal pain,no new weakness tingling or numbness, feels much better wants to go home today.     Objective   Blood pressure (!) 134/49, pulse 67, temperature 98.5 F (36.9 C), temperature source Oral, resp. rate 16, height 5\' 11"  (1.803 m), weight 113.7 kg (250 lb 10.6 oz), SpO2 98 %.   Intake/Output Summary (Last 24 hours) at 04/07/2018 0959 Last data filed at 04/07/2018 0900 Gross per 24 hour  Intake 1329.9 ml  Output 350 ml  Net 979.9 ml    Exam Awake Alert, Oriented x 3, No new F.N deficits, Normal affect [place].AT,PERRAL Supple Neck,No JVD, No cervical lymphadenopathy appriciated.  Symmetrical Chest wall movement, Good air movement bilaterally, CTAB RRR,No Gallops,Rubs , No Parasternal Heave +ve B.Sounds, Abd Soft, Non tender, No organomegaly appriciated, No rebound -guarding or rigidity. No Cyanosis, Clubbing or edema, No new Rash or bruise, left foot under bandage Positive aortic systolic murmur,   left 3rd toe below from the day of admission          Data Review   CBC w Diff:  Lab Results  Component Value Date   WBC 5.9 04/06/2018   HGB 12.1 (L) 04/06/2018   HGB 12.9 (L) 09/26/2017   HCT 36.6 (L) 04/06/2018   HCT 37.9 09/26/2017   PLT 177 04/06/2018   PLT 169 09/26/2017   LYMPHOPCT 34 11/02/2015   MONOPCT 11 11/02/2015   EOSPCT 4 11/02/2015   BASOPCT 1 11/02/2015    CMP:  Lab Results  Component Value Date   NA 139 04/06/2018   NA 139 12/24/2017   K 4.2 04/06/2018   CL 103 04/06/2018   CO2 26 04/06/2018   BUN 17 04/06/2018   BUN 20 12/24/2017   CREATININE 1.28 (H) 04/06/2018   CREATININE 1.75 (H) 10/13/2017   PROT 7.1 04/03/2018   ALBUMIN 3.4 (L) 04/03/2018   BILITOT 0.2  (L) 04/03/2018   ALKPHOS 47 04/03/2018   AST 18 04/03/2018   ALT 19 04/03/2018  .   Total Time in preparing paper work, data evaluation and todays exam - 41 minutes  Lala Lund M.D on 04/07/2018 at 9:59 AM  [facility]   Office  [phone]

## 2018-04-07 NOTE — Plan of Care (Signed)
  Problem: Health Behavior/Discharge Planning: Goal: Ability to manage health-related needs will improve Outcome: Progressing   Problem: Clinical Measurements: Goal: Will remain free from infection Outcome: Progressing   Problem: Activity: Goal: Risk for activity intolerance will decrease Outcome: Progressing   Problem: Pain Managment: Goal: General experience of comfort will improve Outcome: Progressing   

## 2018-04-07 NOTE — Progress Notes (Signed)
Physical Therapy Treatment Patient Details Name: Steven Maldonado MRN: [id number] DOB: Dec 11, 1940 Today's Date: 04/07/2018    History of Present Illness Pt is a 77 y/o male admitted secondary to L 3rd toe discoloration. Imaging of L foot negative for fracture. Per notes and pt, pt will eventually need toe amputation. PMH incldues DM, PAD, CAD, a fib, s/p TAVR, s/p pacemaker, and OSA on CPAP.     PT Comments    Pt performed increased gait training and remains to present with antalgic pattern.  Educated patient on use of cane to off set pain and he remains to decline AD.  Issued and performed standing exercises to improve balance and strength.  Pt fatigues quickly with activity and required assist for correct technique with standing hip abduction.  Educated to perform exercise program 3x daily.  Plan for return home this pm.      Follow Up Recommendations  No PT follow up;Supervision for mobility/OOB     Equipment Recommendations  None recommended by PT    Recommendations for Other Services       Precautions / Restrictions Precautions Precautions: None Restrictions Weight Bearing Restrictions: No    Mobility  Bed Mobility               General bed mobility comments: In chair upon entry.   Transfers Overall transfer level: Needs assistance Equipment used: None Transfers: Sit to/from Stand Sit to Stand: Modified independent (Device/Increase time)         General transfer comment: No assistance.    Ambulation/Gait Ambulation/Gait assistance: Supervision Ambulation Distance (Feet): 250 Feet Assistive device: None Gait Pattern/deviations: Step-through pattern;Decreased stride length;Decreased weight shift to left;Antalgic Gait velocity: Decreased    General Gait Details: Pt remains with antalgic pattern but refused device.  He remains to benefit from cane to off set weight on L side due to pain.  Cues for weight shifting and L toe off.     Stairs              Wheelchair Mobility    Modified Rankin (Stroke Patients Only)       Balance Overall balance assessment: Needs assistance Sitting-balance support: No upper extremity supported;Feet supported Sitting balance-Leahy Scale: Good       Standing balance-Leahy Scale: Fair                              Cognition Arousal/Alertness: Awake/alert Behavior During Therapy: WFL for tasks assessed/performed Overall Cognitive Status: Within Functional Limits for tasks assessed                                        Exercises General Exercises - Lower Extremity Hip ABduction/ADduction: AROM;Both;10 reps;Standing Hip Flexion/Marching: AROM;Both;10 reps;Standing Heel Raises: AROM;Both;10 reps;Standing Mini-Sqauts: AROM;Both;10 reps;Standing    General Comments        Pertinent Vitals/Pain Pain Assessment: Faces Pain Score: 5  Pain Location: L foot  Pain Descriptors / Indicators: Tender Pain Intervention(s): Monitored during session;Repositioned    Home Living                      Prior Function            PT Goals (current goals can now be found in the care plan section) Acute Rehab PT Goals Patient Stated Goal: to go home  Potential to  Achieve Goals: Good Progress towards PT goals: Progressing toward goals    Frequency    Min 3X/week      PT Plan Current plan remains appropriate    Co-evaluation              AM-PAC PT "6 Clicks" Daily Activity  Outcome Measure  Difficulty turning over in bed (including adjusting bedclothes, sheets and blankets)?: None Difficulty moving from lying on back to sitting on the side of the bed? : None Difficulty sitting down on and standing up from a chair with arms (e.g., wheelchair, bedside commode, etc,.)?: None Help needed moving to and from a bed to chair (including a wheelchair)?: None Help needed walking in hospital room?: A Little Help needed climbing 3-5 steps with a railing? : A  Little 6 Click Score: 22    End of Session Equipment Utilized During Treatment: Gait belt Activity Tolerance: Patient tolerated treatment well Patient left: in chair;with call bell/phone within reach Nurse Communication: Mobility status PT Visit Diagnosis: Other abnormalities of gait and mobility (R26.89);Pain Pain - Right/Left: Left Pain - part of body: Ankle and joints of foot     Time: 0867-6195 PT Time Calculation (min) (ACUTE ONLY): 17 min  Charges:  $Therapeutic Activity: 8-22 mins                    G Codes:       Governor Rooks, PTA pager [phone]    Cristela Blue 04/07/2018, 11:39 AM

## 2018-04-08 ENCOUNTER — Ambulatory Visit (HOSPITAL_COMMUNITY): Payer: Medicare Other

## 2018-04-09 ENCOUNTER — Ambulatory Visit (HOSPITAL_COMMUNITY)
Admission: RE | Disposition: A | Discharge status: Home / Self Care | Payer: Self-pay | Source: Ambulatory Visit | Attending: Vascular Surgery

## 2018-04-09 ENCOUNTER — Ambulatory Visit (HOSPITAL_COMMUNITY)
Admission: RE | Admit: 2018-04-09 | Discharge: 2018-04-09 | Disposition: A | Discharge status: Home / Self Care | Payer: Medicare Other | Source: Ambulatory Visit | Attending: Vascular Surgery | Admitting: Vascular Surgery

## 2018-04-09 DIAGNOSIS — I7092 Chronic total occlusion of artery of the extremities: Secondary | ICD-10-CM | POA: Insufficient documentation

## 2018-04-09 DIAGNOSIS — I481 Persistent atrial fibrillation: Secondary | ICD-10-CM | POA: Diagnosis not present

## 2018-04-09 DIAGNOSIS — I70269 Atherosclerosis of native arteries of extremities with gangrene, unspecified extremity: Secondary | ICD-10-CM

## 2018-04-09 DIAGNOSIS — I2510 Atherosclerotic heart disease of native coronary artery without angina pectoris: Secondary | ICD-10-CM | POA: Diagnosis not present

## 2018-04-09 DIAGNOSIS — Z8249 Family history of ischemic heart disease and other diseases of the circulatory system: Secondary | ICD-10-CM | POA: Insufficient documentation

## 2018-04-09 DIAGNOSIS — E02 Subclinical iodine-deficiency hypothyroidism: Secondary | ICD-10-CM | POA: Insufficient documentation

## 2018-04-09 DIAGNOSIS — I5032 Chronic diastolic (congestive) heart failure: Secondary | ICD-10-CM | POA: Insufficient documentation

## 2018-04-09 DIAGNOSIS — I130 Hypertensive heart and chronic kidney disease with heart failure and stage 1 through stage 4 chronic kidney disease, or unspecified chronic kidney disease: Secondary | ICD-10-CM | POA: Diagnosis not present

## 2018-04-09 DIAGNOSIS — E1122 Type 2 diabetes mellitus with diabetic chronic kidney disease: Secondary | ICD-10-CM | POA: Diagnosis not present

## 2018-04-09 DIAGNOSIS — Z952 Presence of prosthetic heart valve: Secondary | ICD-10-CM | POA: Diagnosis not present

## 2018-04-09 DIAGNOSIS — Z87891 Personal history of nicotine dependence: Secondary | ICD-10-CM | POA: Insufficient documentation

## 2018-04-09 DIAGNOSIS — N183 Chronic kidney disease, stage 3 (moderate): Secondary | ICD-10-CM | POA: Diagnosis not present

## 2018-04-09 DIAGNOSIS — I70262 Atherosclerosis of native arteries of extremities with gangrene, left leg: Secondary | ICD-10-CM | POA: Insufficient documentation

## 2018-04-09 DIAGNOSIS — E785 Hyperlipidemia, unspecified: Secondary | ICD-10-CM | POA: Insufficient documentation

## 2018-04-09 DIAGNOSIS — Z794 Long term (current) use of insulin: Secondary | ICD-10-CM | POA: Diagnosis not present

## 2018-04-09 DIAGNOSIS — G4730 Sleep apnea, unspecified: Secondary | ICD-10-CM | POA: Diagnosis not present

## 2018-04-09 DIAGNOSIS — I96 Gangrene, not elsewhere classified: Secondary | ICD-10-CM | POA: Diagnosis not present

## 2018-04-09 DIAGNOSIS — E1152 Type 2 diabetes mellitus with diabetic peripheral angiopathy with gangrene: Secondary | ICD-10-CM | POA: Diagnosis not present

## 2018-04-09 DIAGNOSIS — I70209 Unspecified atherosclerosis of native arteries of extremities, unspecified extremity: Secondary | ICD-10-CM | POA: Insufficient documentation

## 2018-04-09 HISTORY — PX: LOWER EXTREMITY ANGIOGRAPHY: CATH118251

## 2018-04-09 HISTORY — PX: ABDOMINAL AORTOGRAM: CATH118222

## 2018-04-09 HISTORY — PX: PERIPHERAL VASCULAR BALLOON ANGIOPLASTY: CATH118281

## 2018-04-09 LAB — POCT I-STAT, CHEM 8
BUN: 25 mg/dL — ABNORMAL HIGH (ref 6–20)
Calcium, Ion: 1.27 mmol/L (ref 1.15–1.40)
Chloride: 103 mmol/L (ref 101–111)
Creatinine, Ser: 1.5 mg/dL — ABNORMAL HIGH (ref 0.61–1.24)
Glucose, Bld: 137 mg/dL — ABNORMAL HIGH (ref 65–99)
HCT: 38 % — ABNORMAL LOW (ref 39.0–52.0)
Hemoglobin: 12.9 g/dL — ABNORMAL LOW (ref 13.0–17.0)
Potassium: 4.2 mmol/L (ref 3.5–5.1)
Sodium: 140 mmol/L (ref 135–145)
TCO2: 25 mmol/L (ref 22–32)

## 2018-04-09 LAB — CUP PACEART INCLINIC DEVICE CHECK
Battery Remaining Longevity: 135 mo
Battery Voltage: 3.14 V
Brady Statistic AP VP Percent: 29.99 %
Brady Statistic AP VS Percent: 0 %
Brady Statistic AS VP Percent: 69.87 %
Brady Statistic AS VS Percent: 0.13 %
Brady Statistic RA Percent Paced: 30.04 %
Brady Statistic RV Percent Paced: 99.86 %
Date Time Interrogation Session: 20190424135632
Implantable Lead Implant Date: 20190124
Implantable Lead Implant Date: 20190124
Implantable Lead Location: 753859
Implantable Lead Location: 753860
Implantable Lead Model: 5076
Implantable Lead Model: 5076
Implantable Pulse Generator Implant Date: 20190124
Lead Channel Impedance Value: 361 Ohm
Lead Channel Impedance Value: 380 Ohm
Lead Channel Impedance Value: 494 Ohm
Lead Channel Impedance Value: 513 Ohm
Lead Channel Pacing Threshold Amplitude: 0.75 V
Lead Channel Pacing Threshold Amplitude: 0.75 V
Lead Channel Pacing Threshold Pulse Width: 0.4 ms
Lead Channel Pacing Threshold Pulse Width: 0.4 ms
Lead Channel Sensing Intrinsic Amplitude: 19 mV
Lead Channel Sensing Intrinsic Amplitude: 4.875 mV
Lead Channel Setting Pacing Amplitude: 2 V
Lead Channel Setting Pacing Amplitude: 2.5 V
Lead Channel Setting Pacing Pulse Width: 0.4 ms
Lead Channel Setting Sensing Sensitivity: 4 mV

## 2018-04-09 LAB — CULTURE, BLOOD (ROUTINE X 2): Culture: NO GROWTH

## 2018-04-09 LAB — POCT ACTIVATED CLOTTING TIME
Activated Clotting Time: 230 seconds
Activated Clotting Time: 197 seconds
Activated Clotting Time: 180 seconds

## 2018-04-09 LAB — GLUCOSE, CAPILLARY
Glucose-Capillary: 146 mg/dL — ABNORMAL HIGH (ref 65–99)
Glucose-Capillary: 91 mg/dL (ref 65–99)

## 2018-04-09 SURGERY — ABDOMINAL AORTOGRAM
Anesthesia: LOCAL

## 2018-04-09 MED ORDER — FENTANYL CITRATE (PF) 100 MCG/2ML IJ SOLN
INTRAMUSCULAR | Status: AC
  Filled 2018-04-09: qty 2

## 2018-04-09 MED ORDER — ACETAMINOPHEN 325 MG PO TABS: 650.0000 mg | ORAL_TABLET | ORAL | Status: DC | PRN

## 2018-04-09 MED ORDER — HEPARIN (PORCINE) IN NACL 1000-0.9 UT/500ML-% IV SOLN
INTRAVENOUS | Status: AC
  Filled 2018-04-09: qty 1000

## 2018-04-09 MED ORDER — HEPARIN SODIUM (PORCINE) 1000 UNIT/ML IJ SOLN
INTRAMUSCULAR | Status: DC | PRN
  Administered 2018-04-09: 12000 [IU] via INTRAVENOUS

## 2018-04-09 MED ORDER — HYDRALAZINE HCL 20 MG/ML IJ SOLN: 5.0000 mg | INTRAMUSCULAR | Status: DC | PRN

## 2018-04-09 MED ORDER — SODIUM CHLORIDE 0.9% FLUSH: 3.0000 mL | Freq: Two times a day (BID) | INTRAVENOUS | Status: DC

## 2018-04-09 MED ORDER — HEPARIN SODIUM (PORCINE) 1000 UNIT/ML IJ SOLN
INTRAMUSCULAR | Status: AC
  Filled 2018-04-09: qty 1

## 2018-04-09 MED ORDER — MIDAZOLAM HCL 2 MG/2ML IJ SOLN
INTRAMUSCULAR | Status: DC | PRN
  Administered 2018-04-09: 1 mg via INTRAVENOUS

## 2018-04-09 MED ORDER — ONDANSETRON HCL 4 MG/2ML IJ SOLN: 4.0000 mg | Freq: Four times a day (QID) | INTRAMUSCULAR | Status: DC | PRN

## 2018-04-09 MED ORDER — SODIUM CHLORIDE 0.9 % IV SOLN
INTRAVENOUS | Status: DC
  Administered 2018-04-09: 11:00:00 via INTRAVENOUS

## 2018-04-09 MED ORDER — LIDOCAINE HCL (PF) 1 % IJ SOLN
INTRAMUSCULAR | Status: DC | PRN
  Administered 2018-04-09: 18 mL

## 2018-04-09 MED ORDER — SODIUM CHLORIDE 0.9% FLUSH: 3.0000 mL | INTRAVENOUS | Status: DC | PRN

## 2018-04-09 MED ORDER — SODIUM CHLORIDE 0.9 % WEIGHT BASED INFUSION: 1.0000 mL/kg/h | INTRAVENOUS | Status: DC

## 2018-04-09 MED ORDER — SODIUM CHLORIDE 0.9 % IV SOLN: 250.0000 mL | INTRAVENOUS | Status: DC | PRN

## 2018-04-09 MED ORDER — IODIXANOL 320 MG/ML IV SOLN
INTRAVENOUS | Status: DC | PRN
  Administered 2018-04-09: 105 mL via INTRAVENOUS

## 2018-04-09 MED ORDER — HEPARIN (PORCINE) IN NACL 2-0.9 UNITS/ML
INTRAMUSCULAR | Status: AC | PRN
  Administered 2018-04-09 (×2): 500 mL

## 2018-04-09 MED ORDER — MIDAZOLAM HCL 2 MG/2ML IJ SOLN
INTRAMUSCULAR | Status: AC
  Filled 2018-04-09: qty 2

## 2018-04-09 MED ORDER — MORPHINE SULFATE (PF) 10 MG/ML IV SOLN: 2.0000 mg | INTRAVENOUS | Status: DC | PRN

## 2018-04-09 MED ORDER — LIDOCAINE HCL (PF) 1 % IJ SOLN
INTRAMUSCULAR | Status: AC
  Filled 2018-04-09: qty 30

## 2018-04-09 MED ORDER — LABETALOL HCL 5 MG/ML IV SOLN: 10.0000 mg | INTRAVENOUS | Status: DC | PRN

## 2018-04-09 MED ORDER — FENTANYL CITRATE (PF) 100 MCG/2ML IJ SOLN
INTRAMUSCULAR | Status: DC | PRN
  Administered 2018-04-09 (×3): 50 ug via INTRAVENOUS

## 2018-04-09 MED ORDER — OXYCODONE HCL 5 MG PO TABS: 5.0000 mg | ORAL_TABLET | ORAL | Status: DC | PRN

## 2018-04-09 SURGICAL SUPPLY — 26 items
BALLN MUSTANG 5X80X135 (BALLOONS) ×3
BALLOON MUSTANG 5X80X135 (BALLOONS) ×2 IMPLANT
CATH NAVICROSS ANGLED 135CM (MICROCATHETER) ×3 IMPLANT
CATH OMNI FLUSH 5F 65CM (CATHETERS) ×3 IMPLANT
CATH STRAIGHT 5FR 65CM (CATHETERS) ×3 IMPLANT
CATH TEMPO 5F RIM 65CM (CATHETERS) ×3 IMPLANT
COVER PRB 48X5XTLSCP FOLD TPE (BAG) ×2 IMPLANT
COVER PROBE 5X48 (BAG) ×1
DEVICE CONTINUOUS FLUSH (MISCELLANEOUS) ×3 IMPLANT
KIT ENCORE 26 ADVANTAGE (KITS) ×3 IMPLANT
KIT MICROPUNCTURE NIT STIFF (SHEATH) ×3 IMPLANT
KIT PV (KITS) ×3 IMPLANT
SHEATH PINNACLE 5F 10CM (SHEATH) ×3 IMPLANT
SHEATH PINNACLE ST 6F 45CM (SHEATH) ×3 IMPLANT
STENT ABSOLUTE PRO 6X100X135 (Permanent Stent) ×3 IMPLANT
SYR MEDRAD MARK V 150ML (SYRINGE) ×3 IMPLANT
TAPE VIPERTRACK RADIOPAQ (MISCELLANEOUS) ×2 IMPLANT
TAPE VIPERTRACK RADIOPAQUE (MISCELLANEOUS) ×1
TRANSDUCER W/STOPCOCK (MISCELLANEOUS) ×3 IMPLANT
TRAY PV CATH (CUSTOM PROCEDURE TRAY) ×3 IMPLANT
WIRE AMPLATZ SS-J .035X180CM (WIRE) ×3 IMPLANT
WIRE AQUATRAK .035X260 ANG (WIRE) ×3 IMPLANT
WIRE BENTSON .035X145CM (WIRE) ×3 IMPLANT
WIRE G V18X300CM (WIRE) ×3 IMPLANT
WIRE HI TORQ VERSACORE J 260CM (WIRE) ×3 IMPLANT
WIRE ROSEN-J .035X180CM (WIRE) ×3 IMPLANT

## 2018-04-09 NOTE — Interval H&P Note (Signed)
   History and Physical Update  The patient was interviewed and re-examined.  The patient's previous History and Physical has been reviewed and is unchanged from my consult.  There is no change in the plan of care: aortogram, left leg runoff, and possible intervention.   I discussed with the patient the nature of angiographic procedures, especially the limited patencies of any endovascular intervention.    The patient is aware of that the risks of an angiographic procedure include but are not limited to: bleeding, infection, access site complications, renal failure, embolization, rupture of vessel, dissection, arteriovenous fistula, possible need for emergent surgical intervention, possible need for surgical procedures to treat the patient's pathology, anaphylactic reaction to contrast, and stroke and death.    The patient is aware of the risks and agrees to proceed.  Lab Results  Component Value Date   CREATININE 1.28 (H) 04/06/2018   CREATININE 1.31 (H) 04/05/2018   CREATININE 1.40 (H) 04/03/2018      Adele Barthel, MD, FACS [facility] Office: [phone] Pager: [phone]  04/09/2018, 9:46 AM

## 2018-04-09 NOTE — Op Note (Signed)
OPERATIVE NOTE   PROCEDURE: 1.  Right common femoral artery cannulation under ultrasound guidance 2.  Placement of catheter in aorta 3.  Aortogram 4.  Second order arterial selection 5.  Left leg runoff 6.  Failed attempt to recannulate popliteal artery 7.  Angioplasty of left superficial femoral artery (5 mm x 80 mm) 8.  Stenting of left superficial femoral artery (6 mm x 100 mm) 9.  Conscious sedation for 74 minutes  PRE-OPERATIVE DIAGNOSIS: left third toe gangrene  POST-OPERATIVE DIAGNOSIS: same as above   SURGEON: Adele Barthel, MD  ANESTHESIA: conscious sedation  ESTIMATED BLOOD LOSS: 50 cc  CONTRAST: 105 cc  FINDING(S):  Aorta: patent  Superior mesenteric artery: patent Celiac artery: patent   Right Left  RA patent patent  CIA patent patent  EIA patent patent  IIA patent Not visualized  CFA patent patent  SFA  Patent, distal serial stenoses: sub-total occlusion, 50%, and 75% (dissection after angioplasty, stenosis and dissection resolved after stenting and angioplasty)  PFA  patent  Pop  Patent above-the-knee segment, occluded at-the-knee segment, patent below-the-knee popliteal artery  Trif  patent  AT  patent  Pero  Patent, proximal stenosis >75%  PT  patent   SPECIMEN(S):  none  INDICATIONS:   Steven Maldonado is a 77 y.o. male who presents with left third toe gangrene.  The patient presents for: aortogram, left leg runoff, and possible intervention.  I discussed with the patient the nature of angiographic procedures, especially the limited patencies of any endovascular intervention.  The patient is aware of that the risks of an angiographic procedure include but are not limited to: bleeding, infection, access site complications, renal failure, embolization, rupture of vessel, dissection, possible need for emergent surgical intervention, possible need for surgical procedures to treat the patient's pathology, and stroke and death.  The patient is aware of the  risks and agrees to proceed.  DESCRIPTION: After full informed consent was obtained from the patient, the patient was brought back to the angiography suite.  The patient was placed supine upon the angiography table and connected to cardiopulmonary monitoring equipment.  The patient was then given conscious sedation, the amounts of which are documented in the patient's chart.  A circulating radiologic technician maintained continuous monitoring of the patient's cardiopulmonary status.  Additionally, the control room radiologic technician provided backup monitoring throughout the procedure.  The patient was prepped and drape in the standard fashion for an angiographic procedure.  At this point, attention was turned to the right groin.  Under ultrasound guidance, the subcutaneous tissue surrounding the right common femoral artery was anesthesized with 1% lidocaine with epinephrine.  Under Sonosite guidance, the patency of the artery was noted to be: patent and deep in the groin.  Ultrasound image was permanently recorded.  The artery was then cannulated with a micropuncture needle.  The microwire was advanced into the iliac arterial system.  The needle was exchanged for a microsheath, which was loaded into the common femoral artery over the wire.  The microwire was exchanged for a Bentson wire which was advanced into the aorta.  I tried to exchange the microsheath for the 5-Fr sheath but the sheath would not track due to the excess subcutaneous tissue.  I loaded a 4-Fr straight catheter over the wire and then exchanged the wire for an Amplatz wire.  A 5-Fr sheath was loaded over the wire into the right common femoral artery.  The Omniflush catheter was then loaded over the wire up  to the level of L1.  The catheter was connected to the power injector circuit.  After de-airring and de-clotting the circuit, a power injector aortogram was completed.  The findings are listed above.  Using a Bentson wire and RIM  catheter, the left common iliac artery was selected.  The catheter and wire were advanced into the external iliac artery.  I exchanged the catheter for an Omniflush catheter.  An automated left leg runoff was completed.  Based on the images, I felt an attempt at intervening was indicated.  The right superficial femoral artery needs intervention and then the total occlusion of the popliteal artery at the knee needs to be crossed.  The wire was exchanged for a Rosen wire.  The patient's right femoral sheath was exchanged for a 6-Fr Destination sheath, which I advanced until the aortic bifurcation.  Due to the patient's excess subcutaneous tissue, I could get the sheath to track on the [facility] wire.  I placed a straight catheter over the wire and exchanged the wire for an Amplatz wire.  I replaced the dilator over the wire and then advanced the sheath into the left common femoral artery.  The dilator was removed.    The patient was given 12000 units of Heparin intravenously, which was a therapeutic bolus.  After waiting a few minutes, I placed a Glidewire and Navicross catheter through the sheath.  I steered down to the sub-total occlusion in the left superficial femoral artery.  I was able to cross all of the distal superficial femoral artery stenoses and got into the above-the-knee popliteal artery artery.  I tried to cross the chronic total occlusion of the at-the-knee segment, but multiple maneuvers and wires failed to cross the calcific plaque at the level of the knee.    I felt that intervention of the distal superficial femoral artery was indicated to try to increase the perfusion of the lateral geniculate collateral flow.  The wire was exchanged for a Versacore wire.  A 5 mm x 80 mm angioplasty balloon was selected and centered on the three stenotic lesions.  This was inflated to 10 atm for 2 minutes.  I deflated the balloon and removed it.  Completion injection demonstrated a dissection, so I felt stenting  this segment was indicated.  I placed an 5 mm x 100 mm Absolute Pro self expanding stent, centered on the three stenoses.  The balloon was placed over the wire and inflated throughout the stent to expand the stent to the projected size of the superficial femoral artery.  Completion angiography demonstrated persistent right superficial femoral artery perfusion to above-the-knee popliteal artery without any dissection.  The lateral geniculate remains patent. The wire was removed and then then sheath was pulled into the right external iliac artery.  The sheath was aspirated.  No clots were present and the sheath was reloaded with heparinized saline.    The sheath will be removed in the holding area once anticoagulation has resolved.   COMPLICATIONS: none  CONDITION: stable   Adele Barthel, MD, [facility] [place] Office: [phone] Pager: [phone]  04/09/2018, 3:44 PM

## 2018-04-09 NOTE — Progress Notes (Signed)
Site area: rt groin fa sheath Site Prior to Removal:  Level 0 Pressure Applied For: 35 minutes Manual:   yes Patient Status During Pull:  stable Post Pull Site:  Level 0 Post Pull Instructions Given:  yes Post Pull Pulses Present: rt dp dopplered Dressing Applied:  Gauze and tegadwerm Bedrest begins @ 5993 Comments:

## 2018-04-09 NOTE — Discharge Instructions (Signed)

## 2018-04-10 ENCOUNTER — Encounter (HOSPITAL_COMMUNITY): Payer: Self-pay | Admitting: Vascular Surgery

## 2018-04-10 ENCOUNTER — Ambulatory Visit (HOSPITAL_COMMUNITY): Payer: Medicare Other

## 2018-04-10 ENCOUNTER — Telehealth: Payer: Self-pay | Admitting: Vascular Surgery

## 2018-04-10 MED FILL — Heparin Sod (Porcine)-NaCl IV Soln 1000 Unit/500ML-0.9%: INTRAVENOUS | Qty: 1000 | Status: AC

## 2018-04-10 NOTE — Telephone Encounter (Signed)
sch appt spk to wife 04/17/18 4pm mapping, 04/20/18 845am p/o +MD s/p Aortogram, L leg runoff, Angioplasty L SFA, Stent L SFA per stf msg

## 2018-04-13 ENCOUNTER — Ambulatory Visit (HOSPITAL_COMMUNITY): Payer: Medicare Other

## 2018-04-13 ENCOUNTER — Ambulatory Visit (INDEPENDENT_AMBULATORY_CARE_PROVIDER_SITE_OTHER): Payer: Medicare Other | Admitting: Orthopedic Surgery

## 2018-04-13 ENCOUNTER — Encounter (INDEPENDENT_AMBULATORY_CARE_PROVIDER_SITE_OTHER): Payer: Self-pay | Admitting: Orthopedic Surgery

## 2018-04-13 ENCOUNTER — Other Ambulatory Visit: Payer: Self-pay

## 2018-04-13 VITALS — Ht 71.0 in | Wt 248.0 lb

## 2018-04-13 DIAGNOSIS — I70262 Atherosclerosis of native arteries of extremities with gangrene, left leg: Secondary | ICD-10-CM | POA: Diagnosis not present

## 2018-04-13 DIAGNOSIS — I70263 Atherosclerosis of native arteries of extremities with gangrene, bilateral legs: Secondary | ICD-10-CM

## 2018-04-13 DIAGNOSIS — I6523 Occlusion and stenosis of bilateral carotid arteries: Secondary | ICD-10-CM

## 2018-04-13 DIAGNOSIS — I739 Peripheral vascular disease, unspecified: Secondary | ICD-10-CM

## 2018-04-13 DIAGNOSIS — Z48812 Encounter for surgical aftercare following surgery on the circulatory system: Secondary | ICD-10-CM

## 2018-04-13 MED ORDER — DOXYCYCLINE HYCLATE 100 MG PO TABS: 100.0000 mg | ORAL_TABLET | Freq: Two times a day (BID) | ORAL | 0 refills | Status: DC

## 2018-04-13 NOTE — Progress Notes (Signed)
Office Visit Note   Patient: Steven Maldonado           Date of Birth: 10-23-1941           MRN: [id number] Visit Date: 04/13/2018              Requested by: [name], [name] [address], [place] [address] PCP: [name], [name]  Chief Complaint  Patient presents with  . Left Foot - Pain    3rd toe gangrene       HPI: Patient is a 77 year old gentleman with severe peripheral vascular disease status post angioplasty by Dr. Vallarie Mare.  Patient is seen for evaluation of the gangrenous changes of the third toe.  Patient states that he was told that he may need a popliteal bypass graft stating that his blood vessels were extremely calcified.  Assessment & Plan: Visit Diagnoses:  1. Atherosclerosis of native artery of left lower extremity with gangrene [facility])     Plan: Plan for left foot third ray amputation.  Patient is just completed a course of Augmentin and Bactrim DS we will start him on doxycycline.  Wash the foot and symptom water and apply dry dressing.  Risks and benefits were discussed including risk of bleeding incision not healing.  Follow-Up Instructions: Return in about 2 weeks (around 04/27/2018).   Ortho Exam  Patient is alert, oriented, no adenopathy, well-dressed, normal affect, normal respiratory effort. Examination patient has been pregnant redness in the forefoot this is nontender to palpation.  He has a palpable pulse he has dry gangrenous changes of the third toe which has stabilized there is no signs of infection of the lesser toes.  There is no purulence no drainage no ascending cellulitis.  Imaging: No results found. No images are attached to the encounter.  Labs: Lab Results  Component Value Date   HGBA1C 8.1 (H) 11/28/2017   HGBA1C 11.2 (H) 10/13/2017   HGBA1C 12.4 (H) 05/01/2017   ESRSEDRATE 52 (H) 04/03/2018   CRP 1.3 (H) 04/03/2018   REPTSTATUS 04/09/2018 FINAL 04/03/2018   CULT  04/03/2018    NO GROWTH 5 DAYS Performed at [facility], [place] [address], [place], [place] [address]    Kenneth 01/02/2015     Lab Results  Component Value Date   ALBUMIN 3.4 (L) 04/03/2018   ALBUMIN 3.8 12/02/2017   ALBUMIN 4.1 05/01/2017    Body mass index is 34.59 kg/m.  Orders:  No orders of the defined types were placed in this encounter.  No orders of the defined types were placed in this encounter.    Procedures: No procedures performed  Clinical Data: No additional findings.  ROS:  All other systems negative, except as noted in the HPI. Review of Systems  Objective: Vital Signs: Ht 5\' 11"  (1.803 m)   Wt 248 lb (112.5 kg)   BMI 34.59 kg/m   Specialty Comments:  No specialty comments available.  PMFS History: Patient Active Problem List   Diagnosis Date Noted  . Atherosclerosis of native arteries of the extremities with gangrene ([place]) 04/09/2018  . Cellulitis in diabetic foot ([place]) 04/04/2018  . Injury of left toe 04/03/2018  . PAF (paroxysmal atrial fibrillation) ([place]) 12/05/2017  . Pacemaker   . Symptomatic bradycardia   . Asystole ([place])   . 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 Severe aortic stenosis   . Essential hypertension 11/14/2015  . PAD (peripheral artery disease) (  [place]) 05/24/2014  . Diabetes ([place]) 10/06/2013  . Chronic kidney disease 08/30/2013  . Hypothyroidism 08/30/2013  . Obesity (BMI 30-39.9) 07/15/2013  . OSA on CPAP 07/15/2013  . Diabetes mellitus type 2, uncomplicated ([place])   . Hyperlipidemia 01/10/2011  . CAD (coronary artery disease) 01/10/2011   Past Medical History:  Diagnosis Date  . Chronic diastolic CHF (congestive heart failure) ([place])   . CKD (chronic kidney disease), stage III ([place])   . Coronary artery disease    a. Cath February 2012 in [place] [facility], occluded RCA with collaterals  . DM type 2 (diabetes mellitus, type 2) ([place])   . Essential hypertension   . Hyperlipidemia   . Pacemaker    a. symptomatic brady  after TAVR s/p MDT PPM by Dr. Curt Bears 12/04/17  . Persistent atrial fibrillation ([place])   . PVD (peripheral vascular disease) ([place])    a. s/p R popliteal artery stenosis tx with drug-coated balloon 05/2014, followed by Dr. Fletcher Anon.  . 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 Severe aortic stenosis    a. 12/02/17: s/p TAVR  . Skin cancer   . Sleep apnea with use of continuous positive airway pressure (CPAP)    04-11-11 AHI was 32.9 and titrated to 15 cm H20, DME is [facility]  . Subclinical hypothyroidism     Family History  Problem Relation Age of Onset  . Diabetes Mother   . Heart attack Mother   . Hypertension Mother   . Heart attack Father   . Heart failure Father   . Hypertension Father   . Diabetes Father   . Diabetes Sister   . Diabetes Brother   . Diabetes Other   . Diabetes Daughter        TYPE ll  . Heart Problems Daughter   . Hypertension Sister   . Hypertension Brother   . Stroke Brother     Past Surgical History:  Procedure Laterality Date  . ABDOMINAL ANGIOGRAM N/A 06/08/2014   Procedure: ABDOMINAL ANGIOGRAM;  Surgeon: Wellington Hampshire, MD;  [facility] CATH LAB;  Service: Cardiovascular;  Laterality: N/A;  . ABDOMINAL AORTOGRAM N/A 04/09/2018   Procedure: ABDOMINAL AORTOGRAM;  Surgeon: Conrad [place], MD;  Location: [place] CV LAB;  Service: Cardiovascular;  Laterality: N/A;  . APPENDECTOMY  1965  . CARDIAC CATHETERIZATION  12/2010  . CARDIOVERSION  07/2011  . CARDIOVERSION N/A 04/18/2014   Procedure: CARDIOVERSION;  Surgeon: Dorothy Spark, MD;  Location: [place];  Service: Cardiovascular;  Laterality: N/A;  . CARDIOVERSION N/A 11/03/2015   Procedure: CARDIOVERSION;  Surgeon: Lelon Perla, MD;  Location: [facility] ENDOSCOPY;  Service: Cardiovascular;  Laterality: N/A;  . CARDIOVERSION N/A 05/08/2017   Procedure: CARDIOVERSION;  Surgeon: Dorothy Spark, MD;  Location: [facility] ENDOSCOPY;  Service: Cardiovascular;  Laterality: N/A;  . CARDIOVERSION N/A 07/28/2017   Procedure: CARDIOVERSION;  Surgeon: Dorothy Spark, MD;  Location: [place];  Service: Cardiovascular;  Laterality: N/A;  . LOWER EXTREMITY ANGIOGRAM N/A 06/08/2014   Procedure: LOWER EXTREMITY ANGIOGRAM;  Surgeon: Wellington Hampshire, MD;  Location: [facility] CATH LAB;  Service: Cardiovascular;  Laterality: N/A;  . LOWER EXTREMITY ANGIOGRAPHY Left 04/09/2018   Procedure: Lower Extremity Angiography;  Surgeon: Conrad Sargeant, MD;  Location: [place] CV LAB;  Service: Cardiovascular;  Laterality: Left;  . PACEMAKER IMPLANT N/A 12/04/2017   Procedure: PACEMAKER IMPLANT;  Surgeon: Curt Bears,  Ocie Doyne, MD;  Location: [place] CV LAB;  Service: Cardiovascular;  Laterality: N/A;  . PERIPHERAL VASCULAR BALLOON ANGIOPLASTY Left 04/09/2018   Procedure: PERIPHERAL VASCULAR BALLOON ANGIOPLASTY;  Surgeon: Conrad [place]cliffe, MD;  Location: [place] CV LAB;  Service: Cardiovascular;  Laterality: Left;  SFA  . POPLITEAL ARTERY ANGIOPLASTY Right 06/08/2014   Archie Endo 06/08/2014  . RIGHT/LEFT HEART CATH AND CORONARY ANGIOGRAPHY N/A 10/08/2017   Procedure: RIGHT/LEFT HEART CATH AND CORONARY ANGIOGRAPHY;  Surgeon: Burnell Blanks, MD;  Location: [place] CV LAB;  Service: Cardiovascular;  Laterality: N/A;  . SKIN CANCER EXCISION Bilateral    "have had them cut off back of neck X 2; off left upper arm; right wrist, near right shoulder blade" (06/08/2014)  . TEE WITHOUT CARDIOVERSION N/A 12/02/2017   Procedure: TRANSESOPHAGEAL ECHOCARDIOGRAM (TEE);  Surgeon: Burnell Blanks, MD;  Location: [place];  Service: Open Heart Surgery;  Laterality: N/A;  . TEMPORARY PACEMAKER N/A 12/04/2017   Procedure: TEMPORARY PACEMAKER;  Surgeon: Leonie Man, MD;  Location: [place] CV LAB;  Service: Cardiovascular;  Laterality: N/A;  . TRANSCATHETER AORTIC VALVE REPLACEMENT, TRANSFEMORAL N/A 12/02/2017     Procedure: TRANSCATHETER AORTIC VALVE REPLACEMENT, TRANSFEMORAL;  Surgeon: Burnell Blanks, MD;  Location: [place];  Service: Open Heart Surgery;  Laterality: N/A;  using Edwards Sapien 3 Transcatheter Heart Valve size 6mm   Social History   Occupational History  . Occupation: retired    Fish farm manager: [employer]  Tobacco Use  . Smoking status: Former Smoker    Packs/day: 2.00    Years: 14.00    Pack years: 28.00    Types: Cigarettes    Last attempt to quit: 11/11/1972    Years since quitting: 45.4  . Smokeless tobacco: Never Used  Substance and Sexual Activity  . Alcohol use: No    Comment: quit in 1984  . Drug use: No  . Sexual activity: Not Currently

## 2018-04-15 ENCOUNTER — Other Ambulatory Visit: Payer: Self-pay

## 2018-04-15 ENCOUNTER — Ambulatory Visit (HOSPITAL_COMMUNITY): Payer: Medicare Other

## 2018-04-15 ENCOUNTER — Encounter (HOSPITAL_COMMUNITY): Payer: Self-pay | Admitting: *Deleted

## 2018-04-15 NOTE — Progress Notes (Signed)
Spoke with pt's wife, Steven Maldonado for pre-op call. DPR on file. Pt has long hx of cardiac disease. Pt had a TAVR in January 2019. Pt had a pacemaker placed after the TAVR. Steven Maldonado states pt has not had any recent chest pain or sob. Pt is a type 2 diabetic. Last A1C was 8.1 on 11/28/17. Steven Maldonado states pt's fasting blood sugar is usually between 70-120. Instructed Steven Maldonado to have pt take 1/2 of his regular dose of Basaglar Insulin Thursday PM. Instructed her to have pt check his blood sugar the morning of surgery when he gets up and every 2 hours until he leaves for the hospital. If blood sugar is 70 or below, treat with 1/2 cup of clear juice (apple or cranberry) and recheck blood sugar 15 minutes after drinking juice. If blood sugar continues to be 70 or below, call the Short Stay department and ask to speak to a nurse. Pt is not to take his Glipizide Friday AM nor use his Humalog insulin. Steven Maldonado voiced understanding.

## 2018-04-16 ENCOUNTER — Other Ambulatory Visit (INDEPENDENT_AMBULATORY_CARE_PROVIDER_SITE_OTHER): Payer: Self-pay | Admitting: Orthopedic Surgery

## 2018-04-16 DIAGNOSIS — I96 Gangrene, not elsewhere classified: Secondary | ICD-10-CM

## 2018-04-16 NOTE — Progress Notes (Deleted)
Postoperative Visit ([place])   History of Present Illness   Steven Maldonado is a 77 y.o. male who presents cc:  L 3rd toe gangrene.  Prior procedure include: 1. Ao, LRo, PTA+S L SFA, failed pop re-entry (04/09/18)  The patient's wounds are *** healed.  The patient notes *** resolution of lower extremity symptoms.  The patient is *** able to complete their activities of daily living.  The patient's current symptoms are: ***.  Past Medical History, Past Surgical History, Social History, Family History, Medications, Allergies, and Review of Systems were reviewed on 04/20/18 are unchanged from previous evaluation on 04/09/18.  Current Outpatient Medications  Medication Sig Dispense Refill  . amiodarone (PACERONE) 200 MG tablet Take 1 tablet (200 mg total) by mouth daily. 90 tablet 3  . amoxicillin-clavulanate (AUGMENTIN) 500-125 MG tablet Take 1 tablet (500 mg total) by mouth 3 (three) times daily. 21 tablet 0  . aspirin EC 81 MG tablet Take 81 mg by mouth daily after breakfast.     . atorvastatin (LIPITOR) 80 MG tablet Take 80 mg by mouth at bedtime.    . Blood Glucose Monitoring Suppl (FREESTYLE LITE) DEVI 1 each by Does not apply route 2 (two) times daily. 1 each 0  . CINNAMON PO Take 3,000 mg by mouth daily. 1,000mg  per capsule    . dabigatran (PRADAXA) 150 MG CAPS capsule Take 150 mg by mouth every 12 (twelve) hours.     . docusate sodium (COLACE) 100 MG capsule Take 100 mg by mouth daily after breakfast.     . doxycycline (VIBRA-TABS) 100 MG tablet Take 1 tablet (100 mg total) by mouth 2 (two) times daily. 60 tablet 0  . furosemide (LASIX) 20 MG tablet Take 1 tablet (20 mg total) by mouth daily. 90 tablet 3  . gabapentin (NEURONTIN) 300 MG capsule Take 1 capsule (300 mg total) by mouth 3 (three) times daily. 90 capsule 5  . glipiZIDE (GLUCOTROL XL) 10 MG 24 hr tablet TAKE 1 TABLET (10 MG TOTAL) BY MOUTH DAILY WITH BREAKFAST. 30 tablet 5  . glucose blood (FREESTYLE LITE) test strip 1  each by Other route 4 (four) times daily -  before meals and at bedtime. 450 each 3  . HUMALOG KWIKPEN 100 UNIT/ML KiwkPen INJECT 5 UNITS             SUBCUTANEOUSLY 3 TIMES A   DAY 15 mL 3  . Insulin Glargine (BASAGLAR KWIKPEN) 100 UNIT/ML SOPN Inject 0.65 mLs (65 Units total) into the skin at bedtime. 30 mL 0  . insulin lispro (HUMALOG) 100 UNIT/ML KiwkPen Inject 0.05 mLs (5 Units total) into the skin 3 (three) times daily. 15 mL 3  . levothyroxine (SYNTHROID, LEVOTHROID) 150 MCG tablet TAKE 1 TABLET (150 MCG TOTAL) BY MOUTH DAILY. TAKE FOR 3 WEEKS THEN FOLLOW UP WITH PROVIDER 21 tablet 0  . magnesium oxide (MAG-OX) 400 MG tablet Take 400 mg by mouth daily after breakfast.     . metoprolol tartrate (LOPRESSOR) 50 MG tablet Take 75 mg by mouth 2 (two) times daily.    . mirabegron ER (MYRBETRIQ) 25 MG TB24 tablet Take 25 mg by mouth daily after breakfast.     . sulfamethoxazole-trimethoprim (BACTRIM DS,SEPTRA DS) 800-160 MG tablet Take 1 tablet by mouth daily. 7 tablet 0   No current facility-administered medications for this visit.     ROS: ***,***   For VQI Use Only   PRE-ADM LIVING: {VQI Pre-admission Living:20973}  AMB STATUS: {VQI  Ambulatory Status:20974}   Physical Examination  ***There were no vitals filed for this visit. ***There is no height or weight on file to calculate BMI.  General {LOC:19197::"Somulent","Alert"}, {Orientation:19197::"Confused","O x 3"}, {Weight:19197::"Obese","Cachectic","WD"}, {General state of health:19197::"Ill appearing","NAD"}  Pulmonary {Chest wall:19197::"Asx chest movement","Sym exp"}, {Air movt:19197::"Decreased *** air movt","good B air movt"}, {BS:19197::"rales on ***","rhonchi on ***","wheezing on ***","CTA B"}  Cardiac {Rhythm:19197::"Irregularly, irregular rate and rhythm","RRR, Nl S1, S2"}, {Murmur:19197::"Murmur present: ***","no Murmurs"}, {Rubs:19197::"Rub present: ***","No rubs"}, {Gallop:19197::"Gallop present: ***","No S3,S4"}  Vascular  Vessel Right Left  Radial {Palpable:19197::"Not palpable","Faintly palpable","Palpable"} {Palpable:19197::"Not palpable","Faintly palpable","Palpable"}  Brachial {Palpable:19197::"Not palpable","Faintly palpable","Palpable"} {Palpable:19197::"Not palpable","Faintly palpable","Palpable"}  Carotid Palpable, {Bruit:19197::"Bruit present","No Bruit"} Palpable, {Bruit:19197::"Bruit present","No Bruit"}  Aorta Not palpable N/A  Femoral {Palpable:19197::"Not palpable","Faintly palpable","Palpable"} {Palpable:19197::"Not palpable","Faintly palpable","Palpable"}  Popliteal {Palpable:19197::"Prominently palpable","Not palpable"} {Palpable:19197::"Prominently palpable","Not palpable"}  PT {Palpable:19197::"Not palpable","Faintly palpable","Palpable"} {Palpable:19197::"Not palpable","Faintly palpable","Palpable"}  DP {Palpable:19197::"Not palpable","Faintly palpable","Palpable"} {Palpable:19197::"Not palpable","Faintly palpable","Palpable"}    Gastrointestinal soft, {Distension:19197::"distended","non-distended"}, {TTP:19197::"TTP in *** quadrant","appropriate tenderness to palpation","non-tender to palpation"}, {Guarding:19197::"Guarding and rebound in *** quadrant","No guarding or rebound"}, {HSM:19197::"HSM present","no HSM"}, {Masses:19197::"Mass present: ***","no masses"}, {Flank:19197::"CVAT on ***","Flank bruit present on ***","no CVAT B"}, {AAA:19197::"Palpable prominent aortic pulse","No palpable prominent aortic pulse"}, {Surgical incision:19197::"Surg incisions: ***","Surgical incisions well healed"," "}  Musculoskeletal M/S 5/5 throughout {MS:19197::"except ***"," "}, Extremities without ischemic changes {MS:19197::"except ***"," "}, {Edema:19197::"Edema present: ***","No edema present"},  Neurologic  Pain and light touch intact in extremities{CN:19197::" except for decreased sensation in ***"," "}, Motor exam as listed above    Medical Decision Making   Steven Maldonado is a 77 y.o. male who presents  L 3rd toe gangrene, s/p L SFA PTA+S, failed L pop recannulation.  Based on his angiographic findings, this patient needs: Cardiac clearance for L pop-pop BPG. I discussed in depth with the patient the nature of atherosclerosis, and emphasized the importance of maximal medical management including strict control of blood pressure, blood glucose, and lipid levels, obtaining regular exercise, and cessation of smoking.  The patient is aware that without maximal medical management the underlying atherosclerotic disease process will progress, limiting the benefit of any interventions. The patient is currently on a statin: Lipitor.  The patient is currently on an anti-platelet: ASA.  Thank you for allowing [place] to participate in this patient's care.   Adele Barthel, MD, FACS [facility] [place] Office: [phone] Pager: [phone]

## 2018-04-16 NOTE — Anesthesia Preprocedure Evaluation (Signed)
Anesthesia Evaluation  Patient identified by MRN, date of birth, ID band Patient awake    Reviewed: Allergy & Precautions, H&P , NPO status , Patient's Chart, lab work & pertinent test results  History of Anesthesia Complications (+) PONV  Airway Mallampati: II   Neck ROM: full    Dental   Pulmonary sleep apnea , former smoker,    breath sounds clear to auscultation       Cardiovascular hypertension, + CAD, + Past MI, + Peripheral Vascular Disease and +CHF  + dysrhythmias Atrial Fibrillation + Valvular Problems/Murmurs AS  Rhythm:regular Rate:Normal  Echo 2/19 Study Conclusions  - Left ventricle: Inferobasal hypokinesis The cavity size was   normal. Wall thickness was increased in a pattern of severe LVH.   Systolic function was normal. The estimated ejection fraction was   in the range of 55% to 60%. - Aortic valve: Post TAVR with no significant peri valvualr   regurgitation gradients have increased since 12/03/17. - Mitral valve: Severely calcified annulus. Moderately thickened,   moderately calcified    Neuro/Psych    GI/Hepatic   Endo/Other  diabetes, Type 2Hypothyroidism   Renal/GU Renal InsufficiencyRenal disease     Musculoskeletal   Abdominal   Peds  Hematology   Anesthesia Other Findings   Reproductive/Obstetrics                            Anesthesia Physical  Anesthesia Plan  ASA: III  Anesthesia Plan: General   Post-op Pain Management:    Induction: Intravenous  PONV Risk Score and Plan: 2 and Ondansetron, Dexamethasone and Treatment may vary due to age or medical condition  Airway Management Planned: LMA  Additional Equipment:   Intra-op Plan:   Post-operative Plan: Extubation in OR  Informed Consent: I have reviewed the patients History and Physical, chart, labs and discussed the procedure including the risks, benefits and alternatives for the proposed  anesthesia with the patient or authorized representative who has indicated his/her understanding and acceptance.     Plan Discussed with: CRNA, Anesthesiologist and Surgeon  Anesthesia Plan Comments:        Anesthesia Quick Evaluation

## 2018-04-17 ENCOUNTER — Encounter (HOSPITAL_COMMUNITY)
Admission: RE | Disposition: A | Discharge status: Home / Self Care | Payer: Self-pay | Source: Ambulatory Visit | Attending: Orthopedic Surgery

## 2018-04-17 ENCOUNTER — Ambulatory Visit (HOSPITAL_COMMUNITY): Payer: Medicare Other

## 2018-04-17 ENCOUNTER — Inpatient Hospital Stay (HOSPITAL_COMMUNITY): Admission: RE | Admit: 2018-04-17 | Payer: Medicare Other | Source: Ambulatory Visit

## 2018-04-17 ENCOUNTER — Other Ambulatory Visit: Payer: Self-pay

## 2018-04-17 ENCOUNTER — Encounter (HOSPITAL_COMMUNITY): Payer: Self-pay

## 2018-04-17 ENCOUNTER — Ambulatory Visit (HOSPITAL_COMMUNITY): Payer: Medicare Other | Admitting: Anesthesiology

## 2018-04-17 ENCOUNTER — Ambulatory Visit (HOSPITAL_COMMUNITY)
Admission: RE | Admit: 2018-04-17 | Discharge: 2018-04-17 | Disposition: A | Discharge status: Home / Self Care | Payer: Medicare Other | Source: Ambulatory Visit | Attending: Orthopedic Surgery | Admitting: Orthopedic Surgery

## 2018-04-17 DIAGNOSIS — I5032 Chronic diastolic (congestive) heart failure: Secondary | ICD-10-CM | POA: Diagnosis not present

## 2018-04-17 DIAGNOSIS — Z8249 Family history of ischemic heart disease and other diseases of the circulatory system: Secondary | ICD-10-CM | POA: Insufficient documentation

## 2018-04-17 DIAGNOSIS — I350 Nonrheumatic aortic (valve) stenosis: Secondary | ICD-10-CM | POA: Diagnosis not present

## 2018-04-17 DIAGNOSIS — E662 Morbid (severe) obesity with alveolar hypoventilation: Secondary | ICD-10-CM | POA: Insufficient documentation

## 2018-04-17 DIAGNOSIS — E1122 Type 2 diabetes mellitus with diabetic chronic kidney disease: Secondary | ICD-10-CM | POA: Diagnosis not present

## 2018-04-17 DIAGNOSIS — I481 Persistent atrial fibrillation: Secondary | ICD-10-CM | POA: Diagnosis not present

## 2018-04-17 DIAGNOSIS — Z6834 Body mass index (BMI) 34.0-34.9, adult: Secondary | ICD-10-CM | POA: Diagnosis not present

## 2018-04-17 DIAGNOSIS — E785 Hyperlipidemia, unspecified: Secondary | ICD-10-CM | POA: Insufficient documentation

## 2018-04-17 DIAGNOSIS — Z87891 Personal history of nicotine dependence: Secondary | ICD-10-CM | POA: Diagnosis not present

## 2018-04-17 DIAGNOSIS — I252 Old myocardial infarction: Secondary | ICD-10-CM | POA: Diagnosis not present

## 2018-04-17 DIAGNOSIS — I96 Gangrene, not elsewhere classified: Secondary | ICD-10-CM | POA: Diagnosis not present

## 2018-04-17 DIAGNOSIS — N183 Chronic kidney disease, stage 3 (moderate): Secondary | ICD-10-CM | POA: Insufficient documentation

## 2018-04-17 DIAGNOSIS — I129 Hypertensive chronic kidney disease with stage 1 through stage 4 chronic kidney disease, or unspecified chronic kidney disease: Secondary | ICD-10-CM | POA: Diagnosis not present

## 2018-04-17 DIAGNOSIS — I130 Hypertensive heart and chronic kidney disease with heart failure and stage 1 through stage 4 chronic kidney disease, or unspecified chronic kidney disease: Secondary | ICD-10-CM | POA: Diagnosis not present

## 2018-04-17 DIAGNOSIS — Z823 Family history of stroke: Secondary | ICD-10-CM | POA: Insufficient documentation

## 2018-04-17 DIAGNOSIS — I2510 Atherosclerotic heart disease of native coronary artery without angina pectoris: Secondary | ICD-10-CM | POA: Diagnosis not present

## 2018-04-17 DIAGNOSIS — I10 Essential (primary) hypertension: Secondary | ICD-10-CM | POA: Diagnosis not present

## 2018-04-17 DIAGNOSIS — Z85828 Personal history of other malignant neoplasm of skin: Secondary | ICD-10-CM | POA: Insufficient documentation

## 2018-04-17 DIAGNOSIS — E1152 Type 2 diabetes mellitus with diabetic peripheral angiopathy with gangrene: Secondary | ICD-10-CM | POA: Diagnosis not present

## 2018-04-17 DIAGNOSIS — N189 Chronic kidney disease, unspecified: Secondary | ICD-10-CM | POA: Diagnosis not present

## 2018-04-17 DIAGNOSIS — Z833 Family history of diabetes mellitus: Secondary | ICD-10-CM | POA: Insufficient documentation

## 2018-04-17 HISTORY — DX: Other specified postprocedural states: Z98.890

## 2018-04-17 HISTORY — DX: Other specified postprocedural states: R11.2

## 2018-04-17 HISTORY — PX: AMPUTATION: SHX166

## 2018-04-17 LAB — GLUCOSE, CAPILLARY
Glucose-Capillary: 179 mg/dL — ABNORMAL HIGH (ref 65–99)
Glucose-Capillary: 168 mg/dL — ABNORMAL HIGH (ref 65–99)
Glucose-Capillary: 161 mg/dL — ABNORMAL HIGH (ref 65–99)

## 2018-04-17 LAB — HEMOGLOBIN A1C
Hgb A1c MFr Bld: 11.6 % — ABNORMAL HIGH (ref 4.8–5.6)
Mean Plasma Glucose: 286.22 mg/dL

## 2018-04-17 SURGERY — AMPUTATION, FOOT, RAY
Anesthesia: General | Laterality: Left

## 2018-04-17 MED ORDER — PROPOFOL 10 MG/ML IV BOLUS
INTRAVENOUS | Status: DC | PRN
  Administered 2018-04-17: 110 mg via INTRAVENOUS

## 2018-04-17 MED ORDER — LACTATED RINGERS IV SOLN
INTRAVENOUS | Status: DC
  Administered 2018-04-17: 11:00:00 via INTRAVENOUS

## 2018-04-17 MED ORDER — LIDOCAINE HCL (CARDIAC) PF 100 MG/5ML IV SOSY
PREFILLED_SYRINGE | INTRAVENOUS | Status: DC | PRN
  Administered 2018-04-17: 100 mg via INTRAVENOUS

## 2018-04-17 MED ORDER — HYDROCODONE-ACETAMINOPHEN 5-325 MG PO TABS: 1.0000 | ORAL_TABLET | ORAL | 0 refills | Status: DC | PRN

## 2018-04-17 MED ORDER — ONDANSETRON HCL 4 MG/2ML IJ SOLN
INTRAMUSCULAR | Status: AC
  Filled 2018-04-17: qty 2

## 2018-04-17 MED ORDER — LIDOCAINE 2% (20 MG/ML) 5 ML SYRINGE
INTRAMUSCULAR | Status: AC
  Filled 2018-04-17: qty 5

## 2018-04-17 MED ORDER — FENTANYL CITRATE (PF) 250 MCG/5ML IJ SOLN
INTRAMUSCULAR | Status: DC | PRN
  Administered 2018-04-17: 50 ug via INTRAVENOUS

## 2018-04-17 MED ORDER — PHENYLEPHRINE 40 MCG/ML (10ML) SYRINGE FOR IV PUSH (FOR BLOOD PRESSURE SUPPORT)
PREFILLED_SYRINGE | INTRAVENOUS | Status: DC | PRN
  Administered 2018-04-17 (×2): 120 ug via INTRAVENOUS
  Administered 2018-04-17: 80 ug via INTRAVENOUS

## 2018-04-17 MED ORDER — HYDROCODONE-ACETAMINOPHEN 5-325 MG PO TABS
1.0000 | ORAL_TABLET | Freq: Once | ORAL | Status: AC | PRN
  Administered 2018-04-17: 1 via ORAL

## 2018-04-17 MED ORDER — FENTANYL CITRATE (PF) 100 MCG/2ML IJ SOLN
100.0000 ug | Freq: Once | INTRAMUSCULAR | Status: DC
  Filled 2018-04-17: qty 2

## 2018-04-17 MED ORDER — HYDROCODONE-ACETAMINOPHEN 5-325 MG PO TABS
ORAL_TABLET | ORAL | Status: AC
  Filled 2018-04-17: qty 1

## 2018-04-17 MED ORDER — ONDANSETRON HCL 4 MG/2ML IJ SOLN
INTRAMUSCULAR | Status: DC | PRN
  Administered 2018-04-17: 4 mg via INTRAVENOUS

## 2018-04-17 MED ORDER — PROPOFOL 10 MG/ML IV BOLUS
INTRAVENOUS | Status: AC
  Filled 2018-04-17: qty 20

## 2018-04-17 MED ORDER — DEXAMETHASONE SODIUM PHOSPHATE 10 MG/ML IJ SOLN
INTRAMUSCULAR | Status: AC
  Filled 2018-04-17: qty 1

## 2018-04-17 MED ORDER — MIDAZOLAM HCL 2 MG/2ML IJ SOLN
2.0000 mg | Freq: Once | INTRAMUSCULAR | Status: DC
  Filled 2018-04-17: qty 2

## 2018-04-17 MED ORDER — 0.9 % SODIUM CHLORIDE (POUR BTL) OPTIME
TOPICAL | Status: DC | PRN
  Administered 2018-04-17: 1000 mL

## 2018-04-17 MED ORDER — MEPERIDINE HCL 50 MG/ML IJ SOLN: 6.2500 mg | INTRAMUSCULAR | Status: DC | PRN

## 2018-04-17 MED ORDER — CHLORHEXIDINE GLUCONATE 4 % EX LIQD: 60.0000 mL | Freq: Once | CUTANEOUS | Status: DC

## 2018-04-17 MED ORDER — EPHEDRINE SULFATE-NACL 50-0.9 MG/10ML-% IV SOSY
PREFILLED_SYRINGE | INTRAVENOUS | Status: DC | PRN
  Administered 2018-04-17: 10 mg via INTRAVENOUS

## 2018-04-17 MED ORDER — MIDAZOLAM HCL 2 MG/2ML IJ SOLN
INTRAMUSCULAR | Status: AC
  Filled 2018-04-17: qty 2

## 2018-04-17 MED ORDER — CEFAZOLIN SODIUM-DEXTROSE 2-4 GM/100ML-% IV SOLN
2.0000 g | INTRAVENOUS | Status: AC
  Administered 2018-04-17: 2 g via INTRAVENOUS
  Filled 2018-04-17: qty 100

## 2018-04-17 MED ORDER — FENTANYL CITRATE (PF) 100 MCG/2ML IJ SOLN: 25.0000 ug | INTRAMUSCULAR | Status: DC | PRN

## 2018-04-17 MED ORDER — FENTANYL CITRATE (PF) 250 MCG/5ML IJ SOLN
INTRAMUSCULAR | Status: AC
  Filled 2018-04-17: qty 5

## 2018-04-17 MED ORDER — DEXAMETHASONE SODIUM PHOSPHATE 10 MG/ML IJ SOLN
INTRAMUSCULAR | Status: DC | PRN
  Administered 2018-04-17: 4 mg via INTRAVENOUS

## 2018-04-17 SURGICAL SUPPLY — 27 items
BLADE SAW SGTL MED 73X18.5 STR (BLADE) IMPLANT
BLADE SURG 21 STRL SS (BLADE) ×2 IMPLANT
BNDG COHESIVE 4X5 TAN STRL (GAUZE/BANDAGES/DRESSINGS) ×2 IMPLANT
BNDG GAUZE ELAST 4 BULKY (GAUZE/BANDAGES/DRESSINGS) ×2 IMPLANT
COVER SURGICAL LIGHT HANDLE (MISCELLANEOUS) ×4 IMPLANT
DRAPE U-SHAPE 47X51 STRL (DRAPES) ×3 IMPLANT
DRSG ADAPTIC 3X8 NADH LF (GAUZE/BANDAGES/DRESSINGS) ×2 IMPLANT
DRSG PAD ABDOMINAL 8X10 ST (GAUZE/BANDAGES/DRESSINGS) ×4 IMPLANT
DURAPREP 26ML APPLICATOR (WOUND CARE) ×2 IMPLANT
ELECT REM PT RETURN 9FT ADLT (ELECTROSURGICAL) ×2
ELECTRODE REM PT RTRN 9FT ADLT (ELECTROSURGICAL) ×1 IMPLANT
GAUZE SPONGE 4X4 12PLY STRL (GAUZE/BANDAGES/DRESSINGS) ×2 IMPLANT
GLOVE BIOGEL PI IND STRL 9 (GLOVE) ×1 IMPLANT
GLOVE BIOGEL PI INDICATOR 9 (GLOVE) ×1
GLOVE SURG ORTHO 9.0 STRL STRW (GLOVE) ×2 IMPLANT
GOWN STRL REUS W/ TWL XL LVL3 (GOWN DISPOSABLE) ×2 IMPLANT
GOWN STRL REUS W/TWL XL LVL3 (GOWN DISPOSABLE) ×2
KIT BASIN OR (CUSTOM PROCEDURE TRAY) ×2 IMPLANT
KIT TURNOVER KIT B (KITS) ×2 IMPLANT
NS IRRIG 1000ML POUR BTL (IV SOLUTION) ×2 IMPLANT
PACK ORTHO EXTREMITY (CUSTOM PROCEDURE TRAY) ×2 IMPLANT
PAD ARMBOARD 7.5X6 YLW CONV (MISCELLANEOUS) ×4 IMPLANT
STOCKINETTE IMPERVIOUS LG (DRAPES) IMPLANT
SUT ETHILON 2 0 PSLX (SUTURE) ×2 IMPLANT
TOWEL OR 17X26 10 PK STRL BLUE (TOWEL DISPOSABLE) ×2 IMPLANT
TUBE CONNECTING 12X1/4 (SUCTIONS) ×2 IMPLANT
YANKAUER SUCT BULB TIP NO VENT (SUCTIONS) ×2 IMPLANT

## 2018-04-17 NOTE — Anesthesia Postprocedure Evaluation (Signed)
Anesthesia Post Note  Patient: Steven Maldonado  Procedure(s) Performed: LEFT FOOT 3RD RAY AMPUTATION (Left )     Patient location during evaluation: PACU Anesthesia Type: General Level of consciousness: awake and alert Pain management: pain level controlled Vital Signs Assessment: post-procedure vital signs reviewed and stable Respiratory status: spontaneous breathing, nonlabored ventilation, respiratory function stable and patient connected to nasal cannula oxygen Cardiovascular status: blood pressure returned to baseline and stable Postop Assessment: no apparent nausea or vomiting Anesthetic complications: no    Last Vitals:  Vitals:   04/17/18 1230 04/17/18 1240  BP:  (!) 149/57  Pulse: 60 60  Resp: 16   Temp: (!) 36.3 C   SpO2: 98% 98%    Last Pain:  Vitals:   04/17/18 1230  TempSrc:   PainSc: 2                  Danya Spearman

## 2018-04-17 NOTE — Transfer of Care (Signed)
Immediate Anesthesia Transfer of Care Note  Patient: [name]  Procedure(s) Performed: LEFT FOOT 3RD RAY AMPUTATION (Left )  Patient Location: PACU  Anesthesia Type:General  Level of Consciousness: awake, alert , oriented and patient cooperative  Airway & Oxygen Therapy: Patient Spontanous Breathing and Patient connected to nasal cannula oxygen  Post-op Assessment: Report given to RN, Post -op Vital signs reviewed and stable and Patient moving all extremities X 4  Post vital signs: Reviewed and stable  Last Vitals:  Vitals Value Taken Time  BP 146/65 04/17/2018 11:51 AM  Temp    Pulse 62 04/17/2018 11:53 AM  Resp 14 04/17/2018 11:53 AM  SpO2 98 % 04/17/2018 11:53 AM  Vitals shown include unvalidated device data.  Last Pain:  Vitals:   04/17/18 0817  TempSrc: Oral  PainSc: 0-No pain      Patients Stated Pain Goal: 0 (38/18/29 9371)  Complications: No apparent anesthesia complications

## 2018-04-17 NOTE — H&P (Signed)
Steven Maldonado is an 77 y.o. male.   Chief Complaint: Gangrene left foot third toe.   HPI: Patient is a 77 year old gentleman with diabetes and peripheral vascular disease.  He is status post stent placement for the left lower extremity.  Past Medical History:  Diagnosis Date  . Chronic diastolic CHF (congestive heart failure) ([place])   . CKD (chronic kidney disease), stage III ([place])   . Coronary artery disease    a. Cath February 2012 in [facility], occluded RCA with collaterals  . DM type 2 (diabetes mellitus, type 2) ([place])   . Essential hypertension   . Hyperlipidemia   . Pacemaker    a. symptomatic brady after TAVR s/p MDT PPM by Dr. Curt Bears 12/04/17  . Persistent atrial fibrillation ([place])   . PONV (postoperative nausea and vomiting)    after valve surgery  . PVD (peripheral vascular disease) ([place])    a. s/p R popliteal artery stenosis tx with drug-coated balloon 05/2014, followed by Dr. Fletcher Anon.  . 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 Severe aortic stenosis    a. 12/02/17: s/p TAVR  . Skin cancer   . Sleep apnea with use of continuous positive airway pressure (CPAP)    04-11-11 AHI was 32.9 and titrated to 15 cm H20, DME is [facility]  . Subclinical hypothyroidism     Past Surgical History:  Procedure Laterality Date  . ABDOMINAL ANGIOGRAM N/A 06/08/2014   Procedure: ABDOMINAL ANGIOGRAM;  Surgeon: Wellington Hampshire, MD;  Location[facility] CATH LAB;  Service: Cardiovascular;  Laterality: N/A;  . ABDOMINAL AORTOGRAM N/A 04/09/2018   Procedure: ABDOMINAL AORTOGRAM;  Surgeon: Conrad Ravena, MD;  Location: [place] CV LAB;  Service: Cardiovascular;  Laterality: N/A;  . APPENDECTOMY  1965  . CARDIAC CATHETERIZATION  12/2010  . CARDIOVERSION  07/2011  . CARDIOVERSION N/A 04/18/2014   Procedure: CARDIOVERSION;  Surgeon: Dorothy Spark, MD;  Location: [place];  Service:  Cardiovascular;  Laterality: N/A;  . CARDIOVERSION N/A 11/03/2015   Procedure: CARDIOVERSION;  Surgeon: Lelon Perla, MD;  Location: [facility] ENDOSCOPY;  Service: Cardiovascular;  Laterality: N/A;  . CARDIOVERSION N/A 05/08/2017   Procedure: CARDIOVERSION;  Surgeon: Dorothy Spark, MD;  Location: [facility] ENDOSCOPY;  Service: Cardiovascular;  Laterality: N/A;  . CARDIOVERSION N/A 07/28/2017   Procedure: CARDIOVERSION;  Surgeon: Dorothy Spark, MD;  Location: [place];  Service: Cardiovascular;  Laterality: N/A;  . LOWER EXTREMITY ANGIOGRAM N/A 06/08/2014   Procedure: LOWER EXTREMITY ANGIOGRAM;  Surgeon: Wellington Hampshire, MD;  Location: [facility] CATH LAB;  Service: Cardiovascular;  Laterality: N/A;  . LOWER EXTREMITY ANGIOGRAPHY Left 04/09/2018   Procedure: Lower Extremity Angiography;  Surgeon: Conrad Sanford, MD;  Location: [place] CV LAB;  Service: Cardiovascular;  Laterality: Left;  . PACEMAKER IMPLANT N/A 12/04/2017   Procedure: PACEMAKER IMPLANT;  Surgeon: Constance Haw, MD;  Location: [place] CV LAB;  Service: Cardiovascular;  Laterality: N/A;  . PERIPHERAL VASCULAR BALLOON ANGIOPLASTY Left 04/09/2018   Procedure: PERIPHERAL VASCULAR BALLOON ANGIOPLASTY;  Surgeon: Conrad Penobscot, MD;  Location: [place] CV LAB;  Service: Cardiovascular;  Laterality: Left;  SFA  . POPLITEAL ARTERY ANGIOPLASTY Right 06/08/2014   Archie Endo 06/08/2014  . RIGHT/LEFT HEART CATH AND CORONARY ANGIOGRAPHY N/A 10/08/2017   Procedure: RIGHT/LEFT HEART CATH AND CORONARY ANGIOGRAPHY;  Surgeon: Burnell Blanks, MD;  Location: [place] CV LAB;  Service: Cardiovascular;  Laterality: N/A;  . SKIN CANCER EXCISION Bilateral    "have had them cut off back of neck X 2; off left upper arm; right wrist, near right shoulder blade" (06/08/2014)  . TEE WITHOUT CARDIOVERSION N/A 12/02/2017   Procedure: TRANSESOPHAGEAL ECHOCARDIOGRAM (TEE);  Surgeon: Burnell Blanks, MD;  Location: [place];  Service: Open Heart  Surgery;  Laterality: N/A;  . TEMPORARY PACEMAKER N/A 12/04/2017   Procedure: TEMPORARY PACEMAKER;  Surgeon: Leonie Man, MD;  Location: [place] CV LAB;  Service: Cardiovascular;  Laterality: N/A;  . TRANSCATHETER AORTIC VALVE REPLACEMENT, TRANSFEMORAL N/A 12/02/2017   Procedure: TRANSCATHETER AORTIC VALVE REPLACEMENT, TRANSFEMORAL;  Surgeon: Burnell Blanks, MD;  Location: [place];  Service: Open Heart Surgery;  Laterality: N/A;  using Edwards Sapien 3 Transcatheter Heart Valve size 62mm    Family History  Problem Relation Age of Onset  . Diabetes Mother   . Heart attack Mother   . Hypertension Mother   . Heart attack Father   . Heart failure Father   . Hypertension Father   . Diabetes Father   . Diabetes Sister   . Diabetes Brother   . Diabetes Other   . Diabetes Daughter        TYPE ll  . Heart Problems Daughter   . Hypertension Sister   . Hypertension Brother   . Stroke Brother    Social History:  reports that he quit smoking about 45 years ago. His smoking use included cigarettes. He has a 28.00 pack-year smoking history. He has never used smokeless tobacco. He reports that he does not drink alcohol or use drugs.  Allergies: No Known Allergies  Medications Prior to Admission  Medication Sig Dispense Refill  . amiodarone (PACERONE) 200 MG tablet Take 1 tablet (200 mg total) by mouth daily. 90 tablet 3  . amoxicillin-clavulanate (AUGMENTIN) 500-125 MG tablet Take 1 tablet (500 mg total) by mouth 3 (three) times daily. 21 tablet 0  . aspirin EC 81 MG tablet Take 81 mg by mouth daily after breakfast.     . atorvastatin (LIPITOR) 80 MG tablet Take 80 mg by mouth at bedtime.    . Blood Glucose Monitoring Suppl (FREESTYLE LITE) DEVI 1 each by Does not apply route 2 (two) times daily. 1 each 0  . CINNAMON PO Take 3,000 mg by mouth daily. 1,000mg  per capsule    . dabigatran (PRADAXA) 150 MG CAPS capsule Take 150 mg by mouth every 12 (twelve) hours.     . docusate  sodium (COLACE) 100 MG capsule Take 100 mg by mouth daily after breakfast.     . doxycycline (VIBRA-TABS) 100 MG tablet Take 1 tablet (100 mg total) by mouth 2 (two) times daily. 60 tablet 0  . furosemide (LASIX) 20 MG tablet Take 1 tablet (20 mg total) by mouth daily. 90 tablet 3  . gabapentin (NEURONTIN) 300 MG capsule Take 1 capsule (300 mg total) by mouth 3 (three) times daily. 90 capsule 5  . glipiZIDE (GLUCOTROL XL) 10 MG 24 hr tablet TAKE 1 TABLET (10 MG TOTAL) BY MOUTH DAILY WITH BREAKFAST. 30 tablet 5  . glucose blood (FREESTYLE LITE) test strip 1 each by Other route 4 (four) times daily -  before meals and at bedtime. 450 each 3  . HUMALOG KWIKPEN 100 UNIT/ML KiwkPen INJECT 5 UNITS             SUBCUTANEOUSLY 3 TIMES A   DAY 15 mL 3  . Insulin Glargine (  BASAGLAR KWIKPEN) 100 UNIT/ML SOPN Inject 0.65 mLs (65 Units total) into the skin at bedtime. 30 mL 0  . insulin lispro (HUMALOG) 100 UNIT/ML KiwkPen Inject 0.05 mLs (5 Units total) into the skin 3 (three) times daily. 15 mL 3  . levothyroxine (SYNTHROID, LEVOTHROID) 150 MCG tablet TAKE 1 TABLET (150 MCG TOTAL) BY MOUTH DAILY. TAKE FOR 3 WEEKS THEN FOLLOW UP WITH PROVIDER 21 tablet 0  . magnesium oxide (MAG-OX) 400 MG tablet Take 400 mg by mouth daily after breakfast.     . metoprolol tartrate (LOPRESSOR) 50 MG tablet Take 75 mg by mouth 2 (two) times daily.    . mirabegron ER (MYRBETRIQ) 25 MG TB24 tablet Take 25 mg by mouth daily after breakfast.     . sulfamethoxazole-trimethoprim (BACTRIM DS,SEPTRA DS) 800-160 MG tablet Take 1 tablet by mouth daily. 7 tablet 0    Results for orders placed or performed during the hospital encounter of 04/17/18 (from the past 48 hour(s))  Glucose, capillary     Status: Abnormal   Collection Time: 04/17/18  8:04 AM  Result Value Ref Range   Glucose-Capillary 179 (H) 65 - 99 mg/dL  Glucose, capillary     Status: Abnormal   Collection Time: 04/17/18 10:03 AM  Result Value Ref Range   Glucose-Capillary  168 (H) 65 - 99 mg/dL   Comment 1 Notify RN    No results found.  Review of Systems  All other systems reviewed and are negative.   Blood pressure (!) 156/67, temperature 98.5 F (36.9 C), temperature source Oral, resp. rate 18, height 5\' 11"  (1.803 m), weight 248 lb (112.5 kg), SpO2 99 %. Physical Exam  Examination patient is alert oriented no adenopathy well-dressed normal affect normal respiratory effort.  He has an antalgic gait.  Examination he has dry gangrene of the third toe of the left foot.  He does have dopplerable pulses. Assessment/Plan Assessment: Left foot third toe gangrene.  Plan: We will plan for left foot third ray amputation.  Risks and benefits were discussed including risk of the wound not healing need for revascularization further for the left lower extremity.  Patient states he understands wished to proceed at this time.  Newt Minion, MD 04/17/2018, 10:34 AM

## 2018-04-17 NOTE — Anesthesia Procedure Notes (Signed)
Procedure Name: LMA Insertion Date/Time: 04/17/2018 11:13 AM Performed by: Julieta Bellini, CRNA Pre-anesthesia Checklist: Patient identified, Emergency Drugs available, Suction available and Patient being monitored Patient Re-evaluated:Patient Re-evaluated prior to induction Oxygen Delivery Method: Circle system utilized Preoxygenation: Pre-oxygenation with 100% oxygen Induction Type: IV induction Ventilation: Mask ventilation without difficulty LMA: LMA inserted LMA Size: 5.0 Number of attempts: 1 Placement Confirmation: positive ETCO2 and breath sounds checked- equal and bilateral Tube secured with: Tape Dental Injury: Teeth and Oropharynx as per pre-operative assessment

## 2018-04-17 NOTE — Op Note (Signed)
04/17/2018  11:40 AM  PATIENT:  [name]    PRE-OPERATIVE DIAGNOSIS:  Gangrene Left 3rd Toe  POST-OPERATIVE DIAGNOSIS:  Same  PROCEDURE:  LEFT FOOT 3RD RAY AMPUTATION  SURGEON:  Newt Minion, MD  PHYSICIAN ASSISTANT:None ANESTHESIA:   General  PREOPERATIVE INDICATIONS:  AYIDEN MILLIMAN is a  78 y.o. male with a diagnosis of Gangrene Left 3rd Toe who failed conservative measures and elected for surgical management.    The risks benefits and alternatives were discussed with the patient preoperatively including but not limited to the risks of infection, bleeding, nerve injury, cardiopulmonary complications, the need for revision surgery, among others, and the patient was willing to proceed.  OPERATIVE IMPLANTS: None.  @ENCIMAGES @  OPERATIVE FINDINGS: Good petechial bleeding  OPERATIVE PROCEDURE: Patient brought the operating room underwent a general anesthetic.  After adequate levels of anesthesia were obtained patient's left lower extremity was prepped using DuraPrep draped in the sterile field a timeout was called.  AV incision was made around the third metatarsal.  The third ray was resected through the metatarsal shaft.  The gangrenous toe metatarsal and soft tissue were resected in one block of tissue.  Electrocautery was used for hemostasis.  The wound was irrigated with normal saline.  Incision was closed using 2-0 nylon.  A sterile compressive dressing was applied patient was extubated taken to PACU in stable condition.   DISCHARGE PLANNING:  Antibiotic duration: Preoperative  Weightbearing: Nonweightbearing on the left  Pain medication: Prescription for Vicodin  Dressing care/ Wound VAC: Dry dressing remain intact until follow-up  Ambulatory devices: Walker  Discharge to: Home  Follow-up: In the office 1 week post operative.

## 2018-04-17 NOTE — H&P (Signed)
Steven Maldonado is an 77 y.o. male.   Chief Complaint: Pain ulceration and swelling left foot third toe. HPI: Patient is a 77 year old gentleman with severe peripheral vascular disease status post angioplasty by Dr. Vallarie Mare.  Patient is seen for evaluation of the gangrenous changes of the third toe.  Patient states that he was told that he may need a popliteal bypass graft stating that his blood vessels were extremely calcified.    Past Medical History:  Diagnosis Date  . Chronic diastolic CHF (congestive heart failure) ([place])   . CKD (chronic kidney disease), stage III ([place])   . Coronary artery disease    a. Cath February 2012 in [facility], occluded RCA with collaterals  . DM type 2 (diabetes mellitus, type 2) ([place])   . Essential hypertension   . Hyperlipidemia   . Pacemaker    a. symptomatic brady after TAVR s/p MDT PPM by Dr. Curt Bears 12/04/17  . Persistent atrial fibrillation ([place])   . PONV (postoperative nausea and vomiting)    after valve surgery  . PVD (peripheral vascular disease) ([place])    a. s/p R popliteal artery stenosis tx with drug-coated balloon 05/2014, followed by Dr. Fletcher Anon.  . 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 Severe aortic stenosis    a. 12/02/17: s/p TAVR  . Skin cancer   . Sleep apnea with use of continuous positive airway pressure (CPAP)    04-11-11 AHI was 32.9 and titrated to 15 cm H20, DME is [facility]  . Subclinical hypothyroidism     Past Surgical History:  Procedure Laterality Date  . ABDOMINAL ANGIOGRAM N/A 06/08/2014   Procedure: ABDOMINAL ANGIOGRAM;  Surgeon: Wellington Hampshire, MD;  Location: [facility] CATH LAB;  Service: Cardiovascular;  Laterality: N/A;  . ABDOMINAL AORTOGRAM N/A 04/09/2018   Procedure: ABDOMINAL AORTOGRAM;  Surgeon: Conrad Finzel, MD;  Location: [place] CV LAB;  Service: Cardiovascular;  Laterality: N/A;  . APPENDECTOMY   1965  . CARDIAC CATHETERIZATION  12/2010  . CARDIOVERSION  07/2011  . CARDIOVERSION N/A 04/18/2014   Procedure: CARDIOVERSION;  Surgeon: Dorothy Spark, MD;  Location: [place];  Service: Cardiovascular;  Laterality: N/A;  . CARDIOVERSION N/A 11/03/2015   Procedure: CARDIOVERSION;  Surgeon: Lelon Perla, MD;  Location: [facility] ENDOSCOPY;  Service: Cardiovascular;  Laterality: N/A;  . CARDIOVERSION N/A 05/08/2017   Procedure: CARDIOVERSION;  Surgeon: Dorothy Spark, MD;  Location: [facility] ENDOSCOPY;  Service: Cardiovascular;  Laterality: N/A;  . CARDIOVERSION N/A 07/28/2017   Procedure: CARDIOVERSION;  Surgeon: Dorothy Spark, MD;  Location: [place];  Service: Cardiovascular;  Laterality: N/A;  . LOWER EXTREMITY ANGIOGRAM N/A 06/08/2014   Procedure: LOWER EXTREMITY ANGIOGRAM;  Surgeon: Wellington Hampshire, MD;  Location: [facility] CATH LAB;  Service: Cardiovascular;  Laterality: N/A;  . LOWER EXTREMITY ANGIOGRAPHY Left 04/09/2018   Procedure: Lower Extremity Angiography;  Surgeon: Conrad Anthony, MD;  Location: [place] CV LAB;  Service: Cardiovascular;  Laterality: Left;  . PACEMAKER IMPLANT N/A 12/04/2017   Procedure: PACEMAKER IMPLANT;  Surgeon: Constance Haw, MD;  Location: [place] CV LAB;  Service: Cardiovascular;  Laterality: N/A;  . PERIPHERAL VASCULAR BALLOON ANGIOPLASTY Left 04/09/2018   Procedure: PERIPHERAL VASCULAR BALLOON ANGIOPLASTY;  Surgeon: Conrad [place]ulu, MD;  Location: [place] CV LAB;  Service: Cardiovascular;  Laterality: Left;  SFA  . POPLITEAL ARTERY ANGIOPLASTY Right 06/08/2014   Archie Endo  06/08/2014  . RIGHT/LEFT HEART CATH AND CORONARY ANGIOGRAPHY N/A 10/08/2017   Procedure: RIGHT/LEFT HEART CATH AND CORONARY ANGIOGRAPHY;  Surgeon: Burnell Blanks, MD;  Location: [place] CV LAB;  Service: Cardiovascular;  Laterality: N/A;  . SKIN CANCER EXCISION Bilateral    "have had them cut off back of neck X 2; off left upper arm; right wrist, near right shoulder  blade" (06/08/2014)  . TEE WITHOUT CARDIOVERSION N/A 12/02/2017   Procedure: TRANSESOPHAGEAL ECHOCARDIOGRAM (TEE);  Surgeon: Burnell Blanks, MD;  Location: [place];  Service: Open Heart Surgery;  Laterality: N/A;  . TEMPORARY PACEMAKER N/A 12/04/2017   Procedure: TEMPORARY PACEMAKER;  Surgeon: Leonie Man, MD;  Location: [place] CV LAB;  Service: Cardiovascular;  Laterality: N/A;  . TRANSCATHETER AORTIC VALVE REPLACEMENT, TRANSFEMORAL N/A 12/02/2017   Procedure: TRANSCATHETER AORTIC VALVE REPLACEMENT, TRANSFEMORAL;  Surgeon: Burnell Blanks, MD;  Location: [place];  Service: Open Heart Surgery;  Laterality: N/A;  using Edwards Sapien 3 Transcatheter Heart Valve size 77mm    Family History  Problem Relation Age of Onset  . Diabetes Mother   . Heart attack Mother   . Hypertension Mother   . Heart attack Father   . Heart failure Father   . Hypertension Father   . Diabetes Father   . Diabetes Sister   . Diabetes Brother   . Diabetes Other   . Diabetes Daughter        TYPE ll  . Heart Problems Daughter   . Hypertension Sister   . Hypertension Brother   . Stroke Brother    Social History:  reports that he quit smoking about 45 years ago. His smoking use included cigarettes. He has a 28.00 pack-year smoking history. He has never used smokeless tobacco. He reports that he does not drink alcohol or use drugs.  Allergies: No Known Allergies  Medications Prior to Admission  Medication Sig Dispense Refill  . amiodarone (PACERONE) 200 MG tablet Take 1 tablet (200 mg total) by mouth daily. 90 tablet 3  . aspirin EC 81 MG tablet Take 81 mg by mouth daily after breakfast.     . atorvastatin (LIPITOR) 80 MG tablet Take 80 mg by mouth at bedtime.    [name] Kitchen CINNAMON PO Take 3,000 mg by mouth daily. 1,000mg  per capsule    . dabigatran (PRADAXA) 150 MG CAPS capsule Take 150 mg by mouth every 12 (twelve) hours.     . docusate sodium (COLACE) 100 MG capsule Take 100 mg by mouth daily  after breakfast.     . doxycycline (VIBRA-TABS) 100 MG tablet Take 1 tablet (100 mg total) by mouth 2 (two) times daily. 60 tablet 0  . furosemide (LASIX) 20 MG tablet Take 1 tablet (20 mg total) by mouth daily. 90 tablet 3  . gabapentin (NEURONTIN) 300 MG capsule Take 1 capsule (300 mg total) by mouth 3 (three) times daily. 90 capsule 5  . glipiZIDE (GLUCOTROL XL) 10 MG 24 hr tablet TAKE 1 TABLET (10 MG TOTAL) BY MOUTH DAILY WITH BREAKFAST. 30 tablet 5  . HUMALOG KWIKPEN 100 UNIT/ML KiwkPen INJECT 5 UNITS             SUBCUTANEOUSLY 3 TIMES A   DAY 15 mL 3  . Insulin Glargine (BASAGLAR KWIKPEN) 100 UNIT/ML SOPN Inject 0.65 mLs (65 Units total) into the skin at bedtime. 30 mL 0  . insulin lispro (HUMALOG) 100 UNIT/ML KiwkPen Inject 0.05 mLs (5 Units total) into the skin 3 (three) times  daily. 15 mL 3  . levothyroxine (SYNTHROID, LEVOTHROID) 150 MCG tablet TAKE 1 TABLET (150 MCG TOTAL) BY MOUTH DAILY. TAKE FOR 3 WEEKS THEN FOLLOW UP WITH PROVIDER 21 tablet 0  . magnesium oxide (MAG-OX) 400 MG tablet Take 400 mg by mouth daily after breakfast.     . metoprolol tartrate (LOPRESSOR) 50 MG tablet Take 75 mg by mouth 2 (two) times daily.    . mirabegron ER (MYRBETRIQ) 25 MG TB24 tablet Take 25 mg by mouth daily after breakfast.     . amoxicillin-clavulanate (AUGMENTIN) 500-125 MG tablet Take 1 tablet (500 mg total) by mouth 3 (three) times daily. 21 tablet 0  . Blood Glucose Monitoring Suppl (FREESTYLE LITE) DEVI 1 each by Does not apply route 2 (two) times daily. 1 each 0  . glucose blood (FREESTYLE LITE) test strip 1 each by Other route 4 (four) times daily -  before meals and at bedtime. 450 each 3  . sulfamethoxazole-trimethoprim (BACTRIM DS,SEPTRA DS) 800-160 MG tablet Take 1 tablet by mouth daily. 7 tablet 0    Results for orders placed or performed during the hospital encounter of 04/17/18 (from the past 48 hour(s))  Glucose, capillary     Status: Abnormal   Collection Time: 04/17/18  8:04 AM   Result Value Ref Range   Glucose-Capillary 179 (H) 65 - 99 mg/dL   No results found.  Review of Systems  All other systems reviewed and are negative.   There were no vitals taken for this visit. Physical Exam  Patient is alert, oriented, no adenopathy, well-dressed, normal affect, normal respiratory effort. Examination patient has been pregnant redness in the forefoot this is nontender to palpation.  He has a palpable pulse he has dry gangrenous changes of the third toe which has stabilized there is no signs of infection of the lesser toes.  There is no purulence no drainage no ascending cellulitis.   Assessment/Plan 1. Atherosclerosis of native artery of left lower extremity with gangrene [facility])     Plan: Plan for left foot third ray amputation.  Patient is just completed a course of Augmentin and Bactrim DS we will start him on doxycycline.  Wash the foot and symptom water and apply dry dressing.  Risks and benefits were discussed including risk of bleeding incision not healing.     Newt Minion, MD 04/17/2018, 8:11 AM

## 2018-04-17 NOTE — Progress Notes (Signed)
Orthopedic Tech Progress Note Patient Details:  Steven Maldonado 11/09/1941 [id number]  Ortho Devices Type of Ortho Device: Crutches Ortho Device/Splint Interventions: Adjustment   Post Interventions Patient Tolerated: Well Instructions Provided: Care of device   Steven Maldonado 04/17/2018, 12:34 PM

## 2018-04-18 ENCOUNTER — Other Ambulatory Visit: Payer: Self-pay | Admitting: Physician Assistant

## 2018-04-18 ENCOUNTER — Encounter (HOSPITAL_COMMUNITY): Payer: Self-pay | Admitting: Orthopedic Surgery

## 2018-04-20 ENCOUNTER — Ambulatory Visit (HOSPITAL_COMMUNITY): Payer: Medicare Other

## 2018-04-20 ENCOUNTER — Telehealth (INDEPENDENT_AMBULATORY_CARE_PROVIDER_SITE_OTHER): Payer: Self-pay | Admitting: Orthopedic Surgery

## 2018-04-20 ENCOUNTER — Encounter: Payer: Medicare Other | Admitting: Vascular Surgery

## 2018-04-20 NOTE — Telephone Encounter (Signed)
Patient had surgery on 6/7, his wife is confused and needs some direction on what he should be doing/not doing. Please advise wife [name] # [phone]

## 2018-04-20 NOTE — Telephone Encounter (Signed)
Pt is s/p a toe amputation and asked if he should be weight bearing. I advised no the pt just had surgery on 04/17/18 and should keep his foot elevated higher than his heart and should be non weight bearing until his first post op appt and will give further instruction pending eval. Voiced understanding and will call with any additional questions.

## 2018-04-22 ENCOUNTER — Encounter: Payer: Self-pay | Admitting: Physician Assistant

## 2018-04-22 ENCOUNTER — Ambulatory Visit (INDEPENDENT_AMBULATORY_CARE_PROVIDER_SITE_OTHER): Payer: Medicare Other | Admitting: Physician Assistant

## 2018-04-22 ENCOUNTER — Ambulatory Visit (HOSPITAL_COMMUNITY): Payer: Medicare Other

## 2018-04-22 ENCOUNTER — Encounter (INDEPENDENT_AMBULATORY_CARE_PROVIDER_SITE_OTHER): Payer: Self-pay | Admitting: Family

## 2018-04-22 ENCOUNTER — Ambulatory Visit (INDEPENDENT_AMBULATORY_CARE_PROVIDER_SITE_OTHER): Payer: Medicare Other | Admitting: Family

## 2018-04-22 VITALS — BP 118/60 | HR 60 | Temp 97.8°F | Resp 18 | Ht 71.0 in

## 2018-04-22 DIAGNOSIS — B3742 Candidal balanitis: Secondary | ICD-10-CM | POA: Diagnosis not present

## 2018-04-22 DIAGNOSIS — Z89422 Acquired absence of other left toe(s): Secondary | ICD-10-CM

## 2018-04-22 DIAGNOSIS — I739 Peripheral vascular disease, unspecified: Secondary | ICD-10-CM

## 2018-04-22 DIAGNOSIS — Z09 Encounter for follow-up examination after completed treatment for conditions other than malignant neoplasm: Secondary | ICD-10-CM | POA: Diagnosis not present

## 2018-04-22 DIAGNOSIS — I6523 Occlusion and stenosis of bilateral carotid arteries: Secondary | ICD-10-CM | POA: Diagnosis not present

## 2018-04-22 DIAGNOSIS — E1165 Type 2 diabetes mellitus with hyperglycemia: Secondary | ICD-10-CM | POA: Diagnosis not present

## 2018-04-22 DIAGNOSIS — I96 Gangrene, not elsewhere classified: Secondary | ICD-10-CM

## 2018-04-22 DIAGNOSIS — IMO0002 Reserved for concepts with insufficient information to code with codable children: Secondary | ICD-10-CM | POA: Insufficient documentation

## 2018-04-22 MED ORDER — CLOTRIMAZOLE 1 % EX CREA: 1.0000 "application " | TOPICAL_CREAM | Freq: Two times a day (BID) | CUTANEOUS | 0 refills | Status: DC

## 2018-04-22 MED ORDER — FLUCONAZOLE 150 MG PO TABS: ORAL_TABLET | ORAL | 0 refills | Status: DC

## 2018-04-22 NOTE — Addendum Note (Signed)
Addended by: Dondra Prader R on: 04/22/2018 04:14 PM   Modules accepted: Orders

## 2018-04-22 NOTE — Progress Notes (Signed)
Post-Op Visit Note   Patient: Steven Maldonado           Date of Birth: 1941-10-04           MRN: [id number] Visit Date: 04/22/2018 PCP: [name], [name]  Chief Complaint:  Chief Complaint  Patient presents with  . Left Foot - Routine Post Op    Left foot third ray amputation 04/17/18    HPI:  HPI The patient is a 77 year old gentleman seen 1 week status post 3rd ray amputation. Wound vac removed. Has been non weight bearing.  Is diabetic patient with history of poor control and diabetic ulcers.  Ortho Exam Incision well approximated with sutures. No drainage, erythema. No sign of infection. Minimal swelling.  Visit Diagnoses:  1. Gangrene of toe of left foot ([place])   2. Hx of amputation of lesser toe, left ([place])     Plan: minimize weight bearing. Cleanse daily. Apply dry dressings. Follow up in1 week, eval for suture removal.  Follow-Up Instructions: Return in about 8 days (around 04/30/2018).   Imaging: No results found.  Orders:  No orders of the defined types were placed in this encounter.  No orders of the defined types were placed in this encounter.    PMFS History: Patient Active Problem List   Diagnosis Date Noted  . Diabetes mellitus type 2, uncontrolled ([place]) 04/22/2018  . Hx of amputation of lesser toe, left ([place]) 04/22/2018  . Gangrene of toe of left foot ([place])   . Atherosclerosis of native arteries of the extremities with gangrene ([place]) 04/09/2018  . Cellulitis in diabetic foot ([place]) 04/04/2018  . Injury of left toe 04/03/2018  . PAF (paroxysmal atrial fibrillation) ([place]) 12/05/2017  . Pacemaker   . Symptomatic bradycardia   . Asystole ([place])   . 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 Severe aortic stenosis   . Essential hypertension 11/14/2015  . PAD (peripheral artery disease) ([place]) 05/24/2014  . Diabetes ([place]) 10/06/2013  . Chronic kidney disease 08/30/2013  . Hypothyroidism 08/30/2013  . Obesity (BMI 30-39.9)  07/15/2013  . OSA on CPAP 07/15/2013  . Diabetes mellitus type 2, uncomplicated ([place])   . Hyperlipidemia 01/10/2011  . CAD (coronary artery disease) 01/10/2011   Past Medical History:  Diagnosis Date  . Chronic diastolic CHF (congestive heart failure) ([place])   . CKD (chronic kidney disease), stage III ([place])   . Coronary artery disease    a. Cath February 2012 in [place] [facility], occluded RCA with collaterals  . DM type 2 (diabetes mellitus, type 2) ([place])   . Essential hypertension   . Hyperlipidemia   . Pacemaker    a. symptomatic brady after TAVR s/p MDT PPM by Dr. Curt Bears 12/04/17  . Persistent atrial fibrillation ([place])   . PONV (postoperative nausea and vomiting)    after valve surgery  . PVD (peripheral vascular disease) ([place])    a. s/p R popliteal artery stenosis tx with drug-coated balloon 05/2014, followed by Dr. Fletcher Anon.  . 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 Severe aortic stenosis    a. 12/02/17: s/p TAVR  . Skin cancer   . Sleep apnea with use of continuous positive airway pressure (CPAP)    04-11-11 AHI was 32.9 and titrated to 15 cm H20, DME is [facility]  . Subclinical hypothyroidism     Family History  Problem Relation  Age of Onset  . Diabetes Mother   . Heart attack Mother   . Hypertension Mother   . Heart attack Father   . Heart failure Father   . Hypertension Father   . Diabetes Father   . Diabetes Sister   . Diabetes Brother   . Diabetes Other   . Diabetes Daughter        TYPE ll  . Heart Problems Daughter   . Hypertension Sister   . Hypertension Brother   . Stroke Brother     Past Surgical History:  Procedure Laterality Date  . ABDOMINAL ANGIOGRAM N/A 06/08/2014   Procedure: ABDOMINAL ANGIOGRAM;  Surgeon: Wellington Hampshire, MD;  Location: [facility] CATH LAB;  Service: Cardiovascular;  Laterality: N/A;  . ABDOMINAL AORTOGRAM N/A 04/09/2018    Procedure: ABDOMINAL AORTOGRAM;  Surgeon: Conrad [place]crest, MD;  Location: [place] CV LAB;  Service: Cardiovascular;  Laterality: N/A;  . AMPUTATION Left 04/17/2018   Procedure: LEFT FOOT 3RD RAY AMPUTATION;  Surgeon: Newt Minion, MD;  Location: [place];  Service: Orthopedics;  Laterality: Left;  . APPENDECTOMY  1965  . CARDIAC CATHETERIZATION  12/2010  . CARDIOVERSION  07/2011  . CARDIOVERSION N/A 04/18/2014   Procedure: CARDIOVERSION;  Surgeon: Dorothy Spark, MD;  Location: [place];  Service: Cardiovascular;  Laterality: N/A;  . CARDIOVERSION N/A 11/03/2015   Procedure: CARDIOVERSION;  Surgeon: Lelon Perla, MD;  Location: [facility] ENDOSCOPY;  Service: Cardiovascular;  Laterality: N/A;  . CARDIOVERSION N/A 05/08/2017   Procedure: CARDIOVERSION;  Surgeon: Dorothy Spark, MD;  Location: [facility] ENDOSCOPY;  Service: Cardiovascular;  Laterality: N/A;  . CARDIOVERSION N/A 07/28/2017   Procedure: CARDIOVERSION;  Surgeon: Dorothy Spark, MD;  Location: [place];  Service: Cardiovascular;  Laterality: N/A;  . LOWER EXTREMITY ANGIOGRAM N/A 06/08/2014   Procedure: LOWER EXTREMITY ANGIOGRAM;  Surgeon: Wellington Hampshire, MD;  Location: [facility] CATH LAB;  Service: Cardiovascular;  Laterality: N/A;  . LOWER EXTREMITY ANGIOGRAPHY Left 04/09/2018   Procedure: Lower Extremity Angiography;  Surgeon: Conrad [place]nesfield, MD;  Location: [place] CV LAB;  Service: Cardiovascular;  Laterality: Left;  . PACEMAKER IMPLANT N/A 12/04/2017   Procedure: PACEMAKER IMPLANT;  Surgeon: Constance Haw, MD;  Location: [place] CV LAB;  Service: Cardiovascular;  Laterality: N/A;  . PERIPHERAL VASCULAR BALLOON ANGIOPLASTY Left 04/09/2018   Procedure: PERIPHERAL VASCULAR BALLOON ANGIOPLASTY;  Surgeon: Conrad [place], MD;  Location: [place] CV LAB;  Service: Cardiovascular;  Laterality: Left;  SFA  . POPLITEAL ARTERY ANGIOPLASTY Right 06/08/2014   Archie Endo 06/08/2014  . RIGHT/LEFT HEART CATH AND CORONARY ANGIOGRAPHY N/A  10/08/2017   Procedure: RIGHT/LEFT HEART CATH AND CORONARY ANGIOGRAPHY;  Surgeon: Burnell Blanks, MD;  Location: [place] CV LAB;  Service: Cardiovascular;  Laterality: N/A;  . SKIN CANCER EXCISION Bilateral    "have had them cut off back of neck X 2; off left upper arm; right wrist, near right shoulder blade" (06/08/2014)  . TEE WITHOUT CARDIOVERSION N/A 12/02/2017   Procedure: TRANSESOPHAGEAL ECHOCARDIOGRAM (TEE);  Surgeon: Burnell Blanks, MD;  Location: [place];  Service: Open Heart Surgery;  Laterality: N/A;  . TEMPORARY PACEMAKER N/A 12/04/2017   Procedure: TEMPORARY PACEMAKER;  Surgeon: Leonie Man, MD;  Location: [place] CV LAB;  Service: Cardiovascular;  Laterality: N/A;  . TRANSCATHETER AORTIC VALVE REPLACEMENT, TRANSFEMORAL N/A 12/02/2017   Procedure: TRANSCATHETER AORTIC VALVE REPLACEMENT, TRANSFEMORAL;  Surgeon: Burnell Blanks, MD;  Location: [place];  Service: Open Heart Surgery;  Laterality: N/A;  using Edwards Sapien 3 Transcatheter Heart Valve size 57mm   Social History   Occupational History  . Occupation: retired    Fish farm manager: [employer]  Tobacco Use  . Smoking status: Former Smoker    Packs/day: 2.00    Years: 14.00    Pack years: 28.00    Types: Cigarettes    Last attempt to quit: 11/11/1972    Years since quitting: 45.4  . Smokeless tobacco: Never Used  Substance and Sexual Activity  . Alcohol use: No    Comment: quit in 1984  . Drug use: No  . Sexual activity: Not Currently

## 2018-04-22 NOTE — Progress Notes (Signed)
Patient ID: Steven Maldonado MRN: [id number], DOB: 02-Dec-1940, 77 y.o. Date of Encounter: @DATE @  Chief Complaint:  Chief Complaint  Patient presents with  . Hospitalization Follow-up    HPI: 77 y.o. year old male  presents for f/u to recent hospitalization.  His wife accompanies him for visit today.  She does not usually come with him to visits. Reviewed with both of them that at 1 of his last visits with me he had promised that he was going to be compliant regarding his diabetes management. I discussed with both of them the fact that his A1c on 04/17/2018 was 11.6. His wife promises today that she "will definitely keep him straight"   Today I have reviewed the following information from his recent hospitalizations and procedures. I reviewed the note from hospitalization 04/03/2018-04/07/2018 That note includes the following:  "Patient is a77 y.o.male history of PAD (prior history of PCI to right popliteal artery) atrial fibrillation on anticoagulation, severe aortic stenosis status post TAVR, history of complete AV block requiring pacemaker placement presenting with left third toe necrotic changes after a can and drop on his foot approximately 3 weeks back. He was subsequently admitted to the hospital service for further IV antibiotics and vascular surgery evaluation.                                                                  Hospital Course   Necrotic changes to left third toe:He has been seen by vascular surgery, initially placed on empiric vancomycin, Rocephin along with Flagyl, minimal to no surrounding cellulitis at this time, he is symptom-free, nontoxic, he was also kept on heparin drip. Case discussed with Dr. Bridgett Larsson vascular surgeon on 04/06/2018. He wants to see the patient in the outpatient setting for elective arteriogram, ABIs do suggest left-sided PAD. At this time he will be replaced on Pradaxa, will stop all IV heparin along with IV antibiotics and place him on  oral Bactrim once a day and Augmentin twice daily for a week, he apparently was on doxycycline outpatient diet to this admission.  Per vascular instructions he will be discharged with outpatient vascular follow-up, patient's left third toe is not viable and after arteriogram it could be amputated.  Home RN for wound care also ordered.  At this time minimal to no surrounding cellulitis. "   I have also reviewed the updated H&P dated 04/07/2018 and Operative Note by Dr. Bridgett Larsson on 04/07/2018. Operative note by Dr. Bridgett Larsson on 04/07/2018 includes the following information:  PROCEDURE: 1.  Right common femoral artery cannulation under ultrasound guidance 2.  Placement of catheter in aorta 3.  Aortogram 4.  Second order arterial selection 5.  Left leg runoff 6.  Failed attempt to recannulate popliteal artery 7.  Angioplasty of left superficial femoral artery (5 mm x 80 mm) 8.  Stenting of left superficial femoral artery (6 mm x 100 mm) 9.  Conscious sedation for 75 minutes   I also reviewed operative note from 04/17/2018 by Dr. Sharol Given. "AV incision was made around the third metatarsal.  The third ray was resected through the metatarsal shaft.  The gangrenous toe metatarsal and soft tissue were resected in one block of tissue.  Electrocautery used for hemostasis.  Wound was irrigated with normal  saline.  Incision was closed.  Sterile compressive dressing applied."    He and wife report that he has appointment to see Dr. Sharol Given  this afternoon.  Says that he is scheduled for an arterial scan on his neck tomorrow.  Is scheduled to see Dr. Julianne Handler for cardiology later this month.  They report that they only have one issue that they needed to discuss with me today.  Reports that he is uncircumcised.  Reports that the penis under the foreskin has been irritated for the last several days.  They report that he did not have any urinary catheter during any of these procedures.  Have no other concerns to address with  me today.      Past Medical History:  Diagnosis Date  . Chronic diastolic CHF (congestive heart failure) ([place])   . CKD (chronic kidney disease), stage III ([place])   . Coronary artery disease    a. Cath February 2012 in [facility], occluded RCA with collaterals  . DM type 2 (diabetes mellitus, type 2) ([place])   . Essential hypertension   . Hyperlipidemia   . Pacemaker    a. symptomatic brady after TAVR s/p MDT PPM by Dr. Curt Bears 12/04/17  . Persistent atrial fibrillation ([place])   . PONV (postoperative nausea and vomiting)    after valve surgery  . PVD (peripheral vascular disease) ([place])    a. s/p R popliteal artery stenosis tx with drug-coated balloon 05/2014, followed by Dr. Fletcher Anon.  . 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 Severe aortic stenosis    a. 12/02/17: s/p TAVR  . Skin cancer   . Sleep apnea with use of continuous positive airway pressure (CPAP)    04-11-11 AHI was 32.9 and titrated to 15 cm H20, DME is [facility]  . Subclinical hypothyroidism      Home Meds: Outpatient Medications Prior to Visit  Medication Sig Dispense Refill  . amiodarone (PACERONE) 200 MG tablet Take 1 tablet (200 mg total) by mouth daily. 90 tablet 3  . amoxicillin-clavulanate (AUGMENTIN) 500-125 MG tablet Take 1 tablet (500 mg total) by mouth 3 (three) times daily. 21 tablet 0  . aspirin EC 81 MG tablet Take 81 mg by mouth daily after breakfast.     . atorvastatin (LIPITOR) 80 MG tablet Take 80 mg by mouth at bedtime.    . Blood Glucose Monitoring Suppl (FREESTYLE LITE) DEVI 1 each by Does not apply route 2 (two) times daily. 1 each 0  . CINNAMON PO Take 3,000 mg by mouth daily. 1,000mg  per capsule    . dabigatran (PRADAXA) 150 MG CAPS capsule Take 150 mg by mouth every 12 (twelve) hours.     . docusate sodium (COLACE) 100 MG capsule Take 100 mg by mouth daily after breakfast.     .  furosemide (LASIX) 20 MG tablet Take 1 tablet (20 mg total) by mouth daily. 90 tablet 3  . gabapentin (NEURONTIN) 300 MG capsule Take 1 capsule (300 mg total) by mouth 3 (three) times daily. 90 capsule 5  . glipiZIDE (GLUCOTROL XL) 10 MG 24 hr tablet TAKE 1 TABLET (10 MG TOTAL) BY MOUTH DAILY WITH BREAKFAST. 30 tablet 5  . glucose blood (FREESTYLE LITE) test strip 1 each by Other route 4 (four) times daily -  before meals and at bedtime. 450 each 3  . HUMALOG KWIKPEN 100 UNIT/ML KiwkPen INJECT 5 UNITS  SUBCUTANEOUSLY 3 TIMES A   DAY 15 mL 3  . HYDROcodone-acetaminophen (NORCO/VICODIN) 5-325 MG tablet Take 1 tablet by mouth every 4 (four) hours as needed for moderate pain. 30 tablet 0  . Insulin Glargine (BASAGLAR KWIKPEN) 100 UNIT/ML SOPN Inject 0.65 mLs (65 Units total) into the skin at bedtime. 30 mL 0  . insulin lispro (HUMALOG) 100 UNIT/ML KiwkPen Inject 0.05 mLs (5 Units total) into the skin 3 (three) times daily. 15 mL 3  . levothyroxine (SYNTHROID, LEVOTHROID) 150 MCG tablet TAKE 1 TABLET (150 MCG TOTAL) BY MOUTH DAILY. TAKE FOR 3 WEEKS THEN FOLLOW UP WITH PROVIDER 21 tablet 0  . magnesium oxide (MAG-OX) 400 MG tablet Take 400 mg by mouth daily after breakfast.     . metoprolol tartrate (LOPRESSOR) 50 MG tablet Take 75 mg by mouth 2 (two) times daily.    . mirabegron ER (MYRBETRIQ) 25 MG TB24 tablet Take 25 mg by mouth daily after breakfast.     . PRADAXA 150 MG CAPS capsule TAKE 1 CAPSULE EVERY 12    HOURS 180 capsule 3  . sulfamethoxazole-trimethoprim (BACTRIM DS,SEPTRA DS) 800-160 MG tablet Take 1 tablet by mouth daily. 7 tablet 0  . doxycycline (VIBRA-TABS) 100 MG tablet Take 1 tablet (100 mg total) by mouth 2 (two) times daily. 60 tablet 0  . HYDROcodone-acetaminophen (NORCO/VICODIN) 5-325 MG tablet Take 1-2 tablets by mouth every 4 (four) hours as needed for moderate pain. 30 tablet 0   No facility-administered medications prior to visit.     Allergies: No Known  Allergies  Social History   Socioeconomic History  . Marital status: Married    Spouse name: [name]  . Number of children: 1  . Years of education: 84  . Highest education level: Not on file  Occupational History  . Occupation: retired    Fish farm manager: [employer]  . Financial resource strain: Not on file  . Food insecurity:    Worry: Not on file    Inability: Not on file  . Transportation needs:    Medical: Not on file    Non-medical: Not on file  Tobacco Use  . Smoking status: Former Smoker    Packs/day: 2.00    Years: 14.00    Pack years: 28.00    Types: Cigarettes    Last attempt to quit: 11/11/1972    Years since quitting: 45.4  . Smokeless tobacco: Never Used  Substance and Sexual Activity  . Alcohol use: No    Comment: quit in 1984  . Drug use: No  . Sexual activity: Not Currently  Lifestyle  . Physical activity:    Days per week: 0 days    Minutes per session: 0 min  . Stress: Not at all  Relationships  . Social connections:    Talks on phone: Not on file    Gets together: Not on file    Attends religious service: Not on file    Active member of club or organization: Not on file    Attends meetings of clubs or organizations: Not on file    Relationship status: Not on file  . Intimate partner violence:    Fear of current or ex partner: Not on file    Emotionally abused: Not on file    Physically abused: Not on file    Forced sexual activity: Not on file  Other Topics Concern  . Not on file  Social History Narrative   Patient is married Engineer, drilling) and lives  at home with his wife.   Patient has one child and his wife has one child.   Patient is retired.   Patient has a high school education.   Patient is right-handed.   Patient drinks very little caffeine.    Family History  Problem Relation Age of Onset  . Diabetes Mother   . Heart attack Mother   . Hypertension Mother   . Heart attack Father   . Heart failure Father   .  Hypertension Father   . Diabetes Father   . Diabetes Sister   . Diabetes Brother   . Diabetes Other   . Diabetes Daughter        TYPE ll  . Heart Problems Daughter   . Hypertension Sister   . Hypertension Brother   . Stroke Brother      Review of Systems:  See HPI for pertinent ROS. All other ROS negative.    Physical Exam: Blood pressure 118/60, pulse 60, temperature 97.8 F (36.6 C), temperature source Oral, resp. rate 18, height 5\' 11"  (1.803 m), SpO2 97 %., Body mass index is 34.59 kg/m. General:  WM. Appears in no acute distress. Neck: Supple. No thyromegaly. No lymphadenopathy. Lungs: Clear bilaterally to auscultation without wheezes, rales, or rhonchi. Breathing is unlabored. Heart: RRR with S1 S2. No murmurs, rubs, or gallops. Musculoskeletal:  Strength and tone normal for age. Extremities/Skin: Dressing in place on left foot.  Wife was in room as cheperone. Pt in wheelchair--had him show me irritation with penis--he pulled back foreskin. Tip of penis with erythema.  Neuro: Alert and oriented X 3. Moves all extremities spontaneously. Gait is normal. CNII-XII grossly in tact. Psych:  Responds to questions appropriately with a normal affect.     ASSESSMENT AND PLAN:  77 y.o. year old male with   1. Hospital discharge follow-up  2. Candidal balanitis Suspect that this is candidal balanitis and discussed with him that this is also sent related to his uncontrolled blood sugar.  Discussed need for blood sugar control.  He is to take 1 Diflucan and apply the Lotrimin cream.  Follow-up if needed. - fluconazole (DIFLUCAN) 150 MG tablet; Take one time dose.  Dispense: 1 tablet; Refill: 0 - clotrimazole (LOTRIMIN) 1 % cream; Apply 1 application topically 2 (two) times daily.  Dispense: 30 g; Refill: 0  3. PAD (peripheral artery disease) ([place]) Is managed by Dr. Sharol Given and Dr. Bridgett Larsson.  4. Gangrene of toe of left foot ([place]) Is managed by Dr. Sharol Given and Dr. Bridgett Larsson.   5. Uncontrolled  type 2 diabetes mellitus with hyperglycemia ([place]) I have discussed need for compliance for blood pressure control.  Wife states that she will make sure that he is compliant.  We will have him schedule follow-up visit with me for this.   [address] [place], [place], [facility] 04/22/2018 4:26 PM

## 2018-04-23 ENCOUNTER — Ambulatory Visit: Payer: Medicare Other | Admitting: Nurse Practitioner

## 2018-04-23 ENCOUNTER — Ambulatory Visit (HOSPITAL_COMMUNITY)
Admission: RE | Admit: 2018-04-23 | Discharge: 2018-04-23 | Disposition: A | Discharge status: Home / Self Care | Payer: Medicare Other | Source: Ambulatory Visit | Attending: Cardiology | Admitting: Cardiology

## 2018-04-23 DIAGNOSIS — I6523 Occlusion and stenosis of bilateral carotid arteries: Secondary | ICD-10-CM | POA: Insufficient documentation

## 2018-04-24 ENCOUNTER — Ambulatory Visit (HOSPITAL_COMMUNITY): Payer: Medicare Other

## 2018-04-24 ENCOUNTER — Other Ambulatory Visit: Payer: Self-pay | Admitting: Physician Assistant

## 2018-04-27 ENCOUNTER — Ambulatory Visit (INDEPENDENT_AMBULATORY_CARE_PROVIDER_SITE_OTHER): Payer: Medicare Other | Admitting: Orthopedic Surgery

## 2018-04-27 ENCOUNTER — Ambulatory Visit (HOSPITAL_COMMUNITY): Payer: Medicare Other

## 2018-04-29 ENCOUNTER — Ambulatory Visit (HOSPITAL_COMMUNITY): Payer: Medicare Other

## 2018-04-30 ENCOUNTER — Encounter (INDEPENDENT_AMBULATORY_CARE_PROVIDER_SITE_OTHER): Payer: Self-pay | Admitting: Orthopedic Surgery

## 2018-04-30 ENCOUNTER — Ambulatory Visit (INDEPENDENT_AMBULATORY_CARE_PROVIDER_SITE_OTHER): Payer: Medicare Other | Admitting: Orthopedic Surgery

## 2018-04-30 DIAGNOSIS — I96 Gangrene, not elsewhere classified: Secondary | ICD-10-CM

## 2018-04-30 DIAGNOSIS — Z89422 Acquired absence of other left toe(s): Secondary | ICD-10-CM

## 2018-04-30 DIAGNOSIS — I70262 Atherosclerosis of native arteries of extremities with gangrene, left leg: Secondary | ICD-10-CM

## 2018-04-30 MED ORDER — PENTOXIFYLLINE ER 400 MG PO TBCR: 400.0000 mg | EXTENDED_RELEASE_TABLET | Freq: Three times a day (TID) | ORAL | 3 refills | Status: DC

## 2018-04-30 MED ORDER — SULFAMETHOXAZOLE-TRIMETHOPRIM 800-160 MG PO TABS: 1.0000 | ORAL_TABLET | Freq: Two times a day (BID) | ORAL | 0 refills | Status: DC

## 2018-04-30 MED ORDER — NITROGLYCERIN 0.2 MG/HR TD PT24: 0.2000 mg | MEDICATED_PATCH | Freq: Every day | TRANSDERMAL | 12 refills | Status: DC

## 2018-04-30 NOTE — Progress Notes (Signed)
Office Visit Note   Patient: Steven Maldonado           Date of Birth: [birth year]           MRN: [id number] Visit Date: 04/30/2018              Requested by: [name], [name] [address], [place] [address] PCP: [name], [name]  No chief complaint on file.     HPI: Patient is a 77 year old gentleman with peripheral vascular disease status post angiogram with balloon angioplasty approximately 4 weeks ago.  Patient presents status post third toe amputation with progressive gangrenous changes.  Assessment & Plan: Visit Diagnoses:  1. Gangrene of toe of left foot ([place])   2. Hx of amputation of lesser toe, left (H[facility])   3. Atherosclerosis of native artery of left lower extremity with gangrene ([place])     Plan: We will start the patient on a nitroglycerin patch to be changed daily Trent[place] to also improve microcirculation and will continue the Bactrim DS.  Continue dressing changes elevation nonweightbearing discussed that he could try a kneeling scooter if he has sufficient balance for this.  Follow-up with Erin in 1 week myself in 2 weeks.  Discussed with the progressive gangrenous changes that this is a microcirculatory problem that could not be improved by further angioplasty discussed that if we cannot get this better with the topical treatments we would need to proceed with additional surgery.  Follow-Up Instructions: Return in about 2 weeks (around 05/14/2018).   Ortho Exam  Patient is alert, oriented, no adenopathy, well-dressed, normal affect, normal respiratory effort. Examination patient has an abrasion of the lateral aspect of his foot from patient resting his foot in the postoperative shoe they have discontinued the postoperative shoe he also has an abrasion dorsally over his foot from the Velcro strap.  Recommend using Neosporin over this abrasion.  Examination patient has a minimal amount of swelling he has a palpable pulse Doppler was used and he has a strong  biphasic dorsalis pedis and posterior tibial pulse no signs of any vascular compromise since his recent angioplasty.  Patient does have progressive black gangrenous changes of the surgical incision with a draining hematoma.  There is no ascending cellulitis there is no purulence no abscess.  Imaging: No results found. No images are attached to the encounter.  Labs: Lab Results  Component Value Date   HGBA1C 11.6 (H) 04/17/2018   HGBA1C 8.1 (H) 11/28/2017   HGBA1C 11.2 (H) 10/13/2017   ESRSEDRATE 52 (H) 04/03/2018   CRP 1.3 (H) 04/03/2018   REPTSTATUS 04/09/2018 FINAL 04/03/2018   CULT  04/03/2018    NO GROWTH 5 DAYS Performed at [facility], [place] [address] [place], [place] [address]    [place] 01/02/2015     Lab Results  Component Value Date   ALBUMIN 3.4 (L) 04/03/2018   ALBUMIN 3.8 12/02/2017   ALBUMIN 4.1 05/01/2017    There is no height or weight on file to calculate BMI.  Orders:  No orders of the defined types were placed in this encounter.  No orders of the defined types were placed in this encounter.    Procedures: No procedures performed  Clinical Data: No additional findings.  ROS:  All other systems negative, except as noted in the HPI. Review of Systems  Objective: Vital Signs: There were no vitals taken for this visit.  Specialty Comments:  No specialty comments  available.  PMFS History: Patient Active Problem List   Diagnosis Date Noted  . Diabetes mellitus type 2, uncontrolled ([place]) 04/22/2018  . Hx of amputation of lesser toe, left ([place]) 04/22/2018  . Gangrene of toe of left foot ([place])   . Atherosclerosis of native arteries of the extremities with gangrene ([place]) 04/09/2018  . Cellulitis in diabetic foot ([place]) 04/04/2018  . Injury of left toe 04/03/2018  . PAF (paroxysmal atrial fibrillation) ([place]) 12/05/2017  . Pacemaker   . Symptomatic bradycardia   . Asystole ([place])   . 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 Severe aortic stenosis   . Essential hypertension 11/14/2015  . PAD (peripheral artery disease) ([place]) 05/24/2014  . Diabetes ([place]) 10/06/2013  . Chronic kidney disease 08/30/2013  . Hypothyroidism 08/30/2013  . Obesity (BMI 30-39.9) 07/15/2013  . OSA on CPAP 07/15/2013  . Diabetes mellitus type 2, uncomplicated ([place])   . Hyperlipidemia 01/10/2011  . CAD (coronary artery disease) 01/10/2011   Past Medical History:  Diagnosis Date  . Chronic diastolic CHF (congestive heart failure) ([place])   . CKD (chronic kidney disease), stage III ([place])   . Coronary artery disease    a. Cath February 2012 in [facility], occluded RCA with collaterals  . DM type 2 (diabetes mellitus, type 2) ([place])   . Essential hypertension   . Hyperlipidemia   . Pacemaker    a. symptomatic brady after TAVR s/p MDT PPM by Dr. Curt Bears 12/04/17  . Persistent atrial fibrillation ([place])   . PONV (postoperative nausea and vomiting)    after valve surgery  . PVD (peripheral vascular disease) ([place])    a. s/p R popliteal artery stenosis tx with drug-coated balloon 05/2014, followed by Dr. Fletcher Anon.  . 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 Severe aortic stenosis    a. 12/02/17: s/p TAVR  . Skin cancer   . Sleep apnea with use of continuous positive airway pressure (CPAP)    04-11-11 AHI was 32.9 and titrated to 15 cm H20, DME is [facility]  . Subclinical hypothyroidism     Family History  Problem Relation Age of Onset  . Diabetes Mother   . Heart attack Mother   . Hypertension Mother   . Heart attack Father   . Heart failure Father   . Hypertension Father   . Diabetes Father   . Diabetes Sister   . Diabetes Brother   . Diabetes Other   . Diabetes Daughter        TYPE ll  . Heart Problems Daughter   . Hypertension Sister   . Hypertension Brother   . Stroke Brother       Past Surgical History:  Procedure Laterality Date  . ABDOMINAL ANGIOGRAM N/A 06/08/2014   Procedure: ABDOMINAL ANGIOGRAM;  Surgeon: Wellington Hampshire, MD;  Location: [facility] CATH LAB;  Service: Cardiovascular;  Laterality: N/A;  . ABDOMINAL AORTOGRAM N/A 04/09/2018   Procedure: ABDOMINAL AORTOGRAM;  Surgeon: Conrad Halsey, MD;  Location: [facility] CV LAB;  Service: Cardiovascular;  Laterality: N/A;  . AMPUTATION Left 04/17/2018   Procedure: LEFT FOOT 3RD RAY AMPUTATION;  Surgeon: Newt Minion, MD;  Location: [facility];  Service: Orthopedics;  Laterality: Left;  . APPENDECTOMY  1965  . CARDIAC CATHETERIZATION  12/2010  . CARDIOVERSION  07/2011  . CARDIOVERSION N/A 04/18/2014   Procedure: CARDIOVERSION;  Surgeon: Jamse Belfast  Meda Coffee, MD;  Location: [facility];  Service: Cardiovascular;  Laterality: N/A;  . CARDIOVERSION N/A 11/03/2015   Procedure: CARDIOVERSION;  Surgeon: Lelon Perla, MD;  Location: [facility] ENDOSCOPY;  Service: Cardiovascular;  Laterality: N/A;  . CARDIOVERSION N/A 05/08/2017   Procedure: CARDIOVERSION;  Surgeon: Dorothy Spark, MD;  Location: [facility] ENDOSCOPY;  Service: Cardiovascular;  Laterality: N/A;  . CARDIOVERSION N/A 07/28/2017   Procedure: CARDIOVERSION;  Surgeon: Dorothy Spark, MD;  Location: [facility];  Service: Cardiovascular;  Laterality: N/A;  . LOWER EXTREMITY ANGIOGRAM N/A 06/08/2014   Procedure: LOWER EXTREMITY ANGIOGRAM;  Surgeon: Wellington Hampshire, MD;  Location: [facility] CATH LAB;  Service: Cardiovascular;  Laterality: N/A;  . LOWER EXTREMITY ANGIOGRAPHY Left 04/09/2018   Procedure: Lower Extremity Angiography;  Surgeon: Conrad [place], MD;  Location: [place] CV LAB;  Service: Cardiovascular;  Laterality: Left;  . PACEMAKER IMPLANT N/A 12/04/2017   Procedure: PACEMAKER IMPLANT;  Surgeon: Constance Haw, MD;  Location: [place] CV LAB;  Service: Cardiovascular;  Laterality: N/A;  . PERIPHERAL VASCULAR BALLOON ANGIOPLASTY Left 04/09/2018   Procedure: PERIPHERAL  VASCULAR BALLOON ANGIOPLASTY;  Surgeon: Conrad [place], MD;  Location: [place] CV LAB;  Service: Cardiovascular;  Laterality: Left;  SFA  . POPLITEAL ARTERY ANGIOPLASTY Right 06/08/2014   Archie Endo 06/08/2014  . RIGHT/LEFT HEART CATH AND CORONARY ANGIOGRAPHY N/A 10/08/2017   Procedure: RIGHT/LEFT HEART CATH AND CORONARY ANGIOGRAPHY;  Surgeon: Burnell Blanks, MD;  Location: [place] CV LAB;  Service: Cardiovascular;  Laterality: N/A;  . SKIN CANCER EXCISION Bilateral    "have had them cut off back of neck X 2; off left upper arm; right wrist, near right shoulder blade" (06/08/2014)  . TEE WITHOUT CARDIOVERSION N/A 12/02/2017   Procedure: TRANSESOPHAGEAL ECHOCARDIOGRAM (TEE);  Surgeon: Burnell Blanks, MD;  Location: [place];  Service: Open Heart Surgery;  Laterality: N/A;  . TEMPORARY PACEMAKER N/A 12/04/2017   Procedure: TEMPORARY PACEMAKER;  Surgeon: Leonie Man, MD;  Location: [place] CV LAB;  Service: Cardiovascular;  Laterality: N/A;  . TRANSCATHETER AORTIC VALVE REPLACEMENT, TRANSFEMORAL N/A 12/02/2017   Procedure: TRANSCATHETER AORTIC VALVE REPLACEMENT, TRANSFEMORAL;  Surgeon: Burnell Blanks, MD;  Location: [place];  Service: Open Heart Surgery;  Laterality: N/A;  using Edwards Sapien 3 Transcatheter Heart Valve size 66mm   Social History   Occupational History  . Occupation: retired    Fish farm manager: [employer]  Tobacco Use  . Smoking status: Former Smoker    Packs/day: 2.00    Years: 14.00    Pack years: 28.00    Types: Cigarettes    Last attempt to quit: 11/11/1972    Years since quitting: 45.4  . Smokeless tobacco: Never Used  Substance and Sexual Activity  . Alcohol use: No    Comment: quit in 1984  . Drug use: No  . Sexual activity: Not Currently

## 2018-05-01 ENCOUNTER — Ambulatory Visit (HOSPITAL_COMMUNITY): Payer: Medicare Other

## 2018-05-03 NOTE — Progress Notes (Signed)
[facility]  PATIENT: Steven Maldonado DOB: 08/23/1941   REASON FOR VISIT: follow up for OSA, new machine  HISTORY FROM: Patient    HISTORY OF PRESENT ILLNESS:UPDATE 6/24/2019CM Steven Maldonado, 77 year old male returns for follow-up with history of obstructive sleep apnea here for CPAP compliance.  He has a new machine but no card.  His equipment company is not present.  Patient states he is doing well on CPAP and he has been compliant.  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  This  has not healed.  He is seated in a wheelchair.  ESS score is 4.  He returns for reevaluation    Machine is over 73 years old. 02-03-2018 . CD I have the pleasure of seeing Steven Maldonado today, a 77 year old Caucasian right-handed gentleman who has a long-standing history of obstructive sleep apnea and has always been compliant on CPAP.  Steven Maldonado had severe cardiac disease, and had been admitted for a surgery he had an aortic valve replacement. He also now has a pacemaker.  He continues to be highly compliant with CPAP use the machine 28 out of the last 30 days, with 6 hours and 23 minutes average use, set pressure is 14 cmH2O with 3 cm expiratory pressure relief, residual AHI is only 2.2.  He does have  high air leaks on a FFM , he is due to receive a new machine. He snored very loud on nasal pillow. He likes to be refitted for an interface. [employer] [facility] - he wants a cleaning machine .   Steven Maldonado is a 77 year old male with a history of obstructive sleep apnea on CPAP. He returns today for follow-up. He is currently using the CPAP 25 out of 30 days for compliance of 83%. He uses his machine greater than 4 hours 23 out of 30 days for compliance of 77%. On average he uses his machine 7 hours and 17 minutes. He is on a pressure of 14 cm water with EPR 3. His residual AHI is 1.8. He does have a significant leak in the 95th percentile at 49.3 L/m. He  states that he is only able to change his straps every 6 months and they do get loose quickly. He also states that he does know that he pushes his mask off his face at night as his wife has witnessed him do this. He states that he is unaware that he does this. He does note that he does feel air leaking around the mask when he puts it on. He returns today for an evaluation  REVIEW OF SYSTEMS: Full 14 system review of systems performed and notable only for those listed, all others are neg:  Constitutional: neg  Cardiovascular: neg Ear/Nose/Throat: neg  Skin: neg Eyes: neg Respiratory: neg Gastroitestinal: neg  Hematology/Lymphatic: neg  Endocrine: neg Musculoskeletal:neg Allergy/Immunology: neg Neurological: neg Psychiatric: neg Sleep : Obstructive sleep apnea with CPAP   ALLERGIES: No Known Allergies  HOME MEDICATIONS: Outpatient Medications Prior to Visit  Medication Sig Dispense Refill  . amiodarone (PACERONE) 200 MG tablet Take 1 tablet (200 mg total) by mouth daily. 90 tablet 3  . amoxicillin-clavulanate (AUGMENTIN) 500-125 MG tablet Take 1 tablet (500 mg total) by mouth 3 (three) times daily. 21 tablet 0  . aspirin EC 81 MG tablet Take 81 mg by mouth daily after breakfast.     . atorvastatin (LIPITOR) 80 MG tablet Take 80 mg by  mouth at bedtime.    . Blood Glucose Monitoring Suppl (FREESTYLE LITE) DEVI 1 each by Does not apply route 2 (two) times daily. 1 each 0  . CINNAMON PO Take 3,000 mg by mouth daily. 1,000mg  per capsule    . clotrimazole (LOTRIMIN) 1 % cream Apply 1 application topically 2 (two) times daily. 30 g 0  . dabigatran (PRADAXA) 150 MG CAPS capsule Take 150 mg by mouth every 12 (twelve) hours.     . docusate sodium (COLACE) 100 MG capsule Take 100 mg by mouth daily after breakfast.     . fluconazole (DIFLUCAN) 150 MG tablet Take one time dose. 1 tablet 0  . furosemide (LASIX) 20 MG tablet Take 1 tablet (20 mg total) by mouth daily. 90 tablet 3  . gabapentin  (NEURONTIN) 300 MG capsule Take 1 capsule (300 mg total) by mouth 3 (three) times daily. 90 capsule 5  . glipiZIDE (GLUCOTROL XL) 10 MG 24 hr tablet TAKE 1 TABLET (10 MG TOTAL) BY MOUTH DAILY WITH BREAKFAST. 30 tablet 5  . glucose blood (FREESTYLE LITE) test strip 1 each by Other route 4 (four) times daily -  before meals and at bedtime. 450 each 3  . HUMALOG KWIKPEN 100 UNIT/ML KiwkPen INJECT 5 UNITS             SUBCUTANEOUSLY 3 TIMES A   DAY 15 mL 3  . HYDROcodone-acetaminophen (NORCO/VICODIN) 5-325 MG tablet Take 1 tablet by mouth every 4 (four) hours as needed for moderate pain. 30 tablet 0  . Insulin Glargine (BASAGLAR KWIKPEN) 100 UNIT/ML SOPN Inject 0.65 mLs (65 Units total) into the skin at bedtime. 30 mL 0  . insulin lispro (HUMALOG) 100 UNIT/ML KiwkPen Inject 0.05 mLs (5 Units total) into the skin 3 (three) times daily. 15 mL 3  . levothyroxine (SYNTHROID, LEVOTHROID) 150 MCG tablet TAKE 1 TABLET (150 MCG TOTAL) BY MOUTH DAILY. TAKE FOR 3 WEEKS THEN FOLLOW UP WITH PROVIDER 21 tablet 0  . magnesium oxide (MAG-OX) 400 MG tablet Take 400 mg by mouth daily after breakfast.     . metoprolol tartrate (LOPRESSOR) 50 MG tablet Take 75 mg by mouth 2 (two) times daily.    [name] Kitchen MYRBETRIQ 25 MG TB24 tablet TAKE 1 TABLET BY MOUTH EVERY DAY 90 tablet 0  . nitroGLYCERIN (NITRODUR - DOSED IN MG/24 HR) 0.2 mg/hr patch Place 1 patch (0.2 mg total) onto the skin daily. 30 patch 12  . pentoxifylline (TRENTAL) 400 MG CR tablet Take 1 tablet (400 mg total) by mouth 3 (three) times daily with meals. 90 tablet 3  . PRADAXA 150 MG CAPS capsule TAKE 1 CAPSULE EVERY 12    HOURS 180 capsule 3  . sulfamethoxazole-trimethoprim (BACTRIM DS,SEPTRA DS) 800-160 MG tablet Take 1 tablet by mouth daily. 7 tablet 0  . mirabegron ER (MYRBETRIQ) 25 MG TB24 tablet Take 25 mg by mouth daily after breakfast.     . sulfamethoxazole-trimethoprim (BACTRIM DS,SEPTRA DS) 800-160 MG tablet Take 1 tablet by mouth 2 (two) times daily. 20  tablet 0   No facility-administered medications prior to visit.     PAST MEDICAL HISTORY: Past Medical History:  Diagnosis Date  . Chronic diastolic CHF (congestive heart failure) ([place])   . CKD (chronic kidney disease), stage III ([place])   . Coronary artery disease    a. Cath February 2012 in [facility], occluded RCA with collaterals  . DM type 2 (diabetes mellitus, type 2) ([place])   . Essential hypertension   .  Hyperlipidemia   . Pacemaker    a. symptomatic brady after TAVR s/p MDT PPM by Dr. Curt Bears 12/04/17  . Persistent atrial fibrillation ([place])   . PONV (postoperative nausea and vomiting)    after valve surgery  . PVD (peripheral vascular disease) ([place])    a. s/p R popliteal artery stenosis tx with drug-coated balloon 05/2014, followed by Dr. Fletcher Anon.  . 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 Severe aortic stenosis    a. 12/02/17: s/p TAVR  . Skin cancer   . Sleep apnea with use of continuous positive airway pressure (CPAP)    04-11-11 AHI was 32.9 and titrated to 15 cm H20, DME is [facility]  . Subclinical hypothyroidism     PAST SURGICAL HISTORY: Past Surgical History:  Procedure Laterality Date  . ABDOMINAL ANGIOGRAM N/A 06/08/2014   Procedure: ABDOMINAL ANGIOGRAM;  Surgeon: Wellington Hampshire, MD;  Location: [facility] CATH LAB;  Service: Cardiovascular;  Laterality: N/A;  . ABDOMINAL AORTOGRAM N/A 04/09/2018   Procedure: ABDOMINAL AORTOGRAM;  Surgeon: Conrad [place], MD;  Location: [place] CV LAB;  Service: Cardiovascular;  Laterality: N/A;  . AMPUTATION Left 04/17/2018   Procedure: LEFT FOOT 3RD RAY AMPUTATION;  Surgeon: Newt Minion, MD;  Location: [place];  Service: Orthopedics;  Laterality: Left;  . APPENDECTOMY  1965  . CARDIAC CATHETERIZATION  12/2010  . CARDIOVERSION  07/2011  . CARDIOVERSION N/A 04/18/2014   Procedure: CARDIOVERSION;  Surgeon: Dorothy Spark, MD;  Location: [place];  Service: Cardiovascular;  Laterality: N/A;  . CARDIOVERSION N/A 11/03/2015   Procedure: CARDIOVERSION;  Surgeon: Lelon Perla, MD;  Location: [facility] ENDOSCOPY;  Service: Cardiovascular;  Laterality: N/A;  . CARDIOVERSION N/A 05/08/2017   Procedure: CARDIOVERSION;  Surgeon: Dorothy Spark, MD;  Location: [facility] ENDOSCOPY;  Service: Cardiovascular;  Laterality: N/A;  . CARDIOVERSION N/A 07/28/2017   Procedure: CARDIOVERSION;  Surgeon: Dorothy Spark, MD;  Location: [facility];  Service: Cardiovascular;  Laterality: N/A;  . LOWER EXTREMITY ANGIOGRAM N/A 06/08/2014   Procedure: LOWER EXTREMITY ANGIOGRAM;  Surgeon: Wellington Hampshire, MD;  [facility];  Service: Cardiovascular;  Laterality: N/A;  . LOWER EXTREMITY ANGIOGRAPHY Left 04/09/2018   Procedure: Lower Extremity Angiography;  Surgeon: Conrad Manvel, MD;  Location: [place] CV LAB;  Service: Cardiovascular;  Laterality: Left;  . PACEMAKER IMPLANT N/A 12/04/2017   Procedure: PACEMAKER IMPLANT;  Surgeon: Constance Haw, MD;  Location: [place] CV LAB;  Service: Cardiovascular;  Laterality: N/A;  . PERIPHERAL VASCULAR BALLOON ANGIOPLASTY Left 04/09/2018   Procedure: PERIPHERAL VASCULAR BALLOON ANGIOPLASTY;  Surgeon: Conrad Latimer, MD;  Location: [place] CV LAB;  Service: Cardiovascular;  Laterality: Left;  SFA  . POPLITEAL ARTERY ANGIOPLASTY Right 06/08/2014   Archie Endo 06/08/2014  . RIGHT/LEFT HEART CATH AND CORONARY ANGIOGRAPHY N/A 10/08/2017   Procedure: RIGHT/LEFT HEART CATH AND CORONARY ANGIOGRAPHY;  Surgeon: Burnell Blanks, MD;  Location: [place] CV LAB;  Service: Cardiovascular;  Laterality: N/A;  . SKIN CANCER EXCISION Bilateral    "have had them cut off back of neck X 2; off left upper arm; right wrist, near right shoulder blade" (06/08/2014)  . TEE WITHOUT CARDIOVERSION N/A 12/02/2017   Procedure: TRANSESOPHAGEAL ECHOCARDIOGRAM (TEE);  Surgeon: Burnell Blanks,  MD;  Location: [place];  Service: Open Heart Surgery;  Laterality: N/A;  . TEMPORARY PACEMAKER N/A 12/04/2017  Procedure: TEMPORARY PACEMAKER;  Surgeon: Leonie Man, MD;  Location: [place] CV LAB;  Service: Cardiovascular;  Laterality: N/A;  . TRANSCATHETER AORTIC VALVE REPLACEMENT, TRANSFEMORAL N/A 12/02/2017   Procedure: TRANSCATHETER AORTIC VALVE REPLACEMENT, TRANSFEMORAL;  Surgeon: Burnell Blanks, MD;  Location: [place];  Service: Open Heart Surgery;  Laterality: N/A;  using Edwards Sapien 3 Transcatheter Heart Valve size 54mm    FAMILY HISTORY: Family History  Problem Relation Age of Onset  . Diabetes Mother   . Heart attack Mother   . Hypertension Mother   . Heart attack Father   . Heart failure Father   . Hypertension Father   . Diabetes Father   . Diabetes Sister   . Diabetes Brother   . Diabetes Other   . Diabetes Daughter        TYPE ll  . Heart Problems Daughter   . Hypertension Sister   . Hypertension Brother   . Stroke Brother     SOCIAL HISTORY: Social History   Socioeconomic History  . Marital status: Married    Spouse name: [name]  . Number of children: 1  . Years of education: 77  . Highest education level: Not on file  Occupational History  . Occupation: retired    Fish farm manager: [employer]  . Financial resource strain: Not on file  . Food insecurity:    Worry: Not on file    Inability: Not on file  . Transportation needs:    Medical: Not on file    Non-medical: Not on file  Tobacco Use  . Smoking status: Former Smoker    Packs/day: 2.00    Years: 14.00    Pack years: 28.00    Types: Cigarettes    Last attempt to quit: 11/11/1972    Years since quitting: 45.5  . Smokeless tobacco: Never Used  Substance and Sexual Activity  . Alcohol use: No    Comment: quit in 1984  . Drug use: No  . Sexual activity: Not Currently  Lifestyle  . Physical activity:    Days per week: 0 days    Minutes per session: 0 min  .  Stress: Not at all  Relationships  . Social connections:    Talks on phone: Not on file    Gets together: Not on file    Attends religious service: Not on file    Active member of club or organization: Not on file    Attends meetings of clubs or organizations: Not on file    Relationship status: Not on file  . Intimate partner violence:    Fear of current or ex partner: Not on file    Emotionally abused: Not on file    Physically abused: Not on file    Forced sexual activity: Not on file  Other Topics Concern  . 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   Patient has one child and his wife has one child.   Patient is retired.   Patient has a high school education.   Patient is right-handed.   Patient drinks very little caffeine.     PHYSICAL EXAM  Vitals:   05/04/18 0820  BP: (!) 92/56  Pulse: 60  Height: 5\' 11"  (1.803 m)   Body mass index is 34.59 kg/m.  Generalized: Well developed, obese male in no acute distress  Head: normocephalic and atraumatic,. Oropharynx benign mallopatti 4 Neck: Supple, circumference  21 Musculoskeletal: No deformity   Neurological examination   Mentation: Alert oriented to time, place, history taking. Attention span and concentration appropriate. Recent and remote memory intact.  Follows all commands speech and language fluent.   Cranial nerve II-XII: .Pupils were equal round reactive to light extraocular movements were full, visual field were full on confrontational test. Facial sensation and strength were normal. hearing was intact to finger rubbing bilaterally. Uvula tongue midline. head turning and shoulder shrug were normal and symmetric.Tongue protrusion into cheek strength was normal. Motor: normal bulk and tone, full strength in the BUE, BLE,  Sensory: normal and symmetric to light touch,  Coordination: finger-nose-finger, heel-to-shin bilaterally, no dysmetria Gait and  Station: In wheelchair not ambulated   DIAGNOSTIC DATA (LABS, IMAGING, TESTING) - I reviewed patient records, labs, notes, testing and imaging myself where available.  Lab Results  Component Value Date   WBC 5.9 04/06/2018   HGB 12.9 (L) 04/09/2018   HCT 38.0 (L) 04/09/2018   MCV 90.8 04/06/2018   PLT 177 04/06/2018      Component Value Date/Time   NA 140 04/09/2018 1028   NA 139 12/24/2017 1448   K 4.2 04/09/2018 1028   CL 103 04/09/2018 1028   CO2 26 04/06/2018 0818   GLUCOSE 137 (H) 04/09/2018 1028   BUN 25 (H) 04/09/2018 1028   BUN 20 12/24/2017 1448   CREATININE 1.50 (H) 04/09/2018 1028   CREATININE 1.75 (H) 10/13/2017 1224   CALCIUM 9.4 04/06/2018 0818   PROT 7.1 04/03/2018 1945   ALBUMIN 3.4 (L) 04/03/2018 1945   AST 18 04/03/2018 1945   ALT 19 04/03/2018 1945   ALKPHOS 47 04/03/2018 1945   BILITOT 0.2 (L) 04/03/2018 1945   GFRNONAA 53 (L) 04/06/2018 0818   GFRNONAA 38 (L) 10/13/2017 1224   GFRAA >60 04/06/2018 0818   GFRAA 43 (L) 10/13/2017 1224   Lab Results  Component Value Date   CHOL 155 10/13/2017   HDL 38 (L) 10/13/2017   LDLCALC 93 10/13/2017   TRIG 144 10/13/2017   CHOLHDL 4.1 10/13/2017   Lab Results  Component Value Date   HGBA1C 11.6 (H) 04/17/2018   No results found for: VITAMINB12 Lab Results  Component Value Date   TSH 20.91 (H) 01/12/2018      ASSESSMENT AND PLAN  77 y.o. year old male  has a past medical history of obstructive sleep apnea on CPAP with new machine.  No chip in  machine unable to obtain CPAP data. ESS 4 The patient is a current patient of Dr. Brett Fairy  who is out of the office today . This note is sent to the work in doctor.      Will obtain CPAP data Will make changes if needed after download received F/U in 4 months Dennie Bible, [facility], [facility], APRN   Late entry compliance data dated 04/05/2018-05/04/2018 shows compliance greater than 4 hours at 70%.  Average usage 7 hours 23 minutes set pressure 6 to 15 cm.   EPR level 2 leak 95th percentile 110.5.  AHI 6.9.  ESS 4.  Due to significant leak patient needs mask refit.  Will send order [facility] [address], [place], [place] [address] [phone]

## 2018-05-04 ENCOUNTER — Encounter: Payer: Self-pay | Admitting: Nurse Practitioner

## 2018-05-04 ENCOUNTER — Telehealth: Payer: Self-pay | Admitting: *Deleted

## 2018-05-04 ENCOUNTER — Ambulatory Visit (INDEPENDENT_AMBULATORY_CARE_PROVIDER_SITE_OTHER): Payer: Medicare Other | Admitting: Nurse Practitioner

## 2018-05-04 ENCOUNTER — Ambulatory Visit (HOSPITAL_COMMUNITY): Payer: Medicare Other

## 2018-05-04 DIAGNOSIS — Z9989 Dependence on other enabling machines and devices: Secondary | ICD-10-CM

## 2018-05-04 DIAGNOSIS — I6523 Occlusion and stenosis of bilateral carotid arteries: Secondary | ICD-10-CM | POA: Diagnosis not present

## 2018-05-04 DIAGNOSIS — G4733 Obstructive sleep apnea (adult) (pediatric): Secondary | ICD-10-CM | POA: Insufficient documentation

## 2018-05-04 NOTE — Telephone Encounter (Signed)
Received fax and CM/NP reviewed. Fax confirmation received to   [phone].

## 2018-05-04 NOTE — Patient Instructions (Signed)
Will obtain CPAP data Will make changes if needed after download received F/U in 4 months

## 2018-05-04 NOTE — Telephone Encounter (Signed)
Spoke to [place], at [facility] Patient [phone] and relayed that pt needs to be tagged to Dr. Brett Fairy in [place] for wireless reports.  She will relay message to them.  I asked for it to be done today.   He has new machine since 02/2018 and should be wireless capable.

## 2018-05-04 NOTE — Telephone Encounter (Signed)
I spoke with pt's wife and reviewed carotid doppler results with her.

## 2018-05-04 NOTE — Telephone Encounter (Signed)
New Message: ° ° ° ° ° ° °Pt is returning a call for results. °

## 2018-05-04 NOTE — Telephone Encounter (Signed)
Left message to call back  

## 2018-05-04 NOTE — Telephone Encounter (Signed)
-----   Message from Burnell Blanks, MD sent at 04/30/2018 10:46 AM EDT ----- Mild carotid disease. Can we let him know? Thanks ,chris

## 2018-05-05 ENCOUNTER — Telehealth: Payer: Self-pay | Admitting: *Deleted

## 2018-05-05 ENCOUNTER — Telehealth: Payer: Self-pay

## 2018-05-05 DIAGNOSIS — B3742 Candidal balanitis: Secondary | ICD-10-CM

## 2018-05-05 NOTE — Telephone Encounter (Signed)
-----   Message from Dennie Bible, NP sent at 05/04/2018  1:08 PM EDT ----- Please let Mr Mackins know he needs mask refit due to significant leak.

## 2018-05-05 NOTE — Telephone Encounter (Signed)
Spoke to pts wife (ok per DPR) and relayed that received download for cpap and information showed a significant leak and will need mask refitting.  I relayed that order already placed (faxed to [facility]).  She is to f/u on this with them.  Will keep appt in October to make sure he is doing well.  She verbalized understanding.

## 2018-05-05 NOTE — Telephone Encounter (Signed)
Patient wife called and states the medication that was prescribed at office visit  clotrimazole is not working it has helped a little but not a lot. [name] is requesting another rx sent in for patient

## 2018-05-06 ENCOUNTER — Ambulatory Visit (HOSPITAL_COMMUNITY): Payer: Medicare Other

## 2018-05-06 ENCOUNTER — Ambulatory Visit (INDEPENDENT_AMBULATORY_CARE_PROVIDER_SITE_OTHER): Payer: Medicare Other | Admitting: Family

## 2018-05-06 ENCOUNTER — Encounter (INDEPENDENT_AMBULATORY_CARE_PROVIDER_SITE_OTHER): Payer: Self-pay | Admitting: Family

## 2018-05-06 VITALS — Ht 71.0 in | Wt 248.0 lb

## 2018-05-06 DIAGNOSIS — I70262 Atherosclerosis of native arteries of extremities with gangrene, left leg: Secondary | ICD-10-CM

## 2018-05-06 DIAGNOSIS — Z89422 Acquired absence of other left toe(s): Secondary | ICD-10-CM

## 2018-05-06 MED ORDER — FLUCONAZOLE 150 MG PO TABS: ORAL_TABLET | ORAL | 0 refills | Status: DC

## 2018-05-06 MED ORDER — SULFAMETHOXAZOLE-TRIMETHOPRIM 800-160 MG PO TABS: 1.0000 | ORAL_TABLET | Freq: Every day | ORAL | 0 refills | Status: DC

## 2018-05-06 NOTE — Progress Notes (Signed)
Office Visit Note   Patient: Steven Maldonado           Date of Birth: May 13, 1941           MRN: [id number] Visit Date: 05/06/2018              Requested by: [name], [name] [address], [place] [address] PCP: [name], [name]  Chief Complaint  Patient presents with  . Left Foot - Routine Post Op    04/17/18 left foot 3rd ray amputation       HPI: Patient is a 77 year old gentleman with peripheral vascular disease status post angiogram with balloon angioplasty approximately 4 weeks ago.  Patient presents status post third toe amputation with progressive gangrenous changes.  Assessment & Plan: Visit Diagnoses:  1. Atherosclerosis of native artery of left lower extremity with gangrene ([place])   2. Hx of amputation of lesser toe, left (HCC)     Plan: Continue with nitroglycerin patch to be changed daily and Trental to also improve microcirculation and will continue the Bactrim DS.  Continue dressing changes elevation nonweightbearing. Discussed with the progressive gangrenous changes that this is a microcirculatory problem and if does not improve we would need to proceed with additional surgery. To follow closely.  Follow-Up Instructions: Return as scheduled.   Ortho Exam  Patient is alert, oriented, no adenopathy, well-dressed, normal affect, normal respiratory effort. Examination patient has an abrasion of the lateral aspect of his foot from patient resting his foot in the postoperative shoe. Examination patient has a minimal amount of swelling he has a palpable pulse Doppler was used and he has a strong biphasic dorsalis pedis and posterior tibial pulse no signs of any vascular compromise since his recent angioplasty.  Patient does have progressive black gangrenous changes with dehiscence of the surgical incision no drainage today. There is no ascending cellulitis there is no purulence no abscess.  Imaging: No results found. No images are attached to the  encounter.  Labs: Lab Results  Component Value Date   HGBA1C 11.6 (H) 04/17/2018   HGBA1C 8.1 (H) 11/28/2017   HGBA1C 11.2 (H) 10/13/2017   ESRSEDRATE 52 (H) 04/03/2018   CRP 1.3 (H) 04/03/2018   REPTSTATUS 04/09/2018 FINAL 04/03/2018   CULT  04/03/2018    NO GROWTH 5 DAYS Performed at [facility], [place] [address] [place], [place] [address]    [place] 01/02/2015     Lab Results  Component Value Date   ALBUMIN 3.4 (L) 04/03/2018   ALBUMIN 3.8 12/02/2017   ALBUMIN 4.1 05/01/2017    Body mass index is 34.59 kg/m.  Orders:  No orders of the defined types were placed in this encounter.  No orders of the defined types were placed in this encounter.    Procedures: No procedures performed  Clinical Data: No additional findings.  ROS:  All other systems negative, except as noted in the HPI. Review of Systems  Constitutional: Negative for chills and fever.  Cardiovascular: Negative for leg swelling.  Skin: Positive for wound. Negative for color change.    Objective: Vital Signs: Ht 5\' 11"  (1.803 m)   Wt 248 lb (112.5 kg)   BMI 34.59 kg/m   Specialty Comments:  No specialty comments available.  PMFS History: Patient Active Problem List   Diagnosis Date Noted  . Obstructive sleep apnea treated with continuous positive airway pressure (CPAP) 05/04/2018  . Diabetes mellitus type 2, uncontrolled ([place]) 04/22/2018  .  Hx of amputation of lesser toe, left ([place]) 04/22/2018  . Gangrene of toe of left foot ([place])   . Atherosclerosis of native arteries of the extremities with gangrene ([place]) 04/09/2018  . Cellulitis in diabetic foot ([place]) 04/04/2018  . Injury of left toe 04/03/2018  . PAF (paroxysmal atrial fibrillation) ([place]) 12/05/2017  . Pacemaker   . Symptomatic bradycardia   . Asystole ([place])   . 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 Severe aortic stenosis   . Essential hypertension 11/14/2015  . PAD (peripheral  artery disease) ([place]) 05/24/2014  . Diabetes ([place]) 10/06/2013  . Chronic kidney disease 08/30/2013  . Hypothyroidism 08/30/2013  . Obesity (BMI 30-39.9) 07/15/2013  . OSA on CPAP 07/15/2013  . Diabetes mellitus type 2, uncomplicated ([place])   . Hyperlipidemia 01/10/2011  . CAD (coronary artery disease) 01/10/2011   Past Medical History:  Diagnosis Date  . Chronic diastolic CHF (congestive heart failure) ([place])   . CKD (chronic kidney disease), stage III ([place])   . Coronary artery disease    a. Cath February 2012 in [facility], occluded RCA with collaterals  . DM type 2 (diabetes mellitus, type 2) ([place])   . Essential hypertension   . Hyperlipidemia   . Pacemaker    a. symptomatic brady after TAVR s/p MDT PPM by Dr. Curt Bears 12/04/17  . Persistent atrial fibrillation ([place])   . PONV (postoperative nausea and vomiting)    after valve surgery  . PVD (peripheral vascular disease) ([place])    a. s/p R popliteal artery stenosis tx with drug-coated balloon 05/2014, followed by Dr. Fletcher Anon.  . 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 Severe aortic stenosis    a. 12/02/17: s/p TAVR  . Skin cancer   . Sleep apnea with use of continuous positive airway pressure (CPAP)    04-11-11 AHI was 32.9 and titrated to 15 cm H20, DME is [facility]  . Subclinical hypothyroidism     Family History  Problem Relation Age of Onset  . Diabetes Mother   . Heart attack Mother   . Hypertension Mother   . Heart attack Father   . Heart failure Father   . Hypertension Father   . Diabetes Father   . Diabetes Sister   . Diabetes Brother   . Diabetes Other   . Diabetes Daughter        TYPE ll  . Heart Problems Daughter   . Hypertension Sister   . Hypertension Brother   . Stroke Brother     Past Surgical History:  Procedure Laterality Date  . ABDOMINAL ANGIOGRAM N/A 06/08/2014   Procedure:  ABDOMINAL ANGIOGRAM;  Surgeon: Wellington Hampshire, MD;  Location: [facility] CATH LAB;  Service: Cardiovascular;  Laterality: N/A;  . ABDOMINAL AORTOGRAM N/A 04/09/2018   Procedure: ABDOMINAL AORTOGRAM;  Surgeon: Conrad Calloway, MD;  Location: [facility] CV LAB;  Service: Cardiovascular;  Laterality: N/A;  . AMPUTATION Left 04/17/2018   Procedure: LEFT FOOT 3RD RAY AMPUTATION;  Surgeon: Newt Minion, MD;  Location: [facility];  Service: Orthopedics;  Laterality: Left;  . APPENDECTOMY  1965  . CARDIAC CATHETERIZATION  12/2010  . CARDIOVERSION  07/2011  . CARDIOVERSION N/A 04/18/2014   Procedure: CARDIOVERSION;  Surgeon: Dorothy Spark, MD;  Location: [facility] ENDOSCOPY;  Service: Cardiovascular;  Laterality: N/A;  . CARDIOVERSION N/A 11/03/2015   Procedure: CARDIOVERSION;  Surgeon: Denice Bors  Stanford Breed, MD;  Location: [place];  Service: Cardiovascular;  Laterality: N/A;  . CARDIOVERSION N/A 05/08/2017   Procedure: CARDIOVERSION;  Surgeon: Dorothy Spark, MD;  Location: [facility] ENDOSCOPY;  Service: Cardiovascular;  Laterality: N/A;  . CARDIOVERSION N/A 07/28/2017   Procedure: CARDIOVERSION;  Surgeon: Dorothy Spark, MD;  Location: [place];  Service: Cardiovascular;  Laterality: N/A;  . LOWER EXTREMITY ANGIOGRAM N/A 06/08/2014   Procedure: LOWER EXTREMITY ANGIOGRAM;  Surgeon: Wellington Hampshire, MD;  Location: [facility] CATH LAB;  Service: Cardiovascular;  Laterality: N/A;  . LOWER EXTREMITY ANGIOGRAPHY Left 04/09/2018   Procedure: Lower Extremity Angiography;  Surgeon: Conrad Waldenburg, MD;  Location: [place] CV LAB;  Service: Cardiovascular;  Laterality: Left;  . PACEMAKER IMPLANT N/A 12/04/2017   Procedure: PACEMAKER IMPLANT;  Surgeon: Constance Haw, MD;  Location: [place] CV LAB;  Service: Cardiovascular;  Laterality: N/A;  . PERIPHERAL VASCULAR BALLOON ANGIOPLASTY Left 04/09/2018   Procedure: PERIPHERAL VASCULAR BALLOON ANGIOPLASTY;  Surgeon: Conrad McCracken, MD;  Location: [place] CV LAB;  Service:  Cardiovascular;  Laterality: Left;  SFA  . POPLITEAL ARTERY ANGIOPLASTY Right 06/08/2014   Archie Endo 06/08/2014  . RIGHT/LEFT HEART CATH AND CORONARY ANGIOGRAPHY N/A 10/08/2017   Procedure: RIGHT/LEFT HEART CATH AND CORONARY ANGIOGRAPHY;  Surgeon: Burnell Blanks, MD;  Location: [place] CV LAB;  Service: Cardiovascular;  Laterality: N/A;  . SKIN CANCER EXCISION Bilateral    "have had them cut off back of neck X 2; off left upper arm; right wrist, near right shoulder blade" (06/08/2014)  . TEE WITHOUT CARDIOVERSION N/A 12/02/2017   Procedure: TRANSESOPHAGEAL ECHOCARDIOGRAM (TEE);  Surgeon: Burnell Blanks, MD;  Location: [facility];  Service: Open Heart Surgery;  Laterality: N/A;  . TEMPORARY PACEMAKER N/A 12/04/2017   Procedure: TEMPORARY PACEMAKER;  Surgeon: Leonie Man, MD;  Location: [place] CV LAB;  Service: Cardiovascular;  Laterality: N/A;  . TRANSCATHETER AORTIC VALVE REPLACEMENT, TRANSFEMORAL N/A 12/02/2017   Procedure: TRANSCATHETER AORTIC VALVE REPLACEMENT, TRANSFEMORAL;  Surgeon: Burnell Blanks, MD;  Location: [facility];  Service: Open Heart Surgery;  Laterality: N/A;  using Edwards Sapien 3 Transcatheter Heart Valve size 78mm   Social History   Occupational History  . Occupation: retired    Fish farm manager: [employer]  Tobacco Use  . Smoking status: Former Smoker    Packs/day: 2.00    Years: 14.00    Pack years: 28.00    Types: Cigarettes    Last attempt to quit: 11/11/1972    Years since quitting: 45.5  . Smokeless tobacco: Never Used  Substance and Sexual Activity  . Alcohol use: No    Comment: quit in 1984  . Drug use: No  . Sexual activity: Not Currently

## 2018-05-06 NOTE — Telephone Encounter (Signed)
Call placed to patient spoke with patient wife [name] and she is aware that a referral will be put in as well as another round of the diflucan sent to pharmacy

## 2018-05-06 NOTE — Telephone Encounter (Signed)
Recommend that he see a urologist. Place referral to urology. In the interim can try taking another oral Diflucan 150 mg 1 p.o QOD (every other day) for 3 doses.   #3+0.

## 2018-05-07 ENCOUNTER — Encounter: Payer: Self-pay | Admitting: Cardiovascular Disease

## 2018-05-07 ENCOUNTER — Ambulatory Visit (INDEPENDENT_AMBULATORY_CARE_PROVIDER_SITE_OTHER): Payer: Medicare Other | Admitting: Cardiovascular Disease

## 2018-05-07 ENCOUNTER — Ambulatory Visit: Payer: Medicare Other | Admitting: Cardiovascular Disease

## 2018-05-07 VITALS — BP 100/60 | HR 64 | Ht 71.0 in | Wt 254.0 lb

## 2018-05-07 DIAGNOSIS — Z952 Presence of prosthetic heart valve: Secondary | ICD-10-CM | POA: Diagnosis not present

## 2018-05-07 DIAGNOSIS — I480 Paroxysmal atrial fibrillation: Secondary | ICD-10-CM | POA: Diagnosis not present

## 2018-05-07 DIAGNOSIS — I739 Peripheral vascular disease, unspecified: Secondary | ICD-10-CM

## 2018-05-07 DIAGNOSIS — I350 Nonrheumatic aortic (valve) stenosis: Secondary | ICD-10-CM | POA: Diagnosis not present

## 2018-05-07 DIAGNOSIS — I2510 Atherosclerotic heart disease of native coronary artery without angina pectoris: Secondary | ICD-10-CM | POA: Diagnosis not present

## 2018-05-07 DIAGNOSIS — I10 Essential (primary) hypertension: Secondary | ICD-10-CM | POA: Diagnosis not present

## 2018-05-07 DIAGNOSIS — I6523 Occlusion and stenosis of bilateral carotid arteries: Secondary | ICD-10-CM | POA: Diagnosis not present

## 2018-05-07 DIAGNOSIS — E785 Hyperlipidemia, unspecified: Secondary | ICD-10-CM | POA: Diagnosis not present

## 2018-05-07 NOTE — Patient Instructions (Addendum)
Medication Instructions:  Your physician recommends that you continue on your current medications as directed. Please refer to the Current Medication list given to you today.   Labwork: none  Testing/Procedures: none  Follow-Up:  Your physician recommends that you schedule a follow-up appointment in: January 2020 with K. Grandville Silos, [place].  We will call you to schedule this appointment    Any Other Special Instructions Will Be Listed Below (If Applicable).     If you need a refill on your cardiac medications before your next appointment, please call your pharmacy.

## 2018-05-07 NOTE — Progress Notes (Signed)
Chief Complaint  Patient presents with  . Follow-up    CAD    History of Present Illness: 77 yo male with history of severe aortic stenosis s/p TAVR, paroxysmal atrial fibrillation, uncontrolled DM, HTN, hyperlipidemia, PAD, complete heart block s/p PPM and CAD here for cardiac follow up. He had an acute MI in 2012 and was taken to [facility] where cath showed an occluded RCA which filled from collaterals and was managed medically. He was also in atrial fib then.  He was seen in the office 11/02/15 for recurrence of atrial fibrillation. This was the first known sustained episode since 2012. He reported med compliance and subsequently underwent DCCV to NSR the following day. He was in atrial fibrillation when seen by Dr. Fletcher Anon in the [facility] clinic May 2018. Lopressor was increased. He was cardioverted to sinus June 2018 but returned quickly to atrial fibrillation. He was loaded with amiodarone in August 2018 and then was seen in September 2018 by Dr. Rayann Heman. Repeat DCCV on 07/28/17 and converted to sinus. He has been seen in the atrial fib clinic in October 2018 and was in sinus. He has been anti-coagulated on Pradaxa. He has been followed for severe AS and underwent TAVR on 12/02/17 with placement of a 29 mm Edwards Sapien 3 valve from the right transfemoral approach. Post op echo with normal LV function with normally functioning AVR and no AI. He had complete heart block post TAVR and had a permanent pacemaker implanted. Cardiac cath November 2018 with 60% proximal LAD stenosis and known occluded distal RCA.   His PAD had been followed by Dr. Fletcher Anon. 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 He was admitted to [facility] May 2019 with a gangrenous left third toe. He underwent angiography per DR. Chen on 04/09/18 with stenting of the left SFA but he could not open the left popliteal artery.  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 Sharol Given.    He is  here today for follow up. The patient denies any chest pain, dyspnea, palpitations, lower extremity edema, orthopnea, PND, dizziness, near syncope or syncope.    Primary Care Physician: Rennis Golden   Past Medical History:  Diagnosis Date  . Chronic diastolic CHF (congestive heart failure) ([place])   . CKD (chronic kidney disease), stage III ([place])   . Coronary artery disease    a. Cath February 2012 in [facility], occluded RCA with collaterals  . DM type 2 (diabetes mellitus, type 2) ([place])   . Essential hypertension   . Hyperlipidemia   . Pacemaker    a. symptomatic brady after TAVR s/p MDT PPM by Dr. Curt Bears 12/04/17  . Persistent atrial fibrillation ([place])   . PONV (postoperative nausea and vomiting)    after valve surgery  . PVD (peripheral vascular disease) ([place])    a. s/p R popliteal artery stenosis tx with drug-coated balloon 05/2014, followed by Dr. Fletcher Anon.  . 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 Severe aortic stenosis    a. 12/02/17: s/p TAVR  . Skin cancer   . Sleep apnea with use of continuous positive airway pressure (CPAP)    04-11-11 AHI was 32.9 and titrated to 15 cm H20, DME is [facility]  . Subclinical hypothyroidism     Past Surgical History:  Procedure Laterality Date  . ABDOMINAL  ANGIOGRAM N/A 06/08/2014   Procedure: ABDOMINAL ANGIOGRAM;  Surgeon: Wellington Hampshire, MD;  Location: [facility] CATH LAB;  Service: Cardiovascular;  Laterality: N/A;  . ABDOMINAL AORTOGRAM N/A 04/09/2018   Procedure: ABDOMINAL AORTOGRAM;  Surgeon: Conrad Montrose, MD;  Location: [place] CV LAB;  Service: Cardiovascular;  Laterality: N/A;  . AMPUTATION Left 04/17/2018   Procedure: LEFT FOOT 3RD RAY AMPUTATION;  Surgeon: Newt Minion, MD;  Location: [place];  Service: Orthopedics;  Laterality: Left;  . APPENDECTOMY  1965  . CARDIAC CATHETERIZATION  12/2010  . CARDIOVERSION   07/2011  . CARDIOVERSION N/A 04/18/2014   Procedure: CARDIOVERSION;  Surgeon: Dorothy Spark, MD;  Location: [place];  Service: Cardiovascular;  Laterality: N/A;  . CARDIOVERSION N/A 11/03/2015   Procedure: CARDIOVERSION;  Surgeon: Lelon Perla, MD;  Location: [facility] ENDOSCOPY;  Service: Cardiovascular;  Laterality: N/A;  . CARDIOVERSION N/A 05/08/2017   Procedure: CARDIOVERSION;  Surgeon: Dorothy Spark, MD;  Location: [facility] ENDOSCOPY;  Service: Cardiovascular;  Laterality: N/A;  . CARDIOVERSION N/A 07/28/2017   Procedure: CARDIOVERSION;  Surgeon: Dorothy Spark, MD;  Location: [place];  Service: Cardiovascular;  Laterality: N/A;  . LOWER EXTREMITY ANGIOGRAM N/A 06/08/2014   Procedure: LOWER EXTREMITY ANGIOGRAM;  Surgeon: Wellington Hampshire, MD;  Location: [facility] CATH LAB;  Service: Cardiovascular;  Laterality: N/A;  . LOWER EXTREMITY ANGIOGRAPHY Left 04/09/2018   Procedure: Lower Extremity Angiography;  Surgeon: Conrad Campo, MD;  Location: [place] CV LAB;  Service: Cardiovascular;  Laterality: Left;  . PACEMAKER IMPLANT N/A 12/04/2017   Procedure: PACEMAKER IMPLANT;  Surgeon: Constance Haw, MD;  Location: [place] CV LAB;  Service: Cardiovascular;  Laterality: N/A;  . PERIPHERAL VASCULAR BALLOON ANGIOPLASTY Left 04/09/2018   Procedure: PERIPHERAL VASCULAR BALLOON ANGIOPLASTY;  Surgeon: Conrad Belmont, MD;  Location: [place] CV LAB;  Service: Cardiovascular;  Laterality: Left;  SFA  . POPLITEAL ARTERY ANGIOPLASTY Right 06/08/2014   Archie Endo 06/08/2014  . RIGHT/LEFT HEART CATH AND CORONARY ANGIOGRAPHY N/A 10/08/2017   Procedure: RIGHT/LEFT HEART CATH AND CORONARY ANGIOGRAPHY;  Surgeon: Burnell Blanks, MD;  Location: [place] CV LAB;  Service: Cardiovascular;  Laterality: N/A;  . SKIN CANCER EXCISION Bilateral    "have had them cut off back of neck X 2; off left upper arm; right wrist, near right shoulder blade" (06/08/2014)  . TEE WITHOUT CARDIOVERSION N/A  12/02/2017   Procedure: TRANSESOPHAGEAL ECHOCARDIOGRAM (TEE);  Surgeon: Burnell Blanks, MD;  Location: [place];  Service: Open Heart Surgery;  Laterality: N/A;  . TEMPORARY PACEMAKER N/A 12/04/2017   Procedure: TEMPORARY PACEMAKER;  Surgeon: Leonie Man, MD;  Location: [place] CV LAB;  Service: Cardiovascular;  Laterality: N/A;  . TRANSCATHETER AORTIC VALVE REPLACEMENT, TRANSFEMORAL N/A 12/02/2017   Procedure: TRANSCATHETER AORTIC VALVE REPLACEMENT, TRANSFEMORAL;  Surgeon: Burnell Blanks, MD;  Location: [place];  Service: Open Heart Surgery;  Laterality: N/A;  using Edwards Sapien 3 Transcatheter Heart Valve size 50mm    Current Outpatient Medications  Medication Sig Dispense Refill  . amiodarone (PACERONE) 200 MG tablet Take 1 tablet (200 mg total) by mouth daily. 90 tablet 3  . aspirin EC 81 MG tablet Take 81 mg by mouth daily after breakfast.     . atorvastatin (LIPITOR) 80 MG tablet Take 80 mg by mouth at bedtime.    . Blood Glucose Monitoring Suppl (FREESTYLE LITE) DEVI 1 each by Does not apply route 2 (two) times daily. 1 each 0  . CINNAMON PO  Take 3,000 mg by mouth daily. 1,000mg  per capsule    . clotrimazole (LOTRIMIN) 1 % cream Apply 1 application topically 2 (two) times daily. 30 g 0  . dabigatran (PRADAXA) 150 MG CAPS capsule Take 150 mg by mouth every 12 (twelve) hours.     . docusate sodium (COLACE) 100 MG capsule Take 100 mg by mouth daily after breakfast.     . fluconazole (DIFLUCAN) 150 MG tablet Take 1 tablet  By mouth every other day for a total of 3 days 3 tablet 0  . furosemide (LASIX) 20 MG tablet Take 1 tablet (20 mg total) by mouth daily. 90 tablet 3  . gabapentin (NEURONTIN) 300 MG capsule Take 1 capsule (300 mg total) by mouth 3 (three) times daily. 90 capsule 5  . glipiZIDE (GLUCOTROL XL) 10 MG 24 hr tablet TAKE 1 TABLET (10 MG TOTAL) BY MOUTH DAILY WITH BREAKFAST. 30 tablet 5  . glucose blood (FREESTYLE LITE) test strip 1 each by Other route 4  (four) times daily -  before meals and at bedtime. 450 each 3  . HUMALOG KWIKPEN 100 UNIT/ML KiwkPen INJECT 5 UNITS             SUBCUTANEOUSLY 3 TIMES A   DAY 15 mL 3  . HYDROcodone-acetaminophen (NORCO/VICODIN) 5-325 MG tablet Take 1 tablet by mouth every 4 (four) hours as needed for moderate pain. 30 tablet 0  . Insulin Glargine (BASAGLAR KWIKPEN) 100 UNIT/ML SOPN Inject 0.65 mLs (65 Units total) into the skin at bedtime. 30 mL 0  . insulin lispro (HUMALOG) 100 UNIT/ML KiwkPen Inject 0.05 mLs (5 Units total) into the skin 3 (three) times daily. 15 mL 3  . levothyroxine (SYNTHROID, LEVOTHROID) 150 MCG tablet TAKE 1 TABLET (150 MCG TOTAL) BY MOUTH DAILY. TAKE FOR 3 WEEKS THEN FOLLOW UP WITH PROVIDER 21 tablet 0  . magnesium oxide (MAG-OX) 400 MG tablet Take 400 mg by mouth daily after breakfast.     . metoprolol tartrate (LOPRESSOR) 50 MG tablet Take 75 mg by mouth 2 (two) times daily.    [name] Kitchen MYRBETRIQ 25 MG TB24 tablet TAKE 1 TABLET BY MOUTH EVERY DAY 90 tablet 0  . nitroGLYCERIN (NITRODUR - DOSED IN MG/24 HR) 0.2 mg/hr patch Place 1 patch (0.2 mg total) onto the skin daily. 30 patch 12  . pentoxifylline (TRENTAL) 400 MG CR tablet Take 1 tablet (400 mg total) by mouth 3 (three) times daily with meals. 90 tablet 3  . PRADAXA 150 MG CAPS capsule TAKE 1 CAPSULE EVERY 12    HOURS 180 capsule 3  . sulfamethoxazole-trimethoprim (BACTRIM DS,SEPTRA DS) 800-160 MG tablet Take 1 tablet by mouth daily. 28 tablet 0   No current facility-administered medications for this visit.     No Known Allergies  Social History   Socioeconomic History  . Marital status: Married    Spouse name: [name]  . Number of children: 1  . Years of education: 48  . Highest education level: Not on file  Occupational History  . Occupation: retired    Fish farm manager: [employer]  . Financial resource strain: Not on file  . Food insecurity:    Worry: Not on file    Inability: Not on file  . Transportation  needs:    Medical: Not on file    Non-medical: Not on file  Tobacco Use  . Smoking status: Former Smoker    Packs/day: 2.00    Years: 14.00    Pack years: 28.00  Types: Cigarettes    Last attempt to quit: 11/11/1972    Years since quitting: 45.5  . Smokeless tobacco: Never Used  Substance and Sexual Activity  . Alcohol use: No    Comment: quit in 1984  . Drug use: No  . Sexual activity: Not Currently  Lifestyle  . Physical activity:    Days per week: 0 days    Minutes per session: 0 min  . Stress: Not at all  Relationships  . Social connections:    Talks on phone: Not on file    Gets together: Not on file    Attends religious service: Not on file    Active member of club or organization: Not on file    Attends meetings of clubs or organizations: Not on file    Relationship status: Not on file  . Intimate partner violence:    Fear of current or ex partner: Not on file    Emotionally abused: Not on file    Physically abused: Not on file    Forced sexual activity: Not on file  Other Topics Concern  . 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   Patient has one child and his wife has one child.   Patient is retired.   Patient has a high school education.   Patient is right-handed.   Patient drinks very little caffeine.    Family History  Problem Relation Age of Onset  . Diabetes Mother   . Heart attack Mother   . Hypertension Mother   . Heart attack Father   . Heart failure Father   . Hypertension Father   . Diabetes Father   . Diabetes Sister   . Diabetes Brother   . Diabetes Other   . Diabetes Daughter        TYPE ll  . Heart Problems Daughter   . Hypertension Sister   . Hypertension Brother   . Stroke Brother     Review of Systems:  As stated in the HPI and otherwise negative.   BP 100/60   Pulse 64   Ht 5\' 11"  (1.803 m)   Wt 254 lb (115.2 kg) Comment: pt stated weight  SpO2 95%   BMI  35.43 kg/m   Physical Examination:  General: Well developed, well nourished, NAD  HEENT: OP clear, mucus membranes moist  SKIN: warm, dry. No rashes. Neuro: No focal deficits  Musculoskeletal: Muscle strength 5/5 all ext  Psychiatric: Mood and affect normal  Neck: No JVD, no carotid bruits, no thyromegaly, no lymphadenopathy.  Lungs:Clear bilaterally, no wheezes, rhonci, crackles Cardiovascular: Regular rate and rhythm. No murmurs, gallops or rubs. Abdomen:Soft. Bowel sounds present. Non-tender.  Extremities: No lower extremity edema. Pulses are 2 + in the bilateral DP/PT.  Echo 12/24/17: - Left ventricle: Inferobasal hypokinesis The cavity size was   normal. Wall thickness was increased in a pattern of severe LVH.   Systolic function was normal. The estimated ejection fraction was   in the range of 55% to 60%. - Aortic valve: Post TAVR with no significant peri valvualr   regurgitation gradients have increased since 12/03/17. - Mitral valve: Severely calcified annulus. Moderately thickened,   moderately calcified leaflets . There was mild regurgitation. - Left atrium: The atrium was moderately dilated. - Atrial septum: No defect or patent foramen ovale was identified.  EKG:  EKG is  not  ordered today. The ekg ordered today demonstrates  Recent Labs: 11/28/2017: B Natriuretic Peptide 47.9 12/03/2017: Magnesium 1.9 12/11/2017: NT-Pro BNP 271 01/12/2018: TSH 20.91 04/03/2018: ALT 19 04/06/2018: Platelets 177 04/09/2018: BUN 25; Creatinine, Ser 1.50; Hemoglobin 12.9; Potassium 4.2; Sodium 140   Lipid Panel    Component Value Date/Time   CHOL 155 10/13/2017 1224   TRIG 144 10/13/2017 1224   HDL 38 (L) 10/13/2017 1224   CHOLHDL 4.1 10/13/2017 1224   VLDL 32 (H) 05/01/2017 1216   LDLCALC 93 10/13/2017 1224     Wt Readings from Last 3 Encounters:  05/07/18 254 lb (115.2 kg)  05/06/18 248 lb (112.5 kg)  04/17/18 248 lb (112.5 kg)     Other studies Reviewed: Additional  studies/ records that were reviewed today include: . Review of the above records demonstrates:    Assessment and Plan:   1. CAD without angina: No chest pain. Will continue ASA, statin and beta blocker.      2. Severe Aortic stenosis: He is s/p TAVR January 2019. He is doing well. Echo one month post implant with normally functioning bioprosthetic AVR.   3. ATRIAL FIBRILLATION, paroxysmal: sinus. Will continue Pradaxa and beta blocker.     4. HTN: BP is controlled. No changes  5. HLD: LDL is at goal. Continue statin.   6. PAD: Followed now in [facility]. His toe amputation is healing slowly. He will keep f/u with Dr. Sharol Given and Dr. Bridgett Larsson.   7. Carotid artery disease: Mild to moderate bilateral ICA stenosis by doppler June 2019.   Current medicines are reviewed at length with the patient today.  The patient does not have concerns regarding medicines.  The following changes have been made:  no change  Labs/ tests ordered today include:   No orders of the defined types were placed in this encounter.   Disposition:   FU with me in 6 months.   Signed, [name], MD 05/07/2018 2:47 PM    [facility] [address], [place], [place]  [address] Phone: [phone]; Fax: [phone]

## 2018-05-07 NOTE — Progress Notes (Deleted)
No chief complaint on file.   History of Present Illness: 77 yo male with history of severe aortic stenosis s/p TAVR, paroxysmal atrial fibrillation, uncontrolled DM, HTN, hyperlipidemia, PAD, chronic diastolic chf and CAD here for cardiac follow up. He had an MI in 2012 at [facility] and his RCA was occluded. This filled from collaterals and was treated medically. He was in atrial fib during that admission and was cardioverted to sinus. He was followed for years for aortic stenosis which progressed in 2018. Cardiac cath November 2018 with moderate proximal LAD stenosis, chronically occluded RCA. He underwent TAVR in January 2019 with placement of a 29 mm Edwards Sapien 3 valve from the right transfemoral approach. He developed significant bradycardia with complete heart block following the procedure with syncope and a permanent pacemaker was implanted. He has paroxysmal atrial fibrillation and had repeat cardioversion after amiodarone loading in the spring of 2018. He was seen in the atrial fib clinic by DR. Allred. He has been anti-coagulated on Pradaxa.  When he was seen in our office in January 2019, he was volume overloaded and lasix was increased. Echo February 2019 with normal LV systolic function, severe LVH, normally functioning bioprosthetic AVR, mild MR.   His PAD is followed by Dr. Fletcher Anon. 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 He was admitted to [facility] May 2019 with left third toe gangrene and was seen by [facility]. Dr. Bridgett Larsson performed angioplasty and stenting of the left superficial femoral artery and had a failed attempt at PTA of the left popliteal artery. HIs left third toe was amputated. Mild carotid disease by dopplers June 2019.   He is here today for follow up. The patient denies any chest pain, dyspnea, palpitations, lower extremity edema, orthopnea, PND, dizziness, near syncope or syncope.     Primary Care Physician: Rennis Golden   Past Medical History:  Diagnosis Date  . Chronic diastolic CHF (congestive heart failure) ([place])   . CKD (chronic kidney disease), stage III ([place])   . Coronary artery disease    a. Cath February 2012 in [facility], occluded RCA with collaterals  . DM type 2 (diabetes mellitus, type 2) ([place])   . Essential hypertension   . Hyperlipidemia   . Pacemaker    a. symptomatic brady after TAVR s/p MDT PPM by Dr. Curt Bears 12/04/17  . Persistent atrial fibrillation ([place])   . PONV (postoperative nausea and vomiting)    after valve surgery  . PVD (peripheral vascular disease) ([place])    a. s/p R popliteal artery stenosis tx with drug-coated balloon 05/2014, followed by Dr. Fletcher Anon.  . 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 Severe aortic stenosis    a. 12/02/17: s/p TAVR  . Skin cancer   . Sleep apnea with use of continuous positive airway pressure (CPAP)    04-11-11 AHI was 32.9 and titrated to 15 cm H20, DME is [facility]  . Subclinical hypothyroidism     Past Surgical History:  Procedure Laterality Date  . ABDOMINAL ANGIOGRAM N/A 06/08/2014   Procedure: ABDOMINAL ANGIOGRAM;  Surgeon: Wellington Hampshire, MD;  Location: [facility] CATH LAB;  Service: Cardiovascular;  Laterality: N/A;  . ABDOMINAL AORTOGRAM N/A 04/09/2018   Procedure: ABDOMINAL AORTOGRAM;  Surgeon: Conrad Shirley, MD;  Location: [place] CV LAB;  Service: Cardiovascular;  Laterality: N/A;  .  AMPUTATION Left 04/17/2018   Procedure: LEFT FOOT 3RD RAY AMPUTATION;  Surgeon: Newt Minion, MD;  Location: [place];  Service: Orthopedics;  Laterality: Left;  . APPENDECTOMY  1965  . CARDIAC CATHETERIZATION  12/2010  . CARDIOVERSION  07/2011  . CARDIOVERSION N/A 04/18/2014   Procedure: CARDIOVERSION;  Surgeon: Dorothy Spark, MD;  Location: [place];  Service: Cardiovascular;  Laterality: N/A;  . CARDIOVERSION N/A 11/03/2015    Procedure: CARDIOVERSION;  Surgeon: Lelon Perla, MD;  Location: [facility] ENDOSCOPY;  Service: Cardiovascular;  Laterality: N/A;  . CARDIOVERSION N/A 05/08/2017   Procedure: CARDIOVERSION;  Surgeon: Dorothy Spark, MD;  Location: [facility] ENDOSCOPY;  Service: Cardiovascular;  Laterality: N/A;  . CARDIOVERSION N/A 07/28/2017   Procedure: CARDIOVERSION;  Surgeon: Dorothy Spark, MD;  Location: [place];  Service: Cardiovascular;  Laterality: N/A;  . LOWER EXTREMITY ANGIOGRAM N/A 06/08/2014   Procedure: LOWER EXTREMITY ANGIOGRAM;  Surgeon: Wellington Hampshire, MD;  Location: [facility] CATH LAB;  Service: Cardiovascular;  Laterality: N/A;  . LOWER EXTREMITY ANGIOGRAPHY Left 04/09/2018   Procedure: Lower Extremity Angiography;  Surgeon: Conrad [place], MD;  Location: [place] CV LAB;  Service: Cardiovascular;  Laterality: Left;  . PACEMAKER IMPLANT N/A 12/04/2017   Procedure: PACEMAKER IMPLANT;  Surgeon: Constance Haw, MD;  Location: [place] CV LAB;  Service: Cardiovascular;  Laterality: N/A;  . PERIPHERAL VASCULAR BALLOON ANGIOPLASTY Left 04/09/2018   Procedure: PERIPHERAL VASCULAR BALLOON ANGIOPLASTY;  Surgeon: Conrad Salley, MD;  Location: [place] CV LAB;  Service: Cardiovascular;  Laterality: Left;  SFA  . POPLITEAL ARTERY ANGIOPLASTY Right 06/08/2014   Archie Endo 06/08/2014  . RIGHT/LEFT HEART CATH AND CORONARY ANGIOGRAPHY N/A 10/08/2017   Procedure: RIGHT/LEFT HEART CATH AND CORONARY ANGIOGRAPHY;  Surgeon: Burnell Blanks, MD;  Location: [place] CV LAB;  Service: Cardiovascular;  Laterality: N/A;  . SKIN CANCER EXCISION Bilateral    "have had them cut off back of neck X 2; off left upper arm; right wrist, near right shoulder blade" (06/08/2014)  . TEE WITHOUT CARDIOVERSION N/A 12/02/2017   Procedure: TRANSESOPHAGEAL ECHOCARDIOGRAM (TEE);  Surgeon: Burnell Blanks, MD;  Location: [place];  Service: Open Heart Surgery;  Laterality: N/A;  . TEMPORARY PACEMAKER N/A 12/04/2017    Procedure: TEMPORARY PACEMAKER;  Surgeon: Leonie Man, MD;  Location: [place] CV LAB;  Service: Cardiovascular;  Laterality: N/A;  . TRANSCATHETER AORTIC VALVE REPLACEMENT, TRANSFEMORAL N/A 12/02/2017   Procedure: TRANSCATHETER AORTIC VALVE REPLACEMENT, TRANSFEMORAL;  Surgeon: Burnell Blanks, MD;  Location: [place];  Service: Open Heart Surgery;  Laterality: N/A;  using Edwards Sapien 3 Transcatheter Heart Valve size 46mm    Current Outpatient Medications  Medication Sig Dispense Refill  . amiodarone (PACERONE) 200 MG tablet Take 1 tablet (200 mg total) by mouth daily. 90 tablet 3  . aspirin EC 81 MG tablet Take 81 mg by mouth daily after breakfast.     . atorvastatin (LIPITOR) 80 MG tablet Take 80 mg by mouth at bedtime.    . Blood Glucose Monitoring Suppl (FREESTYLE LITE) DEVI 1 each by Does not apply route 2 (two) times daily. 1 each 0  . CINNAMON PO Take 3,000 mg by mouth daily. 1,000mg  per capsule    . clotrimazole (LOTRIMIN) 1 % cream Apply 1 application topically 2 (two) times daily. 30 g 0  . dabigatran (PRADAXA) 150 MG CAPS capsule Take 150 mg by mouth every 12 (twelve) hours.     . docusate sodium (COLACE) 100 MG  capsule Take 100 mg by mouth daily after breakfast.     . fluconazole (DIFLUCAN) 150 MG tablet Take 1 tablet  By mouth every other day for a total of 3 days 3 tablet 0  . furosemide (LASIX) 20 MG tablet Take 1 tablet (20 mg total) by mouth daily. 90 tablet 3  . gabapentin (NEURONTIN) 300 MG capsule Take 1 capsule (300 mg total) by mouth 3 (three) times daily. 90 capsule 5  . glipiZIDE (GLUCOTROL XL) 10 MG 24 hr tablet TAKE 1 TABLET (10 MG TOTAL) BY MOUTH DAILY WITH BREAKFAST. 30 tablet 5  . glucose blood (FREESTYLE LITE) test strip 1 each by Other route 4 (four) times daily -  before meals and at bedtime. 450 each 3  . HUMALOG KWIKPEN 100 UNIT/ML KiwkPen INJECT 5 UNITS             SUBCUTANEOUSLY 3 TIMES A   DAY 15 mL 3  . HYDROcodone-acetaminophen  (NORCO/VICODIN) 5-325 MG tablet Take 1 tablet by mouth every 4 (four) hours as needed for moderate pain. 30 tablet 0  . Insulin Glargine (BASAGLAR KWIKPEN) 100 UNIT/ML SOPN Inject 0.65 mLs (65 Units total) into the skin at bedtime. 30 mL 0  . insulin lispro (HUMALOG) 100 UNIT/ML KiwkPen Inject 0.05 mLs (5 Units total) into the skin 3 (three) times daily. 15 mL 3  . levothyroxine (SYNTHROID, LEVOTHROID) 150 MCG tablet TAKE 1 TABLET (150 MCG TOTAL) BY MOUTH DAILY. TAKE FOR 3 WEEKS THEN FOLLOW UP WITH PROVIDER 21 tablet 0  . magnesium oxide (MAG-OX) 400 MG tablet Take 400 mg by mouth daily after breakfast.     . metoprolol tartrate (LOPRESSOR) 50 MG tablet Take 75 mg by mouth 2 (two) times daily.    [name] Kitchen MYRBETRIQ 25 MG TB24 tablet TAKE 1 TABLET BY MOUTH EVERY DAY 90 tablet 0  . nitroGLYCERIN (NITRODUR - DOSED IN MG/24 HR) 0.2 mg/hr patch Place 1 patch (0.2 mg total) onto the skin daily. 30 patch 12  . pentoxifylline (TRENTAL) 400 MG CR tablet Take 1 tablet (400 mg total) by mouth 3 (three) times daily with meals. 90 tablet 3  . PRADAXA 150 MG CAPS capsule TAKE 1 CAPSULE EVERY 12    HOURS 180 capsule 3  . sulfamethoxazole-trimethoprim (BACTRIM DS,SEPTRA DS) 800-160 MG tablet Take 1 tablet by mouth daily. 28 tablet 0   No current facility-administered medications for this visit.     No Known Allergies  Social History   Socioeconomic History  . Marital status: Married    Spouse name: [name]  . Number of children: 1  . Years of education: 34  . Highest education level: Not on file  Occupational History  . Occupation: retired    Fish farm manager: [employer]  . Financial resource strain: Not on file  . Food insecurity:    Worry: Not on file    Inability: Not on file  . Transportation needs:    Medical: Not on file    Non-medical: Not on file  Tobacco Use  . Smoking status: Former Smoker    Packs/day: 2.00    Years: 14.00    Pack years: 28.00    Types: Cigarettes    Last  attempt to quit: 11/11/1972    Years since quitting: 45.5  . Smokeless tobacco: Never Used  Substance and Sexual Activity  . Alcohol use: No    Comment: quit in 1984  . Drug use: No  . Sexual activity: Not Currently  Lifestyle  .  Physical activity:    Days per week: 0 days    Minutes per session: 0 min  . Stress: Not at all  Relationships  . Social connections:    Talks on phone: Not on file    Gets together: Not on file    Attends religious service: Not on file    Active member of club or organization: Not on file    Attends meetings of clubs or organizations: Not on file    Relationship status: Not on file  . Intimate partner violence:    Fear of current or ex partner: Not on file    Emotionally abused: Not on file    Physically abused: Not on file    Forced sexual activity: Not on file  Other Topics Concern  . 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   Patient has one child and his wife has one child.   Patient is retired.   Patient has a high school education.   Patient is right-handed.   Patient drinks very little caffeine.    Family History  Problem Relation Age of Onset  . Diabetes Mother   . Heart attack Mother   . Hypertension Mother   . Heart attack Father   . Heart failure Father   . Hypertension Father   . Diabetes Father   . Diabetes Sister   . Diabetes Brother   . Diabetes Other   . Diabetes Daughter        TYPE ll  . Heart Problems Daughter   . Hypertension Sister   . Hypertension Brother   . Stroke Brother     Review of Systems:  As stated in the HPI and otherwise negative.   There were no vitals taken for this visit.  Physical Examination:  General: Well developed, well nourished, NAD  HEENT: OP clear, mucus membranes moist  SKIN: warm, dry. No rashes. Neuro: No focal deficits  Musculoskeletal: Muscle strength 5/5 all ext  Psychiatric: Mood and affect normal  Neck: No JVD,  no carotid bruits, no thyromegaly, no lymphadenopathy.  Lungs:Clear bilaterally, no wheezes, rhonci, crackles Cardiovascular: Regular rate and rhythm. No murmurs, gallops or rubs. Abdomen:Soft. Bowel sounds present. Non-tender.  Extremities: No lower extremity edema. Pulses are 2 + in the bilateral DP/PT.  Echo February 2019: - Left ventricle: Inferobasal hypokinesis The cavity size was   normal. Wall thickness was increased in a pattern of severe LVH.   Systolic function was normal. The estimated ejection fraction was   in the range of 55% to 60%. - Aortic valve: Post TAVR with no significant peri valvualr   regurgitation gradients have increased since 12/03/17. - Mitral valve: Severely calcified annulus. Moderately thickened,   moderately calcified leaflets . There was mild regurgitation. - Left atrium: The atrium was moderately dilated. - Atrial septum: No defect or patent foramen ovale was identified.  EKG:  EKG is  Not ***   ordered today. The ekg ordered today demonstrates   Recent Labs: 11/28/2017: B Natriuretic Peptide 47.9 12/03/2017: Magnesium 1.9 12/11/2017: NT-Pro BNP 271 01/12/2018: TSH 20.91 04/03/2018: ALT 19 04/06/2018: Platelets 177 04/09/2018: BUN 25; Creatinine, Ser 1.50; Hemoglobin 12.9; Potassium 4.2; Sodium 140   Lipid Panel    Component Value Date/Time   CHOL 155 10/13/2017 1224   TRIG 144 10/13/2017 1224   HDL 38 (L) 10/13/2017 1224   CHOLHDL 4.1 10/13/2017 1224   VLDL 32 (H) 05/01/2017  [place] 93 10/13/2017 1224     Wt Readings from Last 3 Encounters:  05/06/18 248 lb (112.5 kg)  04/17/18 248 lb (112.5 kg)  04/13/18 248 lb (112.5 kg)     Other studies Reviewed: Additional studies/ records that were reviewed today include: . Review of the above records demonstrates:    Assessment and Plan:   1. CAD without angina: NO chest pain. Moderate LAD stenosis by cath in November 2018 with known CTO of the RCA. Will continue ASA, statin and beta  blocker.    2. Severe Aortic stenosis: He is s/p TAVR in January 2019 and the AVR is working well by echo in February 2019. Continue ASA and Pradaxa. SBE prophylaxis when indicated.   3. ATRIAL FIBRILLATION, paroxysmal: Sinus today. Continue amiodarone and Pradaxa. Followed in atrial fib clinic.     4. HTN: BP is controlled. NO changes.   5. HLD: Continue statin.   6. PAD: Stable. Followed in [facility] clinic.   Current medicines are reviewed at length with the patient today.  The patient does not have concerns regarding medicines.  The following changes have been made:  no change  Labs/ tests ordered today include:   No orders of the defined types were placed in this encounter.   Disposition:   FU with the TAVR team   Signed, [name], MD 05/07/2018 6:55 AM    [address], [place], [place]  [address] Phone: [phone]; Fax: [phone]

## 2018-05-08 ENCOUNTER — Ambulatory Visit (HOSPITAL_COMMUNITY): Payer: Medicare Other

## 2018-05-10 ENCOUNTER — Other Ambulatory Visit (INDEPENDENT_AMBULATORY_CARE_PROVIDER_SITE_OTHER): Payer: Self-pay | Admitting: Orthopedic Surgery

## 2018-05-11 ENCOUNTER — Ambulatory Visit (HOSPITAL_COMMUNITY): Payer: Medicare Other

## 2018-05-11 NOTE — Progress Notes (Signed)
I have reviewed and agreed above plan. 

## 2018-05-12 ENCOUNTER — Ambulatory Visit: Payer: Medicare Other | Admitting: Cardiovascular Disease

## 2018-05-12 ENCOUNTER — Ambulatory Visit: Payer: Medicare Other | Admitting: Adult Health

## 2018-05-12 ENCOUNTER — Encounter (INDEPENDENT_AMBULATORY_CARE_PROVIDER_SITE_OTHER): Payer: Self-pay | Admitting: Family

## 2018-05-13 ENCOUNTER — Ambulatory Visit (HOSPITAL_COMMUNITY): Payer: Medicare Other

## 2018-05-13 ENCOUNTER — Telehealth (INDEPENDENT_AMBULATORY_CARE_PROVIDER_SITE_OTHER): Payer: Self-pay

## 2018-05-13 NOTE — Telephone Encounter (Signed)
Patients wife called and states last time patient was here they said they were going to send in an Rx for (Sulfamethoxazole). Does he need to take this Rx or not bc they don't have anything at the pharmacy.   Also Would like a home nurse to come out so she wont have to come in back and fourth. "that's why we have health ins" per patients wife.  CB: Patient's Wife [name] [phone]

## 2018-05-13 NOTE — Telephone Encounter (Signed)
I called [facility] [address] and the Bactrim DS rx that we faxed in last week is still awaiting pick up. I called the pt and sw his wife to advise. Also the dry dressing that is being applied to his foot is not considered a skilled HHN need and that insurance will not cover this. I advised that I would be happy to set this up but it would be an out of pocket expense. [name] declined and stated that she will pick up the rx and keep his appt on Wednesday.

## 2018-05-15 ENCOUNTER — Ambulatory Visit (HOSPITAL_COMMUNITY): Payer: Medicare Other

## 2018-05-18 ENCOUNTER — Ambulatory Visit (HOSPITAL_COMMUNITY): Payer: Medicare Other

## 2018-05-20 ENCOUNTER — Ambulatory Visit (HOSPITAL_COMMUNITY): Payer: Medicare Other

## 2018-05-21 ENCOUNTER — Encounter (INDEPENDENT_AMBULATORY_CARE_PROVIDER_SITE_OTHER): Payer: Self-pay | Admitting: Orthopedic Surgery

## 2018-05-21 ENCOUNTER — Ambulatory Visit (INDEPENDENT_AMBULATORY_CARE_PROVIDER_SITE_OTHER): Payer: Medicare Other | Admitting: Orthopedic Surgery

## 2018-05-21 DIAGNOSIS — E1142 Type 2 diabetes mellitus with diabetic polyneuropathy: Secondary | ICD-10-CM

## 2018-05-21 DIAGNOSIS — Z89422 Acquired absence of other left toe(s): Secondary | ICD-10-CM

## 2018-05-21 DIAGNOSIS — Z6841 Body Mass Index (BMI) 40.0 and over, adult: Secondary | ICD-10-CM

## 2018-05-21 DIAGNOSIS — I70262 Atherosclerosis of native arteries of extremities with gangrene, left leg: Secondary | ICD-10-CM

## 2018-05-21 NOTE — Progress Notes (Signed)
Office Visit Note   Patient: Steven Maldonado           Date of Birth: 1940-12-01           MRN: [id number] Visit Date: 05/21/2018              Requested by: [name], [name] [address], [place] [address] PCP: [name], [name]  Chief Complaint  Patient presents with  . Left Foot - Pain, Routine Post Op, Follow-up      HPI: Patient is a 77 year old gentleman with diabetic insensate neuropathy with progressive gangrenous changes to the left forefoot status post third ray amputation.  Patient is status post angioplasty to the left lower extremity with excellent improvement in his macro circulation.  Patient is currently on antibiotics x2 using a nitroglycerin patch to improve his microcirculation.  Patient complains of increasing black gangrenous changes increasing odor.  He states he does have a follow-up appointment with Dr. Bridgett Larsson next week.  Assessment & Plan: Visit Diagnoses:  1. Atherosclerosis of native artery of left lower extremity with gangrene ([place])   2. Hx of amputation of lesser toe, left ([place])   3. Diabetic polyneuropathy associated with type 2 diabetes mellitus ([place])   4. Body mass index 40.0-44.9, adult ([place])   5. Morbid obesity ([place])     Plan: Discussed with the patient with his progressive gangrenous changes to the left foot despite a strong dorsalis pedis and posterior tibial biphasic pulse I do not feel that further foot salvage intervention would be beneficial due to the patient's severe microcirculatory problem.  He does have good macro circulation to the ankle.  I feel the best option for the patient is a transtibial amputation.  I have recommended that he consult Dr. Bridgett Larsson this next week with formalized vascular studies and Dr. Lianne Moris recommendation.  Discussed that we could proceed with a transtibial amputation Wednesday or Friday the week after their appointment with Dr. Bridgett Larsson.  Follow-Up Instructions: Return in about 2 weeks (around  06/04/2018).   Ortho Exam  Patient is alert, oriented, no adenopathy, well-dressed, normal affect, normal respiratory effort. Examination patient has progressive gangrenous changes to the forefoot.  There is increased odor he has black gangrenous changes now to the fourth and second toe with a large black gangrenous ulcer from the amputation site with progressive dehiscence.  Patient also has a black ischemic ulcer over the fifth metatarsal head as well as a black ischemic ulcer dorsally over the midfoot.  A Doppler was used and patient has a strong biphasic dorsalis pedis and posterior tibial pulse.  He has no ascending cellulitis minimal swelling in his leg.  Patient has uncontrolled type 2 diabetes with a hemoglobin A1c of 11.6  Imaging: No results found. No images are attached to the encounter.  Labs: Lab Results  Component Value Date   HGBA1C 11.6 (H) 04/17/2018   HGBA1C 8.1 (H) 11/28/2017   HGBA1C 11.2 (H) 10/13/2017   ESRSEDRATE 52 (H) 04/03/2018   CRP 1.3 (H) 04/03/2018   REPTSTATUS 04/09/2018 FINAL 04/03/2018   CULT  04/03/2018    NO GROWTH 5 DAYS Performed at [facility], [place] [address] [place], [place] [address]    [place] 01/02/2015     Lab Results  Component Value Date   ALBUMIN 3.4 (L) 04/03/2018   ALBUMIN 3.8 12/02/2017   ALBUMIN 4.1 05/01/2017    There is no height or weight on file to  calculate BMI.  Orders:  No orders of the defined types were placed in this encounter.  No orders of the defined types were placed in this encounter.    Procedures: No procedures performed  Clinical Data: No additional findings.  ROS:  All other systems negative, except as noted in the HPI. Review of Systems  Objective: Vital Signs: There were no vitals taken for this visit.  Specialty Comments:  No specialty comments available.  PMFS History: Patient Active Problem List   Diagnosis Date Noted  . Obstructive sleep apnea treated with  continuous positive airway pressure (CPAP) 05/04/2018  . Diabetes mellitus type 2, uncontrolled ([place]) 04/22/2018  . Hx of amputation of lesser toe, left ([place]) 04/22/2018  . Gangrene of toe of left foot ([place])   . Atherosclerosis of native arteries of the extremities with gangrene ([place]) 04/09/2018  . Cellulitis in diabetic foot ([place]) 04/04/2018  . Injury of left toe 04/03/2018  . PAF (paroxysmal atrial fibrillation) ([place]) 12/05/2017  . Pacemaker   . Symptomatic bradycardia   . Asystole ([place])   . 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 Severe aortic stenosis   . Essential hypertension 11/14/2015  . PAD (peripheral artery disease) ([place]) 05/24/2014  . Diabetes ([place]) 10/06/2013  . Chronic kidney disease 08/30/2013  . Hypothyroidism 08/30/2013  . Obesity (BMI 30-39.9) 07/15/2013  . OSA on CPAP 07/15/2013  . Diabetes mellitus type 2, uncomplicated ([place])   . Hyperlipidemia 01/10/2011  . CAD (coronary artery disease) 01/10/2011   Past Medical History:  Diagnosis Date  . Chronic diastolic CHF (congestive heart failure) ([place])   . CKD (chronic kidney disease), stage III ([place])   . Coronary artery disease    a. Cath February 2012 in [facility], occluded RCA with collaterals  . DM type 2 (diabetes mellitus, type 2) ([place])   . Essential hypertension   . Hyperlipidemia   . Pacemaker    a. symptomatic brady after TAVR s/p MDT PPM by Dr. Curt Bears 12/04/17  . Persistent atrial fibrillation ([place])   . PONV (postoperative nausea and vomiting)    after valve surgery  . PVD (peripheral vascular disease) ([place])    a. s/p R popliteal artery stenosis tx with drug-coated balloon 05/2014, followed by Dr. Fletcher Anon.  . 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 Severe aortic stenosis    a. 12/02/17: s/p TAVR  . Skin cancer   . Sleep apnea with use of continuous  positive airway pressure (CPAP)    04-11-11 AHI was 32.9 and titrated to 15 cm H20, DME is [facility]  . Subclinical hypothyroidism     Family History  Problem Relation Age of Onset  . Diabetes Mother   . Heart attack Mother   . Hypertension Mother   . Heart attack Father   . Heart failure Father   . Hypertension Father   . Diabetes Father   . Diabetes Sister   . Diabetes Brother   . Diabetes Other   . Diabetes Daughter        TYPE ll  . Heart Problems Daughter   . Hypertension Sister   . Hypertension Brother   . Stroke Brother     Past Surgical History:  Procedure Laterality Date  . ABDOMINAL ANGIOGRAM N/A 06/08/2014   Procedure: ABDOMINAL ANGIOGRAM;  Surgeon: Wellington Hampshire, MD;  Location: [facility] CATH LAB;  Service: Cardiovascular;  Laterality:  N/A;  . ABDOMINAL AORTOGRAM N/A 04/09/2018   Procedure: ABDOMINAL AORTOGRAM;  Surgeon: Conrad Stover, MD;  Location: [facility] CV LAB;  Service: Cardiovascular;  Laterality: N/A;  . AMPUTATION Left 04/17/2018   Procedure: LEFT FOOT 3RD RAY AMPUTATION;  Surgeon: Newt Minion, MD;  Location: [facility];  Service: Orthopedics;  Laterality: Left;  . APPENDECTOMY  1965  . CARDIAC CATHETERIZATION  12/2010  . CARDIOVERSION  07/2011  . CARDIOVERSION N/A 04/18/2014   Procedure: CARDIOVERSION;  Surgeon: Dorothy Spark, MD;  Location: [facility];  Service: Cardiovascular;  Laterality: N/A;  . CARDIOVERSION N/A 11/03/2015   Procedure: CARDIOVERSION;  Surgeon: Lelon Perla, MD;  Location: [facility] ENDOSCOPY;  Service: Cardiovascular;  Laterality: N/A;  . CARDIOVERSION N/A 05/08/2017   Procedure: CARDIOVERSION;  Surgeon: Dorothy Spark, MD;  Location: [facility] ENDOSCOPY;  Service: Cardiovascular;  Laterality: N/A;  . CARDIOVERSION N/A 07/28/2017   Procedure: CARDIOVERSION;  Surgeon: Dorothy Spark, MD;  Location: [place];  Service: Cardiovascular;  Laterality: N/A;  . LOWER EXTREMITY ANGIOGRAM N/A 06/08/2014   Procedure: LOWER EXTREMITY ANGIOGRAM;  Surgeon:  Wellington Hampshire, MD;  Location: [facility] CATH LAB;  Service: Cardiovascular;  Laterality: N/A;  . LOWER EXTREMITY ANGIOGRAPHY Left 04/09/2018   Procedure: Lower Extremity Angiography;  Surgeon: Conrad North Pearsall, MD;  Location: [place] CV LAB;  Service: Cardiovascular;  Laterality: Left;  . PACEMAKER IMPLANT N/A 12/04/2017   Procedure: PACEMAKER IMPLANT;  Surgeon: Constance Haw, MD;  Location: [facility] CV LAB;  Service: Cardiovascular;  Laterality: N/A;  . PERIPHERAL VASCULAR BALLOON ANGIOPLASTY Left 04/09/2018   Procedure: PERIPHERAL VASCULAR BALLOON ANGIOPLASTY;  Surgeon: Conrad Cable, MD;  Location: [place] CV LAB;  Service: Cardiovascular;  Laterality: Left;  SFA  . POPLITEAL ARTERY ANGIOPLASTY Right 06/08/2014   Archie Endo 06/08/2014  . RIGHT/LEFT HEART CATH AND CORONARY ANGIOGRAPHY N/A 10/08/2017   Procedure: RIGHT/LEFT HEART CATH AND CORONARY ANGIOGRAPHY;  Surgeon: Burnell Blanks, MD;  Location: [facility] CV LAB;  Service: Cardiovascular;  Laterality: N/A;  . SKIN CANCER EXCISION Bilateral    "have had them cut off back of neck X 2; off left upper arm; right wrist, near right shoulder blade" (06/08/2014)  . TEE WITHOUT CARDIOVERSION N/A 12/02/2017   Procedure: TRANSESOPHAGEAL ECHOCARDIOGRAM (TEE);  Surgeon: Burnell Blanks, MD;  Location: [facility];  Service: Open Heart Surgery;  Laterality: N/A;  . TEMPORARY PACEMAKER N/A 12/04/2017   Procedure: TEMPORARY PACEMAKER;  Surgeon: Leonie Man, MD;  Location: [facility] CV LAB;  Service: Cardiovascular;  Laterality: N/A;  . TRANSCATHETER AORTIC VALVE REPLACEMENT, TRANSFEMORAL N/A 12/02/2017   Procedure: TRANSCATHETER AORTIC VALVE REPLACEMENT, TRANSFEMORAL;  Surgeon: Burnell Blanks, MD;  Location: [facility];  Service: Open Heart Surgery;  Laterality: N/A;  using Edwards Sapien 3 Transcatheter Heart Valve size 82mm   Social History   Occupational History  . Occupation: retired    Fish farm manager: [employer]  Tobacco  Use  . Smoking status: Former Smoker    Packs/day: 2.00    Years: 14.00    Pack years: 28.00    Types: Cigarettes    Last attempt to quit: 11/11/1972    Years since quitting: 45.5  . Smokeless tobacco: Never Used  Substance and Sexual Activity  . Alcohol use: No    Comment: quit in 1984  . Drug use: No  . Sexual activity: Not Currently

## 2018-05-22 ENCOUNTER — Telehealth: Payer: Self-pay | Admitting: Cardiovascular Disease

## 2018-05-22 ENCOUNTER — Ambulatory Visit (HOSPITAL_COMMUNITY): Payer: Medicare Other

## 2018-05-22 NOTE — Telephone Encounter (Signed)
New message    Wife would like to speak to you about patient having his leg amputated. Do you think he is strong enough from the heart surgery to go through another surgery

## 2018-05-22 NOTE — Telephone Encounter (Signed)
Spoke with wife, DPR on file.  She states [facility] is wanting pt to have leg amputated.  Wife wants to know if Dr. Angelena Form feels pt is strong enough to move forward with this type of surgery?  Advised I would send message to Dr. Angelena Form for review.

## 2018-05-22 NOTE — Telephone Encounter (Signed)
I think his heart is strong enough to go through with the surgery. Will you let her know that I am sorry he is having to have his leg amputated. Please let her know to call me if they need anything from me. Thanks, chris

## 2018-05-22 NOTE — Telephone Encounter (Signed)
Spoke with wife and informed her of Dr. Camillia Herter response.  Advised her to have ortho office send over clearance and we will get that taken care of.  Wife very appreciative for call.

## 2018-05-23 ENCOUNTER — Other Ambulatory Visit: Payer: Self-pay | Admitting: Physician Assistant

## 2018-05-25 ENCOUNTER — Ambulatory Visit (HOSPITAL_COMMUNITY): Payer: Medicare Other

## 2018-05-25 ENCOUNTER — Ambulatory Visit: Payer: Medicare Other | Admitting: Physician Assistant

## 2018-05-25 ENCOUNTER — Other Ambulatory Visit: Payer: Self-pay | Admitting: Orthopedic Surgery

## 2018-05-25 DIAGNOSIS — I96 Gangrene, not elsewhere classified: Secondary | ICD-10-CM | POA: Diagnosis not present

## 2018-05-25 DIAGNOSIS — M79672 Pain in left foot: Secondary | ICD-10-CM | POA: Diagnosis not present

## 2018-05-26 ENCOUNTER — Ambulatory Visit (INDEPENDENT_AMBULATORY_CARE_PROVIDER_SITE_OTHER)
Admission: RE | Admit: 2018-05-26 | Discharge: 2018-05-26 | Disposition: A | Discharge status: Home / Self Care | Payer: Medicare Other | Source: Ambulatory Visit | Attending: Vascular Surgery | Admitting: Vascular Surgery

## 2018-05-26 DIAGNOSIS — I70263 Atherosclerosis of native arteries of extremities with gangrene, bilateral legs: Secondary | ICD-10-CM | POA: Insufficient documentation

## 2018-05-26 DIAGNOSIS — Z01818 Encounter for other preprocedural examination: Secondary | ICD-10-CM | POA: Insufficient documentation

## 2018-05-26 DIAGNOSIS — I739 Peripheral vascular disease, unspecified: Secondary | ICD-10-CM

## 2018-05-26 DIAGNOSIS — Z48812 Encounter for surgical aftercare following surgery on the circulatory system: Secondary | ICD-10-CM

## 2018-05-26 NOTE — Progress Notes (Signed)
Established Critical Limb Ischemia Patient   History of Present Illness   Steven Maldonado is a 77 y.o. (Dec 19, 1940) male who presents with chief complaint: left foot gangrene.   The patient underwent attempt at endovascular recannulation of L pop artery. I stented the L SFA to try to improve collateral flow to the left foot on 04/09/18.  The patient underwent L 3rd ray amp with Dr. Sharol Given on 04/17/18.  This did not heal and ischemia progressed.  Dr. Sharol Given is recommending L BKA.  The patient has no rest pain and wounds include: non-healing L 3rd ray amp and dorsal black eschar.  The patient notes symptoms have not progressed.  The patient's treatment regimen currently included: maximal medical management and L BKA tomorrow.  The patient's PMH, PSH, SH, and FamHx were reviewed on 05/27/18 are unchanged from 0.  Current Outpatient Medications  Medication Sig Dispense Refill  . acetaminophen (TYLENOL) 500 MG tablet Take 1,000 mg by mouth at bedtime.    [name] Kitchen amiodarone (PACERONE) 200 MG tablet Take 1 tablet (200 mg total) by mouth daily. 90 tablet 3  . aspirin EC 81 MG tablet Take 81 mg by mouth daily after breakfast.     . atorvastatin (LIPITOR) 80 MG tablet Take 80 mg by mouth at bedtime.    . BD PEN NEEDLE NANO U/F 32G X 4 MM MISC USE 4 (FOUR) TIMES DAILY. (Patient not taking: Reported on 05/25/2018) 100 each 1  . Blood Glucose Monitoring Suppl (FREESTYLE LITE) DEVI 1 each by Does not apply route 2 (two) times daily. (Patient not taking: Reported on 05/25/2018) 1 each 0  . CINNAMON PO Take 3,000 mg by mouth daily.     . clotrimazole (LOTRIMIN) 1 % cream Apply 1 application topically 2 (two) times daily. 30 g 0  . dabigatran (PRADAXA) 150 MG CAPS capsule Take 150 mg by mouth every 12 (twelve) hours.     . docusate sodium (COLACE) 100 MG capsule Take 100 mg by mouth daily after breakfast.     . furosemide (LASIX) 20 MG tablet Take 1 tablet (20 mg total) by mouth daily. 90 tablet 3  . gabapentin  (NEURONTIN) 300 MG capsule Take 1 capsule (300 mg total) by mouth 3 (three) times daily. (Patient not taking: Reported on 05/25/2018) 90 capsule 5  . glipiZIDE (GLUCOTROL XL) 10 MG 24 hr tablet TAKE 1 TABLET (10 MG TOTAL) BY MOUTH DAILY WITH BREAKFAST. 30 tablet 5  . glucose blood (FREESTYLE LITE) test strip 1 each by Other route 4 (four) times daily -  before meals and at bedtime. (Patient not taking: Reported on 05/25/2018) 450 each 3  . HYDROcodone-acetaminophen (NORCO/VICODIN) 5-325 MG tablet Take 1 tablet by mouth every 4 (four) hours as needed for moderate pain. (Patient not taking: Reported on 05/25/2018) 30 tablet 0  . Insulin Glargine (BASAGLAR KWIKPEN) 100 UNIT/ML SOPN Inject 0.65 mLs (65 Units total) into the skin at bedtime. 30 mL 0  . insulin lispro (HUMALOG) 100 UNIT/ML KiwkPen Inject 0.05 mLs (5 Units total) into the skin 3 (three) times daily. 15 mL 3  . levothyroxine (SYNTHROID, LEVOTHROID) 150 MCG tablet TAKE 1 TABLET (150 MCG TOTAL) BY MOUTH DAILY. TAKE FOR 3 WEEKS THEN FOLLOW UP WITH PROVIDER (Patient not taking: Reported on 05/25/2018) 21 tablet 0  . magnesium oxide (MAG-OX) 400 MG tablet Take 400 mg by mouth daily after breakfast.     . metoprolol tartrate (LOPRESSOR) 50 MG tablet Take 75 mg by mouth 2 (  two) times daily.    [name] Kitchen MYRBETRIQ 25 MG TB24 tablet TAKE 1 TABLET BY MOUTH EVERY DAY 90 tablet 0  . nitroGLYCERIN (NITRODUR - DOSED IN MG/24 HR) 0.2 mg/hr patch Place 1 patch (0.2 mg total) onto the skin daily. 30 patch 12  . pentoxifylline (TRENTAL) 400 MG CR tablet Take 1 tablet (400 mg total) by mouth 3 (three) times daily with meals. 90 tablet 3  . sulfamethoxazole-trimethoprim (BACTRIM DS,SEPTRA DS) 800-160 MG tablet Take 1 tablet by mouth daily. 28 tablet 0   No current facility-administered medications for this visit.     On ROS today: +gangrene, no fever or chills   Physical Examination   Vitals:   05/27/18 1335  BP: 122/68  Pulse: (!) 59  Resp: 20  Temp: (!) 97.5  F (36.4 C)  TempSrc: Oral  SpO2: 98%  Weight: 254 lb (115.2 kg)  Height: 5\' 11"  (1.803 m)   Body mass index is 35.43 kg/m.  General Alert, O x 3, WD, NAD  Pulmonary Sym exp, good B air movt, CTA B  Cardiac RRR, Nl S1, S2, no Murmurs, No rubs, No S3,S4  Vascular Vessel Right Left  Radial Palpable Palpable  Brachial Palpable Palpable  Carotid Palpable, No Bruit Palpable, No Bruit  Aorta Not palpable N/A  Femoral Palpable Palpable  Popliteal Not palpable Not palpable  PT Not palpable Not palpable  DP Not palpable Faintly palpable    Gastro- intestinal soft, non-distended, non-tender to palpation, No guarding or rebound, no HSM, no masses, no CVAT B, No palpable prominent aortic pulse,    Musculo- skeletal M/S 5/5 throughout  , R foot without ischemic changes, L 3rd ray amp with dry gangrene at level of MT, black eschar on dorsum of foot, +seropurlent drainage from L foot  , No edema present, No visible varicosities , No Lipodermatosclerosis present  Neurologic Pain and light touch intact in extremities except for decreased sensation in B feet, Motor exam as listed above    Medical Decision Making   Steven Maldonado is a 77 y.o. 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  Pt has a popliteal CTO, so L BKA stump would be persisting on lateral geniculate collateral, so the perfusion for the BKA may be tenuous.  Based on the patient's vascular studies and examination, I have offered the patient: R ABI in 3 months..  I discussed in depth with the patient the nature of atherosclerosis, and emphasized the importance of maximal medical management including strict control of blood pressure, blood glucose, and lipid levels, antiplatelet agents, obtaining regular exercise, and cessation of smoking.    The patient is aware that without maximal medical management the underlying atherosclerotic disease process will progress,  limiting the benefit of any interventions.  The patient is currently on a statin: Lipitor.   The patient is currently on an anti-platelet: ASA.  Thank you for allowing [place] to participate in this patient's care.   Adele Barthel, MD, FACS [facility] Office: [phone] Pager: [phone]

## 2018-05-27 ENCOUNTER — Ambulatory Visit (INDEPENDENT_AMBULATORY_CARE_PROVIDER_SITE_OTHER): Payer: Medicare Other | Admitting: Vascular Surgery

## 2018-05-27 ENCOUNTER — Ambulatory Visit (HOSPITAL_COMMUNITY): Payer: Medicare Other

## 2018-05-27 ENCOUNTER — Encounter: Payer: Self-pay | Admitting: Vascular Surgery

## 2018-05-27 ENCOUNTER — Other Ambulatory Visit: Payer: Self-pay

## 2018-05-27 ENCOUNTER — Encounter (HOSPITAL_COMMUNITY): Payer: Self-pay | Admitting: *Deleted

## 2018-05-27 VITALS — BP 122/68 | HR 59 | Temp 97.5°F | Resp 20 | Ht 71.0 in | Wt 254.0 lb

## 2018-05-27 DIAGNOSIS — I70262 Atherosclerosis of native arteries of extremities with gangrene, left leg: Secondary | ICD-10-CM

## 2018-05-27 DIAGNOSIS — I6523 Occlusion and stenosis of bilateral carotid arteries: Secondary | ICD-10-CM | POA: Diagnosis not present

## 2018-05-27 MED ORDER — DEXTROSE 5 % IV SOLN
3.0000 g | INTRAVENOUS | Status: AC
  Administered 2018-05-28: 3 g via INTRAVENOUS
  Filled 2018-05-27: qty 3

## 2018-05-27 NOTE — Progress Notes (Addendum)
Spoke with pt's wife, [name] for pre-op call. DPR on file. Pt has extensive cardiac history, she states pt has not had any recent chest pain or sob. Pt does have a pacemaker. Pt is a type 2 diabetic. Pt's A1C was 11.6 on 04/17/18. [name] states she feels that the A1C would be lower now because pt's fasting blood sugar has been under 110 for 2 months. Instructed Beverly to have pt take 1/2 of his regular dose of Basaglar Insulin this evening. Instructed her to have pt check his blood sugar in the AM when he gets up and every 2 hours until he leaves for the hospital. If blood sugar is 70 or below, treat with 1/2 cup of clear juice (apple or cranberry) and recheck blood sugar 15 minutes after drinking juice. If blood sugar continues to be 70 or below, call the Short Stay department and ask to speak to a nurse. Beverly voiced understanding. Pt is not to take his Humalog insulin in the AM.  Instructed pt to be NPO 6 hours (per Willeen Cass, NP) prior to surgery start time. [name] voiced understanding   Pt's last dose of Pradaxa was 05/25/18 in the AM. [name] states they were not instructed to stop Aspirin.

## 2018-05-27 NOTE — Progress Notes (Signed)
Anesthesia Chart Review: Steven Maldonado  Case:  [id number] Date/Time:  05/28/18 1515   Procedure:  AMPUTATION BELOW KNEE (Left )   Anesthesia type:  General   Pre-op diagnosis:  Left foot gangrene   Location:  [facility] OR ROOM 07 / [place] [facility]   Surgeon:  Wylene Simmer, MD      DISCUSSION: Patient is a 77 year old male scheduled for the above procedure. 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  Per PAT RN notations, patient's wife said that although last A1c was 11.6, more recently he had had better contorl with fasting CBGs < 110 for 2 months. He is for labs on the day of surgery. If glucose significantly elevated than urgency of the procedure will need to be considered and hyperglycemia treated as indicated.    Patient or his wife apparently reached out to cardiologist Dr. Angelena Form regarding surgery plans. Dr. Angelena Form advised that he did "think his heart is strong enough to go through with the surgery." Reportedly patient instructed to hold Pradaxa after 05/25/18 AM dose and was not instructed to hold ASA.  PPM perioperative device form is still pending. Anesthesiologist to evaluate patient on the day of surgery and discuss definitive anesthesia plan.   VS: To be done on the day of surgery.  PROVIDERS: PCP is Dena Billet, [name]. Cardiologist is [name]. Last visit 05/07/18. Six month follow-up recommended. PV cardiologist is Dr. [name]. EP cardiologist is Allegra Lai, MD. Last visit 03/04/18.  No changes made to PPM settings at that time. He has been seen by Roderic Palau, NP at the [facility]. Vascular surgery is Dr. Adele Barthel. Dohmeier, Asencion Partridge, MD is neurologist (for OSA). Last visit 05/04/18 with Evlyn Courier, NP.   LABS: He is a same day work-up, so he will get labs on the day of surgery. Last A1c on 04/17/18 was elevated at 11.6 (up from 8.1 on 11/28/17, but similar to readings from 2018). Reportedly fasting CBGs have been "less than 110" over the past two months.    OTHER: PFTs 10/31/17: FVC 2.78 (62%), FEV1 2.48 (76%), DLCO unc 22.45 (66%).   IMAGES: CXR 12/05/17:  IMPRESSION: New pacemaker without pneumothorax. Mild enlargement of cardiac silhouette post TAVR.   EKG: 02/04/18: Electronic ventricular pacemaker.   CV: Echo 12/24/17: Study Conclusions - Left ventricle: Inferobasal hypokinesis The cavity size was   normal. Wall thickness was increased in a pattern of severe LVH.   Systolic function was normal. The estimated ejection fraction was   in the range of 55% to 60%. - Aortic valve: Post TAVR with no significant peri valvualr   regurgitation gradients have increased since 12/03/17. - Mitral valve: Severely calcified annulus. Moderately thickened,   moderately calcified leaflets . There was mild regurgitation. - Left atrium: The atrium was moderately dilated. - Atrial septum: No defect or patent foramen ovale was identified.  Cardiac cath 10/08/17 (PRE-TAVR):  Prox RCA lesion is 100% stenosed.  Ost LAD to Prox LAD lesion is 60% stenosed.  There is severe aortic valve stenosis.  Hemodynamic findings  consistent with mild pulmonary hypertension. 1. Chronic total occlusion of the RCA. The mid and distal RCA fills from left to right collaterals.  2. Moderate stenosis proximal LAD. This does not appear to be flow limiting.  3. Severe aortic valve stenosis (mean gradient 41.8 mmHg, peak to peak gradient 44 mmHg, AVA 0.79 cm2) Recommendations: Will continue  workup for TAVR.   Carotid U/S 04/23/18: Final Interpretation: Final Interpretation: Right Carotid: Velocities in the right ICA are consistent with a 1-39% stenosis. Left Carotid: Velocities in the left ICA are consistent with a 40-59% stenosis. Vertebrals: Bilateral vertebral arteries demonstrate antegrade flow. Subclavians: Normal flow hemodynamics were seen in bilateral subclavian arteries.   Past Medical History:  Diagnosis Date  . Chronic diastolic CHF (congestive heart failure) ([place])   . CKD (chronic kidney disease), stage III ([place])   . Constipation   . Coronary artery disease    a. Cath February 2012 in [place], occluded RCA with collaterals  . DM type 2 (diabetes mellitus, type 2) ([place])   . Essential hypertension   . Hyperlipidemia   . Neuropathy    feet  . Pacemaker    a. symptomatic brady after TAVR s/p MDT PPM by Dr. Curt Bears 12/04/17  . Persistent atrial fibrillation ([place])   . PONV (postoperative nausea and vomiting)    after valve surgery  . PVD (peripheral vascular disease) ([place])    a. s/p R popliteal artery stenosis tx with drug-coated balloon 05/2014, followed by Dr. Fletcher Anon.  . 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 Severe aortic stenosis    a. 12/02/17: s/p TAVR  . Skin cancer   . Sleep apnea with use of continuous positive airway pressure (CPAP)    04-11-11 AHI was 32.9 and titrated to 15 cm H20, DME is [facility]  . Subclinical hypothyroidism     Past Surgical History:  Procedure Laterality Date  . ABDOMINAL ANGIOGRAM N/A 06/08/2014   Procedure: ABDOMINAL ANGIOGRAM;  Surgeon: Wellington Hampshire, MD;  Location[facility] C[facility] LAB;  Service: Cardiovascular;  Laterality: N/A;  . ABDOMINAL AORTOGRAM N/A 04/09/2018   Procedure: ABDOMINAL AORTOGRAM;  Surgeon: Conrad Brookdale, MD;  Location: [place] CV LAB;  Service: Cardiovascular;  Laterality: N/A;  .  AMPUTATION Left 04/17/2018   Procedure: LEFT FOOT 3RD RAY AMPUTATION;  Surgeon: Newt Minion, MD;  Location: [place];  Service: Orthopedics;  Laterality: Left;  . APPENDECTOMY  1965  . CARDIAC CATHETERIZATION  12/2010  . CARDIOVERSION  07/2011  . CARDIOVERSION N/A 04/18/2014   Procedure: CARDIOVERSION;  Surgeon: Dorothy Spark, MD;  Location: [place];  Service: Cardiovascular;  Laterality: N/A;  . CARDIOVERSION N/A 11/03/2015   Procedure: CARDIOVERSION;  Surgeon: Lelon Perla, MD;  Location: [facility] ENDOSCOPY;  Service: Cardiovascular;  Laterality: N/A;  . CARDIOVERSION N/A 05/08/2017   Procedure: CARDIOVERSION;  Surgeon: Dorothy Spark, MD;  Location: [facility] ENDOSCOPY;  Service: Cardiovascular;  Laterality: N/A;  . CARDIOVERSION N/A 07/28/2017   Procedure: CARDIOVERSION;  Surgeon: Dorothy Spark, MD;  Location: [place];  Service: Cardiovascular;  Laterality: N/A;  . LOWER EXTREMITY ANGIOGRAM N/A 06/08/2014   Procedure: LOWER EXTREMITY ANGIOGRAM;  Surgeon: Wellington Hampshire, MD;  Location: [facility] CATH LAB;  Service: Cardiovascular;  Laterality: N/A;  . LOWER EXTREMITY ANGIOGRAPHY Left 04/09/2018   Procedure: Lower Extremity Angiography;  Surgeon: Conrad Depew, MD;  Location: [place] CV LAB;  Service: Cardiovascular;  Laterality: Left;  . PACEMAKER IMPLANT N/A 12/04/2017   Procedure: PACEMAKER IMPLANT;  Surgeon: Constance Haw, MD;  Location: [place] CV LAB;  Service: Cardiovascular;  Laterality: N/A;  . PERIPHERAL VASCULAR BALLOON ANGIOPLASTY Left 04/09/2018   Procedure: PERIPHERAL VASCULAR BALLOON ANGIOPLASTY;  Surgeon: Conrad [place], MD;  Location: [place] CV LAB;  Service: Cardiovascular;  Laterality: Left;  SFA  . POPLITEAL ARTERY ANGIOPLASTY Right 06/08/2014   Archie Endo 06/08/2014  . RIGHT/LEFT HEART CATH AND CORONARY ANGIOGRAPHY N/A 10/08/2017   Procedure: RIGHT/LEFT HEART CATH AND CORONARY ANGIOGRAPHY;  Surgeon: Burnell Blanks, MD;  Location: [place] CV LAB;   Service: Cardiovascular;  Laterality: N/A;  . SKIN CANCER EXCISION Bilateral    "have had them cut off back of neck X 2; off left upper arm; right wrist, near right shoulder blade" (06/08/2014)  . TEE WITHOUT CARDIOVERSION N/A 12/02/2017   Procedure: TRANSESOPHAGEAL ECHOCARDIOGRAM (TEE);  Surgeon: Burnell Blanks, MD;  Location: [place];  Service: Open Heart Surgery;  Laterality: N/A;  . TEMPORARY PACEMAKER N/A 12/04/2017   Procedure: TEMPORARY PACEMAKER;  Surgeon: Leonie Man, MD;  Location: [place] CV LAB;  Service: Cardiovascular;  Laterality: N/A;  . TRANSCATHETER AORTIC VALVE REPLACEMENT, TRANSFEMORAL N/A 12/02/2017   Procedure: TRANSCATHETER AORTIC VALVE REPLACEMENT, TRANSFEMORAL;  Surgeon: Burnell Blanks, MD;  Location: [place];  Service: Open Heart Surgery;  Laterality: N/A;  using Edwards Sapien 3 Transcatheter Heart Valve size 85mm    MEDICATIONS: No current facility-administered medications for this encounter.    [name] Kitchen acetaminophen (TYLENOL) 500 MG tablet  . amiodarone (PACERONE) 200 MG tablet  . aspirin EC 81 MG tablet  . atorvastatin (LIPITOR) 80 MG tablet  . CINNAMON PO  . clotrimazole (LOTRIMIN) 1 % cream  . dabigatran (PRADAXA) 150 MG CAPS capsule  . docusate sodium (COLACE) 100 MG capsule  . furosemide (LASIX) 20 MG tablet  . glipiZIDE (GLUCOTROL XL) 10 MG 24 hr tablet  . Insulin Glargine (BASAGLAR KWIKPEN) 100 UNIT/ML SOPN  . insulin lispro (HUMALOG) 100 UNIT/ML KiwkPen  . magnesium oxide (MAG-OX) 400 MG tablet  . metoprolol tartrate (LOPRESSOR) 50 MG tablet  . MYRBETRIQ 25 MG TB24 tablet  . nitroGLYCERIN (NITRODUR - DOSED IN MG/24 HR) 0.2 mg/hr patch  . pentoxifylline (TRENTAL) 400 MG CR tablet  . sulfamethoxazole-trimethoprim (BACTRIM DS,SEPTRA DS) 800-160 MG tablet  . BD PEN NEEDLE NANO U/F 32G X 4 MM MISC  . Blood Glucose Monitoring Suppl (FREESTYLE LITE) DEVI  . gabapentin (NEURONTIN) 300 MG capsule  . glucose blood (FREESTYLE LITE) test strip   . HYDROcodone-acetaminophen (NORCO/VICODIN) 5-325 MG tablet  . levothyroxine (SYNTHROID, LEVOTHROID) 150 MCG tablet    [facility] Short Stay Center/Anesthesiology Phone [phone] 05/27/2018 1:02 PM

## 2018-05-28 ENCOUNTER — Encounter (HOSPITAL_COMMUNITY): Payer: Self-pay

## 2018-05-28 ENCOUNTER — Inpatient Hospital Stay (HOSPITAL_COMMUNITY)
Admission: RE | Admit: 2018-05-28 | Discharge: 2018-06-01 | DRG: 240 | Disposition: A | Discharge status: Skilled Nursing Facility | Payer: Medicare Other | Attending: Orthopedic Surgery | Admitting: Orthopedic Surgery

## 2018-05-28 ENCOUNTER — Other Ambulatory Visit: Payer: Self-pay

## 2018-05-28 ENCOUNTER — Inpatient Hospital Stay (HOSPITAL_COMMUNITY): Payer: Medicare Other | Admitting: Vascular Surgery

## 2018-05-28 ENCOUNTER — Encounter (HOSPITAL_COMMUNITY)
Admission: RE | Disposition: A | Discharge status: Skilled Nursing Facility | Payer: Self-pay | Source: Home / Self Care | Attending: Orthopedic Surgery

## 2018-05-28 DIAGNOSIS — R278 Other lack of coordination: Secondary | ICD-10-CM | POA: Diagnosis not present

## 2018-05-28 DIAGNOSIS — Z8249 Family history of ischemic heart disease and other diseases of the circulatory system: Secondary | ICD-10-CM

## 2018-05-28 DIAGNOSIS — Z950 Presence of cardiac pacemaker: Secondary | ICD-10-CM | POA: Diagnosis not present

## 2018-05-28 DIAGNOSIS — I739 Peripheral vascular disease, unspecified: Secondary | ICD-10-CM | POA: Diagnosis not present

## 2018-05-28 DIAGNOSIS — Z95820 Peripheral vascular angioplasty status with implants and grafts: Secondary | ICD-10-CM | POA: Diagnosis not present

## 2018-05-28 DIAGNOSIS — N179 Acute kidney failure, unspecified: Secondary | ICD-10-CM | POA: Diagnosis present

## 2018-05-28 DIAGNOSIS — E1159 Type 2 diabetes mellitus with other circulatory complications: Secondary | ICD-10-CM | POA: Diagnosis present

## 2018-05-28 DIAGNOSIS — M2550 Pain in unspecified joint: Secondary | ICD-10-CM | POA: Diagnosis not present

## 2018-05-28 DIAGNOSIS — N3281 Overactive bladder: Secondary | ICD-10-CM | POA: Diagnosis not present

## 2018-05-28 DIAGNOSIS — I481 Persistent atrial fibrillation: Secondary | ICD-10-CM | POA: Diagnosis not present

## 2018-05-28 DIAGNOSIS — Z87891 Personal history of nicotine dependence: Secondary | ICD-10-CM

## 2018-05-28 DIAGNOSIS — N183 Chronic kidney disease, stage 3 (moderate): Secondary | ICD-10-CM | POA: Diagnosis present

## 2018-05-28 DIAGNOSIS — I5032 Chronic diastolic (congestive) heart failure: Secondary | ICD-10-CM | POA: Diagnosis not present

## 2018-05-28 DIAGNOSIS — Z9989 Dependence on other enabling machines and devices: Secondary | ICD-10-CM | POA: Diagnosis not present

## 2018-05-28 DIAGNOSIS — Z794 Long term (current) use of insulin: Secondary | ICD-10-CM

## 2018-05-28 DIAGNOSIS — Z7901 Long term (current) use of anticoagulants: Secondary | ICD-10-CM | POA: Diagnosis not present

## 2018-05-28 DIAGNOSIS — I2510 Atherosclerotic heart disease of native coronary artery without angina pectoris: Secondary | ICD-10-CM | POA: Diagnosis not present

## 2018-05-28 DIAGNOSIS — M86172 Other acute osteomyelitis, left ankle and foot: Secondary | ICD-10-CM | POA: Diagnosis present

## 2018-05-28 DIAGNOSIS — Z952 Presence of prosthetic heart valve: Secondary | ICD-10-CM | POA: Diagnosis not present

## 2018-05-28 DIAGNOSIS — I350 Nonrheumatic aortic (valve) stenosis: Secondary | ICD-10-CM | POA: Diagnosis not present

## 2018-05-28 DIAGNOSIS — Z85828 Personal history of other malignant neoplasm of skin: Secondary | ICD-10-CM | POA: Diagnosis not present

## 2018-05-28 DIAGNOSIS — I130 Hypertensive heart and chronic kidney disease with heart failure and stage 1 through stage 4 chronic kidney disease, or unspecified chronic kidney disease: Secondary | ICD-10-CM | POA: Diagnosis present

## 2018-05-28 DIAGNOSIS — Z89422 Acquired absence of other left toe(s): Secondary | ICD-10-CM

## 2018-05-28 DIAGNOSIS — Z4781 Encounter for orthopedic aftercare following surgical amputation: Secondary | ICD-10-CM | POA: Diagnosis not present

## 2018-05-28 DIAGNOSIS — I152 Hypertension secondary to endocrine disorders: Secondary | ICD-10-CM | POA: Diagnosis present

## 2018-05-28 DIAGNOSIS — E1165 Type 2 diabetes mellitus with hyperglycemia: Secondary | ICD-10-CM | POA: Diagnosis present

## 2018-05-28 DIAGNOSIS — L97529 Non-pressure chronic ulcer of other part of left foot with unspecified severity: Secondary | ICD-10-CM | POA: Diagnosis not present

## 2018-05-28 DIAGNOSIS — E039 Hypothyroidism, unspecified: Secondary | ICD-10-CM | POA: Diagnosis present

## 2018-05-28 DIAGNOSIS — Z823 Family history of stroke: Secondary | ICD-10-CM

## 2018-05-28 DIAGNOSIS — E119 Type 2 diabetes mellitus without complications: Secondary | ICD-10-CM

## 2018-05-28 DIAGNOSIS — G4733 Obstructive sleep apnea (adult) (pediatric): Secondary | ICD-10-CM | POA: Diagnosis not present

## 2018-05-28 DIAGNOSIS — E785 Hyperlipidemia, unspecified: Secondary | ICD-10-CM | POA: Diagnosis not present

## 2018-05-28 DIAGNOSIS — I96 Gangrene, not elsewhere classified: Secondary | ICD-10-CM | POA: Diagnosis present

## 2018-05-28 DIAGNOSIS — K5900 Constipation, unspecified: Secondary | ICD-10-CM | POA: Diagnosis not present

## 2018-05-28 DIAGNOSIS — I70262 Atherosclerosis of native arteries of extremities with gangrene, left leg: Secondary | ICD-10-CM | POA: Diagnosis not present

## 2018-05-28 DIAGNOSIS — I10 Essential (primary) hypertension: Secondary | ICD-10-CM | POA: Diagnosis not present

## 2018-05-28 DIAGNOSIS — I480 Paroxysmal atrial fibrillation: Secondary | ICD-10-CM | POA: Diagnosis present

## 2018-05-28 DIAGNOSIS — Z48812 Encounter for surgical aftercare following surgery on the circulatory system: Secondary | ICD-10-CM

## 2018-05-28 DIAGNOSIS — N189 Chronic kidney disease, unspecified: Secondary | ICD-10-CM | POA: Diagnosis not present

## 2018-05-28 DIAGNOSIS — Z79899 Other long term (current) drug therapy: Secondary | ICD-10-CM

## 2018-05-28 DIAGNOSIS — Z79890 Hormone replacement therapy (postmenopausal): Secondary | ICD-10-CM

## 2018-05-28 DIAGNOSIS — Z9049 Acquired absence of other specified parts of digestive tract: Secondary | ICD-10-CM

## 2018-05-28 DIAGNOSIS — Z89512 Acquired absence of left leg below knee: Secondary | ICD-10-CM | POA: Diagnosis not present

## 2018-05-28 DIAGNOSIS — R3915 Urgency of urination: Secondary | ICD-10-CM | POA: Diagnosis not present

## 2018-05-28 DIAGNOSIS — E1169 Type 2 diabetes mellitus with other specified complication: Secondary | ICD-10-CM | POA: Diagnosis not present

## 2018-05-28 DIAGNOSIS — R2681 Unsteadiness on feet: Secondary | ICD-10-CM | POA: Diagnosis not present

## 2018-05-28 DIAGNOSIS — I70263 Atherosclerosis of native arteries of extremities with gangrene, bilateral legs: Secondary | ICD-10-CM

## 2018-05-28 DIAGNOSIS — E1122 Type 2 diabetes mellitus with diabetic chronic kidney disease: Secondary | ICD-10-CM | POA: Diagnosis present

## 2018-05-28 DIAGNOSIS — E1140 Type 2 diabetes mellitus with diabetic neuropathy, unspecified: Secondary | ICD-10-CM | POA: Diagnosis present

## 2018-05-28 DIAGNOSIS — Z7982 Long term (current) use of aspirin: Secondary | ICD-10-CM

## 2018-05-28 DIAGNOSIS — R41841 Cognitive communication deficit: Secondary | ICD-10-CM | POA: Diagnosis not present

## 2018-05-28 DIAGNOSIS — Z6835 Body mass index (BMI) 35.0-35.9, adult: Secondary | ICD-10-CM

## 2018-05-28 DIAGNOSIS — Z8679 Personal history of other diseases of the circulatory system: Secondary | ICD-10-CM

## 2018-05-28 DIAGNOSIS — M79605 Pain in left leg: Secondary | ICD-10-CM | POA: Diagnosis not present

## 2018-05-28 DIAGNOSIS — Z833 Family history of diabetes mellitus: Secondary | ICD-10-CM

## 2018-05-28 DIAGNOSIS — E1152 Type 2 diabetes mellitus with diabetic peripheral angiopathy with gangrene: Secondary | ICD-10-CM | POA: Diagnosis not present

## 2018-05-28 DIAGNOSIS — Z7401 Bed confinement status: Secondary | ICD-10-CM | POA: Diagnosis not present

## 2018-05-28 DIAGNOSIS — E669 Obesity, unspecified: Secondary | ICD-10-CM | POA: Diagnosis not present

## 2018-05-28 HISTORY — DX: Polyneuropathy, unspecified: G62.9

## 2018-05-28 HISTORY — PX: BELOW KNEE LEG AMPUTATION: SUR23

## 2018-05-28 HISTORY — PX: AMPUTATION: SHX166

## 2018-05-28 HISTORY — DX: Constipation, unspecified: K59.00

## 2018-05-28 LAB — CBC
HCT: 36.5 % — ABNORMAL LOW (ref 39.0–52.0)
Hemoglobin: 11.3 g/dL — ABNORMAL LOW (ref 13.0–17.0)
MCH: 30.1 pg (ref 26.0–34.0)
MCHC: 31 g/dL (ref 30.0–36.0)
MCV: 97.1 fL (ref 78.0–100.0)
Platelets: 290 10*3/uL (ref 150–400)
RBC: 3.76 MIL/uL — ABNORMAL LOW (ref 4.22–5.81)
RDW: 13.9 % (ref 11.5–15.5)
WBC: 8.6 10*3/uL (ref 4.0–10.5)

## 2018-05-28 LAB — BASIC METABOLIC PANEL
Anion gap: 13 (ref 5–15)
BUN: 24 mg/dL — ABNORMAL HIGH (ref 8–23)
CO2: 20 mmol/L — ABNORMAL LOW (ref 22–32)
Calcium: 9.9 mg/dL (ref 8.9–10.3)
Chloride: 102 mmol/L (ref 98–111)
Creatinine, Ser: 2 mg/dL — ABNORMAL HIGH (ref 0.61–1.24)
GFR calc Af Amer: 36 mL/min — ABNORMAL LOW (ref >60)
GFR calc non Af Amer: 31 mL/min — ABNORMAL LOW (ref >60)
Glucose, Bld: 134 mg/dL — ABNORMAL HIGH (ref 70–99)
Potassium: 4.9 mmol/L (ref 3.5–5.1)
Sodium: 135 mmol/L (ref 135–145)

## 2018-05-28 LAB — GLUCOSE, CAPILLARY
Glucose-Capillary: 137 mg/dL — ABNORMAL HIGH (ref 70–99)
Glucose-Capillary: 127 mg/dL — ABNORMAL HIGH (ref 70–99)
Glucose-Capillary: 160 mg/dL — ABNORMAL HIGH (ref 70–99)
Glucose-Capillary: 221 mg/dL — ABNORMAL HIGH (ref 70–99)

## 2018-05-28 SURGERY — AMPUTATION BELOW KNEE
Anesthesia: General | Site: Leg Lower | Laterality: Left

## 2018-05-28 MED ORDER — FUROSEMIDE 20 MG PO TABS
20.0000 mg | ORAL_TABLET | Freq: Every day | ORAL | Status: DC
  Administered 2018-05-30 – 2018-06-01 (×3): 20 mg via ORAL
  Filled 2018-05-28 (×3): qty 1

## 2018-05-28 MED ORDER — INSULIN GLARGINE 100 UNIT/ML ~~LOC~~ SOLN
65.0000 [IU] | Freq: Every day | SUBCUTANEOUS | Status: DC
  Administered 2018-05-28 – 2018-05-31 (×4): 65 [IU] via SUBCUTANEOUS
  Filled 2018-05-28 (×5): qty 0.65

## 2018-05-28 MED ORDER — LACTATED RINGERS IV SOLN
INTRAVENOUS | Status: AC
  Administered 2018-05-28: 19:00:00 via INTRAVENOUS

## 2018-05-28 MED ORDER — SCOPOLAMINE 1 MG/3DAYS TD PT72
1.0000 | MEDICATED_PATCH | TRANSDERMAL | Status: DC
  Administered 2018-05-28: 1 via TRANSDERMAL

## 2018-05-28 MED ORDER — FENTANYL CITRATE (PF) 250 MCG/5ML IJ SOLN
INTRAMUSCULAR | Status: AC
  Filled 2018-05-28: qty 5

## 2018-05-28 MED ORDER — SCOPOLAMINE 1 MG/3DAYS TD PT72
MEDICATED_PATCH | TRANSDERMAL | Status: AC
  Filled 2018-05-28: qty 1

## 2018-05-28 MED ORDER — NITROGLYCERIN 0.2 MG/HR TD PT24
0.2000 mg | MEDICATED_PATCH | Freq: Every day | TRANSDERMAL | Status: DC
  Filled 2018-05-28 (×4): qty 1

## 2018-05-28 MED ORDER — METOPROLOL TARTRATE 50 MG PO TABS
75.0000 mg | ORAL_TABLET | Freq: Two times a day (BID) | ORAL | Status: DC
  Administered 2018-05-28 – 2018-06-01 (×8): 75 mg via ORAL
  Filled 2018-05-28 (×7): qty 1

## 2018-05-28 MED ORDER — DEXAMETHASONE SODIUM PHOSPHATE 10 MG/ML IJ SOLN
INTRAMUSCULAR | Status: DC | PRN
  Administered 2018-05-28: 5 mg via INTRAVENOUS

## 2018-05-28 MED ORDER — PROPOFOL 10 MG/ML IV BOLUS
INTRAVENOUS | Status: AC
  Filled 2018-05-28: qty 20

## 2018-05-28 MED ORDER — ACETAMINOPHEN 650 MG RE SUPP: 650.0000 mg | Freq: Four times a day (QID) | RECTAL | Status: DC | PRN

## 2018-05-28 MED ORDER — ACETAMINOPHEN 325 MG PO TABS
650.0000 mg | ORAL_TABLET | Freq: Four times a day (QID) | ORAL | Status: DC | PRN
  Administered 2018-05-28 – 2018-05-30 (×5): 650 mg via ORAL
  Filled 2018-05-28 (×5): qty 2

## 2018-05-28 MED ORDER — KCL IN DEXTROSE-NACL 20-5-0.45 MEQ/L-%-% IV SOLN
INTRAVENOUS | Status: DC
  Filled 2018-05-28: qty 1000

## 2018-05-28 MED ORDER — PHENYLEPHRINE HCL 10 MG/ML IJ SOLN
INTRAMUSCULAR | Status: DC | PRN
  Administered 2018-05-28 (×4): 80 ug via INTRAVENOUS

## 2018-05-28 MED ORDER — FLEET ENEMA 7-19 GM/118ML RE ENEM: 1.0000 | ENEMA | Freq: Once | RECTAL | Status: DC | PRN

## 2018-05-28 MED ORDER — PROPOFOL 10 MG/ML IV BOLUS
INTRAVENOUS | Status: DC | PRN
  Administered 2018-05-28: 100 mg via INTRAVENOUS

## 2018-05-28 MED ORDER — LIDOCAINE 2% (20 MG/ML) 5 ML SYRINGE
INTRAMUSCULAR | Status: AC
  Filled 2018-05-28: qty 5

## 2018-05-28 MED ORDER — MIRABEGRON ER 25 MG PO TB24
25.0000 mg | ORAL_TABLET | Freq: Every day | ORAL | Status: DC
  Administered 2018-05-29 – 2018-06-01 (×4): 25 mg via ORAL
  Filled 2018-05-28 (×4): qty 1

## 2018-05-28 MED ORDER — MORPHINE SULFATE (PF) 2 MG/ML IV SOLN: 2.0000 mg | INTRAVENOUS | Status: DC | PRN

## 2018-05-28 MED ORDER — ASPIRIN EC 81 MG PO TBEC
81.0000 mg | DELAYED_RELEASE_TABLET | Freq: Every day | ORAL | Status: DC
  Administered 2018-05-29 – 2018-06-01 (×4): 81 mg via ORAL
  Filled 2018-05-28 (×4): qty 1

## 2018-05-28 MED ORDER — CHLORHEXIDINE GLUCONATE 4 % EX LIQD: 60.0000 mL | Freq: Once | CUTANEOUS | Status: DC

## 2018-05-28 MED ORDER — ONDANSETRON HCL 4 MG/2ML IJ SOLN
INTRAMUSCULAR | Status: AC
  Filled 2018-05-28: qty 2

## 2018-05-28 MED ORDER — FENTANYL CITRATE (PF) 100 MCG/2ML IJ SOLN
INTRAMUSCULAR | Status: AC
  Filled 2018-05-28: qty 2

## 2018-05-28 MED ORDER — INSULIN ASPART 100 UNIT/ML ~~LOC~~ SOLN
0.0000 [IU] | Freq: Three times a day (TID) | SUBCUTANEOUS | Status: DC
  Administered 2018-05-28: 3 [IU] via SUBCUTANEOUS
  Administered 2018-05-29: 2 [IU] via SUBCUTANEOUS
  Administered 2018-05-29 (×2): 5 [IU] via SUBCUTANEOUS
  Administered 2018-05-30 (×2): 3 [IU] via SUBCUTANEOUS
  Administered 2018-05-30: 2 [IU] via SUBCUTANEOUS
  Administered 2018-05-31 (×2): 3 [IU] via SUBCUTANEOUS
  Administered 2018-05-31: 5 [IU] via SUBCUTANEOUS
  Administered 2018-06-01: 3 [IU] via SUBCUTANEOUS
  Administered 2018-06-01 (×2): 2 [IU] via SUBCUTANEOUS

## 2018-05-28 MED ORDER — OXYCODONE HCL 5 MG PO TABS
5.0000 mg | ORAL_TABLET | ORAL | Status: DC | PRN
  Administered 2018-05-28: 10 mg via ORAL
  Administered 2018-05-29: 5 mg via ORAL
  Administered 2018-05-29: 10 mg via ORAL
  Administered 2018-05-30 (×2): 5 mg via ORAL
  Filled 2018-05-28 (×2): qty 1
  Filled 2018-05-28: qty 2
  Filled 2018-05-28: qty 1

## 2018-05-28 MED ORDER — LACTATED RINGERS IV SOLN
INTRAVENOUS | Status: DC
  Administered 2018-05-28: 14:00:00 via INTRAVENOUS

## 2018-05-28 MED ORDER — FENTANYL CITRATE (PF) 250 MCG/5ML IJ SOLN
INTRAMUSCULAR | Status: DC | PRN
  Administered 2018-05-28 (×2): 100 ug via INTRAVENOUS
  Administered 2018-05-28: 50 ug via INTRAVENOUS

## 2018-05-28 MED ORDER — AMIODARONE HCL 200 MG PO TABS
200.0000 mg | ORAL_TABLET | Freq: Every day | ORAL | Status: DC
  Administered 2018-05-29 – 2018-06-01 (×4): 200 mg via ORAL
  Filled 2018-05-28 (×4): qty 1

## 2018-05-28 MED ORDER — SODIUM CHLORIDE 0.9 % IV SOLN: INTRAVENOUS | Status: DC

## 2018-05-28 MED ORDER — DABIGATRAN ETEXILATE MESYLATE 150 MG PO CAPS
150.0000 mg | ORAL_CAPSULE | Freq: Two times a day (BID) | ORAL | Status: DC
  Administered 2018-05-29 – 2018-06-01 (×7): 150 mg via ORAL
  Filled 2018-05-28 (×8): qty 1

## 2018-05-28 MED ORDER — MAGNESIUM HYDROXIDE 400 MG/5ML PO SUSP: 30.0000 mL | Freq: Every day | ORAL | Status: DC | PRN

## 2018-05-28 MED ORDER — SENNA 8.6 MG PO TABS
1.0000 | ORAL_TABLET | Freq: Two times a day (BID) | ORAL | Status: DC
  Administered 2018-05-28 – 2018-06-01 (×8): 8.6 mg via ORAL
  Filled 2018-05-28 (×7): qty 1

## 2018-05-28 MED ORDER — ONDANSETRON HCL 4 MG/2ML IJ SOLN
INTRAMUSCULAR | Status: DC | PRN
  Administered 2018-05-28: 4 mg via INTRAVENOUS

## 2018-05-28 MED ORDER — HYDROMORPHONE HCL 1 MG/ML IJ SOLN
INTRAMUSCULAR | Status: AC
  Filled 2018-05-28: qty 1

## 2018-05-28 MED ORDER — DEXAMETHASONE SODIUM PHOSPHATE 10 MG/ML IJ SOLN
INTRAMUSCULAR | Status: AC
  Filled 2018-05-28: qty 1

## 2018-05-28 MED ORDER — LEVOTHYROXINE SODIUM 150 MCG PO TABS
150.0000 ug | ORAL_TABLET | Freq: Every day | ORAL | Status: DC
  Filled 2018-05-28: qty 1

## 2018-05-28 MED ORDER — PENTOXIFYLLINE ER 400 MG PO TBCR
400.0000 mg | EXTENDED_RELEASE_TABLET | Freq: Three times a day (TID) | ORAL | Status: DC
Start: 2018-05-29 — End: 2018-06-01
  Administered 2018-05-29 – 2018-06-01 (×11): 400 mg via ORAL
  Filled 2018-05-28 (×13): qty 1

## 2018-05-28 MED ORDER — LIDOCAINE 2% (20 MG/ML) 5 ML SYRINGE
INTRAMUSCULAR | Status: DC | PRN
  Administered 2018-05-28: 100 mg via INTRAVENOUS

## 2018-05-28 MED ORDER — PROMETHAZINE HCL 25 MG/ML IJ SOLN: 6.2500 mg | INTRAMUSCULAR | Status: DC | PRN

## 2018-05-28 MED ORDER — INSULIN PEN NEEDLE 32G X 4 MM MISC: 0 refills | Status: DC

## 2018-05-28 MED ORDER — FUROSEMIDE 20 MG PO TABS: 20.0000 mg | ORAL_TABLET | Freq: Every day | ORAL | Status: DC

## 2018-05-28 MED ORDER — MIDAZOLAM HCL 2 MG/2ML IJ SOLN
INTRAMUSCULAR | Status: AC
  Filled 2018-05-28: qty 2

## 2018-05-28 MED ORDER — SUCCINYLCHOLINE CHLORIDE 20 MG/ML IJ SOLN
INTRAMUSCULAR | Status: DC | PRN
  Administered 2018-05-28: 100 mg via INTRAVENOUS

## 2018-05-28 MED ORDER — MAGNESIUM OXIDE 400 (241.3 MG) MG PO TABS
400.0000 mg | ORAL_TABLET | Freq: Every day | ORAL | Status: DC
  Administered 2018-05-29 – 2018-06-01 (×4): 400 mg via ORAL
  Filled 2018-05-28 (×4): qty 1

## 2018-05-28 MED ORDER — ATORVASTATIN CALCIUM 80 MG PO TABS
80.0000 mg | ORAL_TABLET | Freq: Every day | ORAL | Status: DC
  Administered 2018-05-28 – 2018-05-31 (×4): 80 mg via ORAL
  Filled 2018-05-28 (×5): qty 1

## 2018-05-28 MED ORDER — 0.9 % SODIUM CHLORIDE (POUR BTL) OPTIME
TOPICAL | Status: DC | PRN
  Administered 2018-05-28: 1000 mL

## 2018-05-28 MED ORDER — OXYCODONE HCL 5 MG PO TABS
ORAL_TABLET | ORAL | Status: AC
  Administered 2018-05-29: 5 mg via ORAL
  Filled 2018-05-28: qty 2

## 2018-05-28 MED ORDER — CEFAZOLIN SODIUM-DEXTROSE 2-4 GM/100ML-% IV SOLN
INTRAVENOUS | Status: AC
  Filled 2018-05-28: qty 100

## 2018-05-28 MED ORDER — GLIPIZIDE ER 10 MG PO TB24
10.0000 mg | ORAL_TABLET | Freq: Every day | ORAL | Status: DC
  Administered 2018-05-29: 10 mg via ORAL
  Filled 2018-05-28: qty 1

## 2018-05-28 MED ORDER — SORBITOL 70 % SOLN
30.0000 mL | Freq: Every day | Status: DC | PRN
  Administered 2018-05-31: 30 mL via ORAL
  Filled 2018-05-28: qty 30

## 2018-05-28 MED ORDER — GABAPENTIN 300 MG PO CAPS
300.0000 mg | ORAL_CAPSULE | Freq: Three times a day (TID) | ORAL | Status: DC
  Administered 2018-05-28 – 2018-06-01 (×12): 300 mg via ORAL
  Filled 2018-05-28 (×12): qty 1

## 2018-05-28 MED ORDER — HYDROMORPHONE HCL 1 MG/ML IJ SOLN
0.2500 mg | INTRAMUSCULAR | Status: DC | PRN
  Administered 2018-05-28 (×3): 0.5 mg via INTRAVENOUS

## 2018-05-28 MED ORDER — DOCUSATE SODIUM 100 MG PO CAPS
100.0000 mg | ORAL_CAPSULE | Freq: Every day | ORAL | Status: DC
  Administered 2018-05-29 – 2018-06-01 (×4): 100 mg via ORAL
  Filled 2018-05-28 (×4): qty 1

## 2018-05-28 MED ORDER — PHENYLEPHRINE 40 MCG/ML (10ML) SYRINGE FOR IV PUSH (FOR BLOOD PRESSURE SUPPORT)
PREFILLED_SYRINGE | INTRAVENOUS | Status: AC
  Filled 2018-05-28: qty 20

## 2018-05-28 SURGICAL SUPPLY — 58 items
BANDAGE ACE 6X5 VEL STRL LF (GAUZE/BANDAGES/DRESSINGS) IMPLANT
BANDAGE ELASTIC 6 VELCRO ST LF (GAUZE/BANDAGES/DRESSINGS) ×2 IMPLANT
BANDAGE ESMARK 6X9 LF (GAUZE/BANDAGES/DRESSINGS) ×1 IMPLANT
BLADE SAW RECIP 87.9 MT (BLADE) ×2 IMPLANT
BNDG COHESIVE 6X5 TAN STRL LF (GAUZE/BANDAGES/DRESSINGS) ×2 IMPLANT
BNDG ELASTIC 6X15 VLCR STRL LF (GAUZE/BANDAGES/DRESSINGS) ×2 IMPLANT
BNDG ESMARK 6X9 LF (GAUZE/BANDAGES/DRESSINGS) ×2
CHLORAPREP W/TINT 26ML (MISCELLANEOUS) ×2 IMPLANT
COVER SURGICAL LIGHT HANDLE (MISCELLANEOUS) ×2 IMPLANT
CUFF TOURNIQUET SINGLE 34IN LL (TOURNIQUET CUFF) ×2 IMPLANT
CUFF TOURNIQUET SINGLE 44IN (TOURNIQUET CUFF) IMPLANT
DRAPE EXTREMITY T 121X128X90 (DRAPE) ×2 IMPLANT
DRAPE HALF SHEET 40X57 (DRAPES) ×2 IMPLANT
DRAPE IMP U-DRAPE 54X76 (DRAPES) ×2 IMPLANT
DRAPE INCISE IOBAN 66X45 STRL (DRAPES) ×2 IMPLANT
DRAPE U-SHAPE 47X51 STRL (DRAPES) ×2 IMPLANT
DRSG MEPITEL 4X7.2 (GAUZE/BANDAGES/DRESSINGS) ×2 IMPLANT
DRSG PAD ABDOMINAL 8X10 ST (GAUZE/BANDAGES/DRESSINGS) ×6 IMPLANT
ELECT CAUTERY BLADE 6.4 (BLADE) ×2 IMPLANT
ELECT REM PT RETURN 9FT ADLT (ELECTROSURGICAL) ×2
ELECTRODE REM PT RTRN 9FT ADLT (ELECTROSURGICAL) ×1 IMPLANT
EVACUATOR 1/8 PVC DRAIN (DRAIN) IMPLANT
GAUZE SPONGE 4X4 12PLY STRL (GAUZE/BANDAGES/DRESSINGS) ×2 IMPLANT
GLOVE BIO SURGEON STRL SZ8 (GLOVE) ×4 IMPLANT
GLOVE BIOGEL PI IND STRL 7.5 (GLOVE) ×1 IMPLANT
GLOVE BIOGEL PI IND STRL 8 (GLOVE) ×1 IMPLANT
GLOVE BIOGEL PI INDICATOR 7.5 (GLOVE) ×1
GLOVE BIOGEL PI INDICATOR 8 (GLOVE) ×1
GLOVE ECLIPSE 8.0 STRL XLNG CF (GLOVE) ×2 IMPLANT
GOWN STRL REUS W/ TWL LRG LVL3 (GOWN DISPOSABLE) ×1 IMPLANT
GOWN STRL REUS W/ TWL XL LVL3 (GOWN DISPOSABLE) ×1 IMPLANT
GOWN STRL REUS W/TWL LRG LVL3 (GOWN DISPOSABLE) ×1
GOWN STRL REUS W/TWL XL LVL3 (GOWN DISPOSABLE) ×1
KIT BASIN OR (CUSTOM PROCEDURE TRAY) ×2 IMPLANT
KIT TURNOVER KIT B (KITS) ×2 IMPLANT
MANIFOLD NEPTUNE II (INSTRUMENTS) IMPLANT
NS IRRIG 1000ML POUR BTL (IV SOLUTION) ×2 IMPLANT
PACK GENERAL/GYN (CUSTOM PROCEDURE TRAY) ×2 IMPLANT
PAD ABD 8X10 STRL (GAUZE/BANDAGES/DRESSINGS) ×2 IMPLANT
PAD ARMBOARD 7.5X6 YLW CONV (MISCELLANEOUS) ×2 IMPLANT
PAD CAST 4YDX4 CTTN HI CHSV (CAST SUPPLIES) ×1 IMPLANT
PADDING CAST COTTON 4X4 STRL (CAST SUPPLIES) ×1
PADDING CAST COTTON 6X4 STRL (CAST SUPPLIES) ×2 IMPLANT
SPONGE LAP 18X18 X RAY DECT (DISPOSABLE) IMPLANT
STAPLER VISISTAT 35W (STAPLE) IMPLANT
STOCKINETTE IMPERVIOUS LG (DRAPES) ×2 IMPLANT
SUT ETHILON 2 0 FS 18 (SUTURE) ×4 IMPLANT
SUT MNCRL AB 3-0 PS2 18 (SUTURE) ×6 IMPLANT
SUT PDS AB 1 CT  36 (SUTURE) ×1
SUT PDS AB 1 CT 36 (SUTURE) ×1 IMPLANT
SUT SILK 2 0 (SUTURE) ×1
SUT SILK 2-0 18XBRD TIE 12 (SUTURE) ×1 IMPLANT
SWAB COLLECTION DEVICE MRSA (MISCELLANEOUS) IMPLANT
SWAB CULTURE ESWAB REG 1ML (MISCELLANEOUS) IMPLANT
TOWEL OR 17X24 6PK STRL BLUE (TOWEL DISPOSABLE) IMPLANT
TOWEL OR 17X26 10 PK STRL BLUE (TOWEL DISPOSABLE) ×2 IMPLANT
WATER STERILE IRR 1000ML POUR (IV SOLUTION) IMPLANT
YANKAUER SUCT BULB TIP NO VENT (SUCTIONS) ×2 IMPLANT

## 2018-05-28 NOTE — Op Note (Signed)
05/28/2018  3:33 PM  PATIENT:  Steven Maldonado  77 y.o. male  PRE-OPERATIVE DIAGNOSIS:  Left foot gangrene  POST-OPERATIVE DIAGNOSIS:  Left foot gangrene  Procedure(s):  LEFT AMPUTATION BELOW KNEE  SURGEON:  Wylene Simmer, MD  ASSISTANT:  n/a  ANESTHESIA:   General  EBL:  minimal   TOURNIQUET:   Total Tourniquet Time Documented: Thigh (Left) - 36 minutes Total: Thigh (Left) - 36 minutes  COMPLICATIONS:  None apparent  DISPOSITION:  Extubated, awake and stable to recovery.  INDICATION FOR PROCEDURE: The patient is a 77 year old male with past medical history significant for diabetes and peripheral vascular disease.  He is status post third ray amputation several weeks ago and has progressed to frank gangrene of his left forefoot.  There is an adequate soft tissue to allow for transmetatarsal amputation.  He presents now for left below-knee amputation.  The risks and benefits of the alternative treatment options have been discussed in detail.  The patient wishes to proceed with surgery and specifically understands risks of bleeding, infection, nerve damage, blood clots, need for additional surgery, amputation and death.  PROCEDURE IN DETAIL:  After pre operative consent was obtained, and the correct operative site was identified, the patient was brought to the operating room and placed supine on the OR table.  Anesthesia was administered.  Pre-operative antibiotics were administered.  A surgical timeout was taken.  The left lower extremity was prepped and draped in standard sterile fashion with a tourniquet around the thigh.  The extremity was elevated and the tourniquet was inflated to 250 mmHg.  A posterior flap incision was marked on the skin 15 cm distal from the medial joint line of the knee.  The incision was made circumferentially and dissection was carried down anteriorly to the level of the periosteum.  The periosteum was elevated.  The anterior compartment musculature was divided.   The tibia was cut with the reciprocating saw just proximal from the level of the skin incision.  Care was taken to protect the surrounding soft tissues.  The fibula was then dissected and cut approximately 1 cm proximal from the tibial cut.  The leg was then retracted anteriorly and an amputation knife was used to cut through the posterior compartment musculature contouring the flap appropriately.  The distal portion of the lower extremity was passed off the field as a specimen to pathology.  The named vascular bundles were suture ligated with 2-0 silk ties.  The named nerve structures were divided sharply while held under traction.  The cut surface of the tibia was smoothed with a rasp.  The wound was then irrigated copiously.  The gastrocnemius fascia was repaired to the anterior tibial periosteum with imbricating sutures of 0 PDS.  The subcutaneous tissues were approximated with inverted simple sutures of 3-0 Monocryl.  Horizontal mattress sutures of 2-0 nylon were used to close the skin incision.  Sterile dressings were applied followed by a compression wrap.  The tourniquet was released after application of the dressings at 36 minutes.  The patient was awakened from anesthesia and transported to the recovery room in stable condition.  FOLLOW UP PLAN: The patient will be admitted for physical therapy and occupational therapy as well as discharge planning.  He will be nonweightbearing on the left lower extremity.  Hospitalist consultation will be obtained for management of his medical comorbidities.  He will restart Pradaxa tomorrow.

## 2018-05-28 NOTE — Anesthesia Procedure Notes (Signed)
Procedure Name: Intubation Date/Time: 05/28/2018 2:40 PM Performed by: White, Amedeo Plenty, CRNA Pre-anesthesia Checklist: Patient identified, Emergency Drugs available, Suction available and Patient being monitored Patient Re-evaluated:Patient Re-evaluated prior to induction Oxygen Delivery Method: Circle System Utilized Preoxygenation: Pre-oxygenation with 100% oxygen Induction Type: IV induction Ventilation: Mask ventilation without difficulty Laryngoscope Size: Mac and 4 Grade View: Grade I Tube type: Oral Tube size: 7.5 mm Number of attempts: 1 Airway Equipment and Method: Stylet Placement Confirmation: ETT inserted through vocal cords under direct vision,  positive ETCO2 and breath sounds checked- equal and bilateral Secured at: 24 cm Tube secured with: Tape Dental Injury: Teeth and Oropharynx as per pre-operative assessment

## 2018-05-28 NOTE — Transfer of Care (Signed)
Immediate Anesthesia Transfer of Care Note  Patient: Steven Maldonado  Procedure(s) Performed: LEFT AMPUTATION BELOW KNEE (Left Leg Lower)  Patient Location: PACU  Anesthesia Type:General  Level of Consciousness: awake, alert  and patient cooperative  Airway & Oxygen Therapy: Patient Spontanous Breathing  Post-op Assessment: Report given to RN and Post -op Vital signs reviewed and stable  Post vital signs: Reviewed and stable  Last Vitals:  Vitals Value Taken Time  BP 161/66 05/28/2018  3:39 PM  Temp    Pulse 80 05/28/2018  3:39 PM  Resp 16 05/28/2018  3:39 PM  SpO2 97 % 05/28/2018  3:39 PM    Last Pain:  Vitals:   05/28/18 1318  TempSrc:   PainSc: 0-No pain         Complications: No apparent anesthesia complications

## 2018-05-28 NOTE — H&P (Signed)
Steven Maldonado is an 77 y.o. male.   Chief Complaint: Left foot gangrene HPI: The patient is a 77 year old male with a past medical history significant for diabetes, heart failure, peripheral vascular disease and previous left third ray amputation.  He had surgery on the left foot a few weeks ago by Dr. Sharol Given.  That wound did not heal and progressed to frank gangrene of the forefoot.  There is an adequate soft tissue to allow transmetatarsal amputation.  He presents now for left below-knee amputation.  He last took Pradaxa on Monday.  He is on oral Bactrim.  Past Medical History:  Diagnosis Date  . Chronic diastolic CHF (congestive heart failure) ([place])   . CKD (chronic kidney disease), stage III ([place])   . Constipation   . Coronary artery disease    a. Cath February 2012 in [place], occluded RCA with collaterals  . DM type 2 (diabetes mellitus, type 2) ([place])   . Essential hypertension   . Hyperlipidemia   . Neuropathy    feet  . Pacemaker    a. symptomatic brady after TAVR s/p MDT PPM by Dr. Curt Bears 12/04/17  . Persistent atrial fibrillation ([place])   . PONV (postoperative nausea and vomiting)    after valve surgery  . PVD (peripheral vascular disease) ([place])    a. s/p R popliteal artery stenosis tx with drug-coated balloon 05/2014, followed by Dr. Fletcher Anon.  . 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 Severe aortic stenosis    a. 12/02/17: s/p TAVR  . Skin cancer   . Sleep apnea with use of continuous positive airway pressure (CPAP)    04-11-11 AHI was 32.9 and titrated to 15 cm H20, DME is [facility]  . Subclinical hypothyroidism     Past Surgical History:  Procedure Laterality Date  . ABDOMINAL ANGIOGRAM N/A 06/08/2014   Procedure: ABDOMINAL ANGIOGRAM;  Surgeon: Wellington Hampshire, MD;  Location: [facility] CATH LAB;  Service: Cardiovascular;  Laterality: N/A;  . ABDOMINAL  AORTOGRAM N/A 04/09/2018   Procedure: ABDOMINAL AORTOGRAM;  Surgeon: Conrad [place], MD;  Location: [place] CV LAB;  Service: Cardiovascular;  Laterality: N/A;  . AMPUTATION Left 04/17/2018   Procedure: LEFT FOOT 3RD RAY AMPUTATION;  Surgeon: Newt Minion, MD;  Location: [place];  Service: Orthopedics;  Laterality: Left;  . APPENDECTOMY  1965  . CARDIAC CATHETERIZATION  12/2010  . CARDIOVERSION  07/2011  . CARDIOVERSION N/A 04/18/2014   Procedure: CARDIOVERSION;  Surgeon: Dorothy Spark, MD;  Location: [place];  Service: Cardiovascular;  Laterality: N/A;  . CARDIOVERSION N/A 11/03/2015   Procedure: CARDIOVERSION;  Surgeon: Lelon Perla, MD;  Location: [facility] ENDOSCOPY;  Service: Cardiovascular;  Laterality: N/A;  . CARDIOVERSION N/A 05/08/2017   Procedure: CARDIOVERSION;  Surgeon: Dorothy Spark, MD;  Location: [facility] ENDOSCOPY;  Service: Cardiovascular;  Laterality: N/A;  . CARDIOVERSION N/A 07/28/2017   Procedure: CARDIOVERSION;  Surgeon: Dorothy Spark, MD;  Location: [place];  Service: Cardiovascular;  Laterality: N/A;  . LOWER EXTREMITY ANGIOGRAM N/A 06/08/2014   Procedure: LOWER EXTREMITY ANGIOGRAM;  Surgeon: Wellington Hampshire, MD;  Location: [facility] CATH LAB;  Service: Cardiovascular;  Laterality: N/A;  . LOWER EXTREMITY ANGIOGRAPHY Left 04/09/2018   Procedure: Lower Extremity Angiography;  Surgeon: Conrad Bazile Mills, MD;  Location: [place] CV LAB;  Service: Cardiovascular;  Laterality: Left;  . PACEMAKER IMPLANT N/A 12/04/2017  Procedure: PACEMAKER IMPLANT;  Surgeon: Constance Haw, MD;  Location: [place] CV LAB;  Service: Cardiovascular;  Laterality: N/A;  . PERIPHERAL VASCULAR BALLOON ANGIOPLASTY Left 04/09/2018   Procedure: PERIPHERAL VASCULAR BALLOON ANGIOPLASTY;  Surgeon: Conrad [place], MD;  Location: [place] CV LAB;  Service: Cardiovascular;  Laterality: Left;  SFA  . POPLITEAL ARTERY ANGIOPLASTY Right 06/08/2014   Archie Endo 06/08/2014  . RIGHT/LEFT HEART CATH AND  CORONARY ANGIOGRAPHY N/A 10/08/2017   Procedure: RIGHT/LEFT HEART CATH AND CORONARY ANGIOGRAPHY;  Surgeon: Burnell Blanks, MD;  Location: [place] CV LAB;  Service: Cardiovascular;  Laterality: N/A;  . SKIN CANCER EXCISION Bilateral    "have had them cut off back of neck X 2; off left upper arm; right wrist, near right shoulder blade" (06/08/2014)  . TEE WITHOUT CARDIOVERSION N/A 12/02/2017   Procedure: TRANSESOPHAGEAL ECHOCARDIOGRAM (TEE);  Surgeon: Burnell Blanks, MD;  Location: [place];  Service: Open Heart Surgery;  Laterality: N/A;  . TEMPORARY PACEMAKER N/A 12/04/2017   Procedure: TEMPORARY PACEMAKER;  Surgeon: Leonie Man, MD;  Location: [place] CV LAB;  Service: Cardiovascular;  Laterality: N/A;  . TRANSCATHETER AORTIC VALVE REPLACEMENT, TRANSFEMORAL N/A 12/02/2017   Procedure: TRANSCATHETER AORTIC VALVE REPLACEMENT, TRANSFEMORAL;  Surgeon: Burnell Blanks, MD;  Location: [place];  Service: Open Heart Surgery;  Laterality: N/A;  using Edwards Sapien 3 Transcatheter Heart Valve size 40mm    Family History  Problem Relation Age of Onset  . Diabetes Mother   . Heart attack Mother   . Hypertension Mother   . Heart attack Father   . Heart failure Father   . Hypertension Father   . Diabetes Father   . Diabetes Sister   . Diabetes Brother   . Diabetes Other   . Diabetes Daughter        TYPE ll  . Heart Problems Daughter   . Hypertension Sister   . Hypertension Brother   . Stroke Brother    Social History:  reports that he quit smoking about 45 years ago. His smoking use included cigarettes. He has a 28.00 pack-year smoking history. He has never used smokeless tobacco. He reports that he does not drink alcohol or use drugs.  Allergies: No Known Allergies  Medications Prior to Admission  Medication Sig Dispense Refill  . acetaminophen (TYLENOL) 500 MG tablet Take 1,000 mg by mouth at bedtime.    [name] Kitchen amiodarone (PACERONE) 200 MG tablet Take 1 tablet  (200 mg total) by mouth daily. 90 tablet 3  . aspirin EC 81 MG tablet Take 81 mg by mouth daily after breakfast.     . atorvastatin (LIPITOR) 80 MG tablet Take 80 mg by mouth at bedtime.    [name] Kitchen CINNAMON PO Take 3,000 mg by mouth daily.     . clotrimazole (LOTRIMIN) 1 % cream Apply 1 application topically 2 (two) times daily. 30 g 0  . dabigatran (PRADAXA) 150 MG CAPS capsule Take 150 mg by mouth every 12 (twelve) hours.     . docusate sodium (COLACE) 100 MG capsule Take 100 mg by mouth daily after breakfast.     . furosemide (LASIX) 20 MG tablet Take 1 tablet (20 mg total) by mouth daily. 90 tablet 3  . glipiZIDE (GLUCOTROL XL) 10 MG 24 hr tablet TAKE 1 TABLET (10 MG TOTAL) BY MOUTH DAILY WITH BREAKFAST. 30 tablet 5  . Insulin Glargine (BASAGLAR KWIKPEN) 100 UNIT/ML SOPN Inject 0.65 mLs (65 Units total) into the skin at bedtime. [place]  mL 0  . insulin lispro (HUMALOG) 100 UNIT/ML KiwkPen Inject 0.05 mLs (5 Units total) into the skin 3 (three) times daily. 15 mL 3  . Insulin Pen Needle (BD PEN NEEDLE NANO U/F) 32G X 4 MM MISC USE 4 (FOUR) TIMES DAILY. 1000 each 0  . magnesium oxide (MAG-OX) 400 MG tablet Take 400 mg by mouth daily after breakfast.     . metoprolol tartrate (LOPRESSOR) 50 MG tablet Take 75 mg by mouth 2 (two) times daily.    [name] Kitchen MYRBETRIQ 25 MG TB24 tablet TAKE 1 TABLET BY MOUTH EVERY DAY 90 tablet 0  . nitroGLYCERIN (NITRODUR - DOSED IN MG/24 HR) 0.2 mg/hr patch Place 1 patch (0.2 mg total) onto the skin daily. 30 patch 12  . pentoxifylline (TRENTAL) 400 MG CR tablet Take 1 tablet (400 mg total) by mouth 3 (three) times daily with meals. 90 tablet 3  . sulfamethoxazole-trimethoprim (BACTRIM DS,SEPTRA DS) 800-160 MG tablet Take 1 tablet by mouth daily. 28 tablet 0  . Blood Glucose Monitoring Suppl (FREESTYLE LITE) DEVI 1 each by Does not apply route 2 (two) times daily. (Patient not taking: Reported on 05/25/2018) 1 each 0  . gabapentin (NEURONTIN) 300 MG capsule Take 1 capsule (300 mg  total) by mouth 3 (three) times daily. (Patient not taking: Reported on 05/25/2018) 90 capsule 5  . glucose blood (FREESTYLE LITE) test strip 1 each by Other route 4 (four) times daily -  before meals and at bedtime. (Patient not taking: Reported on 05/25/2018) 450 each 3  . HYDROcodone-acetaminophen (NORCO/VICODIN) 5-325 MG tablet Take 1 tablet by mouth every 4 (four) hours as needed for moderate pain. (Patient not taking: Reported on 05/25/2018) 30 tablet 0  . levothyroxine (SYNTHROID, LEVOTHROID) 150 MCG tablet TAKE 1 TABLET (150 MCG TOTAL) BY MOUTH DAILY. TAKE FOR 3 WEEKS THEN FOLLOW UP WITH PROVIDER (Patient not taking: Reported on 05/25/2018) 21 tablet 0    Results for orders placed or performed during the hospital encounter of 05/28/18 (from the past 48 hour(s))  Glucose, capillary     Status: Abnormal   Collection Time: 05/28/18  1:10 PM  Result Value Ref Range   Glucose-Capillary 137 (H) 70 - 99 mg/dL   No results found.  ROS no recent fever, chills, nausea, vomiting or changes in his appetite  Blood pressure (!) 142/59, pulse 63, temperature 98.1 F (36.7 C), temperature source Oral, resp. rate 18, height 5\' 11"  (1.803 m), weight 115.2 kg (254 lb), SpO2 97 %. Physical Exam  Well-nourished well-developed man in no apparent distress.  Alert and oriented x4.  Mood and affect are normal.  Extraocular motions are intact.  Respirations are unlabored.  His gait is heel weightbearing on the left.  The left foot has an area of central gangrene extending to the level of the midfoot involving the previous third toe amputation site as well as the second and fourth rays.  No palpable pulses in the foot but there is brisk capillary refill at the toes.  5 out of 5 strength in plantar flexion and dorsi flexion of the ankle.  No lymphadenopathy.  Sensibility to light touch is diminished at the forefoot.  Assessment/Plan Left foot gangrene -to the operating room for below-knee amputation.  The risks and  benefits of the alternative treatment options have been discussed in detail.  The patient wishes to proceed with surgery and specifically understands risks of bleeding, infection, nerve damage, blood clots, need for additional surgery, amputation and death.   Darryel Diodato,  Jenny Reichmann, MD 05/28/2018, 2:07 PM

## 2018-05-28 NOTE — Anesthesia Postprocedure Evaluation (Signed)
Anesthesia Post Note  Patient: Steven Maldonado  Procedure(s) Performed: LEFT AMPUTATION BELOW KNEE (Left Leg Lower)     Patient location during evaluation: PACU Anesthesia Type: General Level of consciousness: awake and alert Pain management: pain level controlled Vital Signs Assessment: post-procedure vital signs reviewed and stable Respiratory status: spontaneous breathing, nonlabored ventilation, respiratory function stable and patient connected to nasal cannula oxygen Cardiovascular status: blood pressure returned to baseline and stable Postop Assessment: no apparent nausea or vomiting Anesthetic complications: no    Last Vitals:  Vitals:   05/28/18 1303 05/28/18 1539  BP: (!) 142/59 (!) 161/66  Pulse: 63 80  Resp: 18 16  Temp: 36.7 C 36.6 C  SpO2: 97% 97%    Last Pain:  Vitals:   05/28/18 1539  TempSrc:   PainSc: 0-No pain                 Delisa Finck S

## 2018-05-28 NOTE — Anesthesia Preprocedure Evaluation (Addendum)
Anesthesia Evaluation  Patient identified by MRN, date of birth, ID band Patient awake    Reviewed: Allergy & Precautions, H&P , NPO status , Patient's Chart, lab work & pertinent test results  History of Anesthesia Complications (+) PONV and history of anesthetic complications  Airway Mallampati: II  TM Distance: >3 FB Neck ROM: full    Dental  (+) Dental Advisory Given, Edentulous Upper, Edentulous Lower   Pulmonary sleep apnea , former smoker,    breath sounds clear to auscultation       Cardiovascular hypertension, + CAD, + Past MI, + Peripheral Vascular Disease and +CHF  + dysrhythmias Atrial Fibrillation + Valvular Problems/Murmurs AS  Rhythm:regular Rate:Normal  Echo 2/19 Study Conclusions  - Left ventricle: Inferobasal hypokinesis The cavity size was   normal. Wall thickness was increased in a pattern of severe LVH.   Systolic function was normal. The estimated ejection fraction was   in the range of 55% to 60%. - Aortic valve: Post TAVR with no significant peri valvualr   regurgitation gradients have increased since 12/03/17. - Mitral valve: Severely calcified annulus. Moderately thickened,   moderately calcified    Neuro/Psych    GI/Hepatic   Endo/Other  diabetes, Type 2Hypothyroidism Morbid obesity  Renal/GU Renal InsufficiencyRenal disease     Musculoskeletal   Abdominal   Peds  Hematology   Anesthesia Other Findings   Reproductive/Obstetrics                            Anesthesia Physical  Anesthesia Plan  ASA: III  Anesthesia Plan: General   Post-op Pain Management:    Induction: Intravenous  PONV Risk Score and Plan: 4 or greater and Ondansetron, Dexamethasone, Treatment may vary due to age or medical condition, Scopolamine patch - Pre-op and Diphenhydramine  Airway Management Planned: LMA  Additional Equipment:   Intra-op Plan:   Post-operative Plan:  Extubation in OR  Informed Consent: I have reviewed the patients History and Physical, chart, labs and discussed the procedure including the risks, benefits and alternatives for the proposed anesthesia with the patient or authorized representative who has indicated his/her understanding and acceptance.   Dental advisory given  Plan Discussed with: CRNA, Anesthesiologist and Surgeon  Anesthesia Plan Comments:        Anesthesia Quick Evaluation

## 2018-05-28 NOTE — Plan of Care (Signed)
  Problem: Education: Goal: Knowledge of General Education information will improve Description: Including pain rating scale, medication(s)/side effects and non-pharmacologic comfort measures Outcome: Progressing   Problem: Health Behavior/Discharge Planning: Goal: Ability to manage health-related needs will improve Outcome: Progressing   Problem: Nutrition: Goal: Adequate nutrition will be maintained Outcome: Progressing   Problem: Elimination: Goal: Will not experience complications related to bowel motility Outcome: Progressing Goal: Will not experience complications related to urinary retention Outcome: Progressing   Problem: Pain Managment: Goal: General experience of comfort will improve Outcome: Progressing   

## 2018-05-28 NOTE — Consult Note (Signed)
Medical Consultation   TREAVOR Maldonado  QAS:[id number]  DOB: May 11, 1941  DOA: 05/28/2018  PCP: Rennis Golden   Outpatient Specialists: Doran Durand - orthopedics; Bridgett Larsson - vascularAngelena Form - cardiology; Krista Blue - neurology   Requesting physician: Doran Durand, orthopedics  Reason for consultation: S/p BKA.  Ortho is admitting.  Requests consult for medical management.  No antibiotics.  Restarted Pradaxa.  Glycemic control.  He is on a large number of diabetes medications.   History of Present Illness: Steven Maldonado is an 77 y.o. male with h/o hypothyroidism; OSA on CPAP; s/p TAVR; pacemaker placement; PVD; afib on Pradaxa; HTN; HLD; CAD; DM; stage 3 CKD; and chronic diastolic heart failure presenting for BKA resulting from gangrene.  He was admitted to the [facility] service 5/24-28 for cellulitis with gangrene of the 3rd digit and has progressive disease since.  Foot salvage intervention was not felt to be efficacious and so he was scheduled for a left BKA today.  The surgery was uneventful and the patient reported throbbing pain while in PACU of the left stump but otherwise had no concerns.    Review of Systems:  ROS As per HPI otherwise 10 point review of systems negative.    Past Medical History: Past Medical History:  Diagnosis Date  . Chronic diastolic CHF (congestive heart failure) ([place])   . CKD (chronic kidney disease), stage III ([place])   . Constipation   . Coronary artery disease    a. Cath February 2012 in [place], occluded RCA with collaterals  . DM type 2 (diabetes mellitus, type 2) ([place])   . Essential hypertension   . Hyperlipidemia   . Neuropathy    feet  . Pacemaker    a. symptomatic brady after TAVR s/p MDT PPM by Dr. Curt Bears 12/04/17  . Persistent atrial fibrillation ([place])   . PONV (postoperative nausea and vomiting)    after valve surgery  . PVD (peripheral vascular disease) ([place])    a. s/p R popliteal artery stenosis tx with drug-coated balloon 05/2014,  followed by Dr. Fletcher Anon.  . 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 Severe aortic stenosis    a. 12/02/17: s/p TAVR  . Skin cancer   . Sleep apnea with use of continuous positive airway pressure (CPAP)    04-11-11 AHI was 32.9 and titrated to 15 cm H20, DME is [facility]  . Subclinical hypothyroidism     Past Surgical History: Past Surgical History:  Procedure Laterality Date  . ABDOMINAL ANGIOGRAM N/A 06/08/2014   Procedure: ABDOMINAL ANGIOGRAM;  Surgeon: Wellington Hampshire, MD;  Location: [facility] CATH LAB;  Service: Cardiovascular;  Laterality: N/A;  . ABDOMINAL AORTOGRAM N/A 04/09/2018   Procedure: ABDOMINAL AORTOGRAM;  Surgeon: Conrad Center Line, MD;  Location: [place] CV LAB;  Service: Cardiovascular;  Laterality: N/A;  . AMPUTATION Left 04/17/2018   Procedure: LEFT FOOT 3RD RAY AMPUTATION;  Surgeon: Newt Minion, MD;  Location: [place];  Service: Orthopedics;  Laterality: Left;  . APPENDECTOMY  1965  . CARDIAC CATHETERIZATION  12/2010  . CARDIOVERSION  07/2011  . CARDIOVERSION N/A 04/18/2014   Procedure: CARDIOVERSION;  Surgeon: Dorothy Spark, MD;  Location: [facility] ENDOSCOPY;  Service: Cardiovascular;  Laterality: N/A;  . CARDIOVERSION N/A 11/03/2015   Procedure: CARDIOVERSION;  Surgeon: Lelon Perla, MD;  Location:  [place] ENDOSCOPY;  Service: Cardiovascular;  Laterality: N/A;  . CARDIOVERSION N/A 05/08/2017   Procedure: CARDIOVERSION;  Surgeon: Dorothy Spark, MD;  Location: [facility] ENDOSCOPY;  Service: Cardiovascular;  Laterality: N/A;  . CARDIOVERSION N/A 07/28/2017   Procedure: CARDIOVERSION;  Surgeon: Dorothy Spark, MD;  Location: [place];  Service: Cardiovascular;  Laterality: N/A;  . LOWER EXTREMITY ANGIOGRAM N/A 06/08/2014   Procedure: LOWER EXTREMITY ANGIOGRAM;  Surgeon: Wellington Hampshire, MD;  Location: [place] CATH LAB;  Service: Cardiovascular;  Laterality: N/A;  .  LOWER EXTREMITY ANGIOGRAPHY Left 04/09/2018   Procedure: Lower Extremity Angiography;  Surgeon: Conrad St. Benedict, MD;  Location: [place] CV LAB;  Service: Cardiovascular;  Laterality: Left;  . PACEMAKER IMPLANT N/A 12/04/2017   Procedure: PACEMAKER IMPLANT;  Surgeon: Constance Haw, MD;  Location: [place] CV LAB;  Service: Cardiovascular;  Laterality: N/A;  . PERIPHERAL VASCULAR BALLOON ANGIOPLASTY Left 04/09/2018   Procedure: PERIPHERAL VASCULAR BALLOON ANGIOPLASTY;  Surgeon: Conrad , MD;  Location: [place] CV LAB;  Service: Cardiovascular;  Laterality: Left;  SFA  . POPLITEAL ARTERY ANGIOPLASTY Right 06/08/2014   Archie Endo 06/08/2014  . RIGHT/LEFT HEART CATH AND CORONARY ANGIOGRAPHY N/A 10/08/2017   Procedure: RIGHT/LEFT HEART CATH AND CORONARY ANGIOGRAPHY;  Surgeon: Burnell Blanks, MD;  Location: [place] CV LAB;  Service: Cardiovascular;  Laterality: N/A;  . SKIN CANCER EXCISION Bilateral    "have had them cut off back of neck X 2; off left upper arm; right wrist, near right shoulder blade" (06/08/2014)  . TEE WITHOUT CARDIOVERSION N/A 12/02/2017   Procedure: TRANSESOPHAGEAL ECHOCARDIOGRAM (TEE);  Surgeon: Burnell Blanks, MD;  Location: [place];  Service: Open Heart Surgery;  Laterality: N/A;  . TEMPORARY PACEMAKER N/A 12/04/2017   Procedure: TEMPORARY PACEMAKER;  Surgeon: Leonie Man, MD;  Location: [place] CV LAB;  Service: Cardiovascular;  Laterality: N/A;  . TRANSCATHETER AORTIC VALVE REPLACEMENT, TRANSFEMORAL N/A 12/02/2017   Procedure: TRANSCATHETER AORTIC VALVE REPLACEMENT, TRANSFEMORAL;  Surgeon: Burnell Blanks, MD;  Location: [place];  Service: Open Heart Surgery;  Laterality: N/A;  using Edwards Sapien 3 Transcatheter Heart Valve size 40mm     Allergies:  No Known Allergies   Social History:  reports that he quit smoking about 45 years ago. His smoking use included cigarettes. He has a 28.00 pack-year smoking history. He has never  used smokeless tobacco. He reports that he does not drink alcohol or use drugs.   Family History: Family History  Problem Relation Age of Onset  . Diabetes Mother   . Heart attack Mother   . Hypertension Mother   . Heart attack Father   . Heart failure Father   . Hypertension Father   . Diabetes Father   . Diabetes Sister   . Diabetes Brother   . Diabetes Other   . Diabetes Daughter        TYPE ll  . Heart Problems Daughter   . Hypertension Sister   . Hypertension Brother   . Stroke Brother       Physical Exam: Vitals:   05/28/18 1539 05/28/18 1554 05/28/18 1609 05/28/18 1624  BP: (!) 161/66 (!) 157/67 (!) 164/64 136/68  Pulse: 80 76 73 71  Resp: 16 10 14 14   Temp: 97.9 F (36.6 C)     TempSrc:      SpO2: 97% 97% 97% 94%  Weight:      Height:        Constitutional: Alert and awake, oriented x3,  not in any acute distress. Eyes: EOMI, irises appear normal, anicteric sclera,  ENMT: external ears and nose appear normal, normal hearing Neck: neck appears normal, no masses, normal ROM, no thyromegaly, no JVD  CVS: S1-S2 clear, no rubs or gallops, 2--3/6 murmur, no LE edema, normal pedal pulses on right Respiratory:  clear to auscultation bilaterally, no wheezing, rales or rhonchi. Respiratory effort normal. No accessory muscle use.  Abdomen: soft nontender, nondistended, normal bowel sounds, no hepatosplenomegaly, no hernias  Musculoskeletal: : no cyanosis, clubbing or edema noted bilaterally.   He is now s/p L BKA and the dressing is C/D/I. Neuro: Cranial nerves II-XII intact, strength Psych: judgement and insight appear normal, stable mood and affect, mental status Skin: no rashes or lesions or ulcers, no induration or nodules    Data reviewed:  I have personally reviewed the recent labs and imaging studies  Pertinent Labs:   BUN 20/Creatinine 2.00/GFR 31; 17/1.28/53 on 5/27, baseline 1.3-1.4 WBC 8.6 Hgb 11.3; 12.1 on 5/27   Inpatient Medications:     Scheduled Meds: . chlorhexidine  60 mL Topical Once  . HYDROmorphone      . HYDROmorphone      . oxyCODONE      . scopolamine  1 patch Transdermal Q72H   Continuous Infusions: . sodium chloride    . lactated ringers       Radiological Exams on Admission: No results found.  Impression/Recommendations Principal Problem:   S/P BKA (below knee amputation) unilateral, left (HCC) Active Problems:   Hyperlipidemia   Diabetes mellitus type 2, uncomplicated (HCC)   Chronic kidney disease   Hypothyroidism   Essential hypertension   PAF (paroxysmal atrial fibrillation) (HCC)   Obstructive sleep apnea treated with continuous positive airway pressure (CPAP)   Gangrene of left foot (HCC)  S/p L BKA for foot gangrene -Patient was brought in for planned BKA -He is doing well post-operatively -He will need PT/OT consults -He is likely to need placement; family prefers [facility].  Will request SW consult.  DM -A1c was 11.6 in 6/19 -He is on Glargine 65 units and glipizide -The glipizide is not likely highly effective at this point and it may be reasonable to discontinue -Cover with moderate-scale SSI -Consider titrating Lantus dose based on SSI requirements  Afib on Pradaxa, s/p TAVR -Rate controlled on Amiodarone, will continue -AC with Pradaxa, continued by orthopedics  OSA on CPAP -Continue CPAP -Will use home setting/autopap/RT assistance  HTN -Continue Lopressor -He does not appear to be taking ACE - if his renal function improves, would consider adding prior to d/c or as an outpatient  HLD -Continue Lipitor  AKI on stage 3 CKD -Hydrate with LR at 100 cc/hr x 10 hours -Hold Lasix and resume on 4/65  Chronic diastolic CHF -Appears to be compensated -2/19 Echo with preserved EF, severe LVH, and no comment about diastolic function  Hypothyroidism -TSH was 20.91 in 3/19 -No apparent repeat since -Will check TSH and free T4 -Continue Synthroid at current dose for  now  Thank you for this consultation.  Our [facility] hospitalist team will follow the patient with you.   Time Spent: 54 minutes  Karmen Bongo M.D. [facility] 05/28/2018, 5:02 PM

## 2018-05-29 ENCOUNTER — Ambulatory Visit (HOSPITAL_COMMUNITY): Payer: Medicare Other

## 2018-05-29 ENCOUNTER — Encounter (HOSPITAL_COMMUNITY): Payer: Self-pay | Admitting: Orthopedic Surgery

## 2018-05-29 DIAGNOSIS — E785 Hyperlipidemia, unspecified: Secondary | ICD-10-CM

## 2018-05-29 DIAGNOSIS — I480 Paroxysmal atrial fibrillation: Secondary | ICD-10-CM

## 2018-05-29 DIAGNOSIS — N183 Chronic kidney disease, stage 3 (moderate): Secondary | ICD-10-CM

## 2018-05-29 DIAGNOSIS — I10 Essential (primary) hypertension: Secondary | ICD-10-CM

## 2018-05-29 DIAGNOSIS — E119 Type 2 diabetes mellitus without complications: Secondary | ICD-10-CM

## 2018-05-29 DIAGNOSIS — Z89512 Acquired absence of left leg below knee: Secondary | ICD-10-CM

## 2018-05-29 DIAGNOSIS — Z794 Long term (current) use of insulin: Secondary | ICD-10-CM

## 2018-05-29 DIAGNOSIS — Z9989 Dependence on other enabling machines and devices: Secondary | ICD-10-CM

## 2018-05-29 DIAGNOSIS — I96 Gangrene, not elsewhere classified: Secondary | ICD-10-CM

## 2018-05-29 DIAGNOSIS — G4733 Obstructive sleep apnea (adult) (pediatric): Secondary | ICD-10-CM

## 2018-05-29 LAB — BASIC METABOLIC PANEL
Anion gap: 7 (ref 5–15)
BUN: 20 mg/dL (ref 8–23)
CO2: 26 mmol/L (ref 22–32)
Calcium: 9.1 mg/dL (ref 8.9–10.3)
Chloride: 101 mmol/L (ref 98–111)
Creatinine, Ser: 1.76 mg/dL — ABNORMAL HIGH (ref 0.61–1.24)
GFR calc Af Amer: 42 mL/min — ABNORMAL LOW (ref >60)
GFR calc non Af Amer: 36 mL/min — ABNORMAL LOW (ref >60)
Glucose, Bld: 244 mg/dL — ABNORMAL HIGH (ref 70–99)
Potassium: 4.1 mmol/L (ref 3.5–5.1)
Sodium: 134 mmol/L — ABNORMAL LOW (ref 135–145)

## 2018-05-29 LAB — GLUCOSE, CAPILLARY
Glucose-Capillary: 134 mg/dL — ABNORMAL HIGH (ref 70–99)
Glucose-Capillary: 239 mg/dL — ABNORMAL HIGH (ref 70–99)
Glucose-Capillary: 204 mg/dL — ABNORMAL HIGH (ref 70–99)
Glucose-Capillary: 189 mg/dL — ABNORMAL HIGH (ref 70–99)

## 2018-05-29 MED ORDER — FLUCONAZOLE 200 MG PO TABS
200.0000 mg | ORAL_TABLET | Freq: Once | ORAL | Status: AC
  Administered 2018-05-29: 200 mg via ORAL
  Filled 2018-05-29: qty 1

## 2018-05-29 MED ORDER — OXYCODONE HCL 5 MG PO TABS: 5.0000 mg | ORAL_TABLET | ORAL | 0 refills | Status: AC | PRN

## 2018-05-29 NOTE — Progress Notes (Signed)
Pt has home cpap on bedside table in reach and I added sterile water to chamber and connected mask. Pt stated he would have RN help him place on mask after nightly medicine was taken. RN currently at bedside and I advised pt and RN to call me if help was needed.

## 2018-05-29 NOTE — Clinical Social Work Note (Signed)
Steven Maldonado will take pt over the weekend, however pt will need three night qualifying stay. IF PT D/C ON SUNDAY, CLAPPS MUST HAVE SUMMARY BEFORE 11:30 AM IN ORDER TO GET MEDS FROM PHARMACY.  Loletha Grayer, MSW [phone]

## 2018-05-29 NOTE — Progress Notes (Signed)
Inpatient Diabetes Program Recommendations  AACE/ADA: New Consensus Statement on Inpatient Glycemic Control (2015)  Target Ranges:  Prepandial:   less than 140 mg/dL      Peak postprandial:   less than 180 mg/dL (1-2 hours)      Critically ill patients:  140 - 180 mg/dL   Lab Results  Component Value Date   GLUCAP 134 (H) 05/29/2018   HGBA1C 11.6 (H) 04/17/2018    Review of Glycemic Control Results for ELZIA, HOTT (MRN [id number]) as of 05/29/2018 11:16  Ref. Range 05/28/2018 15:42 05/28/2018 18:25 05/28/2018 21:43 05/29/2018 05:58  Glucose-Capillary Latest Ref Range: 70 - 99 mg/dL 127 (H) 160 (H) 221 (H) 134 (H)   Diabetes history: Type 2 DM Outpatient Diabetes medications: Glipizide 10 mg QAM, Basaglar 65 units QHS, Humalog 5 unitsTID Current orders for Inpatient glycemic control: Glipizide 10 mg QAM, Lantus 65 units, Novolog 0-15 units TID  Inpatient Diabetes Program Recommendations:    Of note, patient received Decadron 5 mg X 1 on 7/18, thus anticipate Bs to be elevated.   If post prandials continue to exceed 180 mg/dL, consider adding Novolog 5 units TID (assuming that patient is consuming >50% of meal).  Spoke with patient regarding diabetes management. Patient states, "I have been doing much better with diet and taking my insulin."  Reviewed patient's last A1c of 11.6%. Explained what a A1c is and what it measures. Also reviewed goal A1c with patient, importance of good glucose control @ home, and blood sugar goals. Reviewed patho of DM, need for insulin, vascular changes that occurs and long term comorbidites.  Discussed that impatient BS did not reflect this A1C that was drawn at the beginning of June. Assuming that patient has been making good effort towards improvement.   Patient reports taking daily FSBS. Encouraged to increase amount of BS checks to 2-3 times per day. Patient and wife plan to meet with Dr Dwyane Dee as a new patient in the next month. Explained that this would  be helpful in determining insulin adjustments at that time.  Wife if requesting additional outpatient education. Will place consult for [facility] and outpatient referral.   Thanks, Bronson Curb, MSN, RNC-OB Diabetes Coordinator [phone] (8a-5p)

## 2018-05-29 NOTE — Discharge Instructions (Addendum)
Steven Simmer, MD [place]  Please read the following information regarding your care after surgery.  Medications  You only need a prescription for the narcotic pain medicine (ex. oxycodone, Percocet, Norco).  All of the other medicines listed below are available over the counter. X acetominophen (Tylenol) 650 mg every 4-6 hours as you need for minor to moderate pain X oxycodone as prescribed for severe pain  Narcotic pain medicine (ex. oxycodone, Percocet, Vicodin) will cause constipation.  To prevent this problem, take the following medicines while you are taking any pain medicine. X docusate sodium (Colace) 100 mg twice a day X senna (Senokot) 2 tablets twice a day   Weight Bearing X Do not bear any weight on the operated leg or foot.  Cast / Splint / Dressing X Keep your splint, cast or dressing clean and dry.  Dont put anything (coat hanger, pencil, etc) down inside of it.  If it gets damp, use a hair dryer on the cool setting to dry it.  If it gets soaked, call the office to schedule an appointment for a cast change.   After your dressing, cast or splint is removed; you may shower, but do not soak or scrub the wound.  Allow the water to run over it, and then gently pat it dry.  Swelling It is normal for you to have swelling where you had surgery.  To reduce swelling and pain, keep your toes above your nose for at least 3 days after surgery.  It may be necessary to keep your foot or leg elevated for several weeks.  If it hurts, it should be elevated.  Follow Up Call my office at [phone] when you are discharged from the hospital or surgery center to schedule an appointment to be seen two weeks after surgery.  Call my office at [phone] if you develop a fever >101.5 F, nausea, vomiting, bleeding from the surgical site or severe pain.     Information on my medicine - Pradaxa (dabigatran)  Why was Pradaxa prescribed for you? Pradaxa was prescribed for  you to reduce the risk of forming blood clots that cause a stroke if you have a medical condition called atrial fibrillation (a type of irregular heartbeat).    What do you Need to know about PradAXa? Take your Pradaxa TWICE DAILY - one capsule in the morning and one tablet in the evening with or without food.  It would be best to take the doses about the same time each day.  The capsules should not be broken, chewed or opened - they must be swallowed whole.  Do not store Pradaxa in other medication containers - once the bottle is opened the Pradaxa should be used within FOUR months; throw away any capsules that havent been by that time.  Take Pradaxa exactly as prescribed by your doctor.  DO NOT stop taking Pradaxa without talking to the doctor who prescribed the medication.  Stopping without other stroke prevention medication to take the place of Pradaxa may increase your risk of developing a clot that causes a stroke.  Refill your prescription before you run out.  After discharge, you should have regular check-up appointments with your healthcare provider that is prescribing your Pradaxa.  In the future your dose may need to be changed if your kidney function or weight changes by a significant amount.  What do you do if you miss a dose? If you miss a dose, take it as soon as you remember on the same  day.  If your next dose is less than 6 hours away, skip the missed dose.  Do not take two doses of PRADAXA at the same time.  Important Safety Information A possible side effect of Pradaxa is bleeding. You should call your healthcare provider right away if you experience any of the following: ? Bleeding from an injury or your nose that does not stop. ? Unusual colored urine (red or dark brown) or unusual colored stools (red or black). ? Unusual bruising for unknown reasons. ? A serious fall or if you hit your head (even if there is no bleeding).  Some medicines may interact with Pradaxa  and might increase your risk of bleeding or clotting while on Pradaxa. To help avoid this, consult your healthcare provider or pharmacist prior to using any new prescription or non-prescription medications, including herbals, vitamins, non-steroidal anti-inflammatory drugs (NSAIDs) and supplements.  This website has more information on Pradaxa (dabigatran): https://www.pradaxa.com

## 2018-05-29 NOTE — Consult Note (Signed)
   [facility] CM Inpatient Consult   05/29/2018  Steven Maldonado 1941-07-20 [id number]  Referral received from Diabetic Coordinator.   Patient evaluated for community based chronic disease management services with [facility] as a benefit of patient's [employer]. Spoke with patient and wife  at bedside to explain [facility] services. Wife states she is hopeful for some rehab for him as she is not able to physically handle him at this time.  Patient will receive post hospital discharge call and will be evaluated for monthly home visits for assessments and disease process education.  Left contact information and [facility] literature at bedside. Will follow up Inpatient Case Manager aware that [place] following. Unable to speak with inpatient as to see if patient is in a bundle or not.  Consent form obtained.  Patient is in Network. Of note, [facility] Care Management services does not replace or interfere with any services that are arranged by inpatient case management or social work.  For additional questions or referrals please contact:     Natividad Brood, RN BSN [facility] Liaison  [phone] business mobile phone Toll free office [phone]

## 2018-05-29 NOTE — Evaluation (Signed)
Occupational Therapy Evaluation Patient Details Name: Steven Maldonado MRN: [id number] DOB: [birth year] Today's Date: 05/29/2018    History of Present Illness Pt is a 77 y/o male s/p L transtibial amputation. PMH including but not limited to hypothyroidism; OSA on CPAP; s/p TAVR; pacemaker placement; PVD; afib on Pradaxa; HTN; HLD; CAD; DM; stage 3 CKD; and chronic diastolic heart failure. 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   Clinical Impression   Pt with decline in function and safety with ADLs and ADL mobility with decreased strength, balance and endurance. Pt requires extensive assist with LB ADLs and toileting; +2 assist with mobility. Pt would benefit form acute OT services to maximize level of function and safety. Pt planning to d/c to a SNF for ST rehab after acute d/c    Follow Up Recommendations  SNF    Equipment Recommendations  Other (comment)(TBD at SNF)    Recommendations for Other Services       Precautions / Restrictions Precautions Precautions: Fall Restrictions Weight Bearing Restrictions: Yes LLE Weight Bearing: Non weight bearing      Mobility Bed Mobility Overal bed mobility: Needs Assistance Bed Mobility: Supine to Sit     Supine to sit: Mod assist     General bed mobility comments: pt up in recliner upon arrival  Transfers Overall transfer level: Needs assistance Equipment used: Rolling walker (2 wheeled) Transfers: Sit to/from [facility] Sit to Stand: Max assist;+2 physical assistance;From elevated surface Stand pivot transfers: Mod assist;+2 physical assistance       General transfer comment: increased time and effort, cueing for technique and safe hand placement, bed in elevated position, assist to power into standing and with pivotal movement to chair    Balance Overall balance assessment: Needs assistance Sitting-balance support: Bilateral upper extremity supported Sitting balance-Leahy Scale:  Fair Sitting balance - Comments: progressing from min A to close min guard to sit EOB Postural control: Right lateral lean Standing balance support: During functional activity;Bilateral upper extremity supported Standing balance-Leahy Scale: Poor                             ADL either performed or assessed with clinical judgement   ADL Overall ADL's : Needs assistance/impaired Eating/Feeding: Independent;Sitting   Grooming: Wash/dry hands;Wash/dry face;Sitting;Min guard   Upper Body Bathing: Min guard;Sitting   Lower Body Bathing: Total assistance   Upper Body Dressing : Min guard;Sitting   Lower Body Dressing: Total assistance   Toilet Transfer: Maximal assistance;Moderate assistance;+2 for physical assistance;+2 for safety/equipment;BSC;Stand-pivot;RW   Toileting- Clothing Manipulation and Hygiene: Total assistance       Functional mobility during ADLs: Maximal assistance;Moderate assistance;+2 for safety/equipment;+2 for physical assistance       Vision Baseline Vision/History: Wears glasses Wears Glasses: Reading only Patient Visual Report: No change from baseline       Perception     Praxis      Pertinent Vitals/Pain Pain Assessment: 0-10 Pain Score: 5  Faces Pain Scale: Hurts little more Pain Location: L residual limb Pain Descriptors / Indicators: Sore;Discomfort Pain Intervention(s): Monitored during session;Premedicated before session;Repositioned     Hand Dominance Right   Extremity/Trunk Assessment Upper Extremity Assessment Upper Extremity Assessment: Generalized weakness   Lower Extremity Assessment Lower Extremity Assessment: Defer to PT evaluation LLE Deficits / Details: ACE wrap donned to residual limb. Pt decreased strength and ROM limitations secondary to post-op pain and weakness. Pt with knee AROM flexion to 45  degrees and extension to lacking 5 degrees to neutral.       Communication Communication Communication: No  difficulties   Cognition Arousal/Alertness: Awake/alert Behavior During Therapy: Anxious Overall Cognitive Status: Within Functional Limits for tasks assessed                                     General Comments       Exercises Exercises: Amputee Amputee Exercises Quad Sets: AROM;Strengthening;Left;10 reps;Supine Knee Flexion: AROM;Strengthening;Left;10 reps;Supine Knee Extension: AROM;Strengthening;Left;10 reps;Supine   Shoulder Instructions      Home Living Family/patient expects to be discharged to:: Skilled nursing facility Living Arrangements: Spouse/significant other Available Help at Discharge: Family;Available 24 hours/day Type of Home: House Home Access: Ramped entrance     Home Layout: One level     Bathroom Shower/Tub: Occupational psychologist: Handicapped height     Home Equipment: Environmental consultant - 4 wheels;Shower seat;Electric scooter;Grab bars - tub/shower;Grab bars - toilet          Prior Functioning/Environment Level of Independence: Independent with assistive device(s)        Comments: for the past week pt has been using an Transport planner for mobility        OT Problem List: Decreased strength;Decreased activity tolerance;Decreased knowledge of use of DME or AE;Pain;Obesity;Impaired balance (sitting and/or standing)      OT Treatment/Interventions: Self-care/ADL training;Therapeutic exercise;DME and/or AE instruction;Neuromuscular education;Therapeutic activities;Patient/family education    OT Goals(Current goals can be found in the care plan section) Acute Rehab OT Goals Patient Stated Goal: go to rehab, eventually get a prosthesis OT Goal Formulation: With patient/family Time For Goal Achievement: 06/12/18 Potential to Achieve Goals: Good ADL Goals Pt Will Perform Grooming: with supervision;with set-up;sitting Pt Will Perform Upper Body Bathing: with set-up;with supervision;sitting Pt Will Perform Lower Body Bathing:  with max assist;with mod assist;sitting/lateral leans Pt Will Perform Upper Body Dressing: with supervision;with set-up;sitting Pt Will Transfer to Toilet: with max assist;with mod assist;stand pivot transfer;bedside commode Pt Will Perform Toileting - Clothing Manipulation and hygiene: with mod assist;sitting/lateral leans  OT Frequency: Min 2X/week   Barriers to D/C: Decreased caregiver support          Co-evaluation              AM-PAC PT "6 Clicks" Daily Activity     Outcome Measure Help from another person eating meals?: None Help from another person taking care of personal grooming?: A Little Help from another person toileting, which includes using toliet, bedpan, or urinal?: Total Help from another person bathing (including washing, rinsing, drying)?: A Lot Help from another person to put on and taking off regular upper body clothing?: A Little Help from another person to put on and taking off regular lower body clothing?: Total 6 Click Score: 14   End of Session Equipment Utilized During Treatment: Gait belt;Other (comment)(drop arm BSC)  Activity Tolerance: Patient tolerated treatment well Patient left: in chair;with call bell/phone within reach;with nursing/sitter in room;with family/visitor present  OT Visit Diagnosis: Unsteadiness on feet (R26.81);Other abnormalities of gait and mobility (R26.89);Muscle weakness (generalized) (M62.81);Pain Pain - Right/Left: Left Pain - part of body: Leg                Time: 3664-4034 OT Time Calculation (min): 26 min Charges:  OT General Charges $OT Visit: 1 Visit OT Evaluation $OT Eval Moderate Complexity: 1 Mod G-Codes: OT G-codes **NOT FOR INPATIENT  CLASS** Functional Assessment Tool Used: AM-PAC 6 Clicks Daily Activity     Britt Bottom 05/29/2018, 1:51 PM

## 2018-05-29 NOTE — Progress Notes (Addendum)
Subjective: 1 Day Post-Op Procedure(s) (LRB): LEFT AMPUTATION BELOW KNEE (Left)  Patient reports pain as mild to moderate.  Tolerating POs well. Admits to flatus.  Denies fever, chills, N/V, CP, SOB.  Objective:   VITALS:  Temp:  [94.8 F (34.9 C)-98.3 F (36.8 C)] 97.9 F (36.6 C) (07/19 0556) Pulse Rate:  [59-80] 59 (07/19 0556) Resp:  [6-18] 16 (07/18 1728) BP: (133-164)/(59-68) 145/64 (07/19 0556) SpO2:  [91 %-98 %] 98 % (07/19 0556) Weight:  [115.2 kg (254 lb)] 115.2 kg (254 lb) (07/18 1303)  General: WDWN patient in NAD. Psych:  Appropriate mood and affect. Neuro:  A&O x 3, Moving all extremities, sensation intact to light touch HEENT:  EOMs intact Chest:  Even non-labored respirations Skin: Dressing C/D/I, no rashes or lesions Extremities: warm/dry, no visible edema, erythema or echymosis.  No lymphadenopathy. Pulses:Femoral 2+ MSK:  ROM: lacks 5 degrees of TKE, MMT: able to perform quad set    LABS Recent Labs    05/28/18 1419  HGB 11.3*  WBC 8.6  PLT 290   Recent Labs    05/28/18 1419  NA 135  K 4.9  CL 102  CO2 20*  BUN 24*  CREATININE 2.00*  GLUCOSE 134*   No results for input(s): LABPT, INR in the last 72 hours.   Assessment/Plan: 1 Day Post-Op Procedure(s) (LRB): LEFT AMPUTATION BELOW KNEE (Left)  NWB L LE Up with therapy Stump protector ordered.  Stump protector on at all times upon arrival. Resume Pradaxa today Disposition: likely SNF Scripts on chart Plan for 2 week outpatient post-op visit with Dr. Doran Durand Appreciate Medicine Team assistance.  Mechele Claude [name] [facility] Office:  [phone]

## 2018-05-29 NOTE — Evaluation (Signed)
Physical Therapy Evaluation Patient Details Name: Steven Maldonado MRN: [id number] DOB: Mar 16, 1941 Today's Date: 05/29/2018   History of Present Illness  Pt is a 77 y/o male s/p L transtibial amputation. PMH including but not limited to hypothyroidism; OSA on CPAP; s/p TAVR; pacemaker placement; PVD; afib on Pradaxa; HTN; HLD; CAD; DM; stage 3 CKD; and chronic diastolic heart failure. 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    Clinical Impression  Pt presented supine in bed with HOB elevated, awake and willing to participate in therapy session. Prior to admission, pt reported that he has been using an Transport planner for mobility and independent with ADLs. Pt lives with his spouse in a single level home with a ramped entrance. Pt currently requires mod A for bed mobility and mod-max A x2 for transfers with RW. Pt would continue to benefit from skilled physical therapy services at this time while admitted and after d/c to address the below listed limitations in order to improve overall safety and independence with functional mobility.     Follow Up Recommendations SNF;Other (comment)(prefers [facility])    Equipment Recommendations  None recommended by PT    Recommendations for Other Services       Precautions / Restrictions Precautions Precautions: Fall Restrictions Weight Bearing Restrictions: Yes LLE Weight Bearing: Non weight bearing      Mobility  Bed Mobility Overal bed mobility: Needs Assistance Bed Mobility: Supine to Sit     Supine to sit: Mod assist     General bed mobility comments: increased time and effort, assistance with trunk elevation and use of bed pads to position pt's hips at EOB  Transfers Overall transfer level: Needs assistance Equipment used: Rolling walker (2 wheeled) Transfers: Sit to/from [facility] Sit to Stand: Max assist;+2 physical assistance;From elevated surface Stand pivot transfers: Mod assist;+2 physical  assistance       General transfer comment: increased time and effort, cueing for technique and safe hand placement, bed in elevated position, assist to power into standing and with pivotal movement to chair  Ambulation/Gait                Stairs            Wheelchair Mobility    Modified Rankin (Stroke Patients Only)       Balance Overall balance assessment: Needs assistance Sitting-balance support: Bilateral upper extremity supported Sitting balance-Leahy Scale: Poor Sitting balance - Comments: progressing from min A to close min guard to sit EOB Postural control: Right lateral lean Standing balance support: During functional activity;Bilateral upper extremity supported Standing balance-Leahy Scale: Poor                               Pertinent Vitals/Pain Pain Assessment: Faces Faces Pain Scale: Hurts little more Pain Location: L residual limb Pain Descriptors / Indicators: Sore Pain Intervention(s): Monitored during session;Repositioned;RN gave pain meds during session    Home Living Family/patient expects to be discharged to:: Private residence Living Arrangements: Spouse/significant other Available Help at Discharge: Family;Available 24 hours/day Type of Home: House Home Access: Ramped entrance     Home Layout: One level Home Equipment: Walker - 4 wheels;Shower seat;Electric scooter;Grab bars - tub/shower;Grab bars - toilet      Prior Function Level of Independence: Independent with assistive device(s)         Comments: for the past week pt has been using an Transport planner for mobility  Hand Dominance        Extremity/Trunk Assessment   Upper Extremity Assessment Upper Extremity Assessment: Defer to OT evaluation;Overall WFL for tasks assessed    Lower Extremity Assessment Lower Extremity Assessment: Generalized weakness;LLE deficits/detail LLE Deficits / Details: ACE wrap donned to residual limb. Pt decreased  strength and ROM limitations secondary to post-op pain and weakness. Pt with knee AROM flexion to 45 degrees and extension to lacking 5 degrees to neutral.       Communication   Communication: No difficulties  Cognition Arousal/Alertness: Awake/alert Behavior During Therapy: Anxious Overall Cognitive Status: Within Functional Limits for tasks assessed                                        General Comments      Exercises Amputee Exercises Quad Sets: AROM;Strengthening;Left;10 reps;Supine Knee Flexion: AROM;Strengthening;Left;10 reps;Supine Knee Extension: AROM;Strengthening;Left;10 reps;Supine   Assessment/Plan    PT Assessment Patient needs continued PT services  PT Problem List Decreased range of motion;Decreased strength;Decreased activity tolerance;Decreased balance;Decreased mobility;Decreased coordination;Decreased knowledge of use of DME;Decreased safety awareness;Decreased knowledge of precautions;Pain       PT Treatment Interventions DME instruction;Stair training;Gait training;Functional mobility training;Therapeutic exercise;Balance training;Therapeutic activities;Neuromuscular re-education;Patient/family education    PT Goals (Current goals can be found in the Care Plan section)  Acute Rehab PT Goals Patient Stated Goal: go to rehab, eventually get a prosthesis PT Goal Formulation: With patient/family Time For Goal Achievement: 06/12/18 Potential to Achieve Goals: Good    Frequency Min 5X/week   Barriers to discharge        Co-evaluation               AM-PAC PT "6 Clicks" Daily Activity  Outcome Measure Difficulty turning over in bed (including adjusting bedclothes, sheets and blankets)?: Unable Difficulty moving from lying on back to sitting on the side of the bed? : Unable Difficulty sitting down on and standing up from a chair with arms (e.g., wheelchair, bedside commode, etc,.)?: Unable Help needed moving to and from a bed to  chair (including a wheelchair)?: A Lot Help needed walking in hospital room?: Total Help needed climbing 3-5 steps with a railing? : Total 6 Click Score: 7    End of Session Equipment Utilized During Treatment: Gait belt Activity Tolerance: Patient limited by pain;Patient limited by fatigue Patient left: in chair;with call bell/phone within reach;with chair alarm set;with family/visitor present Nurse Communication: Mobility status PT Visit Diagnosis: Other abnormalities of gait and mobility (R26.89);Pain Pain - Right/Left: Left Pain - part of body: Leg    Time: 6203-5597 PT Time Calculation (min) (ACUTE ONLY): 25 min   Charges:   PT Evaluation $PT Eval Moderate Complexity: 1 Mod PT Treatments $Therapeutic Activity: 8-22 mins   PT G Codes:        [place], PT, DPT [place] 05/29/2018, 1:27 PM

## 2018-05-29 NOTE — Progress Notes (Signed)
Orthopedic Tech Progress Note Patient Details:  Steven Maldonado Aug 23, 1941 [id number]  Patient ID: Steven Maldonado, male   DOB: 1940/12/14, 77 y.o.   MRN: [id number]   Steven Maldonado 05/29/2018, 10:00 AMCalled [facility] for left stump protector.

## 2018-05-29 NOTE — Progress Notes (Signed)
PROGRESS NOTE    Steven Maldonado  DZH:[id number] DOB: 1940/12/25 DOA: 05/28/2018 PCP: [name], [name]    Brief Narrative:Steven Maldonado is an 77 y.o. male with h/o hypothyroidism; OSA on CPAP; s/p TAVR; pacemaker placement; PVD; afib on Pradaxa; HTN; HLD; CAD; DM; stage 3 CKD; and chronic diastolic heart failure presenting for BKA resulting from gangrene.  He was admitted to the [facility] service 5/24-28 for cellulitis with gangrene of the 3rd digit and has progressive disease since.  Foot salvage intervention was not felt to be efficacious and so he was underwent left BKA ON 7/18. Medicine consulted for management of his DM.     Assessment & Plan:   Principal Problem:   S/P BKA (below knee amputation) unilateral, left (HCC) Active Problems:   Hyperlipidemia   Diabetes mellitus type 2, uncomplicated (HCC)   Chronic kidney disease   Hypothyroidism   Essential hypertension   PAF (paroxysmal atrial fibrillation) (HCC)   Obstructive sleep apnea treated with continuous positive airway pressure (CPAP)   Gangrene of left foot (HCC)   S/P LEFT BKA :  Pain control. PT /ot evaluations.  Further management as per orthopedics.    Diabetes mellitus  Uncontrolled with hyperglycemia.  CBG (last 3)  Recent Labs    05/28/18 1825 05/28/18 2143 05/29/18 0558  GLUCAP 160* 221* 134*   Resume Lantus 65 units daily and SSI.  Last A1c last month is 11.6.     Atrial fibrillation :  Rate controlled on amiodarone  S/p TAVR.  On pradaxa, which will be continued.     OSA on CPAP at night.  Continue the same.   Hyperlipidemia:  Resume home meds.    Hypothyroidism:  free t4 and t3 are pending.    Hypertension:  Sub optimal , restart home medications.   Mild AKI on stage 3 CKD:  Improving with hydration.  Monitor prn.    Chronic diastolic heart failure: He appears to be compensated.     DVT prophylaxis:Pradaxa.  Code Status: FULL CODE.  Family Communication:none at  bedside Disposition Plan: per primary / orthopedics/ SNF    Procedures: left below knee amputation on 7/18.   Antimicrobials: none.    Subjective: Pain is better, no chest pain or sob.   Objective: Vitals:   05/28/18 1728 05/28/18 1959 05/29/18 0037 05/29/18 0556  BP: (!) 144/60 (!) 154/66 (!) 157/67 (!) 145/64  Pulse: 68 75 62 (!) 59  Resp: 16     Temp: 98.3 F (36.8 C) 98.3 F (36.8 C) (!) 94.8 F (34.9 C) 97.9 F (36.6 C)  TempSrc: Oral Oral Axillary Oral  SpO2: 97% 96% 97% 98%  Weight:      Height:        Intake/Output Summary (Last 24 hours) at 05/29/2018 1124 Last data filed at 05/29/2018 0900 Gross per 24 hour  Intake 945.81 ml  Output 880 ml  Net 65.81 ml   Filed Weights   05/28/18 1303  Weight: 115.2 kg (254 lb)    Examination:  General exam: Appears calm and comfortable  Respiratory system: Clear to auscultation. Respiratory effort normal. Cardiovascular system: S1 & S2 heard, RRR. No JVD, murmurs, rubs, gallops or clicks. No pedal edema. Gastrointestinal system: Abdomen is nondistended, soft and nontender. No organomegaly or masses felt. Normal bowel sounds heard. Central nervous system: Alert and oriented. No focal neurological deficits. Extremities: left BKA Skin: No rashes, lesions or ulcers Psychiatry:Mood & affect appropriate.     Data Reviewed: I have  personally reviewed following labs and imaging studies  CBC: Recent Labs  Lab 05/28/18 1419  WBC 8.6  HGB 11.3*  HCT 36.5*  MCV 97.1  PLT 638   Basic Metabolic Panel: Recent Labs  Lab 05/28/18 1419 05/29/18 0954  NA 135 134*  K 4.9 4.1  CL 102 101  CO2 20* 26  GLUCOSE 134* 244*  BUN 24* 20  CREATININE 2.00* 1.76*  CALCIUM 9.9 9.1   GFR: Estimated Creatinine Clearance: 47.6 mL/min (A) (by C-G formula based on SCr of 1.76 mg/dL (H)). Liver Function Tests: No results for input(s): AST, ALT, ALKPHOS, BILITOT, PROT, ALBUMIN in the last 168 hours. No results for input(s):  LIPASE, AMYLASE in the last 168 hours. No results for input(s): AMMONIA in the last 168 hours. Coagulation Profile: No results for input(s): INR, PROTIME in the last 168 hours. Cardiac Enzymes: No results for input(s): CKTOTAL, CKMB, CKMBINDEX, TROPONINI in the last 168 hours. BNP (last 3 results) Recent Labs    12/11/17 1522  PROBNP 271   HbA1C: No results for input(s): HGBA1C in the last 72 hours. CBG: Recent Labs  Lab 05/28/18 1310 05/28/18 1542 05/28/18 1825 05/28/18 2143 05/29/18 0558  GLUCAP 137* 127* 160* 221* 134*   Lipid Profile: No results for input(s): CHOL, HDL, LDLCALC, TRIG, CHOLHDL, LDLDIRECT in the last 72 hours. Thyroid Function Tests: No results for input(s): TSH, T4TOTAL, FREET4, T3FREE, THYROIDAB in the last 72 hours. Anemia Panel: No results for input(s): VITAMINB12, FOLATE, FERRITIN, TIBC, IRON, RETICCTPCT in the last 72 hours. Sepsis Labs: No results for input(s): PROCALCITON, LATICACIDVEN in the last 168 hours.  No results found for this or any previous visit (from the past 240 hour(s)).       Radiology Studies: No results found.      Scheduled Meds: . amiodarone  200 mg Oral Daily  . aspirin EC  81 mg Oral QPC breakfast  . atorvastatin  80 mg Oral QHS  . dabigatran  150 mg Oral Q12H  . docusate sodium  100 mg Oral QPC breakfast  . [START ON 05/30/2018] furosemide  20 mg Oral Daily  . gabapentin  300 mg Oral TID  . glipiZIDE  10 mg Oral Q breakfast  . insulin aspart  0-15 Units Subcutaneous TID WC  . insulin glargine  65 Units Subcutaneous QHS  . magnesium oxide  400 mg Oral QPC breakfast  . metoprolol tartrate  75 mg Oral BID  . mirabegron ER  25 mg Oral Daily  . nitroGLYCERIN  0.2 mg Transdermal Daily  . pentoxifylline  400 mg Oral TID WC  . senna  1 tablet Oral BID   Continuous Infusions:   LOS: 1 day    Time spent: 35 minutes.     Hosie Poisson, MD [facility] Pager [phone]   If 7PM-7AM, please contact  night-coverage [email or web address] Password [facility] 05/29/2018, 11:24 AM

## 2018-05-29 NOTE — NC FL2 (Signed)
[place] LEVEL OF CARE SCREENING TOOL     IDENTIFICATION  Patient Name: Steven Maldonado Birthdate: 1940/12/17 Sex: male Admission Date (Current Location): 05/28/2018  [facility] and [place] Number:  Herbalist and Address:  [facility], [place] [address] [place], [place] [address]      Provider Number: [id number]  Attending Physician Name and Address:  Wylene Simmer, MD  Relative Name and Phone Number:       Current Level of Care: Hospital Recommended Level of Care: [place] Prior Approval Number:    Date Approved/Denied:   PASRR Number: [id number]  Discharge Plan: SNF    Current Diagnoses: Patient Active Problem List   Diagnosis Date Noted  . Gangrene of left foot ([place]) 05/28/2018  . S/P BKA (below knee amputation) unilateral, left ([place]) 05/28/2018  . Obstructive sleep apnea treated with continuous positive airway pressure (CPAP) 05/04/2018  . Diabetes mellitus type 2, uncontrolled ([place]) 04/22/2018  . Atherosclerosis of native arteries of the extremities with gangrene ([place]) 04/09/2018  . Cellulitis in diabetic foot ([place]) 04/04/2018  . PAF (paroxysmal atrial fibrillation) ([place]) 12/05/2017  . Pacemaker   . Symptomatic bradycardia   . Asystole ([place])   . 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 Severe aortic stenosis   . Essential hypertension 11/14/2015  . PAD (peripheral artery disease) ([place]) 05/24/2014  . Chronic kidney disease 08/30/2013  . Hypothyroidism 08/30/2013  . Obesity (BMI 30-39.9) 07/15/2013  . Diabetes mellitus type 2, uncomplicated ([place])   . Hyperlipidemia 01/10/2011  . CAD (coronary artery disease) 01/10/2011    Orientation RESPIRATION BLADDER Height & Weight     Self, Time, Situation, Place  Normal Continent Weight: 254 lb (115.2 kg) Height:  5\' 11"  (180.3 cm)  BEHAVIORAL SYMPTOMS/MOOD NEUROLOGICAL BOWEL NUTRITION STATUS      Continent Diet(Carb modified, thin  liquids)  AMBULATORY STATUS COMMUNICATION OF NEEDS Skin   Extensive Assist Verbally Surgical wounds(Left leg, compression wrapped)                       Personal Care Assistance Level of Assistance  Bathing, Feeding, Dressing Bathing Assistance: Maximum assistance Feeding assistance: Independent Dressing Assistance: Maximum assistance     Functional Limitations Info  Sight, Hearing, Speech Sight Info: Adequate Hearing Info: Adequate Speech Info: Adequate    SPECIAL CARE FACTORS FREQUENCY  PT (By licensed PT), OT (By licensed OT)     PT Frequency: 5x OT Frequency: 5x            Contractures Contractures Info: Not present    Additional Factors Info  Code Status, Allergies Code Status Info: Full COde Allergies Info: NO known allergies           Current Medications (05/29/2018):  This is the current hospital active medication list Current Facility-Administered Medications  Medication Dose Route Frequency Provider Last Rate Last Dose  . acetaminophen (TYLENOL) tablet 650 mg  650 mg Oral Q6H PRN Wylene Simmer, MD   650 mg at 05/29/18 4944   Or  . acetaminophen (TYLENOL) suppository 650 mg  650 mg Rectal Q6H PRN Wylene Simmer, MD      . amiodarone (PACERONE) tablet 200 mg  200 mg Oral Daily Wylene Simmer, MD   200 mg at 05/29/18 1100  . aspirin EC tablet 81 mg  81 mg Oral QPC breakfast Wylene Simmer, MD   81 mg at 05/29/18 1119  . atorvastatin (LIPITOR) tablet 80 mg  80 mg Oral Shearon Stalls, MD   80 mg at 05/28/18 2237  . dabigatran (PRADAXA) capsule 150 mg  150 mg Oral Q12H Wylene Simmer, MD   150 mg at 05/29/18 1100  . docusate sodium (COLACE) capsule 100 mg  100 mg Oral QPC breakfast Wylene Simmer, MD   100 mg at 05/29/18 1116  . fluconazole (DIFLUCAN) tablet 200 mg  200 mg Oral Once Hosie Poisson, MD      . Derrill Memo ON 05/30/2018] furosemide (LASIX) tablet 20 mg  20 mg Oral Daily Karmen Bongo, MD      . gabapentin (NEURONTIN) capsule 300 mg  300 mg Oral TID Wylene Simmer, MD   300 mg at 05/29/18 1117  . insulin aspart (novoLOG) injection 0-15 Units  0-15 Units Subcutaneous TID WC Wylene Simmer, MD   5 Units at 05/29/18 1239  . insulin glargine (LANTUS) injection 65 Units  65 Units Subcutaneous QHS Wylene Simmer, MD   65 Units at 05/28/18 2237  . magnesium hydroxide (MILK OF MAGNESIA) suspension 30 mL  30 mL Oral Daily PRN Wylene Simmer, MD      . magnesium oxide (MAG-OX) tablet 400 mg  400 mg Oral QPC breakfast Wylene Simmer, MD   400 mg at 05/29/18 1116  . metoprolol tartrate (LOPRESSOR) tablet 75 mg  75 mg Oral BID Wylene Simmer, MD   75 mg at 05/29/18 1100  . mirabegron ER (MYRBETRIQ) tablet 25 mg  25 mg Oral Daily Wylene Simmer, MD   25 mg at 05/29/18 1116  . morphine 2 MG/ML injection 2 mg  2 mg Intravenous Q2H PRN Wylene Simmer, MD      . nitroGLYCERIN (NITRODUR - Dosed in mg/24 hr) patch 0.2 mg  0.2 mg Transdermal Daily Wylene Simmer, MD      . oxyCODONE (Oxy IR/ROXICODONE) immediate release tablet 5-10 mg  5-10 mg Oral Q3H PRN Wylene Simmer, MD   10 mg at 05/28/18 1604  . pentoxifylline (TRENTAL) CR tablet 400 mg  400 mg Oral TID WC Wylene Simmer, MD   400 mg at 05/29/18 1148  . senna (SENOKOT) tablet 8.6 mg  1 tablet Oral BID Wylene Simmer, MD   8.6 mg at 05/29/18 1117  . sodium phosphate (FLEET) 7-19 GM/118ML enema 1 enema  1 enema Rectal Once PRN Wylene Simmer, MD      . sorbitol 70 % solution 30 mL  30 mL Oral Daily PRN Wylene Simmer, MD         Discharge Medications: Please see discharge summary for a list of discharge medications.  Relevant Imaging Results:  Relevant Lab Results:   Additional Information SSN: [id number]  Eileen Stanford, LCSW

## 2018-05-29 NOTE — Progress Notes (Signed)
RT helped patient place home CPAP on.  Sterile water added.  Patient resting comfortable.

## 2018-05-29 NOTE — Clinical Social Work Note (Signed)
Clinical Social Work Assessment  Patient Details  Name: Steven Maldonado MRN: [id number] Date of Birth: [birth year]  Date of referral:  05/29/18               Reason for consult:  Facility Placement                Permission sought to share information with:  Family Supports, Customer service manager Permission granted to share information::  Yes, Verbal Permission Granted  Name::     Investment banker, corporate::  [employer]  Relationship::  Spouse  Contact Information:    [phone]   Housing/Transportation Living arrangements for the past 2 months:  [place] of Information:  Patient Patient Interpreter Needed:  None Criminal Activity/Legal Involvement Pertinent to Current Situation/Hospitalization:  No - Comment as needed Significant Relationships:  Spouse Lives with:  Spouse Do you feel safe going back to the place where you live?  No Need for family participation in patient care:  No (Coment)  Care giving concerns:  Pt is alert and oriented. Pt was functioning independently at home with spouse prior to admission.   Social Worker assessment / plan:  CSW spoke with pt and pt's spouse at bedside. Pt is aware of SNF recommdenation and agreeable. Pt would prefer to go to 1) [facility] or 2)[facility]. CSW will follow up with facilities and pt/pt spouse.  Employment status:  Retired Forensic scientist:  Medicare PT Recommendations:  Man / Referral to community resources:  [place]  Patient/Family's Response to care:  Pt verbalized understanding of CSW role and expressed appreciation for support. Pt denies any concern regarding pt care at this time.   Patient/Family's Understanding of and Emotional Response to Diagnosis, Current Treatment, and Prognosis:  Pt understanding and realistic regarding physical limitations. Pt understands the need for SNF placement at d/c. Pt agreeable to SNF placement at d/c, at  this time. Pt's responses emotionally appropriate during conversation with CSW. Pt denies any concern regarding treatment plan at this time. CSW will continue to provide support and facilitate d/c needs.    Emotional Assessment Appearance:  Appears stated age Attitude/Demeanor/Rapport:  (Patient was appropriate) Affect (typically observed):  Accepting, Appropriate, Calm Orientation:  Oriented to Situation, Oriented to  Time, Oriented to Place, Oriented to Self Alcohol / Substance use:  Not Applicable Psych involvement (Current and /or in the community):  No (Comment)  Discharge Needs  Concerns to be addressed:  Basic Needs, Care Coordination Readmission within the last 30 days:  No Current discharge risk:  Dependent with Mobility Barriers to Discharge:  Continued Medical Work up   [employer], LCSW 05/29/2018, 2:43 PM

## 2018-05-30 LAB — GLUCOSE, CAPILLARY
Glucose-Capillary: 140 mg/dL — ABNORMAL HIGH (ref 70–99)
Glucose-Capillary: 160 mg/dL — ABNORMAL HIGH (ref 70–99)
Glucose-Capillary: 150 mg/dL — ABNORMAL HIGH (ref 70–99)
Glucose-Capillary: 242 mg/dL — ABNORMAL HIGH (ref 70–99)

## 2018-05-30 LAB — BASIC METABOLIC PANEL
Anion gap: 8 (ref 5–15)
BUN: 21 mg/dL (ref 8–23)
CO2: 27 mmol/L (ref 22–32)
Calcium: 9.1 mg/dL (ref 8.9–10.3)
Chloride: 100 mmol/L (ref 98–111)
Creatinine, Ser: 1.75 mg/dL — ABNORMAL HIGH (ref 0.61–1.24)
GFR calc Af Amer: 42 mL/min — ABNORMAL LOW (ref >60)
GFR calc non Af Amer: 37 mL/min — ABNORMAL LOW (ref >60)
Glucose, Bld: 177 mg/dL — ABNORMAL HIGH (ref 70–99)
Potassium: 4.2 mmol/L (ref 3.5–5.1)
Sodium: 135 mmol/L (ref 135–145)

## 2018-05-30 LAB — T4, FREE: Free T4: 0.42 ng/dL — ABNORMAL LOW (ref 0.82–1.77)

## 2018-05-30 NOTE — Progress Notes (Signed)
PROGRESS NOTE    Steven Maldonado  YHC:[id number] DOB: [birth year] DOA: 05/28/2018 PCP: [name], [name]    Brief Narrative:Steven Maldonado is an 77 y.o. male with h/o hypothyroidism; OSA on CPAP; s/p TAVR; pacemaker placement; PVD; afib on Pradaxa; HTN; HLD; CAD; DM; stage 3 CKD; and chronic diastolic heart failure presenting for BKA resulting from gangrene.  He was admitted to the [facility] service 5/24-28 for cellulitis with gangrene of the 3rd digit and has progressive disease since.  Foot salvage intervention was not felt to be efficacious and so he was underwent left BKA ON 7/18. Medicine consulted for management of his DM.     Assessment & Plan:   Principal Problem:   S/P BKA (below knee amputation) unilateral, left (HCC) Active Problems:   Hyperlipidemia   Diabetes mellitus type 2, uncomplicated (HCC)   Chronic kidney disease   Hypothyroidism   Essential hypertension   PAF (paroxysmal atrial fibrillation) (HCC)   Obstructive sleep apnea treated with continuous positive airway pressure (CPAP)   Gangrene of left foot (HCC)   S/P LEFT BKA :  Pain control. PT /ot evaluations.  Further management as per orthopedics.  Possible d/c in the next 24 to 48 hours.   Diabetes mellitus  Uncontrolled with hyperglycemia.  CBG (last 3)  Recent Labs    05/29/18 2126 05/30/18 0659 05/30/18 1201  GLUCAP 189* 140* 160*   Resume Lantus 65 units daily and SSI.  Last A1c last month is 11.6.  Better controlled today.  Recommend outpatient follow up with endocrinologist.     Atrial fibrillation :  Rate controlled on amiodarone  S/p TAVR.  On pradaxa, which will be continued.     OSA on CPAP at night.  Continue the same.   Hyperlipidemia:  Resume home meds.    Hypothyroidism:  Free t4 ordered today.    Hypertension:  Well controlled today.  restart home medications.   Mild AKI on stage 3 CKD:  Improving with hydration.  Monitor prn.    Chronic diastolic heart  failure: He appears to be compensated.     DVT prophylaxis:Pradaxa.  Code Status: FULL CODE.  Family Communication:none at bedside Disposition Plan: per primary / orthopedics/ SNF    Procedures: left below knee amputation on 7/18.   Antimicrobials: none.    Subjective: No chest pain or sob. Pain well controlled.   Objective: Vitals:   05/29/18 1156 05/29/18 2125 05/30/18 1357 05/30/18 1358  BP: 120/74 (!) 133/56 (!) 126/113 129/60  Pulse: (!) 59 (!) 59 66 60  Resp: 16  16   Temp: 98.1 F (36.7 C) 98.7 F (37.1 C) 98.4 F (36.9 C)   TempSrc: Oral Oral Oral   SpO2: 99% 96% 97% 95%  Weight:      Height:        Intake/Output Summary (Last 24 hours) at 05/30/2018 1409 Last data filed at 05/30/2018 0900 Gross per 24 hour  Intake 120 ml  Output 450 ml  Net -330 ml   Filed Weights   05/28/18 1303  Weight: 115.2 kg (254 lb)    Examination:  General exam: Appears calm and comfortable  Respiratory system: Clear to auscultation. Respiratory effort normal. Cardiovascular system: S1 & S2 heard, RRR. No JVD, murmurs, rubs, gallops or clicks. No pedal edema. Gastrointestinal system: Abdomen is nondistended, soft and nontender. No organomegaly or masses felt. Normal bowel sounds heard. Central nervous system: Alert and oriented. No focal neurological deficits. Extremities: left BKA bandaged. No drainage.  Skin: No rashes, lesions or ulcers Psychiatry:Mood & affect appropriate.     Data Reviewed: I have personally reviewed following labs and imaging studies  CBC: Recent Labs  Lab 05/28/18 1419  WBC 8.6  HGB 11.3*  HCT 36.5*  MCV 97.1  PLT 812   Basic Metabolic Panel: Recent Labs  Lab 05/28/18 1419 05/29/18 0954  NA 135 134*  K 4.9 4.1  CL 102 101  CO2 20* 26  GLUCOSE 134* 244*  BUN 24* 20  CREATININE 2.00* 1.76*  CALCIUM 9.9 9.1   GFR: Estimated Creatinine Clearance: 47.6 mL/min (A) (by C-G formula based on SCr of 1.76 mg/dL (H)). Liver Function  Tests: No results for input(s): AST, ALT, ALKPHOS, BILITOT, PROT, ALBUMIN in the last 168 hours. No results for input(s): LIPASE, AMYLASE in the last 168 hours. No results for input(s): AMMONIA in the last 168 hours. Coagulation Profile: No results for input(s): INR, PROTIME in the last 168 hours. Cardiac Enzymes: No results for input(s): CKTOTAL, CKMB, CKMBINDEX, TROPONINI in the last 168 hours. BNP (last 3 results) Recent Labs    12/11/17 1522  PROBNP 271   HbA1C: No results for input(s): HGBA1C in the last 72 hours. CBG: Recent Labs  Lab 05/29/18 1154 05/29/18 1617 05/29/18 2126 05/30/18 0659 05/30/18 1201  GLUCAP 239* 204* 189* 140* 160*   Lipid Profile: No results for input(s): CHOL, HDL, LDLCALC, TRIG, CHOLHDL, LDLDIRECT in the last 72 hours. Thyroid Function Tests: No results for input(s): TSH, T4TOTAL, FREET4, T3FREE, THYROIDAB in the last 72 hours. Anemia Panel: No results for input(s): VITAMINB12, FOLATE, FERRITIN, TIBC, IRON, RETICCTPCT in the last 72 hours. Sepsis Labs: No results for input(s): PROCALCITON, LATICACIDVEN in the last 168 hours.  No results found for this or any previous visit (from the past 240 hour(s)).       Radiology Studies: No results found.      Scheduled Meds: . amiodarone  200 mg Oral Daily  . aspirin EC  81 mg Oral QPC breakfast  . atorvastatin  80 mg Oral QHS  . dabigatran  150 mg Oral Q12H  . docusate sodium  100 mg Oral QPC breakfast  . furosemide  20 mg Oral Daily  . gabapentin  300 mg Oral TID  . insulin aspart  0-15 Units Subcutaneous TID WC  . insulin glargine  65 Units Subcutaneous QHS  . magnesium oxide  400 mg Oral QPC breakfast  . metoprolol tartrate  75 mg Oral BID  . mirabegron ER  25 mg Oral Daily  . nitroGLYCERIN  0.2 mg Transdermal Daily  . pentoxifylline  400 mg Oral TID WC  . senna  1 tablet Oral BID   Continuous Infusions:   LOS: 2 days    Time spent: 35 minutes.     Hosie Poisson,  MD [facility] Pager [phone]   If 7PM-7AM, please contact night-coverage [email or web address] Password [email or web address] 05/30/2018, 2:09 PM

## 2018-05-30 NOTE — Progress Notes (Signed)
Occupational Therapy Treatment Patient Details Name: Steven Maldonado MRN: [id number] DOB: Oct 25, 1941 Today's Date: 05/30/2018    History of present illness Pt is a 77 y/o male s/p L transtibial amputation. PMH including but not limited to hypothyroidism; OSA on CPAP; s/p TAVR; pacemaker placement; PVD; afib on Pradaxa; HTN; HLD; CAD; DM; stage 3 CKD; and chronic diastolic heart failure. 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   OT comments  Pt demonstrating progress toward OT goals. He was able to complete lateral scoot transfer from bed to chair in preparation for improved toilet transfers with drop-arm BSC with mod assist +2 this session. RN present and assisting in order to facilitate safe OOB mobility between therapy sessions to maximize recovery. Pt instructed concerning chair push-ups as well as B UE strengthening exercises to improve functional use of B UE for ADL and ADL transfers s/p L transtibial amputation with handout provided. D/C recommendation remains appropriate. Will continue to follow while admitted.    Follow Up Recommendations  SNF    Equipment Recommendations  3 in 1 bedside commode(drop-arm)    Recommendations for Other Services      Precautions / Restrictions Precautions Precautions: Fall Restrictions Weight Bearing Restrictions: Yes LLE Weight Bearing: Non weight bearing       Mobility Bed Mobility Overal bed mobility: Needs Assistance Bed Mobility: Supine to Sit     Supine to sit: Mod assist     General bed mobility comments: Assist to support trunk from [facility].   Transfers Overall transfer level: Needs assistance   Transfers: Lateral/Scoot Transfers          Lateral/Scoot Transfers: Mod assist;+2 physical assistance General transfer comment: Facilitated lateral scoot transfer with use of chuck pad and drop-arm BSC.     Balance Overall balance assessment: Needs assistance Sitting-balance support: Bilateral upper extremity  supported Sitting balance-Leahy Scale: Fair   Postural control: Left lateral lean                                 ADL either performed or assessed with clinical judgement   ADL Overall ADL's : Needs assistance/impaired                         Toilet Transfer: Moderate assistance;+2 for safety/equipment;BSC(lateral scoot) Toilet Transfer Details (indicate cue type and reason): simulated from bed to drop-arm recliner with lateral scoot transfer         Functional mobility during ADLs: Moderate assistance;+2 for physical assistance(lateral scoot only) General ADL Comments: RN requesting training concerning safest methods to assist pt for OOB mobility between therapy sessions. Educated concerning lateral scoot transfer with drop-arm recliner. Educated on need to transfer to strong side only. Additionally, facilitated improved functional B UE strength with chair push-ups as well as resistance band exercises.      Vision       Perception     Praxis      Cognition Arousal/Alertness: Awake/alert Behavior During Therapy: Anxious Overall Cognitive Status: Impaired/Different from baseline Area of Impairment: Attention                   Current Attention Level: Selective           General Comments: Pt at times tangential and easily distracted.         Exercises Exercises: General Upper Extremity;Other exercises General Exercises - Upper Extremity Shoulder Flexion: Strengthening;10 reps;Both;Seated;Theraband  Theraband Level (Shoulder Flexion): Level 3 (Green) Shoulder Extension: Strengthening;Both;10 reps;Seated;Theraband Theraband Level (Shoulder Extension): Level 3 (Green) Shoulder Horizontal ABduction: Strengthening;Both;10 reps;Seated;Theraband Theraband Level (Shoulder Horizontal Abduction): Level 3 (Green) Chair Push Up: Strengthening;Both;5 reps;Seated Other Exercises Other Exercises: Pt able to complete 10 diagonal pull aparts  bilaterally with green (level 3) theraband.   Shoulder Instructions       General Comments      Pertinent Vitals/ Pain       Pain Assessment: Faces Faces Pain Scale: Hurts even more Pain Location: cramping in L residual limb Pain Descriptors / Indicators: Sore;Discomfort;Cramping Pain Intervention(s): Limited activity within patient's tolerance;Monitored during session;Repositioned  Home Living                                          Prior Functioning/Environment              Frequency  Min 2X/week        Progress Toward Goals  OT Goals(current goals can now be found in the care plan section)  Progress towards OT goals: Progressing toward goals  Acute Rehab OT Goals Patient Stated Goal: get stronger and get a prosthesis OT Goal Formulation: With patient/family Time For Goal Achievement: 06/12/18 Potential to Achieve Goals: Good ADL Goals Pt/caregiver will Perform Home Exercise Program: Increased strength;Both right and left upper extremity;With written HEP provided;With Supervision  Plan Discharge plan remains appropriate    Co-evaluation                 AM-PAC PT "6 Clicks" Daily Activity     Outcome Measure   Help from another person eating meals?: None Help from another person taking care of personal grooming?: A Little Help from another person toileting, which includes using toliet, bedpan, or urinal?: Total Help from another person bathing (including washing, rinsing, drying)?: A Lot Help from another person to put on and taking off regular upper body clothing?: A Little Help from another person to put on and taking off regular lower body clothing?: Total 6 Click Score: 14    End of Session Equipment Utilized During Treatment: Gait belt  OT Visit Diagnosis: Unsteadiness on feet (R26.81);Other abnormalities of gait and mobility (R26.89);Muscle weakness (generalized) (M62.81);Pain Pain - Right/Left: Left Pain - part of body:  Leg(residual limb)   Activity Tolerance Patient tolerated treatment well   Patient Left in chair;with call bell/phone within reach;with family/visitor present;with chair alarm set   Nurse Communication Mobility status;Other (comment)(RN present throughout session)        Time: 1610-9604 OT Time Calculation (min): 48 min  Charges: OT General Charges $OT Visit: 1 Visit OT Treatments $Self Care/Home Management : 23-37 mins $Therapeutic Exercise: 8-22 mins  Norman Herrlich, MS OTR/L  Pager: [place] A Treesa Mccully 05/30/2018, 6:37 PM

## 2018-05-30 NOTE — Progress Notes (Signed)
Subjective: 2 Days Post-Op Procedure(s) (LRB): LEFT AMPUTATION BELOW KNEE (Left) Patient reports pain as 3 on 0-10 scale.    Objective: Vital signs in last 24 hours: Temp:  [98.1 F (36.7 C)-98.7 F (37.1 C)] 98.7 F (37.1 C) (07/19 2125) Pulse Rate:  [59] 59 (07/19 2125) Resp:  [16] 16 (07/19 1156) BP: (120-133)/(56-74) 133/56 (07/19 2125) SpO2:  [96 %-99 %] 96 % (07/19 2125)  Intake/Output from previous day: 07/19 0701 - 07/20 0700 In: 480 [P.O.:480] Out: 300 [Urine:300] Intake/Output this shift: No intake/output data recorded.  Recent Labs    05/28/18 1419  HGB 11.3*   Recent Labs    05/28/18 1419  WBC 8.6  RBC 3.76*  HCT 36.5*  PLT 290   Recent Labs    05/28/18 1419 05/29/18 0954  NA 135 134*  K 4.9 4.1  CL 102 101  CO2 20* 26  BUN 24* 20  CREATININE 2.00* 1.76*  GLUCOSE 134* 244*  CALCIUM 9.9 9.1   No results for input(s): LABPT, INR in the last 72 hours.  Incision: dressing C/D/I  BKA dressing intact no blood.  No DVT    Assessment/Plan: Doing well.   2 Days Post-Op Procedure(s) (LRB): LEFT AMPUTATION BELOW KNEE (Left) Advance diet Up with therapy D/C IV fluids Discharge home with home health Monday    [place] [employer] C 05/30/2018, 8:16 AM

## 2018-05-30 NOTE — Progress Notes (Signed)
Patient stated he would place himself on CPAP when he is ready for bed. RT informed patient to have RN call RT if assistance is needed. RT will monitor as needed.

## 2018-05-31 LAB — CBC
HCT: 30.3 % — ABNORMAL LOW (ref 39.0–52.0)
Hemoglobin: 9.6 g/dL — ABNORMAL LOW (ref 13.0–17.0)
MCH: 29.9 pg (ref 26.0–34.0)
MCHC: 31.7 g/dL (ref 30.0–36.0)
MCV: 94.4 fL (ref 78.0–100.0)
Platelets: 271 10*3/uL (ref 150–400)
RBC: 3.21 MIL/uL — ABNORMAL LOW (ref 4.22–5.81)
RDW: 13.5 % (ref 11.5–15.5)
WBC: 6.7 10*3/uL (ref 4.0–10.5)

## 2018-05-31 LAB — GLUCOSE, CAPILLARY
Glucose-Capillary: 179 mg/dL — ABNORMAL HIGH (ref 70–99)
Glucose-Capillary: 200 mg/dL — ABNORMAL HIGH (ref 70–99)
Glucose-Capillary: 216 mg/dL — ABNORMAL HIGH (ref 70–99)
Glucose-Capillary: 186 mg/dL — ABNORMAL HIGH (ref 70–99)

## 2018-05-31 MED ORDER — OXYCODONE HCL 5 MG PO TABS
5.0000 mg | ORAL_TABLET | ORAL | Status: DC | PRN
  Administered 2018-05-31 – 2018-06-01 (×2): 10 mg via ORAL
  Filled 2018-05-31 (×2): qty 2

## 2018-05-31 MED ORDER — OXYCODONE HCL 5 MG PO TABS
5.0000 mg | ORAL_TABLET | ORAL | Status: DC | PRN
  Administered 2018-05-31: 5 mg via ORAL
  Filled 2018-05-31: qty 1

## 2018-05-31 MED ORDER — INSULIN ASPART 100 UNIT/ML ~~LOC~~ SOLN
3.0000 [IU] | Freq: Three times a day (TID) | SUBCUTANEOUS | Status: DC
Start: 2018-05-31 — End: 2018-06-01
  Administered 2018-05-31 – 2018-06-01 (×4): 3 [IU] via SUBCUTANEOUS

## 2018-05-31 NOTE — Clinical Social Work Note (Signed)
IF PT D/C TODAY, CLAPPS MUST HAVE SUMMARY BEFORE 11:30 AM IN ORDER TO GET MEDS FROM PHARMACY.  Loletha Grayer, MSW [phone]

## 2018-05-31 NOTE — Progress Notes (Signed)
Subjective: 3 Days Post-Op Procedure(s) (LRB): LEFT AMPUTATION BELOW KNEE (Left) Patient reports pain as mild.  Well controlled with oral pain meds.  Tolerating regular diet.  Objective: Vital signs in last 24 hours: Temp:  [97.6 F (36.4 C)-98.4 F (36.9 C)] 97.6 F (36.4 C) (07/21 0524) Pulse Rate:  [60-66] 61 (07/21 0524) Resp:  [16-17] 16 (07/21 0524) BP: (126-137)/(60-113) 135/60 (07/21 0524) SpO2:  [95 %-97 %] 97 % (07/21 0524)  Intake/Output from previous day: 07/20 0701 - 07/21 0700 In: 600 [P.O.:600] Out: 1175 [Urine:1175] Intake/Output this shift: No intake/output data recorded.  Recent Labs    05/28/18 1419 05/31/18 0611  HGB 11.3* 9.6*   Recent Labs    05/28/18 1419 05/31/18 0611  WBC 8.6 6.7  RBC 3.76* 3.21*  HCT 36.5* 30.3*  PLT 290 271   Recent Labs    05/29/18 0954 05/30/18 1429  NA 134* 135  K 4.1 4.2  CL 101 100  CO2 26 27  BUN 20 21  CREATININE 1.76* 1.75*  GLUCOSE 244* 177*  CALCIUM 9.1 9.1   No results for input(s): LABPT, INR in the last 72 hours.  PE:  wn wd man in nad.  L LE dressed and dry.  Stump shrinker and protector in place.    Assessment/Plan: 3 Days Post-Op Procedure(s) (LRB): LEFT AMPUTATION BELOW KNEE (Left) Continue PT.  SNF placement pending.      Steven Maldonado 05/31/2018, 9:56 AM

## 2018-05-31 NOTE — Progress Notes (Signed)
PROGRESS NOTE    Steven Maldonado  QPY:[id number] DOB: Mar 19, 1941 DOA: 05/28/2018 PCP: [name], [name]    Brief Narrative:Steven Maldonado is an 77 y.o. male with h/o hypothyroidism; OSA on CPAP; s/p TAVR; pacemaker placement; PVD; afib on Pradaxa; HTN; HLD; CAD; DM; stage 3 CKD; and chronic diastolic heart failure presenting for BKA resulting from gangrene.  He was admitted to the [facility] service 5/24-28 for cellulitis with gangrene of the 3rd digit and has progressive disease since.  Foot salvage intervention was not felt to be efficacious and so he was underwent left BKA ON 7/18. Medicine consulted for management of his DM.     Assessment & Plan:   Principal Problem:   S/P BKA (below knee amputation) unilateral, left (HCC) Active Problems:   Hyperlipidemia   Diabetes mellitus type 2, uncomplicated (HCC)   Chronic kidney disease   Hypothyroidism   Essential hypertension   PAF (paroxysmal atrial fibrillation) (HCC)   Obstructive sleep apnea treated with continuous positive airway pressure (CPAP)   Gangrene of left foot (HCC)   S/P LEFT BKA :  Pain control. PT /ot evaluations.  Further management as per orthopedics.  Possible d/c in the next 24 to 48 hours.   Diabetes mellitus  Uncontrolled with hyperglycemia.  CBG (last 3)  Recent Labs    05/30/18 2128 05/31/18 0718 05/31/18 1200  GLUCAP 242* 179* 200*   Resume Lantus 65 units daily and SSI. cbgs still elevated, will add novolog 3 units TIDAC.  Last A1c last month is 11.6.  Better controlled today.  Recommend outpatient follow up with endocrinologist.     Atrial fibrillation :  Rate controlled on amiodarone  S/p TAVR.  On pradaxa, which will be continued.     OSA on CPAP at night.  Continue the same.   Hyperlipidemia:  Resume lipitor.     Hypothyroidism:  Abnormal TSH. Free t4 is low. Abnormal thyroid panel probably from amiodarone.   Hypertension:  Well controlled today.  restart home medications.    Mild AKI on stage 3 CKD:  Improving with hydration.  Monitor prn.    Chronic diastolic heart failure: He appears to be compensated.     DVT prophylaxis:Pradaxa.  Code Status: FULL CODE.  Family Communication:none at bedside Disposition Plan: per primary / orthopedics/ SNF    Procedures: left below knee amputation on 7/18.   Antimicrobials: none.    Subjective: No complaints.    Objective: Vitals:   05/30/18 1941 05/30/18 2300 05/31/18 0524 05/31/18 1539  BP: 137/72  135/60 126/60  Pulse: 62 60 61 (!) 58  Resp: 17 16 16 16   Temp: 98.3 F (36.8 C)  97.6 F (36.4 C) 98.7 F (37.1 C)  TempSrc: Oral  Axillary Oral  SpO2: 96% 95% 97% 96%  Weight:      Height:        Intake/Output Summary (Last 24 hours) at 05/31/2018 1604 Last data filed at 05/31/2018 1500 Gross per 24 hour  Intake 720 ml  Output 1335 ml  Net -615 ml   Filed Weights   05/28/18 1303  Weight: 115.2 kg (254 lb)    Examination:  General exam: Appears calm and comfortable , not in distress,  Respiratory system: Clear to auscultation. Respiratory effort normal. No wheezing or rhonchi.  Cardiovascular system: S1 & S2 heard, RRR. No JVD, murmurs,  Gastrointestinal system: Abdomen is soft NT ND BS+ Central nervous system: Alert and oriented. No focal neurological deficits. Extremities: left BKA bandaged. No  drainage.  Skin: No rashes, lesions or ulcers Psychiatry:Mood & affect appropriate.     Data Reviewed: I have personally reviewed following labs and imaging studies  CBC: Recent Labs  Lab 05/28/18 1419 05/31/18 0611  WBC 8.6 6.7  HGB 11.3* 9.6*  HCT 36.5* 30.3*  MCV 97.1 94.4  PLT 290 144   Basic Metabolic Panel: Recent Labs  Lab 05/28/18 1419 05/29/18 0954 05/30/18 1429  NA 135 134* 135  K 4.9 4.1 4.2  CL 102 101 100  CO2 20* 26 27  GLUCOSE 134* 244* 177*  BUN 24* 20 21  CREATININE 2.00* 1.76* 1.75*  CALCIUM 9.9 9.1 9.1   GFR: Estimated Creatinine Clearance: 47.8  mL/min (A) (by C-G formula based on SCr of 1.75 mg/dL (H)). Liver Function Tests: No results for input(s): AST, ALT, ALKPHOS, BILITOT, PROT, ALBUMIN in the last 168 hours. No results for input(s): LIPASE, AMYLASE in the last 168 hours. No results for input(s): AMMONIA in the last 168 hours. Coagulation Profile: No results for input(s): INR, PROTIME in the last 168 hours. Cardiac Enzymes: No results for input(s): CKTOTAL, CKMB, CKMBINDEX, TROPONINI in the last 168 hours. BNP (last 3 results) Recent Labs    12/11/17 1522  PROBNP 271   HbA1C: No results for input(s): HGBA1C in the last 72 hours. CBG: Recent Labs  Lab 05/30/18 1201 05/30/18 1630 05/30/18 2128 05/31/18 0718 05/31/18 1200  GLUCAP 160* 150* 242* 179* 200*   Lipid Profile: No results for input(s): CHOL, HDL, LDLCALC, TRIG, CHOLHDL, LDLDIRECT in the last 72 hours. Thyroid Function Tests: Recent Labs    05/30/18 1429  FREET4 0.42*   Anemia Panel: No results for input(s): VITAMINB12, FOLATE, FERRITIN, TIBC, IRON, RETICCTPCT in the last 72 hours. Sepsis Labs: No results for input(s): PROCALCITON, LATICACIDVEN in the last 168 hours.  No results found for this or any previous visit (from the past 240 hour(s)).       Radiology Studies: No results found.      Scheduled Meds: . amiodarone  200 mg Oral Daily  . aspirin EC  81 mg Oral QPC breakfast  . atorvastatin  80 mg Oral QHS  . dabigatran  150 mg Oral Q12H  . docusate sodium  100 mg Oral QPC breakfast  . furosemide  20 mg Oral Daily  . gabapentin  300 mg Oral TID  . insulin aspart  0-15 Units Subcutaneous TID WC  . insulin glargine  65 Units Subcutaneous QHS  . magnesium oxide  400 mg Oral QPC breakfast  . metoprolol tartrate  75 mg Oral BID  . mirabegron ER  25 mg Oral Daily  . nitroGLYCERIN  0.2 mg Transdermal Daily  . pentoxifylline  400 mg Oral TID WC  . senna  1 tablet Oral BID   Continuous Infusions:   LOS: 3 days    Time spent: 35  minutes.     Hosie Poisson, MD [facility] Pager [phone]   If 7PM-7AM, please contact night-coverage [email or web address] Password [facility] 05/31/2018, 4:04 PM

## 2018-06-01 ENCOUNTER — Encounter (HOSPITAL_COMMUNITY): Payer: Self-pay | Admitting: Orthopedic Surgery

## 2018-06-01 ENCOUNTER — Ambulatory Visit (HOSPITAL_COMMUNITY): Payer: Medicare Other

## 2018-06-01 DIAGNOSIS — I96 Gangrene, not elsewhere classified: Secondary | ICD-10-CM | POA: Diagnosis not present

## 2018-06-01 DIAGNOSIS — G4733 Obstructive sleep apnea (adult) (pediatric): Secondary | ICD-10-CM | POA: Diagnosis not present

## 2018-06-01 DIAGNOSIS — N189 Chronic kidney disease, unspecified: Secondary | ICD-10-CM | POA: Diagnosis not present

## 2018-06-01 DIAGNOSIS — Z89512 Acquired absence of left leg below knee: Secondary | ICD-10-CM | POA: Diagnosis not present

## 2018-06-01 DIAGNOSIS — E1151 Type 2 diabetes mellitus with diabetic peripheral angiopathy without gangrene: Secondary | ICD-10-CM | POA: Diagnosis not present

## 2018-06-01 DIAGNOSIS — I350 Nonrheumatic aortic (valve) stenosis: Secondary | ICD-10-CM | POA: Diagnosis not present

## 2018-06-01 DIAGNOSIS — R2681 Unsteadiness on feet: Secondary | ICD-10-CM | POA: Diagnosis not present

## 2018-06-01 DIAGNOSIS — K5900 Constipation, unspecified: Secondary | ICD-10-CM | POA: Diagnosis not present

## 2018-06-01 DIAGNOSIS — I10 Essential (primary) hypertension: Secondary | ICD-10-CM | POA: Diagnosis not present

## 2018-06-01 DIAGNOSIS — I2510 Atherosclerotic heart disease of native coronary artery without angina pectoris: Secondary | ICD-10-CM | POA: Diagnosis not present

## 2018-06-01 DIAGNOSIS — Z950 Presence of cardiac pacemaker: Secondary | ICD-10-CM | POA: Diagnosis not present

## 2018-06-01 DIAGNOSIS — R197 Diarrhea, unspecified: Secondary | ICD-10-CM | POA: Diagnosis not present

## 2018-06-01 DIAGNOSIS — R946 Abnormal results of thyroid function studies: Secondary | ICD-10-CM | POA: Diagnosis not present

## 2018-06-01 DIAGNOSIS — Z7901 Long term (current) use of anticoagulants: Secondary | ICD-10-CM | POA: Diagnosis not present

## 2018-06-01 DIAGNOSIS — E669 Obesity, unspecified: Secondary | ICD-10-CM | POA: Diagnosis not present

## 2018-06-01 DIAGNOSIS — Z952 Presence of prosthetic heart valve: Secondary | ICD-10-CM | POA: Diagnosis not present

## 2018-06-01 DIAGNOSIS — E119 Type 2 diabetes mellitus without complications: Secondary | ICD-10-CM | POA: Diagnosis not present

## 2018-06-01 DIAGNOSIS — Z4781 Encounter for orthopedic aftercare following surgical amputation: Secondary | ICD-10-CM | POA: Diagnosis not present

## 2018-06-01 DIAGNOSIS — E039 Hypothyroidism, unspecified: Secondary | ICD-10-CM | POA: Diagnosis not present

## 2018-06-01 DIAGNOSIS — R3915 Urgency of urination: Secondary | ICD-10-CM | POA: Diagnosis not present

## 2018-06-01 DIAGNOSIS — I4891 Unspecified atrial fibrillation: Secondary | ICD-10-CM | POA: Diagnosis not present

## 2018-06-01 DIAGNOSIS — M2550 Pain in unspecified joint: Secondary | ICD-10-CM | POA: Diagnosis not present

## 2018-06-01 DIAGNOSIS — Z794 Long term (current) use of insulin: Secondary | ICD-10-CM | POA: Diagnosis not present

## 2018-06-01 DIAGNOSIS — I6523 Occlusion and stenosis of bilateral carotid arteries: Secondary | ICD-10-CM | POA: Diagnosis not present

## 2018-06-01 DIAGNOSIS — R278 Other lack of coordination: Secondary | ICD-10-CM | POA: Diagnosis not present

## 2018-06-01 DIAGNOSIS — R41841 Cognitive communication deficit: Secondary | ICD-10-CM | POA: Diagnosis not present

## 2018-06-01 DIAGNOSIS — E785 Hyperlipidemia, unspecified: Secondary | ICD-10-CM | POA: Diagnosis not present

## 2018-06-01 DIAGNOSIS — I480 Paroxysmal atrial fibrillation: Secondary | ICD-10-CM | POA: Diagnosis not present

## 2018-06-01 DIAGNOSIS — Z9989 Dependence on other enabling machines and devices: Secondary | ICD-10-CM | POA: Diagnosis not present

## 2018-06-01 DIAGNOSIS — E1142 Type 2 diabetes mellitus with diabetic polyneuropathy: Secondary | ICD-10-CM | POA: Diagnosis not present

## 2018-06-01 DIAGNOSIS — E1165 Type 2 diabetes mellitus with hyperglycemia: Secondary | ICD-10-CM | POA: Diagnosis not present

## 2018-06-01 DIAGNOSIS — Z7401 Bed confinement status: Secondary | ICD-10-CM | POA: Diagnosis not present

## 2018-06-01 DIAGNOSIS — N3281 Overactive bladder: Secondary | ICD-10-CM | POA: Diagnosis not present

## 2018-06-01 DIAGNOSIS — I739 Peripheral vascular disease, unspecified: Secondary | ICD-10-CM | POA: Diagnosis not present

## 2018-06-01 DIAGNOSIS — M79605 Pain in left leg: Secondary | ICD-10-CM | POA: Diagnosis not present

## 2018-06-01 LAB — GLUCOSE, CAPILLARY
Glucose-Capillary: 123 mg/dL — ABNORMAL HIGH (ref 70–99)
Glucose-Capillary: 172 mg/dL — ABNORMAL HIGH (ref 70–99)
Glucose-Capillary: 150 mg/dL — ABNORMAL HIGH (ref 70–99)

## 2018-06-01 NOTE — Progress Notes (Signed)
PT Cancellation Note  Patient Details Name: Steven Maldonado MRN: [id number] DOB: 08/23/1941   Cancelled Treatment:    Reason Eval/Treat Not Completed: (P) Patient declined, no reason specified(Pt requesting to finish his lunch, will f/u later this pm.  )   Khristi Schiller Eli Hose 06/01/2018, 12:44 PM  Governor Rooks, PTA pager [phone]

## 2018-06-01 NOTE — Discharge Summary (Signed)
Physician Discharge Summary  Patient ID: Steven Maldonado MRN: [id number] DOB/AGE: 75/27/1942 75 y.o.  Admit date: 05/28/2018 Discharge date: 06/01/2018  Admission Diagnoses: Gangrene of left foot; cellulitis of diabetic foot; left foot osteomyelitis; hyperlipidemia; DM type II; CKD; hypothyroidism; HTN PAF; obstructive sleep apnea; CAD; obesity; aortic stenosis; S/P transcatheter aortic valve replacement; bradycardia; hx of asystole; pacemaker  Discharge Diagnoses:  Principal Problem:   S/P BKA (below knee amputation) unilateral, left (HCC) Active Problems:   Hyperlipidemia   Diabetes mellitus type 2, uncomplicated (HCC)   Chronic kidney disease   Hypothyroidism   Essential hypertension   PAF (paroxysmal atrial fibrillation) (HCC)   Obstructive sleep apnea treated with continuous positive airway pressure (CPAP)   Gangrene of left foot ([place]) same as above  Discharged Condition: stable  Hospital Course: Patient presented to [facility] on 05/28/18 for elective L BKA for gangrenous L foot with osteomyelitis by Dr. Wylene Simmer.  The patient tolerated the procedure well without complication.  He was admitted to the hospital and the Hospitalist service was consulted to help manage his preexisting comorbidities.  The patient worked well with therapy.  Stump protector was ordered and obtained.  The patient tolerated his stay well without complication.  He will be D/C'd to SNF on 06/01/18.  Consults: hospitalist service; PT/OT  Significant Diagnostic Studies: N/A  Treatments: IV hydration, antibiotics: Ancef, analgesia: acetaminophen, Vicodin and Dilaudid, cardiac meds: metoprolol and furosemide, anticoagulation: pradaxa, insulin: Humalog and Lantus,  and surgery: as stated above  Discharge Exam: Blood pressure 140/63, pulse 65, temperature 97.6 F (36.4 C), temperature source Axillary, resp. rate 15, height 5\' 11"  (1.803 m), weight 115.2 kg (254 lb), SpO2 98 %. General: WDWN patient in  NAD. Psych:  Appropriate mood and affect. Neuro:  A&O x 3, Moving all extremities, sensation intact to light touch HEENT:  EOMs intact Chest:  Even non-labored respirations Skin:  Dressing C/D/I, no rashes or lesions.  Stump protector in place Extremities: warm/dry, no visible edema, erythema or echymosis.  No lymphadenopathy. Pulses: Femoral 2+ MSK:  ROM: TKE, MMT: able to perform quad set   Disposition: Discharge disposition: 03-Skilled Nursing Facility       Discharge Instructions    Ambulatory referral to Nutrition and Diabetic Education   Complete by:  As directed    Per wife's request.   Call MD / Call 911   Complete by:  As directed    If you experience chest pain or shortness of breath, CALL 911 and be transported to the hospital emergency room.  If you develope a fever above 101 F, pus (white drainage) or increased drainage or redness at the wound, or calf pain, call your surgeon's office.   Constipation Prevention   Complete by:  As directed    Drink plenty of fluids.  Prune juice may be helpful.  You may use a stool softener, such as Colace (over the counter) 100 mg twice a day.  Use MiraLax (over the counter) for constipation as needed.   Diet - low sodium heart healthy   Complete by:  As directed    Increase activity slowly as tolerated   Complete by:  As directed    Non weight bearing   Complete by:  As directed    Stump protector on at all times, except when bathing.   Laterality:  left   Extremity:  Lower     Allergies as of 06/01/2018   No Known Allergies     Medication List  STOP taking these medications   HYDROcodone-acetaminophen 5-325 MG tablet Commonly known as:  NORCO/VICODIN     TAKE these medications   acetaminophen 500 MG tablet Commonly known as:  TYLENOL Take 1,000 mg by mouth at bedtime.   amiodarone 200 MG tablet Commonly known as:  PACERONE Take 1 tablet (200 mg total) by mouth daily.   aspirin EC 81 MG tablet Take 81 mg by  mouth daily after breakfast.   atorvastatin 80 MG tablet Commonly known as:  LIPITOR Take 80 mg by mouth at bedtime.   BASAGLAR KWIKPEN 100 UNIT/ML Sopn Inject 0.65 mLs (65 Units total) into the skin at bedtime.   CINNAMON PO Take 3,000 mg by mouth daily.   clotrimazole 1 % cream Commonly known as:  LOTRIMIN Apply 1 application topically 2 (two) times daily.   docusate sodium 100 MG capsule Commonly known as:  COLACE Take 100 mg by mouth daily after breakfast.   FREESTYLE LITE Devi 1 each by Does not apply route 2 (two) times daily.   furosemide 20 MG tablet Commonly known as:  LASIX Take 1 tablet (20 mg total) by mouth daily.   gabapentin 300 MG capsule Commonly known as:  NEURONTIN Take 1 capsule (300 mg total) by mouth 3 (three) times daily.   glipiZIDE 10 MG 24 hr tablet Commonly known as:  GLUCOTROL XL TAKE 1 TABLET (10 MG TOTAL) BY MOUTH DAILY WITH BREAKFAST.   glucose blood test strip Commonly known as:  FREESTYLE LITE 1 each by Other route 4 (four) times daily -  before meals and at bedtime.   insulin lispro 100 UNIT/ML KiwkPen Commonly known as:  HUMALOG Inject 0.05 mLs (5 Units total) into the skin 3 (three) times daily.   Insulin Pen Needle 32G X 4 MM Misc Commonly known as:  BD PEN NEEDLE NANO U/F USE 4 (FOUR) TIMES DAILY.   levothyroxine 150 MCG tablet Commonly known as:  SYNTHROID, LEVOTHROID TAKE 1 TABLET (150 MCG TOTAL) BY MOUTH DAILY. TAKE FOR 3 WEEKS THEN FOLLOW UP WITH PROVIDER   magnesium oxide 400 MG tablet Commonly known as:  MAG-OX Take 400 mg by mouth daily after breakfast.   metoprolol tartrate 50 MG tablet Commonly known as:  LOPRESSOR Take 75 mg by mouth 2 (two) times daily.   MYRBETRIQ 25 MG Tb24 tablet Generic drug:  mirabegron ER TAKE 1 TABLET BY MOUTH EVERY DAY   nitroGLYCERIN 0.2 mg/hr patch Commonly known as:  NITRODUR - Dosed in mg/24 hr Place 1 patch (0.2 mg total) onto the skin daily.   oxyCODONE 5 MG immediate  release tablet Commonly known as:  ROXICODONE Take 1 tablet (5 mg total) by mouth every 4 (four) hours as needed for up to 5 days for moderate pain or severe pain.   pentoxifylline 400 MG CR tablet Commonly known as:  TRENTAL Take 1 tablet (400 mg total) by mouth 3 (three) times daily with meals.   PRADAXA 150 MG Caps capsule Generic drug:  dabigatran Take 150 mg by mouth every 12 (twelve) hours.   sulfamethoxazole-trimethoprim 800-160 MG tablet Commonly known as:  BACTRIM DS,SEPTRA DS Take 1 tablet by mouth daily.            Discharge Care Instructions  (From admission, onward)        Start     Ordered   06/01/18 0000  Non weight bearing    Comments:  Stump protector on at all times, except when bathing.  Question Answer Comment  Laterality left   Extremity Lower      06/01/18 1154      Contact information for follow-up providers    Wylene Simmer, MD In 2 weeks.   Specialty:  Orthopedic Surgery Contact information: [address] [place] [address] [phone]            Contact information for after-discharge care    Destination    [facility] Preferred SNF .   Service:  Skilled Nursing Contact information: [facility] [place] [phone]                  Signed: Mohammed Kindle Office:  [phone]

## 2018-06-01 NOTE — Progress Notes (Signed)
[facility]AR was contacted regarding the long wait for the patient.  The patient was updated on arrival progress.  Apologies given for the delay in the PTAR service.

## 2018-06-01 NOTE — Progress Notes (Signed)
Pt not in distress, discharged to facility with belongings via PTAR.

## 2018-06-01 NOTE — Care Management Important Message (Signed)
Important Message  Patient Details  Name: Steven Maldonado MRN: [id number] Date of Birth: 1941/07/31   Medicare Important Message Given:  Yes    [name] 06/01/2018, 4:03 PM

## 2018-06-01 NOTE — Clinical Social Work Placement (Signed)
   CLINICAL SOCIAL WORK PLACEMENT  NOTE  Date:  06/01/2018  Patient Details  Name: Steven Maldonado MRN: [id number] Date of Birth: 1940/12/05  Clinical Social Work is seeking post-discharge placement for this patient at the   level of care (*CSW will initial, date and re-position this form in  chart as items are completed):      Patient/family provided with [facility] Work Department's list of facilities offering this level of care within the geographic area requested by the patient (or if unable, by the patient's family).  Yes   Patient/family informed of their freedom to choose among providers that offer the needed level of care, that participate in Medicare, Medicaid or managed care program needed by the patient, have an available bed and are willing to accept the patient.      Patient/family informed of [facility]'s ownership interest in [facility] and [facility], as well as of the fact that they are under no obligation to receive care at these facilities.  PASRR submitted to EDS on       PASRR number received on 05/29/18     Existing PASRR number confirmed on       FL2 transmitted to all facilities in geographic area requested by pt/family on 05/29/18     FL2 transmitted to all facilities within larger geographic area on       Patient informed that his/her managed care company has contracts with or will negotiate with certain facilities, including the following:        Yes   Patient/family informed of bed offers received.  Patient chooses bed at [place] [place], [place]     Physician recommends and patient chooses bed at      Patient to be transferred to [place], [place] on 06/01/18.  Patient to be transferred to facility by PTAR     Patient family notified on 06/01/18 of transfer.  Name of family member notified:  [name] (spouse)     PHYSICIAN Please prepare priority discharge summary, including medications, Please prepare  prescriptions, Please sign FL2     Additional Comment:    _______________________________________________ Eileen Stanford, LCSW 06/01/2018, 12:22 PM

## 2018-06-01 NOTE — Progress Notes (Signed)
Message left with Anderson Malta at [facility] regarding needing discharge summary and orders.  Anderson Malta will given Mechele Claude, PA the message.

## 2018-06-01 NOTE — Clinical Social Work Note (Signed)
Clinical Social Worker facilitated patient discharge including contacting patient family and facility to confirm patient discharge plans.  Clinical information faxed to facility and family agreeable with plan.  CSW arranged ambulance transport via [facility] to [facility].  RN to call [phone] for report prior to discharge.  Clinical Social Worker will sign off for now as social work intervention is no longer needed. Please consult [place] again if new need arises.  Loletha Grayer, MSW [phone]

## 2018-06-01 NOTE — Progress Notes (Signed)
Written and verbal instructions given to the Nurse Caryl Pina) at [facility].  Patient is awaiting [facility].

## 2018-06-01 NOTE — Progress Notes (Signed)
Subjective: 4 Days Post-Op Procedure(s) (LRB): LEFT AMPUTATION BELOW KNEE (Left)  Patient reports pain as mild to moderate.  Has obtained stump protector.  Tolerating POs well.  Admits to flatus.  Denies fever, chills, N/V, CP, SOB.  Ready to go to SNF.  Objective:   VITALS:  Temp:  [97.6 F (36.4 C)-99 F (37.2 C)] 97.6 F (36.4 C) (07/22 0503) Pulse Rate:  [58-65] 65 (07/22 0503) Resp:  [15-16] 15 (07/22 0503) BP: (125-140)/(52-63) 140/63 (07/22 0503) SpO2:  [96 %-98 %] 98 % (07/22 0503)  General: WDWN patient in NAD. Psych:  Appropriate mood and affect. Neuro:  A&O x 3, Moving all extremities, sensation intact to light touch HEENT:  EOMs intact Chest:  Even non-labored respirations Skin:  Dressing C/D/I, no rashes or lesions.  Stump protector intact Extremities: warm/dry, no visibleedema, erythema or echymosis.  No lymphadenopathy. Pulses: Femoral 2+ MSK:  ROM: TKE, MMT: able to perform quad set,    LABS Recent Labs    05/31/18 0611  HGB 9.6*  WBC 6.7  PLT 271   Recent Labs    05/29/18 0954 05/30/18 1429  NA 134* 135  K 4.1 4.2  CL 101 100  CO2 26 27  BUN 20 21  CREATININE 1.76* 1.75*  GLUCOSE 244* 177*   No results for input(s): LABPT, INR in the last 72 hours.   Assessment/Plan: 4 Days Post-Op Procedure(s) (LRB): LEFT AMPUTATION BELOW KNEE (Left)  Up with therapy NWB LE Stump protector at all times D/C to SNF when bed available.  Please call and I will place order. Scripts on chart. Plan for 2 week outpatient post-op visit with Dr. Doran Durand.  Mechele Claude [name] [facility] Office:  [phone]

## 2018-06-01 NOTE — Progress Notes (Signed)
Physical Therapy Treatment Patient Details Name: Steven Maldonado MRN: [id number] DOB: 05-Apr-1941 Today's Date: 06/01/2018    History of Present Illness Pt is a 77 y/o male s/p L transtibial amputation. PMH including but not limited to hypothyroidism; OSA on CPAP; s/p TAVR; pacemaker placement; PVD; afib on Pradaxa; HTN; HLD; CAD; DM; stage 3 CKD; and chronic diastolic heart failure. 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    PT Comments    Pt performed therapeutic exercises and functional mobility during session this afternoon.  Pt expressed feeling anxious to mobilize. Post session patient confident with progress and no signs of anxiety present during session.  Plan for continued strengthening and functional mobility.    Follow Up Recommendations  SNF;Other (comment)(prefer [facility])     Equipment Recommendations  None recommended by PT    Recommendations for Other Services       Precautions / Restrictions Precautions Precautions: Fall Restrictions Weight Bearing Restrictions: Yes LLE Weight Bearing: Non weight bearing    Mobility  Bed Mobility Overal bed mobility: Needs Assistance Bed Mobility: Supine to Sit;Sit to Supine     Supine to sit: Supervision;HOB elevated     General bed mobility comments: Increased time and effort to achieve sitting but did not require physical assistance to achieve sitting.    Transfers Overall transfer level: Needs assistance Equipment used: (used sara stedy to achieve standing.  ) Transfers: Sit to/from Stand Sit to Stand: Mod assist;From elevated surface         General transfer comment: Pt required moderate assistance to boost into standing.  Able to stand x 30 sec with cues for B hip extension and L knee extension in standing to avoid drawing LLE up while standing on RLE.  Pt performed x2 sit<>stand transfer from higher seated surface of stedy plates with min guard assistance.  Once stedy plates removed he required  moderate assistance to lower to seated surface and return to seated position.    Ambulation/Gait Ambulation/Gait assistance: (NT)               Stairs             Wheelchair Mobility    Modified Rankin (Stroke Patients Only)       Balance                                            Cognition Arousal/Alertness: Awake/alert Behavior During Therapy: Anxious Overall Cognitive Status: Impaired/Different from baseline Area of Impairment: Attention                   Current Attention Level: Selective           General Comments: Pt at times tangential and easily distracted. Wife reports his distractability is baseline.        Exercises General Exercises - Lower Extremity Long Arc Quad: AROM;Right;10 reps;Seated Hip Flexion/Marching: AROM;Right;10 reps;Seated Amputee Exercises Hip Flexion/Marching: AROM;Left;10 reps;Seated Knee Flexion: AROM;Strengthening;Left;10 reps;Supine Knee Extension: AROM;Strengthening;Left;10 reps;Supine Straight Leg Raises: AROM;Left;10 reps;Supine Chair Push Up: AROM;5 reps;Seated(with B UEs and RLE 2x5 reps.  )    General Comments        Pertinent Vitals/Pain Pain Assessment: Faces Faces Pain Scale: Hurts little more Pain Location: burning in L residual limb.   Pain Descriptors / Indicators: Sore;Discomfort;Burning Pain Intervention(s): Monitored during session;Repositioned    Home Living  Prior Function            PT Goals (current goals can now be found in the care plan section) Acute Rehab PT Goals Patient Stated Goal: get stronger and get a prosthesis Potential to Achieve Goals: Good Progress towards PT goals: Progressing toward goals    Frequency    Min 5X/week      PT Plan Current plan remains appropriate    Co-evaluation              AM-PAC PT "6 Clicks" Daily Activity  Outcome Measure  Difficulty turning over in bed (including adjusting  bedclothes, sheets and blankets)?: Unable Difficulty moving from lying on back to sitting on the side of the bed? : Unable Difficulty sitting down on and standing up from a chair with arms (e.g., wheelchair, bedside commode, etc,.)?: Unable Help needed moving to and from a bed to chair (including a wheelchair)?: A Lot Help needed walking in hospital room?: Total Help needed climbing 3-5 steps with a railing? : Total 6 Click Score: 7    End of Session Equipment Utilized During Treatment: Gait belt Activity Tolerance: Patient limited by pain;Patient limited by fatigue Patient left: in chair;with call bell/phone within reach;with family/visitor present Nurse Communication: Mobility status PT Visit Diagnosis: Other abnormalities of gait and mobility (R26.89);Pain Pain - Right/Left: Left Pain - part of body: Leg     Time: 5188-4166 PT Time Calculation (min) (ACUTE ONLY): 23 min  Charges:  $Therapeutic Exercise: 8-22 mins $Therapeutic Activity: 8-22 mins                    G Codes:       Steven Maldonado, PTA pager [phone]    Steven Maldonado 06/01/2018, 2:26 PM

## 2018-06-02 ENCOUNTER — Other Ambulatory Visit: Payer: Self-pay

## 2018-06-02 ENCOUNTER — Other Ambulatory Visit: Payer: Self-pay | Admitting: *Deleted

## 2018-06-02 ENCOUNTER — Encounter: Payer: Self-pay | Admitting: *Deleted

## 2018-06-02 DIAGNOSIS — G4733 Obstructive sleep apnea (adult) (pediatric): Secondary | ICD-10-CM | POA: Diagnosis not present

## 2018-06-02 DIAGNOSIS — I10 Essential (primary) hypertension: Secondary | ICD-10-CM | POA: Diagnosis not present

## 2018-06-02 DIAGNOSIS — E785 Hyperlipidemia, unspecified: Secondary | ICD-10-CM | POA: Diagnosis not present

## 2018-06-02 DIAGNOSIS — E119 Type 2 diabetes mellitus without complications: Secondary | ICD-10-CM | POA: Diagnosis not present

## 2018-06-02 DIAGNOSIS — I4891 Unspecified atrial fibrillation: Secondary | ICD-10-CM | POA: Diagnosis not present

## 2018-06-02 NOTE — Patient Outreach (Signed)
[place] [facility]) Care Management  06/02/2018  Steven Maldonado 05-Feb-1941 [id number]   CSW made an initial attempt to try and contact patient today to perform the phone assessment, as well as assess and assist with social work needs and services, without success.  CSW also attempted to contact the social worker at [facility] in [place] today to obtain an update on patient's status; however, Benjamine Mola was not available at the time of CSW's call.  Patient currently resides at [facility] to receive short-term rehabilitative services.  A HIPAA compliant message was left on voicemail for patient, as well as for Benjamine Mola at [employer].  CSW is currently awaiting a return call.  CSW will make a second outreach attempt within the next 3-4 business days, if a return call is not received from patient or Benjamine Mola in the meantime.  An Outreach Letter has also been mailed to patient's home. Nat Christen, BSW, MSW, LCSW  Licensed Education officer, [facility]  Mailing [place] [address] [place], [place] [address] Physical Address-[address], [place], [place] [address] Toll Free Main # [phone] Fax # [phone] Cell # [phone]  Office # [phone] [email or web address]

## 2018-06-03 ENCOUNTER — Encounter: Payer: Medicare Other | Admitting: *Deleted

## 2018-06-03 ENCOUNTER — Ambulatory Visit (HOSPITAL_COMMUNITY): Payer: Medicare Other

## 2018-06-03 ENCOUNTER — Telehealth: Payer: Self-pay | Admitting: Cardiology

## 2018-06-03 NOTE — Telephone Encounter (Signed)
LMOVM reminding pt to send remote transmission.   

## 2018-06-05 ENCOUNTER — Ambulatory Visit (HOSPITAL_COMMUNITY): Payer: Medicare Other

## 2018-06-08 ENCOUNTER — Other Ambulatory Visit: Payer: Self-pay | Admitting: *Deleted

## 2018-06-08 ENCOUNTER — Ambulatory Visit (HOSPITAL_COMMUNITY): Payer: Medicare Other

## 2018-06-08 NOTE — Patient Outreach (Signed)
[place] [facility]) Care Management  06/08/2018  JAN OLANO August 19, 1941 [id number]   CSW was able to meet pt at [facility] SNF where he is staying for rehab; s/p left [facility].  Pt's identity was confirmed and CSW introduced self and role to pt and gave him [facility] CM brochure.  Per pt, he is making progress with therapy and is anticipating dc home once he is able. Pt indicates he has all DME  (wheelchiar, ramp, scooter, walker, etc) at home already; "all set up".  Per SNF rep, pt is requiring a contact guard level of assist for transfers and is dependent with most ADL's.  CSW discussed with pt and SNF rep the need for him to be at a level his wife can manage at home- as she has physical issues from knee surgeries and back.   CSW encouraged pt to work hard with PT and OT and will plan to call or visit later this week for updates.   [name], MSW, [place] Worker  [place] [phone]

## 2018-06-09 DIAGNOSIS — R197 Diarrhea, unspecified: Secondary | ICD-10-CM | POA: Diagnosis not present

## 2018-06-09 DIAGNOSIS — R946 Abnormal results of thyroid function studies: Secondary | ICD-10-CM | POA: Diagnosis not present

## 2018-06-10 ENCOUNTER — Ambulatory Visit (HOSPITAL_COMMUNITY): Payer: Medicare Other

## 2018-06-11 ENCOUNTER — Other Ambulatory Visit: Payer: Self-pay | Admitting: *Deleted

## 2018-06-12 ENCOUNTER — Ambulatory Visit (HOSPITAL_COMMUNITY): Payer: Medicare Other

## 2018-06-13 DIAGNOSIS — R946 Abnormal results of thyroid function studies: Secondary | ICD-10-CM | POA: Diagnosis not present

## 2018-06-15 ENCOUNTER — Ambulatory Visit (HOSPITAL_COMMUNITY): Payer: Medicare Other

## 2018-06-16 ENCOUNTER — Other Ambulatory Visit: Payer: Self-pay | Admitting: *Deleted

## 2018-06-17 ENCOUNTER — Ambulatory Visit (HOSPITAL_COMMUNITY): Payer: Medicare Other

## 2018-06-17 NOTE — Patient Outreach (Signed)
[place] [facility]) Care Management  06/17/2018  Steven Maldonado 1941-09-14 [id number]   CSW visited pt at [facility] on 06/16/2018 and he was out of the building for his home evaluation. Per SNF rep, pt does plan to return back home  They have discussed home helper company options with them if necessary. No tentative D/C date as of yet.   CSW will make an outreach call to pt/family for dc planning/needs discussion later this week.   [name], MSW, [place] Worker  [place] [phone]

## 2018-06-19 ENCOUNTER — Other Ambulatory Visit: Payer: Self-pay | Admitting: *Deleted

## 2018-06-19 ENCOUNTER — Ambulatory Visit (HOSPITAL_COMMUNITY): Payer: Medicare Other

## 2018-06-19 NOTE — Patient Outreach (Signed)
[place] [facility]) Care Management  06/19/2018  Steven Maldonado Jun 18, 1941 [id number]   CSW spoke with SNF rep tho reports pt is progressing with PT- "he was able to hop 20 feet".  Pt is s/p BKA and will be returning home with wife at dc. No dc date identified as of yet.  CSW will touch base with wife and plan f/u call or visit to SNF next week.   [name], MSW, [place] Worker  [place] [phone]

## 2018-06-22 ENCOUNTER — Ambulatory Visit (HOSPITAL_COMMUNITY): Payer: Medicare Other

## 2018-06-22 ENCOUNTER — Telehealth: Payer: Self-pay | Admitting: Nurse Practitioner

## 2018-06-22 NOTE — Telephone Encounter (Signed)
[name] Beverly/DPR said it was requested by provider that he have a "chip" put in his new machine. She said DME said they would mail it but they have not rec'd it. Please call to advise

## 2018-06-22 NOTE — Telephone Encounter (Signed)
Spoke to wife.  His  compliance download I was able to bring up wirlessly thru airview.  He is good.  She was appreciative of call.

## 2018-06-23 ENCOUNTER — Other Ambulatory Visit: Payer: Self-pay | Admitting: *Deleted

## 2018-06-23 NOTE — Patient Outreach (Signed)
[place] [facility]) Care Management  06/23/2018  CALLIE BUNYARD [birth year] [id number]   CSW spoke with pt's wife by phone today. She reports pt goes to see the Surgeon tomorrow and will get "half of his stitches removed". Pt had a home eval last week and was found not to be rehabilitated enough for returning home yet. [name] is arranging for some home adaptations to be made; including bathroom rails and ramp adjustments. She anticipates his prosthesis to be a few more weeks before it is ready.   CSW has left a message for SNF rep for updates as well and will plan a SNF visit next week.   [name], MSW, [place] Worker  [place] [phone]

## 2018-06-24 ENCOUNTER — Ambulatory Visit (HOSPITAL_COMMUNITY): Payer: Medicare Other

## 2018-06-26 ENCOUNTER — Ambulatory Visit (INDEPENDENT_AMBULATORY_CARE_PROVIDER_SITE_OTHER): Payer: Medicare Other | Admitting: Endocrinology

## 2018-06-26 ENCOUNTER — Ambulatory Visit (HOSPITAL_COMMUNITY): Payer: Medicare Other

## 2018-06-26 ENCOUNTER — Encounter: Payer: Self-pay | Admitting: Endocrinology

## 2018-06-26 ENCOUNTER — Other Ambulatory Visit (INDEPENDENT_AMBULATORY_CARE_PROVIDER_SITE_OTHER): Payer: Medicare Other

## 2018-06-26 VITALS — BP 118/66 | HR 60 | Ht 71.0 in | Wt 245.0 lb

## 2018-06-26 DIAGNOSIS — I6523 Occlusion and stenosis of bilateral carotid arteries: Secondary | ICD-10-CM

## 2018-06-26 DIAGNOSIS — E1142 Type 2 diabetes mellitus with diabetic polyneuropathy: Secondary | ICD-10-CM | POA: Diagnosis not present

## 2018-06-26 DIAGNOSIS — E039 Hypothyroidism, unspecified: Secondary | ICD-10-CM

## 2018-06-26 DIAGNOSIS — Z794 Long term (current) use of insulin: Secondary | ICD-10-CM

## 2018-06-26 DIAGNOSIS — E1165 Type 2 diabetes mellitus with hyperglycemia: Secondary | ICD-10-CM | POA: Diagnosis not present

## 2018-06-26 DIAGNOSIS — Z89512 Acquired absence of left leg below knee: Secondary | ICD-10-CM | POA: Diagnosis not present

## 2018-06-26 DIAGNOSIS — E1151 Type 2 diabetes mellitus with diabetic peripheral angiopathy without gangrene: Secondary | ICD-10-CM | POA: Diagnosis not present

## 2018-06-26 LAB — BASIC METABOLIC PANEL
BUN: 23 mg/dL (ref 6–23)
CO2: 29 mEq/L (ref 19–32)
Calcium: 9.7 mg/dL (ref 8.4–10.5)
Chloride: 106 mEq/L (ref 96–112)
Creatinine, Ser: 1.4 mg/dL (ref 0.40–1.50)
GFR: 52.55 mL/min — ABNORMAL LOW (ref >60.00)
Glucose, Bld: 74 mg/dL (ref 70–99)
Potassium: 3.9 mEq/L (ref 3.5–5.1)
Sodium: 142 mEq/L (ref 135–145)

## 2018-06-26 LAB — T4, FREE: Free T4: 0.92 ng/dL (ref 0.60–1.60)

## 2018-06-26 LAB — TSH: TSH: 22.02 u[IU]/mL — ABNORMAL HIGH (ref 0.35–4.50)

## 2018-06-26 NOTE — Progress Notes (Signed)
Patient ID: Steven Maldonado, male   DOB: 09/12/1941, 77 y.o.   MRN: [id number]          Reason for Appointment: Consultation for Type 2 Diabetes  Referring PCP: Dena Billet   History of Present Illness:          Date of diagnosis of type 2 diabetes mellitus:  1999      Background history:   He had been treated with metformin and glipizide for several years Metformin was stopped about 4 years ago because of renal dysfunction Presumably because of poor control he was started on insulin around the year 2009 He had been mostly on Lantus which was subsequently changed to [facility] and he thinks that Lantus worked better Also has been on Humalog 3-4 times a day for some time  Recent history:   Most recent A1c is 11.6 in June 2019  INSULIN regimen is: 57 Basaglar at bedtime, Humalog 5 units 4 times a day  Non-insulin hypoglycemic drugs the patient is taking are: Glipizide ER 10 mg daily  Current management, blood sugar patterns and problems identified:  His current diabetes management includes insulin and glipizide as above  His wife was concerned about his poor diabetes control  and also having had an amputation earlier this year and is here for further management  In the past the patient was checking his blood sugars mostly once a day in the morning  Also previously would not be following his diet consistently; recently after hospitalization he is in a nursing home and he thinks he is getting relatively high fat foods at times  He was on 65 units of Basaglar at night and this was reduced to 60 units  However he still thinks that his blood sugars may get low early morning  He thinks his highest blood sugars are before suppertime        Side effects from medications have been: None  Compliance with the medical regimen: Improving     Meal times are:  Breakfast is at 8 AM  Currently meals are being provided by nursing home and he is going to be discharged on Sunday                Exercise:  Currently none  Glucose monitoring:  done 4 times a day         Glucometer:  Unknown brand at nursing home, at home has a FreeStyle meter       Blood Glucose readings by time of day and averages from meter download:  PREMEAL Breakfast Lunch Dinner Bedtime  Overall   Glucose range:  73-176  69-193  108-203  140-271   Median:     ?   Dietician visit, most recent: Uncertain  Weight history:  Wt Readings from Last 3 Encounters:  06/26/18 245 lb (111.1 kg)  05/28/18 254 lb (115.2 kg)  05/27/18 254 lb (115.2 kg)    Glycemic control:   Lab Results  Component Value Date   HGBA1C 11.6 (H) 04/17/2018   HGBA1C 8.1 (H) 11/28/2017   HGBA1C 11.2 (H) 10/13/2017   Lab Results  Component Value Date   MICROALBUR 16.5 05/01/2017   LDLCALC 93 10/13/2017   CREATININE 1.75 (H) 05/30/2018   No results found for: MICRALBCREAT  No results found for: FRUCTOSAMINE  No visits with results within 1 Week(s) from this visit.  Latest known visit with results is:  Admission on 05/28/2018, Discharged on 06/01/2018  Component Date Value Ref Range Status  .  Glucose-Capillary 05/28/2018 137* 70 - 99 mg/dL Final  . Sodium 05/28/2018 135  135 - 145 mmol/L Final  . Potassium 05/28/2018 4.9  3.5 - 5.1 mmol/L Final  . Chloride 05/28/2018 102  98 - 111 mmol/L Final   Please note change in reference range.  . CO2 05/28/2018 20* 22 - 32 mmol/L Final  . Glucose, Bld 05/28/2018 134* 70 - 99 mg/dL Final   Please note change in reference range.  . BUN 05/28/2018 24* 8 - 23 mg/dL Final   Please note change in reference range.  . Creatinine, Ser 05/28/2018 2.00* 0.61 - 1.24 mg/dL Final  . Calcium 05/28/2018 9.9  8.9 - 10.3 mg/dL Final  . GFR calc non Af Amer 05/28/2018 31* >60 mL/min Final  . GFR calc Af Amer 05/28/2018 36* >60 mL/min Final   Comment: (NOTE) The eGFR has been calculated using the CKD EPI equation. This calculation has not been validated in all clinical situations. eGFR's  persistently <60 mL/min signify possible Chronic Kidney Disease.   Georgiann Hahn gap 05/28/2018 13  5 - 15 Final   Performed at [facility] [address] [place], [place] [address]  . WBC 05/28/2018 8.6  4.0 - 10.5 K/uL Final  . RBC 05/28/2018 3.76* 4.22 - 5.81 MIL/uL Final  . Hemoglobin 05/28/2018 11.3* 13.0 - 17.0 g/dL Final  . HCT 05/28/2018 36.5* 39.0 - 52.0 % Final  . MCV 05/28/2018 97.1  78.0 - 100.0 fL Final  . MCH 05/28/2018 30.1  26.0 - 34.0 pg Final  . MCHC 05/28/2018 31.0  30.0 - 36.0 g/dL Final  . RDW 05/28/2018 13.9  11.5 - 15.5 % Final  . Platelets 05/28/2018 290  150 - 400 K/uL Final   Performed at [facility], [place] [address], [place], [place] [address]  . Glucose-Capillary 05/28/2018 127* 70 - 99 mg/dL Final  . Comment 1 05/28/2018 Notify RN   Final  . Glucose-Capillary 05/28/2018 160* 70 - 99 mg/dL Final  . Glucose-Capillary 05/28/2018 221* 70 - 99 mg/dL Final  . Comment 1 05/28/2018 Notify RN   Final  . Comment 2 05/28/2018 Document in Chart   Final  . Glucose-Capillary 05/29/2018 134* 70 - 99 mg/dL Final  . Comment 1 05/29/2018 Notify RN   Final  . Comment 2 05/29/2018 Document in Chart   Final  . Sodium 05/29/2018 134* 135 - 145 mmol/L Final  . Potassium 05/29/2018 4.1  3.5 - 5.1 mmol/L Final  . Chloride 05/29/2018 101  98 - 111 mmol/L Final   Please note change in reference range.  . CO2 05/29/2018 26  22 - 32 mmol/L Final  . Glucose, Bld 05/29/2018 244* 70 - 99 mg/dL Final   Please note change in reference range.  . BUN 05/29/2018 20  8 - 23 mg/dL Final   Please note change in reference range.  . Creatinine, Ser 05/29/2018 1.76* 0.61 - 1.24 mg/dL Final  . Calcium 05/29/2018 9.1  8.9 - 10.3 mg/dL Final  . GFR calc non Af Amer 05/29/2018 36* >60 mL/min Final  . GFR calc Af Amer 05/29/2018 42* >60 mL/min Final   Comment: (NOTE) The eGFR has been calculated using the CKD EPI equation. This calculation has not been validated in all clinical  situations. eGFR's persistently <60 mL/min signify possible Chronic Kidney Disease.   Georgiann Hahn gap 05/29/2018 7  5 - 15 Final   Performed at [facility], [place] [address], [place], [place] [address]  .  Glucose-Capillary 05/29/2018 239* 70 - 99 mg/dL Final  . Glucose-Capillary 05/29/2018 204* 70 - 99 mg/dL Final  . Glucose-Capillary 05/29/2018 189* 70 - 99 mg/dL Final  . Glucose-Capillary 05/30/2018 140* 70 - 99 mg/dL Final  . Glucose-Capillary 05/30/2018 160* 70 - 99 mg/dL Final  . Sodium 05/30/2018 135  135 - 145 mmol/L Final  . Potassium 05/30/2018 4.2  3.5 - 5.1 mmol/L Final  . Chloride 05/30/2018 100  98 - 111 mmol/L Final   Please note change in reference range.  . CO2 05/30/2018 27  22 - 32 mmol/L Final  . Glucose, Bld 05/30/2018 177* 70 - 99 mg/dL Final   Please note change in reference range.  . BUN 05/30/2018 21  8 - 23 mg/dL Final   Please note change in reference range.  . Creatinine, Ser 05/30/2018 1.75* 0.61 - 1.24 mg/dL Final  . Calcium 05/30/2018 9.1  8.9 - 10.3 mg/dL Final  . GFR calc non Af Amer 05/30/2018 37* >60 mL/min Final  . GFR calc Af Amer 05/30/2018 42* >60 mL/min Final   Comment: (NOTE) The eGFR has been calculated using the CKD EPI equation. This calculation has not been validated in all clinical situations. eGFR's persistently <60 mL/min signify possible Chronic Kidney Disease.   Georgiann Hahn gap 05/30/2018 8  5 - 15 Final   Performed at [facility], [place] [address], [place], [place] [address]  . Free T4 05/30/2018 0.42* 0.82 - 1.77 ng/dL Final   Comment: (NOTE) Biotin ingestion may interfere with free T4 tests. If the results are inconsistent with the TSH level, previous test results, or the clinical presentation, then consider biotin interference. If needed, order repeat testing after stopping biotin. Performed at [facility], [place] [address] [place], [place] [address]   . Glucose-Capillary 05/30/2018 150* 70 - 99 mg/dL Final  . WBC  05/31/2018 6.7  4.0 - 10.5 K/uL Final  . RBC 05/31/2018 3.21* 4.22 - 5.81 MIL/uL Final  . Hemoglobin 05/31/2018 9.6* 13.0 - 17.0 g/dL Final  . HCT 05/31/2018 30.3* 39.0 - 52.0 % Final  . MCV 05/31/2018 94.4  78.0 - 100.0 fL Final  . MCH 05/31/2018 29.9  26.0 - 34.0 pg Final  . MCHC 05/31/2018 31.7  30.0 - 36.0 g/dL Final  . RDW 05/31/2018 13.5  11.5 - 15.5 % Final  . Platelets 05/31/2018 271  150 - 400 K/uL Final   Performed at [facility], [place] [address] [place], [place] [address]  . Glucose-Capillary 05/30/2018 242* 70 - 99 mg/dL Final  . Glucose-Capillary 05/31/2018 179* 70 - 99 mg/dL Final  . Glucose-Capillary 05/31/2018 200* 70 - 99 mg/dL Final  . Glucose-Capillary 05/31/2018 216* 70 - 99 mg/dL Final  . Glucose-Capillary 05/31/2018 186* 70 - 99 mg/dL Final  . Glucose-Capillary 06/01/2018 123* 70 - 99 mg/dL Final  . Glucose-Capillary 06/01/2018 172* 70 - 99 mg/dL Final  . Comment 1 06/01/2018 Notify RN   Final  . Comment 2 06/01/2018 Document in Chart   Final  . Glucose-Capillary 06/01/2018 150* 70 - 99 mg/dL Final  . Comment 1 06/01/2018 Notify RN   Final  . Comment 2 06/01/2018 Document in Chart   Final    Allergies as of 06/26/2018   No Known Allergies     Medication List        Accurate as of 06/26/18  4:31 PM. Always use your most recent med list.  acetaminophen 500 MG tablet Commonly known as:  TYLENOL Take 1,000 mg by mouth at bedtime.   amiodarone 200 MG tablet Commonly known as:  PACERONE Take 1 tablet (200 mg total) by mouth daily.   aspirin EC 81 MG tablet Take 81 mg by mouth daily after breakfast.   atorvastatin 80 MG tablet Commonly known as:  LIPITOR Take 80 mg by mouth at bedtime.   BASAGLAR KWIKPEN 100 UNIT/ML Sopn Inject 0.65 mLs (65 Units total) into the skin at bedtime.   CINNAMON PO Take 3,000 mg by mouth daily.   clotrimazole 1 % cream Commonly known as:  LOTRIMIN Apply 1 application topically 2 (two) times daily.     docusate sodium 100 MG capsule Commonly known as:  COLACE Take 100 mg by mouth daily after breakfast.   FREESTYLE LITE Devi 1 each by Does not apply route 2 (two) times daily.   furosemide 20 MG tablet Commonly known as:  LASIX Take 1 tablet (20 mg total) by mouth daily.   gabapentin 300 MG capsule Commonly known as:  NEURONTIN Take 1 capsule (300 mg total) by mouth 3 (three) times daily.   glipiZIDE 10 MG 24 hr tablet Commonly known as:  GLUCOTROL XL TAKE 1 TABLET (10 MG TOTAL) BY MOUTH DAILY WITH BREAKFAST.   glucose blood test strip 1 each by Other route 4 (four) times daily -  before meals and at bedtime.   insulin lispro 100 UNIT/ML KiwkPen Commonly known as:  HUMALOG Inject 0.05 mLs (5 Units total) into the skin 3 (three) times daily.   Insulin Pen Needle 32G X 4 MM Misc USE 4 (FOUR) TIMES DAILY.   levothyroxine 150 MCG tablet Commonly known as:  SYNTHROID, LEVOTHROID TAKE 1 TABLET (150 MCG TOTAL) BY MOUTH DAILY. TAKE FOR 3 WEEKS THEN FOLLOW UP WITH PROVIDER   magnesium oxide 400 MG tablet Commonly known as:  MAG-OX Take 400 mg by mouth daily after breakfast.   metoprolol tartrate 50 MG tablet Commonly known as:  LOPRESSOR Take 75 mg by mouth 2 (two) times daily.   MYRBETRIQ 25 MG Tb24 tablet Generic drug:  mirabegron ER TAKE 1 TABLET BY MOUTH EVERY DAY   nitroGLYCERIN 0.2 mg/hr patch Commonly known as:  NITRODUR - Dosed in mg/24 hr Place 1 patch (0.2 mg total) onto the skin daily.   pentoxifylline 400 MG CR tablet Commonly known as:  TRENTAL Take 1 tablet (400 mg total) by mouth 3 (three) times daily with meals.   PRADAXA 150 MG Caps capsule Generic drug:  dabigatran Take 150 mg by mouth every 12 (twelve) hours.   sulfamethoxazole-trimethoprim 800-160 MG tablet Commonly known as:  BACTRIM DS,SEPTRA DS Take 1 tablet by mouth daily.       Allergies: No Known Allergies  Past Medical History:  Diagnosis Date  . Chronic diastolic CHF (congestive  heart failure) ([place])   . CKD (chronic kidney disease), stage III ([place])   . Constipation   . Coronary artery disease    a. Cath February 2012 in [facility], occluded RCA with collaterals  . DM type 2 (diabetes mellitus, type 2) ([place])   . Essential hypertension   . Hyperlipidemia   . Neuropathy    feet  . Pacemaker    a. symptomatic brady after TAVR s/p MDT PPM by Dr. Curt Bears 12/04/17  . Persistent atrial fibrillation ([place])   . PONV (postoperative nausea and vomiting)    after valve surgery  . PVD (peripheral vascular disease) ([place])  a. s/p R popliteal artery stenosis tx with drug-coated balloon 05/2014, followed by Dr. Fletcher Anon.  . 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 Severe aortic stenosis    a. 12/02/17: s/p TAVR  . Skin cancer   . Sleep apnea with use of continuous positive airway pressure (CPAP)    04-11-11 AHI was 32.9 and titrated to 15 cm H20, DME is [facility]  . Subclinical hypothyroidism     Past Surgical History:  Procedure Laterality Date  . ABDOMINAL ANGIOGRAM N/A 06/08/2014   Procedure: ABDOMINAL ANGIOGRAM;  Surgeon: Wellington Hampshire, MD;  Location: [facility] CATH LAB;  Service: Cardiovascular;  Laterality: N/A;  . ABDOMINAL AORTOGRAM N/A 04/09/2018   Procedure: ABDOMINAL AORTOGRAM;  Surgeon: Conrad [place], MD;  Location: [facility] CV LAB;  Service: Cardiovascular;  Laterality: N/A;  . AMPUTATION Left 04/17/2018   Procedure: LEFT FOOT 3RD RAY AMPUTATION;  Surgeon: Newt Minion, MD;  Location: [facility];  Service: Orthopedics;  Laterality: Left;  . AMPUTATION Left 05/28/2018   Procedure: LEFT AMPUTATION BELOW KNEE;  Surgeon: Wylene Simmer, MD;  Location: [facility];  Service: Orthopedics;  Laterality: Left;  . APPENDECTOMY  1965  . 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 CARDIAC CATHETERIZATION  12/2010  . CARDIOVERSION  07/2011  . CARDIOVERSION N/A 04/18/2014    Procedure: CARDIOVERSION;  Surgeon: Dorothy Spark, MD;  Location: [facility];  Service: Cardiovascular;  Laterality: N/A;  . CARDIOVERSION N/A 11/03/2015   Procedure: CARDIOVERSION;  Surgeon: Lelon Perla, MD;  Location: [facility] ENDOSCOPY;  Service: Cardiovascular;  Laterality: N/A;  . CARDIOVERSION N/A 05/08/2017   Procedure: CARDIOVERSION;  Surgeon: Dorothy Spark, MD;  Location: [facility] ENDOSCOPY;  Service: Cardiovascular;  Laterality: N/A;  . CARDIOVERSION N/A 07/28/2017   Procedure: CARDIOVERSION;  Surgeon: Dorothy Spark, MD;  Location: [facility];  Service: Cardiovascular;  Laterality: N/A;  . LOWER EXTREMITY ANGIOGRAM N/A 06/08/2014   Procedure: LOWER EXTREMITY ANGIOGRAM;  Surgeon: Wellington Hampshire, MD;  Location: [facility] CATH LAB;  Service: Cardiovascular;  Laterality: N/A;  . LOWER EXTREMITY ANGIOGRAPHY Left 04/09/2018   Procedure: Lower Extremity Angiography;  Surgeon: Conrad [place], MD;  Location: [facility] CV LAB;  Service: Cardiovascular;  Laterality: Left;  . PACEMAKER IMPLANT N/A 12/04/2017   Procedure: PACEMAKER IMPLANT;  Surgeon: Constance Haw, MD;  Location: [facility] CV LAB;  Service: Cardiovascular;  Laterality: N/A;  . PERIPHERAL VASCULAR BALLOON ANGIOPLASTY Left 04/09/2018   Procedure: PERIPHERAL VASCULAR BALLOON ANGIOPLASTY;  Surgeon: Conrad , MD;  Location: [facility] CV LAB;  Service: Cardiovascular;  Laterality: Left;  SFA  . POPLITEAL ARTERY ANGIOPLASTY Right 06/08/2014   Archie Endo 06/08/2014  . RIGHT/LEFT HEART CATH AND CORONARY ANGIOGRAPHY N/A 10/08/2017   Procedure: RIGHT/LEFT HEART CATH AND CORONARY ANGIOGRAPHY;  Surgeon: Burnell Blanks, MD;  Location: [facility] CV LAB;  Service: Cardiovascular;  Laterality: N/A;  . SKIN CANCER EXCISION Bilateral    "have had them cut off back of neck X 2; off left upper arm; right wrist, near right shoulder blade" (06/08/2014)  . TEE WITHOUT CARDIOVERSION N/A 12/02/2017   Procedure: TRANSESOPHAGEAL  ECHOCARDIOGRAM (TEE);  Surgeon: Burnell Blanks, MD;  Location: [facility];  Service: Open Heart Surgery;  Laterality: N/A;  . TEMPORARY PACEMAKER N/A 12/04/2017   Procedure: TEMPORARY PACEMAKER;  Surgeon: Leonie Man, MD;  Location: [facility] CV LAB;  Service: Cardiovascular;  Laterality: N/A;  . TRANSCATHETER AORTIC VALVE REPLACEMENT, TRANSFEMORAL N/A 12/02/2017   Procedure: TRANSCATHETER AORTIC VALVE REPLACEMENT, TRANSFEMORAL;  Surgeon: Burnell Blanks, MD;  Location: [facility];  Service: Open Heart Surgery;  Laterality: N/A;  using Edwards Sapien 3 Transcatheter Heart Valve size 39m    Family History  Problem Relation Age of Onset  . Diabetes Mother   . Heart attack Mother   . Hypertension Mother   . Heart attack Father   . Heart failure Father   . Hypertension Father   . Diabetes Father   . Diabetes Sister   . Diabetes Brother   . Diabetes Other   . Diabetes Daughter        TYPE ll  . Heart Problems Daughter   . Hypertension Sister   . Hypertension Brother   . Stroke Brother     Social History:  reports that he quit smoking about 45 years ago. His smoking use included cigarettes. He has a 28.00 pack-year smoking history. He has never used smokeless tobacco. He reports that he does not drink alcohol or use drugs.   Review of Systems  Constitutional: Negative for weight loss.  HENT: Negative for headaches.   Eyes: Negative for blurred vision.  Cardiovascular: Positive for leg swelling.  Gastrointestinal: Negative for constipation.  Endocrine: Negative for fatigue and polydipsia.  Genitourinary: Negative for frequency.  Musculoskeletal: Negative for joint pain.  Skin: Negative for rash.  Neurological: Negative for numbness.       Was having burning and numbness in his left foot prior to amputation, not symptomatic on his right foot currently.  Did not tolerate gabapentin  Psychiatric/Behavioral: Negative for insomnia.     Lipid history: He is taking 80 mg  atorvastatin because of history of CAD    Lab Results  Component Value Date   CHOL 155 10/13/2017   HDL 38 (L) 10/13/2017   LDLCALC 93 10/13/2017   TRIG 144 10/13/2017   CHOLHDL 4.1 10/13/2017           Hypertension: Has been present  BP Readings from Last 3 Encounters:  06/26/18 118/66  06/01/18 (!) 168/71  05/27/18 122/68    Most recent eye exam was in 12/18 ? Pos  Most recent foot exam:  Lab Results  Component Value Date   TSH 20.91 (H) 01/12/2018   TSH 53.16 (H) 12/15/2017   TSH 44.03 (H) 10/13/2017   FREET4 0.42 (L) 05/30/2018   FREET4 0.5 (L) 10/13/2017   FREET4 1.0 05/01/2017     Currently known complications of diabetes:  LABS:  No visits with results within 1 Week(s) from this visit.  Latest known visit with results is:  Admission on 05/28/2018, Discharged on 06/01/2018  Component Date Value Ref Range Status  . Glucose-Capillary 05/28/2018 137* 70 - 99 mg/dL Final  . Sodium 05/28/2018 135  135 - 145 mmol/L Final  . Potassium 05/28/2018 4.9  3.5 - 5.1 mmol/L Final  . Chloride 05/28/2018 102  98 - 111 mmol/L Final   Please note change in reference range.  . CO2 05/28/2018 20* 22 - 32 mmol/L Final  . Glucose, Bld 05/28/2018 134* 70 - 99 mg/dL Final   Please note change in reference range.  . BUN 05/28/2018 24* 8 - 23 mg/dL Final   Please note change in reference range.  . Creatinine, Ser 05/28/2018 2.00* 0.61 - 1.24 mg/dL Final  . Calcium 05/28/2018 9.9  8.9 - 10.3 mg/dL Final  . GFR calc non Af AWyvonnia Lora07/18/2019  31* >60 mL/min Final  . GFR calc Af Amer 05/28/2018 36* >60 mL/min Final   Comment: (NOTE) The eGFR has been calculated using the CKD EPI equation. This calculation has not been validated in all clinical situations. eGFR's persistently <60 mL/min signify possible Chronic Kidney Disease.   Georgiann Hahn gap 05/28/2018 13  5 - 15 Final   Performed at [facility] [address] [place], [place] [address]  . WBC 05/28/2018 8.6  4.0 - 10.5  K/uL Final  . RBC 05/28/2018 3.76* 4.22 - 5.81 MIL/uL Final  . Hemoglobin 05/28/2018 11.3* 13.0 - 17.0 g/dL Final  . HCT 05/28/2018 36.5* 39.0 - 52.0 % Final  . MCV 05/28/2018 97.1  78.0 - 100.0 fL Final  . MCH 05/28/2018 30.1  26.0 - 34.0 pg Final  . MCHC 05/28/2018 31.0  30.0 - 36.0 g/dL Final  . RDW 05/28/2018 13.9  11.5 - 15.5 % Final  . Platelets 05/28/2018 290  150 - 400 K/uL Final   Performed at [facility], [place] [address], [place], [place] [address]  . Glucose-Capillary 05/28/2018 127* 70 - 99 mg/dL Final  . Comment 1 05/28/2018 Notify RN   Final  . Glucose-Capillary 05/28/2018 160* 70 - 99 mg/dL Final  . Glucose-Capillary 05/28/2018 221* 70 - 99 mg/dL Final  . Comment 1 05/28/2018 Notify RN   Final  . Comment 2 05/28/2018 Document in Chart   Final  . Glucose-Capillary 05/29/2018 134* 70 - 99 mg/dL Final  . Comment 1 05/29/2018 Notify RN   Final  . Comment 2 05/29/2018 Document in Chart   Final  . Sodium 05/29/2018 134* 135 - 145 mmol/L Final  . Potassium 05/29/2018 4.1  3.5 - 5.1 mmol/L Final  . Chloride 05/29/2018 101  98 - 111 mmol/L Final   Please note change in reference range.  . CO2 05/29/2018 26  22 - 32 mmol/L Final  . Glucose, Bld 05/29/2018 244* 70 - 99 mg/dL Final   Please note change in reference range.  . BUN 05/29/2018 20  8 - 23 mg/dL Final   Please note change in reference range.  . Creatinine, Ser 05/29/2018 1.76* 0.61 - 1.24 mg/dL Final  . Calcium 05/29/2018 9.1  8.9 - 10.3 mg/dL Final  . GFR calc non Af Amer 05/29/2018 36* >60 mL/min Final  . GFR calc Af Amer 05/29/2018 42* >60 mL/min Final   Comment: (NOTE) The eGFR has been calculated using the CKD EPI equation. This calculation has not been validated in all clinical situations. eGFR's persistently <60 mL/min signify possible Chronic Kidney Disease.   Georgiann Hahn gap 05/29/2018 7  5 - 15 Final   Performed at [facility], [place] [address] [place], [place] [address]  . Glucose-Capillary  05/29/2018 239* 70 - 99 mg/dL Final  . Glucose-Capillary 05/29/2018 204* 70 - 99 mg/dL Final  . Glucose-Capillary 05/29/2018 189* 70 - 99 mg/dL Final  . Glucose-Capillary 05/30/2018 140* 70 - 99 mg/dL Final  . Glucose-Capillary 05/30/2018 160* 70 - 99 mg/dL Final  . Sodium 05/30/2018 135  135 - 145 mmol/L Final  . Potassium 05/30/2018 4.2  3.5 - 5.1 mmol/L Final  . Chloride 05/30/2018 100  98 - 111 mmol/L Final   Please note change in reference range.  . CO2 05/30/2018 27  22 - 32 mmol/L Final  . Glucose, Bld 05/30/2018 177* 70 - 99 mg/dL Final   Please note change in reference range.  . BUN 05/30/2018 21  8 - 23 mg/dL Final   Please note change in reference range.  . Creatinine, Ser 05/30/2018 1.75* 0.61 - 1.24 mg/dL Final  . Calcium 05/30/2018 9.1  8.9 - 10.3 mg/dL Final  . GFR calc non Af Amer 05/30/2018 37* >60 mL/min Final  . GFR calc Af Amer 05/30/2018 42* >60 mL/min Final   Comment: (NOTE) The eGFR has been calculated using the CKD EPI equation. This calculation has not been validated in all clinical situations. eGFR's persistently <60 mL/min signify possible Chronic Kidney Disease.   Georgiann Hahn gap 05/30/2018 8  5 - 15 Final   Performed at [facility], [place] [address], [place], [place] [address]  . Free T4 05/30/2018 0.42* 0.82 - 1.77 ng/dL Final   Comment: (NOTE) Biotin ingestion may interfere with free T4 tests. If the results are inconsistent with the TSH level, previous test results, or the clinical presentation, then consider biotin interference. If needed, order repeat testing after stopping biotin. Performed at [facility], [place] [address] [place], [place] [address]   . Glucose-Capillary 05/30/2018 150* 70 - 99 mg/dL Final  . WBC 05/31/2018 6.7  4.0 - 10.5 K/uL Final  . RBC 05/31/2018 3.21* 4.22 - 5.81 MIL/uL Final  . Hemoglobin 05/31/2018 9.6* 13.0 - 17.0 g/dL Final  . HCT 05/31/2018 30.3* 39.0 - 52.0 % Final  . MCV 05/31/2018 94.4  78.0 - 100.0 fL  Final  . MCH 05/31/2018 29.9  26.0 - 34.0 pg Final  . MCHC 05/31/2018 31.7  30.0 - 36.0 g/dL Final  . RDW 05/31/2018 13.5  11.5 - 15.5 % Final  . Platelets 05/31/2018 271  150 - 400 K/uL Final   Performed at [facility], [place] [address] [place], [place] [address]  . Glucose-Capillary 05/30/2018 242* 70 - 99 mg/dL Final  . Glucose-Capillary 05/31/2018 179* 70 - 99 mg/dL Final  . Glucose-Capillary 05/31/2018 200* 70 - 99 mg/dL Final  . Glucose-Capillary 05/31/2018 216* 70 - 99 mg/dL Final  . Glucose-Capillary 05/31/2018 186* 70 - 99 mg/dL Final  . Glucose-Capillary 06/01/2018 123* 70 - 99 mg/dL Final  . Glucose-Capillary 06/01/2018 172* 70 - 99 mg/dL Final  . Comment 1 06/01/2018 Notify RN   Final  . Comment 2 06/01/2018 Document in Chart   Final  . Glucose-Capillary 06/01/2018 150* 70 - 99 mg/dL Final  . Comment 1 06/01/2018 Notify RN   Final  . Comment 2 06/01/2018 Document in Chart   Final    Physical Examination:  BP 118/66 (BP Location: Left Arm, Patient Position: Sitting, Cuff Size: Normal)   Pulse 60   Ht '5\' 11"'  (1.803 m)   Wt 245 lb (111.1 kg)   SpO2 96%   BMI 34.17 kg/m   GENERAL:         Patient has generalized obesity.    HEENT:         Eye exam shows normal external appearance.  Fundus exam shows no retinopathy.  Oral exam shows normal mucosa .   NECK:   There is no lymphadenopathy  Thyroid is not enlarged and no nodules felt.   LUNGS:         Chest is symmetrical. Lungs are clear to auscultation.[name] Kitchen   HEART:         Heart sounds:  S1 and S2 are normal.  Short ejection systolic murmur heard., no S3 or S4.   ABDOMEN:   There is no distention present. Liver and spleen are not palpable.  No other mass or tenderness present.    NEUROLOGICAL:  Right ankle jerk absent  Diabetic Foot Exam - Simple   Simple Foot Form Diabetic Foot exam was performed with the following findings:  Yes   Visual Inspection See comments:  Yes Sensation Testing Intact to touch  and monofilament testing bilaterally:  Yes See comments:  Yes Pulse Check See comments:  Yes Comments Left foot surgically absent Mild edema with thickening of the skin on the right foot present Pedal pulses not palpable Monofilament sensation appears to be normal            Vibration sense is moderately reduced in distal first right toe.  MUSCULOSKELETAL:  There is no swelling or deformity of the peripheral joints.     EXTREMITIES:     There is 1+ right ankle edema.   SKIN:       No rash or lesions of concern.        ASSESSMENT:  Diabetes type 2, insulin requiring with moderate obesity  See history of present illness for detailed discussion of current diabetes management, blood sugar patterns and problems identified  He apparently has had poor control of his diabetes for the last few years with last A1c 11.6 Currently on relatively large amount of basal insulin with only 5 units of mealtime insulin and also oral glipizide  Since his recent nursing home admission and controlled environment he appears to have fairly good blood sugar control although has some variability because of inconsistent diet at the nursing home No consistent patterns of hyperglycemia seen, has occasional high readings at suppertime or after supper He is reporting tendency to hypoglycemia early morning but this is not documented on his nursing home record His wife is desiring nutritional consultation to help plan his meals when he is discharged home from the nursing home   Complications of diabetes: Peripheral vascular disease, 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  Renal dysfunction: Recent  creatinine 1.75, etiology unclear since he had normal urine microalbumin  PLAN:    He will stop taking Humalog insulin at bedtime  Currently since blood sugars do not show any consistent pattern will not change his mealtime insulin  Since his blood sugars are low normal fasting at times and he reports waking up with low blood sugars early morning will reduce his Basaglar by 5 units, new dose will be 55 units  May need to increase his suppertime Humalog insulin coverage if he has consistently high readings after supper and this will be reassessed once he starts going back home and is on a more consistent diet  He will be referred to the dietitian for meal planning especially since his wife is interested in getting a meal plan for diabetes  May consider a GLP-1 drug such as Ozempic or Victoza for improving weight loss, insulin sensitivity and potentially reducing vascular risk  Since glipizide it is unlikely to be  helping with his control will need to stop this  Not a candidate for metformin because of renal dysfunction  Will need to reassess urine microalbumin once blood sugar is better controlled and also recheck RENAL function today  To assess his level of control today with fructosamine  Check TSH  Patient  Instructions  INSTRUCTIONS to be followed for the diabetes  Checking blood sugars:  Check blood sugar on waking up consistently daily  Also check blood sugar before each meal and about 2 hours after dinner  INSULIN doses:  HUMALOG insulin: Not to be given at bedtime  Mealtime insulin needs to be given right before eating and not any other time  No Humalog to be given at bedtime  HUMALOG dosage to be 5 units before breakfast, 8 units before lunch and 5 units before dinner  BASAGLAR insulin: Reduce the dose to 55 units daily  STOP GLIPIZIDE  Avoid all fried food       Consultation note has been sent to the referring physician  Total visit time for  evaluation and management of multiple problems, review of all available medical records and counseling = 60 minutes  Jyl [place]ico 06/26/2018, 4:31 PM   Note: This office note was prepared with Dragon voice recognition system technology. Any transcriptional errors that result from this process are unintentional.  ADDENDUM:  Labs show TSH to be about 22 and he will need to increase his levothyroxine up to 175 mcg for now Fructosamine normal at 268 and GFR is now 52

## 2018-06-26 NOTE — Patient Instructions (Signed)
INSTRUCTIONS to be followed for the diabetes  Checking blood sugars:  Check blood sugar on waking up consistently daily  Also check blood sugar before each meal and about 2 hours after dinner  INSULIN doses:  HUMALOG insulin: Not to be given at bedtime  Mealtime insulin needs to be given right before eating and not any other time  No Humalog to be given at bedtime  HUMALOG dosage to be 5 units before breakfast, 8 units before lunch and 5 units before dinner  BASAGLAR insulin: Reduce the dose to 55 units daily  STOP GLIPIZIDE  Avoid all fried food

## 2018-06-27 LAB — FRUCTOSAMINE: Fructosamine: 268 umol/L (ref 0–285)

## 2018-06-27 NOTE — Progress Notes (Signed)
Please call his wife that his thyroid level is still significantly low, needs to be taking levothyroxine 175 mcg instead of 150 daily before breakfast.  Overall sugar control is fairly good and kidney function is improved

## 2018-06-29 ENCOUNTER — Other Ambulatory Visit: Payer: Self-pay | Admitting: *Deleted

## 2018-06-29 ENCOUNTER — Ambulatory Visit (HOSPITAL_COMMUNITY): Payer: Medicare Other

## 2018-06-29 ENCOUNTER — Ambulatory Visit (INDEPENDENT_AMBULATORY_CARE_PROVIDER_SITE_OTHER): Payer: Medicare Other | Admitting: *Deleted

## 2018-06-29 DIAGNOSIS — I442 Atrioventricular block, complete: Secondary | ICD-10-CM

## 2018-06-29 NOTE — Patient Outreach (Addendum)
[facility]) Care Management  [place]  06/29/2018   Steven Maldonado 08/21/1941 [id number]   Transition of care call   Referral received : 8/19 Referral source : [facility] LCSW  Referral reason ; DC to from [facility] on 06/28/18, S/P Left BKA  Insurance : Medicare   77 year old male with PMHx that includes but not limited to : Left foot osteomyelitis, hyperlipidemia, DM, CKD, HTN, PAF, OSA, transcatheter AVR, pacemaker.    Subjective:   Successful outreach call to patient , HIPAA identifiers confirmed x 2. Explained reason for the call and  Patient reports that he is grateful to be at home, states that he is sitting in sun room at present . Patient reports he now has his walker and using it to transfer with some assistance from wife.  Patient reports walker delivered to home on today. Patient denies pain and reports that surgical incision area looks good.    Patient reports having history of Diabetes for over 20 years, reports having a recent visit with diabetes doctor. He reports his bedtime insulin has been decreased to 55 units, and 5 units of novolog at meal times.   Patient reports checking his blood sugar about 3 times a day, states blood sugar was 67 this morning  and that was not a too low reading for him, he atet a banana and cheese reports lunch blood sugar , 126. Reinforced rule of 15 for treating low blood sugar and discussed low blood sugar reading of 70 or below should be treated. Patient reports that he keeps glucose tablets for use of low blood sugar.   Encounter Medications:  Outpatient Encounter Medications as of 06/29/2018  Medication Sig Note  . acetaminophen (TYLENOL) 500 MG tablet Take 1,000 mg by mouth at bedtime.   [name] Kitchen amiodarone (PACERONE) 200 MG tablet Take 1 tablet (200 mg total) by mouth daily.   [name] Kitchen aspirin EC 81 MG tablet Take 81 mg by mouth daily after breakfast.    . atorvastatin (LIPITOR) 80 MG tablet Take 80 mg by mouth at bedtime.    . Blood Glucose Monitoring Suppl (FREESTYLE LITE) DEVI 1 each by Does not apply route 2 (two) times daily.   [name] Kitchen CINNAMON PO Take 3,000 mg by mouth daily.  05/25/2018: 1000 mg capsules  . clotrimazole (LOTRIMIN) 1 % cream Apply 1 application topically 2 (two) times daily.   . dabigatran (PRADAXA) 150 MG CAPS capsule Take 150 mg by mouth every 12 (twelve) hours.    . docusate sodium (COLACE) 100 MG capsule Take 100 mg by mouth daily after breakfast.    . furosemide (LASIX) 20 MG tablet Take 1 tablet (20 mg total) by mouth daily.   [name] Kitchen gabapentin (NEURONTIN) 300 MG capsule Take 1 capsule (300 mg total) by mouth 3 (three) times daily.   [name] Kitchen glipiZIDE (GLUCOTROL XL) 10 MG 24 hr tablet TAKE 1 TABLET (10 MG TOTAL) BY MOUTH DAILY WITH BREAKFAST.   [name] Kitchen glucose blood (FREESTYLE LITE) test strip 1 each by Other route 4 (four) times daily -  before meals and at bedtime.   . Insulin Glargine (BASAGLAR KWIKPEN) 100 UNIT/ML SOPN Inject 0.65 mLs (65 Units total) into the skin at bedtime.   . insulin lispro (HUMALOG) 100 UNIT/ML KiwkPen Inject 0.05 mLs (5 Units total) into the skin 3 (three) times daily.   . Insulin Pen Needle (BD PEN NEEDLE NANO U/F) 32G X 4 MM MISC USE 4 (FOUR) TIMES DAILY.   [name] Kitchen  levothyroxine (SYNTHROID, LEVOTHROID) 150 MCG tablet TAKE 1 TABLET (150 MCG TOTAL) BY MOUTH DAILY. TAKE FOR 3 WEEKS THEN FOLLOW UP WITH PROVIDER   . magnesium oxide (MAG-OX) 400 MG tablet Take 400 mg by mouth daily after breakfast.    . metoprolol tartrate (LOPRESSOR) 50 MG tablet Take 75 mg by mouth 2 (two) times daily.   [name] Kitchen MYRBETRIQ 25 MG TB24 tablet TAKE 1 TABLET BY MOUTH EVERY DAY   . nitroGLYCERIN (NITRODUR - DOSED IN MG/24 HR) 0.2 mg/hr patch Place 1 patch (0.2 mg total) onto the skin daily.   . pentoxifylline (TRENTAL) 400 MG CR tablet Take 1 tablet (400 mg total) by mouth 3 (three) times daily with meals.   [name] Kitchen sulfamethoxazole-trimethoprim (BACTRIM DS,SEPTRA DS) 800-160 MG tablet Take 1 tablet by mouth daily. 05/25/2018:  Continuous   No facility-administered encounter medications on file as of 06/29/2018.   Patient was recently discharged from hospital and all medications have been reviewed.  Offered home visit for this week , patient agreeable. Patient anticipating home health visit on tomorrow.   Plan:  Will follow weekly for transition of care outreaches, will plan home visit on this week.  Provided [facility] contact information.  Will send Patient [facility] welcome letter and PCP involvement letter.  RN educated on low blood sugar treatment plan and importance of treating and rechecking. Encouraged patient to notify MD of recurrent low blood sugars of 70 or below.   [facility] CM Care Plan Problem One     Most Recent Value  Care Plan Problem One  Recent hospital admission and SNF rehab stay after after Left BKA   Role Documenting the Problem One  Care Management [place] for Problem One  Active  [facility] Long Term Goal   Patient will not experience a hospital readmission in the next 60 days   [facility] Long Term Goal Start Date  06/29/18  Interventions for Problem One Long Term Goal  Educated on transition of care and weekly outreaches, advised regarding taking medications daily as prescribed.,   [facility] CM Short Term Goal #1   Patient will reports attending all medical appointments in the next 20 days   [facility] CM Short Term Goal #1 Start Date  06/29/18  Interventions for Short Term Goal #1  ADvised patient on importance of keeping all medical appoinment, discussed transporation , ramp accessiblity and wheelchair needs and wife ability to assist .   [facility] CM Short Term Goal #2   Patient will reports continued healing at surgical site over the next 30 days   [facility] CM Short Term Goal #2 Start Date  06/29/18  Interventions for Short Term Goal #2  RN reviewed signs of infection , redness, drainage, seperation at incision , fever and to notify MD sooner, reviewed limiting site from getting bumped .        Joylene Draft, RN,  [place] Management Coordinator  [phone]- Mobile [phone]- Toll Free Main Office

## 2018-06-29 NOTE — Patient Outreach (Signed)
[facility]) Care Management  06/29/2018  Steven Maldonado 1941/02/05 [id number]   CSW spoke with pt's wife by phone who states; "I've got him in the bathroom". Pt was released to home from [facility] on Sunday, 06-28-2018.  Wife is awaiting [facility] to call for visit on Tuesday. She has had knee and back surgeries and has limitations- encouraged her to ask about bath aide as she is unable to assist with this getting him in the shower.  CSW has made referral to [facility] for post SNF follow up as appropriate.  Per wife, she does not have any concerns related to transporting; has ramp, railings, wheelchair, etc. She reports she was instructed his walker would be shipped to the home or she can pick up from the DME store on [address] in [place].  CSW offered to pick it up and drop it off today while en route to .  CSW will plan DME delivery later today.  [name], MSW, [place] Worker  [place] [phone]

## 2018-06-30 ENCOUNTER — Other Ambulatory Visit: Payer: Self-pay | Admitting: Emergency Medicine

## 2018-06-30 MED ORDER — LEVOTHYROXINE SODIUM 175 MCG PO TABS: 175.0000 ug | ORAL_TABLET | Freq: Every day | ORAL | 3 refills | Status: DC

## 2018-06-30 NOTE — Progress Notes (Signed)
Remote pacemaker transmission.   

## 2018-07-01 ENCOUNTER — Ambulatory Visit (HOSPITAL_COMMUNITY): Payer: Medicare Other

## 2018-07-01 ENCOUNTER — Telehealth: Payer: Self-pay | Admitting: Physician Assistant

## 2018-07-01 DIAGNOSIS — E669 Obesity, unspecified: Secondary | ICD-10-CM | POA: Diagnosis not present

## 2018-07-01 DIAGNOSIS — Z4781 Encounter for orthopedic aftercare following surgical amputation: Secondary | ICD-10-CM | POA: Diagnosis not present

## 2018-07-01 DIAGNOSIS — N189 Chronic kidney disease, unspecified: Secondary | ICD-10-CM | POA: Diagnosis not present

## 2018-07-01 DIAGNOSIS — I480 Paroxysmal atrial fibrillation: Secondary | ICD-10-CM | POA: Diagnosis not present

## 2018-07-01 DIAGNOSIS — I509 Heart failure, unspecified: Secondary | ICD-10-CM | POA: Diagnosis not present

## 2018-07-01 DIAGNOSIS — I130 Hypertensive heart and chronic kidney disease with heart failure and stage 1 through stage 4 chronic kidney disease, or unspecified chronic kidney disease: Secondary | ICD-10-CM | POA: Diagnosis not present

## 2018-07-01 DIAGNOSIS — Z794 Long term (current) use of insulin: Secondary | ICD-10-CM | POA: Diagnosis not present

## 2018-07-01 DIAGNOSIS — E1151 Type 2 diabetes mellitus with diabetic peripheral angiopathy without gangrene: Secondary | ICD-10-CM | POA: Diagnosis not present

## 2018-07-01 DIAGNOSIS — Z9181 History of falling: Secondary | ICD-10-CM | POA: Diagnosis not present

## 2018-07-01 DIAGNOSIS — I2510 Atherosclerotic heart disease of native coronary artery without angina pectoris: Secondary | ICD-10-CM | POA: Diagnosis not present

## 2018-07-01 DIAGNOSIS — I350 Nonrheumatic aortic (valve) stenosis: Secondary | ICD-10-CM | POA: Diagnosis not present

## 2018-07-01 DIAGNOSIS — Z6834 Body mass index (BMI) 34.0-34.9, adult: Secondary | ICD-10-CM | POA: Diagnosis not present

## 2018-07-01 DIAGNOSIS — Z950 Presence of cardiac pacemaker: Secondary | ICD-10-CM | POA: Diagnosis not present

## 2018-07-01 DIAGNOSIS — Z952 Presence of prosthetic heart valve: Secondary | ICD-10-CM | POA: Diagnosis not present

## 2018-07-01 DIAGNOSIS — E1122 Type 2 diabetes mellitus with diabetic chronic kidney disease: Secondary | ICD-10-CM | POA: Diagnosis not present

## 2018-07-01 DIAGNOSIS — Z89512 Acquired absence of left leg below knee: Secondary | ICD-10-CM | POA: Diagnosis not present

## 2018-07-01 DIAGNOSIS — G4733 Obstructive sleep apnea (adult) (pediatric): Secondary | ICD-10-CM | POA: Diagnosis not present

## 2018-07-01 DIAGNOSIS — Z7901 Long term (current) use of anticoagulants: Secondary | ICD-10-CM | POA: Diagnosis not present

## 2018-07-01 NOTE — Telephone Encounter (Signed)
Sharyn Lull from [facility] home health calling regarding this patient  Please call her back at [phone]

## 2018-07-02 ENCOUNTER — Other Ambulatory Visit: Payer: Self-pay | Admitting: *Deleted

## 2018-07-02 ENCOUNTER — Telehealth: Payer: Self-pay | Admitting: Physician Assistant

## 2018-07-02 DIAGNOSIS — N189 Chronic kidney disease, unspecified: Secondary | ICD-10-CM | POA: Diagnosis not present

## 2018-07-02 DIAGNOSIS — E1122 Type 2 diabetes mellitus with diabetic chronic kidney disease: Secondary | ICD-10-CM | POA: Diagnosis not present

## 2018-07-02 DIAGNOSIS — I509 Heart failure, unspecified: Secondary | ICD-10-CM | POA: Diagnosis not present

## 2018-07-02 DIAGNOSIS — I130 Hypertensive heart and chronic kidney disease with heart failure and stage 1 through stage 4 chronic kidney disease, or unspecified chronic kidney disease: Secondary | ICD-10-CM | POA: Diagnosis not present

## 2018-07-02 DIAGNOSIS — E1151 Type 2 diabetes mellitus with diabetic peripheral angiopathy without gangrene: Secondary | ICD-10-CM | POA: Diagnosis not present

## 2018-07-02 DIAGNOSIS — Z4781 Encounter for orthopedic aftercare following surgical amputation: Secondary | ICD-10-CM | POA: Diagnosis not present

## 2018-07-02 NOTE — Telephone Encounter (Signed)
Marcie Bal from [facility] calling to discuss this patient  Please call her at [phone]

## 2018-07-03 ENCOUNTER — Other Ambulatory Visit: Payer: Self-pay | Admitting: *Deleted

## 2018-07-03 ENCOUNTER — Encounter: Payer: Self-pay | Admitting: *Deleted

## 2018-07-03 DIAGNOSIS — E1122 Type 2 diabetes mellitus with diabetic chronic kidney disease: Secondary | ICD-10-CM | POA: Diagnosis not present

## 2018-07-03 DIAGNOSIS — I509 Heart failure, unspecified: Secondary | ICD-10-CM | POA: Diagnosis not present

## 2018-07-03 DIAGNOSIS — Z4781 Encounter for orthopedic aftercare following surgical amputation: Secondary | ICD-10-CM | POA: Diagnosis not present

## 2018-07-03 DIAGNOSIS — N189 Chronic kidney disease, unspecified: Secondary | ICD-10-CM | POA: Diagnosis not present

## 2018-07-03 DIAGNOSIS — I130 Hypertensive heart and chronic kidney disease with heart failure and stage 1 through stage 4 chronic kidney disease, or unspecified chronic kidney disease: Secondary | ICD-10-CM | POA: Diagnosis not present

## 2018-07-03 DIAGNOSIS — E1151 Type 2 diabetes mellitus with diabetic peripheral angiopathy without gangrene: Secondary | ICD-10-CM | POA: Diagnosis not present

## 2018-07-03 NOTE — Telephone Encounter (Signed)
Call placed to [facility] she is requesting home health orders to see patient 1 time a week for 1 week. 2week2 and 1week 4 until patient transfers to outpatient, also for bath they would like to see patient once this week and then 2 times a week for 4 weeks. VO order given

## 2018-07-03 NOTE — Patient Outreach (Signed)
Steven Maldonado) Care Management  07/03/2018  Steven Maldonado October 24, 1941 [id number]   CSW spoke with pt's wife by phone on 07/02/2018 who indicates pt has not had a shower in a week. She has been sponge bathing.  CSW contacted [facility] regarding the need for a bath aide and they reported the PCP office had not signed off on the orders for HHPT, RN, bath aide, etc. CSW contacted PCP office and left a message.  CSW called PCP again 07/03/2018 and spoke with Tiffany who states she will call the [facility] agency RN Sharyn Lull) and facilitate orders are completed.  CSW updated [facility] RNCM who will be visiting pt at his home today and will update pt and wife.   CSW will sign off at this time. [facility] RNCM aware and to outreach CSW if further needs arise.   [name], MSW, [place] Worker  [place] [phone]

## 2018-07-03 NOTE — Patient Outreach (Signed)
[facility]) Care Management   07/03/2018  Steven Maldonado 26-Jul-1941 [id number]  Steven Maldonado is an 77 y.o. male   Transition of care initial  home visit Transition of care call   Referral received : 8/19 Referral source : [facility] LCSW  Referral reason ; DC to from [facility] on 06/28/18, S/P Left BKA  Insurance : Medicare   77 year old male with PMHx that includes but not limited to : Left foot osteomyelitis, hyperlipidemia, DM, CKD, HTN, PAF, OSA, transcatheter AVR, pacemaker.   Subjective:  Patient report feeling pretty good on today , pleased with progress he is making and how incision site is healing and denies pain at site.   Patient discussed visit with endocrinologist and states he is working to get his blood sugar under control. Shared that his wife is helping him to eat right.  Patient discussed Dr.Kumar plans to order freestyle libre for him .   Objective:  BP 130/60 (BP Location: Left Arm, Patient Position: Sitting, Cuff Size: Large)   Pulse 62   Resp 18   Ht 1.803 m (5\' 11" )   Wt 249 lb (112.9 kg)   SpO2 97%   BMI 34.73 kg/m  Patient sitting in recliner chair, feet elevated .  Review of Systems  Constitutional: Negative.   HENT: Negative.   Eyes: Negative.   Respiratory: Negative.   Cardiovascular: Negative.   Gastrointestinal: Negative.   Genitourinary: Negative.   Musculoskeletal: Negative.   Skin: Negative.   Neurological: Negative.   Endo/Heme/Allergies: Negative.   Psychiatric/Behavioral: Negative.     Physical Exam  Constitutional: He is oriented to person, place, and time. He appears well-developed and well-nourished.  Cardiovascular: Normal rate and normal heart sounds.  Respiratory: Effort normal and breath sounds normal.  GI: Soft. Bowel sounds are normal.  Neurological: He is alert and oriented to person, place, and time.  Skin: Skin is warm and dry.     Psychiatric: He has a normal mood and affect. His behavior is normal.  Judgment and thought content normal.    Encounter Medications:   Outpatient Encounter Medications as of 07/03/2018  Medication Sig Note  . acetaminophen (TYLENOL) 500 MG tablet Take 1,000 mg by mouth at bedtime.   [name] Kitchen amiodarone (PACERONE) 200 MG tablet Take 1 tablet (200 mg total) by mouth daily.   [name] Kitchen aspirin EC 81 MG tablet Take 81 mg by mouth daily after breakfast.    . atorvastatin (LIPITOR) 80 MG tablet Take 80 mg by mouth at bedtime.   . Blood Glucose Monitoring Suppl (FREESTYLE LITE) DEVI 1 each by Does not apply route 2 (two) times daily.   [name] Kitchen CINNAMON PO Take 3,000 mg by mouth daily.  05/25/2018: 1000 mg capsules  . clotrimazole (LOTRIMIN) 1 % cream Apply 1 application topically 2 (two) times daily. (Patient not taking: Reported on 06/29/2018)   . dabigatran (PRADAXA) 150 MG CAPS capsule Take 150 mg by mouth every 12 (twelve) hours.    . docusate sodium (COLACE) 100 MG capsule Take 100 mg by mouth daily after breakfast.    . furosemide (LASIX) 20 MG tablet Take 1 tablet (20 mg total) by mouth daily.   [name] Kitchen gabapentin (NEURONTIN) 300 MG capsule Take 1 capsule (300 mg total) by mouth 3 (three) times daily. (Patient not taking: Reported on 06/29/2018)   . glipiZIDE (GLUCOTROL XL) 10 MG 24 hr tablet TAKE 1 TABLET (10 MG TOTAL) BY MOUTH DAILY WITH BREAKFAST. (Patient not taking: Reported on  06/29/2018)   . glucose blood (FREESTYLE LITE) test strip 1 each by Other route 4 (four) times daily -  before meals and at bedtime.   . Insulin Glargine (BASAGLAR KWIKPEN) 100 UNIT/ML SOPN Inject 0.65 mLs (65 Units total) into the skin at bedtime.   . insulin lispro (HUMALOG) 100 UNIT/ML KiwkPen Inject 0.05 mLs (5 Units total) into the skin 3 (three) times daily.   . Insulin Pen Needle (BD PEN NEEDLE NANO U/F) 32G X 4 MM MISC USE 4 (FOUR) TIMES DAILY.   [name] Kitchen levothyroxine (SYNTHROID, LEVOTHROID) 175 MCG tablet Take 1 tablet (175 mcg total) by mouth daily.   . magnesium oxide (MAG-OX) 400 MG tablet Take 400 mg by  mouth daily after breakfast.    . metoprolol tartrate (LOPRESSOR) 50 MG tablet Take 75 mg by mouth 2 (two) times daily.   [name] Kitchen MYRBETRIQ 25 MG TB24 tablet TAKE 1 TABLET BY MOUTH EVERY DAY   . nitroGLYCERIN (NITRODUR - DOSED IN MG/24 HR) 0.2 mg/hr patch Place 1 patch (0.2 mg total) onto the skin daily. (Patient not taking: Reported on 06/29/2018)   . pentoxifylline (TRENTAL) 400 MG CR tablet Take 1 tablet (400 mg total) by mouth 3 (three) times daily with meals. (Patient not taking: Reported on 06/29/2018)   . sulfamethoxazole-trimethoprim (BACTRIM DS,SEPTRA DS) 800-160 MG tablet Take 1 tablet by mouth daily. (Patient not taking: Reported on 06/29/2018) 05/25/2018: Continuous   No facility-administered encounter medications on file as of 07/03/2018.     Functional Status:   In your present state of health, do you have any difficulty performing the following activities: 06/29/2018 05/28/2018  Hearing? N -  Vision? N -  Difficulty concentrating or making decisions? N -  Walking or climbing stairs? Y -  Comment bka , unable to use stairs  -  Dressing or bathing? Y -  Comment wife assist  -  Doing errands, shopping? Y Y  Comment wife will assist  -  Conservation officer, nature and eating ? Y -  Using the Toilet? Y -  Comment wife assist  -  In the past six months, have you accidently leaked urine? Y -  Do you have problems with loss of bowel control? N -  Managing your Medications? N -  Managing your Finances? Y -  Comment wife assist  -  Housekeeping or managing your Housekeeping? Y -  Comment unable wife, does housekeeping  -  Some recent data might be hidden    Fall/Depression Screening:    Fall Risk  06/29/2018 03/24/2018 12/15/2017  Falls in the past year? No No No  Number falls in past yr: - - -  Injury with Fall? - - -  Risk for fall due to : Impaired balance/gait;Impaired mobility - -   PHQ 2/9 Scores 06/29/2018 01/12/2018 12/15/2017 10/13/2017 05/01/2017 05/01/2016 01/10/2014  PHQ - 2 Score 0 0 0 0 0 0 2    PHQ- 9 Score - - - - 0 - 4    Assessment:  Initial home visit patient wife present.  Patient is followed by [facility], had RN and PT visits this week and bath aide to visit on today.   Left BKA - site healing well, anticipating getting prothesis in the next month Diabetes - today's reading 142,  monitoring blood sugar 3 times a day and keeping a record,patient and wife  plans to attend nutrition class in the next few weeks. Reports episode of low blood sugar this week down to 69, will  benefit from review of rule of 15 for treating hypoglycemia, he keeps glucose tablet by his bedside. Reinforced notifying MD of increased episode of  low blood reading .   Patient has follow up visit with PCP in next week .  Plan:  Reviewed [facility] welcome packet with signed consent.  Provided patient with EMMI on preventing falls in elderly.  Provided [facility] diabetes Education book and [facility] calendar. Will plan weekly transition of care call in the next week.   [facility] CM Care Plan Problem One     Most Recent Value  Care Plan Problem One  Recent hospital admission and SNF rehab stay after after Left BKA   Role Documenting the Problem One  Care Management [place] for Problem One  Active  [facility] Long Term Goal   Patient will not experience a hospital readmission in the next 60 days   [facility] Long Term Goal Start Date  06/29/18  Interventions for Problem One Long Term Goal   Home visit completed, reinforced adherence to all MD instructions, reinforced importance of glucose control help with healing .   [facility] CM Short Term Goal #1   Patient will reports attending all medical appointments in the next 20 days   [facility] CM Short Term Goal #1 Start Date  06/29/18  Interventions for Short Term Goal #1  Reviewed upcoming appointments with PCP , and ortho encouraged regarding attending all visits   [facility] CM Short Term Goal #2   Patient will reports continued healing at surgical site over the next 30 days   [facility] CM  Short Term Goal #2 Start Date  06/29/18  Interventions for Short Term Goal #2  Site  reviewed , reinforced signs of infection to notify MD of.     [facility] CM Care Plan Problem Two     Most Recent Value  Care Plan Problem Two  Diabetes with A1c elevated at 11. 6 at last reading   Role Documenting the Problem Two  Care Management [place] for Problem Two  Active  Interventions for Problem Two Long Term Goal   Discussed patient recent Alc of 11.6 and review of normal range .   [facility] Long Term Goal  Patient will report decrease in Hgb A1c over the next 60 days   [facility] Long Term Goal Start Date  07/03/18  [facility] CM Short Term Goal #1   Patient will be able to report continuing to monitor blood sugar 3 times daily over the next 30 days   [facility] CM Short Term Goal #1 Start Date  07/03/18  Interventions for Short Term Goal #2   Encouraged to continue monitoring blood sugars, take record to next PCP visit ,discussed how daily record help in providing info to MD,  for treatment plan . Reviewed in [facility] calendar log for recording sugars    [facility] CM Short Term Goal #2   Patient will be able to report 2 steps in treating low blood sugar over the next 30 days   [facility] CM Short Term Goal #2 Start Date  07/03/18  Interventions for Short Term Goal #2  Reviewed low blood sugar range and rule of 15, provided [facility] diabetes book and reviewed hypoglycemia treatment .       Joylene Draft, RN, [place] Management Coordinator  [phone]- Mobile [phone]- Toll Free Main Office

## 2018-07-03 NOTE — Telephone Encounter (Signed)
Call placed to [facility] to get Home health orders

## 2018-07-06 DIAGNOSIS — Z4781 Encounter for orthopedic aftercare following surgical amputation: Secondary | ICD-10-CM | POA: Diagnosis not present

## 2018-07-06 DIAGNOSIS — I509 Heart failure, unspecified: Secondary | ICD-10-CM | POA: Diagnosis not present

## 2018-07-06 DIAGNOSIS — E1122 Type 2 diabetes mellitus with diabetic chronic kidney disease: Secondary | ICD-10-CM | POA: Diagnosis not present

## 2018-07-06 DIAGNOSIS — E1151 Type 2 diabetes mellitus with diabetic peripheral angiopathy without gangrene: Secondary | ICD-10-CM | POA: Diagnosis not present

## 2018-07-06 DIAGNOSIS — I130 Hypertensive heart and chronic kidney disease with heart failure and stage 1 through stage 4 chronic kidney disease, or unspecified chronic kidney disease: Secondary | ICD-10-CM | POA: Diagnosis not present

## 2018-07-06 DIAGNOSIS — N189 Chronic kidney disease, unspecified: Secondary | ICD-10-CM | POA: Diagnosis not present

## 2018-07-06 NOTE — Telephone Encounter (Signed)
Approved.  

## 2018-07-08 ENCOUNTER — Encounter: Payer: Self-pay | Admitting: Physician Assistant

## 2018-07-08 ENCOUNTER — Ambulatory Visit (INDEPENDENT_AMBULATORY_CARE_PROVIDER_SITE_OTHER): Payer: Medicare Other | Admitting: Physician Assistant

## 2018-07-08 ENCOUNTER — Other Ambulatory Visit: Payer: Self-pay | Admitting: *Deleted

## 2018-07-08 VITALS — BP 112/60 | HR 60 | Temp 97.6°F | Resp 16 | Ht 71.0 in

## 2018-07-08 DIAGNOSIS — Z950 Presence of cardiac pacemaker: Secondary | ICD-10-CM

## 2018-07-08 DIAGNOSIS — E785 Hyperlipidemia, unspecified: Secondary | ICD-10-CM | POA: Diagnosis not present

## 2018-07-08 DIAGNOSIS — Z89512 Acquired absence of left leg below knee: Secondary | ICD-10-CM | POA: Diagnosis not present

## 2018-07-08 DIAGNOSIS — G4733 Obstructive sleep apnea (adult) (pediatric): Secondary | ICD-10-CM | POA: Diagnosis not present

## 2018-07-08 DIAGNOSIS — I2510 Atherosclerotic heart disease of native coronary artery without angina pectoris: Secondary | ICD-10-CM

## 2018-07-08 DIAGNOSIS — I6523 Occlusion and stenosis of bilateral carotid arteries: Secondary | ICD-10-CM

## 2018-07-08 DIAGNOSIS — Z794 Long term (current) use of insulin: Secondary | ICD-10-CM

## 2018-07-08 DIAGNOSIS — E119 Type 2 diabetes mellitus without complications: Secondary | ICD-10-CM | POA: Diagnosis not present

## 2018-07-08 DIAGNOSIS — I480 Paroxysmal atrial fibrillation: Secondary | ICD-10-CM | POA: Diagnosis not present

## 2018-07-08 DIAGNOSIS — E039 Hypothyroidism, unspecified: Secondary | ICD-10-CM | POA: Diagnosis not present

## 2018-07-08 DIAGNOSIS — I739 Peripheral vascular disease, unspecified: Secondary | ICD-10-CM

## 2018-07-08 DIAGNOSIS — Z9989 Dependence on other enabling machines and devices: Secondary | ICD-10-CM | POA: Diagnosis not present

## 2018-07-08 DIAGNOSIS — Z952 Presence of prosthetic heart valve: Secondary | ICD-10-CM

## 2018-07-08 DIAGNOSIS — I10 Essential (primary) hypertension: Secondary | ICD-10-CM

## 2018-07-08 NOTE — Patient Outreach (Signed)
[facility]) Care Management  07/08/2018  Steven Maldonado 1941/05/26 [id number]   Referral received : 8/19 Referral source : [facility] LCSW  Referral reason ; DC to from [facility] on 06/28/18, S/P Left BKA  Insurance : Medicare   77 year old male with PMHx that includes but not limited to : Left foot osteomyelitis, hyperlipidemia, DM, CKD, HTN, PAF, OSA, transcatheter AVR, pacemaker.    Successful outreach call to patient , able to speak with his wife , HIPAA verified x 2 identifiers.   Wife discussed that patient is continuing to do great. She discussed making good progress with therapy in home, bath aide assisting with care and wife has ordered shower bench to make it easier care.  Patient had follow up visit with ortho on today and remainder sutures removed from stump and MD was pleased with progress per wife. Patient now has prescription for a prothesis that they will take to center on next week and anticipate to have prothesis in around a week. Wife reports patient will then have outpatient PT and orders have already been sent.   Wife discussed patient blood sugars are staying in a good range today's reading 103, not report of low blood sugar readings.   Wife denies new concerns at this time.   Plan  Will continue with transition of care program with call in the next week.    Joylene Draft, RN, [place] Management Coordinator  [phone]- Mobile [phone]- Toll Free Main Office

## 2018-07-08 NOTE — Progress Notes (Signed)
Patient ID: Steven Maldonado MRN: [id number], DOB: [birth year], 77 y.o. Date of Encounter: @DATE @  Chief Complaint:  Chief Complaint  Patient presents with  . Hospitalization Follow-up    HPI: 77 y.o. year old male  presents for hospital follow up.   I reviewed his hospital discharge summary.  Hospitalized 05/28/2018 through 06/01/2018. He had gangrene of left foot.  Left foot osteomyelitis. He underwent below-knee amputation on the left. He was discharged to skilled nursing facility on 06/01/2018.  His wife accompanies him for visit today. They reports that he stayed at the skilled nursing facility until August 18. Been home since that date. They report that he had visit with Dr. Doran Durand this morning and he removed the remainder of the stitches. He states that they go on Tuesday to get fitted for prosthesis.  He has had lots of assistance since this.  They report that he has home health nurse through [facility].  He also has someone come to help bathe him 2 times per week and that he is doing rehab 1 time per week and that that will increase to twice a week after he gets the prosthesis.  Also he is now seeing Dr. Dwyane Dee for his management of his diabetes and his thyroid.  He has appointments scheduled for diabetic educator as well as Dr. Dwyane Dee and also pacemaker check and also at neurology regarding follow-up for his sleep apnea.  He has no other specific concerns that need to be addressed today.  Feels that he is stable.   Past Medical History:  Diagnosis Date  . Chronic diastolic CHF (congestive heart failure) ([place])   . CKD (chronic kidney disease), stage III ([place])   . Constipation   . Coronary artery disease    a. Cath February 2012 in [place] [facility], occluded RCA with collaterals  . DM type 2 (diabetes mellitus, type 2) ([place])   . Essential hypertension   . Hyperlipidemia   . Neuropathy    feet  . Pacemaker    a. symptomatic brady after TAVR s/p MDT PPM by Dr. Curt Bears 12/04/17  .  Persistent atrial fibrillation ([place])   . PONV (postoperative nausea and vomiting)    after valve surgery  . PVD (peripheral vascular disease) ([place])    a. s/p R popliteal artery stenosis tx with drug-coated balloon 05/2014, followed by Dr. Fletcher Anon.  . 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 Severe aortic stenosis    a. 12/02/17: s/p TAVR  . Skin cancer   . Sleep apnea with use of continuous positive airway pressure (CPAP)    04-11-11 AHI was 32.9 and titrated to 15 cm H20, DME is [facility]  . Subclinical hypothyroidism      Home Meds: Outpatient Medications Prior to Visit  Medication Sig Dispense Refill  . amiodarone (PACERONE) 200 MG tablet Take 1 tablet (200 mg total) by mouth daily. 90 tablet 3  . atorvastatin (LIPITOR) 80 MG tablet Take 80 mg by mouth at bedtime.    . Blood Glucose Monitoring Suppl (FREESTYLE LITE) DEVI 1 each by Does not apply route 2 (two) times daily. 1 each 0  . Choline Fenofibrate (FENOFIBRIC ACID) 135 MG CPDR Take 1 capsule by mouth.    [name] Kitchen CINNAMON PO Take 3,000 mg by mouth daily.     . dabigatran (PRADAXA) 150 MG CAPS capsule Take 150 mg by mouth every 12 (  twelve) hours.     . furosemide (LASIX) 20 MG tablet Take 1 tablet (20 mg total) by mouth daily. 90 tablet 3  . gabapentin (NEURONTIN) 300 MG capsule Take 1 capsule (300 mg total) by mouth 3 (three) times daily. 90 capsule 5  . glucose blood (FREESTYLE LITE) test strip 1 each by Other route 4 (four) times daily -  before meals and at bedtime. 450 each 3  . Insulin Glargine (BASAGLAR KWIKPEN) 100 UNIT/ML SOPN Inject 0.65 mLs (65 Units total) into the skin at bedtime. (Patient taking differently: Inject 55 Units into the skin at bedtime. ) 30 mL 0  . insulin lispro (HUMALOG) 100 UNIT/ML KiwkPen Inject 0.05 mLs (5 Units total) into the skin 3 (three) times daily. 15 mL 3  . Insulin Pen Needle (BD  PEN NEEDLE NANO U/F) 32G X 4 MM MISC USE 4 (FOUR) TIMES DAILY. 1000 each 0  . levothyroxine (SYNTHROID, LEVOTHROID) 175 MCG tablet Take 1 tablet (175 mcg total) by mouth daily. 30 tablet 3  . metoprolol tartrate (LOPRESSOR) 50 MG tablet Take 75 mg by mouth 2 (two) times daily.    [name] Kitchen MYRBETRIQ 25 MG TB24 tablet TAKE 1 TABLET BY MOUTH EVERY DAY 90 tablet 0  . pentoxifylline (TRENTAL) 400 MG CR tablet Take 1 tablet (400 mg total) by mouth 3 (three) times daily with meals. 90 tablet 3  . acetaminophen (TYLENOL) 500 MG tablet Take 1,000 mg by mouth at bedtime.    [name] Kitchen aspirin EC 81 MG tablet Take 81 mg by mouth daily after breakfast.     . clotrimazole (LOTRIMIN) 1 % cream Apply 1 application topically 2 (two) times daily. (Patient not taking: Reported on 06/29/2018) 30 g 0  . docusate sodium (COLACE) 100 MG capsule Take 100 mg by mouth daily after breakfast.     . glipiZIDE (GLUCOTROL XL) 10 MG 24 hr tablet TAKE 1 TABLET (10 MG TOTAL) BY MOUTH DAILY WITH BREAKFAST. (Patient not taking: Reported on 06/29/2018) 30 tablet 5  . magnesium oxide (MAG-OX) 400 MG tablet Take 400 mg by mouth daily after breakfast.     . nitroGLYCERIN (NITRODUR - DOSED IN MG/24 HR) 0.2 mg/hr patch Place 1 patch (0.2 mg total) onto the skin daily. (Patient not taking: Reported on 06/29/2018) 30 patch 12  . sulfamethoxazole-trimethoprim (BACTRIM DS,SEPTRA DS) 800-160 MG tablet Take 1 tablet by mouth daily. (Patient not taking: Reported on 06/29/2018) 28 tablet 0   No facility-administered medications prior to visit.     Allergies: No Known Allergies  Social History   Socioeconomic History  . Marital status: Married    Spouse name: [name]  . Number of children: 1  . Years of education: 47  . Highest education level: Not on file  Occupational History  . Occupation: retired    Fish farm manager: [employer]  . Financial resource strain: Not on file  . Food insecurity:    Worry: Not on file    Inability: Not on  file  . Transportation needs:    Medical: Not on file    Non-medical: Not on file  Tobacco Use  . Smoking status: Former Smoker    Packs/day: 2.00    Years: 14.00    Pack years: 28.00    Types: Cigarettes    Last attempt to quit: 11/11/1972    Years since quitting: 45.6  . Smokeless tobacco: Never Used  Substance and Sexual Activity  . Alcohol use: No    Comment: quit in  1984  . Drug use: No  . Sexual activity: Not Currently  Lifestyle  . Physical activity:    Days per week: 0 days    Minutes per session: 0 min  . Stress: Not at all  Relationships  . Social connections:    Talks on phone: Not on file    Gets together: Not on file    Attends religious service: Not on file    Active member of club or organization: Not on file    Attends meetings of clubs or organizations: Not on file    Relationship status: Not on file  . Intimate partner violence:    Fear of current or ex partner: Not on file    Emotionally abused: Not on file    Physically abused: Not on file    Forced sexual activity: Not on file  Other Topics Concern  . 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   Patient has one child and his wife has one child.   Patient is retired.   Patient has a high school education.   Patient is right-handed.   Patient drinks very little caffeine.    Family History  Problem Relation Age of Onset  . Diabetes Mother   . Heart attack Mother   . Hypertension Mother   . Heart attack Father   . Heart failure Father   . Hypertension Father   . Diabetes Father   . Diabetes Sister   . Diabetes Brother   . Diabetes Other   . Diabetes Daughter        TYPE ll  . Heart Problems Daughter   . Hypertension Sister   . Hypertension Brother   . Stroke Brother      Review of Systems:  See HPI for pertinent ROS. All other ROS negative.    Physical Exam: Blood pressure 112/60, pulse 60, temperature 97.6 F (36.4 C),  temperature source Oral, resp. rate 16, height 5\' 11"  (1.803 m), SpO2 97 %., Body mass index is 34.73 kg/m. General: WM in wheelchair. Left BKA bandaged.Appears in no acute distress. Neck: Supple. No thyromegaly. No lymphadenopathy. Lungs: Clear bilaterally to auscultation without wheezes, rales, or rhonchi. Breathing is unlabored. Heart: RRR with S1 S2. No murmurs, rubs, or gallops. Musculoskeletal:  In wheelchair. Left BKA bandaged. Extremities/Skin:  Neuro: Alert and oriented X 3.  Psych:  Responds to questions appropriately with a normal affect.     ASSESSMENT AND PLAN:  77 y.o. year old male with   1.  Hospital discharge follow-up  1. Coronary artery disease involving native coronary artery of native heart without angina pectoris sstable.  Continue current treatment.  Per cardiology.  2. PAD (peripheral artery disease) (HCC) Stable.  3. Essential hypertension Blood pressure is currently controlled.  Continue current medications.  4. PAF (paroxysmal atrial fibrillation) ([place]) This is currently stable.  Continue current treatment.  Follow-up management with cardiology.  5. Obstructive sleep apnea treated with continuous positive airway pressure (CPAP) Is treated with CPAP.  He does have follow-up with neurology who manages this.  6. Type 2 diabetes mellitus without complication, with long-term current use of insulin ([place]) Is now managed by Dr. Dwyane Dee.  He does have follow-up scheduled with him.  7. Hypothyroidism, unspecified type Is now managed by Dr. Dwyane Dee.  He does have scheduled f/u.  8. Hyperlipidemia, unspecified hyperlipidemia type He is on statin.  Continue current treatment.  9. S/P  TAVR (transcatheter aortic valve replacement) This is stable.  Managed by cardiology.  10. Pacemaker his is stable.  Managed by cardiology.    11. S/P BKA (below knee amputation) unilateral, left ([place]) Now managed by Dr. Doran Durand.   [address] [place], [place],  [facility] 07/08/2018 5:15 PM

## 2018-07-09 DIAGNOSIS — Z4781 Encounter for orthopedic aftercare following surgical amputation: Secondary | ICD-10-CM | POA: Diagnosis not present

## 2018-07-09 DIAGNOSIS — N189 Chronic kidney disease, unspecified: Secondary | ICD-10-CM | POA: Diagnosis not present

## 2018-07-09 DIAGNOSIS — E1151 Type 2 diabetes mellitus with diabetic peripheral angiopathy without gangrene: Secondary | ICD-10-CM | POA: Diagnosis not present

## 2018-07-09 DIAGNOSIS — E1122 Type 2 diabetes mellitus with diabetic chronic kidney disease: Secondary | ICD-10-CM | POA: Diagnosis not present

## 2018-07-09 DIAGNOSIS — I509 Heart failure, unspecified: Secondary | ICD-10-CM | POA: Diagnosis not present

## 2018-07-09 DIAGNOSIS — I130 Hypertensive heart and chronic kidney disease with heart failure and stage 1 through stage 4 chronic kidney disease, or unspecified chronic kidney disease: Secondary | ICD-10-CM | POA: Diagnosis not present

## 2018-07-15 ENCOUNTER — Other Ambulatory Visit: Payer: Self-pay | Admitting: *Deleted

## 2018-07-15 DIAGNOSIS — I130 Hypertensive heart and chronic kidney disease with heart failure and stage 1 through stage 4 chronic kidney disease, or unspecified chronic kidney disease: Secondary | ICD-10-CM | POA: Diagnosis not present

## 2018-07-15 DIAGNOSIS — E1122 Type 2 diabetes mellitus with diabetic chronic kidney disease: Secondary | ICD-10-CM | POA: Diagnosis not present

## 2018-07-15 DIAGNOSIS — I509 Heart failure, unspecified: Secondary | ICD-10-CM | POA: Diagnosis not present

## 2018-07-15 DIAGNOSIS — Z4781 Encounter for orthopedic aftercare following surgical amputation: Secondary | ICD-10-CM | POA: Diagnosis not present

## 2018-07-15 DIAGNOSIS — N189 Chronic kidney disease, unspecified: Secondary | ICD-10-CM | POA: Diagnosis not present

## 2018-07-15 DIAGNOSIS — E1151 Type 2 diabetes mellitus with diabetic peripheral angiopathy without gangrene: Secondary | ICD-10-CM | POA: Diagnosis not present

## 2018-07-15 NOTE — Patient Outreach (Signed)
[facility]) Care Management  07/15/2018  Steven Maldonado 1941-07-30 [id number]   Transition of care call   Referral received : 8/19 Referral source : [facility] LCSW  Referral reason ; DC to from [facility] on 06/28/18, S/P Left BKA  Insurance : Medicare   77 year old male with PMHx that includes but not limited to : Left foot osteomyelitis, hyperlipidemia, DM, CKD, HTN, PAF, OSA, transcatheter AVR, pacemaker.    Successful outreach call to patient, able to speak with his wife Steven Maldonado, HIPAA confirmed x 2 identifiers.   Wife discussed that patient is doing great, tolerating physical and occupational therapy home health sessions. She discussed patient is managing well with transferring to shower bench and anticipate home health bath aide final session will be on tomorrow.   Wife discussed patient blood sugar ranges are staying in the 100's range, reading this am 120, no low blood sugar occurrences.   Wife discussed patient has been to prothesis visit on this week and received a smaller stump shrinker in preparation for his protthesis.  Wife denies any new concerns at this time.   Plan  Will continue with weekly transition of care outreaches.   Joylene Draft, RN, [place] Management Coordinator  [phone]- Mobile [phone]- Toll Free Main Office

## 2018-07-16 DIAGNOSIS — E1122 Type 2 diabetes mellitus with diabetic chronic kidney disease: Secondary | ICD-10-CM | POA: Diagnosis not present

## 2018-07-16 DIAGNOSIS — Z4781 Encounter for orthopedic aftercare following surgical amputation: Secondary | ICD-10-CM | POA: Diagnosis not present

## 2018-07-16 DIAGNOSIS — E1151 Type 2 diabetes mellitus with diabetic peripheral angiopathy without gangrene: Secondary | ICD-10-CM | POA: Diagnosis not present

## 2018-07-16 DIAGNOSIS — I130 Hypertensive heart and chronic kidney disease with heart failure and stage 1 through stage 4 chronic kidney disease, or unspecified chronic kidney disease: Secondary | ICD-10-CM | POA: Diagnosis not present

## 2018-07-16 DIAGNOSIS — I509 Heart failure, unspecified: Secondary | ICD-10-CM | POA: Diagnosis not present

## 2018-07-16 DIAGNOSIS — N189 Chronic kidney disease, unspecified: Secondary | ICD-10-CM | POA: Diagnosis not present

## 2018-07-20 DIAGNOSIS — Z23 Encounter for immunization: Secondary | ICD-10-CM | POA: Diagnosis not present

## 2018-07-21 DIAGNOSIS — I509 Heart failure, unspecified: Secondary | ICD-10-CM | POA: Diagnosis not present

## 2018-07-21 DIAGNOSIS — E1151 Type 2 diabetes mellitus with diabetic peripheral angiopathy without gangrene: Secondary | ICD-10-CM | POA: Diagnosis not present

## 2018-07-21 DIAGNOSIS — Z4781 Encounter for orthopedic aftercare following surgical amputation: Secondary | ICD-10-CM | POA: Diagnosis not present

## 2018-07-21 DIAGNOSIS — I130 Hypertensive heart and chronic kidney disease with heart failure and stage 1 through stage 4 chronic kidney disease, or unspecified chronic kidney disease: Secondary | ICD-10-CM | POA: Diagnosis not present

## 2018-07-21 DIAGNOSIS — N189 Chronic kidney disease, unspecified: Secondary | ICD-10-CM | POA: Diagnosis not present

## 2018-07-21 DIAGNOSIS — E1122 Type 2 diabetes mellitus with diabetic chronic kidney disease: Secondary | ICD-10-CM | POA: Diagnosis not present

## 2018-07-22 DIAGNOSIS — E1151 Type 2 diabetes mellitus with diabetic peripheral angiopathy without gangrene: Secondary | ICD-10-CM | POA: Diagnosis not present

## 2018-07-22 DIAGNOSIS — I509 Heart failure, unspecified: Secondary | ICD-10-CM | POA: Diagnosis not present

## 2018-07-22 DIAGNOSIS — I130 Hypertensive heart and chronic kidney disease with heart failure and stage 1 through stage 4 chronic kidney disease, or unspecified chronic kidney disease: Secondary | ICD-10-CM | POA: Diagnosis not present

## 2018-07-22 DIAGNOSIS — Z4781 Encounter for orthopedic aftercare following surgical amputation: Secondary | ICD-10-CM | POA: Diagnosis not present

## 2018-07-22 DIAGNOSIS — N189 Chronic kidney disease, unspecified: Secondary | ICD-10-CM | POA: Diagnosis not present

## 2018-07-22 DIAGNOSIS — E1122 Type 2 diabetes mellitus with diabetic chronic kidney disease: Secondary | ICD-10-CM | POA: Diagnosis not present

## 2018-07-23 ENCOUNTER — Other Ambulatory Visit: Payer: Self-pay | Admitting: *Deleted

## 2018-07-23 DIAGNOSIS — N189 Chronic kidney disease, unspecified: Secondary | ICD-10-CM | POA: Diagnosis not present

## 2018-07-23 DIAGNOSIS — I130 Hypertensive heart and chronic kidney disease with heart failure and stage 1 through stage 4 chronic kidney disease, or unspecified chronic kidney disease: Secondary | ICD-10-CM | POA: Diagnosis not present

## 2018-07-23 DIAGNOSIS — I509 Heart failure, unspecified: Secondary | ICD-10-CM | POA: Diagnosis not present

## 2018-07-23 DIAGNOSIS — Z4781 Encounter for orthopedic aftercare following surgical amputation: Secondary | ICD-10-CM | POA: Diagnosis not present

## 2018-07-23 DIAGNOSIS — E1151 Type 2 diabetes mellitus with diabetic peripheral angiopathy without gangrene: Secondary | ICD-10-CM | POA: Diagnosis not present

## 2018-07-23 DIAGNOSIS — E1122 Type 2 diabetes mellitus with diabetic chronic kidney disease: Secondary | ICD-10-CM | POA: Diagnosis not present

## 2018-07-23 NOTE — Patient Outreach (Signed)
[facility]) Care Management  07/23/2018  Steven Maldonado [birth year] [id number]   Transition of care call  Successful outreach call to patient , spoke with wife Steven Maldonado HIPAA confirmed x 2 identifiers.  Wife reports that patient is doing really good, he is tolerating mobility in home walker and hopping on right leg and propelling self in wheelchair outside.  Patient and wife have attended support group meeting for amputation on last week and has plans for another outing on this week.  Patient is still followed by home health PT, bath aide.  Wife reports patient blood sugar today was 92, and have been running great , no problems with low blood sugar episodes.  Patient has appointment on next week for prothesis fitting and to get final stump shrinker .   Wife denies any new concerns.   Plan  Will plan final transition of care call in the next week.    Joylene Draft, RN, [place] Management Coordinator  [phone]- Mobile [phone]- Toll Free Main Office

## 2018-07-24 LAB — CUP PACEART REMOTE DEVICE CHECK
Battery Remaining Longevity: 127 mo
Battery Voltage: 3.04 V
Brady Statistic AP VP Percent: 58.49 %
Brady Statistic AP VS Percent: 0.01 %
Brady Statistic AS VP Percent: 41.47 %
Brady Statistic AS VS Percent: 0.02 %
Brady Statistic RA Percent Paced: 58.49 %
Brady Statistic RV Percent Paced: 99.96 %
Date Time Interrogation Session: 20190819042030
Implantable Lead Implant Date: 20190124
Implantable Lead Implant Date: 20190124
Implantable Lead Location: 753859
Implantable Lead Location: 753860
Implantable Lead Model: 5076
Implantable Lead Model: 5076
Implantable Pulse Generator Implant Date: 20190124
Lead Channel Impedance Value: 323 Ohm
Lead Channel Impedance Value: 342 Ohm
Lead Channel Impedance Value: 437 Ohm
Lead Channel Impedance Value: 475 Ohm
Lead Channel Pacing Threshold Amplitude: 0.875 V
Lead Channel Pacing Threshold Amplitude: 0.875 V
Lead Channel Pacing Threshold Pulse Width: 0.4 ms
Lead Channel Pacing Threshold Pulse Width: 0.4 ms
Lead Channel Sensing Intrinsic Amplitude: 19 mV
Lead Channel Sensing Intrinsic Amplitude: 2.125 mV
Lead Channel Sensing Intrinsic Amplitude: 2.125 mV
Lead Channel Sensing Intrinsic Amplitude: 24.875 mV
Lead Channel Setting Pacing Amplitude: 2 V
Lead Channel Setting Pacing Amplitude: 2.5 V
Lead Channel Setting Pacing Pulse Width: 0.4 ms
Lead Channel Setting Sensing Sensitivity: 4 mV

## 2018-07-25 ENCOUNTER — Other Ambulatory Visit: Payer: Self-pay | Admitting: Physician Assistant

## 2018-07-28 ENCOUNTER — Encounter: Payer: Medicare Other | Attending: Physician Assistant | Admitting: Registered"

## 2018-07-28 ENCOUNTER — Encounter: Payer: Self-pay | Admitting: Registered"

## 2018-07-28 DIAGNOSIS — Z713 Dietary counseling and surveillance: Secondary | ICD-10-CM | POA: Insufficient documentation

## 2018-07-28 DIAGNOSIS — E1165 Type 2 diabetes mellitus with hyperglycemia: Secondary | ICD-10-CM | POA: Insufficient documentation

## 2018-07-28 DIAGNOSIS — Z794 Long term (current) use of insulin: Secondary | ICD-10-CM | POA: Insufficient documentation

## 2018-07-28 DIAGNOSIS — E11649 Type 2 diabetes mellitus with hypoglycemia without coma: Secondary | ICD-10-CM

## 2018-07-28 NOTE — Progress Notes (Signed)
Diabetes Self-Management Education  Visit Type: First/Initial  Appt. Start Time: 1100 Appt. End Time: 1205  07/28/2018  [name], identified by name and date of birth, is a 77 y.o. male with a diagnosis of Diabetes: Type 2.   This patient is accompanied in the office by his spouse.  ASSESSMENT Pt states with recent health problems and amputation, he is very motivated to do what he can to manage his diabetes. Pt states he is also motivated by desire to help his wife due to her multiple surgeries and health issues.   Pt states the only time he had hypoglycemia is during 4-6 am time-window, wakes up clammy. Pt states 15 g carbohydrate treatment. Pt's spouse states she observes this mostly when they have a light dinner with just meat and vegetables.   Pt's spouse states she tries to prepare meals for patient based on his pre-meal blood sugar and will include grapes or apple with lunch if pre-meal BG is less than 100 mg/dL.  Pt states they eat out at [employer] (salads), and [employer], he enjoys [employer], but spouse states he had a high reading after that meal so they avoid it now. Pt states he will take the skin off chicken before eating it. The high fat burgers may be causing some high BG readings. Pt states that sausage in the morning doesn't affect blood sugar, but the serving size is small.  Pt states he has been very consistent with his physical therapy exercises with resistance bands and hopping exercises and building up some strength. Pt states he has a goal to be able to walk 1 mile per day after getting prosthetic.   Diabetes Self-Management Education - 07/28/18 1211      Visit Information   Visit Type  First/Initial      Initial Visit   Diabetes Type  Type 2    Are you currently following a meal plan?  No    Are you taking your medications as prescribed?  Yes    Date Diagnosed  [place]   How would you rate your overall health?  Good      Psychosocial Assessment   Patient Belief/Attitude about Diabetes  Motivated to manage diabetes    How often do you need to have someone help you when you read instructions, pamphlets, or other written materials from your doctor or pharmacy?  1 - Never    What is the last grade level you completed in school?  12      Complications   Last HgB A1C per patient/outside source  11.6 %    How often do you check your blood sugar?  3-4 times/day    Fasting Blood glucose range (mg/dL)  70-129    Number of hypoglycemic episodes per month  3    Can you tell when your blood sugar is low?  Yes    What do you do if your blood sugar is low?  3-4 glucose tablets    Number of hyperglycemic episodes per week  0    Have you had a dilated eye exam in the past 12 months?  Yes    Have you had a dental exam in the past 12 months?  No    Are you checking your feet?  Yes    How many days per week are you checking your feet?  7      Dietary Intake   Breakfast  atkins shake, 1/2  banana    Snack (morning)  none    Lunch  [employer] Beef n cheddar, diet coke    Snack (afternoon)  none    Dinner  fish, vegetables    Snack (evening)  peanut butter, 6-7 crackers (usually just PB, but BG was low end of normal before bed    Beverage(s)  water, diet drinks      Exercise   Exercise Type  Light (walking / raking leaves)    How many days per week to you exercise?  7    How many minutes per day do you exercise?  20    Total minutes per week of exercise  140      Patient Education   Previous Diabetes Education  Yes (please comment)   has had 5 visits, 4 yrs ago last time   Nutrition management   Role of diet in the treatment of diabetes and the relationship between the three main macronutrients and blood glucose level;Reviewed blood glucose goals for pre and post meals and how to evaluate the patients' food intake on their blood glucose level.    Physical activity and exercise   Role of exercise on diabetes management,  blood pressure control and cardiac health.    Monitoring  Purpose and frequency of SMBG.      Individualized Goals (developed by patient)   Nutrition  General guidelines for healthy choices and portions discussed    Physical Activity  Exercise 5-7 days per week    Monitoring   test my blood glucose as discussed    Reducing Risk  increase portions of healthy fats      Outcomes   Expected Outcomes  Demonstrated interest in learning. Expect positive outcomes    Future DMSE  2 months    Program Status  Completed      Individualized Plan for Diabetes Self-Management Training:   Learning Objective:  Patient will have a greater understanding of diabetes self-management. Patient education plan is to attend individual and/or group sessions per assessed needs and concerns.  Patient Instructions  Consider having fish 2-3 x per week Consider when you eat fruit include protein, nuts & nut butter is a good option Aim to eat balanced meals A general guideline for carbs per meals is about 45-60 grams. Be sure to include protein with meals. Continue with your physical activity. Continue getting plenty of sleep. You can check your blood sugar 2 hours after a meal to know how it affected your blood sugar. Continue having a bedtime snack to avoid low blood sugar during the night. Continue checking your blood sugar as directed by your doctor.  Expected Outcomes:  Demonstrated interest in learning. Expect positive outcomes  Education material provided: My Plate, DM Medications list  If problems or questions, patient to contact team via:  Phone  Future DSME appointment: 2 months

## 2018-07-28 NOTE — Patient Instructions (Addendum)
Consider having fish 2-3 x per week Consider when you eat fruit include protein, nuts & nut butter is a good option Aim to eat balanced meals A general guideline for carbs per meals is about 45-60 grams. Be sure to include protein with meals. Continue with your physical activity. Continue getting plenty of sleep. You can check your blood sugar 2 hours after a meal to know how it affected your blood sugar. Continue having a bedtime snack to avoid low blood sugar during the night. Continue checking your blood sugar as directed by your doctor.

## 2018-07-30 DIAGNOSIS — Z4781 Encounter for orthopedic aftercare following surgical amputation: Secondary | ICD-10-CM | POA: Diagnosis not present

## 2018-07-30 DIAGNOSIS — E1122 Type 2 diabetes mellitus with diabetic chronic kidney disease: Secondary | ICD-10-CM | POA: Diagnosis not present

## 2018-07-30 DIAGNOSIS — E1151 Type 2 diabetes mellitus with diabetic peripheral angiopathy without gangrene: Secondary | ICD-10-CM | POA: Diagnosis not present

## 2018-07-30 DIAGNOSIS — I509 Heart failure, unspecified: Secondary | ICD-10-CM | POA: Diagnosis not present

## 2018-07-30 DIAGNOSIS — I130 Hypertensive heart and chronic kidney disease with heart failure and stage 1 through stage 4 chronic kidney disease, or unspecified chronic kidney disease: Secondary | ICD-10-CM | POA: Diagnosis not present

## 2018-07-30 DIAGNOSIS — N189 Chronic kidney disease, unspecified: Secondary | ICD-10-CM | POA: Diagnosis not present

## 2018-07-31 ENCOUNTER — Other Ambulatory Visit: Payer: Self-pay | Admitting: *Deleted

## 2018-07-31 NOTE — Patient Outreach (Signed)
Steven Maldonado) Care Management  07/31/2018  Steven Maldonado 06/23/1941 [id number]   Transition of care call  Referral received : 8/19 Referral source : [facility] LCSW  Referral reason ; DC to from [facility] on 06/28/18, S/P Left BKA Insurance : Medicare  77 year old male with PMHx that includes but not limited to : Left foot osteomyelitis, hyperlipidemia, DM, CKD, HTN, PAF, OSA, transcatheter AVR, pacemaker.  Successful outreach call to patient home, able to speak with his wife, HIPAA confirmed x 2 identifiers.  Wife reports that patient continues to do well at home.  She discussed blood sugar this am was 66, patient ate his regular breakfast and plan to recheck blood sugar.  She discussed patient attended nutrition class on this week.   Wife discussed patient progress toward getting prothesis, reports cast was made on last week, and he will get sock that attaches to prothesis on next week.   Patient continues to make progress with home health therapy and mobility in home using walker and doing exercises..  Wife denies any new concerns at this time. Discussed that this is final weekly call , wife is agreeable to follow up visit in the next month.   Plan  Will plan home visit in the next month Encouraged to notify MD of increased episodes of low blood sugar, follow low blood sugar treatment plan , reinforced.    [facility] CM Care Plan Problem One     Most Recent Value  Care Plan Problem One  Recent hospital admission and SNF rehab stay after after Left BKA   Role Documenting the Problem One  Care Management [place] for Problem One  Active  [facility] Long Term Goal   Patient will not experience a hospital readmission in the next 60 days   [facility] Long Term Goal Start Date  06/29/18  Interventions for Problem One Long Term Goal  Reinforced continued following exercise plan recommended by home health , reviewed fall prevention measures keeping frequent used items  nearby, always use walker, standing a few seconds before starting to walk/hop with walker .   [facility] CM Short Term Goal #1   Patient will reports attending all medical appointments in the next 20 days   [facility] CM Short Term Goal #1 Start Date  06/29/18  [facility] CM Short Term Goal #1 Met Date  07/15/18  [facility] CM Short Term Goal #2   Patient will reports continued healing at surgical site over the next 30 days   [facility] CM Short Term Goal #2 Start Date  06/29/18  [facility] CM Short Term Goal #2 Met Date  07/23/18    [facility] CM Care Plan Problem Two     Most Recent Value  Care Plan Problem Two  Diabetes with A1c elevated at 11. 6 at last reading   Role Documenting the Problem Two  Care Management [place] for Problem Two  Active  Interventions for Problem Two Long Term Goal   Discussed notifying MD of increase in low blood sugar episodes, review of step to treating low blood sugar, encouraged having snack at bedtime and examples.    [facility] Long Term Goal  Patient will report decrease in Hgb A1c by 2 points  over the next 60 days  [goal restated ]  [facility] Long Term Goal Start Date  07/03/18  [facility] CM Short Term Goal #1   Patient will be able to report continuing to monitor blood sugar 3 times daily over  the next 30 days   [facility] CM Short Term Goal #1 Start Date  07/03/18  [facility] CM Short Term Goal #1 Met Date   07/15/18  [facility] CM Short Term Goal #2   Patient will be able to report 2 steps in treating low blood sugar over the next 30 days   [facility] CM Short Term Goal #2 Start Date  07/03/18  [facility] CM Short Term Goal #2 Met Date  07/23/18      Steven Draft, RN, [place] Management Coordinator  [phone] Mobile [phone]- [place]

## 2018-08-03 ENCOUNTER — Other Ambulatory Visit: Payer: Self-pay

## 2018-08-03 DIAGNOSIS — E118 Type 2 diabetes mellitus with unspecified complications: Principal | ICD-10-CM

## 2018-08-03 DIAGNOSIS — Z794 Long term (current) use of insulin: Principal | ICD-10-CM

## 2018-08-03 DIAGNOSIS — IMO0001 Reserved for inherently not codable concepts without codable children: Secondary | ICD-10-CM

## 2018-08-03 MED ORDER — INSULIN PEN NEEDLE 32G X 4 MM MISC: 0 refills | Status: DC

## 2018-08-03 MED ORDER — GLUCOSE BLOOD VI STRP
1.0000 | ORAL_STRIP | Freq: Three times a day (TID) | 3 refills | Status: DC
Start: 2018-08-03 — End: 2019-08-06

## 2018-08-04 ENCOUNTER — Ambulatory Visit: Payer: Medicare Other | Admitting: Physical Therapy

## 2018-08-04 DIAGNOSIS — I509 Heart failure, unspecified: Secondary | ICD-10-CM | POA: Diagnosis not present

## 2018-08-04 DIAGNOSIS — I130 Hypertensive heart and chronic kidney disease with heart failure and stage 1 through stage 4 chronic kidney disease, or unspecified chronic kidney disease: Secondary | ICD-10-CM | POA: Diagnosis not present

## 2018-08-04 DIAGNOSIS — N189 Chronic kidney disease, unspecified: Secondary | ICD-10-CM | POA: Diagnosis not present

## 2018-08-04 DIAGNOSIS — E1122 Type 2 diabetes mellitus with diabetic chronic kidney disease: Secondary | ICD-10-CM | POA: Diagnosis not present

## 2018-08-04 DIAGNOSIS — Z4781 Encounter for orthopedic aftercare following surgical amputation: Secondary | ICD-10-CM | POA: Diagnosis not present

## 2018-08-04 DIAGNOSIS — E1151 Type 2 diabetes mellitus with diabetic peripheral angiopathy without gangrene: Secondary | ICD-10-CM | POA: Diagnosis not present

## 2018-08-06 DIAGNOSIS — I509 Heart failure, unspecified: Secondary | ICD-10-CM | POA: Diagnosis not present

## 2018-08-06 DIAGNOSIS — E1122 Type 2 diabetes mellitus with diabetic chronic kidney disease: Secondary | ICD-10-CM | POA: Diagnosis not present

## 2018-08-06 DIAGNOSIS — I130 Hypertensive heart and chronic kidney disease with heart failure and stage 1 through stage 4 chronic kidney disease, or unspecified chronic kidney disease: Secondary | ICD-10-CM | POA: Diagnosis not present

## 2018-08-06 DIAGNOSIS — Z4781 Encounter for orthopedic aftercare following surgical amputation: Secondary | ICD-10-CM | POA: Diagnosis not present

## 2018-08-06 DIAGNOSIS — N189 Chronic kidney disease, unspecified: Secondary | ICD-10-CM | POA: Diagnosis not present

## 2018-08-06 DIAGNOSIS — E1151 Type 2 diabetes mellitus with diabetic peripheral angiopathy without gangrene: Secondary | ICD-10-CM | POA: Diagnosis not present

## 2018-08-07 ENCOUNTER — Other Ambulatory Visit: Payer: Self-pay | Admitting: Physician Assistant

## 2018-08-13 DIAGNOSIS — E1122 Type 2 diabetes mellitus with diabetic chronic kidney disease: Secondary | ICD-10-CM | POA: Diagnosis not present

## 2018-08-13 DIAGNOSIS — I130 Hypertensive heart and chronic kidney disease with heart failure and stage 1 through stage 4 chronic kidney disease, or unspecified chronic kidney disease: Secondary | ICD-10-CM | POA: Diagnosis not present

## 2018-08-13 DIAGNOSIS — N189 Chronic kidney disease, unspecified: Secondary | ICD-10-CM | POA: Diagnosis not present

## 2018-08-13 DIAGNOSIS — E1151 Type 2 diabetes mellitus with diabetic peripheral angiopathy without gangrene: Secondary | ICD-10-CM | POA: Diagnosis not present

## 2018-08-13 DIAGNOSIS — Z4781 Encounter for orthopedic aftercare following surgical amputation: Secondary | ICD-10-CM | POA: Diagnosis not present

## 2018-08-13 DIAGNOSIS — I509 Heart failure, unspecified: Secondary | ICD-10-CM | POA: Diagnosis not present

## 2018-08-18 ENCOUNTER — Encounter: Payer: Self-pay | Admitting: Endocrinology

## 2018-08-18 ENCOUNTER — Ambulatory Visit (INDEPENDENT_AMBULATORY_CARE_PROVIDER_SITE_OTHER): Payer: Medicare Other | Admitting: Endocrinology

## 2018-08-18 VITALS — BP 128/60 | HR 60 | Wt 238.0 lb

## 2018-08-18 DIAGNOSIS — I130 Hypertensive heart and chronic kidney disease with heart failure and stage 1 through stage 4 chronic kidney disease, or unspecified chronic kidney disease: Secondary | ICD-10-CM | POA: Diagnosis not present

## 2018-08-18 DIAGNOSIS — E1165 Type 2 diabetes mellitus with hyperglycemia: Secondary | ICD-10-CM

## 2018-08-18 DIAGNOSIS — Z4781 Encounter for orthopedic aftercare following surgical amputation: Secondary | ICD-10-CM | POA: Diagnosis not present

## 2018-08-18 DIAGNOSIS — E1122 Type 2 diabetes mellitus with diabetic chronic kidney disease: Secondary | ICD-10-CM | POA: Diagnosis not present

## 2018-08-18 DIAGNOSIS — I509 Heart failure, unspecified: Secondary | ICD-10-CM | POA: Diagnosis not present

## 2018-08-18 DIAGNOSIS — E039 Hypothyroidism, unspecified: Secondary | ICD-10-CM | POA: Diagnosis not present

## 2018-08-18 DIAGNOSIS — N189 Chronic kidney disease, unspecified: Secondary | ICD-10-CM | POA: Diagnosis not present

## 2018-08-18 DIAGNOSIS — N183 Chronic kidney disease, stage 3 unspecified: Secondary | ICD-10-CM

## 2018-08-18 DIAGNOSIS — E1151 Type 2 diabetes mellitus with diabetic peripheral angiopathy without gangrene: Secondary | ICD-10-CM | POA: Diagnosis not present

## 2018-08-18 DIAGNOSIS — Z794 Long term (current) use of insulin: Secondary | ICD-10-CM | POA: Diagnosis not present

## 2018-08-18 DIAGNOSIS — E782 Mixed hyperlipidemia: Secondary | ICD-10-CM

## 2018-08-18 DIAGNOSIS — I6523 Occlusion and stenosis of bilateral carotid arteries: Secondary | ICD-10-CM | POA: Diagnosis not present

## 2018-08-18 LAB — COMPREHENSIVE METABOLIC PANEL
ALT: 35 U/L (ref 0–53)
AST: 26 U/L (ref 0–37)
Albumin: 4.1 g/dL (ref 3.5–5.2)
Alkaline Phosphatase: 27 U/L — ABNORMAL LOW (ref 39–117)
BUN: 38 mg/dL — ABNORMAL HIGH (ref 6–23)
CO2: 25 mEq/L (ref 19–32)
Calcium: 9.7 mg/dL (ref 8.4–10.5)
Chloride: 106 mEq/L (ref 96–112)
Creatinine, Ser: 1.93 mg/dL — ABNORMAL HIGH (ref 0.40–1.50)
GFR: 36.27 mL/min — ABNORMAL LOW (ref >60.00)
Glucose, Bld: 135 mg/dL — ABNORMAL HIGH (ref 70–99)
Potassium: 4.4 mEq/L (ref 3.5–5.1)
Sodium: 139 mEq/L (ref 135–145)
Total Bilirubin: 0.3 mg/dL (ref 0.2–1.2)
Total Protein: 7.1 g/dL (ref 6.0–8.3)

## 2018-08-18 LAB — URINALYSIS, ROUTINE W REFLEX MICROSCOPIC
Bilirubin Urine: NEGATIVE
Ketones, ur: NEGATIVE
Nitrite: NEGATIVE
Specific Gravity, Urine: 1.02 (ref 1.000–1.030)
Total Protein, Urine: NEGATIVE
Urine Glucose: NEGATIVE
Urobilinogen, UA: 0.2 (ref 0.0–1.0)
pH: 5.5 (ref 5.0–8.0)

## 2018-08-18 LAB — LIPID PANEL
Cholesterol: 131 mg/dL (ref 0–200)
HDL: 30.3 mg/dL — ABNORMAL LOW (ref >39.00)
NonHDL: 100.29
Total CHOL/HDL Ratio: 4
Triglycerides: 219 mg/dL — ABNORMAL HIGH (ref 0.0–149.0)
VLDL: 43.8 mg/dL — ABNORMAL HIGH (ref 0.0–40.0)

## 2018-08-18 LAB — T4, FREE: Free T4: 1.44 ng/dL (ref 0.60–1.60)

## 2018-08-18 LAB — MICROALBUMIN / CREATININE URINE RATIO
Creatinine,U: 125.3 mg/dL
Microalb Creat Ratio: 2.2 mg/g (ref 0.0–30.0)
Microalb, Ur: 2.8 mg/dL — ABNORMAL HIGH (ref 0.0–1.9)

## 2018-08-18 LAB — LDL CHOLESTEROL, DIRECT: Direct LDL: 77 mg/dL

## 2018-08-18 LAB — TSH: TSH: 2.48 u[IU]/mL (ref 0.35–4.50)

## 2018-08-18 NOTE — Patient Instructions (Addendum)
Check blood sugars on waking up 5 days a week  Also check blood sugars about 2 hours after meals and do this after different meals by rotation  Recommended blood sugar levels on waking up are 90-130 and about 2 hours after meal is 130-180  Please bring your blood sugar monitor to each visit, thank you   

## 2018-08-18 NOTE — Progress Notes (Signed)
Patient ID: Steven Maldonado, male   DOB: August 23, 1941, 77 y.o.   MRN: [id number]          Reason for Appointment: Follow-up for Type 2 Diabetes  Referring PCP: Dena Billet   History of Present Illness:          Date of diagnosis of type 2 diabetes mellitus:  1999      Background history:   He had been treated with metformin and glipizide for several years Metformin was stopped about 4 years ago because of renal dysfunction Presumably because of poor control he was started on insulin around the year 2009 He had been mostly on Lantus which was subsequently changed to [facility] and he thinks that Lantus worked better Also has been on Humalog 3-4 times a day for some time  Recent history:   Most recent A1c is 11.6 in June 2019 and is pending from today  INSULIN regimen is:  82 Basaglar at bedtime, Humalog 5 units before meals 3 times a day  Non-insulin hypoglycemic drugs the patient is taking are: None  Current management, blood sugar patterns and problems identified:  On his initial visit he was told to reduce his Basaglar because of low normal blood sugars in the mornings  He was also asked to stop glipizide and taking any Humalog at bedtime  Also he was asked to start checking blood sugars after meals but he is monitoring only before eating  He has seen the dietitian last month and with this he is trying to improve his diet significantly especially cutting back on high fat and high carbohydrate intake  Although his blood sugars are not consistent at any given time they are generally trending lower and his pre-meal average blood sugar is down to 150 now  He has lost 11 pounds in the last 6 weeks or so  Currently using freestyle meter but not clear if he is using a different meter, his home record does not correlate with his downloaded information  Also his meter has the wrong time programmed  He does not adjust his mealtime dose based on what he is eating and not sure how his  postprandial readings are  Recently highest blood sugars overall are still before dinnertime  He has had only one episode of low blood sugar around 3 AM        Side effects from medications have been: None  Compliance with the medical regimen: Improving     Meal times are:  Breakfast is at 8 AM  Currently meals are being provided by nursing home and he is going to be discharged on Sunday              Exercise:  Currently none  Glucose monitoring:  done recently 3 times a day         Glucometer: FreeStyle meter       Blood Glucose readings by time of day and averages from meter download:   PRE-MEAL Fasting Lunch Dinner Bedtime Overall  Glucose range:  84-165  116-302  75-246    Mean/median:  130  155  175  150   POST-MEAL PC Breakfast PC Lunch PC Dinner  Glucose range:  ?   Mean/median:      Previous readings:  PREMEAL Breakfast Lunch Dinner Bedtime  Overall   Glucose range:  73-176  69-193  108-203  140-271   Median:     ?   Dietician visit, most recent: 9/19  Weight history:  Wt  Readings from Last 3 Encounters:  08/18/18 238 lb (108 kg)  07/03/18 249 lb (112.9 kg)  06/26/18 245 lb (111.1 kg)    Glycemic control:   Lab Results  Component Value Date   HGBA1C 11.6 (H) 04/17/2018   HGBA1C 8.1 (H) 11/28/2017   HGBA1C 11.2 (H) 10/13/2017   Lab Results  Component Value Date   MICROALBUR 2.8 (H) 08/18/2018   LDLCALC 93 10/13/2017   CREATININE 1.93 (H) 08/18/2018   Lab Results  Component Value Date   MICRALBCREAT 2.2 08/18/2018    Lab Results  Component Value Date   FRUCTOSAMINE 268 06/26/2018    Office Visit on 08/18/2018  Component Date Value Ref Range Status  . Free T4 08/18/2018 1.44  0.60 - 1.60 ng/dL Final   Comment: Specimens from patients who are undergoing biotin therapy and /or ingesting biotin supplements may contain high levels of biotin.  The higher biotin concentration in these specimens interferes with this Free T4 assay.  Specimens that  contain high levels  of biotin may cause false high results for this Free T4 assay.  Please interpret results in light of the total clinical presentation of the patient.    [name] Kitchen TSH 08/18/2018 2.48  0.35 - 4.50 uIU/mL Final  . Microalb, Ur 08/18/2018 2.8* 0.0 - 1.9 mg/dL Final  . Creatinine,U 08/18/2018 125.3  mg/dL Final  . Microalb Creat Ratio 08/18/2018 2.2  0.0 - 30.0 mg/g Final  . Color, Urine 08/18/2018 YELLOW  Yellow;Lt. Yellow Final  . APPearance 08/18/2018 Sl Cloudy* Clear Final  . Specific Gravity, Urine 08/18/2018 1.020  1.000 - 1.030 Final  . pH 08/18/2018 5.5  5.0 - 8.0 Final  . Total Protein, Urine 08/18/2018 NEGATIVE  Negative Final  . Urine Glucose 08/18/2018 NEGATIVE  Negative Final  . Ketones, ur 08/18/2018 NEGATIVE  Negative Final  . Bilirubin Urine 08/18/2018 NEGATIVE  Negative Final  . Hgb urine dipstick 08/18/2018 MODERATE* Negative Final  . Urobilinogen, UA 08/18/2018 0.2  0.0 - 1.0 Final  . Leukocytes, UA 08/18/2018 LARGE* Negative Final  . Nitrite 08/18/2018 NEGATIVE  Negative Final  . WBC, UA 08/18/2018 TNTC(>50/hpf)* 0-2/hpf Final  . RBC / HPF 08/18/2018 7-10/hpf* 0-2/hpf Final  . Mucus, UA 08/18/2018 Presence of* None Final  . Squamous Epithelial / LPF 08/18/2018 Few(5-10/hpf)* Rare(0-4/hpf) Final  . Bacteria, UA 08/18/2018 Rare(<10/hpf)* None Final  . Hyaline Casts, UA 08/18/2018 Presence of* None Final  . Cholesterol 08/18/2018 131  0 - 200 mg/dL Final   ATP III Classification       Desirable:  < 200 mg/dL               Borderline High:  200 - 239 mg/dL          High:  > = 240 mg/dL  . Triglycerides 08/18/2018 219.0* 0.0 - 149.0 mg/dL Final   Normal:  <150 mg/dLBorderline High:  150 - 199 mg/dL  . HDL 08/18/2018 30.30* >39.00 mg/dL Final  . VLDL 08/18/2018 43.8* 0.0 - 40.0 mg/dL Final  . Total CHOL/HDL Ratio 08/18/2018 4   Final                  Men          Women1/2 Average Risk     3.4          3.3Average Risk          5.0          4.42X Average Risk  9.6          7.13X Average Risk          15.0          11.0                      . NonHDL 08/18/2018 100.29   Final   NOTE:  Non-HDL goal should be 30 mg/dL higher than patient's LDL goal (i.e. LDL goal of < 70 mg/dL, would have non-HDL goal of < 100 mg/dL)  . Sodium 08/18/2018 139  135 - 145 mEq/L Final  . Potassium 08/18/2018 4.4  3.5 - 5.1 mEq/L Final  . Chloride 08/18/2018 106  96 - 112 mEq/L Final  . CO2 08/18/2018 25  19 - 32 mEq/L Final  . Glucose, Bld 08/18/2018 135* 70 - 99 mg/dL Final  . BUN 08/18/2018 38* 6 - 23 mg/dL Final  . Creatinine, Ser 08/18/2018 1.93* 0.40 - 1.50 mg/dL Final  . Total Bilirubin 08/18/2018 0.3  0.2 - 1.2 mg/dL Final  . Alkaline Phosphatase 08/18/2018 27* 39 - 117 U/L Final  . AST 08/18/2018 26  0 - 37 U/L Final  . ALT 08/18/2018 35  0 - 53 U/L Final  . Total Protein 08/18/2018 7.1  6.0 - 8.3 g/dL Final  . Albumin 08/18/2018 4.1  3.5 - 5.2 g/dL Final  . Calcium 08/18/2018 9.7  8.4 - 10.5 mg/dL Final  . GFR 08/18/2018 36.27* >60.00 mL/min Final  . Direct LDL 08/18/2018 77.0  mg/dL Final   Optimal:  <100 mg/dLNear or Above Optimal:  100-129 mg/dLBorderline High:  130-159 mg/dLHigh:  160-189 mg/dLVery High:  >190 mg/dL    Allergies as of 08/18/2018   No Known Allergies     Medication List        Accurate as of 08/18/18  8:36 PM. Always use your most recent med list.          amiodarone 200 MG tablet Commonly known as:  PACERONE Take 1 tablet (200 mg total) by mouth daily.   atorvastatin 80 MG tablet Commonly known as:  LIPITOR Take 80 mg by mouth at bedtime.   BASAGLAR KWIKPEN 100 UNIT/ML Sopn Inject 0.65 mLs (65 Units total) into the skin at bedtime.   CINNAMON PO Take 3,000 mg by mouth daily.   Fenofibric Acid 135 MG Cpdr Take 1 capsule by mouth.   FREESTYLE LITE Devi 1 each by Does not apply route 2 (two) times daily.   furosemide 20 MG tablet Commonly known as:  LASIX Take 1 tablet (20 mg total) by mouth daily.   gabapentin  300 MG capsule Commonly known as:  NEURONTIN Take 1 capsule (300 mg total) by mouth 3 (three) times daily.   glipiZIDE 10 MG 24 hr tablet Commonly known as:  GLUCOTROL XL TAKE 1 TABLET (10 MG TOTAL) BY MOUTH DAILY WITH BREAKFAST.   glucose blood test strip 1 each by Other route 4 (four) times daily -  before meals and at bedtime.   insulin lispro 100 UNIT/ML KiwkPen Commonly known as:  HUMALOG Inject 0.05 mLs (5 Units total) into the skin 3 (three) times daily.   Insulin Pen Needle 32G X 4 MM Misc USE 4 (FOUR) TIMES DAILY.   levothyroxine 175 MCG tablet Commonly known as:  SYNTHROID, LEVOTHROID Take 1 tablet (175 mcg total) by mouth daily.   metoprolol tartrate 50 MG tablet Commonly known as:  LOPRESSOR Take 75 mg by mouth 2 (two) times daily.  MYRBETRIQ 25 MG Tb24 tablet Generic drug:  mirabegron ER TAKE 1 TABLET BY MOUTH EVERY DAY   pentoxifylline 400 MG CR tablet Commonly known as:  TRENTAL Take 1 tablet (400 mg total) by mouth 3 (three) times daily with meals.   PRADAXA 150 MG Caps capsule Generic drug:  dabigatran Take 150 mg by mouth every 12 (twelve) hours.       Allergies: No Known Allergies  Past Medical History:  Diagnosis Date  . Chronic diastolic CHF (congestive heart failure) ([place])   . CKD (chronic kidney disease), stage III ([place])   . Constipation   . Coronary artery disease    a. Cath February 2012 in [place] [facility], occluded RCA with collaterals  . DM type 2 (diabetes mellitus, type 2) ([place])   . Essential hypertension   . Hyperlipidemia   . Neuropathy    feet  . Pacemaker    a. symptomatic brady after TAVR s/p MDT PPM by Dr. Curt Bears 12/04/17  . Persistent atrial fibrillation   . PONV (postoperative nausea and vomiting)    after valve surgery  . PVD (peripheral vascular disease) ([place])    a. s/p R popliteal artery stenosis tx with drug-coated balloon 05/2014, followed by Dr. Fletcher Anon.  . 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 Severe aortic stenosis    a. 12/02/17: s/p TAVR  . Skin cancer   . Sleep apnea with use of continuous positive airway pressure (CPAP)    04-11-11 AHI was 32.9 and titrated to 15 cm H20, DME is [facility]  . Subclinical hypothyroidism     Past Surgical History:  Procedure Laterality Date  . ABDOMINAL ANGIOGRAM N/A 06/08/2014   Procedure: ABDOMINAL ANGIOGRAM;  Surgeon: Wellington Hampshire, MD;  Location: [facility] CATH LAB;  Service: Cardiovascular;  Laterality: N/A;  . ABDOMINAL AORTOGRAM N/A 04/09/2018   Procedure: ABDOMINAL AORTOGRAM;  Surgeon: Conrad Lares, MD;  Location: [place] CV LAB;  Service: Cardiovascular;  Laterality: N/A;  . AMPUTATION Left 04/17/2018   Procedure: LEFT FOOT 3RD RAY AMPUTATION;  Surgeon: Newt Minion, MD;  Location: [place];  Service: Orthopedics;  Laterality: Left;  . AMPUTATION Left 05/28/2018   Procedure: LEFT AMPUTATION BELOW KNEE;  Surgeon: Wylene Simmer, MD;  Location: [place];  Service: Orthopedics;  Laterality: Left;  . APPENDECTOMY  1965  . 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 CARDIAC CATHETERIZATION  12/2010  . CARDIOVERSION  07/2011  . CARDIOVERSION N/A 04/18/2014   Procedure: CARDIOVERSION;  Surgeon: Dorothy Spark, MD;  Location: [place];  Service: Cardiovascular;  Laterality: N/A;  . CARDIOVERSION N/A 11/03/2015   Procedure: CARDIOVERSION;  Surgeon: Lelon Perla, MD;  Location: [facility] ENDOSCOPY;  Service: Cardiovascular;  Laterality: N/A;  . CARDIOVERSION N/A 05/08/2017   Procedure: CARDIOVERSION;  Surgeon: Dorothy Spark, MD;  Location: [facility] ENDOSCOPY;  Service: Cardiovascular;  Laterality: N/A;  . CARDIOVERSION N/A 07/28/2017   Procedure: CARDIOVERSION;  Surgeon: Dorothy Spark, MD;  Location: [place];  Service: Cardiovascular;  Laterality: N/A;  . LOWER EXTREMITY ANGIOGRAM N/A 06/08/2014   Procedure: LOWER EXTREMITY ANGIOGRAM;  Surgeon:  Wellington Hampshire, MD;  Location: [facility] CATH LAB;  Service: Cardiovascular;  Laterality: N/A;  . LOWER EXTREMITY ANGIOGRAPHY Left 04/09/2018   Procedure: Lower Extremity Angiography;  Surgeon: Conrad [place], MD;  Location: [place] CV LAB;  Service: Cardiovascular;  Laterality: Left;  .  PACEMAKER IMPLANT N/A 12/04/2017   Procedure: PACEMAKER IMPLANT;  Surgeon: Constance Haw, MD;  Location: [place] CV LAB;  Service: Cardiovascular;  Laterality: N/A;  . PERIPHERAL VASCULAR BALLOON ANGIOPLASTY Left 04/09/2018   Procedure: PERIPHERAL VASCULAR BALLOON ANGIOPLASTY;  Surgeon: Conrad Davidson, MD;  Location: [place] CV LAB;  Service: Cardiovascular;  Laterality: Left;  SFA  . POPLITEAL ARTERY ANGIOPLASTY Right 06/08/2014   Archie Endo 06/08/2014  . RIGHT/LEFT HEART CATH AND CORONARY ANGIOGRAPHY N/A 10/08/2017   Procedure: RIGHT/LEFT HEART CATH AND CORONARY ANGIOGRAPHY;  Surgeon: Burnell Blanks, MD;  Location: [place] CV LAB;  Service: Cardiovascular;  Laterality: N/A;  . SKIN CANCER EXCISION Bilateral    "have had them cut off back of neck X 2; off left upper arm; right wrist, near right shoulder blade" (06/08/2014)  . TEE WITHOUT CARDIOVERSION N/A 12/02/2017   Procedure: TRANSESOPHAGEAL ECHOCARDIOGRAM (TEE);  Surgeon: Burnell Blanks, MD;  Location: [place];  Service: Open Heart Surgery;  Laterality: N/A;  . TEMPORARY PACEMAKER N/A 12/04/2017   Procedure: TEMPORARY PACEMAKER;  Surgeon: Leonie Man, MD;  Location: [place] CV LAB;  Service: Cardiovascular;  Laterality: N/A;  . TRANSCATHETER AORTIC VALVE REPLACEMENT, TRANSFEMORAL N/A 12/02/2017   Procedure: TRANSCATHETER AORTIC VALVE REPLACEMENT, TRANSFEMORAL;  Surgeon: Burnell Blanks, MD;  Location: [place];  Service: Open Heart Surgery;  Laterality: N/A;  using Edwards Sapien 3 Transcatheter Heart Valve size 28mm    Family History  Problem Relation Age of Onset  . Diabetes Mother   . Heart attack Mother   .  Hypertension Mother   . Heart attack Father   . Heart failure Father   . Hypertension Father   . Diabetes Father   . Diabetes Sister   . Diabetes Brother   . Diabetes Other   . Diabetes Daughter        TYPE ll  . Heart Problems Daughter   . Hypertension Sister   . Hypertension Brother   . Stroke Brother     Social History:  reports that he quit smoking about 45 years ago. His smoking use included cigarettes. He has a 28.00 pack-year smoking history. He has never used smokeless tobacco. He reports that he does not drink alcohol or use drugs.   Review of Systems   Lipid history: He is taking 80 mg atorvastatin, high-dose with history of CAD    Lab Results  Component Value Date   CHOL 131 08/18/2018   HDL 30.30 (L) 08/18/2018   LDLCALC 93 10/13/2017   LDLDIRECT 77.0 08/18/2018   TRIG 219.0 (H) 08/18/2018   CHOLHDL 4 08/18/2018           Hypertension: Has been minimal with only antihypertensive being metoprolol, not on ACE inhibitor  BP Readings from Last 3 Encounters:  08/18/18 128/60  07/08/18 112/60  07/03/18 130/60    Most recent eye exam was in 12/18 ? 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 Free T4  08/18/2018 1.44  0.60 - 1.60 ng/dL Final   Comment: Specimens from patients who are undergoing biotin therapy and /or ingesting biotin supplements may contain high levels of biotin.  The higher biotin concentration in these specimens interferes with this Free T4 assay.  Specimens that contain high levels  of biotin may cause false high results for this Free T4 assay.  Please interpret results in light of the total clinical presentation of the patient.    [name] Kitchen TSH 08/18/2018 2.48  0.35 - 4.50 uIU/mL Final  . Microalb, Ur 08/18/2018 2.8* 0.0 - 1.9 mg/dL Final  . Creatinine,U 08/18/2018 125.3  mg/dL Final  . Microalb Creat Ratio 08/18/2018 2.2  0.0 - 30.0 mg/g Final  . Color, Urine 08/18/2018 YELLOW  Yellow;Lt. Yellow Final  . APPearance 08/18/2018 Sl Cloudy* Clear Final  . Specific Gravity, Urine 08/18/2018 1.020  1.000 - 1.030 Final  . pH 08/18/2018 5.5  5.0 - 8.0 Final  . Total Protein, Urine 08/18/2018 NEGATIVE  Negative Final  . Urine Glucose 08/18/2018 NEGATIVE  Negative Final  . Ketones, ur 08/18/2018 NEGATIVE  Negative Final  . Bilirubin Urine 08/18/2018 NEGATIVE  Negative Final  . Hgb urine dipstick 08/18/2018 MODERATE* Negative Final  . Urobilinogen, UA 08/18/2018 0.2  0.0 - 1.0 Final  . Leukocytes, UA 08/18/2018 LARGE* Negative Final  . Nitrite 08/18/2018 NEGATIVE  Negative Final  . WBC, UA 08/18/2018 TNTC(>50/hpf)* 0-2/hpf Final  . RBC / HPF 08/18/2018 7-10/hpf* 0-2/hpf Final  . Mucus, UA 08/18/2018 Presence of* None Final  . Squamous Epithelial / LPF 08/18/2018 Few(5-10/hpf)* Rare(0-4/hpf) Final  . Bacteria, UA 08/18/2018 Rare(<10/hpf)* None Final  . Hyaline Casts, UA 08/18/2018 Presence of* None Final  . Cholesterol 08/18/2018 131  0 - 200 mg/dL Final   ATP III Classification       Desirable:  < 200 mg/dL               Borderline High:  200 - 239 mg/dL          High:  > = 240 mg/dL  . Triglycerides 08/18/2018 219.0* 0.0 - 149.0 mg/dL Final   Normal:  <150 mg/dLBorderline High:   150 - 199 mg/dL  . HDL 08/18/2018 30.30* >39.00 mg/dL Final  . VLDL 08/18/2018 43.8* 0.0 - 40.0 mg/dL Final  . Total CHOL/HDL Ratio 08/18/2018 4   Final                  Men          Women1/2 Average Risk     3.4          3.3Average Risk          5.0          4.42X Average Risk          9.6          7.13X Average Risk          15.0          11.0                      . NonHDL 08/18/2018 100.29   Final   NOTE:  Non-HDL goal should be 30 mg/dL  higher than patient's LDL goal (i.e. LDL goal of < 70 mg/dL, would have non-HDL goal of < 100 mg/dL)  . Sodium 08/18/2018 139  135 - 145 mEq/L Final  . Potassium 08/18/2018 4.4  3.5 - 5.1 mEq/L Final  . Chloride 08/18/2018 106  96 - 112 mEq/L Final  . CO2 08/18/2018 25  19 - 32 mEq/L Final  . Glucose, Bld 08/18/2018 135* 70 - 99 mg/dL Final  . BUN 08/18/2018 38* 6 - 23 mg/dL Final  . Creatinine, Ser 08/18/2018 1.93* 0.40 - 1.50 mg/dL Final  . Total Bilirubin 08/18/2018 0.3  0.2 - 1.2 mg/dL Final  . Alkaline Phosphatase 08/18/2018 27* 39 - 117 U/L Final  . AST 08/18/2018 26  0 - 37 U/L Final  . ALT 08/18/2018 35  0 - 53 U/L Final  . Total Protein 08/18/2018 7.1  6.0 - 8.3 g/dL Final  . Albumin 08/18/2018 4.1  3.5 - 5.2 g/dL Final  . Calcium 08/18/2018 9.7  8.4 - 10.5 mg/dL Final  . GFR 08/18/2018 36.27* >60.00 mL/min Final  . Direct LDL 08/18/2018 77.0  mg/dL Final   Optimal:  <100 mg/dLNear or Above Optimal:  100-129 mg/dLBorderline High:  130-159 mg/dLHigh:  160-189 mg/dLVery High:  >190 mg/dL    Physical Examination:  BP 128/60   Pulse 60   Wt 238 lb (108 kg)   SpO2 98%   BMI 33.19 kg/m          ASSESSMENT:  Diabetes type 2, insulin requiring with moderate obesity  See history of present illness for detailed discussion of current diabetes management, blood sugar patterns and problems identified  Last A1c 11.6  He has changed his lifestyle significantly and his diet is better With this his weight is coming down significantly and his  blood sugars are overall improved also However did not have any postprandial readings He is asking what the freestyle libre sensor but is currently not taking more than 3 times a day to qualify for this   Complications of diabetes:  currently not having symptoms No microalbuminuria  History of coronary artery disease followed by cardiologist  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: Last TSH was significantly high and will need to repeat his labs since he was changed up to 175 mcg   .  Renal dysfunction: Needs follow-up as well as evaluation of urine microalbumin  PLAN:    He will start checking blood sugar readings about 2 hours after meals rather than before lunch and dinner  He can cut back on frequency of fasting blood sugar testing  Continue excellent diet  Discussed that if he is having consistently high readings over 180 after meals he will need to let us know  Continue follow-up with nutritionist as indicated  Before he finishes his Nancee Liter will have him switch to [place] U-200 which will provide more consistent 24-hour control and reduce variability and tendency to occasional hypoglycemia at night that he still has  Check A1c today  All questions about day-to-day management, monitoring and insulin were answered for him and his wife  He will check blood sugars 4 times a day to enable him to qualify for the freestyle libre sensor  Patient Instructions  Check blood sugars on waking up 5 days a week  Also check blood sugars about 2 hours after meals and do this after different meals by rotation  Recommended blood sugar levels on waking up are 90-130 and about 2 hours after meal is 130-180  Please bring  your blood sugar monitor to each visit, thank you     ADDENDUM:  Labs show TSH normal Labs forwarded to PCP regarding renal dysfunction  Counseling time on subjects discussed in assessment and plan sections is over 50% of today's 25 minute visit   Elayne Snare 08/18/2018, 8:36 PM   Note: This office note was prepared with Dragon voice recognition system technology. Any transcriptional errors that result from this process are unintentional.

## 2018-08-19 LAB — POCT GLYCOSYLATED HEMOGLOBIN (HGB A1C): Hemoglobin A1C: 6.1 % — AB (ref 4.0–5.6)

## 2018-08-19 NOTE — Progress Notes (Signed)
Please call to let patient know that A1c was 6.1

## 2018-08-19 NOTE — Addendum Note (Signed)
Addended by: Elta Guadeloupe on: 08/19/2018 08:13 AM   Modules accepted: Orders

## 2018-08-20 ENCOUNTER — Telehealth: Payer: Self-pay | Admitting: Endocrinology

## 2018-08-20 DIAGNOSIS — N189 Chronic kidney disease, unspecified: Secondary | ICD-10-CM | POA: Diagnosis not present

## 2018-08-20 DIAGNOSIS — Z4781 Encounter for orthopedic aftercare following surgical amputation: Secondary | ICD-10-CM | POA: Diagnosis not present

## 2018-08-20 DIAGNOSIS — I130 Hypertensive heart and chronic kidney disease with heart failure and stage 1 through stage 4 chronic kidney disease, or unspecified chronic kidney disease: Secondary | ICD-10-CM | POA: Diagnosis not present

## 2018-08-20 DIAGNOSIS — E1122 Type 2 diabetes mellitus with diabetic chronic kidney disease: Secondary | ICD-10-CM | POA: Diagnosis not present

## 2018-08-20 DIAGNOSIS — I509 Heart failure, unspecified: Secondary | ICD-10-CM | POA: Diagnosis not present

## 2018-08-20 DIAGNOSIS — E1151 Type 2 diabetes mellitus with diabetic peripheral angiopathy without gangrene: Secondary | ICD-10-CM | POA: Diagnosis not present

## 2018-08-20 NOTE — Telephone Encounter (Signed)
[name] called back because she got a voicemail about pt's lab results.

## 2018-08-21 NOTE — Telephone Encounter (Signed)
Patient was given results 

## 2018-08-24 ENCOUNTER — Telehealth: Payer: Self-pay

## 2018-08-24 DIAGNOSIS — N289 Disorder of kidney and ureter, unspecified: Secondary | ICD-10-CM

## 2018-08-24 DIAGNOSIS — R899 Unspecified abnormal finding in specimens from other organs, systems and tissues: Secondary | ICD-10-CM

## 2018-08-24 DIAGNOSIS — N183 Chronic kidney disease, stage 3 unspecified: Secondary | ICD-10-CM

## 2018-08-24 MED ORDER — MIRABEGRON ER 25 MG PO TB24: 25.0000 mg | ORAL_TABLET | Freq: Every day | ORAL | 3 refills | Status: DC

## 2018-08-25 DIAGNOSIS — I509 Heart failure, unspecified: Secondary | ICD-10-CM | POA: Diagnosis not present

## 2018-08-25 DIAGNOSIS — E1151 Type 2 diabetes mellitus with diabetic peripheral angiopathy without gangrene: Secondary | ICD-10-CM | POA: Diagnosis not present

## 2018-08-25 DIAGNOSIS — I130 Hypertensive heart and chronic kidney disease with heart failure and stage 1 through stage 4 chronic kidney disease, or unspecified chronic kidney disease: Secondary | ICD-10-CM | POA: Diagnosis not present

## 2018-08-25 DIAGNOSIS — E1122 Type 2 diabetes mellitus with diabetic chronic kidney disease: Secondary | ICD-10-CM | POA: Diagnosis not present

## 2018-08-25 DIAGNOSIS — Z4781 Encounter for orthopedic aftercare following surgical amputation: Secondary | ICD-10-CM | POA: Diagnosis not present

## 2018-08-25 DIAGNOSIS — N189 Chronic kidney disease, unspecified: Secondary | ICD-10-CM | POA: Diagnosis not present

## 2018-08-26 ENCOUNTER — Telehealth: Payer: Self-pay | Admitting: Nurse Practitioner

## 2018-08-26 NOTE — Telephone Encounter (Signed)
Approved.  

## 2018-08-26 NOTE — Telephone Encounter (Signed)
Patient wife called and is requesting a referral for a nephrologist because patient lab results came back and it shows his kidneys are worse . [name] stated Dr.Kumar requested PCP put in referral. Is it ok to put referral in?

## 2018-08-26 NOTE — Telephone Encounter (Signed)
Spoke with Mrs. Zenz that her husband still needs to comet to his appointment because we can pull the report on our computer for his cpap. She verbalized understanding. No questions or concerns at this time.

## 2018-08-26 NOTE — Telephone Encounter (Signed)
Patient's wife [name] (on [name]) calling stating patient has appointment with Hoyle Sauer on 09-03-18 @ 1:45pm. She wants to know if appointment is really needed because his new CPAP machine does not use a chip.

## 2018-08-27 DIAGNOSIS — N189 Chronic kidney disease, unspecified: Secondary | ICD-10-CM | POA: Diagnosis not present

## 2018-08-27 DIAGNOSIS — I130 Hypertensive heart and chronic kidney disease with heart failure and stage 1 through stage 4 chronic kidney disease, or unspecified chronic kidney disease: Secondary | ICD-10-CM | POA: Diagnosis not present

## 2018-08-27 DIAGNOSIS — I509 Heart failure, unspecified: Secondary | ICD-10-CM | POA: Diagnosis not present

## 2018-08-27 DIAGNOSIS — E1122 Type 2 diabetes mellitus with diabetic chronic kidney disease: Secondary | ICD-10-CM | POA: Diagnosis not present

## 2018-08-27 DIAGNOSIS — E1151 Type 2 diabetes mellitus with diabetic peripheral angiopathy without gangrene: Secondary | ICD-10-CM | POA: Diagnosis not present

## 2018-08-27 DIAGNOSIS — Z4781 Encounter for orthopedic aftercare following surgical amputation: Secondary | ICD-10-CM | POA: Diagnosis not present

## 2018-09-01 ENCOUNTER — Encounter: Payer: Self-pay | Admitting: Cardiology

## 2018-09-01 ENCOUNTER — Ambulatory Visit (INDEPENDENT_AMBULATORY_CARE_PROVIDER_SITE_OTHER): Payer: Medicare Other | Admitting: Cardiology

## 2018-09-01 ENCOUNTER — Ambulatory Visit: Payer: Self-pay | Admitting: *Deleted

## 2018-09-01 VITALS — BP 136/72 | HR 61 | Ht 71.0 in | Wt 243.0 lb

## 2018-09-01 DIAGNOSIS — I442 Atrioventricular block, complete: Secondary | ICD-10-CM | POA: Diagnosis not present

## 2018-09-01 DIAGNOSIS — G4733 Obstructive sleep apnea (adult) (pediatric): Secondary | ICD-10-CM | POA: Diagnosis not present

## 2018-09-01 DIAGNOSIS — I6523 Occlusion and stenosis of bilateral carotid arteries: Secondary | ICD-10-CM

## 2018-09-01 DIAGNOSIS — E785 Hyperlipidemia, unspecified: Secondary | ICD-10-CM

## 2018-09-01 DIAGNOSIS — R001 Bradycardia, unspecified: Secondary | ICD-10-CM

## 2018-09-01 DIAGNOSIS — I480 Paroxysmal atrial fibrillation: Secondary | ICD-10-CM

## 2018-09-01 DIAGNOSIS — E119 Type 2 diabetes mellitus without complications: Secondary | ICD-10-CM | POA: Insufficient documentation

## 2018-09-01 DIAGNOSIS — I2510 Atherosclerotic heart disease of native coronary artery without angina pectoris: Secondary | ICD-10-CM

## 2018-09-01 NOTE — Progress Notes (Signed)
Electrophysiology Office Note   Date:  09/01/2018   ID:  Steven, Maldonado [birth year], MRN [id number]  PCP:  [name], [name]  Cardiologist:  Angelena Form Primary Electrophysiologist:  Gurnoor Sloop Meredith Leeds, MD    No chief complaint on file.    History of Present Illness: Steven Maldonado is a 77 y.o. male who is being seen today for the evaluation of complete AV block at the request of Darlina Guys. Presenting today for electrophysiology evaluation. 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  Today, denies symptoms of palpitations, chest pain, shortness of breath, orthopnea, PND, lower extremity edema, claudication, dizziness, presyncope, syncope, bleeding, or neurologic sequela. The patient is tolerating medications without difficulties.  No chest pain or shortness of breath.  Device function is appropriate.  He has had no further episodes of atrial fibrillation.  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    Past Medical History:  Diagnosis Date  . Chronic diastolic CHF (congestive heart failure) ([place])   . CKD (chronic kidney disease), stage III ([place])   . Constipation   . Coronary artery disease    a. Cath February 2012 in [facility], occluded RCA with collaterals  . DM type 2 (diabetes mellitus, type 2) ([place])   . Essential hypertension   . Hyperlipidemia   . Neuropathy    feet  . Pacemaker    a. symptomatic brady after TAVR s/p MDT PPM by Dr. Curt Bears 12/04/17  . Persistent atrial fibrillation   . PONV (postoperative nausea and vomiting)    after valve surgery  . PVD (peripheral vascular disease) ([place])    a. s/p R popliteal artery stenosis tx with drug-coated balloon 05/2014, followed by Dr. Fletcher Anon.  . 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 Severe aortic stenosis    a. 12/02/17: s/p TAVR  . Skin cancer   . Sleep apnea with use of continuous positive airway pressure (CPAP)    04-11-11 AHI was 32.9 and titrated to 15 cm H20, DME is [facility]  . Subclinical hypothyroidism    Past Surgical History:  Procedure Laterality Date  . ABDOMINAL ANGIOGRAM N/A 06/08/2014   Procedure: ABDOMINAL ANGIOGRAM;  Surgeon: Wellington Hampshire, MD;  Location: [facility] CATH LAB;  Service: Cardiovascular;  Laterality: N/A;  . ABDOMINAL AORTOGRAM N/A 04/09/2018   Procedure: ABDOMINAL AORTOGRAM;  Surgeon: Conrad Maribel, MD;  Location: [place] CV LAB;  Service: Cardiovascular;  Laterality: N/A;  . AMPUTATION Left 04/17/2018   Procedure: LEFT FOOT 3RD RAY AMPUTATION;  Surgeon: Newt Minion, MD;  Location: [place];  Service: Orthopedics;  Laterality: Left;  . AMPUTATION Left 05/28/2018   Procedure: LEFT AMPUTATION BELOW KNEE;  Surgeon: Wylene Simmer, MD;  Location: [place];  Service: Orthopedics;  Laterality: Left;  . APPENDECTOMY  1965  . 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 CARDIAC CATHETERIZATION  12/2010  . CARDIOVERSION  07/2011  . CARDIOVERSION N/A 04/18/2014   Procedure: CARDIOVERSION;  Surgeon: Dorothy Spark, MD;  Location: [facility] ENDOSCOPY;  Service: Cardiovascular;  Laterality: N/A;  . CARDIOVERSION N/A 11/03/2015   Procedure: CARDIOVERSION;  Surgeon: Lelon Perla, MD;  Location: [facility] ENDOSCOPY;  Service: Cardiovascular;  Laterality: N/A;  . CARDIOVERSION N/A 05/08/2017  Procedure: CARDIOVERSION;  Surgeon: Dorothy Spark, MD;  Location: [facility] ENDOSCOPY;  Service: Cardiovascular;  Laterality: N/A;  . CARDIOVERSION N/A 07/28/2017   Procedure: CARDIOVERSION;  Surgeon: Dorothy Spark, MD;  Location: [place];  Service: Cardiovascular;  Laterality: N/A;  . LOWER EXTREMITY ANGIOGRAM N/A 06/08/2014   Procedure: LOWER EXTREMITY ANGIOGRAM;  Surgeon: Wellington Hampshire, MD;  Location: [place] CATH LAB;   Service: Cardiovascular;  Laterality: N/A;  . LOWER EXTREMITY ANGIOGRAPHY Left 04/09/2018   Procedure: Lower Extremity Angiography;  Surgeon: Conrad Naranjito, MD;  Location: [place] CV LAB;  Service: Cardiovascular;  Laterality: Left;  . PACEMAKER IMPLANT N/A 12/04/2017   Procedure: PACEMAKER IMPLANT;  Surgeon: Constance Haw, MD;  Location: [place] CV LAB;  Service: Cardiovascular;  Laterality: N/A;  . PERIPHERAL VASCULAR BALLOON ANGIOPLASTY Left 04/09/2018   Procedure: PERIPHERAL VASCULAR BALLOON ANGIOPLASTY;  Surgeon: Conrad Stephenson, MD;  Location: [place] CV LAB;  Service: Cardiovascular;  Laterality: Left;  SFA  . POPLITEAL ARTERY ANGIOPLASTY Right 06/08/2014   Archie Endo 06/08/2014  . RIGHT/LEFT HEART CATH AND CORONARY ANGIOGRAPHY N/A 10/08/2017   Procedure: RIGHT/LEFT HEART CATH AND CORONARY ANGIOGRAPHY;  Surgeon: Burnell Blanks, MD;  Location: [place] CV LAB;  Service: Cardiovascular;  Laterality: N/A;  . SKIN CANCER EXCISION Bilateral    "have had them cut off back of neck X 2; off left upper arm; right wrist, near right shoulder blade" (06/08/2014)  . TEE WITHOUT CARDIOVERSION N/A 12/02/2017   Procedure: TRANSESOPHAGEAL ECHOCARDIOGRAM (TEE);  Surgeon: Burnell Blanks, MD;  Location: [place];  Service: Open Heart Surgery;  Laterality: N/A;  . TEMPORARY PACEMAKER N/A 12/04/2017   Procedure: TEMPORARY PACEMAKER;  Surgeon: Leonie Man, MD;  Location: [place] CV LAB;  Service: Cardiovascular;  Laterality: N/A;  . TRANSCATHETER AORTIC VALVE REPLACEMENT, TRANSFEMORAL N/A 12/02/2017   Procedure: TRANSCATHETER AORTIC VALVE REPLACEMENT, TRANSFEMORAL;  Surgeon: Burnell Blanks, MD;  Location: [place];  Service: Open Heart Surgery;  Laterality: N/A;  using Edwards Sapien 3 Transcatheter Heart Valve size 64mm     Current Outpatient Medications  Medication Sig Dispense Refill  . amiodarone (PACERONE) 200 MG tablet Take 1 tablet (200 mg total) by mouth daily. 90  tablet 3  . atorvastatin (LIPITOR) 80 MG tablet Take 80 mg by mouth at bedtime.    . Blood Glucose Monitoring Suppl (FREESTYLE LITE) DEVI 1 each by Does not apply route 2 (two) times daily. 1 each 0  . dabigatran (PRADAXA) 150 MG CAPS capsule Take 150 mg by mouth every 12 (twelve) hours.     . furosemide (LASIX) 20 MG tablet Take 1 tablet (20 mg total) by mouth daily. 90 tablet 3  . glucose blood (FREESTYLE LITE) test strip 1 each by Other route 4 (four) times daily -  before meals and at bedtime. 450 each 3  . Insulin Glargine (BASAGLAR KWIKPEN) 100 UNIT/ML SOPN Inject 0.65 mLs (65 Units total) into the skin at bedtime. (Patient taking differently: Inject 55 Units into the skin at bedtime. ) 30 mL 0  . insulin lispro (HUMALOG) 100 UNIT/ML KiwkPen Inject 0.05 mLs (5 Units total) into the skin 3 (three) times daily. 15 mL 3  . Insulin Pen Needle (BD PEN NEEDLE NANO U/F) 32G X 4 MM MISC USE 4 (FOUR) TIMES DAILY. 1000 each 0  . levothyroxine (SYNTHROID, LEVOTHROID) 175 MCG tablet Take 1 tablet (175 mcg total) by mouth daily. 30 tablet 3  . metoprolol tartrate (LOPRESSOR) 50 MG tablet Take  75 mg by mouth 2 (two) times daily.    . mirabegron ER (MYRBETRIQ) 25 MG TB24 tablet Take 1 tablet (25 mg total) by mouth daily. 90 tablet 3  . Choline Fenofibrate (FENOFIBRIC ACID) 135 MG CPDR Take 1 capsule by mouth.    [name] Kitchen CINNAMON PO Take 3,000 mg by mouth daily.     [name] Kitchen gabapentin (NEURONTIN) 300 MG capsule Take 1 capsule (300 mg total) by mouth 3 (three) times daily. (Patient not taking: Reported on 09/01/2018) 90 capsule 5  . glipiZIDE (GLUCOTROL XL) 10 MG 24 hr tablet TAKE 1 TABLET (10 MG TOTAL) BY MOUTH DAILY WITH BREAKFAST. (Patient not taking: Reported on 09/01/2018) 90 tablet 1  . pentoxifylline (TRENTAL) 400 MG CR tablet Take 1 tablet (400 mg total) by mouth 3 (three) times daily with meals. (Patient not taking: Reported on 09/01/2018) 90 tablet 3   No current facility-administered medications for this  visit.     Allergies:   Patient has no known allergies.   Social History:  The patient  reports that he quit smoking about 45 years ago. His smoking use included cigarettes. He has a 28.00 pack-year smoking history. He has never used smokeless tobacco. He reports that he does not drink alcohol or use drugs.   Family History:  The patient's family history includes Diabetes in his brother, daughter, father, mother, other, and sister; Heart Problems in his daughter; Heart attack in his father and mother; Heart failure in his father; Hypertension in his brother, father, mother, and sister; Stroke in his brother.    ROS:  Please see the history of present illness.   Otherwise, review of systems is positive for none.   All other systems are reviewed and negative.   PHYSICAL EXAM: VS:  BP 136/72   Pulse 61   Ht 5\' 11"  (1.803 m)   Wt 243 lb (110.2 kg)   BMI 33.89 kg/m  , BMI Body mass index is 33.89 kg/m. GEN: Well nourished, well developed, in no acute distress  HEENT: normal  Neck: no JVD, carotid bruits, or masses Cardiac: RRR; no murmurs, rubs, or gallops,no edema  Respiratory:  clear to auscultation bilaterally, normal work of breathing GI: soft, nontender, nondistended, + BS MS: no deformity or atrophy  Skin: warm and dry, device site well healed Neuro:  Strength and sensation are intact Psych: euthymic mood, full affect  EKG:  EKG is ordered today. Personal review of the ekg ordered shows AV paced, rate 61  Personal review of the device interrogation today. Results in [place]: 11/28/2017: B Natriuretic Peptide 47.9 12/03/2017: Magnesium 1.9 12/11/2017: NT-Pro BNP 271 05/31/2018: Hemoglobin 9.6; Platelets 271 08/18/2018: ALT 35; BUN 38; Creatinine, Ser 1.93; Potassium 4.4; Sodium 139; TSH 2.48    Lipid Panel     Component Value Date/Time   CHOL 131 08/18/2018 1500   TRIG 219.0 (H) 08/18/2018 1500   HDL 30.30 (L) 08/18/2018 1500   CHOLHDL 4 08/18/2018 1500    VLDL 43.8 (H) 08/18/2018 1500   LDLCALC 93 10/13/2017 1224   LDLDIRECT 77.0 08/18/2018 1500     Wt Readings from Last 3 Encounters:  09/01/18 243 lb (110.2 kg)  08/18/18 238 lb (108 kg)  07/03/18 249 lb (112.9 kg)      Other studies Reviewed: Additional studies/ records that were reviewed today include: TTE 12/24/17  Review of the above records today demonstrates:  - Left ventricle: Inferobasal hypokinesis The cavity size was   normal. Wall thickness  was increased in a pattern of severe LVH.   Systolic function was normal. The estimated ejection fraction was   in the range of 55% to 60%. - Aortic valve: Post TAVR with no significant peri valvualr   regurgitation gradients have increased since 12/03/17. - Mitral valve: Severely calcified annulus. Moderately thickened,   moderately calcified leaflets . There was mild regurgitation. - Left atrium: The atrium was moderately dilated. - Atrial septum: No defect or patent foramen ovale was identified.   ASSESSMENT AND PLAN:  1.  Complete AV block: Status post Medtronic dual-chamber pacemaker implant in 12/04/2017.  Device functioning appropriately.  No changes at this time.  2. Paroxysmal atrial fibrillation: Early on amiodarone and Pradaxa.  Maintaining sinus rhythm.  No changes.  This patients CHA2DS2-VASc Score and unadjusted Ischemic Stroke Rate (% per year) is equal to 4.8 % stroke rate/year from a score of 4  Above score calculated as 1 point each if present [CHF, HTN, DM, Vascular=MI/PAD/Aortic Plaque, Age if 65-74, or Male] Above score calculated as 2 points each if present [Age > 75, or Stroke/TIA/TE]  3. Hypertension: Mildly elevated today but has been well controlled in the past.  No changes.  4. Coronary artery disease: No current chest pain.  Continue current management.  5. Hyperlipidemia: Continue statin  6. OSA: CPAP compliance encouraged    Current medicines are reviewed at length with the patient today.     The patient does not have concerns regarding his medicines.  The following changes were made today:  none  Labs/ tests ordered today include:  Orders Placed This Encounter  Procedures  . EKG 12-Lead     Disposition:   FU with Steven Maldonado 6 months  Signed, Steven Maldonado Meredith Leeds, MD  09/01/2018 2:30 PM     [address] [place] [address] [phone] (office) [phone] (fax)

## 2018-09-01 NOTE — Patient Instructions (Signed)
Medication Instructions:  Your physician recommends that you continue on your current medications as directed. Please refer to the Current Medication list given to you today.  If you need a refill on your cardiac medications before your next appointment, please call your pharmacy.   Lab work: None ordered  Testing/Procedures: None ordered   Follow-Up: Remote monitoring is used to monitor your Pacemaker of ICD from home. This monitoring reduces the number of office visits required to check your device to one time per year. It allows [place] to keep an eye on the functioning of your device to ensure it is working properly. You are scheduled for a device check from home on 09/28/2018. You may send your transmission at any time that day. If you have a wireless device, the transmission will be sent automatically. After your physician reviews your transmission, you will receive a postcard with your next transmission date.   At [facility], you and your health needs are our priority.  As part of our continuing mission to provide you with exceptional heart care, we have created designated Provider Care Teams.  These Care Teams include your primary Cardiologist (physician) and Advanced Practice Providers (APPs -  Physician Assistants and Nurse Practitioners) who all work together to provide you with the care you need, when you need it. You will need a follow up appointment in 6 months.  Please call our office 2 months in advance to schedule this appointment.  You may see Will Meredith Leeds, MD or one of the following Advanced Practice Providers on your designated Care Team:   Chanetta Marshall, NP . Tommye Standard, [name]  Thank you for choosing [facility]!!   Trinidad Curet, RN [phone]

## 2018-09-02 ENCOUNTER — Ambulatory Visit: Payer: Medicare Other | Attending: Student | Admitting: Physical Therapy

## 2018-09-02 ENCOUNTER — Encounter: Payer: Self-pay | Admitting: Nurse Practitioner

## 2018-09-02 ENCOUNTER — Encounter: Payer: Self-pay | Admitting: Physical Therapy

## 2018-09-02 ENCOUNTER — Other Ambulatory Visit: Payer: Self-pay | Admitting: *Deleted

## 2018-09-02 ENCOUNTER — Other Ambulatory Visit: Payer: Self-pay

## 2018-09-02 DIAGNOSIS — R293 Abnormal posture: Secondary | ICD-10-CM | POA: Insufficient documentation

## 2018-09-02 DIAGNOSIS — M6249 Contracture of muscle, multiple sites: Secondary | ICD-10-CM | POA: Insufficient documentation

## 2018-09-02 DIAGNOSIS — M6281 Muscle weakness (generalized): Secondary | ICD-10-CM

## 2018-09-02 DIAGNOSIS — R2681 Unsteadiness on feet: Secondary | ICD-10-CM | POA: Diagnosis not present

## 2018-09-02 DIAGNOSIS — R2689 Other abnormalities of gait and mobility: Secondary | ICD-10-CM

## 2018-09-02 DIAGNOSIS — Z9181 History of falling: Secondary | ICD-10-CM | POA: Insufficient documentation

## 2018-09-02 LAB — CUP PACEART INCLINIC DEVICE CHECK
Date Time Interrogation Session: 20191023085454
Implantable Lead Implant Date: 20190124
Implantable Lead Implant Date: 20190124
Implantable Lead Location: 753859
Implantable Lead Location: 753860
Implantable Lead Model: 5076
Implantable Lead Model: 5076
Implantable Pulse Generator Implant Date: 20190124

## 2018-09-02 NOTE — Progress Notes (Signed)
[facility]  PATIENT: Steven Maldonado DOB: 1941-07-06   REASON FOR VISIT: follow up for OSA, new machine  HISTORY FROM: Patient    HISTORY OF PRESENT ILLNESS:UPDATE 10/24/2019CM Steven Maldonado, 77 year old male returns for follow-up with obstructive sleep apnea here for CPAP compliance.  Patient states he is doing well with his CPAP however he has a significant leak and his wife is sleeping in the other room.  CPAP data dated 08/04/2018-09/02/2018 shows compliance greater than 4 hours at 80%.  Average usage 6 hours 28 minutes.  Set pressure 6 to 15 cm.  EPR level 2 leak 95th percentile at 102.9.  AHI 8.6 ESS 5.  He just recently got his prosthesis to the left lower leg.  He returns for reevaluation UPDATE 6/24/2019CM Steven Maldonado, 77 year old male returns for follow-up with history of obstructive sleep apnea here for CPAP compliance.  He has a new machine but no card.  His equipment company is not present.  Patient states he is doing well on CPAP and he has been compliant.  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  This  has not healed.  He is seated in a wheelchair.  ESS score is 4.  He returns for reevaluation    Machine is over 87 years old. 02-03-2018 . CD I have the pleasure of seeing Steven Maldonado today, a 77 year old Caucasian right-handed gentleman who has a long-standing history of obstructive sleep apnea and has always been compliant on CPAP.  Steven Maldonado, and had been admitted for a surgery he had an aortic valve replacement. He also now has a pacemaker.  He continues to be highly compliant with CPAP use the machine 28 out of the last 30 days, with 6 hours and 23 minutes average use, set pressure is 14 cmH2O with 3 cm expiratory pressure relief, residual AHI is only 2.2.  He does have  high air leaks on a FFM , he is due to receive a new machine. He snored very loud on nasal pillow. He likes to be refitted for  an interface. [employer] - he wants a cleaning machine .   Steven Maldonado is a 77 year old male with a history of obstructive sleep apnea on CPAP. He returns today for follow-up. He is currently using the CPAP 25 out of 30 days for compliance of 83%. He uses his machine greater than 4 hours 23 out of 30 days for compliance of 77%. On average he uses his machine 7 hours and 17 minutes. He is on a pressure of 14 cm water with EPR 3. His residual AHI is 1.8. He does have a significant leak in the 95th percentile at 49.3 L/m. He states that he is only able to change his straps every 6 months and they do get loose quickly. He also states that he does know that he pushes his mask off his face at night as his wife has witnessed him do this. He states that he is unaware that he does this. He does note that he does feel air leaking around the mask when he puts it on. He returns today for an evaluation  REVIEW OF SYSTEMS: Full 14 system review of systems performed and notable only for those listed, all others are neg:  Constitutional: neg  Cardiovascular: neg Ear/Nose/Throat: neg  Skin: neg Eyes: neg Respiratory: neg Gastroitestinal: neg  Hematology/Lymphatic: neg  Endocrine: neg Musculoskeletal:neg Allergy/Immunology: neg  Neurological: neg Psychiatric: neg Sleep : Obstructive sleep apnea with CPAP   ALLERGIES: No Known Allergies  HOME MEDICATIONS: Outpatient Medications Prior to Visit  Medication Sig Dispense Refill  . amiodarone (PACERONE) 200 MG tablet Take 1 tablet (200 mg total) by mouth daily. 90 tablet 3  . atorvastatin (LIPITOR) 80 MG tablet Take 80 mg by mouth at bedtime.    . Blood Glucose Monitoring Suppl (FREESTYLE LITE) DEVI 1 each by Does not apply route 2 (two) times daily. 1 each 0  . CINNAMON PO Take 3,000 mg by mouth daily.     . dabigatran (PRADAXA) 150 MG CAPS capsule Take 150 mg by mouth every 12 (twelve) hours.     . docusate sodium (COLACE) 100 MG capsule Take  100 mg by mouth 2 (two) times daily.    . furosemide (LASIX) 20 MG tablet Take 1 tablet (20 mg total) by mouth daily. 90 tablet 3  . glucose blood (FREESTYLE LITE) test strip 1 each by Other route 4 (four) times daily -  before meals and at bedtime. 450 each 3  . Insulin Glargine (BASAGLAR KWIKPEN) 100 UNIT/ML SOPN Inject 0.65 mLs (65 Units total) into the skin at bedtime. (Patient taking differently: Inject 55 Units into the skin at bedtime. ) 30 mL 0  . insulin lispro (HUMALOG) 100 UNIT/ML KiwkPen Inject 0.05 mLs (5 Units total) into the skin 3 (three) times daily. 15 mL 3  . Insulin Pen Needle (BD PEN NEEDLE NANO U/F) 32G X 4 MM MISC USE 4 (FOUR) TIMES DAILY. 1000 each 0  . levothyroxine (SYNTHROID, LEVOTHROID) 175 MCG tablet Take 1 tablet (175 mcg total) by mouth daily. 30 tablet 3  . metoprolol tartrate (LOPRESSOR) 50 MG tablet Take 75 mg by mouth 2 (two) times daily.    . mirabegron ER (MYRBETRIQ) 25 MG TB24 tablet Take 1 tablet (25 mg total) by mouth daily. 90 tablet 3  . Choline Fenofibrate (FENOFIBRIC ACID) 135 MG CPDR Take 1 capsule by mouth.    . gabapentin (NEURONTIN) 300 MG capsule Take 1 capsule (300 mg total) by mouth 3 (three) times daily. (Patient not taking: Reported on 09/03/2018) 90 capsule 5  . glipiZIDE (GLUCOTROL XL) 10 MG 24 hr tablet TAKE 1 TABLET (10 MG TOTAL) BY MOUTH DAILY WITH BREAKFAST. (Patient not taking: Reported on 09/03/2018) 90 tablet 1  . pentoxifylline (TRENTAL) 400 MG CR tablet Take 1 tablet (400 mg total) by mouth 3 (three) times daily with meals. (Patient not taking: Reported on 09/03/2018) 90 tablet 3   No facility-administered medications prior to visit.     PAST MEDICAL HISTORY: Past Medical History:  Diagnosis Date  . Chronic diastolic CHF (congestive heart failure) ([place])   . CKD (chronic kidney Maldonado), stage III ([place])   . Constipation   . Coronary artery Maldonado    a. Cath February 2012 in [place], occluded RCA with collaterals  . DM type 2  (diabetes mellitus, type 2) ([place])   . Essential hypertension   . Hyperlipidemia   . Neuropathy    feet  . Pacemaker    a. symptomatic brady after TAVR s/p MDT PPM by Dr. Curt Bears 12/04/17  . Persistent atrial fibrillation   . PONV (postoperative nausea and vomiting)    after valve surgery  . PVD (peripheral vascular Maldonado) ([place])    a. s/p R popliteal artery stenosis tx with drug-coated balloon 05/2014, followed by Dr. Fletcher Anon.  . 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 Severe aortic stenosis    a. 12/02/17: s/p TAVR  . Skin cancer   . Sleep apnea with use of continuous positive airway pressure (CPAP)    04-11-11 AHI was 32.9 and titrated to 15 cm H20, DME is [facility]  . Subclinical hypothyroidism     PAST SURGICAL HISTORY: Past Surgical History:  Procedure Laterality Date  . ABDOMINAL ANGIOGRAM N/A 06/08/2014   Procedure: ABDOMINAL ANGIOGRAM;  Surgeon: Wellington Hampshire, MD;  Location: [facility] CATH LAB;  Service: Cardiovascular;  Laterality: N/A;  . ABDOMINAL AORTOGRAM N/A 04/09/2018   Procedure: ABDOMINAL AORTOGRAM;  Surgeon: Conrad Parachute, MD;  Location: [place] CV LAB;  Service: Cardiovascular;  Laterality: N/A;  . AMPUTATION Left 04/17/2018   Procedure: LEFT FOOT 3RD RAY AMPUTATION;  Surgeon: Newt Minion, MD;  Location: [place];  Service: Orthopedics;  Laterality: Left;  . AMPUTATION Left 05/28/2018   Procedure: LEFT AMPUTATION BELOW KNEE;  Surgeon: Wylene Simmer, MD;  Location: [place];  Service: Orthopedics;  Laterality: Left;  . APPENDECTOMY  1965  . 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 CARDIAC CATHETERIZATION  12/2010  . CARDIOVERSION  07/2011  . CARDIOVERSION N/A 04/18/2014   Procedure: CARDIOVERSION;  Surgeon: Dorothy Spark, MD;  Location: [place];  Service: Cardiovascular;  Laterality: N/A;  . CARDIOVERSION N/A 11/03/2015   Procedure:  CARDIOVERSION;  Surgeon: Lelon Perla, MD;  Location: [facility] ENDOSCOPY;  Service: Cardiovascular;  Laterality: N/A;  . CARDIOVERSION N/A 05/08/2017   Procedure: CARDIOVERSION;  Surgeon: Dorothy Spark, MD;  Location: [facility] ENDOSCOPY;  Service: Cardiovascular;  Laterality: N/A;  . CARDIOVERSION N/A 07/28/2017   Procedure: CARDIOVERSION;  Surgeon: Dorothy Spark, MD;  Location: [place];  Service: Cardiovascular;  Laterality: N/A;  . LOWER EXTREMITY ANGIOGRAM N/A 06/08/2014   Procedure: LOWER EXTREMITY ANGIOGRAM;  Surgeon: Wellington Hampshire, MD;  Location: [facility] CATH LAB;  Service: Cardiovascular;  Laterality: N/A;  . LOWER EXTREMITY ANGIOGRAPHY Left 04/09/2018   Procedure: Lower Extremity Angiography;  Surgeon: Conrad Borden, MD;  Location: [place] CV LAB;  Service: Cardiovascular;  Laterality: Left;  . PACEMAKER IMPLANT N/A 12/04/2017   Procedure: PACEMAKER IMPLANT;  Surgeon: Constance Haw, MD;  Location: [place] CV LAB;  Service: Cardiovascular;  Laterality: N/A;  . PERIPHERAL VASCULAR BALLOON ANGIOPLASTY Left 04/09/2018   Procedure: PERIPHERAL VASCULAR BALLOON ANGIOPLASTY;  Surgeon: Conrad Stouchsburg, MD;  Location: [place] CV LAB;  Service: Cardiovascular;  Laterality: Left;  SFA  . POPLITEAL ARTERY ANGIOPLASTY Right 06/08/2014   Archie Endo 06/08/2014  . RIGHT/LEFT HEART CATH AND CORONARY ANGIOGRAPHY N/A 10/08/2017   Procedure: RIGHT/LEFT HEART CATH AND CORONARY ANGIOGRAPHY;  Surgeon: Burnell Blanks, MD;  Location: [place] CV LAB;  Service: Cardiovascular;  Laterality: N/A;  . SKIN CANCER EXCISION Bilateral    "have had them cut off back of neck X 2; off left upper arm; right wrist, near right shoulder blade" (06/08/2014)  . TEE WITHOUT CARDIOVERSION N/A 12/02/2017   Procedure: TRANSESOPHAGEAL ECHOCARDIOGRAM (TEE);  Surgeon: Burnell Blanks, MD;  Location: [place];  Service: Open Heart Surgery;  Laterality: N/A;  . TEMPORARY PACEMAKER N/A 12/04/2017   Procedure:  TEMPORARY PACEMAKER;  Surgeon: Leonie Man, MD;  Location: [place] CV LAB;  Service: Cardiovascular;  Laterality: N/A;  . TRANSCATHETER AORTIC VALVE REPLACEMENT, TRANSFEMORAL N/A 12/02/2017   Procedure: TRANSCATHETER AORTIC VALVE REPLACEMENT, TRANSFEMORAL;  Surgeon: [name]  D, MD;  Location: [place];  Service: Open Heart Surgery;  Laterality: N/A;  using Edwards Sapien 3 Transcatheter Heart Valve size 42mm    FAMILY HISTORY: Family History  Problem Relation Age of Onset  . Diabetes Mother   . Heart attack Mother   . Hypertension Mother   . Heart attack Father   . Heart failure Father   . Hypertension Father   . Diabetes Father   . Diabetes Sister   . Diabetes Brother   . Diabetes Other   . Diabetes Daughter        TYPE ll  . Heart Problems Daughter   . Hypertension Sister   . Hypertension Brother   . Stroke Brother     SOCIAL HISTORY: Social History   Socioeconomic History  . Marital status: Married    Spouse name: [name]  . Number of children: 1  . Years of education: 66  . Highest education level: Not on file  Occupational History  . Occupation: retired    Fish farm manager: [employer]  . Financial resource strain: Not on file  . Food insecurity:    Worry: Not on file    Inability: Not on file  . Transportation needs:    Medical: Not on file    Non-medical: Not on file  Tobacco Use  . Smoking status: Former Smoker    Packs/day: 2.00    Years: 14.00    Pack years: 28.00    Types: Cigarettes    Last attempt to quit: 11/11/1972    Years since quitting: 45.8  . Smokeless tobacco: Never Used  Substance and Sexual Activity  . Alcohol use: No    Comment: quit in 1984  . Drug use: No  . Sexual activity: Not Currently  Lifestyle  . Physical activity:    Days per week: 0 days    Minutes per session: 0 min  . Stress: Not at all  Relationships  . Social connections:    Talks on phone: Not on file    Gets together: Not on file     Attends religious service: Not on file    Active member of club or organization: Not on file    Attends meetings of clubs or organizations: Not on file    Relationship status: Not on file  . Intimate partner violence:    Fear of current or ex partner: Not on file    Emotionally abused: Not on file    Physically abused: Not on file    Forced sexual activity: Not on file  Other Topics Concern  . 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   Patient has one child and his wife has one child.   Patient is retired.   Patient has a high school education.   Patient is right-handed.   Patient drinks very little caffeine.     PHYSICAL EXAM  Vitals:   09/03/18 1341  BP: (!) 109/55  Pulse: (!) 59  Weight: 240 lb 6.4 oz (109 kg)  Height: 5\' 11"  (1.803 m)   Body mass index is 33.53 kg/m.  Generalized: Well developed, obese male in no acute distress  Head: normocephalic and atraumatic,. Oropharynx benign mallopatti 4 Neck: Supple, circumference 21 Lungs clear Musculoskeletal: No deformity  Skin no rash or edema Neurological examination   Mentation: Alert oriented to time, place, history taking. Attention span and concentration appropriate. Recent and remote memory  intact.  Follows all commands speech and language fluent.   Cranial nerve II-XII: .Pupils were equal round reactive to light extraocular movements were full, visual field were full on confrontational test. Facial sensation and strength were normal. hearing was intact to finger rubbing bilaterally. Uvula tongue midline. head turning and shoulder shrug were normal and symmetric.Tongue protrusion into cheek strength was normal. Motor: normal bulk and tone, full strength in the BUE, BLE, prosthesis left lower leg Sensory: normal and symmetric to light touch,  Coordination: finger-nose-finger, heel-to-shin bilaterally, no dysmetria Gait and Station: In wheelchair not  ambulated   DIAGNOSTIC DATA (LABS, IMAGING, TESTING) - I reviewed patient records, labs, notes, testing and imaging myself where available.  Lab Results  Component Value Date   WBC 6.7 05/31/2018   HGB 9.6 (L) 05/31/2018   HCT 30.3 (L) 05/31/2018   MCV 94.4 05/31/2018   PLT 271 05/31/2018      Component Value Date/Time   NA 139 08/18/2018 1500   NA 139 12/24/2017 1448   K 4.4 08/18/2018 1500   CL 106 08/18/2018 1500   CO2 25 08/18/2018 1500   GLUCOSE 135 (H) 08/18/2018 1500   BUN 38 (H) 08/18/2018 1500   BUN 20 12/24/2017 1448   CREATININE 1.93 (H) 08/18/2018 1500   CREATININE 1.75 (H) 10/13/2017 1224   CALCIUM 9.7 08/18/2018 1500   PROT 7.1 08/18/2018 1500   ALBUMIN 4.1 08/18/2018 1500   AST 26 08/18/2018 1500   ALT 35 08/18/2018 1500   ALKPHOS 27 (L) 08/18/2018 1500   BILITOT 0.3 08/18/2018 1500   GFRNONAA 37 (L) 05/30/2018 1429   GFRNONAA 38 (L) 10/13/2017 1224   GFRAA 42 (L) 05/30/2018 1429   GFRAA 43 (L) 10/13/2017 1224   Lab Results  Component Value Date   CHOL 131 08/18/2018   HDL 30.30 (L) 08/18/2018   LDLCALC 93 10/13/2017   LDLDIRECT 77.0 08/18/2018   TRIG 219.0 (H) 08/18/2018   CHOLHDL 4 08/18/2018   Lab Results  Component Value Date   HGBA1C 6.1 (A) 08/19/2018   No results found for: VITAMINB12 Lab Results  Component Value Date   TSH 2.48 08/18/2018      ASSESSMENT AND PLAN  77 y.o. year old male  has a past medical history of obstructive sleep apnea on CPAP with new machine.Data dated 08/04/2018-09/02/2018 shows compliance greater than 4 hours at 80%.  Average usage 6 hours 28 minutes.  Set pressure 6 to 15 cm.  EPR level 2 leak 95th percentile at 102.9.  AHI 8.6 ESS 5.    CPAP compliance 80% greater than 4 hours Significant leak needs mask refit Continue same settings Follow-up in 4 months Dennie Bible, [facility], [facility], APRN   [facility] [address] [place],  [address] [phone]

## 2018-09-02 NOTE — Patient Outreach (Addendum)
Steven Maldonado) Care Management   09/02/2018  Steven Maldonado 09/13/1941 [id number]    Steven Maldonado is an 77 y.o. male   Home visit   77 year old male with PMHx that includes but not limited to : Left foot osteomyelitis, hyperlipidemia, DM, CKD, HTN, PAF, OSA, transcatheter AVR, pacemaker  Subjective:  Patient states that he is doing good, since getting his  prothesis, and looking forward to  outpatient therapy on today .   Patient reports that his blood sugars are doing very good and that he has got a good handle on diabetes, with his wife helping him.   Patient discussed cardiology follow up visit on yesterday regarding pacemaker reports everything is going well .   Objective:  BP 124/78 (BP Location: Left Arm, Patient Position: Sitting, Cuff Size: Large)   Pulse 60   Ht 1.803 m (_0 )   Wt 240 lb (108.9 kg)   SpO2 98%   BMI 33.47 kg/m  Review of Systems  Constitutional: Negative.   HENT: Negative.   Eyes: Negative.   Respiratory: Negative.   Cardiovascular: Negative.   Gastrointestinal: Negative.   Genitourinary: Negative.   Musculoskeletal: Negative.   Skin: Negative.   Neurological: Negative.   Endo/Heme/Allergies: Negative.   Psychiatric/Behavioral: Negative.     Physical Exam  Constitutional: He is oriented to person, place, and time. He appears well-developed and well-nourished.  Cardiovascular: Normal rate and normal heart sounds.  Respiratory: Effort normal and breath sounds normal.  GI: Soft. Bowel sounds are normal.  Neurological: He is alert and oriented to person, place, and time.  Skin: Skin is warm and dry.     Psychiatric: He has a normal mood and affect. His behavior is normal. Judgment and thought content normal.    Encounter Medications:   Outpatient Encounter Medications as of 09/02/2018  Medication Sig  . amiodarone (PACERONE) 200 MG tablet Take 1 tablet (200 mg total) by mouth daily.  [name] Kitchen atorvastatin (LIPITOR) 80 MG  tablet Take 80 mg by mouth at bedtime.  . Blood Glucose Monitoring Suppl (FREESTYLE LITE) DEVI 1 each by Does not apply route 2 (two) times daily.  . Choline Fenofibrate (FENOFIBRIC ACID) 135 MG CPDR Take 1 capsule by mouth.  [name] Kitchen CINNAMON PO Take 3,000 mg by mouth daily.   . dabigatran (PRADAXA) 150 MG CAPS capsule Take 150 mg by mouth every 12 (twelve) hours.   . furosemide (LASIX) 20 MG tablet Take 1 tablet (20 mg total) by mouth daily.  [name] Kitchen gabapentin (NEURONTIN) 300 MG capsule Take 1 capsule (300 mg total) by mouth 3 (three) times daily. (Patient not taking: Reported on 09/01/2018)  . glipiZIDE (GLUCOTROL XL) 10 MG 24 hr tablet TAKE 1 TABLET (10 MG TOTAL) BY MOUTH DAILY WITH BREAKFAST. (Patient not taking: Reported on 09/01/2018)  . glucose blood (FREESTYLE LITE) test strip 1 each by Other route 4 (four) times daily -  before meals and at bedtime.  . Insulin Glargine (BASAGLAR KWIKPEN) 100 UNIT/ML SOPN Inject 0.65 mLs (65 Units total) into the skin at bedtime. (Patient taking differently: Inject 55 Units into the skin at bedtime. )  . insulin lispro (HUMALOG) 100 UNIT/ML KiwkPen Inject 0.05 mLs (5 Units total) into the skin 3 (three) times daily.  . Insulin Pen Needle (BD PEN NEEDLE NANO U/F) 32G X 4 MM MISC USE 4 (FOUR) TIMES DAILY.  [name] Kitchen levothyroxine (SYNTHROID, LEVOTHROID) 175 MCG tablet Take 1 tablet (175 mcg total) by mouth daily.  . metoprolol  tartrate (LOPRESSOR) 50 MG tablet Take 75 mg by mouth 2 (two) times daily.  . mirabegron ER (MYRBETRIQ) 25 MG TB24 tablet Take 1 tablet (25 mg total) by mouth daily.  . pentoxifylline (TRENTAL) 400 MG CR tablet Take 1 tablet (400 mg total) by mouth 3 (three) times daily with meals. (Patient not taking: Reported on 09/01/2018)   No facility-administered encounter medications on file as of 09/02/2018.     Functional Status:   In your present state of health, do you have any difficulty performing the following activities: 06/29/2018 05/28/2018  Hearing? N  -  Vision? N -  Difficulty concentrating or making decisions? N -  Walking or climbing stairs? Y -  Comment bka , unable to use stairs  -  Dressing or bathing? Y -  Comment wife assist  -  Doing errands, shopping? Y Y  Comment wife will assist  -  Conservation officer, nature and eating ? Y -  Using the Toilet? Y -  Comment wife assist  -  In the past six months, have you accidently leaked urine? Y -  Do you have problems with loss of bowel control? N -  Managing your Medications? N -  Managing your Finances? Y -  Comment wife assist  -  Housekeeping or managing your Housekeeping? Y -  Comment unable wife, does housekeeping  -  Some recent data might be hidden    Fall/Depression Screening:    Fall Risk  07/28/2018 06/29/2018 03/24/2018  Falls in the past year? Yes No No  Number falls in past yr: 1 - -  Injury with Fall? No - -  Risk for fall due to : - Impaired balance/gait;Impaired mobility -   PHQ 2/9 Scores 07/28/2018 06/29/2018 01/12/2018 12/15/2017 10/13/2017 05/01/2017 05/01/2016  PHQ - 2 Score 0 0 0 0 0 0 0  PHQ- 9 Score - - - - - 0 -    Assessment:  Routine home visit   Left BKA  Progressing well , post amputation, has prothesis, ambulating well in home, continuing exercise plan provided by home health and  initial outpatient amputation therapy session to begin on today. Patient has all necessary equipment at home and managing well with ADL's wife available to assist as needed.   Diabetes Checking blood sugars 4 times a day and keeping a record, hoping to get Freestyle libre approval at next visit with Dr.Kumar in December. Patient has follow up nutrition class in next month , wife attending also reports increased knowledge of meal preparation that has helped in managing blood sugars. Patient has also lost 11 pounds. A1c 6.9 on 08/19/18 down from 11.6 on 04/17/18  Patient checking blood sugar 4 times a day  in am and 2 hours after meals .  Today's blood sugar reading is 91, 7 day average blood  sugar readings is 136. No low blood sugar readings in the last month. Wife  Reports kidney being worse and has referral to nephrologist   Patient discussed follow up visit with neurology on tomorrow that follows him regarding sleep apnea , report consistency in wearing CPAP.  Patient and wife states he is managing well at home , attending all appointments wife provides transportation, beginning amputee therapy on today and anticipate several follow up visits.  Complex care management goals post discharge after  Left BKA have been met,patient and wife agreeable to continued telephonic support of Chronic condition of diabetes education with transition to health coach.     Plan  Will  plan transition to health coach for continued education and support of chronic medical condition of Diabetes.  Patient has been provided [facility] calendar /Diabetes book .  Will send PCP discipline closure letter.    Joylene Draft, RN, [facility] Management Coordinator  [phone]- Mobile [phone]- Toll Free Main Office

## 2018-09-03 ENCOUNTER — Ambulatory Visit (INDEPENDENT_AMBULATORY_CARE_PROVIDER_SITE_OTHER): Payer: Medicare Other | Admitting: Nurse Practitioner

## 2018-09-03 ENCOUNTER — Encounter: Payer: Self-pay | Admitting: Nurse Practitioner

## 2018-09-03 VITALS — BP 109/55 | HR 59 | Ht 71.0 in | Wt 240.4 lb

## 2018-09-03 DIAGNOSIS — Z9989 Dependence on other enabling machines and devices: Secondary | ICD-10-CM

## 2018-09-03 DIAGNOSIS — G4733 Obstructive sleep apnea (adult) (pediatric): Secondary | ICD-10-CM

## 2018-09-03 DIAGNOSIS — I6523 Occlusion and stenosis of bilateral carotid arteries: Secondary | ICD-10-CM | POA: Diagnosis not present

## 2018-09-03 NOTE — Patient Instructions (Signed)
CPAP compliance 80% greater than 4 hours Significant leak needs mask refit Continue same settings Follow-up in 4 months

## 2018-09-03 NOTE — Therapy (Signed)
[facility] [address] [place], [name], [address] Phone: [phone]   Fax:  [phone]  Physical Therapy Evaluation  Patient Details  Name: Steven Maldonado MRN: [id number] Date of Birth: 1941/09/29 Referring Provider (PT): Mechele Claude, [place]   Encounter Date: 09/02/2018  PT End of Session - 09/02/18 1947    Visit Number  1    Number of Visits  26    Date for PT Re-Evaluation  12/02/17    Authorization Type  Medicare & Federal BCBS    Authorization Time Period  Medicare guidelines, Fed BCBS waves co-pay.  75 visit limit PT, OT & ST combined with 1 used.    Authorization - Visit Number  1    Authorization - Number of Visits  75    PT Start Time  3785    PT Stop Time  1400    PT Time Calculation (min)  45 min    Equipment Utilized During Treatment  Gait belt    Activity Tolerance  Patient tolerated treatment well    Behavior During Therapy  WFL for tasks assessed/performed       Past Medical History:  Diagnosis Date  . Chronic diastolic CHF (congestive heart failure) ([place])   . CKD (chronic kidney disease), stage III ([place])   . Constipation   . Coronary artery disease    a. Cath February 2012 in [facility], occluded RCA with collaterals  . DM type 2 (diabetes mellitus, type 2) ([place])   . Essential hypertension   . Hyperlipidemia   . Neuropathy    feet  . Pacemaker    a. symptomatic brady after TAVR s/p MDT PPM by Dr. Curt Bears 12/04/17  . Persistent atrial fibrillation   . PONV (postoperative nausea and vomiting)    after valve surgery  . PVD (peripheral vascular disease) ([place])    a. s/p R popliteal artery stenosis tx with drug-coated balloon 05/2014, followed by Dr. Fletcher Anon.  . 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 Severe aortic stenosis    a. 12/02/17: s/p TAVR  . Skin cancer   .  Sleep apnea with use of continuous positive airway pressure (CPAP)    04-11-11 AHI was 32.9 and titrated to 15 cm H20, DME is [facility]  . Subclinical hypothyroidism     Past Surgical History:  Procedure Laterality Date  . ABDOMINAL ANGIOGRAM N/A 06/08/2014   Procedure: ABDOMINAL ANGIOGRAM;  Surgeon: Wellington Hampshire, MD;  Location: [facility] CATH LAB;  Service: Cardiovascular;  Laterality: N/A;  . ABDOMINAL AORTOGRAM N/A 04/09/2018   Procedure: ABDOMINAL AORTOGRAM;  Surgeon: Conrad [place], MD;  Location: [facility] CV LAB;  Service: Cardiovascular;  Laterality: N/A;  . AMPUTATION Left 04/17/2018   Procedure: LEFT FOOT 3RD RAY AMPUTATION;  Surgeon: Newt Minion, MD;  Location: [facility];  Service: Orthopedics;  Laterality: Left;  . AMPUTATION Left 05/28/2018   Procedure: LEFT AMPUTATION BELOW KNEE;  Surgeon: Wylene Simmer, MD;  Location: [facility];  Service: Orthopedics;  Laterality: Left;  . APPENDECTOMY  1965  . 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 CARDIAC CATHETERIZATION  12/2010  . CARDIOVERSION  07/2011  . CARDIOVERSION N/A 04/18/2014   Procedure: CARDIOVERSION;  Surgeon: Dorothy Spark, MD;  Location: [facility] ENDOSCOPY;  Service: Cardiovascular;  Laterality: N/A;  . CARDIOVERSION N/A 11/03/2015   Procedure: CARDIOVERSION;  Surgeon: Lelon Perla,  MD;  Location: [facility];  Service: Cardiovascular;  Laterality: N/A;  . CARDIOVERSION N/A 05/08/2017   Procedure: CARDIOVERSION;  Surgeon: Dorothy Spark, MD;  Location: [facility] ENDOSCOPY;  Service: Cardiovascular;  Laterality: N/A;  . CARDIOVERSION N/A 07/28/2017   Procedure: CARDIOVERSION;  Surgeon: Dorothy Spark, MD;  Location: [facility];  Service: Cardiovascular;  Laterality: N/A;  . LOWER EXTREMITY ANGIOGRAM N/A 06/08/2014   Procedure: LOWER EXTREMITY ANGIOGRAM;  Surgeon: Wellington Hampshire, MD;  Location: [facility] CATH LAB;  Service: Cardiovascular;  Laterality: N/A;  . LOWER EXTREMITY ANGIOGRAPHY Left 04/09/2018   Procedure: Lower Extremity  Angiography;  Surgeon: Conrad Woodburn, MD;  Location: [facility] CV LAB;  Service: Cardiovascular;  Laterality: Left;  . PACEMAKER IMPLANT N/A 12/04/2017   Procedure: PACEMAKER IMPLANT;  Surgeon: Constance Haw, MD;  Location: [facility] CV LAB;  Service: Cardiovascular;  Laterality: N/A;  . PERIPHERAL VASCULAR BALLOON ANGIOPLASTY Left 04/09/2018   Procedure: PERIPHERAL VASCULAR BALLOON ANGIOPLASTY;  Surgeon: Conrad , MD;  Location: [facility] CV LAB;  Service: Cardiovascular;  Laterality: Left;  SFA  . POPLITEAL ARTERY ANGIOPLASTY Right 06/08/2014   Archie Endo 06/08/2014  . RIGHT/LEFT HEART CATH AND CORONARY ANGIOGRAPHY N/A 10/08/2017   Procedure: RIGHT/LEFT HEART CATH AND CORONARY ANGIOGRAPHY;  Surgeon: Burnell Blanks, MD;  Location: [facility] CV LAB;  Service: Cardiovascular;  Laterality: N/A;  . SKIN CANCER EXCISION Bilateral    "have had them cut off back of neck X 2; off left upper arm; right wrist, near right shoulder blade" (06/08/2014)  . TEE WITHOUT CARDIOVERSION N/A 12/02/2017   Procedure: TRANSESOPHAGEAL ECHOCARDIOGRAM (TEE);  Surgeon: Burnell Blanks, MD;  Location: [facility];  Service: Open Heart Surgery;  Laterality: N/A;  . TEMPORARY PACEMAKER N/A 12/04/2017   Procedure: TEMPORARY PACEMAKER;  Surgeon: Leonie Man, MD;  Location: [facility] CV LAB;  Service: Cardiovascular;  Laterality: N/A;  . TRANSCATHETER AORTIC VALVE REPLACEMENT, TRANSFEMORAL N/A 12/02/2017   Procedure: TRANSCATHETER AORTIC VALVE REPLACEMENT, TRANSFEMORAL;  Surgeon: Burnell Blanks, MD;  Location: [facility];  Service: Open Heart Surgery;  Laterality: N/A;  using Edwards Sapien 3 Transcatheter Heart Valve size 63mm    There were no vitals filed for this visit.   Subjective Assessment - 09/02/18 1324    Subjective  This 77yo male was referred by Mechele Claude, PA for PT evaluation. 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 He received  his prosthesis 08/14/2018.    Patient is accompained by:  Family member   wife   Pertinent History  L TTA, CAD, PAF,  pacemaker, DM2, neuropathy, CKD, HTN,     Limitations  Lifting;Standing;Walking;House hold activities    Patient Stated Goals  To use prosthesis in community, travel (uses [place]), take of himself so wife does not have to do it. Housework.     Currently in Pain?  Yes    Pain Score  3     Pain Location  Leg   residual limb   Pain Orientation  Left;Anterior    Pain Descriptors / Indicators  Aching    Pain Type  Acute pain    Pain Onset  1 to 4 weeks ago    Pain Frequency  Intermittent    Aggravating Factors   wearing prosthesis    Pain Relieving Factors  sitting down         [facility] PT Assessment - 09/02/18 1315      Assessment   Medical Diagnosis  Left Transtibial Amputation    Referring Provider (PT)  Mechele Claude, PA    Onset Date/Surgical Date  08/14/18   prosthesis delivery   Hand Dominance  Right    Prior Therapy  Nursing home rehab      Precautions   Precautions  ICD/Pacemaker;Fall      Balance Screen   Has the patient fallen in the past 6 months  Yes    How many times?  1   No injuries   Has the patient had a decrease in activity level because of a fear of falling?   No    Is the patient reluctant to leave their home because of a fear of falling?   No      Home Environment   Living Environment  Private residence    Living Arrangements  Spouse/significant other    Type of [place] entrance   2nd entrance 2 steps left [place]  One level    [place] - 2 wheels;Crutches;Cane - single point;Electric scooter;Wheelchair - manual;Tub bench;Shower seat - built in      Prior Function   Level of Independence  Independent;Independent with household mobility without device;Independent with community mobility without device    Vocation  Retired    Leisure  travel,       Art therapist    Posture/Postural Control  Postural limitations    Postural Limitations  Rounded Shoulders;Forward head;Flexed trunk;Weight shift right    Posture Comments  morbid obesity      ROM / Strength   AROM / PROM / Strength  AROM;Strength      AROM   Overall AROM   Within functional limits for tasks performed    Overall AROM Comments  appears to have tightness in back, hip flexors, hamstrings & right gastroc.       Strength   Overall Strength  Deficits    Overall Strength Comments  Gross testing in sitting: BUEs 5/5, hips 3-4/5, knees ~4/5, right ankle DF 4/5      Transfers   Transfers  Sit to Stand;Stand to Sit    Sit to Stand  5: Supervision;With upper extremity assist;With armrests;From chair/3-in-1   needs touch on RW to stabilize   Stand to Sit  5: Supervision;With upper extremity assist;With armrests;To chair/3-in-1   RW for stability     Ambulation/Gait   Ambulation/Gait  Yes    Ambulation/Gait Assistance  5: Supervision    Ambulation/Gait Assistance Details  excessive UE weight bearing on RW and partial weight on prosthesis    Ambulation Distance (Feet)  100 Feet    Assistive device  Rolling walker;Prosthesis    Gait Pattern  Step-through pattern;Decreased step length - right;Decreased stance time - left;Decreased stride length;Decreased hip/knee flexion - left;Decreased weight shift to left;Left hip hike;Left circumduction;Antalgic;Lateral hip instability;Trunk flexed;Abducted - left    Ambulation Surface  Level;Indoor    Gait velocity  1.59 ft/sec      Standardized Balance Assessment   Standardized Balance Assessment  Berg Balance Test      Berg Balance Test   Sit to Stand  Able to stand using hands after several tries    Standing Unsupported  Able to stand 2 minutes with supervision    Sitting with Back Unsupported but Feet Supported on Floor or Stool  Able to sit safely and securely 2 minutes    Stand to Sit  Controls descent by using  hands    Transfers  Able to transfer  safely, definite need of hands    Standing Unsupported with Eyes Closed  Able to stand 3 seconds    Standing Ubsupported with Feet Together  Needs help to attain position and unable to hold for 15 seconds    From Standing, Reach Forward with Outstretched Arm  Reaches forward but needs supervision    From Standing Position, Pick up Object from Floor  Unable to try/needs assist to keep balance    From Standing Position, Turn to Look Behind Over each Shoulder  Needs supervision when turning    Turn 360 Degrees  Needs assistance while turning    Standing Unsupported, Alternately Place Feet on Step/Stool  Needs assistance to keep from falling or unable to try    Standing Unsupported, One Foot in Front  Loses balance while stepping or standing    Standing on One Leg  Unable to try or needs assist to prevent fall    Total Score  19      Prosthetics Assessment - 09/02/18 [place] with  Skin check;Residual limb care;Care of non-amputated limb;Prosthetic cleaning;Ply sock cleaning;Correct ply sock adjustment;Proper wear schedule/adjustment;Proper weight-bearing schedule/adjustment    Donning prosthesis   Max assist    Doffing prosthesis   Mod assist    Current prosthetic wear tolerance (days/week)   Pt reports some wear daily for 19 of 19 days since delivery    Current prosthetic wear tolerance (#hours/day)   He has progressed wear initial 2 hrs adding 30 minutes/day and now wearing 8-10 hrs over last 3 days.    PT recommended 4-5 hrs 2x/day with 2 hr break for now   Current prosthetic weight-bearing tolerance (hours/day)   Patient tolerated standing with partial weight on prosthesis in standing & gait for 5 minutes with no c/o pain.     Edema  pitting edema with 3-4 sec capillary refill    Residual limb condition   dry area with epithelial flaking on scar, cylinderical shape, minimal to no hair growth, redness over patella & distal tibia,                 Objective measurements completed on examination: See above findings.      [facility] Adult PT Treatment/Exercise - 09/02/18 1315      Prosthetics   Education Provided  Skin check;Residual limb care;Prosthetic cleaning;Correct ply sock adjustment;Proper Donning;Proper Doffing;Proper wear schedule/adjustment;Proper weight-bearing schedule/adjustment    Person(s) Educated  Patient;Spouse    Education Method  Explanation;Demonstration;Tactile cues;Verbal cues    Education Method  Verbalized understanding;Returned demonstration;Tactile cues required;Verbal cues required;Needs further instruction               PT Short Term Goals - 09/02/18 1900      PT SHORT TERM GOAL #1   Title  Patient donnes prosthesis with moderate assist for liner & minimal assist for prosthesis. (All STGs Target Date: 10/02/2018)    Time  1    Period  Months    Status  New    Target Date  10/02/18      PT SHORT TERM GOAL #2   Title  Patient tolerates prosthesis wear >12hrs daily without skin issues.     Time  1    Period  Months    Status  New    Target Date  10/02/18      PT SHORT TERM GOAL #3   Title  Patient performs standing balance with RW & prosthesis reaching 10", to floor and managing pants for toileting.     Time  1    Period  Months    Status  New    Target Date  10/02/18      PT SHORT TERM GOAL #4   Title  Patient ambulates 200' with RW & prosthesis with supervision.     Time  1    Period  Months    Status  New    Target Date  10/02/18      PT SHORT TERM GOAL #5   Title  Patient negotiates ramps & curbs with RW and stairs with 2 rails with minimal assist.     Time  1    Period  Months    Status  New    Target Date  10/02/18        PT Long Term Goals - 09/02/18 1800      PT LONG TERM GOAL #1   Title  Patient verbalizes & demonstrates proper prosthetic care including donning / doffing independently to enable safe use of prosthesis (All LTGs Target Date:  11/27/2018)    Time  3    Period  Months    Status  New    Target Date  11/27/18      PT LONG TERM GOAL #2   Title  Patient tolerates prosthesis wear daily for >90% of awake hours without skin or limb pain issues to enable function throughout his day.     Time  3    Period  Months    Status  New    Target Date  11/27/18      PT LONG TERM GOAL #3   Title  Berg Balance >36/56 to indicate lower fall risk & less dependency in standing ADLs.     Time  3    Period  Months    Status  New    Target Date  11/27/18      PT LONG TERM GOAL #4   Title  Patient ambulates 500' with RW (or rollator) & prosthesis outdoors on paved or grass up to 50' surfaces modified independent for community mobility.     Time  3    Period  Months    Status  New    Target Date  11/27/18      PT LONG TERM GOAL #5   Title  Patient negotiates ramps, curbs with walker & stairs 1 rail /cane modified independent for community access.     Time  3    Period  Months    Status  New    Target Date  11/27/18             Plan - 09/02/18 1900    Clinical Impression Statement  This 77yo male underwent a left Transtibial Amputation 05/28/2018 but has had limited function / mobility for last year with wounds/LE issues. He has deconditioning, weakness & impaired flexibility assoiciated with activity limitations. He received his first prosthesis 08/14/2018 and is dependent in proper care & use including maximal assist for donning. Patient has increased wear rapidly and has mild heat rash on limb. Patient needs skilled instruction to progress wear to daily for most of awake hours to enable function throughout his day. Berg Balance score of 19/56 indicates high fall risk & dependency in standing ADLs. His gait with prosthesis & rolling walker has gait deviations, velocity of 1.59 ft/sec and excessive UE weight bearing limits  function in home & community with high fall risk. patient would benefit from skilled PT to improve  function & safety.      History and Personal Factors relevant to plan of care:  L TTA, CAD, PAF,  pacemaker, DM2, neuropathy, CKD, HTN    Clinical Presentation  Evolving    Clinical Presentation due to:  high fall risk, prosthetic dependency with diabetes, neuropathy & CKD, cardiac issues.     Clinical Decision Making  Moderate    Rehab Potential  Good    PT Frequency  2x / week    PT Duration  Other (comment)   13 weeks (90 days)   PT Treatment/Interventions  ADLs/Self Care Home Management;[place] Repostioning;DME Instruction;Gait training;Stair training;Functional mobility training;Therapeutic activities;Therapeutic exercise;Balance training;Neuromuscular re-education;Patient/family education;Prosthetic Training;Manual techniques;Vestibular    PT Next Visit Plan  review prosthetic care, HEP at sink for standing balance & midline, prosthetic gait with RW including ramps, curbs and stairs with 2 rails.     Consulted and Agree with Plan of Care  Patient;Family member/caregiver    Family Member Consulted  wife       Patient will benefit from skilled therapeutic intervention in order to improve the following deficits and impairments:  Abnormal gait, Decreased activity tolerance, Decreased balance, Decreased endurance, Decreased knowledge of use of DME, Decreased mobility, Impaired flexibility, Decreased range of motion, Decreased strength, Decreased skin integrity, Dizziness, Postural dysfunction, Prosthetic Dependency  Visit Diagnosis: Abnormal posture  Muscle weakness (generalized)  Contracture of muscle, multiple sites  Unsteadiness on feet  Other abnormalities of gait and mobility  History of falling     Problem List Patient Active Problem List   Diagnosis Date Noted  . Type 2 diabetes mellitus without complication ([place]) 93/26/7124  . Bradycardia 09/01/2018  . Gangrene of left foot ([place]) 05/28/2018  . 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 Obstructive sleep apnea syndrome 05/04/2018  . Obstructive sleep apnea treated with continuous positive airway pressure (CPAP) 05/04/2018  . Type II diabetes mellitus, uncontrolled ([place]) 04/22/2018  . Atherosclerosis of native arteries of the extremities with gangrene ([place]) 04/09/2018  . Atherosclerosis of native artery of extremity ([place]) 04/09/2018  . Cellulitis in diabetic foot ([place]) 04/04/2018  . Paroxysmal atrial fibrillation ([place]) 12/05/2017  . Cardiac pacemaker in situ   . Symptomatic bradycardia   . Asystole ([place])   . 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 History of aortic valve replacement 12/02/2017  . Severe aortic stenosis   . Bilateral impacted cerumen 01/22/2016  . Essential hypertension 11/14/2015  . Peripheral vascular disease ([place]) 05/24/2014  . Chronic kidney disease 08/30/2013  . Hypothyroidism 08/30/2013  . 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 Diabetes mellitus type 2, uncomplicated ([place])   . Aortic valve stenosis 07/25/2011  . Hyperlipidemia 01/10/2011  . CAD (coronary artery disease) 01/10/2011  . Coronary arteriosclerosis 01/10/2011    Jamey Reas PT, DPT 09/03/2018, 10:15 AM  [facility] [address] [place], [name], [address] Phone: [phone]   Fax:  [phone]  Name: Steven Maldonado MRN: [id number] Date of Birth: 1940-11-29

## 2018-09-04 ENCOUNTER — Encounter: Payer: Self-pay | Admitting: *Deleted

## 2018-09-04 ENCOUNTER — Ambulatory Visit: Payer: Medicare Other

## 2018-09-04 DIAGNOSIS — M6281 Muscle weakness (generalized): Secondary | ICD-10-CM | POA: Diagnosis not present

## 2018-09-04 DIAGNOSIS — R293 Abnormal posture: Secondary | ICD-10-CM

## 2018-09-04 DIAGNOSIS — R2689 Other abnormalities of gait and mobility: Secondary | ICD-10-CM | POA: Diagnosis not present

## 2018-09-04 DIAGNOSIS — Z9181 History of falling: Secondary | ICD-10-CM

## 2018-09-04 DIAGNOSIS — M6249 Contracture of muscle, multiple sites: Secondary | ICD-10-CM

## 2018-09-04 DIAGNOSIS — R2681 Unsteadiness on feet: Secondary | ICD-10-CM | POA: Diagnosis not present

## 2018-09-04 NOTE — Therapy (Signed)
[facility] [address] [place], [name], [address] Phone: [phone]   Fax:  [phone]  Physical Therapy Treatment  Patient Details  Name: Steven Maldonado MRN: [id number] Date of Birth: 01-22-1941 Referring Provider (PT): Mechele Claude, [place]   Encounter Date: 09/04/2018  PT End of Session - 09/04/18 1354    Visit Number  2    Number of Visits  26    Date for PT Re-Evaluation  12/02/17    Authorization Type  Medicare & Federal BCBS    Authorization Time Period  Medicare guidelines, Fed BCBS waves co-pay.  75 visit limit PT, OT & ST combined with 1 used.    Authorization - Visit Number  1    Authorization - Number of Visits  75    PT Start Time  2951    PT Stop Time  1400    PT Time Calculation (min)  45 min    Equipment Utilized During Treatment  Gait belt    Activity Tolerance  Patient tolerated treatment well    Behavior During Therapy  WFL for tasks assessed/performed       Past Medical History:  Diagnosis Date  . Chronic diastolic CHF (congestive heart failure) ([place])   . CKD (chronic kidney disease), stage III ([place])   . Constipation   . Coronary artery disease    a. Cath February 2012 in [facility], occluded RCA with collaterals  . DM type 2 (diabetes mellitus, type 2) ([place])   . Essential hypertension   . Hyperlipidemia   . Neuropathy    feet  . Pacemaker    a. symptomatic brady after TAVR s/p MDT PPM by Dr. Curt Bears 12/04/17  . Persistent atrial fibrillation   . PONV (postoperative nausea and vomiting)    after valve surgery  . PVD (peripheral vascular disease) ([place])    a. s/p R popliteal artery stenosis tx with drug-coated balloon 05/2014, followed by Dr. Fletcher Anon.  . 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 Severe aortic stenosis    a. 12/02/17: s/p TAVR  . Skin cancer   .  Sleep apnea with use of continuous positive airway pressure (CPAP)    04-11-11 AHI was 32.9 and titrated to 15 cm H20, DME is [facility]  . Subclinical hypothyroidism     Past Surgical History:  Procedure Laterality Date  . ABDOMINAL ANGIOGRAM N/A 06/08/2014   Procedure: ABDOMINAL ANGIOGRAM;  Surgeon: Wellington Hampshire, MD;  Location: [facility] CATH LAB;  Service: Cardiovascular;  Laterality: N/A;  . ABDOMINAL AORTOGRAM N/A 04/09/2018   Procedure: ABDOMINAL AORTOGRAM;  Surgeon: Conrad Plains, MD;  Location: [facility] CV LAB;  Service: Cardiovascular;  Laterality: N/A;  . AMPUTATION Left 04/17/2018   Procedure: LEFT FOOT 3RD RAY AMPUTATION;  Surgeon: Newt Minion, MD;  Location: [facility];  Service: Orthopedics;  Laterality: Left;  . AMPUTATION Left 05/28/2018   Procedure: LEFT AMPUTATION BELOW KNEE;  Surgeon: Wylene Simmer, MD;  Location: [facility];  Service: Orthopedics;  Laterality: Left;  . APPENDECTOMY  1965  . 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 CARDIAC CATHETERIZATION  12/2010  . CARDIOVERSION  07/2011  . CARDIOVERSION N/A 04/18/2014   Procedure: CARDIOVERSION;  Surgeon: Dorothy Spark, MD;  Location: [facility] ENDOSCOPY;  Service: Cardiovascular;  Laterality: N/A;  . CARDIOVERSION N/A 11/03/2015   Procedure: CARDIOVERSION;  Surgeon: Lelon Perla,  MD;  Location: [facility];  Service: Cardiovascular;  Laterality: N/A;  . CARDIOVERSION N/A 05/08/2017   Procedure: CARDIOVERSION;  Surgeon: Dorothy Spark, MD;  Location: [facility] ENDOSCOPY;  Service: Cardiovascular;  Laterality: N/A;  . CARDIOVERSION N/A 07/28/2017   Procedure: CARDIOVERSION;  Surgeon: Dorothy Spark, MD;  Location: [facility];  Service: Cardiovascular;  Laterality: N/A;  . LOWER EXTREMITY ANGIOGRAM N/A 06/08/2014   Procedure: LOWER EXTREMITY ANGIOGRAM;  Surgeon: Wellington Hampshire, MD;  Location: [facility] CATH LAB;  Service: Cardiovascular;  Laterality: N/A;  . LOWER EXTREMITY ANGIOGRAPHY Left 04/09/2018   Procedure: Lower Extremity  Angiography;  Surgeon: Conrad Prichard, MD;  Location: [facility] CV LAB;  Service: Cardiovascular;  Laterality: Left;  . PACEMAKER IMPLANT N/A 12/04/2017   Procedure: PACEMAKER IMPLANT;  Surgeon: Constance Haw, MD;  Location: [facility] CV LAB;  Service: Cardiovascular;  Laterality: N/A;  . PERIPHERAL VASCULAR BALLOON ANGIOPLASTY Left 04/09/2018   Procedure: PERIPHERAL VASCULAR BALLOON ANGIOPLASTY;  Surgeon: Conrad [place], MD;  Location: [facility] CV LAB;  Service: Cardiovascular;  Laterality: Left;  SFA  . POPLITEAL ARTERY ANGIOPLASTY Right 06/08/2014   Archie Endo 06/08/2014  . RIGHT/LEFT HEART CATH AND CORONARY ANGIOGRAPHY N/A 10/08/2017   Procedure: RIGHT/LEFT HEART CATH AND CORONARY ANGIOGRAPHY;  Surgeon: Burnell Blanks, MD;  Location: [facility] CV LAB;  Service: Cardiovascular;  Laterality: N/A;  . SKIN CANCER EXCISION Bilateral    "have had them cut off back of neck X 2; off left upper arm; right wrist, near right shoulder blade" (06/08/2014)  . TEE WITHOUT CARDIOVERSION N/A 12/02/2017   Procedure: TRANSESOPHAGEAL ECHOCARDIOGRAM (TEE);  Surgeon: Burnell Blanks, MD;  Location: [facility];  Service: Open Heart Surgery;  Laterality: N/A;  . TEMPORARY PACEMAKER N/A 12/04/2017   Procedure: TEMPORARY PACEMAKER;  Surgeon: Leonie Man, MD;  Location: [facility] CV LAB;  Service: Cardiovascular;  Laterality: N/A;  . TRANSCATHETER AORTIC VALVE REPLACEMENT, TRANSFEMORAL N/A 12/02/2017   Procedure: TRANSCATHETER AORTIC VALVE REPLACEMENT, TRANSFEMORAL;  Surgeon: Burnell Blanks, MD;  Location: [facility];  Service: Open Heart Surgery;  Laterality: N/A;  using Edwards Sapien 3 Transcatheter Heart Valve size 10mm    There were no vitals filed for this visit.  Subjective Assessment - 09/04/18 1319    Subjective  No falls to report, no complaints, pt continues to wear prosthesis most awake hrs.     Patient is accompained by:  Family member    Pertinent History  L TTA, CAD, PAF,   pacemaker, DM2, neuropathy, CKD, HTN,     Limitations  Lifting;Standing;Walking;House hold activities    Patient Stated Goals  To use prosthesis in community, travel (uses [employer]ian[employer] Lei), take of himself so wife does not have to do it. Housework.     Currently in Pain?  Yes    Pain Score  3     Pain Location  Leg   residual limb   Pain Orientation  Left    Pain Descriptors / Indicators  Aching    Pain Type  Acute pain    Pain Onset  1 to 4 weeks ago    Pain Frequency  Intermittent    Aggravating Factors   weight bearing    Pain Relieving Factors  Off lifting weight       [facility] Adult PT Treatment/Exercise - 09/04/18 1355      Ambulation/Gait   Ambulation/Gait  Yes    Ambulation/Gait Assistance  5: Supervision    Ambulation/Gait Assistance Details  Pt continues to  present with heavy UE reliance on RW during ambulation, VC's for weight shift.     Ambulation Distance (Feet)  115 Feet    Assistive device  Rolling walker;Prosthesis    Gait Pattern  Step-through pattern;Decreased step length - right;Decreased stance time - left;Decreased stride length;Decreased hip/knee flexion - left;Decreased weight shift to left;Left hip hike;Left circumduction;Antalgic;Lateral hip instability;Trunk flexed;Abducted - left    Ambulation Surface  Level;Indoor      Neuro Re-ed    Neuro Re-ed Details   HEP at sink with SUE support, no LOB noted, one seated rest break required.         PT Education - 09/04/18 1354    Education Details  HEP at sink    Person(s) Educated  Patient;Spouse    Methods  Explanation;Demonstration;Verbal cues;Tactile cues;Handout    Comprehension  Verbalized understanding;Returned demonstration;Verbal cues required;Tactile cues required;Need further instruction       PT Short Term Goals - 09/02/18 1900      PT SHORT TERM GOAL #1   Title  Patient donnes prosthesis with moderate assist for liner & minimal assist for prosthesis. (All STGs Target Date: 10/02/2018)    Time  1    Period   Months    Status  New    Target Date  10/02/18      PT SHORT TERM GOAL #2   Title  Patient tolerates prosthesis wear >12hrs daily without skin issues.     Time  1    Period  Months    Status  New    Target Date  10/02/18      PT SHORT TERM GOAL #3   Title  Patient performs standing balance with RW & prosthesis reaching 10", to floor and managing pants for toileting.     Time  1    Period  Months    Status  New    Target Date  10/02/18      PT SHORT TERM GOAL #4   Title  Patient ambulates 200' with RW & prosthesis with supervision.     Time  1    Period  Months    Status  New    Target Date  10/02/18      PT SHORT TERM GOAL #5   Title  Patient negotiates ramps & curbs with RW and stairs with 2 rails with minimal assist.     Time  1    Period  Months    Status  New    Target Date  10/02/18        PT Long Term Goals - 09/02/18 1800      PT LONG TERM GOAL #1   Title  Patient verbalizes & demonstrates proper prosthetic care including donning / doffing independently to enable safe use of prosthesis (All LTGs Target Date: 11/27/2018)    Time  3    Period  Months    Status  New    Target Date  11/27/18      PT LONG TERM GOAL #2   Title  Patient tolerates prosthesis wear daily for >90% of awake hours without skin or limb pain issues to enable function throughout his day.     Time  3    Period  Months    Status  New    Target Date  11/27/18      PT LONG TERM GOAL #3   Title  Berg Balance >36/56 to indicate lower fall risk & less dependency in standing ADLs.  Time  3    Period  Months    Status  New    Target Date  11/27/18      PT LONG TERM GOAL #4   Title  Patient ambulates 500' with RW (or rollator) & prosthesis outdoors on paved or grass up to 50' surfaces modified independent for community mobility.     Time  3    Period  Months    Status  New    Target Date  11/27/18      PT LONG TERM GOAL #5   Title  Patient negotiates ramps, curbs with walker & stairs  1 rail /cane modified independent for community access.     Time  3    Period  Months    Status  New    Target Date  11/27/18        Plan - 09/04/18 1355    Clinical Impression Statement  Todays skilled session focused on Gait training with RW with emphasis on decreasing UE reliance and weight shift onto prosthesis, and initiating HEP at sink. Pt should benefit from continued PT sessions to progress towards goals.     Rehab Potential  Good    PT Frequency  2x / week    PT Duration  Other (comment)   13 weeks (90 days)   PT Treatment/Interventions  ADLs/Self Care Home Management;Canalith Repostioning;DME Instruction;Gait training;Stair training;Functional mobility training;Therapeutic activities;Therapeutic exercise;Balance training;Neuromuscular re-education;Patient/family education;Prosthetic Training;Manual techniques;Vestibular    PT Next Visit Plan  review prosthetic care, prosthetic gait with RW including ramps, curbs and stairs with 2 rails.     Consulted and Agree with Plan of Care  Patient;Family member/caregiver    Family Member Consulted  wife       Patient will benefit from skilled therapeutic intervention in order to improve the following deficits and impairments:  Abnormal gait, Decreased activity tolerance, Decreased balance, Decreased endurance, Decreased knowledge of use of DME, Decreased mobility, Impaired flexibility, Decreased range of motion, Decreased strength, Decreased skin integrity, Dizziness, Postural dysfunction, Prosthetic Dependency  Visit Diagnosis: Abnormal posture  Muscle weakness (generalized)  Contracture of muscle, multiple sites  Unsteadiness on feet  Other abnormalities of gait and mobility  History of falling     Problem List Patient Active Problem List   Diagnosis Date Noted  . Type 2 diabetes mellitus without complication ([place]) 37/85/8850  . Bradycardia 09/01/2018  . Gangrene of left foot ([place]) 05/28/2018  . 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 Obstructive sleep apnea syndrome 05/04/2018  . Obstructive sleep apnea treated with continuous positive airway pressure (CPAP) 05/04/2018  . Type II diabetes mellitus, uncontrolled ([place]) 04/22/2018  . Atherosclerosis of native arteries of the extremities with gangrene ([place]) 04/09/2018  . Atherosclerosis of native artery of extremity ([place]) 04/09/2018  . Cellulitis in diabetic foot ([place]) 04/04/2018  . Paroxysmal atrial fibrillation ([place]) 12/05/2017  . Cardiac pacemaker in situ   . Symptomatic bradycardia   . Asystole ([employer])   . 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 History of aortic valve replacement 12/02/2017  . Severe aortic stenosis   . Bilateral impacted cerumen 01/22/2016  . Essential hypertension 11/14/2015  . Peripheral vascular disease ([place]) 05/24/2014  . Chronic kidney disease 08/30/2013  . Hypothyroidism 08/30/2013  . 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 Diabetes mellitus type 2, uncomplicated ([place])   . Aortic valve stenosis 07/25/2011  . Hyperlipidemia 01/10/2011  . CAD (  coronary artery disease) 01/10/2011  . Coronary arteriosclerosis 01/10/2011   Etan Vasudevan, PTA  Dorotha Hirschi A Suvi Archuletta 09/04/2018, 4:09 PM  [facility] [address] [place], [name], [address] Phone: [phone]   Fax:  [phone]  Name: MENACHEM URBANEK MRN: [id number] Date of Birth: 19-Jun-1941

## 2018-09-04 NOTE — Patient Instructions (Signed)
Do each exercise 3  times per day Do each exercise 10 repetitions Hold each exercise for 5 seconds to feel your location  AT SINK FIND YOUR MIDLINE POSITION AND PLACE FEET EQUAL DISTANCE FROM THE MIDLINE.  USE TAPE ON FLOOR TO MARK THE MIDLINE POSITION. You also should try to feel with your limb pressure in socket.  You are trying to feel with limb what you used to feel with the bottom of your foot.  1. Side to Side Shift: Moving your hips only (not shoulders): move weight onto your left leg, HOLD/FEEL.  Move back to equal weight on each leg, HOLD/FEEL. Move weight onto your right leg, HOLD/FEEL. Move back to equal weight on each leg, HOLD/FEEL. Repeat. 2. Front to Back Shift: Moving your hips only (not shoulders): move your weight forward onto your toes, HOLD/FEEL. Move your weight back to equal Flat Foot on both legs, HOLD/FEEL. Move your weight back onto your heels, HOLD/FEEL. Move your weight back to equal on both legs, HOLD/FEEL. Repeat. 3. Moving Cones / Cups: With equal weight on each leg: Hold on with one hand the first time, then progress to no hand supports. Move cups from one side of sink to the other. Place cups ~2" out of your reach, progress to 10" beyond reach. 4. Overhead/Upward Reaching: alternated reaching up to top cabinets or ceiling if no cabinets present. Keep equal weight on each leg. Start with one hand support on counter while other hand reaches and progress to no hand support with reaching. 5.   Looking Over Shoulders: With equal weight on each leg: alternate turning to look          over your shoulders with one hand support on counter as needed. Shift weight to             side looking, pull hip then shoulder then head/eyes around to look behind you. Start       with one hand support & progress to no hand support. 

## 2018-09-08 ENCOUNTER — Ambulatory Visit: Payer: Medicare Other

## 2018-09-08 DIAGNOSIS — M6249 Contracture of muscle, multiple sites: Secondary | ICD-10-CM

## 2018-09-08 DIAGNOSIS — R293 Abnormal posture: Secondary | ICD-10-CM | POA: Diagnosis not present

## 2018-09-08 DIAGNOSIS — R2689 Other abnormalities of gait and mobility: Secondary | ICD-10-CM | POA: Diagnosis not present

## 2018-09-08 DIAGNOSIS — R2681 Unsteadiness on feet: Secondary | ICD-10-CM

## 2018-09-08 DIAGNOSIS — M6281 Muscle weakness (generalized): Secondary | ICD-10-CM

## 2018-09-08 DIAGNOSIS — Z9181 History of falling: Secondary | ICD-10-CM | POA: Diagnosis not present

## 2018-09-08 NOTE — Therapy (Signed)
[facility] [address] [place], [name], [address] Phone: [phone]   Fax:  [phone]  Physical Therapy Treatment  Patient Details  Name: Steven Maldonado MRN: [id number] Date of Birth: 1941-01-25 Referring Provider (PT): Mechele Claude, [place]   Encounter Date: 09/08/2018  PT End of Session - 09/08/18 1124    Visit Number  3    Number of Visits  26    Date for PT Re-Evaluation  12/02/17    Authorization Type  Medicare & Federal BCBS    Authorization Time Period  Medicare guidelines, Fed BCBS waves co-pay.  75 visit limit PT, OT & ST combined with 1 used.    Authorization - Visit Number  1    Authorization - Number of Visits  75    PT Start Time  1100    PT Stop Time  1145    PT Time Calculation (min)  45 min    Equipment Utilized During Treatment  Gait belt    Activity Tolerance  Patient tolerated treatment well    Behavior During Therapy  WFL for tasks assessed/performed       Past Medical History:  Diagnosis Date  . Chronic diastolic CHF (congestive heart failure) ([place])   . CKD (chronic kidney disease), stage III ([place])   . Constipation   . Coronary artery disease    a. Cath February 2012 in [facility], occluded RCA with collaterals  . DM type 2 (diabetes mellitus, type 2) ([place])   . Essential hypertension   . Hyperlipidemia   . Neuropathy    feet  . Pacemaker    a. symptomatic brady after TAVR s/p MDT PPM by Dr. Curt Bears 12/04/17  . Persistent atrial fibrillation   . PONV (postoperative nausea and vomiting)    after valve surgery  . PVD (peripheral vascular disease) ([place])    a. s/p R popliteal artery stenosis tx with drug-coated balloon 05/2014, followed by Dr. Fletcher Anon.  . 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 Severe aortic stenosis    a. 12/02/17: s/p TAVR  . Skin cancer   .  Sleep apnea with use of continuous positive airway pressure (CPAP)    04-11-11 AHI was 32.9 and titrated to 15 cm H20, DME is [facility]  . Subclinical hypothyroidism     Past Surgical History:  Procedure Laterality Date  . ABDOMINAL ANGIOGRAM N/A 06/08/2014   Procedure: ABDOMINAL ANGIOGRAM;  Surgeon: Wellington Hampshire, MD;  Location: [facility] CATH LAB;  Service: Cardiovascular;  Laterality: N/A;  . ABDOMINAL AORTOGRAM N/A 04/09/2018   Procedure: ABDOMINAL AORTOGRAM;  Surgeon: Conrad Gasburg, MD;  Location: [facility] CV LAB;  Service: Cardiovascular;  Laterality: N/A;  . AMPUTATION Left 04/17/2018   Procedure: LEFT FOOT 3RD RAY AMPUTATION;  Surgeon: Newt Minion, MD;  Location: [facility];  Service: Orthopedics;  Laterality: Left;  . AMPUTATION Left 05/28/2018   Procedure: LEFT AMPUTATION BELOW KNEE;  Surgeon: Wylene Simmer, MD;  Location: [facility];  Service: Orthopedics;  Laterality: Left;  . APPENDECTOMY  1965  . 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 CARDIAC CATHETERIZATION  12/2010  . CARDIOVERSION  07/2011  . CARDIOVERSION N/A 04/18/2014   Procedure: CARDIOVERSION;  Surgeon: Dorothy Spark, MD;  Location: [facility] ENDOSCOPY;  Service: Cardiovascular;  Laterality: N/A;  . CARDIOVERSION N/A 11/03/2015   Procedure: CARDIOVERSION;  Surgeon: Lelon Perla,  MD;  Location: [facility];  Service: Cardiovascular;  Laterality: N/A;  . CARDIOVERSION N/A 05/08/2017   Procedure: CARDIOVERSION;  Surgeon: Dorothy Spark, MD;  Location: [facility] ENDOSCOPY;  Service: Cardiovascular;  Laterality: N/A;  . CARDIOVERSION N/A 07/28/2017   Procedure: CARDIOVERSION;  Surgeon: Dorothy Spark, MD;  Location: [facility];  Service: Cardiovascular;  Laterality: N/A;  . LOWER EXTREMITY ANGIOGRAM N/A 06/08/2014   Procedure: LOWER EXTREMITY ANGIOGRAM;  Surgeon: Wellington Hampshire, MD;  Location: [facility] CATH LAB;  Service: Cardiovascular;  Laterality: N/A;  . LOWER EXTREMITY ANGIOGRAPHY Left 04/09/2018   Procedure: Lower Extremity  Angiography;  Surgeon: Conrad Eldridge, MD;  Location: [facility] CV LAB;  Service: Cardiovascular;  Laterality: Left;  . PACEMAKER IMPLANT N/A 12/04/2017   Procedure: PACEMAKER IMPLANT;  Surgeon: Constance Haw, MD;  Location: [facility] CV LAB;  Service: Cardiovascular;  Laterality: N/A;  . PERIPHERAL VASCULAR BALLOON ANGIOPLASTY Left 04/09/2018   Procedure: PERIPHERAL VASCULAR BALLOON ANGIOPLASTY;  Surgeon: Conrad White Rock, MD;  Location: [facility] CV LAB;  Service: Cardiovascular;  Laterality: Left;  SFA  . POPLITEAL ARTERY ANGIOPLASTY Right 06/08/2014   Archie Endo 06/08/2014  . RIGHT/LEFT HEART CATH AND CORONARY ANGIOGRAPHY N/A 10/08/2017   Procedure: RIGHT/LEFT HEART CATH AND CORONARY ANGIOGRAPHY;  Surgeon: Burnell Blanks, MD;  Location: [facility] CV LAB;  Service: Cardiovascular;  Laterality: N/A;  . SKIN CANCER EXCISION Bilateral    "have had them cut off back of neck X 2; off left upper arm; right wrist, near right shoulder blade" (06/08/2014)  . TEE WITHOUT CARDIOVERSION N/A 12/02/2017   Procedure: TRANSESOPHAGEAL ECHOCARDIOGRAM (TEE);  Surgeon: Burnell Blanks, MD;  Location: [facility];  Service: Open Heart Surgery;  Laterality: N/A;  . TEMPORARY PACEMAKER N/A 12/04/2017   Procedure: TEMPORARY PACEMAKER;  Surgeon: Leonie Man, MD;  Location: [facility] CV LAB;  Service: Cardiovascular;  Laterality: N/A;  . TRANSCATHETER AORTIC VALVE REPLACEMENT, TRANSFEMORAL N/A 12/02/2017   Procedure: TRANSCATHETER AORTIC VALVE REPLACEMENT, TRANSFEMORAL;  Surgeon: Burnell Blanks, MD;  Location: [facility];  Service: Open Heart Surgery;  Laterality: N/A;  using Edwards Sapien 3 Transcatheter Heart Valve size 16mm    There were no vitals filed for this visit.  Subjective Assessment - 09/08/18 1104    Subjective  No falls to report. HEP is going well, pt requires 3 seated rest breaks during HEP. Pt reports having issues with sock wear.     Pertinent History  L TTA, CAD, PAF,   pacemaker, DM2, neuropathy, CKD, HTN,     Limitations  Lifting;Standing;Walking;House hold activities    Patient Stated Goals  To use prosthesis in community, travel (uses [employer]), take of himself so wife does not have to do it. Housework.     Currently in Pain?  Yes    Pain Score  2     Pain Location  Leg   residual limb   Pain Orientation  Left    Pain Descriptors / Indicators  Pressure    Pain Type  Acute pain    Pain Onset  In the past 7 days    Aggravating Factors   Weightshift onto LLE    Pain Relieving Factors  Off ligting weight         [facility] Adult PT Treatment/Exercise - 09/08/18 1124      Ambulation/Gait   Ambulation/Gait  Yes    Stairs  Yes    Stairs Assistance  4: Min guard    Stair Management Technique  Two  rails;One rail Left;Step to pattern;Sideways;Forwards    Number of Stairs  4    Height of Stairs  6    Ramp  4: Min assist;5: Supervision   Min guard   Ramp Details (indicate cue type and reason)  VC's for proper weightshift and posture.     Curb  4: Min assist;5: Supervision   Min guard   Curb Details (indicate cue type and reason)  VC's for technique with RW and sequence.       Prosthetics   Prosthetic Care Comments   Therapist educated pt on skin checks often, redness due to weight bearing. Suggested pt call [employer] for padding added to prosthesis to reduce skin irritation/pain with weight bearing. Pt continues to have issues when donning prosthesis, educated pt on practicing at home, placing mirror under residual limb to see where the liner is hitting limb for proper placement, alignment of prostheisis when applying to get proper connection.      Residual limb condition   Some redness lat/ant of knee/below from pressure with weight shift.      Education Provided  Skin check;Proper Donning;Proper Doffing    Person(s) Educated  Patient;Spouse    Education Method  Explanation;Demonstration;Tactile cues;Verbal cues    Education Method  Verbalized  understanding;Returned demonstration;Needs further instruction;Verbal cues required;Tactile cues required    Donning Prosthesis  Minimal assist;Moderate assist          PT Short Term Goals - 09/02/18 1900      PT SHORT TERM GOAL #1   Title  Patient donnes prosthesis with moderate assist for liner & minimal assist for prosthesis. (All STGs Target Date: 10/02/2018)    Time  1    Period  Months    Status  New    Target Date  10/02/18      PT SHORT TERM GOAL #2   Title  Patient tolerates prosthesis wear >12hrs daily without skin issues.     Time  1    Period  Months    Status  New    Target Date  10/02/18      PT SHORT TERM GOAL #3   Title  Patient performs standing balance with RW & prosthesis reaching 10", to floor and managing pants for toileting.     Time  1    Period  Months    Status  New    Target Date  10/02/18      PT SHORT TERM GOAL #4   Title  Patient ambulates 200' with RW & prosthesis with supervision.     Time  1    Period  Months    Status  New    Target Date  10/02/18      PT SHORT TERM GOAL #5   Title  Patient negotiates ramps & curbs with RW and stairs with 2 rails with minimal assist.     Time  1    Period  Months    Status  New    Target Date  10/02/18        PT Long Term Goals - 09/02/18 1800      PT LONG TERM GOAL #1   Title  Patient verbalizes & demonstrates proper prosthetic care including donning / doffing independently to enable safe use of prosthesis (All LTGs Target Date: 11/27/2018)    Time  3    Period  Months    Status  New    Target Date  11/27/18      PT  LONG TERM GOAL #2   Title  Patient tolerates prosthesis wear daily for >90% of awake hours without skin or limb pain issues to enable function throughout his day.     Time  3    Period  Months    Status  New    Target Date  11/27/18      PT LONG TERM GOAL #3   Title  Berg Balance >36/56 to indicate lower fall risk & less dependency in standing ADLs.     Time  3    Period   Months    Status  New    Target Date  11/27/18      PT LONG TERM GOAL #4   Title  Patient ambulates 500' with RW (or rollator) & prosthesis outdoors on paved or grass up to 50' surfaces modified independent for community mobility.     Time  3    Period  Months    Status  New    Target Date  11/27/18      PT LONG TERM GOAL #5   Title  Patient negotiates ramps, curbs with walker & stairs 1 rail /cane modified independent for community access.     Time  3    Period  Months    Status  New    Target Date  11/27/18            Plan - 09/08/18 1246    Clinical Impression Statement  Todays skilled session focused on negotiating ramp and curbs with RW/prosthesis, stairs with 2 rails fwds/1 left rail sideways with prosthesis, and prosthetic care: VC and demo of donning prosthesis and suggestion to call [employer] for added padding in prosthesis to avoid pain with weightbearing. Pt is making steady progress toward goals and should benefit from continued PT to progress towards unmet goals.     Rehab Potential  Good    PT Frequency  2x / week    PT Duration  Other (comment)   13 weeks (90 days)   PT Treatment/Interventions  ADLs/Self Care Home Management;[employer] Repostioning;DME Instruction;Gait training;Stair training;Functional mobility training;Therapeutic activities;Therapeutic exercise;Balance training;Neuromuscular re-education;Patient/family education;Prosthetic Training;Manual techniques;Vestibular    PT Next Visit Plan  review prosthetic care, prosthetic gait with RW including ramps, curbs and stairs with 2 rails, high level balance.     Consulted and Agree with Plan of Care  Patient;Family member/caregiver    Family Member Consulted  wife       Patient will benefit from skilled therapeutic intervention in order to improve the following deficits and impairments:  Abnormal gait, Decreased activity tolerance, Decreased balance, Decreased endurance, Decreased knowledge of use of DME,  Decreased mobility, Impaired flexibility, Decreased range of motion, Decreased strength, Decreased skin integrity, Dizziness, Postural dysfunction, Prosthetic Dependency  Visit Diagnosis: Abnormal posture  Muscle weakness (generalized)  Contracture of muscle, multiple sites  Unsteadiness on feet  Other abnormalities of gait and mobility  History of falling     Problem List Patient Active Problem List   Diagnosis Date Noted  . Type 2 diabetes mellitus without complication ([place]) 16/60/6301  . Bradycardia 09/01/2018  . Gangrene of left foot ([place]) 05/28/2018  . 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 Obstructive sleep apnea syndrome 05/04/2018  . Obstructive sleep apnea treated with continuous positive airway pressure (CPAP) 05/04/2018  . Type II diabetes mellitus, uncontrolled ([place]) 04/22/2018  . Atherosclerosis of native arteries of the extremities with gangrene ([place]) 04/09/2018  . Atherosclerosis of  native artery of extremity ([place]) 04/09/2018  . Cellulitis in diabetic foot ([place]) 04/04/2018  . Paroxysmal atrial fibrillation ([place]) 12/05/2017  . Cardiac pacemaker in situ   . Symptomatic bradycardia   . Asystole ([place])   . 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 History of aortic valve replacement 12/02/2017  . Severe aortic stenosis   . Bilateral impacted cerumen 01/22/2016  . Essential hypertension 11/14/2015  . Peripheral vascular disease ([place]) 05/24/2014  . Chronic kidney disease 08/30/2013  . Hypothyroidism 08/30/2013  . 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 Diabetes mellitus type 2, uncomplicated ([place])   . Aortic valve stenosis 07/25/2011  . Hyperlipidemia 01/10/2011  . CAD (coronary artery disease) 01/10/2011  . Coronary arteriosclerosis 01/10/2011   Chassity Felts, PTA  Chassity A Felts 09/08/2018, 12:50 PM  [facility] [address] [place], [name], [address] Phone: [phone]   Fax:  [phone]  Name: Steven Maldonado MRN: [id number] Date of Birth: 02/19/1941

## 2018-09-11 ENCOUNTER — Ambulatory Visit: Payer: Medicare Other | Attending: Student

## 2018-09-11 DIAGNOSIS — R293 Abnormal posture: Secondary | ICD-10-CM | POA: Diagnosis not present

## 2018-09-11 DIAGNOSIS — M6249 Contracture of muscle, multiple sites: Secondary | ICD-10-CM | POA: Diagnosis not present

## 2018-09-11 DIAGNOSIS — M6281 Muscle weakness (generalized): Secondary | ICD-10-CM | POA: Diagnosis not present

## 2018-09-11 DIAGNOSIS — Z9181 History of falling: Secondary | ICD-10-CM

## 2018-09-11 DIAGNOSIS — R2689 Other abnormalities of gait and mobility: Secondary | ICD-10-CM | POA: Insufficient documentation

## 2018-09-11 DIAGNOSIS — R2681 Unsteadiness on feet: Secondary | ICD-10-CM | POA: Insufficient documentation

## 2018-09-11 NOTE — Therapy (Signed)
[facility] [address] [name], [name], [address] Phone: [phone]   Fax:  [phone]  Physical Therapy Treatment  Patient Details  Name: Steven Maldonado MRN: [id number] Date of Birth: 04-03-1941 Referring Provider (PT): Mechele Claude, [place]   Encounter Date: 09/11/2018  PT End of Session - 09/11/18 1256    Visit Number  4    Number of Visits  26    Date for PT Re-Evaluation  12/02/17    Authorization Type  Medicare & Federal BCBS    Authorization Time Period  Medicare guidelines, Fed BCBS waves co-pay.  75 visit limit PT, OT & ST combined with 1 used.    Authorization - Visit Number  1    Authorization - Number of Visits  12    PT Start Time  3825    PT Stop Time  1232    PT Time Calculation (min)  40 min    Equipment Utilized During Treatment  Gait belt    Activity Tolerance  Patient tolerated treatment well    Behavior During Therapy  WFL for tasks assessed/performed       Past Medical History:  Diagnosis Date  . Chronic diastolic CHF (congestive heart failure) ([place])   . CKD (chronic kidney disease), stage III ([place])   . Constipation   . Coronary artery disease    a. Cath February 2012 in [facility], occluded RCA with collaterals  . DM type 2 (diabetes mellitus, type 2) ([place])   . Essential hypertension   . Hyperlipidemia   . Neuropathy    feet  . Pacemaker    a. symptomatic brady after TAVR s/p MDT PPM by Dr. Curt Bears 12/04/17  . Persistent atrial fibrillation   . PONV (postoperative nausea and vomiting)    after valve surgery  . PVD (peripheral vascular disease) ([place])    a. s/p R popliteal artery stenosis tx with drug-coated balloon 05/2014, followed by Dr. Fletcher Anon.  . 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 Severe aortic stenosis    a. 12/02/17: s/p TAVR  . Skin cancer   . Sleep  apnea with use of continuous positive airway pressure (CPAP)    04-11-11 AHI was 32.9 and titrated to 15 cm H20, DME is [facility]  . Subclinical hypothyroidism     Past Surgical History:  Procedure Laterality Date  . ABDOMINAL ANGIOGRAM N/A 06/08/2014   Procedure: ABDOMINAL ANGIOGRAM;  Surgeon: Wellington Hampshire, MD;  Location: [facility] CATH LAB;  Service: Cardiovascular;  Laterality: N/A;  . ABDOMINAL AORTOGRAM N/A 04/09/2018   Procedure: ABDOMINAL AORTOGRAM;  Surgeon: Conrad Englewood, MD;  Location: [facility] CV LAB;  Service: Cardiovascular;  Laterality: N/A;  . AMPUTATION Left 04/17/2018   Procedure: LEFT FOOT 3RD RAY AMPUTATION;  Surgeon: Newt Minion, MD;  Location: [facility];  Service: Orthopedics;  Laterality: Left;  . AMPUTATION Left 05/28/2018   Procedure: LEFT AMPUTATION BELOW KNEE;  Surgeon: Wylene Simmer, MD;  Location: [facility];  Service: Orthopedics;  Laterality: Left;  . APPENDECTOMY  1965  . 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 CARDIAC CATHETERIZATION  12/2010  . CARDIOVERSION  07/2011  . CARDIOVERSION N/A 04/18/2014   Procedure: CARDIOVERSION;  Surgeon: Dorothy Spark, MD;  Location: [facility] ENDOSCOPY;  Service: Cardiovascular;  Laterality: N/A;  . CARDIOVERSION N/A 11/03/2015   Procedure: CARDIOVERSION;  Surgeon: Lelon Perla,  MD;  Location: [facility];  Service: Cardiovascular;  Laterality: N/A;  . CARDIOVERSION N/A 05/08/2017   Procedure: CARDIOVERSION;  Surgeon: Dorothy Spark, MD;  Location: [facility] ENDOSCOPY;  Service: Cardiovascular;  Laterality: N/A;  . CARDIOVERSION N/A 07/28/2017   Procedure: CARDIOVERSION;  Surgeon: Dorothy Spark, MD;  Location: [facility];  Service: Cardiovascular;  Laterality: N/A;  . LOWER EXTREMITY ANGIOGRAM N/A 06/08/2014   Procedure: LOWER EXTREMITY ANGIOGRAM;  Surgeon: Wellington Hampshire, MD;  Location: [facility] CATH LAB;  Service: Cardiovascular;  Laterality: N/A;  . LOWER EXTREMITY ANGIOGRAPHY Left 04/09/2018   Procedure: Lower Extremity Angiography;   Surgeon: Conrad Belzoni, MD;  Location: [facility] CV LAB;  Service: Cardiovascular;  Laterality: Left;  . PACEMAKER IMPLANT N/A 12/04/2017   Procedure: PACEMAKER IMPLANT;  Surgeon: Constance Haw, MD;  Location: [place] [facility] CV LAB;  Service: Cardiovascular;  Laterality: N/A;  . PERIPHERAL VASCULAR BALLOON ANGIOPLASTY Left 04/09/2018   Procedure: PERIPHERAL VASCULAR BALLOON ANGIOPLASTY;  Surgeon: Conrad [place], MD;  Location: [facility] CV LAB;  Service: Cardiovascular;  Laterality: Left;  SFA  . POPLITEAL ARTERY ANGIOPLASTY Right 06/08/2014   Archie Endo 06/08/2014  . RIGHT/LEFT HEART CATH AND CORONARY ANGIOGRAPHY N/A 10/08/2017   Procedure: RIGHT/LEFT HEART CATH AND CORONARY ANGIOGRAPHY;  Surgeon: Burnell Blanks, MD;  Location: [place] CV LAB;  Service: Cardiovascular;  Laterality: N/A;  . SKIN CANCER EXCISION Bilateral    "have had them cut off back of neck X 2; off left upper arm; right wrist, near right shoulder blade" (06/08/2014)  . TEE WITHOUT CARDIOVERSION N/A 12/02/2017   Procedure: TRANSESOPHAGEAL ECHOCARDIOGRAM (TEE);  Surgeon: Burnell Blanks, MD;  Location: [facility];  Service: Open Heart Surgery;  Laterality: N/A;  . TEMPORARY PACEMAKER N/A 12/04/2017   Procedure: TEMPORARY PACEMAKER;  Surgeon: Leonie Man, MD;  Location: [place] [facility] CV LAB;  Service: Cardiovascular;  Laterality: N/A;  . TRANSCATHETER AORTIC VALVE REPLACEMENT, TRANSFEMORAL N/A 12/02/2017   Procedure: TRANSCATHETER AORTIC VALVE REPLACEMENT, TRANSFEMORAL;  Surgeon: Burnell Blanks, MD;  Location: [facility];  Service: Open Heart Surgery;  Laterality: N/A;  using Edwards Sapien 3 Transcatheter Heart Valve size 36mm    There were no vitals filed for this visit.  Subjective Assessment - 09/11/18 1203    Subjective  No falls to report, Pt went to [facility] yesterday for alignment and padding added. HEP is going well.     Patient is accompained by:  Family member   Wife   Pertinent History  L TTA,  CAD, PAF,  pacemaker, DM2, neuropathy, CKD, HTN,     Limitations  Lifting;Standing;Walking;House hold activities    Patient Stated Goals  To use prosthesis in community, travel (uses [place]), take of himself so wife does not have to do it. Housework.     Currently in Pain?  Yes    Pain Score  4     Pain Orientation  Left    Pain Descriptors / Indicators  Sore;Pressure    Pain Type  Acute pain    Pain Onset  In the past 7 days    Pain Frequency  Intermittent    Aggravating Factors   weight shift    Pain Relieving Factors  reducing weight shift         [facility] Adult PT Treatment/Exercise - 09/11/18 1246      Ambulation/Gait   Ambulation/Gait  Yes    Ambulation/Gait Assistance  5: Supervision;4: Min guard    Ambulation/Gait Assistance Details  Negotiating around obstacles  in walkway, pt began to fatigue towards end of gait session, VC's for pacing, and proper weight shift to decrease UE reliance.     Ambulation Distance (Feet)  230 Feet    Assistive device  Rolling walker;Prosthesis    Gait Pattern  Step-through pattern;Decreased step length - right;Decreased stance time - left;Decreased stride length;Decreased hip/knee flexion - left;Decreased weight shift to left;Left hip hike;Left circumduction;Antalgic;Lateral hip instability;Trunk flexed;Abducted - left    Ambulation Surface  Level;Indoor      Neuro Re-ed    Neuro Re-ed Details   Reaching forward/to floor for objects varrying in size/weight in standing using RW for SUE support alt. LE's in reaching with no LOB noted, VC's for proper technique.         PT Short Term Goals - 09/02/18 1900      PT SHORT TERM GOAL #1   Title  Patient donnes prosthesis with moderate assist for liner & minimal assist for prosthesis. (All STGs Target Date: 10/02/2018)    Time  1    Period  Months    Status  New    Target Date  10/02/18      PT SHORT TERM GOAL #2   Title  Patient tolerates prosthesis wear >12hrs daily without skin issues.     Time  1     Period  Months    Status  New    Target Date  10/02/18      PT SHORT TERM GOAL #3   Title  Patient performs standing balance with RW & prosthesis reaching 10", to floor and managing pants for toileting.     Time  1    Period  Months    Status  New    Target Date  10/02/18      PT SHORT TERM GOAL #4   Title  Patient ambulates 200' with RW & prosthesis with supervision.     Time  1    Period  Months    Status  New    Target Date  10/02/18      PT SHORT TERM GOAL #5   Title  Patient negotiates ramps & curbs with RW and stairs with 2 rails with minimal assist.     Time  1    Period  Months    Status  New    Target Date  10/02/18        PT Long Term Goals - 09/02/18 1800      PT LONG TERM GOAL #1   Title  Patient verbalizes & demonstrates proper prosthetic care including donning / doffing independently to enable safe use of prosthesis (All LTGs Target Date: 11/27/2018)    Time  3    Period  Months    Status  New    Target Date  11/27/18      PT LONG TERM GOAL #2   Title  Patient tolerates prosthesis wear daily for >90% of awake hours without skin or limb pain issues to enable function throughout his day.     Time  3    Period  Months    Status  New    Target Date  11/27/18      PT LONG TERM GOAL #3   Title  Berg Balance >36/56 to indicate lower fall risk & less dependency in standing ADLs.     Time  3    Period  Months    Status  New    Target Date  11/27/18  PT LONG TERM GOAL #4   Title  Patient ambulates 500' with RW (or rollator) & prosthesis outdoors on paved or grass up to 50' surfaces modified independent for community mobility.     Time  3    Period  Months    Status  New    Target Date  11/27/18      PT LONG TERM GOAL #5   Title  Patient negotiates ramps, curbs with walker & stairs 1 rail /cane modified independent for community access.     Time  3    Period  Months    Status  New    Target Date  11/27/18       Plan - 09/11/18 1256     Clinical Impression Statement  Today skilled session focused on gait training with RW/Prosthesis while avoiding excessive WB on BUE's while increasing distance, some fatigue towards end of gait, reaching for objects fwds/to floor with no LOB, RW for support. Pt is making steady progress and should continue to benefit from skilled PT session to progress towards unmet goals.     Rehab Potential  Good    PT Frequency  2x / week    PT Duration  Other (comment)   13 weeks (90 days)   PT Treatment/Interventions  ADLs/Self Care Home Management;[place] Repostioning;DME Instruction;Gait training;Stair training;Functional mobility training;Therapeutic activities;Therapeutic exercise;Balance training;Neuromuscular re-education;Patient/family education;Prosthetic Training;Manual techniques;Vestibular    PT Next Visit Plan  review prosthetic care, prosthetic gait with RW including ramps, curbs and stairs with 2 rails, high level balance.     Consulted and Agree with Plan of Care  Patient;Family member/caregiver    Family Member Consulted  wife       Patient will benefit from skilled therapeutic intervention in order to improve the following deficits and impairments:  Abnormal gait, Decreased activity tolerance, Decreased balance, Decreased endurance, Decreased knowledge of use of DME, Decreased mobility, Impaired flexibility, Decreased range of motion, Decreased strength, Decreased skin integrity, Dizziness, Postural dysfunction, Prosthetic Dependency  Visit Diagnosis: Abnormal posture  Muscle weakness (generalized)  Contracture of muscle, multiple sites  Unsteadiness on feet  Other abnormalities of gait and mobility  History of falling     Problem List Patient Active Problem List   Diagnosis Date Noted  . Type 2 diabetes mellitus without complication ([place]) 02/40/9735  . Bradycardia 09/01/2018  . Gangrene of left foot ([place]) 05/28/2018  . 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 Obstructive sleep apnea syndrome 05/04/2018  . Obstructive sleep apnea treated with continuous positive airway pressure (CPAP) 05/04/2018  . Type II diabetes mellitus, uncontrolled ([place]) 04/22/2018  . Atherosclerosis of native arteries of the extremities with gangrene ([place]) 04/09/2018  . Atherosclerosis of native artery of extremity ([place]) 04/09/2018  . Cellulitis in diabetic foot ([place]) 04/04/2018  . Paroxysmal atrial fibrillation ([place]) 12/05/2017  . Cardiac pacemaker in situ   . Symptomatic bradycardia   . Asystole ([place])   . 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 History of aortic valve replacement 12/02/2017  . Severe aortic stenosis   . Bilateral impacted cerumen 01/22/2016  . Essential hypertension 11/14/2015  . Peripheral vascular disease ([place]) 05/24/2014  . Chronic kidney disease 08/30/2013  . Hypothyroidism 08/30/2013  . 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 Diabetes mellitus type 2, uncomplicated ([place])   . Aortic valve stenosis 07/25/2011  . Hyperlipidemia 01/10/2011  . CAD (coronary artery disease) 01/10/2011  . Coronary  arteriosclerosis 01/10/2011   Jalecia Leon, PTA  Cathye Kreiter A Rhiannon Sassaman 09/11/2018, 1:06 PM  [facility] [address] [place], [name], [address] Phone: [phone]   Fax:  [phone]  Name: Steven Maldonado MRN: [id number] Date of Birth: [birth year]

## 2018-09-15 ENCOUNTER — Ambulatory Visit: Payer: Medicare Other | Admitting: Physical Therapy

## 2018-09-15 ENCOUNTER — Other Ambulatory Visit: Payer: Self-pay

## 2018-09-15 ENCOUNTER — Telehealth: Payer: Self-pay | Admitting: Endocrinology

## 2018-09-15 DIAGNOSIS — Z9181 History of falling: Secondary | ICD-10-CM | POA: Diagnosis not present

## 2018-09-15 DIAGNOSIS — M6281 Muscle weakness (generalized): Secondary | ICD-10-CM | POA: Diagnosis not present

## 2018-09-15 DIAGNOSIS — R2689 Other abnormalities of gait and mobility: Secondary | ICD-10-CM | POA: Diagnosis not present

## 2018-09-15 DIAGNOSIS — R293 Abnormal posture: Secondary | ICD-10-CM

## 2018-09-15 DIAGNOSIS — R2681 Unsteadiness on feet: Secondary | ICD-10-CM | POA: Diagnosis not present

## 2018-09-15 DIAGNOSIS — M6249 Contracture of muscle, multiple sites: Secondary | ICD-10-CM | POA: Diagnosis not present

## 2018-09-15 MED ORDER — INSULIN DEGLUDEC 200 UNIT/ML ~~LOC~~ SOPN: 65.0000 [IU] | PEN_INJECTOR | Freq: Every day | SUBCUTANEOUS | 2 refills | Status: DC

## 2018-09-15 NOTE — Telephone Encounter (Signed)
LMTCB to discuss the note below

## 2018-09-15 NOTE — Telephone Encounter (Signed)
Patient stated that when they came into see Dr Dwyane Dee he was wanting to switch him from [facility] to something different.  They are not sure if he wants him switching now or let him stay on this until his next visit.   They will need this sent in if he wants him to continue this  Patient has only three pens left  Please advise    [facility] [place] to Registered [facility] Sites

## 2018-09-15 NOTE — Telephone Encounter (Signed)
Steven Maldonado has been sent to the patients preferred pharmacy

## 2018-09-15 NOTE — Telephone Encounter (Signed)
Please see my last note.  He is going to switch to [place] U-200, same dose as Steven Maldonado would prefer to do it now

## 2018-09-15 NOTE — Telephone Encounter (Signed)
Please advise 

## 2018-09-15 NOTE — Therapy (Signed)
[facility] [address] [place], [name], [address] Phone: [phone]   Fax:  [phone]  Physical Therapy Treatment  Patient Details  Name: Steven Maldonado MRN: [id number] Date of Birth: 11-26-1940 Referring Provider (PT): Mechele Claude, [place]   Encounter Date: 09/15/2018  PT End of Session - 09/15/18 1553    Visit Number  5    Number of Visits  26    Date for PT Re-Evaluation  12/02/17    Authorization Type  Medicare & Federal BCBS    Authorization Time Period  Medicare guidelines, Fed BCBS waves co-pay.  75 visit limit PT, OT & ST combined with 1 used.    Authorization - Number of Visits  75    PT Start Time  6546    PT Stop Time  1530    PT Time Calculation (min)  44 min    Equipment Utilized During Treatment  Gait belt    Activity Tolerance  Patient tolerated treatment well    Behavior During Therapy  WFL for tasks assessed/performed       Past Medical History:  Diagnosis Date  . Chronic diastolic CHF (congestive heart failure) ([place])   . CKD (chronic kidney disease), stage III ([place])   . Constipation   . Coronary artery disease    a. Cath February 2012 in [facility], occluded RCA with collaterals  . DM type 2 (diabetes mellitus, type 2) ([place])   . Essential hypertension   . Hyperlipidemia   . Neuropathy    feet  . Pacemaker    a. symptomatic brady after TAVR s/p MDT PPM by Dr. Curt Bears 12/04/17  . Persistent atrial fibrillation   . PONV (postoperative nausea and vomiting)    after valve surgery  . PVD (peripheral vascular disease) ([place])    a. s/p R popliteal artery stenosis tx with drug-coated balloon 05/2014, followed by Dr. Fletcher Anon.  . 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 Severe aortic stenosis    a. 12/02/17: s/p TAVR  . Skin cancer   . Sleep apnea with use of continuous  positive airway pressure (CPAP)    04-11-11 AHI was 32.9 and titrated to 15 cm H20, DME is [facility]  . Subclinical hypothyroidism     Past Surgical History:  Procedure Laterality Date  . ABDOMINAL ANGIOGRAM N/A 06/08/2014   Procedure: ABDOMINAL ANGIOGRAM;  Surgeon: Wellington Hampshire, MD;  Location: [facility] CATH LAB;  Service: Cardiovascular;  Laterality: N/A;  . ABDOMINAL AORTOGRAM N/A 04/09/2018   Procedure: ABDOMINAL AORTOGRAM;  Surgeon: Conrad [place], MD;  Location: [place] CV LAB;  Service: Cardiovascular;  Laterality: N/A;  . AMPUTATION Left 04/17/2018   Procedure: LEFT FOOT 3RD RAY AMPUTATION;  Surgeon: Newt Minion, MD;  Location: [place];  Service: Orthopedics;  Laterality: Left;  . AMPUTATION Left 05/28/2018   Procedure: LEFT AMPUTATION BELOW KNEE;  Surgeon: Wylene Simmer, MD;  Location: [place];  Service: Orthopedics;  Laterality: Left;  . APPENDECTOMY  1965  . 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 CARDIAC CATHETERIZATION  12/2010  . CARDIOVERSION  07/2011  . CARDIOVERSION N/A 04/18/2014   Procedure: CARDIOVERSION;  Surgeon: Dorothy Spark, MD;  Location: [facility] ENDOSCOPY;  Service: Cardiovascular;  Laterality: N/A;  . CARDIOVERSION N/A 11/03/2015   Procedure: CARDIOVERSION;  Surgeon: Lelon Perla, MD;  Location: [place];  Service: Cardiovascular;  Laterality: N/A;  . CARDIOVERSION N/A 05/08/2017   Procedure: CARDIOVERSION;  Surgeon: Dorothy Spark, MD;  Location: [facility] ENDOSCOPY;  Service: Cardiovascular;  Laterality: N/A;  . CARDIOVERSION N/A 07/28/2017   Procedure: CARDIOVERSION;  Surgeon: Dorothy Spark, MD;  Location: [place];  Service: Cardiovascular;  Laterality: N/A;  . LOWER EXTREMITY ANGIOGRAM N/A 06/08/2014   Procedure: LOWER EXTREMITY ANGIOGRAM;  Surgeon: Wellington Hampshire, MD;  Location: [facility] CATH LAB;  Service: Cardiovascular;  Laterality: N/A;  . LOWER EXTREMITY ANGIOGRAPHY Left 04/09/2018   Procedure: Lower Extremity Angiography;  Surgeon: Conrad Boykin, MD;   Location: [place] CV LAB;  Service: Cardiovascular;  Laterality: Left;  . PACEMAKER IMPLANT N/A 12/04/2017   Procedure: PACEMAKER IMPLANT;  Surgeon: Constance Haw, MD;  Location: [place] CV LAB;  Service: Cardiovascular;  Laterality: N/A;  . PERIPHERAL VASCULAR BALLOON ANGIOPLASTY Left 04/09/2018   Procedure: PERIPHERAL VASCULAR BALLOON ANGIOPLASTY;  Surgeon: Conrad [place], MD;  Location: [place] CV LAB;  Service: Cardiovascular;  Laterality: Left;  SFA  . POPLITEAL ARTERY ANGIOPLASTY Right 06/08/2014   Archie Endo 06/08/2014  . RIGHT/LEFT HEART CATH AND CORONARY ANGIOGRAPHY N/A 10/08/2017   Procedure: RIGHT/LEFT HEART CATH AND CORONARY ANGIOGRAPHY;  Surgeon: Burnell Blanks, MD;  Location: [place] CV LAB;  Service: Cardiovascular;  Laterality: N/A;  . SKIN CANCER EXCISION Bilateral    "have had them cut off back of neck X 2; off left upper arm; right wrist, near right shoulder blade" (06/08/2014)  . TEE WITHOUT CARDIOVERSION N/A 12/02/2017   Procedure: TRANSESOPHAGEAL ECHOCARDIOGRAM (TEE);  Surgeon: Burnell Blanks, MD;  Location: [facility];  Service: Open Heart Surgery;  Laterality: N/A;  . TEMPORARY PACEMAKER N/A 12/04/2017   Procedure: TEMPORARY PACEMAKER;  Surgeon: Leonie Man, MD;  Location: [place] CV LAB;  Service: Cardiovascular;  Laterality: N/A;  . TRANSCATHETER AORTIC VALVE REPLACEMENT, TRANSFEMORAL N/A 12/02/2017   Procedure: TRANSCATHETER AORTIC VALVE REPLACEMENT, TRANSFEMORAL;  Surgeon: Burnell Blanks, MD;  Location: [place];  Service: Open Heart Surgery;  Laterality: N/A;  using Edwards Sapien 3 Transcatheter Heart Valve size 61mm    There were no vitals filed for this visit.  Subjective Assessment - 09/15/18 1450    Subjective  No falls to report.  He said the doctor is going to change his insulin dose.    Patient is accompained by:  Family member    Limitations  Lifting;Standing;Walking;House hold activities    Patient Stated Goals  To  use prosthesis in community, travel (uses [employer]ianne Lei), take of himself so wife does not have to do it. Housework.     Currently in Pain?  Yes    Pain Score  3     Pain Orientation  Left    Pain Onset  In the past 7 days       [facility] Adult PT Treatment/Exercise - 09/15/18 1537      Transfers   Transfers  Sit to Stand;Stand to Sit    Sit to Stand  6: Modified independent (Device/Increase time);With upper extremity assist;From chair/3-in-1;With armrests    Sit to Stand Details (indicate cue type and reason)  needs RW to stabilize on with standing    Stand to Sit  6: Modified independent (Device/Increase time);With upper extremity assist;To chair/3-in-1;With armrests    Stand to Sit Details  Needs RW support to sit safely and stabilized.     Number of Reps  Other reps (comment)   4   Comments  increase use of UE,  VCs for increased LE       Ambulation/Gait   Ambulation/Gait  Yes    Ambulation/Gait Assistance  5: Supervision;4: Min guard    Ambulation/Gait Assistance Details  verbal cues to decrease UE weight bearing and increase LE weight bearing symmetrically.    Ambulation Distance (Feet)  360 Feet    Assistive device  Rolling walker;Prosthesis    Gait Pattern  Step-through pattern;Decreased step length - right;Decreased stance time - left;Decreased stride length;Decreased hip/knee flexion - left;Decreased weight shift to left;Left hip hike;Left circumduction;Antalgic;Lateral hip instability;Trunk flexed;Abducted - left    Ambulation Surface  Level;Unlevel    Stairs  Yes    Stairs Assistance  4: Min guard    Stairs Assistance Details (indicate cue type and reason)  cues needed for weight shifitng, posture and hand advancement along rails     Stair Management Technique  Two rails;Step to pattern    Number of Stairs  4    Height of Stairs  6    Ramp  5: Supervision;4: Min assist    Ramp Details (indicate cue type and reason)  VCs for technique with rolling walker and safety awareness.    Curb   5: Supervision;4: Min assist    Curb Details (indicate cue type and reason)  VCs for technique to keep walker close, stay with in walker when decending to avoid excessive trunk flexion    Gait Comments  Patient able to walk 2 laps (230 ft) before first rest break requiring verbal cues to increase stride length and decrease use of upper extremities. Verbal cues for upright posture and equal weight shifting were given.      Prosthetics   Prosthetic Care Comments   Educated on importance and keeping limb dry and carrying extra socks for ply adjustment through out day. worked on proper donning of liner with use of mirror in bloc practice with cues provided on technique to assist pt. Spouse has trip planned for next week and pt needs to become independent with donning. They are looking at getting home health aide to assist, spouse plans to teach her this weekend how to assist pt. Encouraged pt to continue to work on self donning, pt verbalized understanding.    Current prosthetic wear tolerance (days/week)   daily    Current prosthetic wear tolerance (#hours/day)   8-10 hours a day    Current prosthetic weight-bearing tolerance (hours/day)   Patient is ambulating throughout house several times a day.    Residual limb condition   Rednesson around limb due to heat but was dry with good skin integrity.    Education Provided  Skin check;Ply sock cleaning;Proper Donning;Proper Doffing;Correct ply sock adjustment;Proper weight-bearing schedule/adjustment    Person(s) Educated  Patient;Spouse    Education Method  Explanation;Demonstration;Tactile cues;Other (comment)   recommended using full length mirror at home   Donning Prosthesis  Minimal assist   progressing to min guard assist   Doffing Prosthesis  Minimal assist   cues for technique and use of mirror,         PT Short Term Goals - 09/02/18 1900      PT SHORT TERM GOAL #1   Title  Patient donnes prosthesis with moderate assist for liner &  minimal assist for prosthesis. (All STGs Target Date: 10/02/2018)    Time  1    Period  Months    Status  New    Target Date  10/02/18      PT SHORT TERM GOAL #2  Title  Patient tolerates prosthesis wear >12hrs daily without skin issues.     Time  1    Period  Months    Status  New    Target Date  10/02/18      PT SHORT TERM GOAL #3   Title  Patient performs standing balance with RW & prosthesis reaching 10", to floor and managing pants for toileting.     Time  1    Period  Months    Status  New    Target Date  10/02/18      PT SHORT TERM GOAL #4   Title  Patient ambulates 200' with RW & prosthesis with supervision.     Time  1    Period  Months    Status  New    Target Date  10/02/18      PT SHORT TERM GOAL #5   Title  Patient negotiates ramps & curbs with RW and stairs with 2 rails with minimal assist.     Time  1    Period  Months    Status  New    Target Date  10/02/18        PT Long Term Goals - 09/02/18 1800      PT LONG TERM GOAL #1   Title  Patient verbalizes & demonstrates proper prosthetic care including donning / doffing independently to enable safe use of prosthesis (All LTGs Target Date: 11/27/2018)    Time  3    Period  Months    Status  New    Target Date  11/27/18      PT LONG TERM GOAL #2   Title  Patient tolerates prosthesis wear daily for >90% of awake hours without skin or limb pain issues to enable function throughout his day.     Time  3    Period  Months    Status  New    Target Date  11/27/18      PT LONG TERM GOAL #3   Title  Berg Balance >36/56 to indicate lower fall risk & less dependency in standing ADLs.     Time  3    Period  Months    Status  New    Target Date  11/27/18      PT LONG TERM GOAL #4   Title  Patient ambulates 500' with RW (or rollator) & prosthesis outdoors on paved or grass up to 50' surfaces modified independent for community mobility.     Time  3    Period  Months    Status  New    Target Date  11/27/18       PT LONG TERM GOAL #5   Title  Patient negotiates ramps, curbs with walker & stairs 1 rail /cane modified independent for community access.     Time  3    Period  Months    Status  New    Target Date  11/27/18            Plan - 09/15/18 1554    Clinical Impression Statement  Todays treatment focused on donning/doffing prosthesis liner to promote independence as well as gait training with increased weight bearing tolerance on prosthesis.  Patient would benefit from further therapy to increase prosthetic self care independence,increase weight bearing tolerance, and address balance deficits.    Clinical Presentation  Evolving    Clinical Decision Making  Moderate    Rehab Potential  Good    PT  Frequency  2x / week    PT Duration  Other (comment)    PT Treatment/Interventions  ADLs/Self Care Home Management;Canalith Repostioning;DME Instruction;Gait training;Stair training;Functional mobility training;Therapeutic activities;Therapeutic exercise;Balance training;Neuromuscular re-education;Patient/family education;Prosthetic Training;Manual techniques;Vestibular    PT Next Visit Plan  donning/doffing, gait and high level balance    Consulted and Agree with Plan of Care  Patient;Family member/caregiver    Family Member Consulted  wife       Patient will benefit from skilled therapeutic intervention in order to improve the following deficits and impairments:  Abnormal gait, Decreased activity tolerance, Decreased balance, Decreased endurance, Decreased knowledge of use of DME, Decreased mobility, Impaired flexibility, Decreased range of motion, Decreased strength, Decreased skin integrity, Dizziness, Postural dysfunction, Prosthetic Dependency  Visit Diagnosis: Other abnormalities of gait and mobility  Abnormal posture  Muscle weakness (generalized)     Problem List Patient Active Problem List   Diagnosis Date Noted  . Type 2 diabetes mellitus without complication ([place])  75/17/0017  . Bradycardia 09/01/2018  . Gangrene of left foot ([place]) 05/28/2018  . 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 Obstructive sleep apnea syndrome 05/04/2018  . Obstructive sleep apnea treated with continuous positive airway pressure (CPAP) 05/04/2018  . Type II diabetes mellitus, uncontrolled ([place]) 04/22/2018  . Atherosclerosis of native arteries of the extremities with gangrene ([place]) 04/09/2018  . Atherosclerosis of native artery of extremity ([place]) 04/09/2018  . Cellulitis in diabetic foot ([place]) 04/04/2018  . Paroxysmal atrial fibrillation ([place]) 12/05/2017  . Cardiac pacemaker in situ   . Symptomatic bradycardia   . Asystole ([place])   . 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 History of aortic valve replacement 12/02/2017  . Severe aortic stenosis   . Bilateral impacted cerumen 01/22/2016  . Essential hypertension 11/14/2015  . Peripheral vascular disease ([place]) 05/24/2014  . Chronic kidney disease 08/30/2013  . Hypothyroidism 08/30/2013  . 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 Diabetes mellitus type 2, uncomplicated ([place])   . Aortic valve stenosis 07/25/2011  . Hyperlipidemia 01/10/2011  . CAD (coronary artery disease) 01/10/2011  . Coronary arteriosclerosis 01/10/2011    Anida Deol SPTA 09/15/2018, 4:00 PM  [facility] [address] [place], [name], [address] Phone: [phone]   Fax:  [phone]  Name: EBENEZER MCCASKEY MRN: [id number] Date of Birth: 01-24-1941

## 2018-09-16 ENCOUNTER — Telehealth: Payer: Self-pay

## 2018-09-16 ENCOUNTER — Other Ambulatory Visit: Payer: Self-pay

## 2018-09-16 MED ORDER — INSULIN LISPRO (1 UNIT DIAL) 100 UNIT/ML (KWIKPEN): PEN_INJECTOR | SUBCUTANEOUS | 2 refills | Status: DC

## 2018-09-16 NOTE — Telephone Encounter (Signed)
This has been sent

## 2018-09-16 NOTE — Telephone Encounter (Signed)
Patient requesting a refill on Humalog which we have not previously filled for him last note says 5 units three times a day with meals is that appropriate please advise

## 2018-09-16 NOTE — Telephone Encounter (Signed)
May refill with 5 units 3 times daily instructions

## 2018-09-17 ENCOUNTER — Other Ambulatory Visit: Payer: Self-pay | Admitting: Physician Assistant

## 2018-09-18 ENCOUNTER — Ambulatory Visit: Payer: Medicare Other

## 2018-09-18 DIAGNOSIS — Z9181 History of falling: Secondary | ICD-10-CM | POA: Diagnosis not present

## 2018-09-18 DIAGNOSIS — R2681 Unsteadiness on feet: Secondary | ICD-10-CM | POA: Diagnosis not present

## 2018-09-18 DIAGNOSIS — M6249 Contracture of muscle, multiple sites: Secondary | ICD-10-CM | POA: Diagnosis not present

## 2018-09-18 DIAGNOSIS — M6281 Muscle weakness (generalized): Secondary | ICD-10-CM

## 2018-09-18 DIAGNOSIS — R2689 Other abnormalities of gait and mobility: Secondary | ICD-10-CM | POA: Diagnosis not present

## 2018-09-18 DIAGNOSIS — R293 Abnormal posture: Secondary | ICD-10-CM

## 2018-09-18 NOTE — Therapy (Signed)
[facility] [address] [place], [name], [address] Phone: [phone]   Fax:  [phone]  Physical Therapy Treatment  Patient Details  Name: Steven Maldonado MRN: [id number] Date of Birth: 1941/03/06 Referring Provider (PT): Mechele Claude, [place]   Encounter Date: 09/18/2018  PT End of Session - 09/18/18 1159    Visit Number  6    Number of Visits  26    Date for PT Re-Evaluation  12/02/17    Authorization Type  Medicare & Federal BCBS    Authorization Time Period  Medicare guidelines, Fed BCBS waves co-pay.  75 visit limit PT, OT & ST combined with 1 used.    Authorization - Number of Visits  75    PT Start Time  1150    PT Stop Time  1230    PT Time Calculation (min)  40 min    Equipment Utilized During Treatment  Gait belt    Activity Tolerance  Patient tolerated treatment well    Behavior During Therapy  WFL for tasks assessed/performed       Past Medical History:  Diagnosis Date  . Chronic diastolic CHF (congestive heart failure) ([place])   . CKD (chronic kidney disease), stage III ([place])   . Constipation   . Coronary artery disease    a. Cath February 2012 in [facility], occluded RCA with collaterals  . DM type 2 (diabetes mellitus, type 2) ([place])   . Essential hypertension   . Hyperlipidemia   . Neuropathy    feet  . Pacemaker    a. symptomatic brady after TAVR s/p MDT PPM by Dr. Curt Bears 12/04/17  . Persistent atrial fibrillation   . PONV (postoperative nausea and vomiting)    after valve surgery  . PVD (peripheral vascular disease) ([place])    a. s/p R popliteal artery stenosis tx with drug-coated balloon 05/2014, followed by Dr. Fletcher Anon.  . 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 Severe aortic stenosis    a. 12/02/17: s/p TAVR  . Skin cancer   . Sleep apnea with use of continuous  positive airway pressure (CPAP)    04-11-11 AHI was 32.9 and titrated to 15 cm H20, DME is [facility]  . Subclinical hypothyroidism     Past Surgical History:  Procedure Laterality Date  . ABDOMINAL ANGIOGRAM N/A 06/08/2014   Procedure: ABDOMINAL ANGIOGRAM;  Surgeon: Wellington Hampshire, MD;  Location: [facility] CATH LAB;  Service: Cardiovascular;  Laterality: N/A;  . ABDOMINAL AORTOGRAM N/A 04/09/2018   Procedure: ABDOMINAL AORTOGRAM;  Surgeon: Conrad Cranston, MD;  Location: [facility] CV LAB;  Service: Cardiovascular;  Laterality: N/A;  . AMPUTATION Left 04/17/2018   Procedure: LEFT FOOT 3RD RAY AMPUTATION;  Surgeon: Newt Minion, MD;  Location: [facility];  Service: Orthopedics;  Laterality: Left;  . AMPUTATION Left 05/28/2018   Procedure: LEFT AMPUTATION BELOW KNEE;  Surgeon: Wylene Simmer, MD;  Location: [facility];  Service: Orthopedics;  Laterality: Left;  . APPENDECTOMY  1965  . 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 CARDIAC CATHETERIZATION  12/2010  . CARDIOVERSION  07/2011  . CARDIOVERSION N/A 04/18/2014   Procedure: CARDIOVERSION;  Surgeon: Dorothy Spark, MD;  Location: [facility] ENDOSCOPY;  Service: Cardiovascular;  Laterality: N/A;  . CARDIOVERSION N/A 11/03/2015   Procedure: CARDIOVERSION;  Surgeon: Lelon Perla, MD;  Location: [facility];  Service: Cardiovascular;  Laterality: N/A;  . CARDIOVERSION N/A 05/08/2017   Procedure: CARDIOVERSION;  Surgeon: Dorothy Spark, MD;  Location: [facility] ENDOSCOPY;  Service: Cardiovascular;  Laterality: N/A;  . CARDIOVERSION N/A 07/28/2017   Procedure: CARDIOVERSION;  Surgeon: Dorothy Spark, MD;  Location: [facility];  Service: Cardiovascular;  Laterality: N/A;  . LOWER EXTREMITY ANGIOGRAM N/A 06/08/2014   Procedure: LOWER EXTREMITY ANGIOGRAM;  Surgeon: Wellington Hampshire, MD;  Location: [facility] CATH LAB;  Service: Cardiovascular;  Laterality: N/A;  . LOWER EXTREMITY ANGIOGRAPHY Left 04/09/2018   Procedure: Lower Extremity Angiography;  Surgeon: Conrad Shelby, MD;   Location: [facility] CV LAB;  Service: Cardiovascular;  Laterality: Left;  . PACEMAKER IMPLANT N/A 12/04/2017   Procedure: PACEMAKER IMPLANT;  Surgeon: Constance Haw, MD;  Location: [facility] CV LAB;  Service: Cardiovascular;  Laterality: N/A;  . PERIPHERAL VASCULAR BALLOON ANGIOPLASTY Left 04/09/2018   Procedure: PERIPHERAL VASCULAR BALLOON ANGIOPLASTY;  Surgeon: Conrad Sidell, MD;  Location: [facility] CV LAB;  Service: Cardiovascular;  Laterality: Left;  SFA  . POPLITEAL ARTERY ANGIOPLASTY Right 06/08/2014   Archie Endo 06/08/2014  . RIGHT/LEFT HEART CATH AND CORONARY ANGIOGRAPHY N/A 10/08/2017   Procedure: RIGHT/LEFT HEART CATH AND CORONARY ANGIOGRAPHY;  Surgeon: Burnell Blanks, MD;  Location: [facility] CV LAB;  Service: Cardiovascular;  Laterality: N/A;  . SKIN CANCER EXCISION Bilateral    "have had them cut off back of neck X 2; off left upper arm; right wrist, near right shoulder blade" (06/08/2014)  . TEE WITHOUT CARDIOVERSION N/A 12/02/2017   Procedure: TRANSESOPHAGEAL ECHOCARDIOGRAM (TEE);  Surgeon: Burnell Blanks, MD;  Location: [facility];  Service: Open Heart Surgery;  Laterality: N/A;  . TEMPORARY PACEMAKER N/A 12/04/2017   Procedure: TEMPORARY PACEMAKER;  Surgeon: Leonie Man, MD;  Location: [facility] CV LAB;  Service: Cardiovascular;  Laterality: N/A;  . TRANSCATHETER AORTIC VALVE REPLACEMENT, TRANSFEMORAL N/A 12/02/2017   Procedure: TRANSCATHETER AORTIC VALVE REPLACEMENT, TRANSFEMORAL;  Surgeon: Burnell Blanks, MD;  Location: [facility];  Service: Open Heart Surgery;  Laterality: N/A;  using Edwards Sapien 3 Transcatheter Heart Valve size 44mm    There were no vitals filed for this visit.  Subjective Assessment - 09/18/18 1156    Subjective  No falls to report. Pt reports wearing prosthesis 6hrs/2xday with no skin issues. Pt still reports having trouble with donning prosthesis.    Patient is accompained by:  Family member    Pertinent History  L TTA,  CAD, PAF,  pacemaker, DM2, neuropathy, CKD, HTN,     Limitations  Lifting;Standing;Walking;House hold activities    Patient Stated Goals  To use prosthesis in community, travel (uses [employer] Lei), take of himself so wife does not have to do it. Housework.     Currently in Pain?  Yes    Pain Score  5     Pain Location  Leg   residual limb   Pain Orientation  Left    Pain Descriptors / Indicators  Sore;Pressure    Pain Type  Acute pain    Pain Onset  In the past 7 days    Pain Frequency  Intermittent    Aggravating Factors   Weight shift, ambulation, incline/ramp    Pain Relieving Factors  sitting, reducing weight shift.         [facility] Adult PT Treatment/Exercise - 09/18/18 1200      Transfers   Transfers  Sit to Stand;Stand to Sit    Sit to Stand  5: Supervision  Sit to Stand Details (indicate cue type and reason)  Pt seated on green disc with BUE's at 90deg with VC's for pacing and technqiue.     Stand to Sit  5: Supervision    Stand to Sit Details  VC's for controlled descent.     Number of Reps  10 reps;2 sets      Ambulation/Gait   Ambulation/Gait  Yes    Ambulation/Gait Assistance  5: Supervision    Ambulation/Gait Assistance Details  Instructed pt to only place palms down on handles of RW to decrease weightshift on UE's and transfer to BLE's.     Ambulation Distance (Feet)  230 Feet   115   Assistive device  Rolling walker;Prosthesis    Gait Pattern  Step-through pattern;Decreased step length - right;Decreased stance time - left;Decreased stride length;Decreased hip/knee flexion - left;Decreased weight shift to left;Left hip hike;Left circumduction;Antalgic;Lateral hip instability;Trunk flexed;Abducted - left    Ambulation Surface  Level;Indoor      High Level Balance   High Level Balance Activities  Side stepping;Backward walking    High Level Balance Comments  At parallel bars on red mat with SUE support required walking fwds/bkwds/side stepping with cues for posture, proper  weight shift and technique.          PT Short Term Goals - 09/02/18 1900      PT SHORT TERM GOAL #1   Title  Patient donnes prosthesis with moderate assist for liner & minimal assist for prosthesis. (All STGs Target Date: 10/02/2018)    Time  1    Period  Months    Status  New    Target Date  10/02/18      PT SHORT TERM GOAL #2   Title  Patient tolerates prosthesis wear >12hrs daily without skin issues.     Time  1    Period  Months    Status  New    Target Date  10/02/18      PT SHORT TERM GOAL #3   Title  Patient performs standing balance with RW & prosthesis reaching 10", to floor and managing pants for toileting.     Time  1    Period  Months    Status  New    Target Date  10/02/18      PT SHORT TERM GOAL #4   Title  Patient ambulates 200' with RW & prosthesis with supervision.     Time  1    Period  Months    Status  New    Target Date  10/02/18      PT SHORT TERM GOAL #5   Title  Patient negotiates ramps & curbs with RW and stairs with 2 rails with minimal assist.     Time  1    Period  Months    Status  New    Target Date  10/02/18        PT Long Term Goals - 09/02/18 1800      PT LONG TERM GOAL #1   Title  Patient verbalizes & demonstrates proper prosthetic care including donning / doffing independently to enable safe use of prosthesis (All LTGs Target Date: 11/27/2018)    Time  3    Period  Months    Status  New    Target Date  11/27/18      PT LONG TERM GOAL #2   Title  Patient tolerates prosthesis wear daily for >90% of awake hours without skin or  limb pain issues to enable function throughout his day.     Time  3    Period  Months    Status  New    Target Date  11/27/18      PT LONG TERM GOAL #3   Title  Berg Balance >36/56 to indicate lower fall risk & less dependency in standing ADLs.     Time  3    Period  Months    Status  New    Target Date  11/27/18      PT LONG TERM GOAL #4   Title  Patient ambulates 500' with RW (or rollator) &  prosthesis outdoors on paved or grass up to 50' surfaces modified independent for community mobility.     Time  3    Period  Months    Status  New    Target Date  11/27/18      PT LONG TERM GOAL #5   Title  Patient negotiates ramps, curbs with walker & stairs 1 rail /cane modified independent for community access.     Time  3    Period  Months    Status  New    Target Date  11/27/18         Plan - 09/18/18 1334    Clinical Impression Statement  Todays skilled session focused on STS activity eliminitating BUE reliance, prosthetic gait with RW while decreasing BUE reliance and high level balance on compliant surfaces. Pt should benefit from continued PT sessions to progress towards goals.     Rehab Potential  Good    PT Frequency  2x / week    PT Duration  Other (comment)    PT Treatment/Interventions  ADLs/Self Care Home Management;Canalith Repostioning;DME Instruction;Gait training;Stair training;Functional mobility training;Therapeutic activities;Therapeutic exercise;Balance training;Neuromuscular re-education;Patient/family education;Prosthetic Training;Manual techniques;Vestibular    PT Next Visit Plan  donning/doffing, gait and high level balance while decreasing BUE reliance.     Consulted and Agree with Plan of Care  Patient;Family member/caregiver    Family Member Consulted  wife       Patient will benefit from skilled therapeutic intervention in order to improve the following deficits and impairments:  Abnormal gait, Decreased activity tolerance, Decreased balance, Decreased endurance, Decreased knowledge of use of DME, Decreased mobility, Impaired flexibility, Decreased range of motion, Decreased strength, Decreased skin integrity, Dizziness, Postural dysfunction, Prosthetic Dependency  Visit Diagnosis: Other abnormalities of gait and mobility  Abnormal posture  Muscle weakness (generalized)     Problem List Patient Active Problem List   Diagnosis Date Noted  .  Type 2 diabetes mellitus without complication ([place]) 48/54/6270  . Bradycardia 09/01/2018  . Gangrene of left foot ([place]) 05/28/2018  . 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 Obstructive sleep apnea syndrome 05/04/2018  . Obstructive sleep apnea treated with continuous positive airway pressure (CPAP) 05/04/2018  . Type II diabetes mellitus, uncontrolled ([place]) 04/22/2018  . Atherosclerosis of native arteries of the extremities with gangrene ([place]) 04/09/2018  . Atherosclerosis of native artery of extremity ([place]) 04/09/2018  . Cellulitis in diabetic foot ([place]) 04/04/2018  . Paroxysmal atrial fibrillation ([place]) 12/05/2017  . Cardiac pacemaker in situ   . Symptomatic bradycardia   . Asystole ([place])   . 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 History of aortic valve replacement 12/02/2017  . Severe aortic stenosis   . Bilateral impacted cerumen 01/22/2016  . Essential hypertension 11/14/2015  . Peripheral vascular disease ([place])  05/24/2014  . Chronic kidney disease 08/30/2013  . Hypothyroidism 08/30/2013  . 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 Diabetes mellitus type 2, uncomplicated ([place])   . Aortic valve stenosis 07/25/2011  . Hyperlipidemia 01/10/2011  . CAD (coronary artery disease) 01/10/2011  . Coronary arteriosclerosis 01/10/2011   Chassity Felts, PTA  Chassity A Felts 09/18/2018, 1:37 PM  [facility] [address] [place], [name], [address] Phone: [phone]   Fax:  [phone]  Name: DAL BLEW MRN: [id number] Date of Birth: 1941-03-16

## 2018-09-22 ENCOUNTER — Other Ambulatory Visit: Payer: Self-pay | Admitting: Physician Assistant

## 2018-09-22 ENCOUNTER — Ambulatory Visit: Payer: Medicare Other

## 2018-09-22 DIAGNOSIS — R2681 Unsteadiness on feet: Secondary | ICD-10-CM | POA: Diagnosis not present

## 2018-09-22 DIAGNOSIS — R293 Abnormal posture: Secondary | ICD-10-CM

## 2018-09-22 DIAGNOSIS — M6281 Muscle weakness (generalized): Secondary | ICD-10-CM | POA: Diagnosis not present

## 2018-09-22 DIAGNOSIS — R2689 Other abnormalities of gait and mobility: Secondary | ICD-10-CM

## 2018-09-22 DIAGNOSIS — Z9181 History of falling: Secondary | ICD-10-CM | POA: Diagnosis not present

## 2018-09-22 DIAGNOSIS — M6249 Contracture of muscle, multiple sites: Secondary | ICD-10-CM | POA: Diagnosis not present

## 2018-09-22 NOTE — Therapy (Signed)
[facility] [address] [place], [name], [address] Phone: [phone]   Fax:  [phone]  Physical Therapy Treatment  Patient Details  Name: Steven Maldonado MRN: [id number] Date of Birth: [birth year] Referring Provider (PT): Mechele Claude, [place]   Encounter Date: 09/22/2018  PT End of Session - 09/22/18 1323    Visit Number  7    Number of Visits  26    Date for PT Re-Evaluation  12/02/17    Authorization Type  Medicare & Federal BCBS    Authorization Time Period  Medicare guidelines, Fed BCBS waves co-pay.  75 visit limit PT, OT & ST combined with 1 used.    Authorization - Number of Visits  75    PT Start Time  1316    PT Stop Time  1400    PT Time Calculation (min)  44 min    Equipment Utilized During Treatment  Gait belt    Activity Tolerance  Patient tolerated treatment well    Behavior During Therapy  WFL for tasks assessed/performed       Past Medical History:  Diagnosis Date  . Chronic diastolic CHF (congestive heart failure) ([place])   . CKD (chronic kidney disease), stage III ([place])   . Constipation   . Coronary artery disease    a. Cath February 2012 in [facility], occluded RCA with collaterals  . DM type 2 (diabetes mellitus, type 2) ([place])   . Essential hypertension   . Hyperlipidemia   . Neuropathy    feet  . Pacemaker    a. symptomatic brady after TAVR s/p MDT PPM by Dr. Curt Bears 12/04/17  . Persistent atrial fibrillation   . PONV (postoperative nausea and vomiting)    after valve surgery  . PVD (peripheral vascular disease) ([place])    a. s/p R popliteal artery stenosis tx with drug-coated balloon 05/2014, followed by Dr. Fletcher Anon.  . 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 Severe aortic stenosis    a. 12/02/17: s/p TAVR  . Skin cancer   . Sleep apnea with use of continuous  positive airway pressure (CPAP)    04-11-11 AHI was 32.9 and titrated to 15 cm H20, DME is [facility]  . Subclinical hypothyroidism     Past Surgical History:  Procedure Laterality Date  . ABDOMINAL ANGIOGRAM N/A 06/08/2014   Procedure: ABDOMINAL ANGIOGRAM;  Surgeon: Wellington Hampshire, MD;  Location: [facility] CATH LAB;  Service: Cardiovascular;  Laterality: N/A;  . ABDOMINAL AORTOGRAM N/A 04/09/2018   Procedure: ABDOMINAL AORTOGRAM;  Surgeon: Conrad Dillon, MD;  Location: [facility] CV LAB;  Service: Cardiovascular;  Laterality: N/A;  . AMPUTATION Left 04/17/2018   Procedure: LEFT FOOT 3RD RAY AMPUTATION;  Surgeon: Newt Minion, MD;  Location: [facility];  Service: Orthopedics;  Laterality: Left;  . AMPUTATION Left 05/28/2018   Procedure: LEFT AMPUTATION BELOW KNEE;  Surgeon: Wylene Simmer, MD;  Location: [facility];  Service: Orthopedics;  Laterality: Left;  . APPENDECTOMY  1965  . 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 CARDIAC CATHETERIZATION  12/2010  . CARDIOVERSION  07/2011  . CARDIOVERSION N/A 04/18/2014   Procedure: CARDIOVERSION;  Surgeon: Dorothy Spark, MD;  Location: [facility] ENDOSCOPY;  Service: Cardiovascular;  Laterality: N/A;  . CARDIOVERSION N/A 11/03/2015   Procedure: CARDIOVERSION;  Surgeon: Lelon Perla, MD;  Location: [facility];  Service: Cardiovascular;  Laterality: N/A;  . CARDIOVERSION N/A 05/08/2017   Procedure: CARDIOVERSION;  Surgeon: Dorothy Spark, MD;  Location: [facility] ENDOSCOPY;  Service: Cardiovascular;  Laterality: N/A;  . CARDIOVERSION N/A 07/28/2017   Procedure: CARDIOVERSION;  Surgeon: Dorothy Spark, MD;  Location: [facility];  Service: Cardiovascular;  Laterality: N/A;  . LOWER EXTREMITY ANGIOGRAM N/A 06/08/2014   Procedure: LOWER EXTREMITY ANGIOGRAM;  Surgeon: Wellington Hampshire, MD;  Location: [facility] CATH LAB;  Service: Cardiovascular;  Laterality: N/A;  . LOWER EXTREMITY ANGIOGRAPHY Left 04/09/2018   Procedure: Lower Extremity Angiography;  Surgeon: Conrad Parsons, MD;   Location: [facility] CV LAB;  Service: Cardiovascular;  Laterality: Left;  . PACEMAKER IMPLANT N/A 12/04/2017   Procedure: PACEMAKER IMPLANT;  Surgeon: Constance Haw, MD;  Location: [facility] CV LAB;  Service: Cardiovascular;  Laterality: N/A;  . PERIPHERAL VASCULAR BALLOON ANGIOPLASTY Left 04/09/2018   Procedure: PERIPHERAL VASCULAR BALLOON ANGIOPLASTY;  Surgeon: Conrad Dixon, MD;  Location: [facility] CV LAB;  Service: Cardiovascular;  Laterality: Left;  SFA  . POPLITEAL ARTERY ANGIOPLASTY Right 06/08/2014   Archie Endo 06/08/2014  . RIGHT/LEFT HEART CATH AND CORONARY ANGIOGRAPHY N/A 10/08/2017   Procedure: RIGHT/LEFT HEART CATH AND CORONARY ANGIOGRAPHY;  Surgeon: Burnell Blanks, MD;  Location: [facility] CV LAB;  Service: Cardiovascular;  Laterality: N/A;  . SKIN CANCER EXCISION Bilateral    "have had them cut off back of neck X 2; off left upper arm; right wrist, near right shoulder blade" (06/08/2014)  . TEE WITHOUT CARDIOVERSION N/A 12/02/2017   Procedure: TRANSESOPHAGEAL ECHOCARDIOGRAM (TEE);  Surgeon: Burnell Blanks, MD;  Location: [facility];  Service: Open Heart Surgery;  Laterality: N/A;  . TEMPORARY PACEMAKER N/A 12/04/2017   Procedure: TEMPORARY PACEMAKER;  Surgeon: Leonie Man, MD;  Location: [facility] CV LAB;  Service: Cardiovascular;  Laterality: N/A;  . TRANSCATHETER AORTIC VALVE REPLACEMENT, TRANSFEMORAL N/A 12/02/2017   Procedure: TRANSCATHETER AORTIC VALVE REPLACEMENT, TRANSFEMORAL;  Surgeon: Burnell Blanks, MD;  Location: [facility];  Service: Open Heart Surgery;  Laterality: N/A;  using Edwards Sapien 3 Transcatheter Heart Valve size 30mm    There were no vitals filed for this visit.  Subjective Assessment - 09/22/18 1319    Subjective  No falls to report, Pt still wearing prosthesis 6hrs/2xday with no skin issues. Pt continues to practice donning prosthesis liner with min/moderate difficulty.    Patient is accompained by:  Family member     Pertinent History  L TTA, CAD, PAF,  pacemaker, DM2, neuropathy, CKD, HTN,     Limitations  Lifting;Standing;Walking;House hold activities    Patient Stated Goals  To use prosthesis in community, travel (uses [employer]), take of himself so wife does not have to do it. Housework.     Currently in Pain?  No/denies        [facility] Adult PT Treatment/Exercise - 09/22/18 1334      Ambulation/Gait   Ambulation/Gait  Yes    Ambulation/Gait Assistance  4: Min guard;4: Min assist    Ambulation/Gait Assistance Details  Verbal/Tactile cues for pacing and proper sequencing/technique with cane. No LOB noted during ambulation.     Ambulation Distance (Feet)  41 Feet   x2   Assistive device  Prosthesis;Large base quad cane    Gait Pattern  Step-through pattern;Decreased step length - right;Decreased stance time - left;Decreased stride length;Decreased hip/knee flexion - left;Decreased weight shift to left;Left hip hike;Left circumduction;Antalgic;Lateral hip instability;Trunk flexed;Abducted - left    Ambulation Surface  Level;Indoor  Stairs  Yes    Stairs Assistance  5: Supervision    Stairs Assistance Details (indicate cue type and reason)  VC's for foot clearance and sequence.    Stair Management Technique  Two rails;Step to pattern;Forwards;Other (comment)   with prosthesis   Number of Stairs  4    Height of Stairs  6    Ramp  5: Supervision    Ramp Details (indicate cue type and reason)  With RW, VC's for weight shift and pacing while descending ramp.     Curb  5: Supervision    Curb Details (indicate cue type and reason)  With RW, VC's for distance from curb prior to ascending/descending.       Neuro Re-ed    Neuro Re-ed Details   Pt standing on airex with no UE support, min guard/assist performing static standing progressing to head nods with no LOB and min/moderate sway.           PT Short Term Goals - 09/02/18 1900      PT SHORT TERM GOAL #1   Title  Patient donnes prosthesis with  moderate assist for liner & minimal assist for prosthesis. (All STGs Target Date: 10/02/2018)    Time  1    Period  Months    Status  New    Target Date  10/02/18      PT SHORT TERM GOAL #2   Title  Patient tolerates prosthesis wear >12hrs daily without skin issues.     Time  1    Period  Months    Status  New    Target Date  10/02/18      PT SHORT TERM GOAL #3   Title  Patient performs standing balance with RW & prosthesis reaching 10", to floor and managing pants for toileting.     Time  1    Period  Months    Status  New    Target Date  10/02/18      PT SHORT TERM GOAL #4   Title  Patient ambulates 200' with RW & prosthesis with supervision.     Time  1    Period  Months    Status  New    Target Date  10/02/18      PT SHORT TERM GOAL #5   Title  Patient negotiates ramps & curbs with RW and stairs with 2 rails with minimal assist.     Time  1    Period  Months    Status  New    Target Date  10/02/18        PT Long Term Goals - 09/02/18 1800      PT LONG TERM GOAL #1   Title  Patient verbalizes & demonstrates proper prosthetic care including donning / doffing independently to enable safe use of prosthesis (All LTGs Target Date: 11/27/2018)    Time  3    Period  Months    Status  New    Target Date  11/27/18      PT LONG TERM GOAL #2   Title  Patient tolerates prosthesis wear daily for >90% of awake hours without skin or limb pain issues to enable function throughout his day.     Time  3    Period  Months    Status  New    Target Date  11/27/18      PT LONG TERM GOAL #3   Title  Berg Balance >36/56 to indicate lower  fall risk & less dependency in standing ADLs.     Time  3    Period  Months    Status  New    Target Date  11/27/18      PT LONG TERM GOAL #4   Title  Patient ambulates 500' with RW (or rollator) & prosthesis outdoors on paved or grass up to 50' surfaces modified independent for community mobility.     Time  3    Period  Months    Status  New     Target Date  11/27/18      PT LONG TERM GOAL #5   Title  Patient negotiates ramps, curbs with walker & stairs 1 rail /cane modified independent for community access.     Time  3    Period  Months    Status  New    Target Date  11/27/18            Plan - 09/22/18 1847    Clinical Impression Statement  Todays skilled session focused on introducing gait with LBQC/Prosthesis ambulating short distances with emphasis on sequence with AD, curb/ramp training with RW, stair training with BUE support on rails and high level balance on compliant surface. Pt is making steady progress and should benefit from PT sessions to continue to progress towards goals.     Rehab Potential  Good    PT Frequency  2x / week    PT Duration  Other (comment)    PT Treatment/Interventions  ADLs/Self Care Home Management;Canalith Repostioning;DME Instruction;Gait training;Stair training;Functional mobility training;Therapeutic activities;Therapeutic exercise;Balance training;Neuromuscular re-education;Patient/family education;Prosthetic Training;Manual techniques;Vestibular    PT Next Visit Plan  donning/doffing, gait with LBQC and high level balance on compliant surfaces.     Consulted and Agree with Plan of Care  Patient;Family member/caregiver    Family Member Consulted  wife       Patient will benefit from skilled therapeutic intervention in order to improve the following deficits and impairments:  Abnormal gait, Decreased activity tolerance, Decreased balance, Decreased endurance, Decreased knowledge of use of DME, Decreased mobility, Impaired flexibility, Decreased range of motion, Decreased strength, Decreased skin integrity, Dizziness, Postural dysfunction, Prosthetic Dependency  Visit Diagnosis: Other abnormalities of gait and mobility  Abnormal posture  Muscle weakness (generalized)     Problem List Patient Active Problem List   Diagnosis Date Noted  . Type 2 diabetes mellitus without  complication ([place]) 54/62/7035  . Bradycardia 09/01/2018  . Gangrene of left foot ([place]) 05/28/2018  . 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 Obstructive sleep apnea syndrome 05/04/2018  . Obstructive sleep apnea treated with continuous positive airway pressure (CPAP) 05/04/2018  . Type II diabetes mellitus, uncontrolled ([place]) 04/22/2018  . Atherosclerosis of native arteries of the extremities with gangrene ([place]) 04/09/2018  . Atherosclerosis of native artery of extremity ([place]) 04/09/2018  . Cellulitis in diabetic foot ([place]) 04/04/2018  . Paroxysmal atrial fibrillation ([place]) 12/05/2017  . Cardiac pacemaker in situ   . Symptomatic bradycardia   . Asystole ([place])   . 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 History of aortic valve replacement 12/02/2017  . Severe aortic stenosis   . Bilateral impacted cerumen 01/22/2016  . Essential hypertension 11/14/2015  . Peripheral vascular disease ([place]) 05/24/2014  . Chronic kidney disease 08/30/2013  . Hypothyroidism 08/30/2013  . 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 Diabetes mellitus type 2, uncomplicated ([place])   . Aortic  valve stenosis 07/25/2011  . Hyperlipidemia 01/10/2011  . CAD (coronary artery disease) 01/10/2011  . Coronary arteriosclerosis 01/10/2011    , PTA   A  09/22/2018, 6:52 PM  [facility] [address] [place], [name], [address] Phone: [phone]   Fax:  [phone]  Name: Steven Maldonado MRN: [id number] Date of Birth: Feb 22, 1941

## 2018-09-25 ENCOUNTER — Ambulatory Visit: Payer: Medicare Other

## 2018-09-25 DIAGNOSIS — R293 Abnormal posture: Secondary | ICD-10-CM | POA: Diagnosis not present

## 2018-09-25 DIAGNOSIS — M6281 Muscle weakness (generalized): Secondary | ICD-10-CM

## 2018-09-25 DIAGNOSIS — Z9181 History of falling: Secondary | ICD-10-CM | POA: Diagnosis not present

## 2018-09-25 DIAGNOSIS — R2689 Other abnormalities of gait and mobility: Secondary | ICD-10-CM

## 2018-09-25 DIAGNOSIS — M6249 Contracture of muscle, multiple sites: Secondary | ICD-10-CM | POA: Diagnosis not present

## 2018-09-25 DIAGNOSIS — R2681 Unsteadiness on feet: Secondary | ICD-10-CM | POA: Diagnosis not present

## 2018-09-25 NOTE — Patient Instructions (Signed)
Knee Extension (Active / Resistive ROM)    Extend knee so that lower leg is straight, lower slowly. Repeat with other leg. Repeat __10__ times. Do __2__ sessions per day.  http://gt2.exer.us/617   Copyright  VHI. All rights reserved.   High Stepping in Place (Sitting)    Sitting, alternately lift knees as high as possible. Keep torso erect. Repeat __10__ times, each leg.  Copyright  VHI. All rights reserved.    Isometric Hip Abduction    Place tubing around thighs. Spread legs apart, tightening muscles on outside of thigh. Repeat __10__ times. Do __2__ sessions per day.  http://gt2.exer.us/627   Copyright  VHI. All rights reserved.   Adduction: Hip - Knees Together With Pelvic Floor (Sitting)    Sit with towel roll or pillow between knees. Squeeze pelvic floor while pushing knees together.  Repeat __10_ times. Do _2__ times a day.  Copyright  VHI. All rights reserved.

## 2018-09-26 NOTE — Therapy (Signed)
[facility] [address] [place], [name], [address] Phone: [phone]   Fax:  [phone]  Physical Therapy Treatment  Patient Details  Name: Steven Maldonado MRN: [id number] Date of Birth: 07-21-1941 Referring Provider (PT): Mechele Claude, [place]   Encounter Date: 09/25/2018  PT End of Session - 09/25/18 1327    Visit Number  8    Number of Visits  26    Date for PT Re-Evaluation  12/02/17    Authorization Type  Medicare & Federal BCBS    Authorization Time Period  Medicare guidelines, Fed BCBS waves co-pay.  75 visit limit PT, OT & ST combined with 1 used.    Authorization - Number of Visits  75    PT Start Time  7588    PT Stop Time  1405    PT Time Calculation (min)  50 min    Equipment Utilized During Treatment  Gait belt    Activity Tolerance  Patient tolerated treatment well    Behavior During Therapy  WFL for tasks assessed/performed       Past Medical History:  Diagnosis Date  . Chronic diastolic CHF (congestive heart failure) ([place])   . CKD (chronic kidney disease), stage III ([place])   . Constipation   . Coronary artery disease    a. Cath February 2012 in [facility], occluded RCA with collaterals  . DM type 2 (diabetes mellitus, type 2) ([place])   . Essential hypertension   . Hyperlipidemia   . Neuropathy    feet  . Pacemaker    a. symptomatic brady after TAVR s/p MDT PPM by Dr. Curt Bears 12/04/17  . Persistent atrial fibrillation   . PONV (postoperative nausea and vomiting)    after valve surgery  . PVD (peripheral vascular disease) ([place])    a. s/p R popliteal artery stenosis tx with drug-coated balloon 05/2014, followed by Dr. Fletcher Anon.  . 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 Severe aortic stenosis    a. 12/02/17: s/p TAVR  . Skin cancer   . Sleep apnea with use of continuous  positive airway pressure (CPAP)    04-11-11 AHI was 32.9 and titrated to 15 cm H20, DME is [facility]  . Subclinical hypothyroidism     Past Surgical History:  Procedure Laterality Date  . ABDOMINAL ANGIOGRAM N/A 06/08/2014   Procedure: ABDOMINAL ANGIOGRAM;  Surgeon: Wellington Hampshire, MD;  Location: [facility] CATH LAB;  Service: Cardiovascular;  Laterality: N/A;  . ABDOMINAL AORTOGRAM N/A 04/09/2018   Procedure: ABDOMINAL AORTOGRAM;  Surgeon: Conrad Blue Ash, MD;  Location: [facility] CV LAB;  Service: Cardiovascular;  Laterality: N/A;  . AMPUTATION Left 04/17/2018   Procedure: LEFT FOOT 3RD RAY AMPUTATION;  Surgeon: Newt Minion, MD;  Location: [facility];  Service: Orthopedics;  Laterality: Left;  . AMPUTATION Left 05/28/2018   Procedure: LEFT AMPUTATION BELOW KNEE;  Surgeon: Wylene Simmer, MD;  Location: [facility];  Service: Orthopedics;  Laterality: Left;  . APPENDECTOMY  1965  . 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 CARDIAC CATHETERIZATION  12/2010  . CARDIOVERSION  07/2011  . CARDIOVERSION N/A 04/18/2014   Procedure: CARDIOVERSION;  Surgeon: Dorothy Spark, MD;  Location: [facility] ENDOSCOPY;  Service: Cardiovascular;  Laterality: N/A;  . CARDIOVERSION N/A 11/03/2015   Procedure: CARDIOVERSION;  Surgeon: Lelon Perla, MD;  Location: [facility];  Service: Cardiovascular;  Laterality: N/A;  . CARDIOVERSION N/A 05/08/2017   Procedure: CARDIOVERSION;  Surgeon: Dorothy Spark, MD;  Location: [facility] ENDOSCOPY;  Service: Cardiovascular;  Laterality: N/A;  . CARDIOVERSION N/A 07/28/2017   Procedure: CARDIOVERSION;  Surgeon: Dorothy Spark, MD;  Location: [facility];  Service: Cardiovascular;  Laterality: N/A;  . LOWER EXTREMITY ANGIOGRAM N/A 06/08/2014   Procedure: LOWER EXTREMITY ANGIOGRAM;  Surgeon: Wellington Hampshire, MD;  Location: [facility] CATH LAB;  Service: Cardiovascular;  Laterality: N/A;  . LOWER EXTREMITY ANGIOGRAPHY Left 04/09/2018   Procedure: Lower Extremity Angiography;  Surgeon: Conrad [place], MD;   Location: [facility] CV LAB;  Service: Cardiovascular;  Laterality: Left;  . PACEMAKER IMPLANT N/A 12/04/2017   Procedure: PACEMAKER IMPLANT;  Surgeon: Constance Haw, MD;  Location: [facility] CV LAB;  Service: Cardiovascular;  Laterality: N/A;  . PERIPHERAL VASCULAR BALLOON ANGIOPLASTY Left 04/09/2018   Procedure: PERIPHERAL VASCULAR BALLOON ANGIOPLASTY;  Surgeon: Conrad Advance, MD;  Location: [facility] CV LAB;  Service: Cardiovascular;  Laterality: Left;  SFA  . POPLITEAL ARTERY ANGIOPLASTY Right 06/08/2014   Archie Endo 06/08/2014  . RIGHT/LEFT HEART CATH AND CORONARY ANGIOGRAPHY N/A 10/08/2017   Procedure: RIGHT/LEFT HEART CATH AND CORONARY ANGIOGRAPHY;  Surgeon: Burnell Blanks, MD;  Location: [facility] CV LAB;  Service: Cardiovascular;  Laterality: N/A;  . SKIN CANCER EXCISION Bilateral    "have had them cut off back of neck X 2; off left upper arm; right wrist, near right shoulder blade" (06/08/2014)  . TEE WITHOUT CARDIOVERSION N/A 12/02/2017   Procedure: TRANSESOPHAGEAL ECHOCARDIOGRAM (TEE);  Surgeon: Burnell Blanks, MD;  Location: [facility];  Service: Open Heart Surgery;  Laterality: N/A;  . TEMPORARY PACEMAKER N/A 12/04/2017   Procedure: TEMPORARY PACEMAKER;  Surgeon: Leonie Man, MD;  Location: [facility] CV LAB;  Service: Cardiovascular;  Laterality: N/A;  . TRANSCATHETER AORTIC VALVE REPLACEMENT, TRANSFEMORAL N/A 12/02/2017   Procedure: TRANSCATHETER AORTIC VALVE REPLACEMENT, TRANSFEMORAL;  Surgeon: Burnell Blanks, MD;  Location: [facility];  Service: Open Heart Surgery;  Laterality: N/A;  using Edwards Sapien 3 Transcatheter Heart Valve size 1mm    There were no vitals filed for this visit.  Subjective Assessment - 09/25/18 1324    Subjective  No falls to report, HEP is going well. Pt continues to wear prosthesis 6hrs/2xday with no skin issues.     Patient is accompained by:  Family member    Pertinent History  L TTA, CAD, PAF,  pacemaker, DM2,  neuropathy, CKD, HTN,     Limitations  Lifting;Standing;Walking;House hold activities    Patient Stated Goals  To use prosthesis in community, travel (uses [employer]i), take of himself so wife does not have to do it. Housework.     Currently in Pain?  Yes    Pain Score  4     Pain Location  Leg    Pain Orientation  Left    Pain Descriptors / Indicators  Pressure    Pain Onset  1 to 4 weeks ago    Pain Frequency  Intermittent    Aggravating Factors   Ambulation, weight bearing    Pain Relieving Factors  Off weighting limb         [facility] Adult PT Treatment/Exercise - 09/26/18 1032      Ambulation/Gait   Ambulation/Gait  Yes    Ambulation/Gait Assistance  4: Min guard    Ambulation Distance (Feet)  70 Feet   45   Assistive device  Prosthesis;Large base quad cane  Gait Pattern  Step-through pattern;Decreased step length - right;Decreased stance time - left;Decreased stride length;Decreased hip/knee flexion - left;Decreased weight shift to left;Left hip hike;Left circumduction;Antalgic;Lateral hip instability;Trunk flexed;Abducted - left      Exercises   Exercises  Knee/Hip      Knee/Hip Exercises: Seated   Long Arc Quad  AROM;Strengthening;Both;2 sets;10 reps    Long Arc Quad Weight  1 lbs.    Marching  AROM;Strengthening;Both;2 sets;10 reps;Weights    Marching Weights  1 lbs.    Abduction/Adduction   AROM;Strengthening;Both;2 sets;10 reps;Other (comment)   GTB        PT Education - 09/26/18 1034    Education Details  BLE strengthening HEP initiated.     Person(s) Educated  Patient    Methods  Explanation;Demonstration;Verbal cues;Handout    Comprehension  Verbalized understanding;Returned demonstration;Need further instruction;Verbal cues required       PT Short Term Goals - 09/02/18 1900      PT SHORT TERM GOAL #1   Title  Patient donnes prosthesis with moderate assist for liner & minimal assist for prosthesis. (All STGs Target Date: 10/02/2018)    Time  1    Period   Months    Status  New    Target Date  10/02/18      PT SHORT TERM GOAL #2   Title  Patient tolerates prosthesis wear >12hrs daily without skin issues.     Time  1    Period  Months    Status  New    Target Date  10/02/18      PT SHORT TERM GOAL #3   Title  Patient performs standing balance with RW & prosthesis reaching 10", to floor and managing pants for toileting.     Time  1    Period  Months    Status  New    Target Date  10/02/18      PT SHORT TERM GOAL #4   Title  Patient ambulates 200' with RW & prosthesis with supervision.     Time  1    Period  Months    Status  New    Target Date  10/02/18      PT SHORT TERM GOAL #5   Title  Patient negotiates ramps & curbs with RW and stairs with 2 rails with minimal assist.     Time  1    Period  Months    Status  New    Target Date  10/02/18        PT Long Term Goals - 09/02/18 1800      PT LONG TERM GOAL #1   Title  Patient verbalizes & demonstrates proper prosthetic care including donning / doffing independently to enable safe use of prosthesis (All LTGs Target Date: 11/27/2018)    Time  3    Period  Months    Status  New    Target Date  11/27/18      PT LONG TERM GOAL #2   Title  Patient tolerates prosthesis wear daily for >90% of awake hours without skin or limb pain issues to enable function throughout his day.     Time  3    Period  Months    Status  New    Target Date  11/27/18      PT LONG TERM GOAL #3   Title  Berg Balance >36/56 to indicate lower fall risk & less dependency in standing ADLs.     Time  3    Period  Months    Status  New    Target Date  11/27/18      PT LONG TERM GOAL #4   Title  Patient ambulates 500' with RW (or rollator) & prosthesis outdoors on paved or grass up to 50' surfaces modified independent for community mobility.     Time  3    Period  Months    Status  New    Target Date  11/27/18      PT LONG TERM GOAL #5   Title  Patient negotiates ramps, curbs with walker & stairs  1 rail /cane modified independent for community access.     Time  3    Period  Months    Status  New    Target Date  11/27/18            Plan - 09/25/18 1500    Clinical Impression Statement  Todays skilled session focused on gait training with SBQC/prosthesis with a small increase in distance/endurance with min guard required for assistance, as well as BLE strengthening to initiate HEP with demo and VC's required. Pt continues to make steady progress and should benefit from PT session to progress towards unmet goals.     Rehab Potential  Good    PT Frequency  2x / week    PT Duration  Other (comment)    PT Treatment/Interventions  ADLs/Self Care Home Management;Canalith Repostioning;DME Instruction;Gait training;Stair training;Functional mobility training;Therapeutic activities;Therapeutic exercise;Balance training;Neuromuscular re-education;Patient/family education;Prosthetic Training;Manual techniques;Vestibular    PT Next Visit Plan  donning/doffing, gait with LBQC and high level balance on compliant surfaces.     Consulted and Agree with Plan of Care  Patient;Family member/caregiver    Family Member Consulted  wife       Patient will benefit from skilled therapeutic intervention in order to improve the following deficits and impairments:  Abnormal gait, Decreased activity tolerance, Decreased balance, Decreased endurance, Decreased knowledge of use of DME, Decreased mobility, Impaired flexibility, Decreased range of motion, Decreased strength, Decreased skin integrity, Dizziness, Postural dysfunction, Prosthetic Dependency  Visit Diagnosis: Other abnormalities of gait and mobility  Abnormal posture  Muscle weakness (generalized)     Problem List Patient Active Problem List   Diagnosis Date Noted  . Type 2 diabetes mellitus without complication ([place]) 32/95/1884  . Bradycardia 09/01/2018  . Gangrene of left foot ([place]) 05/28/2018  . 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 Obstructive sleep apnea syndrome 05/04/2018  . Obstructive sleep apnea treated with continuous positive airway pressure (CPAP) 05/04/2018  . Type II diabetes mellitus, uncontrolled ([place]) 04/22/2018  . Atherosclerosis of native arteries of the extremities with gangrene ([place]) 04/09/2018  . Atherosclerosis of native artery of extremity ([place]) 04/09/2018  . Cellulitis in diabetic foot ([place]) 04/04/2018  . Paroxysmal atrial fibrillation ([place]) 12/05/2017  . Cardiac pacemaker in situ   . Symptomatic bradycardia   . Asystole ([place])   . 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 History of aortic valve replacement 12/02/2017  . Severe aortic stenosis   . Bilateral impacted cerumen 01/22/2016  . Essential hypertension 11/14/2015  . Peripheral vascular disease ([place]) 05/24/2014  . Chronic kidney disease 08/30/2013  . Hypothyroidism 08/30/2013  . 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 Diabetes mellitus type 2, uncomplicated ([place])   . Aortic valve stenosis 07/25/2011  . Hyperlipidemia 01/10/2011  . CAD (coronary artery disease) 01/10/2011  .  Coronary arteriosclerosis 01/10/2011   Chassity Felts, PTA  Chassity A Felts 09/26/2018, 10:41 AM  [facility] [address] [place], [name], [address] Phone: [phone]   Fax:  [phone]  Name: DWAIN HUHN MRN: [id number] Date of Birth: 1940-11-24

## 2018-09-28 ENCOUNTER — Ambulatory Visit (INDEPENDENT_AMBULATORY_CARE_PROVIDER_SITE_OTHER): Payer: Medicare Other

## 2018-09-28 ENCOUNTER — Telehealth: Payer: Self-pay

## 2018-09-28 DIAGNOSIS — I442 Atrioventricular block, complete: Secondary | ICD-10-CM | POA: Diagnosis not present

## 2018-09-28 NOTE — Telephone Encounter (Signed)
Spoke with pt and reminded pt of remote transmission that is due today. Pt verbalized understanding.   

## 2018-09-29 ENCOUNTER — Encounter: Payer: Self-pay | Admitting: Physician Assistant

## 2018-09-29 ENCOUNTER — Ambulatory Visit: Payer: Medicare Other | Admitting: Registered"

## 2018-09-29 ENCOUNTER — Ambulatory Visit: Payer: Medicare Other

## 2018-09-29 DIAGNOSIS — M6249 Contracture of muscle, multiple sites: Secondary | ICD-10-CM | POA: Diagnosis not present

## 2018-09-29 DIAGNOSIS — R293 Abnormal posture: Secondary | ICD-10-CM | POA: Diagnosis not present

## 2018-09-29 DIAGNOSIS — R2681 Unsteadiness on feet: Secondary | ICD-10-CM | POA: Diagnosis not present

## 2018-09-29 DIAGNOSIS — R2689 Other abnormalities of gait and mobility: Secondary | ICD-10-CM | POA: Diagnosis not present

## 2018-09-29 DIAGNOSIS — Z9181 History of falling: Secondary | ICD-10-CM | POA: Diagnosis not present

## 2018-09-29 DIAGNOSIS — M6281 Muscle weakness (generalized): Secondary | ICD-10-CM

## 2018-09-29 NOTE — Therapy (Signed)
[facility] [address] [place], [name], [address] Phone: [phone]   Fax:  [phone]  Physical Therapy Treatment  Patient Details  Name: Steven Maldonado MRN: [id number] Date of Birth: 04/30/1941 Referring Provider (PT): Mechele Claude, [place]   Encounter Date: 09/29/2018  PT End of Session - 09/29/18 1320    Visit Number  9    Number of Visits  26    Date for PT Re-Evaluation  12/02/17    Authorization Type  Medicare & Federal BCBS    Authorization Time Period  Medicare guidelines, Fed BCBS waves co-pay.  75 visit limit PT, OT & ST combined with 1 used.    Authorization - Number of Visits  75    PT Start Time  1219    PT Stop Time  1400    PT Time Calculation (min)  45 min    Equipment Utilized During Treatment  Gait belt    Activity Tolerance  Patient tolerated treatment well    Behavior During Therapy  WFL for tasks assessed/performed       Past Medical History:  Diagnosis Date  . Chronic diastolic CHF (congestive heart failure) ([place])   . CKD (chronic kidney disease), stage III ([place])   . Constipation   . Coronary artery disease    a. Cath February 2012 in [facility], occluded RCA with collaterals  . DM type 2 (diabetes mellitus, type 2) ([place])   . Essential hypertension   . Hyperlipidemia   . Neuropathy    feet  . Pacemaker    a. symptomatic brady after TAVR s/p MDT PPM by Dr. Curt Bears 12/04/17  . Persistent atrial fibrillation   . PONV (postoperative nausea and vomiting)    after valve surgery  . PVD (peripheral vascular disease) ([place])    a. s/p R popliteal artery stenosis tx with drug-coated balloon 05/2014, followed by Dr. Fletcher Anon.  . 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 Severe aortic stenosis    a. 12/02/17: s/p TAVR  . Skin cancer   . Sleep apnea with use of continuous  positive airway pressure (CPAP)    04-11-11 AHI was 32.9 and titrated to 15 cm H20, DME is [facility]  . Subclinical hypothyroidism     Past Surgical History:  Procedure Laterality Date  . ABDOMINAL ANGIOGRAM N/A 06/08/2014   Procedure: ABDOMINAL ANGIOGRAM;  Surgeon: Wellington Hampshire, MD;  Location: [facility] CATH LAB;  Service: Cardiovascular;  Laterality: N/A;  . ABDOMINAL AORTOGRAM N/A 04/09/2018   Procedure: ABDOMINAL AORTOGRAM;  Surgeon: Conrad Windsor, MD;  Location: [facility] CV LAB;  Service: Cardiovascular;  Laterality: N/A;  . AMPUTATION Left 04/17/2018   Procedure: LEFT FOOT 3RD RAY AMPUTATION;  Surgeon: Newt Minion, MD;  Location: [facility];  Service: Orthopedics;  Laterality: Left;  . AMPUTATION Left 05/28/2018   Procedure: LEFT AMPUTATION BELOW KNEE;  Surgeon: Wylene Simmer, MD;  Location: [facility];  Service: Orthopedics;  Laterality: Left;  . APPENDECTOMY  1965  . 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 CARDIAC CATHETERIZATION  12/2010  . CARDIOVERSION  07/2011  . CARDIOVERSION N/A 04/18/2014   Procedure: CARDIOVERSION;  Surgeon: Dorothy Spark, MD;  Location: [facility] ENDOSCOPY;  Service: Cardiovascular;  Laterality: N/A;  . CARDIOVERSION N/A 11/03/2015   Procedure: CARDIOVERSION;  Surgeon: Lelon Perla, MD;  Location: [facility];  Service: Cardiovascular;  Laterality: N/A;  . CARDIOVERSION N/A 05/08/2017   Procedure: CARDIOVERSION;  Surgeon: Dorothy Spark, MD;  Location: [facility] ENDOSCOPY;  Service: Cardiovascular;  Laterality: N/A;  . CARDIOVERSION N/A 07/28/2017   Procedure: CARDIOVERSION;  Surgeon: Dorothy Spark, MD;  Location: [facility];  Service: Cardiovascular;  Laterality: N/A;  . LOWER EXTREMITY ANGIOGRAM N/A 06/08/2014   Procedure: LOWER EXTREMITY ANGIOGRAM;  Surgeon: Wellington Hampshire, MD;  Location: [facility] CATH LAB;  Service: Cardiovascular;  Laterality: N/A;  . LOWER EXTREMITY ANGIOGRAPHY Left 04/09/2018   Procedure: Lower Extremity Angiography;  Surgeon: Conrad Kenesaw, MD;   Location: [facility] CV LAB;  Service: Cardiovascular;  Laterality: Left;  . PACEMAKER IMPLANT N/A 12/04/2017   Procedure: PACEMAKER IMPLANT;  Surgeon: Constance Haw, MD;  Location: [facility] CV LAB;  Service: Cardiovascular;  Laterality: N/A;  . PERIPHERAL VASCULAR BALLOON ANGIOPLASTY Left 04/09/2018   Procedure: PERIPHERAL VASCULAR BALLOON ANGIOPLASTY;  Surgeon: Conrad Camuy, MD;  Location: [facility] CV LAB;  Service: Cardiovascular;  Laterality: Left;  SFA  . POPLITEAL ARTERY ANGIOPLASTY Right 06/08/2014   Archie Endo 06/08/2014  . RIGHT/LEFT HEART CATH AND CORONARY ANGIOGRAPHY N/A 10/08/2017   Procedure: RIGHT/LEFT HEART CATH AND CORONARY ANGIOGRAPHY;  Surgeon: Burnell Blanks, MD;  Location: [facility] CV LAB;  Service: Cardiovascular;  Laterality: N/A;  . SKIN CANCER EXCISION Bilateral    "have had them cut off back of neck X 2; off left upper arm; right wrist, near right shoulder blade" (06/08/2014)  . TEE WITHOUT CARDIOVERSION N/A 12/02/2017   Procedure: TRANSESOPHAGEAL ECHOCARDIOGRAM (TEE);  Surgeon: Burnell Blanks, MD;  Location: [place] [facility];  Service: Open Heart Surgery;  Laterality: N/A;  . TEMPORARY PACEMAKER N/A 12/04/2017   Procedure: TEMPORARY PACEMAKER;  Surgeon: Leonie Man, MD;  Location: [facility] CV LAB;  Service: Cardiovascular;  Laterality: N/A;  . TRANSCATHETER AORTIC VALVE REPLACEMENT, TRANSFEMORAL N/A 12/02/2017   Procedure: TRANSCATHETER AORTIC VALVE REPLACEMENT, TRANSFEMORAL;  Surgeon: Burnell Blanks, MD;  Location: [facility];  Service: Open Heart Surgery;  Laterality: N/A;  using Edwards Sapien 3 Transcatheter Heart Valve size 16m    There were no vitals filed for this visit.  Subjective Assessment - 09/29/18 1324    Subjective  No falls to report, HEP is going well. Pt presents with some redness and sensitivity on posterior knee due to pressure from prosthesis.     Patient is accompained by:  Family member    Pertinent History  L TTA,  CAD, PAF,  pacemaker, DM2, neuropathy, CKD, HTN,     Limitations  Lifting;Standing;Walking;House hold activities    Patient Stated Goals  To use prosthesis in community, travel (uses v[place]ianne Lei, take of himself so wife does not have to do it. Housework.     Currently in Pain?  Yes    Pain Score  6     Pain Location  Leg    Pain Orientation  Left    Pain Descriptors / Indicators  Tender    Pain Type  Acute pain    Pain Onset  In the past 7 days    Aggravating Factors   wearing prosthesis, weight bearing    Pain Relieving Factors  taking off prosthesis        [facility] Adult PT Treatment/Exercise - 09/29/18 1359      Ambulation/Gait   Ambulation/Gait  Yes    Ambulation/Gait Assistance  5: Supervision;4: Min assist    Ambulation/Gait Assistance Details  Pt requested use of rollator walker with min  assist required, VC's for proper use of AD, pacing, posture and step length.     Ambulation Distance (Feet)  115 Feet   x2   Assistive device  Prosthesis;Rolling walker;Rollator    Gait Pattern  Step-through pattern;Decreased step length - right;Decreased stance time - left;Decreased stride length;Decreased hip/knee flexion - left;Decreased weight shift to left;Left hip hike;Left circumduction;Antalgic;Lateral hip instability;Trunk flexed;Abducted - left    Ambulation Surface  Level;Indoor       PT Education - 09/29/18 2022    Education Details  Educated pt on proper donning of prosthesis liner to decrease assistance level required and skin irritation.     Person(s) Educated  Patient;Spouse    Methods  Explanation;Demonstration;Tactile cues;Verbal cues    Comprehension  Verbalized understanding;Returned demonstration;Verbal cues required;Tactile cues required;Need further instruction       PT Short Term Goals - 09/29/18 1341      PT SHORT TERM GOAL #1   Title  Patient donnes prosthesis with moderate assist for liner & minimal assist for prosthesis. (All STGs Target Date: 10/02/2018)    Baseline   11/19: Pt donnes liner with mod assist/prosthesis with min assist.     Time  1    Period  Months    Status  Achieved      PT SHORT TERM GOAL #2   Title  Patient tolerates prosthesis wear >12hrs daily without skin issues.     Baseline  11/19: Pt tolerates 10hrs daily without skin issues.     Time  1    Period  Months    Status  Partially Met      PT SHORT TERM GOAL #3   Title  Patient performs standing balance with RW & prosthesis reaching 10", to floor and managing pants for toileting.     Time  1    Period  Months    Status  New      PT SHORT TERM GOAL #4   Title  Patient ambulates 200' with RW & prosthesis with supervision.     Baseline  11/08: Pt ambulates 200' with RW, Supervision    Time  1    Period  Months    Status  Achieved      PT SHORT TERM GOAL #5   Title  Patient negotiates ramps & curbs with RW and stairs with 2 rails with minimal assist.     Baseline  11/12: Pt negotiates ramps, curbs with RW, stairs with 2 rails with Supervision.     Time  1    Period  Months    Status  Achieved        PT Long Term Goals - 09/02/18 1800      PT LONG TERM GOAL #1   Title  Patient verbalizes & demonstrates proper prosthetic care including donning / doffing independently to enable safe use of prosthesis (All LTGs Target Date: 11/27/2018)    Time  3    Period  Months    Status  New    Target Date  11/27/18      PT LONG TERM GOAL #2   Title  Patient tolerates prosthesis wear daily for >90% of awake hours without skin or limb pain issues to enable function throughout his day.     Time  3    Period  Months    Status  New    Target Date  11/27/18      PT LONG TERM GOAL #3   Title  Berg Balance >36/56  to indicate lower fall risk & less dependency in standing ADLs.     Time  3    Period  Months    Status  New    Target Date  11/27/18      PT LONG TERM GOAL #4   Title  Patient ambulates 500' with RW (or rollator) & prosthesis outdoors on paved or grass up to 50' surfaces  modified independent for community mobility.     Time  3    Period  Months    Status  New    Target Date  11/27/18      PT LONG TERM GOAL #5   Title  Patient negotiates ramps, curbs with walker & stairs 1 rail /cane modified independent for community access.     Time  3    Period  Months    Status  New    Target Date  11/27/18       Plan - 09/29/18 2032    Clinical Impression Statement  Todays skilled session focused on gait training with RW/Rollator walker, education on proper donning of prosthesis liner to decrease assistance level required, and assessing STG's with 3/5 achieved, 1/5 partially met and 1 ongoing. Pt stated he had a decrease in pain in residual limb weightbearing in prosthesis after proper donning of liner. Pt continues to make steady progress and should benefit from PT sessions to progress towards goals.     Rehab Potential  Good    PT Frequency  2x / week    PT Duration  Other (comment)    PT Treatment/Interventions  ADLs/Self Care Home Management;Canalith Repostioning;DME Instruction;Gait training;Stair training;Functional mobility training;Therapeutic activities;Therapeutic exercise;Balance training;Neuromuscular re-education;Patient/family education;Prosthetic Training;Manual techniques;Vestibular    PT Next Visit Plan  Assess remaining STG, donning/doffing, gait with LBQC and high level balance on compliant surfaces.     Consulted and Agree with Plan of Care  Patient;Family member/caregiver    Family Member Consulted  wife       Patient will benefit from skilled therapeutic intervention in order to improve the following deficits and impairments:  Abnormal gait, Decreased activity tolerance, Decreased balance, Decreased endurance, Decreased knowledge of use of DME, Decreased mobility, Impaired flexibility, Decreased range of motion, Decreased strength, Decreased skin integrity, Dizziness, Postural dysfunction, Prosthetic Dependency  Visit Diagnosis: Other  abnormalities of gait and mobility  Abnormal posture  Muscle weakness (generalized)     Problem List Patient Active Problem List   Diagnosis Date Noted  . Type 2 diabetes mellitus without complication ([place]) [id number]  . Bradycardia 09/01/2018  . Gangrene of left foot ([place]) 05/28/2018  . 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 Obstructive sleep apnea syndrome 05/04/2018  . Obstructive sleep apnea treated with continuous positive airway pressure (CPAP) 05/04/2018  . Type II diabetes mellitus, uncontrolled ([place]) 04/22/2018  . Atherosclerosis of native arteries of the extremities with gangrene ([place]) 04/09/2018  . Atherosclerosis of native artery of extremity ([place]) 04/09/2018  . Cellulitis in diabetic foot ([place]) 04/04/2018  . Paroxysmal atrial fibrillation ([place]) 12/05/2017  . Cardiac pacemaker in situ   . Symptomatic bradycardia   . Asystole ([place])   . 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 History of aortic valve replacement 12/02/2017  . Severe aortic stenosis   . Bilateral impacted cerumen 01/22/2016  . Essential hypertension 11/14/2015  . Peripheral vascular disease ([place]) 05/24/2014  . Chronic kidney disease 08/30/2013  . Hypothyroidism 08/30/2013  . 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 Diabetes mellitus type 2, uncomplicated ([place])   . Aortic valve stenosis 07/25/2011  . Hyperlipidemia 01/10/2011  . CAD (coronary artery disease) 01/10/2011  . Coronary arteriosclerosis 01/10/2011   Ermel Verne, PTA  Jaxzen Vanhorn A Chonda Baney 09/29/2018, 8:39 PM  [facility] [address] [place], [name], [address] Phone: [phone]   Fax:  [phone]  Name: Steven Maldonado MRN: [id number] Date of Birth: Jan 11, 1941

## 2018-09-30 NOTE — Progress Notes (Signed)
Remote pacemaker transmission.   

## 2018-10-01 ENCOUNTER — Ambulatory Visit: Payer: Self-pay | Admitting: *Deleted

## 2018-10-01 ENCOUNTER — Encounter: Payer: Self-pay | Admitting: Cardiology

## 2018-10-02 ENCOUNTER — Ambulatory Visit: Payer: Medicare Other

## 2018-10-02 DIAGNOSIS — R2689 Other abnormalities of gait and mobility: Secondary | ICD-10-CM

## 2018-10-02 DIAGNOSIS — M6281 Muscle weakness (generalized): Secondary | ICD-10-CM | POA: Diagnosis not present

## 2018-10-02 DIAGNOSIS — R293 Abnormal posture: Secondary | ICD-10-CM | POA: Diagnosis not present

## 2018-10-02 DIAGNOSIS — R2681 Unsteadiness on feet: Secondary | ICD-10-CM | POA: Diagnosis not present

## 2018-10-02 DIAGNOSIS — M6249 Contracture of muscle, multiple sites: Secondary | ICD-10-CM | POA: Diagnosis not present

## 2018-10-02 DIAGNOSIS — Z9181 History of falling: Secondary | ICD-10-CM | POA: Diagnosis not present

## 2018-10-02 NOTE — Therapy (Cosign Needed)
[facility] [address] [place], [name], [address] Phone: [phone]   Fax:  [phone]  Physical Therapy Treatment  Patient Details  Name: Steven Maldonado MRN: [id number] Date of Birth: 12-19-1940 Referring Provider (PT): Mechele Claude, [place]   Encounter Date: 10/02/2018  PT End of Session - 10/02/18 1557    Visit Number  10    Number of Visits  26    Date for PT Re-Evaluation  12/02/17    Authorization Type  Medicare & Federal BCBS    Authorization Time Period  Medicare guidelines, Fed BCBS waves co-pay.  75 visit limit PT, OT & ST combined with 1 used.    Authorization - Number of Visits  75    PT Start Time  5277    PT Stop Time  1400    PT Time Calculation (min)  45 min    Equipment Utilized During Treatment  Gait belt    Activity Tolerance  Patient tolerated treatment well    Behavior During Therapy  WFL for tasks assessed/performed       Past Medical History:  Diagnosis Date  . Chronic diastolic CHF (congestive heart failure) ([place])   . CKD (chronic kidney disease), stage III ([place])   . Constipation   . Coronary artery disease    a. Cath February 2012 in [facility], occluded RCA with collaterals  . DM type 2 (diabetes mellitus, type 2) ([place])   . Essential hypertension   . Hyperlipidemia   . Neuropathy    feet  . Pacemaker    a. symptomatic brady after TAVR s/p MDT PPM by Dr. Curt Bears 12/04/17  . Persistent atrial fibrillation   . PONV (postoperative nausea and vomiting)    after valve surgery  . PVD (peripheral vascular disease) ([place])    a. s/p R popliteal artery stenosis tx with drug-coated balloon 05/2014, followed by Dr. Fletcher Anon.  . 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 Severe aortic stenosis    a. 12/02/17: s/p TAVR  . Skin cancer   . Sleep apnea with use of continuous  positive airway pressure (CPAP)    04-11-11 AHI was 32.9 and titrated to 15 cm H20, DME is [facility]  . Subclinical hypothyroidism     Past Surgical History:  Procedure Laterality Date  . ABDOMINAL ANGIOGRAM N/A 06/08/2014   Procedure: ABDOMINAL ANGIOGRAM;  Surgeon: Wellington Hampshire, MD;  Location: [facility] CATH LAB;  Service: Cardiovascular;  Laterality: N/A;  . ABDOMINAL AORTOGRAM N/A 04/09/2018   Procedure: ABDOMINAL AORTOGRAM;  Surgeon: Conrad Tabernash, MD;  Location: [facility] CV LAB;  Service: Cardiovascular;  Laterality: N/A;  . AMPUTATION Left 04/17/2018   Procedure: LEFT FOOT 3RD RAY AMPUTATION;  Surgeon: Newt Minion, MD;  Location: [facility];  Service: Orthopedics;  Laterality: Left;  . AMPUTATION Left 05/28/2018   Procedure: LEFT AMPUTATION BELOW KNEE;  Surgeon: Wylene Simmer, MD;  Location: [facility];  Service: Orthopedics;  Laterality: Left;  . APPENDECTOMY  1965  . 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 CARDIAC CATHETERIZATION  12/2010  . CARDIOVERSION  07/2011  . CARDIOVERSION N/A 04/18/2014   Procedure: CARDIOVERSION;  Surgeon: Dorothy Spark, MD;  Location: [facility] ENDOSCOPY;  Service: Cardiovascular;  Laterality: N/A;  . CARDIOVERSION N/A 11/03/2015   Procedure: CARDIOVERSION;  Surgeon: Lelon Perla, MD;  Location: [facility];  Service: Cardiovascular;  Laterality: N/A;  . CARDIOVERSION N/A 05/08/2017   Procedure: CARDIOVERSION;  Surgeon: Dorothy Spark, MD;  Location: [facility] ENDOSCOPY;  Service: Cardiovascular;  Laterality: N/A;  . CARDIOVERSION N/A 07/28/2017   Procedure: CARDIOVERSION;  Surgeon: Dorothy Spark, MD;  Location: [facility];  Service: Cardiovascular;  Laterality: N/A;  . LOWER EXTREMITY ANGIOGRAM N/A 06/08/2014   Procedure: LOWER EXTREMITY ANGIOGRAM;  Surgeon: Wellington Hampshire, MD;  Location: [facility] CATH LAB;  Service: Cardiovascular;  Laterality: N/A;  . LOWER EXTREMITY ANGIOGRAPHY Left 04/09/2018   Procedure: Lower Extremity Angiography;  Surgeon: Conrad Summerville, MD;   Location: [facility] CV LAB;  Service: Cardiovascular;  Laterality: Left;  . PACEMAKER IMPLANT N/A 12/04/2017   Procedure: PACEMAKER IMPLANT;  Surgeon: Constance Haw, MD;  Location: [facility] CV LAB;  Service: Cardiovascular;  Laterality: N/A;  . PERIPHERAL VASCULAR BALLOON ANGIOPLASTY Left 04/09/2018   Procedure: PERIPHERAL VASCULAR BALLOON ANGIOPLASTY;  Surgeon: Conrad [place] Village, MD;  Location: [facility] CV LAB;  Service: Cardiovascular;  Laterality: Left;  SFA  . POPLITEAL ARTERY ANGIOPLASTY Right 06/08/2014   Archie Endo 06/08/2014  . RIGHT/LEFT HEART CATH AND CORONARY ANGIOGRAPHY N/A 10/08/2017   Procedure: RIGHT/LEFT HEART CATH AND CORONARY ANGIOGRAPHY;  Surgeon: Burnell Blanks, MD;  Location: [facility] CV LAB;  Service: Cardiovascular;  Laterality: N/A;  . SKIN CANCER EXCISION Bilateral    "have had them cut off back of neck X 2; off left upper arm; right wrist, near right shoulder blade" (06/08/2014)  . TEE WITHOUT CARDIOVERSION N/A 12/02/2017   Procedure: TRANSESOPHAGEAL ECHOCARDIOGRAM (TEE);  Surgeon: Burnell Blanks, MD;  Location: [facility];  Service: Open Heart Surgery;  Laterality: N/A;  . TEMPORARY PACEMAKER N/A 12/04/2017   Procedure: TEMPORARY PACEMAKER;  Surgeon: Leonie Man, MD;  Location: [facility] CV LAB;  Service: Cardiovascular;  Laterality: N/A;  . TRANSCATHETER AORTIC VALVE REPLACEMENT, TRANSFEMORAL N/A 12/02/2017   Procedure: TRANSCATHETER AORTIC VALVE REPLACEMENT, TRANSFEMORAL;  Surgeon: Burnell Blanks, MD;  Location: [facility];  Service: Open Heart Surgery;  Laterality: N/A;  using Edwards Sapien 3 Transcatheter Heart Valve size 34m    There were no vitals filed for this visit.  Subjective Assessment - 10/02/18 1317    Subjective  Leg doesnt feel as bad as it has been. [facility]     Patient is accompained by:  Family member    Pertinent History  L TTA, CAD, PAF,  pacemaker, DM2, neuropathy, CKD, HTN,      Limitations  Lifting;Standing;Walking;House hold activities    Patient Stated Goals  To use prosthesis in community, travel (uses vLucianne Lei, take of himself so wife does not have to do it. Housework.     Currently in Pain?  Yes    Pain Location  Leg    Pain Orientation  Left    Pain Onset  In the past 7 days                       [facility] Adult PT Treatment/Exercise - 10/02/18 1356      Ambulation/Gait   Ambulation/Gait  Yes    Ambulation/Gait Assistance  5: Supervision;4: Min assist    Ambulation/Gait Assistance Details  Pt walks with LWakemedfor 273fwith min A and required a seated rest break. Pt then walks 58 ft with RW due to increased pain on residual limb requiring supervison. vebal cues to increase step length on right.     Ambulation Distance (Feet)  73 Feet    Assistive device  Straight cane;Rolling walker;Prosthesis    Gait Pattern  Step-through pattern;Decreased step length - right;Decreased stance time - left;Decreased stride length;Decreased hip/knee flexion - left;Decreased weight shift to left;Left hip hike;Left circumduction;Antalgic;Lateral hip instability;Trunk flexed;Abducted - left;Step-to pattern    Ambulation Surface  Level;Indoor      Prosthetics   Prosthetic Care Comments   Pt comes in with liner on incorrectly and feels as though prosthesis is rotated. Pt recommened to see prosthetist to align prosthesis again. Pt also recommened to donn liner with 1 hand.     Residual limb condition   Rednedss noted over front of residula limb     Education Provided  Skin check;Correct ply sock adjustment;Proper Donning;Proper Doffing    Person(s) Educated  Patient;Spouse    Education Method  Explanation;Demonstration;Verbal cues;Tactile cues    Education Method  Verbalized understanding;Returned demonstration;Needs further instruction    Donning Prosthesis  Minimal assist    Doffing Prosthesis  Supervision;Minimal assist               PT Short Term Goals -  10/02/18 1557      PT SHORT TERM GOAL #1   Title  Patient donnes prosthesis with moderate assist for liner & minimal assist for prosthesis. (All STGs Target Date: 10/02/2018)    Baseline  11/19: Pt donnes liner with mod assist/prosthesis with min assist.     Time  1    Period  Months    Status  Achieved      PT SHORT TERM GOAL #2   Title  Patient tolerates prosthesis wear >12hrs daily without skin issues.     Baseline  11/19: Pt tolerates 10hrs daily without skin issues.     Time  1    Period  Months    Status  Partially Met      PT SHORT TERM GOAL #3   Title  Patient performs standing balance with RW & prosthesis reaching 10", to floor and managing pants for toileting.     Baseline  10/02/18: met today     Time  1    Period  Months    Status  Achieved      PT SHORT TERM GOAL #4   Title  Patient ambulates 200' with RW & prosthesis with supervision.     Baseline  11/08: Pt ambulates 200' with RW, Supervision    Time  1    Period  Months    Status  Achieved      PT SHORT TERM GOAL #5   Title  Patient negotiates ramps & curbs with RW and stairs with 2 rails with minimal assist.     Baseline  11/12: Pt negotiates ramps, curbs with RW, stairs with 2 rails with Supervision.     Time  1    Period  Months    Status  Achieved        PT Long Term Goals - 09/02/18 1800      PT LONG TERM GOAL #1   Title  Patient verbalizes & demonstrates proper prosthetic care including donning / doffing independently to enable safe use of prosthesis (All LTGs Target Date: 11/27/2018)    Time  3    Period  Months    Status  New    Target Date  11/27/18      PT LONG TERM GOAL #2   Title  Patient tolerates prosthesis wear daily for >90% of awake hours without skin or limb  pain issues to enable function throughout his day.     Time  3    Period  Months    Status  New    Target Date  11/27/18      PT LONG TERM GOAL #3   Title  Berg Balance >36/56 to indicate lower fall risk & less dependency  in standing ADLs.     Time  3    Period  Months    Status  New    Target Date  11/27/18      PT LONG TERM GOAL #4   Title  Patient ambulates 500' with RW (or rollator) & prosthesis outdoors on paved or grass up to 50' surfaces modified independent for community mobility.     Time  3    Period  Months    Status  New    Target Date  11/27/18      PT LONG TERM GOAL #5   Title  Patient negotiates ramps, curbs with walker & stairs 1 rail /cane modified independent for community access.     Time  3    Period  Months    Status  New    Target Date  11/27/18            Plan - 10/02/18 1558    Clinical Impression Statement  Today's treatment focused on assessing remaining STG for reaching and managing pants for toileting with pt meeting today. The remainder of the session focused on gait with RW and [facility] as well as donning/doffing prossthesis correct way. Pt presents with antalgic gait and experiences increased pain on residual limb when using cane. PT would benefit from further PT to continue progressing toward goals and improve functional independence with prosthesis.     Rehab Potential  Good    PT Frequency  2x / week    PT Duration  Other (comment)    PT Treatment/Interventions  ADLs/Self Care Home Management;Canalith Repostioning;DME Instruction;Gait training;Stair training;Functional mobility training;Therapeutic activities;Therapeutic exercise;Balance training;Neuromuscular re-education;Patient/family education;Prosthetic Training;Manual techniques;Vestibular    PT Next Visit Plan  Assess remaining STG, donning/doffing, gait with LBQC and high level balance on compliant surfaces.     Consulted and Agree with Plan of Care  Patient;Family member/caregiver    Family Member Consulted  wife       Patient will benefit from skilled therapeutic intervention in order to improve the following deficits and impairments:  Abnormal gait, Decreased activity tolerance, Decreased balance,  Decreased endurance, Decreased knowledge of use of DME, Decreased mobility, Impaired flexibility, Decreased range of motion, Decreased strength, Decreased skin integrity, Dizziness, Postural dysfunction, Prosthetic Dependency  Visit Diagnosis: Other abnormalities of gait and mobility  Abnormal posture  Muscle weakness (generalized)     Problem List Patient Active Problem List   Diagnosis Date Noted  . Type 2 diabetes mellitus without complication ([place]) 16/08/9603  . Bradycardia 09/01/2018  . Gangrene of left foot ([place]) 05/28/2018  . 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 Obstructive sleep apnea syndrome 05/04/2018  . Obstructive sleep apnea treated with continuous positive airway pressure (CPAP) 05/04/2018  . Type II diabetes mellitus, uncontrolled ([place]) 04/22/2018  . Atherosclerosis of native arteries of the extremities with gangrene ([place]) 04/09/2018  . Atherosclerosis of native artery of extremity ([place]) 04/09/2018  . Cellulitis in diabetic foot ([place]) 04/04/2018  . Paroxysmal atrial fibrillation ([place]) 12/05/2017  . Cardiac pacemaker in situ   . Symptomatic bradycardia   . Asystole ([place])   .  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 History of aortic valve replacement 12/02/2017  . Severe aortic stenosis   . Bilateral impacted cerumen 01/22/2016  . Essential hypertension 11/14/2015  . Peripheral vascular disease ([place]) 05/24/2014  . Chronic kidney disease 08/30/2013  . Hypothyroidism 08/30/2013  . 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 Diabetes mellitus type 2, uncomplicated ([place])   . Aortic valve stenosis 07/25/2011  . Hyperlipidemia 01/10/2011  . CAD (coronary artery disease) 01/10/2011  . Coronary arteriosclerosis 01/10/2011    Cecile Sheerer, SPTA 10/02/2018, 4:09 PM  [facility] [address] [place], [name],  [address] Phone: [phone]   Fax:  [phone]  Name: Steven Maldonado MRN: [id number] Date of Birth: 1940-12-08

## 2018-10-03 NOTE — Therapy (Addendum)
Progress Note Reporting Period 09/02/2018 to 10/02/2018  See note below for Objective Data and Assessment of Progress/Goals.   PT Short Term Goals - 10/12/18 9628      PT SHORT TERM GOAL #1   Title  Patient verbalizes adjusting ply socks with limb volume changes. (All Updated STGs Target Date: 10/30/2018)    Time  1    Period  Months    Status  New    Target Date  10/30/18      PT SHORT TERM GOAL #2   Title  Patient tolerates prosthesis wear >12hrs total /day without skin issues.     Time  1    Period  Months    Status  Revised    Target Date  10/30/18      PT SHORT TERM GOAL #3   Title  Patient reaches 5" & to knee level without UE support with supervision.     Time  1    Period  Months    Status  New    Target Date  10/30/18      PT SHORT TERM GOAL #4   Title  Patient ambulates 400' with RW & prosthesis with supervision.     Time  1    Period  Months    Status  Revised    Target Date  10/30/18      PT SHORT TERM GOAL #5   Title  Patient ambulates 100' with cane & prosthesis with minA.     Time  1    Period  Months    Status  New    Target Date  10/30/18       Jamey Reas, PT, DPT PT Specializing in [place] 10/12/18 6:44 AM Phone:  [phone]  Fax:  [phone] [facility] [address] [place], [place] [address]       This entire session was performed under direct supervision and direction of a licensed therapist assistant . The therapist was present and actively participating. I have personally read, edited and approve of the note as written.  Shylyn Younce, PTA  [facility] [address] [place], [name], [address] Phone: [phone]   Fax:  [phone]  Physical Therapy Treatment  Patient Details  Name: MYLEN MANGAN MRN: [id number] Date of Birth: [birth year] Referring Provider (PT): Mechele Claude, [place]   Encounter Date:  10/02/2018  PT End of Session - 10/02/18 1557    Visit Number  10    Number of Visits  26    Date for PT Re-Evaluation  12/02/17    Authorization Type  Medicare & Federal BCBS    Authorization Time Period  Medicare guidelines, Fed BCBS waves co-pay.  75 visit limit PT, OT & ST combined with 1 used.    Authorization - Number of Visits  75    PT Start Time  0300    PT Stop Time  1400    PT Time Calculation (min)  45 min    Equipment Utilized During Treatment  Gait belt    Activity Tolerance  Patient tolerated treatment well    Behavior During Therapy  WFL for tasks assessed/performed       Past Medical History:  Diagnosis Date  . Chronic diastolic CHF (congestive heart failure) ([place])   . CKD (chronic kidney disease), stage III ([place])   . Constipation   . Coronary artery disease    a. Cath February 2012 in  [facility], occluded RCA with collaterals  . DM type 2 (diabetes mellitus, type 2) ([place])   . Essential hypertension   . Hyperlipidemia   . Neuropathy    feet  . Pacemaker    a. symptomatic brady after TAVR s/p MDT PPM by Dr. Curt Bears 12/04/17  . Persistent atrial fibrillation   . PONV (postoperative nausea and vomiting)    after valve surgery  . PVD (peripheral vascular disease) ([place])    a. s/p R popliteal artery stenosis tx with drug-coated balloon 05/2014, followed by Dr. Fletcher Anon.  . 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 Severe aortic stenosis    a. 12/02/17: s/p TAVR  . Skin cancer   . Sleep apnea with use of continuous positive airway pressure (CPAP)    04-11-11 AHI was 32.9 and titrated to 15 cm H20, DME is [facility]  . Subclinical hypothyroidism     Past Surgical History:  Procedure Laterality Date  . ABDOMINAL ANGIOGRAM N/A 06/08/2014   Procedure: ABDOMINAL ANGIOGRAM;  Surgeon: Wellington Hampshire, MD;  Location: [facility] CATH LAB;  Service: Cardiovascular;   Laterality: N/A;  . ABDOMINAL AORTOGRAM N/A 04/09/2018   Procedure: ABDOMINAL AORTOGRAM;  Surgeon: Conrad [place]wood, MD;  Location: [facility] CV LAB;  Service: Cardiovascular;  Laterality: N/A;  . AMPUTATION Left 04/17/2018   Procedure: LEFT FOOT 3RD RAY AMPUTATION;  Surgeon: Newt Minion, MD;  Location: [facility];  Service: Orthopedics;  Laterality: Left;  . AMPUTATION Left 05/28/2018   Procedure: LEFT AMPUTATION BELOW KNEE;  Surgeon: Wylene Simmer, MD;  Location: [facility];  Service: Orthopedics;  Laterality: Left;  . APPENDECTOMY  1965  . 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 CARDIAC CATHETERIZATION  12/2010  . CARDIOVERSION  07/2011  . CARDIOVERSION N/A 04/18/2014   Procedure: CARDIOVERSION;  Surgeon: Dorothy Spark, MD;  Location: [facility];  Service: Cardiovascular;  Laterality: N/A;  . CARDIOVERSION N/A 11/03/2015   Procedure: CARDIOVERSION;  Surgeon: Lelon Perla, MD;  Location: [facility] ENDOSCOPY;  Service: Cardiovascular;  Laterality: N/A;  . CARDIOVERSION N/A 05/08/2017   Procedure: CARDIOVERSION;  Surgeon: Dorothy Spark, MD;  Location: [facility] ENDOSCOPY;  Service: Cardiovascular;  Laterality: N/A;  . CARDIOVERSION N/A 07/28/2017   Procedure: CARDIOVERSION;  Surgeon: Dorothy Spark, MD;  Location: [facility];  Service: Cardiovascular;  Laterality: N/A;  . LOWER EXTREMITY ANGIOGRAM N/A 06/08/2014   Procedure: LOWER EXTREMITY ANGIOGRAM;  Surgeon: Wellington Hampshire, MD;  Location: [facility] CATH LAB;  Service: Cardiovascular;  Laterality: N/A;  . LOWER EXTREMITY ANGIOGRAPHY Left 04/09/2018   Procedure: Lower Extremity Angiography;  Surgeon: Conrad [place], MD;  Location: [facility] CV LAB;  Service: Cardiovascular;  Laterality: Left;  . PACEMAKER IMPLANT N/A 12/04/2017   Procedure: PACEMAKER IMPLANT;  Surgeon: Constance Haw, MD;  Location: [facility] CV LAB;  Service: Cardiovascular;  Laterality: N/A;  . PERIPHERAL VASCULAR BALLOON ANGIOPLASTY Left 04/09/2018   Procedure: PERIPHERAL  VASCULAR BALLOON ANGIOPLASTY;  Surgeon: Conrad [place], MD;  Location: [facility] CV LAB;  Service: Cardiovascular;  Laterality: Left;  SFA  . POPLITEAL ARTERY ANGIOPLASTY Right 06/08/2014   Archie Endo 06/08/2014  . RIGHT/LEFT HEART CATH AND CORONARY ANGIOGRAPHY N/A 10/08/2017   Procedure: RIGHT/LEFT HEART CATH AND CORONARY ANGIOGRAPHY;  Surgeon: Burnell Blanks, MD;  Location: [facility] CV LAB;  Service: Cardiovascular;  Laterality: N/A;  . SKIN CANCER EXCISION Bilateral    "  have had them cut off back of neck X 2; off left upper arm; right wrist, near right shoulder blade" (06/08/2014)  . TEE WITHOUT CARDIOVERSION N/A 12/02/2017   Procedure: TRANSESOPHAGEAL ECHOCARDIOGRAM (TEE);  Surgeon: Burnell Blanks, MD;  Location: [facility];  Service: Open Heart Surgery;  Laterality: N/A;  . TEMPORARY PACEMAKER N/A 12/04/2017   Procedure: TEMPORARY PACEMAKER;  Surgeon: Leonie Man, MD;  Location: [facility] CV LAB;  Service: Cardiovascular;  Laterality: N/A;  . TRANSCATHETER AORTIC VALVE REPLACEMENT, TRANSFEMORAL N/A 12/02/2017   Procedure: TRANSCATHETER AORTIC VALVE REPLACEMENT, TRANSFEMORAL;  Surgeon: Burnell Blanks, MD;  Location: [facility];  Service: Open Heart Surgery;  Laterality: N/A;  using Edwards Sapien 3 Transcatheter Heart Valve size 20m    There were no vitals filed for this visit.  Subjective Assessment - 10/02/18 1317    Subjective  Leg doesnt feel as bad as it has been. [facility] [facility] [employer] [facility]     Patient is accompained by:  Family member    Pertinent History  L TTA, CAD, PAF,  pacemaker, DM2, neuropathy, CKD, HTN,     Limitations  Lifting;Standing;Walking;House hold activities    Patient Stated Goals  To use prosthesis in community, travel (uses vLucianne Lei, take of himself so wife does not have to do it. Housework.     Currently in Pain?  Yes    Pain Score  4     Pain Location  Leg    Pain Orientation  Left    Pain Descriptors / Indicators   Tender;Discomfort    Pain Type  Acute pain    Pain Onset  In the past 7 days    Aggravating Factors   walking    Pain Relieving Factors  off weighting prosthesis         [facility] Adult PT Treatment/Exercise - 10/02/18 1356      Ambulation/Gait   Ambulation/Gait  Yes    Ambulation/Gait Assistance  5: Supervision;4: Min assist    Ambulation/Gait Assistance Details  [facility] min A and required a seated rest break. Pt then walks 58 ft with RW due to increased pain on residual limb requiring supervison. vebal cues to increase step length on right.     Ambulation Distance (Feet)  73 Feet    Assistive device  Rolling walker;Prosthesis;Large base quad cane    Gait Pattern  Step-through pattern;Decreased step length - right;Decreased stance time - left;Decreased stride length;Decreased hip/knee flexion - left;Decreased weight shift to left;Left hip hike;Left circumduction;Antalgic;Lateral hip instability;Trunk flexed;Abducted - left;Step-to pattern    Ambulation Surface  Level;Indoor      Prosthetics   Prosthetic Care Comments   Pt comes in with liner on incorrectly and feels as though prosthesis is rotated. Pt recommened to see prosthetist to align prosthesis again. Pt also recommened to donn liner with 1 hand.     Residual limb condition   Rednedss noted over front of residula limb     Education Provided  Skin check;Correct ply sock adjustment;Proper Donning;Proper Doffing    Person(s) Educated  Patient;Spouse    Education Method  Explanation;Demonstration;Verbal cues;Tactile cues    Education Method  Verbalized understanding;Returned demonstration;Needs further instruction    Donning Prosthesis  Minimal assist    Doffing Prosthesis  Supervision;Minimal assist               PT Short Term Goals - 10/02/18 1557      PT SHORT TERM GOAL #1  Title  Patient donnes prosthesis with moderate assist for liner & minimal assist for prosthesis. (All STGs Target Date:  10/02/2018)    Baseline  11/19: Pt donnes liner with mod assist/prosthesis with min assist.     Time  1    Period  Months    Status  Achieved      PT SHORT TERM GOAL #2   Title  Patient tolerates prosthesis wear >12hrs daily without skin issues.     Baseline  11/19: Pt tolerates 10hrs daily without skin issues.     Time  1    Period  Months    Status  Partially Met      PT SHORT TERM GOAL #3   Title  Patient performs standing balance with RW & prosthesis reaching 10", to floor and managing pants for toileting.     Baseline  10/02/18: met today     Time  1    Period  Months    Status  Achieved      PT SHORT TERM GOAL #4   Title  Patient ambulates 200' with RW & prosthesis with supervision.     Baseline  11/08: Pt ambulates 200' with RW, Supervision    Time  1    Period  Months    Status  Achieved      PT SHORT TERM GOAL #5   Title  Patient negotiates ramps & curbs with RW and stairs with 2 rails with minimal assist.     Baseline  11/12: Pt negotiates ramps, curbs with RW, stairs with 2 rails with Supervision.     Time  1    Period  Months    Status  Achieved        PT Long Term Goals - 09/02/18 1800      PT LONG TERM GOAL #1   Title  Patient verbalizes & demonstrates proper prosthetic care including donning / doffing independently to enable safe use of prosthesis (All LTGs Target Date: 11/27/2018)    Time  3    Period  Months    Status  New    Target Date  11/27/18      PT LONG TERM GOAL #2   Title  Patient tolerates prosthesis wear daily for >90% of awake hours without skin or limb pain issues to enable function throughout his day.     Time  3    Period  Months    Status  New    Target Date  11/27/18      PT LONG TERM GOAL #3   Title  Berg Balance >36/56 to indicate lower fall risk & less dependency in standing ADLs.     Time  3    Period  Months    Status  New    Target Date  11/27/18      PT LONG TERM GOAL #4   Title  Patient ambulates 500' with RW (or  rollator) & prosthesis outdoors on paved or grass up to 50' surfaces modified independent for community mobility.     Time  3    Period  Months    Status  New    Target Date  11/27/18      PT LONG TERM GOAL #5   Title  Patient negotiates ramps, curbs with walker & stairs 1 rail /cane modified independent for community access.     Time  3    Period  Months    Status  New  Target Date  11/27/18       Plan - 10/02/18 1558    Clinical Impression Statement  Today's treatment focused on assessing remaining STG for reaching and managing pants for toileting with pt meeting today. The remainder of the session focused on gait with RW and [facility] as well as donning/doffing prossthesis correct way. Pt presents with antalgic gait and experiences increased pain on residual limb when using cane. Pt would benefit from further PT to continue progressing toward goals and improve functional independence with prosthesis.     Rehab Potential  Good    PT Frequency  2x / week    PT Duration  Other (comment)    PT Treatment/Interventions  ADLs/Self Care Home Management;Canalith Repostioning;DME Instruction;Gait training;Stair training;Functional mobility training;Therapeutic activities;Therapeutic exercise;Balance training;Neuromuscular re-education;Patient/family education;Prosthetic Training;Manual techniques;Vestibular    PT Next Visit Plan  Continue to work on proper donning/doffing, gait with LBQC and high level balance on compliant surfaces.     Consulted and Agree with Plan of Care  Patient;Family member/caregiver    Family Member Consulted  wife       Patient will benefit from skilled therapeutic intervention in order to improve the following deficits and impairments:  Abnormal gait, Decreased activity tolerance, Decreased balance, Decreased endurance, Decreased knowledge of use of DME, Decreased mobility, Impaired flexibility, Decreased range of motion, Decreased strength, Decreased skin integrity,  Dizziness, Postural dysfunction, Prosthetic Dependency  Visit Diagnosis: Other abnormalities of gait and mobility  Abnormal posture  Muscle weakness (generalized)     Problem List Patient Active Problem List   Diagnosis Date Noted  . Type 2 diabetes mellitus without complication ([place]) 94/05/6807  . Bradycardia 09/01/2018  . Gangrene of left foot ([place]) 05/28/2018  . 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 Obstructive sleep apnea syndrome 05/04/2018  . Obstructive sleep apnea treated with continuous positive airway pressure (CPAP) 05/04/2018  . Type II diabetes mellitus, uncontrolled ([place]) 04/22/2018  . Atherosclerosis of native arteries of the extremities with gangrene ([place]) 04/09/2018  . Atherosclerosis of native artery of extremity ([place]) 04/09/2018  . Cellulitis in diabetic foot ([place]) 04/04/2018  . Paroxysmal atrial fibrillation ([place]) 12/05/2017  . Cardiac pacemaker in situ   . Symptomatic bradycardia   . Asystole ([place])   . 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 History of aortic valve replacement 12/02/2017  . Severe aortic stenosis   . Bilateral impacted cerumen 01/22/2016  . Essential hypertension 11/14/2015  . Peripheral vascular disease ([place]) 05/24/2014  . Chronic kidney disease 08/30/2013  . Hypothyroidism 08/30/2013  . 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 Diabetes mellitus type 2, uncomplicated ([place])   . Aortic valve stenosis 07/25/2011  . Hyperlipidemia 01/10/2011  . CAD (coronary artery disease) 01/10/2011  . Coronary arteriosclerosis 01/10/2011   Quinterrius Errington, PTA  [facility] A Arrion Burruel 10/03/2018, 12:42 PM  [facility] [address] [place], [name], [address] Phone: [phone]   Fax:  [phone]  Name: Steven Maldonado MRN: [id number] Date of Birth: [birth year]

## 2018-10-06 ENCOUNTER — Encounter: Payer: Self-pay | Admitting: Physical Therapy

## 2018-10-06 ENCOUNTER — Ambulatory Visit: Payer: Medicare Other | Admitting: Physical Therapy

## 2018-10-06 DIAGNOSIS — M6281 Muscle weakness (generalized): Secondary | ICD-10-CM

## 2018-10-06 DIAGNOSIS — R2689 Other abnormalities of gait and mobility: Secondary | ICD-10-CM

## 2018-10-06 DIAGNOSIS — R293 Abnormal posture: Secondary | ICD-10-CM | POA: Diagnosis not present

## 2018-10-06 DIAGNOSIS — M6249 Contracture of muscle, multiple sites: Secondary | ICD-10-CM | POA: Diagnosis not present

## 2018-10-06 DIAGNOSIS — R2681 Unsteadiness on feet: Secondary | ICD-10-CM | POA: Diagnosis not present

## 2018-10-06 DIAGNOSIS — Z9181 History of falling: Secondary | ICD-10-CM | POA: Diagnosis not present

## 2018-10-06 NOTE — Therapy (Signed)
[facility] [address] [place], [name], [address] Phone: [phone]   Fax:  [phone]  Physical Therapy Treatment  Patient Details  Name: Steven Maldonado MRN: [id number] Date of Birth: 06-Jul-1941 Referring Provider (PT): Mechele Claude, [place]   Encounter Date: 10/06/2018  PT End of Session - 10/06/18 1408    Visit Number  11    Number of Visits  26    Date for PT Re-Evaluation  12/02/17    Authorization Type  Medicare & Federal BCBS    Authorization Time Period  Medicare guidelines, Fed BCBS waves co-pay.  75 visit limit PT, OT & ST combined with 1 used.    Authorization - Number of Visits  75    PT Start Time  8115    PT Stop Time  1400    PT Time Calculation (min)  43 min    Equipment Utilized During Treatment  Gait belt    Activity Tolerance  Patient tolerated treatment well;Patient limited by pain    Behavior During Therapy  WFL for tasks assessed/performed       Past Medical History:  Diagnosis Date  . Chronic diastolic CHF (congestive heart failure) ([place])   . CKD (chronic kidney disease), stage III ([place])   . Constipation   . Coronary artery disease    a. Cath February 2012 in [facility], occluded RCA with collaterals  . DM type 2 (diabetes mellitus, type 2) ([place])   . Essential hypertension   . Hyperlipidemia   . Neuropathy    feet  . Pacemaker    a. symptomatic brady after TAVR s/p MDT PPM by Dr. Curt Bears 12/04/17  . Persistent atrial fibrillation   . PONV (postoperative nausea and vomiting)    after valve surgery  . PVD (peripheral vascular disease) ([place])    a. s/p R popliteal artery stenosis tx with drug-coated balloon 05/2014, followed by Dr. Fletcher Anon.  . 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 Severe aortic stenosis    a. 12/02/17: s/p TAVR  . Skin cancer   . Sleep apnea  with use of continuous positive airway pressure (CPAP)    04-11-11 AHI was 32.9 and titrated to 15 cm H20, DME is [facility]  . Subclinical hypothyroidism     Past Surgical History:  Procedure Laterality Date  . ABDOMINAL ANGIOGRAM N/A 06/08/2014   Procedure: ABDOMINAL ANGIOGRAM;  Surgeon: Wellington Hampshire, MD;  Location: [facility] CATH LAB;  Service: Cardiovascular;  Laterality: N/A;  . ABDOMINAL AORTOGRAM N/A 04/09/2018   Procedure: ABDOMINAL AORTOGRAM;  Surgeon: Conrad Willow Oak, MD;  Location: [facility] CV LAB;  Service: Cardiovascular;  Laterality: N/A;  . AMPUTATION Left 04/17/2018   Procedure: LEFT FOOT 3RD RAY AMPUTATION;  Surgeon: Newt Minion, MD;  Location: [facility];  Service: Orthopedics;  Laterality: Left;  . AMPUTATION Left 05/28/2018   Procedure: LEFT AMPUTATION BELOW KNEE;  Surgeon: Wylene Simmer, MD;  Location: [facility];  Service: Orthopedics;  Laterality: Left;  . APPENDECTOMY  1965  . 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 CARDIAC CATHETERIZATION  12/2010  . CARDIOVERSION  07/2011  . CARDIOVERSION N/A 04/18/2014   Procedure: CARDIOVERSION;  Surgeon: Dorothy Spark, MD;  Location: [facility] ENDOSCOPY;  Service: Cardiovascular;  Laterality: N/A;  . CARDIOVERSION N/A 11/03/2015   Procedure: CARDIOVERSION;  Surgeon: Lelon Perla, MD;  Location: [facility];  Service: Cardiovascular;  Laterality: N/A;  . CARDIOVERSION N/A 05/08/2017   Procedure: CARDIOVERSION;  Surgeon: Dorothy Spark, MD;  Location: [facility] ENDOSCOPY;  Service: Cardiovascular;  Laterality: N/A;  . CARDIOVERSION N/A 07/28/2017   Procedure: CARDIOVERSION;  Surgeon: Dorothy Spark, MD;  Location: [facility];  Service: Cardiovascular;  Laterality: N/A;  . LOWER EXTREMITY ANGIOGRAM N/A 06/08/2014   Procedure: LOWER EXTREMITY ANGIOGRAM;  Surgeon: Wellington Hampshire, MD;  Location: [facility] CATH LAB;  Service: Cardiovascular;  Laterality: N/A;  . LOWER EXTREMITY ANGIOGRAPHY Left 04/09/2018   Procedure: Lower Extremity Angiography;  Surgeon:  Conrad Selfridge, MD;  Location: [facility] CV LAB;  Service: Cardiovascular;  Laterality: Left;  . PACEMAKER IMPLANT N/A 12/04/2017   Procedure: PACEMAKER IMPLANT;  Surgeon: Constance Haw, MD;  Location: [facility] CV LAB;  Service: Cardiovascular;  Laterality: N/A;  . PERIPHERAL VASCULAR BALLOON ANGIOPLASTY Left 04/09/2018   Procedure: PERIPHERAL VASCULAR BALLOON ANGIOPLASTY;  Surgeon: Conrad [place], MD;  Location: [facility] CV LAB;  Service: Cardiovascular;  Laterality: Left;  SFA  . POPLITEAL ARTERY ANGIOPLASTY Right 06/08/2014   Archie Endo 06/08/2014  . RIGHT/LEFT HEART CATH AND CORONARY ANGIOGRAPHY N/A 10/08/2017   Procedure: RIGHT/LEFT HEART CATH AND CORONARY ANGIOGRAPHY;  Surgeon: Burnell Blanks, MD;  Location: [facility] CV LAB;  Service: Cardiovascular;  Laterality: N/A;  . SKIN CANCER EXCISION Bilateral    "have had them cut off back of neck X 2; off left upper arm; right wrist, near right shoulder blade" (06/08/2014)  . TEE WITHOUT CARDIOVERSION N/A 12/02/2017   Procedure: TRANSESOPHAGEAL ECHOCARDIOGRAM (TEE);  Surgeon: Burnell Blanks, MD;  Location: [facility];  Service: Open Heart Surgery;  Laterality: N/A;  . TEMPORARY PACEMAKER N/A 12/04/2017   Procedure: TEMPORARY PACEMAKER;  Surgeon: Leonie Man, MD;  Location: [facility] CV LAB;  Service: Cardiovascular;  Laterality: N/A;  . TRANSCATHETER AORTIC VALVE REPLACEMENT, TRANSFEMORAL N/A 12/02/2017   Procedure: TRANSCATHETER AORTIC VALVE REPLACEMENT, TRANSFEMORAL;  Surgeon: Burnell Blanks, MD;  Location: [facility];  Service: Open Heart Surgery;  Laterality: N/A;  using Edwards Sapien 3 Transcatheter Heart Valve size 16m    There were no vitals filed for this visit.  Subjective Assessment - 10/06/18 1322    Subjective  Leg doesnt feel as bad as it has been, howvever still painful with weight bearing. [facility] pads in, but doesn't feel like it has changed  anything.Today was the first time he drove here by himself.      Patient is accompained by:  Family member    Pertinent History  L TTA, CAD, PAF,  pacemaker, DM2, neuropathy, CKD, HTN,     Limitations  Lifting;Standing;Walking;House hold activities    Patient Stated Goals  To use prosthesis in community, travel (uses vLucianne Lei, take of himself so wife does not have to do it. Housework.     Currently in Pain?  Yes    Pain Score  3     Pain Location  Leg    Pain Orientation  Left    Pain Descriptors / Indicators  Tender    Pain Type  Acute pain    Pain Onset  In the past 7 days    Aggravating Factors   walking    Pain Relieving Factors  with weight off prosthesis        [facility] Adult PT Treatment/Exercise - 10/06/18 1401      Ambulation/Gait   Ambulation/Gait  Yes  Ambulation/Gait Assistance  5: Supervision;4: Min guard    Ambulation/Gait Assistance Details  Pt ambulated after ice massage with decreased pain    Ambulation Distance (Feet)  20 Feet    Assistive device  Rolling walker    Gait Pattern  Step-through pattern;Decreased step length - right;Decreased stance time - left;Decreased stride length;Decreased hip/knee flexion - left;Decreased weight shift to left;Left hip hike;Left circumduction;Antalgic;Lateral hip instability;Trunk flexed;Abducted - left;Step-to pattern    Ambulation Surface  Level;Indoor      Prosthetics   Prosthetic Care Comments   Pt required assistance donning prosthesis/liner multiple times for correct positioning and ply sock adjustment with goal of decreased pain with prosthesis wear/weight bearing. Unable to achieve this with increased pain as reps progressed. Therefore educated pt on and performed ice massage to painful areas of residual limb resulting in decreased pain. Pt to try this at home. Pt also to see prosthetist tomorrow for fit check/adjustements working toward decreased pain as well.     Residual limb condition   Redness noted at knee and lateral area or  resiudal limb.    Education Provided  Skin check;Correct ply sock adjustment    Person(s) Educated  Patient    Education Method  Explanation;Demonstration    Education Method  Verbalized understanding;Needs further instruction    Donning Prosthesis  Minimal assist;Moderate assist    Doffing Prosthesis  Supervision               PT Education - 10/06/18 1406    Education Details  Pt educated on proper donning of prosthesis for proper weight distribution to reduce skin irritation. Use of and benefits of ice massage to irritated areas on skin.    Person(s) Educated  Patient    Methods  Explanation;Demonstration    Comprehension  Verbalized understanding;Need further instruction       PT Short Term Goals - 10/02/18 1557      PT SHORT TERM GOAL #1   Title  Patient donnes prosthesis with moderate assist for liner & minimal assist for prosthesis. (All STGs Target Date: 10/02/2018)    Baseline  11/19: Pt donnes liner with mod assist/prosthesis with min assist.     Time  1    Period  Months    Status  Achieved      PT SHORT TERM GOAL #2   Title  Patient tolerates prosthesis wear >12hrs daily without skin issues.     Baseline  11/19: Pt tolerates 10hrs daily without skin issues.     Time  1    Period  Months    Status  Partially Met      PT SHORT TERM GOAL #3   Title  Patient performs standing balance with RW & prosthesis reaching 10", to floor and managing pants for toileting.     Baseline  10/02/18: met today     Time  1    Period  Months    Status  Achieved      PT SHORT TERM GOAL #4   Title  Patient ambulates 200' with RW & prosthesis with supervision.     Baseline  11/08: Pt ambulates 200' with RW, Supervision    Time  1    Period  Months    Status  Achieved      PT SHORT TERM GOAL #5   Title  Patient negotiates ramps & curbs with RW and stairs with 2 rails with minimal assist.     Baseline  11/12: Pt negotiates ramps, curbs  with RW, stairs with 2 rails with  Supervision.     Time  1    Period  Months    Status  Achieved        PT Long Term Goals - 09/02/18 1800      PT LONG TERM GOAL #1   Title  Patient verbalizes & demonstrates proper prosthetic care including donning / doffing independently to enable safe use of prosthesis (All LTGs Target Date: 11/27/2018)    Time  3    Period  Months    Status  New    Target Date  11/27/18      PT LONG TERM GOAL #2   Title  Patient tolerates prosthesis wear daily for >90% of awake hours without skin or limb pain issues to enable function throughout his day.     Time  3    Period  Months    Status  New    Target Date  11/27/18      PT LONG TERM GOAL #3   Title  Berg Balance >36/56 to indicate lower fall risk & less dependency in standing ADLs.     Time  3    Period  Months    Status  New    Target Date  11/27/18      PT LONG TERM GOAL #4   Title  Patient ambulates 500' with RW (or rollator) & prosthesis outdoors on paved or grass up to 50' surfaces modified independent for community mobility.     Time  3    Period  Months    Status  New    Target Date  11/27/18      PT LONG TERM GOAL #5   Title  Patient negotiates ramps, curbs with walker & stairs 1 rail /cane modified independent for community access.     Time  3    Period  Months    Status  New    Target Date  11/27/18            Plan - 10/06/18 1411    Clinical Impression Statement  Today's treatment focused on pain management and adjusting pt's prosthesis with multiple attempts to donn with correct sock ply amount to decrease pain on residual limb. Ice massage applied with appropriate results. PTA made appointment with [facility] to address prosthesis fit issue. Pt would benefit from further PT to improve functional independence with prosthesis.      Rehab Potential  Good    PT Frequency  2x / week    PT Duration  Other (comment)    PT Treatment/Interventions  ADLs/Self Care Home Management;Canalith Repostioning;DME  Instruction;Gait training;Stair training;Functional mobility training;Therapeutic activities;Therapeutic exercise;Balance training;Neuromuscular re-education;Patient/family education;Prosthetic Training;Manual techniques;Vestibular    PT Next Visit Plan  Continue to work on proper donning/doffing, gait with LBQC and high level balance on compliant surfaces.     Consulted and Agree with Plan of Care  Patient;Family member/caregiver    Family Member Consulted  wife       Patient will benefit from skilled therapeutic intervention in order to improve the following deficits and impairments:  Abnormal gait, Decreased activity tolerance, Decreased balance, Decreased endurance, Decreased knowledge of use of DME, Decreased mobility, Impaired flexibility, Decreased range of motion, Decreased strength, Decreased skin integrity, Dizziness, Postural dysfunction, Prosthetic Dependency  Visit Diagnosis: Other abnormalities of gait and mobility  Muscle weakness (generalized)  Unsteadiness on feet     Problem List Patient Active Problem List   Diagnosis Date  Noted  . Type 2 diabetes mellitus without complication ([place]) 78/67/6720  . Bradycardia 09/01/2018  . Gangrene of left foot ([place]) 05/28/2018  . 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 Obstructive sleep apnea syndrome 05/04/2018  . Obstructive sleep apnea treated with continuous positive airway pressure (CPAP) 05/04/2018  . Type II diabetes mellitus, uncontrolled ([place]) 04/22/2018  . Atherosclerosis of native arteries of the extremities with gangrene ([place]) 04/09/2018  . Atherosclerosis of native artery of extremity ([place]) 04/09/2018  . Cellulitis in diabetic foot ([place]) 04/04/2018  . Paroxysmal atrial fibrillation ([place]) 12/05/2017  . Cardiac pacemaker in situ   . Symptomatic bradycardia   . Asystole ([place])   . 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 History of aortic valve replacement  12/02/2017  . Severe aortic stenosis   . Bilateral impacted cerumen 01/22/2016  . Essential hypertension 11/14/2015  . Peripheral vascular disease ([place]) 05/24/2014  . Chronic kidney disease 08/30/2013  . Hypothyroidism 08/30/2013  . 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 Diabetes mellitus type 2, uncomplicated ([place])   . Aortic valve stenosis 07/25/2011  . Hyperlipidemia 01/10/2011  . CAD (coronary artery disease) 01/10/2011  . Coronary arteriosclerosis 01/10/2011    Cecile Sheerer, SPTA 10/06/2018, 2:27 PM  [facility] [address] [place], [name], [address] Phone: [phone]   Fax:  [phone]  Name: ESTUS KRAKOWSKI MRN: [id number] Date of Birth: 1941/05/07

## 2018-10-07 ENCOUNTER — Encounter: Payer: Medicare Other | Attending: Physician Assistant | Admitting: Registered"

## 2018-10-07 DIAGNOSIS — E119 Type 2 diabetes mellitus without complications: Secondary | ICD-10-CM

## 2018-10-07 DIAGNOSIS — Z794 Long term (current) use of insulin: Secondary | ICD-10-CM | POA: Diagnosis not present

## 2018-10-07 DIAGNOSIS — Z713 Dietary counseling and surveillance: Secondary | ICD-10-CM | POA: Insufficient documentation

## 2018-10-07 DIAGNOSIS — E1165 Type 2 diabetes mellitus with hyperglycemia: Secondary | ICD-10-CM | POA: Insufficient documentation

## 2018-10-07 NOTE — Progress Notes (Signed)
Diabetes Self-Management Education  Visit Type: Follow-up  Appt. Start Time: 1145 Appt. End Time: 1215  10/07/2018  [name], identified by name and date of birth, is a 77 y.o. male with a diagnosis of Diabetes:  .   ASSESSMENT Patient brought in BG log and numbers reflect good control as well as his most recent A1c of 6.1%. Patient's wife controls what he eats and patient is happy that she does that for him. Patient states the changes are not hard for him and does not have cravings. From described typical meals, patient is likely getting 15-30 carbs with most meals. Patient states he stopped eating a whole banana at one time and now may have 1/2 or 1/3 with a keto shake. Pt states occasionally he will eat out and have more bread. Pt states at his next MD appt he may get a prescription for a continuous glucose monitor and is looking forward to that.  Patient states he has a hard time being patient with the healing process and the time it takes to get used to prosthetic leg. Patient states he has had to reduce his walking time due to rubbing on his leg and wants to avoid getting sores that could potentially break the skin. Pt states although he cannot walk much (ambulates with walker), he does PT regularly until the end of January and continues PT exercises on his own.  Diabetes Self-Management Education - 10/07/18 1152      Visit Information   Visit Type  Follow-up      Initial Visit   Are you currently following a meal plan?  Yes    What type of meal plan do you follow?  low carb    Are you taking your medications as prescribed?  Yes      Psychosocial Assessment   Patient Belief/Attitude about Diabetes  Motivated to manage diabetes    Other persons present  Spouse/SO      Complications   Last HgB A1C per patient/outside source  6.1 %    How often do you check your blood sugar?  3-4 times/day    Fasting Blood glucose range (mg/dL)  <70;70-129    Postprandial Blood glucose  range (mg/dL)  70-129   occassionally goes higher   Number of hypoglycemic episodes per month  1    Can you tell when your blood sugar is low?  Yes    What do you do if your blood sugar is low?  same    Number of hyperglycemic episodes per week  0      Dietary Intake   Breakfast  keto shake, 1/3 banana OR 2 eggs , sausage or bacon    Lunch  egg or chicken salad, crackers, raw vegetables     Dinner  chili a few crackers    Snack (evening)  sugar free popciles, if BG okay popcorn    Beverage(s)  water, diet lemonade      Exercise   Exercise Type  Light (walking / raking leaves)    How many days per week to you exercise?  7    How many minutes per day do you exercise?  20    Total minutes per week of exercise  140      Patient Education   Nutrition management   Reviewed blood glucose goals for pre and post meals and how to evaluate the patients' food intake on their blood glucose level.    Physical activity and exercise  Role of exercise on diabetes management, blood pressure control and cardiac health.      Individualized Goals (developed by patient)   Nutrition  General guidelines for healthy choices and portions discussed    Physical Activity  Exercise 5-7 days per week    Monitoring   test my blood glucose as discussed      Patient Self-Evaluation of Goals - Patient rates self as meeting previously set goals (% of time)   Nutrition  >75%    Physical Activity  >75%    Medications  >75%    Monitoring  >75%    Reducing Risk  50 - 75 %      Outcomes   Expected Outcomes  Demonstrated interest in learning. Expect positive outcomes    Future DMSE  PRN    Program Status  Completed      Subsequent Visit   Since your last visit have you continued or begun to take your medications as prescribed?  Yes    Since your last visit have you experienced any weight changes?  No change    Since your last visit, are you checking your blood glucose at least once a day?  Yes       Individualized Plan for Diabetes Self-Management Training:   Learning Objective:  Patient will have a greater understanding of diabetes self-management. Patient education plan is to attend individual and/or group sessions per assessed needs and concerns.   Patient Instructions  Continue eating beans on a regular basis Continue having fish weekly Continue eating lean meat Continue with the exercise Continue drinking plenty of water Continue with your strategies to prevent low blood sugar  Expected Outcomes:  Demonstrated interest in learning. Expect positive outcomes  Education material provided: none  If problems or questions, patient to contact team via:  Phone  Future DSME appointment: PRN

## 2018-10-07 NOTE — Patient Instructions (Addendum)
Continue eating beans on a regular basis Continue having fish weekly Continue eating lean meat Continue with the exercise Continue drinking plenty of water Continue with your strategies to prevent low blood sugar

## 2018-10-09 ENCOUNTER — Other Ambulatory Visit: Payer: Self-pay | Admitting: Endocrinology

## 2018-10-13 ENCOUNTER — Other Ambulatory Visit: Payer: Self-pay | Admitting: *Deleted

## 2018-10-13 ENCOUNTER — Ambulatory Visit: Payer: Medicare Other | Attending: Student | Admitting: Physical Therapy

## 2018-10-13 ENCOUNTER — Encounter: Payer: Self-pay | Admitting: Physical Therapy

## 2018-10-13 DIAGNOSIS — R2681 Unsteadiness on feet: Secondary | ICD-10-CM | POA: Insufficient documentation

## 2018-10-13 DIAGNOSIS — R293 Abnormal posture: Secondary | ICD-10-CM | POA: Insufficient documentation

## 2018-10-13 DIAGNOSIS — R2689 Other abnormalities of gait and mobility: Secondary | ICD-10-CM

## 2018-10-13 DIAGNOSIS — M6281 Muscle weakness (generalized): Secondary | ICD-10-CM | POA: Insufficient documentation

## 2018-10-13 MED ORDER — ATORVASTATIN CALCIUM 80 MG PO TABS: 80.0000 mg | ORAL_TABLET | Freq: Every day | ORAL | 3 refills | Status: DC

## 2018-10-14 NOTE — Therapy (Signed)
[facility] [address] [place], [name], [address] Phone: [phone]   Fax:  [phone]  Physical Therapy Treatment  Patient Details  Name: Steven Maldonado MRN: [id number] Date of Birth: August 06, 1941 Referring Provider (PT): Mechele Claude, [place]   Encounter Date: 10/13/2018  PT End of Session - 10/13/18 1800    Visit Number  12    Number of Visits  26    Date for PT Re-Evaluation  12/02/17    Authorization Type  Medicare & Federal BCBS    Authorization Time Period  Medicare guidelines, Fed BCBS waves co-pay.  75 visit limit PT, OT & ST combined with 1 used.    Authorization - Number of Visits  75    PT Start Time  0102    PT Stop Time  1400    PT Time Calculation (min)  45 min    Equipment Utilized During Treatment  Gait belt    Activity Tolerance  Patient tolerated treatment well;Patient limited by pain    Behavior During Therapy  WFL for tasks assessed/performed       Past Medical History:  Diagnosis Date  . Chronic diastolic CHF (congestive heart failure) ([place])   . CKD (chronic kidney disease), stage III ([place])   . Constipation   . Coronary artery disease    a. Cath February 2012 in [facility], occluded RCA with collaterals  . DM type 2 (diabetes mellitus, type 2) ([place])   . Essential hypertension   . Hyperlipidemia   . Neuropathy    feet  . Pacemaker    a. symptomatic brady after TAVR s/p MDT PPM by Dr. Curt Bears 12/04/17  . Persistent atrial fibrillation   . PONV (postoperative nausea and vomiting)    after valve surgery  . PVD (peripheral vascular disease) ([place])    a. s/p R popliteal artery stenosis tx with drug-coated balloon 05/2014, followed by Dr. Fletcher Anon.  . 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 Severe aortic stenosis    a. 12/02/17: s/p TAVR  . Skin cancer   . Sleep apnea with  use of continuous positive airway pressure (CPAP)    04-11-11 AHI was 32.9 and titrated to 15 cm H20, DME is [facility]  . Subclinical hypothyroidism     Past Surgical History:  Procedure Laterality Date  . ABDOMINAL ANGIOGRAM N/A 06/08/2014   Procedure: ABDOMINAL ANGIOGRAM;  Surgeon: Wellington Hampshire, MD;  Location: [facility] CATH LAB;  Service: Cardiovascular;  Laterality: N/A;  . ABDOMINAL AORTOGRAM N/A 04/09/2018   Procedure: ABDOMINAL AORTOGRAM;  Surgeon: Conrad Lineville, MD;  Location: [facility] CV LAB;  Service: Cardiovascular;  Laterality: N/A;  . AMPUTATION Left 04/17/2018   Procedure: LEFT FOOT 3RD RAY AMPUTATION;  Surgeon: Newt Minion, MD;  Location: [facility];  Service: Orthopedics;  Laterality: Left;  . AMPUTATION Left 05/28/2018   Procedure: LEFT AMPUTATION BELOW KNEE;  Surgeon: Wylene Simmer, MD;  Location: [facility];  Service: Orthopedics;  Laterality: Left;  . APPENDECTOMY  1965  . 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 CARDIAC CATHETERIZATION  12/2010  . CARDIOVERSION  07/2011  . CARDIOVERSION N/A 04/18/2014   Procedure: CARDIOVERSION;  Surgeon: Dorothy Spark, MD;  Location: [facility] ENDOSCOPY;  Service: Cardiovascular;  Laterality: N/A;  . CARDIOVERSION N/A 11/03/2015   Procedure: CARDIOVERSION;  Surgeon: Lelon Perla, MD;  Location: [facility];  Service: Cardiovascular;  Laterality: N/A;  . CARDIOVERSION N/A 05/08/2017   Procedure: CARDIOVERSION;  Surgeon: Dorothy Spark, MD;  Location: [facility] ENDOSCOPY;  Service: Cardiovascular;  Laterality: N/A;  . CARDIOVERSION N/A 07/28/2017   Procedure: CARDIOVERSION;  Surgeon: Dorothy Spark, MD;  Location: [facility];  Service: Cardiovascular;  Laterality: N/A;  . LOWER EXTREMITY ANGIOGRAM N/A 06/08/2014   Procedure: LOWER EXTREMITY ANGIOGRAM;  Surgeon: Wellington Hampshire, MD;  Location: [facility] CATH LAB;  Service: Cardiovascular;  Laterality: N/A;  . LOWER EXTREMITY ANGIOGRAPHY Left 04/09/2018   Procedure: Lower Extremity Angiography;  Surgeon: Conrad Myrtle Beach, MD;  Location: [facility] CV LAB;  Service: Cardiovascular;  Laterality: Left;  . PACEMAKER IMPLANT N/A 12/04/2017   Procedure: PACEMAKER IMPLANT;  Surgeon: Constance Haw, MD;  Location: [facility] CV LAB;  Service: Cardiovascular;  Laterality: N/A;  . PERIPHERAL VASCULAR BALLOON ANGIOPLASTY Left 04/09/2018   Procedure: PERIPHERAL VASCULAR BALLOON ANGIOPLASTY;  Surgeon: Conrad McKinley, MD;  Location: [facility] CV LAB;  Service: Cardiovascular;  Laterality: Left;  SFA  . POPLITEAL ARTERY ANGIOPLASTY Right 06/08/2014   Archie Endo 06/08/2014  . RIGHT/LEFT HEART CATH AND CORONARY ANGIOGRAPHY N/A 10/08/2017   Procedure: RIGHT/LEFT HEART CATH AND CORONARY ANGIOGRAPHY;  Surgeon: Burnell Blanks, MD;  Location: [facility] CV LAB;  Service: Cardiovascular;  Laterality: N/A;  . SKIN CANCER EXCISION Bilateral    "have had them cut off back of neck X 2; off left upper arm; right wrist, near right shoulder blade" (06/08/2014)  . TEE WITHOUT CARDIOVERSION N/A 12/02/2017   Procedure: TRANSESOPHAGEAL ECHOCARDIOGRAM (TEE);  Surgeon: Burnell Blanks, MD;  Location: [facility];  Service: Open Heart Surgery;  Laterality: N/A;  . TEMPORARY PACEMAKER N/A 12/04/2017   Procedure: TEMPORARY PACEMAKER;  Surgeon: Leonie Man, MD;  Location: [facility] CV LAB;  Service: Cardiovascular;  Laterality: N/A;  . TRANSCATHETER AORTIC VALVE REPLACEMENT, TRANSFEMORAL N/A 12/02/2017   Procedure: TRANSCATHETER AORTIC VALVE REPLACEMENT, TRANSFEMORAL;  Surgeon: Burnell Blanks, MD;  Location: [facility];  Service: Open Heart Surgery;  Laterality: N/A;  using Edwards Sapien 3 Transcatheter Heart Valve size 74mm    There were no vitals filed for this visit.  Subjective Assessment - 10/13/18 1323    Subjective  He reports wearing prosthesis 6 hrs 2x/day without issues except some tenderness still present at distal tibia.    Patient is accompained by:  Family member    Pertinent History  L TTA, CAD, PAF,   pacemaker, DM2, neuropathy, CKD, HTN,     Limitations  Lifting;Standing;Walking;House hold activities    Patient Stated Goals  To use prosthesis in community, travel (uses [employer]), take of himself so wife does not have to do it. Housework.     Currently in Pain?  Yes    Pain Score  3     Pain Location  Leg    Pain Orientation  Distal;Left    Pain Descriptors / Indicators  Tender    Pain Type  Acute pain    Pain Onset  In the past 7 days    Pain Frequency  Intermittent    Aggravating Factors   walking     Pain Relieving Factors  no weight off prosthesis                       [facility] Adult PT Treatment/Exercise - 10/13/18 1315      Transfers   Sit to Stand  5: Supervision;With upper extremity assist;From chair/3-in-1  chairs without armrests to RW   Sit to Stand Details  Visual cues for safe use of DME/AE;Visual cues/gestures for sequencing;Verbal cues for technique    Stand to Sit  5: Supervision;With upper extremity assist;To chair/3-in-1   RW to chairs without armrests   Stand to Sit Details (indicate cue type and reason)  Visual cues for safe use of DME/AE;Verbal cues for technique      Ambulation/Gait   Ambulation/Gait  Yes    Ambulation/Gait Assistance  5: Supervision    Ambulation/Gait Assistance Details  demo, tactile & verbal cues on position to RW & upright posture to weight bear directly over prosthesis in stance    Ambulation Distance (Feet)  150 Feet   150' X 3   Assistive device  Rolling walker;Prosthesis    Gait Pattern  Step-through pattern;Decreased step length - right;Decreased stance time - left;Decreased stride length;Decreased hip/knee flexion - left;Decreased weight shift to left;Left hip hike;Left circumduction;Antalgic;Lateral hip instability;Trunk flexed;Abducted - left;Step-to pattern    Ambulation Surface  Indoor;Level    Stairs  Yes    Stairs Assistance  5: Supervision    Stairs Assistance Details (indicate cue type and reason)  demo &  instruction in technique with TTA prosthesis to facilitate weight bearing over prosthesis in stance and flexing prosthetic side knee to step up    Stair Management Technique  Two rails;Step to pattern;Forwards    Number of Stairs  4    Height of Stairs  6    Ramp  5: Supervision   RW & prosthesis   Ramp Details (indicate cue type and reason)  demo & verbal cues on upright posture and wt shift over prosthesis    Curb  5: Supervision   RW & prosthesis   Curb Details (indicate cue type and reason)  demo & verbal cues on upright posture and wt shift over prosthesis      Prosthetics   Prosthetic Care Comments   PT instructed with demo in donning liner with pin slightly posterior to pull soft tissue forward towards distal tibia and adjusting ply socks with too many (arrived with 8-ply which was too many today), too few (2-ply) and correct (5-ply). Based on limb volume size but can change with shrinkage over time & edema/fluid retention issues.     Current prosthetic wear tolerance (days/week)   daily    Current prosthetic wear tolerance (#hours/day)   6 hrs 2x/day    Residual limb condition   Slight edema at distal tibia area & Redness noted at knee and lateral area or resiudal limb.    Education Provided  Skin check;Correct ply sock adjustment;Proper Donning;Proper wear schedule/adjustment;Other (comment)   see prosthetic care comments   Person(s) Educated  Patient    Education Method  Explanation;Demonstration;Tactile cues;Verbal cues    Education Method  Verbalized understanding;Returned demonstration;Tactile cues required;Verbal cues required;Needs further instruction    Donning Prosthesis  Minimal assist    Doffing Prosthesis  Supervision               PT Short Term Goals - 10/13/18 1800      PT SHORT TERM GOAL #1   Title  Patient verbalizes adjusting ply socks with limb volume changes. (All Updated STGs Target Date: 10/30/2018)    Time  1    Period  Months    Status  On-going     Target Date  10/30/18      PT SHORT TERM GOAL #2   Title  Patient tolerates prosthesis  wear >12hrs total /day without skin issues.     Time  1    Period  Months    Status  On-going    Target Date  10/30/18      PT SHORT TERM GOAL #3   Title  Patient reaches 5" & to knee level without UE support with supervision.     Time  1    Period  Months    Status  On-going    Target Date  10/30/18      PT SHORT TERM GOAL #4   Title  Patient ambulates 400' with RW & prosthesis with supervision.     Time  1    Period  Months    Status  On-going    Target Date  10/30/18      PT SHORT TERM GOAL #5   Title  Patient ambulates 100' with cane & prosthesis with minA.     Time  1    Period  Months    Status  On-going    Target Date  10/30/18        PT Long Term Goals - 09/02/18 1800      PT LONG TERM GOAL #1   Title  Patient verbalizes & demonstrates proper prosthetic care including donning / doffing independently to enable safe use of prosthesis (All LTGs Target Date: 11/27/2018)    Time  3    Period  Months    Status  New    Target Date  11/27/18      PT LONG TERM GOAL #2   Title  Patient tolerates prosthesis wear daily for >90% of awake hours without skin or limb pain issues to enable function throughout his day.     Time  3    Period  Months    Status  New    Target Date  11/27/18      PT LONG TERM GOAL #3   Title  Berg Balance >36/56 to indicate lower fall risk & less dependency in standing ADLs.     Time  3    Period  Months    Status  New    Target Date  11/27/18      PT LONG TERM GOAL #4   Title  Patient ambulates 500' with RW (or rollator) & prosthesis outdoors on paved or grass up to 50' surfaces modified independent for community mobility.     Time  3    Period  Months    Status  New    Target Date  11/27/18      PT LONG TERM GOAL #5   Title  Patient negotiates ramps, curbs with walker & stairs 1 rail /cane modified independent for community access.     Time  3     Period  Months    Status  New    Target Date  11/27/18            Plan - 10/13/18 1800    Clinical Impression Statement  Today's skilled session focused on instructing in weight bearing thru prosthesis with upright posture (not behind with poor posture & wt shift and not inside with abduction) and proper adjustment of ply socks with his current limb volume.     Rehab Potential  Good    PT Frequency  2x / week    PT Duration  Other (comment)    PT Treatment/Interventions  ADLs/Self Care Home Management;Canalith Repostioning;DME Instruction;Gait training;Stair training;Functional mobility training;Therapeutic activities;Therapeutic exercise;Balance training;Neuromuscular re-education;Patient/family  education;Prosthetic Training;Manual techniques;Vestibular    PT Next Visit Plan  Continue to work on proper donning/doffing, gait with RW & LBQC and high level balance on compliant surfaces.     Consulted and Agree with Plan of Care  Patient       Patient will benefit from skilled therapeutic intervention in order to improve the following deficits and impairments:  Abnormal gait, Decreased activity tolerance, Decreased balance, Decreased endurance, Decreased knowledge of use of DME, Decreased mobility, Impaired flexibility, Decreased range of motion, Decreased strength, Decreased skin integrity, Dizziness, Postural dysfunction, Prosthetic Dependency  Visit Diagnosis: Other abnormalities of gait and mobility  Muscle weakness (generalized)  Unsteadiness on feet  Abnormal posture     Problem List Patient Active Problem List   Diagnosis Date Noted  . Controlled type 2 diabetes mellitus without complication ([place]) 82/99/3716  . Bradycardia 09/01/2018  . Gangrene of left foot ([place]) 05/28/2018  . 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 Obstructive sleep apnea syndrome 05/04/2018  . Obstructive sleep apnea treated with continuous positive airway  pressure (CPAP) 05/04/2018  . Type II diabetes mellitus, uncontrolled ([place]) 04/22/2018  . Atherosclerosis of native arteries of the extremities with gangrene ([place]) 04/09/2018  . Atherosclerosis of native artery of extremity ([place]) 04/09/2018  . Cellulitis in diabetic foot ([place]) 04/04/2018  . Paroxysmal atrial fibrillation ([place]) 12/05/2017  . Cardiac pacemaker in situ   . Symptomatic bradycardia   . Asystole ([place])   . 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 History of aortic valve replacement 12/02/2017  . Severe aortic stenosis   . Bilateral impacted cerumen 01/22/2016  . Essential hypertension 11/14/2015  . Peripheral vascular disease ([place]) 05/24/2014  . Chronic kidney disease 08/30/2013  . Hypothyroidism 08/30/2013  . 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 Diabetes mellitus type 2, uncomplicated ([place])   . Aortic valve stenosis 07/25/2011  . Hyperlipidemia 01/10/2011  . CAD (coronary artery disease) 01/10/2011  . Coronary arteriosclerosis 01/10/2011    Jamey Reas PT, DPT 10/14/2018, 12:10 PM  [facility] [address] [place], [name], [address] Phone: [phone]   Fax:  [phone]  Name: GRAYSIN LUCZYNSKI MRN: [id number] Date of Birth: Oct 09, 1941

## 2018-10-16 ENCOUNTER — Ambulatory Visit: Payer: Medicare Other

## 2018-10-16 DIAGNOSIS — R293 Abnormal posture: Secondary | ICD-10-CM

## 2018-10-16 DIAGNOSIS — R2681 Unsteadiness on feet: Secondary | ICD-10-CM

## 2018-10-16 DIAGNOSIS — M6281 Muscle weakness (generalized): Secondary | ICD-10-CM

## 2018-10-16 DIAGNOSIS — R2689 Other abnormalities of gait and mobility: Secondary | ICD-10-CM | POA: Diagnosis not present

## 2018-10-16 NOTE — Therapy (Cosign Needed)
This entire session was performed under direct supervision and direction of a licensed Brewing technologist . The therapist was present and actively participating. I have personally read, edited and approve of the note as written. Chassity Felts, PTA  [facility] [address] [place], [name], [address] Phone: [phone]   Fax:  [phone]  Physical Therapy Treatment  Patient Details  Name: Steven Maldonado MRN: [id number] Date of Birth: 10-Apr-1941 Referring Provider (PT): Mechele Claude, [place]   Encounter Date: 10/16/2018  PT End of Session - 10/16/18 1409    Visit Number  13    Number of Visits  26    Date for PT Re-Evaluation  12/02/17    Authorization Type  Medicare & Federal BCBS    Authorization Time Period  Medicare guidelines, Fed BCBS waves co-pay.  75 visit limit PT, OT & ST combined with 1 used.    Authorization - Number of Visits  75    PT Start Time  5072    PT Stop Time  1400    PT Time Calculation (min)  45 min    Equipment Utilized During Treatment  Gait belt    Activity Tolerance  Patient tolerated treatment well;Patient limited by pain    Behavior During Therapy  WFL for tasks assessed/performed       Past Medical History:  Diagnosis Date  . Chronic diastolic CHF (congestive heart failure) ([place])   . CKD (chronic kidney disease), stage III ([place])   . Constipation   . Coronary artery disease    a. Cath February 2012 in [facility], occluded RCA with collaterals  . DM type 2 (diabetes mellitus, type 2) ([place])   . Essential hypertension   . Hyperlipidemia   . Neuropathy    feet  . Pacemaker    a. symptomatic brady after TAVR s/p MDT PPM by Dr. Curt Bears 12/04/17  . Persistent atrial fibrillation   . PONV (postoperative nausea and vomiting)    after valve surgery  . PVD (peripheral vascular disease) ([place])    a. s/p R popliteal artery stenosis tx with drug-coated balloon 05/2014, followed by Dr. Fletcher Anon.  .  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 Severe aortic stenosis    a. 12/02/17: s/p TAVR  . Skin cancer   . Sleep apnea with use of continuous positive airway pressure (CPAP)    04-11-11 AHI was 32.9 and titrated to 15 cm H20, DME is [facility]  . Subclinical hypothyroidism     Past Surgical History:  Procedure Laterality Date  . ABDOMINAL ANGIOGRAM N/A 06/08/2014   Procedure: ABDOMINAL ANGIOGRAM;  Surgeon: Wellington Hampshire, MD;  Location: [facility] CATH LAB;  Service: Cardiovascular;  Laterality: N/A;  . ABDOMINAL AORTOGRAM N/A 04/09/2018   Procedure: ABDOMINAL AORTOGRAM;  Surgeon: Conrad Eden Roc, MD;  Location: [facility] CV LAB;  Service: Cardiovascular;  Laterality: N/A;  . AMPUTATION Left 04/17/2018   Procedure: LEFT FOOT 3RD RAY AMPUTATION;  Surgeon: Newt Minion, MD;  Location: [facility];  Service: Orthopedics;  Laterality: Left;  . AMPUTATION Left 05/28/2018   Procedure: LEFT AMPUTATION BELOW KNEE;  Surgeon: Wylene Simmer, MD;  Location: [facility];  Service: Orthopedics;  Laterality: Left;  . APPENDECTOMY  1965  . 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 CARDIAC CATHETERIZATION  12/2010  . CARDIOVERSION  07/2011  . CARDIOVERSION N/A 04/18/2014  Procedure: CARDIOVERSION;  Surgeon: Dorothy Spark, MD;  Location: [facility];  Service: Cardiovascular;  Laterality: N/A;  . CARDIOVERSION N/A 11/03/2015   Procedure: CARDIOVERSION;  Surgeon: Lelon Perla, MD;  Location: [facility] ENDOSCOPY;  Service: Cardiovascular;  Laterality: N/A;  . CARDIOVERSION N/A 05/08/2017   Procedure: CARDIOVERSION;  Surgeon: Dorothy Spark, MD;  Location: [facility] ENDOSCOPY;  Service: Cardiovascular;  Laterality: N/A;  . CARDIOVERSION N/A 07/28/2017   Procedure: CARDIOVERSION;  Surgeon: Dorothy Spark, MD;  Location: [facility];  Service: Cardiovascular;  Laterality: N/A;  . LOWER EXTREMITY ANGIOGRAM N/A  06/08/2014   Procedure: LOWER EXTREMITY ANGIOGRAM;  Surgeon: Wellington Hampshire, MD;  Location: [facility] CATH LAB;  Service: Cardiovascular;  Laterality: N/A;  . LOWER EXTREMITY ANGIOGRAPHY Left 04/09/2018   Procedure: Lower Extremity Angiography;  Surgeon: Conrad Ashton, MD;  Location: [facility] CV LAB;  Service: Cardiovascular;  Laterality: Left;  . PACEMAKER IMPLANT N/A 12/04/2017   Procedure: PACEMAKER IMPLANT;  Surgeon: Constance Haw, MD;  Location: [facility] CV LAB;  Service: Cardiovascular;  Laterality: N/A;  . PERIPHERAL VASCULAR BALLOON ANGIOPLASTY Left 04/09/2018   Procedure: PERIPHERAL VASCULAR BALLOON ANGIOPLASTY;  Surgeon: Conrad Haskell, MD;  Location: [facility] CV LAB;  Service: Cardiovascular;  Laterality: Left;  SFA  . POPLITEAL ARTERY ANGIOPLASTY Right 06/08/2014   Archie Endo 06/08/2014  . RIGHT/LEFT HEART CATH AND CORONARY ANGIOGRAPHY N/A 10/08/2017   Procedure: RIGHT/LEFT HEART CATH AND CORONARY ANGIOGRAPHY;  Surgeon: Burnell Blanks, MD;  Location: [facility] CV LAB;  Service: Cardiovascular;  Laterality: N/A;  . SKIN CANCER EXCISION Bilateral    "have had them cut off back of neck X 2; off left upper arm; right wrist, near right shoulder blade" (06/08/2014)  . TEE WITHOUT CARDIOVERSION N/A 12/02/2017   Procedure: TRANSESOPHAGEAL ECHOCARDIOGRAM (TEE);  Surgeon: Burnell Blanks, MD;  Location: [facility];  Service: Open Heart Surgery;  Laterality: N/A;  . TEMPORARY PACEMAKER N/A 12/04/2017   Procedure: TEMPORARY PACEMAKER;  Surgeon: Leonie Man, MD;  Location: [facility] CV LAB;  Service: Cardiovascular;  Laterality: N/A;  . TRANSCATHETER AORTIC VALVE REPLACEMENT, TRANSFEMORAL N/A 12/02/2017   Procedure: TRANSCATHETER AORTIC VALVE REPLACEMENT, TRANSFEMORAL;  Surgeon: Burnell Blanks, MD;  Location: [facility];  Service: Open Heart Surgery;  Laterality: N/A;  using Edwards Sapien 3 Transcatheter Heart Valve size 51mm    There were no vitals filed for this  visit.  Subjective Assessment - 10/16/18 1320    Subjective  Went to [facility] Tuesday for alignment and added a bubble to front to release pressure on shin. Has been resting the past two days and staying off of prosthesis.     Patient is accompained by:  Family member    Pertinent History  L TTA, CAD, PAF,  pacemaker, DM2, neuropathy, CKD, HTN,     Limitations  Lifting;Standing;Walking;House hold activities    Patient Stated Goals  To use prosthesis in community, travel (uses [employer]), take of himself so wife does not have to do it. Housework.     Currently in Pain?  Yes    Pain Score  3     Pain Orientation  Left;Distal    Pain Descriptors / Indicators  Tender    Pain Onset  In the past 7 days    Pain Frequency  Intermittent    Aggravating Factors   weight bearing         [facility] Adult PT Treatment/Exercise - 10/16/18 1324      Ambulation/Gait  Ambulation/Gait  Yes    Ambulation/Gait Assistance  5: Supervision    Ambulation/Gait Assistance Details  Pt ambulates after taking socks off with decreased pain on shin.     Ambulation Distance (Feet)  115 Feet    Assistive device  Rolling walker    Gait Pattern  Step-through pattern;Decreased step length - right;Decreased stance time - left;Decreased stride length;Decreased hip/knee flexion - left;Decreased weight shift to left;Antalgic;Trunk flexed;Step-to pattern    Ambulation Surface  Level;Indoor      Prosthetics   Prosthetic Care Comments   Pt instructed to hold pin when donning prosthesis and keep residual limb elevated when sitting at desk. Pt experienced increased pain on anterior aspect of residual limb, resolved with taking 3 ply sock off and realigning liner.      Current prosthetic wear tolerance (days/week)   daily     Current prosthetic wear tolerance (#hours/day)   wearing 10-11 hours with 2 hr break     Residual limb condition   Redness noted at lateral aspect of residual limb    Education Provided  Skin check;Correct ply sock  adjustment;Proper Donning;Proper wear schedule/adjustment    Person(s) Educated  Patient    Education Method  Explanation;Demonstration;Tactile cues;Verbal cues    Education Method  Verbalized understanding;Returned demonstration;Tactile cues required;Verbal cues required;Needs further instruction    Donning Prosthesis  Minimal assist    Doffing Prosthesis  Supervision               PT Short Term Goals - 10/13/18 1800      PT SHORT TERM GOAL #1   Title  Patient verbalizes adjusting ply socks with limb volume changes. (All Updated STGs Target Date: 10/30/2018)    Time  1    Period  Months    Status  On-going    Target Date  10/30/18      PT SHORT TERM GOAL #2   Title  Patient tolerates prosthesis wear >12hrs total /day without skin issues.     Time  1    Period  Months    Status  On-going    Target Date  10/30/18      PT SHORT TERM GOAL #3   Title  Patient reaches 5" & to knee level without UE support with supervision.     Time  1    Period  Months    Status  On-going    Target Date  10/30/18      PT SHORT TERM GOAL #4   Title  Patient ambulates 400' with RW & prosthesis with supervision.     Time  1    Period  Months    Status  On-going    Target Date  10/30/18      PT SHORT TERM GOAL #5   Title  Patient ambulates 100' with cane & prosthesis with minA.     Time  1    Period  Months    Status  On-going    Target Date  10/30/18        PT Long Term Goals - 09/02/18 1800      PT LONG TERM GOAL #1   Title  Patient verbalizes & demonstrates proper prosthetic care including donning / doffing independently to enable safe use of prosthesis (All LTGs Target Date: 11/27/2018)    Time  3    Period  Months    Status  New    Target Date  11/27/18      PT LONG TERM  GOAL #2   Title  Patient tolerates prosthesis wear daily for >90% of awake hours without skin or limb pain issues to enable function throughout his day.     Time  3    Period  Months    Status  New     Target Date  11/27/18      PT LONG TERM GOAL #3   Title  Berg Balance >36/56 to indicate lower fall risk & less dependency in standing ADLs.     Time  3    Period  Months    Status  New    Target Date  11/27/18      PT LONG TERM GOAL #4   Title  Patient ambulates 500' with RW (or rollator) & prosthesis outdoors on paved or grass up to 50' surfaces modified independent for community mobility.     Time  3    Period  Months    Status  New    Target Date  11/27/18      PT LONG TERM GOAL #5   Title  Patient negotiates ramps, curbs with walker & stairs 1 rail /cane modified independent for community access.     Time  3    Period  Months    Status  New    Target Date  11/27/18            Plan - 10/16/18 1418    Clinical Impression Statement  Today's treatment focused on instructing and demonstrating proper adjustment of ply socks and proper donning of prosthesis. Remainder of session focused on gait training with RW when limb pain was resolved. Pt continues to have difficulty with donning gel liner properly. Pt would benefit from further PT to improve independence and functional mobility with prosthesis.     Rehab Potential  Good    PT Frequency  2x / week    PT Duration  Other (comment)    PT Treatment/Interventions  ADLs/Self Care Home Management;Canalith Repostioning;DME Instruction;Gait training;Stair training;Functional mobility training;Therapeutic activities;Therapeutic exercise;Balance training;Neuromuscular re-education;Patient/family education;Prosthetic Training;Manual techniques;Vestibular    PT Next Visit Plan  Continue to work on proper donning/doffing, gait with RW & LBQC and high level balance on compliant surfaces.     Consulted and Agree with Plan of Care  Patient       Patient will benefit from skilled therapeutic intervention in order to improve the following deficits and impairments:  Abnormal gait, Decreased activity tolerance, Decreased balance, Decreased  endurance, Decreased knowledge of use of DME, Decreased mobility, Impaired flexibility, Decreased range of motion, Decreased strength, Decreased skin integrity, Dizziness, Postural dysfunction, Prosthetic Dependency  Visit Diagnosis: Other abnormalities of gait and mobility  Muscle weakness (generalized)  Unsteadiness on feet  Abnormal posture     Problem List Patient Active Problem List   Diagnosis Date Noted  . Controlled type 2 diabetes mellitus without complication ([place]) 96/29/5284  . Bradycardia 09/01/2018  . Gangrene of left foot ([place]) 05/28/2018  . 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 Obstructive sleep apnea syndrome 05/04/2018  . Obstructive sleep apnea treated with continuous positive airway pressure (CPAP) 05/04/2018  . Type II diabetes mellitus, uncontrolled ([place]) 04/22/2018  . Atherosclerosis of native arteries of the extremities with gangrene ([place]) 04/09/2018  . Atherosclerosis of native artery of extremity ([place]) 04/09/2018  . Cellulitis in diabetic foot ([place]) 04/04/2018  . Paroxysmal atrial fibrillation ([place]) 12/05/2017  . Cardiac pacemaker in situ   . Symptomatic bradycardia   .  Asystole ([place])   . 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 History of aortic valve replacement 12/02/2017  . Severe aortic stenosis   . Bilateral impacted cerumen 01/22/2016  . Essential hypertension 11/14/2015  . Peripheral vascular disease ([place]) 05/24/2014  . Chronic kidney disease 08/30/2013  . Hypothyroidism 08/30/2013  . 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 Diabetes mellitus type 2, uncomplicated ([place])   . Aortic valve stenosis 07/25/2011  . Hyperlipidemia 01/10/2011  . CAD (coronary artery disease) 01/10/2011  . Coronary arteriosclerosis 01/10/2011   Chassity Felts, PTA  Cecile Sheerer, SPTA 10/16/2018, 4:10 PM  [facility] [address] [place], [name], [address] Phone: [phone]   Fax:  [phone]  Name: TREA LATNER MRN: [id number] Date of Birth: 03-Mar-1941

## 2018-10-20 ENCOUNTER — Ambulatory Visit: Payer: Medicare Other

## 2018-10-20 DIAGNOSIS — R2689 Other abnormalities of gait and mobility: Secondary | ICD-10-CM | POA: Diagnosis not present

## 2018-10-20 DIAGNOSIS — R2681 Unsteadiness on feet: Secondary | ICD-10-CM | POA: Diagnosis not present

## 2018-10-20 DIAGNOSIS — M6281 Muscle weakness (generalized): Secondary | ICD-10-CM | POA: Diagnosis not present

## 2018-10-20 DIAGNOSIS — R293 Abnormal posture: Secondary | ICD-10-CM | POA: Diagnosis not present

## 2018-10-20 NOTE — Therapy (Cosign Needed)
This entire session was performed under direct supervision and direction of a licensed Brewing technologist . The therapist was present and actively participating. I have personally read, edited and approve of the note as written. Chassity Felts, PTA  [facility] [address] [place], [name], [address] Phone: [phone]   Fax:  [phone]  Physical Therapy Treatment  Patient Details  Name: Steven Maldonado MRN: [id number] Date of Birth: 1941-06-14 Referring Provider (PT): Mechele Claude, [place]   Encounter Date: 10/20/2018  PT End of Session - 10/20/18 1423    Visit Number  14    Number of Visits  26    Date for PT Re-Evaluation  12/02/17    Authorization Type  Medicare & Federal BCBS    Authorization Time Period  Medicare guidelines, Fed BCBS waves co-pay.  75 visit limit PT, OT & ST combined with 1 used.    Authorization - Number of Visits  75    PT Start Time  1316    PT Stop Time  1402    PT Time Calculation (min)  46 min    Equipment Utilized During Treatment  Gait belt    Activity Tolerance  Patient tolerated treatment well;Patient limited by pain    Behavior During Therapy  WFL for tasks assessed/performed       Past Medical History:  Diagnosis Date  . Chronic diastolic CHF (congestive heart failure) ([place])   . CKD (chronic kidney disease), stage III ([place])   . Constipation   . Coronary artery disease    a. Cath February 2012 in [facility], occluded RCA with collaterals  . DM type 2 (diabetes mellitus, type 2) ([place])   . Essential hypertension   . Hyperlipidemia   . Neuropathy    feet  . Pacemaker    a. symptomatic brady after TAVR s/p MDT PPM by Dr. Curt Bears 12/04/17  . Persistent atrial fibrillation   . PONV (postoperative nausea and vomiting)    after valve surgery  . PVD (peripheral vascular disease) ([place])    a. s/p R popliteal artery stenosis tx with drug-coated balloon 05/2014, followed by Dr. Fletcher Anon.  .  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 Severe aortic stenosis    a. 12/02/17: s/p TAVR  . Skin cancer   . Sleep apnea with use of continuous positive airway pressure (CPAP)    04-11-11 AHI was 32.9 and titrated to 15 cm H20, DME is [facility]  . Subclinical hypothyroidism     Past Surgical History:  Procedure Laterality Date  . ABDOMINAL ANGIOGRAM N/A 06/08/2014   Procedure: ABDOMINAL ANGIOGRAM;  Surgeon: Wellington Hampshire, MD;  Location: [facility] CATH LAB;  Service: Cardiovascular;  Laterality: N/A;  . ABDOMINAL AORTOGRAM N/A 04/09/2018   Procedure: ABDOMINAL AORTOGRAM;  Surgeon: Conrad Shirley, MD;  Location: [facility] CV LAB;  Service: Cardiovascular;  Laterality: N/A;  . AMPUTATION Left 04/17/2018   Procedure: LEFT FOOT 3RD RAY AMPUTATION;  Surgeon: Newt Minion, MD;  Location: [facility];  Service: Orthopedics;  Laterality: Left;  . AMPUTATION Left 05/28/2018   Procedure: LEFT AMPUTATION BELOW KNEE;  Surgeon: Wylene Simmer, MD;  Location: [facility];  Service: Orthopedics;  Laterality: Left;  . APPENDECTOMY  1965  . 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 CARDIAC CATHETERIZATION  12/2010  . CARDIOVERSION  07/2011  . CARDIOVERSION N/A 04/18/2014  Procedure: CARDIOVERSION;  Surgeon: Dorothy Spark, MD;  Location: [facility];  Service: Cardiovascular;  Laterality: N/A;  . CARDIOVERSION N/A 11/03/2015   Procedure: CARDIOVERSION;  Surgeon: Lelon Perla, MD;  Location: [facility] ENDOSCOPY;  Service: Cardiovascular;  Laterality: N/A;  . CARDIOVERSION N/A 05/08/2017   Procedure: CARDIOVERSION;  Surgeon: Dorothy Spark, MD;  Location: [facility] ENDOSCOPY;  Service: Cardiovascular;  Laterality: N/A;  . CARDIOVERSION N/A 07/28/2017   Procedure: CARDIOVERSION;  Surgeon: Dorothy Spark, MD;  Location: [facility];  Service: Cardiovascular;  Laterality: N/A;  . LOWER EXTREMITY ANGIOGRAM N/A  06/08/2014   Procedure: LOWER EXTREMITY ANGIOGRAM;  Surgeon: Wellington Hampshire, MD;  Location: [facility] CATH LAB;  Service: Cardiovascular;  Laterality: N/A;  . LOWER EXTREMITY ANGIOGRAPHY Left 04/09/2018   Procedure: Lower Extremity Angiography;  Surgeon: Conrad [place], MD;  Location: [facility] CV LAB;  Service: Cardiovascular;  Laterality: Left;  . PACEMAKER IMPLANT N/A 12/04/2017   Procedure: PACEMAKER IMPLANT;  Surgeon: Constance Haw, MD;  Location: [facility] CV LAB;  Service: Cardiovascular;  Laterality: N/A;  . PERIPHERAL VASCULAR BALLOON ANGIOPLASTY Left 04/09/2018   Procedure: PERIPHERAL VASCULAR BALLOON ANGIOPLASTY;  Surgeon: Conrad Carp[place]ia, MD;  Location: [facility] CV LAB;  Service: Cardiovascular;  Laterality: Left;  SFA  . POPLITEAL ARTERY ANGIOPLASTY Right 06/08/2014   Archie Endo 06/08/2014  . RIGHT/LEFT HEART CATH AND CORONARY ANGIOGRAPHY N/A 10/08/2017   Procedure: RIGHT/LEFT HEART CATH AND CORONARY ANGIOGRAPHY;  Surgeon: Burnell Blanks, MD;  Location: [facility] CV LAB;  Service: Cardiovascular;  Laterality: N/A;  . SKIN CANCER EXCISION Bilateral    "have had them cut off back of neck X 2; off left upper arm; right wrist, near right shoulder blade" (06/08/2014)  . TEE WITHOUT CARDIOVERSION N/A 12/02/2017   Procedure: TRANSESOPHAGEAL ECHOCARDIOGRAM (TEE);  Surgeon: Burnell Blanks, MD;  Location: [facility];  Service: Open Heart Surgery;  Laterality: N/A;  . TEMPORARY PACEMAKER N/A 12/04/2017   Procedure: TEMPORARY PACEMAKER;  Surgeon: Leonie Man, MD;  Location: [facility] CV LAB;  Service: Cardiovascular;  Laterality: N/A;  . TRANSCATHETER AORTIC VALVE REPLACEMENT, TRANSFEMORAL N/A 12/02/2017   Procedure: TRANSCATHETER AORTIC VALVE REPLACEMENT, TRANSFEMORAL;  Surgeon: Burnell Blanks, MD;  Location: [facility];  Service: Open Heart Surgery;  Laterality: N/A;  using Edwards Sapien 3 Transcatheter Heart Valve size 72mm    There were no vitals filed for this  visit.  Subjective Assessment - 10/20/18 1320    Subjective  Pt C/O pain on shin bone and lateral aspect of residual limb for the past two days. Hasn't been able to walk without pain. Pt and wife scheduled appointment at [facility] at 3:00 today for possible realignment of prosthesis.     Patient is accompained by:  Family member    Pertinent History  L TTA, CAD, PAF,  pacemaker, DM2, neuropathy, CKD, HTN,     Limitations  Lifting;Standing;Walking;House hold activities    Patient Stated Goals  To use prosthesis in community, travel (uses [employer] Lei), take of himself so wife does not have to do it. Housework.     Currently in Pain?  Yes    Pain Location  Leg    Pain Orientation  Left;Lateral;Distal    Pain Descriptors / Indicators  Tender    Pain Type  Acute pain    Pain Onset  1 to 4 weeks ago    Pain Frequency  Intermittent    Aggravating Factors   weight bearing    Pain Relieving Factors  no weight on prosthesis        [facility] Adult PT Treatment/Exercise - 10/20/18 1324      Exercises   Other Exercises   Core activation exercises on mat: Supine alternating marches; dead bug; quadraped alternating kick backs; alternating arm lifts. Verbal cues for proper exercise technique and to remain breathing.       Modalities   Modalities  Cryotherapy   Ice massage for 5 minutes to residual limb to reduce pain     Prosthetics   Prosthetic Care Comments   Pt comes in with pain on residual limb with weight bearing. Reviewed benefits of ice massage to residual limb, Prosthesis socket appears to be tilted slighty possibly causes excessive pain on lateral aspect of residual limb.     Current prosthetic wear tolerance (days/week)   daily     Current prosthetic wear tolerance (#hours/day)   Has been wearing but unable to walk frequently due to excessive pain.     Residual limb condition   Redness noted on shin bone and at lateral aspect of residual limb    Education Provided  Residual limb care;Correct ply sock  adjustment;Proper Donning    Person(s) Educated  Patient;Spouse    Education Method  Explanation;Demonstration;Tactile cues;Verbal cues    Education Method  Verbalized understanding;Returned demonstration;Tactile cues required;Verbal cues required;Needs further instruction    Donning Prosthesis  Supervision;Minimal assist    Doffing Prosthesis  Supervision             PT Education - 10/20/18 1410    Education Details  Reviewed benefits on ice massage to irritated areas. Recommened to continue doing seated home exercises to avoid excessive pain.     Person(s) Educated  Patient;Spouse    Methods  Explanation;Demonstration    Comprehension  Verbalized understanding;Returned demonstration       PT Short Term Goals - 10/13/18 1800      PT SHORT TERM GOAL #1   Title  Patient verbalizes adjusting ply socks with limb volume changes. (All Updated STGs Target Date: 10/30/2018)    Time  1    Period  Months    Status  On-going    Target Date  10/30/18      PT SHORT TERM GOAL #2   Title  Patient tolerates prosthesis wear >12hrs total /day without skin issues.     Time  1    Period  Months    Status  On-going    Target Date  10/30/18      PT SHORT TERM GOAL #3   Title  Patient reaches 5" & to knee level without UE support with supervision.     Time  1    Period  Months    Status  On-going    Target Date  10/30/18      PT SHORT TERM GOAL #4   Title  Patient ambulates 400' with RW & prosthesis with supervision.     Time  1    Period  Months    Status  On-going    Target Date  10/30/18      PT SHORT TERM GOAL #5   Title  Patient ambulates 100' with cane & prosthesis with minA.     Time  1    Period  Months    Status  On-going    Target Date  10/30/18        PT Long Term Goals - 09/02/18 1800      PT LONG TERM GOAL #  1   Title  Patient verbalizes & demonstrates proper prosthetic care including donning / doffing independently to enable safe use of prosthesis (All LTGs  Target Date: 11/27/2018)    Time  3    Period  Months    Status  New    Target Date  11/27/18      PT LONG TERM GOAL #2   Title  Patient tolerates prosthesis wear daily for >90% of awake hours without skin or limb pain issues to enable function throughout his day.     Time  3    Period  Months    Status  New    Target Date  11/27/18      PT LONG TERM GOAL #3   Title  Berg Balance >36/56 to indicate lower fall risk & less dependency in standing ADLs.     Time  3    Period  Months    Status  New    Target Date  11/27/18      PT LONG TERM GOAL #4   Title  Patient ambulates 500' with RW (or rollator) & prosthesis outdoors on paved or grass up to 50' surfaces modified independent for community mobility.     Time  3    Period  Months    Status  New    Target Date  11/27/18      PT LONG TERM GOAL #5   Title  Patient negotiates ramps, curbs with walker & stairs 1 rail /cane modified independent for community access.     Time  3    Period  Months    Status  New    Target Date  11/27/18            Plan - 10/20/18 1431    Clinical Impression Statement  Pt continues to complain of residual limb pain with weight bearing. Today's treatment focused on attempting to alleviate residual limb pain by adjusting liner and prosthesis with little relief reported. Pt going to [facility] after appointment for possible realignment. Remainder of session focused on core activation exercises on mat. Pt would benefit from further PT to improve independence and functional mobility with prosthesis.     Rehab Potential  Good    PT Frequency  2x / week    PT Duration  Other (comment)    PT Treatment/Interventions  ADLs/Self Care Home Management;Canalith Repostioning;DME Instruction;Gait training;Stair training;Functional mobility training;Therapeutic activities;Therapeutic exercise;Balance training;Neuromuscular re-education;Patient/family education;Prosthetic Training;Manual techniques;Vestibular    PT Next  Visit Plan  Continue to work on proper donning/doffing, gait with RW & LBQC and high level balance on compliant surfaces.     Consulted and Agree with Plan of Care  Patient       Patient will benefit from skilled therapeutic intervention in order to improve the following deficits and impairments:  Abnormal gait, Decreased activity tolerance, Decreased balance, Decreased endurance, Decreased knowledge of use of DME, Decreased mobility, Impaired flexibility, Decreased range of motion, Decreased strength, Decreased skin integrity, Dizziness, Postural dysfunction, Prosthetic Dependency  Visit Diagnosis: Other abnormalities of gait and mobility  Muscle weakness (generalized)  Unsteadiness on feet     Problem List Patient Active Problem List   Diagnosis Date Noted  . Controlled type 2 diabetes mellitus without complication ([place]) 72/53/6644  . Bradycardia 09/01/2018  . Gangrene of left foot ([place]) 05/28/2018  . 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 Obstructive sleep apnea syndrome 05/04/2018  . Obstructive sleep apnea treated  with continuous positive airway pressure (CPAP) 05/04/2018  . Type II diabetes mellitus, uncontrolled ([place]) 04/22/2018  . Atherosclerosis of native arteries of the extremities with gangrene ([place]) 04/09/2018  . Atherosclerosis of native artery of extremity ([place]) 04/09/2018  . Cellulitis in diabetic foot ([place]) 04/04/2018  . Paroxysmal atrial fibrillation ([place]) 12/05/2017  . Cardiac pacemaker in situ   . Symptomatic bradycardia   . Asystole ([place])   . 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 History of aortic valve replacement 12/02/2017  . Severe aortic stenosis   . Bilateral impacted cerumen 01/22/2016  . Essential hypertension 11/14/2015  . Peripheral vascular disease ([place]) 05/24/2014  . Chronic kidney disease 08/30/2013  . Hypothyroidism 08/30/2013  . Obesity with body mass index greater than 30  07/15/2013  . Diabetes mellitus type 2, uncomplicated ([place])   . Aortic valve stenosis 07/25/2011  . Hyperlipidemia 01/10/2011  . CAD (coronary artery disease) 01/10/2011  . Coronary arteriosclerosis 01/10/2011   Chassity Felts, PTA  Cecile Sheerer, SPTA 10/20/2018, 2:51 PM  [facility] [address] [place], [name], [address] Phone: [phone]   Fax:  [phone]  Name: Steven Maldonado MRN: [id number] Date of Birth: 1941/02/21

## 2018-10-23 ENCOUNTER — Ambulatory Visit: Payer: Medicare Other

## 2018-10-23 DIAGNOSIS — R2689 Other abnormalities of gait and mobility: Secondary | ICD-10-CM

## 2018-10-23 DIAGNOSIS — R2681 Unsteadiness on feet: Secondary | ICD-10-CM | POA: Diagnosis not present

## 2018-10-23 DIAGNOSIS — R293 Abnormal posture: Secondary | ICD-10-CM | POA: Diagnosis not present

## 2018-10-23 DIAGNOSIS — M6281 Muscle weakness (generalized): Secondary | ICD-10-CM | POA: Diagnosis not present

## 2018-10-24 NOTE — Therapy (Signed)
[facility] [address] [place], [name], [address] Phone: [phone]   Fax:  [phone]  Physical Therapy Treatment  Patient Details  Name: Steven Maldonado MRN: [id number] Date of Birth: Dec 26, 1940 Referring Provider (PT): Mechele Claude, [place]   Encounter Date: 10/23/2018  PT End of Session - 10/23/18 1325    Visit Number  15    Number of Visits  26    Date for PT Re-Evaluation  12/02/17    Authorization Type  Medicare & Federal BCBS    Authorization Time Period  Medicare guidelines, Fed BCBS waves co-pay.  75 visit limit PT, OT & ST combined with 1 used.    Authorization - Number of Visits  75    PT Start Time  6546    PT Stop Time  1402    PT Time Calculation (min)  47 min    Equipment Utilized During Treatment  Gait belt    Activity Tolerance  Patient tolerated treatment well;Patient limited by pain    Behavior During Therapy  WFL for tasks assessed/performed       Past Medical History:  Diagnosis Date  . Chronic diastolic CHF (congestive heart failure) ([place])   . CKD (chronic kidney disease), stage III ([place])   . Constipation   . Coronary artery disease    a. Cath February 2012 in [facility], occluded RCA with collaterals  . DM type 2 (diabetes mellitus, type 2) ([place])   . Essential hypertension   . Hyperlipidemia   . Neuropathy    feet  . Pacemaker    a. symptomatic brady after TAVR s/p MDT PPM by Dr. Curt Bears 12/04/17  . Persistent atrial fibrillation   . PONV (postoperative nausea and vomiting)    after valve surgery  . PVD (peripheral vascular disease) ([place])    a. s/p R popliteal artery stenosis tx with drug-coated balloon 05/2014, followed by Dr. Fletcher Anon.  . 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 Severe aortic stenosis    a. 12/02/17: s/p TAVR  . Skin cancer   . Sleep apnea  with use of continuous positive airway pressure (CPAP)    04-11-11 AHI was 32.9 and titrated to 15 cm H20, DME is [facility]  . Subclinical hypothyroidism     Past Surgical History:  Procedure Laterality Date  . ABDOMINAL ANGIOGRAM N/A 06/08/2014   Procedure: ABDOMINAL ANGIOGRAM;  Surgeon: Wellington Hampshire, MD;  Location: [facility] CATH LAB;  Service: Cardiovascular;  Laterality: N/A;  . ABDOMINAL AORTOGRAM N/A 04/09/2018   Procedure: ABDOMINAL AORTOGRAM;  Surgeon: Conrad [place]mont, MD;  Location: [facility] CV LAB;  Service: Cardiovascular;  Laterality: N/A;  . AMPUTATION Left 04/17/2018   Procedure: LEFT FOOT 3RD RAY AMPUTATION;  Surgeon: Newt Minion, MD;  Location: [facility];  Service: Orthopedics;  Laterality: Left;  . AMPUTATION Left 05/28/2018   Procedure: LEFT AMPUTATION BELOW KNEE;  Surgeon: Wylene Simmer, MD;  Location: [facility];  Service: Orthopedics;  Laterality: Left;  . APPENDECTOMY  1965  . 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 CARDIAC CATHETERIZATION  12/2010  . CARDIOVERSION  07/2011  . CARDIOVERSION N/A 04/18/2014   Procedure: CARDIOVERSION;  Surgeon: Dorothy Spark, MD;  Location: [facility] ENDOSCOPY;  Service: Cardiovascular;  Laterality: N/A;  . CARDIOVERSION N/A 11/03/2015   Procedure: CARDIOVERSION;  Surgeon: Lelon Perla, MD;  Location: [facility];  Service: Cardiovascular;  Laterality: N/A;  . CARDIOVERSION N/A 05/08/2017   Procedure: CARDIOVERSION;  Surgeon: Dorothy Spark, MD;  Location: [facility] ENDOSCOPY;  Service: Cardiovascular;  Laterality: N/A;  . CARDIOVERSION N/A 07/28/2017   Procedure: CARDIOVERSION;  Surgeon: Dorothy Spark, MD;  Location: [facility];  Service: Cardiovascular;  Laterality: N/A;  . LOWER EXTREMITY ANGIOGRAM N/A 06/08/2014   Procedure: LOWER EXTREMITY ANGIOGRAM;  Surgeon: Wellington Hampshire, MD;  Location: [facility] CATH LAB;  Service: Cardiovascular;  Laterality: N/A;  . LOWER EXTREMITY ANGIOGRAPHY Left 04/09/2018   Procedure: Lower Extremity Angiography;  Surgeon:  Conrad Lown[place]boro, MD;  Location: [facility] CV LAB;  Service: Cardiovascular;  Laterality: Left;  . PACEMAKER IMPLANT N/A 12/04/2017   Procedure: PACEMAKER IMPLANT;  Surgeon: Constance Haw, MD;  Location: [facility] CV LAB;  Service: Cardiovascular;  Laterality: N/A;  . PERIPHERAL VASCULAR BALLOON ANGIOPLASTY Left 04/09/2018   Procedure: PERIPHERAL VASCULAR BALLOON ANGIOPLASTY;  Surgeon: Conrad Bristol, MD;  Location: [facility] CV LAB;  Service: Cardiovascular;  Laterality: Left;  SFA  . POPLITEAL ARTERY ANGIOPLASTY Right 06/08/2014   Archie Endo 06/08/2014  . RIGHT/LEFT HEART CATH AND CORONARY ANGIOGRAPHY N/A 10/08/2017   Procedure: RIGHT/LEFT HEART CATH AND CORONARY ANGIOGRAPHY;  Surgeon: Burnell Blanks, MD;  Location: [facility] CV LAB;  Service: Cardiovascular;  Laterality: N/A;  . SKIN CANCER EXCISION Bilateral    "have had them cut off back of neck X 2; off left upper arm; right wrist, near right shoulder blade" (06/08/2014)  . TEE WITHOUT CARDIOVERSION N/A 12/02/2017   Procedure: TRANSESOPHAGEAL ECHOCARDIOGRAM (TEE);  Surgeon: Burnell Blanks, MD;  Location: [facility];  Service: Open Heart Surgery;  Laterality: N/A;  . TEMPORARY PACEMAKER N/A 12/04/2017   Procedure: TEMPORARY PACEMAKER;  Surgeon: Leonie Man, MD;  Location: [facility] CV LAB;  Service: Cardiovascular;  Laterality: N/A;  . TRANSCATHETER AORTIC VALVE REPLACEMENT, TRANSFEMORAL N/A 12/02/2017   Procedure: TRANSCATHETER AORTIC VALVE REPLACEMENT, TRANSFEMORAL;  Surgeon: Burnell Blanks, MD;  Location: [facility];  Service: Open Heart Surgery;  Laterality: N/A;  using Edwards Sapien 3 Transcatheter Heart Valve size 63mm    There were no vitals filed for this visit.  Subjective Assessment - 10/23/18 1321    Subjective  [facility] went to [facility] after last session and this morning, was informed that he may have a stitch that never came out or a knot underneath the skin, is having a test done next week to see for sure,  may need another prosthesis.     Patient is accompained by:  Family member    Pertinent History  L TTA, CAD, PAF,  pacemaker, DM2, neuropathy, CKD, HTN,     Limitations  Lifting;Standing;Walking;House hold activities    Patient Stated Goals  To use prosthesis in community, travel (uses [facility]), take of himself so wife does not have to do it. Housework.     Currently in Pain?  Yes    Pain Score  2     Pain Location  Leg    Pain Orientation  Left;Lateral;Distal    Pain Descriptors / Indicators  Tender    Pain Type  Acute pain    Pain Onset  1 to 4 weeks ago    Pain Frequency  Intermittent         [facility] Adult PT Treatment/Exercise - 10/24/18 0001      Ambulation/Gait   Ambulation/Gait  Yes    Ambulation/Gait Assistance  5: Supervision;4: Min guard    Ambulation/Gait Assistance  Details  Initial lap with RW working on progressing from step to-pattern to a step through-pattern while decreasing BUE reliance, pt then walking in parallel bars with no AD/bil hand rails focusing on weight shift in prosthesis and posture to decrease pain on limb, final lap attempted to ambulate with [facility] but unable due to increased pain in residual limb    Ambulation Distance (Feet)  115 Feet   x2   Assistive device  Rolling walker;Large base quad cane;Prosthesis;None    Gait Pattern  Step-through pattern;Decreased step length - right;Decreased stance time - left;Decreased stride length;Decreased hip/knee flexion - left;Decreased weight shift to left;Antalgic;Trunk flexed;Step-to pattern    Ambulation Surface  Level;Indoor      Knee/Hip Exercises: Aerobic   Nustep  L3, 58mins               PT Short Term Goals - 10/13/18 1800      PT SHORT TERM GOAL #1   Title  Patient verbalizes adjusting ply socks with limb volume changes. (All Updated STGs Target Date: 10/30/2018)    Time  1    Period  Months    Status  On-going    Target Date  10/30/18      PT SHORT TERM GOAL #2   Title  Patient tolerates  prosthesis wear >12hrs total /day without skin issues.     Time  1    Period  Months    Status  On-going    Target Date  10/30/18      PT SHORT TERM GOAL #3   Title  Patient reaches 5" & to knee level without UE support with supervision.     Time  1    Period  Months    Status  On-going    Target Date  10/30/18      PT SHORT TERM GOAL #4   Title  Patient ambulates 400' with RW & prosthesis with supervision.     Time  1    Period  Months    Status  On-going    Target Date  10/30/18      PT SHORT TERM GOAL #5   Title  Patient ambulates 100' with cane & prosthesis with minA.     Time  1    Period  Months    Status  On-going    Target Date  10/30/18        PT Long Term Goals - 09/02/18 1800      PT LONG TERM GOAL #1   Title  Patient verbalizes & demonstrates proper prosthetic care including donning / doffing independently to enable safe use of prosthesis (All LTGs Target Date: 11/27/2018)    Time  3    Period  Months    Status  New    Target Date  11/27/18      PT LONG TERM GOAL #2   Title  Patient tolerates prosthesis wear daily for >90% of awake hours without skin or limb pain issues to enable function throughout his day.     Time  3    Period  Months    Status  New    Target Date  11/27/18      PT LONG TERM GOAL #3   Title  Berg Balance >36/56 to indicate lower fall risk & less dependency in standing ADLs.     Time  3    Period  Months    Status  New    Target Date  11/27/18  PT LONG TERM GOAL #4   Title  Patient ambulates 500' with RW (or rollator) & prosthesis outdoors on paved or grass up to 50' surfaces modified independent for community mobility.     Time  3    Period  Months    Status  New    Target Date  11/27/18      PT LONG TERM GOAL #5   Title  Patient negotiates ramps, curbs with walker & stairs 1 rail /cane modified independent for community access.     Time  3    Period  Months    Status  New    Target Date  11/27/18             Plan - 10/24/18 1001    Clinical Impression Statement  Todays skilled session focused on gait training with limitations due to increased pain in residual limb and Nustep for BLE strength/endurance and allowing weightshift onto prosthesis. During treatment wife stated that pt does nothing but sit in the recliner at home since pain has started. Pt/wife continue to go to [facility] for adjustments. Pt should benefit from PT sessions to progress towards goals.     Rehab Potential  Good    PT Frequency  2x / week    PT Duration  Other (comment)    PT Treatment/Interventions  ADLs/Self Care Home Management;Canalith Repostioning;DME Instruction;Gait training;Stair training;Functional mobility training;Therapeutic activities;Therapeutic exercise;Balance training;Neuromuscular re-education;Patient/family education;Prosthetic Training;Manual techniques;Vestibular    PT Next Visit Plan  Continue to work on proper donning/doffing, gait with RW & LBQC and high level balance on compliant surfaces.     Consulted and Agree with Plan of Care  Patient       Patient will benefit from skilled therapeutic intervention in order to improve the following deficits and impairments:  Abnormal gait, Decreased activity tolerance, Decreased balance, Decreased endurance, Decreased knowledge of use of DME, Decreased mobility, Impaired flexibility, Decreased range of motion, Decreased strength, Decreased skin integrity, Dizziness, Postural dysfunction, Prosthetic Dependency  Visit Diagnosis: Other abnormalities of gait and mobility  Muscle weakness (generalized)  Unsteadiness on feet     Problem List Patient Active Problem List   Diagnosis Date Noted  . Controlled type 2 diabetes mellitus without complication ([place]) 44/81/8563  . Bradycardia 09/01/2018  . Gangrene of left foot ([place]) 05/28/2018  . 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 Obstructive sleep apnea syndrome  05/04/2018  . Obstructive sleep apnea treated with continuous positive airway pressure (CPAP) 05/04/2018  . Type II diabetes mellitus, uncontrolled ([place]) 04/22/2018  . Atherosclerosis of native arteries of the extremities with gangrene ([place]) 04/09/2018  . Atherosclerosis of native artery of extremity ([place]) 04/09/2018  . Cellulitis in diabetic foot ([place]) 04/04/2018  . Paroxysmal atrial fibrillation ([place]) 12/05/2017  . Cardiac pacemaker in situ   . Symptomatic bradycardia   . Asystole ([place])   . 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 History of aortic valve replacement 12/02/2017  . Severe aortic stenosis   . Bilateral impacted cerumen 01/22/2016  . Essential hypertension 11/14/2015  . Peripheral vascular disease ([place]) 05/24/2014  . Chronic kidney disease 08/30/2013  . Hypothyroidism 08/30/2013  . 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 Diabetes mellitus type 2, uncomplicated ([place])   . Aortic valve stenosis 07/25/2011  . Hyperlipidemia 01/10/2011  . CAD (coronary artery disease) 01/10/2011  . Coronary arteriosclerosis 01/10/2011   Chassity Felts, PTA  Chassity A Felts 10/24/2018,  10:12 AM  [facility] [address] [place], [name], [address] Phone: [phone]   Fax:  [phone]  Name: Steven Maldonado MRN: [id number] Date of Birth: 02-05-1941

## 2018-10-26 ENCOUNTER — Other Ambulatory Visit: Payer: Self-pay | Admitting: *Deleted

## 2018-10-26 ENCOUNTER — Ambulatory Visit: Payer: Medicare Other | Admitting: Endocrinology

## 2018-10-26 MED ORDER — MIRABEGRON ER 25 MG PO TB24: 25.0000 mg | ORAL_TABLET | Freq: Every day | ORAL | 3 refills | Status: DC

## 2018-10-27 ENCOUNTER — Ambulatory Visit: Payer: Medicare Other

## 2018-10-28 ENCOUNTER — Encounter: Payer: Self-pay | Admitting: Physical Therapy

## 2018-10-28 ENCOUNTER — Ambulatory Visit: Payer: Medicare Other | Admitting: Physical Therapy

## 2018-10-28 ENCOUNTER — Other Ambulatory Visit: Payer: Self-pay

## 2018-10-28 DIAGNOSIS — R293 Abnormal posture: Secondary | ICD-10-CM | POA: Diagnosis not present

## 2018-10-28 DIAGNOSIS — R2689 Other abnormalities of gait and mobility: Secondary | ICD-10-CM

## 2018-10-28 DIAGNOSIS — M6281 Muscle weakness (generalized): Secondary | ICD-10-CM

## 2018-10-28 DIAGNOSIS — Z952 Presence of prosthetic heart valve: Secondary | ICD-10-CM

## 2018-10-28 DIAGNOSIS — R2681 Unsteadiness on feet: Secondary | ICD-10-CM | POA: Diagnosis not present

## 2018-10-28 DIAGNOSIS — I350 Nonrheumatic aortic (valve) stenosis: Secondary | ICD-10-CM

## 2018-10-29 NOTE — Therapy (Signed)
[facility] [address] [place], [name], [address] Phone: [phone]   Fax:  [phone]  Physical Therapy Treatment  Patient Details  Name: ISSAI WERLING MRN: [id number] Date of Birth: 10/01/1941 Referring Provider (PT): Mechele Claude, [place]   Encounter Date: 10/28/2018  PT End of Session - 10/28/18 2115    Visit Number  16    Number of Visits  26    Date for PT Re-Evaluation  12/02/17    Authorization Type  Medicare & Federal BCBS    Authorization Time Period  Medicare guidelines, Fed BCBS waves co-pay.  75 visit limit PT, OT & ST combined with 1 used.    Authorization - Visit Number  17    Authorization - Number of Visits  75    PT Start Time  8921    PT Stop Time  1615    PT Time Calculation (min)  42 min    Equipment Utilized During Treatment  Gait belt    Activity Tolerance  Patient tolerated treatment well;Patient limited by pain;No increased pain    Behavior During Therapy  WFL for tasks assessed/performed       Past Medical History:  Diagnosis Date  . Chronic diastolic CHF (congestive heart failure) ([place])   . CKD (chronic kidney disease), stage III ([place])   . Constipation   . Coronary artery disease    a. Cath February 2012 in [facility], occluded RCA with collaterals  . DM type 2 (diabetes mellitus, type 2) ([place])   . Essential hypertension   . Hyperlipidemia   . Neuropathy    feet  . Pacemaker    a. symptomatic brady after TAVR s/p MDT PPM by Dr. Curt Bears 12/04/17  . Persistent atrial fibrillation   . PONV (postoperative nausea and vomiting)    after valve surgery  . PVD (peripheral vascular disease) ([place])    a. s/p R popliteal artery stenosis tx with drug-coated balloon 05/2014, followed by Dr. Fletcher Anon.  . 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 Severe aortic stenosis    a.  12/02/17: s/p TAVR  . Skin cancer   . Sleep apnea with use of continuous positive airway pressure (CPAP)    04-11-11 AHI was 32.9 and titrated to 15 cm H20, DME is [facility]  . Subclinical hypothyroidism     Past Surgical History:  Procedure Laterality Date  . ABDOMINAL ANGIOGRAM N/A 06/08/2014   Procedure: ABDOMINAL ANGIOGRAM;  Surgeon: Wellington Hampshire, MD;  Location: [facility] CATH LAB;  Service: Cardiovascular;  Laterality: N/A;  . ABDOMINAL AORTOGRAM N/A 04/09/2018   Procedure: ABDOMINAL AORTOGRAM;  Surgeon: Conrad , MD;  Location: [place] CV LAB;  Service: Cardiovascular;  Laterality: N/A;  . AMPUTATION Left 04/17/2018   Procedure: LEFT FOOT 3RD RAY AMPUTATION;  Surgeon: Newt Minion, MD;  Location: [place];  Service: Orthopedics;  Laterality: Left;  . AMPUTATION Left 05/28/2018   Procedure: LEFT AMPUTATION BELOW KNEE;  Surgeon: Wylene Simmer, MD;  Location: [place];  Service: Orthopedics;  Laterality: Left;  . APPENDECTOMY  1965  . 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 CARDIAC CATHETERIZATION  12/2010  . CARDIOVERSION  07/2011  . CARDIOVERSION N/A 04/18/2014   Procedure: CARDIOVERSION;  Surgeon: Dorothy Spark, MD;  Location: [facility] ENDOSCOPY;  Service: Cardiovascular;  Laterality: N/A;  . CARDIOVERSION N/A 11/03/2015   Procedure: CARDIOVERSION;  Surgeon: Lelon Perla, MD;  Location: [facility] ENDOSCOPY;  Service: Cardiovascular;  Laterality: N/A;  . CARDIOVERSION N/A 05/08/2017   Procedure: CARDIOVERSION;  Surgeon: Dorothy Spark, MD;  Location: [facility] ENDOSCOPY;  Service: Cardiovascular;  Laterality: N/A;  . CARDIOVERSION N/A 07/28/2017   Procedure: CARDIOVERSION;  Surgeon: Dorothy Spark, MD;  Location: [place];  Service: Cardiovascular;  Laterality: N/A;  . LOWER EXTREMITY ANGIOGRAM N/A 06/08/2014   Procedure: LOWER EXTREMITY ANGIOGRAM;  Surgeon: Wellington Hampshire, MD;  Location: [facility] CATH LAB;  Service: Cardiovascular;  Laterality: N/A;  . LOWER EXTREMITY ANGIOGRAPHY Left 04/09/2018    Procedure: Lower Extremity Angiography;  Surgeon: Conrad [place], MD;  Location: [place] CV LAB;  Service: Cardiovascular;  Laterality: Left;  . PACEMAKER IMPLANT N/A 12/04/2017   Procedure: PACEMAKER IMPLANT;  Surgeon: Constance Haw, MD;  Location: [place] CV LAB;  Service: Cardiovascular;  Laterality: N/A;  . PERIPHERAL VASCULAR BALLOON ANGIOPLASTY Left 04/09/2018   Procedure: PERIPHERAL VASCULAR BALLOON ANGIOPLASTY;  Surgeon: Conrad [place], MD;  Location: [place] CV LAB;  Service: Cardiovascular;  Laterality: Left;  SFA  . POPLITEAL ARTERY ANGIOPLASTY Right 06/08/2014   Archie Endo 06/08/2014  . RIGHT/LEFT HEART CATH AND CORONARY ANGIOGRAPHY N/A 10/08/2017   Procedure: RIGHT/LEFT HEART CATH AND CORONARY ANGIOGRAPHY;  Surgeon: Burnell Blanks, MD;  Location: [place] CV LAB;  Service: Cardiovascular;  Laterality: N/A;  . SKIN CANCER EXCISION Bilateral    "have had them cut off back of neck X 2; off left upper arm; right wrist, near right shoulder blade" (06/08/2014)  . TEE WITHOUT CARDIOVERSION N/A 12/02/2017   Procedure: TRANSESOPHAGEAL ECHOCARDIOGRAM (TEE);  Surgeon: Burnell Blanks, MD;  Location: [place];  Service: Open Heart Surgery;  Laterality: N/A;  . TEMPORARY PACEMAKER N/A 12/04/2017   Procedure: TEMPORARY PACEMAKER;  Surgeon: Leonie Man, MD;  Location: [place] CV LAB;  Service: Cardiovascular;  Laterality: N/A;  . TRANSCATHETER AORTIC VALVE REPLACEMENT, TRANSFEMORAL N/A 12/02/2017   Procedure: TRANSCATHETER AORTIC VALVE REPLACEMENT, TRANSFEMORAL;  Surgeon: Burnell Blanks, MD;  Location: [place];  Service: Open Heart Surgery;  Laterality: N/A;  using Edwards Sapien 3 Transcatheter Heart Valve size 20mm    There were no vitals filed for this visit.  Subjective Assessment - 10/28/18 1536    Subjective  Pt and spouse both report that pain continues to be limiting him at home. Has not worn prosthesis/liner for last 3 days. Spouse also had a fall  herself which required a ED visit with possible concussion and has not been able to help him.     Patient is accompained by:  Family member    Pertinent History  L TTA, CAD, PAF,  pacemaker, DM2, neuropathy, CKD, HTN,     Limitations  Lifting;Standing;Walking;House hold activities    Patient Stated Goals  To use prosthesis in community, travel (uses [employer]ianne Lei), take of himself so wife does not have to do it. Housework.     Currently in Pain?  Yes    Pain Score  4     Pain Orientation  Left;Lateral;Anterior    Pain Descriptors / Indicators  Aching;Tender;Nagging    Pain Type  Acute pain    Pain Onset  1 to 4 weeks ago    Pain Frequency  Constant    Aggravating Factors   weight bearing with prosthesis on    Pain Relieving Factors  removing prosthesis          [facility] Adult PT Treatment/Exercise - 10/28/18 2120  Transfers   Transfers  Sit to Stand;Stand to Sit    Sit to Stand  5: Supervision;With upper extremity assist;From chair/3-in-1    Sit to Stand Details (indicate cue type and reason)  needs RW to stablize with standing    Stand to Sit  5: Supervision;With upper extremity assist;To chair/3-in-1    Stand to Sit Details  Needs RW to stabilize with sitting      Ambulation/Gait   Ambulation/Gait  Yes    Ambulation/Gait Assistance  5: Supervision;4: Min guard    Ambulation Distance (Feet)  100 Feet   x2, 120 x2   Assistive device  Rolling walker;Prosthesis    Gait Pattern  Step-through pattern;Decreased step length - right;Decreased stance time - left;Decreased stride length;Decreased hip/knee flexion - left;Decreased weight shift to left;Antalgic;Trunk flexed;Step-to pattern    Ambulation Surface  Level;Indoor      Prosthetics   Prosthetic Care Comments   adjusted pt's wear schedule to 4 hours on, 1 hour off rotation. pt is to perform ice massage during the off times (has not been doing this at home). also educated the pt/spouse on cut off sock for increased cushion on tibial crest  area so not to make too tight at knee area. The combination of both decreased pain in session today. Also re-educated the importance of consistency with wearing the prosthesis every day for the same amount of time. Re-inforced he does not have to be up on the prosthesis, just wearing it with gait only as needed for bathroom, going to another room for something or going to kitchen for food/drink. Pt and spouse verbalized understanding of all education provided today. Primary PT aware of plan and in agreement as well today.                                           Current prosthetic wear tolerance (days/week)   daily     Current prosthetic wear tolerance (#hours/day)   has not worn for the past 3 days due to pain    Residual limb condition   intact. noted small area/bump above incision line that could potentially be a neuroma (nerve bundle). will continue to monitor. mild redness noted on bony landmarks, no skin breakdown noted.     Education Provided  Residual limb care;Proper wear schedule/adjustment;Correct ply sock adjustment;Proper weight-bearing schedule/adjustment    Person(s) Educated  Patient;Spouse    Education Method  Explanation;Demonstration;Verbal cues    Education Method  Verbalized understanding;Returned demonstration;Verbal cues required;Needs further Photographer Prosthesis  Supervision           PT Short Term Goals - 10/13/18 1800      PT SHORT TERM GOAL #1   Title  Patient verbalizes adjusting ply socks with limb volume changes. (All Updated STGs Target Date: 10/30/2018)    Time  1    Period  Months    Status  On-going    Target Date  10/30/18      PT SHORT TERM GOAL #2   Title  Patient tolerates prosthesis wear >12hrs total /day without skin issues.     Time  1    Period  Months    Status  On-going    Target Date  10/30/18      PT SHORT TERM GOAL #3   Title  Patient reaches 5" &  to knee level without UE support with  supervision.     Time  1    Period  Months    Status  On-going    Target Date  10/30/18      PT SHORT TERM GOAL #4   Title  Patient ambulates 400' with RW & prosthesis with supervision.     Time  1    Period  Months    Status  On-going    Target Date  10/30/18      PT SHORT TERM GOAL #5   Title  Patient ambulates 100' with cane & prosthesis with minA.     Time  1    Period  Months    Status  On-going    Target Date  10/30/18        PT Long Term Goals - 09/02/18 1800      PT LONG TERM GOAL #1   Title  Patient verbalizes & demonstrates proper prosthetic care including donning / doffing independently to enable safe use of prosthesis (All LTGs Target Date: 11/27/2018)    Time  3    Period  Months    Status  New    Target Date  11/27/18      PT LONG TERM GOAL #2   Title  Patient tolerates prosthesis wear daily for >90% of awake hours without skin or limb pain issues to enable function throughout his day.     Time  3    Period  Months    Status  New    Target Date  11/27/18      PT LONG TERM GOAL #3   Title  Berg Balance >36/56 to indicate lower fall risk & less dependency in standing ADLs.     Time  3    Period  Months    Status  New    Target Date  11/27/18      PT LONG TERM GOAL #4   Title  Patient ambulates 500' with RW (or rollator) & prosthesis outdoors on paved or grass up to 50' surfaces modified independent for community mobility.     Time  3    Period  Months    Status  New    Target Date  11/27/18      PT LONG TERM GOAL #5   Title  Patient negotiates ramps, curbs with walker & stairs 1 rail /cane modified independent for community access.     Time  3    Period  Months    Status  New    Target Date  11/27/18            Plan - 10/28/18 2116    Clinical Impression Statement  Today's skilled session addressed prosthetic management with goal of pain reduction. After ice massage, introduction of a cut off sock pt reported decreased pain. Pt is to  alter his wear schedule and follow up with prosthetist again (needs to call) regarding any other changes needed to socket/vs new socket. Will continue with ice massage for swelling/pain mangement and use of RW with gait for now. Will gradually work on increased weight bearing on prosthesis with decreased UE support as able.     Rehab Potential  Good    PT Frequency  2x / week    PT Duration  Other (comment)    PT Treatment/Interventions  ADLs/Self Care Home Management;Canalith Repostioning;DME Instruction;Gait training;Stair training;Functional mobility training;Therapeutic activities;Therapeutic exercise;Balance training;Neuromuscular re-education;Patient/family education;Prosthetic Training;Manual techniques;Vestibular    PT Next  Visit Plan  ice massage at start of session, gait/barriers with RW, balance with bil UE support on compliant surfaces as pain allows. ensure sock ply is correct each visit as well.     Consulted and Agree with Plan of Care  Patient       Patient will benefit from skilled therapeutic intervention in order to improve the following deficits and impairments:  Abnormal gait, Decreased activity tolerance, Decreased balance, Decreased endurance, Decreased knowledge of use of DME, Decreased mobility, Impaired flexibility, Decreased range of motion, Decreased strength, Decreased skin integrity, Dizziness, Postural dysfunction, Prosthetic Dependency  Visit Diagnosis: Other abnormalities of gait and mobility  Muscle weakness (generalized)  Unsteadiness on feet  Abnormal posture     Problem List Patient Active Problem List   Diagnosis Date Noted  . Controlled type 2 diabetes mellitus without complication ([place]) 84/53/6468  . Bradycardia 09/01/2018  . Gangrene of left foot ([place]) 05/28/2018  . 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 Obstructive sleep apnea syndrome 05/04/2018  . Obstructive sleep apnea treated with continuous  positive airway pressure (CPAP) 05/04/2018  . Type II diabetes mellitus, uncontrolled ([place]) 04/22/2018  . Atherosclerosis of native arteries of the extremities with gangrene ([place]) 04/09/2018  . Atherosclerosis of native artery of extremity ([place]) 04/09/2018  . Cellulitis in diabetic foot ([place]) 04/04/2018  . Paroxysmal atrial fibrillation ([place]) 12/05/2017  . Cardiac pacemaker in situ   . Symptomatic bradycardia   . Asystole ([place])   . 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 History of aortic valve replacement 12/02/2017  . Severe aortic stenosis   . Bilateral impacted cerumen 01/22/2016  . Essential hypertension 11/14/2015  . Peripheral vascular disease ([place]) 05/24/2014  . Chronic kidney disease 08/30/2013  . Hypothyroidism 08/30/2013  . 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 Diabetes mellitus type 2, uncomplicated ([place])   . Aortic valve stenosis 07/25/2011  . Hyperlipidemia 01/10/2011  . CAD (coronary artery disease) 01/10/2011  . Coronary arteriosclerosis 01/10/2011    Willow Ora, PTA, [facility] [address] [place], [place] [address] [phone] 10/29/18, 9:51 PM   Name: FRANKLYN CAFARO MRN: [id number] Date of Birth: 1941-02-26

## 2018-10-30 ENCOUNTER — Ambulatory Visit: Payer: Medicare Other

## 2018-10-30 DIAGNOSIS — R2681 Unsteadiness on feet: Secondary | ICD-10-CM | POA: Diagnosis not present

## 2018-10-30 DIAGNOSIS — M6281 Muscle weakness (generalized): Secondary | ICD-10-CM | POA: Diagnosis not present

## 2018-10-30 DIAGNOSIS — R2689 Other abnormalities of gait and mobility: Secondary | ICD-10-CM

## 2018-10-30 DIAGNOSIS — R293 Abnormal posture: Secondary | ICD-10-CM | POA: Diagnosis not present

## 2018-10-31 NOTE — Therapy (Signed)
[facility] [address] [place], [name], [address] Phone: [phone]   Fax:  [phone]  Physical Therapy Treatment  Patient Details  Name: Steven Maldonado MRN: [id number] Date of Birth: July 01, 1941 Referring Provider (PT): Mechele Claude, [place]   Encounter Date: 10/30/2018  PT End of Session - 10/30/18 1329    Visit Number  17    Number of Visits  26    Date for PT Re-Evaluation  12/02/17    Authorization Type  Medicare & Federal BCBS    Authorization Time Period  Medicare guidelines, Fed BCBS waves co-pay.  75 visit limit PT, OT & ST combined with 1 used.    Authorization - Visit Number  17    Authorization - Number of Visits  75    PT Start Time  6144    PT Stop Time  1400    PT Time Calculation (min)  45 min    Equipment Utilized During Treatment  Gait belt    Activity Tolerance  Patient tolerated treatment well;Patient limited by pain;No increased pain    Behavior During Therapy  WFL for tasks assessed/performed       Past Medical History:  Diagnosis Date  . Chronic diastolic CHF (congestive heart failure) ([place])   . CKD (chronic kidney disease), stage III ([place])   . Constipation   . Coronary artery disease    a. Cath February 2012 in [facility], occluded RCA with collaterals  . DM type 2 (diabetes mellitus, type 2) ([place])   . Essential hypertension   . Hyperlipidemia   . Neuropathy    feet  . Pacemaker    a. symptomatic brady after TAVR s/p MDT PPM by Dr. Curt Bears 12/04/17  . Persistent atrial fibrillation   . PONV (postoperative nausea and vomiting)    after valve surgery  . PVD (peripheral vascular disease) ([place])    a. s/p R popliteal artery stenosis tx with drug-coated balloon 05/2014, followed by Dr. Fletcher Anon.  . 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 Severe aortic stenosis    a.  12/02/17: s/p TAVR  . Skin cancer   . Sleep apnea with use of continuous positive airway pressure (CPAP)    04-11-11 AHI was 32.9 and titrated to 15 cm H20, DME is [facility]  . Subclinical hypothyroidism     Past Surgical History:  Procedure Laterality Date  . ABDOMINAL ANGIOGRAM N/A 06/08/2014   Procedure: ABDOMINAL ANGIOGRAM;  Surgeon: Wellington Hampshire, MD;  Location: [facility] CATH LAB;  Service: Cardiovascular;  Laterality: N/A;  . ABDOMINAL AORTOGRAM N/A 04/09/2018   Procedure: ABDOMINAL AORTOGRAM;  Surgeon: Conrad [place], MD;  Location: [facility] CV LAB;  Service: Cardiovascular;  Laterality: N/A;  . AMPUTATION Left 04/17/2018   Procedure: LEFT FOOT 3RD RAY AMPUTATION;  Surgeon: Newt Minion, MD;  Location: [facility];  Service: Orthopedics;  Laterality: Left;  . AMPUTATION Left 05/28/2018   Procedure: LEFT AMPUTATION BELOW KNEE;  Surgeon: Wylene Simmer, MD;  Location: [facility];  Service: Orthopedics;  Laterality: Left;  . APPENDECTOMY  1965  . 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 CARDIAC CATHETERIZATION  12/2010  . CARDIOVERSION  07/2011  . CARDIOVERSION N/A 04/18/2014   Procedure: CARDIOVERSION;  Surgeon: Dorothy Spark, MD;  Location: [facility] ENDOSCOPY;  Service: Cardiovascular;  Laterality: N/A;  . CARDIOVERSION N/A 11/03/2015   Procedure: CARDIOVERSION;  Surgeon: Lelon Perla, MD;  Location: [facility] ENDOSCOPY;  Service: Cardiovascular;  Laterality: N/A;  . CARDIOVERSION N/A 05/08/2017   Procedure: CARDIOVERSION;  Surgeon: Dorothy Spark, MD;  Location: [facility] ENDOSCOPY;  Service: Cardiovascular;  Laterality: N/A;  . CARDIOVERSION N/A 07/28/2017   Procedure: CARDIOVERSION;  Surgeon: Dorothy Spark, MD;  Location: [facility];  Service: Cardiovascular;  Laterality: N/A;  . LOWER EXTREMITY ANGIOGRAM N/A 06/08/2014   Procedure: LOWER EXTREMITY ANGIOGRAM;  Surgeon: Wellington Hampshire, MD;  Location: [facility] CATH LAB;  Service: Cardiovascular;  Laterality: N/A;  . LOWER EXTREMITY ANGIOGRAPHY Left 04/09/2018    Procedure: Lower Extremity Angiography;  Surgeon: Conrad [place], MD;  Location: [facility] CV LAB;  Service: Cardiovascular;  Laterality: Left;  . PACEMAKER IMPLANT N/A 12/04/2017   Procedure: PACEMAKER IMPLANT;  Surgeon: Constance Haw, MD;  Location: [facility] CV LAB;  Service: Cardiovascular;  Laterality: N/A;  . PERIPHERAL VASCULAR BALLOON ANGIOPLASTY Left 04/09/2018   Procedure: PERIPHERAL VASCULAR BALLOON ANGIOPLASTY;  Surgeon: Conrad [place], MD;  Location: [facility] CV LAB;  Service: Cardiovascular;  Laterality: Left;  SFA  . POPLITEAL ARTERY ANGIOPLASTY Right 06/08/2014   Archie Endo 06/08/2014  . RIGHT/LEFT HEART CATH AND CORONARY ANGIOGRAPHY N/A 10/08/2017   Procedure: RIGHT/LEFT HEART CATH AND CORONARY ANGIOGRAPHY;  Surgeon: Burnell Blanks, MD;  Location: [facility] CV LAB;  Service: Cardiovascular;  Laterality: N/A;  . SKIN CANCER EXCISION Bilateral    "have had them cut off back of neck X 2; off left upper arm; right wrist, near right shoulder blade" (06/08/2014)  . TEE WITHOUT CARDIOVERSION N/A 12/02/2017   Procedure: TRANSESOPHAGEAL ECHOCARDIOGRAM (TEE);  Surgeon: Burnell Blanks, MD;  Location: [facility];  Service: Open Heart Surgery;  Laterality: N/A;  . TEMPORARY PACEMAKER N/A 12/04/2017   Procedure: TEMPORARY PACEMAKER;  Surgeon: Leonie Man, MD;  Location: [facility] CV LAB;  Service: Cardiovascular;  Laterality: N/A;  . TRANSCATHETER AORTIC VALVE REPLACEMENT, TRANSFEMORAL N/A 12/02/2017   Procedure: TRANSCATHETER AORTIC VALVE REPLACEMENT, TRANSFEMORAL;  Surgeon: Burnell Blanks, MD;  Location: [facility];  Service: Open Heart Surgery;  Laterality: N/A;  using Edwards Sapien 3 Transcatheter Heart Valve size 61m    There were no vitals filed for this visit.  Subjective Assessment - 10/30/18 1320    Subjective  Pt is still having issues with prosthesis, pt states that he was told it may be a nerve bundle, has a scan on monday to confirm. Spouse had a fall  monday, tripped on rug in bathroom and hit her head, went to hospital with normal results.     Patient is accompained by:  Family member    Pertinent History  L TTA, CAD, PAF,  pacemaker, DM2, neuropathy, CKD, HTN,     Limitations  Lifting;Standing;Walking;House hold activities    Patient Stated Goals  To use prosthesis in community, travel (uses vLucianne Lei, take of himself so wife does not have to do it. Housework.     Currently in Pain?  Yes    Pain Score  5     Pain Location  Leg    Pain Orientation  Left;Distal;Lateral;Anterior    Pain Descriptors / Indicators  Aching;Sore    Pain Type  Acute pain    Pain Onset  More than a month ago    Pain Frequency  Constant    Aggravating Factors   weightshift onto prosthesis    Pain Relieving Factors  removing prosthesis        [facility] Adult  PT Treatment/Exercise - 10/31/18 0001      Ambulation/Gait   Ambulation/Gait  Yes    Ambulation/Gait Assistance  5: Supervision    Ambulation/Gait Assistance Details  Pt presented with pain in residual limb and required seated rest break prior to achieveing STG.     Ambulation Distance (Feet)  130 Feet    Assistive device  Rolling walker;Prosthesis    Gait Pattern  Step-through pattern;Decreased step length - right;Decreased stance time - left;Decreased stride length;Decreased hip/knee flexion - left;Decreased weight shift to left;Antalgic;Trunk flexed;Step-to pattern    Ambulation Surface  Level;Indoor      Neuro Re-ed    Neuro Re-ed Details   Pt able to reach >5'/below knee level with no UE support, pt required increased time and min guard for safety.       Prosthetics   Prosthetic Care Comments   Pt continues to wear prosthesis 4hrs/2xday while performing ice massage when off. Pt continues to C/O pain after weightbearing on limb, red areas on ant/L Lateral lower residial limb. Pt continues to practice donning prosthesis with improvment in proper positioing.     Current prosthetic wear tolerance (days/week)    daily     Education Provided  Skin check;Residual limb care;Proper Donning;Proper wear schedule/adjustment    Person(s) Educated  Patient    Education Method  Explanation;Demonstration;Tactile cues;Verbal cues    Education Method  Verbalized understanding;Returned demonstration;Tactile cues required;Verbal cues required;Needs further instruction    Donning Prosthesis  Supervision;Minimal assist    Doffing Prosthesis  Modified independent (device/increased time)               PT Short Term Goals - 10/30/18 1330      PT SHORT TERM GOAL #1   Title  Patient verbalizes adjusting ply socks with limb volume changes. (All Updated STGs Target Date: 10/30/2018)    Baseline  12/20: Pt able to adjust ply sock with limb volume change.     Time  1    Period  Months    Status  Achieved      PT SHORT TERM GOAL #2   Title  Patient tolerates prosthesis wear >12hrs total /day without skin issues.     Time  1    Period  Months    Status  On-going      PT SHORT TERM GOAL #3   Title  Patient reaches 5" & to knee level without UE support with supervision.     Baseline  12/20:  pt able to reach 5" & to knee level with no UE support, min guard.     Time  1    Period  Months    Status  Partially Met      PT SHORT TERM GOAL #4   Title  Patient ambulates 400' with RW & prosthesis with supervision.     Baseline  12/20: Pt ambulated 130' with RW prior to seated rest break, supervision for safety.     Time  1    Period  Months    Status  Not Met      PT SHORT TERM GOAL #5   Title  Patient ambulates 100' with cane & prosthesis with minA.     Time  1    Period  Months    Status  On-going        PT Long Term Goals - 09/02/18 1800      PT LONG TERM GOAL #1   Title  Patient verbalizes & demonstrates proper  prosthetic care including donning / doffing independently to enable safe use of prosthesis (All LTGs Target Date: 11/27/2018)    Time  3    Period  Months    Status  New    Target Date   11/27/18      PT LONG TERM GOAL #2   Title  Patient tolerates prosthesis wear daily for >90% of awake hours without skin or limb pain issues to enable function throughout his day.     Time  3    Period  Months    Status  New    Target Date  11/27/18      PT LONG TERM GOAL #3   Title  Berg Balance >36/56 to indicate lower fall risk & less dependency in standing ADLs.     Time  3    Period  Months    Status  New    Target Date  11/27/18      PT LONG TERM GOAL #4   Title  Patient ambulates 500' with RW (or rollator) & prosthesis outdoors on paved or grass up to 50' surfaces modified independent for community mobility.     Time  3    Period  Months    Status  New    Target Date  11/27/18      PT LONG TERM GOAL #5   Title  Patient negotiates ramps, curbs with walker & stairs 1 rail /cane modified independent for community access.     Time  3    Period  Months    Status  New    Target Date  11/27/18            Plan - 10/31/18 1017    Clinical Impression Statement  Todays skilled session focused on checking STG's with 1/5 met, 1/5 partially met predominantly due to pain in residual limb while weight bearing, and prosthetic management. Pt continues to present with increased pain while maintaining weight on residual limb during all activity. Pt is scheduled for further testing on residual limb next week.    Rehab Potential  Good    PT Frequency  2x / week    PT Duration  Other (comment)    PT Treatment/Interventions  ADLs/Self Care Home Management;Canalith Repostioning;DME Instruction;Gait training;Stair training;Functional mobility training;Therapeutic activities;Therapeutic exercise;Balance training;Neuromuscular re-education;Patient/family education;Prosthetic Training;Manual techniques;Vestibular    PT Next Visit Plan  ice massage at start of session, gait/barriers with RW, balance with bil UE support on compliant surfaces as pain allows. ensure sock ply is correct each visit as  well.     Consulted and Agree with Plan of Care  Patient       Patient will benefit from skilled therapeutic intervention in order to improve the following deficits and impairments:  Abnormal gait, Decreased activity tolerance, Decreased balance, Decreased endurance, Decreased knowledge of use of DME, Decreased mobility, Impaired flexibility, Decreased range of motion, Decreased strength, Decreased skin integrity, Dizziness, Postural dysfunction, Prosthetic Dependency  Visit Diagnosis: Other abnormalities of gait and mobility  Muscle weakness (generalized)  Unsteadiness on feet     Problem List Patient Active Problem List   Diagnosis Date Noted  . Controlled type 2 diabetes mellitus without complication ([place]) 33/29/5188  . Bradycardia 09/01/2018  . Gangrene of left foot ([place]) 05/28/2018  . 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 Obstructive sleep apnea syndrome 05/04/2018  . Obstructive sleep apnea treated with continuous positive airway pressure (CPAP) 05/04/2018  . Type  II diabetes mellitus, uncontrolled ([place]) 04/22/2018  . Atherosclerosis of native arteries of the extremities with gangrene ([place]) 04/09/2018  . Atherosclerosis of native artery of extremity ([place]) 04/09/2018  . Cellulitis in diabetic foot ([place]) 04/04/2018  . Paroxysmal atrial fibrillation ([place]) 12/05/2017  . Cardiac pacemaker in situ   . Symptomatic bradycardia   . Asystole ([place])   . 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 History of aortic valve replacement 12/02/2017  . Severe aortic stenosis   . Bilateral impacted cerumen 01/22/2016  . Essential hypertension 11/14/2015  . Peripheral vascular disease ([place]) 05/24/2014  . Chronic kidney disease 08/30/2013  . Hypothyroidism 08/30/2013  . 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 Diabetes mellitus type 2, uncomplicated ([place])   . Aortic valve stenosis 07/25/2011  . Hyperlipidemia  01/10/2011  . CAD (coronary artery disease) 01/10/2011  . Coronary arteriosclerosis 01/10/2011   Mikailah Morel, PTA  Ieesha Abbasi A Annalis Kaczmarczyk 10/31/2018, 10:28 AM  [facility] [address] [place], [name], [address] Phone: [phone]   Fax:  [phone]  Name: Steven Maldonado MRN: [id number] Date of Birth: 1940/12/05

## 2018-11-03 ENCOUNTER — Encounter: Payer: Self-pay | Admitting: Physical Therapy

## 2018-11-03 ENCOUNTER — Ambulatory Visit: Payer: Medicare Other | Admitting: Physical Therapy

## 2018-11-03 DIAGNOSIS — R2689 Other abnormalities of gait and mobility: Secondary | ICD-10-CM | POA: Diagnosis not present

## 2018-11-03 DIAGNOSIS — R293 Abnormal posture: Secondary | ICD-10-CM

## 2018-11-03 DIAGNOSIS — M6281 Muscle weakness (generalized): Secondary | ICD-10-CM

## 2018-11-03 DIAGNOSIS — R2681 Unsteadiness on feet: Secondary | ICD-10-CM

## 2018-11-04 NOTE — Therapy (Signed)
[facility] [address] [place], [name], [address] Phone: [phone]   Fax:  [phone]  Physical Therapy Treatment  Patient Details  Name: Steven Maldonado MRN: [id number] Date of Birth: [birth year] Referring Provider (PT): Mechele Claude, [place]   Encounter Date: 11/03/2018  PT End of Session - 11/03/18 2208    Visit Number  18    Number of Visits  26    Date for PT Re-Evaluation  12/02/17    Authorization Type  Medicare & Federal BCBS    Authorization Time Period  Medicare guidelines, Fed BCBS waves co-pay.  75 visit limit PT, OT & ST combined with 1 used.    Authorization - Visit Number  18    Authorization - Number of Visits  75    Equipment Utilized During Treatment  Gait belt    Activity Tolerance  Patient tolerated treatment well;No increased pain    Behavior During Therapy  [facility] for tasks assessed/performed        11/03/18 2208  PT Visits / Re-Eval  Visit Number 18  Number of Visits 26  Date for PT Re-Evaluation 12/02/17  Authorization  Authorization Type Medicare & Federal BCBS  Authorization Time Period Medicare guidelines, Fed BCBS waves co-pay.  75 visit limit PT, OT & ST combined with 1 used.  Authorization - Visit Number 18  Authorization - Number of Visits 75  PT - End of Session  Equipment Utilized During Treatment Gait belt  Activity Tolerance Patient tolerated treatment well;No increased pain  Behavior During Therapy WFL for tasks assessed/performed    Past Medical History:  Diagnosis Date  . Chronic diastolic CHF (congestive heart failure) ([place])   . CKD (chronic kidney disease), stage III ([place])   . Constipation   . Coronary artery disease    a. Cath February 2012 in [facility], occluded RCA with collaterals  . DM type 2 (diabetes mellitus, type 2) ([place])   . Essential hypertension   . Hyperlipidemia   . Neuropathy    feet  . Pacemaker    a. symptomatic brady after TAVR s/p MDT PPM by Dr.  Curt Bears 12/04/17  . Persistent atrial fibrillation   . PONV (postoperative nausea and vomiting)    after valve surgery  . PVD (peripheral vascular disease) ([place])    a. s/p R popliteal artery stenosis tx with drug-coated balloon 05/2014, followed by Dr. Fletcher Anon.  . 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 Severe aortic stenosis    a. 12/02/17: s/p TAVR  . Skin cancer   . Sleep apnea with use of continuous positive airway pressure (CPAP)    04-11-11 AHI was 32.9 and titrated to 15 cm H20, DME is [facility]  . Subclinical hypothyroidism     Past Surgical History:  Procedure Laterality Date  . ABDOMINAL ANGIOGRAM N/A 06/08/2014   Procedure: ABDOMINAL ANGIOGRAM;  Surgeon: Wellington Hampshire, MD;  Location: [facility] CATH LAB;  Service: Cardiovascular;  Laterality: N/A;  . ABDOMINAL AORTOGRAM N/A 04/09/2018   Procedure: ABDOMINAL AORTOGRAM;  Surgeon: Conrad Coquille, MD;  Location: [facility] CV LAB;  Service: Cardiovascular;  Laterality: N/A;  . AMPUTATION Left 04/17/2018   Procedure: LEFT FOOT 3RD RAY AMPUTATION;  Surgeon: Newt Minion, MD;  Location: [facility];  Service: Orthopedics;  Laterality: Left;  . AMPUTATION Left 05/28/2018   Procedure: LEFT AMPUTATION BELOW KNEE;  Surgeon:  Wylene Simmer, MD;  Location: [facility];  Service: Orthopedics;  Laterality: Left;  . APPENDECTOMY  1965  . 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 CARDIAC CATHETERIZATION  12/2010  . CARDIOVERSION  07/2011  . CARDIOVERSION N/A 04/18/2014   Procedure: CARDIOVERSION;  Surgeon: Dorothy Spark, MD;  Location: [facility];  Service: Cardiovascular;  Laterality: N/A;  . CARDIOVERSION N/A 11/03/2015   Procedure: CARDIOVERSION;  Surgeon: Lelon Perla, MD;  Location: [facility] ENDOSCOPY;  Service: Cardiovascular;  Laterality: N/A;  . CARDIOVERSION N/A 05/08/2017   Procedure: CARDIOVERSION;  Surgeon: Dorothy Spark, MD;   Location: [facility] ENDOSCOPY;  Service: Cardiovascular;  Laterality: N/A;  . CARDIOVERSION N/A 07/28/2017   Procedure: CARDIOVERSION;  Surgeon: Dorothy Spark, MD;  Location: [facility];  Service: Cardiovascular;  Laterality: N/A;  . LOWER EXTREMITY ANGIOGRAM N/A 06/08/2014   Procedure: LOWER EXTREMITY ANGIOGRAM;  Surgeon: Wellington Hampshire, MD;  Location: [facility] CATH LAB;  Service: Cardiovascular;  Laterality: N/A;  . LOWER EXTREMITY ANGIOGRAPHY Left 04/09/2018   Procedure: Lower Extremity Angiography;  Surgeon: Conrad Murray, MD;  Location: [facility] CV LAB;  Service: Cardiovascular;  Laterality: Left;  . PACEMAKER IMPLANT N/A 12/04/2017   Procedure: PACEMAKER IMPLANT;  Surgeon: Constance Haw, MD;  Location: [place] [facility] CV LAB;  Service: Cardiovascular;  Laterality: N/A;  . PERIPHERAL VASCULAR BALLOON ANGIOPLASTY Left 04/09/2018   Procedure: PERIPHERAL VASCULAR BALLOON ANGIOPLASTY;  Surgeon: Conrad , MD;  Location: [facility] CV LAB;  Service: Cardiovascular;  Laterality: Left;  SFA  . POPLITEAL ARTERY ANGIOPLASTY Right 06/08/2014   Archie Endo 06/08/2014  . RIGHT/LEFT HEART CATH AND CORONARY ANGIOGRAPHY N/A 10/08/2017   Procedure: RIGHT/LEFT HEART CATH AND CORONARY ANGIOGRAPHY;  Surgeon: Burnell Blanks, MD;  Location: [facility] CV LAB;  Service: Cardiovascular;  Laterality: N/A;  . SKIN CANCER EXCISION Bilateral    "have had them cut off back of neck X 2; off left upper arm; right wrist, near right shoulder blade" (06/08/2014)  . TEE WITHOUT CARDIOVERSION N/A 12/02/2017   Procedure: TRANSESOPHAGEAL ECHOCARDIOGRAM (TEE);  Surgeon: Burnell Blanks, MD;  Location: [facility];  Service: Open Heart Surgery;  Laterality: N/A;  . TEMPORARY PACEMAKER N/A 12/04/2017   Procedure: TEMPORARY PACEMAKER;  Surgeon: Leonie Man, MD;  Location: [facility] CV LAB;  Service: Cardiovascular;  Laterality: N/A;  . TRANSCATHETER AORTIC VALVE REPLACEMENT, TRANSFEMORAL N/A 12/02/2017   Procedure:  TRANSCATHETER AORTIC VALVE REPLACEMENT, TRANSFEMORAL;  Surgeon: Burnell Blanks, MD;  Location: [facility];  Service: Open Heart Surgery;  Laterality: N/A;  using Edwards Sapien 3 Transcatheter Heart Valve size 93m    There were no vitals filed for this visit.     11/03/18 1017  Symptoms/Limitations  Subjective Prosthetist is working on modifying socket. It felt better with new socket.   Patient is accompained by: Family member  Pertinent History L TTA, CAD, PAF,  pacemaker, DM2, neuropathy, CKD, HTN,   Limitations Lifting;Standing;Walking;House hold activities  Patient Stated Goals To use prosthesis in community, travel (uses vLucianne Lei, take of himself so wife does not have to do it. Housework.   Pain Assessment  Currently in Pain? Yes  Pain Score 1  Pain Location Leg  Pain Orientation Left;Distal;Lateral;Anterior  Pain Descriptors / Indicators Aching;Sore  Pain Type Acute pain  Pain Onset More than a month ago  Pain Frequency Constant  Aggravating Factors  weightshifting onto prosthesis   Pain Relieving Factors removing prosthesis    Prosthetic Training with Transtibial Amputation Patient arrived ambulating  with RW with new temporary socket on prosthesis. He reports less limb pain. Patient's residual limb with no wounds, redness in pressure tolerant areas, normal temperature & moisture. PT instructed in use of cutoff sock under proximal liner, cutoff distal limb to improve fit, rubbing limb with doffing to limit scratching, using umbrella circle contact with distal limb to help align pin and checking for no wrinkles in liner. Pt ambulated 400' with RW & prosthesis with supervision.                            PT Short Term Goals - 11/03/18 2209      PT SHORT TERM GOAL #1   Title  Patient verbalizes adjusting ply socks with limb volume changes. (All Updated STGs Target Date: 10/30/2018)    Baseline  12/20: Pt able to adjust ply sock with limb volume change.      Time  1    Period  Months    Status  Achieved      PT SHORT TERM GOAL #2   Title  Patient tolerates prosthesis wear >12hrs total /day without skin issues.     Baseline  MET 11/02/2018    Time  1    Period  Months    Status  Achieved      PT SHORT TERM GOAL #3   Title  Patient reaches 5" & to knee level without UE support with supervision.     Baseline  12/20:  pt able to reach 5" & to knee level with no UE support, min guard.     Time  1    Period  Months    Status  Partially Met      PT SHORT TERM GOAL #4   Title  Patient ambulates 400' with RW & prosthesis with supervision.     Baseline  MET 11/02/2018    Time  1    Period  Months    Status  Achieved      PT SHORT TERM GOAL #5   Title  Patient ambulates 100' with cane & prosthesis with minA.     Baseline  NOT MET 11/02/2018  Patient has residual limb pain with increased weight bearing with cane.     Time  1    Period  Months    Status  Not Met        PT Long Term Goals - 09/02/18 1800      PT LONG TERM GOAL #1   Title  Patient verbalizes & demonstrates proper prosthetic care including donning / doffing independently to enable safe use of prosthesis (All LTGs Target Date: 11/27/2018)    Time  3    Period  Months    Status  New    Target Date  11/27/18      PT LONG TERM GOAL #2   Title  Patient tolerates prosthesis wear daily for >90% of awake hours without skin or limb pain issues to enable function throughout his day.     Time  3    Period  Months    Status  New    Target Date  11/27/18      PT LONG TERM GOAL #3   Title  Berg Balance >36/56 to indicate lower fall risk & less dependency in standing ADLs.     Time  3    Period  Months    Status  New    Target Date  11/27/18      PT LONG TERM GOAL #4   Title  Patient ambulates 500' with RW (or rollator) & prosthesis outdoors on paved or grass up to 50' surfaces modified independent for community mobility.     Time  3    Period  Months    Status  New     Target Date  11/27/18      PT LONG TERM GOAL #5   Title  Patient negotiates ramps, curbs with walker & stairs 1 rail /cane modified independent for community access.     Time  3    Period  Months    Status  New    Target Date  11/27/18            Plan - 11/03/18 2211    Clinical Impression Statement  Patient reported less residual limb pain with socket revision and skilled instruction in donning & adjusting ply socks.  Patient met or partially met 4 of 5 STGs.     Rehab Potential  Good    PT Frequency  2x / week    PT Duration  Other (comment)    PT Treatment/Interventions  ADLs/Self Care Home Management;Canalith Repostioning;DME Instruction;Gait training;Stair training;Functional mobility training;Therapeutic activities;Therapeutic exercise;Balance training;Neuromuscular re-education;Patient/family education;Prosthetic Training;Manual techniques;Vestibular    PT Next Visit Plan  work towards [place], check residual limb pain    Consulted and Agree with Plan of Care  Patient       Patient will benefit from skilled therapeutic intervention in order to improve the following deficits and impairments:  Abnormal gait, Decreased activity tolerance, Decreased balance, Decreased endurance, Decreased knowledge of use of DME, Decreased mobility, Impaired flexibility, Decreased range of motion, Decreased strength, Decreased skin integrity, Dizziness, Postural dysfunction, Prosthetic Dependency  Visit Diagnosis: Other abnormalities of gait and mobility  Muscle weakness (generalized)  Unsteadiness on feet  Abnormal posture     Problem List Patient Active Problem List   Diagnosis Date Noted  . Controlled type 2 diabetes mellitus without complication ([place]) 17/00/1749  . Bradycardia 09/01/2018  . Gangrene of left foot ([place]) 05/28/2018  . 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 Obstructive sleep apnea syndrome 05/04/2018  . Obstructive sleep  apnea treated with continuous positive airway pressure (CPAP) 05/04/2018  . Type II diabetes mellitus, uncontrolled ([place]) 04/22/2018  . Atherosclerosis of native arteries of the extremities with gangrene ([place]) 04/09/2018  . Atherosclerosis of native artery of extremity ([employer] [place]) 04/09/2018  . Cellulitis in diabetic foot ([place]) 04/04/2018  . Paroxysmal atrial fibrillation ([place]) 12/05/2017  . Cardiac pacemaker in situ   . Symptomatic bradycardia   . Asystole ([place])   . 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 History of aortic valve replacement 12/02/2017  . Severe aortic stenosis   . Bilateral impacted cerumen 01/22/2016  . Essential hypertension 11/14/2015  . Peripheral vascular disease ([place]) 05/24/2014  . Chronic kidney disease 08/30/2013  . Hypothyroidism 08/30/2013  . 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 Diabetes mellitus type 2, uncomplicated ([place])   . Aortic valve stenosis 07/25/2011  . Hyperlipidemia 01/10/2011  . CAD (coronary artery disease) 01/10/2011  . Coronary arteriosclerosis 01/10/2011    Jamey Reas PT, DPT 11/04/2018, 10:14 PM  [facility] [address] [place], [name], [address] Phone: [phone]   Fax:  [phone]  Name: Steven Maldonado MRN: [id number] Date of Birth: November 23, 1940

## 2018-11-06 ENCOUNTER — Ambulatory Visit: Payer: Medicare Other

## 2018-11-06 DIAGNOSIS — R2681 Unsteadiness on feet: Secondary | ICD-10-CM

## 2018-11-06 DIAGNOSIS — M6281 Muscle weakness (generalized): Secondary | ICD-10-CM

## 2018-11-06 DIAGNOSIS — R293 Abnormal posture: Secondary | ICD-10-CM | POA: Diagnosis not present

## 2018-11-06 DIAGNOSIS — R2689 Other abnormalities of gait and mobility: Secondary | ICD-10-CM | POA: Diagnosis not present

## 2018-11-06 NOTE — Therapy (Signed)
[facility] [address] [place], [name], [address] Phone: [phone]   Fax:  [phone]  Physical Therapy Treatment  Patient Details  Name: Steven Maldonado MRN: [id number] Date of Birth: 10-Feb-1941 Referring Provider (PT): Mechele Claude, [place]   Encounter Date: 11/06/2018  PT End of Session - 11/06/18 1330    Visit Number  19    Number of Visits  26    Date for PT Re-Evaluation  12/02/17    Authorization Type  Medicare & Federal BCBS    Authorization Time Period  Medicare guidelines, Fed BCBS waves co-pay.  75 visit limit PT, OT & ST combined with 1 used.    Authorization - Visit Number  18    Authorization - Number of Visits  75    PT Start Time  6808    PT Stop Time  1403    PT Time Calculation (min)  48 min    Equipment Utilized During Treatment  Gait belt    Activity Tolerance  Patient tolerated treatment well;No increased pain    Behavior During Therapy  WFL for tasks assessed/performed       Past Medical History:  Diagnosis Date  . Chronic diastolic CHF (congestive heart failure) ([place])   . CKD (chronic kidney disease), stage III ([place])   . Constipation   . Coronary artery disease    a. Cath February 2012 in [facility], occluded RCA with collaterals  . DM type 2 (diabetes mellitus, type 2) ([place])   . Essential hypertension   . Hyperlipidemia   . Neuropathy    feet  . Pacemaker    a. symptomatic brady after TAVR s/p MDT PPM by Dr. Curt Bears 12/04/17  . Persistent atrial fibrillation   . PONV (postoperative nausea and vomiting)    after valve surgery  . PVD (peripheral vascular disease) ([place])    a. s/p R popliteal artery stenosis tx with drug-coated balloon 05/2014, followed by Dr. Fletcher Anon.  . 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 Severe aortic stenosis    a. 12/02/17: s/p TAVR  .  Skin cancer   . Sleep apnea with use of continuous positive airway pressure (CPAP)    04-11-11 AHI was 32.9 and titrated to 15 cm H20, DME is [facility]  . Subclinical hypothyroidism     Past Surgical History:  Procedure Laterality Date  . ABDOMINAL ANGIOGRAM N/A 06/08/2014   Procedure: ABDOMINAL ANGIOGRAM;  Surgeon: Wellington Hampshire, MD;  Location: [facility] CATH LAB;  Service: Cardiovascular;  Laterality: N/A;  . ABDOMINAL AORTOGRAM N/A 04/09/2018   Procedure: ABDOMINAL AORTOGRAM;  Surgeon: Conrad Homestown, MD;  Location: [facility] CV LAB;  Service: Cardiovascular;  Laterality: N/A;  . AMPUTATION Left 04/17/2018   Procedure: LEFT FOOT 3RD RAY AMPUTATION;  Surgeon: Newt Minion, MD;  Location: [facility];  Service: Orthopedics;  Laterality: Left;  . AMPUTATION Left 05/28/2018   Procedure: LEFT AMPUTATION BELOW KNEE;  Surgeon: Wylene Simmer, MD;  Location: [facility];  Service: Orthopedics;  Laterality: Left;  . APPENDECTOMY  1965  . 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 CARDIAC CATHETERIZATION  12/2010  . CARDIOVERSION  07/2011  . CARDIOVERSION N/A 04/18/2014   Procedure: CARDIOVERSION;  Surgeon: Dorothy Spark, MD;  Location: [facility] ENDOSCOPY;  Service: Cardiovascular;  Laterality: N/A;  . CARDIOVERSION N/A 11/03/2015   Procedure: CARDIOVERSION;  Surgeon: Aaron Edelman  Jacalyn Lefevre, MD;  Location: [facility] ENDOSCOPY;  Service: Cardiovascular;  Laterality: N/A;  . CARDIOVERSION N/A 05/08/2017   Procedure: CARDIOVERSION;  Surgeon: Dorothy Spark, MD;  Location: [facility] ENDOSCOPY;  Service: Cardiovascular;  Laterality: N/A;  . CARDIOVERSION N/A 07/28/2017   Procedure: CARDIOVERSION;  Surgeon: Dorothy Spark, MD;  Location: [facility];  Service: Cardiovascular;  Laterality: N/A;  . LOWER EXTREMITY ANGIOGRAM N/A 06/08/2014   Procedure: LOWER EXTREMITY ANGIOGRAM;  Surgeon: Wellington Hampshire, MD;  Location: [facility] CATH LAB;  Service: Cardiovascular;  Laterality: N/A;  . LOWER EXTREMITY ANGIOGRAPHY Left 04/09/2018   Procedure: Lower  Extremity Angiography;  Surgeon: Conrad Honcut, MD;  Location: [facility] CV LAB;  Service: Cardiovascular;  Laterality: Left;  . PACEMAKER IMPLANT N/A 12/04/2017   Procedure: PACEMAKER IMPLANT;  Surgeon: Constance Haw, MD;  Location: [facility] CV LAB;  Service: Cardiovascular;  Laterality: N/A;  . PERIPHERAL VASCULAR BALLOON ANGIOPLASTY Left 04/09/2018   Procedure: PERIPHERAL VASCULAR BALLOON ANGIOPLASTY;  Surgeon: Conrad Hatteras, MD;  Location: [facility] CV LAB;  Service: Cardiovascular;  Laterality: Left;  SFA  . POPLITEAL ARTERY ANGIOPLASTY Right 06/08/2014   Archie Endo 06/08/2014  . RIGHT/LEFT HEART CATH AND CORONARY ANGIOGRAPHY N/A 10/08/2017   Procedure: RIGHT/LEFT HEART CATH AND CORONARY ANGIOGRAPHY;  Surgeon: Burnell Blanks, MD;  Location: [facility] CV LAB;  Service: Cardiovascular;  Laterality: N/A;  . SKIN CANCER EXCISION Bilateral    "have had them cut off back of neck X 2; off left upper arm; right wrist, near right shoulder blade" (06/08/2014)  . TEE WITHOUT CARDIOVERSION N/A 12/02/2017   Procedure: TRANSESOPHAGEAL ECHOCARDIOGRAM (TEE);  Surgeon: Burnell Blanks, MD;  Location: [facility];  Service: Open Heart Surgery;  Laterality: N/A;  . TEMPORARY PACEMAKER N/A 12/04/2017   Procedure: TEMPORARY PACEMAKER;  Surgeon: Leonie Man, MD;  Location: [facility] CV LAB;  Service: Cardiovascular;  Laterality: N/A;  . TRANSCATHETER AORTIC VALVE REPLACEMENT, TRANSFEMORAL N/A 12/02/2017   Procedure: TRANSCATHETER AORTIC VALVE REPLACEMENT, TRANSFEMORAL;  Surgeon: Burnell Blanks, MD;  Location: [facility];  Service: Open Heart Surgery;  Laterality: N/A;  using Edwards Sapien 3 Transcatheter Heart Valve size 90m    There were no vitals filed for this visit.  Subjective Assessment - 11/06/18 1325    Subjective  Pt went to [facility] today and had a fitting for another prosthesis with better results when ambulating on it, new prosthesis should be available on Friday.       Patient is accompained by:  Family member    Pertinent History  L TTA, CAD, PAF,  pacemaker, DM2, neuropathy, CKD, HTN,     Limitations  Lifting;Standing;Walking;House hold activities    Patient Stated Goals  To use prosthesis in community, travel (uses vLucianne Lei, take of himself so wife does not have to do it. Housework.     Currently in Pain?  Yes    Pain Score  4     Pain Location  Leg   residual limb   Pain Orientation  Left;Lateral;Anterior    Pain Descriptors / Indicators  Aching;Sore    Pain Type  Acute pain    Pain Onset  More than a month ago    Pain Frequency  Constant    Aggravating Factors   weightshift onto prosthesis    Pain Relieving Factors  Removing prosthesis         [facility] Adult PT Treatment/Exercise - 11/06/18 1331      Ambulation/Gait   Ambulation/Gait  Yes  Ambulation/Gait Assistance  5: Supervision    Ambulation/Gait Assistance Details  VC's for heel strike/toe off.  Pt presented with excessive UE weightbearing due to pain in residual limb.     Ambulation Distance (Feet)  115 Feet    Assistive device  Rolling walker;Prosthesis    Gait Pattern  Step-through pattern;Decreased step length - right;Decreased stance time - left;Decreased stride length;Decreased hip/knee flexion - left;Decreased weight shift to left;Antalgic;Trunk flexed;Step-to pattern    Ambulation Surface  Level;Indoor      Cryotherapy   Number Minutes Cryotherapy  5 Minutes    Cryotherapy Location  Other (comment)   residual limb   Type of Cryotherapy  Ice massage      Prosthetics   Current prosthetic wear tolerance (days/week)   daily     Current prosthetic wear tolerance (#hours/day)   4hrs on 2hrs off with ice massage, 4hrs on.     Education Provided  Skin check;Proper Psychologist, occupational) Educated  Patient    Education Method  Explanation;Demonstration;Verbal cues;Tactile cues    Education Method  Verbalized understanding;Returned demonstration;Tactile cues required;Verbal cues required     Donning Prosthesis  Minimal assist;Supervision    Doffing Prosthesis  Independent               PT Short Term Goals - 11/03/18 2209      PT SHORT TERM GOAL #1   Title  Patient verbalizes adjusting ply socks with limb volume changes. (All Updated STGs Target Date: 10/30/2018)    Baseline  12/20: Pt able to adjust ply sock with limb volume change.     Time  1    Period  Months    Status  Achieved      PT SHORT TERM GOAL #2   Title  Patient tolerates prosthesis wear >12hrs total /day without skin issues.     Baseline  MET 11/02/2018    Time  1    Period  Months    Status  Achieved      PT SHORT TERM GOAL #3   Title  Patient reaches 5" & to knee level without UE support with supervision.     Baseline  12/20:  pt able to reach 5" & to knee level with no UE support, min guard.     Time  1    Period  Months    Status  Partially Met      PT SHORT TERM GOAL #4   Title  Patient ambulates 400' with RW & prosthesis with supervision.     Baseline  MET 11/02/2018    Time  1    Period  Months    Status  Achieved      PT SHORT TERM GOAL #5   Title  Patient ambulates 100' with cane & prosthesis with minA.     Baseline  NOT MET 11/02/2018  Patient has residual limb pain with increased weight bearing with cane.     Time  1    Period  Months    Status  Not Met        PT Long Term Goals - 09/02/18 1800      PT LONG TERM GOAL #1   Title  Patient verbalizes & demonstrates proper prosthetic care including donning / doffing independently to enable safe use of prosthesis (All LTGs Target Date: 11/27/2018)    Time  3    Period  Months    Status  New    Target Date  11/27/18      PT LONG TERM GOAL #2   Title  Patient tolerates prosthesis wear daily for >90% of awake hours without skin or limb pain issues to enable function throughout his day.     Time  3    Period  Months    Status  New    Target Date  11/27/18      PT LONG TERM GOAL #3   Title  Berg Balance >36/56 to  indicate lower fall risk & less dependency in standing ADLs.     Time  3    Period  Months    Status  New    Target Date  11/27/18      PT LONG TERM GOAL #4   Title  Patient ambulates 500' with RW (or rollator) & prosthesis outdoors on paved or grass up to 50' surfaces modified independent for community mobility.     Time  3    Period  Months    Status  New    Target Date  11/27/18      PT LONG TERM GOAL #5   Title  Patient negotiates ramps, curbs with walker & stairs 1 rail /cane modified independent for community access.     Time  3    Period  Months    Status  New    Target Date  11/27/18            Plan - 11/06/18 2203    Clinical Impression Statement  Todays skilled session focused on gait training with RW/prosthesis and prosthetic care including ice massage on residual limb prior to gait. Pt continues to C/O pain in residual limb during gait training with small decrease after cryotherapy. Pt should have new prosthesis by next appt.     Rehab Potential  Good    PT Frequency  2x / week    PT Duration  Other (comment)    PT Treatment/Interventions  ADLs/Self Care Home Management;Canalith Repostioning;DME Instruction;Gait training;Stair training;Functional mobility training;Therapeutic activities;Therapeutic exercise;Balance training;Neuromuscular re-education;Patient/family education;Prosthetic Training;Manual techniques;Vestibular    PT Next Visit Plan  work towards [place], check residual limb pain    Consulted and Agree with Plan of Care  Patient       Patient will benefit from skilled therapeutic intervention in order to improve the following deficits and impairments:  Abnormal gait, Decreased activity tolerance, Decreased balance, Decreased endurance, Decreased knowledge of use of DME, Decreased mobility, Impaired flexibility, Decreased range of motion, Decreased strength, Decreased skin integrity, Dizziness, Postural dysfunction, Prosthetic Dependency  Visit  Diagnosis: Muscle weakness (generalized)  Other abnormalities of gait and mobility  Unsteadiness on feet     Problem List Patient Active Problem List   Diagnosis Date Noted  . Controlled type 2 diabetes mellitus without complication ([place]) 38/18/2993  . Bradycardia 09/01/2018  . Gangrene of left foot ([place]) 05/28/2018  . 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 Obstructive sleep apnea syndrome 05/04/2018  . Obstructive sleep apnea treated with continuous positive airway pressure (CPAP) 05/04/2018  . Type II diabetes mellitus, uncontrolled ([place]) 04/22/2018  . Atherosclerosis of native arteries of the extremities with gangrene ([place]) 04/09/2018  . Atherosclerosis of native artery of extremity ([place]) 04/09/2018  . Cellulitis in diabetic foot ([place]) 04/04/2018  . Paroxysmal atrial fibrillation ([place]) 12/05/2017  . Cardiac pacemaker in situ   . Symptomatic bradycardia   . Asystole ([place])   . 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  History of aortic valve replacement 12/02/2017  . Severe aortic stenosis   . Bilateral impacted cerumen 01/22/2016  . Essential hypertension 11/14/2015  . Peripheral vascular disease ([place]) 05/24/2014  . Chronic kidney disease 08/30/2013  . Hypothyroidism 08/30/2013  . 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 Diabetes mellitus type 2, uncomplicated ([place])   . Aortic valve stenosis 07/25/2011  . Hyperlipidemia 01/10/2011  . CAD (coronary artery disease) 01/10/2011  . Coronary arteriosclerosis 01/10/2011   Chassity Felts, PTA  Chassity A Felts 11/06/2018, 10:46 PM  [facility] [address] [place], [name], [address] Phone: [phone]   Fax:  [phone]  Name: Steven Maldonado MRN: [id number] Date of Birth: 1941/03/03

## 2018-11-13 ENCOUNTER — Other Ambulatory Visit: Payer: Self-pay | Admitting: Cardiovascular Disease

## 2018-11-13 ENCOUNTER — Ambulatory Visit: Payer: Medicare Other | Attending: Student | Admitting: Physical Therapy

## 2018-11-13 ENCOUNTER — Encounter: Payer: Self-pay | Admitting: Physical Therapy

## 2018-11-13 DIAGNOSIS — R2681 Unsteadiness on feet: Secondary | ICD-10-CM | POA: Diagnosis not present

## 2018-11-13 DIAGNOSIS — M6281 Muscle weakness (generalized): Secondary | ICD-10-CM

## 2018-11-13 DIAGNOSIS — Z9181 History of falling: Secondary | ICD-10-CM | POA: Diagnosis not present

## 2018-11-13 DIAGNOSIS — R2689 Other abnormalities of gait and mobility: Secondary | ICD-10-CM

## 2018-11-13 DIAGNOSIS — M6249 Contracture of muscle, multiple sites: Secondary | ICD-10-CM | POA: Insufficient documentation

## 2018-11-13 DIAGNOSIS — R293 Abnormal posture: Secondary | ICD-10-CM | POA: Diagnosis not present

## 2018-11-14 NOTE — Therapy (Addendum)
[facility] [address] [place], [name], [address] Phone: [phone]   Fax:  [phone]  Physical Therapy Treatment  Patient Details  Name: Steven Maldonado MRN: [id number] Date of Birth: 05/20/1941 Referring Provider (PT): Mechele Claude, [place]  Progress Note Reporting Period 10/06/2018 to 11/13/2018  See note below for Objective Data and Assessment of Progress/Goals.     Jamey Reas, PT, DPT PT Specializing in [place] 11/17/18 10:40 AM Phone:  [phone]  Fax:  [phone] [place] [address] [place] [address], [place] [address]   Encounter Date: 11/13/2018  PT End of Session - 11/13/18 1324    Visit Number  20    Number of Visits  26    Date for PT Re-Evaluation  12/02/17    Authorization Type  Medicare & Federal BCBS    Authorization Time Period  Medicare guidelines, Fed BCBS waves co-pay.  75 visit limit PT, OT & ST combined with 1 used.    Authorization - Visit Number  19    Authorization - Number of Visits  75    PT Start Time  1517    PT Stop Time  1400    PT Time Calculation (min)  43 min    Equipment Utilized During Treatment  Gait belt    Activity Tolerance  Patient tolerated treatment well;No increased pain    Behavior During Therapy  WFL for tasks assessed/performed       Past Medical History:  Diagnosis Date  . Chronic diastolic CHF (congestive heart failure) ([place])   . CKD (chronic kidney disease), stage III ([place])   . Constipation   . Coronary artery disease    a. Cath February 2012 in [facility], occluded RCA with collaterals  . DM type 2 (diabetes mellitus, type 2) ([place])   . Essential hypertension   . Hyperlipidemia   . Neuropathy    feet  . Pacemaker    a. symptomatic brady after TAVR s/p MDT PPM by Dr. Curt Bears 12/04/17  . Persistent atrial fibrillation   . PONV (postoperative nausea and vomiting)    after valve surgery  . PVD (peripheral  vascular disease) ([place])    a. s/p R popliteal artery stenosis tx with drug-coated balloon 05/2014, followed by Dr. Fletcher Anon.  . 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 Severe aortic stenosis    a. 12/02/17: s/p TAVR  . Skin cancer   . Sleep apnea with use of continuous positive airway pressure (CPAP)    04-11-11 AHI was 32.9 and titrated to 15 cm H20, DME is [facility]  . Subclinical hypothyroidism     Past Surgical History:  Procedure Laterality Date  . ABDOMINAL ANGIOGRAM N/A 06/08/2014   Procedure: ABDOMINAL ANGIOGRAM;  Surgeon: Wellington Hampshire, MD;  Location[facility] CATH LAB;  Service: Cardiovascular;  Laterality: N/A;  . ABDOMINAL AORTOGRAM N/A 04/09/2018   Procedure: ABDOMINAL AORTOGRAM;  Surgeon: Conrad , MD;  Location: [facility] CV LAB;  Service: Cardiovascular;  Laterality: N/A;  . AMPUTATION Left 04/17/2018   Procedure: LEFT FOOT 3RD RAY AMPUTATION;  Surgeon: Newt Minion, MD;  Location: [facility];  Service: Orthopedics;  Laterality: Left;  . AMPUTATION Left 05/28/2018   Procedure: LEFT AMPUTATION BELOW KNEE;  Surgeon: Wylene Simmer, MD;  Location: [facility];  Service: Orthopedics;  Laterality: Left;  . APPENDECTOMY  1965  .  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 CARDIAC CATHETERIZATION  12/2010  . CARDIOVERSION  07/2011  . CARDIOVERSION N/A 04/18/2014   Procedure: CARDIOVERSION;  Surgeon: Dorothy Spark, MD;  Location: [facility];  Service: Cardiovascular;  Laterality: N/A;  . CARDIOVERSION N/A 11/03/2015   Procedure: CARDIOVERSION;  Surgeon: Lelon Perla, MD;  Location: [facility] ENDOSCOPY;  Service: Cardiovascular;  Laterality: N/A;  . CARDIOVERSION N/A 05/08/2017   Procedure: CARDIOVERSION;  Surgeon: Dorothy Spark, MD;  Location: [facility] ENDOSCOPY;  Service: Cardiovascular;  Laterality: N/A;  . CARDIOVERSION N/A 07/28/2017   Procedure: CARDIOVERSION;  Surgeon:  Dorothy Spark, MD;  Location: [facility];  Service: Cardiovascular;  Laterality: N/A;  . LOWER EXTREMITY ANGIOGRAM N/A 06/08/2014   Procedure: LOWER EXTREMITY ANGIOGRAM;  Surgeon: Wellington Hampshire, MD;  Location: [facility] CATH LAB;  Service: Cardiovascular;  Laterality: N/A;  . LOWER EXTREMITY ANGIOGRAPHY Left 04/09/2018   Procedure: Lower Extremity Angiography;  Surgeon: Conrad Griffin, MD;  Location: [facility] CV LAB;  Service: Cardiovascular;  Laterality: Left;  . PACEMAKER IMPLANT N/A 12/04/2017   Procedure: PACEMAKER IMPLANT;  Surgeon: Constance Haw, MD;  Location: [facility] CV LAB;  Service: Cardiovascular;  Laterality: N/A;  . PERIPHERAL VASCULAR BALLOON ANGIOPLASTY Left 04/09/2018   Procedure: PERIPHERAL VASCULAR BALLOON ANGIOPLASTY;  Surgeon: Conrad Dungannon, MD;  Location: [facility] CV LAB;  Service: Cardiovascular;  Laterality: Left;  SFA  . POPLITEAL ARTERY ANGIOPLASTY Right 06/08/2014   Archie Endo 06/08/2014  . RIGHT/LEFT HEART CATH AND CORONARY ANGIOGRAPHY N/A 10/08/2017   Procedure: RIGHT/LEFT HEART CATH AND CORONARY ANGIOGRAPHY;  Surgeon: Burnell Blanks, MD;  Location: [facility] CV LAB;  Service: Cardiovascular;  Laterality: N/A;  . SKIN CANCER EXCISION Bilateral    "have had them cut off back of neck X 2; off left upper arm; right wrist, near right shoulder blade" (06/08/2014)  . TEE WITHOUT CARDIOVERSION N/A 12/02/2017   Procedure: TRANSESOPHAGEAL ECHOCARDIOGRAM (TEE);  Surgeon: Burnell Blanks, MD;  Location: [facility];  Service: Open Heart Surgery;  Laterality: N/A;  . TEMPORARY PACEMAKER N/A 12/04/2017   Procedure: TEMPORARY PACEMAKER;  Surgeon: Leonie Man, MD;  Location: [facility] CV LAB;  Service: Cardiovascular;  Laterality: N/A;  . TRANSCATHETER AORTIC VALVE REPLACEMENT, TRANSFEMORAL N/A 12/02/2017   Procedure: TRANSCATHETER AORTIC VALVE REPLACEMENT, TRANSFEMORAL;  Surgeon: Burnell Blanks, MD;  Location: [facility];  Service: Open Heart Surgery;   Laterality: N/A;  using Edwards Sapien 3 Transcatheter Heart Valve size 40m    There were no vitals filed for this visit.  Subjective Assessment - 11/13/18 1321    Subjective  Went to [facility] this am and has on temporary socket (clear fiberglass with casting material). Felt good when 1st put on, however after wearing to car and here he has the pain at the tibial area again.     Pertinent History  L TTA, CAD, PAF,  pacemaker, DM2, neuropathy, CKD, HTN,     Limitations  Lifting;Standing;Walking;House hold activities    Patient Stated Goals  To use prosthesis in community, travel (uses [place]ianne [place]i, take of himself so wife does not have to do it. Housework.     Currently in Pain?  Yes    Pain Score  2     Pain Location  Leg    Pain Orientation  Left;Anterior;Lateral    Pain Descriptors / Indicators  Aching;Sore    Pain Type  Acute pain    Pain Onset  More than a month ago  Pain Frequency  Constant    Aggravating Factors   wearing prosthesis    Pain Relieving Factors  removing prosthesis            [facility] Adult PT Treatment/Exercise - 11/13/18 1325      Transfers   Transfers  Sit to Stand;Stand to Sit    Sit to Stand  5: Supervision;With upper extremity assist;From chair/3-in-1    Sit to Stand Details (indicate cue type and reason)  needs UE support to stabilize once standing    Stand to Sit  5: Supervision;With upper extremity assist;To chair/3-in-1    Stand to Sit Details  use of RW to stabilize with sitting down      Ambulation/Gait   Ambulation/Gait  Yes    Ambulation/Gait Assistance  5: Supervision    Ambulation/Gait Assistance Details  cues on posture and base of support. no pain reported with any gait.     Ambulation Distance (Feet)  250 Feet   x1, 100 x2, plus around gym with activity   Assistive device  Rolling walker;Prosthesis    Gait Pattern  Step-through pattern;Decreased step length - right;Decreased stance time - left;Decreased stride length;Decreased hip/knee flexion -  left;Decreased weight shift to left;Antalgic;Trunk flexed;Step-to pattern    Ambulation Surface  Level;Unlevel    Stairs  Yes    Stairs Assistance  5: Supervision    Stairs Assistance Details (indicate cue type and reason)  cues on weight shifitng     Stair Management Technique  Two rails;Step to pattern;Forwards    Number of Stairs  4    Height of Stairs  6    Ramp  5: Supervision    Ramp Details (indicate cue type and reason)  with RW/prosthesis, pt able to recall technique    Curb  5: Supervision    Curb Details (indicate cue type and reason)  with RW/prosthesis with cues for staggered stance for increased stability with advancing RW      Prosthetics   Prosthetic Care Comments   ice massage x 3-4 minutes prior to activity over painful tibial creast area    Current prosthetic wear tolerance (days/week)   daily     Current prosthetic wear tolerance (#hours/day)   4hrs on 2hrs off with ice massage, 4hrs on.     Residual limb condition   intact with redness present over bony areas.     Education Provided  Residual limb care;Skin check    Person(s) Educated  Patient    Education Method  Explanation;Demonstration;Verbal cues    Education Method  Verbalized understanding;Returned demonstration;Verbal cues required;Needs further instruction    Donning Prosthesis  Supervision    Doffing Prosthesis  Independent         PT Short Term Goals - 11/03/18 2209      PT SHORT TERM GOAL #1   Title  Patient verbalizes adjusting ply socks with limb volume changes. (All Updated STGs Target Date: 10/30/2018)    Baseline  12/20: Pt able to adjust ply sock with limb volume change.     Time  1    Period  Months    Status  Achieved      PT SHORT TERM GOAL #2   Title  Patient tolerates prosthesis wear >12hrs total /day without skin issues.     Baseline  MET 11/02/2018    Time  1    Period  Months    Status  Achieved      PT SHORT TERM GOAL #3  Title  Patient reaches 5" & to knee level without  UE support with supervision.     Baseline  12/20:  pt able to reach 5" & to knee level with no UE support, min guard.     Time  1    Period  Months    Status  Partially Met      PT SHORT TERM GOAL #4   Title  Patient ambulates 400' with RW & prosthesis with supervision.     Baseline  MET 11/02/2018    Time  1    Period  Months    Status  Achieved      PT SHORT TERM GOAL #5   Title  Patient ambulates 100' with cane & prosthesis with minA.     Baseline  NOT MET 11/02/2018  Patient has residual limb pain with increased weight bearing with cane.     Time  1    Period  Months    Status  Not Met        PT Long Term Goals - 09/02/18 1800      PT LONG TERM GOAL #1   Title  Patient verbalizes & demonstrates proper prosthetic care including donning / doffing independently to enable safe use of prosthesis (All LTGs Target Date: 11/27/2018)    Time  3    Period  Months    Status  New    Target Date  11/27/18      PT LONG TERM GOAL #2   Title  Patient tolerates prosthesis wear daily for >90% of awake hours without skin or limb pain issues to enable function throughout his day.     Time  3    Period  Months    Status  New    Target Date  11/27/18      PT LONG TERM GOAL #3   Title  Berg Balance >36/56 to indicate lower fall risk & less dependency in standing ADLs.     Time  3    Period  Months    Status  New    Target Date  11/27/18      PT LONG TERM GOAL #4   Title  Patient ambulates 500' with RW (or rollator) & prosthesis outdoors on paved or grass up to 50' surfaces modified independent for community mobility.     Time  3    Period  Months    Status  New    Target Date  11/27/18      PT LONG TERM GOAL #5   Title  Patient negotiates ramps, curbs with walker & stairs 1 rail /cane modified independent for community access.     Time  3    Period  Months    Status  New    Target Date  11/27/18            Plan - 11/13/18 1324    Clinical Impression Statement  Today's  skilled session continued to address limb pain with ice massage prior to activity with no pain reported with gait or barriers afterwards. Pt did note some "pressure" after barriers that resolved with seated rest breaks. The pt was able to tolerate increased activity this session with new socket design (in temporary fiberglass socket) and performance of ice massage to limb). The pt is progressing toward goals and should benefit from continued PT to progress toward unmet goals.     Rehab Potential  Good    PT Frequency  2x / week  PT Duration  Other (comment)    PT Treatment/Interventions  ADLs/Self Care Home Management;Canalith Repostioning;DME Instruction;Gait training;Stair training;Functional mobility training;Therapeutic activities;Therapeutic exercise;Balance training;Neuromuscular re-education;Patient/family education;Prosthetic Training;Manual techniques;Vestibular    PT Next Visit Plan  work towards [place], check residual limb pain    Consulted and Agree with Plan of Care  Patient       Patient will benefit from skilled therapeutic intervention in order to improve the following deficits and impairments:  Abnormal gait, Decreased activity tolerance, Decreased balance, Decreased endurance, Decreased knowledge of use of DME, Decreased mobility, Impaired flexibility, Decreased range of motion, Decreased strength, Decreased skin integrity, Dizziness, Postural dysfunction, Prosthetic Dependency  Visit Diagnosis: Muscle weakness (generalized)  Other abnormalities of gait and mobility  Unsteadiness on feet     Problem List Patient Active Problem List   Diagnosis Date Noted  . Controlled type 2 diabetes mellitus without complication ([place]) 65/53/7482  . Bradycardia 09/01/2018  . Gangrene of left foot ([place]) 05/28/2018  . 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 Obstructive sleep apnea syndrome 05/04/2018  . Obstructive sleep apnea treated with  continuous positive airway pressure (CPAP) 05/04/2018  . Type II diabetes mellitus, uncontrolled ([place]) 04/22/2018  . Atherosclerosis of native arteries of the extremities with gangrene ([place]) 04/09/2018  . Atherosclerosis of native artery of extremity ([place]) 04/09/2018  . Cellulitis in diabetic foot ([place]) 04/04/2018  . Paroxysmal atrial fibrillation ([place]) 12/05/2017  . Cardiac pacemaker in situ   . Symptomatic bradycardia   . Asystole ([place])   . 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 History of aortic valve replacement 12/02/2017  . Severe aortic stenosis   . Bilateral impacted cerumen 01/22/2016  . Essential hypertension 11/14/2015  . Peripheral vascular disease ([place]) 05/24/2014  . Chronic kidney disease 08/30/2013  . Hypothyroidism 08/30/2013  . 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 Diabetes mellitus type 2, uncomplicated ([place])   . Aortic valve stenosis 07/25/2011  . Hyperlipidemia 01/10/2011  . CAD (coronary artery disease) 01/10/2011  . Coronary arteriosclerosis 01/10/2011    Willow Ora, PTA, [facility] [address], [place], [place] [address] [phone] 11/14/18, 7:02 PM   Name: Steven Maldonado MRN: [id number] Date of Birth: 1941-08-31

## 2018-11-17 ENCOUNTER — Ambulatory Visit: Payer: Medicare Other | Admitting: Physical Therapy

## 2018-11-17 ENCOUNTER — Encounter: Payer: Self-pay | Admitting: Physical Therapy

## 2018-11-17 DIAGNOSIS — M6281 Muscle weakness (generalized): Secondary | ICD-10-CM | POA: Diagnosis not present

## 2018-11-17 DIAGNOSIS — M6249 Contracture of muscle, multiple sites: Secondary | ICD-10-CM | POA: Diagnosis not present

## 2018-11-17 DIAGNOSIS — R293 Abnormal posture: Secondary | ICD-10-CM | POA: Diagnosis not present

## 2018-11-17 DIAGNOSIS — R2681 Unsteadiness on feet: Secondary | ICD-10-CM

## 2018-11-17 DIAGNOSIS — Z9181 History of falling: Secondary | ICD-10-CM | POA: Diagnosis not present

## 2018-11-17 DIAGNOSIS — R2689 Other abnormalities of gait and mobility: Secondary | ICD-10-CM

## 2018-11-17 NOTE — Patient Instructions (Signed)
Access Code: [email or web address]  URL: [email or web address]  Date: 11/17/2018  Prepared by: Willow Ora   Exercises  Sitting Knee Extension with Resistance - 10 reps - 1 sets - 1x daily - 5x weekly  Seated Hamstring Curls with Resistance - 10 reps - 1 sets - 1x daily - 5x weekly  Seated March with Resistance - 10 reps - 1 sets - 1x daily - 5x weekly  Seated Shoulder Horizontal Abduction with Resistance - 10 reps - 1 sets - 1x daily - 5x weekly

## 2018-11-17 NOTE — Therapy (Signed)
[facility] [address] [place], [name], [address] Phone: [phone]   Fax:  [phone]  Physical Therapy Treatment  Patient Details  Name: Steven Maldonado MRN: [id number] Date of Birth: Jun 17, 1941 Referring Provider (PT): Mechele Claude, [place]   Encounter Date: 11/17/2018  PT End of Session - 11/17/18 1524    Visit Number  21    Number of Visits  26    Date for PT Re-Evaluation  12/02/17    Authorization Type  Medicare & Federal BCBS    Authorization Time Period  Medicare guidelines, Fed BCBS waves co-pay.  75 visit limit PT, OT & ST combined with 1 used.    Authorization - Visit Number  20    Authorization - Number of Visits  75    PT Start Time  1520    PT Stop Time  7341    PT Time Calculation (min)  44 min    Equipment Utilized During Treatment  Gait belt    Activity Tolerance  Patient tolerated treatment well    Behavior During Therapy  WFL for tasks assessed/performed       Past Medical History:  Diagnosis Date  . Chronic diastolic CHF (congestive heart failure) ([place])   . CKD (chronic kidney disease), stage III ([place])   . Constipation   . Coronary artery disease    a. Cath February 2012 in [facility], occluded RCA with collaterals  . DM type 2 (diabetes mellitus, type 2) ([place])   . Essential hypertension   . Hyperlipidemia   . Neuropathy    feet  . Pacemaker    a. symptomatic brady after TAVR s/p MDT PPM by Dr. Curt Bears 12/04/17  . Persistent atrial fibrillation   . PONV (postoperative nausea and vomiting)    after valve surgery  . PVD (peripheral vascular disease) ([place])    a. s/p R popliteal artery stenosis tx with drug-coated balloon 05/2014, followed by Dr. Fletcher Anon.  . 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 Severe aortic stenosis    a. 12/02/17: s/p TAVR  . Skin cancer   .  Sleep apnea with use of continuous positive airway pressure (CPAP)    04-11-11 AHI was 32.9 and titrated to 15 cm H20, DME is [facility]  . Subclinical hypothyroidism     Past Surgical History:  Procedure Laterality Date  . ABDOMINAL ANGIOGRAM N/A 06/08/2014   Procedure: ABDOMINAL ANGIOGRAM;  Surgeon: Wellington Hampshire, MD;  [facility] C[facility] LAB;  Service: Cardiovascular;  Laterality: N/A;  . ABDOMINAL AORTOGRAM N/A 04/09/2018   Procedure: ABDOMINAL AORTOGRAM;  Surgeon: Conrad [place], MD;  Location: [facility] CV LAB;  Service: Cardiovascular;  Laterality: N/A;  . AMPUTATION Left 04/17/2018   Procedure: LEFT FOOT 3RD RAY AMPUTATION;  Surgeon: Newt Minion, MD;  Location: [facility];  Service: Orthopedics;  Laterality: Left;  . AMPUTATION Left 05/28/2018   Procedure: LEFT AMPUTATION BELOW KNEE;  Surgeon: Wylene Simmer, MD;  Location: [facility];  Service: Orthopedics;  Laterality: Left;  . APPENDECTOMY  1965  . 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 CARDIAC CATHETERIZATION  12/2010  . CARDIOVERSION  07/2011  . CARDIOVERSION N/A 04/18/2014   Procedure: CARDIOVERSION;  Surgeon: Dorothy Spark, MD;  Location: [facility] ENDOSCOPY;  Service: Cardiovascular;  Laterality: N/A;  . CARDIOVERSION N/A 11/03/2015   Procedure: CARDIOVERSION;  Surgeon: Lelon Perla,  MD;  Location: [facility];  Service: Cardiovascular;  Laterality: N/A;  . CARDIOVERSION N/A 05/08/2017   Procedure: CARDIOVERSION;  Surgeon: Dorothy Spark, MD;  Location: [facility] ENDOSCOPY;  Service: Cardiovascular;  Laterality: N/A;  . CARDIOVERSION N/A 07/28/2017   Procedure: CARDIOVERSION;  Surgeon: Dorothy Spark, MD;  Location: [facility];  Service: Cardiovascular;  Laterality: N/A;  . LOWER EXTREMITY ANGIOGRAM N/A 06/08/2014   Procedure: LOWER EXTREMITY ANGIOGRAM;  Surgeon: Wellington Hampshire, MD;  Location: [facility] CATH LAB;  Service: Cardiovascular;  Laterality: N/A;  . LOWER EXTREMITY ANGIOGRAPHY Left 04/09/2018   Procedure: Lower Extremity  Angiography;  Surgeon: Conrad [place], MD;  Location: [facility] CV LAB;  Service: Cardiovascular;  Laterality: Left;  . PACEMAKER IMPLANT N/A 12/04/2017   Procedure: PACEMAKER IMPLANT;  Surgeon: Constance Haw, MD;  Location: [facility] CV LAB;  Service: Cardiovascular;  Laterality: N/A;  . PERIPHERAL VASCULAR BALLOON ANGIOPLASTY Left 04/09/2018   Procedure: PERIPHERAL VASCULAR BALLOON ANGIOPLASTY;  Surgeon: Conrad Inkster, MD;  Location: [facility] CV LAB;  Service: Cardiovascular;  Laterality: Left;  SFA  . POPLITEAL ARTERY ANGIOPLASTY Right 06/08/2014   Archie Endo 06/08/2014  . RIGHT/LEFT HEART CATH AND CORONARY ANGIOGRAPHY N/A 10/08/2017   Procedure: RIGHT/LEFT HEART CATH AND CORONARY ANGIOGRAPHY;  Surgeon: Burnell Blanks, MD;  Location: [facility] CV LAB;  Service: Cardiovascular;  Laterality: N/A;  . SKIN CANCER EXCISION Bilateral    "have had them cut off back of neck X 2; off left upper arm; right wrist, near right shoulder blade" (06/08/2014)  . TEE WITHOUT CARDIOVERSION N/A 12/02/2017   Procedure: TRANSESOPHAGEAL ECHOCARDIOGRAM (TEE);  Surgeon: Burnell Blanks, MD;  Location: [facility];  Service: Open Heart Surgery;  Laterality: N/A;  . TEMPORARY PACEMAKER N/A 12/04/2017   Procedure: TEMPORARY PACEMAKER;  Surgeon: Leonie Man, MD;  Location: [facility] CV LAB;  Service: Cardiovascular;  Laterality: N/A;  . TRANSCATHETER AORTIC VALVE REPLACEMENT, TRANSFEMORAL N/A 12/02/2017   Procedure: TRANSCATHETER AORTIC VALVE REPLACEMENT, TRANSFEMORAL;  Surgeon: Burnell Blanks, MD;  Location: [facility];  Service: Open Heart Surgery;  Laterality: N/A;  using Edwards Sapien 3 Transcatheter Heart Valve size 49m    There were no vitals filed for this visit.  Subjective Assessment - 11/17/18 1522    Subjective  Goes back to [facility] tomorrow to get hopefully new socket. Does still have the new thicker liner on.     Pertinent History  L TTA, CAD, PAF,  pacemaker, DM2, neuropathy,  CKD, HTN,     Limitations  Lifting;Standing;Walking;House hold activities    Patient Stated Goals  To use prosthesis in community, travel (uses [place]ianne [place]i, take of himself so wife does not have to do it. Housework.     Currently in Pain?  Yes    Pain Score  1     Pain Location  Leg    Pain Orientation  Left;Anterior;Lateral    Pain Descriptors / Indicators  Aching;Sore    Pain Type  Acute pain    Pain Onset  More than a month ago    Pain Frequency  Constant    Aggravating Factors   wearing prosthesis    Pain Relieving Factors  removing prosthesis            [facility] Adult PT Treatment/Exercise - 11/17/18 1525      Transfers   Transfers  Sit to Stand;Stand to Sit    Sit to Stand  5: Supervision;With upper extremity assist;From chair/3-in-1    Sit to Stand  Details (indicate cue type and reason)  needs UE support for stability with standing    Stand to Sit  5: Supervision;With upper extremity assist;To chair/3-in-1    Stand to Sit Details  Needs UE support for stability with sitting down      Ambulation/Gait   Ambulation/Gait  Yes    Ambulation/Gait Assistance  5: Supervision    Ambulation/Gait Assistance Details  cues on posture and for incr/equal step length with gait.     Ambulation Distance (Feet)  230 Feet   x2, plus around gym with session   Assistive device  Rolling walker;Prosthesis    Gait Pattern  Step-through pattern;Decreased step length - right;Decreased stance time - left;Decreased stride length;Decreased hip/knee flexion - left;Decreased weight shift to left;Antalgic;Trunk flexed;Step-to pattern    Ambulation Surface  Level;Indoor    Stairs  Yes    Stairs Assistance  5: Supervision    Stairs Assistance Details (indicate cue type and reason)  cues to advance hands on rails and for weight shifting.     Stair Management Technique  Two rails;Step to pattern;Forwards    Number of Stairs  4    Height of Stairs  6      Exercises   Other Exercises   educated on and issued  seated theraband ex's for strengthening.       Prosthetics   Prosthetic Care Comments   ice massage x 5-6 minutes after session over painful tibial creast area and lateral distal limb area (2-3 minutes each area)    Current prosthetic wear tolerance (days/week)   daily     Current prosthetic wear tolerance (#hours/day)   4hrs on 2hrs off with ice massage, 4hrs on.     Residual limb condition   intact per report.    Education Provided  Residual limb care    Person(s) Educated  Patient    Education Method  Explanation;Demonstration;Verbal cues    Education Method  Verbalized understanding;Verbal cues required;Needs further instruction    Donning Prosthesis  Supervision    Doffing Prosthesis  Independent             PT Education - 11/17/18 1546    Education Details  HEP for strengthening with green band.    Person(s) Educated  Patient    Methods  Explanation;Demonstration;Verbal cues;Handout    Comprehension  Verbalized understanding;Returned demonstration;Verbal cues required;Need further instruction       PT Short Term Goals - 11/03/18 2209      PT SHORT TERM GOAL #1   Title  Patient verbalizes adjusting ply socks with limb volume changes. (All Updated STGs Target Date: 10/30/2018)    Baseline  12/20: Pt able to adjust ply sock with limb volume change.     Time  1    Period  Months    Status  Achieved      PT SHORT TERM GOAL #2   Title  Patient tolerates prosthesis wear >12hrs total /day without skin issues.     Baseline  MET 11/02/2018    Time  1    Period  Months    Status  Achieved      PT SHORT TERM GOAL #3   Title  Patient reaches 5" & to knee level without UE support with supervision.     Baseline  12/20:  pt able to reach 5" & to knee level with no UE support, min guard.     Time  1    Period  Months  Status  Partially Met      PT SHORT TERM GOAL #4   Title  Patient ambulates 400' with RW & prosthesis with supervision.     Baseline  MET 11/02/2018     Time  1    Period  Months    Status  Achieved      PT SHORT TERM GOAL #5   Title  Patient ambulates 100' with cane & prosthesis with minA.     Baseline  NOT MET 11/02/2018  Patient has residual limb pain with increased weight bearing with cane.     Time  1    Period  Months    Status  Not Met        PT Long Term Goals - 09/02/18 1800      PT LONG TERM GOAL #1   Title  Patient verbalizes & demonstrates proper prosthetic care including donning / doffing independently to enable safe use of prosthesis (All LTGs Target Date: 11/27/2018)    Time  3    Period  Months    Status  New    Target Date  11/27/18      PT LONG TERM GOAL #2   Title  Patient tolerates prosthesis wear daily for >90% of awake hours without skin or limb pain issues to enable function throughout his day.     Time  3    Period  Months    Status  New    Target Date  11/27/18      PT LONG TERM GOAL #3   Title  Berg Balance >36/56 to indicate lower fall risk & less dependency in standing ADLs.     Time  3    Period  Months    Status  New    Target Date  11/27/18      PT LONG TERM GOAL #4   Title  Patient ambulates 500' with RW (or rollator) & prosthesis outdoors on paved or grass up to 50' surfaces modified independent for community mobility.     Time  3    Period  Months    Status  New    Target Date  11/27/18      PT LONG TERM GOAL #5   Title  Patient negotiates ramps, curbs with walker & stairs 1 rail /cane modified independent for community access.     Time  3    Period  Months    Status  New    Target Date  11/27/18            Plan - 11/17/18 1524    Clinical Impression Statement  Today's skilled session continued to focus on mobility with prosthesis/RW (limited due to limb pain with weight bearing in prosthesis) and on strengthening. Issued small HEP with green band for strengthening. Can add to it as pt progresses and is not sore with the current ones. The pt is progressing toward goals and  should benefit from continued PT to progress toward unmet goals.     Rehab Potential  Good    PT Frequency  2x / week    PT Duration  Other (comment)    PT Treatment/Interventions  ADLs/Self Care Home Management;Canalith Repostioning;DME Instruction;Gait training;Stair training;Functional mobility training;Therapeutic activities;Therapeutic exercise;Balance training;Neuromuscular re-education;Patient/family education;Prosthetic Training;Manual techniques;Vestibular    PT Next Visit Plan  work towards [place], check residual limb pain, pt should have new socket at next appt.     Consulted and Agree with Plan of Care  Patient  Patient will benefit from skilled therapeutic intervention in order to improve the following deficits and impairments:  Abnormal gait, Decreased activity tolerance, Decreased balance, Decreased endurance, Decreased knowledge of use of DME, Decreased mobility, Impaired flexibility, Decreased range of motion, Decreased strength, Decreased skin integrity, Dizziness, Postural dysfunction, Prosthetic Dependency  Visit Diagnosis: Muscle weakness (generalized)  Other abnormalities of gait and mobility  Unsteadiness on feet  Abnormal posture     Problem List Patient Active Problem List   Diagnosis Date Noted  . Controlled type 2 diabetes mellitus without complication ([place]) 84/21/0312  . Bradycardia 09/01/2018  . Gangrene of left foot ([place]) 05/28/2018  . 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 Obstructive sleep apnea syndrome 05/04/2018  . Obstructive sleep apnea treated with continuous positive airway pressure (CPAP) 05/04/2018  . Type II diabetes mellitus, uncontrolled ([place]) 04/22/2018  . Atherosclerosis of native arteries of the extremities with gangrene ([place]) 04/09/2018  . Atherosclerosis of native artery of extremity ([place]) 04/09/2018  . Cellulitis in diabetic foot ([place]) 04/04/2018  . Paroxysmal atrial fibrillation ([place])  12/05/2017  . Cardiac pacemaker in situ   . Symptomatic bradycardia   . Asystole ([place])   . 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 History of aortic valve replacement 12/02/2017  . Severe aortic stenosis   . Bilateral impacted cerumen 01/22/2016  . Essential hypertension 11/14/2015  . Peripheral vascular disease ([place]) 05/24/2014  . Chronic kidney disease 08/30/2013  . Hypothyroidism 08/30/2013  . 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 Diabetes mellitus type 2, uncomplicated ([place])   . Aortic valve stenosis 07/25/2011  . Hyperlipidemia 01/10/2011  . CAD (coronary artery disease) 01/10/2011  . Coronary arteriosclerosis 01/10/2011    Willow Ora, PTA, [facility] [address], [place], [place] [address] [phone] 11/17/18, 4:11 PM   Name: Steven Maldonado MRN: [id number] Date of Birth: May 21, 1941

## 2018-11-20 ENCOUNTER — Encounter: Payer: Self-pay | Admitting: Physical Therapy

## 2018-11-20 ENCOUNTER — Ambulatory Visit: Payer: Medicare Other | Admitting: Physical Therapy

## 2018-11-20 DIAGNOSIS — R293 Abnormal posture: Secondary | ICD-10-CM

## 2018-11-20 DIAGNOSIS — M6281 Muscle weakness (generalized): Secondary | ICD-10-CM

## 2018-11-20 DIAGNOSIS — R2681 Unsteadiness on feet: Secondary | ICD-10-CM

## 2018-11-20 DIAGNOSIS — M6249 Contracture of muscle, multiple sites: Secondary | ICD-10-CM | POA: Diagnosis not present

## 2018-11-20 DIAGNOSIS — R2689 Other abnormalities of gait and mobility: Secondary | ICD-10-CM | POA: Diagnosis not present

## 2018-11-20 DIAGNOSIS — Z9181 History of falling: Secondary | ICD-10-CM | POA: Diagnosis not present

## 2018-11-22 NOTE — Therapy (Signed)
[facility] [address] [place], [name], [address] Phone: [phone]   Fax:  [phone]  Physical Therapy Treatment  Patient Details  Name: Steven Maldonado MRN: [id number] Date of Birth: 1941-02-17 Referring Provider (PT): Mechele Claude, [place]   Encounter Date: 11/20/2018   11/20/18 1325  PT Visits / Re-Eval  Visit Number 22  Number of Visits 26  Date for PT Re-Evaluation 12/02/17  Authorization  Authorization Type Medicare & Federal BCBS  Authorization Time Period Medicare guidelines, Fed BCBS waves co-pay.  75 visit limit PT, OT & ST combined with 1 used.  Authorization - Visit Number 3  Authorization - Number of Visits 75  PT Time Calculation  PT Start Time 1320  PT Stop Time 1400  PT Time Calculation (min) 40 min  PT - End of Session  Equipment Utilized During Treatment Gait belt  Activity Tolerance Patient tolerated treatment well  Behavior During Therapy WFL for tasks assessed/performed     Past Medical History:  Diagnosis Date  . Chronic diastolic CHF (congestive heart failure) ([place])   . CKD (chronic kidney disease), stage III ([place])   . Constipation   . Coronary artery disease    a. Cath February 2012 in [facility], occluded RCA with collaterals  . DM type 2 (diabetes mellitus, type 2) ([place])   . Essential hypertension   . Hyperlipidemia   . Neuropathy    feet  . Pacemaker    a. symptomatic brady after TAVR s/p MDT PPM by Dr. Curt Bears 12/04/17  . Persistent atrial fibrillation   . PONV (postoperative nausea and vomiting)    after valve surgery  . PVD (peripheral vascular disease) ([place])    a. s/p R popliteal artery stenosis tx with drug-coated balloon 05/2014, followed by Dr. Fletcher Anon.  . 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 Severe aortic stenosis    a. 12/02/17: s/p TAVR  .  Skin cancer   . Sleep apnea with use of continuous positive airway pressure (CPAP)    04-11-11 AHI was 32.9 and titrated to 15 cm H20, DME is [facility]  . Subclinical hypothyroidism     Past Surgical History:  Procedure Laterality Date  . ABDOMINAL ANGIOGRAM N/A 06/08/2014   Procedure: ABDOMINAL ANGIOGRAM;  Surgeon: Wellington Hampshire, MD;  Location: [facility] CATH LAB;  Service: Cardiovascular;  Laterality: N/A;  . ABDOMINAL AORTOGRAM N/A 04/09/2018   Procedure: ABDOMINAL AORTOGRAM;  Surgeon: Conrad Haigler Creek, MD;  Location: [facility] CV LAB;  Service: Cardiovascular;  Laterality: N/A;  . AMPUTATION Left 04/17/2018   Procedure: LEFT FOOT 3RD RAY AMPUTATION;  Surgeon: Newt Minion, MD;  Location: [facility];  Service: Orthopedics;  Laterality: Left;  . AMPUTATION Left 05/28/2018   Procedure: LEFT AMPUTATION BELOW KNEE;  Surgeon: Wylene Simmer, MD;  Location: [facility];  Service: Orthopedics;  Laterality: Left;  . APPENDECTOMY  1965  . 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 CARDIAC CATHETERIZATION  12/2010  . CARDIOVERSION  07/2011  . CARDIOVERSION N/A 04/18/2014   Procedure: CARDIOVERSION;  Surgeon: Dorothy Spark, MD;  Location: [facility] ENDOSCOPY;  Service: Cardiovascular;  Laterality: N/A;  . CARDIOVERSION N/A 11/03/2015   Procedure: CARDIOVERSION;  Surgeon: Lelon Perla, MD;  Location: [facility] ENDOSCOPY;  Service: Cardiovascular;  Laterality: N/A;  . CARDIOVERSION N/A 05/08/2017   Procedure: CARDIOVERSION;  Surgeon: Dorothy Spark, MD;  Location:  [facility] ENDOSCOPY;  Service: Cardiovascular;  Laterality: N/A;  . CARDIOVERSION N/A 07/28/2017   Procedure: CARDIOVERSION;  Surgeon: Dorothy Spark, MD;  Location: [facility];  Service: Cardiovascular;  Laterality: N/A;  . LOWER EXTREMITY ANGIOGRAM N/A 06/08/2014   Procedure: LOWER EXTREMITY ANGIOGRAM;  Surgeon: Wellington Hampshire, MD;  Location: [facility] CATH LAB;  Service: Cardiovascular;  Laterality: N/A;  . LOWER EXTREMITY ANGIOGRAPHY Left 04/09/2018   Procedure: Lower  Extremity Angiography;  Surgeon: Conrad Swanton, MD;  Location: [facility] CV LAB;  Service: Cardiovascular;  Laterality: Left;  . PACEMAKER IMPLANT N/A 12/04/2017   Procedure: PACEMAKER IMPLANT;  Surgeon: Constance Haw, MD;  Location: [facility] CV LAB;  Service: Cardiovascular;  Laterality: N/A;  . PERIPHERAL VASCULAR BALLOON ANGIOPLASTY Left 04/09/2018   Procedure: PERIPHERAL VASCULAR BALLOON ANGIOPLASTY;  Surgeon: Conrad Lyerly, MD;  Location: [facility] CV LAB;  Service: Cardiovascular;  Laterality: Left;  SFA  . POPLITEAL ARTERY ANGIOPLASTY Right 06/08/2014   Archie Endo 06/08/2014  . RIGHT/LEFT HEART CATH AND CORONARY ANGIOGRAPHY N/A 10/08/2017   Procedure: RIGHT/LEFT HEART CATH AND CORONARY ANGIOGRAPHY;  Surgeon: Burnell Blanks, MD;  Location: [facility] CV LAB;  Service: Cardiovascular;  Laterality: N/A;  . SKIN CANCER EXCISION Bilateral    "have had them cut off back of neck X 2; off left upper arm; right wrist, near right shoulder blade" (06/08/2014)  . TEE WITHOUT CARDIOVERSION N/A 12/02/2017   Procedure: TRANSESOPHAGEAL ECHOCARDIOGRAM (TEE);  Surgeon: Burnell Blanks, MD;  Location: [facility];  Service: Open Heart Surgery;  Laterality: N/A;  . TEMPORARY PACEMAKER N/A 12/04/2017   Procedure: TEMPORARY PACEMAKER;  Surgeon: Leonie Man, MD;  Location: [facility] CV LAB;  Service: Cardiovascular;  Laterality: N/A;  . TRANSCATHETER AORTIC VALVE REPLACEMENT, TRANSFEMORAL N/A 12/02/2017   Procedure: TRANSCATHETER AORTIC VALVE REPLACEMENT, TRANSFEMORAL;  Surgeon: Burnell Blanks, MD;  Location: [facility];  Service: Open Heart Surgery;  Laterality: N/A;  using Edwards Sapien 3 Transcatheter Heart Valve size 34m    There were no vitals filed for this visit.     11/20/18 1323  Symptoms/Limitations  Subjective Has new socket on. Was great yesterday, today it's hurting on the tibial area again. Pt expressing frustration over the set back as he started out "so good, on the  cane" and now is back on the walker due to pain.   Pertinent History L TTA, CAD, PAF,  pacemaker, DM2, neuropathy, CKD, HTN,   Limitations Lifting;Standing;Walking;House hold activities  Patient Stated Goals To use prosthesis in community, travel (uses vLucianne Lei, take of himself so wife does not have to do it. Housework.   Pain Assessment  Currently in Pain? Yes  Pain Score 6  Pain Location Leg  Pain Orientation Left;Anterior  Pain Descriptors / Indicators Tender;Aching;Sore  Pain Type Acute pain  Pain Onset More than a month ago  Pain Frequency Constant  Aggravating Factors  increased wear of the prosthesis  Pain Relieving Factors removing prosthesis,       11/20/18 1326  Transfers  Transfers Sit to Stand;Stand to Sit  Sit to Stand 5: Supervision;With upper extremity assist;From chair/3-in-1  Stand to Sit 5: Supervision;With upper extremity assist;To chair/3-in-1  Ambulation/Gait  Ambulation/Gait Yes  Ambulation/Gait Assistance 5: Supervision  Ambulation/Gait Assistance Details cues on posture and for incr/equal step length  Ambulation Distance (Feet) 210 Feet (x1, 250 x2, plus around gym)  Assistive device Rolling walker;Prosthesis  Gait Pattern Step-through pattern;Decreased stride length;Decreased trunk rotation;Trunk flexed  Ambulation Surface Level;Indoor  Stairs  Yes  Stairs Assistance 5: Supervision  Stairs Assistance Details (indicate cue type and reason) continues to need cues on weight shifting   Stair Management Technique Two rails;Step to pattern;Forwards  Number of Stairs 4  Height of Stairs 6  Ramp 5: Supervision  Ramp Details (indicate cue type and reason) with RW/prosthesis  Curb 5: Supervision  Curb Details (indicate cue type and reason) with RW/prosthesis  Prosthetics  Prosthetic Care Comments  decreased the sock ply pt is wearing from 3 ply to single ply with improved comfort/decreaased pain reported.   Current prosthetic wear tolerance (days/week)  daily    Current prosthetic wear tolerance (#hours/day)  4hrs on 2hrs off with ice massage, 4hrs on.   Residual limb condition  intact with no issues  Education Provided Residual limb care;Correct ply sock adjustment;Proper wear schedule/adjustment;Proper weight-bearing schedule/adjustment (slowly incr activity with new socket)  Person(s) Educated Patient  Education Method Explanation;Demonstration;Verbal cues  Education Method Verbalized understanding;Returned demonstration;Verbal cues required;Needs further instruction  Donning Prosthesis 5  Doffing Prosthesis 7       PT Short Term Goals - 11/03/18 2209      PT SHORT TERM GOAL #1   Title  Patient verbalizes adjusting ply socks with limb volume changes. (All Updated STGs Target Date: 10/30/2018)    Baseline  12/20: Pt able to adjust ply sock with limb volume change.     Time  1    Period  Months    Status  Achieved      PT SHORT TERM GOAL #2   Title  Patient tolerates prosthesis wear >12hrs total /day without skin issues.     Baseline  MET 11/02/2018    Time  1    Period  Months    Status  Achieved      PT SHORT TERM GOAL #3   Title  Patient reaches 5" & to knee level without UE support with supervision.     Baseline  12/20:  pt able to reach 5" & to knee level with no UE support, min guard.     Time  1    Period  Months    Status  Partially Met      PT SHORT TERM GOAL #4   Title  Patient ambulates 400' with RW & prosthesis with supervision.     Baseline  MET 11/02/2018    Time  1    Period  Months    Status  Achieved      PT SHORT TERM GOAL #5   Title  Patient ambulates 100' with cane & prosthesis with minA.     Baseline  NOT MET 11/02/2018  Patient has residual limb pain with increased weight bearing with cane.     Time  1    Period  Months    Status  Not Met        PT Long Term Goals - 09/02/18 1800      PT LONG TERM GOAL #1   Title  Patient verbalizes & demonstrates proper prosthetic care including donning /  doffing independently to enable safe use of prosthesis (All LTGs Target Date: 11/27/2018)    Time  3    Period  Months    Status  New    Target Date  11/27/18      PT LONG TERM GOAL #2   Title  Patient tolerates prosthesis wear daily for >90% of awake hours without skin or limb pain issues to enable function throughout his day.  Time  3    Period  Months    Status  New    Target Date  11/27/18      PT LONG TERM GOAL #3   Title  Berg Balance >36/56 to indicate lower fall risk & less dependency in standing ADLs.     Time  3    Period  Months    Status  New    Target Date  11/27/18      PT LONG TERM GOAL #4   Title  Patient ambulates 500' with RW (or rollator) & prosthesis outdoors on paved or grass up to 50' surfaces modified independent for community mobility.     Time  3    Period  Months    Status  New    Target Date  11/27/18      PT LONG TERM GOAL #5   Title  Patient negotiates ramps, curbs with walker & stairs 1 rail /cane modified independent for community access.     Time  3    Period  Months    Status  New    Target Date  11/27/18         11/20/18 1325  Plan  Clinical Impression Statement Today's skilled session continued to focus on prosthetic management. Pt with improved fit and comfort with decreased sock ply on. Also continued to address gait/barriers with RW/prosthesis. The pt tolerated increased activity today with increased gait distances without increased pain. The pt is progressing toward goals and should benefit from continued PT to progress toward unmet goals.   Pt will benefit from skilled therapeutic intervention in order to improve on the following deficits Abnormal gait;Decreased activity tolerance;Decreased balance;Decreased endurance;Decreased knowledge of use of DME;Decreased mobility;Impaired flexibility;Decreased range of motion;Decreased strength;Decreased skin integrity;Dizziness;Postural dysfunction;Prosthetic Dependency  Rehab Potential Good   PT Frequency 2x / week  PT Duration Other (comment)  PT Treatment/Interventions ADLs/Self Care Home Management;Canalith Repostioning;DME Instruction;Gait training;Stair training;Functional mobility training;Therapeutic activities;Therapeutic exercise;Balance training;Neuromuscular re-education;Patient/family education;Prosthetic Training;Manual techniques;Vestibular  PT Next Visit Plan work towards [place], check residual limb pain, see how new socket is doing with mobility  Consulted and Agree with Plan of Care Patient           Patient will benefit from skilled therapeutic intervention in order to improve the following deficits and impairments:  Abnormal gait, Decreased activity tolerance, Decreased balance, Decreased endurance, Decreased knowledge of use of DME, Decreased mobility, Impaired flexibility, Decreased range of motion, Decreased strength, Decreased skin integrity, Dizziness, Postural dysfunction, Prosthetic Dependency  Visit Diagnosis: Muscle weakness (generalized)  Other abnormalities of gait and mobility  Unsteadiness on feet  Abnormal posture     Problem List Patient Active Problem List   Diagnosis Date Noted  . Controlled type 2 diabetes mellitus without complication ([place]) 09/73/5329  . Bradycardia 09/01/2018  . Gangrene of left foot ([place]) 05/28/2018  . 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 Obstructive sleep apnea syndrome 05/04/2018  . Obstructive sleep apnea treated with continuous positive airway pressure (CPAP) 05/04/2018  . Type II diabetes mellitus, uncontrolled ([place]) 04/22/2018  . Atherosclerosis of native arteries of the extremities with gangrene ([place]) 04/09/2018  . Atherosclerosis of native artery of extremity ([place]) 04/09/2018  . Cellulitis in diabetic foot ([place]) 04/04/2018  . Paroxysmal atrial fibrillation ([place]) 12/05/2017  . Cardiac pacemaker in situ   . Symptomatic bradycardia   . Asystole ([place])   . S/P  TAVR (transcatheter aortic valve replacement) 12/02/2017  . History  of aortic valve replacement 12/02/2017  . Severe aortic stenosis   . Bilateral impacted cerumen 01/22/2016  . Essential hypertension 11/14/2015  . Peripheral vascular disease ([place]) 05/24/2014  . Chronic kidney disease 08/30/2013  . Hypothyroidism 08/30/2013  . 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 Diabetes mellitus type 2, uncomplicated ([place])   . Aortic valve stenosis 07/25/2011  . Hyperlipidemia 01/10/2011  . CAD (coronary artery disease) 01/10/2011  . Coronary arteriosclerosis 01/10/2011    Willow Ora, PTA, [facility] [address] [place], [place] [address] [phone] 11/22/18, 10:12 PM   Name: CHAITANYA AMEDEE MRN: [id number] Date of Birth: 04-02-1941

## 2018-11-23 ENCOUNTER — Ambulatory Visit (INDEPENDENT_AMBULATORY_CARE_PROVIDER_SITE_OTHER): Payer: Medicare Other | Admitting: Endocrinology

## 2018-11-23 ENCOUNTER — Encounter: Payer: Self-pay | Admitting: Endocrinology

## 2018-11-23 VITALS — BP 122/62 | HR 84 | Ht 71.0 in | Wt 236.6 lb

## 2018-11-23 DIAGNOSIS — E039 Hypothyroidism, unspecified: Secondary | ICD-10-CM | POA: Diagnosis not present

## 2018-11-23 DIAGNOSIS — E1165 Type 2 diabetes mellitus with hyperglycemia: Secondary | ICD-10-CM | POA: Diagnosis not present

## 2018-11-23 DIAGNOSIS — N183 Chronic kidney disease, stage 3 unspecified: Secondary | ICD-10-CM

## 2018-11-23 DIAGNOSIS — Z794 Long term (current) use of insulin: Secondary | ICD-10-CM

## 2018-11-23 LAB — POCT GLYCOSYLATED HEMOGLOBIN (HGB A1C): Hemoglobin A1C: 5.9 % — AB (ref 4.0–5.6)

## 2018-11-23 NOTE — Progress Notes (Signed)
Patient ID: Steven Maldonado, male   DOB: [birth year], 78 y.o.   MRN: [id number]          Reason for Appointment: Follow-up for Type 2 Diabetes  Referring PCP: Steven Maldonado   History of Present Illness:          Date of diagnosis of type 2 diabetes mellitus:  1999      Background history:   He had been treated with metformin and glipizide for several years Metformin was stopped about 4 years ago because of renal dysfunction Presumably because of poor control he was started on insulin around the year 2009 He had been mostly on Lantus which was subsequently changed to [facility] and he thinks that Lantus worked better Also has been on Humalog 3-4 times a day for some time  Recent history:   Most recent A1c is 11.6 in June 2019 and is pending from today  INSULIN regimen is:  TRESIBA 55 at bedtime, Humalog 5 units before meals 3 times a day  Non-insulin hypoglycemic drugs the patient is taking are: None  Current management, blood sugar patterns and problems identified:  On his last visit he was switched from [place] to [place] to keep his blood sugars more consistently controlled  However for the last 2 to 3 weeks he appears to be getting ischemia especially 11/14/18  He has not adjusted his [name] or call to report any low sugars as low as 12:39  He thinks that he is eating more amounts of carbohydrates overall specially at dinnertime  He has had only low sugars twice after dinner but not recently  Even with eating a fast food sandwich yesterday evening his blood sugar afterwards was only 114  Not clear if his renal function has changed  He has lost 6 pounds since his last visit  He thinks this is partly related to being more active with his getting his prosthesis now  He takes Humalog regardless of what he is eating or what his blood sugar is        Side effects from medications have been: None     Meal times are:  Breakfast is at 8 AM usually             Exercise:   Currently none  Glucose monitoring:  done recently 4.5 times a day         Glucometer: FreeStyle meter       Blood Glucose readings by time of day and averages from meter download:   PRE-MEAL Fasting Lunch Dinner  overnight Overall  Glucose range:  55-132   95-165  39, 43   Mean/median:  90   131   117   POST-MEAL PC Breakfast PC Lunch PC Dinner  Glucose range:   126-266  33-159  Mean/median:  74-171   90   PREVIOUS readings:   PRE-MEAL Fasting Lunch Dinner Bedtime Overall  Glucose range:  84-165  116-302  75-246    Mean/median:  130  155  175  150   POST-MEAL PC Breakfast PC Lunch PC Dinner  Glucose range:  ?   Mean/median:        Dietician visit, most recent: 9/19  Weight history:  Wt Readings from Last 3 Encounters:  11/23/18 236 lb 9.6 oz (107.3 kg)  09/03/18 240 lb 6.4 oz (109 kg)  09/02/18 240 lb (108.9 kg)    Glycemic control:   Lab Results  Component Value Date   HGBA1C 5.9 (A) 11/23/2018  HGBA1C 6.1 (A) 08/19/2018   HGBA1C 11.6 (H) 04/17/2018   Lab Results  Component Value Date   MICROALBUR 2.8 (H) 08/18/2018   LDLCALC 93 10/13/2017   CREATININE 1.93 (H) 08/18/2018   Lab Results  Component Value Date   MICRALBCREAT 2.2 08/18/2018    Lab Results  Component Value Date   FRUCTOSAMINE 268 06/26/2018    Office Visit on 11/23/2018  Component Date Value Ref Range Status  . Hemoglobin A1C 11/23/2018 5.9* 4.0 - 5.6 % Final    Allergies as of 11/23/2018   No Known Allergies     Medication List       Accurate as of November 23, 2018  1:46 PM. Always use your most recent med list.        amiodarone 200 MG tablet Commonly known as:  PACERONE Take 1 tablet (200 mg total) by mouth daily.   atorvastatin 80 MG tablet Commonly known as:  LIPITOR Take 1 tablet (80 mg total) by mouth at bedtime.   docusate sodium 100 MG capsule Commonly known as:  COLACE Take 100 mg by mouth 2 (two) times daily.   Fenofibric Acid 135 MG Cpdr TAKE 1  CAPSULE DAILY   FREESTYLE LITE Devi 1 each by Does not apply route 2 (two) times daily.   furosemide 20 MG tablet Commonly known as:  LASIX Take 1 tablet (20 mg total) by mouth daily.   gabapentin 300 MG capsule Commonly known as:  NEURONTIN Take 1 capsule (300 mg total) by mouth 3 (three) times daily.   glipiZIDE 10 MG 24 hr tablet Commonly known as:  GLUCOTROL XL TAKE 1 TABLET (10 MG TOTAL) BY MOUTH DAILY WITH BREAKFAST.   glucose blood test strip Commonly known as:  FREESTYLE LITE 1 each by Other route 4 (four) times daily -  before meals and at bedtime.   Insulin Degludec 200 UNIT/ML Sopn Commonly known as:  TRESIBA FLEXTOUCH Inject 66 Units into the skin at bedtime.   insulin lispro 100 UNIT/ML KwikPen Commonly known as:  HUMALOG KWIKPEN Inject 5 units in the skin three times daily with meals   Insulin Pen Needle 32G X 4 MM Misc Commonly known as:  BD PEN NEEDLE NANO U/F USE 4 (FOUR) TIMES DAILY.   levothyroxine 175 MCG tablet Commonly known as:  SYNTHROID, LEVOTHROID TAKE 1 TABLET BY MOUTH EVERY DAY   metoprolol tartrate 50 MG tablet Commonly known as:  LOPRESSOR TAKE ONE AND ONE-HALF TABLET BY MOUTH TWICE A DAY   mirabegron ER 25 MG Tb24 tablet Commonly known as:  MYRBETRIQ Take 1 tablet (25 mg total) by mouth daily.   pentoxifylline 400 MG CR tablet Commonly known as:  TRENTAL Take 1 tablet (400 mg total) by mouth 3 (three) times daily with meals.   PRADAXA 150 MG Caps capsule Generic drug:  dabigatran Take 150 mg by mouth every 12 (twelve) hours.       Allergies: No Known Allergies  Past Medical History:  Diagnosis Date  . Chronic diastolic CHF (congestive heart failure) ([place])   . CKD (chronic kidney disease), stage III ([place])   . Constipation   . Coronary artery disease    a. Cath February 2012 in [place] [facility], occluded RCA with collaterals  . DM type 2 (diabetes mellitus, type 2) ([place])   . Essential hypertension   . Hyperlipidemia   .  Neuropathy    feet  . Pacemaker    a. symptomatic brady after TAVR s/p MDT PPM by Dr.  Camnitz 12/04/17  . Persistent atrial fibrillation   . PONV (postoperative nausea and vomiting)    after valve surgery  . PVD (peripheral vascular disease) ([place])    a. s/p R popliteal artery stenosis tx with drug-coated balloon 05/2014, followed by Dr. Fletcher Anon.  . 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 Severe aortic stenosis    a. 12/02/17: s/p TAVR  . Skin cancer   . Sleep apnea with use of continuous positive airway pressure (CPAP)    04-11-11 AHI was 32.9 and titrated to 15 cm H20, DME is [facility]  . Subclinical hypothyroidism     Past Surgical History:  Procedure Laterality Date  . ABDOMINAL ANGIOGRAM N/A 06/08/2014   Procedure: ABDOMINAL ANGIOGRAM;  Surgeon: Wellington Hampshire, MD;  Location: [facility] CATH LAB;  Service: Cardiovascular;  Laterality: N/A;  . ABDOMINAL AORTOGRAM N/A 04/09/2018   Procedure: ABDOMINAL AORTOGRAM;  Surgeon: Conrad [place], MD;  Location: [place] CV LAB;  Service: Cardiovascular;  Laterality: N/A;  . AMPUTATION Left 04/17/2018   Procedure: LEFT FOOT 3RD RAY AMPUTATION;  Surgeon: Newt Minion, MD;  Location: [place];  Service: Orthopedics;  Laterality: Left;  . AMPUTATION Left 05/28/2018   Procedure: LEFT AMPUTATION BELOW KNEE;  Surgeon: Wylene Simmer, MD;  Location: [place];  Service: Orthopedics;  Laterality: Left;  . APPENDECTOMY  1965  . 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 CARDIAC CATHETERIZATION  12/2010  . CARDIOVERSION  07/2011  . CARDIOVERSION N/A 04/18/2014   Procedure: CARDIOVERSION;  Surgeon: Dorothy Spark, MD;  Location: [place];  Service: Cardiovascular;  Laterality: N/A;  . CARDIOVERSION N/A 11/03/2015   Procedure: CARDIOVERSION;  Surgeon: Lelon Perla, MD;  Location: [facility] ENDOSCOPY;  Service: Cardiovascular;  Laterality: N/A;  .  CARDIOVERSION N/A 05/08/2017   Procedure: CARDIOVERSION;  Surgeon: Dorothy Spark, MD;  Location: [facility] ENDOSCOPY;  Service: Cardiovascular;  Laterality: N/A;  . CARDIOVERSION N/A 07/28/2017   Procedure: CARDIOVERSION;  Surgeon: Dorothy Spark, MD;  Location: [place];  Service: Cardiovascular;  Laterality: N/A;  . LOWER EXTREMITY ANGIOGRAM N/A 06/08/2014   Procedure: LOWER EXTREMITY ANGIOGRAM;  Surgeon: Wellington Hampshire, MD;  Location: [facility] CATH LAB;  Service: Cardiovascular;  Laterality: N/A;  . LOWER EXTREMITY ANGIOGRAPHY Left 04/09/2018   Procedure: Lower Extremity Angiography;  Surgeon: Conrad [place], MD;  Location: [place] CV LAB;  Service: Cardiovascular;  Laterality: Left;  . PACEMAKER IMPLANT N/A 12/04/2017   Procedure: PACEMAKER IMPLANT;  Surgeon: Constance Haw, MD;  Location: [place] CV LAB;  Service: Cardiovascular;  Laterality: N/A;  . PERIPHERAL VASCULAR BALLOON ANGIOPLASTY Left 04/09/2018   Procedure: PERIPHERAL VASCULAR BALLOON ANGIOPLASTY;  Surgeon: Conrad [place], MD;  Location: [place] CV LAB;  Service: Cardiovascular;  Laterality: Left;  SFA  . POPLITEAL ARTERY ANGIOPLASTY Right 06/08/2014   Archie Endo 06/08/2014  . RIGHT/LEFT HEART CATH AND CORONARY ANGIOGRAPHY N/A 10/08/2017   Procedure: RIGHT/LEFT HEART CATH AND CORONARY ANGIOGRAPHY;  Surgeon: Burnell Blanks, MD;  Location: [place] CV LAB;  Service: Cardiovascular;  Laterality: N/A;  . SKIN CANCER EXCISION Bilateral    "have had them cut off back of neck X 2; off left upper arm; right wrist, near right shoulder blade" (06/08/2014)  . TEE WITHOUT CARDIOVERSION N/A 12/02/2017   Procedure: TRANSESOPHAGEAL ECHOCARDIOGRAM (TEE);  Surgeon: Burnell Blanks, MD;  Location: [place];  Service: Open Heart  Surgery;  Laterality: N/A;  . TEMPORARY PACEMAKER N/A 12/04/2017   Procedure: TEMPORARY PACEMAKER;  Surgeon: Leonie Man, MD;  Location: [place] CV LAB;  Service: Cardiovascular;  Laterality:  N/A;  . TRANSCATHETER AORTIC VALVE REPLACEMENT, TRANSFEMORAL N/A 12/02/2017   Procedure: TRANSCATHETER AORTIC VALVE REPLACEMENT, TRANSFEMORAL;  Surgeon: Burnell Blanks, MD;  Location: [place];  Service: Open Heart Surgery;  Laterality: N/A;  using Edwards Sapien 3 Transcatheter Heart Valve size 73mm    Family History  Problem Relation Age of Onset  . Diabetes Mother   . Heart attack Mother   . Hypertension Mother   . Heart attack Father   . Heart failure Father   . Hypertension Father   . Diabetes Father   . Diabetes Sister   . Diabetes Brother   . Diabetes Other   . Diabetes Daughter        TYPE ll  . Heart Problems Daughter   . Hypertension Sister   . Hypertension Brother   . Stroke Brother     Social History:  reports that he quit smoking about 46 years ago. His smoking use included cigarettes. He has a 28.00 pack-year smoking history. He has never used smokeless tobacco. He reports that he does not drink alcohol or use drugs.   Review of Systems   Lipid history: He is taking 80 mg atorvastatin with adequate control, high-dose with history of CAD    Lab Results  Component Value Date   CHOL 131 08/18/2018   HDL 30.30 (L) 08/18/2018   LDLCALC 93 10/13/2017   LDLDIRECT 77.0 08/18/2018   TRIG 219.0 (H) 08/18/2018   CHOLHDL 4 08/18/2018           Hypertension: Has been minimal with only antihypertensive being metoprolol, not on ACE inhibitor  BP Readings from Last 3 Encounters:  11/23/18 122/62  09/03/18 (!) 109/55  09/02/18 124/78    Most recent eye exam was in 12/18 ? 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 Hemoglobin A1C 11/23/2018 5.9* 4.0 - 5.6 % Final    Physical Examination:  BP 122/62 (BP Location: Left Arm, Patient Position: Sitting, Cuff Size: Normal)   Pulse 84   Ht 5\' 11"  (1.803 m)   Wt 236 lb 9.6 oz (107.3 kg)   SpO2 95%   BMI 33.00 kg/m          ASSESSMENT:  Diabetes type 2, insulin requiring with moderate obesity  See history of present illness for detailed discussion of current diabetes management, blood sugar patterns and problems identified  A1c is only 5.9  He continues to have very good control with recent  tendency to even hypoglycemia without changing his insulin doses He may also be getting more efficient insulin action by switching from [place] to [place] In the last week or so he has had significant overnight hypoglycemia but has not changed his insulin Still trying to use low carbohydrate meals and his weight has gone down since his last visit He is a little more active also May not need mealtime coverage with every meal especially dinnertime Also not clear if his renal function may be worse now  Currently since he is monitoring his blood sugars over 4 times a day he is eligible for the freestyle libre sensor  CKD: To follow-up with nephrologist soon  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: Last TSH was normal    PLAN:    Discussed need to avoid hypoglycemia  Also explained to him that his low sugars overnight are related to excessive basal insulin  He will reduce his  [name] down to 46 instead of 55 units  Given him a flowsheet to adjust her [name], discussed in detail how to monitor his fasting readings and adjust the dose by 2 units every 3 days for blood sugars below 90 or over 140  Unless he is eating carbohydrates he can skip the Humalog at dinnertime  He will contact the DME supplier for freestyle libre from the phone number list provided and given him patient information, discussed how this is helpful and how it is used  Continue improving diet and call if blood sugars are not consistently controlled  Recheck TSH on the next visit  There are no Patient Instructions on file for this visit.   Counseling time on subjects discussed in assessment and plan sections is over 50% of today's 25 minute visit   Elayne Snare 11/23/2018, 1:46 PM   Note: This office note was prepared with Dragon voice recognition system technology. Any transcriptional errors that result from this process are unintentional.

## 2018-11-24 ENCOUNTER — Ambulatory Visit: Payer: Medicare Other

## 2018-11-24 DIAGNOSIS — R2681 Unsteadiness on feet: Secondary | ICD-10-CM

## 2018-11-24 DIAGNOSIS — M6281 Muscle weakness (generalized): Secondary | ICD-10-CM | POA: Diagnosis not present

## 2018-11-24 DIAGNOSIS — R2689 Other abnormalities of gait and mobility: Secondary | ICD-10-CM

## 2018-11-24 DIAGNOSIS — M6249 Contracture of muscle, multiple sites: Secondary | ICD-10-CM | POA: Diagnosis not present

## 2018-11-24 DIAGNOSIS — Z9181 History of falling: Secondary | ICD-10-CM | POA: Diagnosis not present

## 2018-11-24 DIAGNOSIS — R293 Abnormal posture: Secondary | ICD-10-CM | POA: Diagnosis not present

## 2018-11-24 LAB — CUP PACEART REMOTE DEVICE CHECK
Battery Remaining Longevity: 126 mo
Battery Voltage: 3.01 V
Brady Statistic AP VP Percent: 69.09 %
Brady Statistic AP VS Percent: 0 %
Brady Statistic AS VP Percent: 30.83 %
Brady Statistic AS VS Percent: 0.07 %
Brady Statistic RA Percent Paced: 69.1 %
Brady Statistic RV Percent Paced: 99.92 %
Date Time Interrogation Session: 20191118183403
Implantable Lead Implant Date: 20190124
Implantable Lead Implant Date: 20190124
Implantable Lead Location: 753859
Implantable Lead Location: 753860
Implantable Lead Model: 5076
Implantable Lead Model: 5076
Implantable Pulse Generator Implant Date: 20190124
Lead Channel Impedance Value: 361 Ohm
Lead Channel Impedance Value: 380 Ohm
Lead Channel Impedance Value: 475 Ohm
Lead Channel Impedance Value: 532 Ohm
Lead Channel Pacing Threshold Amplitude: 0.75 V
Lead Channel Pacing Threshold Amplitude: 0.875 V
Lead Channel Pacing Threshold Pulse Width: 0.4 ms
Lead Channel Pacing Threshold Pulse Width: 0.4 ms
Lead Channel Sensing Intrinsic Amplitude: 2.375 mV
Lead Channel Sensing Intrinsic Amplitude: 2.375 mV
Lead Channel Sensing Intrinsic Amplitude: 24.875 mV
Lead Channel Sensing Intrinsic Amplitude: 7.75 mV
Lead Channel Setting Pacing Amplitude: 2 V
Lead Channel Setting Pacing Amplitude: 2.5 V
Lead Channel Setting Pacing Pulse Width: 0.4 ms
Lead Channel Setting Sensing Sensitivity: 4 mV

## 2018-11-25 NOTE — Therapy (Signed)
[facility] [address] [place], [name], [address] Phone: [phone]   Fax:  [phone]  Physical Therapy Treatment  Patient Details  Name: SHAHAB POLHAMUS MRN: [id number] Date of Birth: 05/24/1941 Referring Provider (PT): Mechele Claude, [place]   Encounter Date: 11/24/2018  PT End of Session - 11/25/18 0900    Visit Number  23    Number of Visits  26    Date for PT Re-Evaluation  12/02/17    Authorization Type  Medicare & Federal BCBS    Authorization Time Period  Medicare guidelines, Fed BCBS waves co-pay.  75 visit limit PT, OT & ST combined with 1 used.    Authorization - Visit Number  3    Authorization - Number of Visits  84    PT Start Time  6962    PT Stop Time  1400    PT Time Calculation (min)  45 min    Equipment Utilized During Treatment  Gait belt    Activity Tolerance  Patient tolerated treatment well    Behavior During Therapy  WFL for tasks assessed/performed       Past Medical History:  Diagnosis Date  . Chronic diastolic CHF (congestive heart failure) ([place])   . CKD (chronic kidney disease), stage III ([place])   . Constipation   . Coronary artery disease    a. Cath February 2012 in [facility], occluded RCA with collaterals  . DM type 2 (diabetes mellitus, type 2) ([place])   . Essential hypertension   . Hyperlipidemia   . Neuropathy    feet  . Pacemaker    a. symptomatic brady after TAVR s/p MDT PPM by Dr. Curt Bears 12/04/17  . Persistent atrial fibrillation   . PONV (postoperative nausea and vomiting)    after valve surgery  . PVD (peripheral vascular disease) ([place])    a. s/p R popliteal artery stenosis tx with drug-coated balloon 05/2014, followed by Dr. Fletcher Anon.  . 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 Severe aortic stenosis    a. 12/02/17: s/p TAVR  . Skin cancer   .  Sleep apnea with use of continuous positive airway pressure (CPAP)    04-11-11 AHI was 32.9 and titrated to 15 cm H20, DME is [facility]  . Subclinical hypothyroidism     Past Surgical History:  Procedure Laterality Date  . ABDOMINAL ANGIOGRAM N/A 06/08/2014   Procedure: ABDOMINAL ANGIOGRAM;  Surgeon: Wellington Hampshire, MD;  Location: [facility] CATH LAB;  Service: Cardiovascular;  Laterality: N/A;  . ABDOMINAL AORTOGRAM N/A 04/09/2018   Procedure: ABDOMINAL AORTOGRAM;  Surgeon: Conrad Belleville, MD;  Location: [facility] CV LAB;  Service: Cardiovascular;  Laterality: N/A;  . AMPUTATION Left 04/17/2018   Procedure: LEFT FOOT 3RD RAY AMPUTATION;  Surgeon: Newt Minion, MD;  Location: [facility];  Service: Orthopedics;  Laterality: Left;  . AMPUTATION Left 05/28/2018   Procedure: LEFT AMPUTATION BELOW KNEE;  Surgeon: Wylene Simmer, MD;  Location: [facility];  Service: Orthopedics;  Laterality: Left;  . APPENDECTOMY  1965  . 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 CARDIAC CATHETERIZATION  12/2010  . CARDIOVERSION  07/2011  . CARDIOVERSION N/A 04/18/2014   Procedure: CARDIOVERSION;  Surgeon: Dorothy Spark, MD;  Location: [facility] ENDOSCOPY;  Service: Cardiovascular;  Laterality: N/A;  . CARDIOVERSION N/A 11/03/2015   Procedure: CARDIOVERSION;  Surgeon: Lelon Perla,  MD;  Location: [facility];  Service: Cardiovascular;  Laterality: N/A;  . CARDIOVERSION N/A 05/08/2017   Procedure: CARDIOVERSION;  Surgeon: Dorothy Spark, MD;  Location: [facility] ENDOSCOPY;  Service: Cardiovascular;  Laterality: N/A;  . CARDIOVERSION N/A 07/28/2017   Procedure: CARDIOVERSION;  Surgeon: Dorothy Spark, MD;  Location: [facility];  Service: Cardiovascular;  Laterality: N/A;  . LOWER EXTREMITY ANGIOGRAM N/A 06/08/2014   Procedure: LOWER EXTREMITY ANGIOGRAM;  Surgeon: Wellington Hampshire, MD;  Location: [facility] CATH LAB;  Service: Cardiovascular;  Laterality: N/A;  . LOWER EXTREMITY ANGIOGRAPHY Left 04/09/2018   Procedure: Lower Extremity  Angiography;  Surgeon: Conrad Mount Carmel, MD;  Location: [facility] CV LAB;  Service: Cardiovascular;  Laterality: Left;  . PACEMAKER IMPLANT N/A 12/04/2017   Procedure: PACEMAKER IMPLANT;  Surgeon: Constance Haw, MD;  Location: [facility] CV LAB;  Service: Cardiovascular;  Laterality: N/A;  . PERIPHERAL VASCULAR BALLOON ANGIOPLASTY Left 04/09/2018   Procedure: PERIPHERAL VASCULAR BALLOON ANGIOPLASTY;  Surgeon: Conrad Edison, MD;  Location: [facility] CV LAB;  Service: Cardiovascular;  Laterality: Left;  SFA  . POPLITEAL ARTERY ANGIOPLASTY Right 06/08/2014   Archie Endo 06/08/2014  . RIGHT/LEFT HEART CATH AND CORONARY ANGIOGRAPHY N/A 10/08/2017   Procedure: RIGHT/LEFT HEART CATH AND CORONARY ANGIOGRAPHY;  Surgeon: Burnell Blanks, MD;  Location: [facility] CV LAB;  Service: Cardiovascular;  Laterality: N/A;  . SKIN CANCER EXCISION Bilateral    "have had them cut off back of neck X 2; off left upper arm; right wrist, near right shoulder blade" (06/08/2014)  . TEE WITHOUT CARDIOVERSION N/A 12/02/2017   Procedure: TRANSESOPHAGEAL ECHOCARDIOGRAM (TEE);  Surgeon: Burnell Blanks, MD;  Location: [facility];  Service: Open Heart Surgery;  Laterality: N/A;  . TEMPORARY PACEMAKER N/A 12/04/2017   Procedure: TEMPORARY PACEMAKER;  Surgeon: Leonie Man, MD;  Location: [facility] CV LAB;  Service: Cardiovascular;  Laterality: N/A;  . TRANSCATHETER AORTIC VALVE REPLACEMENT, TRANSFEMORAL N/A 12/02/2017   Procedure: TRANSCATHETER AORTIC VALVE REPLACEMENT, TRANSFEMORAL;  Surgeon: Burnell Blanks, MD;  Location: [facility];  Service: Open Heart Surgery;  Laterality: N/A;  using Edwards Sapien 3 Transcatheter Heart Valve size 29m    There were no vitals filed for this visit.     [facility]Exercise - 11/25/18 0001      Ambulation/Gait   Ambulation/Gait  Yes    Ambulation/Gait Assistance  5: Supervision;4: Min guard    Ambulation/Gait Assistance Details  Pt C/O pain with weight  bearing during ambulation with quad tipped cane while requring one seated rest break, pt able to ambulate outdoors for 500' with no LOB.     Ambulation Distance (Feet)  500 Feet   115   Assistive device  Rolling walker;Straight cane;Prosthesis   with rubber quad tip   Gait Pattern  Step-through pattern;Decreased stride length;Decreased trunk rotation;Trunk flexed    Ambulation Surface  Level;Indoor;Unlevel;Outdoor;Paved    Ramp  5: Supervision    Ramp Details (indicate cue type and reason)  with RW/prosthesis    Curb  5: Supervision    Curb Details (indicate cue type and reason)  with RW/prosthesis        PT Short Term Goals - 11/03/18 2209      PT SHORT TERM GOAL #1   Title  Patient verbalizes adjusting ply socks with limb volume changes. (All Updated STGs Target Date: 10/30/2018)    Baseline  12/20: Pt able to adjust ply sock with limb volume change.     Time  1    Period  Months    Status  Achieved      PT SHORT TERM GOAL #2   Title  Patient tolerates prosthesis wear >12hrs total /day without skin issues.     Baseline  MET 11/02/2018    Time  1    Period  Months    Status  Achieved      PT SHORT TERM GOAL #3   Title  Patient reaches 5" & to knee level without UE support with supervision.     Baseline  12/20:  pt able to reach 5" & to knee level with no UE support, min guard.     Time  1    Period  Months    Status  Partially Met      PT SHORT TERM GOAL #4   Title  Patient ambulates 400' with RW & prosthesis with supervision.     Baseline  MET 11/02/2018    Time  1    Period  Months    Status  Achieved      PT SHORT TERM GOAL #5   Title  Patient ambulates 100' with cane & prosthesis with minA.     Baseline  NOT MET 11/02/2018  Patient has residual limb pain with increased weight bearing with cane.     Time  1    Period  Months    Status  Not Met        PT Long Term Goals - 11/24/18 1332      PT LONG TERM GOAL #1   Title  Patient verbalizes & demonstrates  proper prosthetic care including donning / doffing independently to enable safe use of prosthesis (All LTGs Target Date: 11/27/2018)    Baseline  1/14: Pt verbalizes/demo proper prosthetic care, doffing independent, donning supervision.     Time  3    Period  Months    Status  Partially Met      PT LONG TERM GOAL #2   Title  Patient tolerates prosthesis wear daily for >90% of awake hours without skin or limb pain issues to enable function throughout his day.     Baseline  1/14: Pt tolerates >90% prosthesis wear daily without skin issues or limb pain.     Time  3    Period  Months    Status  Achieved      PT LONG TERM GOAL #3   Title  Berg Balance >36/56 to indicate lower fall risk & less dependency in standing ADLs.     Time  3    Period  Months    Status  New      PT LONG TERM GOAL #4   Title  Patient ambulates 500' with RW (or rollator) & prosthesis outdoors on paved or grass up to 50' surfaces modified independent for community mobility.     Baseline  1/14: Pt ambulates 500' with RW/prosthesis outdoors on pavement with Supervision level for assistance.     Time  3    Period  Months    Status  Partially Met      PT LONG TERM GOAL #5   Title  Patient negotiates ramps, curbs with walker & stairs 1 rail /cane modified independent for community access.     Time  3    Period  Months    Status  New            Plan - 11/25/18 0901    Clinical Impression Statement  Todays skilled session focused on gait training with RW outdoors for 500' with no LOB, Min guard level, indoors with rubber quad tipped cane with min guard/assist due to pain with weight bearing, and negotiating ramps and curbs. Pt should benefit from continued PT sessions to progress towards goals.     Rehab Potential  Good    PT Frequency  2x / week    PT Duration  Other (comment)    PT Treatment/Interventions  ADLs/Self Care Home Management;Canalith Repostioning;DME Instruction;Gait training;Stair  training;Functional mobility training;Therapeutic activities;Therapeutic exercise;Balance training;Neuromuscular re-education;Patient/family education;Prosthetic Training;Manual techniques;Vestibular    PT Next Visit Plan  check remaining LTGs, check residual limb pain, see how new socket is doing with mobility    Consulted and Agree with Plan of Care  Patient       Patient will benefit from skilled therapeutic intervention in order to improve the following deficits and impairments:  Abnormal gait, Decreased activity tolerance, Decreased balance, Decreased endurance, Decreased knowledge of use of DME, Decreased mobility, Impaired flexibility, Decreased range of motion, Decreased strength, Decreased skin integrity, Dizziness, Postural dysfunction, Prosthetic Dependency  Visit Diagnosis: Muscle weakness (generalized)  Other abnormalities of gait and mobility  Unsteadiness on feet     Problem List Patient Active Problem List   Diagnosis Date Noted  . Controlled type 2 diabetes mellitus without complication ([place]) 16/08/9603  . Bradycardia 09/01/2018  . Gangrene of left foot ([place]) 05/28/2018  . 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 Obstructive sleep apnea syndrome 05/04/2018  . Obstructive sleep apnea treated with continuous positive airway pressure (CPAP) 05/04/2018  . Type II diabetes mellitus, uncontrolled ([place]) 04/22/2018  . Atherosclerosis of native arteries of the extremities with gangrene ([place]) 04/09/2018  . Atherosclerosis of native artery of extremity ([place]) 04/09/2018  . Cellulitis in diabetic foot ([place]) 04/04/2018  . Paroxysmal atrial fibrillation ([place]) 12/05/2017  . Cardiac pacemaker in situ   . Symptomatic bradycardia   . Asystole ([place])   . 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 History of aortic valve replacement 12/02/2017  . Severe aortic stenosis   . Bilateral impacted cerumen 01/22/2016  . Essential  hypertension 11/14/2015  . Peripheral vascular disease ([place]) 05/24/2014  . Chronic kidney disease 08/30/2013  . Hypothyroidism 08/30/2013  . 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 Diabetes mellitus type 2, uncomplicated ([place])   . Aortic valve stenosis 07/25/2011  . Hyperlipidemia 01/10/2011  . CAD (coronary artery disease) 01/10/2011  . Coronary arteriosclerosis 01/10/2011   Chassity Felts, PTA  Chassity A Felts 11/25/2018, 9:07 AM  [facility] [address] [place], [name], [address] Phone: [phone]   Fax:  [phone]  Name: MADDEN GARRON MRN: [id number] Date of Birth: 06-15-1941

## 2018-11-27 ENCOUNTER — Encounter: Payer: Self-pay | Admitting: Physical Therapy

## 2018-11-27 ENCOUNTER — Ambulatory Visit: Payer: Medicare Other | Admitting: Physical Therapy

## 2018-11-27 DIAGNOSIS — I350 Nonrheumatic aortic (valve) stenosis: Secondary | ICD-10-CM | POA: Diagnosis not present

## 2018-11-27 DIAGNOSIS — N183 Chronic kidney disease, stage 3 (moderate): Secondary | ICD-10-CM | POA: Diagnosis not present

## 2018-11-27 DIAGNOSIS — R2681 Unsteadiness on feet: Secondary | ICD-10-CM | POA: Diagnosis not present

## 2018-11-27 DIAGNOSIS — M6281 Muscle weakness (generalized): Secondary | ICD-10-CM | POA: Diagnosis not present

## 2018-11-27 DIAGNOSIS — R82998 Other abnormal findings in urine: Secondary | ICD-10-CM | POA: Diagnosis not present

## 2018-11-27 DIAGNOSIS — I129 Hypertensive chronic kidney disease with stage 1 through stage 4 chronic kidney disease, or unspecified chronic kidney disease: Secondary | ICD-10-CM | POA: Diagnosis not present

## 2018-11-27 DIAGNOSIS — Z9181 History of falling: Secondary | ICD-10-CM | POA: Diagnosis not present

## 2018-11-27 DIAGNOSIS — R2689 Other abnormalities of gait and mobility: Secondary | ICD-10-CM

## 2018-11-27 DIAGNOSIS — M6249 Contracture of muscle, multiple sites: Secondary | ICD-10-CM | POA: Diagnosis not present

## 2018-11-27 DIAGNOSIS — R293 Abnormal posture: Secondary | ICD-10-CM | POA: Diagnosis not present

## 2018-11-27 DIAGNOSIS — I5032 Chronic diastolic (congestive) heart failure: Secondary | ICD-10-CM | POA: Diagnosis not present

## 2018-11-27 DIAGNOSIS — E1122 Type 2 diabetes mellitus with diabetic chronic kidney disease: Secondary | ICD-10-CM | POA: Diagnosis not present

## 2018-11-27 DIAGNOSIS — I2510 Atherosclerotic heart disease of native coronary artery without angina pectoris: Secondary | ICD-10-CM | POA: Diagnosis not present

## 2018-11-28 ENCOUNTER — Other Ambulatory Visit: Payer: Self-pay | Admitting: *Deleted

## 2018-11-28 MED ORDER — FUROSEMIDE 20 MG PO TABS: 20.0000 mg | ORAL_TABLET | Freq: Every day | ORAL | 3 refills | Status: DC

## 2018-11-29 NOTE — Progress Notes (Signed)
HE[facility] AND [place]                                       Cardiology Office Note    Date:  12/02/2018   ID:  Kano, Heckmann 06-02-1941, MRN [id number]  PCP:  Susy Frizzle, MD  Cardiologist: Dr. Angelena Form / Dr. Angelena Form &Dr. Roxy Manns (TAVR)  CC: 1 year s/p TAVR  History of Present Illness:  Steven Maldonado is a 78 y.o. 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  He underwentsuccessful TAVR with a 29 mm Edwards Sapien THV via the R TF approachon 12/02/17.Post operative echoshowedEF 55-60% with normally functioning TAVR valve, no PVL and mean gradient 62mm Hg. He was discharged on ASA and home pradaxa.He developed hemodynamically significant bradycardiaand syncope post operativelyand required PPM placement. Dr. Curt Bears implanted aMedtronic Azure XT DR MRI SureScan dual-chamber pacemaker(serial number [id number])on 12/04/17.He was in and out of atrial fib/flutter during his admission. 1 month echo showed normally functioning TAVR with no PVL and mean gradient 10 mm Hg.  In 05/2018 he developed gangrene and underwent L BKA.  Today he presents to clinic for follow up. He has been getting around okay with a walker. No CP or SOB. No LE edema, orthopnea or PND. No dizziness or syncope. No blood in stool or urine. No palpitations. He has lost 25 lbs through diet since his amputation. He has got his hga1c under much better control. He is feeling great.     Past Medical History:  Diagnosis Date  . Chronic diastolic CHF (congestive heart failure) ([place])   . CKD (chronic kidney disease), stage III ([place])   . Constipation   . Coronary artery disease    a. Cath February 2012 in [place]  Fear, occluded RCA with collaterals  . DM type 2 (diabetes mellitus, type 2) ([place])   . Essential hypertension   . Hyperlipidemia   . Neuropathy    feet  . Pacemaker    a. symptomatic brady after TAVR s/p MDT PPM by Dr. Curt Bears 12/04/17  . Persistent atrial fibrillation   . PONV (postoperative nausea and vomiting)    after valve surgery  . PVD (peripheral vascular disease) ([place])    a. s/p R popliteal artery stenosis tx with drug-coated balloon 05/2014, followed by Dr. Fletcher Anon.  . 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 Severe aortic stenosis    a. 12/02/17: s/p TAVR  . Skin cancer   . Sleep apnea with use of continuous positive airway pressure (CPAP)    04-11-11 AHI was 32.9 and titrated to 15 cm H20, DME is [facility]  . Subclinical hypothyroidism     Past Surgical History:  Procedure Laterality Date  . ABDOMINAL ANGIOGRAM N/A 06/08/2014   Procedure: ABDOMINAL ANGIOGRAM;  Surgeon: Wellington Hampshire, MD;  Location: [facility] CATH LAB;  Service: Cardiovascular;  Laterality: N/A;  . ABDOMINAL AORTOGRAM N/A 04/09/2018   Procedure: ABDOMINAL AORTOGRAM;  Surgeon: Conrad [place], MD;  Location: [place] CV LAB;  Service: Cardiovascular;  Laterality: N/A;  . AMPUTATION Left 04/17/2018   Procedure: LEFT FOOT 3RD RAY AMPUTATION;  Surgeon: Newt Minion, MD;  Location: [place];  Service: Orthopedics;  Laterality: Left;  . AMPUTATION Left 05/28/2018   Procedure: LEFT AMPUTATION BELOW KNEE;  Surgeon: Wylene Simmer, MD;  Location: [place];  Service: Orthopedics;  Laterality: Left;  . APPENDECTOMY  1965  . 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 CARDIAC CATHETERIZATION  12/2010  . CARDIOVERSION  07/2011  . CARDIOVERSION N/A 04/18/2014   Procedure: CARDIOVERSION;  Surgeon: Dorothy Spark, MD;  Location: [place];  Service: Cardiovascular;  Laterality: N/A;  . CARDIOVERSION N/A 11/03/2015     Procedure: CARDIOVERSION;  Surgeon: Lelon Perla, MD;  Location: [facility] ENDOSCOPY;  Service: Cardiovascular;  Laterality: N/A;  . CARDIOVERSION N/A 05/08/2017   Procedure: CARDIOVERSION;  Surgeon: Dorothy Spark, MD;  Location: [facility] ENDOSCOPY;  Service: Cardiovascular;  Laterality: N/A;  . CARDIOVERSION N/A 07/28/2017   Procedure: CARDIOVERSION;  Surgeon: Dorothy Spark, MD;  Location: [place];  Service: Cardiovascular;  Laterality: N/A;  . LOWER EXTREMITY ANGIOGRAM N/A 06/08/2014   Procedure: LOWER EXTREMITY ANGIOGRAM;  Surgeon: Wellington Hampshire, MD;  Location: [facility] CATH LAB;  Service: Cardiovascular;  Laterality: N/A;  . LOWER EXTREMITY ANGIOGRAPHY Left 04/09/2018   Procedure: Lower Extremity Angiography;  Surgeon: Conrad Stratford, MD;  Location: [place] CV LAB;  Service: Cardiovascular;  Laterality: Left;  . PACEMAKER IMPLANT N/A 12/04/2017   Procedure: PACEMAKER IMPLANT;  Surgeon: Constance Haw, MD;  Location: [place] CV LAB;  Service: Cardiovascular;  Laterality: N/A;  . PERIPHERAL VASCULAR BALLOON ANGIOPLASTY Left 04/09/2018   Procedure: PERIPHERAL VASCULAR BALLOON ANGIOPLASTY;  Surgeon: Conrad Berlin, MD;  Location: [place] CV LAB;  Service: Cardiovascular;  Laterality: Left;  SFA  . POPLITEAL ARTERY ANGIOPLASTY Right 06/08/2014   Archie Endo 06/08/2014  . RIGHT/LEFT HEART CATH AND CORONARY ANGIOGRAPHY N/A 10/08/2017   Procedure: RIGHT/LEFT HEART CATH AND CORONARY ANGIOGRAPHY;  Surgeon: Burnell Blanks, MD;  Location: [place] CV LAB;  Service: Cardiovascular;  Laterality: N/A;  . SKIN CANCER EXCISION Bilateral    "have had them cut off back of neck X 2; off left upper arm; right wrist, near right shoulder blade" (06/08/2014)  . TEE WITHOUT CARDIOVERSION N/A 12/02/2017   Procedure: TRANSESOPHAGEAL ECHOCARDIOGRAM (TEE);  Surgeon: Burnell Blanks, MD;  Location: [place];  Service: Open Heart Surgery;  Laterality: N/A;  . TEMPORARY PACEMAKER N/A 12/04/2017    Procedure: TEMPORARY PACEMAKER;  Surgeon: Leonie Man, MD;  Location: [place] CV LAB;  Service: Cardiovascular;  Laterality: N/A;  . TRANSCATHETER AORTIC VALVE REPLACEMENT, TRANSFEMORAL N/A 12/02/2017   Procedure: TRANSCATHETER AORTIC VALVE REPLACEMENT, TRANSFEMORAL;  Surgeon: Burnell Blanks, MD;  Location: [place];  Service: Open Heart Surgery;  Laterality: N/A;  using Edwards Sapien 3 Transcatheter Heart Valve size 38mm    Current Medications: Outpatient Medications Prior to Visit  Medication Sig Dispense Refill  . amiodarone (PACERONE) 200 MG tablet Take 1 tablet (200 mg total) by mouth daily. 90 tablet 3  . atorvastatin (LIPITOR) 80 MG tablet Take 1 tablet (80 mg total) by mouth at bedtime. 90 tablet 3  . Blood Glucose Monitoring Suppl (FREESTYLE LITE) DEVI 1 each by Does not apply route 2 (two) times daily. 1 each 0  . Choline Fenofibrate (FENOFIBRIC ACID) 135 MG CPDR TAKE 1 CAPSULE DAILY 90 capsule 3  . dabigatran (PRADAXA) 150 MG CAPS capsule Take 150 mg  by mouth every 12 (twelve) hours.     . docusate sodium (COLACE) 100 MG capsule Take 100 mg by mouth 2 (two) times daily.    . furosemide (LASIX) 20 MG tablet Take 1 tablet (20 mg total) by mouth daily. (Patient taking differently: Take 20 mg by mouth every Monday, Wednesday, and Friday. ) 90 tablet 3  . gabapentin (NEURONTIN) 300 MG capsule Take 1 capsule (300 mg total) by mouth 3 (three) times daily. 90 capsule 5  . glipiZIDE (GLUCOTROL XL) 10 MG 24 hr tablet TAKE 1 TABLET (10 MG TOTAL) BY MOUTH DAILY WITH BREAKFAST. 90 tablet 1  . glucose blood (FREESTYLE LITE) test strip 1 each by Other route 4 (four) times daily -  before meals and at bedtime. 450 each 3  . Insulin Degludec (TRESIBA FLEXTOUCH) 200 UNIT/ML SOPN Inject 66 Units into the skin at bedtime. (Patient taking differently: Inject 46 Units into the skin at bedtime. ) 5 pen 2  . insulin lispro (HUMALOG KWIKPEN) 100 UNIT/ML KwikPen Inject 5 units in the skin three  times daily with meals (Patient taking differently: Inject 5 Units into the skin 2 (two) times daily. Inject 5 units in the skin twice a day with breakfast and lunch.) 15 pen 2  . Insulin Pen Needle (BD PEN NEEDLE NANO U/F) 32G X 4 MM MISC USE 4 (FOUR) TIMES DAILY. 1000 each 0  . levothyroxine (SYNTHROID, LEVOTHROID) 175 MCG tablet TAKE 1 TABLET BY MOUTH EVERY DAY 90 tablet 1  . metoprolol tartrate (LOPRESSOR) 50 MG tablet TAKE ONE AND ONE-HALF TABLET BY MOUTH TWICE A DAY 270 tablet 2  . mirabegron ER (MYRBETRIQ) 25 MG TB24 tablet Take 1 tablet (25 mg total) by mouth daily. 90 tablet 3  . pentoxifylline (TRENTAL) 400 MG CR tablet Take 1 tablet (400 mg total) by mouth 3 (three) times daily with meals. 90 tablet 3   No facility-administered medications prior to visit.      Allergies:   Patient has no known allergies.   Social History   Socioeconomic History  . Marital status: Married    Spouse name: [name]  . Number of children: 1  . Years of education: 76  . Highest education level: Not on file  Occupational History  . Occupation: retired    Fish farm manager: [employer]  . Financial resource strain: Not on file  . Food insecurity:    Worry: Not on file    Inability: Not on file  . Transportation needs:    Medical: Not on file    Non-medical: Not on file  Tobacco Use  . Smoking status: Former Smoker    Packs/day: 2.00    Years: 14.00    Pack years: 28.00    Types: Cigarettes    Last attempt to quit: 11/11/1972    Years since quitting: 46.0  . Smokeless tobacco: Never Used  Substance and Sexual Activity  . Alcohol use: No    Comment: quit in 1984  . Drug use: No  . Sexual activity: Not Currently  Lifestyle  . Physical activity:    Days per week: 0 days    Minutes per session: 0 min  . Stress: Not at all  Relationships  . Social connections:    Talks on phone: Not on file    Gets together: Not on file    Attends religious service: Not on file    Active  member of club or organization: Not on file    Attends  meetings of clubs or organizations: Not on file    Relationship status: Not on file  Other Topics Concern  . 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   Patient has one child and his wife has one child.   Patient is retired.   Patient has a high school education.   Patient is right-handed.   Patient drinks very little caffeine.     Family History:  The patient's family history includes Diabetes in his brother, daughter, father, mother, sister, and another family member; Heart Problems in his daughter; Heart attack in his father and mother; Heart failure in his father; Hypertension in his brother, father, mother, and sister; Stroke in his brother.      ROS:   Please see the history of present illness.    ROS All other systems reviewed and are negative.   PHYSICAL EXAM:   VS:  BP 132/64   Pulse 81   Ht 5\' 11"  (1.803 m)   Wt 247 lb 9.6 oz (112.3 kg)   SpO2 97%   BMI 34.53 kg/m    GEN: Well nourished, well developed, in no acute distress HEENT: normal Neck: no JVD or masses Cardiac: RRR; 1/6 SEM. No rubs, or gallops,no edema. S/p L BKA Respiratory:  clear to auscultation bilaterally, normal work of breathing GI: soft, nontender, nondistended, + BS MS: no deformity or atrophy Skin: warm and dry, no rash Neuro:  Alert and Oriented x 3, Strength and sensation are intact Psych: euthymic mood, full affect     Wt Readings from Last 3 Encounters:  12/02/18 247 lb 9.6 oz (112.3 kg)  11/23/18 236 lb 9.6 oz (107.3 kg)  09/03/18 240 lb 6.4 oz (109 kg)      Studies/Labs Reviewed:   EKG:  EKG is NOT ordered today.    Recent Labs: 12/03/2017: Magnesium 1.9 12/11/2017: NT-Pro BNP 271 05/31/2018: Hemoglobin 9.6; Platelets 271 08/18/2018: ALT 35; BUN 38; Creatinine, Ser 1.93; Potassium 4.4; Sodium 139; TSH 2.48   Lipid Panel    Component Value Date/Time   CHOL 131  08/18/2018 1500   TRIG 219.0 (H) 08/18/2018 1500   HDL 30.30 (L) 08/18/2018 1500   CHOLHDL 4 08/18/2018 1500   VLDL 43.8 (H) 08/18/2018 1500   LDLCALC 93 10/13/2017 1224   LDLDIRECT 77.0 08/18/2018 1500    Additional studies/ records that were reviewed today include:  TAVR OPERATIVE NOTE   Date of Procedure:12/02/2017 Procedure:   Transcatheter Aortic Valve Replacement - PercutaneousRightTransfemoral Approach Edwards Sapien 3 THV (size 6mm, model # 9600TFX, serial # [id number])  Co-Surgeons:Christopher Angelena Form, MD andClarence H. Roxy Manns, MD   Pre-operative Echo Findings: ? Severe aortic stenosis ? Normalleft ventricular systolic function  Post-operative Echo Findings: ? Noparavalvular leak ? Normalleft ventricular systolic function   _________________  2D ECHO 12/24/17 (1 month s/p TAVR) Study Conclusions - Left ventricle: Inferobasal hypokinesis The cavity size was normal. Wall thickness was increased in a pattern of severe LVH. Systolic function was normal. The estimated ejection fraction was in the range of 55% to 60%. - Aortic valve: Post TAVR with no significant peri valvualr regurgitation gradients have increased since 12/03/17. - Mitral valve: Severely calcified annulus. Moderately thickened, moderately calcified leaflets . There was mild regurgitation. - Left atrium: The atrium was moderately dilated. - Atrial septum: No defect or patent foramen ovale was identified   _________________    Echo 12/02/18 Study Conclusions  - Left ventricle: The  cavity size was normal. There was moderate   concentric hypertrophy. Systolic function was normal. The   estimated ejection fraction was in the range of 60% to 65%. Wall   motion was normal; there were no regional wall motion   abnormalities. Doppler parameters are consistent with abnormal   left ventricular relaxation (grade 1 diastolic  dysfunction).   Doppler parameters are consistent with elevated ventricular   end-diastolic filling pressure. - Aortic valve: S/P TAVR with a 29 mm Edwards-SAPIEN 3 valve. Mean   gradient (S): 12 mm Hg. Peak gradient (S): 25 mm Hg. Valve area   (VTI): 1.6 cm^2. Valve area (Vmax): 1.26 cm^2. Valve area   (Vmean): 1.42 cm^2. - Mitral valve: There was mild regurgitation. - Left atrium: The atrium was mildly dilated. - Right ventricle: Systolic function was normal. - Tricuspid valve: There was no regurgitation. Impressions: - Since the last study on 12/24/2017 transaortic gradients are   stable and normal, there is no paravalvular leak.   ASSESSMENT & PLAN:   Severe AS s/p TAVR: echo today shows normally functioning TAVR with mean gradient of 44mm Hg and no PVL. He has NYHA class I symptoms, but mobility limited by recent amputation with prosthesis. SBE prophylaxis discussed; the patient is edentulous and does not go to the dentist. He will continue on pradaxa alone for his atrial fibrillation. He will see Dr. Angelena Form back in the office in about 6 months time.     Medication Adjustments/Labs and Tests Ordered: Current medicines are reviewed at length with the patient today.  Concerns regarding medicines are outlined above.  Medication changes, Labs and Tests ordered today are listed in the Patient Instructions below. Patient Instructions  Medication Instructions:  Your physician recommends that you continue on your current medications as directed. Please refer to the Current Medication list given to you today.  If you need a refill on your cardiac medications before your next appointment, please call your pharmacy.   Lab work: None Ordered  If you have labs (blood work) drawn today and your tests are completely normal, you will receive your results only by: [name] Kitchen MyChart Message (if you have MyChart) OR . A paper copy in the mail If you have any lab test that is abnormal or we need to  change your treatment, we will call you to review the results.  Testing/Procedures: None ordered  Follow-Up: At [facility], you and your health needs are our priority.  As part of our continuing mission to provide you with exceptional heart care, we have created designated Provider Care Teams.  These Care Teams include your primary Cardiologist (physician) and Advanced Practice Providers (APPs -  Physician Assistants and Nurse Practitioners) who all work together to provide you with the care you need, when you need it. . You will need a follow up appointment in 6 months.  Please call our office 2 months in advance to schedule this appointment.  You may see Darlina Guys MD or one of the following Advanced Practice Providers on your designated Care Team:   . Lyda Jester, [name] . Dayna Dunn, [name] . Ermalinda Barrios, [name]  Any Other Special Instructions Will Be Listed Below (If Applicable).       Signed, Angelena Form, [name]  12/02/2018 4:41 PM    [facility] [address], [place], [place]  [address] Phone: [phone]; Fax: [phone]

## 2018-11-29 NOTE — Therapy (Signed)
[facility] [address] [place], [name], [address] Phone: [phone]   Fax:  [phone]  Physical Therapy Treatment  Patient Details  Name: Steven Maldonado MRN: [id number] Date of Birth: 12-17-1940 Referring Provider (PT): Mechele Claude, [place]   Encounter Date: 11/27/2018   11/27/18 1323  PT Visits / Re-Eval  Visit Number 24  Number of Visits 26  Date for PT Re-Evaluation 12/02/17  Authorization  Authorization Type Medicare & Federal BCBS  Authorization Time Period Medicare guidelines, Fed BCBS waves co-pay.  75 visit limit PT, OT & ST combined with 1 used.  Authorization - Visit Number 4  Authorization - Number of Visits 75  PT Time Calculation  PT Start Time 1319  PT Stop Time 1400  PT Time Calculation (min) 41 min  PT - End of Session  Equipment Utilized During Treatment Gait belt  Activity Tolerance Patient tolerated treatment well  Behavior During Therapy WFL for tasks assessed/performed     Past Medical History:  Diagnosis Date  . Chronic diastolic CHF (congestive heart failure) ([place])   . CKD (chronic kidney disease), stage III ([place])   . Constipation   . Coronary artery disease    a. Cath February 2012 in [facility], occluded RCA with collaterals  . DM type 2 (diabetes mellitus, type 2) ([place])   . Essential hypertension   . Hyperlipidemia   . Neuropathy    feet  . Pacemaker    a. symptomatic brady after TAVR s/p MDT PPM by Dr. Curt Bears 12/04/17  . Persistent atrial fibrillation   . PONV (postoperative nausea and vomiting)    after valve surgery  . PVD (peripheral vascular disease) ([place])    a. s/p R popliteal artery stenosis tx with drug-coated balloon 05/2014, followed by Dr. Fletcher Anon.  . 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 Severe aortic stenosis    a. 12/02/17: s/p TAVR  .  Skin cancer   . Sleep apnea with use of continuous positive airway pressure (CPAP)    04-11-11 AHI was 32.9 and titrated to 15 cm H20, DME is [facility]  . Subclinical hypothyroidism     Past Surgical History:  Procedure Laterality Date  . ABDOMINAL ANGIOGRAM N/A 06/08/2014   Procedure: ABDOMINAL ANGIOGRAM;  Surgeon: Wellington Hampshire, MD;  Location: [facility] CATH LAB;  Service: Cardiovascular;  Laterality: N/A;  . ABDOMINAL AORTOGRAM N/A 04/09/2018   Procedure: ABDOMINAL AORTOGRAM;  Surgeon: Conrad Fowler, MD;  Location: [facility] CV LAB;  Service: Cardiovascular;  Laterality: N/A;  . AMPUTATION Left 04/17/2018   Procedure: LEFT FOOT 3RD RAY AMPUTATION;  Surgeon: Newt Minion, MD;  Location: [facility];  Service: Orthopedics;  Laterality: Left;  . AMPUTATION Left 05/28/2018   Procedure: LEFT AMPUTATION BELOW KNEE;  Surgeon: Wylene Simmer, MD;  Location: [facility];  Service: Orthopedics;  Laterality: Left;  . APPENDECTOMY  1965  . 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 CARDIAC CATHETERIZATION  12/2010  . CARDIOVERSION  07/2011  . CARDIOVERSION N/A 04/18/2014   Procedure: CARDIOVERSION;  Surgeon: Dorothy Spark, MD;  Location: [facility] ENDOSCOPY;  Service: Cardiovascular;  Laterality: N/A;  . CARDIOVERSION N/A 11/03/2015   Procedure: CARDIOVERSION;  Surgeon: Lelon Perla, MD;  Location: [facility] ENDOSCOPY;  Service: Cardiovascular;  Laterality: N/A;  . CARDIOVERSION N/A 05/08/2017   Procedure: CARDIOVERSION;  Surgeon: Dorothy Spark, MD;  Location:  [facility] ENDOSCOPY;  Service: Cardiovascular;  Laterality: N/A;  . CARDIOVERSION N/A 07/28/2017   Procedure: CARDIOVERSION;  Surgeon: Dorothy Spark, MD;  Location: [facility];  Service: Cardiovascular;  Laterality: N/A;  . LOWER EXTREMITY ANGIOGRAM N/A 06/08/2014   Procedure: LOWER EXTREMITY ANGIOGRAM;  Surgeon: Wellington Hampshire, MD;  Location: [facility] CATH LAB;  Service: Cardiovascular;  Laterality: N/A;  . LOWER EXTREMITY ANGIOGRAPHY Left 04/09/2018   Procedure: Lower  Extremity Angiography;  Surgeon: Conrad Denver, MD;  Location: [facility] CV LAB;  Service: Cardiovascular;  Laterality: Left;  . PACEMAKER IMPLANT N/A 12/04/2017   Procedure: PACEMAKER IMPLANT;  Surgeon: Constance Haw, MD;  Location: [facility] CV LAB;  Service: Cardiovascular;  Laterality: N/A;  . PERIPHERAL VASCULAR BALLOON ANGIOPLASTY Left 04/09/2018   Procedure: PERIPHERAL VASCULAR BALLOON ANGIOPLASTY;  Surgeon: Conrad , MD;  Location: [facility] CV LAB;  Service: Cardiovascular;  Laterality: Left;  SFA  . POPLITEAL ARTERY ANGIOPLASTY Right 06/08/2014   Archie Endo 06/08/2014  . RIGHT/LEFT HEART CATH AND CORONARY ANGIOGRAPHY N/A 10/08/2017   Procedure: RIGHT/LEFT HEART CATH AND CORONARY ANGIOGRAPHY;  Surgeon: Burnell Blanks, MD;  Location: [facility] CV LAB;  Service: Cardiovascular;  Laterality: N/A;  . SKIN CANCER EXCISION Bilateral    "have had them cut off back of neck X 2; off left upper arm; right wrist, near right shoulder blade" (06/08/2014)  . TEE WITHOUT CARDIOVERSION N/A 12/02/2017   Procedure: TRANSESOPHAGEAL ECHOCARDIOGRAM (TEE);  Surgeon: Burnell Blanks, MD;  Location: [facility];  Service: Open Heart Surgery;  Laterality: N/A;  . TEMPORARY PACEMAKER N/A 12/04/2017   Procedure: TEMPORARY PACEMAKER;  Surgeon: Leonie Man, MD;  Location: [facility] CV LAB;  Service: Cardiovascular;  Laterality: N/A;  . TRANSCATHETER AORTIC VALVE REPLACEMENT, TRANSFEMORAL N/A 12/02/2017   Procedure: TRANSCATHETER AORTIC VALVE REPLACEMENT, TRANSFEMORAL;  Surgeon: Burnell Blanks, MD;  Location: [facility];  Service: Open Heart Surgery;  Laterality: N/A;  using Edwards Sapien 3 Transcatheter Heart Valve size 17m    There were no vitals filed for this visit.     11/27/18 1322  Symptoms/Limitations  Subjective Saw the kidney doctor this morning who is running tests on kidney funtion. Continues to report pain/pressure/tenderness at "shin bone" of left leg. No falls.    Pertinent History L TTA, CAD, PAF,  pacemaker, DM2, neuropathy, CKD, HTN,   Limitations Lifting;Standing;Walking;House hold activities  Patient Stated Goals To use prosthesis in community, travel (uses vLucianne Lei, take of himself so wife does not have to do it. Housework.   Pain Assessment  Currently in Pain? Yes  Pain Score 3  Pain Location Leg  Pain Orientation Left;Anterior  Pain Descriptors / Indicators Tender;Aching;Sore  Pain Type Acute pain  Pain Onset More than a month ago  Pain Frequency Constant      11/27/18 1325  Berg Balance Test  Sit to Stand 3  Standing Unsupported 4  Sitting with Back Unsupported but Feet Supported on Floor or Stool 4  Stand to Sit 3  Transfers 3  Standing Unsupported with Eyes Closed 3  Standing Ubsupported with Feet Together 3  From Standing, Reach Forward with Outstretched Arm 3 (8 inches)  From Standing Position, Pick up Object from Floor 3  From Standing Position, Turn to Look Behind Over each Shoulder 3 (right > left)  Turn 360 Degrees 0  Standing Unsupported, Alternately Place Feet on Step/Stool 1  Standing Unsupported, One Foot in Front 2  Standing on One Leg 1  Total Score 36  Berg comment: 36/56= high risk for falls       11/27/18 1332  Ambulation/Gait  Ambulation/Gait Yes  Ambulation/Gait Assistance 5: Supervision  Ambulation/Gait Assistance Details cues on posture and for more equal step length. cues for decr'd UE reliance on RW with gait. less pain reported after sock ply adjustment.   Ambulation Distance (Feet)  (around gym wtih testing)  Assistive device Rolling walker  Gait Pattern Step-through pattern;Decreased stride length;Decreased trunk rotation;Trunk flexed  Ambulation Surface Level;Indoor  Stairs Yes  Stairs Assistance 5: Supervision  Stairs Assistance Details (indicate cue type and reason) cues to advance hands along rails for improved weight shifting  Stair Management Technique Two rails;Step to pattern;Forwards   Number of Stairs 4  Height of Stairs 6  Ramp 5: Supervision  Ramp Details (indicate cue type and reason) with RW/prosthesis with cues on posture and walker position   Prosthetics  Prosthetic Care Comments  checked limb placement in socket with patella noted to be low. increased sock ply slowly from 3 ply up to 6 ply before improved fit acheived and decreased pain reported.   Current prosthetic wear tolerance (days/week)  daily   Current prosthetic wear tolerance (#hours/day)  4 hours on, 2 hours off with use of ice pack as needed with off times  Residual limb condition  intact with no issues  Donning Prosthesis 5  Doffing Prosthesis 7       PT Short Term Goals - 11/03/18 2209      PT SHORT TERM GOAL #1   Title  Patient verbalizes adjusting ply socks with limb volume changes. (All Updated STGs Target Date: 10/30/2018)    Baseline  12/20: Pt able to adjust ply sock with limb volume change.     Time  1    Period  Months    Status  Achieved      PT SHORT TERM GOAL #2   Title  Patient tolerates prosthesis wear >12hrs total /day without skin issues.     Baseline  MET 11/02/2018    Time  1    Period  Months    Status  Achieved      PT SHORT TERM GOAL #3   Title  Patient reaches 5" & to knee level without UE support with supervision.     Baseline  12/20:  pt able to reach 5" & to knee level with no UE support, min guard.     Time  1    Period  Months    Status  Partially Met      PT SHORT TERM GOAL #4   Title  Patient ambulates 400' with RW & prosthesis with supervision.     Baseline  MET 11/02/2018    Time  1    Period  Months    Status  Achieved      PT SHORT TERM GOAL #5   Title  Patient ambulates 100' with cane & prosthesis with minA.     Baseline  NOT MET 11/02/2018  Patient has residual limb pain with increased weight bearing with cane.     Time  1    Period  Months    Status  Not Met        PT Long Term Goals - 11/27/18 1324      PT LONG TERM GOAL #1    Title  Patient verbalizes & demonstrates proper prosthetic care including donning / doffing independently to enable safe use of prosthesis (All LTGs Target Date: 11/27/2018)  Baseline  1/14: Pt verbalizes/demo proper prosthetic care, doffing independent, donning supervision.     Status  Partially Met      PT LONG TERM GOAL #2   Title  Patient tolerates prosthesis wear daily for >90% of awake hours without skin or limb pain issues to enable function throughout his day.     Baseline  1/14: Pt tolerates >90% prosthesis wear daily without skin issues, however continues to report pain with prosthetic wear    Status  Partially Met      PT LONG TERM GOAL #3   Title  Berg Balance >36/56 to indicate lower fall risk & less dependency in standing ADLs.     Status  New      PT LONG TERM GOAL #4   Title  Patient ambulates 500' with RW (or rollator) & prosthesis outdoors on paved or grass up to 50' surfaces modified independent for community mobility.     Baseline  1/14: Pt ambulates 500' with RW/prosthesis outdoors on pavement with Supervision level for assistance.     Status  Partially Met      PT LONG TERM GOAL #5   Title  Patient negotiates ramps, curbs with walker & stairs 1 rail /cane modified independent for community access.     Status  New          11/27/18 1324  Plan  Clinical Impression Statement Today's skilled session focused on progress toward LTGs with 1/5 met, 3/5 partially met and 1/5 not met. Progress has been limited by residual limb pain with prosthesis wear. Pt just recieved new socket and pain has slowly began to imrpove with proper sock ply management. Primary PT plans to recert.   Pt will benefit from skilled therapeutic intervention in order to improve on the following deficits Abnormal gait;Decreased activity tolerance;Decreased balance;Decreased endurance;Decreased knowledge of use of DME;Decreased mobility;Impaired flexibility;Decreased range of motion;Decreased  strength;Decreased skin integrity;Dizziness;Postural dysfunction;Prosthetic Dependency  Rehab Potential Good  PT Frequency 2x / week  PT Duration Other (comment)  PT Treatment/Interventions ADLs/Self Care Home Management;Canalith Repostioning;DME Instruction;Gait training;Stair training;Functional mobility training;Therapeutic activities;Therapeutic exercise;Balance training;Neuromuscular re-education;Patient/family education;Prosthetic Training;Manual techniques;Vestibular  PT Next Visit Plan check residual limb pain, see how new socket is doing with mobility, work towards updated STGs of recert  Consulted and Agree with Plan of Care Patient         Patient will benefit from skilled therapeutic intervention in order to improve the following deficits and impairments:  Abnormal gait, Decreased activity tolerance, Decreased balance, Decreased endurance, Decreased knowledge of use of DME, Decreased mobility, Impaired flexibility, Decreased range of motion, Decreased strength, Decreased skin integrity, Dizziness, Postural dysfunction, Prosthetic Dependency  Visit Diagnosis: Muscle weakness (generalized)  Other abnormalities of gait and mobility  Unsteadiness on feet  Abnormal posture     Problem List Patient Active Problem List   Diagnosis Date Noted  . Controlled type 2 diabetes mellitus without complication ([place]) 76/28/3151  . Bradycardia 09/01/2018  . Gangrene of left foot ([place]) 05/28/2018  . 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 Obstructive sleep apnea syndrome 05/04/2018  . Obstructive sleep apnea treated with continuous positive airway pressure (CPAP) 05/04/2018  . Type II diabetes mellitus, uncontrolled ([place]) 04/22/2018  . Atherosclerosis of native arteries of the extremities with gangrene ([place]) 04/09/2018  . Atherosclerosis of native artery of extremity ([place]) 04/09/2018  . Cellulitis in diabetic foot ([place]) 04/04/2018  . Paroxysmal  atrial fibrillation ([place]) 12/05/2017  . Cardiac pacemaker  in situ   . Symptomatic bradycardia   . Asystole ([place])   . 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 History of aortic valve replacement 12/02/2017  . Severe aortic stenosis   . Bilateral impacted cerumen 01/22/2016  . Essential hypertension 11/14/2015  . Peripheral vascular disease ([place]) 05/24/2014  . Chronic kidney disease 08/30/2013  . Hypothyroidism 08/30/2013  . 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 Diabetes mellitus type 2, uncomplicated ([place])   . Aortic valve stenosis 07/25/2011  . Hyperlipidemia 01/10/2011  . CAD (coronary artery disease) 01/10/2011  . Coronary arteriosclerosis 01/10/2011    [place], PTA, [facility] [address] [place], [place] [address] [phone] 11/30/18, 3:50 PM   Name: Steven Maldonado MRN: [id number] Date of Birth: 1941-07-26

## 2018-12-01 ENCOUNTER — Ambulatory Visit: Payer: Medicare Other | Admitting: Physical Therapy

## 2018-12-01 ENCOUNTER — Encounter: Payer: Self-pay | Admitting: Physical Therapy

## 2018-12-01 DIAGNOSIS — M6249 Contracture of muscle, multiple sites: Secondary | ICD-10-CM

## 2018-12-01 DIAGNOSIS — R2689 Other abnormalities of gait and mobility: Secondary | ICD-10-CM | POA: Diagnosis not present

## 2018-12-01 DIAGNOSIS — R2681 Unsteadiness on feet: Secondary | ICD-10-CM | POA: Diagnosis not present

## 2018-12-01 DIAGNOSIS — Z9181 History of falling: Secondary | ICD-10-CM | POA: Diagnosis not present

## 2018-12-01 DIAGNOSIS — R293 Abnormal posture: Secondary | ICD-10-CM

## 2018-12-01 DIAGNOSIS — M6281 Muscle weakness (generalized): Secondary | ICD-10-CM | POA: Diagnosis not present

## 2018-12-02 ENCOUNTER — Other Ambulatory Visit: Payer: Self-pay

## 2018-12-02 ENCOUNTER — Other Ambulatory Visit: Payer: Self-pay | Admitting: Nephrology

## 2018-12-02 ENCOUNTER — Ambulatory Visit (HOSPITAL_COMMUNITY): Payer: Medicare Other | Attending: Cardiology

## 2018-12-02 ENCOUNTER — Ambulatory Visit (INDEPENDENT_AMBULATORY_CARE_PROVIDER_SITE_OTHER): Payer: Medicare Other | Admitting: Physician Assistant

## 2018-12-02 ENCOUNTER — Encounter: Payer: Self-pay | Admitting: Physician Assistant

## 2018-12-02 VITALS — BP 132/64 | HR 81 | Ht 71.0 in | Wt 247.6 lb

## 2018-12-02 DIAGNOSIS — Z952 Presence of prosthetic heart valve: Secondary | ICD-10-CM

## 2018-12-02 DIAGNOSIS — I350 Nonrheumatic aortic (valve) stenosis: Secondary | ICD-10-CM | POA: Insufficient documentation

## 2018-12-02 DIAGNOSIS — N183 Chronic kidney disease, stage 3 unspecified: Secondary | ICD-10-CM

## 2018-12-02 LAB — ECHOCARDIOGRAM COMPLETE: Height: 71 in

## 2018-12-02 NOTE — Patient Instructions (Signed)
Medication Instructions:  Your physician recommends that you continue on your current medications as directed. Please refer to the Current Medication list given to you today.  If you need a refill on your cardiac medications before your next appointment, please call your pharmacy.   Lab work: None Ordered  If you have labs (blood work) drawn today and your tests are completely normal, you will receive your results only by: [name] Kitchen MyChart Message (if you have MyChart) OR . A paper copy in the mail If you have any lab test that is abnormal or we need to change your treatment, we will call you to review the results.  Testing/Procedures: None ordered  Follow-Up: At [facility], you and your health needs are our priority.  As part of our continuing mission to provide you with exceptional heart care, we have created designated Provider Care Teams.  These Care Teams include your primary Cardiologist (physician) and Advanced Practice Providers (APPs -  Physician Assistants and Nurse Practitioners) who all work together to provide you with the care you need, when you need it. . You will need a follow up appointment in 6 months.  Please call our office 2 months in advance to schedule this appointment.  You may see Darlina Guys MD or one of the following Advanced Practice Providers on your designated Care Team:   . Lyda Jester, [name] . Dayna Dunn, [name] . Ermalinda Barrios, [name]  Any Other Special Instructions Will Be Listed Below (If Applicable).

## 2018-12-02 NOTE — Therapy (Signed)
[facility] [address] [place], [name], [address] Phone: [phone]   Fax:  [phone]  Physical Therapy Treatment and Recertification  Patient Details  Name: Steven Maldonado MRN: [id number] Date of Birth: 11-29-1940 Referring Provider (PT): Mechele Claude, [place]   Encounter Date: 12/01/2018  PT End of Session - 12/01/18 1355    Visit Number  25    Number of Visits  50    Date for PT Re-Evaluation  02/26/19    Authorization Type  Medicare & Federal BCBS    Authorization Time Period  Medicare guidelines, Fed BCBS waves co-pay.  75 visit limit PT, OT & ST combined with 1 used.    Authorization - Visit Number  5    Authorization - Number of Visits  75    PT Start Time  8127    PT Stop Time  1400    PT Time Calculation (min)  45 min    Equipment Utilized During Treatment  Gait belt    Activity Tolerance  Patient tolerated treatment well    Behavior During Therapy  WFL for tasks assessed/performed       Past Medical History:  Diagnosis Date  . Chronic diastolic CHF (congestive heart failure) ([place])   . CKD (chronic kidney disease), stage III ([place])   . Constipation   . Coronary artery disease    a. Cath February 2012 in [facility], occluded RCA with collaterals  . DM type 2 (diabetes mellitus, type 2) ([place])   . Essential hypertension   . Hyperlipidemia   . Neuropathy    feet  . Pacemaker    a. symptomatic brady after TAVR s/p MDT PPM by Dr. Curt Bears 12/04/17  . Persistent atrial fibrillation   . PONV (postoperative nausea and vomiting)    after valve surgery  . PVD (peripheral vascular disease) ([place])    a. s/p R popliteal artery stenosis tx with drug-coated balloon 05/2014, followed by Dr. Fletcher Anon.  . 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 Severe aortic stenosis    a. 12/02/17: s/p TAVR  .  Skin cancer   . Sleep apnea with use of continuous positive airway pressure (CPAP)    04-11-11 AHI was 32.9 and titrated to 15 cm H20, DME is [facility]  . Subclinical hypothyroidism     Past Surgical History:  Procedure Laterality Date  . ABDOMINAL ANGIOGRAM N/A 06/08/2014   Procedure: ABDOMINAL ANGIOGRAM;  Surgeon: Wellington Hampshire, MD;  Location: [facility] CATH LAB;  Service: Cardiovascular;  Laterality: N/A;  . ABDOMINAL AORTOGRAM N/A 04/09/2018   Procedure: ABDOMINAL AORTOGRAM;  Surgeon: Conrad Makena, MD;  Location: [facility] CV LAB;  Service: Cardiovascular;  Laterality: N/A;  . AMPUTATION Left 04/17/2018   Procedure: LEFT FOOT 3RD RAY AMPUTATION;  Surgeon: Newt Minion, MD;  Location: [facility];  Service: Orthopedics;  Laterality: Left;  . AMPUTATION Left 05/28/2018   Procedure: LEFT AMPUTATION BELOW KNEE;  Surgeon: Wylene Simmer, MD;  Location: [facility];  Service: Orthopedics;  Laterality: Left;  . APPENDECTOMY  1965  . 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 CARDIAC CATHETERIZATION  12/2010  . CARDIOVERSION  07/2011  . CARDIOVERSION N/A 04/18/2014   Procedure: CARDIOVERSION;  Surgeon: Dorothy Spark, MD;  Location: [facility] ENDOSCOPY;  Service: Cardiovascular;  Laterality: N/A;  . CARDIOVERSION N/A 11/03/2015   Procedure: CARDIOVERSION;  Surgeon: Aaron Edelman  Jacalyn Lefevre, MD;  Location: [facility] ENDOSCOPY;  Service: Cardiovascular;  Laterality: N/A;  . CARDIOVERSION N/A 05/08/2017   Procedure: CARDIOVERSION;  Surgeon: Dorothy Spark, MD;  Location: [facility] ENDOSCOPY;  Service: Cardiovascular;  Laterality: N/A;  . CARDIOVERSION N/A 07/28/2017   Procedure: CARDIOVERSION;  Surgeon: Dorothy Spark, MD;  Location: [facility];  Service: Cardiovascular;  Laterality: N/A;  . LOWER EXTREMITY ANGIOGRAM N/A 06/08/2014   Procedure: LOWER EXTREMITY ANGIOGRAM;  Surgeon: Wellington Hampshire, MD;  Location: [facility] CATH LAB;  Service: Cardiovascular;  Laterality: N/A;  . LOWER EXTREMITY ANGIOGRAPHY Left 04/09/2018   Procedure: Lower  Extremity Angiography;  Surgeon: Conrad Barstow, MD;  Location: [facility] CV LAB;  Service: Cardiovascular;  Laterality: Left;  . PACEMAKER IMPLANT N/A 12/04/2017   Procedure: PACEMAKER IMPLANT;  Surgeon: Constance Haw, MD;  Location: [facility] CV LAB;  Service: Cardiovascular;  Laterality: N/A;  . PERIPHERAL VASCULAR BALLOON ANGIOPLASTY Left 04/09/2018   Procedure: PERIPHERAL VASCULAR BALLOON ANGIOPLASTY;  Surgeon: Conrad Fitchburg, MD;  Location: [facility] CV LAB;  Service: Cardiovascular;  Laterality: Left;  SFA  . POPLITEAL ARTERY ANGIOPLASTY Right 06/08/2014   Archie Endo 06/08/2014  . RIGHT/LEFT HEART CATH AND CORONARY ANGIOGRAPHY N/A 10/08/2017   Procedure: RIGHT/LEFT HEART CATH AND CORONARY ANGIOGRAPHY;  Surgeon: Burnell Blanks, MD;  Location: [facility] CV LAB;  Service: Cardiovascular;  Laterality: N/A;  . SKIN CANCER EXCISION Bilateral    "have had them cut off back of neck X 2; off left upper arm; right wrist, near right shoulder blade" (06/08/2014)  . TEE WITHOUT CARDIOVERSION N/A 12/02/2017   Procedure: TRANSESOPHAGEAL ECHOCARDIOGRAM (TEE);  Surgeon: Burnell Blanks, MD;  Location: [facility];  Service: Open Heart Surgery;  Laterality: N/A;  . TEMPORARY PACEMAKER N/A 12/04/2017   Procedure: TEMPORARY PACEMAKER;  Surgeon: Leonie Man, MD;  Location: [facility] CV LAB;  Service: Cardiovascular;  Laterality: N/A;  . TRANSCATHETER AORTIC VALVE REPLACEMENT, TRANSFEMORAL N/A 12/02/2017   Procedure: TRANSCATHETER AORTIC VALVE REPLACEMENT, TRANSFEMORAL;  Surgeon: Burnell Blanks, MD;  Location: [facility];  Service: Open Heart Surgery;  Laterality: N/A;  using Edwards Sapien 3 Transcatheter Heart Valve size 60m    There were no vitals filed for this visit.  Subjective Assessment - 12/01/18 1321    Subjective  He is wearing prosthesis most of awake hours. He is using ice pack 2x/day.     Patient is accompained by:  Family member    Pertinent History  L TTA, CAD, PAF,   pacemaker, DM2, neuropathy, CKD, HTN,     Limitations  Lifting;Standing;Walking;House hold activities    Patient Stated Goals  To use prosthesis in community, travel (uses vLucianne Lei, take of himself so wife does not have to do it. Housework.     Currently in Pain?  Yes    Pain Score  1     Pain Location  Leg    Pain Orientation  Left;Lateral    Pain Descriptors / Indicators  Tender;Aching;Sore    Pain Type  Acute pain    Pain Onset  More than a month ago    Pain Frequency  Intermittent    Aggravating Factors   increased walking with prosthesis    Pain Relieving Factors  removing prosthesis                       [facility] Adult PT Treatment/Exercise - 12/01/18 1315      Transfers   Transfers  Sit to Stand;Stand  to Sit    Sit to Stand  6: Modified independent (Device/Increase time);With upper extremity assist;With armrests;From chair/3-in-1   requires RW to stabilize   Stand to Sit  6: Modified independent (Device/Increase time);With upper extremity assist;With armrests;To chair/3-in-1   requires RW for stability     Ambulation/Gait   Ambulation/Gait  Yes    Ambulation/Gait Assistance  5: Supervision    Ambulation Distance (Feet)  300 Feet    Assistive device  Rolling walker;Prosthesis    Gait Pattern  Step-through pattern;Decreased stride length;Decreased trunk rotation;Trunk flexed    Ambulation Surface  Indoor;Level    Stairs  Yes    Stairs Assistance  5: Supervision    Stair Management Technique  Two rails;Step to pattern;Forwards    Number of Stairs  4    Height of Stairs  6    Ramp  5: Supervision   RW & prosthesis   Curb  5: Supervision   RW & supervision     Prosthetics   Prosthetic Care Comments   reviewed adjusting ply socks, proper donning including position of liner & socket rotation (using U-relief of anterior socket & patella centered in U)     Current prosthetic wear tolerance (days/week)   daily     Current prosthetic wear tolerance (#hours/day)   4  hours on, 2 hours off with use of ice pack as needed with off times    Residual limb condition   intact with no issues    Education Provided  Skin check;Residual limb care;Correct ply sock adjustment;Proper Donning;Proper wear schedule/adjustment    Person(s) Educated  Patient    Education Method  Explanation;Verbal cues;Demonstration    Education Method  Verbalized understanding;Needs further instruction    Donning Prosthesis  Supervision    Doffing Prosthesis  Independent             PT Education - 12/01/18 1430    Education Details  ice massage vs ice pack, amputation nerve pain, updated / new plan of care,    Person(s) Educated  Patient    Methods  Explanation;Verbal cues    Comprehension  Verbalized understanding       PT Short Term Goals - 12/01/18 1400      PT SHORT TERM GOAL #1   Title  Patient demonstrates proper donning without cues modified independent. (All updated STGs Target Date: 01/01/2019)    Time  1    Period  Months    Status  New    Target Date  01/01/19      PT SHORT TERM GOAL #2   Title  Patient tolerates prosthesis wear >12 hrs total/day with limb pain </= 2/10.     Time  1    Period  Months    Status  New    Target Date  01/01/19      PT SHORT TERM GOAL #3   Title  Patient negotiates ramps & curbs with RW & prosthesis with supervision.     Time  1    Period  Months    Status  New    Target Date  01/01/19      PT SHORT TERM GOAL #4   Title  Patient ambulates 33' with cane & moderate hand hold assist.     Time  1    Period  Months    Status  New    Target Date  01/01/19        PT Long Term Goals - 11/27/18 1324  PT LONG TERM GOAL #1   Title  Patient verbalizes & demonstrates proper prosthetic care including donning / doffing independently to enable safe use of prosthesis (All LTGs Target Date: 11/27/2018)    Baseline  1/14: Pt verbalizes/demo proper prosthetic care, doffing independent, donning supervision.     Status  Partially  Met      PT LONG TERM GOAL #2   Title  Patient tolerates prosthesis wear daily for >90% of awake hours without skin or limb pain issues to enable function throughout his day.     Baseline  1/14: Pt tolerates >90% prosthesis wear daily without skin issues, however continues to report pain with prosthetic wear    Status  Partially Met      PT LONG TERM GOAL #3   Title  Berg Balance >36/56 to indicate lower fall risk & less dependency in standing ADLs.     Baseline  11/27/18: 36/56 scored today    Status  Achieved      PT LONG TERM GOAL #4   Title  Patient ambulates 500' with RW (or rollator) & prosthesis outdoors on paved or grass up to 50' surfaces modified independent for community mobility.     Baseline  1/14: Pt ambulates 500' with RW/prosthesis outdoors on pavement with Supervision level for assistance.     Status  Partially Met      PT LONG TERM GOAL #5   Title  Patient negotiates ramps, curbs with walker & stairs 1 rail /cane modified independent for community access.     Baseline  11/27/18: supervision with RW/prosthesis, stairs continues to need 2 rails.     Status  Not Met         PT Long Term Goals - 12/01/18 1900      PT LONG TERM GOAL #1   Title  Patient verbalizes & demonstrates proper prosthetic care including donning / doffing independently to enable safe use of prosthesis (All LTGs Target Date: 02/26/2019)    Time  3    Period  Months    Status  On-going    Target Date  02/26/19      PT LONG TERM GOAL #2   Title  Patient tolerates prosthesis wear daily for >90% of awake hours without skin or limb pain issues to enable function throughout his day.     Time  3    Period  Months    Status  On-going    Target Date  02/26/19      PT LONG TERM GOAL #3   Title  Berg Balance >/= 45/56 to indicate lower fall risk & less dependency in standing ADLs.     Time  3    Period  Months    Status  Revised    Target Date  02/26/19      PT LONG TERM GOAL #4   Title  Patient  ambulates 600' with RW (or rollator) & prosthesis outdoors on paved or grass up to 50' surfaces modified independent for community mobility.     Time  3    Period  Months    Status  Revised    Target Date  02/26/19      PT LONG TERM GOAL #5   Title  Patient negotiates ramps, curbs with walker & stairs 1 rail /cane modified independent for community access.     Time  3    Period  Months    Status  On-going    Target Date  02/26/19  Additional Long Term Goals   Additional Long Term Goals  Yes      PT LONG TERM GOAL #6   Title  Patient ambulates 37' around furniture with cane & prosthesis modified independent for household mobility.     Time  3    Period  Months    Status  New    Target Date  02/26/19           Plan - 12/01/18 1800    Clinical Impression Statement  Patient fully met LTG of Berg Balance to 36/56 but still indicates high fall risk. He partially met 3 other LTGs with significant progress but not fully met. He has general understanding of prosthetic care including now able to donne without assistance / cues only. He has increased wear but is limited by lateral residual limb pain that seems to be nerve pain with possible neuroma. He has improved his prosthetic gait with walker but requires assist to negotiate ramps & curbs. Patient appears would benefit from additional PT services to progress his level of function with prosthesis including wear & care.     Rehab Potential  Good    PT Frequency  2x / week    PT Duration  Other (comment)   90 days (13 weeks)   PT Treatment/Interventions  ADLs/Self Care Home Management;Canalith Repostioning;DME Instruction;Gait training;Stair training;Functional mobility training;Therapeutic activities;Therapeutic exercise;Balance training;Neuromuscular re-education;Patient/family education;Prosthetic Training;Manual techniques;Vestibular    PT Next Visit Plan  check residual limb pain, work towards updated STGs    Consulted and Agree  with Plan of Care  Patient       Patient will benefit from skilled therapeutic intervention in order to improve the following deficits and impairments:  Abnormal gait, Decreased activity tolerance, Decreased balance, Decreased endurance, Decreased knowledge of use of DME, Decreased mobility, Impaired flexibility, Decreased range of motion, Decreased strength, Decreased skin integrity, Dizziness, Postural dysfunction, Prosthetic Dependency  Visit Diagnosis: Muscle weakness (generalized)  Other abnormalities of gait and mobility  Unsteadiness on feet  Abnormal posture  Contracture of muscle, multiple sites  History of falling     Problem List Patient Active Problem List   Diagnosis Date Noted  . Controlled type 2 diabetes mellitus without complication ([place]) 69/62/9528  . Bradycardia 09/01/2018  . Gangrene of left foot ([place]) 05/28/2018  . 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 Obstructive sleep apnea syndrome 05/04/2018  . Obstructive sleep apnea treated with continuous positive airway pressure (CPAP) 05/04/2018  . Type II diabetes mellitus, uncontrolled ([place]) 04/22/2018  . Atherosclerosis of native arteries of the extremities with gangrene ([place]) 04/09/2018  . Atherosclerosis of native artery of extremity ([place]) 04/09/2018  . Cellulitis in diabetic foot ([place]) 04/04/2018  . Paroxysmal atrial fibrillation ([place]) 12/05/2017  . Cardiac pacemaker in situ   . Symptomatic bradycardia   . Asystole ([place])   . 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 History of aortic valve replacement 12/02/2017  . Severe aortic stenosis   . Bilateral impacted cerumen 01/22/2016  . Essential hypertension 11/14/2015  . Peripheral vascular disease ([place]) 05/24/2014  . Chronic kidney disease 08/30/2013  . Hypothyroidism 08/30/2013  . 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 Diabetes mellitus type 2, uncomplicated ([place])   .  Aortic valve stenosis 07/25/2011  . Hyperlipidemia 01/10/2011  . CAD (coronary artery disease) 01/10/2011  . Coronary arteriosclerosis 01/10/2011    Delva Derden PT, DPT 12/02/2018, 12:27 PM  [facility]  [facility] [address] [place], [name], [address] Phone: [phone]   Fax:  [phone]  Name: Steven Maldonado MRN: [id number] Date of Birth: 08-06-1941

## 2018-12-03 ENCOUNTER — Ambulatory Visit: Payer: Medicare Other | Admitting: Physical Therapy

## 2018-12-03 ENCOUNTER — Encounter: Payer: Self-pay | Admitting: Physical Therapy

## 2018-12-03 DIAGNOSIS — R2689 Other abnormalities of gait and mobility: Secondary | ICD-10-CM

## 2018-12-03 DIAGNOSIS — M6281 Muscle weakness (generalized): Secondary | ICD-10-CM | POA: Diagnosis not present

## 2018-12-03 DIAGNOSIS — R2681 Unsteadiness on feet: Secondary | ICD-10-CM | POA: Diagnosis not present

## 2018-12-03 DIAGNOSIS — R293 Abnormal posture: Secondary | ICD-10-CM | POA: Diagnosis not present

## 2018-12-03 DIAGNOSIS — Z9181 History of falling: Secondary | ICD-10-CM | POA: Diagnosis not present

## 2018-12-03 DIAGNOSIS — M6249 Contracture of muscle, multiple sites: Secondary | ICD-10-CM | POA: Diagnosis not present

## 2018-12-04 ENCOUNTER — Telehealth: Payer: Self-pay | Admitting: Endocrinology

## 2018-12-04 NOTE — Telephone Encounter (Signed)
error 

## 2018-12-06 NOTE — Therapy (Signed)
[facility] [address] [place], [name], [address] Phone: [phone]   Fax:  [phone]  Physical Therapy Treatment  Patient Details  Name: AVIS MCMAHILL MRN: [id number] Date of Birth: 23-Oct-1941 Referring Provider (PT): Mechele Claude, [place]   Encounter Date: 12/03/2018    12/03/18 1322  PT Visits / Re-Eval  Visit Number 26  Number of Visits 50  Date for PT Re-Evaluation 02/26/19  Authorization  Authorization Type Medicare & Federal BCBS  Authorization Time Period Medicare guidelines, Fed BCBS waves co-pay.  75 visit limit PT, OT & ST combined with 1 used.  Authorization - Visit Number 6  Authorization - Number of Visits 75  PT Time Calculation  PT Start Time 2355  PT Stop Time 1357  PT Time Calculation (min) 39 min  PT - End of Session  Equipment Utilized During Treatment Gait belt  Activity Tolerance Patient tolerated treatment well;No increased pain;Patient limited by fatigue  Behavior During Therapy [facility] for tasks assessed/performed     Past Medical History:  Diagnosis Date  . Chronic diastolic CHF (congestive heart failure) ([place])   . CKD (chronic kidney disease), stage III ([place])   . Constipation   . Coronary artery disease    a. Cath February 2012 in [place], occluded RCA with collaterals  . DM type 2 (diabetes mellitus, type 2) ([place])   . Essential hypertension   . Hyperlipidemia   . Neuropathy    feet  . Pacemaker    a. symptomatic brady after TAVR s/p MDT PPM by Dr. Curt Bears 12/04/17  . Persistent atrial fibrillation   . PONV (postoperative nausea and vomiting)    after valve surgery  . PVD (peripheral vascular disease) ([place])    a. s/p R popliteal artery stenosis tx with drug-coated balloon 05/2014, followed by Dr. Fletcher Anon.  . 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  Severe aortic stenosis    a. 12/02/17: s/p TAVR  . Skin cancer   . Sleep apnea with use of continuous positive airway pressure (CPAP)    04-11-11 AHI was 32.9 and titrated to 15 cm H20, DME is [facility]  . Subclinical hypothyroidism     Past Surgical History:  Procedure Laterality Date  . ABDOMINAL ANGIOGRAM N/A 06/08/2014   Procedure: ABDOMINAL ANGIOGRAM;  Surgeon: Wellington Hampshire, MD;  Location: [facility] CATH LAB;  Service: Cardiovascular;  Laterality: N/A;  . ABDOMINAL AORTOGRAM N/A 04/09/2018   Procedure: ABDOMINAL AORTOGRAM;  Surgeon: Conrad Blair, MD;  Location: [place] CV LAB;  Service: Cardiovascular;  Laterality: N/A;  . AMPUTATION Left 04/17/2018   Procedure: LEFT FOOT 3RD RAY AMPUTATION;  Surgeon: Newt Minion, MD;  Location: [place];  Service: Orthopedics;  Laterality: Left;  . AMPUTATION Left 05/28/2018   Procedure: LEFT AMPUTATION BELOW KNEE;  Surgeon: Wylene Simmer, MD;  Location: [place];  Service: Orthopedics;  Laterality: Left;  . APPENDECTOMY  1965  . 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 CARDIAC CATHETERIZATION  12/2010  . CARDIOVERSION  07/2011  . CARDIOVERSION N/A 04/18/2014   Procedure: CARDIOVERSION;  Surgeon: Dorothy Spark, MD;  Location: [facility] ENDOSCOPY;  Service: Cardiovascular;  Laterality: N/A;  . CARDIOVERSION N/A 11/03/2015   Procedure: CARDIOVERSION;  Surgeon: Lelon Perla, MD;  Location: [facility] ENDOSCOPY;  Service: Cardiovascular;  Laterality: N/A;  . CARDIOVERSION N/A 05/08/2017   Procedure: CARDIOVERSION;  Surgeon:  Dorothy Spark, MD;  Location: [facility] ENDOSCOPY;  Service: Cardiovascular;  Laterality: N/A;  . CARDIOVERSION N/A 07/28/2017   Procedure: CARDIOVERSION;  Surgeon: Dorothy Spark, MD;  Location: [place];  Service: Cardiovascular;  Laterality: N/A;  . LOWER EXTREMITY ANGIOGRAM N/A 06/08/2014   Procedure: LOWER EXTREMITY ANGIOGRAM;  Surgeon: Wellington Hampshire, MD;  Location: [place] CATH LAB;  Service: Cardiovascular;  Laterality: N/A;  . LOWER EXTREMITY  ANGIOGRAPHY Left 04/09/2018   Procedure: Lower Extremity Angiography;  Surgeon: Conrad Liberty, MD;  Location: [place] CV LAB;  Service: Cardiovascular;  Laterality: Left;  . PACEMAKER IMPLANT N/A 12/04/2017   Procedure: PACEMAKER IMPLANT;  Surgeon: Constance Haw, MD;  Location: [place] CV LAB;  Service: Cardiovascular;  Laterality: N/A;  . PERIPHERAL VASCULAR BALLOON ANGIOPLASTY Left 04/09/2018   Procedure: PERIPHERAL VASCULAR BALLOON ANGIOPLASTY;  Surgeon: Conrad [place], MD;  Location: [place] CV LAB;  Service: Cardiovascular;  Laterality: Left;  SFA  . POPLITEAL ARTERY ANGIOPLASTY Right 06/08/2014   Archie Endo 06/08/2014  . RIGHT/LEFT HEART CATH AND CORONARY ANGIOGRAPHY N/A 10/08/2017   Procedure: RIGHT/LEFT HEART CATH AND CORONARY ANGIOGRAPHY;  Surgeon: Burnell Blanks, MD;  Location: [place] CV LAB;  Service: Cardiovascular;  Laterality: N/A;  . SKIN CANCER EXCISION Bilateral    "have had them cut off back of neck X 2; off left upper arm; right wrist, near right shoulder blade" (06/08/2014)  . TEE WITHOUT CARDIOVERSION N/A 12/02/2017   Procedure: TRANSESOPHAGEAL ECHOCARDIOGRAM (TEE);  Surgeon: Burnell Blanks, MD;  Location: [place];  Service: Open Heart Surgery;  Laterality: N/A;  . TEMPORARY PACEMAKER N/A 12/04/2017   Procedure: TEMPORARY PACEMAKER;  Surgeon: Leonie Man, MD;  Location: [place] CV LAB;  Service: Cardiovascular;  Laterality: N/A;  . TRANSCATHETER AORTIC VALVE REPLACEMENT, TRANSFEMORAL N/A 12/02/2017   Procedure: TRANSCATHETER AORTIC VALVE REPLACEMENT, TRANSFEMORAL;  Surgeon: Burnell Blanks, MD;  Location: [place];  Service: Open Heart Surgery;  Laterality: N/A;  using Edwards Sapien 3 Transcatheter Heart Valve size 76mm    There were no vitals filed for this visit.     12/03/18 1320  Symptoms/Limitations  Subjective No falls. Reports to therapy stating he needs to adjust his socks as it was tender walking into the lobby.    Pertinent History L TTA, CAD, PAF,  pacemaker, DM2, neuropathy, CKD, HTN,   Limitations Lifting;Standing;Walking;House hold activities  Patient Stated Goals To use prosthesis in community, travel (uses Lucianne Lei), take of himself so wife does not have to do it. Housework.   Pain Assessment  Currently in Pain? Yes  Pain Score 2  Pain Location Leg  Pain Orientation Left;Lateral  Pain Descriptors / Indicators Aching;Sore;Tender  Pain Type Acute pain  Pain Onset More than a month ago  Pain Frequency Intermittent  Aggravating Factors  increased weight bearing on prosthesis  Pain Relieving Factors removing prosthesis      12/03/18 1322  Transfers  Transfers Sit to Stand;Stand to Sit  Sit to Stand 6: Modified independent (Device/Increase time);With upper extremity assist;With armrests;From chair/3-in-1  Stand to Sit 6: Modified independent (Device/Increase time);With upper extremity assist;With armrests;To chair/3-in-1  Ambulation/Gait  Ambulation/Gait Yes  Ambulation/Gait Assistance 5: Supervision  Ambulation/Gait Assistance Details cues on posture, decreased UE support on walker, and for equal step length; with cane/prosthesis: several short distances performed that had gradual increase to total 115 feet (15 ft, 35 ft, 35 ft, 50 ft), min to mod HHA with cues on posture, weight shifting and step length.  Ambulation Distance (Feet) 415 Feet (x1, 115 x1 with cane (see comments))  Assistive device Rolling walker;Prosthesis;Straight cane (cane with rubber quad tip)  Gait Pattern Step-through pattern;Decreased stride length;Decreased trunk rotation;Trunk flexed  Ambulation Surface Level;Indoor  Stairs Yes  Stairs Assistance 4: Min guard  Stairs Assistance Details (indicate cue type and reason) cues on sequencing with cane/rail combo  Stair Management Technique One rail Left;Step to pattern;Forwards;With cane  Number of Stairs 4  Ramp 5: Supervision  Ramp Details (indicate cue type and reason)  with RW/prosthesis, cues on sequence and walker position   Curb 5: Supervision  Curb Details (indicate cue type and reason) with RW/prosthesis, cues on stance position with walker advancement  Prosthetics  Current prosthetic wear tolerance (days/week)  daily   Current prosthetic wear tolerance (#hours/day)  4 hours on, 2 hours off with use of ice pack as needed with off times  Residual limb condition  intact with no issues  Donning Prosthesis 5  Doffing Prosthesis 7          PT Short Term Goals - 12/01/18 1400      PT SHORT TERM GOAL #1   Title  Patient demonstrates proper donning without cues modified independent. (All updated STGs Target Date: 01/01/2019)    Time  1    Period  Months    Status  New    Target Date  01/01/19      PT SHORT TERM GOAL #2   Title  Patient tolerates prosthesis wear >12 hrs total/day with limb pain </= 2/10.     Time  1    Period  Months    Status  New    Target Date  01/01/19      PT SHORT TERM GOAL #3   Title  Patient negotiates ramps & curbs with RW & prosthesis with supervision.     Time  1    Period  Months    Status  New    Target Date  01/01/19      PT SHORT TERM GOAL #4   Title  Patient ambulates 38' with cane & moderate hand hold assist.     Time  1    Period  Months    Status  New    Target Date  01/01/19        PT Long Term Goals - 12/01/18 1900      PT LONG TERM GOAL #1   Title  Patient verbalizes & demonstrates proper prosthetic care including donning / doffing independently to enable safe use of prosthesis (All LTGs Target Date: 02/26/2019)    Time  3    Period  Months    Status  On-going    Target Date  02/26/19      PT LONG TERM GOAL #2   Title  Patient tolerates prosthesis wear daily for >90% of awake hours without skin or limb pain issues to enable function throughout his day.     Time  3    Period  Months    Status  On-going    Target Date  02/26/19      PT LONG TERM GOAL #3   Title  Berg Balance >/= 45/56  to indicate lower fall risk & less dependency in standing ADLs.     Time  3    Period  Months    Status  Revised    Target Date  02/26/19      PT LONG TERM GOAL #4   Title  Patient ambulates 600' with RW (or rollator) & prosthesis outdoors on paved or grass up to 50' surfaces modified independent for community mobility.     Time  3    Period  Months    Status  Revised    Target Date  02/26/19      PT LONG TERM GOAL #5   Title  Patient negotiates ramps, curbs with walker & stairs 1 rail /cane modified independent for community access.     Time  3    Period  Months    Status  On-going    Target Date  02/26/19      Additional Long Term Goals   Additional Long Term Goals  Yes      PT LONG TERM GOAL #6   Title  Patient ambulates 22' around furniture with cane & prosthesis modified independent for household mobility.     Time  3    Period  Months    Status  New    Target Date  02/26/19         12/03/18 1322  Plan  Clinical Impression Statement Today's skilled session continued to focus on barriers with walker/prosthesis combined and gait on level surfaces with prosthesis/cane. No increase in pain reported. The pt did report feeling "pressure" at times that was relieved with rest breaks. The pt is progressing toward goals and should benefit from continued PT to progress toward unmet goals.   Pt will benefit from skilled therapeutic intervention in order to improve on the following deficits Abnormal gait;Decreased activity tolerance;Decreased balance;Decreased endurance;Decreased knowledge of use of DME;Decreased mobility;Impaired flexibility;Decreased range of motion;Decreased strength;Decreased skin integrity;Dizziness;Postural dysfunction;Prosthetic Dependency  Rehab Potential Good  PT Frequency 2x / week  PT Duration Other (comment) (90 days (13 weeks))  PT Treatment/Interventions ADLs/Self Care Home Management;Canalith Repostioning;DME Instruction;Gait training;Stair  training;Functional mobility training;Therapeutic activities;Therapeutic exercise;Balance training;Neuromuscular re-education;Patient/family education;Prosthetic Training;Manual techniques;Vestibular  PT Next Visit Plan check residual limb pain, work towards updated STGs  Consulted and Agree with Plan of Care Patient          Patient will benefit from skilled therapeutic intervention in order to improve the following deficits and impairments:  Abnormal gait, Decreased activity tolerance, Decreased balance, Decreased endurance, Decreased knowledge of use of DME, Decreased mobility, Impaired flexibility, Decreased range of motion, Decreased strength, Decreased skin integrity, Dizziness, Postural dysfunction, Prosthetic Dependency  Visit Diagnosis: Muscle weakness (generalized)  Other abnormalities of gait and mobility  Unsteadiness on feet  Abnormal posture     Problem List Patient Active Problem List   Diagnosis Date Noted  . Controlled type 2 diabetes mellitus without complication ([place]) 60/73/7106  . Bradycardia 09/01/2018  . Gangrene of left foot ([place]) 05/28/2018  . 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 Obstructive sleep apnea syndrome 05/04/2018  . Obstructive sleep apnea treated with continuous positive airway pressure (CPAP) 05/04/2018  . Type II diabetes mellitus, uncontrolled ([place]) 04/22/2018  . Atherosclerosis of native arteries of the extremities with gangrene ([place]) 04/09/2018  . Atherosclerosis of native artery of extremity ([place]) 04/09/2018  . Cellulitis in diabetic foot ([place]) 04/04/2018  . Paroxysmal atrial fibrillation ([place]) 12/05/2017  . Cardiac pacemaker in situ   . Symptomatic bradycardia   . Asystole ([place])   . 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 History of aortic valve replacement 12/02/2017  . Severe aortic stenosis   . Bilateral impacted cerumen 01/22/2016  . Essential hypertension  11/14/2015  . Peripheral vascular  disease ([place]) 05/24/2014  . Chronic kidney disease 08/30/2013  . Hypothyroidism 08/30/2013  . 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 Diabetes mellitus type 2, uncomplicated ([place])   . Aortic valve stenosis 07/25/2011  . Hyperlipidemia 01/10/2011  . CAD (coronary artery disease) 01/10/2011  . Coronary arteriosclerosis 01/10/2011    Willow Ora, PTA, [place] [address], [place], [place] [address] [phone] 12/06/18, 5:10 PM   Name: ARIK HUSMANN MRN: [id number] Date of Birth: 1941-08-02

## 2018-12-07 ENCOUNTER — Ambulatory Visit: Payer: Medicare Other | Admitting: Physical Therapy

## 2018-12-07 ENCOUNTER — Telehealth: Payer: Self-pay | Admitting: Endocrinology

## 2018-12-07 NOTE — Telephone Encounter (Signed)
He can reduce his long-acting insulin to 30 units now

## 2018-12-07 NOTE — Telephone Encounter (Signed)
Patient has quit taking the 2 shots of insulin during the day and only takes 40 units at night. When he checks his blood sugars in the am its is running between 55-70  Patients wife would like to know if he should be seen by the Dr or if there is something else that needs to be done.   Please advise

## 2018-12-07 NOTE — Telephone Encounter (Signed)
Called and left voicemail for pt to call back to give him MD message.

## 2018-12-07 NOTE — Telephone Encounter (Signed)
Please advise 

## 2018-12-08 ENCOUNTER — Encounter: Payer: Self-pay | Admitting: Physical Therapy

## 2018-12-08 ENCOUNTER — Ambulatory Visit: Payer: Medicare Other | Admitting: Physical Therapy

## 2018-12-08 DIAGNOSIS — M6249 Contracture of muscle, multiple sites: Secondary | ICD-10-CM

## 2018-12-08 DIAGNOSIS — R2689 Other abnormalities of gait and mobility: Secondary | ICD-10-CM

## 2018-12-08 DIAGNOSIS — R2681 Unsteadiness on feet: Secondary | ICD-10-CM

## 2018-12-08 DIAGNOSIS — Z9181 History of falling: Secondary | ICD-10-CM | POA: Diagnosis not present

## 2018-12-08 DIAGNOSIS — R293 Abnormal posture: Secondary | ICD-10-CM | POA: Diagnosis not present

## 2018-12-08 DIAGNOSIS — M6281 Muscle weakness (generalized): Secondary | ICD-10-CM

## 2018-12-08 NOTE — Patient Instructions (Signed)
Access Code: [email or web address]  URL: [email or web address]  Date: 12/08/2018  Prepared by: Jamey Reas   Exercises  Seated Hamstring Stretch - 3 reps - 1 sets - 30 seconds hold - 2x daily - 7x weekly  Seated Gastroc Stretch with Strap - 3 reps - 1 sets - 30 seconds hold - 2x daily - 7x weekly  Seated Hamstring Stretch with Strap - 3 reps - 1 sets - 30 seconds hold - 2x daily - 7x weekly

## 2018-12-08 NOTE — Telephone Encounter (Signed)
[name] called back and was given MD message. She verbalized understanding.

## 2018-12-09 ENCOUNTER — Other Ambulatory Visit: Payer: Self-pay | Admitting: *Deleted

## 2018-12-09 NOTE — Therapy (Signed)
[facility] [address] [place], [name], [address] Phone: [phone]   Fax:  [phone]  Physical Therapy Treatment  Patient Details  Name: Steven Maldonado MRN: [id number] Date of Birth: 1941/04/12 Referring Provider (PT): Mechele Claude, [place]   Encounter Date: 12/08/2018  PT End of Session - 12/08/18 1101    Visit Number  27    Number of Visits  50    Date for PT Re-Evaluation  02/26/19    Authorization Type  Medicare & Federal BCBS    Authorization Time Period  Medicare guidelines, Fed BCBS waves co-pay.  75 visit limit PT, OT & ST combined with 1 used.    Authorization - Visit Number  7    Authorization - Number of Visits  75    PT Start Time  6503    PT Stop Time  1100    PT Time Calculation (min)  45 min    Equipment Utilized During Treatment  Gait belt    Activity Tolerance  Patient tolerated treatment well;No increased pain;Patient limited by fatigue    Behavior During Therapy  [facility] for tasks assessed/performed       Past Medical History:  Diagnosis Date  . Chronic diastolic CHF (congestive heart failure) ([place])   . CKD (chronic kidney disease), stage III ([place])   . Constipation   . Coronary artery disease    a. Cath February 2012 in [place], occluded RCA with collaterals  . DM type 2 (diabetes mellitus, type 2) ([place])   . Essential hypertension   . Hyperlipidemia   . Neuropathy    feet  . Pacemaker    a. symptomatic brady after TAVR s/p MDT PPM by Dr. Curt Bears 12/04/17  . Persistent atrial fibrillation   . PONV (postoperative nausea and vomiting)    after valve surgery  . PVD (peripheral vascular disease) ([place])    a. s/p R popliteal artery stenosis tx with drug-coated balloon 05/2014, followed by Dr. Fletcher Anon.  . 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 Severe aortic stenosis    a. 12/02/17: s/p TAVR  . Skin cancer   . Sleep apnea with use of continuous positive airway pressure (CPAP)    04-11-11 AHI was 32.9 and titrated to 15 cm H20, DME is [facility]  . Subclinical hypothyroidism     Past Surgical History:  Procedure Laterality Date  . ABDOMINAL ANGIOGRAM N/A 06/08/2014   Procedure: ABDOMINAL ANGIOGRAM;  Surgeon: Wellington Hampshire, MD;  Location: [facility] CATH LAB;  Service: Cardiovascular;  Laterality: N/A;  . ABDOMINAL AORTOGRAM N/A 04/09/2018   Procedure: ABDOMINAL AORTOGRAM;  Surgeon: Conrad Gem, MD;  Location: [place] CV LAB;  Service: Cardiovascular;  Laterality: N/A;  . AMPUTATION Left 04/17/2018   Procedure: LEFT FOOT 3RD RAY AMPUTATION;  Surgeon: Newt Minion, MD;  Location: [place];  Service: Orthopedics;  Laterality: Left;  . AMPUTATION Left 05/28/2018   Procedure: LEFT AMPUTATION BELOW KNEE;  Surgeon: Wylene Simmer, MD;  Location: [place];  Service: Orthopedics;  Laterality: Left;  . APPENDECTOMY  1965  . 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 CARDIAC CATHETERIZATION  12/2010  . CARDIOVERSION  07/2011  . CARDIOVERSION N/A 04/18/2014   Procedure: CARDIOVERSION;  Surgeon: Dorothy Spark, MD;  Location: [facility] ENDOSCOPY;  Service: Cardiovascular;  Laterality: N/A;  . CARDIOVERSION N/A 11/03/2015   Procedure: CARDIOVERSION;  Surgeon: Lelon Perla, MD;  Location: [facility] ENDOSCOPY;  Service: Cardiovascular;  Laterality: N/A;  . CARDIOVERSION N/A 05/08/2017   Procedure: CARDIOVERSION;  Surgeon: Dorothy Spark, MD;  Location: [facility] ENDOSCOPY;  Service: Cardiovascular;  Laterality: N/A;  . CARDIOVERSION N/A 07/28/2017   Procedure: CARDIOVERSION;  Surgeon: [place] Spark, MD;  Location: [place];  Service: Cardiovascular;  Laterality: N/A;  . LOWER EXTREMITY ANGIOGRAM N/A 06/08/2014   Procedure: LOWER EXTREMITY ANGIOGRAM;  Surgeon: Wellington Hampshire, MD;  Location: [facility] CATH LAB;  Service: Cardiovascular;  Laterality: N/A;  . LOWER EXTREMITY ANGIOGRAPHY Left 04/09/2018    Procedure: Lower Extremity Angiography;  Surgeon: Conrad [place], MD;  Location: [place] CV LAB;  Service: Cardiovascular;  Laterality: Left;  . PACEMAKER IMPLANT N/A 12/04/2017   Procedure: PACEMAKER IMPLANT;  Surgeon: Constance Haw, MD;  Location: [place] CV LAB;  Service: Cardiovascular;  Laterality: N/A;  . PERIPHERAL VASCULAR BALLOON ANGIOPLASTY Left 04/09/2018   Procedure: PERIPHERAL VASCULAR BALLOON ANGIOPLASTY;  Surgeon: Conrad [place], MD;  Location: [place] CV LAB;  Service: Cardiovascular;  Laterality: Left;  SFA  . POPLITEAL ARTERY ANGIOPLASTY Right 06/08/2014   Archie Endo 06/08/2014  . RIGHT/LEFT HEART CATH AND CORONARY ANGIOGRAPHY N/A 10/08/2017   Procedure: RIGHT/LEFT HEART CATH AND CORONARY ANGIOGRAPHY;  Surgeon: Burnell Blanks, MD;  Location: [place] CV LAB;  Service: Cardiovascular;  Laterality: N/A;  . SKIN CANCER EXCISION Bilateral    "have had them cut off back of neck X 2; off left upper arm; right wrist, near right shoulder blade" (06/08/2014)  . TEE WITHOUT CARDIOVERSION N/A 12/02/2017   Procedure: TRANSESOPHAGEAL ECHOCARDIOGRAM (TEE);  Surgeon: Burnell Blanks, MD;  Location: [place];  Service: Open Heart Surgery;  Laterality: N/A;  . TEMPORARY PACEMAKER N/A 12/04/2017   Procedure: TEMPORARY PACEMAKER;  Surgeon: Leonie Man, MD;  Location: [place] CV LAB;  Service: Cardiovascular;  Laterality: N/A;  . TRANSCATHETER AORTIC VALVE REPLACEMENT, TRANSFEMORAL N/A 12/02/2017   Procedure: TRANSCATHETER AORTIC VALVE REPLACEMENT, TRANSFEMORAL;  Surgeon: Burnell Blanks, MD;  Location: [place];  Service: Open Heart Surgery;  Laterality: N/A;  using Edwards Sapien 3 Transcatheter Heart Valve size 55mm    There were no vitals filed for this visit.  Subjective Assessment - 12/08/18 1017    Subjective  He is wearing prosthesis 4-5 hrs 2x/day. Using ice pack. Today is good day for pain but yesterday was not. He tried to change ply socks and  realign liner.     Pertinent History  L TTA, CAD, PAF,  pacemaker, DM2, neuropathy, CKD, HTN,     Limitations  Lifting;Standing;Walking;House hold activities    Patient Stated Goals  To use prosthesis in community, travel (uses [employer]ianne Lei), take of himself so wife does not have to do it. Housework.     Currently in Pain?  Yes    Pain Score  1     Pain Location  Leg    Pain Orientation  Left;Lateral    Pain Descriptors / Indicators  Aching;Sore;Tender    Pain Type  Acute pain    Pain Onset  More than a month ago    Pain Frequency  Intermittent    Aggravating Factors   increased weight bearing on prosthesis     Pain Relieving Factors  removing                       [facility] Adult PT Treatment/Exercise - 12/08/18 1015  Transfers   Transfers  Sit to Stand;Stand to Sit    Sit to Stand  6: Modified independent (Device/Increase time);With upper extremity assist;With armrests;From chair/3-in-1    Stand to Sit  6: Modified independent (Device/Increase time);With upper extremity assist;With armrests;To chair/3-in-1      Ambulation/Gait   Ambulation/Gait  Yes    Ambulation/Gait Assistance  5: Supervision;4: Min assist   MinA cane & supervision RW   Ambulation/Gait Assistance Details  demo, verbal & tactile cues on proper step width / not abducting & step length, wt shift over prosthesis in stance    Ambulation Distance (Feet)  250 Feet   11' X 3 adjusting ply socks, 250' X 2   Assistive device  Rolling walker;Prosthesis;Straight cane   cane with rubber quad tip   Gait Pattern  Step-through pattern;Decreased stride length;Decreased trunk rotation;Trunk flexed    Ambulation Surface  Indoor;Level    Stairs  --    Stairs Assistance  --    Stair Management Technique  --    Number of Stairs  --    Ramp  --    Curb  --      Prosthetics   Prosthetic Care Comments   adjusting ply socks based on equal pressure on residual limb, ease of donning & number of clicks in sitting, depth of  patella after wt bearing    Current prosthetic wear tolerance (days/week)   daily     Current prosthetic wear tolerance (#hours/day)   4-5 hrs 2x/day    Residual limb condition   intact with no issues    Education Provided  Correct ply sock adjustment;Proper Donning;Residual limb care    Person(s) Educated  Patient    Education Method  Explanation;Demonstration;Tactile cues;Verbal cues    Education Method  Verbalized understanding;Returned demonstration;Tactile cues required;Verbal cues required;Needs further instruction    Donning Prosthesis  Supervision    Doffing Prosthesis  Supervision             PT Education - 12/08/18 0950    Education Details  ice massage    Person(s) Educated  Patient    Methods  Explanation;Demonstration;Verbal cues    Comprehension  Verbalized understanding       PT Short Term Goals - 12/01/18 1400      PT SHORT TERM GOAL #1   Title  Patient demonstrates proper donning without cues modified independent. (All updated STGs Target Date: 01/01/2019)    Time  1    Period  Months    Status  New    Target Date  01/01/19      PT SHORT TERM GOAL #2   Title  Patient tolerates prosthesis wear >12 hrs total/day with limb pain </= 2/10.     Time  1    Period  Months    Status  New    Target Date  01/01/19      PT SHORT TERM GOAL #3   Title  Patient negotiates ramps & curbs with RW & prosthesis with supervision.     Time  1    Period  Months    Status  New    Target Date  01/01/19      PT SHORT TERM GOAL #4   Title  Patient ambulates 38' with cane & moderate hand hold assist.     Time  1    Period  Months    Status  New    Target Date  01/01/19  PT Long Term Goals - 12/01/18 1900      PT LONG TERM GOAL #1   Title  Patient verbalizes & demonstrates proper prosthetic care including donning / doffing independently to enable safe use of prosthesis (All LTGs Target Date: 02/26/2019)    Time  3    Period  Months    Status  On-going     Target Date  02/26/19      PT LONG TERM GOAL #2   Title  Patient tolerates prosthesis wear daily for >90% of awake hours without skin or limb pain issues to enable function throughout his day.     Time  3    Period  Months    Status  On-going    Target Date  02/26/19      PT LONG TERM GOAL #3   Title  Berg Balance >/= 45/56 to indicate lower fall risk & less dependency in standing ADLs.     Time  3    Period  Months    Status  Revised    Target Date  02/26/19      PT LONG TERM GOAL #4   Title  Patient ambulates 600' with RW (or rollator) & prosthesis outdoors on paved or grass up to 50' surfaces modified independent for community mobility.     Time  3    Period  Months    Status  Revised    Target Date  02/26/19      PT LONG TERM GOAL #5   Title  Patient negotiates ramps, curbs with walker & stairs 1 rail /cane modified independent for community access.     Time  3    Period  Months    Status  On-going    Target Date  02/26/19      Additional Long Term Goals   Additional Long Term Goals  Yes      PT LONG TERM GOAL #6   Title  Patient ambulates 58' around furniture with cane & prosthesis modified independent for household mobility.     Time  3    Period  Months    Status  New    Target Date  02/26/19            Plan - 12/08/18 1859    Clinical Impression Statement  PT educated patient on adjusting ply socks & ice massage to manage limb pain. PT instructed in proper step width & length with wt shift over prosthesis in stance so stance weight bearing directly over prosthesis.     Rehab Potential  Good    PT Frequency  2x / week    PT Duration  Other (comment)   90 days (13 weeks)   PT Treatment/Interventions  ADLs/Self Care Home Management;Canalith Repostioning;DME Instruction;Gait training;Stair training;Functional mobility training;Therapeutic activities;Therapeutic exercise;Balance training;Neuromuscular re-education;Patient/family education;Prosthetic  Training;Manual techniques;Vestibular    PT Next Visit Plan  check residual limb pain, work towards updated STGs    Consulted and Agree with Plan of Care  Patient       Patient will benefit from skilled therapeutic intervention in order to improve the following deficits and impairments:  Abnormal gait, Decreased activity tolerance, Decreased balance, Decreased endurance, Decreased knowledge of use of DME, Decreased mobility, Impaired flexibility, Decreased range of motion, Decreased strength, Decreased skin integrity, Dizziness, Postural dysfunction, Prosthetic Dependency  Visit Diagnosis: Muscle weakness (generalized)  Other abnormalities of gait and mobility  Abnormal posture  Unsteadiness on feet  Contracture of muscle, multiple sites  Problem List Patient Active Problem List   Diagnosis Date Noted  . Controlled type 2 diabetes mellitus without complication ([place]) 09/81/1914  . Bradycardia 09/01/2018  . Gangrene of left foot ([place]) 05/28/2018  . 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 Obstructive sleep apnea syndrome 05/04/2018  . Obstructive sleep apnea treated with continuous positive airway pressure (CPAP) 05/04/2018  . Type II diabetes mellitus, uncontrolled ([place]) 04/22/2018  . Atherosclerosis of native arteries of the extremities with gangrene ([place]) 04/09/2018  . Atherosclerosis of native artery of extremity ([place]) 04/09/2018  . Cellulitis in diabetic foot ([place]) 04/04/2018  . Paroxysmal atrial fibrillation ([place]) 12/05/2017  . Cardiac pacemaker in situ   . Symptomatic bradycardia   . Asystole ([place])   . 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 History of aortic valve replacement 12/02/2017  . Severe aortic stenosis   . Bilateral impacted cerumen 01/22/2016  . Essential hypertension 11/14/2015  . Peripheral vascular disease ([place]) 05/24/2014  . Chronic kidney disease 08/30/2013  . Hypothyroidism 08/30/2013   . 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 Diabetes mellitus type 2, uncomplicated ([place])   . Aortic valve stenosis 07/25/2011  . Hyperlipidemia 01/10/2011  . CAD (coronary artery disease) 01/10/2011  . Coronary arteriosclerosis 01/10/2011    Jamey Reas PT, DPT 12/09/2018, 9:02 AM  [facility] [address] [place], [name], [address] Phone: [phone]   Fax:  [phone]  Name: NAHSIR VENEZIA MRN: [id number] Date of Birth: 1941/06/02

## 2018-12-09 NOTE — Patient Outreach (Signed)
[facility]) Care Management  12/09/2018  Steven Maldonado 1941/10/21 [id number]  RN Health Coach attempted follow up outreach call to patient.  Patient was unavailable. HIPPA compliance voicemail message left with return callback number.  Plan: RN will call patient again within 30 days.  [facility] Care Management [phone]

## 2018-12-11 ENCOUNTER — Other Ambulatory Visit: Payer: Self-pay | Admitting: Cardiovascular Disease

## 2018-12-11 ENCOUNTER — Encounter: Payer: Self-pay | Admitting: Physical Therapy

## 2018-12-11 ENCOUNTER — Ambulatory Visit: Payer: Medicare Other | Admitting: Physical Therapy

## 2018-12-11 DIAGNOSIS — R293 Abnormal posture: Secondary | ICD-10-CM | POA: Diagnosis not present

## 2018-12-11 DIAGNOSIS — R2689 Other abnormalities of gait and mobility: Secondary | ICD-10-CM

## 2018-12-11 DIAGNOSIS — M6249 Contracture of muscle, multiple sites: Secondary | ICD-10-CM | POA: Diagnosis not present

## 2018-12-11 DIAGNOSIS — R2681 Unsteadiness on feet: Secondary | ICD-10-CM | POA: Diagnosis not present

## 2018-12-11 DIAGNOSIS — M6281 Muscle weakness (generalized): Secondary | ICD-10-CM | POA: Diagnosis not present

## 2018-12-11 DIAGNOSIS — Z9181 History of falling: Secondary | ICD-10-CM | POA: Diagnosis not present

## 2018-12-13 NOTE — Therapy (Signed)
[facility] [address] [place], [name], [address] Phone: [phone]   Fax:  [phone]  Physical Therapy Treatment  Patient Details  Name: Steven Maldonado MRN: [id number] Date of Birth: 19-Jun-1941 Referring Provider (PT): Mechele Claude, [place]   Encounter Date: 12/11/2018     12/11/18 1730  PT Visits / Re-Eval  Visit Number 28  Number of Visits 50  Date for PT Re-Evaluation 02/26/19  Authorization  Authorization Type Medicare & Federal BCBS  Authorization Time Period Medicare guidelines, Fed BCBS waves co-pay.  75 visit limit PT, OT & ST combined with 1 used.  Authorization - Visit Number 8  Authorization - Number of Visits 75  PT Time Calculation  PT Start Time 1320  PT Stop Time 1400  PT Time Calculation (min) 40 min  PT - End of Session  Equipment Utilized During Treatment Gait belt  Activity Tolerance Patient tolerated treatment well;No increased pain;Patient limited by fatigue  Behavior During Therapy [facility] for tasks assessed/performed    Past Medical History:  Diagnosis Date  . Chronic diastolic CHF (congestive heart failure) ([place])   . CKD (chronic kidney disease), stage III ([place])   . Constipation   . Coronary artery disease    a. Cath February 2012 in [facility], occluded RCA with collaterals  . DM type 2 (diabetes mellitus, type 2) ([place])   . Essential hypertension   . Hyperlipidemia   . Neuropathy    feet  . Pacemaker    a. symptomatic brady after TAVR s/p MDT PPM by Dr. Curt Bears 12/04/17  . Persistent atrial fibrillation   . PONV (postoperative nausea and vomiting)    after valve surgery  . PVD (peripheral vascular disease) ([place])    a. s/p R popliteal artery stenosis tx with drug-coated balloon 05/2014, followed by Dr. Fletcher Anon.  . 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  Severe aortic stenosis    a. 12/02/17: s/p TAVR  . Skin cancer   . Sleep apnea with use of continuous positive airway pressure (CPAP)    04-11-11 AHI was 32.9 and titrated to 15 cm H20, DME is [facility]  . Subclinical hypothyroidism     Past Surgical History:  Procedure Laterality Date  . ABDOMINAL ANGIOGRAM N/A 06/08/2014   Procedure: ABDOMINAL ANGIOGRAM;  Surgeon: Wellington Hampshire, MD;  Location: [facility] CATH LAB;  Service: Cardiovascular;  Laterality: N/A;  . ABDOMINAL AORTOGRAM N/A 04/09/2018   Procedure: ABDOMINAL AORTOGRAM;  Surgeon: Conrad , MD;  Location: [facility] CV LAB;  Service: Cardiovascular;  Laterality: N/A;  . AMPUTATION Left 04/17/2018   Procedure: LEFT FOOT 3RD RAY AMPUTATION;  Surgeon: Newt Minion, MD;  Location: [facility];  Service: Orthopedics;  Laterality: Left;  . AMPUTATION Left 05/28/2018   Procedure: LEFT AMPUTATION BELOW KNEE;  Surgeon: Wylene Simmer, MD;  Location: [facility];  Service: Orthopedics;  Laterality: Left;  . APPENDECTOMY  1965  . 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 CARDIAC CATHETERIZATION  12/2010  . CARDIOVERSION  07/2011  . CARDIOVERSION N/A 04/18/2014   Procedure: CARDIOVERSION;  Surgeon: Dorothy Spark, MD;  Location: [facility] ENDOSCOPY;  Service: Cardiovascular;  Laterality: N/A;  . CARDIOVERSION N/A 11/03/2015   Procedure: CARDIOVERSION;  Surgeon: Lelon Perla, MD;  Location: [facility] ENDOSCOPY;  Service: Cardiovascular;  Laterality: N/A;  . CARDIOVERSION N/A 05/08/2017   Procedure: CARDIOVERSION;  Surgeon:  Dorothy Spark, MD;  Location: [facility] ENDOSCOPY;  Service: Cardiovascular;  Laterality: N/A;  . CARDIOVERSION N/A 07/28/2017   Procedure: CARDIOVERSION;  Surgeon: Dorothy Spark, MD;  Location: [facility];  Service: Cardiovascular;  Laterality: N/A;  . LOWER EXTREMITY ANGIOGRAM N/A 06/08/2014   Procedure: LOWER EXTREMITY ANGIOGRAM;  Surgeon: Wellington Hampshire, MD;  Location: [facility] CATH LAB;  Service: Cardiovascular;  Laterality: N/A;  . LOWER EXTREMITY  ANGIOGRAPHY Left 04/09/2018   Procedure: Lower Extremity Angiography;  Surgeon: Conrad Folsom, MD;  Location: [facility] CV LAB;  Service: Cardiovascular;  Laterality: Left;  . PACEMAKER IMPLANT N/A 12/04/2017   Procedure: PACEMAKER IMPLANT;  Surgeon: Constance Haw, MD;  Location: [facility] CV LAB;  Service: Cardiovascular;  Laterality: N/A;  . PERIPHERAL VASCULAR BALLOON ANGIOPLASTY Left 04/09/2018   Procedure: PERIPHERAL VASCULAR BALLOON ANGIOPLASTY;  Surgeon: Conrad [place], MD;  Location: [facility] CV LAB;  Service: Cardiovascular;  Laterality: Left;  SFA  . POPLITEAL ARTERY ANGIOPLASTY Right 06/08/2014   Archie Endo 06/08/2014  . RIGHT/LEFT HEART CATH AND CORONARY ANGIOGRAPHY N/A 10/08/2017   Procedure: RIGHT/LEFT HEART CATH AND CORONARY ANGIOGRAPHY;  Surgeon: Burnell Blanks, MD;  Location: [facility] CV LAB;  Service: Cardiovascular;  Laterality: N/A;  . SKIN CANCER EXCISION Bilateral    "have had them cut off back of neck X 2; off left upper arm; right wrist, near right shoulder blade" (06/08/2014)  . TEE WITHOUT CARDIOVERSION N/A 12/02/2017   Procedure: TRANSESOPHAGEAL ECHOCARDIOGRAM (TEE);  Surgeon: Burnell Blanks, MD;  Location: [facility];  Service: Open Heart Surgery;  Laterality: N/A;  . TEMPORARY PACEMAKER N/A 12/04/2017   Procedure: TEMPORARY PACEMAKER;  Surgeon: Leonie Man, MD;  Location: [facility] CV LAB;  Service: Cardiovascular;  Laterality: N/A;  . TRANSCATHETER AORTIC VALVE REPLACEMENT, TRANSFEMORAL N/A 12/02/2017   Procedure: TRANSCATHETER AORTIC VALVE REPLACEMENT, TRANSFEMORAL;  Surgeon: Burnell Blanks, MD;  Location: [facility];  Service: Open Heart Surgery;  Laterality: N/A;  using Edwards Sapien 3 Transcatheter Heart Valve size 93mm    There were no vitals filed for this visit.     12/11/18 1323  Symptoms/Limitations  Subjective Having issues with fit today. Limb is more swollen today with tenderness across shin bone. Tried more socks, could  not get it on.  Pertinent History L TTA, CAD, PAF,  pacemaker, DM2, neuropathy, CKD, HTN,   Limitations Lifting;Standing;Walking;House hold activities      12/11/18 1336  Transfers  Transfers Sit to Stand;Stand to Sit  Sit to Stand 6: Modified independent (Device/Increase time);With upper extremity assist;With armrests;From chair/3-in-1  Stand to Sit 6: Modified independent (Device/Increase time);With upper extremity assist;With armrests;To chair/3-in-1  Ambulation/Gait  Ambulation/Gait Yes  Ambulation/Gait Assistance 5: Supervision  Ambulation/Gait Assistance Details cues for posture, equal step length and equal stance time.   Ambulation Distance (Feet) 250 Feet (x1, around gym with activity)  Assistive device Rolling walker;Prosthesis  Gait Pattern Step-through pattern;Decreased stride length;Decreased trunk rotation;Trunk flexed  Ambulation Surface Level;Indoor  Ramp 5: Supervision  Curb 5: Supervision  High Level Balance  High Level Balance Activities Side stepping  High Level Balance Comments in parallel bars wtih emphasis on tall posture/decr UE support for 3 laps each way  Knee/Hip Exercises: Aerobic  Other Aerobic Scifit level 2.8 for 8 minutes with goal >/= 35 rpm for strengthening and activity tolearnce  Prosthetics  Prosthetic Care Comments  continued to work on correct sock ply adjustment throughout session.   Current prosthetic wear tolerance (days/week)  daily  Current prosthetic wear tolerance (#hours/day)  4-5 hrs 2x/day  Residual limb condition  intact with no issues  Donning Prosthesis 5  Doffing Prosthesis 5        PT Short Term Goals - 12/01/18 1400      PT SHORT TERM GOAL #1   Title  Patient demonstrates proper donning without cues modified independent. (All updated STGs Target Date: 01/01/2019)    Time  1    Period  Months    Status  New    Target Date  01/01/19      PT SHORT TERM GOAL #2   Title  Patient tolerates prosthesis wear >12 hrs  total/day with limb pain </= 2/10.     Time  1    Period  Months    Status  New    Target Date  01/01/19      PT SHORT TERM GOAL #3   Title  Patient negotiates ramps & curbs with RW & prosthesis with supervision.     Time  1    Period  Months    Status  New    Target Date  01/01/19      PT SHORT TERM GOAL #4   Title  Patient ambulates 62' with cane & moderate hand hold assist.     Time  1    Period  Months    Status  New    Target Date  01/01/19        PT Long Term Goals - 12/01/18 1900      PT LONG TERM GOAL #1   Title  Patient verbalizes & demonstrates proper prosthetic care including donning / doffing independently to enable safe use of prosthesis (All LTGs Target Date: 02/26/2019)    Time  3    Period  Months    Status  On-going    Target Date  02/26/19      PT LONG TERM GOAL #2   Title  Patient tolerates prosthesis wear daily for >90% of awake hours without skin or limb pain issues to enable function throughout his day.     Time  3    Period  Months    Status  On-going    Target Date  02/26/19      PT LONG TERM GOAL #3   Title  Berg Balance >/= 45/56 to indicate lower fall risk & less dependency in standing ADLs.     Time  3    Period  Months    Status  Revised    Target Date  02/26/19      PT LONG TERM GOAL #4   Title  Patient ambulates 600' with RW (or rollator) & prosthesis outdoors on paved or grass up to 50' surfaces modified independent for community mobility.     Time  3    Period  Months    Status  Revised    Target Date  02/26/19      PT LONG TERM GOAL #5   Title  Patient negotiates ramps, curbs with walker & stairs 1 rail /cane modified independent for community access.     Time  3    Period  Months    Status  On-going    Target Date  02/26/19      Additional Long Term Goals   Additional Long Term Goals  Yes      PT LONG TERM GOAL #6   Title  Patient ambulates 18' around furniture with cane & prosthesis modified  independent for household  mobility.     Time  3    Period  Months    Status  New    Target Date  02/26/19         12/11/18 1730  Plan  Clinical Impression Statement Today's skilled session continued to work on gait, balance and strengthening with mild incr in limb pain that was relieved with seated rest breaks throughout session. The pt is making slow, steady progress toward goals and should benefit from continued PT to progress toward unmet goals.  Pt will benefit from skilled therapeutic intervention in order to improve on the following deficits Abnormal gait;Decreased activity tolerance;Decreased balance;Decreased endurance;Decreased knowledge of use of DME;Decreased mobility;Impaired flexibility;Decreased range of motion;Decreased strength;Decreased skin integrity;Dizziness;Postural dysfunction;Prosthetic Dependency  Rehab Potential Good  PT Frequency 2x / week  PT Duration Other (comment) (90 days (13 weeks))  PT Treatment/Interventions ADLs/Self Care Home Management;Canalith Repostioning;DME Instruction;Gait training;Stair training;Functional mobility training;Therapeutic activities;Therapeutic exercise;Balance training;Neuromuscular re-education;Patient/family education;Prosthetic Training;Manual techniques;Vestibular  PT Next Visit Plan check residual limb pain, work towards updated STGs  Consulted and Agree with Plan of Care Patient      Patient will benefit from skilled therapeutic intervention in order to improve the following deficits and impairments:     Visit Diagnosis: Muscle weakness (generalized)  Other abnormalities of gait and mobility  Abnormal posture  Unsteadiness on feet     Problem List Patient Active Problem List   Diagnosis Date Noted  . Controlled type 2 diabetes mellitus without complication ([place]) 26/37/8588  . Bradycardia 09/01/2018  . Gangrene of left foot ([place]) 05/28/2018  . 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 Obstructive  sleep apnea syndrome 05/04/2018  . Obstructive sleep apnea treated with continuous positive airway pressure (CPAP) 05/04/2018  . Type II diabetes mellitus, uncontrolled ([place]) 04/22/2018  . Atherosclerosis of native arteries of the extremities with gangrene ([place]) 04/09/2018  . Atherosclerosis of native artery of extremity ([place]) 04/09/2018  . Cellulitis in diabetic foot ([place]) 04/04/2018  . Paroxysmal atrial fibrillation ([place]) 12/05/2017  . Cardiac pacemaker in situ   . Symptomatic bradycardia   . Asystole ([place])   . 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 History of aortic valve replacement 12/02/2017  . Severe aortic stenosis   . Bilateral impacted cerumen 01/22/2016  . Essential hypertension 11/14/2015  . Peripheral vascular disease ([place]) 05/24/2014  . Chronic kidney disease 08/30/2013  . Hypothyroidism 08/30/2013  . 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 Diabetes mellitus type 2, uncomplicated ([place])   . Aortic valve stenosis 07/25/2011  . Hyperlipidemia 01/10/2011  . CAD (coronary artery disease) 01/10/2011  . Coronary arteriosclerosis 01/10/2011    Willow Ora, PTA, [facility] [address], [place], [place] [address] [phone] 12/13/18, 2:59 PM   Name: Steven Maldonado MRN: [id number] Date of Birth: 26-Jun-1941

## 2018-12-14 ENCOUNTER — Other Ambulatory Visit: Payer: Self-pay | Admitting: *Deleted

## 2018-12-14 ENCOUNTER — Encounter: Payer: Self-pay | Admitting: Thoracic Surgery (Cardiothoracic Vascular Surgery)

## 2018-12-15 ENCOUNTER — Telehealth: Payer: Self-pay | Admitting: Endocrinology

## 2018-12-15 ENCOUNTER — Ambulatory Visit: Payer: Medicare Other | Attending: Student | Admitting: Physical Therapy

## 2018-12-15 ENCOUNTER — Encounter: Payer: Self-pay | Admitting: Physical Therapy

## 2018-12-15 DIAGNOSIS — M6281 Muscle weakness (generalized): Secondary | ICD-10-CM

## 2018-12-15 DIAGNOSIS — R2689 Other abnormalities of gait and mobility: Secondary | ICD-10-CM | POA: Diagnosis not present

## 2018-12-15 DIAGNOSIS — M6249 Contracture of muscle, multiple sites: Secondary | ICD-10-CM | POA: Diagnosis not present

## 2018-12-15 DIAGNOSIS — R293 Abnormal posture: Secondary | ICD-10-CM

## 2018-12-15 DIAGNOSIS — R2681 Unsteadiness on feet: Secondary | ICD-10-CM | POA: Diagnosis not present

## 2018-12-15 NOTE — Telephone Encounter (Signed)
[employer] ph# [phone] called re: they sent a request on 11/30/2017, 12/04/17&12/10/17. They are requesting most recent office visit notes. Fax# [phone].

## 2018-12-16 ENCOUNTER — Ambulatory Visit
Admission: RE | Admit: 2018-12-16 | Discharge: 2018-12-16 | Disposition: A | Discharge status: Home / Self Care | Payer: Medicare Other | Source: Ambulatory Visit | Attending: Nephrology | Admitting: Nephrology

## 2018-12-16 DIAGNOSIS — N183 Chronic kidney disease, stage 3 unspecified: Secondary | ICD-10-CM

## 2018-12-16 NOTE — Telephone Encounter (Signed)
Paperwork faxed °

## 2018-12-16 NOTE — Therapy (Signed)
[facility] [address] [place], [name], [address] Phone: [phone]   Fax:  [phone]  Physical Therapy Treatment  Patient Details  Name: Steven Maldonado MRN: [id number] Date of Birth: Jan 03, 1941 Referring Provider (PT): Mechele Claude, [place]   Encounter Date: 12/15/2018  PT End of Session - 12/15/18 1400    Visit Number  29    Number of Visits  50    Date for PT Re-Evaluation  02/26/19    Authorization Type  Medicare & Federal BCBS    Authorization Time Period  Medicare guidelines, Fed BCBS waves co-pay.  75 visit limit PT, OT & ST combined with 1 used.    Authorization - Visit Number  9    Authorization - Number of Visits  75    PT Start Time  6010    PT Stop Time  1400    PT Time Calculation (min)  45 min    Equipment Utilized During Treatment  Gait belt    Activity Tolerance  Patient tolerated treatment well;No increased pain;Patient limited by fatigue    Behavior During Therapy  [facility] for tasks assessed/performed       Past Medical History:  Diagnosis Date  . Chronic diastolic CHF (congestive heart failure) ([place])   . CKD (chronic kidney disease), stage III ([place])   . Constipation   . Coronary artery disease    a. Cath February 2012 in [place], occluded RCA with collaterals  . DM type 2 (diabetes mellitus, type 2) ([place])   . Essential hypertension   . Hyperlipidemia   . Neuropathy    feet  . Pacemaker    a. symptomatic brady after TAVR s/p MDT PPM by Dr. Curt Bears 12/04/17  . Persistent atrial fibrillation   . PONV (postoperative nausea and vomiting)    after valve surgery  . PVD (peripheral vascular disease) ([place])    a. s/p R popliteal artery stenosis tx with drug-coated balloon 05/2014, followed by Dr. Fletcher Anon.  . 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 Severe aortic stenosis    a.  12/02/17: s/p TAVR  . Skin cancer   . Sleep apnea with use of continuous positive airway pressure (CPAP)    04-11-11 AHI was 32.9 and titrated to 15 cm H20, DME is [facility]  . Subclinical hypothyroidism     Past Surgical History:  Procedure Laterality Date  . ABDOMINAL ANGIOGRAM N/A 06/08/2014   Procedure: ABDOMINAL ANGIOGRAM;  Surgeon: Wellington Hampshire, MD;  Location: [facility] CATH LAB;  Service: Cardiovascular;  Laterality: N/A;  . ABDOMINAL AORTOGRAM N/A 04/09/2018   Procedure: ABDOMINAL AORTOGRAM;  Surgeon: Conrad Galveston, MD;  Location: [place] CV LAB;  Service: Cardiovascular;  Laterality: N/A;  . AMPUTATION Left 04/17/2018   Procedure: LEFT FOOT 3RD RAY AMPUTATION;  Surgeon: Newt Minion, MD;  Location: [place];  Service: Orthopedics;  Laterality: Left;  . AMPUTATION Left 05/28/2018   Procedure: LEFT AMPUTATION BELOW KNEE;  Surgeon: Wylene Simmer, MD;  Location: [place];  Service: Orthopedics;  Laterality: Left;  . APPENDECTOMY  1965  . 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 CARDIAC CATHETERIZATION  12/2010  . CARDIOVERSION  07/2011  . CARDIOVERSION N/A 04/18/2014   Procedure: CARDIOVERSION;  Surgeon: Dorothy Spark, MD;  Location: [facility] ENDOSCOPY;  Service: Cardiovascular;  Laterality: N/A;  . CARDIOVERSION N/A 11/03/2015   Procedure: CARDIOVERSION;  Surgeon: Lelon Perla, MD;  Location: [facility] ENDOSCOPY;  Service: Cardiovascular;  Laterality: N/A;  . CARDIOVERSION N/A 05/08/2017   Procedure: CARDIOVERSION;  Surgeon: Dorothy Spark, MD;  Location: [facility] ENDOSCOPY;  Service: Cardiovascular;  Laterality: N/A;  . CARDIOVERSION N/A 07/28/2017   Procedure: CARDIOVERSION;  Surgeon: Dorothy Spark, MD;  Location: [place];  Service: Cardiovascular;  Laterality: N/A;  . LOWER EXTREMITY ANGIOGRAM N/A 06/08/2014   Procedure: LOWER EXTREMITY ANGIOGRAM;  Surgeon: Wellington Hampshire, MD;  Location: [facility] CATH LAB;  Service: Cardiovascular;  Laterality: N/A;  . LOWER EXTREMITY ANGIOGRAPHY Left 04/09/2018    Procedure: Lower Extremity Angiography;  Surgeon: Conrad Smoketown, MD;  Location: [place] CV LAB;  Service: Cardiovascular;  Laterality: Left;  . PACEMAKER IMPLANT N/A 12/04/2017   Procedure: PACEMAKER IMPLANT;  Surgeon: Constance Haw, MD;  Location: [place] CV LAB;  Service: Cardiovascular;  Laterality: N/A;  . PERIPHERAL VASCULAR BALLOON ANGIOPLASTY Left 04/09/2018   Procedure: PERIPHERAL VASCULAR BALLOON ANGIOPLASTY;  Surgeon: Conrad Center Line, MD;  Location: [place] CV LAB;  Service: Cardiovascular;  Laterality: Left;  SFA  . POPLITEAL ARTERY ANGIOPLASTY Right 06/08/2014   Archie Endo 06/08/2014  . RIGHT/LEFT HEART CATH AND CORONARY ANGIOGRAPHY N/A 10/08/2017   Procedure: RIGHT/LEFT HEART CATH AND CORONARY ANGIOGRAPHY;  Surgeon: Burnell Blanks, MD;  Location: [place] CV LAB;  Service: Cardiovascular;  Laterality: N/A;  . SKIN CANCER EXCISION Bilateral    "have had them cut off back of neck X 2; off left upper arm; right wrist, near right shoulder blade" (06/08/2014)  . TEE WITHOUT CARDIOVERSION N/A 12/02/2017   Procedure: TRANSESOPHAGEAL ECHOCARDIOGRAM (TEE);  Surgeon: Burnell Blanks, MD;  Location: [place];  Service: Open Heart Surgery;  Laterality: N/A;  . TEMPORARY PACEMAKER N/A 12/04/2017   Procedure: TEMPORARY PACEMAKER;  Surgeon: Leonie Man, MD;  Location: [place] CV LAB;  Service: Cardiovascular;  Laterality: N/A;  . TRANSCATHETER AORTIC VALVE REPLACEMENT, TRANSFEMORAL N/A 12/02/2017   Procedure: TRANSCATHETER AORTIC VALVE REPLACEMENT, TRANSFEMORAL;  Surgeon: Burnell Blanks, MD;  Location: [place];  Service: Open Heart Surgery;  Laterality: N/A;  using Edwards Sapien 3 Transcatheter Heart Valve size 72mm    There were no vitals filed for this visit.  Subjective Assessment - 12/15/18 1315    Subjective  He has been able to wear prosthesis. He thinks its pressure more than pain now.     Pertinent History  L TTA, CAD, PAF,  pacemaker, DM2,  neuropathy, CKD, HTN,     Limitations  Lifting;Standing;Walking;House hold activities    Patient Stated Goals  To use prosthesis in community, travel (uses Lucianne Lei), take of himself so wife does not have to do it. Housework.     Currently in Pain?  No/denies                       [facility] Adult PT Treatment/Exercise - 12/15/18 1315      Transfers   Transfers  Sit to Stand;Stand to Sit    Sit to Stand  5: Supervision;6: Modified independent (Device/Increase time);With upper extremity assist;With armrests;From chair/3-in-1   supervision to stabilize with cane   Stand to Sit  5: Supervision;6: Modified independent (Device/Increase time);With upper extremity assist;With armrests;To chair/3-in-1   supervision with cane for stability     Ambulation/Gait   Ambulation/Gait  Yes    Ambulation/Gait Assistance  4: Min assist;5: Supervision   supervision Rollator walker & MinA cane   Ambulation/Gait Assistance Details  Demo & verbal cues on rollator use & safety. Tactile / manual & verbal cues for balance, wt shift & posture with cane.     Ambulation Distance (Feet)  250 Feet   250' with rollator & 57' with cane   Assistive device  Rolling walker;Prosthesis;Rollator;Straight cane    Gait Pattern  Step-through pattern;Decreased stride length;Decreased trunk rotation;Trunk flexed    Ambulation Surface  Level;Indoor    Ramp  5: Supervision   rollator walker & prosthesis   Ramp Details (indicate cue type and reason)  demo & verbal cues on technique with rollator walker & prosthesis    Curb  5: Supervision   rollator walker & prosthesis   Curb Details (indicate cue type and reason)  demo & verbal cues on technique with rollator walker & prosthesis      High Level Balance   High Level Balance Activities  --    High Level Balance Comments  --      Knee/Hip Exercises: Aerobic   Other Aerobic  --      Prosthetics   Prosthetic Care Comments   use of cutoff sock under proximal liner to  decrease friction.  Positioning LEs sitting to avoid rotation & proximal medial pressure    Current prosthetic wear tolerance (days/week)   daily     Current prosthetic wear tolerance (#hours/day)   5 hrs 2x/day    Residual limb condition   skin slothing proximal medial knee    Education Provided  Skin check;Residual limb care;Proper wear schedule/adjustment;Other (comment)   see prosthetic care comments   Person(s) Educated  Patient    Education Method  Explanation;Verbal cues;Demonstration    Education Method  Verbalized understanding;Verbal cues required;Needs further instruction    Donning Prosthesis  Supervision    Doffing Prosthesis  Modified independent (device/increased time)               PT Short Term Goals - 12/01/18 1400      PT SHORT TERM GOAL #1   Title  Patient demonstrates proper donning without cues modified independent. (All updated STGs Target Date: 01/01/2019)    Time  1    Period  Months    Status  New    Target Date  01/01/19      PT SHORT TERM GOAL #2   Title  Patient tolerates prosthesis wear >12 hrs total/day with limb pain </= 2/10.     Time  1    Period  Months    Status  New    Target Date  01/01/19      PT SHORT TERM GOAL #3   Title  Patient negotiates ramps & curbs with RW & prosthesis with supervision.     Time  1    Period  Months    Status  New    Target Date  01/01/19      PT SHORT TERM GOAL #4   Title  Patient ambulates 82' with cane & moderate hand hold assist.     Time  1    Period  Months    Status  New    Target Date  01/01/19        PT Long Term Goals - 12/01/18 1900      PT LONG TERM GOAL #1   Title  Patient verbalizes & demonstrates proper prosthetic care including donning / doffing independently to enable safe use of prosthesis (All LTGs Target Date: 02/26/2019)    Time  3    Period  Months    Status  On-going    Target Date  02/26/19      PT LONG TERM GOAL #2   Title  Patient tolerates prosthesis wear daily for  >90% of awake hours without skin or limb pain issues to enable function throughout his day.     Time  3    Period  Months    Status  On-going    Target Date  02/26/19      PT LONG TERM GOAL #3   Title  Berg Balance >/= 45/56 to indicate lower fall risk & less dependency in standing ADLs.     Time  3    Period  Months    Status  Revised    Target Date  02/26/19      PT LONG TERM GOAL #4   Title  Patient ambulates 600' with RW (or rollator) & prosthesis outdoors on paved or grass up to 50' surfaces modified independent for community mobility.     Time  3    Period  Months    Status  Revised    Target Date  02/26/19      PT LONG TERM GOAL #5   Title  Patient negotiates ramps, curbs with walker & stairs 1 rail /cane modified independent for community access.     Time  3    Period  Months    Status  On-going    Target Date  02/26/19      Additional Long Term Goals   Additional Long Term Goals  Yes      PT LONG TERM GOAL #6   Title  Patient ambulates 67' around furniture with cane & prosthesis modified independent for household mobility.     Time  3    Period  Months    Status  New    Target Date  02/26/19            Plan - 12/15/18 1800    Clinical Impression Statement  PT instructed patient in use of rollator walker & prosthesis to enable seat for rest / relief limb pain with distance and improved fluency. He appears to understands basically how to use rollator walker including ramps & curbs but needs additional review.     Rehab Potential  Good    PT Frequency  2x / week    PT Duration  Other (comment)   90 days (13 weeks)   PT Treatment/Interventions  ADLs/Self Care Home Management;Canalith Repostioning;DME Instruction;Gait training;Stair training;Functional mobility training;Therapeutic activities;Therapeutic exercise;Balance training;Neuromuscular re-education;Patient/family education;Prosthetic Training;Manual techniques;Vestibular    PT Next Visit Plan  check  residual limb pain, review rollator use including ramps & curbs, work towards updated STGs    Consulted and Agree with Plan of Care  Patient       Patient will benefit from skilled therapeutic intervention in order to improve the following deficits and impairments:  Abnormal gait, Decreased activity tolerance, Decreased balance, Decreased endurance, Decreased knowledge of use of DME, Decreased mobility, Impaired flexibility, Decreased range of motion, Decreased strength, Decreased skin integrity, Dizziness, Postural dysfunction, Prosthetic Dependency  Visit Diagnosis: Muscle weakness (generalized)  Other abnormalities of gait and mobility  Abnormal posture  Unsteadiness on feet  Contracture of muscle, multiple sites     Problem List Patient Active Problem List   Diagnosis Date Noted  . Controlled type 2 diabetes mellitus without complication ([place]) 79/12/4095  . Bradycardia 09/01/2018  . Gangrene of left foot ([place]) 05/28/2018  . 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 Obstructive sleep apnea syndrome 05/04/2018  . Obstructive sleep apnea treated with continuous positive airway pressure (CPAP) 05/04/2018  . Type II diabetes mellitus, uncontrolled ([place]) 04/22/2018  . Atherosclerosis of native arteries of the extremities with gangrene ([place]) 04/09/2018  . Atherosclerosis of native artery of extremity ([place]) 04/09/2018  . Cellulitis in diabetic foot ([place]) 04/04/2018  . Paroxysmal atrial fibrillation ([place]) 12/05/2017  . Cardiac pacemaker in situ   . Symptomatic bradycardia   . Asystole ([place])   . 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 History of aortic valve replacement 12/02/2017  . Severe aortic stenosis   . Bilateral impacted cerumen 01/22/2016  . Essential hypertension 11/14/2015  . Peripheral vascular disease ([place]) 05/24/2014  . Chronic kidney disease 08/30/2013  . Hypothyroidism 08/30/2013  . Obesity with body  mass index greater than 30 07/15/2013  . Diabetes mellitus type 2, uncomplicated ([place])   . Aortic valve stenosis 07/25/2011  . Hyperlipidemia 01/10/2011  . CAD (coronary artery disease) 01/10/2011  . Coronary arteriosclerosis 01/10/2011    Jamey Reas PT, DPT 12/16/2018, 6:56 AM  [facility] [address] [place], [name], [address] Phone: [phone]   Fax:  [phone]  Name: Steven Maldonado MRN: [id number] Date of Birth: 05-29-1941

## 2018-12-22 ENCOUNTER — Encounter: Payer: Self-pay | Admitting: Rehabilitation

## 2018-12-22 ENCOUNTER — Ambulatory Visit: Payer: Medicare Other | Admitting: Rehabilitation

## 2018-12-22 DIAGNOSIS — R2689 Other abnormalities of gait and mobility: Secondary | ICD-10-CM

## 2018-12-22 DIAGNOSIS — R293 Abnormal posture: Secondary | ICD-10-CM | POA: Diagnosis not present

## 2018-12-22 DIAGNOSIS — M6249 Contracture of muscle, multiple sites: Secondary | ICD-10-CM | POA: Diagnosis not present

## 2018-12-22 DIAGNOSIS — M6281 Muscle weakness (generalized): Secondary | ICD-10-CM

## 2018-12-22 DIAGNOSIS — R2681 Unsteadiness on feet: Secondary | ICD-10-CM

## 2018-12-22 NOTE — Therapy (Signed)
[facility] [address] [place], [name], [address] Phone: [phone]   Fax:  [phone]  Physical Therapy Treatment and Progress Note   Patient Details  Name: Steven Maldonado MRN: [id number] Date of Birth: 1941-06-25 Referring Provider (PT): Mechele Claude, [place]   Encounter Date: 12/22/2018  PT End of Session - 12/22/18 1424    Visit Number  30    Number of Visits  50    Date for PT Re-Evaluation  02/26/19    Authorization Type  Medicare & Federal BCBS    Authorization Time Period  Medicare guidelines, Fed BCBS waves co-pay.  75 visit limit PT, OT & ST combined with 1 used.    Authorization - Visit Number  10    Authorization - Number of Visits  75    PT Start Time  6568    PT Stop Time  1402    PT Time Calculation (min)  45 min    Equipment Utilized During Treatment  Gait belt    Activity Tolerance  Patient tolerated treatment well;No increased pain;Patient limited by fatigue    Behavior During Therapy  [facility] for tasks assessed/performed       Past Medical History:  Diagnosis Date  . Chronic diastolic CHF (congestive heart failure) ([place])   . CKD (chronic kidney disease), stage III ([place])   . Constipation   . Coronary artery disease    a. Cath February 2012 in [facility], occluded RCA with collaterals  . DM type 2 (diabetes mellitus, type 2) ([place])   . Essential hypertension   . Hyperlipidemia   . Neuropathy    feet  . Pacemaker    a. symptomatic brady after TAVR s/p MDT PPM by Dr. Curt Bears 12/04/17  . Persistent atrial fibrillation   . PONV (postoperative nausea and vomiting)    after valve surgery  . PVD (peripheral vascular disease) ([place])    a. s/p R popliteal artery stenosis tx with drug-coated balloon 05/2014, followed by Dr. Fletcher Anon.  . 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 Severe  aortic stenosis    a. 12/02/17: s/p TAVR  . Skin cancer   . Sleep apnea with use of continuous positive airway pressure (CPAP)    04-11-11 AHI was 32.9 and titrated to 15 cm H20, DME is [facility]  . Subclinical hypothyroidism     Past Surgical History:  Procedure Laterality Date  . ABDOMINAL ANGIOGRAM N/A 06/08/2014   Procedure: ABDOMINAL ANGIOGRAM;  Surgeon: Wellington Hampshire, MD;  Location: [facility] CATH LAB;  Service: Cardiovascular;  Laterality: N/A;  . ABDOMINAL AORTOGRAM N/A 04/09/2018   Procedure: ABDOMINAL AORTOGRAM;  Surgeon: Conrad Cedar Fort, MD;  Location: [facility] [facility] CV LAB;  Service: Cardiovascular;  Laterality: N/A;  . AMPUTATION Left 04/17/2018   Procedure: LEFT FOOT 3RD RAY AMPUTATION;  Surgeon: Newt Minion, MD;  Location: [facility];  Service: Orthopedics;  Laterality: Left;  . AMPUTATION Left 05/28/2018   Procedure: LEFT AMPUTATION BELOW KNEE;  Surgeon: Wylene Simmer, MD;  Location: [facility];  Service: Orthopedics;  Laterality: Left;  . APPENDECTOMY  1965  . 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 CARDIAC CATHETERIZATION  12/2010  . CARDIOVERSION  07/2011  . CARDIOVERSION N/A 04/18/2014   Procedure: CARDIOVERSION;  Surgeon: Dorothy Spark, MD;  Location: [facility] ENDOSCOPY;  Service: Cardiovascular;  Laterality: N/A;  . CARDIOVERSION N/A 11/03/2015  Procedure: CARDIOVERSION;  Surgeon: Lelon Perla, MD;  Location: [facility] ENDOSCOPY;  Service: Cardiovascular;  Laterality: N/A;  . CARDIOVERSION N/A 05/08/2017   Procedure: CARDIOVERSION;  Surgeon: Dorothy Spark, MD;  Location: [facility] ENDOSCOPY;  Service: Cardiovascular;  Laterality: N/A;  . CARDIOVERSION N/A 07/28/2017   Procedure: CARDIOVERSION;  Surgeon: Dorothy Spark, MD;  Location: [facility];  Service: Cardiovascular;  Laterality: N/A;  . LOWER EXTREMITY ANGIOGRAM N/A 06/08/2014   Procedure: LOWER EXTREMITY ANGIOGRAM;  Surgeon: Wellington Hampshire, MD;  Location: [facility] CATH LAB;  Service: Cardiovascular;  Laterality: N/A;  . LOWER EXTREMITY  ANGIOGRAPHY Left 04/09/2018   Procedure: Lower Extremity Angiography;  Surgeon: Conrad Ravenwood, MD;  Location: [facility] CV LAB;  Service: Cardiovascular;  Laterality: Left;  . PACEMAKER IMPLANT N/A 12/04/2017   Procedure: PACEMAKER IMPLANT;  Surgeon: Constance Haw, MD;  Location: [facility] CV LAB;  Service: Cardiovascular;  Laterality: N/A;  . PERIPHERAL VASCULAR BALLOON ANGIOPLASTY Left 04/09/2018   Procedure: PERIPHERAL VASCULAR BALLOON ANGIOPLASTY;  Surgeon: Conrad Stanwood, MD;  Location: [facility] CV LAB;  Service: Cardiovascular;  Laterality: Left;  SFA  . POPLITEAL ARTERY ANGIOPLASTY Right 06/08/2014   Archie Endo 06/08/2014  . RIGHT/LEFT HEART CATH AND CORONARY ANGIOGRAPHY N/A 10/08/2017   Procedure: RIGHT/LEFT HEART CATH AND CORONARY ANGIOGRAPHY;  Surgeon: Burnell Blanks, MD;  Location: [facility] CV LAB;  Service: Cardiovascular;  Laterality: N/A;  . SKIN CANCER EXCISION Bilateral    "have had them cut off back of neck X 2; off left upper arm; right wrist, near right shoulder blade" (06/08/2014)  . TEE WITHOUT CARDIOVERSION N/A 12/02/2017   Procedure: TRANSESOPHAGEAL ECHOCARDIOGRAM (TEE);  Surgeon: Burnell Blanks, MD;  Location: [facility];  Service: Open Heart Surgery;  Laterality: N/A;  . TEMPORARY PACEMAKER N/A 12/04/2017   Procedure: TEMPORARY PACEMAKER;  Surgeon: Leonie Man, MD;  Location: [facility] CV LAB;  Service: Cardiovascular;  Laterality: N/A;  . TRANSCATHETER AORTIC VALVE REPLACEMENT, TRANSFEMORAL N/A 12/02/2017   Procedure: TRANSCATHETER AORTIC VALVE REPLACEMENT, TRANSFEMORAL;  Surgeon: Burnell Blanks, MD;  Location: [facility];  Service: Open Heart Surgery;  Laterality: N/A;  using Edwards Sapien 3 Transcatheter Heart Valve size 63mm    There were no vitals filed for this visit.  Subjective Assessment - 12/22/18 1324    Subjective  Pt reports he has blister on left leg.  Upon assessment, note that blister was open, he had half sock over this area  (so below knee).      Pertinent History  L TTA, CAD, PAF,  pacemaker, DM2, neuropathy, CKD, HTN,     Limitations  Lifting;Standing;Walking;House hold activities    Patient Stated Goals  To use prosthesis in community, travel (uses [employer]i), take of himself so wife does not have to do it. Housework.     Currently in Pain?  Yes    Pain Score  2     Pain Location  Leg    Pain Orientation  Left;Lateral    Pain Descriptors / Indicators  Tender    Pain Type  Acute pain    Pain Onset  In the past 7 days    Pain Frequency  Intermittent    Aggravating Factors   weight bearing    Pain Relieving Factors  removing prosthesis                       [facility] Adult PT Treatment/Exercise - 12/22/18 1320      Ambulation/Gait  Ambulation/Gait  Yes    Ambulation/Gait Assistance  5: Supervision;4: Min guard    Ambulation/Gait Assistance Details  Trialed varying sock ply combinations during session as pt was reporting new blister and tenderness in that area.  He presents with 4 ply (plus single half sock) and reports increased pressure.  Therefore had him don 5 ply.  Pt reports improvement but still pressure with many clicks when donning prosthesis.  Finally changed to 6 ply and note marked improvement in pressure.  He does report pressure on the inside calf.  Upon further assessment, his foot is noted to be internally rotated/set.  Recommended that he contact Gerald Stabs from [employer] and have him adjust foot to be more toed out.   Pt verbalized understanding.     Ambulation Distance (Feet)  115 Feet   20, 25, 50   Assistive device  Rolling walker;Prosthesis;Rollator;Straight cane    Gait Pattern  Step-through pattern;Decreased stride length;Decreased trunk rotation;Trunk flexed    Gait Comments  Provided tactile and verbal cues for improved forward weight shift over prosthesis during stance.  Pt tends to keep hips retracted and relies heavily on UE support when using RW.  He reports tenderness when fully  weight bearing.        Prosthetics   Prosthetic Care Comments   Provided education on proper placement of cutoff sock to reduce friction.  Educated on use of tegaderm on open blister on lateral portion of residual limb and to remove at night when donning shrinker.  Pt had reported he did not wear the leg for a couple of days due to blister and did not wear shrinker so leg was swollen so unable to get on at first.  Continued to educate on importance of shrinker sock when not in prosthesis.  Educated how abd/add leg with gait can cause pressure on inside/outside of leg and also how foot being toed in/out can effect pressure.      Current prosthetic wear tolerance (days/week)   daily     Current prosthetic wear tolerance (#hours/day)   5 hrs 2x/day    Current prosthetic weight-bearing tolerance (hours/day)   Patient is ambulating throughout house several times a day.    Residual limb condition   Area of opened blister and minimal bleeding on lateral portion of limb, just lateral/inferior to patella.      Education Provided  Skin check;Residual limb care;Correct ply sock adjustment;Proper wear schedule/adjustment;Proper weight-bearing schedule/adjustment    Person(s) Educated  Patient    Education Method  Explanation;Demonstration;Verbal cues    Education Method  Verbalized understanding;Returned demonstration    Donning Prosthesis  Supervision    Doffing Prosthesis  Modified independent (device/increased time)             PT Education - 12/22/18 1424    Education Details  see prosthetic care     Person(s) Educated  Patient    Methods  Explanation    Comprehension  Verbalized understanding       PT Short Term Goals - 12/01/18 1400      PT SHORT TERM GOAL #1   Title  Patient demonstrates proper donning without cues modified independent. (All updated STGs Target Date: 01/01/2019)    Time  1    Period  Months    Status  New    Target Date  01/01/19      PT SHORT TERM GOAL #2   Title   Patient tolerates prosthesis wear >12 hrs total/day with limb pain </= 2/10.  Time  1    Period  Months    Status  New    Target Date  01/01/19      PT SHORT TERM GOAL #3   Title  Patient negotiates ramps & curbs with RW & prosthesis with supervision.     Time  1    Period  Months    Status  New    Target Date  01/01/19      PT SHORT TERM GOAL #4   Title  Patient ambulates 75' with cane & moderate hand hold assist.     Time  1    Period  Months    Status  New    Target Date  01/01/19        PT Long Term Goals - 12/01/18 1900      PT LONG TERM GOAL #1   Title  Patient verbalizes & demonstrates proper prosthetic care including donning / doffing independently to enable safe use of prosthesis (All LTGs Target Date: 02/26/2019)    Time  3    Period  Months    Status  On-going    Target Date  02/26/19      PT LONG TERM GOAL #2   Title  Patient tolerates prosthesis wear daily for >90% of awake hours without skin or limb pain issues to enable function throughout his day.     Time  3    Period  Months    Status  On-going    Target Date  02/26/19      PT LONG TERM GOAL #3   Title  Berg Balance >/= 45/56 to indicate lower fall risk & less dependency in standing ADLs.     Time  3    Period  Months    Status  Revised    Target Date  02/26/19      PT LONG TERM GOAL #4   Title  Patient ambulates 600' with RW (or rollator) & prosthesis outdoors on paved or grass up to 50' surfaces modified independent for community mobility.     Time  3    Period  Months    Status  Revised    Target Date  02/26/19      PT LONG TERM GOAL #5   Title  Patient negotiates ramps, curbs with walker & stairs 1 rail /cane modified independent for community access.     Time  3    Period  Months    Status  On-going    Target Date  02/26/19      Additional Long Term Goals   Additional Long Term Goals  Yes      PT LONG TERM GOAL #6   Title  Patient ambulates 65' around furniture with cane &  prosthesis modified independent for household mobility.     Time  3    Period  Months    Status  New    Target Date  02/26/19        Progress Note Reporting Period 11/13/18 to 12/22/18  See note below for Objective Data and Assessment of Progress/Goals.         Plan - 12/22/18 1424    Clinical Impression Statement  Skilled session focused on proper care of new open blister on residual limb, proper ply sock adjustment, importance of wearing shrinker when not in prosthesis, and calling [employer] for foot adjustment.  Pt verbalized understanding.     Rehab Potential  Good    PT Frequency  2x / week    PT Duration  Other (comment)   90 days (13 weeks)   PT Treatment/Interventions  ADLs/Self Care Home Management;Canalith Repostioning;DME Instruction;Gait training;Stair training;Functional mobility training;Therapeutic activities;Therapeutic exercise;Balance training;Neuromuscular re-education;Patient/family education;Prosthetic Training;Manual techniques;Vestibular    PT Next Visit Plan  check residual limb pain, review rollator use including ramps & curbs, work towards updated STGs    Consulted and Agree with Plan of Care  Patient       Patient will benefit from skilled therapeutic intervention in order to improve the following deficits and impairments:  Abnormal gait, Decreased activity tolerance, Decreased balance, Decreased endurance, Decreased knowledge of use of DME, Decreased mobility, Impaired flexibility, Decreased range of motion, Decreased strength, Decreased skin integrity, Dizziness, Postural dysfunction, Prosthetic Dependency  Visit Diagnosis: Muscle weakness (generalized)  Other abnormalities of gait and mobility  Abnormal posture  Unsteadiness on feet     Problem List Patient Active Problem List   Diagnosis Date Noted  . Controlled type 2 diabetes mellitus without complication ([place]) 63/84/5364  . Bradycardia 09/01/2018  . Gangrene of left foot ([place]) 05/28/2018   . 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 Obstructive sleep apnea syndrome 05/04/2018  . Obstructive sleep apnea treated with continuous positive airway pressure (CPAP) 05/04/2018  . Type II diabetes mellitus, uncontrolled ([place]) 04/22/2018  . Atherosclerosis of native arteries of the extremities with gangrene ([place]) 04/09/2018  . Atherosclerosis of native artery of extremity ([place]) 04/09/2018  . Cellulitis in diabetic foot ([place]) 04/04/2018  . Paroxysmal atrial fibrillation ([place]) 12/05/2017  . Cardiac pacemaker in situ   . Symptomatic bradycardia   . Asystole ([place])   . 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 History of aortic valve replacement 12/02/2017  . Severe aortic stenosis   . Bilateral impacted cerumen 01/22/2016  . Essential hypertension 11/14/2015  . Peripheral vascular disease ([place]) 05/24/2014  . Chronic kidney disease 08/30/2013  . Hypothyroidism 08/30/2013  . 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 Diabetes mellitus type 2, uncomplicated ([place])   . Aortic valve stenosis 07/25/2011  . Hyperlipidemia 01/10/2011  . CAD (coronary artery disease) 01/10/2011  . Coronary arteriosclerosis 01/10/2011    Rinaldo Cloud Betha Loa 12/22/2018, 2:26 PM  [facility] [address] [place], [name], [address] Phone: [phone]   Fax:  [phone]  Name: Steven Maldonado MRN: [id number] Date of Birth: 28-Jan-1941

## 2018-12-25 ENCOUNTER — Encounter: Payer: Self-pay | Admitting: Physical Therapy

## 2018-12-25 ENCOUNTER — Ambulatory Visit: Payer: Medicare Other | Admitting: Physical Therapy

## 2018-12-25 DIAGNOSIS — R2689 Other abnormalities of gait and mobility: Secondary | ICD-10-CM | POA: Diagnosis not present

## 2018-12-25 DIAGNOSIS — M6281 Muscle weakness (generalized): Secondary | ICD-10-CM

## 2018-12-25 DIAGNOSIS — R293 Abnormal posture: Secondary | ICD-10-CM | POA: Diagnosis not present

## 2018-12-25 DIAGNOSIS — M6249 Contracture of muscle, multiple sites: Secondary | ICD-10-CM | POA: Diagnosis not present

## 2018-12-25 DIAGNOSIS — R2681 Unsteadiness on feet: Secondary | ICD-10-CM

## 2018-12-28 ENCOUNTER — Ambulatory Visit (INDEPENDENT_AMBULATORY_CARE_PROVIDER_SITE_OTHER): Payer: Medicare Other

## 2018-12-28 DIAGNOSIS — R001 Bradycardia, unspecified: Secondary | ICD-10-CM

## 2018-12-28 DIAGNOSIS — I442 Atrioventricular block, complete: Secondary | ICD-10-CM

## 2018-12-28 LAB — CUP PACEART REMOTE DEVICE CHECK
Battery Remaining Longevity: 121 mo
Battery Voltage: 2.99 V
Brady Statistic AP VP Percent: 57.37 %
Brady Statistic AP VS Percent: 0.02 %
Brady Statistic AS VP Percent: 42.46 %
Brady Statistic AS VS Percent: 0.15 %
Brady Statistic RA Percent Paced: 57.41 %
Brady Statistic RV Percent Paced: 99.83 %
Date Time Interrogation Session: 20200217050429
Implantable Lead Implant Date: 20190124
Implantable Lead Implant Date: 20190124
Implantable Lead Location: 753859
Implantable Lead Location: 753860
Implantable Lead Model: 5076
Implantable Lead Model: 5076
Implantable Pulse Generator Implant Date: 20190124
Lead Channel Impedance Value: 323 Ohm
Lead Channel Impedance Value: 361 Ohm
Lead Channel Impedance Value: 456 Ohm
Lead Channel Impedance Value: 475 Ohm
Lead Channel Pacing Threshold Amplitude: 0.625 V
Lead Channel Pacing Threshold Amplitude: 0.75 V
Lead Channel Pacing Threshold Pulse Width: 0.4 ms
Lead Channel Pacing Threshold Pulse Width: 0.4 ms
Lead Channel Sensing Intrinsic Amplitude: 2.375 mV
Lead Channel Sensing Intrinsic Amplitude: 2.375 mV
Lead Channel Sensing Intrinsic Amplitude: 24.875 mV
Lead Channel Sensing Intrinsic Amplitude: 7.75 mV
Lead Channel Setting Pacing Amplitude: 2 V
Lead Channel Setting Pacing Amplitude: 2.5 V
Lead Channel Setting Pacing Pulse Width: 0.4 ms
Lead Channel Setting Sensing Sensitivity: 4 mV

## 2018-12-28 NOTE — Therapy (Signed)
[facility] [address] [place], [name], [address] Phone: [phone]   Fax:  [phone]  Physical Therapy Treatment  Patient Details  Name: Steven Maldonado MRN: [id number] Date of Birth: 28-Apr-1941 Referring Provider (PT): Mechele Claude, [place]   Encounter Date: 12/25/2018     12/25/18 1408  PT Visits / Re-Eval  Visit Number 31  Number of Visits 50  Date for PT Re-Evaluation 02/26/19  Authorization  Authorization Type Medicare & Federal BCBS  Authorization Time Period Medicare guidelines, Fed BCBS waves co-pay.  75 visit limit PT, OT & ST combined with 1 used.  Authorization - Visit Number 11  Authorization - Number of Visits 75  PT Time Calculation  PT Start Time 1402  PT Stop Time 1445  PT Time Calculation (min) 43 min  PT - End of Session  Equipment Utilized During Treatment Gait belt  Activity Tolerance Patient tolerated treatment well;No increased pain;Patient limited by fatigue  Behavior During Therapy [facility] for tasks assessed/performed    Past Medical History:  Diagnosis Date  . Chronic diastolic CHF (congestive heart failure) ([place])   . CKD (chronic kidney disease), stage III ([place])   . Constipation   . Coronary artery disease    a. Cath February 2012 in [facility], occluded RCA with collaterals  . DM type 2 (diabetes mellitus, type 2) ([place])   . Essential hypertension   . Hyperlipidemia   . Neuropathy    feet  . Pacemaker    a. symptomatic brady after TAVR s/p MDT PPM by Dr. Curt Bears 12/04/17  . Persistent atrial fibrillation   . PONV (postoperative nausea and vomiting)    after valve surgery  . PVD (peripheral vascular disease) ([place])    a. s/p R popliteal artery stenosis tx with drug-coated balloon 05/2014, followed by Dr. Fletcher Anon.  . 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  Severe aortic stenosis    a. 12/02/17: s/p TAVR  . Skin cancer   . Sleep apnea with use of continuous positive airway pressure (CPAP)    04-11-11 AHI was 32.9 and titrated to 15 cm H20, DME is [facility]  . Subclinical hypothyroidism     Past Surgical History:  Procedure Laterality Date  . ABDOMINAL ANGIOGRAM N/A 06/08/2014   Procedure: ABDOMINAL ANGIOGRAM;  Surgeon: Wellington Hampshire, MD;  Location: [facility] CATH LAB;  Service: Cardiovascular;  Laterality: N/A;  . ABDOMINAL AORTOGRAM N/A 04/09/2018   Procedure: ABDOMINAL AORTOGRAM;  Surgeon: Conrad , MD;  Location: [facility] CV LAB;  Service: Cardiovascular;  Laterality: N/A;  . AMPUTATION Left 04/17/2018   Procedure: LEFT FOOT 3RD RAY AMPUTATION;  Surgeon: Newt Minion, MD;  Location: [facility];  Service: Orthopedics;  Laterality: Left;  . AMPUTATION Left 05/28/2018   Procedure: LEFT AMPUTATION BELOW KNEE;  Surgeon: Wylene Simmer, MD;  Location: [facility];  Service: Orthopedics;  Laterality: Left;  . APPENDECTOMY  1965  . 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 CARDIAC CATHETERIZATION  12/2010  . CARDIOVERSION  07/2011  . CARDIOVERSION N/A 04/18/2014   Procedure: CARDIOVERSION;  Surgeon: Dorothy Spark, MD;  Location[facility] ENDOSCOPY;  Service: Cardiovascular;  Laterality: N/A;  . CARDIOVERSION N/A 11/03/2015   Procedure: CARDIOVERSION;  Surgeon: Lelon Perla, MD;  Location: [facility] ENDOSCOPY;  Service: Cardiovascular;  Laterality: N/A;  . CARDIOVERSION N/A 05/08/2017   Procedure: CARDIOVERSION;  Surgeon:  Dorothy Spark, MD;  Location: [facility] ENDOSCOPY;  Service: Cardiovascular;  Laterality: N/A;  . CARDIOVERSION N/A 07/28/2017   Procedure: CARDIOVERSION;  Surgeon: Dorothy Spark, MD;  Location: [facility];  Service: Cardiovascular;  Laterality: N/A;  . LOWER EXTREMITY ANGIOGRAM N/A 06/08/2014   Procedure: LOWER EXTREMITY ANGIOGRAM;  Surgeon: Wellington Hampshire, MD;  Location: [facility] CATH LAB;  Service: Cardiovascular;  Laterality: N/A;  . LOWER EXTREMITY  ANGIOGRAPHY Left 04/09/2018   Procedure: Lower Extremity Angiography;  Surgeon: Conrad Rothsay, MD;  Location: [facility] CV LAB;  Service: Cardiovascular;  Laterality: Left;  . PACEMAKER IMPLANT N/A 12/04/2017   Procedure: PACEMAKER IMPLANT;  Surgeon: Constance Haw, MD;  Location: [facility] CV LAB;  Service: Cardiovascular;  Laterality: N/A;  . PERIPHERAL VASCULAR BALLOON ANGIOPLASTY Left 04/09/2018   Procedure: PERIPHERAL VASCULAR BALLOON ANGIOPLASTY;  Surgeon: Conrad Mentor, MD;  Location: [place] CV LAB;  Service: Cardiovascular;  Laterality: Left;  SFA  . POPLITEAL ARTERY ANGIOPLASTY Right 06/08/2014   Archie Endo 06/08/2014  . RIGHT/LEFT HEART CATH AND CORONARY ANGIOGRAPHY N/A 10/08/2017   Procedure: RIGHT/LEFT HEART CATH AND CORONARY ANGIOGRAPHY;  Surgeon: Burnell Blanks, MD;  Location: [facility] CV LAB;  Service: Cardiovascular;  Laterality: N/A;  . SKIN CANCER EXCISION Bilateral    "have had them cut off back of neck X 2; off left upper arm; right wrist, near right shoulder blade" (06/08/2014)  . TEE WITHOUT CARDIOVERSION N/A 12/02/2017   Procedure: TRANSESOPHAGEAL ECHOCARDIOGRAM (TEE);  Surgeon: Burnell Blanks, MD;  Location: [facility];  Service: Open Heart Surgery;  Laterality: N/A;  . TEMPORARY PACEMAKER N/A 12/04/2017   Procedure: TEMPORARY PACEMAKER;  Surgeon: Leonie Man, MD;  Location: [facility] CV LAB;  Service: Cardiovascular;  Laterality: N/A;  . TRANSCATHETER AORTIC VALVE REPLACEMENT, TRANSFEMORAL N/A 12/02/2017   Procedure: TRANSCATHETER AORTIC VALVE REPLACEMENT, TRANSFEMORAL;  Surgeon: Burnell Blanks, MD;  Location: [facility];  Service: Open Heart Surgery;  Laterality: N/A;  using Edwards Sapien 3 Transcatheter Heart Valve size 65mm    There were no vitals filed for this visit.     12/25/18 1404  Symptoms/Limitations  Subjective Saw Gerald Stabs, he made adjustments for the toe in. No falls.   Pertinent History L TTA, CAD, PAF,  pacemaker, DM2,  neuropathy, CKD, HTN,   Limitations Lifting;Standing;Walking;House hold activities  Patient Stated Goals To use prosthesis in community, travel (uses Lucianne Lei), take of himself so wife does not have to do it. Housework.   Pain Assessment  Currently in Pain? Yes  Pain Score 2  Pain Location Leg  Pain Orientation Right;Lateral  Pain Descriptors / Indicators Burning;Tender  Pain Type Acute pain;Phantom pain  Pain Onset 1 to 4 weeks ago  Pain Frequency Intermittent  Aggravating Factors  feels it moslty with no bearing weight, does not feel it with walking  Pain Relieving Factors walking, rest      12/25/18 1409  Transfers  Transfers Sit to Stand;Stand to Sit  Sit to Stand 5: Supervision;6: Modified independent (Device/Increase time);With upper extremity assist;With armrests;From chair/3-in-1  Sit to Stand Details Visual cues for safe use of DME/AE;Visual cues/gestures for sequencing;Verbal cues for technique  Sit to Stand Details (indicate cue type and reason) continues to need UE support for stability   Stand to Sit 5: Supervision;6: Modified independent (Device/Increase time);With upper extremity assist;With armrests;To chair/3-in-1  Stand to Sit Details (indicate cue type and reason) Verbal cues for technique;Verbal cues for precautions/safety;Verbal cues for safe use of DME/AE  Stand  to Sit Details cues for hand placement with use of rollator. reminder cues to lock rollator prior to sitting down.   Ambulation/Gait  Ambulation/Gait Yes  Ambulation/Gait Assistance 5: Supervision;4: Min guard  Ambulation/Gait Assistance Details cues on rollator position and step placement. pt demo'd good/safe use of brakes on rollator.   Ambulation Distance (Feet) 115 Feet (x2,  200  x1 in/outdoor)  Assistive device Rollator;Rolling walker  Gait Pattern Step-through pattern;Decreased stride length;Decreased trunk rotation;Trunk flexed  Ambulation Surface Level;Indoor;Unlevel;Outdoor;Paved  Ramp 5:  Supervision  Ramp Details (indicate cue type and reason) with rollator/prosthesis on outdoor inclines/declines.   Curb 5: Supervision  Curb Details (indicate cue type and reason) with rollator/prosthesis on outdoor curb.   Prosthetics  Prosthetic Care Comments  re-inforced education on proper placement of barrier sock at top of limb under liner for sweat as it was in popliteal fold/over knee.   Current prosthetic wear tolerance (days/week)  daily   Current prosthetic wear tolerance (#hours/day)  5 hrs 2x/day  Residual limb condition  open blister on anterior aspect of limb now with yelllow escahar over it. covered with tegaderm so did not remove the intact tegaderm.   Education Provided Residual limb care;Skin check;Proper wear schedule/adjustment;Proper weight-bearing schedule/adjustment  Person(s) Educated Patient  Education Method Explanation;Demonstration;Verbal cues  Education Method Verbalized understanding;Verbal cues required;Needs further instruction  Donning Prosthesis 5  Doffing Prosthesis 6        PT Short Term Goals - 12/01/18 1400      PT SHORT TERM GOAL #1   Title  Patient demonstrates proper donning without cues modified independent. (All updated STGs Target Date: 01/01/2019)    Time  1    Period  Months    Status  New    Target Date  01/01/19      PT SHORT TERM GOAL #2   Title  Patient tolerates prosthesis wear >12 hrs total/day with limb pain </= 2/10.     Time  1    Period  Months    Status  New    Target Date  01/01/19      PT SHORT TERM GOAL #3   Title  Patient negotiates ramps & curbs with RW & prosthesis with supervision.     Time  1    Period  Months    Status  New    Target Date  01/01/19      PT SHORT TERM GOAL #4   Title  Patient ambulates 45' with cane & moderate hand hold assist.     Time  1    Period  Months    Status  New    Target Date  01/01/19        PT Long Term Goals - 12/01/18 1900      PT LONG TERM GOAL #1   Title  Patient  verbalizes & demonstrates proper prosthetic care including donning / doffing independently to enable safe use of prosthesis (All LTGs Target Date: 02/26/2019)    Time  3    Period  Months    Status  On-going    Target Date  02/26/19      PT LONG TERM GOAL #2   Title  Patient tolerates prosthesis wear daily for >90% of awake hours without skin or limb pain issues to enable function throughout his day.     Time  3    Period  Months    Status  On-going    Target Date  02/26/19  PT LONG TERM GOAL #3   Title  Berg Balance >/= 45/56 to indicate lower fall risk & less dependency in standing ADLs.     Time  3    Period  Months    Status  Revised    Target Date  02/26/19      PT LONG TERM GOAL #4   Title  Patient ambulates 600' with RW (or rollator) & prosthesis outdoors on paved or grass up to 50' surfaces modified independent for community mobility.     Time  3    Period  Months    Status  Revised    Target Date  02/26/19      PT LONG TERM GOAL #5   Title  Patient negotiates ramps, curbs with walker & stairs 1 rail /cane modified independent for community access.     Time  3    Period  Months    Status  On-going    Target Date  02/26/19      Additional Long Term Goals   Additional Long Term Goals  Yes      PT LONG TERM GOAL #6   Title  Patient ambulates 40' around furniture with cane & prosthesis modified independent for household mobility.     Time  3    Period  Months    Status  New    Target Date  02/26/19         12/25/18 1409  Plan  Clinical Impression Statement Today's skilled session continued to focus on residual limb care due to new wound and use of rollator/prosthesis with gait. The pt is progressing well toward goals and should benefit from continued PT to progress toward unmet goals.   Pt will benefit from skilled therapeutic intervention in order to improve on the following deficits Abnormal gait;Decreased activity tolerance;Decreased balance;Decreased  endurance;Decreased knowledge of use of DME;Decreased mobility;Impaired flexibility;Decreased range of motion;Decreased strength;Decreased skin integrity;Dizziness;Postural dysfunction;Prosthetic Dependency  Rehab Potential Good  PT Frequency 2x / week  PT Duration Other (comment) (90 days (13 weeks))  PT Treatment/Interventions ADLs/Self Care Home Management;Canalith Repostioning;DME Instruction;Gait training;Stair training;Functional mobility training;Therapeutic activities;Therapeutic exercise;Balance training;Neuromuscular re-education;Patient/family education;Prosthetic Training;Manual techniques;Vestibular  PT Next Visit Plan check residual limb pain, review rollator use including ramps & curbs, work towards updated STGs  Consulted and Agree with Plan of Care Patient          Patient will benefit from skilled therapeutic intervention in order to improve the following deficits and impairments:  Abnormal gait, Decreased activity tolerance, Decreased balance, Decreased endurance, Decreased knowledge of use of DME, Decreased mobility, Impaired flexibility, Decreased range of motion, Decreased strength, Decreased skin integrity, Dizziness, Postural dysfunction, Prosthetic Dependency  Visit Diagnosis: Muscle weakness (generalized)  Other abnormalities of gait and mobility  Abnormal posture  Unsteadiness on feet     Problem List Patient Active Problem List   Diagnosis Date Noted  . Controlled type 2 diabetes mellitus without complication ([place]) 76/73/4193  . Bradycardia 09/01/2018  . Gangrene of left foot ([place]) 05/28/2018  . 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 Obstructive sleep apnea syndrome 05/04/2018  . Obstructive sleep apnea treated with continuous positive airway pressure (CPAP) 05/04/2018  . Type II diabetes mellitus, uncontrolled ([place]) 04/22/2018  . Atherosclerosis of native arteries of the extremities with gangrene ([place])  04/09/2018  . Atherosclerosis of native artery of extremity ([place]) 04/09/2018  . Cellulitis in diabetic foot ([place]) 04/04/2018  . Paroxysmal atrial fibrillation ([place]) 12/05/2017  .  Cardiac pacemaker in situ   . Symptomatic bradycardia   . Asystole ([place])   . 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 History of aortic valve replacement 12/02/2017  . Severe aortic stenosis   . Bilateral impacted cerumen 01/22/2016  . Essential hypertension 11/14/2015  . Peripheral vascular disease ([place]) 05/24/2014  . Chronic kidney disease 08/30/2013  . Hypothyroidism 08/30/2013  . 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 Diabetes mellitus type 2, uncomplicated ([place])   . Aortic valve stenosis 07/25/2011  . Hyperlipidemia 01/10/2011  . CAD (coronary artery disease) 01/10/2011  . Coronary arteriosclerosis 01/10/2011    Willow Ora, PTA, [facility] [address] [place], [place] [address] [phone] 12/28/18, 11:29 AM   Name: Steven Maldonado MRN: [id number] Date of Birth: 1940-12-19

## 2018-12-29 ENCOUNTER — Ambulatory Visit: Payer: Medicare Other | Admitting: Physical Therapy

## 2018-12-29 ENCOUNTER — Encounter: Payer: Self-pay | Admitting: Physical Therapy

## 2018-12-29 DIAGNOSIS — M6281 Muscle weakness (generalized): Secondary | ICD-10-CM | POA: Diagnosis not present

## 2018-12-29 DIAGNOSIS — R2689 Other abnormalities of gait and mobility: Secondary | ICD-10-CM | POA: Diagnosis not present

## 2018-12-29 DIAGNOSIS — M6249 Contracture of muscle, multiple sites: Secondary | ICD-10-CM | POA: Diagnosis not present

## 2018-12-29 DIAGNOSIS — R293 Abnormal posture: Secondary | ICD-10-CM | POA: Diagnosis not present

## 2018-12-29 DIAGNOSIS — R2681 Unsteadiness on feet: Secondary | ICD-10-CM | POA: Diagnosis not present

## 2018-12-30 NOTE — Therapy (Signed)
[facility] [address] [place], [name], [address] Phone: [phone]   Fax:  [phone]  Physical Therapy Treatment  Patient Details  Name: Steven Maldonado MRN: [id number] Date of Birth: Jul 01, 1941 Referring Provider (PT): Mechele Claude, [place]   Encounter Date: 12/29/2018  PT End of Session - 12/29/18 1544    Visit Number  32    Number of Visits  50    Date for PT Re-Evaluation  02/26/19    Authorization Type  Medicare & Federal BCBS    Authorization Time Period  Medicare guidelines, Fed BCBS waves co-pay.  75 visit limit PT, OT & ST combined with 1 used.    Authorization - Visit Number  12    Authorization - Number of Visits  75    PT Start Time  1532    PT Stop Time  8527    PT Time Calculation (min)  43 min    Equipment Utilized During Treatment  Gait belt    Activity Tolerance  Patient tolerated treatment well;No increased pain;Patient limited by fatigue    Behavior During Therapy  [facility] for tasks assessed/performed       Past Medical History:  Diagnosis Date  . Chronic diastolic CHF (congestive heart failure) ([place])   . CKD (chronic kidney disease), stage III ([place])   . Constipation   . Coronary artery disease    a. Cath February 2012 in [facility], occluded RCA with collaterals  . DM type 2 (diabetes mellitus, type 2) ([place])   . Essential hypertension   . Hyperlipidemia   . Neuropathy    feet  . Pacemaker    a. symptomatic brady after TAVR s/p MDT PPM by Dr. Curt Bears 12/04/17  . Persistent atrial fibrillation   . PONV (postoperative nausea and vomiting)    after valve surgery  . PVD (peripheral vascular disease) ([place])    a. s/p R popliteal artery stenosis tx with drug-coated balloon 05/2014, followed by Dr. Fletcher Anon.  . 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 Severe aortic stenosis    a. 12/02/17: s/p TAVR  . Skin cancer   . Sleep apnea with use of continuous positive airway pressure (CPAP)    04-11-11 AHI was 32.9 and titrated to 15 cm H20, DME is [facility]  . Subclinical hypothyroidism     Past Surgical History:  Procedure Laterality Date  . ABDOMINAL ANGIOGRAM N/A 06/08/2014   Procedure: ABDOMINAL ANGIOGRAM;  Surgeon: Wellington Hampshire, MD;  Location: [facility] CATH LAB;  Service: Cardiovascular;  Laterality: N/A;  . ABDOMINAL AORTOGRAM N/A 04/09/2018   Procedure: ABDOMINAL AORTOGRAM;  Surgeon: Conrad [place], MD;  Location: [facility] CV LAB;  Service: Cardiovascular;  Laterality: N/A;  . AMPUTATION Left 04/17/2018   Procedure: LEFT FOOT 3RD RAY AMPUTATION;  Surgeon: Newt Minion, MD;  Location: [facility];  Service: Orthopedics;  Laterality: Left;  . AMPUTATION Left 05/28/2018   Procedure: LEFT AMPUTATION BELOW KNEE;  Surgeon: Wylene Simmer, MD;  Location: [facility];  Service: Orthopedics;  Laterality: Left;  . APPENDECTOMY  1965  . 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 CARDIAC CATHETERIZATION  12/2010  . CARDIOVERSION  07/2011  . CARDIOVERSION N/A 04/18/2014   Procedure: CARDIOVERSION;  Surgeon: Dorothy Spark, MD;  Location: [facility] ENDOSCOPY;  Service: Cardiovascular;  Laterality: N/A;  . CARDIOVERSION N/A 11/03/2015   Procedure: CARDIOVERSION;  Surgeon: Lelon Perla, MD;  Location: [facility] ENDOSCOPY;  Service: Cardiovascular;  Laterality: N/A;  . CARDIOVERSION N/A 05/08/2017   Procedure: CARDIOVERSION;  Surgeon: Dorothy Spark, MD;  Location: [facility] ENDOSCOPY;  Service: Cardiovascular;  Laterality: N/A;  . CARDIOVERSION N/A 07/28/2017   Procedure: CARDIOVERSION;  Surgeon: Dorothy Spark, MD;  Location: [place];  Service: Cardiovascular;  Laterality: N/A;  . LOWER EXTREMITY ANGIOGRAM N/A 06/08/2014   Procedure: LOWER EXTREMITY ANGIOGRAM;  Surgeon: Wellington Hampshire, MD;  Location: [facility] CATH LAB;  Service: Cardiovascular;  Laterality: N/A;  . LOWER EXTREMITY ANGIOGRAPHY Left 04/09/2018    Procedure: Lower Extremity Angiography;  Surgeon: Conrad [place], MD;  Location: [place] CV LAB;  Service: Cardiovascular;  Laterality: Left;  . PACEMAKER IMPLANT N/A 12/04/2017   Procedure: PACEMAKER IMPLANT;  Surgeon: Constance Haw, MD;  Location: [place] CV LAB;  Service: Cardiovascular;  Laterality: N/A;  . PERIPHERAL VASCULAR BALLOON ANGIOPLASTY Left 04/09/2018   Procedure: PERIPHERAL VASCULAR BALLOON ANGIOPLASTY;  Surgeon: Conrad , MD;  Location: [place] CV LAB;  Service: Cardiovascular;  Laterality: Left;  SFA  . POPLITEAL ARTERY ANGIOPLASTY Right 06/08/2014   Archie Endo 06/08/2014  . RIGHT/LEFT HEART CATH AND CORONARY ANGIOGRAPHY N/A 10/08/2017   Procedure: RIGHT/LEFT HEART CATH AND CORONARY ANGIOGRAPHY;  Surgeon: Burnell Blanks, MD;  Location: [facility] CV LAB;  Service: Cardiovascular;  Laterality: N/A;  . SKIN CANCER EXCISION Bilateral    "have had them cut off back of neck X 2; off left upper arm; right wrist, near right shoulder blade" (06/08/2014)  . TEE WITHOUT CARDIOVERSION N/A 12/02/2017   Procedure: TRANSESOPHAGEAL ECHOCARDIOGRAM (TEE);  Surgeon: Burnell Blanks, MD;  Location: [facility];  Service: Open Heart Surgery;  Laterality: N/A;  . TEMPORARY PACEMAKER N/A 12/04/2017   Procedure: TEMPORARY PACEMAKER;  Surgeon: Leonie Man, MD;  Location: [place] CV LAB;  Service: Cardiovascular;  Laterality: N/A;  . TRANSCATHETER AORTIC VALVE REPLACEMENT, TRANSFEMORAL N/A 12/02/2017   Procedure: TRANSCATHETER AORTIC VALVE REPLACEMENT, TRANSFEMORAL;  Surgeon: Burnell Blanks, MD;  Location: [facility];  Service: Open Heart Surgery;  Laterality: N/A;  using Edwards Sapien 3 Transcatheter Heart Valve size 18mm    There were no vitals filed for this visit.  Subjective Assessment - 12/29/18 1536    Subjective  Left the prosthesis off for 2 days to allow wound to heal. No feels it's opened back up since starting with the prosthesis again.     Patient  Stated Goals  To use prosthesis in community, travel (uses [employer]ianne Lei), take of himself so wife does not have to do it. Housework.     Currently in Pain?  No/denies    Pain Score  0-No pain        [facility] Adult PT Treatment/Exercise - 12/29/18 1545      Transfers   Transfers  Sit to Stand;Stand to Sit    Sit to Stand  6: Modified independent (Device/Increase time);With upper extremity assist    Stand to Sit  6: Modified independent (Device/Increase time);With upper extremity assist      Ambulation/Gait   Ambulation/Gait  Yes    Ambulation/Gait Assistance  5: Supervision;4: Min guard    Ambulation/Gait Assistance Details  heavy reliance on rollator with gait noted.     Ambulation Distance (Feet)  --   into/out of/around gym    Assistive device  Rollator;Prosthesis    Gait Pattern  Step-through pattern;Decreased stride length;Decreased trunk rotation;Trunk flexed    Ambulation Surface  Level;Indoor      High Level Balance   High Level Balance Activities  Side stepping;Marching forwards;Backward walking    High Level Balance Comments  in parallel bars wtih emphasis on tall posture/decr UE support for 3 laps each way. min guard to min assist for balance with cues on posture, weight shifting.                    Prosthetics   Prosthetic Care Comments   edcuated to continue use of Tegederm for now, wound does appear to be healing.     Current prosthetic wear tolerance (days/week)   daily     Current prosthetic wear tolerance (#hours/day)   5 hrs 2x/day    Residual limb condition   blister on anterior aspect of limb. replaced the tegaderm as the current one was rolled up.     Education Provided  Residual limb care;Skin check;Proper wear schedule/adjustment;Proper weight-bearing schedule/adjustment    Person(s) Educated  Patient    Education Method  Explanation;Demonstration;Verbal cues    Education Method  Verbalized understanding;Needs further instruction;Verbal cues required;Returned  demonstration    Donning Prosthesis  Supervision    Doffing Prosthesis  Modified independent (device/increased time)          Balance Exercises - 12/29/18 1603      Balance Exercises: Standing   Standing Eyes Closed  Wide (BOA);Head turns;Foam/compliant surface;Other reps (comment);30 secs;Limitations      Balance Exercises: Standing   Standing Eyes Closed Limitations  on airex in parallel bars with no to light fingertip touch for balance: EC no head movements, progressing to EC head movements left<>right, then up<>down. min guard to min assist for balance, cues on posture and weight shifting for balance.                            PT Short Term Goals - 12/01/18 1400      PT SHORT TERM GOAL #1   Title  Patient demonstrates proper donning without cues modified independent. (All updated STGs Target Date: 01/01/2019)    Time  1    Period  Months    Status  New    Target Date  01/01/19      PT SHORT TERM GOAL #2   Title  Patient tolerates prosthesis wear >12 hrs total/day with limb pain </= 2/10.     Time  1    Period  Months    Status  New    Target Date  01/01/19      PT SHORT TERM GOAL #3   Title  Patient negotiates ramps & curbs with RW & prosthesis with supervision.     Time  1    Period  Months    Status  New    Target Date  01/01/19      PT SHORT TERM GOAL #4   Title  Patient ambulates 33' with cane & moderate hand hold assist.     Time  1    Period  Months    Status  New    Target Date  01/01/19        PT Long Term Goals - 12/01/18 1900      PT LONG TERM GOAL #1   Title  Patient verbalizes & demonstrates proper prosthetic care including donning / doffing independently to enable safe use of prosthesis (All LTGs Target Date: 02/26/2019)    Time  3    Period  Months    Status  On-going    Target Date  02/26/19      PT LONG TERM GOAL #2   Title  Patient tolerates prosthesis wear daily for >90% of awake hours without skin or limb pain issues to enable  function throughout his day.     Time  3    Period  Months    Status  On-going    Target Date  02/26/19      PT LONG TERM GOAL #3   Title  Berg Balance >/= 45/56 to indicate lower fall risk & less dependency in standing ADLs.     Time  3    Period  Months    Status  Revised    Target Date  02/26/19      PT LONG TERM GOAL #4   Title  Patient ambulates 600' with RW (or rollator) & prosthesis outdoors on paved or grass up to 50' surfaces modified independent for community mobility.     Time  3    Period  Months    Status  Revised    Target Date  02/26/19      PT LONG TERM GOAL #5   Title  Patient negotiates ramps, curbs with walker & stairs 1 rail /cane modified independent for community access.     Time  3    Period  Months    Status  On-going    Target Date  02/26/19      Additional Long Term Goals   Additional Long Term Goals  Yes      PT LONG TERM GOAL #6   Title  Patient ambulates 72' around furniture with cane & prosthesis modified independent for household mobility.     Time  3    Period  Months    Status  New    Target Date  02/26/19            Plan - 12/29/18 1545    Clinical Impression Statement  Today's skilled session continued to address skin/prosthetic management with wound appearing to be healing and on balance reactions. The pt is progressing well toward goals, still with reports of tenderness/pain on limb needing rest breaks. The pt is progressing toward goals and should benefit from continued PT to progress toward unmet goals.     Rehab Potential  Good    PT Frequency  2x / week    PT Duration  Other (comment)   90 days (13 weeks)   PT Treatment/Interventions  ADLs/Self Care Home Management;Canalith Repostioning;DME Instruction;Gait training;Stair training;Functional mobility training;Therapeutic activities;Therapeutic exercise;Balance training;Neuromuscular re-education;Patient/family education;Prosthetic Training;Manual techniques;Vestibular    PT  Next Visit Plan  check STGs (due 01/01/19), montior residual limb, continue to work on gait with cane for short distances, rollator for longer/community distances, balance reactions    Consulted and Agree with Plan of Care  Patient       Patient will benefit from skilled therapeutic intervention in order to improve the following deficits and impairments:  Abnormal gait, Decreased activity tolerance, Decreased balance, Decreased endurance, Decreased knowledge of use of DME, Decreased mobility, Impaired flexibility, Decreased range of motion, Decreased strength, Decreased skin integrity, Dizziness, Postural dysfunction, Prosthetic Dependency  Visit Diagnosis: Muscle weakness (generalized)  Other abnormalities of gait and mobility  Abnormal posture  Unsteadiness on feet     Problem List Patient Active Problem List   Diagnosis Date Noted  . Controlled type 2 diabetes mellitus without complication ([place]) 78/67/6720  . Bradycardia 09/01/2018  .  Gangrene of left foot ([place]) 05/28/2018  . 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 Obstructive sleep apnea syndrome 05/04/2018  . Obstructive sleep apnea treated with continuous positive airway pressure (CPAP) 05/04/2018  . Type II diabetes mellitus, uncontrolled ([place]) 04/22/2018  . Atherosclerosis of native arteries of the extremities with gangrene ([place]) 04/09/2018  . Atherosclerosis of native artery of extremity ([place]) 04/09/2018  . Cellulitis in diabetic foot ([place]) 04/04/2018  . Paroxysmal atrial fibrillation ([place]) 12/05/2017  . Cardiac pacemaker in situ   . Symptomatic bradycardia   . Asystole ([place])   . 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 History of aortic valve replacement 12/02/2017  . Severe aortic stenosis   . Bilateral impacted cerumen 01/22/2016  . Essential hypertension 11/14/2015  . Peripheral vascular disease ([place]) 05/24/2014  . Chronic kidney disease 08/30/2013  .  Hypothyroidism 08/30/2013  . 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 Diabetes mellitus type 2, uncomplicated ([place])   . Aortic valve stenosis 07/25/2011  . Hyperlipidemia 01/10/2011  . CAD (coronary artery disease) 01/10/2011  . Coronary arteriosclerosis 01/10/2011   Willow Ora, PTA, [facility] [address] [place], [place] [address] [phone] 12/30/18, 11:02 PM   Name: Steven Maldonado MRN: [id number] Date of Birth: 1941-09-10

## 2018-12-31 NOTE — Progress Notes (Signed)
[facility]  PATIENT: Steven Maldonado DOB: Nov 02, 1941   REASON FOR VISIT: follow up for OSA, for CPAP compliance HISTORY FROM: Patient    HISTORY OF PRESENT ILLNESS:UPDATE 2/24/2020CM Steven Maldonado, 78 year old male returns for follow-up with obstructive sleep apnea here for CPAP compliance.  Patient likes his new machine but states he still has  leak and has been using parts from an old mask.  He has less daytime drowsiness.  Compliance data dated 12/05/2018 -01/03/2019 shows compliance greater than 4 hours at 60% for 18 days.    Average usage 6 hours 44 minutes . Set pressure 6 to 15 cm EPR level 2 leak 95th percentile 97 AHI 4.2 ESS 3.  He returns for reevaluation UPDATE 10/24/2019CM Steven Maldonado, 78 year old male returns for follow-up with obstructive sleep apnea here for CPAP compliance.  Patient states he is doing well with his CPAP however he has a significant leak and his wife is sleeping in the other room.  CPAP data dated 08/04/2018-09/02/2018 shows compliance greater than 4 hours at 80%.  Average usage 6 hours 28 minutes.  Set pressure 6 to 15 cm.  EPR level 2 leak 95th percentile at 102.9.  AHI 8.6 ESS 5.  He just recently got his prosthesis to the left lower leg.  He returns for reevaluation UPDATE 6/24/2019CM Steven Maldonado, 78 year old male returns for follow-up with history of obstructive sleep apnea here for CPAP compliance.  He has a new machine but no card.    Patient states he is doing well on CPAP and he has been compliant.  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  This  has not healed.  He is seated in a wheelchair.  ESS score is 4.  He returns for reevaluation    Machine is over 13 years old. 02-03-2018 . CD I have the pleasure of seeing Steven Maldonado today, a 78 year old Caucasian right-handed gentleman who has a long-standing history of obstructive sleep apnea and has always been compliant on CPAP.  Steven Maldonado had severe  cardiac disease, and had been admitted for a surgery he had an aortic valve replacement. He also now has a pacemaker.  He continues to be highly compliant with CPAP use the machine 28 out of the last 30 days, with 6 hours and 23 minutes average use, set pressure is 14 cmH2O with 3 cm expiratory pressure relief, residual AHI is only 2.2.  He does have  high air leaks on a FFM , he is due to receive a new machine. He snored very loud on nasal pillow. He likes to be refitted for an interface. [facility] - he wants a cleaning machine .   Steven Maldonado is a 78 year old male with a history of obstructive sleep apnea on CPAP. He returns today for follow-up. He is currently using the CPAP 25 out of 30 days for compliance of 83%. He uses his machine greater than 4 hours 23 out of 30 days for compliance of 77%. On average he uses his machine 7 hours and 17 minutes. He is on a pressure of 14 cm water with EPR 3. His residual AHI is 1.8. He does have a significant leak in the 95th percentile at 49.3 L/m. He states that he is only able to change his straps every 6 months and they do get loose quickly. He also states that he does know that he pushes his mask off his face at  night as his wife has witnessed him do this. He states that he is unaware that he does this. He does note that he does feel air leaking around the mask when he puts it on. He returns today for an evaluation  REVIEW OF SYSTEMS: Full 14 system review of systems performed and notable only for those listed, all others are neg:  Constitutional: neg  Cardiovascular: neg Ear/Nose/Throat: neg  Skin: neg Eyes: neg Respiratory: neg Gastroitestinal: neg  Hematology/Lymphatic: neg  Endocrine: neg Musculoskeletal:neg Allergy/Immunology: neg Neurological: neg Psychiatric: neg Sleep : Obstructive sleep apnea with CPAP   ALLERGIES: No Known Allergies  HOME MEDICATIONS: Outpatient Medications Prior to Visit  Medication Sig Dispense Refill    . amiodarone (PACERONE) 200 MG tablet TAKE 1 TABLET BY MOUTH EVERY DAY 90 tablet 3  . atorvastatin (LIPITOR) 80 MG tablet Take 1 tablet (80 mg total) by mouth at bedtime. 90 tablet 3  . Blood Glucose Monitoring Suppl (FREESTYLE LITE) DEVI 1 each by Does not apply route 2 (two) times daily. 1 each 0  . Choline Fenofibrate (FENOFIBRIC ACID) 135 MG CPDR TAKE 1 CAPSULE DAILY 90 capsule 3  . dabigatran (PRADAXA) 150 MG CAPS capsule Take 150 mg by mouth every 12 (twelve) hours.     . docusate sodium (COLACE) 100 MG capsule Take 100 mg by mouth 2 (two) times daily.    . furosemide (LASIX) 20 MG tablet Take 1 tablet (20 mg total) by mouth daily. (Patient taking differently: Take 20 mg by mouth every Monday, Wednesday, and Friday. ) 90 tablet 3  . gabapentin (NEURONTIN) 300 MG capsule Take 1 capsule (300 mg total) by mouth 3 (three) times daily. 90 capsule 5  . glipiZIDE (GLUCOTROL XL) 10 MG 24 hr tablet TAKE 1 TABLET (10 MG TOTAL) BY MOUTH DAILY WITH BREAKFAST. 90 tablet 1  . glucose blood (FREESTYLE LITE) test strip 1 each by Other route 4 (four) times daily -  before meals and at bedtime. 450 each 3  . Insulin Degludec (TRESIBA FLEXTOUCH) 200 UNIT/ML SOPN Inject 66 Units into the skin at bedtime. (Patient taking differently: Inject 46 Units into the skin at bedtime. ) 5 pen 2  . insulin lispro (HUMALOG KWIKPEN) 100 UNIT/ML KwikPen Inject 5 units in the skin three times daily with meals (Patient taking differently: Inject 5 Units into the skin 2 (two) times daily. Inject 5 units in the skin twice a day with breakfast and lunch.) 15 pen 2  . Insulin Pen Needle (BD PEN NEEDLE NANO U/F) 32G X 4 MM MISC USE 4 (FOUR) TIMES DAILY. 1000 each 0  . levothyroxine (SYNTHROID, LEVOTHROID) 175 MCG tablet TAKE 1 TABLET BY MOUTH EVERY DAY 90 tablet 1  . metoprolol tartrate (LOPRESSOR) 50 MG tablet TAKE ONE AND ONE-HALF TABLET BY MOUTH TWICE A DAY 270 tablet 2  . mirabegron ER (MYRBETRIQ) 25 MG TB24 tablet Take 1 tablet  (25 mg total) by mouth daily. 90 tablet 3  . pentoxifylline (TRENTAL) 400 MG CR tablet Take 1 tablet (400 mg total) by mouth 3 (three) times daily with meals. 90 tablet 3   No facility-administered medications prior to visit.     PAST MEDICAL HISTORY: Past Medical History:  Diagnosis Date  . Chronic diastolic CHF (congestive heart failure) ([place])   . CKD (chronic kidney disease), stage III ([place])   . Constipation   . Coronary artery disease    a. Cath February 2012 in [place] [facility], occluded RCA with collaterals  . DM type  2 (diabetes mellitus, type 2) ([place])   . Essential hypertension   . Hyperlipidemia   . Neuropathy    feet  . Pacemaker    a. symptomatic brady after TAVR s/p MDT PPM by Dr. Curt Bears 12/04/17  . Persistent atrial fibrillation   . PONV (postoperative nausea and vomiting)    after valve surgery  . PVD (peripheral vascular disease) ([place])    a. s/p R popliteal artery stenosis tx with drug-coated balloon 05/2014, followed by Dr. Fletcher Anon.  . 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 Severe aortic stenosis    a. 12/02/17: s/p TAVR  . Skin cancer   . Sleep apnea with use of continuous positive airway pressure (CPAP)    04-11-11 AHI was 32.9 and titrated to 15 cm H20, DME is [facility]  . Subclinical hypothyroidism     PAST SURGICAL HISTORY: Past Surgical History:  Procedure Laterality Date  . ABDOMINAL ANGIOGRAM N/A 06/08/2014   Procedure: ABDOMINAL ANGIOGRAM;  Surgeon: Wellington Hampshire, MD;  Location: [facility] CATH LAB;  Service: Cardiovascular;  Laterality: N/A;  . ABDOMINAL AORTOGRAM N/A 04/09/2018   Procedure: ABDOMINAL AORTOGRAM;  Surgeon: Conrad Allenville, MD;  Location: [place] CV LAB;  Service: Cardiovascular;  Laterality: N/A;  . AMPUTATION Left 04/17/2018   Procedure: LEFT FOOT 3RD RAY AMPUTATION;  Surgeon: Newt Minion, MD;  Location: [place];  Service:  Orthopedics;  Laterality: Left;  . AMPUTATION Left 05/28/2018   Procedure: LEFT AMPUTATION BELOW KNEE;  Surgeon: Wylene Simmer, MD;  Location: [place];  Service: Orthopedics;  Laterality: Left;  . APPENDECTOMY  1965  . 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 CARDIAC CATHETERIZATION  12/2010  . CARDIOVERSION  07/2011  . CARDIOVERSION N/A 04/18/2014   Procedure: CARDIOVERSION;  Surgeon: Dorothy Spark, MD;  Location: [place];  Service: Cardiovascular;  Laterality: N/A;  . CARDIOVERSION N/A 11/03/2015   Procedure: CARDIOVERSION;  Surgeon: Lelon Perla, MD;  Location: [facility] ENDOSCOPY;  Service: Cardiovascular;  Laterality: N/A;  . CARDIOVERSION N/A 05/08/2017   Procedure: CARDIOVERSION;  Surgeon: Dorothy Spark, MD;  Location: [facility] ENDOSCOPY;  Service: Cardiovascular;  Laterality: N/A;  . CARDIOVERSION N/A 07/28/2017   Procedure: CARDIOVERSION;  Surgeon: Dorothy Spark, MD;  Location: [place];  Service: Cardiovascular;  Laterality: N/A;  . LOWER EXTREMITY ANGIOGRAM N/A 06/08/2014   Procedure: LOWER EXTREMITY ANGIOGRAM;  Surgeon: Wellington Hampshire, MD;  Location: [facility] CATH LAB;  Service: Cardiovascular;  Laterality: N/A;  . LOWER EXTREMITY ANGIOGRAPHY Left 04/09/2018   Procedure: Lower Extremity Angiography;  Surgeon: Conrad Fenwood, MD;  Location: [place] CV LAB;  Service: Cardiovascular;  Laterality: Left;  . PACEMAKER IMPLANT N/A 12/04/2017   Procedure: PACEMAKER IMPLANT;  Surgeon: Constance Haw, MD;  Location: [place] CV LAB;  Service: Cardiovascular;  Laterality: N/A;  . PERIPHERAL VASCULAR BALLOON ANGIOPLASTY Left 04/09/2018   Procedure: PERIPHERAL VASCULAR BALLOON ANGIOPLASTY;  Surgeon: Conrad [place], MD;  Location: [place] CV LAB;  Service: Cardiovascular;  Laterality: Left;  SFA  . POPLITEAL ARTERY ANGIOPLASTY Right 06/08/2014   Archie Endo 06/08/2014  . RIGHT/LEFT HEART CATH AND CORONARY ANGIOGRAPHY N/A 10/08/2017   Procedure: RIGHT/LEFT HEART CATH AND CORONARY  ANGIOGRAPHY;  Surgeon: Burnell Blanks, MD;  Location: [place] CV LAB;  Service: Cardiovascular;  Laterality: N/A;  . SKIN CANCER EXCISION Bilateral    "have had them cut off  back of neck X 2; off left upper arm; right wrist, near right shoulder blade" (06/08/2014)  . TEE WITHOUT CARDIOVERSION N/A 12/02/2017   Procedure: TRANSESOPHAGEAL ECHOCARDIOGRAM (TEE);  Surgeon: Burnell Blanks, MD;  Location: [place];  Service: Open Heart Surgery;  Laterality: N/A;  . TEMPORARY PACEMAKER N/A 12/04/2017   Procedure: TEMPORARY PACEMAKER;  Surgeon: Leonie Man, MD;  Location: [place] CV LAB;  Service: Cardiovascular;  Laterality: N/A;  . TRANSCATHETER AORTIC VALVE REPLACEMENT, TRANSFEMORAL N/A 12/02/2017   Procedure: TRANSCATHETER AORTIC VALVE REPLACEMENT, TRANSFEMORAL;  Surgeon: Burnell Blanks, MD;  Location: [place];  Service: Open Heart Surgery;  Laterality: N/A;  using Edwards Sapien 3 Transcatheter Heart Valve size 43mm    FAMILY HISTORY: Family History  Problem Relation Age of Onset  . Diabetes Mother   . Heart attack Mother   . Hypertension Mother   . Heart attack Father   . Heart failure Father   . Hypertension Father   . Diabetes Father   . Diabetes Sister   . Diabetes Brother   . Diabetes Other   . Diabetes Daughter        TYPE ll  . Heart Problems Daughter   . Hypertension Sister   . Hypertension Brother   . Stroke Brother     SOCIAL HISTORY: Social History   Socioeconomic History  . Marital status: Married    Spouse name: [name]  . Number of children: 1  . Years of education: 42  . Highest education level: Not on file  Occupational History  . Occupation: retired    Fish farm manager: [employer]  . Financial resource strain: Not on file  . Food insecurity:    Worry: Not on file    Inability: Not on file  . Transportation needs:    Medical: Not on file    Non-medical: Not on file  Tobacco Use  . Smoking status: Former Smoker     Packs/day: 2.00    Years: 14.00    Pack years: 28.00    Types: Cigarettes    Last attempt to quit: 11/11/1972    Years since quitting: 46.1  . Smokeless tobacco: Never Used  Substance and Sexual Activity  . Alcohol use: No    Comment: quit in 1984  . Drug use: No  . Sexual activity: Not Currently  Lifestyle  . Physical activity:    Days per week: 0 days    Minutes per session: 0 min  . Stress: Not at all  Relationships  . Social connections:    Talks on phone: Not on file    Gets together: Not on file    Attends religious service: Not on file    Active member of club or organization: Not on file    Attends meetings of clubs or organizations: Not on file    Relationship status: Not on file  . Intimate partner violence:    Fear of current or ex partner: Not on file    Emotionally abused: Not on file    Physically abused: Not on file    Forced sexual activity: Not on file  Other Topics Concern  . 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   Patient has one child and his wife has one child.   Patient is retired.   Patient has a high school education.   Patient is right-handed.   Patient drinks very little caffeine.  PHYSICAL EXAM  Vitals:   01/04/19 1356  BP: (!) 100/57  Pulse: (!) 59  Weight: 237 lb 3.2 oz (107.6 kg)  Height: 5\' 11"  (1.803 m)   Body mass index is 33.08 kg/m.  Generalized: Well developed, obese male in no acute distress  Head: normocephalic and atraumatic,. Oropharynx benign mallopatti 4 Neck: Supple, circumference 21 Lungs clear Skin no rash or edema Neurological examination   Mentation: Alert oriented to time, place, history taking. Attention span and concentration appropriate. Recent and remote memory intact.  Follows all commands speech and language fluent.   Cranial nerve II-XII: .Pupils were equal round reactive to light extraocular movements were full, visual field were  full on confrontational test. Facial sensation and strength were normal. hearing was intact to finger rubbing bilaterally. Uvula tongue midline. head turning and shoulder shrug were normal and symmetric.Tongue protrusion into cheek strength was normal. Motor: normal bulk and tone, full strength in the BUE, BLE, prosthesis left lower leg Sensory: normal and symmetric to light touch,  Coordination: finger-nose-finger, heel-to-shin bilaterally, no dysmetria Gait and Station: Ambulated a short distance in the hall with rolling walker no difficulty with turns DIAGNOSTIC DATA (LABS, IMAGING, TESTING) - I reviewed patient records, labs, notes, testing and imaging myself where available.  Lab Results  Component Value Date   WBC 6.7 05/31/2018   HGB 9.6 (L) 05/31/2018   HCT 30.3 (L) 05/31/2018   MCV 94.4 05/31/2018   PLT 271 05/31/2018      Component Value Date/Time   NA 139 08/18/2018 1500   NA 139 12/24/2017 1448   K 4.4 08/18/2018 1500   CL 106 08/18/2018 1500   CO2 25 08/18/2018 1500   GLUCOSE 135 (H) 08/18/2018 1500   BUN 38 (H) 08/18/2018 1500   BUN 20 12/24/2017 1448   CREATININE 1.93 (H) 08/18/2018 1500   CREATININE 1.75 (H) 10/13/2017 1224   CALCIUM 9.7 08/18/2018 1500   PROT 7.1 08/18/2018 1500   ALBUMIN 4.1 08/18/2018 1500   AST 26 08/18/2018 1500   ALT 35 08/18/2018 1500   ALKPHOS 27 (L) 08/18/2018 1500   BILITOT 0.3 08/18/2018 1500   GFRNONAA 37 (L) 05/30/2018 1429   GFRNONAA 38 (L) 10/13/2017 1224   GFRAA 42 (L) 05/30/2018 1429   GFRAA 43 (L) 10/13/2017 1224   Lab Results  Component Value Date   CHOL 131 08/18/2018   HDL 30.30 (L) 08/18/2018   LDLCALC 93 10/13/2017   LDLDIRECT 77.0 08/18/2018   TRIG 219.0 (H) 08/18/2018   CHOLHDL 4 08/18/2018   Lab Results  Component Value Date   HGBA1C 5.9 (A) 11/23/2018   No results found for: VITAMINB12 Lab Results  Component Value Date   TSH 2.48 08/18/2018      ASSESSMENT AND PLAN  78 y.o. year old male  has a  past medical history of obstructive sleep apnea on CPAP with new machine.Compliance data dated 12/05/2018 -01/03/2019 shows compliance greater than 4 hours at 60% for 18 days.    Average usage 6 hours 44 minutes . Set pressure 6 to 15 cm EPR level 2 leak 95th percentile 97 AHI 4.2 ESS 3..    CPAP compliance 60% greater than 4 hours Significant leak needs mask refit Continue same settings Follow-up in 6 months Dennie Bible, [facility], [facility], APRN   [facility] [address], [place], [place] [address] [phone]

## 2019-01-01 ENCOUNTER — Telehealth: Payer: Self-pay | Admitting: Endocrinology

## 2019-01-01 ENCOUNTER — Encounter: Payer: Medicare Other | Admitting: Physical Therapy

## 2019-01-01 NOTE — Telephone Encounter (Signed)
Can you please call and schedule a training with this pt?

## 2019-01-01 NOTE — Telephone Encounter (Signed)
Patient's wife stated they have received the patients new freestyle libra and they are trying to [place] it.. But the readings are a big difference  The patient would like to see if they could come in for training on how to read and use the libra,    Please advise

## 2019-01-03 IMAGING — DX DG CHEST 1V PORT
1 series · 1 of 1 positions shown · non-contrast
Comparison: 11/28/2017

CLINICAL DATA: Status post TAVR

EXAM:
PORTABLE CHEST 1 VIEW

[chest ap]
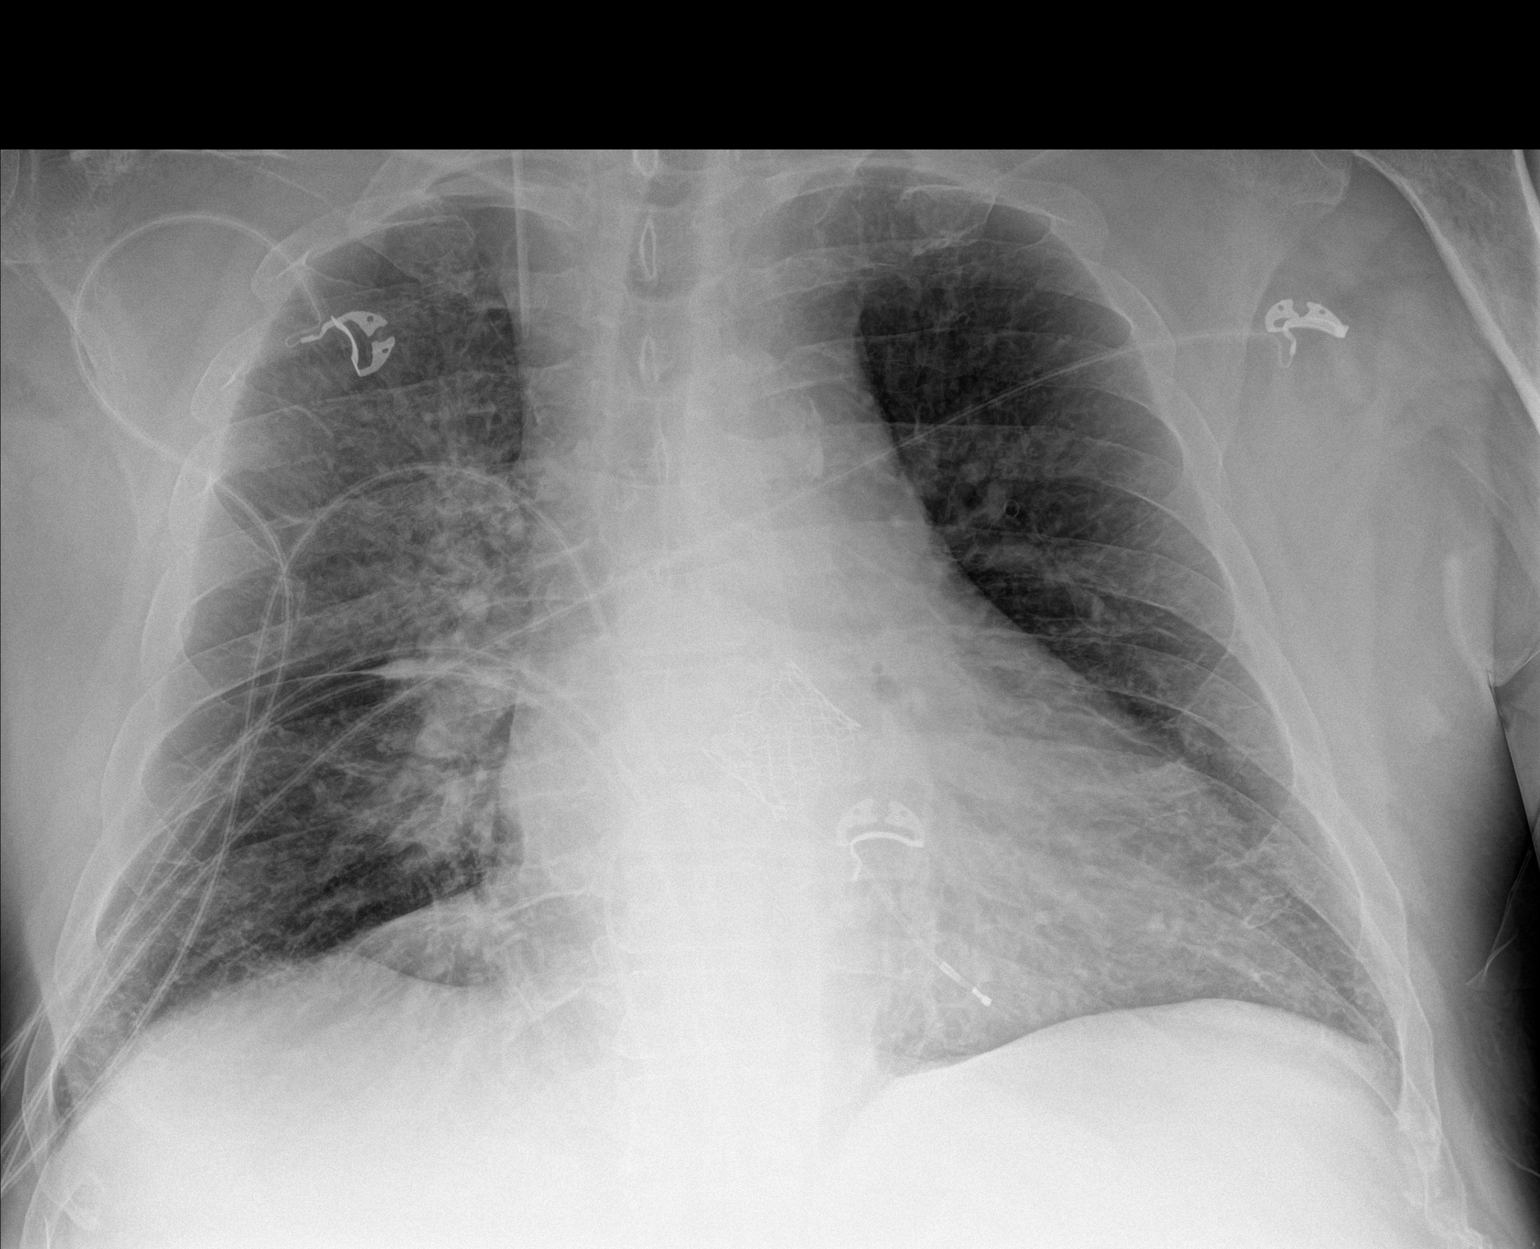

[1 of 1 positions shown; findings below may reference images not displayed]

FINDINGS: 5757 hours. The cardio pericardial silhouette is enlarged. Aortic
valve prosthesis noted. Right IJ central line tip is positioned in
or just proximal to the innominate vein confluence. There is some
patchy opacity in the right suprahilar lung and left base
potentially relating to atelectasis or asymmetric edema. No evidence
for pneumothorax or substantial pleural effusion. The visualized
bony structures of the thorax are intact. Telemetry leads overlie
the chest.
IMPRESSION: 1. Cardiomegaly with patchy opacity in the right suprahilar lung and
left base. This may be atelectasis or asymmetric edema.

## 2019-01-04 ENCOUNTER — Ambulatory Visit (INDEPENDENT_AMBULATORY_CARE_PROVIDER_SITE_OTHER): Payer: Medicare Other | Admitting: Nurse Practitioner

## 2019-01-04 ENCOUNTER — Telehealth: Payer: Self-pay | Admitting: Nutrition

## 2019-01-04 ENCOUNTER — Encounter: Payer: Self-pay | Admitting: Nurse Practitioner

## 2019-01-04 VITALS — BP 100/57 | HR 59 | Ht 71.0 in | Wt 237.2 lb

## 2019-01-04 DIAGNOSIS — Z9989 Dependence on other enabling machines and devices: Secondary | ICD-10-CM | POA: Diagnosis not present

## 2019-01-04 DIAGNOSIS — G4733 Obstructive sleep apnea (adult) (pediatric): Secondary | ICD-10-CM | POA: Diagnosis not present

## 2019-01-04 IMAGING — DX DG ABD PORTABLE 1V
1 series · 2 of 2 positions shown · non-contrast
Comparison: Abdominal CT angiogram October 31, 2017

CLINICAL DATA: Multiple vomiting episodes today.

EXAM:
PORTABLE ABDOMEN - 1 VIEW

[Series 1: abdomen · 0.14mm/px · 2 of 2 slices shown]
[im 1/2]
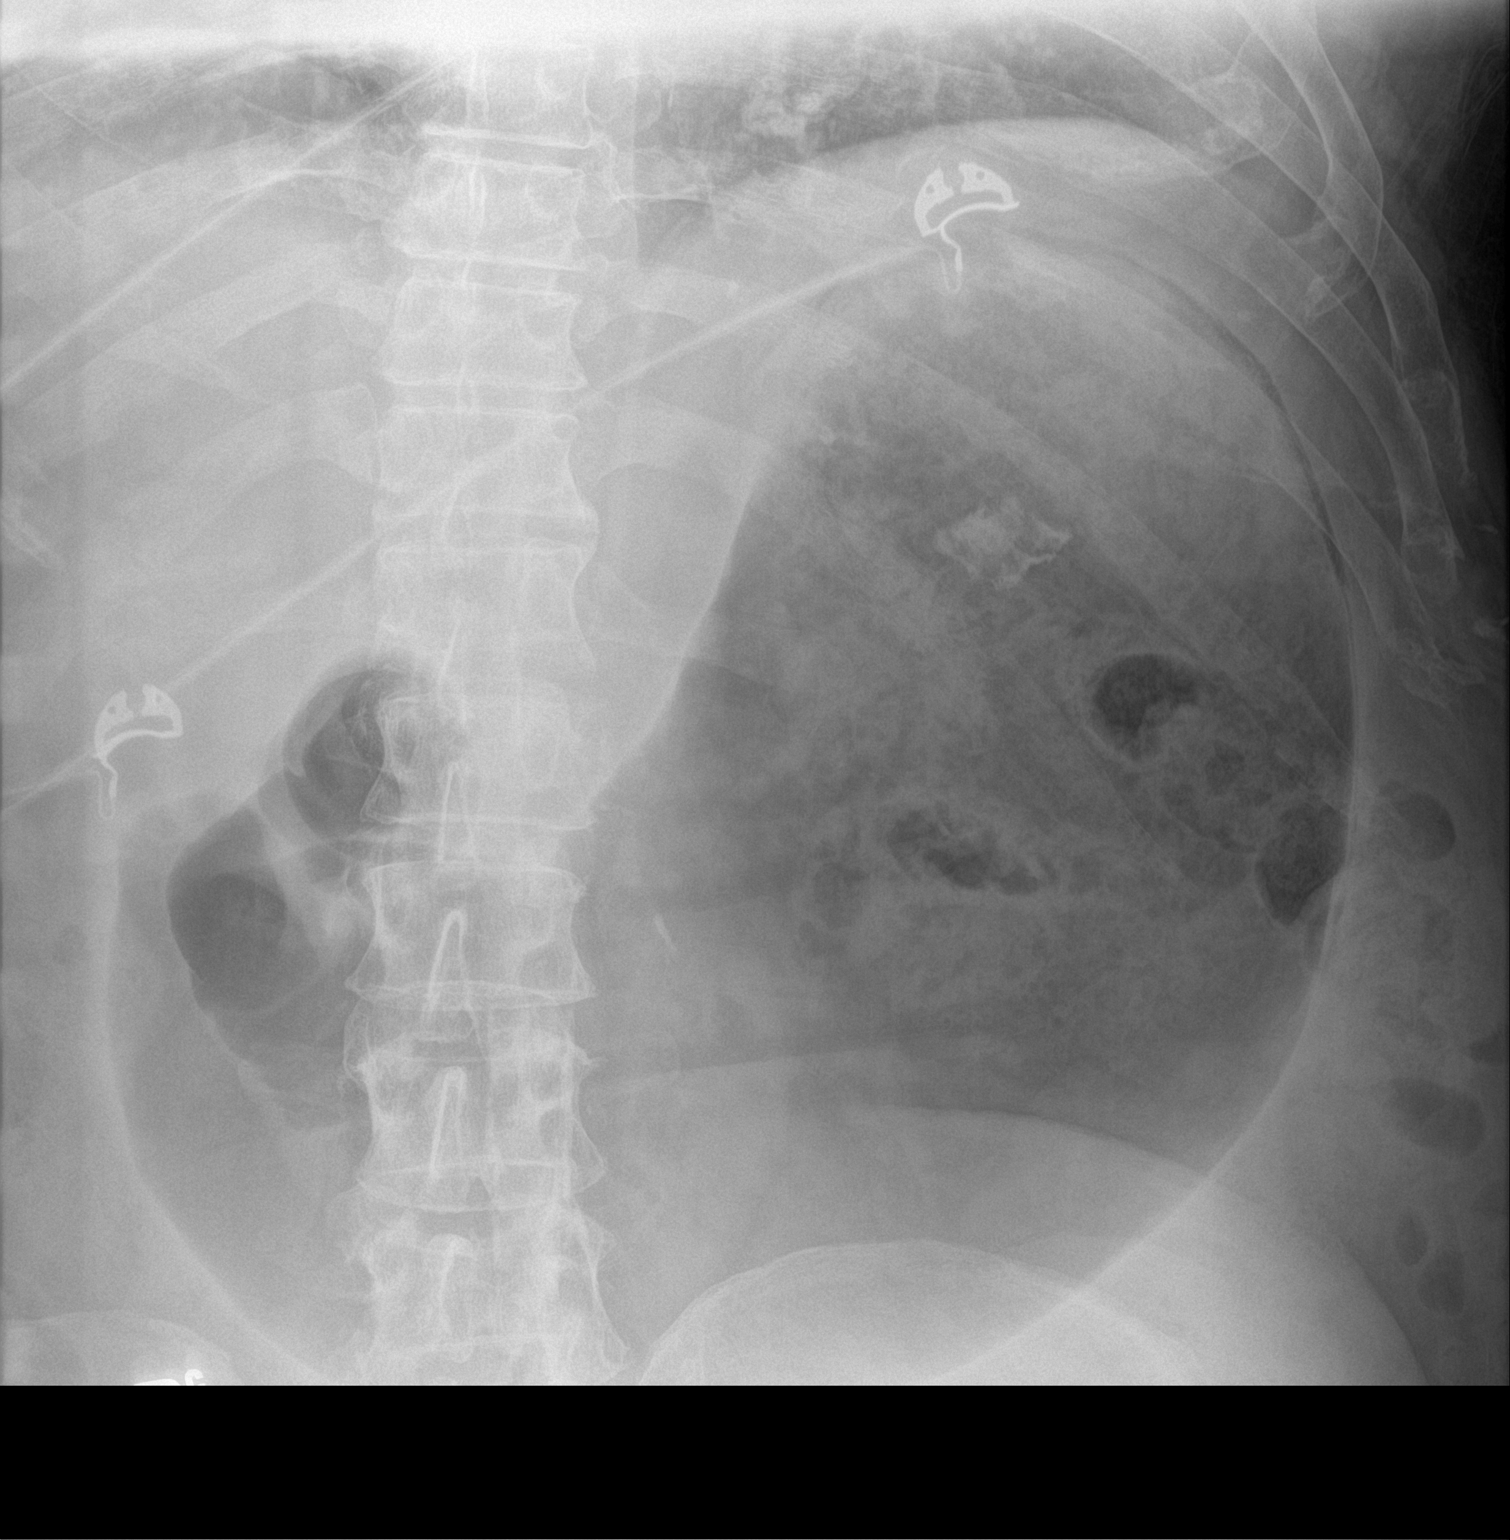
[im 2/2]
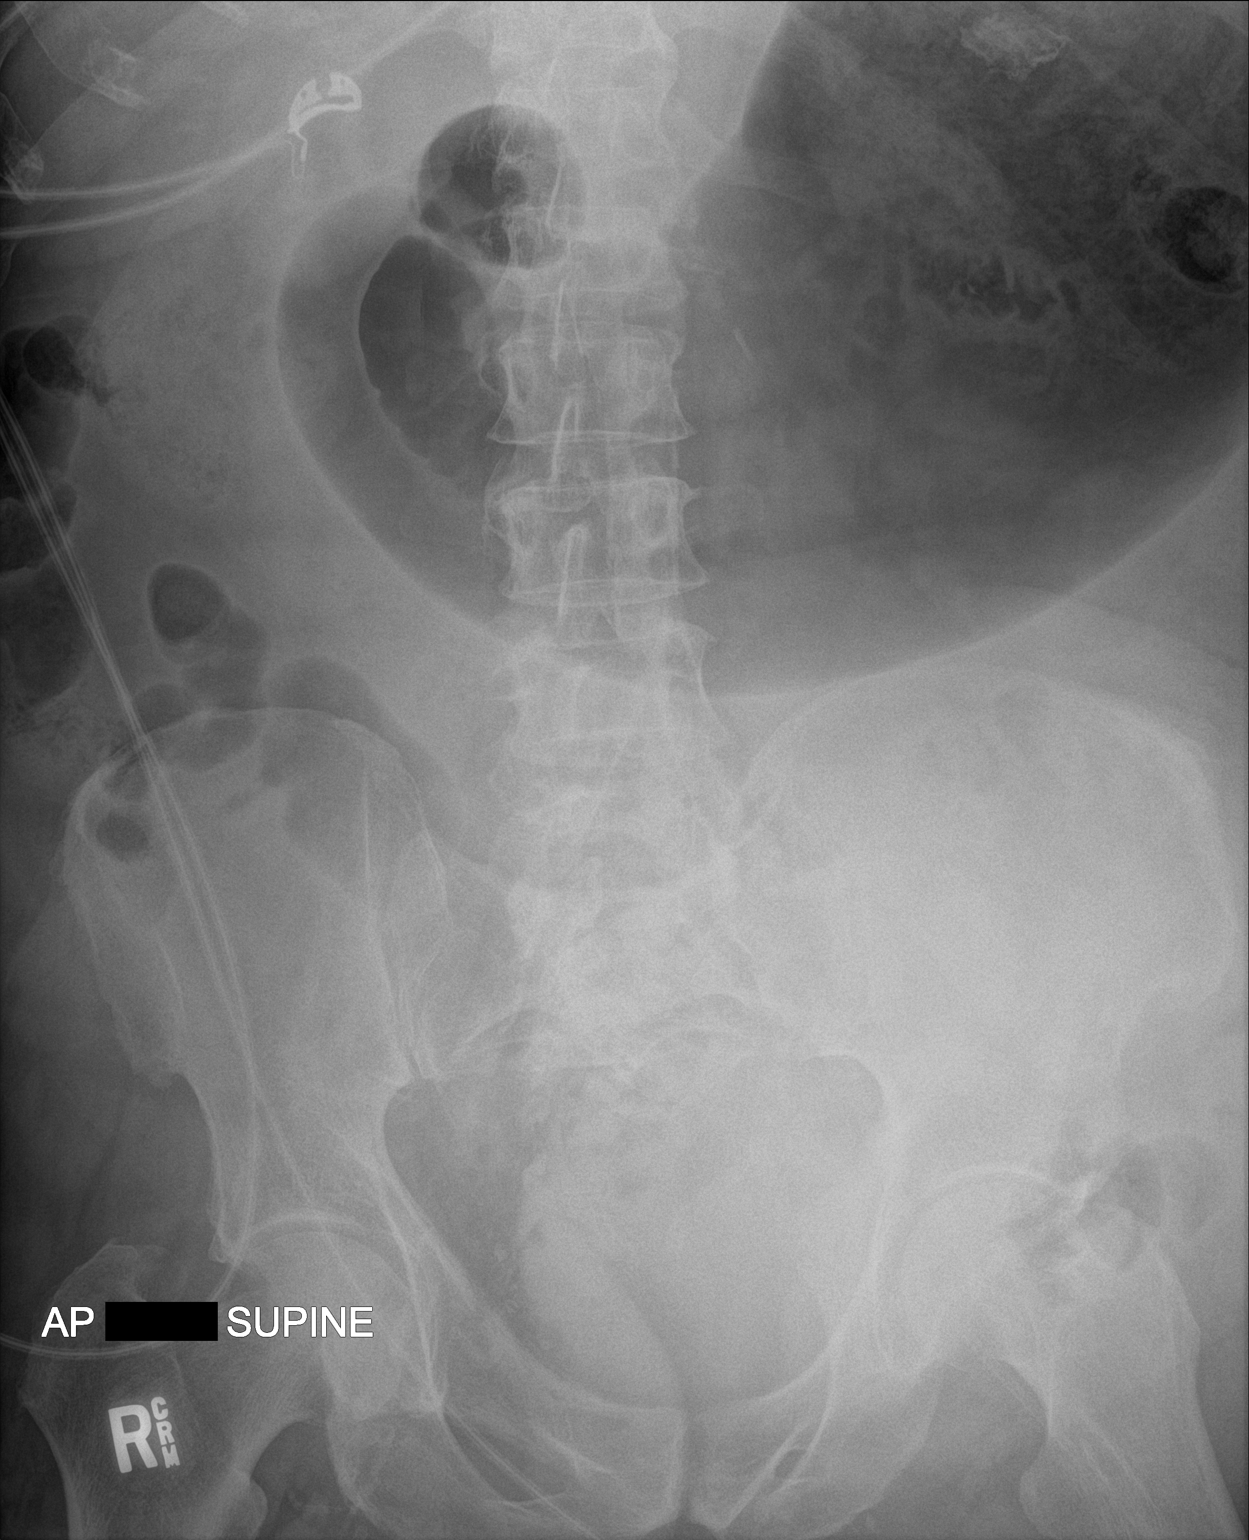

[2 of 2 positions shown; findings below may reference images not displayed]

FINDINGS: There new marked distention of the stomach with fluid and gas. There
is no evidence of a small bowel obstruction.
IMPRESSION: Distended stomach which may reflect gastroparesis or gastric outlet
obstruction. Nasogastric suction would be useful.

## 2019-01-04 NOTE — Telephone Encounter (Signed)
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

## 2019-01-04 NOTE — Patient Outreach (Addendum)
[facility]) Care Management  [phone] Late entry  Steven Maldonado 1940-12-18 [id number]  RN Health Coach telephone call to patient.  Hipaa compliance verified. RN spoke with patient wife. Patient blood sugar is 116. Patient appetite is good. A1C is 5.9. Patient is in outpatient rehab. Patient is eating a low carb diet. Patient is looking at getting the free style Libre. Patient and wife has agrees to follow up outreach calls.   Current Medications:  Current Outpatient Medications  Medication Sig Dispense Refill  . amiodarone (PACERONE) 200 MG tablet TAKE 1 TABLET BY MOUTH EVERY DAY 90 tablet 3  . atorvastatin (LIPITOR) 80 MG tablet Take 1 tablet (80 mg total) by mouth at bedtime. 90 tablet 3  . Blood Glucose Monitoring Suppl (FREESTYLE LITE) DEVI 1 each by Does not apply route 2 (two) times daily. 1 each 0  . Choline Fenofibrate (FENOFIBRIC ACID) 135 MG CPDR TAKE 1 CAPSULE DAILY 90 capsule 3  . dabigatran (PRADAXA) 150 MG CAPS capsule Take 150 mg by mouth every 12 (twelve) hours.     . docusate sodium (COLACE) 100 MG capsule Take 100 mg by mouth 2 (two) times daily.    . furosemide (LASIX) 20 MG tablet Take 1 tablet (20 mg total) by mouth daily. (Patient taking differently: Take 20 mg by mouth every Monday, Wednesday, and Friday. ) 90 tablet 3  . gabapentin (NEURONTIN) 300 MG capsule Take 1 capsule (300 mg total) by mouth 3 (three) times daily. 90 capsule 5  . glipiZIDE (GLUCOTROL XL) 10 MG 24 hr tablet TAKE 1 TABLET (10 MG TOTAL) BY MOUTH DAILY WITH BREAKFAST. 90 tablet 1  . glucose blood (FREESTYLE LITE) test strip 1 each by Other route 4 (four) times daily -  before meals and at bedtime. 450 each 3  . Insulin Degludec (TRESIBA FLEXTOUCH) 200 UNIT/ML SOPN Inject 66 Units into the skin at bedtime. (Patient taking differently: Inject 46 Units into the skin at bedtime. ) 5 pen 2  . insulin lispro (HUMALOG KWIKPEN) 100 UNIT/ML KwikPen Inject 5 units in the skin three times  daily with meals (Patient taking differently: Inject 5 Units into the skin 2 (two) times daily. Inject 5 units in the skin twice a day with breakfast and lunch.) 15 pen 2  . Insulin Pen Needle (BD PEN NEEDLE NANO U/F) 32G X 4 MM MISC USE 4 (FOUR) TIMES DAILY. 1000 each 0  . levothyroxine (SYNTHROID, LEVOTHROID) 175 MCG tablet TAKE 1 TABLET BY MOUTH EVERY DAY 90 tablet 1  . metoprolol tartrate (LOPRESSOR) 50 MG tablet TAKE ONE AND ONE-HALF TABLET BY MOUTH TWICE A DAY 270 tablet 2  . mirabegron ER (MYRBETRIQ) 25 MG TB24 tablet Take 1 tablet (25 mg total) by mouth daily. 90 tablet 3  . pentoxifylline (TRENTAL) 400 MG CR tablet Take 1 tablet (400 mg total) by mouth 3 (three) times daily with meals. 90 tablet 3   No current facility-administered medications for this visit.     Functional Status:  In your present state of health, do you have any difficulty performing the following activities: 06/29/2018  Hearing? N  Vision? N  Difficulty concentrating or making decisions? N  Walking or climbing stairs? Y  Comment bka , unable to use stairs   Dressing or bathing? Y  Comment wife assist   Doing errands, shopping? Y  Comment wife will Diplomatic Services operational officer and eating ? Y  Using the Toilet? Y  Comment wife assist   In  the past six months, have you accidently leaked urine? Y  Do you have problems with loss of bowel control? N  Managing your Medications? N  Managing your Finances? Y  Comment wife assist   Housekeeping or managing your Housekeeping? Y  Comment unable wife, does housekeeping   Some recent data might be hidden    Fall/Depression Screening: Fall Risk  12/14/2018 10/07/2018 07/28/2018  Falls in the past year? 1 0 Yes  Number falls in past yr: 0 - 1  Injury with Fall? 0 - No  Risk for fall due to : Impaired balance/gait;Impaired mobility;History of fall(s) - -  Follow up Falls evaluation completed;Falls prevention discussed - -   PHQ 2/9 Scores 12/14/2018 10/07/2018 07/28/2018  06/29/2018 01/12/2018 12/15/2017 10/13/2017  PHQ - 2 Score 0 0 0 0 0 0 0  PHQ- 9 Score - - - - - - -   THN CM Care Plan Problem One     Most Recent Value  Care Plan Problem One  Knowledge Deficit in Self Management of diabetes  Role Documenting the Problem One  [place] for Problem One  Active  THN Long Term Goal   Patient will Verbalize making appointments for Health Maintenance within the next 90 days  THN Long Term Goal Start Date  12/14/18  Interventions for Problem One Long Term Goal  RN discussed health maintenance eye exam, feet exam and follow up exams with PCP. RN will follow up with compliance  THN CM Short Term Goal #1   Patient will verbalize making healthier choices dining out within the next 30 days  THN CM Short Term Goal #1 Start Date  12/14/18  Interventions for Short Term Goal #1  RN discussed food choices from fast food services. RN sent patient a list of food choices from diabetic menu. RN will follow up for patient teceiving and questions  THN CM Short Term Goal #2   Patient will be available verbalize making better choices of snacks within the next 90 days  Interventions for Short Term Goal #2  RN discussed making Healthy snacks. RN sent a list of healthy snacks. RN will follow up with patient receiving list     Assessment:  A1C 5.9 FBS 116 Per patient wife he has lost a total of 30 lbs as part of treatment Patient is looking at getting the [place] Patient will benefit from [employer] telephonic outreach for education and support for diabetes self management.   Plan:  RN discussed Health Maintenance RN sent a 2020 Calendar Book RN sent educational material on Low Carb Snacks RN sent educational material on Dining out for Diabetics RN discussed Health Maintenance RN sent Taking care of yourself day to day RN sent Diabetes taking care of yourself throughout the year RN will follow up outreach within the month of May RN sent Barriers letter  and assessment to PCP  Steven Maldonado [place] [facility] [phone]

## 2019-01-04 NOTE — Progress Notes (Signed)
Fax confirmation to [facility] [phone] new cpap orders.

## 2019-01-04 NOTE — Patient Instructions (Signed)
CPAP compliance 66% greater than 4 hours Significant leak needs mask refit Continue same settings Follow-up in 6 months

## 2019-01-05 ENCOUNTER — Ambulatory Visit: Payer: Medicare Other | Admitting: Physical Therapy

## 2019-01-05 ENCOUNTER — Encounter: Payer: Self-pay | Admitting: Physical Therapy

## 2019-01-05 DIAGNOSIS — M6249 Contracture of muscle, multiple sites: Secondary | ICD-10-CM | POA: Diagnosis not present

## 2019-01-05 DIAGNOSIS — R293 Abnormal posture: Secondary | ICD-10-CM

## 2019-01-05 DIAGNOSIS — M6281 Muscle weakness (generalized): Secondary | ICD-10-CM | POA: Diagnosis not present

## 2019-01-05 DIAGNOSIS — R2681 Unsteadiness on feet: Secondary | ICD-10-CM

## 2019-01-05 DIAGNOSIS — R2689 Other abnormalities of gait and mobility: Secondary | ICD-10-CM | POA: Diagnosis not present

## 2019-01-05 IMAGING — DX DG CHEST 1V PORT
1 series · 1 of 1 positions shown · non-contrast
Comparison: Two days ago

CLINICAL DATA: Temporary pacer cleared

EXAM:
PORTABLE CHEST 1 VIEW

[chest ap]
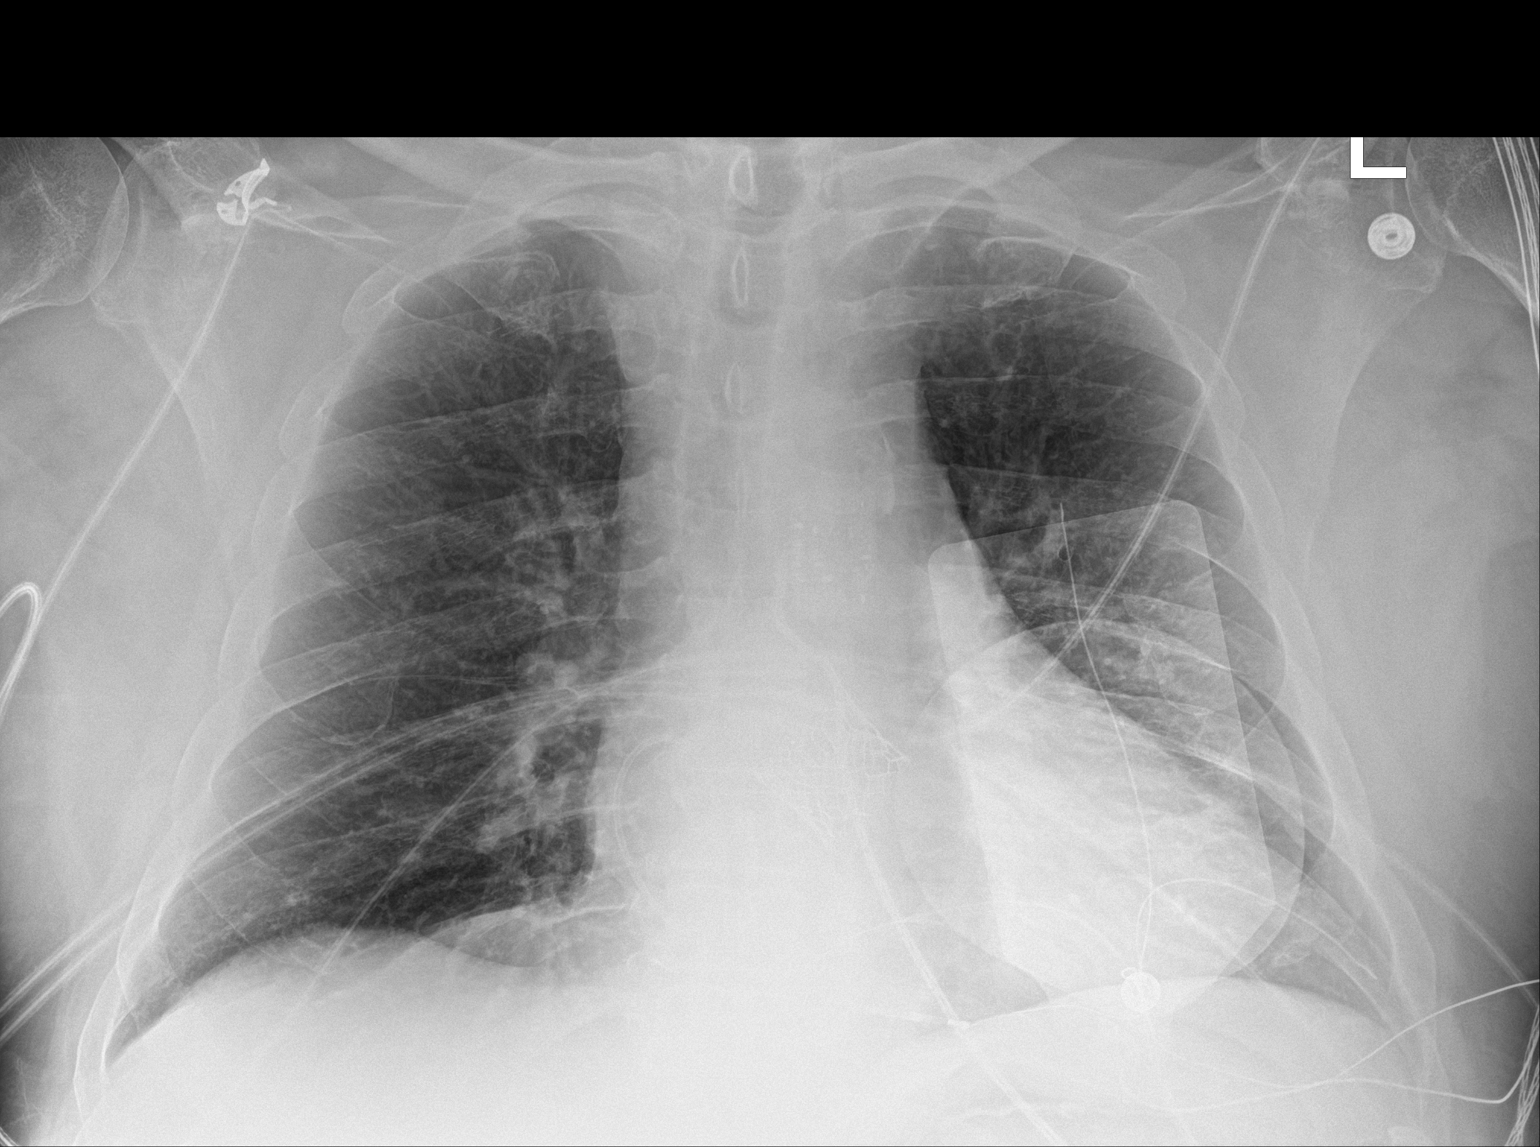

[1 of 1 positions shown; findings below may reference images not displayed]

FINDINGS: A temporary pacer lead from below continues to project over the
right ventricle. Status post transcatheter aortic valve replacement.
Chronic cardiomegaly. Pulmonary edema seen previously is resolved.
No air bronchogram, effusion, or pneumothorax. Artifact from EKG
leads and defibrillator pads.
IMPRESSION: 1. Temporary pacer wire projects over the right ventricle.
2. Pulmonary edema seen 2 days ago is resolved.
3. Stable cardiomegaly.

## 2019-01-05 NOTE — Telephone Encounter (Signed)
Opened in error

## 2019-01-05 NOTE — Telephone Encounter (Signed)
Patient called and said she can come in tomorrow at [facility].  Appointment scheduled

## 2019-01-06 ENCOUNTER — Encounter: Payer: Medicare Other | Attending: Endocrinology | Admitting: Nutrition

## 2019-01-06 DIAGNOSIS — E119 Type 2 diabetes mellitus without complications: Secondary | ICD-10-CM | POA: Insufficient documentation

## 2019-01-06 DIAGNOSIS — Z794 Long term (current) use of insulin: Secondary | ICD-10-CM | POA: Diagnosis not present

## 2019-01-06 IMAGING — CR DG CHEST 2V
2 series · 2 of 2 positions shown · non-contrast
Comparison: 12/04/2017

CLINICAL DATA: Pacemaker placement

EXAM:
CHEST  2 VIEW

[chest lat]
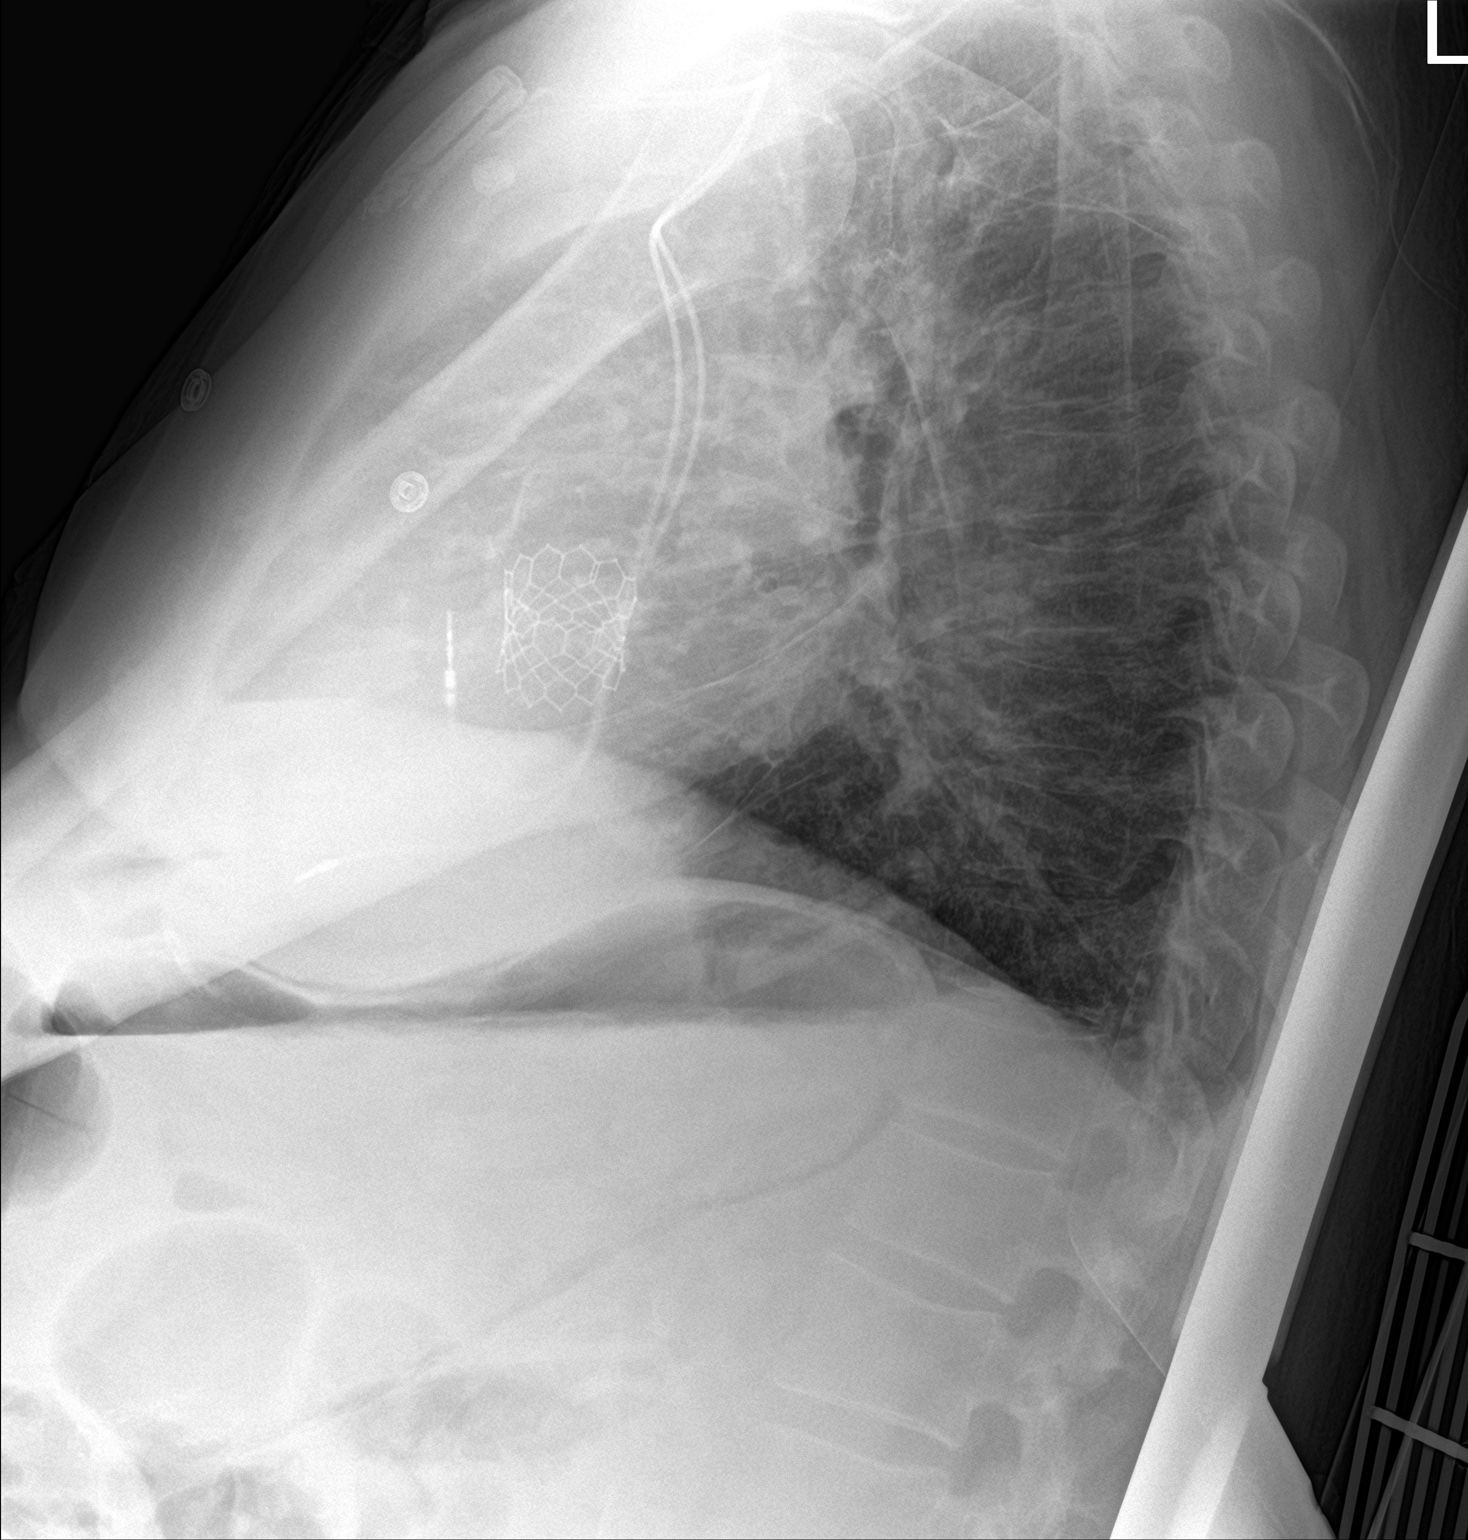

[chest ap]
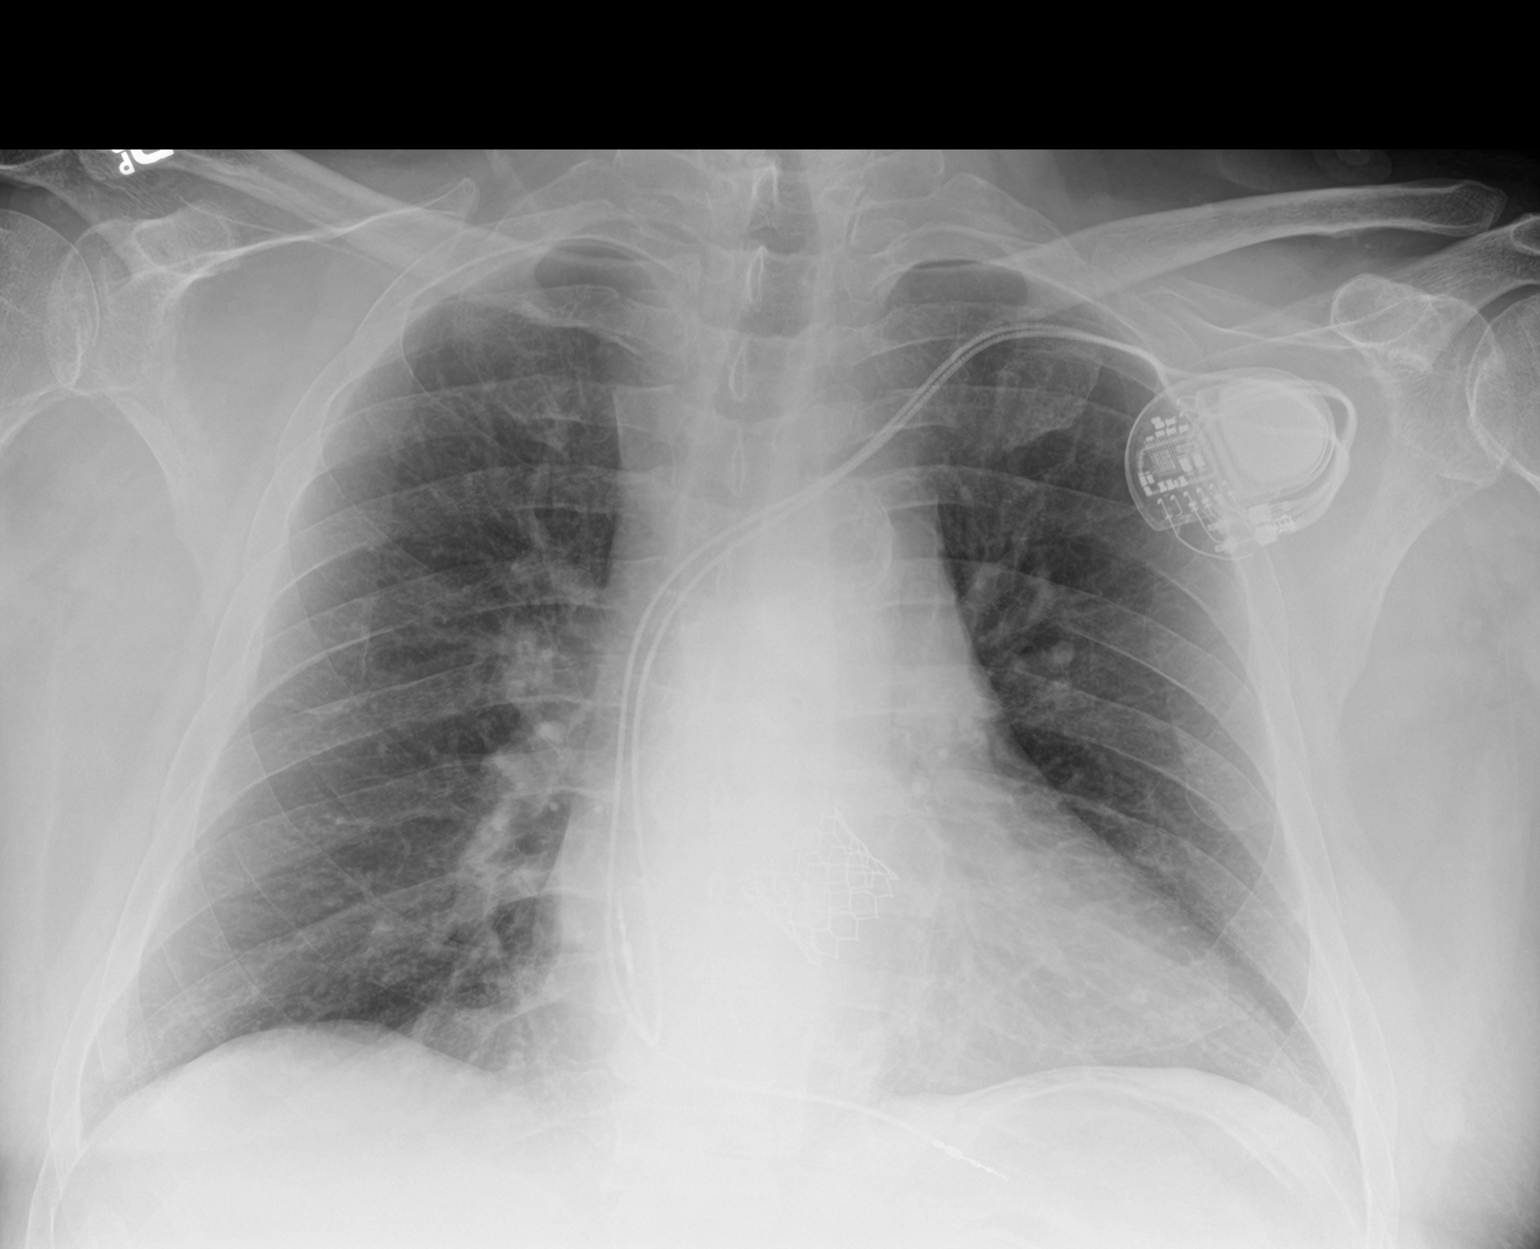

[2 of 2 positions shown; findings below may reference images not displayed]

FINDINGS: Interval placement of a LEFT subclavian sequential pacemaker with
leads projecting at RIGHT atrium and RIGHT ventricle.

Mild enlargement of cardiac silhouette post TAVR.

Lungs clear.

No pulmonary infiltrate, pleural effusion or pneumothorax.

Bones unremarkable.
IMPRESSION: New pacemaker without pneumothorax.

Mild enlargement of cardiac silhouette post TAVR.

## 2019-01-06 NOTE — Therapy (Signed)
[facility] [address] [place], [name], [address] Phone: [phone]   Fax:  [phone]  Physical Therapy Treatment  Patient Details  Name: Steven Maldonado MRN: [id number] Date of Birth: 1941/10/02 Referring Provider (PT): Mechele Claude, [place]   Encounter Date: 01/05/2019  PT End of Session - 01/05/19 1444    Visit Number  33    Number of Visits  50    Date for PT Re-Evaluation  02/26/19    Authorization Type  Medicare & Federal BCBS    Authorization Time Period  Medicare guidelines, Fed BCBS waves co-pay.  75 visit limit PT, OT & ST combined with 1 used.    Authorization - Visit Number  13    Authorization - Number of Visits  75    PT Start Time  2297    PT Stop Time  1400    PT Time Calculation (min)  45 min    Equipment Utilized During Treatment  Gait belt    Activity Tolerance  Patient tolerated treatment well;No increased pain;Patient limited by fatigue    Behavior During Therapy  [facility] for tasks assessed/performed       Past Medical History:  Diagnosis Date  . Chronic diastolic CHF (congestive heart failure) ([place])   . CKD (chronic kidney disease), stage III ([place])   . Constipation   . Coronary artery disease    a. Cath February 2012 in [place], occluded RCA with collaterals  . DM type 2 (diabetes mellitus, type 2) ([place])   . Essential hypertension   . Hyperlipidemia   . Neuropathy    feet  . Pacemaker    a. symptomatic brady after TAVR s/p MDT PPM by Dr. Curt Bears 12/04/17  . Persistent atrial fibrillation   . PONV (postoperative nausea and vomiting)    after valve surgery  . PVD (peripheral vascular disease) ([place])    a. s/p R popliteal artery stenosis tx with drug-coated balloon 05/2014, followed by Dr. Fletcher Anon.  . 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 Severe aortic stenosis    a. 12/02/17: s/p TAVR  . Skin cancer   . Sleep apnea with use of continuous positive airway pressure (CPAP)    04-11-11 AHI was 32.9 and titrated to 15 cm H20, DME is [facility]  . Subclinical hypothyroidism     Past Surgical History:  Procedure Laterality Date  . ABDOMINAL ANGIOGRAM N/A 06/08/2014   Procedure: ABDOMINAL ANGIOGRAM;  Surgeon: Wellington Hampshire, MD;  Location: [facility] CATH LAB;  Service: Cardiovascular;  Laterality: N/A;  . ABDOMINAL AORTOGRAM N/A 04/09/2018   Procedure: ABDOMINAL AORTOGRAM;  Surgeon: Conrad El [place], MD;  Location: [place] CV LAB;  Service: Cardiovascular;  Laterality: N/A;  . AMPUTATION Left 04/17/2018   Procedure: LEFT FOOT 3RD RAY AMPUTATION;  Surgeon: Newt Minion, MD;  Location: [place];  Service: Orthopedics;  Laterality: Left;  . AMPUTATION Left 05/28/2018   Procedure: LEFT AMPUTATION BELOW KNEE;  Surgeon: Wylene Simmer, MD;  Location: [place];  Service: Orthopedics;  Laterality: Left;  . APPENDECTOMY  1965  . 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 CARDIAC CATHETERIZATION  12/2010  . CARDIOVERSION  07/2011  . CARDIOVERSION N/A 04/18/2014   Procedure: CARDIOVERSION;  Surgeon: Dorothy Spark, MD;  Location: [facility] ENDOSCOPY;  Service: Cardiovascular;  Laterality: N/A;  . CARDIOVERSION N/A 11/03/2015   Procedure: CARDIOVERSION;  Surgeon: Lelon Perla, MD;  Location: [facility] ENDOSCOPY;  Service: Cardiovascular;  Laterality: N/A;  . CARDIOVERSION N/A 05/08/2017   Procedure: CARDIOVERSION;  Surgeon: Dorothy Spark, MD;  Location: [facility] ENDOSCOPY;  Service: Cardiovascular;  Laterality: N/A;  . CARDIOVERSION N/A 07/28/2017   Procedure: CARDIOVERSION;  Surgeon: Dorothy Spark, MD;  Location: [facility] [place];  Service: Cardiovascular;  Laterality: N/A;  . LOWER EXTREMITY ANGIOGRAM N/A 06/08/2014   Procedure: LOWER EXTREMITY ANGIOGRAM;  Surgeon: Wellington Hampshire, MD;  Location: [facility] CATH LAB;  Service: Cardiovascular;  Laterality: N/A;  . LOWER EXTREMITY ANGIOGRAPHY Left 04/09/2018    Procedure: Lower Extremity Angiography;  Surgeon: Conrad Morley, MD;  Location: [place] CV LAB;  Service: Cardiovascular;  Laterality: Left;  . PACEMAKER IMPLANT N/A 12/04/2017   Procedure: PACEMAKER IMPLANT;  Surgeon: Constance Haw, MD;  Location: [place] CV LAB;  Service: Cardiovascular;  Laterality: N/A;  . PERIPHERAL VASCULAR BALLOON ANGIOPLASTY Left 04/09/2018   Procedure: PERIPHERAL VASCULAR BALLOON ANGIOPLASTY;  Surgeon: Conrad [place], MD;  Location: [place] CV LAB;  Service: Cardiovascular;  Laterality: Left;  SFA  . POPLITEAL ARTERY ANGIOPLASTY Right 06/08/2014   Archie Endo 06/08/2014  . RIGHT/LEFT HEART CATH AND CORONARY ANGIOGRAPHY N/A 10/08/2017   Procedure: RIGHT/LEFT HEART CATH AND CORONARY ANGIOGRAPHY;  Surgeon: Burnell Blanks, MD;  Location: [place] CV LAB;  Service: Cardiovascular;  Laterality: N/A;  . SKIN CANCER EXCISION Bilateral    "have had them cut off back of neck X 2; off left upper arm; right wrist, near right shoulder blade" (06/08/2014)  . TEE WITHOUT CARDIOVERSION N/A 12/02/2017   Procedure: TRANSESOPHAGEAL ECHOCARDIOGRAM (TEE);  Surgeon: Burnell Blanks, MD;  Location: [facility];  Service: Open Heart Surgery;  Laterality: N/A;  . TEMPORARY PACEMAKER N/A 12/04/2017   Procedure: TEMPORARY PACEMAKER;  Surgeon: Leonie Man, MD;  Location: [place] CV LAB;  Service: Cardiovascular;  Laterality: N/A;  . TRANSCATHETER AORTIC VALVE REPLACEMENT, TRANSFEMORAL N/A 12/02/2017   Procedure: TRANSCATHETER AORTIC VALVE REPLACEMENT, TRANSFEMORAL;  Surgeon: Burnell Blanks, MD;  Location: [facility];  Service: Open Heart Surgery;  Laterality: N/A;  using Edwards Sapien 3 Transcatheter Heart Valve size 38m    There were no vitals filed for this visit.  Subjective Assessment - 01/05/19 1321    Subjective  No falls. He is wearing prosthesis daily most of awake hours. He is using ice massage 1x/day. His residual limb burns for ~5 minutes when he  sits down.     Pertinent History  L TTA, CAD, PAF,  pacemaker, DM2, neuropathy, CKD, HTN,     Limitations  Lifting;Standing;Walking;House hold activities    Patient Stated Goals  To use prosthesis in community, travel (uses vLucianne Lei, take of himself so wife does not have to do it. Housework.     Currently in Pain?  No/denies      Prosthetic Training: PT recommended using ice massage 2-3 times or every 5 hrs for 5-7 minutes to tender areas of limb and continue as long as he has limb tenderness.  PT adjusted cutoff sock over liner distally for limb volume. Length of his sock was too long and put edge / ridge over tibial plateau which is pressure intolerant area. PT instructed in location of cutoff sock under liner proximally. This cutoff sock should be above femoral condyles and part of it show above liner. PT instructed in sitting positioning with prosthetic foot in line with knee to limit external hip rotation which places pressure points on  limb. PT also instructed with demo in sitting with prosthesis supported including use of Yoga block or box and recliner with not allowing pylon & foot beyond leg support. If prosthesis is not supported, the weight pulls on limb which typically results in "burning" sensation. Pt & wife verbalized understanding of all of above.                           PT Short Term Goals - 01/05/19 1315      PT SHORT TERM GOAL #1   Title  Patient demonstrates proper donning without cues modified independent. (All updated STGs Target Date: 01/01/2019)    Baseline  MET 01/05/2019    Time  1    Period  Months    Status  Achieved    Target Date  01/01/19      PT SHORT TERM GOAL #2   Title  Patient tolerates prosthesis wear >12 hrs total/day with limb pain </= 2/10.     Baseline  MET 01/05/2019 no limb pain, has wound that is healing    Time  1    Period  Months    Status  Achieved    Target Date  01/01/19      PT SHORT TERM GOAL #3   Title  Patient  negotiates ramps & curbs with RW & prosthesis with supervision.     Baseline  MET 01/05/2019    Time  1    Period  Months    Status  Achieved    Target Date  01/01/19      PT SHORT TERM GOAL #4   Title  Patient ambulates 42' with cane & moderate hand hold assist.     Time  1    Period  Months    Status  On-going    Target Date  01/01/19        PT Short Term Goals - 01/06/19 1217      PT SHORT TERM GOAL #1   Title  Patient verbalizes proper sock management & skin management with no wounds on limb. (All updated STGs Target Date 01/29/2019)    Status  New    Target Date  01/29/19      PT SHORT TERM GOAL #2   Title  Patient tolerates prosthesis wear >12 hrs total/day with limb pain </= 2/10 & no wounds.     Status  New    Target Date  01/29/19      PT SHORT TERM GOAL #3   Title  Patient negotiates ramps & curbs with rollator walker & prosthesis with supervision.     Status  Revised    Target Date  01/29/19      PT SHORT TERM GOAL #4   Title  Patient ambulates 55' with cane & moderate hand hold assist.     Baseline        Status  Revised    Target Date  01/29/19        PT Long Term Goals - 12/01/18 1900      PT LONG TERM GOAL #1   Title  Patient verbalizes & demonstrates proper prosthetic care including donning / doffing independently to enable safe use of prosthesis (All LTGs Target Date: 02/26/2019)    Time  3    Period  Months    Status  On-going    Target Date  02/26/19      PT LONG TERM GOAL #2  Title  Patient tolerates prosthesis wear daily for >90% of awake hours without skin or limb pain issues to enable function throughout his day.     Time  3    Period  Months    Status  On-going    Target Date  02/26/19      PT LONG TERM GOAL #3   Title  Berg Balance >/= 45/56 to indicate lower fall risk & less dependency in standing ADLs.     Time  3    Period  Months    Status  Revised    Target Date  02/26/19      PT LONG TERM GOAL #4   Title  Patient ambulates  600' with RW (or rollator) & prosthesis outdoors on paved or grass up to 50' surfaces modified independent for community mobility.     Time  3    Period  Months    Status  Revised    Target Date  02/26/19      PT LONG TERM GOAL #5   Title  Patient negotiates ramps, curbs with walker & stairs 1 rail /cane modified independent for community access.     Time  3    Period  Months    Status  On-going    Target Date  02/26/19      Additional Long Term Goals   Additional Long Term Goals  Yes      PT LONG TERM GOAL #6   Title  Patient ambulates 39' around furniture with cane & prosthesis modified independent for household mobility.     Time  3    Period  Months    Status  New    Target Date  02/26/19            Plan - 01/05/19 1812    Clinical Impression Statement  Wife was present with patient today. Today's skilled session focused on educating patient & wife on sitting positioning to minimize pressure & skin traction pull from weight of prosthesis. PT also discussed cutoff sock length/location of ridge to skin issues. Patient & wife report better understanding.     Rehab Potential  Good    PT Frequency  2x / week    PT Duration  Other (comment)   90 days (13 weeks)   PT Treatment/Interventions  ADLs/Self Care Home Management;[place] Repostioning;DME Instruction;Gait training;Stair training;Functional mobility training;Therapeutic activities;Therapeutic exercise;Balance training;Neuromuscular re-education;Patient/family education;Prosthetic Training;Manual techniques;Vestibular    PT Next Visit Plan  work towards updated STGs, montior residual limb, continue to work on gait with cane for short distances, rollator for longer/community distances, balance reactions    Consulted and Agree with Plan of Care  Patient       Patient will benefit from skilled therapeutic intervention in order to improve the following deficits and impairments:  Abnormal gait, Decreased activity tolerance,  Decreased balance, Decreased endurance, Decreased knowledge of use of DME, Decreased mobility, Impaired flexibility, Decreased range of motion, Decreased strength, Decreased skin integrity, Dizziness, Postural dysfunction, Prosthetic Dependency  Visit Diagnosis: Muscle weakness (generalized)  Other abnormalities of gait and mobility  Abnormal posture  Unsteadiness on feet  Contracture of muscle, multiple sites     Problem List Patient Active Problem List   Diagnosis Date Noted  . Controlled type 2 diabetes mellitus without complication ([place]) 96/29/5284  . Bradycardia 09/01/2018  . Gangrene of left foot ([place]) 05/28/2018  . 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 Obstructive sleep apnea  syndrome 05/04/2018  . Obstructive sleep apnea treated with continuous positive airway pressure (CPAP) 05/04/2018  . Type II diabetes mellitus, uncontrolled ([place]) 04/22/2018  . Atherosclerosis of native arteries of the extremities with gangrene ([place]) 04/09/2018  . Atherosclerosis of native artery of extremity ([place]) 04/09/2018  . Cellulitis in diabetic foot ([place]) 04/04/2018  . Paroxysmal atrial fibrillation ([place]) 12/05/2017  . Cardiac pacemaker in situ   . Symptomatic bradycardia   . Asystole ([place])   . 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 History of aortic valve replacement 12/02/2017  . Severe aortic stenosis   . Bilateral impacted cerumen 01/22/2016  . Essential hypertension 11/14/2015  . Peripheral vascular disease ([place]) 05/24/2014  . Chronic kidney disease 08/30/2013  . Hypothyroidism 08/30/2013  . 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 Diabetes mellitus type 2, uncomplicated ([place])   . Aortic valve stenosis 07/25/2011  . Hyperlipidemia 01/10/2011  . CAD (coronary artery disease) 01/10/2011  . Coronary arteriosclerosis 01/10/2011    Jamey Reas PT, DPT 01/06/2019, 12:16 PM  [facility] [address] [place], [name], [address] Phone: [phone]   Fax:  [phone]  Name: BRICESON BROADWATER MRN: [id number] Date of Birth: January 04, 1941

## 2019-01-06 NOTE — Progress Notes (Signed)
Remote pacemaker transmission.   

## 2019-01-06 NOTE — Progress Notes (Signed)
Patient is here today to review the Libre CG.  He requested this meeting, because he did not understand the arrows, the difference between sensor and blood sugar readings.  This was all explained to him and he was shown how/when to test his blood sugar using the [place] meter.  He reported good understanding of when and how to use the blood sugar meter, and what the difference is between the two readings.  His Elenor Legato was downloaded and it showed 5 days worth of readings.  3 days his readings were in the 50-70s or below between 2 Am and 10AM. These readings were confirmed with finger sticks from his meter.   He is taking [place] 30u .  He was told to reduce this to 25u.  He reported good understanding of this, and had no final questions.

## 2019-01-06 NOTE — Patient Instructions (Signed)
Scan sensor at least every 8 hours.   When you see the magnifier symbol, test blood sugar.  Call if questions

## 2019-01-07 ENCOUNTER — Ambulatory Visit: Payer: Medicare Other | Admitting: Physical Therapy

## 2019-01-07 ENCOUNTER — Encounter: Payer: Self-pay | Admitting: Physical Therapy

## 2019-01-07 DIAGNOSIS — R2689 Other abnormalities of gait and mobility: Secondary | ICD-10-CM

## 2019-01-07 DIAGNOSIS — M6249 Contracture of muscle, multiple sites: Secondary | ICD-10-CM | POA: Diagnosis not present

## 2019-01-07 DIAGNOSIS — R293 Abnormal posture: Secondary | ICD-10-CM | POA: Diagnosis not present

## 2019-01-07 DIAGNOSIS — M6281 Muscle weakness (generalized): Secondary | ICD-10-CM | POA: Diagnosis not present

## 2019-01-07 DIAGNOSIS — R2681 Unsteadiness on feet: Secondary | ICD-10-CM

## 2019-01-07 NOTE — Therapy (Signed)
[facility] [address] [place], [name], [address] Phone: [phone]   Fax:  [phone]  Physical Therapy Treatment  Patient Details  Name: Steven Maldonado MRN: [id number] Date of Birth: Dec 25, 1940 Referring Provider (PT): Mechele Claude, [place]   Encounter Date: 01/07/2019  PT End of Session - 01/07/19 1409    Visit Number  34    Number of Visits  50    Date for PT Re-Evaluation  02/26/19    Authorization Type  Medicare & Federal BCBS    Authorization Time Period  Medicare guidelines, Fed BCBS waves co-pay.  75 visit limit PT, OT & ST combined with 1 used.    Authorization - Visit Number  14    Authorization - Number of Visits  75    PT Start Time  1660    PT Stop Time  1400    PT Time Calculation (min)  45 min    Equipment Utilized During Treatment  Gait belt    Activity Tolerance  Patient tolerated treatment well;No increased pain;Patient limited by fatigue    Behavior During Therapy  [facility] for tasks assessed/performed       Past Medical History:  Diagnosis Date  . Chronic diastolic CHF (congestive heart failure) ([place])   . CKD (chronic kidney disease), stage III ([place])   . Constipation   . Coronary artery disease    a. Cath February 2012 in [facility], occluded RCA with collaterals  . DM type 2 (diabetes mellitus, type 2) ([place])   . Essential hypertension   . Hyperlipidemia   . Neuropathy    feet  . Pacemaker    a. symptomatic brady after TAVR s/p MDT PPM by Dr. Curt Bears 12/04/17  . Persistent atrial fibrillation   . PONV (postoperative nausea and vomiting)    after valve surgery  . PVD (peripheral vascular disease) ([place])    a. s/p R popliteal artery stenosis tx with drug-coated balloon 05/2014, followed by Dr. Fletcher Anon.  . 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 Severe aortic stenosis    a. 12/02/17: s/p TAVR  . Skin cancer   . Sleep apnea with use of continuous positive airway pressure (CPAP)    04-11-11 AHI was 32.9 and titrated to 15 cm H20, DME is [facility]  . Subclinical hypothyroidism     Past Surgical History:  Procedure Laterality Date  . ABDOMINAL ANGIOGRAM N/A 06/08/2014   Procedure: ABDOMINAL ANGIOGRAM;  Surgeon: Wellington Hampshire, MD;  Location: [facility] CATH LAB;  Service: Cardiovascular;  Laterality: N/A;  . ABDOMINAL AORTOGRAM N/A 04/09/2018   Procedure: ABDOMINAL AORTOGRAM;  Surgeon: Conrad Hallam, MD;  Location: [facility] CV LAB;  Service: Cardiovascular;  Laterality: N/A;  . AMPUTATION Left 04/17/2018   Procedure: LEFT FOOT 3RD RAY AMPUTATION;  Surgeon: Newt Minion, MD;  Location: [facility];  Service: Orthopedics;  Laterality: Left;  . AMPUTATION Left 05/28/2018   Procedure: LEFT AMPUTATION BELOW KNEE;  Surgeon: Wylene Simmer, MD;  Location: [facility];  Service: Orthopedics;  Laterality: Left;  . APPENDECTOMY  1965  . 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 CARDIAC CATHETERIZATION  12/2010  . CARDIOVERSION  07/2011  . CARDIOVERSION N/A 04/18/2014   Procedure: CARDIOVERSION;  Surgeon: Dorothy Spark, MD;  Location: [facility] ENDOSCOPY;  Service: Cardiovascular;  Laterality: N/A;  . CARDIOVERSION N/A 11/03/2015   Procedure: CARDIOVERSION;  Surgeon: Lelon Perla, MD;  Location: [facility] ENDOSCOPY;  Service: Cardiovascular;  Laterality: N/A;  . CARDIOVERSION N/A 05/08/2017   Procedure: CARDIOVERSION;  Surgeon: Dorothy Spark, MD;  Location: [facility] ENDOSCOPY;  Service: Cardiovascular;  Laterality: N/A;  . CARDIOVERSION N/A 07/28/2017   Procedure: CARDIOVERSION;  Surgeon: Dorothy Spark, MD;  Location: [facility];  Service: Cardiovascular;  Laterality: N/A;  . LOWER EXTREMITY ANGIOGRAM N/A 06/08/2014   Procedure: LOWER EXTREMITY ANGIOGRAM;  Surgeon: Wellington Hampshire, MD;  Location: [facility] CATH LAB;  Service: Cardiovascular;  Laterality: N/A;  . LOWER EXTREMITY ANGIOGRAPHY Left 04/09/2018    Procedure: Lower Extremity Angiography;  Surgeon: Conrad Garceno, MD;  Location: [place] CV LAB;  Service: Cardiovascular;  Laterality: Left;  . PACEMAKER IMPLANT N/A 12/04/2017   Procedure: PACEMAKER IMPLANT;  Surgeon: Constance Haw, MD;  Location: [place] CV LAB;  Service: Cardiovascular;  Laterality: N/A;  . PERIPHERAL VASCULAR BALLOON ANGIOPLASTY Left 04/09/2018   Procedure: PERIPHERAL VASCULAR BALLOON ANGIOPLASTY;  Surgeon: Conrad [place] Roberts, MD;  Location: [place] CV LAB;  Service: Cardiovascular;  Laterality: Left;  SFA  . POPLITEAL ARTERY ANGIOPLASTY Right 06/08/2014   Archie Endo 06/08/2014  . RIGHT/LEFT HEART CATH AND CORONARY ANGIOGRAPHY N/A 10/08/2017   Procedure: RIGHT/LEFT HEART CATH AND CORONARY ANGIOGRAPHY;  Surgeon: Burnell Blanks, MD;  Location: [place] [facility] CV LAB;  Service: Cardiovascular;  Laterality: N/A;  . SKIN CANCER EXCISION Bilateral    "have had them cut off back of neck X 2; off left upper arm; right wrist, near right shoulder blade" (06/08/2014)  . TEE WITHOUT CARDIOVERSION N/A 12/02/2017   Procedure: TRANSESOPHAGEAL ECHOCARDIOGRAM (TEE);  Surgeon: Burnell Blanks, MD;  Location: [facility];  Service: Open Heart Surgery;  Laterality: N/A;  . TEMPORARY PACEMAKER N/A 12/04/2017   Procedure: TEMPORARY PACEMAKER;  Surgeon: Leonie Man, MD;  Location: [facility] CV LAB;  Service: Cardiovascular;  Laterality: N/A;  . TRANSCATHETER AORTIC VALVE REPLACEMENT, TRANSFEMORAL N/A 12/02/2017   Procedure: TRANSCATHETER AORTIC VALVE REPLACEMENT, TRANSFEMORAL;  Surgeon: Burnell Blanks, MD;  Location: [facility];  Service: Open Heart Surgery;  Laterality: N/A;  using Edwards Sapien 3 Transcatheter Heart Valve size 4mm    There were no vitals filed for this visit.  Subjective Assessment - 01/07/19 1315    Subjective  He has been positioning prosthesis as PT instructed & it helps.     Pertinent History  L TTA, CAD, PAF,  pacemaker, DM2, neuropathy, CKD, HTN,      Limitations  Lifting;Standing;Walking;House hold activities    Patient Stated Goals  To use prosthesis in community, travel (uses [employer]), take of himself so wife does not have to do it. Housework.     Currently in Pain?  No/denies                       [facility] Adult PT Treatment/Exercise - 01/07/19 1315      Transfers   Transfers  Sit to Stand;Stand to Sit    Sit to Stand  6: Modified independent (Device/Increase time);With upper extremity assist;With armrests;From chair/3-in-1   to cane   Stand to Sit  6: Modified independent (Device/Increase time);With upper extremity assist;With armrests;To chair/3-in-1   from cane     Ambulation/Gait   Ambulation/Gait  Yes    Ambulation/Gait Assistance  4: Min assist    Ambulation Distance (Feet)  50 Feet   50' X 2   Assistive device  Prosthesis;Straight cane   cane with quad  tip   Gait Pattern  Step-through pattern;Decreased stride length;Decreased trunk rotation;Trunk flexed    Ambulation Surface  Indoor;Level      Prosthetics   Current prosthetic wear tolerance (days/week)   daily     Current prosthetic wear tolerance (#hours/day)   6 hrs 2x/day    Residual limb condition   wound on lateral tibial plateau 33mm covered Tegaderm,     Education Provided  Residual limb care;Skin check;Proper wear schedule/adjustment;Proper weight-bearing schedule/adjustment;Proper Donning    Person(s) Educated  Patient    Education Method  Explanation;Verbal cues    Education Method  Verbalized understanding;Verbal cues required;Needs further instruction    Donning Prosthesis  Supervision    Doffing Prosthesis  Modified independent (device/increased time)          Balance Exercises - 01/07/19 1315      Balance Exercises: Standing   Rockerboard  Anterior/posterior;Lateral;Head turns;EO;Intermittent UE support   weight shifts/ stabilization/recovery, PT manual cues   Other Standing Exercises  standing with feet apart red theraband  alternate UE & BUEs 10 reps ea rowing & forward press          PT Short Term Goals - 01/06/19 1217      PT SHORT TERM GOAL #1   Title  Patient verbalizes proper sock management & skin management with no wounds on limb. (All updated STGs Target Date 01/29/2019)    Status  New    Target Date  01/29/19      PT SHORT TERM GOAL #2   Title  Patient tolerates prosthesis wear >12 hrs total/day with limb pain </= 2/10 & no wounds.     Status  New    Target Date  01/29/19      PT SHORT TERM GOAL #3   Title  Patient negotiates ramps & curbs with rollator walker & prosthesis with supervision.     Status  Revised    Target Date  01/29/19      PT SHORT TERM GOAL #4   Title  Patient ambulates 64' with cane & moderate hand hold assist.     Baseline        Status  Revised    Target Date  01/29/19        PT Long Term Goals - 12/01/18 1900      PT LONG TERM GOAL #1   Title  Patient verbalizes & demonstrates proper prosthetic care including donning / doffing independently to enable safe use of prosthesis (All LTGs Target Date: 02/26/2019)    Time  3    Period  Months    Status  On-going    Target Date  02/26/19      PT LONG TERM GOAL #2   Title  Patient tolerates prosthesis wear daily for >90% of awake hours without skin or limb pain issues to enable function throughout his day.     Time  3    Period  Months    Status  On-going    Target Date  02/26/19      PT LONG TERM GOAL #3   Title  Berg Balance >/= 45/56 to indicate lower fall risk & less dependency in standing ADLs.     Time  3    Period  Months    Status  Revised    Target Date  02/26/19      PT LONG TERM GOAL #4   Title  Patient ambulates 600' with RW (or rollator) & prosthesis outdoors on paved or grass  up to 50' surfaces modified independent for community mobility.     Time  3    Period  Months    Status  Revised    Target Date  02/26/19      PT LONG TERM GOAL #5   Title  Patient negotiates ramps, curbs with walker  & stairs 1 rail /cane modified independent for community access.     Time  3    Period  Months    Status  On-going    Target Date  02/26/19      Additional Long Term Goals   Additional Long Term Goals  Yes      PT LONG TERM GOAL #6   Title  Patient ambulates 68' around furniture with cane & prosthesis modified independent for household mobility.     Time  3    Period  Months    Status  New    Target Date  02/26/19            Plan - 01/07/19 1725    Clinical Impression Statement  Today's skilled session focused on balance reactions & stabilization. He required tactile / manual cues for balance reactions.     Rehab Potential  Good    PT Frequency  2x / week    PT Duration  Other (comment)   90 days (13 weeks)   PT Treatment/Interventions  ADLs/Self Care Home Management;Canalith Repostioning;DME Instruction;Gait training;Stair training;Functional mobility training;Therapeutic activities;Therapeutic exercise;Balance training;Neuromuscular re-education;Patient/family education;Prosthetic Training;Manual techniques;Vestibular    PT Next Visit Plan  work towards updated STGs, montior residual limb, continue to work on gait with cane for short distances, rollator for longer/community distances, balance reactions    Consulted and Agree with Plan of Care  Patient       Patient will benefit from skilled therapeutic intervention in order to improve the following deficits and impairments:  Abnormal gait, Decreased activity tolerance, Decreased balance, Decreased endurance, Decreased knowledge of use of DME, Decreased mobility, Impaired flexibility, Decreased range of motion, Decreased strength, Decreased skin integrity, Dizziness, Postural dysfunction, Prosthetic Dependency  Visit Diagnosis: Muscle weakness (generalized)  Other abnormalities of gait and mobility  Abnormal posture  Unsteadiness on feet     Problem List Patient Active Problem List   Diagnosis Date Noted  .  Controlled type 2 diabetes mellitus without complication ([place]) 18/56/3149  . Bradycardia 09/01/2018  . Gangrene of left foot ([place]) 05/28/2018  . 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 Obstructive sleep apnea syndrome 05/04/2018  . Obstructive sleep apnea treated with continuous positive airway pressure (CPAP) 05/04/2018  . Type II diabetes mellitus, uncontrolled ([place]) 04/22/2018  . Atherosclerosis of native arteries of the extremities with gangrene ([place]) 04/09/2018  . Atherosclerosis of native artery of extremity ([place]) 04/09/2018  . Cellulitis in diabetic foot ([place]) 04/04/2018  . Paroxysmal atrial fibrillation ([place]) 12/05/2017  . Cardiac pacemaker in situ   . Symptomatic bradycardia   . Asystole ([place])   . 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 History of aortic valve replacement 12/02/2017  . Severe aortic stenosis   . Bilateral impacted cerumen 01/22/2016  . Essential hypertension 11/14/2015  . Peripheral vascular disease ([place]) 05/24/2014  . Chronic kidney disease 08/30/2013  . Hypothyroidism 08/30/2013  . 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 Diabetes mellitus type 2, uncomplicated ([place])   . Aortic valve stenosis 07/25/2011  . Hyperlipidemia 01/10/2011  . CAD (coronary artery disease) 01/10/2011  .  Coronary arteriosclerosis 01/10/2011    Jamey Reas PT, DPT 01/07/2019, 5:27 PM  [facility] [address] [place], [name], [address] Phone: [phone]   Fax:  [phone]  Name: AHMARI DUERSON MRN: [id number] Date of Birth: 02-12-1941

## 2019-01-11 ENCOUNTER — Ambulatory Visit: Payer: Medicare Other | Admitting: Physician Assistant

## 2019-01-12 ENCOUNTER — Encounter: Payer: Self-pay | Admitting: Physical Therapy

## 2019-01-12 ENCOUNTER — Ambulatory Visit: Payer: Medicare Other | Attending: Student | Admitting: Physical Therapy

## 2019-01-12 ENCOUNTER — Other Ambulatory Visit: Payer: Self-pay

## 2019-01-12 DIAGNOSIS — Z48812 Encounter for surgical aftercare following surgery on the circulatory system: Secondary | ICD-10-CM

## 2019-01-12 DIAGNOSIS — M6249 Contracture of muscle, multiple sites: Secondary | ICD-10-CM | POA: Insufficient documentation

## 2019-01-12 DIAGNOSIS — Z9181 History of falling: Secondary | ICD-10-CM | POA: Insufficient documentation

## 2019-01-12 DIAGNOSIS — R2681 Unsteadiness on feet: Secondary | ICD-10-CM

## 2019-01-12 DIAGNOSIS — M6281 Muscle weakness (generalized): Secondary | ICD-10-CM | POA: Insufficient documentation

## 2019-01-12 DIAGNOSIS — R293 Abnormal posture: Secondary | ICD-10-CM | POA: Insufficient documentation

## 2019-01-12 DIAGNOSIS — R2689 Other abnormalities of gait and mobility: Secondary | ICD-10-CM | POA: Diagnosis not present

## 2019-01-12 DIAGNOSIS — I70262 Atherosclerosis of native arteries of extremities with gangrene, left leg: Secondary | ICD-10-CM

## 2019-01-12 DIAGNOSIS — I739 Peripheral vascular disease, unspecified: Secondary | ICD-10-CM

## 2019-01-12 DIAGNOSIS — I70263 Atherosclerosis of native arteries of extremities with gangrene, bilateral legs: Secondary | ICD-10-CM

## 2019-01-12 NOTE — Therapy (Signed)
[facility] [address] [place], [name], [address] Phone: [phone]   Fax:  [phone]  Physical Therapy Treatment  Patient Details  Name: Steven Maldonado MRN: [id number] Date of Birth: 1941/06/03 Referring Provider (PT): Mechele Claude, [place]   Encounter Date: 01/12/2019  PT End of Session - 01/12/19 1410    Visit Number  35    Number of Visits  50    Date for PT Re-Evaluation  02/26/19    Authorization Type  Medicare & Federal BCBS    Authorization Time Period  Medicare guidelines, Fed BCBS waves co-pay.  75 visit limit PT, OT & ST combined with 1 used.    Authorization - Visit Number  15    Authorization - Number of Visits  75    PT Start Time  1400    PT Stop Time  1445    PT Time Calculation (min)  45 min    Equipment Utilized During Treatment  Gait belt    Activity Tolerance  Patient tolerated treatment well;No increased pain;Patient limited by fatigue    Behavior During Therapy  [facility] for tasks assessed/performed       Past Medical History:  Diagnosis Date  . Chronic diastolic CHF (congestive heart failure) ([place])   . CKD (chronic kidney disease), stage III ([place])   . Constipation   . Coronary artery disease    a. Cath February 2012 in [place], occluded RCA with collaterals  . DM type 2 (diabetes mellitus, type 2) ([place])   . Essential hypertension   . Hyperlipidemia   . Neuropathy    feet  . Pacemaker    a. symptomatic brady after TAVR s/p MDT PPM by Dr. Curt Bears 12/04/17  . Persistent atrial fibrillation   . PONV (postoperative nausea and vomiting)    after valve surgery  . PVD (peripheral vascular disease) ([place])    a. s/p R popliteal artery stenosis tx with drug-coated balloon 05/2014, followed by Dr. Fletcher Anon.  . 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 Severe aortic stenosis     a. 12/02/17: s/p TAVR  . Skin cancer   . Sleep apnea with use of continuous positive airway pressure (CPAP)    04-11-11 AHI was 32.9 and titrated to 15 cm H20, DME is [facility]  . Subclinical hypothyroidism     Past Surgical History:  Procedure Laterality Date  . ABDOMINAL ANGIOGRAM N/A 06/08/2014   Procedure: ABDOMINAL ANGIOGRAM;  Surgeon: Wellington Hampshire, MD;  Location: [facility] CATH LAB;  Service: Cardiovascular;  Laterality: N/A;  . ABDOMINAL AORTOGRAM N/A 04/09/2018   Procedure: ABDOMINAL AORTOGRAM;  Surgeon: Conrad [place], MD;  Location: [place] CV LAB;  Service: Cardiovascular;  Laterality: N/A;  . AMPUTATION Left 04/17/2018   Procedure: LEFT FOOT 3RD RAY AMPUTATION;  Surgeon: Newt Minion, MD;  Location: [place];  Service: Orthopedics;  Laterality: Left;  . AMPUTATION Left 05/28/2018   Procedure: LEFT AMPUTATION BELOW KNEE;  Surgeon: Wylene Simmer, MD;  Location: [place];  Service: Orthopedics;  Laterality: Left;  . APPENDECTOMY  1965  . 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 CARDIAC CATHETERIZATION  12/2010  . CARDIOVERSION  07/2011  . CARDIOVERSION N/A 04/18/2014   Procedure: CARDIOVERSION;  Surgeon: Dorothy Spark, MD;  Location: [facility] ENDOSCOPY;  Service: Cardiovascular;  Laterality: N/A;  . CARDIOVERSION N/A 11/03/2015   Procedure: CARDIOVERSION;  Surgeon: Lelon Perla, MD;  Location: [facility] ENDOSCOPY;  Service: Cardiovascular;  Laterality: N/A;  . CARDIOVERSION N/A 05/08/2017   Procedure: CARDIOVERSION;  Surgeon: Dorothy Spark, MD;  Location: [facility] ENDOSCOPY;  Service: Cardiovascular;  Laterality: N/A;  . CARDIOVERSION N/A 07/28/2017   Procedure: CARDIOVERSION;  Surgeon: Dorothy Spark, MD;  Location: [place];  Service: Cardiovascular;  Laterality: N/A;  . LOWER EXTREMITY ANGIOGRAM N/A 06/08/2014   Procedure: LOWER EXTREMITY ANGIOGRAM;  Surgeon: Wellington Hampshire, MD;  Location: [facility] CATH LAB;  Service: Cardiovascular;  Laterality: N/A;  . LOWER EXTREMITY ANGIOGRAPHY Left 04/09/2018    Procedure: Lower Extremity Angiography;  Surgeon: Conrad Sabine, MD;  Location: [place] CV LAB;  Service: Cardiovascular;  Laterality: Left;  . PACEMAKER IMPLANT N/A 12/04/2017   Procedure: PACEMAKER IMPLANT;  Surgeon: Constance Haw, MD;  Location: [place] CV LAB;  Service: Cardiovascular;  Laterality: N/A;  . PERIPHERAL VASCULAR BALLOON ANGIOPLASTY Left 04/09/2018   Procedure: PERIPHERAL VASCULAR BALLOON ANGIOPLASTY;  Surgeon: Conrad [place], MD;  Location: [place] CV LAB;  Service: Cardiovascular;  Laterality: Left;  SFA  . POPLITEAL ARTERY ANGIOPLASTY Right 06/08/2014   Archie Endo 06/08/2014  . RIGHT/LEFT HEART CATH AND CORONARY ANGIOGRAPHY N/A 10/08/2017   Procedure: RIGHT/LEFT HEART CATH AND CORONARY ANGIOGRAPHY;  Surgeon: Burnell Blanks, MD;  Location: [place] CV LAB;  Service: Cardiovascular;  Laterality: N/A;  . SKIN CANCER EXCISION Bilateral    "have had them cut off back of neck X 2; off left upper arm; right wrist, near right shoulder blade" (06/08/2014)  . TEE WITHOUT CARDIOVERSION N/A 12/02/2017   Procedure: TRANSESOPHAGEAL ECHOCARDIOGRAM (TEE);  Surgeon: Burnell Blanks, MD;  Location: [place];  Service: Open Heart Surgery;  Laterality: N/A;  . TEMPORARY PACEMAKER N/A 12/04/2017   Procedure: TEMPORARY PACEMAKER;  Surgeon: Leonie Man, MD;  Location: [place] CV LAB;  Service: Cardiovascular;  Laterality: N/A;  . TRANSCATHETER AORTIC VALVE REPLACEMENT, TRANSFEMORAL N/A 12/02/2017   Procedure: TRANSCATHETER AORTIC VALVE REPLACEMENT, TRANSFEMORAL;  Surgeon: Burnell Blanks, MD;  Location: [place];  Service: Open Heart Surgery;  Laterality: N/A;  using Edwards Sapien 3 Transcatheter Heart Valve size 7mm    There were no vitals filed for this visit.  Subjective Assessment - 01/12/19 1406    Subjective  Planning to see Dr. Doran Durand on Thursday at 10:00 am to have wound looked at on limb. New wound on inside of residual limb (lateral knee area)     Pertinent History  L TTA, CAD, PAF,  pacemaker, DM2, neuropathy, CKD, HTN,     Limitations  Lifting;Standing;Walking;House hold activities    Currently in Pain?  No/denies    Pain Score  0-No pain            [facility] Adult PT Treatment/Exercise - 01/12/19 1411      Transfers   Transfers  Sit to Stand;Stand to Sit    Sit to Stand  6: Modified independent (Device/Increase time);With upper extremity assist;With armrests;From chair/3-in-1    Stand to Sit  6: Modified independent (Device/Increase time);With upper extremity assist;With armrests;To chair/3-in-1      Ambulation/Gait   Ambulation/Gait  Yes    Ambulation/Gait Assistance  4: Min guard;4: Min assist;5: Supervision    Ambulation/Gait Assistance Details  supervision with rollator into/out of gym; use of cane in therapy with min guard to min assist at times for balance, cues on posture, incr step length and incr step height with gait.     Ambulation  Distance (Feet)  50 Feet   x2   Assistive device  Rollator;Prosthesis;Straight cane   cane with rubber quad tip   Gait Pattern  Step-through pattern;Decreased stride length;Decreased trunk rotation;Trunk flexed    Ambulation Surface  Level;Indoor      Prosthetics   Current prosthetic wear tolerance (days/week)   daily     Current prosthetic wear tolerance (#hours/day)   6 hrs 2x/day    Residual limb condition   wound on lateral tibial plateau measuring today 7 cm in width, 5 cm in length. recovered with Tegaderm. new small open blister area on medial knee area of residual limb, also covered with intact Tegaderm. Pt advised to leave wounds open to air as much as possible to allow them to dry out and heal.                              Education Provided  Residual limb care;Proper wear schedule/adjustment;Proper weight-bearing schedule/adjustment;Correct ply sock adjustment    Person(s) Educated  Patient    Education Method  Explanation;Demonstration;Verbal cues    Education Method   Verbalized understanding;Returned demonstration;Verbal cues required;Needs further instruction    Donning Prosthesis  Supervision    Doffing Prosthesis  Modified independent (device/increased time)          Balance Exercises - 01/12/19 1443      Balance Exercises: Standing   Standing Eyes Closed  Wide ([place]);Head turns;Foam/compliant surface;Limitations;30 secs;Other reps (comment)    Balance Beam  standing across blue foam beam: alternating fwd stepping to floor/back onto beam, then alternating bwd stepping to floor/back onto beam. 10 reps each with cues for incr step length/incr step height. min guard to min assist for balance with cues on posture and weight shifitng.       Balance Exercises: Standing   Standing Eyes Closed Limitations  standing across blue foam beam: no UE support- EC no head movements, progressing toward EC head movements left<>right, then up<>down. min guard to min assist for balance with cues on posture and weight shifting for balance assist/recovery.                             PT Short Term Goals - 01/06/19 1217      PT SHORT TERM GOAL #1   Title  Patient verbalizes proper sock management & skin management with no wounds on limb. (All updated STGs Target Date 01/29/2019)    Status  New    Target Date  01/29/19      PT SHORT TERM GOAL #2   Title  Patient tolerates prosthesis wear >12 hrs total/day with limb pain </= 2/10 & no wounds.     Status  New    Target Date  01/29/19      PT SHORT TERM GOAL #3   Title  Patient negotiates ramps & curbs with rollator walker & prosthesis with supervision.     Status  Revised    Target Date  01/29/19      PT SHORT TERM GOAL #4   Title  Patient ambulates 105' with cane & moderate hand hold assist.     Baseline        Status  Revised    Target Date  01/29/19        PT Long Term Goals - 12/01/18 1900      PT LONG TERM GOAL #1   Title  Patient  verbalizes & demonstrates proper prosthetic care including donning /  doffing independently to enable safe use of prosthesis (All LTGs Target Date: 02/26/2019)    Time  3    Period  Months    Status  On-going    Target Date  02/26/19      PT LONG TERM GOAL #2   Title  Patient tolerates prosthesis wear daily for >90% of awake hours without skin or limb pain issues to enable function throughout his day.     Time  3    Period  Months    Status  On-going    Target Date  02/26/19      PT LONG TERM GOAL #3   Title  Berg Balance >/= 45/56 to indicate lower fall risk & less dependency in standing ADLs.     Time  3    Period  Months    Status  Revised    Target Date  02/26/19      PT LONG TERM GOAL #4   Title  Patient ambulates 600' with RW (or rollator) & prosthesis outdoors on paved or grass up to 50' surfaces modified independent for community mobility.     Time  3    Period  Months    Status  Revised    Target Date  02/26/19      PT LONG TERM GOAL #5   Title  Patient negotiates ramps, curbs with walker & stairs 1 rail /cane modified independent for community access.     Time  3    Period  Months    Status  On-going    Target Date  02/26/19      Additional Long Term Goals   Additional Long Term Goals  Yes      PT LONG TERM GOAL #6   Title  Patient ambulates 26' around furniture with cane & prosthesis modified independent for household mobility.     Time  3    Period  Months    Status  New    Target Date  02/26/19            Plan - 01/12/19 2322    Clinical Impression Statement  Today's skilled session focused on residual limb care, gait with cane and balance reactions. No issues reported in session. The pt is progressing toward goals and should benefit from continued PT to progress toward unmet goals.     Rehab Potential  Good    PT Frequency  2x / week    PT Duration  Other (comment)   90 days (13 weeks)   PT Treatment/Interventions  ADLs/Self Care Home Management;Canalith Repostioning;DME Instruction;Gait training;Stair  training;Functional mobility training;Therapeutic activities;Therapeutic exercise;Balance training;Neuromuscular re-education;Patient/family education;Prosthetic Training;Manual techniques;Vestibular    PT Next Visit Plan  work towards updated STGs, montior residual limb, continue to work on gait with cane for short distances, rollator for longer/community distances, balance reactions    Consulted and Agree with Plan of Care  Patient       Patient will benefit from skilled therapeutic intervention in order to improve the following deficits and impairments:  Abnormal gait, Decreased activity tolerance, Decreased balance, Decreased endurance, Decreased knowledge of use of DME, Decreased mobility, Impaired flexibility, Decreased range of motion, Decreased strength, Decreased skin integrity, Dizziness, Postural dysfunction, Prosthetic Dependency  Visit Diagnosis: Muscle weakness (generalized)  Other abnormalities of gait and mobility  Abnormal posture  Unsteadiness on feet     Problem List Patient Active Problem List   Diagnosis Date  Noted  . Controlled type 2 diabetes mellitus without complication ([place]) 97/35/3299  . Bradycardia 09/01/2018  . Gangrene of left foot ([place]) 05/28/2018  . 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 Obstructive sleep apnea syndrome 05/04/2018  . Obstructive sleep apnea treated with continuous positive airway pressure (CPAP) 05/04/2018  . Type II diabetes mellitus, uncontrolled ([place]) 04/22/2018  . Atherosclerosis of native arteries of the extremities with gangrene ([place]) 04/09/2018  . Atherosclerosis of native artery of extremity ([place]) 04/09/2018  . Cellulitis in diabetic foot ([place]) 04/04/2018  . Paroxysmal atrial fibrillation ([place]) 12/05/2017  . Cardiac pacemaker in situ   . Symptomatic bradycardia   . Asystole ([place])   . 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 History of aortic valve replacement  12/02/2017  . Severe aortic stenosis   . Bilateral impacted cerumen 01/22/2016  . Essential hypertension 11/14/2015  . Peripheral vascular disease ([place]) 05/24/2014  . Chronic kidney disease 08/30/2013  . Hypothyroidism 08/30/2013  . 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 Diabetes mellitus type 2, uncomplicated ([place])   . Aortic valve stenosis 07/25/2011  . Hyperlipidemia 01/10/2011  . CAD (coronary artery disease) 01/10/2011  . Coronary arteriosclerosis 01/10/2011    Willow Ora, PTA, [facility] [address], [place], [place] [address] [phone] 01/12/19, 11:24 PM   Name: Steven Maldonado MRN: [id number] Date of Birth: 01-03-1941

## 2019-01-14 ENCOUNTER — Encounter: Payer: Self-pay | Admitting: Physical Therapy

## 2019-01-14 DIAGNOSIS — Z89512 Acquired absence of left leg below knee: Secondary | ICD-10-CM | POA: Diagnosis not present

## 2019-01-14 DIAGNOSIS — L8990 Pressure ulcer of unspecified site, unspecified stage: Secondary | ICD-10-CM | POA: Diagnosis not present

## 2019-01-14 DIAGNOSIS — Z09 Encounter for follow-up examination after completed treatment for conditions other than malignant neoplasm: Secondary | ICD-10-CM | POA: Diagnosis not present

## 2019-01-15 ENCOUNTER — Encounter: Payer: Self-pay | Admitting: Nurse Practitioner

## 2019-01-15 ENCOUNTER — Ambulatory Visit: Payer: Medicare Other | Admitting: Physical Therapy

## 2019-01-15 ENCOUNTER — Encounter: Payer: Self-pay | Admitting: Physical Therapy

## 2019-01-15 DIAGNOSIS — M6249 Contracture of muscle, multiple sites: Secondary | ICD-10-CM | POA: Diagnosis not present

## 2019-01-15 DIAGNOSIS — R2689 Other abnormalities of gait and mobility: Secondary | ICD-10-CM | POA: Diagnosis not present

## 2019-01-15 DIAGNOSIS — R293 Abnormal posture: Secondary | ICD-10-CM

## 2019-01-15 DIAGNOSIS — Z9181 History of falling: Secondary | ICD-10-CM | POA: Diagnosis not present

## 2019-01-15 DIAGNOSIS — M6281 Muscle weakness (generalized): Secondary | ICD-10-CM

## 2019-01-15 DIAGNOSIS — R2681 Unsteadiness on feet: Secondary | ICD-10-CM | POA: Diagnosis not present
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
## 2019-01-19 ENCOUNTER — Ambulatory Visit (HOSPITAL_COMMUNITY)
Admission: RE | Admit: 2019-01-19 | Discharge: 2019-01-19 | Disposition: A | Discharge status: Home / Self Care | Payer: Medicare Other | Source: Ambulatory Visit | Attending: Vascular Surgery | Admitting: Vascular Surgery

## 2019-01-19 ENCOUNTER — Ambulatory Visit (INDEPENDENT_AMBULATORY_CARE_PROVIDER_SITE_OTHER): Payer: Medicare Other | Admitting: Family

## 2019-01-19 ENCOUNTER — Ambulatory Visit: Payer: Medicare Other | Admitting: Physical Therapy

## 2019-01-19 ENCOUNTER — Encounter: Payer: Self-pay | Admitting: Physical Therapy

## 2019-01-19 ENCOUNTER — Encounter: Payer: Self-pay | Admitting: Family

## 2019-01-19 ENCOUNTER — Other Ambulatory Visit: Payer: Self-pay

## 2019-01-19 VITALS — BP 122/61 | HR 80 | Temp 97.6°F | Resp 18 | Ht 71.0 in | Wt 235.6 lb

## 2019-01-19 DIAGNOSIS — I779 Disorder of arteries and arterioles, unspecified: Secondary | ICD-10-CM

## 2019-01-19 DIAGNOSIS — M6281 Muscle weakness (generalized): Secondary | ICD-10-CM

## 2019-01-19 DIAGNOSIS — Z89512 Acquired absence of left leg below knee: Secondary | ICD-10-CM

## 2019-01-19 DIAGNOSIS — I70262 Atherosclerosis of native arteries of extremities with gangrene, left leg: Secondary | ICD-10-CM

## 2019-01-19 DIAGNOSIS — Z87891 Personal history of nicotine dependence: Secondary | ICD-10-CM | POA: Diagnosis not present

## 2019-01-19 DIAGNOSIS — I70263 Atherosclerosis of native arteries of extremities with gangrene, bilateral legs: Secondary | ICD-10-CM

## 2019-01-19 DIAGNOSIS — M6249 Contracture of muscle, multiple sites: Secondary | ICD-10-CM | POA: Diagnosis not present

## 2019-01-19 DIAGNOSIS — R2681 Unsteadiness on feet: Secondary | ICD-10-CM | POA: Diagnosis not present

## 2019-01-19 DIAGNOSIS — R2689 Other abnormalities of gait and mobility: Secondary | ICD-10-CM

## 2019-01-19 DIAGNOSIS — I739 Peripheral vascular disease, unspecified: Secondary | ICD-10-CM

## 2019-01-19 DIAGNOSIS — I6523 Occlusion and stenosis of bilateral carotid arteries: Secondary | ICD-10-CM | POA: Diagnosis not present

## 2019-01-19 DIAGNOSIS — R293 Abnormal posture: Secondary | ICD-10-CM | POA: Diagnosis not present

## 2019-01-19 DIAGNOSIS — E1151 Type 2 diabetes mellitus with diabetic peripheral angiopathy without gangrene: Secondary | ICD-10-CM | POA: Diagnosis not present

## 2019-01-19 DIAGNOSIS — Z48812 Encounter for surgical aftercare following surgery on the circulatory system: Secondary | ICD-10-CM

## 2019-01-19 DIAGNOSIS — Z9181 History of falling: Secondary | ICD-10-CM | POA: Diagnosis not present

## 2019-01-19 NOTE — Therapy (Signed)
[facility] [address] [place], [name], [address] Phone: [phone]   Fax:  [phone]  Physical Therapy Treatment  Patient Details  Name: Steven Maldonado MRN: [id number] Date of Birth: 16-Nov-1940 Referring Provider (PT): Mechele Claude, [place]   Encounter Date: 01/19/2019  PT End of Session - 01/19/19 1330    Visit Number  37    Number of Visits  50    Date for PT Re-Evaluation  02/26/19    Authorization Type  Medicare & Federal BCBS    Authorization Time Period  Medicare guidelines, Fed BCBS waves co-pay.  75 visit limit PT, OT & ST combined with 1 used.    Authorization - Visit Number  17    Authorization - Number of Visits  75    PT Start Time  1751    PT Stop Time  1315    PT Time Calculation (min)  44 min    Equipment Utilized During Treatment  Gait belt    Activity Tolerance  Patient tolerated treatment well;No increased pain;Patient limited by fatigue    Behavior During Therapy  [facility] for tasks assessed/performed       Past Medical History:  Diagnosis Date  . Chronic diastolic CHF (congestive heart failure) ([place])   . CKD (chronic kidney disease), stage III ([place])   . Constipation   . Coronary artery disease    a. Cath February 2012 in [facility], occluded RCA with collaterals  . DM type 2 (diabetes mellitus, type 2) ([place])   . Essential hypertension   . Hyperlipidemia   . Neuropathy    feet  . Pacemaker    a. symptomatic brady after TAVR s/p MDT PPM by Dr. Curt Bears 12/04/17  . Persistent atrial fibrillation   . PONV (postoperative nausea and vomiting)    after valve surgery  . PVD (peripheral vascular disease) ([place])    a. s/p R popliteal artery stenosis tx with drug-coated balloon 05/2014, followed by Dr. Fletcher Anon.  . 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 Severe aortic stenosis    a. 12/02/17: s/p TAVR  . Skin cancer   . Sleep apnea with use of continuous positive airway pressure (CPAP)    04-11-11 AHI was 32.9 and titrated to 15 cm H20, DME is [facility]  . Subclinical hypothyroidism     Past Surgical History:  Procedure Laterality Date  . ABDOMINAL ANGIOGRAM N/A 06/08/2014   Procedure: ABDOMINAL ANGIOGRAM;  Surgeon: Wellington Hampshire, MD;  Location: [facility] CATH LAB;  Service: Cardiovascular;  Laterality: N/A;  . ABDOMINAL AORTOGRAM N/A 04/09/2018   Procedure: ABDOMINAL AORTOGRAM;  Surgeon: Conrad Somerset, MD;  Location: [facility] CV LAB;  Service: Cardiovascular;  Laterality: N/A;  . AMPUTATION Left 04/17/2018   Procedure: LEFT FOOT 3RD RAY AMPUTATION;  Surgeon: Newt Minion, MD;  Location: [facility];  Service: Orthopedics;  Laterality: Left;  . AMPUTATION Left 05/28/2018   Procedure: LEFT AMPUTATION BELOW KNEE;  Surgeon: Wylene Simmer, MD;  Location: [facility];  Service: Orthopedics;  Laterality: Left;  . APPENDECTOMY  1965  . 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 CARDIAC CATHETERIZATION  12/2010  . CARDIOVERSION  07/2011  . CARDIOVERSION N/A 04/18/2014   Procedure: CARDIOVERSION;  Surgeon: Dorothy Spark, MD;  Location: [facility] ENDOSCOPY;  Service: Cardiovascular;  Laterality: N/A;  . CARDIOVERSION N/A 11/03/2015   Procedure: CARDIOVERSION;  Surgeon: Lelon Perla, MD;  Location: [facility] ENDOSCOPY;  Service: Cardiovascular;  Laterality: N/A;  . CARDIOVERSION N/A 05/08/2017   Procedure: CARDIOVERSION;  Surgeon: Dorothy Spark, MD;  Location: [facility] ENDOSCOPY;  Service: Cardiovascular;  Laterality: N/A;  . CARDIOVERSION N/A 07/28/2017   Procedure: CARDIOVERSION;  Surgeon: Dorothy Spark, MD;  Location: [facility];  Service: Cardiovascular;  Laterality: N/A;  . LOWER EXTREMITY ANGIOGRAM N/A 06/08/2014   Procedure: LOWER EXTREMITY ANGIOGRAM;  Surgeon: Wellington Hampshire, MD;  Location: [facility] CATH LAB;  Service: Cardiovascular;  Laterality: N/A;  . LOWER EXTREMITY ANGIOGRAPHY Left 04/09/2018    Procedure: Lower Extremity Angiography;  Surgeon: Conrad [place], MD;  Location: [facility] CV LAB;  Service: Cardiovascular;  Laterality: Left;  . PACEMAKER IMPLANT N/A 12/04/2017   Procedure: PACEMAKER IMPLANT;  Surgeon: Constance Haw, MD;  Location: [facility] CV LAB;  Service: Cardiovascular;  Laterality: N/A;  . PERIPHERAL VASCULAR BALLOON ANGIOPLASTY Left 04/09/2018   Procedure: PERIPHERAL VASCULAR BALLOON ANGIOPLASTY;  Surgeon: Conrad Oh[place] Owingeh, MD;  Location: [facility] CV LAB;  Service: Cardiovascular;  Laterality: Left;  SFA  . POPLITEAL ARTERY ANGIOPLASTY Right 06/08/2014   Archie Endo 06/08/2014  . RIGHT/LEFT HEART CATH AND CORONARY ANGIOGRAPHY N/A 10/08/2017   Procedure: RIGHT/LEFT HEART CATH AND CORONARY ANGIOGRAPHY;  Surgeon: Burnell Blanks, MD;  Location: [facility] CV LAB;  Service: Cardiovascular;  Laterality: N/A;  . SKIN CANCER EXCISION Bilateral    "have had them cut off back of neck X 2; off left upper arm; right wrist, near right shoulder blade" (06/08/2014)  . TEE WITHOUT CARDIOVERSION N/A 12/02/2017   Procedure: TRANSESOPHAGEAL ECHOCARDIOGRAM (TEE);  Surgeon: Burnell Blanks, MD;  Location: [facility];  Service: Open Heart Surgery;  Laterality: N/A;  . TEMPORARY PACEMAKER N/A 12/04/2017   Procedure: TEMPORARY PACEMAKER;  Surgeon: Leonie Man, MD;  Location: [facility] CV LAB;  Service: Cardiovascular;  Laterality: N/A;  . TRANSCATHETER AORTIC VALVE REPLACEMENT, TRANSFEMORAL N/A 12/02/2017   Procedure: TRANSCATHETER AORTIC VALVE REPLACEMENT, TRANSFEMORAL;  Surgeon: Burnell Blanks, MD;  Location: [facility];  Service: Open Heart Surgery;  Laterality: N/A;  using Edwards Sapien 3 Transcatheter Heart Valve size 48mm    There were no vitals filed for this visit.  Subjective Assessment - 01/19/19 1230    Subjective  He is wearing prosthesis daily all awake with drying limb/liner. No more falls.     Pertinent History  L TTA, CAD, PAF,  pacemaker, DM2,  neuropathy, CKD, HTN,     Limitations  Lifting;Standing;Walking;House hold activities    Patient Stated Goals  To use prosthesis in community, travel (uses Lucianne Lei), take of himself so wife does not have to do it. Housework.     Currently in Pain?  No/denies                       [facility] Adult PT Treatment/Exercise - 01/19/19 1230      Transfers   Transfers  Sit to Stand;Stand to Sit;Floor to Transfer    Sit to Stand  6: Modified independent (Device/Increase time);With upper extremity assist;With armrests;From chair/3-in-1   to rollator   Stand to Sit  6: Modified independent (Device/Increase time);With upper extremity assist;With armrests;To chair/3-in-1   from rollator     Ambulation/Gait   Ambulation/Gait  Yes    Ambulation/Gait Assistance  4: Min assist;5: Supervision   MinA cane & supervision rollator   Ambulation/Gait Assistance Details  verbal & tactile cues on cane placement, upright  posture & wt shift.   Rollator brakes need adjusting. PT instructed to have his son tighten brakes so wheels do not spin when they are on.     Ambulation Distance (Feet)  50 Feet   64' with cane,  100' rollator   Assistive device  Rollator;Prosthesis;Straight cane   cane has quad tip   Gait Pattern  Step-through pattern;Decreased stride length;Decreased trunk rotation;Trunk flexed    Ambulation Surface  Indoor;Level      Prosthetics   Prosthetic Care Comments   reviewed suspension pin alignment with donning liner, orientation of socket in relation to patella    Current prosthetic wear tolerance (days/week)   daily     Current prosthetic wear tolerance (#hours/day)   most of awake hours, drying 2 times during day.    Residual limb condition   wound on lateral tibial plateau covered with Tegaderm. area on medial knee has healed.                              Education Provided  Residual limb care    Person(s) Educated  Patient    Education Method  Explanation;Demonstration;Tactile  cues;Verbal cues    Education Method  Verbalized understanding;Needs further instruction    Donning Prosthesis  Supervision    Doffing Prosthesis  Modified independent (device/increased time)          Balance Exercises - 01/19/19 1230      Balance Exercises: Standing   Standing Eyes Opened  Wide (BOA);Foam/compliant surface;Head turns;5 reps   crossways on foam beam, light support on parallel bars   Standing Eyes Closed  Wide (BOA);Foam/compliant surface;10 secs;3 reps   light UE support on parallel bars   Rockerboard  Anterior/posterior;Lateral;Head turns;EO;5 reps;UE support   light support on bars, wt shift/stabilize/ recovery   Balance Beam  standing across blue foam beam: alternating fwd stepping to floor/back onto beam, then alternating bwd stepping to floor/back onto beam. 10 reps each with cues for incr step length/incr step height. min guard to min assist for balance with cues on posture and weight shifitng.       Balance Exercises: Standing   Standing Eyes Closed Limitations  standing across blue foam beam: no UE support- EC no head movements, progressing toward EC head movements left<>right, then up<>down. min guard to min assist for balance with cues on posture and weight shifting for balance assist/recovery.                             PT Short Term Goals - 01/06/19 1217      PT SHORT TERM GOAL #1   Title  Patient verbalizes proper sock management & skin management with no wounds on limb. (All updated STGs Target Date 01/29/2019)    Status  New    Target Date  01/29/19      PT SHORT TERM GOAL #2   Title  Patient tolerates prosthesis wear >12 hrs total/day with limb pain </= 2/10 & no wounds.     Status  New    Target Date  01/29/19      PT SHORT TERM GOAL #3   Title  Patient negotiates ramps & curbs with rollator walker & prosthesis with supervision.     Status  Revised    Target Date  01/29/19      PT SHORT TERM GOAL #4   Title  Patient ambulates 56'  with  cane & moderate hand hold assist.     Baseline        Status  Revised    Target Date  01/29/19        PT Long Term Goals - 12/01/18 1900      PT LONG TERM GOAL #1   Title  Patient verbalizes & demonstrates proper prosthetic care including donning / doffing independently to enable safe use of prosthesis (All LTGs Target Date: 02/26/2019)    Time  3    Period  Months    Status  On-going    Target Date  02/26/19      PT LONG TERM GOAL #2   Title  Patient tolerates prosthesis wear daily for >90% of awake hours without skin or limb pain issues to enable function throughout his day.     Time  3    Period  Months    Status  On-going    Target Date  02/26/19      PT LONG TERM GOAL #3   Title  Berg Balance >/= 45/56 to indicate lower fall risk & less dependency in standing ADLs.     Time  3    Period  Months    Status  Revised    Target Date  02/26/19      PT LONG TERM GOAL #4   Title  Patient ambulates 600' with RW (or rollator) & prosthesis outdoors on paved or grass up to 50' surfaces modified independent for community mobility.     Time  3    Period  Months    Status  Revised    Target Date  02/26/19      PT LONG TERM GOAL #5   Title  Patient negotiates ramps, curbs with walker & stairs 1 rail /cane modified independent for community access.     Time  3    Period  Months    Status  On-going    Target Date  02/26/19      Additional Long Term Goals   Additional Long Term Goals  Yes      PT LONG TERM GOAL #6   Title  Patient ambulates 75' around furniture with cane & prosthesis modified independent for household mobility.     Time  3    Period  Months    Status  New    Target Date  02/26/19            Plan - 01/19/19 1330    Clinical Impression Statement  Today's skilled session worked on balance reactions facilitating ankle/residual limb, hip & step strategies. His wound appears to be healing.     Rehab Potential  Good    PT Frequency  2x / week    PT  Duration  Other (comment)   90 days (13 weeks)   PT Treatment/Interventions  ADLs/Self Care Home Management;Canalith Repostioning;DME Instruction;Gait training;Stair training;Functional mobility training;Therapeutic activities;Therapeutic exercise;Balance training;Neuromuscular re-education;Patient/family education;Prosthetic Training;Manual techniques;Vestibular    PT Next Visit Plan  work towards updated STGs, montior residual limb, continue to work on gait with cane for short distances, rollator for longer/community distances, balance reactions    Consulted and Agree with Plan of Care  Patient       Patient will benefit from skilled therapeutic intervention in order to improve the following deficits and impairments:  Abnormal gait, Decreased activity tolerance, Decreased balance, Decreased endurance, Decreased knowledge of use of DME, Decreased mobility, Impaired flexibility, Decreased range of motion, Decreased strength,  Decreased skin integrity, Dizziness, Postural dysfunction, Prosthetic Dependency  Visit Diagnosis: Muscle weakness (generalized)  Other abnormalities of gait and mobility  Abnormal posture  Unsteadiness on feet     Problem List Patient Active Problem List   Diagnosis Date Noted  . Controlled type 2 diabetes mellitus without complication ([place]) 86/48/4720  . Bradycardia 09/01/2018  . Gangrene of left foot ([place]) 05/28/2018  . 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 Obstructive sleep apnea syndrome 05/04/2018  . Obstructive sleep apnea treated with continuous positive airway pressure (CPAP) 05/04/2018  . Type II diabetes mellitus, uncontrolled ([place]) 04/22/2018  . Atherosclerosis of native arteries of the extremities with gangrene ([place]) 04/09/2018  . Atherosclerosis of native artery of extremity ([place]) 04/09/2018  . Cellulitis in diabetic foot ([place]) 04/04/2018  . Paroxysmal atrial fibrillation ([place]) 12/05/2017  . Cardiac  pacemaker in situ   . Symptomatic bradycardia   . Asystole ([place])   . 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 History of aortic valve replacement 12/02/2017  . Severe aortic stenosis   . Bilateral impacted cerumen 01/22/2016  . Essential hypertension 11/14/2015  . Peripheral vascular disease ([place]) 05/24/2014  . Chronic kidney disease 08/30/2013  . Hypothyroidism 08/30/2013  . 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 Diabetes mellitus type 2, uncomplicated ([place])   . Aortic valve stenosis 07/25/2011  . Hyperlipidemia 01/10/2011  . CAD (coronary artery disease) 01/10/2011  . Coronary arteriosclerosis 01/10/2011    Jamey Reas  PT, DPT 01/19/2019, 1:38 PM  [facility] [address] [place], [name], [address] Phone: [phone]   Fax:  [phone]  Name: Steven Maldonado MRN: [id number] Date of Birth: 12/09/1940

## 2019-01-19 NOTE — Progress Notes (Signed)
[facility]    CC: Follow up peripheral artery occlusive disease  History of Present Illness Steven Maldonado is a 78 y.o. 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   The patient underwent attempt at endovascular recannulation of L pop artery. Dr. Bridgett Larsson stented the L SFA to try to improve collateral flow to the left foot on 04/09/18.  The patient underwent L 3rd ray amp with Dr. Sharol Given on 04/17/18.  This did not heal and ischemia progressed.  Dr. Sharol Given was recommending L BKA.  Pt has not returned for VVS evaluation since then until today. Pt was last evaluated by Dr. Bridgett Larsson on 05-27-18. At that time pt was scheduled for L BKA the next day. Pt has a popliteal CTO, so L BKA stump would be persisting on lateral geniculate collateral, so the perfusion for the BKA may be tenuous. Based on the patient's vascular studies and examination, Dr. Bridgett Larsson offered the patient: R ABI in 3 months.  Pt had an MI in 2012, just before he retired. He also has a pacemaker. His cardiologist is Dr. Julianne Handler, electrophysiologist is Dr. Curt Bears.   Pt denies any known hx of stroke or TIA.   He reports that he had several episodes of atrial fib in the past, states he no longer has this.   After walking a moderate distance, he states his right leg feels tired, denies pain with walking; denies non healing wounds in his right foot or leg, denies problems with his left BKA stump.   He uses C-PAP for his OSA.   He walks a half mile daily, wearing his left BKA prosthesis and using a walker with a seat.   Diabetic: Yes, 5.9 A1C on 11-23-18, improved from 11.6 about March of 2019 Tobacco use: former smoker, quit in 1974, smoked 2 ppd x 14 years. He states he gained 40 pounds after he quit smoking.   Pt meds include: Statin :Yes Betablocker: Yes ASA: No Other anticoagulants/antiplatelets: Pradaxa, hx of atrial fib  Past Medical  History:  Diagnosis Date  . Chronic diastolic CHF (congestive heart failure) ([place])   . CKD (chronic kidney disease), stage III ([place])   . Constipation   . Coronary artery disease    a. Cath February 2012 in [facility], occluded RCA with collaterals  . DM type 2 (diabetes mellitus, type 2) ([place])   . Essential hypertension   . Hyperlipidemia   . Neuropathy    feet  . Pacemaker    a. symptomatic brady after TAVR s/p MDT PPM by Dr. Curt Bears 12/04/17  . Persistent atrial fibrillation   . PONV (postoperative nausea and vomiting)    after valve surgery  . PVD (peripheral vascular disease) ([place])    a. s/p R popliteal artery stenosis tx with drug-coated balloon 05/2014, followed by Dr. Fletcher Anon.  . 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 Severe aortic stenosis    a. 12/02/17: s/p TAVR  . Skin cancer   . Sleep apnea with use of continuous positive airway pressure (CPAP)    04-11-11 AHI was 32.9 and titrated to 15 cm H20, DME is [facility]  . Subclinical hypothyroidism     Social History Social History   Tobacco Use  . Smoking status: Former Smoker    Packs/day: 2.00    Years:  14.00    Pack years: 28.00    Types: Cigarettes    Last attempt to quit: 11/11/1972    Years since quitting: 46.2  . Smokeless tobacco: Never Used  Substance Use Topics  . Alcohol use: No    Comment: quit in 1984  . Drug use: No    Family History Family History  Problem Relation Age of Onset  . Diabetes Mother   . Heart attack Mother   . Hypertension Mother   . Heart attack Father   . Heart failure Father   . Hypertension Father   . Diabetes Father   . Diabetes Sister   . Diabetes Brother   . Diabetes Other   . Diabetes Daughter        TYPE ll  . Heart Problems Daughter   . Hypertension Sister   . Hypertension Brother   . Stroke Brother     Past Surgical History:  Procedure  Laterality Date  . ABDOMINAL ANGIOGRAM N/A 06/08/2014   Procedure: ABDOMINAL ANGIOGRAM;  Surgeon: Wellington Hampshire, MD;  Location[facility] CATH LAB;  Service: Cardiovascular;  Laterality: N/A;  . ABDOMINAL AORTOGRAM N/A 04/09/2018   Procedure: ABDOMINAL AORTOGRAM;  Surgeon: Conrad [place], MD;  Location: [place] CV LAB;  Service: Cardiovascular;  Laterality: N/A;  . AMPUTATION Left 04/17/2018   Procedure: LEFT FOOT 3RD RAY AMPUTATION;  Surgeon: Newt Minion, MD;  Location: [place];  Service: Orthopedics;  Laterality: Left;  . AMPUTATION Left 05/28/2018   Procedure: LEFT AMPUTATION BELOW KNEE;  Surgeon: Wylene Simmer, MD;  Location: [place];  Service: Orthopedics;  Laterality: Left;  . APPENDECTOMY  1965  . 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 CARDIAC CATHETERIZATION  12/2010  . CARDIOVERSION  07/2011  . CARDIOVERSION N/A 04/18/2014   Procedure: CARDIOVERSION;  Surgeon: Dorothy Spark, MD;  Location: [place];  Service: Cardiovascular;  Laterality: N/A;  . CARDIOVERSION N/A 11/03/2015   Procedure: CARDIOVERSION;  Surgeon: Lelon Perla, MD;  Location: [facility] ENDOSCOPY;  Service: Cardiovascular;  Laterality: N/A;  . CARDIOVERSION N/A 05/08/2017   Procedure: CARDIOVERSION;  Surgeon: Dorothy Spark, MD;  Location: [facility] ENDOSCOPY;  Service: Cardiovascular;  Laterality: N/A;  . CARDIOVERSION N/A 07/28/2017   Procedure: CARDIOVERSION;  Surgeon: Dorothy Spark, MD;  Location: [place];  Service: Cardiovascular;  Laterality: N/A;  . LOWER EXTREMITY ANGIOGRAM N/A 06/08/2014   Procedure: LOWER EXTREMITY ANGIOGRAM;  Surgeon: Wellington Hampshire, MD;  Location: [facility] CATH LAB;  Service: Cardiovascular;  Laterality: N/A;  . LOWER EXTREMITY ANGIOGRAPHY Left 04/09/2018   Procedure: Lower Extremity Angiography;  Surgeon: Conrad [place], MD;  Location: [place] CV LAB;  Service: Cardiovascular;  Laterality: Left;  . PACEMAKER IMPLANT N/A 12/04/2017   Procedure: PACEMAKER IMPLANT;  Surgeon: Constance Haw, MD;  Location: [place] CV LAB;  Service: Cardiovascular;  Laterality: N/A;  . PERIPHERAL VASCULAR BALLOON ANGIOPLASTY Left 04/09/2018   Procedure: PERIPHERAL VASCULAR BALLOON ANGIOPLASTY;  Surgeon: Conrad [place], MD;  Location: [place] CV LAB;  Service: Cardiovascular;  Laterality: Left;  SFA  . POPLITEAL ARTERY ANGIOPLASTY Right 06/08/2014   Archie Endo 06/08/2014  . RIGHT/LEFT HEART CATH AND CORONARY ANGIOGRAPHY N/A 10/08/2017   Procedure: RIGHT/LEFT HEART CATH AND CORONARY ANGIOGRAPHY;  Surgeon: Burnell Blanks, MD;  Location: [place] CV LAB;  Service: Cardiovascular;  Laterality: N/A;  . SKIN CANCER EXCISION Bilateral    "have had them cut off back of neck X 2; off left upper arm; right  wrist, near right shoulder blade" (06/08/2014)  . TEE WITHOUT CARDIOVERSION N/A 12/02/2017   Procedure: TRANSESOPHAGEAL ECHOCARDIOGRAM (TEE);  Surgeon: Burnell Blanks, MD;  Location: [place];  Service: Open Heart Surgery;  Laterality: N/A;  . TEMPORARY PACEMAKER N/A 12/04/2017   Procedure: TEMPORARY PACEMAKER;  Surgeon: Leonie Man, MD;  Location: [place] CV LAB;  Service: Cardiovascular;  Laterality: N/A;  . TRANSCATHETER AORTIC VALVE REPLACEMENT, TRANSFEMORAL N/A 12/02/2017   Procedure: TRANSCATHETER AORTIC VALVE REPLACEMENT, TRANSFEMORAL;  Surgeon: Burnell Blanks, MD;  Location: [place];  Service: Open Heart Surgery;  Laterality: N/A;  using Edwards Sapien 3 Transcatheter Heart Valve size 82mm    No Known Allergies  Current Outpatient Medications  Medication Sig Dispense Refill  . amiodarone (PACERONE) 200 MG tablet TAKE 1 TABLET BY MOUTH EVERY DAY 90 tablet 3  . atorvastatin (LIPITOR) 80 MG tablet Take 1 tablet (80 mg total) by mouth at bedtime. 90 tablet 3  . Blood Glucose Monitoring Suppl (FREESTYLE LITE) DEVI 1 each by Does not apply route 2 (two) times daily. 1 each 0  . Choline Fenofibrate (FENOFIBRIC ACID) 135 MG CPDR TAKE 1 CAPSULE DAILY 90 capsule 3  .  dabigatran (PRADAXA) 150 MG CAPS capsule Take 150 mg by mouth every 12 (twelve) hours.     . docusate sodium (COLACE) 100 MG capsule Take 100 mg by mouth 2 (two) times daily.    . furosemide (LASIX) 20 MG tablet Take 1 tablet (20 mg total) by mouth daily. (Patient taking differently: Take 20 mg by mouth every Monday, Wednesday, and Friday. ) 90 tablet 3  . gabapentin (NEURONTIN) 300 MG capsule Take 1 capsule (300 mg total) by mouth 3 (three) times daily. 90 capsule 5  . glipiZIDE (GLUCOTROL XL) 10 MG 24 hr tablet TAKE 1 TABLET (10 MG TOTAL) BY MOUTH DAILY WITH BREAKFAST. 90 tablet 1  . glucose blood (FREESTYLE LITE) test strip 1 each by Other route 4 (four) times daily -  before meals and at bedtime. 450 each 3  . Insulin Degludec (TRESIBA FLEXTOUCH) 200 UNIT/ML SOPN Inject 66 Units into the skin at bedtime. (Patient taking differently: Inject 46 Units into the skin at bedtime. ) 5 pen 2  . insulin lispro (HUMALOG KWIKPEN) 100 UNIT/ML KwikPen Inject 5 units in the skin three times daily with meals (Patient taking differently: Inject 5 Units into the skin 2 (two) times daily. Inject 5 units in the skin twice a day with breakfast and lunch.) 15 pen 2  . Insulin Pen Needle (BD PEN NEEDLE NANO U/F) 32G X 4 MM MISC USE 4 (FOUR) TIMES DAILY. 1000 each 0  . levothyroxine (SYNTHROID, LEVOTHROID) 175 MCG tablet TAKE 1 TABLET BY MOUTH EVERY DAY 90 tablet 1  . metoprolol tartrate (LOPRESSOR) 50 MG tablet TAKE ONE AND ONE-HALF TABLET BY MOUTH TWICE A DAY 270 tablet 2  . mirabegron ER (MYRBETRIQ) 25 MG TB24 tablet Take 1 tablet (25 mg total) by mouth daily. 90 tablet 3  . pentoxifylline (TRENTAL) 400 MG CR tablet Take 1 tablet (400 mg total) by mouth 3 (three) times daily with meals. 90 tablet 3   No current facility-administered medications for this visit.     ROS: See HPI for pertinent positives and negatives.   Physical Examination  Vitals:   01/19/19 1537  BP: 122/61  Pulse: 80  Resp: 18  Temp:  97.6 F (36.4 C)  SpO2: 95%  Weight: 235 lb 9 oz (106.8 kg)  Height: 5\' 11"  (1.803  m)   Body mass index is 32.85 kg/m.  General: A&O x 3, WDWN, obese male. Gait: slow, steady, using rolling walker with seat HENT: No gross abnormalities. Large neck Eyes: PERRLA. Pulmonary: Respirations are non labored, CTAB, good air movement in all fields Cardiac: regular rhythm, no detected murmur. Pacemaker palpated left upper chest.      Carotid Bruits Right Left   Negative Negative   Radial pulses are not palpable bilaterally. Bilateral brachial pulses are 2-3+ palpable Adominal aortic pulse is not palpable                         VASCULAR EXAM: Extremities without ischemic changes, without Gangrene; without open wounds. Left BKA well healed with prosthesis in place.                                                                                                           LE Pulses Right Left       FEMORAL  2+ palpable  2+ palpable        POPLITEAL  not palpable   not palpable       POSTERIOR TIBIAL  not palpable   BKA         DORSALIS PEDIS      ANTERIOR TIBIAL not palpable  BKA    Abdomen: soft, NT, no palpable masses. Skin: no rashes, no cellulitis, no ulcers noted. Musculoskeletal: no muscle wasting or atrophy.  Neurologic: A&O X 3; appropriate affect, Sensation is normal; MOTOR FUNCTION:  moving all extremities equally, motor strength 5/5 throughout. Speech is fluent/normal. CN 2-12 intact. Psychiatric: Thought content is normal, mood appropriate for clinical situation.    ASSESSMENT: Steven Maldonado is a 78 y.o. 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  Prior to this he underwent attempt at endovascular recannulation of L pop artery. Dr. Bridgett Larsson stented the L SFA to try to improve collateral flow to the left foot on 04/09/18.   He is in physical therapy now to get used to his left BKA prosthesis, using a rolling  walker with a seat.  He has some moderate right leg fatigue with walking, no signs of ischemia in his right LE.  Left BKA stump is well healed.   Right ABI today indicates noncompressible right lower extremity arteries.  Bilateral radial and ulnar pulses are not palpable, bilateral brachial pulses are 2-3+ palpable. Pt denies tingling or numbness in either hand.   Extracranial carotid artery stenosis, no hx of stroke of TIA: carotid duplex in June 2019 showed minimal right ICA stenosis and mild to moderate left ICA stenosis.    His atherosclerotic risk factors include Type 2 DM currently in good control (previously severely uncontrolled), 2 ppd smoker x 14 years (quit in 1974), CAD, CHF, CKD stage 3, obesity, and OSA.   He takes Pradaxa, has a hx of atrial fib. He takes a daily statin.    DATA  ABI (Date: 01/19/2019):  ABI Findings: +---------+------------------+-----+----------+--------+ Right    Rt Pressure (mmHg)IndexWaveform  Comment  +---------+------------------+-----+----------+--------+ Brachial 157                                       +---------+------------------+-----+----------+--------+ PTA      64                0.41 monophasic         +---------+------------------+-----+----------+--------+ DP       >254              1.62 monophasic         +---------+------------------+-----+----------+--------+ Great Toe32                0.20 Abnormal           +---------+------------------+-----+----------+--------+  +--------+------------------+-----+--------+--------+ Left    Lt Pressure (mmHg)IndexWaveformComment  +--------+------------------+-----+--------+--------+ [id number]                            Left BKA +--------+------------------+-----+--------+--------+  +-------+-----------+-----------+------------+------------+ ABI/TBIToday's ABIToday's TBIPrevious ABIPrevious  TBI +-------+-----------+-----------+------------+------------+ Right  [place] / 0.41  0.20       1.37        0.20         +-------+-----------+-----------+------------+------------+ Left                         0.49        0            +-------+-----------+-----------+------------+------------+   Summary: Right: Resting right ankle-brachial index indicates noncompressible right lower extremity arteries, monophasic waveforms. The right toe-brachial index is abnormal.    Carotid Duplex (04-23-18): Right Carotid: Velocities in the right ICA are consistent with a 1-39% stenosis. Left Carotid: Velocities in the left ICA are consistent with a 40-59% stenosis. Vertebrals:  Bilateral vertebral arteries demonstrate antegrade flow. Subclavians: Normal flow hemodynamics were seen in bilateral subclavian arteries.    PLAN:  Based on the patient's vascular studies and examination, pt will return to clinic in 3 months with right ABI and carotid duplex.   I discussed in depth with the patient the nature of atherosclerosis, and emphasized the importance of maximal medical management including strict control of blood pressure, blood glucose, and lipid levels, obtaining regular exercise, and continued cessation of smoking.  The patient is aware that without maximal medical management the underlying atherosclerotic disease process will progress, limiting the benefit of any interventions.  The patient was given information about PAD including signs, symptoms, treatment, what symptoms should prompt the patient to seek immediate medical care, and risk reduction measures to take.  Clemon Chambers, RN, MSN, FNP-C [facility] [employer] Phone: [phone]  Clinic MD: Bishop Dublin  01/19/19 4:05 PM

## 2019-01-21 ENCOUNTER — Encounter: Payer: Self-pay | Admitting: Physical Therapy

## 2019-01-21 ENCOUNTER — Ambulatory Visit: Payer: Medicare Other | Admitting: Physical Therapy

## 2019-01-21 DIAGNOSIS — M6249 Contracture of muscle, multiple sites: Secondary | ICD-10-CM | POA: Diagnosis not present

## 2019-01-21 DIAGNOSIS — M6281 Muscle weakness (generalized): Secondary | ICD-10-CM

## 2019-01-21 DIAGNOSIS — Z9181 History of falling: Secondary | ICD-10-CM | POA: Diagnosis not present

## 2019-01-21 DIAGNOSIS — R2689 Other abnormalities of gait and mobility: Secondary | ICD-10-CM | POA: Diagnosis not present

## 2019-01-21 DIAGNOSIS — R2681 Unsteadiness on feet: Secondary | ICD-10-CM | POA: Diagnosis not present

## 2019-01-21 DIAGNOSIS — R293 Abnormal posture: Secondary | ICD-10-CM

## 2019-01-21 NOTE — Therapy (Signed)
[facility] [address] [place], [name], [address] Phone: [phone]   Fax:  [phone]  Physical Therapy Treatment  Patient Details  Name: Steven Maldonado MRN: [id number] Date of Birth: [birth year] Referring Provider (PT): Mechele Claude, [place]   Encounter Date: 01/21/2019  PT End of Session - 01/21/19 1545    Visit Number  38    Number of Visits  50    Date for PT Re-Evaluation  02/26/19    Authorization Type  Medicare & Federal BCBS    Authorization Time Period  Medicare guidelines, Fed BCBS waves co-pay.  75 visit limit PT, OT & ST combined with 1 used.    Authorization - Visit Number  18    Authorization - Number of Visits  75    PT Start Time  1532    PT Stop Time  1623    PT Time Calculation (min)  51 min    Equipment Utilized During Treatment  Gait belt    Activity Tolerance  Patient tolerated treatment well;No increased pain;Patient limited by fatigue    Behavior During Therapy  [facility] for tasks assessed/performed       Past Medical History:  Diagnosis Date  . Chronic diastolic CHF (congestive heart failure) ([place])   . CKD (chronic kidney disease), stage III ([place])   . Constipation   . Coronary artery disease    a. Cath February 2012 in [facility], occluded RCA with collaterals  . DM type 2 (diabetes mellitus, type 2) ([place])   . Essential hypertension   . Hyperlipidemia   . Neuropathy    feet  . Pacemaker    a. symptomatic brady after TAVR s/p MDT PPM by Dr. Curt Bears 12/04/17  . Persistent atrial fibrillation   . PONV (postoperative nausea and vomiting)    after valve surgery  . PVD (peripheral vascular disease) ([place])    a. s/p R popliteal artery stenosis tx with drug-coated balloon 05/2014, followed by Dr. Fletcher Anon.  . 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 Severe aortic stenosis    a. 12/02/17: s/p TAVR  . Skin cancer   . Sleep apnea with use of continuous positive airway pressure (CPAP)    04-11-11 AHI was 32.9 and titrated to 15 cm H20, DME is [facility]  . Subclinical hypothyroidism     Past Surgical History:  Procedure Laterality Date  . ABDOMINAL ANGIOGRAM N/A 06/08/2014   Procedure: ABDOMINAL ANGIOGRAM;  Surgeon: Wellington Hampshire, MD;  Location: [facility] CATH LAB;  Service: Cardiovascular;  Laterality: N/A;  . ABDOMINAL AORTOGRAM N/A 04/09/2018   Procedure: ABDOMINAL AORTOGRAM;  Surgeon: Conrad Hardinsburg, MD;  Location: [facility] CV LAB;  Service: Cardiovascular;  Laterality: N/A;  . AMPUTATION Left 04/17/2018   Procedure: LEFT FOOT 3RD RAY AMPUTATION;  Surgeon: Newt Minion, MD;  Location: [place];  Service: Orthopedics;  Laterality: Left;  . AMPUTATION Left 05/28/2018   Procedure: LEFT AMPUTATION BELOW KNEE;  Surgeon: Wylene Simmer, MD;  Location: [facility];  Service: Orthopedics;  Laterality: Left;  . APPENDECTOMY  1965  . 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 CARDIAC CATHETERIZATION  12/2010  . CARDIOVERSION  07/2011  . CARDIOVERSION N/A 04/18/2014   Procedure: CARDIOVERSION;  Surgeon: Dorothy Spark, MD;  Location[facility] ENDOSCOPY;  Service: Cardiovascular;  Laterality: N/A;  . CARDIOVERSION N/A 11/03/2015   Procedure: CARDIOVERSION;  Surgeon: Lelon Perla, MD;  Location: [facility] ENDOSCOPY;  Service: Cardiovascular;  Laterality: N/A;  . CARDIOVERSION N/A 05/08/2017   Procedure: CARDIOVERSION;  Surgeon: Dorothy Spark, MD;  Location: [facility] ENDOSCOPY;  Service: Cardiovascular;  Laterality: N/A;  . CARDIOVERSION N/A 07/28/2017   Procedure: CARDIOVERSION;  Surgeon: Dorothy Spark, MD;  Location: [facility];  Service: Cardiovascular;  Laterality: N/A;  . LOWER EXTREMITY ANGIOGRAM N/A 06/08/2014   Procedure: LOWER EXTREMITY ANGIOGRAM;  Surgeon: Wellington Hampshire, MD;  Location: [facility] CATH LAB;  Service: Cardiovascular;  Laterality: N/A;  . LOWER EXTREMITY ANGIOGRAPHY Left 04/09/2018    Procedure: Lower Extremity Angiography;  Surgeon: Conrad Ellsworth, MD;  Location: [place] [facility] CV LAB;  Service: Cardiovascular;  Laterality: Left;  . PACEMAKER IMPLANT N/A 12/04/2017   Procedure: PACEMAKER IMPLANT;  Surgeon: Constance Haw, MD;  Location: [place] CV LAB;  Service: Cardiovascular;  Laterality: N/A;  . PERIPHERAL VASCULAR BALLOON ANGIOPLASTY Left 04/09/2018   Procedure: PERIPHERAL VASCULAR BALLOON ANGIOPLASTY;  Surgeon: Conrad , MD;  Location: [place] CV LAB;  Service: Cardiovascular;  Laterality: Left;  SFA  . POPLITEAL ARTERY ANGIOPLASTY Right 06/08/2014   Archie Endo 06/08/2014  . RIGHT/LEFT HEART CATH AND CORONARY ANGIOGRAPHY N/A 10/08/2017   Procedure: RIGHT/LEFT HEART CATH AND CORONARY ANGIOGRAPHY;  Surgeon: Burnell Blanks, MD;  Location: [place] CV LAB;  Service: Cardiovascular;  Laterality: N/A;  . SKIN CANCER EXCISION Bilateral    "have had them cut off back of neck X 2; off left upper arm; right wrist, near right shoulder blade" (06/08/2014)  . TEE WITHOUT CARDIOVERSION N/A 12/02/2017   Procedure: TRANSESOPHAGEAL ECHOCARDIOGRAM (TEE);  Surgeon: Burnell Blanks, MD;  Location: [place] [place];  Service: Open Heart Surgery;  Laterality: N/A;  . TEMPORARY PACEMAKER N/A 12/04/2017   Procedure: TEMPORARY PACEMAKER;  Surgeon: Leonie Man, MD;  Location: [place] CV LAB;  Service: Cardiovascular;  Laterality: N/A;  . TRANSCATHETER AORTIC VALVE REPLACEMENT, TRANSFEMORAL N/A 12/02/2017   Procedure: TRANSCATHETER AORTIC VALVE REPLACEMENT, TRANSFEMORAL;  Surgeon: Burnell Blanks, MD;  Location: [facility];  Service: Open Heart Surgery;  Laterality: N/A;  using Edwards Sapien 3 Transcatheter Heart Valve size 5mm    There were no vitals filed for this visit.  Subjective Assessment - 01/21/19 1537    Subjective  Saw [name] who make the bubble a little bigger and placed a wedge over it with a circle in the center. [name] also suggested he keep the prosthesis  off for the weekend until he comes back on Tuesday with [place] to allow the wound to heal.     Pertinent History  L TTA, CAD, PAF,  pacemaker, DM2, neuropathy, CKD, HTN,     Limitations  Lifting;Standing;Walking;House hold activities    Patient Stated Goals  To use prosthesis in community, travel (uses [employer]i), take of himself so wife does not have to do it. Housework.     Currently in Pain?  No/denies    Pain Score  0-No pain                       [facility] Adult PT Treatment/Exercise - 01/21/19 1546      Ambulation/Gait   Ambulation/Gait  Yes    Ambulation/Gait Assistance  5: Supervision    Ambulation/Gait Assistance Details  supervision with rollator    Ambulation Distance (Feet)  200 Feet   seated rest break after 100'   Assistive device  Rollator;Prosthesis    Gait Pattern  Step-through  pattern;Decreased stride length;Decreased trunk rotation;Trunk flexed    Ambulation Surface  Level;Unlevel;Outdoor;Paved    Ramp  5: Supervision   Min guard   Ramp Details (indicate cue type and reason)  verbal cues for safe rollator management and sequencing     Curb  5: Supervision   Min guard   Curb Details (indicate cue type and reason)  indoors and outdoors; verbal cues for safe rollator management and sequencing      Knee/Hip Exercises: Aerobic   Stepper  SciFit, 10 minutes random resistance level 2      Prosthetics   Prosthetic Care Comments   pt arrived after seeing [name] with [facility]. Bubble on socket has been enlarged. He also had a blue foam cushion on his limb under the liner/socket with an open circle in the center. the foam has created a skin bubble with indention around it from pressure inside the socket. the bubble area is just below the actual wound area. advised pt not to wear the foam piece as it is creating other skin issues. He verbalized understanding. Pt is to keep Tegaderm only over wound when wearing liner/prosthesis, leave open to air with shrinker on. Pt is also  planning to take weekend and Monday off of wearing the prosthesis/socket to allow wound time to heal. Advised him to use the prosthesis as needed such as with leaving home. He verbalized understanding of this as well.     Current prosthetic wear tolerance (days/week)   daily     Current prosthetic wear tolerance (#hours/day)   most of awake hours, drying 2 times during day.    Education Provided  Residual limb care;Proper wear schedule/adjustment;Proper weight-bearing schedule/adjustment;Other (comment)   see prosthetic comments above   Person(s) Educated  Patient    Education Method  Explanation;Demonstration;Verbal cues    Education Method  Verbalized understanding;Returned demonstration;Verbal cues required;Needs further instruction    Donning Prosthesis  Supervision    Doffing Prosthesis  Modified independent (device/increased time)               PT Short Term Goals - 01/06/19 1217      PT SHORT TERM GOAL #1   Title  Patient verbalizes proper sock management & skin management with no wounds on limb. (All updated STGs Target Date 01/29/2019)    Status  New    Target Date  01/29/19      PT SHORT TERM GOAL #2   Title  Patient tolerates prosthesis wear >12 hrs total/day with limb pain </= 2/10 & no wounds.     Status  New    Target Date  01/29/19      PT SHORT TERM GOAL #3   Title  Patient negotiates ramps & curbs with rollator walker & prosthesis with supervision.     Status  Revised    Target Date  01/29/19      PT SHORT TERM GOAL #4   Title  Patient ambulates 39' with cane & moderate hand hold assist.     Baseline        Status  Revised    Target Date  01/29/19        PT Long Term Goals - 12/01/18 1900      PT LONG TERM GOAL #1   Title  Patient verbalizes & demonstrates proper prosthetic care including donning / doffing independently to enable safe use of prosthesis (All LTGs Target Date: 02/26/2019)    Time  3    Period  Months  Status  On-going    Target  Date  02/26/19      PT LONG TERM GOAL #2   Title  Patient tolerates prosthesis wear daily for >90% of awake hours without skin or limb pain issues to enable function throughout his day.     Time  3    Period  Months    Status  On-going    Target Date  02/26/19      PT LONG TERM GOAL #3   Title  Berg Balance >/= 45/56 to indicate lower fall risk & less dependency in standing ADLs.     Time  3    Period  Months    Status  Revised    Target Date  02/26/19      PT LONG TERM GOAL #4   Title  Patient ambulates 600' with RW (or rollator) & prosthesis outdoors on paved or grass up to 50' surfaces modified independent for community mobility.     Time  3    Period  Months    Status  Revised    Target Date  02/26/19      PT LONG TERM GOAL #5   Title  Patient negotiates ramps, curbs with walker & stairs 1 rail /cane modified independent for community access.     Time  3    Period  Months    Status  On-going    Target Date  02/26/19      Additional Long Term Goals   Additional Long Term Goals  Yes      PT LONG TERM GOAL #6   Title  Patient ambulates 22' around furniture with cane & prosthesis modified independent for household mobility.     Time  3    Period  Months    Status  New    Target Date  02/26/19            Plan - 01/21/19 1545    Clinical Impression Statement  Treatment session focused on ramp/curb navigation, outdoor ambulator over paved/uneven surfaces, and aerobic endurance on SciFit. Pt able to verbalize and carry over previous training for ramp but continues to require verbal cues for safe negotiation of curbs (pt attempting to pick up full walker to lower off of curb and to lift walker up onto curb).  His wound appears to be healing but skin has a bubble on it after wearing donut from prosthetist.  Following education pt verbalized understanding of recommendations for wound care this weekend.      Rehab Potential  Good    PT Frequency  2x / week    PT Duration   Other (comment)   90 days (13 weeks)   PT Treatment/Interventions  ADLs/Self Care Home Management;Canalith Repostioning;DME Instruction;Gait training;Stair training;Functional mobility training;Therapeutic activities;Therapeutic exercise;Balance training;Neuromuscular re-education;Patient/family education;Prosthetic Training;Manual techniques;Vestibular    PT Next Visit Plan  STG due by 3/20.  montior residual limb wound, continue to work on gait with cane for short distances, rollator for longer/community distances - ramp and curb, balance reactions.  Start to discuss options for community fitness after D/C    Consulted and Agree with Plan of Care  Patient       Patient will benefit from skilled therapeutic intervention in order to improve the following deficits and impairments:  Abnormal gait, Decreased activity tolerance, Decreased balance, Decreased endurance, Decreased knowledge of use of DME, Decreased mobility, Impaired flexibility, Decreased range of motion, Decreased strength, Decreased skin integrity, Dizziness, Postural dysfunction, Prosthetic Dependency  Visit Diagnosis: Other abnormalities of gait and mobility  Muscle weakness (generalized)  Abnormal posture  Unsteadiness on feet  Contracture of muscle, multiple sites  History of falling     Problem List Patient Active Problem List   Diagnosis Date Noted  . Controlled type 2 diabetes mellitus without complication ([place]) 02/77/4128  . Bradycardia 09/01/2018  . Gangrene of left foot ([place]) 05/28/2018  . 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 Obstructive sleep apnea syndrome 05/04/2018  . Obstructive sleep apnea treated with continuous positive airway pressure (CPAP) 05/04/2018  . Type II diabetes mellitus, uncontrolled ([place]) 04/22/2018  . Atherosclerosis of native arteries of the extremities with gangrene ([place]) 04/09/2018  . Atherosclerosis of native artery of extremity ([place])  04/09/2018  . Cellulitis in diabetic foot ([place]) 04/04/2018  . Paroxysmal atrial fibrillation ([place]) 12/05/2017  . Cardiac pacemaker in situ   . Symptomatic bradycardia   . Asystole ([place])   . 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 History of aortic valve replacement 12/02/2017  . Severe aortic stenosis   . Bilateral impacted cerumen 01/22/2016  . Essential hypertension 11/14/2015  . Peripheral vascular disease ([place]) 05/24/2014  . Chronic kidney disease 08/30/2013  . Hypothyroidism 08/30/2013  . 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 Diabetes mellitus type 2, uncomplicated ([place])   . Aortic valve stenosis 07/25/2011  . Hyperlipidemia 01/10/2011  . CAD (coronary artery disease) 01/10/2011  . Coronary arteriosclerosis 01/10/2011   Joaquin Courts, SPT 01/21/2019, 4:29 PM  [facility] [address] [place], [name], [address] Phone: [phone]   Fax:  [phone]  Name: Steven Maldonado MRN: [id number] Date of Birth: Nov 05, 1941

## 2019-01-22 ENCOUNTER — Encounter: Payer: Medicare Other | Admitting: Physical Therapy

## 2019-01-25 ENCOUNTER — Other Ambulatory Visit: Payer: Self-pay

## 2019-01-25 ENCOUNTER — Ambulatory Visit (INDEPENDENT_AMBULATORY_CARE_PROVIDER_SITE_OTHER): Payer: Medicare Other | Admitting: Endocrinology

## 2019-01-25 ENCOUNTER — Encounter: Payer: Self-pay | Admitting: Endocrinology

## 2019-01-25 VITALS — BP 116/60 | HR 91 | Temp 98.1°F | Resp 12 | Ht 71.0 in | Wt 235.0 lb

## 2019-01-25 DIAGNOSIS — I779 Disorder of arteries and arterioles, unspecified: Secondary | ICD-10-CM | POA: Diagnosis not present

## 2019-01-25 DIAGNOSIS — Z794 Long term (current) use of insulin: Secondary | ICD-10-CM

## 2019-01-25 DIAGNOSIS — E1165 Type 2 diabetes mellitus with hyperglycemia: Secondary | ICD-10-CM

## 2019-01-25 DIAGNOSIS — E039 Hypothyroidism, unspecified: Secondary | ICD-10-CM

## 2019-01-25 LAB — COMPREHENSIVE METABOLIC PANEL
ALT: 31 U/L (ref 0–53)
AST: 25 U/L (ref 0–37)
Albumin: 4.1 g/dL (ref 3.5–5.2)
Alkaline Phosphatase: 28 U/L — ABNORMAL LOW (ref 39–117)
BUN: 26 mg/dL — ABNORMAL HIGH (ref 6–23)
CO2: 24 mEq/L (ref 19–32)
Calcium: 9.8 mg/dL (ref 8.4–10.5)
Chloride: 104 mEq/L (ref 96–112)
Creatinine, Ser: 1.42 mg/dL (ref 0.40–1.50)
GFR: 48.56 mL/min — ABNORMAL LOW (ref >60.00)
Glucose, Bld: 189 mg/dL — ABNORMAL HIGH (ref 70–99)
Potassium: 4.2 mEq/L (ref 3.5–5.1)
Sodium: 138 mEq/L (ref 135–145)
Total Bilirubin: 0.4 mg/dL (ref 0.2–1.2)
Total Protein: 6.9 g/dL (ref 6.0–8.3)

## 2019-01-25 LAB — T4, FREE: Free T4: 0.98 ng/dL (ref 0.60–1.60)

## 2019-01-25 LAB — TSH: TSH: 3.77 u[IU]/mL (ref 0.35–4.50)

## 2019-01-25 NOTE — Progress Notes (Addendum)
Patient ID: Steven Maldonado, male   DOB: 22-Jul-1941, 78 y.o.   MRN: [id number]          Reason for Appointment: Follow-up for Type 2 Diabetes  Referring PCP: Dena Billet   History of Present Illness:          Date of diagnosis of type 2 diabetes mellitus:  1999      Background history:   He had been treated with metformin and glipizide for several years Metformin was stopped about 4 years ago because of renal dysfunction Presumably because of poor control he was started on insulin around the year 2009 He had been mostly on Lantus which was subsequently changed to [facility] and he thinks that Lantus worked better Also has been on Humalog 3-4 times a day for some time  Recent history:   Most recent A1c is 5.9, has been as high as 11.6 in June 2019  INSULIN regimen is:  TRESIBA 25 at bedtime, Humalog, recently none  Non-insulin hypoglycemic drugs the patient is taking are: None  Current management, blood sugar patterns and problems identified:  He has started using the freestyle libre system since about February  He was seen by the diabetes educator for review of his management and guidance on how to use the meter and sensor  He thinks this is fairly accurate now and as occasionally compared with the fingerstick  Currently has not taken any Humalog and has only rare occasions where his blood sugars are over 180 after meals  Because of tendency to low sugars in the mornings his [name] has been progressively reduced and even last month was reduced by 5 units  With this his fasting readings are mostly normal with only a relatively low reading once  See details below for his blood sugar patterns and lives on his sensor        Side effects from medications have been: None     Meal times are:  Breakfast is at 8 AM usually             Exercise:  Currently none  Glucose monitoring:  done at least 4 times times a day         Glucometer: FreeStyle libre    CONTINUOUS GLUCOSE  MONITORING RECORD INTERPRETATION    Dates of Recording: 3/3 through 01/25/2019  Sensor description: [employer]  Results statistics:   CGM use % of time  82  Average and SD  135  Time in range  82      %  % Time Above 180  13  % Time above 250   % Time Below target  5    Glycemic patterns summary: Sugars are showing only modest amount of variability which is mostly in the afternoons and some late evening Throughout the day his blood sugars are averaging between a low of 114 after 6 a.m. with a high of 171 after 10 p.m.[name] Kitchen  Hyperglycemic episodes have occurred sporadically after meals with the overall higher postprandial blood sugar late evening but also has some high readings at other times  Hypoglycemic episodes have been minimal and blood sugar was running relatively low through the night on 3/10 and low before dinnertime 3/8  Overnight periods: Blood sugars are usually declining gradually and in the low zone only once  Preprandial periods: Blood sugars are generally near normal fasting are mildly increased around average 140 before lunch and also dinner .  Currently his dinner meal is about 6-7 PM  Postprandial periods: After breakfast: Blood sugars are not usually higher after breakfast with only occasional increase average about 142 After lunch: On occasion over 180 but on an average fairly flat after meal with average about 145 After dinner: Blood sugars are tending to increase gradually with no consistent pattern but blood sugar may be relatively higher closer to bedtime with 171 average     Previous blood sugars:  PRE-MEAL Fasting Lunch Dinner  overnight Overall  Glucose range:  55-132   95-165  39, 43   Mean/median:  90   131   117   POST-MEAL PC Breakfast PC Lunch PC Dinner  Glucose range:   126-266  33-159  Mean/median:  74-171   90    Dietician visit, most recent: 9/19  Weight history:  Wt Readings from Last 3 Encounters:  01/25/19 235 lb (106.6 kg)   01/19/19 235 lb 9 oz (106.8 kg)  01/04/19 237 lb 3.2 oz (107.6 kg)    Glycemic control:   Lab Results  Component Value Date   HGBA1C 5.9 (A) 11/23/2018   HGBA1C 6.1 (A) 08/19/2018   HGBA1C 11.6 (H) 04/17/2018   Lab Results  Component Value Date   MICROALBUR 2.8 (H) 08/18/2018   LDLCALC 93 10/13/2017   CREATININE 1.93 (H) 08/18/2018   Lab Results  Component Value Date   MICRALBCREAT 2.2 08/18/2018    Lab Results  Component Value Date   FRUCTOSAMINE 268 06/26/2018    No visits with results within 1 Week(s) from this visit.  Latest known visit with results is:  Clinical Support on 12/28/2018  Component Date Value Ref Range Status  . Date Time Interrogation Session 12/28/2018 [id number]   Final  . Pulse Generator Manufacturer 12/28/2018 MERM   Final  . Pulse Gen Model 12/28/2018 [id number] Azure XT DR MRI   Final  . Pulse Gen Serial Number 12/28/2018 [id number]   Final  . Clinic Name 12/28/2018 [place]   Final  . Implantable Pulse Generator Type 12/28/2018 Implantable Pulse Generator   Final  . Implantable Pulse Generator Implan* 12/28/2018 [id number]   Final  . Implantable Lead Manufacturer 12/28/2018 MERM   Final  . Implantable Lead Model 12/28/2018 5076 CapSureFix Novus MRI SureScan   Final  . Implantable Lead Serial Number 12/28/2018 [id number]   Final  . Implantable Lead Implant Date 12/28/2018 [id number]   Final  . Implantable Lead Location Detail 1 12/28/2018 APPENDAGE   Final  . Implantable Lead Location 12/28/2018 [id number]   Final  . Implantable Lead Manufacturer 12/28/2018 MERM   Final  . Implantable Lead Model 12/28/2018 5076 CapSureFix Novus MRI SureScan   Final  . Implantable Lead Serial Number 12/28/2018 [id number]   Final  . Implantable Lead Implant Date 12/28/2018 [id number]   Final  . Implantable Lead Location Detail 1 12/28/2018 SEPTUM   Final  . Implantable Lead Location 12/28/2018 [id number]   Final  . Lead Channel Setting Sensing Sensi* 12/28/2018 4   mV Final  . Lead Channel Setting Pacing Amplit* 12/28/2018 2  V Final  . Lead Channel Setting Pacing Pulse * 12/28/2018 0.4  ms Final  . Lead Channel Setting Pacing Amplit* 12/28/2018 2.5  V Final  . Lead Channel Impedance Value 12/28/2018 475  ohm Final  . Lead Channel Impedance Value 12/28/2018 323  ohm Final  . Lead Channel Sensing Intrinsic Amp* 12/28/2018 2.375  mV Final  . Lead Channel Sensing Intrinsic Amp* 12/28/2018 2.375  mV Final  . Lead Channel Pacing Threshold Ampl* 12/28/2018  0.75  V Final  . Lead Channel Pacing Threshold Puls* 12/28/2018 0.4  ms Final  . Lead Channel Impedance Value 12/28/2018 456  ohm Final  . Lead Channel Impedance Value 12/28/2018 361  ohm Final  . Lead Channel Sensing Intrinsic Amp* 12/28/2018 7.75  mV Final  . Lead Channel Sensing Intrinsic Amp* 12/28/2018 24.875  mV Final  . Lead Channel Pacing Threshold Ampl* 12/28/2018 0.625  V Final  . Lead Channel Pacing Threshold Puls* 12/28/2018 0.4  ms Final  . Battery Status 12/28/2018 OK   Final  . Battery Remaining Longevity 12/28/2018 121  mo Final  . Battery Voltage 12/28/2018 2.99  V Final  . Brady Statistic RA Percent Paced 12/28/2018 57.41  % Final  . Brady Statistic RV Percent Paced 12/28/2018 99.83  % Final  . Brady Statistic AP VP Percent 12/28/2018 57.37  % Final  . Brady Statistic AS VP Percent 12/28/2018 42.46  % Final  . Loletha Grayer Statistic AP VS Percent 12/28/2018 0.02  % Final  . Brady Statistic AS VS Percent 12/28/2018 0.15  % Final    Allergies as of 01/25/2019   No Known Allergies     Medication List       Accurate as of January 25, 2019 12:54 PM. Always use your most recent med list.        amiodarone 200 MG tablet Commonly known as:  PACERONE TAKE 1 TABLET BY MOUTH EVERY DAY   atorvastatin 80 MG tablet Commonly known as:  LIPITOR Take 1 tablet (80 mg total) by mouth at bedtime.   docusate sodium 100 MG capsule Commonly known as:  COLACE Take 100 mg by mouth 2 (two) times  daily.   Fenofibric Acid 135 MG Cpdr TAKE 1 CAPSULE DAILY   FreeStyle Lite Devi 1 each by Does not apply route 2 (two) times daily.   furosemide 20 MG tablet Commonly known as:  LASIX Take 1 tablet (20 mg total) by mouth daily.   gabapentin 300 MG capsule Commonly known as:  NEURONTIN Take 1 capsule (300 mg total) by mouth 3 (three) times daily.   glipiZIDE 10 MG 24 hr tablet Commonly known as:  GLUCOTROL XL TAKE 1 TABLET (10 MG TOTAL) BY MOUTH DAILY WITH BREAKFAST.   glucose blood test strip Commonly known as:  FREESTYLE LITE 1 each by Other route 4 (four) times daily -  before meals and at bedtime.   Insulin Degludec 200 UNIT/ML Sopn Commonly known as:  [place] FlexTouch Inject 66 Units into the skin at bedtime.   insulin lispro 100 UNIT/ML KwikPen Commonly known as:  HumaLOG KwikPen Inject 5 units in the skin three times daily with meals   Insulin Pen Needle 32G X 4 MM Misc Commonly known as:  BD Pen Needle Nano U/F USE 4 (FOUR) TIMES DAILY.   levothyroxine 175 MCG tablet Commonly known as:  SYNTHROID, LEVOTHROID TAKE 1 TABLET BY MOUTH EVERY DAY   metoprolol tartrate 50 MG tablet Commonly known as:  LOPRESSOR TAKE ONE AND ONE-HALF TABLET BY MOUTH TWICE A DAY   mirabegron ER 25 MG Tb24 tablet Commonly known as:  Myrbetriq Take 1 tablet (25 mg total) by mouth daily.   pentoxifylline 400 MG CR tablet Commonly known as:  TRENTAL Take 1 tablet (400 mg total) by mouth 3 (three) times daily with meals.   Pradaxa 150 MG Caps capsule Generic drug:  dabigatran Take 150 mg by mouth every 12 (twelve) hours.       Allergies: No  Known Allergies  Past Medical History:  Diagnosis Date  . Chronic diastolic CHF (congestive heart failure) ([place])   . CKD (chronic kidney disease), stage III ([place])   . Constipation   . Coronary artery disease    a. Cath February 2012 in [place] [facility], occluded RCA with collaterals  . DM type 2 (diabetes mellitus, type 2) ([place])   .  Essential hypertension   . Hyperlipidemia   . Neuropathy    feet  . Pacemaker    a. symptomatic brady after TAVR s/p MDT PPM by Dr. Curt Bears 12/04/17  . Persistent atrial fibrillation   . PONV (postoperative nausea and vomiting)    after valve surgery  . PVD (peripheral vascular disease) ([place])    a. s/p R popliteal artery stenosis tx with drug-coated balloon 05/2014, followed by Dr. Fletcher Anon.  . 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 Severe aortic stenosis    a. 12/02/17: s/p TAVR  . Skin cancer   . Sleep apnea with use of continuous positive airway pressure (CPAP)    04-11-11 AHI was 32.9 and titrated to 15 cm H20, DME is [facility]  . Subclinical hypothyroidism     Past Surgical History:  Procedure Laterality Date  . ABDOMINAL ANGIOGRAM N/A 06/08/2014   Procedure: ABDOMINAL ANGIOGRAM;  Surgeon: Wellington Hampshire, MD;  Location: [facility] CATH LAB;  Service: Cardiovascular;  Laterality: N/A;  . ABDOMINAL AORTOGRAM N/A 04/09/2018   Procedure: ABDOMINAL AORTOGRAM;  Surgeon: Conrad Cuero, MD;  Location: [place] CV LAB;  Service: Cardiovascular;  Laterality: N/A;  . AMPUTATION Left 04/17/2018   Procedure: LEFT FOOT 3RD RAY AMPUTATION;  Surgeon: Newt Minion, MD;  Location: [place];  Service: Orthopedics;  Laterality: Left;  . AMPUTATION Left 05/28/2018   Procedure: LEFT AMPUTATION BELOW KNEE;  Surgeon: Wylene Simmer, MD;  Location: [place];  Service: Orthopedics;  Laterality: Left;  . APPENDECTOMY  1965  . 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 CARDIAC CATHETERIZATION  12/2010  . CARDIOVERSION  07/2011  . CARDIOVERSION N/A 04/18/2014   Procedure: CARDIOVERSION;  Surgeon: Dorothy Spark, MD;  Location: [place];  Service: Cardiovascular;  Laterality: N/A;  . CARDIOVERSION N/A 11/03/2015   Procedure: CARDIOVERSION;  Surgeon: Lelon Perla, MD;  Location: [facility] ENDOSCOPY;   Service: Cardiovascular;  Laterality: N/A;  . CARDIOVERSION N/A 05/08/2017   Procedure: CARDIOVERSION;  Surgeon: Dorothy Spark, MD;  Location: [facility] ENDOSCOPY;  Service: Cardiovascular;  Laterality: N/A;  . CARDIOVERSION N/A 07/28/2017   Procedure: CARDIOVERSION;  Surgeon: Dorothy Spark, MD;  Location: [place];  Service: Cardiovascular;  Laterality: N/A;  . LOWER EXTREMITY ANGIOGRAM N/A 06/08/2014   Procedure: LOWER EXTREMITY ANGIOGRAM;  Surgeon: Wellington Hampshire, MD;  Location[facility] CATH LAB;  Service: Cardiovascular;  Laterality: N/A;  . LOWER EXTREMITY ANGIOGRAPHY Left 04/09/2018   Procedure: Lower Extremity Angiography;  Surgeon: Conrad Darden, MD;  Location: [place] CV LAB;  Service: Cardiovascular;  Laterality: Left;  . PACEMAKER IMPLANT N/A 12/04/2017   Procedure: PACEMAKER IMPLANT;  Surgeon: Constance Haw, MD;  Location: [place] CV LAB;  Service: Cardiovascular;  Laterality: N/A;  . PERIPHERAL VASCULAR BALLOON ANGIOPLASTY Left 04/09/2018   Procedure: PERIPHERAL VASCULAR BALLOON ANGIOPLASTY;  Surgeon: Conrad Scioto, MD;  Location: [place] CV LAB;  Service: Cardiovascular;  Laterality: Left;  SFA  . POPLITEAL ARTERY ANGIOPLASTY Right 06/08/2014   /  notes 06/08/2014  . RIGHT/LEFT HEART CATH AND CORONARY ANGIOGRAPHY N/A 10/08/2017   Procedure: RIGHT/LEFT HEART CATH AND CORONARY ANGIOGRAPHY;  Surgeon: Burnell Blanks, MD;  Location: [place] CV LAB;  Service: Cardiovascular;  Laterality: N/A;  . SKIN CANCER EXCISION Bilateral    "have had them cut off back of neck X 2; off left upper arm; right wrist, near right shoulder blade" (06/08/2014)  . TEE WITHOUT CARDIOVERSION N/A 12/02/2017   Procedure: TRANSESOPHAGEAL ECHOCARDIOGRAM (TEE);  Surgeon: Burnell Blanks, MD;  Location: [place];  Service: Open Heart Surgery;  Laterality: N/A;  . TEMPORARY PACEMAKER N/A 12/04/2017   Procedure: TEMPORARY PACEMAKER;  Surgeon: Leonie Man, MD;  Location: [place] CV  LAB;  Service: Cardiovascular;  Laterality: N/A;  . TRANSCATHETER AORTIC VALVE REPLACEMENT, TRANSFEMORAL N/A 12/02/2017   Procedure: TRANSCATHETER AORTIC VALVE REPLACEMENT, TRANSFEMORAL;  Surgeon: Burnell Blanks, MD;  Location: [place];  Service: Open Heart Surgery;  Laterality: N/A;  using Edwards Sapien 3 Transcatheter Heart Valve size 16mm    Family History  Problem Relation Age of Onset  . Diabetes Mother   . Heart attack Mother   . Hypertension Mother   . Heart attack Father   . Heart failure Father   . Hypertension Father   . Diabetes Father   . Diabetes Sister   . Diabetes Brother   . Diabetes Other   . Diabetes Daughter        TYPE ll  . Heart Problems Daughter   . Hypertension Sister   . Hypertension Brother   . Stroke Brother     Social History:  reports that he quit smoking about 46 years ago. His smoking use included cigarettes. He has a 28.00 pack-year smoking history. He has never used smokeless tobacco. He reports that he does not drink alcohol or use drugs.   Review of Systems   Lipid history: He is taking 80 mg atorvastatin with adequate control, high-dose with history of CAD    Lab Results  Component Value Date   CHOL 131 08/18/2018   HDL 30.30 (L) 08/18/2018   LDLCALC 93 10/13/2017   LDLDIRECT 77.0 08/18/2018   TRIG 219.0 (H) 08/18/2018   CHOLHDL 4 08/18/2018           Hypertension: Has been minimal with only antihypertensive being metoprolol, not on ACE inhibitor  BP Readings from Last 3 Encounters:  01/25/19 116/60  01/19/19 122/61  01/04/19 (!) 100/57    Most recent eye exam was in 12/18 ?  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 History of symptomatic neuropathy  He has chronic kidney disease of unclear etiology, to be followed by nephrologist periodically Last creatinine was 1.66 done in January through nephrologist  Lab Results  Component Value Date   CREATININE 1.93 (H) 08/18/2018   CREATININE 1.40 06/26/2018   CREATININE 1.75 (H) 05/30/2018       Physical Examination:  BP 116/60 (BP Location: Left Arm, Patient Position: Sitting, Cuff Size: Normal)   Pulse 91   Temp 98.1 F (  36.7 C)   Resp 12   Ht 5\' 11"  (1.803 m)   Wt 235 lb (106.6 kg)   SpO2 98%   BMI 32.78 kg/m          ASSESSMENT:  Diabetes type 2, insulin requiring with moderate obesity  See history of present illness for detailed discussion of current diabetes management, blood sugar patterns and problems identified  A1c is last 5.9  He has generally done well with his diet However still has some tendency to high postprandial readings around 180-200 at times based on his carbohydrate intake Fasting readings are excellent with now using 25 units [place] which has been progressively reduced over the last few months His average blood sugar is only 135 on his freestyle libre with 82% data available Blood sugars data shows 82% are within the range  CKD: To follow-up with nephrologist as indicated and reviewed previous  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: Last TSH was normal and will need to be followed up    PLAN:    Discussed need to periodically take 4 to 6 units of Humalog when he is planning to eat dessert or higher fat meals especially eating out and showed him where his blood sugars are going up  This may be at any meal during the day  However do not think that he needs to take extra insulin postprandially if the blood sugar is around 200 especially late in the evening  Also discussed that if he  continues to have hypoglycemia overnight will need to reduce his insulin  Need to follow-up on his kidney function  Encouraged him to be as active as possible  To make sure he checks his blood sugar at least 3 times a day with his freestyle libre to allow for him interpretation  No change in [place] as yet  There are no Patient Instructions on file for this visit.   Counseling time on subjects discussed in assessment and plan sections is over 50% of today's 25 minute visit   Elayne Snare 01/25/2019, 12:54 PM   Note: This office note was prepared with Dragon voice recognition system technology. Any transcriptional errors that result from this process are unintentional.

## 2019-01-26 ENCOUNTER — Encounter: Payer: Self-pay | Admitting: Physical Therapy

## 2019-01-26 ENCOUNTER — Ambulatory Visit: Payer: Medicare Other | Admitting: Physical Therapy

## 2019-01-26 DIAGNOSIS — R2689 Other abnormalities of gait and mobility: Secondary | ICD-10-CM | POA: Diagnosis not present

## 2019-01-26 DIAGNOSIS — R2681 Unsteadiness on feet: Secondary | ICD-10-CM | POA: Diagnosis not present

## 2019-01-26 DIAGNOSIS — Z9181 History of falling: Secondary | ICD-10-CM | POA: Diagnosis not present

## 2019-01-26 DIAGNOSIS — R293 Abnormal posture: Secondary | ICD-10-CM | POA: Diagnosis not present

## 2019-01-26 DIAGNOSIS — M6281 Muscle weakness (generalized): Secondary | ICD-10-CM

## 2019-01-26 DIAGNOSIS — M6249 Contracture of muscle, multiple sites: Secondary | ICD-10-CM | POA: Diagnosis not present

## 2019-01-26 NOTE — Therapy (Signed)
[facility] [address] [place], [name], [address] Phone: [phone]   Fax:  [phone]  Physical Therapy Treatment  Patient Details  Name: Steven Maldonado MRN: [id number] Date of Birth: 10-09-1941 Referring Provider (PT): Mechele Claude, [place]   Encounter Date: 01/26/2019  PT End of Session - 01/26/19 1457    Visit Number  39    Number of Visits  50    Date for PT Re-Evaluation  02/26/19    Authorization Type  Medicare & Federal BCBS    Authorization Time Period  Medicare guidelines, Fed BCBS waves co-pay.  75 visit limit PT, OT & ST combined with 1 used.    Authorization - Visit Number  19    Authorization - Number of Visits  51    PT Start Time  5638    PT Stop Time  1529   pt checked out by front office before leaving today, still in session   PT Time Calculation (min)  40 min    Equipment Utilized During Treatment  Gait belt    Activity Tolerance  Patient tolerated treatment well;No increased pain;Patient limited by fatigue    Behavior During Therapy  [facility] for tasks assessed/performed       Past Medical History:  Diagnosis Date  . Chronic diastolic CHF (congestive heart failure) ([place])   . CKD (chronic kidney disease), stage III ([place])   . Constipation   . Coronary artery disease    a. Cath February 2012 in [facility], occluded RCA with collaterals  . DM type 2 (diabetes mellitus, type 2) ([place])   . Essential hypertension   . Hyperlipidemia   . Neuropathy    feet  . Pacemaker    a. symptomatic brady after TAVR s/p MDT PPM by Dr. Curt Bears 12/04/17  . Persistent atrial fibrillation   . PONV (postoperative nausea and vomiting)    after valve surgery  . PVD (peripheral vascular disease) ([place])    a. s/p R popliteal artery stenosis tx with drug-coated balloon 05/2014, followed by Dr. Fletcher Anon.  . 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 Severe aortic stenosis    a. 12/02/17: s/p TAVR  . Skin cancer   . Sleep apnea with use of continuous positive airway pressure (CPAP)    04-11-11 AHI was 32.9 and titrated to 15 cm H20, DME is [facility]  . Subclinical hypothyroidism     Past Surgical History:  Procedure Laterality Date  . ABDOMINAL ANGIOGRAM N/A 06/08/2014   Procedure: ABDOMINAL ANGIOGRAM;  Surgeon: Wellington Hampshire, MD;  Location: [facility] CATH LAB;  Service: Cardiovascular;  Laterality: N/A;  . ABDOMINAL AORTOGRAM N/A 04/09/2018   Procedure: ABDOMINAL AORTOGRAM;  Surgeon: Conrad [place], MD;  Location: [facility] CV LAB;  Service: Cardiovascular;  Laterality: N/A;  . AMPUTATION Left 04/17/2018   Procedure: LEFT FOOT 3RD RAY AMPUTATION;  Surgeon: Newt Minion, MD;  Location: [facility];  Service: Orthopedics;  Laterality: Left;  . AMPUTATION Left 05/28/2018   Procedure: LEFT AMPUTATION BELOW KNEE;  Surgeon: Wylene Simmer, MD;  Location: [facility];  Service: Orthopedics;  Laterality: Left;  . APPENDECTOMY  1965  . 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 CARDIAC CATHETERIZATION  12/2010  . CARDIOVERSION  07/2011  . CARDIOVERSION N/A 04/18/2014   Procedure: CARDIOVERSION;  Surgeon: Dorothy Spark, MD;  Location: [facility];  Service:  Cardiovascular;  Laterality: N/A;  . CARDIOVERSION N/A 11/03/2015   Procedure: CARDIOVERSION;  Surgeon: Lelon Perla, MD;  Location: [facility] ENDOSCOPY;  Service: Cardiovascular;  Laterality: N/A;  . CARDIOVERSION N/A 05/08/2017   Procedure: CARDIOVERSION;  Surgeon: Dorothy Spark, MD;  Location: [facility] ENDOSCOPY;  Service: Cardiovascular;  Laterality: N/A;  . CARDIOVERSION N/A 07/28/2017   Procedure: CARDIOVERSION;  Surgeon: Dorothy Spark, MD;  Location: [facility];  Service: Cardiovascular;  Laterality: N/A;  . LOWER EXTREMITY ANGIOGRAM N/A 06/08/2014   Procedure: LOWER EXTREMITY ANGIOGRAM;  Surgeon: Wellington Hampshire, MD;  Location: [facility] CATH LAB;  Service:  Cardiovascular;  Laterality: N/A;  . LOWER EXTREMITY ANGIOGRAPHY Left 04/09/2018   Procedure: Lower Extremity Angiography;  Surgeon: Conrad [place], MD;  Location: [facility] CV LAB;  Service: Cardiovascular;  Laterality: Left;  . PACEMAKER IMPLANT N/A 12/04/2017   Procedure: PACEMAKER IMPLANT;  Surgeon: Constance Haw, MD;  Location: [facility] CV LAB;  Service: Cardiovascular;  Laterality: N/A;  . PERIPHERAL VASCULAR BALLOON ANGIOPLASTY Left 04/09/2018   Procedure: PERIPHERAL VASCULAR BALLOON ANGIOPLASTY;  Surgeon: Conrad McNeil, MD;  Location: [facility] CV LAB;  Service: Cardiovascular;  Laterality: Left;  SFA  . POPLITEAL ARTERY ANGIOPLASTY Right 06/08/2014   Archie Endo 06/08/2014  . RIGHT/LEFT HEART CATH AND CORONARY ANGIOGRAPHY N/A 10/08/2017   Procedure: RIGHT/LEFT HEART CATH AND CORONARY ANGIOGRAPHY;  Surgeon: Burnell Blanks, MD;  Location: [facility] CV LAB;  Service: Cardiovascular;  Laterality: N/A;  . SKIN CANCER EXCISION Bilateral    "have had them cut off back of neck X 2; off left upper arm; right wrist, near right shoulder blade" (06/08/2014)  . TEE WITHOUT CARDIOVERSION N/A 12/02/2017   Procedure: TRANSESOPHAGEAL ECHOCARDIOGRAM (TEE);  Surgeon: Burnell Blanks, MD;  Location: [facility];  Service: Open Heart Surgery;  Laterality: N/A;  . TEMPORARY PACEMAKER N/A 12/04/2017   Procedure: TEMPORARY PACEMAKER;  Surgeon: Leonie Man, MD;  Location: [facility] CV LAB;  Service: Cardiovascular;  Laterality: N/A;  . TRANSCATHETER AORTIC VALVE REPLACEMENT, TRANSFEMORAL N/A 12/02/2017   Procedure: TRANSCATHETER AORTIC VALVE REPLACEMENT, TRANSFEMORAL;  Surgeon: Burnell Blanks, MD;  Location: [facility];  Service: Open Heart Surgery;  Laterality: N/A;  using Edwards Sapien 3 Transcatheter Heart Valve size 29mm    There were no vitals filed for this visit.  Subjective Assessment - 01/26/19 1455    Subjective  Went for the 3 days without the liner/prosthesis with the area  almost gone. Put prosthesis back on Sunday long enough to go to [employer] with buring and place on limb coming back. No falls.    Pertinent History  L TTA, CAD, PAF,  pacemaker, DM2, neuropathy, CKD, HTN,     Limitations  Lifting;Standing;Walking;House hold activities    Patient Stated Goals  To use prosthesis in community, travel (uses [employer]), take of himself so wife does not have to do it. Housework.     Currently in Pain?  No/denies    Pain Score  0-No pain           [facility] Adult PT Treatment/Exercise - 01/26/19 1502      Transfers   Transfers  Sit to Stand;Stand to Sit    Sit to Stand  6: Modified independent (Device/Increase time)    Stand to Sit  6: Modified independent (Device/Increase time)      Ambulation/Gait   Ambulation/Gait  Yes    Ambulation/Gait Assistance  5: Supervision;4: Min assist;3: Mod assist    Ambulation/Gait Assistance Details  supervision with RW with cues on posture and equal stance time on each leg. with cane: increased assist needed with cane distance progressed due to fatigue. pt did report incr burning senstaion with last gait rep as well. cues for incr base of support, cane placement, upright trunk posture and for incr step length.     Ambulation Distance (Feet)  230 Feet   x1; 25x1, 42 x1, 54 x1 all with cane   Assistive device  Prosthesis;Rolling walker;Straight cane   cane with rubber quad tip   Gait Pattern  Step-through pattern;Decreased stride length;Decreased trunk rotation;Trunk flexed    Ambulation Surface  Level;Indoor      Prosthetics   Prosthetic Care Comments   pt advised to call [facility] about skin issues that reaccured after taking a break when he resumed wearing prosthesis. noted to have shrinker fibers on wound under Tegaderm. Removed Tegaderm and fibers, then recovered wound with Tegaderm .    Current prosthetic wear tolerance (days/week)   daily     Current prosthetic wear tolerance (#hours/day)   most of awake hours, drying 2 times during day.     Residual limb condition   wound on lateral tibial plateau appears to be healing.     Education Provided  Residual limb care;Proper wear schedule/adjustment;Proper weight-bearing schedule/adjustment   call [facility]) Educated  Patient    Education Method  Explanation;Demonstration;Verbal cues    Education Method  Verbalized understanding;Returned demonstration;Verbal cues required;Needs further instruction    Donning Prosthesis  Supervision    Doffing Prosthesis  Modified independent (device/increased time)               PT Short Term Goals - 01/06/19 1217      PT SHORT TERM GOAL #1   Title  Patient verbalizes proper sock management & skin management with no wounds on limb. (All updated STGs Target Date 01/29/2019)    Status  New    Target Date  01/29/19      PT SHORT TERM GOAL #2   Title  Patient tolerates prosthesis wear >12 hrs total/day with limb pain </= 2/10 & no wounds.     Status  New    Target Date  01/29/19      PT SHORT TERM GOAL #3   Title  Patient negotiates ramps & curbs with rollator walker & prosthesis with supervision.     Status  Revised    Target Date  01/29/19      PT SHORT TERM GOAL #4   Title  Patient ambulates 50' with cane & moderate hand hold assist.     Baseline        Status  Revised    Target Date  01/29/19        PT Long Term Goals - 12/01/18 1900      PT LONG TERM GOAL #1   Title  Patient verbalizes & demonstrates proper prosthetic care including donning / doffing independently to enable safe use of prosthesis (All LTGs Target Date: 02/26/2019)    Time  3    Period  Months    Status  On-going    Target Date  02/26/19      PT LONG TERM GOAL #2   Title  Patient tolerates prosthesis wear daily for >90% of awake hours without skin or limb pain issues to enable function throughout his day.     Time  3    Period  Months    Status  On-going  Target Date  02/26/19      PT LONG TERM GOAL #3   Title  Berg Balance >/= 45/56  to indicate lower fall risk & less dependency in standing ADLs.     Time  3    Period  Months    Status  Revised    Target Date  02/26/19      PT LONG TERM GOAL #4   Title  Patient ambulates 600' with RW (or rollator) & prosthesis outdoors on paved or grass up to 50' surfaces modified independent for community mobility.     Time  3    Period  Months    Status  Revised    Target Date  02/26/19      PT LONG TERM GOAL #5   Title  Patient negotiates ramps, curbs with walker & stairs 1 rail /cane modified independent for community access.     Time  3    Period  Months    Status  On-going    Target Date  02/26/19      Additional Long Term Goals   Additional Long Term Goals  Yes      PT LONG TERM GOAL #6   Title  Patient ambulates 29' around furniture with cane & prosthesis modified independent for household mobility.     Time  3    Period  Months    Status  New    Target Date  02/26/19            Plan - 01/26/19 1458    Clinical Impression Statement  Today's skilled session focused on limb management and continued to focus on gait with prosthesis/RW/cane. Increased burning/discomfort reported with last gait rep with cane, otherwise no issues reported. The pt is making slow, steady progress toward goals.     Rehab Potential  Good    PT Frequency  2x / week    PT Duration  Other (comment)   90 days (13 weeks)   PT Treatment/Interventions  ADLs/Self Care Home Management;Canalith Repostioning;DME Instruction;Gait training;Stair training;Functional mobility training;Therapeutic activities;Therapeutic exercise;Balance training;Neuromuscular re-education;Patient/family education;Prosthetic Training;Manual techniques;Vestibular    PT Next Visit Plan  STG due by 3/20.  montior residual limb wound, continue to work on gait with cane for short distances, rollator for longer/community distances - ramp and curb, balance reactions.  Start to discuss options for community fitness after D/C     Consulted and Agree with Plan of Care  Patient       Patient will benefit from skilled therapeutic intervention in order to improve the following deficits and impairments:  Abnormal gait, Decreased activity tolerance, Decreased balance, Decreased endurance, Decreased knowledge of use of DME, Decreased mobility, Impaired flexibility, Decreased range of motion, Decreased strength, Decreased skin integrity, Dizziness, Postural dysfunction, Prosthetic Dependency  Visit Diagnosis: Other abnormalities of gait and mobility  Muscle weakness (generalized)  Unsteadiness on feet  Abnormal posture     Problem List Patient Active Problem List   Diagnosis Date Noted  . Controlled type 2 diabetes mellitus without complication ([place]) 02/77/4128  . Bradycardia 09/01/2018  . Gangrene of left foot ([place]) 05/28/2018  . 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 Obstructive sleep apnea syndrome 05/04/2018  . Obstructive sleep apnea treated with continuous positive airway pressure (CPAP) 05/04/2018  . Type II diabetes mellitus, uncontrolled ([place]) 04/22/2018  . Atherosclerosis of native arteries of the extremities with gangrene ([place]) 04/09/2018  . Atherosclerosis of native artery of extremity (  [place]) 04/09/2018  . Cellulitis in diabetic foot ([place]) 04/04/2018  . Paroxysmal atrial fibrillation ([place]) 12/05/2017  . Cardiac pacemaker in situ   . Symptomatic bradycardia   . Asystole ([place])   . 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 History of aortic valve replacement 12/02/2017  . Severe aortic stenosis   . Bilateral impacted cerumen 01/22/2016  . Essential hypertension 11/14/2015  . Peripheral vascular disease ([place]) 05/24/2014  . Chronic kidney disease 08/30/2013  . Hypothyroidism 08/30/2013  . 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 Diabetes mellitus type 2, uncomplicated ([place])   . Aortic valve stenosis 07/25/2011  .  Hyperlipidemia 01/10/2011  . CAD (coronary artery disease) 01/10/2011  . Coronary arteriosclerosis 01/10/2011    Willow Ora, PTA, [facility] [address], [place], [place] [address] [phone] 01/26/19, 8:28 PM   Name: Steven Maldonado MRN: [id number] Date of Birth: Jan 20, 1941

## 2019-01-29 ENCOUNTER — Ambulatory Visit: Payer: Medicare Other | Admitting: Physical Therapy

## 2019-01-30 ENCOUNTER — Other Ambulatory Visit: Payer: Self-pay | Admitting: Endocrinology

## 2019-02-01 ENCOUNTER — Telehealth: Payer: Self-pay | Admitting: Physical Therapy

## 2019-02-01 NOTE — Telephone Encounter (Signed)
Steven Maldonado was contacted today regarding the temporary closing of [facility] due to Covid-19.  Therapist discussed current home program and pt reports it is going well. He reports doing laps in his driveway with the rollator to work on his endurance. Advised pt that if any other schedule changes are to occur the front office will be in touch with him. The pt verbalized understanding.     [facility] will follow up with patients when we are able to resume care.  Willow Ora, PTA, [facility] [address] [place], [place] [address] [phone] 02/01/19, 2:43 PM   [facility] [address] [place], [place]  [address] Phone:  [phone] Fax:  [phone] \

## 2019-02-02 ENCOUNTER — Ambulatory Visit: Payer: Medicare Other | Admitting: Physical Therapy

## 2019-02-05 ENCOUNTER — Encounter: Payer: Medicare Other | Admitting: Physical Therapy

## 2019-02-09 ENCOUNTER — Encounter: Payer: Medicare Other | Admitting: Physical Therapy

## 2019-02-11 ENCOUNTER — Telehealth: Payer: Self-pay | Admitting: Physical Therapy

## 2019-02-11 NOTE — Telephone Encounter (Signed)
Steven Maldonado was contacted today regarding temporary reduction of [facility] due to concerns for community transmission of COVID-19.  Patient identity was verified.  Assessed if patient needed to be seen in person by clinician (recent fall or acute injury that requires hands on assessment and advice, change in diet order, post-surgical, special cases, etc.).    Patient did not have an acute/special need that requires in person visit. Proceeded with phone call.  Therapist advised the patient to continue to perform his walking program and HEP and assured he had no unanswered questions or concerns at this time.  The patient was offered and declined the continuation of their plan of care by using methods such as an E-Visit, virtual check in, or Telehealth visit.  [facility] will follow up with this client when we are able to safely resume care at the [facility] in person.  Patient is aware we can be reached by telephone during limited business hours in the meantime.   Rico Junker, PT, DPT 02/11/19    12:40 PM

## 2019-02-12 ENCOUNTER — Encounter: Payer: Medicare Other | Admitting: Physical Therapy

## 2019-02-16 ENCOUNTER — Encounter: Payer: Medicare Other | Admitting: Physical Therapy

## 2019-02-18 ENCOUNTER — Encounter: Payer: Medicare Other | Admitting: Physical Therapy

## 2019-02-23 ENCOUNTER — Encounter: Payer: Medicare Other | Admitting: Physical Therapy

## 2019-02-23 ENCOUNTER — Telehealth: Payer: Self-pay | Admitting: *Deleted

## 2019-02-23 NOTE — Telephone Encounter (Signed)
Called patient to let them know due to recent COVID19 CDC and Health Department Protocols, we are not seeing patients in the office. We are instead seeing if they would like to schedule this appointment as a Virtual Appointment VIA Smartphone or Laptop. Unable to reach patient. LVMTCB  

## 2019-02-23 NOTE — Telephone Encounter (Signed)
New Message   Patient returning your call states they have smart phone so they can do a virtual visit please give him a call back.

## 2019-02-23 NOTE — Telephone Encounter (Signed)
Returning patient's call.  LVMTCB tomorrow.

## 2019-02-24 NOTE — Telephone Encounter (Signed)
New Message ° ° °Patient returning your phone call. °

## 2019-02-25 NOTE — Telephone Encounter (Signed)
Returned patient's call. LVMTCB

## 2019-02-26 ENCOUNTER — Encounter: Payer: Medicare Other | Admitting: Physical Therapy

## 2019-02-26 NOTE — Telephone Encounter (Signed)
Virtual Visit Pre-Appointment Phone Call  Steps For Call:  1. Confirm consent - "In the setting of the current Covid19 crisis, you are scheduled for a (phone or video) visit with your provider on (date) at (time).  Just as we do with many in-office visits, in order for you to participate in this visit, we must obtain consent.  If you'd like, I can send this to your mychart (if signed up) or email for you to review.  Otherwise, I can obtain your verbal consent now.  All virtual visits are billed to your insurance company just like a normal visit would be.  By agreeing to a virtual visit, we'd like you to understand that the technology does not allow for your provider to perform an examination, and thus may limit your provider's ability to fully assess your condition. If your provider identifies any concerns that need to be evaluated in person, we will make arrangements to do so.  Finally, though the technology is pretty good, we cannot assure that it will always work on either your or our end, and in the setting of a video visit, we may have to convert it to a phone-only visit.  In either situation, we cannot ensure that we have a secure connection.  Are you willing to proceed?" STAFF: Did the patient verbally acknowledge consent to telehealth visit? Document YES/NO here: YES  2. Confirm the BEST phone number to call the day of the visit by including in appointment notes  3. Give patient instructions for WebEx/MyChart download to smartphone as below or Doximity/Doxy.me if video visit (depending on what platform provider is using)  4. Advise patient to be prepared with their blood pressure, heart rate, weight, any heart rhythm information, their current medicines, and a piece of paper and pen handy for any instructions they may receive the day of their visit  5. Inform patient they will receive a phone call 15 minutes prior to their appointment time (may be from unknown caller ID) so they should be  prepared to answer  6. Confirm that appointment type is correct in Epic appointment notes (VIDEO vs PHONE)     TELEPHONE CALL NOTE  Steven Maldonado has been deemed a candidate for a follow-up tele-health visit to limit community exposure during the Covid-19 pandemic. I spoke with the patient via phone to ensure availability of phone/video source, confirm preferred email & phone number, and discuss instructions and expectations.  I reminded Steven Maldonado to be prepared with any vital sign and/or heart rhythm information that could potentially be obtained via home monitoring, at the time of his visit. I reminded Steven Maldonado to expect a phone call at the time of his visit if his visit.  Steven Maldonado 02/26/2019 4:33 PM   INSTRUCTIONS FOR DOWNLOADING THE [place] APP TO SMARTPHONE  - If Apple, ask patient to go to [employer] and type in WebEx in the search bar. [place] [employer], the blue/green circle. If Android, go to [employer] and type in [employer] in the search bar. The app is free but as with any other app downloads, their phone may require them to verify saved payment information or Apple/Android password.  - The patient does NOT have to create an account. - On the day of the visit, the assist will walk the patient through joining the meeting with the meeting number/password.  INSTRUCTIONS FOR DOWNLOADING THE MYCHART APP TO SMARTPHONE  - The patient must first make sure  to have activated MyChart and know their login information - If Apple, go to [employer] and type in MyChart in the search bar and download the app. If Android, ask patient to go to [employer] and type in [place] in the search bar and download the app. The app is free but as with any other app downloads, their phone may require them to verify saved payment information or Apple/Android password.  - The patient will need to then log into the app with their MyChart username and password, and select [facility] as their healthcare provider to link the account. When it is time for your visit, go to the MyChart app, find appointments, and click Begin Video Visit. Be sure to Select Allow for your device to access the Microphone and Camera for your visit. You will then be connected, and your provider will be with you shortly.  **If they have any issues connecting, or need assistance please contact MyChart service desk [phone])**  **If using a computer, in order to ensure the best quality for their visit they will need to use either of the following Internet Browsers: [employer], or Google Chrome**  IF USING DOXIMITY or DOXY.ME - The patient will receive a link just prior to their visit, either by text or email (to be determined day of appointment depending on if it's doxy.me or Doximity).     FULL LENGTH CONSENT FOR TELE-HEALTH VISIT   I hereby voluntarily request, consent and authorize [facility] and its employed or contracted physicians, physician assistants, nurse practitioners or other licensed health care professionals (the Practitioner), to provide me with telemedicine health care services (the "Services") as deemed necessary by the treating Practitioner. I acknowledge and consent to receive the Services by the Practitioner via telemedicine. I understand that the telemedicine visit will involve communicating with the Practitioner through live audiovisual communication technology and the disclosure of certain medical information by electronic transmission. I acknowledge that I have been given the opportunity to request an in-person assessment or other available alternative prior to the telemedicine visit and am voluntarily participating in the telemedicine visit.  I understand that I have the right to withhold or withdraw my consent to the use of telemedicine in the course of my care at any time, without affecting my right to future care or treatment, and that the  Practitioner or I may terminate the telemedicine visit at any time. I understand that I have the right to inspect all information obtained and/or recorded in the course of the telemedicine visit and may receive copies of available information for a reasonable fee.  I understand that some of the potential risks of receiving the Services via telemedicine include:  [name] Kitchen Delay or interruption in medical evaluation due to technological equipment failure or disruption; . Information transmitted may not be sufficient (e.g. poor resolution of images) to allow for appropriate medical decision making by the Practitioner; and/or  . In rare instances, security protocols could fail, causing a breach of personal health information.  Furthermore, I acknowledge that it is my responsibility to provide information about my medical history, conditions and care that is complete and accurate to the best of my ability. I acknowledge that Practitioner's advice, recommendations, and/or decision may be based on factors not within their control, such as incomplete or inaccurate data provided by me or distortions of diagnostic images or specimens that may result from electronic transmissions. I understand that the practice of medicine is not an exact science  and that Practitioner makes no warranties or guarantees regarding treatment outcomes. I acknowledge that I will receive a copy of this consent concurrently upon execution via email to the email address I last provided but may also request a printed copy by calling the office of [facility].    I understand that my insurance will be billed for this visit.   I have read or had this consent read to me. . I understand the contents of this consent, which adequately explains the benefits and risks of the Services being provided via telemedicine.  . I have been provided ample opportunity to ask questions regarding this consent and the Services and have had my questions answered to my  satisfaction. . I give my informed consent for the services to be provided through the use of telemedicine in my medical care  By participating in this telemedicine visit I agree to the above. PATIENT AND WIFE VERBALLY AGREED

## 2019-03-02 ENCOUNTER — Encounter: Payer: Self-pay | Admitting: Cardiology

## 2019-03-02 ENCOUNTER — Other Ambulatory Visit: Payer: Self-pay

## 2019-03-02 ENCOUNTER — Telehealth (INDEPENDENT_AMBULATORY_CARE_PROVIDER_SITE_OTHER): Payer: Medicare Other | Admitting: Cardiology

## 2019-03-02 ENCOUNTER — Ambulatory Visit: Payer: Medicare Other | Admitting: Cardiovascular Disease

## 2019-03-02 DIAGNOSIS — I480 Paroxysmal atrial fibrillation: Secondary | ICD-10-CM

## 2019-03-02 NOTE — Progress Notes (Signed)
Electrophysiology TeleHealth Note   Due to national recommendations of social distancing due to COVID 19, an audio/video telehealth visit is felt to be most appropriate for this patient at this time.  See Epic message for the patient's consent to telehealth for [facility].   Date:  03/02/2019   ID:  Steven, Maldonado 26-Sep-1941, MRN [id number]  Location: patient's home  Provider location: [address] [name]  Evaluation Performed: Follow-up visit  PCP:  Susy Frizzle, MD  Cardiologist:  [name], MD  Electrophysiologist:  Dr Curt Bears  Chief Complaint:  AF  History of Present Illness:    Steven Maldonado is a 78 y.o. male who presents via audio/video conferencing for a telehealth visit today.  Since last being seen in our clinic, the patient reports doing very well.  Today, he denies symptoms of palpitations, chest pain, shortness of breath,  lower extremity edema, dizziness, presyncope, or syncope.  The patient is otherwise without complaint today.  The patient denies symptoms of fevers, chills, cough, or new SOB worrisome for COVID 19.   Today, denies symptoms of palpitations, chest pain, shortness of breath, orthopnea, PND, lower extremity edema, claudication, dizziness, presyncope, syncope, bleeding, or neurologic sequela. The patient is tolerating medications without difficulties.  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  He is overall feeling well.  He has no chest pain or shortness of breath.  Is able to do all of his daily activities.  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  Despite that, he is able to get outside and walk.  He is walking up to half a mile a day.  Past Medical History:  Diagnosis Date  . Chronic diastolic CHF (congestive heart failure) ([place])   . CKD (chronic kidney disease), stage III ([place])   . Constipation   . Coronary artery  disease    a. Cath February 2012 in [place], occluded RCA with collaterals  . DM type 2 (diabetes mellitus, type 2) ([place])   . Essential hypertension   . Hyperlipidemia   . Neuropathy    feet  . Pacemaker    a. symptomatic brady after TAVR s/p MDT PPM by Dr. Curt Bears 12/04/17  . Persistent atrial fibrillation   . PONV (postoperative nausea and vomiting)    after valve surgery  . PVD (peripheral vascular disease) ([place])    a. s/p R popliteal artery stenosis tx with drug-coated balloon 05/2014, followed by Dr. Fletcher Anon.  . 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 Severe aortic stenosis    a. 12/02/17: s/p TAVR  . Skin cancer   . Sleep apnea with use of continuous positive airway pressure (CPAP)    04-11-11 AHI was 32.9 and titrated to 15 cm H20, DME is [facility]  . Subclinical hypothyroidism     Past Surgical History:  Procedure Laterality Date  . ABDOMINAL ANGIOGRAM N/A 06/08/2014   Procedure: ABDOMINAL ANGIOGRAM;  Surgeon: [place] Hampshire, MD;  Location: [facility] CATH LAB;  Service: Cardiovascular;  Laterality: N/A;  . ABDOMINAL AORTOGRAM N/A 04/09/2018   Procedure: ABDOMINAL AORTOGRAM;  Surgeon: Conrad Eureka, MD;  Location: [place] CV LAB;  Service: Cardiovascular;  Laterality: N/A;  . AMPUTATION Left 04/17/2018   Procedure: LEFT FOOT 3RD RAY AMPUTATION;  Surgeon: Newt Minion, MD;  Location: [place];  Service: Orthopedics;  Laterality: Left;  . AMPUTATION Left 05/28/2018   Procedure: LEFT AMPUTATION BELOW KNEE;  Surgeon: Wylene Simmer, MD;  Location: [facility];  Service: Orthopedics;  Laterality: Left;  . APPENDECTOMY  1965  . 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 CARDIAC CATHETERIZATION  12/2010  . CARDIOVERSION  07/2011  . CARDIOVERSION N/A 04/18/2014   Procedure: CARDIOVERSION;  Surgeon: Dorothy Spark, MD;  Location: [place];  Service: Cardiovascular;   Laterality: N/A;  . CARDIOVERSION N/A 11/03/2015   Procedure: CARDIOVERSION;  Surgeon: Lelon Perla, MD;  Location[facility] ENDOSCOPY;  Service: Cardiovascular;  Laterality: N/A;  . CARDIOVERSION N/A 05/08/2017   Procedure: CARDIOVERSION;  Surgeon: Dorothy Spark, MD;  Location: [facility] ENDOSCOPY;  Service: Cardiovascular;  Laterality: N/A;  . CARDIOVERSION N/A 07/28/2017   Procedure: CARDIOVERSION;  Surgeon: Dorothy Spark, MD;  Location: [place];  Service: Cardiovascular;  Laterality: N/A;  . LOWER EXTREMITY ANGIOGRAM N/A 06/08/2014   Procedure: LOWER EXTREMITY ANGIOGRAM;  Surgeon: [place] Hampshire, MD;  Location: [facility] CATH LAB;  Service: Cardiovascular;  Laterality: N/A;  . LOWER EXTREMITY ANGIOGRAPHY Left 04/09/2018   Procedure: Lower Extremity Angiography;  Surgeon: Conrad Ogden, MD;  Location: [place] CV LAB;  Service: Cardiovascular;  Laterality: Left;  . PACEMAKER IMPLANT N/A 12/04/2017   Procedure: PACEMAKER IMPLANT;  Surgeon: Constance Haw, MD;  Location: [place] CV LAB;  Service: Cardiovascular;  Laterality: N/A;  . PERIPHERAL VASCULAR BALLOON ANGIOPLASTY Left 04/09/2018   Procedure: PERIPHERAL VASCULAR BALLOON ANGIOPLASTY;  Surgeon: Conrad White Stone, MD;  Location: [place] CV LAB;  Service: Cardiovascular;  Laterality: Left;  SFA  . POPLITEAL ARTERY ANGIOPLASTY Right 06/08/2014   Archie Endo 06/08/2014  . RIGHT/LEFT HEART CATH AND CORONARY ANGIOGRAPHY N/A 10/08/2017   Procedure: RIGHT/LEFT HEART CATH AND CORONARY ANGIOGRAPHY;  Surgeon: Burnell Blanks, MD;  Location: [place] CV LAB;  Service: Cardiovascular;  Laterality: N/A;  . SKIN CANCER EXCISION Bilateral    "have had them cut off back of neck X 2; off left upper arm; right wrist, near right shoulder blade" (06/08/2014)  . TEE WITHOUT CARDIOVERSION N/A 12/02/2017   Procedure: TRANSESOPHAGEAL ECHOCARDIOGRAM (TEE);  Surgeon: Burnell Blanks, MD;  Location: [facility];  Service: Open Heart Surgery;   Laterality: N/A;  . TEMPORARY PACEMAKER N/A 12/04/2017   Procedure: TEMPORARY PACEMAKER;  Surgeon: Leonie Man, MD;  Location: [place] CV LAB;  Service: Cardiovascular;  Laterality: N/A;  . TRANSCATHETER AORTIC VALVE REPLACEMENT, TRANSFEMORAL N/A 12/02/2017   Procedure: TRANSCATHETER AORTIC VALVE REPLACEMENT, TRANSFEMORAL;  Surgeon: Burnell Blanks, MD;  Location: [place];  Service: Open Heart Surgery;  Laterality: N/A;  using Edwards Sapien 3 Transcatheter Heart Valve size 52mm    Current Outpatient Medications  Medication Sig Dispense Refill  . amiodarone (PACERONE) 200 MG tablet TAKE 1 TABLET BY MOUTH EVERY DAY 90 tablet 3  . atorvastatin (LIPITOR) 80 MG tablet Take 1 tablet (80 mg total) by mouth at bedtime. 90 tablet 3  . Blood Glucose Monitoring Suppl (FREESTYLE LITE) DEVI 1 each by Does not apply route 2 (two) times daily. 1 each 0  . Choline Fenofibrate (FENOFIBRIC ACID) 135 MG CPDR TAKE 1 CAPSULE DAILY 90 capsule 3  . dabigatran (PRADAXA) 150 MG CAPS capsule Take 150 mg by mouth every 12 (twelve) hours.     . docusate sodium (COLACE) 100 MG capsule Take 100 mg by mouth 2 (two) times daily.    . furosemide (LASIX)  20 MG tablet Take 1 tablet (20 mg total) by mouth daily. (Patient taking differently: Take 20 mg by mouth every Monday, Wednesday, and Friday. ) 90 tablet 3  . gabapentin (NEURONTIN) 300 MG capsule Take 1 capsule (300 mg total) by mouth 3 (three) times daily. 90 capsule 5  . glipiZIDE (GLUCOTROL XL) 10 MG 24 hr tablet TAKE 1 TABLET (10 MG TOTAL) BY MOUTH DAILY WITH BREAKFAST. 90 tablet 1  . glucose blood (FREESTYLE LITE) test strip 1 each by Other route 4 (four) times daily -  before meals and at bedtime. 450 each 3  . insulin lispro (HUMALOG KWIKPEN) 100 UNIT/ML KwikPen Inject 5 units in the skin three times daily with meals (Patient taking differently: Inject 5 Units into the skin 2 (two) times daily. Inject 5 units in the skin twice a day with breakfast and  lunch.) 15 pen 2  . Insulin Pen Needle (BD PEN NEEDLE NANO U/F) 32G X 4 MM MISC USE 4 (FOUR) TIMES DAILY. 1000 each 0  . levothyroxine (SYNTHROID, LEVOTHROID) 175 MCG tablet TAKE 1 TABLET BY MOUTH EVERY DAY 90 tablet 1  . metoprolol tartrate (LOPRESSOR) 50 MG tablet TAKE ONE AND ONE-HALF TABLET BY MOUTH TWICE A DAY 270 tablet 2  . mirabegron ER (MYRBETRIQ) 25 MG TB24 tablet Take 1 tablet (25 mg total) by mouth daily. 90 tablet 3  . pentoxifylline (TRENTAL) 400 MG CR tablet Take 1 tablet (400 mg total) by mouth 3 (three) times daily with meals. 90 tablet 3  . TRESIBA FLEXTOUCH 200 UNIT/ML SOPN INJECT 66 UNITS INTO THE SKIN AT BEDTIME. 15 pen 2   No current facility-administered medications for this visit.     Allergies:   Patient has no known allergies.   Social History:  The patient  reports that he quit smoking about 46 years ago. His smoking use included cigarettes. He has a 28.00 pack-year smoking history. He has never used smokeless tobacco. He reports that he does not drink alcohol or use drugs.   Family History:  The patient's  family history includes Diabetes in his brother, daughter, father, mother, sister, and another family member; Heart Problems in his daughter; Heart attack in his father and mother; Heart failure in his father; Hypertension in his brother, father, mother, and sister; Stroke in his brother.   ROS:  Please see the history of present illness.   All other systems are personally reviewed and negative.    Exam:    Vital Signs:  BP (!) 151/72   Pulse 60   Wt 229 lb (103.9 kg)   BMI 31.94 kg/m   Well appearing, alert and conversant, regular work of breathing,  good skin color Eyes- anicteric, neuro- grossly intact, skin- no apparent rash or lesions or cyanosis, mouth- oral mucosa is pink   Labs/Other Tests and Data Reviewed:    Recent Labs: 05/31/2018: Hemoglobin 9.6; Platelets 271 01/25/2019: ALT 31; BUN 26; Creatinine, Ser 1.42; Potassium 4.2; Sodium 138; TSH  3.77   Wt Readings from Last 3 Encounters:  03/02/19 229 lb (103.9 kg)  01/25/19 235 lb (106.6 kg)  01/19/19 235 lb 9 oz (106.8 kg)     Other studies personally reviewed: Additional studies/ records that were reviewed today include: ECG 09/01/18 - personally reviewed  Review of the above records today demonstrates:  AV paced  Last device remote is reviewed from [facility] PDF dated 12/28/18 which reveals normal device function, no arrhythmias    ASSESSMENT & PLAN:    1.  Complete heart block: Status post Medtronic dual-chamber pacemaker implanted 11/24/2017.  Device functioning appropriately.  No changes at this time.  2.  Paroxysmal atrial fibrillation: Currently on Pradaxa and amiodarone.  Maintaining sinus rhythm.  No changes.  3.  Coronary artery disease: No current chest pain.  Continue current management.  4.  Hypertension: Blood pressure is elevated on his home checks.  Blood pressure has been more normal on checks in the clinic.  Due to that, I Mayan Kloepfer not make any further changes to his blood pressure medications.  5.  Hyperlipidemia: Continue statin per primary cardiology  6.  OSA: Continue CPAP, compliance encouraged   COVID 19 screen The patient denies symptoms of COVID 19 at this time.  The importance of social distancing was discussed today.  Follow-up: 1 year Next remote: 03/29/19  Current medicines are reviewed at length with the patient today.   The patient does not have concerns regarding his medicines.  The following changes were made today:  none  Labs/ tests ordered today include:  No orders of the defined types were placed in this encounter.    Patient Risk:  after full review of this patients clinical status, I feel that they are at moderate risk at this time.  Today, I have spent 12 minutes with the patient with telehealth technology discussing pacemaker, atrial fibrillation.    Signed, Anaria Kroner Meredith Leeds, MD  03/02/2019 10:51 AM     [facility]  [address] [place] [address] [phone] (office) [phone] (fax)

## 2019-03-10 DIAGNOSIS — H40033 Anatomical narrow angle, bilateral: Secondary | ICD-10-CM | POA: Diagnosis not present

## 2019-03-10 DIAGNOSIS — H40031 Anatomical narrow angle, right eye: Secondary | ICD-10-CM | POA: Diagnosis not present

## 2019-03-10 DIAGNOSIS — H2513 Age-related nuclear cataract, bilateral: Secondary | ICD-10-CM | POA: Diagnosis not present

## 2019-03-10 DIAGNOSIS — H43393 Other vitreous opacities, bilateral: Secondary | ICD-10-CM | POA: Diagnosis not present

## 2019-03-10 DIAGNOSIS — H25013 Cortical age-related cataract, bilateral: Secondary | ICD-10-CM | POA: Diagnosis not present

## 2019-03-10 DIAGNOSIS — H40013 Open angle with borderline findings, low risk, bilateral: Secondary | ICD-10-CM | POA: Diagnosis not present

## 2019-03-15 ENCOUNTER — Other Ambulatory Visit (HOSPITAL_COMMUNITY): Payer: Self-pay | Admitting: Physician Assistant

## 2019-03-15 DIAGNOSIS — I6523 Occlusion and stenosis of bilateral carotid arteries: Secondary | ICD-10-CM

## 2019-03-17 ENCOUNTER — Ambulatory Visit: Payer: Self-pay | Admitting: *Deleted

## 2019-03-17 DIAGNOSIS — H40032 Anatomical narrow angle, left eye: Secondary | ICD-10-CM | POA: Diagnosis not present

## 2019-03-29 ENCOUNTER — Ambulatory Visit (INDEPENDENT_AMBULATORY_CARE_PROVIDER_SITE_OTHER): Payer: Medicare Other | Admitting: *Deleted

## 2019-03-29 ENCOUNTER — Other Ambulatory Visit: Payer: Self-pay

## 2019-03-29 DIAGNOSIS — I442 Atrioventricular block, complete: Secondary | ICD-10-CM

## 2019-03-30 LAB — CUP PACEART REMOTE DEVICE CHECK
Battery Remaining Longevity: 115 mo
Battery Voltage: 2.97 V
Brady Statistic AP VP Percent: 67.64 %
Brady Statistic AP VS Percent: 0.01 %
Brady Statistic AS VP Percent: 32.07 %
Brady Statistic AS VS Percent: 0.28 %
Brady Statistic RA Percent Paced: 67.72 %
Brady Statistic RV Percent Paced: 99.71 %
Date Time Interrogation Session: 20200518042907
Implantable Lead Implant Date: 20190124
Implantable Lead Implant Date: 20190124
Implantable Lead Location: 753859
Implantable Lead Location: 753860
Implantable Lead Model: 5076
Implantable Lead Model: 5076
Implantable Pulse Generator Implant Date: 20190124
Lead Channel Impedance Value: 323 Ohm
Lead Channel Impedance Value: 342 Ohm
Lead Channel Impedance Value: 361 Ohm
Lead Channel Impedance Value: 437 Ohm
Lead Channel Pacing Threshold Amplitude: 0.625 V
Lead Channel Pacing Threshold Amplitude: 0.75 V
Lead Channel Pacing Threshold Pulse Width: 0.4 ms
Lead Channel Pacing Threshold Pulse Width: 0.4 ms
Lead Channel Sensing Intrinsic Amplitude: 2.125 mV
Lead Channel Sensing Intrinsic Amplitude: 2.125 mV
Lead Channel Sensing Intrinsic Amplitude: 24.875 mV
Lead Channel Sensing Intrinsic Amplitude: 7.75 mV
Lead Channel Setting Pacing Amplitude: 2 V
Lead Channel Setting Pacing Amplitude: 2.5 V
Lead Channel Setting Pacing Pulse Width: 0.4 ms
Lead Channel Setting Sensing Sensitivity: 4 mV

## 2019-04-01 DIAGNOSIS — H2513 Age-related nuclear cataract, bilateral: Secondary | ICD-10-CM | POA: Diagnosis not present

## 2019-04-01 DIAGNOSIS — H25013 Cortical age-related cataract, bilateral: Secondary | ICD-10-CM | POA: Diagnosis not present

## 2019-04-01 DIAGNOSIS — H2511 Age-related nuclear cataract, right eye: Secondary | ICD-10-CM | POA: Diagnosis not present

## 2019-04-01 DIAGNOSIS — E113393 Type 2 diabetes mellitus with moderate nonproliferative diabetic retinopathy without macular edema, bilateral: Secondary | ICD-10-CM | POA: Diagnosis not present

## 2019-04-01 DIAGNOSIS — H31011 Macula scars of posterior pole (postinflammatory) (post-traumatic), right eye: Secondary | ICD-10-CM | POA: Diagnosis not present

## 2019-04-01 LAB — HM DIABETES EYE EXAM

## 2019-04-06 DIAGNOSIS — H2511 Age-related nuclear cataract, right eye: Secondary | ICD-10-CM | POA: Diagnosis not present

## 2019-04-06 DIAGNOSIS — H268 Other specified cataract: Secondary | ICD-10-CM | POA: Diagnosis not present

## 2019-04-07 ENCOUNTER — Encounter: Payer: Self-pay | Admitting: Cardiology

## 2019-04-07 NOTE — Progress Notes (Signed)
Remote pacemaker transmission.   

## 2019-04-08 ENCOUNTER — Other Ambulatory Visit: Payer: Self-pay

## 2019-04-08 DIAGNOSIS — I779 Disorder of arteries and arterioles, unspecified: Secondary | ICD-10-CM

## 2019-04-09 ENCOUNTER — Other Ambulatory Visit: Payer: Self-pay | Admitting: *Deleted

## 2019-04-09 ENCOUNTER — Telehealth (HOSPITAL_COMMUNITY): Payer: Self-pay | Admitting: Rehabilitation

## 2019-04-09 NOTE — Telephone Encounter (Signed)

## 2019-04-09 NOTE — Patient Outreach (Signed)
[facility]) Care Management  04/09/2019  MERVILLE HIJAZI [birth year] [id number]   RN Health Coach attempted #1 follow up outreach call to patient.  Patient was unavailable. HIPPA compliance voicemail message left with return callback number.  Plan: RN will call patient again within 30 days.  [place] Care Management [phone]

## 2019-04-12 ENCOUNTER — Ambulatory Visit (HOSPITAL_COMMUNITY)
Admission: RE | Admit: 2019-04-12 | Discharge: 2019-04-12 | Disposition: A | Discharge status: Home / Self Care | Payer: Medicare Other | Source: Ambulatory Visit | Attending: Family | Admitting: Family

## 2019-04-12 ENCOUNTER — Ambulatory Visit (INDEPENDENT_AMBULATORY_CARE_PROVIDER_SITE_OTHER)
Admission: RE | Admit: 2019-04-12 | Discharge: 2019-04-12 | Disposition: A | Discharge status: Home / Self Care | Payer: Medicare Other | Source: Ambulatory Visit | Attending: Family | Admitting: Family

## 2019-04-12 ENCOUNTER — Ambulatory Visit (INDEPENDENT_AMBULATORY_CARE_PROVIDER_SITE_OTHER): Payer: Medicare Other | Admitting: Family

## 2019-04-12 ENCOUNTER — Encounter: Payer: Self-pay | Admitting: Family

## 2019-04-12 ENCOUNTER — Other Ambulatory Visit: Payer: Self-pay

## 2019-04-12 VITALS — BP 141/73 | HR 60 | Temp 97.7°F | Resp 18 | Ht 71.0 in | Wt 229.0 lb

## 2019-04-12 DIAGNOSIS — I779 Disorder of arteries and arterioles, unspecified: Secondary | ICD-10-CM

## 2019-04-12 DIAGNOSIS — E1151 Type 2 diabetes mellitus with diabetic peripheral angiopathy without gangrene: Secondary | ICD-10-CM | POA: Diagnosis not present

## 2019-04-12 DIAGNOSIS — Z89512 Acquired absence of left leg below knee: Secondary | ICD-10-CM

## 2019-04-12 DIAGNOSIS — I6523 Occlusion and stenosis of bilateral carotid arteries: Secondary | ICD-10-CM | POA: Diagnosis not present

## 2019-04-12 DIAGNOSIS — Z87891 Personal history of nicotine dependence: Secondary | ICD-10-CM | POA: Diagnosis not present

## 2019-04-12 NOTE — Patient Instructions (Signed)
Stroke Prevention Some medical conditions and lifestyle choices can lead to a higher risk for a stroke. You can help to prevent a stroke by making nutrition, lifestyle, and other changes. What nutrition changes can be made?   Eat healthy foods. ? Choose foods that are high in fiber. These include:  Fresh fruits.  Fresh vegetables.  Whole grains. ? Eat at least 5 or more servings of fruits and vegetables each day. Try to fill half of your plate at each meal with fruits and vegetables. ? Choose lean protein foods. These include:  Lowfat (lean) cuts of meat.  Chicken without skin.  Fish.  Tofu.  Beans.  Nuts. ? Eat low-fat dairy products. ? Avoid foods that:  Are high in salt (sodium).  Have saturated fat.  Have trans fat.  Have cholesterol.  Are processed.  Are premade.  Follow eating guidelines as told by your doctor. These may include: ? Reducing how many calories you eat and drink each day. ? Limiting how much salt you eat or drink each day to 1,500 milligrams (mg). ? Using only healthy fats for cooking. These include:  Olive oil.  Canola oil.  Sunflower oil. ? Counting how many carbohydrates you eat and drink each day. What lifestyle changes can be made?  Try to stay at a healthy weight. Talk to your doctor about what a good weight is for you.  Get at least 30 minutes of moderate physical activity at least 5 days a week. This can include: ? Fast walking. ? Biking. ? Swimming.  Do not use any products that have nicotine or tobacco. This includes cigarettes and e-cigarettes. If you need help quitting, ask your doctor. Avoid being around tobacco smoke in general.  Limit how much alcohol you drink to no more than 1 drink a day for nonpregnant women and 2 drinks a day for men. One drink equals 12 oz of beer, 5 oz of wine, or 1 oz of hard liquor.  Do not use drugs.  Avoid taking birth control pills. Talk to your doctor about the risks of taking birth  control pills if: ? You are over 35 years old. ? You smoke. ? You get migraines. ? You have had a blood clot. What other changes can be made?  Manage your cholesterol. ? It is important to eat a healthy diet. ? If your cholesterol cannot be managed through your diet, you may also need to take medicines. Take medicines as told by your doctor.  Manage your diabetes. ? It is important to eat a healthy diet and to exercise regularly. ? If your blood sugar cannot be managed through diet and exercise, you may need to take medicines. Take medicines as told by your doctor.  Control your high blood pressure (hypertension). ? Try to keep your blood pressure below 130/80. This can help lower your risk of stroke. ? It is important to eat a healthy diet and to exercise regularly. ? If your blood pressure cannot be managed through diet and exercise, you may need to take medicines. Take medicines as told by your doctor. ? Ask your doctor if you should check your blood pressure at home. ? Have your blood pressure checked every year. Do this even if your blood pressure is normal.  Talk to your doctor about getting checked for a sleep disorder. Signs of this can include: ? Snoring a lot. ? Feeling very tired.  Take over-the-counter and prescription medicines only as told by your doctor. These may   include aspirin or blood thinners (antiplatelets or anticoagulants).  Make sure that any other medical conditions you have are managed. Where to find more information  American Stroke Association: www.strokeassociation.org  National Stroke Association: www.stroke.org Get help right away if:  You have any symptoms of stroke. "BE FAST" is an easy way to remember the main warning signs: ? B - Balance. Signs are dizziness, sudden trouble walking, or loss of balance. ? E - Eyes. Signs are trouble seeing or a sudden change in how you see. ? F - Face. Signs are sudden weakness or loss of feeling of the face,  or the face or eyelid drooping on one side. ? A - Arms. Signs are weakness or loss of feeling in an arm. This happens suddenly and usually on one side of the body. ? S - Speech. Signs are sudden trouble speaking, slurred speech, or trouble understanding what people say. ? T - Time. Time to call emergency services. Write down what time symptoms started.  You have other signs of stroke, such as: ? A sudden, very bad headache with no known cause. ? Feeling sick to your stomach (nausea). ? Throwing up (vomiting). ? Jerky movements you cannot control (seizure). These symptoms may represent a serious problem that is an emergency. Do not wait to see if the symptoms will go away. Get medical help right away. Call your local emergency services (911 in the [place] Do not drive yourself to the hospital. Summary  You can prevent a stroke by eating healthy, exercising, not smoking, drinking less alcohol, and treating other health problems, such as diabetes, high blood pressure, or high cholesterol.  Do not use any products that contain nicotine or tobacco, such as cigarettes and e-cigarettes.  Get help right away if you have any signs or symptoms of a stroke. This information is not intended to replace advice given to you by your health care provider. Make sure you discuss any questions you have with your health care provider. Document Released: 04/28/2012 Document Revised: 01/29/2017 Document Reviewed: 01/29/2017 [employer] Interactive Patient Education  2019 [employer]     Peripheral Vascular Disease  Peripheral vascular disease (PVD) is a disease of the blood vessels that are not part of your heart and brain. A simple term for PVD is poor circulation. In most cases, PVD narrows the blood vessels that carry blood from your heart to the rest of your body. This can reduce the supply of blood to your arms, legs, and internal organs, like your stomach or kidneys. However, PVD most often affects a  person's lower legs and feet. Without treatment, PVD tends to get worse. PVD can also lead to acute ischemic limb. This is when an arm or leg suddenly cannot get enough blood. This is a medical emergency. Follow these instructions at home: Lifestyle  Do not use any products that contain nicotine or tobacco, such as cigarettes and e-cigarettes. If you need help quitting, ask your doctor.  Lose weight if you are overweight. Or, stay at a healthy weight as told by your doctor.  Eat a diet that is low in fat and cholesterol. If you need help, ask your doctor.  Exercise regularly. Ask your doctor for activities that are right for you. General instructions  Take over-the-counter and prescription medicines only as told by your doctor.  Take good care of your feet: ? Wear comfortable shoes that fit well. ? Check your feet often for any cuts or sores.  Keep all follow-up visits as   told by your doctor This is important. Contact a doctor if:  You have cramps in your legs when you walk.  You have leg pain when you are at rest.  You have coldness in a leg or foot.  Your skin changes.  You are unable to get or have an erection (erectile dysfunction).  You have cuts or sores on your feet that do not heal. Get help right away if:  Your arm or leg turns cold, numb, and blue.  Your arms or legs become red, warm, swollen, painful, or numb.  You have chest pain.  You have trouble breathing.  You suddenly have weakness in your face, arm, or leg.  You become very confused or you cannot speak.  You suddenly have a very bad headache.  You suddenly cannot see. Summary  Peripheral vascular disease (PVD) is a disease of the blood vessels.  A simple term for PVD is poor circulation. Without treatment, PVD tends to get worse.  Treatment may include exercise, low fat and low cholesterol diet, and quitting smoking. This information is not intended to replace advice given to you by your  health care provider. Make sure you discuss any questions you have with your health care provider. Document Released: 01/22/2010 Document Revised: 12/05/2016 Document Reviewed: 12/05/2016 [employer] Interactive Patient Education  2019 [employer]  

## 2019-04-12 NOTE — Progress Notes (Signed)
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 Doran Durand for LLEcritical limb ischemia withgangrenous L foot.   The patient underwent attempt at endovascular recannulation of L pop artery. Dr. Bridgett Larsson, stented the L SFA to try to improve collateral flow to the left foot on 04/09/18.  The patient underwent L 3rd ray amp with Dr. Sharol Given on 04/17/18. This did not heal and ischemia progressed. Dr. Sharol Given was recommending L BKA.  Pt was last evaluated by Dr. Bridgett Larsson on 05-27-18. At that time pt was scheduled for L BKA the next day. Pt has a popliteal CTO, so L BKA stump would be persisting on lateral geniculate collateral, so the perfusion for the BKA may be tenuous. Based on the patient's vascular studies and examination, Dr. Bridgett Larsson offered the patient:R ABI in 3 months.  Pt had an MI in 2012, just before he retired. He also has a pacemaker. His cardiologist is Dr. Julianne Handler, electrophysiologist is Dr. Curt Bears.   Pt denies any known hx of stroke or TIA.   He reports that he had several episodes of atrial fib in the past, states he no longer has this.   After walking a moderate distance, he states his right leg feels tired, denies pain with walking; denies non healing wounds in his right foot or leg, denies problems with his left BKA stump.   He uses C-PAP for his OSA.   He walks a half mile daily, wearing his left BKA prosthesis and using a walker with a seat.   Stage 3a CKD, GFR was 49 on 01-25-19.   He has intentionally lost weight, and he was able to decrease his insulin use.   Diabetic: Yes, 5.9 A1C on 11-23-18, improved from 11.6 about March of 2019 Tobacco use: former smoker, quit in 1974, smoked 2 ppd x 14 years. He states he gained 40 pounds after he quit smoking.   Pt  meds include: Statin :Yes Betablocker: Yes ASA: No Other anticoagulants/antiplatelets: Pradaxa, hx of atrial fib   Current Outpatient Medications  Medication Sig Dispense Refill  . amiodarone (PACERONE) 200 MG tablet TAKE 1 TABLET BY MOUTH EVERY DAY 90 tablet 3  . atorvastatin (LIPITOR) 80 MG tablet Take 1 tablet (80 mg total) by mouth at bedtime. 90 tablet 3  . Blood Glucose Monitoring Suppl (FREESTYLE LITE) DEVI 1 each by Does not apply route 2 (two) times daily. 1 each 0  . Choline Fenofibrate (FENOFIBRIC ACID) 135 MG CPDR TAKE 1 CAPSULE DAILY 90 capsule 3  . dabigatran (PRADAXA) 150 MG CAPS capsule Take 150 mg by mouth every 12 (twelve) hours.     . docusate sodium (COLACE) 100 MG capsule Take 100 mg by mouth 2 (two) times daily.    . furosemide (LASIX) 20 MG tablet Take 1 tablet (20 mg total) by mouth daily. (Patient taking differently: Take 20 mg by mouth every Monday, Wednesday, and Friday. ) 90 tablet 3  . gabapentin (NEURONTIN) 300 MG capsule Take 1 capsule (300 mg total) by mouth 3 (three) times daily. 90 capsule 5  . glipiZIDE (GLUCOTROL XL) 10 MG 24 hr tablet TAKE 1 TABLET (10 MG TOTAL) BY MOUTH DAILY WITH BREAKFAST. 90 tablet 1  . glucose blood (FREESTYLE LITE) test strip 1 each by Other route 4 (four) times  daily -  before meals and at bedtime. 450 each 3  . insulin lispro (HUMALOG KWIKPEN) 100 UNIT/ML KwikPen Inject 5 units in the skin three times daily with meals (Patient taking differently: Inject 5 Units into the skin 2 (two) times daily. Inject 5 units in the skin twice a day with breakfast and lunch.) 15 pen 2  . Insulin Pen Needle (BD PEN NEEDLE NANO U/F) 32G X 4 MM MISC USE 4 (FOUR) TIMES DAILY. 1000 each 0  . levothyroxine (SYNTHROID, LEVOTHROID) 175 MCG tablet TAKE 1 TABLET BY MOUTH EVERY DAY 90 tablet 1  . metoprolol tartrate (LOPRESSOR) 50 MG tablet TAKE ONE AND ONE-HALF TABLET BY MOUTH TWICE A DAY 270 tablet 2  . mirabegron ER (MYRBETRIQ) 25 MG TB24 tablet Take 1  tablet (25 mg total) by mouth daily. 90 tablet 3  . pentoxifylline (TRENTAL) 400 MG CR tablet Take 1 tablet (400 mg total) by mouth 3 (three) times daily with meals. 90 tablet 3  . TRESIBA FLEXTOUCH 200 UNIT/ML SOPN INJECT 66 UNITS INTO THE SKIN AT BEDTIME. 15 pen 2   No current facility-administered medications for this visit.     Past Medical History:  Diagnosis Date  . Chronic diastolic CHF (congestive heart failure) ([place])   . CKD (chronic kidney disease), stage III ([place])   . Constipation   . Coronary artery disease    a. Cath February 2012 in [facility], occluded RCA with collaterals  . DM type 2 (diabetes mellitus, type 2) ([place])   . Essential hypertension   . Hyperlipidemia   . Neuropathy    feet  . Pacemaker    a. symptomatic brady after TAVR s/p MDT PPM by Dr. Curt Bears 12/04/17  . Persistent atrial fibrillation   . PONV (postoperative nausea and vomiting)    after valve surgery  . PVD (peripheral vascular disease) ([place])    a. s/p R popliteal artery stenosis tx with drug-coated balloon 05/2014, followed by Dr. Fletcher Anon.  . 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 Severe aortic stenosis    a. 12/02/17: s/p TAVR  . Skin cancer   . Sleep apnea with use of continuous positive airway pressure (CPAP)    04-11-11 AHI was 32.9 and titrated to 15 cm H20, DME is [facility]  . Subclinical hypothyroidism     Social History Social History   Tobacco Use  . Smoking status: Former Smoker    Packs/day: 2.00    Years: 14.00    Pack years: 28.00    Types: Cigarettes    Last attempt to quit: 11/11/1972    Years since quitting: 46.4  . Smokeless tobacco: Never Used  Substance Use Topics  . Alcohol use: No    Comment: quit in 1984  . Drug use: No    Family History Family History  Problem Relation Age of Onset  . Diabetes Mother   . Heart attack Mother   . Hypertension  Mother   . Heart attack Father   . Heart failure Father   . Hypertension Father   . Diabetes Father   . Diabetes Sister   . Diabetes Brother   . Diabetes Other   . Diabetes Daughter        TYPE ll  . Heart Problems Daughter   . Hypertension Sister   . Hypertension Brother   . Stroke Brother     Surgical History  Past Surgical History:  Procedure Laterality Date  . ABDOMINAL ANGIOGRAM N/A 06/08/2014   Procedure: ABDOMINAL ANGIOGRAM;  Surgeon: Wellington Hampshire, MD;  Location: [facility] CATH LAB;  Service: Cardiovascular;  Laterality: N/A;  . ABDOMINAL AORTOGRAM N/A 04/09/2018   Procedure: ABDOMINAL AORTOGRAM;  Surgeon: Conrad Milwaukee, MD;  Location: [place] CV LAB;  Service: Cardiovascular;  Laterality: N/A;  . AMPUTATION Left 04/17/2018   Procedure: LEFT FOOT 3RD RAY AMPUTATION;  Surgeon: Newt Minion, MD;  Location: [place];  Service: Orthopedics;  Laterality: Left;  . AMPUTATION Left 05/28/2018   Procedure: LEFT AMPUTATION BELOW KNEE;  Surgeon: Wylene Simmer, MD;  Location: [place];  Service: Orthopedics;  Laterality: Left;  . APPENDECTOMY  1965  . 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 CARDIAC CATHETERIZATION  12/2010  . CARDIOVERSION  07/2011  . CARDIOVERSION N/A 04/18/2014   Procedure: CARDIOVERSION;  Surgeon: Dorothy Spark, MD;  Location: [place];  Service: Cardiovascular;  Laterality: N/A;  . CARDIOVERSION N/A 11/03/2015   Procedure: CARDIOVERSION;  Surgeon: Lelon Perla, MD;  Location[facility] ENDOSCOPY;  Service: Cardiovascular;  Laterality: N/A;  . CARDIOVERSION N/A 05/08/2017   Procedure: CARDIOVERSION;  Surgeon: Dorothy Spark, MD;  Location: [facility] ENDOSCOPY;  Service: Cardiovascular;  Laterality: N/A;  . CARDIOVERSION N/A 07/28/2017   Procedure: CARDIOVERSION;  Surgeon: Dorothy Spark, MD;  Location: [place];  Service: Cardiovascular;  Laterality: N/A;  . LOWER EXTREMITY ANGIOGRAM N/A 06/08/2014   Procedure: LOWER EXTREMITY ANGIOGRAM;  Surgeon: Wellington Hampshire, MD;  Location: [facility] CATH LAB;  Service: Cardiovascular;  Laterality: N/A;  . LOWER EXTREMITY ANGIOGRAPHY Left 04/09/2018   Procedure: Lower Extremity Angiography;  Surgeon: Conrad , MD;  Location: [place] CV LAB;  Service: Cardiovascular;  Laterality: Left;  . PACEMAKER IMPLANT N/A 12/04/2017   Procedure: PACEMAKER IMPLANT;  Surgeon: Constance Haw, MD;  Location: [place] CV LAB;  Service: Cardiovascular;  Laterality: N/A;  . PERIPHERAL VASCULAR BALLOON ANGIOPLASTY Left 04/09/2018   Procedure: PERIPHERAL VASCULAR BALLOON ANGIOPLASTY;  Surgeon: Conrad , MD;  Location: [place] CV LAB;  Service: Cardiovascular;  Laterality: Left;  SFA  . POPLITEAL ARTERY ANGIOPLASTY Right 06/08/2014   Archie Endo 06/08/2014  . RIGHT/LEFT HEART CATH AND CORONARY ANGIOGRAPHY N/A 10/08/2017   Procedure: RIGHT/LEFT HEART CATH AND CORONARY ANGIOGRAPHY;  Surgeon: Burnell Blanks, MD;  Location: [place] CV LAB;  Service: Cardiovascular;  Laterality: N/A;  . SKIN CANCER EXCISION Bilateral    "have had them cut off back of neck X 2; off left upper arm; right wrist, near right shoulder blade" (06/08/2014)  . TEE WITHOUT CARDIOVERSION N/A 12/02/2017   Procedure: TRANSESOPHAGEAL ECHOCARDIOGRAM (TEE);  Surgeon: Burnell Blanks, MD;  Location: [place];  Service: Open Heart Surgery;  Laterality: N/A;  . TEMPORARY PACEMAKER N/A 12/04/2017   Procedure: TEMPORARY PACEMAKER;  Surgeon: Leonie Man, MD;  Location: [place] CV LAB;  Service: Cardiovascular;  Laterality: N/A;  . TRANSCATHETER AORTIC VALVE REPLACEMENT, TRANSFEMORAL N/A 12/02/2017   Procedure: TRANSCATHETER AORTIC VALVE REPLACEMENT, TRANSFEMORAL;  Surgeon: Burnell Blanks, MD;  Location: [place];  Service: Open Heart Surgery;  Laterality: N/A;  using Edwards Sapien 3 Transcatheter Heart Valve size 35mm    No Known Allergies  Current Outpatient Medications  Medication Sig Dispense Refill  . amiodarone (PACERONE) 200  MG tablet TAKE 1 TABLET BY MOUTH EVERY DAY 90 tablet 3  . atorvastatin (LIPITOR) 80 MG tablet Take 1 tablet (80 mg total) by  mouth at bedtime. 90 tablet 3  . Blood Glucose Monitoring Suppl (FREESTYLE LITE) DEVI 1 each by Does not apply route 2 (two) times daily. 1 each 0  . Choline Fenofibrate (FENOFIBRIC ACID) 135 MG CPDR TAKE 1 CAPSULE DAILY 90 capsule 3  . dabigatran (PRADAXA) 150 MG CAPS capsule Take 150 mg by mouth every 12 (twelve) hours.     . docusate sodium (COLACE) 100 MG capsule Take 100 mg by mouth 2 (two) times daily.    . furosemide (LASIX) 20 MG tablet Take 1 tablet (20 mg total) by mouth daily. (Patient taking differently: Take 20 mg by mouth every Monday, Wednesday, and Friday. ) 90 tablet 3  . gabapentin (NEURONTIN) 300 MG capsule Take 1 capsule (300 mg total) by mouth 3 (three) times daily. 90 capsule 5  . glipiZIDE (GLUCOTROL XL) 10 MG 24 hr tablet TAKE 1 TABLET (10 MG TOTAL) BY MOUTH DAILY WITH BREAKFAST. 90 tablet 1  . glucose blood (FREESTYLE LITE) test strip 1 each by Other route 4 (four) times daily -  before meals and at bedtime. 450 each 3  . insulin lispro (HUMALOG KWIKPEN) 100 UNIT/ML KwikPen Inject 5 units in the skin three times daily with meals (Patient taking differently: Inject 5 Units into the skin 2 (two) times daily. Inject 5 units in the skin twice a day with breakfast and lunch.) 15 pen 2  . Insulin Pen Needle (BD PEN NEEDLE NANO U/F) 32G X 4 MM MISC USE 4 (FOUR) TIMES DAILY. 1000 each 0  . levothyroxine (SYNTHROID, LEVOTHROID) 175 MCG tablet TAKE 1 TABLET BY MOUTH EVERY DAY 90 tablet 1  . metoprolol tartrate (LOPRESSOR) 50 MG tablet TAKE ONE AND ONE-HALF TABLET BY MOUTH TWICE A DAY 270 tablet 2  . mirabegron ER (MYRBETRIQ) 25 MG TB24 tablet Take 1 tablet (25 mg total) by mouth daily. 90 tablet 3  . pentoxifylline (TRENTAL) 400 MG CR tablet Take 1 tablet (400 mg total) by mouth 3 (three) times daily with meals. 90 tablet 3  . TRESIBA FLEXTOUCH 200 UNIT/ML SOPN  INJECT 66 UNITS INTO THE SKIN AT BEDTIME. 15 pen 2   No current facility-administered medications for this visit.      REVIEW OF SYSTEMS: See HPI for pertinent positives and negatives.  Physical Examination Vitals:   04/12/19 1357 04/12/19 1402  BP: (!) 120/55 (!) 141/73  Pulse: 60 60  Resp: 18   Temp: 97.7 F (36.5 C)   TempSrc: Oral   SpO2: 99%   Weight: 229 lb (103.9 kg)   Height: 5\' 11"  (1.803 m)    Body mass index is 31.94 kg/m.  General:  Obese male in NAD Gait: using rolling walker with a seat HENT: Large neck Eyes: Pupils equal Pulmonary: normal non-labored breathing, good air movement in all fields, CTAB, no rales, rhonchi, or wheezing Cardiac: RRR, no murmur detected. Pacemaker palpated left upper chest.   Abdomen: soft, NT, no masses palpated Skin: no rashes, no ulcers, no cellulitis.   VASCULAR EXAM  Carotid Bruits Right Left   Negative Negative      Radial pulses are 1+ palpable bilaterally   Adominal aortic pulse is not palpable                      VASCULAR EXAM: Extremities without ischemic changes, without Gangrene; without open wounds. Left BKA prosthesis in place.  LE Pulses Right Left       FEMORAL  1+ palpable  1+ palpable        POPLITEAL  not palpable   not palpable       POSTERIOR TIBIAL  not palpable   BKA        DORSALIS PEDIS      ANTERIOR TIBIAL not palpable  BKA     Musculoskeletal: no muscle wasting or atrophy; no peripheral edema. See Extremities.   Neurologic:  A&O X 3; appropriate affect, sensation is normal; speech is normal, CN 2-12 intact, pain and light touch intact in extremities, motor exam as listed above. Psychiatric: Normal thought content, mood appropriate to clinical situation.     DATA  Carotid Duplex (04-12-19): Right Carotid: Velocities in the right ICA are consistent with a 1-39% stenosis. Left  Carotid: Velocities in the left ICA are consistent with a 40-59% stenosis. Vertebrals:  Bilateral vertebral arteries demonstrate antegrade flow. Subclavians: Normal flow hemodynamics were seen in bilateral subclavian              arteries. No change from 04-23-18.    ABI (Date: 04/12/2019): +---------+------------------+-----+-------------------+--------+ Right    Rt Pressure (mmHg)IndexWaveform           Comment  +---------+------------------+-----+-------------------+--------+ Brachial 161                                                +---------+------------------+-----+-------------------+--------+ ATA      255               1.58 monophasic                  +---------+------------------+-----+-------------------+--------+ PTA      255               1.58 dampened monophasic         +---------+------------------+-----+-------------------+--------+ Great Toe19                0.12 Abnormal                    +---------+------------------+-----+-------------------+--------+  +--------+------------------+-----+--------+-------+ Left    Lt Pressure (mmHg)IndexWaveformComment +--------+------------------+-----+--------+-------+ [id number]                                    +--------+------------------+-----+--------+-------+  +-------+-----------+-----------+------------+------------+ ABI/TBIToday's ABIToday's TBIPrevious ABIPrevious TBI +-------+-----------+-----------+------------+------------+ Right  [place]         0.12       [place]          0.2          +-------+-----------+-----------+------------+------------+ Left   BKA                                            +-------+-----------+-----------+------------+------------+   ASSESSMENT:  Steven Maldonado is a 78 y.o. 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 Doran Durand for LLEcritical limb ischemia withgangrenous L foot.  Prior to this he underwent attempt at endovascular  recannulation of L pop artery. Dr. Bridgett Larsson stented the L SFA to try to improve collateral flow to the left foot on 04/09/18.   He had physical therapy to get used to his left BKA prosthesis, seeing  [facility], using a rolling walker with a seat.  He has no right leg claudication with walking, no signs of ischemia in his right LE.  Left BKA stump is well healed.   Right ABI today indicates noncompressible right lower extremity arteries, monophasic waveforms.  Extracranial carotid artery stenosis, no hx of stroke of TIA:  minimal right ICA stenosis and mild to moderate left ICA stenosis; no change compared to the exam in June 2019.    His atherosclerotic risk factors include Type 2 DM currently in good control (previously severely uncontrolled), 2 ppd smoker x 14 years (quit in 1974), CAD, CHF, CKD stage 3, obesity, and OSA.   He takes Pradaxa, has a hx of atrial fib. He takes a daily statin.     PLAN:   Based on the patient's vascular studies and examination, pt will return to clinic in 3 months with right ABI and right LE arterial duplex; carotid duplex in a year.   I advised him to notify us if he develops concerns re the circulation in his right foot or leg, of sores on his left BKA stump.   Continue walking at least a half mile daily.  I discussed in depth with the patient the nature of atherosclerosis, and emphasized the importance of maximal medical management including strict control of blood pressure, blood glucose, and lipid levels, obtaining regular exercise, and continued cessation of smoking.  The patient is aware that without maximal medical management the underlying atherosclerotic disease process will progress, limiting the benefit of any interventions.  The patient was given information about stroke prevention and what symptoms should prompt the patient to seek immediate medical care.  The patient was given information about PAD including signs, symptoms, treatment, what  symptoms should prompt the patient to seek immediate medical care, and risk reduction measures to take.  Thank you for allowing [place] to participate in this patient's care.  Clemon Chambers, RN, MSN, FNP-C [facility] Office: [phone]  Clinic MD: Trula Slade 04/12/2019 2:20 PM

## 2019-04-13 ENCOUNTER — Encounter: Payer: Self-pay | Admitting: Physical Therapy

## 2019-04-13 ENCOUNTER — Ambulatory Visit: Payer: Medicare Other | Attending: Student | Admitting: Physical Therapy

## 2019-04-13 DIAGNOSIS — M6249 Contracture of muscle, multiple sites: Secondary | ICD-10-CM | POA: Insufficient documentation

## 2019-04-13 DIAGNOSIS — R2681 Unsteadiness on feet: Secondary | ICD-10-CM

## 2019-04-13 DIAGNOSIS — Z9181 History of falling: Secondary | ICD-10-CM | POA: Diagnosis not present

## 2019-04-13 DIAGNOSIS — R293 Abnormal posture: Secondary | ICD-10-CM | POA: Insufficient documentation

## 2019-04-13 DIAGNOSIS — M6281 Muscle weakness (generalized): Secondary | ICD-10-CM | POA: Insufficient documentation

## 2019-04-13 DIAGNOSIS — R2689 Other abnormalities of gait and mobility: Secondary | ICD-10-CM | POA: Diagnosis not present

## 2019-04-13 NOTE — Therapy (Signed)
[name], [name], [address] Phone: [phone]   Fax:  [phone]  Physical Therapy Treatment & Reassessment  Patient Details  Name: Steven Maldonado MRN: [id number] Date of Birth: 1941/01/26 Referring Provider (PT): Mechele Claude, [place]   Encounter Date: 04/13/2019  PT End of Session - 04/13/19 1054    Visit Number  40    Number of Visits  53    Date for PT Re-Evaluation  07/09/19    Authorization Type  Medicare & Federal BCBS    Authorization Time Period  Medicare guidelines, Fed BCBS waves co-pay.  75 visit limit PT, OT & ST combined with 1 used.    Authorization - Visit Number  20    Authorization - Number of Visits  75    PT Start Time  0800    PT Stop Time  0845    PT Time Calculation (min)  45 min    Equipment Utilized During Treatment  Gait belt    Activity Tolerance  Patient tolerated treatment well;No increased pain;Patient limited by fatigue    Behavior During Therapy  [facility] for tasks assessed/performed       Past Medical History:  Diagnosis Date  . Chronic diastolic CHF (congestive heart failure) ([place])   . CKD (chronic kidney disease), stage III ([place])   . Constipation   . Coronary artery disease    a. Cath February 2012 in [place], occluded RCA with collaterals  . DM type 2 (diabetes mellitus, type 2) ([place])   . Essential hypertension   . Hyperlipidemia   . Neuropathy    feet  . Pacemaker    a. symptomatic brady after TAVR s/p MDT PPM by Dr. Curt Bears 12/04/17  . Persistent atrial fibrillation   . PONV (postoperative nausea and vomiting)    after valve surgery  . PVD (peripheral vascular disease) ([place])    a. s/p R popliteal artery stenosis tx with drug-coated balloon 05/2014, followed by Dr. Fletcher Anon.  . 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 Severe aortic  stenosis    a. 12/02/17: s/p TAVR  . Skin cancer   . Sleep apnea with use of continuous positive airway pressure (CPAP)    04-11-11 AHI was 32.9 and titrated to 15 cm H20, DME is [facility]  . Subclinical hypothyroidism     Past Surgical History:  Procedure Laterality Date  . ABDOMINAL ANGIOGRAM N/A 06/08/2014   Procedure: ABDOMINAL ANGIOGRAM;  Surgeon: Wellington Hampshire, MD;  Location: [facility] CATH LAB;  Service: Cardiovascular;  Laterality: N/A;  . ABDOMINAL AORTOGRAM N/A 04/09/2018   Procedure: ABDOMINAL AORTOGRAM;  Surgeon: Conrad [place], MD;  Location: [place] CV LAB;  Service: Cardiovascular;  Laterality: N/A;  . AMPUTATION Left 04/17/2018   Procedure: LEFT FOOT 3RD RAY AMPUTATION;  Surgeon: Newt Minion, MD;  Location: [place];  Service: Orthopedics;  Laterality: Left;  . AMPUTATION Left 05/28/2018   Procedure: LEFT AMPUTATION BELOW KNEE;  Surgeon: Wylene Simmer, MD;  Location: [place];  Service: Orthopedics;  Laterality: Left;  . APPENDECTOMY  1965  . 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 CARDIAC CATHETERIZATION  12/2010  . CARDIOVERSION  07/2011  . CARDIOVERSION N/A 04/18/2014   Procedure: CARDIOVERSION;  Surgeon: Dorothy Spark, MD;  Location: [facility] ENDOSCOPY;  Service: Cardiovascular;  Laterality: N/A;  . CARDIOVERSION N/A 11/03/2015  Procedure: CARDIOVERSION;  Surgeon: Lelon Perla, MD;  Location: [facility] ENDOSCOPY;  Service: Cardiovascular;  Laterality: N/A;  . CARDIOVERSION N/A 05/08/2017   Procedure: CARDIOVERSION;  Surgeon: Dorothy Spark, MD;  Location: [facility] ENDOSCOPY;  Service: Cardiovascular;  Laterality: N/A;  . CARDIOVERSION N/A 07/28/2017   Procedure: CARDIOVERSION;  Surgeon: Dorothy Spark, MD;  Location: [place];  Service: Cardiovascular;  Laterality: N/A;  . LOWER EXTREMITY ANGIOGRAM N/A 06/08/2014   Procedure: LOWER EXTREMITY ANGIOGRAM;  Surgeon: Wellington Hampshire, MD;  Location: [facility] CATH LAB;  Service: Cardiovascular;  Laterality: N/A;  . LOWER EXTREMITY ANGIOGRAPHY  Left 04/09/2018   Procedure: Lower Extremity Angiography;  Surgeon: Conrad [place], MD;  Location: [place] CV LAB;  Service: Cardiovascular;  Laterality: Left;  . PACEMAKER IMPLANT N/A 12/04/2017   Procedure: PACEMAKER IMPLANT;  Surgeon: Constance Haw, MD;  Location: [place] CV LAB;  Service: Cardiovascular;  Laterality: N/A;  . PERIPHERAL VASCULAR BALLOON ANGIOPLASTY Left 04/09/2018   Procedure: PERIPHERAL VASCULAR BALLOON ANGIOPLASTY;  Surgeon: Conrad [place], MD;  Location: [place] CV LAB;  Service: Cardiovascular;  Laterality: Left;  SFA  . POPLITEAL ARTERY ANGIOPLASTY Right 06/08/2014   Archie Endo 06/08/2014  . RIGHT/LEFT HEART CATH AND CORONARY ANGIOGRAPHY N/A 10/08/2017   Procedure: RIGHT/LEFT HEART CATH AND CORONARY ANGIOGRAPHY;  Surgeon: Burnell Blanks, MD;  Location: [place] CV LAB;  Service: Cardiovascular;  Laterality: N/A;  . SKIN CANCER EXCISION Bilateral    "have had them cut off back of neck X 2; off left upper arm; right wrist, near right shoulder blade" (06/08/2014)  . TEE WITHOUT CARDIOVERSION N/A 12/02/2017   Procedure: TRANSESOPHAGEAL ECHOCARDIOGRAM (TEE);  Surgeon: Burnell Blanks, MD;  Location: [place];  Service: Open Heart Surgery;  Laterality: N/A;  . TEMPORARY PACEMAKER N/A 12/04/2017   Procedure: TEMPORARY PACEMAKER;  Surgeon: Leonie Man, MD;  Location: [place] CV LAB;  Service: Cardiovascular;  Laterality: N/A;  . TRANSCATHETER AORTIC VALVE REPLACEMENT, TRANSFEMORAL N/A 12/02/2017   Procedure: TRANSCATHETER AORTIC VALVE REPLACEMENT, TRANSFEMORAL;  Surgeon: Burnell Blanks, MD;  Location: [place];  Service: Open Heart Surgery;  Laterality: N/A;  using Edwards Sapien 3 Transcatheter Heart Valve size 20m    There were no vitals filed for this visit.  Subjective Assessment - 04/13/19 0801    Subjective  No falls. He is wearing prosthesis daily most of awake hours. No wounds but keeps wearing Tegaderm.      Pertinent History  L  TTA, CAD, PAF,  pacemaker, DM2, neuropathy, CKD, HTN,     Limitations  Lifting;Standing;Walking;House hold activities    Patient Stated Goals  To use prosthesis in community, travel (uses vLucianne Lei, take of himself so wife does not have to do it. Housework.     Currently in Pain?  No/denies         [facility]      Assessment   Medical Diagnosis  Left Transtibial Amputation      Transfers   Transfers  Sit to Stand;Stand to Sit    Sit to Stand  6: Modified independent (Device/Increase time);With upper extremity assist;With armrests;From chair/3-in-1   stabilizes without touching   Stand to Sit  6: Modified independent (Device/Increase time);With upper extremity assist;With armrests;To chair/3-in-1      Ambulation/Gait   Ambulation/Gait  Yes    Ambulation/Gait Assistance  4: Min assist;6: Modified independent (Device/Increase time);3: Mod assist   cane MinA straight path, ModA scan & turn, Mod ind rollator  Ambulation Distance (Feet)  600 Feet   600' rollator, 120' cane quad tip   Assistive device  Prosthesis;Rolling walker;Straight cane   straight cane quad tip   Gait Pattern  Step-through pattern;Decreased arm swing - left;Decreased step length - right;Decreased stance time - left;Decreased weight shift to left;Antalgic;Lateral hip instability;Abducted - left;Trunk flexed    Ambulation Surface  Indoor;Level    Gait velocity  rollator 3.30 ft/sec comfortable, 4.36 ft/sec fast pace,  1.46 ft/sec cane   09/02/18 Gait velocity 1.59 ft/sec   Ramp  3: Mod assist;6: Modified independent (Device)   ModA with cane & mod ind with rollator   Curb  3: Mod assist;6: Modified independent (Device/increase time)   ModA cane & Mod Ind with rollator     Berg Balance Test   Sit to Stand  Able to stand  independently using hands    Standing Unsupported  Able to stand safely 2 minutes    Sitting with Back Unsupported but Feet Supported on Floor or Stool  Able to sit safely and  securely 2 minutes    Stand to Sit  Sits safely with minimal use of hands    Transfers  Able to transfer safely, minor use of hands    Standing Unsupported with Eyes Closed  Able to stand 10 seconds safely    Standing Unsupported with Feet Together  Able to place feet together independently and stand 1 minute safely    From Standing, Reach Forward with Outstretched Arm  Can reach confidently >25 cm (10")    From Standing Position, Pick up Object from Floor  Able to pick up shoe, needs supervision    From Standing Position, Turn to Look Behind Over each Shoulder  Looks behind one side only/other side shows less weight shift    Turn 360 Degrees  Needs close supervision or verbal cueing    Standing Unsupported, Alternately Place Feet on Step/Stool  Able to complete >2 steps/needs minimal assist    Standing Unsupported, One Foot in Front  Able to take small step independently and hold 30 seconds    Standing on One Leg  Tries to lift leg/unable to hold 3 seconds but remains standing independently    Total Score  42    Berg comment:  <45/56 indicates high fall risk   10/23 eval 19/56, on 1/17 36/56     Prosthetics Assessment - 04/13/19 0800      Prosthetics   Prosthetic Care Independent with  Prosthetic cleaning;Proper wear schedule/adjustment    Prosthetic Care Dependent with  Skin check;Residual limb care;Correct ply sock adjustment    Donning prosthesis   Supervision    Doffing prosthesis   Modified independent (Device/Increase time)    Current prosthetic wear tolerance (days/week)   daily     Current prosthetic wear tolerance (#hours/day)   most of awake hours    Current prosthetic weight-bearing tolerance (hours/day)   Pt tolerated standing & gait for 5 min periods without pain or discomfort but muscle fatigue     Edema  non-pitting    Residual limb condition   previous wounds on medial proximal knee &  lateral tibial plateau are healed but using Tegaderm as protective barrier still since  skin is fragile.  Shiny skin with no hair growth with deep pinkness entire socket area.     K code/activity level with prosthetic use   K2 limted community with fixed cadence  [place] Adult PT Treatment/Exercise - 04/13/19 0800      Prosthetics   Education Provided  Skin check;Residual limb care;Correct ply sock adjustment    Person(s) Educated  Patient    Education Method  Explanation;Demonstration;Verbal cues    Education Method  Verbalized understanding;Needs further instruction               PT Short Term Goals - 04/13/19 1233      PT SHORT TERM GOAL #1   Title  Patient verbalizes proper sock management & skin management with no wounds on limb. (All updated STGs Target Date 05/13/2019)    Time  1    Period  Months    Status  On-going    Target Date  05/13/19      PT SHORT TERM GOAL #2   Title  Patient verbalizes & demonstrates understanding of updated HEP.     Time  1    Status  New    Target Date  05/13/19      PT SHORT TERM GOAL #3   Title  Patient negotiates ramps & curbs with straight cane quad tip & prosthesis with minA    Status  Revised    Target Date  05/13/19      PT SHORT TERM GOAL #4   Title  Patient ambulates 150' with straight cane quad tip with minA.     Baseline        Time  1    Period  Months    Status  Revised    Target Date  05/13/19        PT Long Term Goals - 04/13/19 1215      PT LONG TERM GOAL #1   Title  Patient verbalizes & demonstrates proper prosthetic care including donning / doffing independently to enable safe use of prosthesis (All LTGs Target Date: 02/26/2019)    Baseline  Progressing but not met 04/13/2019    Time  3    Period  Months    Status  Not Met      PT LONG TERM GOAL #2   Title  Patient tolerates prosthesis wear daily for >90% of awake hours without skin or limb pain issues to enable function throughout his day.     Baseline  Met 04/13/2019    Time  3    Period  Months    Status   Achieved      PT LONG TERM GOAL #3   Title  Berg Balance >/= 45/56 to indicate lower fall risk & less dependency in standing ADLs.     Baseline  Progressing but not met 04/13/2019  Berg improved to 42/56 from 19/56 initial eval 10/23/20919 & 36/56 on 11/27/2018    Time  3    Period  Months    Status  Not Met      PT LONG TERM GOAL #4   Title  Patient ambulates 600' with RW (or rollator) & prosthesis outdoors on paved or grass up to 50' surfaces modified independent for community mobility.     Baseline  Met 04/13/2019 with rollator walker    Time  3    Period  Months    Status  Achieved      PT LONG TERM GOAL #5   Title  Patient negotiates ramps, curbs with walker & stairs 1 rail /cane modified independent for community access.     Baseline  Met 04/13/2019 with rollator walker    Time  3  Period  Months    Status  Achieved      PT LONG TERM GOAL #6   Title  Patient ambulates 56' around furniture with cane & prosthesis modified independent for household mobility.     Baseline  Not Met 04/13/2019    Time  3    Period  Months    Status  Not Met        PT Long Term Goals - 04/13/19 1238      PT LONG TERM GOAL #1   Title  Patient verbalizes & demonstrates proper prosthetic care including donning / doffing & wear daily for >90% of awake hours without skin or limb pain issues independently to enable safe use of prosthesis (All LTGs Target Date: 07/09/2019)    Time  3    Period  Months    Status  On-going    Target Date  07/09/19      PT LONG TERM GOAL #2   Title  Patient demonstrates & verbalizes understanding of ongoing HEP / fitness plan.     Time  3    Period  Months    Status  New    Target Date  07/09/19      PT LONG TERM GOAL #3   Title  Berg Balance >/= 47/56 to indicate lower fall risk & less dependency in standing ADLs.     Time  3    Period  Months    Status  Revised    Target Date  07/09/19      PT LONG TERM GOAL #4   Title  Patient ambulates 250' with straight cane  quad tip & prosthesis modified independent.     Time  3    Period  Months    Status  New    Target Date  07/09/19      PT LONG TERM GOAL #5   Title  Patient negotiates ramps, curbs with straight cane quad tip & stairs 1 rail /cane modified independent for community access.     Time  3    Period  Months    Status  Achieved    Target Date  07/09/19      PT LONG TERM GOAL #6   Title  Dynamic Gait Index with straight cane quad tip & prosthesis >12/24.     Time  3    Period  Months    Status  New    Target Date  07/09/19      PT LONG TERM GOAL #7   Title  Gait Velocity with straight cane quad tip & prosthesis >1.8 ft/sec    Time  3    Period  Months    Status  New    Target Date  07/09/19            Plan - 04/13/19 1219    Clinical Impression Statement  Patient was last seen for PT on 01/26/2019 and PT was held due to [place] distancing /stay at home. He was reassessed today by PT. He met 3 of 6 LTGs and progressed towards remaining 3. He is safely functioning with prosthesis & rollator walker at basic community level. He has potential to use cane in home & limited community with additional PT training.     Rehab Potential  Good    PT Frequency  1x / week    PT Duration  12 weeks    PT Treatment/Interventions  ADLs/Self Care Home Management;Canalith Repostioning;DME Instruction;Gait training;Stair training;Functional  mobility training;Therapeutic activities;Therapeutic exercise;Balance training;Neuromuscular re-education;Patient/family education;Prosthetic Training;Manual techniques;Vestibular    PT Next Visit Plan  balance & gait with cane quad tip including ramps & curbs, [place]TAGO HEP    Consulted and Agree with Plan of Care  Patient       Patient will benefit from skilled therapeutic intervention in order to improve the following deficits and impairments:  Abnormal gait, Decreased activity tolerance, Decreased balance, Decreased endurance, Decreased knowledge of use of  DME, Decreased mobility, Impaired flexibility, Decreased range of motion, Decreased strength, Decreased skin integrity, Dizziness, Postural dysfunction, Prosthetic Dependency  Visit Diagnosis: Other abnormalities of gait and mobility  Muscle weakness (generalized)  Unsteadiness on feet  Abnormal posture  Contracture of muscle, multiple sites  History of falling     Problem List Patient Active Problem List   Diagnosis Date Noted  . Controlled type 2 diabetes mellitus without complication ([place]) 94/83/4758  . Bradycardia 09/01/2018  . Gangrene of left foot ([place]) 05/28/2018  . 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 Obstructive sleep apnea syndrome 05/04/2018  . Obstructive sleep apnea treated with continuous positive airway pressure (CPAP) 05/04/2018  . Type II diabetes mellitus, uncontrolled ([place]) 04/22/2018  . Atherosclerosis of native arteries of the extremities with gangrene ([place]) 04/09/2018  . Atherosclerosis of native artery of extremity ([place]) 04/09/2018  . Cellulitis in diabetic foot ([place]) 04/04/2018  . Paroxysmal atrial fibrillation ([place]) 12/05/2017  . Cardiac pacemaker in situ   . Symptomatic bradycardia   . Asystole ([place])   . 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 History of aortic valve replacement 12/02/2017  . Severe aortic stenosis   . Bilateral impacted cerumen 01/22/2016  . Essential hypertension 11/14/2015  . Peripheral vascular disease ([place]) 05/24/2014  . Chronic kidney disease 08/30/2013  . Hypothyroidism 08/30/2013  . 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 Diabetes mellitus type 2, uncomplicated ([place])   . Aortic valve stenosis 07/25/2011  . Hyperlipidemia 01/10/2011  . CAD (coronary artery disease) 01/10/2011  . Coronary arteriosclerosis 01/10/2011    Jamey Reas PT, DPT 04/13/2019, 12:38 PM  [facility] [address] [place], [name], [address] Phone: [phone]   Fax:  [phone]  Name: Steven Maldonado MRN: [id number] Date of Birth: 1941-04-18

## 2019-04-20 ENCOUNTER — Other Ambulatory Visit: Payer: Self-pay

## 2019-04-20 ENCOUNTER — Encounter: Payer: Self-pay | Admitting: Physical Therapy

## 2019-04-20 ENCOUNTER — Ambulatory Visit: Payer: Medicare Other | Admitting: Physical Therapy

## 2019-04-20 DIAGNOSIS — R2681 Unsteadiness on feet: Secondary | ICD-10-CM | POA: Diagnosis not present

## 2019-04-20 DIAGNOSIS — R293 Abnormal posture: Secondary | ICD-10-CM

## 2019-04-20 DIAGNOSIS — Z9181 History of falling: Secondary | ICD-10-CM

## 2019-04-20 DIAGNOSIS — M6249 Contracture of muscle, multiple sites: Secondary | ICD-10-CM

## 2019-04-20 DIAGNOSIS — M6281 Muscle weakness (generalized): Secondary | ICD-10-CM | POA: Diagnosis not present

## 2019-04-20 DIAGNOSIS — R2689 Other abnormalities of gait and mobility: Secondary | ICD-10-CM | POA: Diagnosis not present

## 2019-04-20 NOTE — Therapy (Signed)
[facility] [address] [place], [name], [address] Phone: [phone]   Fax:  [phone]  Physical Therapy Treatment  Patient Details  Name: Steven Maldonado MRN: [id number] Date of Birth: 1940-12-12 Referring Provider (PT): Mechele Claude, [place]   Encounter Date: 04/20/2019   CLINIC OPERATION CHANGES: [facility] is open at lower capacity following universal masking, social distancing, and patient screening.  The patient's COVID risk of complications score is 7. Patient seen at 7am during low volume time.   PT End of Session - 04/20/19 0748    Visit Number  41    Number of Visits  53    Date for PT Re-Evaluation  07/09/19    Authorization Type  Medicare & Federal BCBS    Authorization Time Period  Medicare guidelines, Fed BCBS waves co-pay.  75 visit limit PT, OT & ST combined with 1 used.    Authorization - Visit Number  21    Authorization - Number of Visits  75    PT Start Time  0706    PT Stop Time  0750    PT Time Calculation (min)  44 min    Equipment Utilized During Treatment  Gait belt    Activity Tolerance  Patient tolerated treatment well;No increased pain;Patient limited by fatigue    Behavior During Therapy  [facility] for tasks assessed/performed       Past Medical History:  Diagnosis Date  . Chronic diastolic CHF (congestive heart failure) ([place])   . CKD (chronic kidney disease), stage III ([place])   . Constipation   . Coronary artery disease    a. Cath February 2012 in [facility], occluded RCA with collaterals  . DM type 2 (diabetes mellitus, type 2) ([place])   . Essential hypertension   . Hyperlipidemia   . Neuropathy    feet  . Pacemaker    a. symptomatic brady after TAVR s/p MDT PPM by Dr. Curt Bears 12/04/17  . Persistent atrial fibrillation   . PONV (postoperative nausea and vomiting)    after valve surgery  . PVD (peripheral vascular disease) ([place])    a. s/p R popliteal artery stenosis tx with  drug-coated balloon 05/2014, followed by Dr. Fletcher Anon.  . 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 Severe aortic stenosis    a. 12/02/17: s/p TAVR  . Skin cancer   . Sleep apnea with use of continuous positive airway pressure (CPAP)    04-11-11 AHI was 32.9 and titrated to 15 cm H20, DME is [facility]  . Subclinical hypothyroidism     Past Surgical History:  Procedure Laterality Date  . ABDOMINAL ANGIOGRAM N/A 06/08/2014   Procedure: ABDOMINAL ANGIOGRAM;  Surgeon: Wellington Hampshire, MD;  Location: [facility] CATH LAB;  Service: Cardiovascular;  Laterality: N/A;  . ABDOMINAL AORTOGRAM N/A 04/09/2018   Procedure: ABDOMINAL AORTOGRAM;  Surgeon: Conrad Collyer, MD;  Location: [place] CV LAB;  Service: Cardiovascular;  Laterality: N/A;  . AMPUTATION Left 04/17/2018   Procedure: LEFT FOOT 3RD RAY AMPUTATION;  Surgeon: Newt Minion, MD;  Location: [place];  Service: Orthopedics;  Laterality: Left;  . AMPUTATION Left 05/28/2018   Procedure: LEFT AMPUTATION BELOW KNEE;  Surgeon: Wylene Simmer, MD;  Location: [place];  Service: Orthopedics;  Laterality: Left;  . APPENDECTOMY  1965  . 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 CARDIAC CATHETERIZATION  12/2010  . CARDIOVERSION  07/2011  . CARDIOVERSION N/A 04/18/2014   Procedure: CARDIOVERSION;  Surgeon: Dorothy Spark, MD;  Location: [place] [place];  Service: Cardiovascular;  Laterality: N/A;  . CARDIOVERSION N/A 11/03/2015   Procedure: CARDIOVERSION;  Surgeon: Lelon Perla, MD;  Location: [facility] ENDOSCOPY;  Service: Cardiovascular;  Laterality: N/A;  . CARDIOVERSION N/A 05/08/2017   Procedure: CARDIOVERSION;  Surgeon: Dorothy Spark, MD;  Location: [facility] ENDOSCOPY;  Service: Cardiovascular;  Laterality: N/A;  . CARDIOVERSION N/A 07/28/2017   Procedure: CARDIOVERSION;  Surgeon: Dorothy Spark, MD;  Location: [place];  Service:  Cardiovascular;  Laterality: N/A;  . LOWER EXTREMITY ANGIOGRAM N/A 06/08/2014   Procedure: LOWER EXTREMITY ANGIOGRAM;  Surgeon: Wellington Hampshire, MD;  Location: [facility] CATH LAB;  Service: Cardiovascular;  Laterality: N/A;  . LOWER EXTREMITY ANGIOGRAPHY Left 04/09/2018   Procedure: Lower Extremity Angiography;  Surgeon: Conrad Burke, MD;  Location: [place] CV LAB;  Service: Cardiovascular;  Laterality: Left;  . PACEMAKER IMPLANT N/A 12/04/2017   Procedure: PACEMAKER IMPLANT;  Surgeon: Constance Haw, MD;  Location: [place] CV LAB;  Service: Cardiovascular;  Laterality: N/A;  . PERIPHERAL VASCULAR BALLOON ANGIOPLASTY Left 04/09/2018   Procedure: PERIPHERAL VASCULAR BALLOON ANGIOPLASTY;  Surgeon: Conrad [place]atesville, MD;  Location: [place] CV LAB;  Service: Cardiovascular;  Laterality: Left;  SFA  . POPLITEAL ARTERY ANGIOPLASTY Right 06/08/2014   Archie Endo 06/08/2014  . RIGHT/LEFT HEART CATH AND CORONARY ANGIOGRAPHY N/A 10/08/2017   Procedure: RIGHT/LEFT HEART CATH AND CORONARY ANGIOGRAPHY;  Surgeon: Burnell Blanks, MD;  Location: [place] CV LAB;  Service: Cardiovascular;  Laterality: N/A;  . SKIN CANCER EXCISION Bilateral    "have had them cut off back of neck X 2; off left upper arm; right wrist, near right shoulder blade" (06/08/2014)  . TEE WITHOUT CARDIOVERSION N/A 12/02/2017   Procedure: TRANSESOPHAGEAL ECHOCARDIOGRAM (TEE);  Surgeon: Burnell Blanks, MD;  Location: [place];  Service: Open Heart Surgery;  Laterality: N/A;  . TEMPORARY PACEMAKER N/A 12/04/2017   Procedure: TEMPORARY PACEMAKER;  Surgeon: Leonie Man, MD;  Location: [place] CV LAB;  Service: Cardiovascular;  Laterality: N/A;  . TRANSCATHETER AORTIC VALVE REPLACEMENT, TRANSFEMORAL N/A 12/02/2017   Procedure: TRANSCATHETER AORTIC VALVE REPLACEMENT, TRANSFEMORAL;  Surgeon: Burnell Blanks, MD;  Location: [place] [place];  Service: Open Heart Surgery;  Laterality: N/A;  using Edwards Sapien 3 Transcatheter Heart  Valve size 109mm    There were no vitals filed for this visit.  Subjective Assessment - 04/20/19 0706    Subjective  He brought 3 canes for PT to check. No falls.    Pertinent History  L TTA, CAD, PAF,  pacemaker, DM2, neuropathy, CKD, HTN,     Limitations  Lifting;Standing;Walking;House hold activities    Patient Stated Goals  To use prosthesis in community, travel (uses [employer]i), take of himself so wife does not have to do it. Housework.     Currently in Pain?  No/denies                       [facility] Adult PT Treatment/Exercise - 04/20/19 0705      Transfers   Transfers  Sit to Stand;Stand to Sit    Sit to Stand  5: Supervision;With upper extremity assist;With armrests;From chair/3-in-1   stabilizes with cane   Stand to Sit  5: Supervision;With upper extremity assist;With armrests;To chair/3-in-1   cane for stability     Ambulation/Gait   Ambulation/Gait  Yes  Ambulation/Gait Assistance  4: Min assist;6: Modified independent (Device/Increase time);3: Mod assist   cane MinA straight path, ModA scan & turn, Mod ind rollator    Ambulation Distance (Feet)  125 Feet   125' X 2 and 50' X 2 cane quad tip   Assistive device  Prosthesis;Straight cane;Rollator   straight cane quad tip   Gait Pattern  Step-through pattern;Decreased arm swing - left;Decreased step length - right;Decreased stance time - left;Decreased weight shift to left;Antalgic;Lateral hip instability;Abducted - left;Trunk flexed    Ambulation Surface  Indoor;Level    Stairs  Yes    Stairs Assistance  4: Min guard    Stairs Assistance Details (indicate cue type and reason)  PT demo & verbal cues on alternate pattern with TTA prosthesis    Stair Management Technique  Two rails;Alternating pattern;Forwards    Number of Stairs  4   2 reps   Height of Stairs  6      High Level Balance   High Level Balance Activities  Negotitating around obstacles;Negotiating over obstacles   with cane quad tip & prosthesis    High Level Balance Comments  PT demo & verbal cues prior & manual/tactile, verbal cues during on technique with TTA prosthesis      Prosthetics   Current prosthetic wear tolerance (days/week)   daily     Current prosthetic wear tolerance (#hours/day)   most of awake hours    Current prosthetic weight-bearing tolerance (hours/day)   Pt tolerated standing & gait for 5 min periods without pain or discomfort but muscle fatigue     Edema  non-pitting    Residual limb condition   --    Education Provided  --               PT Short Term Goals - 04/20/19 0754      PT SHORT TERM GOAL #1   Title  Patient verbalizes proper sock management & skin management with no wounds on limb. (All updated STGs Target Date 05/13/2019)    Time  1    Period  Months    Status  On-going    Target Date  05/13/19      PT SHORT TERM GOAL #2   Title  Patient verbalizes & demonstrates understanding of updated HEP.     Time  1    Status  On-going    Target Date  05/13/19      PT SHORT TERM GOAL #3   Title  Patient negotiates ramps & curbs with straight cane quad tip & prosthesis with minA    Status  On-going    Target Date  05/13/19      PT SHORT TERM GOAL #4   Title  Patient ambulates 150' with straight cane quad tip with minA.     Baseline        Time  1    Period  Months    Status  On-going    Target Date  05/13/19        PT Long Term Goals - 04/20/19 0755      PT LONG TERM GOAL #1   Title  Patient verbalizes & demonstrates proper prosthetic care including donning / doffing & wear daily for >90% of awake hours without skin or limb pain issues independently to enable safe use of prosthesis (All LTGs Target Date: 07/09/2019)    Time  3    Period  Months    Status  On-going  PT LONG TERM GOAL #2   Title  Patient demonstrates & verbalizes understanding of ongoing HEP / fitness plan.     Time  3    Period  Months    Status  On-going      PT LONG TERM GOAL #3   Title  Berg Balance >/=  47/56 to indicate lower fall risk & less dependency in standing ADLs.     Time  3    Period  Months    Status  On-going      PT LONG TERM GOAL #4   Title  Patient ambulates 250' with straight cane quad tip & prosthesis modified independent.     Time  3    Period  Months    Status  On-going      PT LONG TERM GOAL #5   Title  Patient negotiates ramps, curbs with straight cane quad tip & stairs 1 rail /cane modified independent for community access.     Time  3    Period  Months    Status  On-going      PT LONG TERM GOAL #6   Title  Dynamic Gait Index with straight cane quad tip & prosthesis >12/24.     Time  3    Period  Months    Status  On-going      PT LONG TERM GOAL #7   Title  Gait Velocity with straight cane quad tip & prosthesis >1.8 ft/sec    Time  3    Period  Months    Status  On-going            Plan - 04/20/19 5573    Clinical Impression Statement  PT adjusted height of canes & rollator walker. Patient has 3 designs of canes (hook, T-handle & off set handle). He seems better with T-handle or off set handle.  He improved balance with prosthetic gait activities with cane with instruction.     Rehab Potential  Good    PT Frequency  1x / week    PT Duration  12 weeks    PT Treatment/Interventions  ADLs/Self Care Home Management;Canalith Repostioning;DME Instruction;Gait training;Stair training;Functional mobility training;Therapeutic activities;Therapeutic exercise;Balance training;Neuromuscular re-education;Patient/family education;Prosthetic Training;Manual techniques;Vestibular    PT Next Visit Plan  balance & gait with cane quad tip including ramps & curbs, OTAGO HEP    Consulted and Agree with Plan of Care  Patient       Patient will benefit from skilled therapeutic intervention in order to improve the following deficits and impairments:  Abnormal gait, Decreased activity tolerance, Decreased balance, Decreased endurance, Decreased knowledge of use of DME,  Decreased mobility, Impaired flexibility, Decreased range of motion, Decreased strength, Decreased skin integrity, Dizziness, Postural dysfunction, Prosthetic Dependency  Visit Diagnosis: Other abnormalities of gait and mobility  Muscle weakness (generalized)  Unsteadiness on feet  Abnormal posture  Contracture of muscle, multiple sites  History of falling     Problem List Patient Active Problem List   Diagnosis Date Noted  . Controlled type 2 diabetes mellitus without complication ([place]) 22/12/5425  . Bradycardia 09/01/2018  . Gangrene of left foot ([place]) 05/28/2018  . 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 Obstructive sleep apnea syndrome 05/04/2018  . Obstructive sleep apnea treated with continuous positive airway pressure (CPAP) 05/04/2018  . Type II diabetes mellitus, uncontrolled ([place]) 04/22/2018  . Atherosclerosis of native arteries of the extremities with gangrene ([place]) 04/09/2018  . Atherosclerosis  of native artery of extremity ([place]) 04/09/2018  . Cellulitis in diabetic foot ([place]) 04/04/2018  . Paroxysmal atrial fibrillation ([place]) 12/05/2017  . Cardiac pacemaker in situ   . Symptomatic bradycardia   . Asystole ([place])   . 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 History of aortic valve replacement 12/02/2017  . Severe aortic stenosis   . Bilateral impacted cerumen 01/22/2016  . Essential hypertension 11/14/2015  . Peripheral vascular disease ([place]) 05/24/2014  . Chronic kidney disease 08/30/2013  . Hypothyroidism 08/30/2013  . 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 Diabetes mellitus type 2, uncomplicated ([place])   . Aortic valve stenosis 07/25/2011  . Hyperlipidemia 01/10/2011  . CAD (coronary artery disease) 01/10/2011  . Coronary arteriosclerosis 01/10/2011    Jamey Reas PT, DPT 04/20/2019, 8:10 AM  [facility] [address] [place], [name], [address] Phone: [phone]   Fax:  [phone]  Name: Steven Maldonado MRN: [id number] Date of Birth: 16-Nov-1940

## 2019-04-26 DIAGNOSIS — H2512 Age-related nuclear cataract, left eye: Secondary | ICD-10-CM | POA: Diagnosis not present

## 2019-04-26 DIAGNOSIS — H25012 Cortical age-related cataract, left eye: Secondary | ICD-10-CM | POA: Diagnosis not present

## 2019-04-26 LAB — HM DIABETES EYE EXAM

## 2019-04-28 ENCOUNTER — Other Ambulatory Visit: Payer: Self-pay

## 2019-04-28 ENCOUNTER — Ambulatory Visit: Payer: Medicare Other | Admitting: Physical Therapy

## 2019-04-28 ENCOUNTER — Encounter: Payer: Self-pay | Admitting: Physical Therapy

## 2019-04-28 ENCOUNTER — Telehealth: Payer: Self-pay | Admitting: Endocrinology

## 2019-04-28 DIAGNOSIS — R293 Abnormal posture: Secondary | ICD-10-CM | POA: Diagnosis not present

## 2019-04-28 DIAGNOSIS — R2689 Other abnormalities of gait and mobility: Secondary | ICD-10-CM

## 2019-04-28 DIAGNOSIS — Z9181 History of falling: Secondary | ICD-10-CM | POA: Diagnosis not present

## 2019-04-28 DIAGNOSIS — M6281 Muscle weakness (generalized): Secondary | ICD-10-CM | POA: Diagnosis not present

## 2019-04-28 DIAGNOSIS — R2681 Unsteadiness on feet: Secondary | ICD-10-CM | POA: Diagnosis not present

## 2019-04-28 DIAGNOSIS — M6249 Contracture of muscle, multiple sites: Secondary | ICD-10-CM | POA: Diagnosis not present

## 2019-04-28 NOTE — Therapy (Signed)
[facility] [address] [place], [name], [address] Phone: [phone]   Fax:  [phone]  Physical Therapy Treatment  Patient Details  Name: Steven Maldonado MRN: [id number] Date of Birth: 04-18-1941 Referring Provider (PT): Mechele Claude, [place]   Encounter Date: 04/28/2019   CLINIC OPERATION CHANGES: Outpatient [facility] is open at lower capacity following universal masking, social distancing, and patient screening.  The patient's COVID risk of complications score is 7.   PT End of Session - 04/28/19 1603    Visit Number  42    Number of Visits  53    Date for PT Re-Evaluation  07/09/19    Authorization Type  Medicare & Federal BCBS    Authorization Time Period  Medicare guidelines, Fed BCBS waves co-pay.  75 visit limit PT, OT & ST combined with 1 used.    Authorization - Visit Number  22    Authorization - Number of Visits  75    PT Start Time  1500    PT Stop Time  1546    PT Time Calculation (min)  46 min    Equipment Utilized During Treatment  Gait belt    Activity Tolerance  Patient tolerated treatment well;No increased pain;Patient limited by fatigue    Behavior During Therapy  [facility] for tasks assessed/performed       Past Medical History:  Diagnosis Date  . Chronic diastolic CHF (congestive heart failure) ([place])   . CKD (chronic kidney disease), stage III ([place])   . Constipation   . Coronary artery disease    a. Cath February 2012 in [facility], occluded RCA with collaterals  . DM type 2 (diabetes mellitus, type 2) ([place])   . Essential hypertension   . Hyperlipidemia   . Neuropathy    feet  . Pacemaker    a. symptomatic brady after TAVR s/p MDT PPM by Dr. Curt Bears 12/04/17  . Persistent atrial fibrillation   . PONV (postoperative nausea and vomiting)    after valve surgery  . PVD (peripheral vascular disease) ([place])    a. s/p R popliteal artery stenosis tx with drug-coated balloon 05/2014, followed by Dr.  Fletcher Anon.  . 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 Severe aortic stenosis    a. 12/02/17: s/p TAVR  . Skin cancer   . Sleep apnea with use of continuous positive airway pressure (CPAP)    04-11-11 AHI was 32.9 and titrated to 15 cm H20, DME is [facility]  . Subclinical hypothyroidism     Past Surgical History:  Procedure Laterality Date  . ABDOMINAL ANGIOGRAM N/A 06/08/2014   Procedure: ABDOMINAL ANGIOGRAM;  Surgeon: Wellington Hampshire, MD;  Location: [facility] CATH LAB;  Service: Cardiovascular;  Laterality: N/A;  . ABDOMINAL AORTOGRAM N/A 04/09/2018   Procedure: ABDOMINAL AORTOGRAM;  Surgeon: Conrad Newberg, MD;  Location: [facility] CV LAB;  Service: Cardiovascular;  Laterality: N/A;  . AMPUTATION Left 04/17/2018   Procedure: LEFT FOOT 3RD RAY AMPUTATION;  Surgeon: Newt Minion, MD;  Location: [facility];  Service: Orthopedics;  Laterality: Left;  . AMPUTATION Left 05/28/2018   Procedure: LEFT AMPUTATION BELOW KNEE;  Surgeon: Wylene Simmer, MD;  Location: [facility];  Service: Orthopedics;  Laterality: Left;  . APPENDECTOMY  1965  . 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 CARDIAC CATHETERIZATION  12/2010  . CARDIOVERSION  07/2011  .  CARDIOVERSION N/A 04/18/2014   Procedure: CARDIOVERSION;  Surgeon: Dorothy Spark, MD;  Location: [facility];  Service: Cardiovascular;  Laterality: N/A;  . CARDIOVERSION N/A 11/03/2015   Procedure: CARDIOVERSION;  Surgeon: Lelon Perla, MD;  Location: [facility] ENDOSCOPY;  Service: Cardiovascular;  Laterality: N/A;  . CARDIOVERSION N/A 05/08/2017   Procedure: CARDIOVERSION;  Surgeon: Dorothy Spark, MD;  Location: [facility] ENDOSCOPY;  Service: Cardiovascular;  Laterality: N/A;  . CARDIOVERSION N/A 07/28/2017   Procedure: CARDIOVERSION;  Surgeon: Dorothy Spark, MD;  Location: [facility];  Service: Cardiovascular;  Laterality: N/A;  . LOWER EXTREMITY  ANGIOGRAM N/A 06/08/2014   Procedure: LOWER EXTREMITY ANGIOGRAM;  Surgeon: Wellington Hampshire, MD;  Location: [facility] CATH LAB;  Service: Cardiovascular;  Laterality: N/A;  . LOWER EXTREMITY ANGIOGRAPHY Left 04/09/2018   Procedure: Lower Extremity Angiography;  Surgeon: Conrad Cohasset, MD;  Location: [facility] CV LAB;  Service: Cardiovascular;  Laterality: Left;  . PACEMAKER IMPLANT N/A 12/04/2017   Procedure: PACEMAKER IMPLANT;  Surgeon: Constance Haw, MD;  Location: [facility] CV LAB;  Service: Cardiovascular;  Laterality: N/A;  . PERIPHERAL VASCULAR BALLOON ANGIOPLASTY Left 04/09/2018   Procedure: PERIPHERAL VASCULAR BALLOON ANGIOPLASTY;  Surgeon: Conrad , MD;  Location: [facility] CV LAB;  Service: Cardiovascular;  Laterality: Left;  SFA  . POPLITEAL ARTERY ANGIOPLASTY Right 06/08/2014   Archie Endo 06/08/2014  . RIGHT/LEFT HEART CATH AND CORONARY ANGIOGRAPHY N/A 10/08/2017   Procedure: RIGHT/LEFT HEART CATH AND CORONARY ANGIOGRAPHY;  Surgeon: Burnell Blanks, MD;  Location: [facility] CV LAB;  Service: Cardiovascular;  Laterality: N/A;  . SKIN CANCER EXCISION Bilateral    "have had them cut off back of neck X 2; off left upper arm; right wrist, near right shoulder blade" (06/08/2014)  . TEE WITHOUT CARDIOVERSION N/A 12/02/2017   Procedure: TRANSESOPHAGEAL ECHOCARDIOGRAM (TEE);  Surgeon: Burnell Blanks, MD;  Location: [facility];  Service: Open Heart Surgery;  Laterality: N/A;  . TEMPORARY PACEMAKER N/A 12/04/2017   Procedure: TEMPORARY PACEMAKER;  Surgeon: Leonie Man, MD;  Location: [facility] CV LAB;  Service: Cardiovascular;  Laterality: N/A;  . TRANSCATHETER AORTIC VALVE REPLACEMENT, TRANSFEMORAL N/A 12/02/2017   Procedure: TRANSCATHETER AORTIC VALVE REPLACEMENT, TRANSFEMORAL;  Surgeon: Burnell Blanks, MD;  Location: [facility];  Service: Open Heart Surgery;  Laterality: N/A;  using Edwards Sapien 3 Transcatheter Heart Valve size 75mm    There were no vitals filed for  this visit.  Subjective Assessment - 04/28/19 1459    Subjective  He is wearing prosthesis. No falls.    Pertinent History  L TTA, CAD, PAF,  pacemaker, DM2, neuropathy, CKD, HTN,     Limitations  Lifting;Standing;Walking;House hold activities    Patient Stated Goals  To use prosthesis in community, travel (uses Lucianne Lei), take of himself so wife does not have to do it. Housework.     Currently in Pain?  No/denies                       [facility] Adult PT Treatment/Exercise - 04/28/19 1500      Transfers   Transfers  Sit to Stand;Stand to Sit    Sit to Stand  5: Supervision;With upper extremity assist;With armrests;From chair/3-in-1;4: Min guard   stabilizes with cane, min guard no armrests   Sit to Stand Details  Visual cues for safe use of DME/AE;Verbal cues for technique;Verbal cues for safe use of DME/AE    Sit to Stand Details (indicate cue type and  reason)  worked on Public relations account executive using UE    Stand to Sit  5: Supervision;With upper extremity assist;With armrests;To chair/3-in-1   cane for stability, chairs without armrests   Stand to Sit Details (indicate cue type and reason)  Visual cues for safe use of DME/AE;Verbal cues for technique;Verbal cues for safe use of DME/AE    Stand to Sit Details  worked on chairs without armrests using UE      Ambulation/Gait   Ambulation/Gait  Yes    Ambulation/Gait Assistance  4: Min guard;5: Supervision;6: Modified independent (Device/Increase time)   Mod Ind rollator in/out building, cane during session   Ambulation/Gait Assistance Details  verbal & tactile cues on wt shift over prosthesis and step thru with RLE,  2nd rep worked on carrying cup of water around furniture & 3rd time plate with items around furniture    Ambulation Distance (Feet)  125 Feet   125' & 100' X 2   Assistive device  Prosthesis;Straight cane;Rollator   straight cane quad tip   Gait Pattern  Step-through pattern;Decreased arm swing - left;Decreased step  length - right;Decreased stance time - left;Decreased weight shift to left;Antalgic;Lateral hip instability;Abducted - left;Trunk flexed    Ambulation Surface  Indoor;Level    Stairs  Yes    Stairs Assistance  5: Supervision    Stairs Assistance Details (indicate cue type and reason)  verbal cues on sequence & technique with single rail & cane    Stair Management Technique  One rail Right;One rail Left;With cane;Step to pattern;Forwards    Number of Stairs  4   2 reps   Height of Stairs  6    Ramp  4: Min assist   cane quad tip & prosthesis, 2 reps   Ramp Details (indicate cue type and reason)  verbal & tactile cues on technique, upright posture & wt shift    Curb  3: Mod assist   cane quad tip & prosthesis,  2 reps   Curb Details (indicate cue type and reason)  worked on chairs without armrests using UE    Pre-Gait Activities  verbal & tactile cues on technique, upright posture, step thru & wt shift      High Level Balance   High Level Balance Activities  Negotitating around obstacles;Negotiating over obstacles   with cane quad tip & prosthesis   High Level Balance Comments  negotiating tight areas & around furniture carrying cup of water or plate of items      Prosthetics   Prosthetic Care Comments   use of baby oil at knee area where red to reduce friction rub of liner    Current prosthetic wear tolerance (days/week)   daily     Current prosthetic wear tolerance (#hours/day)   most of awake hours    Current prosthetic weight-bearing tolerance (hours/day)   Pt tolerated standing & gait for 5 min 5 reps periods without pain or discomfort but muscle fatigue     Edema  non-pitting    Residual limb condition   deep pink over patella & medial knee, no open areas, shiny skin with no hair growth, normal temperature    Education Provided  Residual limb care;Skin check    Person(s) Educated  Patient    Education Method  Explanation;Demonstration;Verbal cues    Education Method  Verbalized  understanding;Needs further instruction    Donning Prosthesis  Modified independent (device/increased time)    Doffing Prosthesis  Modified independent (device/increased time)  PT Education - 04/28/19 1606    Education Details  continue walking program with rollator walker walking 5-7 min away, sit to rest, then return to home.  If sits too long that he is stiff, stretch so upright prior to walking away from chair    Person(s) Educated  Patient    Methods  Explanation;Verbal cues    Comprehension  Verbalized understanding       PT Short Term Goals - 04/20/19 0754      PT SHORT TERM GOAL #1   Title  Patient verbalizes proper sock management & skin management with no wounds on limb. (All updated STGs Target Date 05/13/2019)    Time  1    Period  Months    Status  On-going    Target Date  05/13/19      PT SHORT TERM GOAL #2   Title  Patient verbalizes & demonstrates understanding of updated HEP.     Time  1    Status  On-going    Target Date  05/13/19      PT SHORT TERM GOAL #3   Title  Patient negotiates ramps & curbs with straight cane quad tip & prosthesis with minA    Status  On-going    Target Date  05/13/19      PT SHORT TERM GOAL #4   Title  Patient ambulates 150' with straight cane quad tip with minA.     Baseline        Time  1    Period  Months    Status  On-going    Target Date  05/13/19        PT Long Term Goals - 04/20/19 0755      PT LONG TERM GOAL #1   Title  Patient verbalizes & demonstrates proper prosthetic care including donning / doffing & wear daily for >90% of awake hours without skin or limb pain issues independently to enable safe use of prosthesis (All LTGs Target Date: 07/09/2019)    Time  3    Period  Months    Status  On-going      PT LONG TERM GOAL #2   Title  Patient demonstrates & verbalizes understanding of ongoing HEP / fitness plan.     Time  3    Period  Months    Status  On-going      PT LONG TERM GOAL #3    Title  Berg Balance >/= 47/56 to indicate lower fall risk & less dependency in standing ADLs.     Time  3    Period  Months    Status  On-going      PT LONG TERM GOAL #4   Title  Patient ambulates 250' with straight cane quad tip & prosthesis modified independent.     Time  3    Period  Months    Status  On-going      PT LONG TERM GOAL #5   Title  Patient negotiates ramps, curbs with straight cane quad tip & stairs 1 rail /cane modified independent for community access.     Time  3    Period  Months    Status  On-going      PT LONG TERM GOAL #6   Title  Dynamic Gait Index with straight cane quad tip & prosthesis >12/24.     Time  3    Period  Months    Status  On-going  PT LONG TERM GOAL #7   Title  Gait Velocity with straight cane quad tip & prosthesis >1.8 ft/sec    Time  3    Period  Months    Status  On-going            Plan - 04/28/19 1604    Clinical Impression Statement  Today's session focused on prosthetic gait with cane quad tip negotiating furniture & carrying cup or plate. PT also worked on negotiating stairs with single rail, ramps &curbs with cane quad tip. He needs more practice before using cane outside of PT.    Rehab Potential  Good    PT Frequency  1x / week    PT Duration  12 weeks    PT Treatment/Interventions  ADLs/Self Care Home Management;Canalith Repostioning;DME Instruction;Gait training;Stair training;Functional mobility training;Therapeutic activities;Therapeutic exercise;Balance training;Neuromuscular re-education;Patient/family education;Prosthetic Training;Manual techniques;Vestibular    PT Next Visit Plan  balance & gait with cane quad tip including ramps & curbs,    Consulted and Agree with Plan of Care  Patient       Patient will benefit from skilled therapeutic intervention in order to improve the following deficits and impairments:  Abnormal gait, Decreased activity tolerance, Decreased balance, Decreased endurance, Decreased  knowledge of use of DME, Decreased mobility, Impaired flexibility, Decreased range of motion, Decreased strength, Decreased skin integrity, Dizziness, Postural dysfunction, Prosthetic Dependency  Visit Diagnosis: 1. Other abnormalities of gait and mobility   2. Muscle weakness (generalized)   3. Unsteadiness on feet   4. Abnormal posture        Problem List Patient Active Problem List   Diagnosis Date Noted  . Controlled type 2 diabetes mellitus without complication ([place]) 67/54/4920  . Bradycardia 09/01/2018  . Gangrene of left foot ([place]) 05/28/2018  . 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 Obstructive sleep apnea syndrome 05/04/2018  . Obstructive sleep apnea treated with continuous positive airway pressure (CPAP) 05/04/2018  . Type II diabetes mellitus, uncontrolled ([place]) 04/22/2018  . Atherosclerosis of native arteries of the extremities with gangrene ([place]) 04/09/2018  . Atherosclerosis of native artery of extremity ([place]) 04/09/2018  . Cellulitis in diabetic foot ([place]) 04/04/2018  . Paroxysmal atrial fibrillation ([place]) 12/05/2017  . Cardiac pacemaker in situ   . Symptomatic bradycardia   . Asystole ([place])   . 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 History of aortic valve replacement 12/02/2017  . Severe aortic stenosis   . Bilateral impacted cerumen 01/22/2016  . Essential hypertension 11/14/2015  . Peripheral vascular disease ([place]) 05/24/2014  . Chronic kidney disease 08/30/2013  . Hypothyroidism 08/30/2013  . 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 Diabetes mellitus type 2, uncomplicated ([place])   . Aortic valve stenosis 07/25/2011  . Hyperlipidemia 01/10/2011  . CAD (coronary artery disease) 01/10/2011  . Coronary arteriosclerosis 01/10/2011    Jamey Reas PT, DPT 04/28/2019, 4:08 PM  [facility] [address] [place], [name], [address] Phone: [phone]   Fax:  [phone]  Name: Steven Maldonado MRN: [id number] Date of Birth: [birth year]

## 2019-04-28 NOTE — Telephone Encounter (Signed)
LMTCB and schedule lab and 3 mo f/u appointment per Dr Dwyane Dee

## 2019-05-03 ENCOUNTER — Encounter: Payer: Self-pay | Admitting: Family Medicine

## 2019-05-03 ENCOUNTER — Other Ambulatory Visit: Payer: Self-pay

## 2019-05-03 ENCOUNTER — Telehealth: Payer: Self-pay | Admitting: Endocrinology

## 2019-05-03 ENCOUNTER — Ambulatory Visit (INDEPENDENT_AMBULATORY_CARE_PROVIDER_SITE_OTHER): Payer: Medicare Other | Admitting: Family Medicine

## 2019-05-03 VITALS — BP 144/78 | HR 78 | Temp 98.3°F | Resp 18 | Ht 71.0 in | Wt 238.0 lb

## 2019-05-03 DIAGNOSIS — I739 Peripheral vascular disease, unspecified: Secondary | ICD-10-CM

## 2019-05-03 DIAGNOSIS — S80811A Abrasion, right lower leg, initial encounter: Secondary | ICD-10-CM | POA: Diagnosis not present

## 2019-05-03 DIAGNOSIS — L089 Local infection of the skin and subcutaneous tissue, unspecified: Secondary | ICD-10-CM | POA: Diagnosis not present

## 2019-05-03 DIAGNOSIS — I779 Disorder of arteries and arterioles, unspecified: Secondary | ICD-10-CM | POA: Diagnosis not present

## 2019-05-03 DIAGNOSIS — L03031 Cellulitis of right toe: Secondary | ICD-10-CM

## 2019-05-03 MED ORDER — SULFAMETHOXAZOLE-TRIMETHOPRIM 800-160 MG PO TABS: 1.0000 | ORAL_TABLET | Freq: Two times a day (BID) | ORAL | 0 refills | Status: DC

## 2019-05-03 MED ORDER — MUPIROCIN 2 % EX OINT: 1.0000 "application " | TOPICAL_OINTMENT | Freq: Two times a day (BID) | CUTANEOUS | 0 refills | Status: DC

## 2019-05-03 NOTE — Telephone Encounter (Signed)
I would prefer that he be seen by Dr. Buelah Manis with his PCP.   Otherwise he can come here today.  He is overdue for his diabetes visit

## 2019-05-03 NOTE — Patient Instructions (Signed)
F/U Friday for recheck on Foot

## 2019-05-03 NOTE — Telephone Encounter (Signed)
I have seen him already. Started on antibiotics, has recheck this week

## 2019-05-03 NOTE — Assessment & Plan Note (Signed)
Followed by vascular Will need close watch on the toe

## 2019-05-03 NOTE — Telephone Encounter (Signed)
Please confirm the patient has received message, will also forward to PCP

## 2019-05-03 NOTE — Telephone Encounter (Signed)
Pt called and voicemail left with MD message.

## 2019-05-03 NOTE — Telephone Encounter (Signed)
Patients wife called stating that the patient has a blister on his toe and will need an antibiotic. Patient does not have a PCP.  Please Advise, Thanks

## 2019-05-03 NOTE — Progress Notes (Signed)
Subjective:    Patient ID: Steven Maldonado, male    DOB: Aug 13, 1941, 78 y.o.   MRN: [id number]  Patient presents for Toe Issues (x5 days- blistered and then drained) and Ulceration (back of R leg)  Here with lesion on his right toe the back of his right leg. He is status post left BKA in 2019.  His diabetes is followed by Dr. Dwyane Dee is also followed by vascular as well as cardiology   Dr. Doran Durand was surgeon for BKA  TDAP done 2015   3 days ago he had a blood blister that popped.  He does have history of these he actually had a infection on his left toe after having a blood blister that resulted in amputation then he had gangrene resulted in his AKA.  He has been using topical Neosporin.  He has mild tenderness but does not noted any drainage.  He is not had any fever.  He does see some redness around but states it is not changed since he came up 3 days ago.  He does recall wearing 2 pairs socks and had some tighter shoes on which he think may have caused a blister.  He also has an abrasion on the front of his leg where his walker hit him in the back of his leg on the calf area where he was trying to get on a lawnmower he has not had any pain or drainage but does notice a little redness around the scab.   Review Of Systems:  GEN- denies fatigue, fever, weight loss,weakness, recent illness HEENT- denies eye drainage, change in vision, nasal discharge, CVS- denies chest pain, palpitations RESP- denies SOB, cough, wheeze ABD- denies N/V, change in stools, abd pain GU- denies dysuria, hematuria, dribbling, incontinence MSK- denies joint pain, muscle aches, injury Neuro- denies headache, dizziness, syncope, seizure activity       Objective:    BP (!) 144/78   Pulse 78   Temp 98.3 F (36.8 C) (Oral)   Resp 18   Ht 5\' 11"  (1.803 m)   Wt 238 lb (108 kg)   SpO2 98%   BMI 33.19 kg/m  GEN- NAD, alert and oriented x3 CVS- RRR, systolic  Murmur, pacer  RESP-CTAB EXT- No edema  Skin-  right calf- small abrasion with yellow scab at center surrounding by 1/2 inch of erythema , non fluctanct    Right great toe- nickel size superficial abrasion with erythema- meaty apperance, NT,  erythema of great toe extends to base of 2nd digit, no warmth, NT,   small abrasion of shin MSK- FAIR ROM ankle Pulses- , DP- not palpated        Assessment & Plan:      Problem List Items Addressed This Visit      Unprioritized   PVD (peripheral vascular disease) ([place])    Followed by vascular Will need close watch on the toe        Other Visit Diagnoses    Cellulitis of toe of right foot    -  Primary   Concern for cellulitis as result of the blister, has significant PVD and infection history. dressing change at bedside, start bactroban and bactrim orally, recheck Friday Diabetes has been well controlled Discussed red flags  This all appears superficial at this time, not to bone   Infected calf abrasion, right, initial encounter       Relevant Medications   mupirocin ointment (BACTROBAN) 2 %   sulfamethoxazole-trimethoprim (BACTRIM DS) 800-160 MG  tablet      Note: This dictation was prepared with Dragon dictation along with smaller phrase technology. Any transcriptional errors that result from this process are unintentional.

## 2019-05-04 DIAGNOSIS — H2512 Age-related nuclear cataract, left eye: Secondary | ICD-10-CM | POA: Diagnosis not present

## 2019-05-04 DIAGNOSIS — H25812 Combined forms of age-related cataract, left eye: Secondary | ICD-10-CM | POA: Diagnosis not present

## 2019-05-04 DIAGNOSIS — H25012 Cortical age-related cataract, left eye: Secondary | ICD-10-CM | POA: Diagnosis not present

## 2019-05-05 ENCOUNTER — Encounter: Payer: Medicare Other | Admitting: Physical Therapy

## 2019-05-07 ENCOUNTER — Other Ambulatory Visit: Payer: Self-pay

## 2019-05-07 ENCOUNTER — Encounter: Payer: Self-pay | Admitting: Family Medicine

## 2019-05-07 ENCOUNTER — Ambulatory Visit (INDEPENDENT_AMBULATORY_CARE_PROVIDER_SITE_OTHER): Payer: Medicare Other | Admitting: Family Medicine

## 2019-05-07 VITALS — BP 142/80 | HR 62 | Temp 98.1°F | Resp 12 | Ht 71.0 in | Wt 238.0 lb

## 2019-05-07 DIAGNOSIS — I739 Peripheral vascular disease, unspecified: Secondary | ICD-10-CM

## 2019-05-07 DIAGNOSIS — I779 Disorder of arteries and arterioles, unspecified: Secondary | ICD-10-CM | POA: Diagnosis not present

## 2019-05-07 DIAGNOSIS — L03031 Cellulitis of right toe: Secondary | ICD-10-CM | POA: Diagnosis not present

## 2019-05-07 MED ORDER — SULFAMETHOXAZOLE-TRIMETHOPRIM 800-160 MG PO TABS: 1.0000 | ORAL_TABLET | Freq: Two times a day (BID) | ORAL | 0 refills | Status: DC

## 2019-05-07 NOTE — Assessment & Plan Note (Signed)
Complete bactrim, will extend to total of 10 days, wound looks better, but will take a couple weeks to heal with his PVD, recheck next week

## 2019-05-07 NOTE — Progress Notes (Signed)
   Subjective:    Patient ID: Steven Maldonado, male    DOB: Jan 20, 1941, 78 y.o.   MRN: [id number]  Patient presents for Follow-up (foot injury)  Here for interim follow-up on cellulitis of his right toe.  It does appear smaller today.  He is still had some yellow drainage but has had decrease in pain.  He has been trying to keep elevated.  He has underlying peripheral vascular disease as well as diabetes mellitus.  Has history of amputation of the left leg only on Bactrim and using Bactroban ointment.  He has been changing his dressing twice a day   Review Of Systems:  GEN- denies fatigue, fever, weight loss,weakness, recent illness HEENT- denies eye drainage, change in vision, nasal discharge, CVS- denies chest pain, palpitations RESP- denies SOB, cough, wheeze ABD- denies N/V, change in stools, abd pain GU- denies dysuria, hematuria, dribbling, incontinence MSK- + joint pain, muscle aches, injury Neuro- denies headache, dizziness, syncope, seizure activity       Objective:    BP (!) 142/80   Pulse 62   Temp 98.1 F (36.7 C) (Oral)   Resp 12   Ht 5\' 11"  (1.803 m)   Wt 238 lb (108 kg)   SpO2 98%   BMI 33.19 kg/m  GEN- NAD, alert and oriented x3 EXT- trace edema  Skin-   Right great toe- nickel size superficial abrasion with erythema- yellow exudate with sloughing skin removed, good granulation tissue beneath,NT,  Decreased erythema of great toe but still  extends to base of 2nd digit, no warmth, NT,  MSK- FAIR ROM ankle Pulses- , DP- not palpated       Assessment & Plan:      Problem List Items Addressed This Visit      Unprioritized   Cellulitis of right toe    Complete bactrim, will extend to total of 10 days, wound looks better, but will take a couple weeks to heal with his PVD, recheck next week      PVD (peripheral vascular disease) ([place]) - Primary      Note: This dictation was prepared with Dragon dictation along with smaller phrase technology. Any  transcriptional errors that result from this process are unintentional.

## 2019-05-07 NOTE — Patient Instructions (Addendum)
F/U Next Wed at 12pm  Antibiotics extended

## 2019-05-11 ENCOUNTER — Encounter: Payer: Medicare Other | Admitting: Physical Therapy

## 2019-05-11 ENCOUNTER — Ambulatory Visit: Payer: Medicare Other | Admitting: Physical Therapy

## 2019-05-11 ENCOUNTER — Other Ambulatory Visit: Payer: Self-pay

## 2019-05-11 ENCOUNTER — Encounter: Payer: Self-pay | Admitting: Physical Therapy

## 2019-05-11 DIAGNOSIS — M6281 Muscle weakness (generalized): Secondary | ICD-10-CM

## 2019-05-11 DIAGNOSIS — R293 Abnormal posture: Secondary | ICD-10-CM | POA: Diagnosis not present

## 2019-05-11 DIAGNOSIS — R2681 Unsteadiness on feet: Secondary | ICD-10-CM | POA: Diagnosis not present

## 2019-05-11 DIAGNOSIS — R2689 Other abnormalities of gait and mobility: Secondary | ICD-10-CM

## 2019-05-11 DIAGNOSIS — Z9181 History of falling: Secondary | ICD-10-CM | POA: Diagnosis not present

## 2019-05-11 DIAGNOSIS — M6249 Contracture of muscle, multiple sites: Secondary | ICD-10-CM | POA: Diagnosis not present

## 2019-05-11 NOTE — Therapy (Signed)
[facility] [address] [place], [name], [address] Phone: [phone]   Fax:  [phone]  Physical Therapy Treatment  Patient Details  Name: Steven Maldonado MRN: [id number] Date of Birth: 1941/04/01 Referring Provider (PT): Mechele Claude, [place]   Encounter Date: 05/11/2019   CLINIC OPERATION CHANGES: [facility] is open at lower capacity following universal masking, social distancing, and patient screening.   PT End of Session - 05/11/19 1602    Visit Number  43    Number of Visits  53    Date for PT Re-Evaluation  07/09/19    Authorization Type  Medicare & Federal BCBS    Authorization Time Period  Medicare guidelines, Fed BCBS waves co-pay.  75 visit limit PT, OT & ST combined with 1 used.    Authorization - Visit Number  23    Authorization - Number of Visits  75    PT Start Time  1600    PT Stop Time  0350    PT Time Calculation (min)  46 min    Equipment Utilized During Treatment  Gait belt    Activity Tolerance  Patient tolerated treatment well;No increased pain;Patient limited by fatigue    Behavior During Therapy  [facility] for tasks assessed/performed       Past Medical History:  Diagnosis Date  . Chronic diastolic CHF (congestive heart failure) ([place])   . CKD (chronic kidney disease), stage III ([place])   . Constipation   . Coronary artery disease    a. Cath February 2012 in [place], occluded RCA with collaterals  . DM type 2 (diabetes mellitus, type 2) ([place])   . Essential hypertension   . Hyperlipidemia   . Neuropathy    feet  . Pacemaker    a. symptomatic brady after TAVR s/p MDT PPM by Dr. Curt Bears 12/04/17  . Persistent atrial fibrillation   . PONV (postoperative nausea and vomiting)    after valve surgery  . PVD (peripheral vascular disease) ([place])    a. s/p R popliteal artery stenosis tx with drug-coated balloon 05/2014, followed by Dr. Fletcher Anon.  . 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 Severe aortic stenosis    a. 12/02/17: s/p TAVR  . Skin cancer   . Sleep apnea with use of continuous positive airway pressure (CPAP)    04-11-11 AHI was 32.9 and titrated to 15 cm H20, DME is [facility]  . Subclinical hypothyroidism     Past Surgical History:  Procedure Laterality Date  . ABDOMINAL ANGIOGRAM N/A 06/08/2014   Procedure: ABDOMINAL ANGIOGRAM;  Surgeon: Wellington Hampshire, MD;  Location: [facility] CATH LAB;  Service: Cardiovascular;  Laterality: N/A;  . ABDOMINAL AORTOGRAM N/A 04/09/2018   Procedure: ABDOMINAL AORTOGRAM;  Surgeon: Conrad Momence, MD;  Location: [place] CV LAB;  Service: Cardiovascular;  Laterality: N/A;  . AMPUTATION Left 04/17/2018   Procedure: LEFT FOOT 3RD RAY AMPUTATION;  Surgeon: Newt Minion, MD;  Location: [place];  Service: Orthopedics;  Laterality: Left;  . AMPUTATION Left 05/28/2018   Procedure: LEFT AMPUTATION BELOW KNEE;  Surgeon: Wylene Simmer, MD;  Location: [place];  Service: Orthopedics;  Laterality: Left;  . APPENDECTOMY  1965  . 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 CARDIAC CATHETERIZATION  12/2010  . CARDIOVERSION  07/2011  . CARDIOVERSION N/A 04/18/2014   Procedure: CARDIOVERSION;  Surgeon: Houston Siren  Hazel Sams, MD;  Location: [place];  Service: Cardiovascular;  Laterality: N/A;  . CARDIOVERSION N/A 11/03/2015   Procedure: CARDIOVERSION;  Surgeon: Lelon Perla, MD;  Location: [facility] ENDOSCOPY;  Service: Cardiovascular;  Laterality: N/A;  . CARDIOVERSION N/A 05/08/2017   Procedure: CARDIOVERSION;  Surgeon: Dorothy Spark, MD;  Location: [facility] ENDOSCOPY;  Service: Cardiovascular;  Laterality: N/A;  . CARDIOVERSION N/A 07/28/2017   Procedure: CARDIOVERSION;  Surgeon: Dorothy Spark, MD;  Location: [place];  Service: Cardiovascular;  Laterality: N/A;  . LOWER EXTREMITY ANGIOGRAM N/A 06/08/2014   Procedure: LOWER  EXTREMITY ANGIOGRAM;  Surgeon: Wellington Hampshire, MD;  Location: [facility] CATH LAB;  Service: Cardiovascular;  Laterality: N/A;  . LOWER EXTREMITY ANGIOGRAPHY Left 04/09/2018   Procedure: Lower Extremity Angiography;  Surgeon: Conrad Parnell, MD;  Location: [place] CV LAB;  Service: Cardiovascular;  Laterality: Left;  . PACEMAKER IMPLANT N/A 12/04/2017   Procedure: PACEMAKER IMPLANT;  Surgeon: Constance Haw, MD;  Location: [place] CV LAB;  Service: Cardiovascular;  Laterality: N/A;  . PERIPHERAL VASCULAR BALLOON ANGIOPLASTY Left 04/09/2018   Procedure: PERIPHERAL VASCULAR BALLOON ANGIOPLASTY;  Surgeon: Conrad Spurgeon, MD;  Location: [place] CV LAB;  Service: Cardiovascular;  Laterality: Left;  SFA  . POPLITEAL ARTERY ANGIOPLASTY Right 06/08/2014   Archie Endo 06/08/2014  . RIGHT/LEFT HEART CATH AND CORONARY ANGIOGRAPHY N/A 10/08/2017   Procedure: RIGHT/LEFT HEART CATH AND CORONARY ANGIOGRAPHY;  Surgeon: Burnell Blanks, MD;  Location: [place] CV LAB;  Service: Cardiovascular;  Laterality: N/A;  . SKIN CANCER EXCISION Bilateral    "have had them cut off back of neck X 2; off left upper arm; right wrist, near right shoulder blade" (06/08/2014)  . TEE WITHOUT CARDIOVERSION N/A 12/02/2017   Procedure: TRANSESOPHAGEAL ECHOCARDIOGRAM (TEE);  Surgeon: Burnell Blanks, MD;  Location: [place];  Service: Open Heart Surgery;  Laterality: N/A;  . TEMPORARY PACEMAKER N/A 12/04/2017   Procedure: TEMPORARY PACEMAKER;  Surgeon: Leonie Man, MD;  Location: [place] CV LAB;  Service: Cardiovascular;  Laterality: N/A;  . TRANSCATHETER AORTIC VALVE REPLACEMENT, TRANSFEMORAL N/A 12/02/2017   Procedure: TRANSCATHETER AORTIC VALVE REPLACEMENT, TRANSFEMORAL;  Surgeon: Burnell Blanks, MD;  Location: [place];  Service: Open Heart Surgery;  Laterality: N/A;  using Edwards Sapien 3 Transcatheter Heart Valve size 51m    There were no vitals filed for this visit.  Subjective Assessment -  05/11/19 1602    Subjective  No new complaints. Reports toe is healing up, see's the MD tomorrow for follow up. No falls.    Pertinent History  L TTA, CAD, PAF,  pacemaker, DM2, neuropathy, CKD, HTN,     Limitations  Lifting;Standing;Walking;House hold activities    Patient Stated Goals  To use prosthesis in community, travel (uses vLucianne Lei, take of himself so wife does not have to do it. Housework.     Currently in Pain?  No/denies    Pain Score  0-No pain             [facility] Adult PT Treatment/Exercise - 05/11/19 1605      Transfers   Transfers  Sit to Stand;Stand to Sit    Sit to Stand  5: Supervision;With upper extremity assist;With armrests;From chair/3-in-1;4: Min guard    Stand to Sit  5: Supervision;With upper extremity assist;With armrests;To chair/3-in-1      Ambulation/Gait   Ambulation/Gait  Yes    Ambulation/Gait Assistance  6: Modified independent (Device/Increase time)    Ambulation Distance (Feet)  100  Feet   x2   Assistive device  Rollator;Prosthesis    Gait Pattern  Step-through pattern;Decreased arm swing - left;Decreased step length - right;Decreased stance time - left;Decreased weight shift to left;Antalgic;Lateral hip instability;Abducted - left;Trunk flexed    Ambulation Surface  Level;Indoor      Prosthetics   Current prosthetic wear tolerance (days/week)   daily     Current prosthetic wear tolerance (#hours/day)   most of awake hours    Donning Prosthesis  Modified independent (device/increased time)    Doffing Prosthesis  Modified independent (device/increased time)          Balance Exercises - 05/11/19 1606      OTAGO PROGRAM   Head Movements  Sitting;5 reps    Neck Movements  Sitting;5 reps    Back Extension  Standing;5 reps   with UE support   Trunk Movements  Standing;5 reps   with UE support   Ankle Movements  Sitting;10 reps   right only   Knee Extensor  10 reps;Weight (comment)    Knee Flexor  10 reps;Weight (comment)   2# ankle  weights   Hip ABductor  10 reps;Weight (comment)   2# ankle weights   Ankle Plantorflexors  20 reps, support    Ankle Dorsiflexors  20 reps, support   stopped at 15    Overall OTAGO Comments  used rollator/counter for balance support. cues needed on form and technique           PT Short Term Goals - 05/11/19 1603      PT SHORT TERM GOAL #1   Title  Patient verbalizes proper sock management & skin management with no wounds on limb. (All updated STGs Target Date 05/13/2019)    Baseline  05/11/19: met today    Status  Achieved    Target Date  05/13/19      PT SHORT TERM GOAL #2   Title  Patient verbalizes & demonstrates understanding of updated HEP.     Baseline  05/11/19: initated an HEP today (OTAGO)    Time  1    Status  Not Met    Target Date  05/13/19      PT SHORT TERM GOAL #3   Title  Patient negotiates ramps & curbs with straight cane quad tip & prosthesis with minA    Status  On-going    Target Date  05/13/19      PT SHORT TERM GOAL #4   Title  Patient ambulates 150' with straight cane quad tip with minA.     Baseline        Time  1    Period  Months    Status  On-going    Target Date  05/13/19        PT Long Term Goals - 04/20/19 0755      PT LONG TERM GOAL #1   Title  Patient verbalizes & demonstrates proper prosthetic care including donning / doffing & wear daily for >90% of awake hours without skin or limb pain issues independently to enable safe use of prosthesis (All LTGs Target Date: 07/09/2019)    Time  3    Period  Months    Status  On-going      PT LONG TERM GOAL #2   Title  Patient demonstrates & verbalizes understanding of ongoing HEP / fitness plan.     Time  3    Period  Months    Status  On-going  PT LONG TERM GOAL #3   Title  Berg Balance >/= 47/56 to indicate lower fall risk & less dependency in standing ADLs.     Time  3    Period  Months    Status  On-going      PT LONG TERM GOAL #4   Title  Patient ambulates 250' with straight  cane quad tip & prosthesis modified independent.     Time  3    Period  Months    Status  On-going      PT LONG TERM GOAL #5   Title  Patient negotiates ramps, curbs with straight cane quad tip & stairs 1 rail /cane modified independent for community access.     Time  3    Period  Months    Status  On-going      PT LONG TERM GOAL #6   Title  Dynamic Gait Index with straight cane quad tip & prosthesis >12/24.     Time  3    Period  Months    Status  On-going      PT LONG TERM GOAL #7   Title  Gait Velocity with straight cane quad tip & prosthesis >1.8 ft/sec    Time  3    Period  Months    Status  On-going            Plan - 05/11/19 1603    Clinical Impression Statement  Today's skilled session began to address progress toward STGs with 1st goal met. Upon reaching the 2cd goal the pt reported he had not recieved an HEP from [place] to date other than the sink program when he first started. With some chart review noted the plan to issue OTAGO in the plan after pt returned to PT after Covid closure. Remainder of today's session focused on educating pt on and issuing OTAGO for pt to do at home. Will plan to issue remainder of program at next session.    Rehab Potential  Good    PT Frequency  1x / week    PT Duration  12 weeks    PT Treatment/Interventions  ADLs/Self Care Home Management;Canalith Repostioning;DME Instruction;Gait training;Stair training;Functional mobility training;Therapeutic activities;Therapeutic exercise;Balance training;Neuromuscular re-education;Patient/family education;Prosthetic Training;Manual techniques;Vestibular    PT Next Visit Plan  issue remainder of OTAGO (ensure no questions on parts already issued) and check remaining goals.    Consulted and Agree with Plan of Care  Patient       Patient will benefit from skilled therapeutic intervention in order to improve the following deficits and impairments:  Abnormal gait, Decreased activity tolerance, Decreased  balance, Decreased endurance, Decreased knowledge of use of DME, Decreased mobility, Impaired flexibility, Decreased range of motion, Decreased strength, Decreased skin integrity, Dizziness, Postural dysfunction, Prosthetic Dependency  Visit Diagnosis: 1. Other abnormalities of gait and mobility   2. Muscle weakness (generalized)   3. Unsteadiness on feet        Problem List Patient Active Problem List   Diagnosis Date Noted  . Controlled type 2 diabetes mellitus without complication ([place]) 16/11/930  . Bradycardia 09/01/2018  . Gangrene of left foot ([place]) 05/28/2018  . 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 Obstructive sleep apnea syndrome 05/04/2018  . Obstructive sleep apnea treated with continuous positive airway pressure (CPAP) 05/04/2018  . Type II diabetes mellitus, uncontrolled ([place]) 04/22/2018  . Atherosclerosis of native arteries of the extremities with gangrene ([place]) 04/09/2018  . Atherosclerosis  of native artery of extremity ([place]) 04/09/2018  . Cellulitis of right toe 04/04/2018  . Paroxysmal atrial fibrillation ([place]) 12/05/2017  . Cardiac pacemaker in situ   . Symptomatic bradycardia   . Asystole ([place])   . 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 History of aortic valve replacement 12/02/2017  . Severe aortic stenosis   . Bilateral impacted cerumen 01/22/2016  . Essential hypertension 11/14/2015  . PVD (peripheral vascular disease) ([place]) 05/24/2014  . Chronic kidney disease 08/30/2013  . Hypothyroidism 08/30/2013  . 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 Diabetes mellitus type 2, uncomplicated ([place])   . Aortic valve stenosis 07/25/2011  . Hyperlipidemia 01/10/2011  . CAD (coronary artery disease) 01/10/2011  . Coronary arteriosclerosis 01/10/2011    Willow Ora, PTA, [facility] [address], [place], [place] [address] [phone] 05/11/19,  5:10 PM   Name: Steven Maldonado MRN: [id number] Date of Birth: [birth year]

## 2019-05-12 ENCOUNTER — Ambulatory Visit: Payer: Medicare Other | Admitting: Family Medicine

## 2019-05-12 ENCOUNTER — Ambulatory Visit (INDEPENDENT_AMBULATORY_CARE_PROVIDER_SITE_OTHER): Payer: Medicare Other | Admitting: Family Medicine

## 2019-05-12 ENCOUNTER — Other Ambulatory Visit: Payer: Self-pay | Admitting: *Deleted

## 2019-05-12 ENCOUNTER — Encounter: Payer: Self-pay | Admitting: Family Medicine

## 2019-05-12 VITALS — BP 138/78 | HR 78 | Temp 98.1°F | Resp 16 | Ht 71.0 in | Wt 238.0 lb

## 2019-05-12 DIAGNOSIS — I739 Peripheral vascular disease, unspecified: Secondary | ICD-10-CM

## 2019-05-12 DIAGNOSIS — L97511 Non-pressure chronic ulcer of other part of right foot limited to breakdown of skin: Secondary | ICD-10-CM

## 2019-05-12 DIAGNOSIS — I779 Disorder of arteries and arterioles, unspecified: Secondary | ICD-10-CM

## 2019-05-12 NOTE — Patient Instructions (Addendum)
COntinue bandage changes Complete antibiotics Use topical ointment F/U Tuesday or thursday  Dr. Dennard Schaumann

## 2019-05-12 NOTE — Assessment & Plan Note (Signed)
Per previous notes I think that this will be a slow healing ulceration.  It is still superficial.  Is actually getting smaller has some good granulation tissue.  There is no warmth there is no drainage no odor.  I think the cellulitic component has been treated with the Bactrim I would not extend the antibiotics.  He will continue with topical antibiotic ointment.  He does not have any pain.  He is ambulating at his baseline.  We will have him come back in to see my partner next week for another recheck.  He will continue dressing changes at home.  If for some reason it does decompensate would get him with wound care

## 2019-05-12 NOTE — Progress Notes (Signed)
   Subjective:    Patient ID: Steven Maldonado, male    DOB: Jul 24, 1941, 78 y.o.   MRN: [id number]  Patient presents for Follow-up (toe ulcer)  Here for interim follow-up on his toe ulcer cellulitis of the right toe.  He does not have any specific concerns today it does appear smaller.  He has not had any significant drainage.  He has another dose of antibiotics Bactrim left.  He has been using the Bactroban ointment   Review Of Systems:  GEN- denies fatigue, fever, weight loss,weakness, recent illness MSK- denies joint pain, muscle aches, injury       Objective:    BP 138/78   Pulse 78   Temp 98.1 F (36.7 C) (Oral)   Resp 16   Ht 5\' 11"  (1.803 m)   Wt 238 lb (108 kg)   SpO2 98%   BMI 33.19 kg/m  GEN- NAD, alert and oriented x3 EXT- trace edema  Skin-   Right great toe- nickel size superficial abrasion with  good granulation tissue beneath ulcer size smaller, ,NT,  Decreased erythema of great toe , no warmth, has ruddy hue to all toes (chronic) MSK- FAIR ROM ankle Pulses- , DP- not palpated          Assessment & Plan:      Problem List Items Addressed This Visit      Unprioritized   PVD (peripheral vascular disease) ([place]) - Primary    Per previous notes I think that this will be a slow healing ulceration.  It is still superficial.  Is actually getting smaller has some good granulation tissue.  There is no warmth there is no drainage no odor.  I think the cellulitic component has been treated with the Bactrim I would not extend the antibiotics.  He will continue with topical antibiotic ointment.  He does not have any pain.  He is ambulating at his baseline.  We will have him come back in to see my partner next week for another recheck.  He will continue dressing changes at home.  If for some reason it does decompensate would get him with wound care       Other Visit Diagnoses    Skin ulcer of right great toe, limited to breakdown of skin [facility])          Note: This  dictation was prepared with Dragon dictation along with smaller phrase technology. Any transcriptional errors that result from this process are unintentional.

## 2019-05-15 ENCOUNTER — Other Ambulatory Visit: Payer: Self-pay | Admitting: Family Medicine

## 2019-05-19 ENCOUNTER — Other Ambulatory Visit: Payer: Self-pay

## 2019-05-19 ENCOUNTER — Ambulatory Visit: Payer: Medicare Other | Attending: Student | Admitting: Physical Therapy

## 2019-05-19 ENCOUNTER — Encounter: Payer: Self-pay | Admitting: Physical Therapy

## 2019-05-19 ENCOUNTER — Encounter: Payer: Self-pay | Admitting: *Deleted

## 2019-05-19 DIAGNOSIS — R2689 Other abnormalities of gait and mobility: Secondary | ICD-10-CM | POA: Insufficient documentation

## 2019-05-19 DIAGNOSIS — M6249 Contracture of muscle, multiple sites: Secondary | ICD-10-CM | POA: Insufficient documentation

## 2019-05-19 DIAGNOSIS — Z9181 History of falling: Secondary | ICD-10-CM | POA: Insufficient documentation

## 2019-05-19 DIAGNOSIS — M6281 Muscle weakness (generalized): Secondary | ICD-10-CM | POA: Diagnosis not present

## 2019-05-19 DIAGNOSIS — R293 Abnormal posture: Secondary | ICD-10-CM | POA: Diagnosis not present

## 2019-05-19 DIAGNOSIS — R2681 Unsteadiness on feet: Secondary | ICD-10-CM | POA: Insufficient documentation

## 2019-05-20 ENCOUNTER — Ambulatory Visit (INDEPENDENT_AMBULATORY_CARE_PROVIDER_SITE_OTHER): Payer: Medicare Other | Admitting: Family Medicine

## 2019-05-20 ENCOUNTER — Encounter: Payer: Self-pay | Admitting: Family Medicine

## 2019-05-20 VITALS — BP 132/74 | HR 78 | Temp 97.8°F | Resp 16 | Ht 71.0 in | Wt 229.0 lb

## 2019-05-20 DIAGNOSIS — L03031 Cellulitis of right toe: Secondary | ICD-10-CM

## 2019-05-20 DIAGNOSIS — I779 Disorder of arteries and arterioles, unspecified: Secondary | ICD-10-CM | POA: Diagnosis not present

## 2019-05-20 DIAGNOSIS — L97511 Non-pressure chronic ulcer of other part of right foot limited to breakdown of skin: Secondary | ICD-10-CM | POA: Diagnosis not present

## 2019-05-20 DIAGNOSIS — I739 Peripheral vascular disease, unspecified: Secondary | ICD-10-CM

## 2019-05-20 MED ORDER — CLINDAMYCIN HCL 300 MG PO CAPS: 300.0000 mg | ORAL_CAPSULE | Freq: Three times a day (TID) | ORAL | 0 refills | Status: DC

## 2019-05-20 NOTE — Therapy (Signed)
[facility] [address] [place], [name], [address] Phone: [phone]   Fax:  [phone]  Physical Therapy Treatment  Patient Details  Name: Steven Maldonado MRN: [id number] Date of Birth: 08-09-1941 Referring Provider (PT): Mechele Claude, [place]   Encounter Date: 05/19/2019   CLINIC OPERATION CHANGES: [facility] is open at lower capacity following universal masking, social distancing, and patient screening.  The patient's COVID risk of complications score is 7.   PT End of Session - 05/19/19 1654    Visit Number  44    Number of Visits  53    Date for PT Re-Evaluation  07/09/19    Authorization Type  Medicare & Federal BCBS    Authorization Time Period  Medicare guidelines, Fed BCBS waves co-pay.  75 visit limit PT, OT & ST combined with 1 used.    Authorization - Visit Number  24    Authorization - Number of Visits  75    PT Start Time  1600    PT Stop Time  6503    PT Time Calculation (min)  48 min    Equipment Utilized During Treatment  Gait belt    Activity Tolerance  Patient tolerated treatment well;No increased pain;Patient limited by fatigue    Behavior During Therapy  [facility] for tasks assessed/performed       Past Medical History:  Diagnosis Date  . Chronic diastolic CHF (congestive heart failure) ([place])   . CKD (chronic kidney disease), stage III ([place])   . Constipation   . Coronary artery disease    a. Cath February 2012 in [facility], occluded RCA with collaterals  . DM type 2 (diabetes mellitus, type 2) ([place])   . Essential hypertension   . Hyperlipidemia   . Neuropathy    feet  . Pacemaker    a. symptomatic brady after TAVR s/p MDT PPM by Dr. Curt Bears 12/04/17  . Persistent atrial fibrillation   . PONV (postoperative nausea and vomiting)    after valve surgery  . PVD (peripheral vascular disease) ([place])    a. s/p R popliteal artery stenosis tx with drug-coated balloon 05/2014, followed by Dr.  Fletcher Anon.  . 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 Severe aortic stenosis    a. 12/02/17: s/p TAVR  . Skin cancer   . Sleep apnea with use of continuous positive airway pressure (CPAP)    04-11-11 AHI was 32.9 and titrated to 15 cm H20, DME is [facility]  . Subclinical hypothyroidism     Past Surgical History:  Procedure Laterality Date  . ABDOMINAL ANGIOGRAM N/A 06/08/2014   Procedure: ABDOMINAL ANGIOGRAM;  Surgeon: Wellington Hampshire, MD;  Location: [facility] CATH LAB;  Service: Cardiovascular;  Laterality: N/A;  . ABDOMINAL AORTOGRAM N/A 04/09/2018   Procedure: ABDOMINAL AORTOGRAM;  Surgeon: Conrad Whitewater, MD;  Location: [facility] CV LAB;  Service: Cardiovascular;  Laterality: N/A;  . AMPUTATION Left 04/17/2018   Procedure: LEFT FOOT 3RD RAY AMPUTATION;  Surgeon: Newt Minion, MD;  Location: [facility];  Service: Orthopedics;  Laterality: Left;  . AMPUTATION Left 05/28/2018   Procedure: LEFT AMPUTATION BELOW KNEE;  Surgeon: Wylene Simmer, MD;  Location: [facility];  Service: Orthopedics;  Laterality: Left;  . APPENDECTOMY  1965  . 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 CARDIAC CATHETERIZATION  12/2010  . CARDIOVERSION  07/2011  .  CARDIOVERSION N/A 04/18/2014   Procedure: CARDIOVERSION;  Surgeon: Dorothy Spark, MD;  Location: [facility];  Service: Cardiovascular;  Laterality: N/A;  . CARDIOVERSION N/A 11/03/2015   Procedure: CARDIOVERSION;  Surgeon: Lelon Perla, MD;  Location[facility] ENDOSCOPY;  Service: Cardiovascular;  Laterality: N/A;  . CARDIOVERSION N/A 05/08/2017   Procedure: CARDIOVERSION;  Surgeon: Dorothy Spark, MD;  Location: [facility] ENDOSCOPY;  Service: Cardiovascular;  Laterality: N/A;  . CARDIOVERSION N/A 07/28/2017   Procedure: CARDIOVERSION;  Surgeon: Dorothy Spark, MD;  Location: [place];  Service: Cardiovascular;  Laterality: N/A;  . LOWER EXTREMITY  ANGIOGRAM N/A 06/08/2014   Procedure: LOWER EXTREMITY ANGIOGRAM;  Surgeon: Wellington Hampshire, MD;  Location: [facility] CATH LAB;  Service: Cardiovascular;  Laterality: N/A;  . LOWER EXTREMITY ANGIOGRAPHY Left 04/09/2018   Procedure: Lower Extremity Angiography;  Surgeon: Conrad Seba Dalkai, MD;  Location: [facility] CV LAB;  Service: Cardiovascular;  Laterality: Left;  . PACEMAKER IMPLANT N/A 12/04/2017   Procedure: PACEMAKER IMPLANT;  Surgeon: Constance Haw, MD;  Location: [facility] CV LAB;  Service: Cardiovascular;  Laterality: N/A;  . PERIPHERAL VASCULAR BALLOON ANGIOPLASTY Left 04/09/2018   Procedure: PERIPHERAL VASCULAR BALLOON ANGIOPLASTY;  Surgeon: Conrad Waverly, MD;  Location: [facility] CV LAB;  Service: Cardiovascular;  Laterality: Left;  SFA  . POPLITEAL ARTERY ANGIOPLASTY Right 06/08/2014   Archie Endo 06/08/2014  . RIGHT/LEFT HEART CATH AND CORONARY ANGIOGRAPHY N/A 10/08/2017   Procedure: RIGHT/LEFT HEART CATH AND CORONARY ANGIOGRAPHY;  Surgeon: Burnell Blanks, MD;  Location: [place] CV LAB;  Service: Cardiovascular;  Laterality: N/A;  . SKIN CANCER EXCISION Bilateral    "have had them cut off back of neck X 2; off left upper arm; right wrist, near right shoulder blade" (06/08/2014)  . TEE WITHOUT CARDIOVERSION N/A 12/02/2017   Procedure: TRANSESOPHAGEAL ECHOCARDIOGRAM (TEE);  Surgeon: Burnell Blanks, MD;  Location: [place] [facility];  Service: Open Heart Surgery;  Laterality: N/A;  . TEMPORARY PACEMAKER N/A 12/04/2017   Procedure: TEMPORARY PACEMAKER;  Surgeon: Leonie Man, MD;  Location: [facility] CV LAB;  Service: Cardiovascular;  Laterality: N/A;  . TRANSCATHETER AORTIC VALVE REPLACEMENT, TRANSFEMORAL N/A 12/02/2017   Procedure: TRANSCATHETER AORTIC VALVE REPLACEMENT, TRANSFEMORAL;  Surgeon: Burnell Blanks, MD;  Location: [facility];  Service: Open Heart Surgery;  Laterality: N/A;  using Edwards Sapien 3 Transcatheter Heart Valve size 60m    There were no vitals filed for  this visit.  Subjective Assessment - 05/19/19 1603    Subjective  No falls. He has not been using cane at home yet. He got weights that range from 1#-5#for HEP    Pertinent History  L TTA, CAD, PAF,  pacemaker, DM2, neuropathy, CKD, HTN,     Limitations  Lifting;Standing;Walking;House hold activities    Patient Stated Goals  To use prosthesis in community, travel (uses vLucianne Lei, take of himself so wife does not have to do it. Housework.     Currently in Pain?  No/denies                       [facility] - 05/19[facility]19 1600      Transfers   Transfers  Sit to Stand;Stand to Sit    Sit to Stand  5: Supervision;With upper extremity assist;With armrests;From chair/3-in-1   to cane   Stand to Sit  5: Supervision;With upper extremity assist;With armrests;To chair/3-in-1   from cane     Ambulation/Gait   Ambulation/Gait  Yes  Ambulation/Gait Assistance  5: Supervision    Ambulation/Gait Assistance Details  tactile & verbal cues on upright posture, step width & wt shift    Ambulation Distance (Feet)  100 Feet   x2   Assistive device  Prosthesis;Straight cane;Rollator   enter/exit with rollator, session with cane quad tip   Gait Pattern  Step-through pattern;Decreased arm swing - left;Decreased step length - right;Decreased stance time - left;Decreased weight shift to left;Antalgic;Lateral hip instability;Abducted - left;Trunk flexed    Ambulation Surface  Indoor;Level      Prosthetics   Current prosthetic wear tolerance (days/week)   daily     Current prosthetic wear tolerance (#hours/day)   most of awake hours          Balance Exercises - 05/19/19 1600      OTAGO PROGRAM   Head Movements  Sitting;5 reps    Neck Movements  Sitting;5 reps    Back Extension  Standing;5 reps    Trunk Movements  Standing;5 reps    Ankle Movements  Sitting;10 reps   RLE   Knee Extensor  Weight (comment);10 reps   5#   Knee Flexor  Weight (comment);10 reps   5# BUE  support   Hip ABductor  Weight (comment);10 reps   5# BUE support   Ankle Plantorflexors  20 reps, support    Ankle Dorsiflexors  20 reps, support    Knee Bends  10 reps, support    Backwards Walking  Support   cane/counter support   Sideways Walking  Assistive device   counter & cane support   Tandem Stance  10 seconds, support   intermittent touch on counter   Tandem Walk  Support   cane & counter support   One Leg Stand  10 seconds, support   modified with 1 foot in lower cabinet intermittent touch   Overall OTAGO Comments  pt forgot OTAGO book but will bring nexgt session          PT Short Term Goals - 05/19/19 2247      PT SHORT TERM GOAL #1   Title  Patient verbalizes proper sock management & skin management with no wounds on limb. (All updated STGs Target Date 05/13/2019)    Baseline  05/11/19: met today    Status  Achieved    Target Date  05/13/19      PT SHORT TERM GOAL #2   Title  Patient verbalizes & demonstrates understanding of updated HEP.     Baseline  05/11/19: initated an HEP today (OTAGO)    Time  1    Status  On-going    Target Date  05/13/19      PT SHORT TERM GOAL #3   Title  Patient negotiates ramps & curbs with straight cane quad tip & prosthesis with minA    Baseline  MET 05/19/2019    Status  Achieved    Target Date  05/13/19      PT SHORT TERM GOAL #4   Title  Patient ambulates 150' with straight cane quad tip with minA.     Baseline  MET 05/19/2019    Time  1    Period  Months    Status  Achieved    Target Date  05/13/19       PT Short Term Goals - 05/19/19 2252      PT SHORT TERM GOAL #1   Title  Patient verbalizes proper adjustment of ply sock fit with volume fluctuations. (All updated STGs  Target Date 06/11/2019)    Time  4    Period  Weeks    Status  Revised    Target Date  06/11/19      PT SHORT TERM GOAL #2   Title  Patient verbalizes & demonstrates understanding of OTAGO HEP.    Time  4    Period  Weeks    Status  Revised     Target Date  06/11/19      PT SHORT TERM GOAL #3   Title  Patient negotiates ramps & curbs with straight cane quad tip & prosthesis with supervision    Time  4    Period  Weeks    Status  Revised    Target Date  06/11/19      PT SHORT TERM GOAL #4   Title  Patient ambulates 150' with straight cane quad tip with supervision.    Time  4    Period  Weeks    Status  Revised    Target Date  06/11/19         PT Long Term Goals - 04/20/19 0755      PT LONG TERM GOAL #1   Title  Patient verbalizes & demonstrates proper prosthetic care including donning / doffing & wear daily for >90% of awake hours without skin or limb pain issues independently to enable safe use of prosthesis (All LTGs Target Date: 07/09/2019)    Time  3    Period  Months    Status  On-going      PT LONG TERM GOAL #2   Title  Patient demonstrates & verbalizes understanding of ongoing HEP / fitness plan.     Time  3    Period  Months    Status  On-going      PT LONG TERM GOAL #3   Title  Berg Balance >/= 47/56 to indicate lower fall risk & less dependency in standing ADLs.     Time  3    Period  Months    Status  On-going      PT LONG TERM GOAL #4   Title  Patient ambulates 250' with straight cane quad tip & prosthesis modified independent.     Time  3    Period  Months    Status  On-going      PT LONG TERM GOAL #5   Title  Patient negotiates ramps, curbs with straight cane quad tip & stairs 1 rail /cane modified independent for community access.     Time  3    Period  Months    Status  On-going      PT LONG TERM GOAL #6   Title  Dynamic Gait Index with straight cane quad tip & prosthesis >12/24.     Time  3    Period  Months    Status  On-going      PT LONG TERM GOAL #7   Title  Gait Velocity with straight cane quad tip & prosthesis >1.8 ft/sec    Time  3    Period  Months    Status  On-going            Plan - 05/19/19 2248    Clinical Impression Statement  Patient met STGs set for  intial 30 days. He has general understanding of OTAGO HEP but needs further instruction & notes in his booklet.    Rehab Potential  Good    PT Frequency  1x /  week    PT Duration  12 weeks    PT Treatment/Interventions  ADLs/Self Care Home Management;Canalith Repostioning;DME Instruction;Gait training;Stair training;Functional mobility training;Therapeutic activities;Therapeutic exercise;Balance training;Neuromuscular re-education;Patient/family education;Prosthetic Training;Manual techniques;Vestibular    PT Next Visit Plan  review OTAGO HEP & make notes in his book. work towards updated [employer] and Agree with Plan of Care  Patient       Patient will benefit from skilled therapeutic intervention in order to improve the following deficits and impairments:  Abnormal gait, Decreased activity tolerance, Decreased balance, Decreased endurance, Decreased knowledge of use of DME, Decreased mobility, Impaired flexibility, Decreased range of motion, Decreased strength, Decreased skin integrity, Dizziness, Postural dysfunction, Prosthetic Dependency  Visit Diagnosis: 1. Muscle weakness (generalized)   2. Other abnormalities of gait and mobility   3. Unsteadiness on feet   4. Abnormal posture        Problem List Patient Active Problem List   Diagnosis Date Noted  . Controlled type 2 diabetes mellitus without complication ([place]) 46/56/8127  . Bradycardia 09/01/2018  . Gangrene of left foot ([place]) 05/28/2018  . 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 Obstructive sleep apnea syndrome 05/04/2018  . Obstructive sleep apnea treated with continuous positive airway pressure (CPAP) 05/04/2018  . Type II diabetes mellitus, uncontrolled ([place]) 04/22/2018  . Atherosclerosis of native arteries of the extremities with gangrene ([place]) 04/09/2018  . Atherosclerosis of native artery of extremity ([place]) 04/09/2018  . Cellulitis of right toe 04/04/2018  . Paroxysmal  atrial fibrillation ([place]) 12/05/2017  . Cardiac pacemaker in situ   . Symptomatic bradycardia   . Asystole ([place])   . 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 History of aortic valve replacement 12/02/2017  . Severe aortic stenosis   . Bilateral impacted cerumen 01/22/2016  . Essential hypertension 11/14/2015  . PVD (peripheral vascular disease) ([place]) 05/24/2014  . Chronic kidney disease 08/30/2013  . Hypothyroidism 08/30/2013  . 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 Diabetes mellitus type 2, uncomplicated ([place])   . Aortic valve stenosis 07/25/2011  . Hyperlipidemia 01/10/2011  . CAD (coronary artery disease) 01/10/2011  . Coronary arteriosclerosis 01/10/2011    Jamey Reas PT, DPT 05/20/2019, 10:50 PM  [facility] [address] [place], [name], [address] Phone: [phone]   Fax:  [phone]  Name: Steven Maldonado MRN: [id number] Date of Birth: 1941/07/05

## 2019-05-20 NOTE — Progress Notes (Signed)
Subjective:    Patient ID: Steven Maldonado, male    DOB: 03-03-1941, 78 y.o.   MRN: [id number]  HPI  Patient saw my partner last week and was started on Bactrim for possible cellulitis in the right foot.  Patient states he spontaneously had a blister develop on the dorsum of his left great toe that ruptured.  This was draining and apparently when he saw my partner last week, the erythema was present around the great toe.  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  Patient states that it looks similar to last week.  However I have taken a picture included below  As the pitcher demonstrates, there is erythema from the first MTP joint all the way to the tip of his right great toe.  There is also now erythema extending to the second third fourth and fifth toes.  Per the patient's report this is chronic.  There are shallow ulcerations on the dorsal surfaces of the second and third toe where the patient "stumped his toe against his walker" earlier this week.  There is no purulent foul-smelling drainage coming from any of the ulceration seen below.  Keeping them covered with a Band-Aid  Past Medical History:  Diagnosis Date  . Chronic diastolic CHF (congestive heart failure) ([place])   . CKD (chronic kidney disease), stage III ([place])   . Constipation   . Coronary artery disease    a. Cath February 2012 in [facility], occluded RCA with collaterals  . DM type 2 (diabetes mellitus, type 2) ([place])   . Essential hypertension   . Hyperlipidemia   . Neuropathy    feet  . Pacemaker    a. symptomatic brady after TAVR s/p MDT PPM by Dr. Curt Bears 12/04/17  . Persistent atrial fibrillation   . PONV (postoperative nausea and vomiting)    after valve surgery  . PVD (peripheral vascular disease) ([place])    a. s/p R popliteal artery stenosis tx with drug-coated balloon 05/2014, followed by Dr. Fletcher Anon.  . 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 Severe aortic stenosis    a. 12/02/17: s/p TAVR  . Skin cancer   . Sleep apnea with use of continuous positive airway pressure (CPAP)    04-11-11 AHI was 32.9 and titrated to 15 cm H20, DME is [facility]  . Subclinical hypothyroidism    Past Surgical History:  Procedure Laterality Date  . ABDOMINAL ANGIOGRAM N/A 06/08/2014   Procedure: ABDOMINAL ANGIOGRAM;  Surgeon: Wellington Hampshire, MD;  Location: [facility] CATH LAB;  Service: Cardiovascular;  Laterality: N/A;  . ABDOMINAL AORTOGRAM N/A 04/09/2018   Procedure: ABDOMINAL AORTOGRAM;  Surgeon: Conrad [place], MD;  Location: [place] CV LAB;  Service: Cardiovascular;  Laterality: N/A;  . AMPUTATION Left 04/17/2018   Procedure: LEFT FOOT 3RD RAY AMPUTATION;  Surgeon: Newt Minion, MD;  Location: [place];  Service: Orthopedics;  Laterality: Left;  . AMPUTATION Left 05/28/2018   Procedure: LEFT AMPUTATION BELOW KNEE;  Surgeon: Wylene Simmer, MD;  Location: [place];  Service: Orthopedics;  Laterality: Left;  . APPENDECTOMY  1965  . 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 CARDIAC CATHETERIZATION  12/2010  . CARDIOVERSION  07/2011  . CARDIOVERSION N/A 04/18/2014   Procedure: CARDIOVERSION;  Surgeon: Dorothy Spark, MD;  Location:  [facility] ENDOSCOPY;  Service: Cardiovascular;  Laterality: N/A;  . CARDIOVERSION N/A 11/03/2015   Procedure: CARDIOVERSION;  Surgeon: Lelon Perla, MD;  Location: [facility] ENDOSCOPY;  Service: Cardiovascular;  Laterality: N/A;  . CARDIOVERSION N/A 05/08/2017   Procedure: CARDIOVERSION;  Surgeon: Dorothy Spark, MD;  Location: [facility] ENDOSCOPY;  Service: Cardiovascular;  Laterality: N/A;  . CARDIOVERSION N/A 07/28/2017   Procedure: CARDIOVERSION;  Surgeon: Dorothy Spark, MD;  Location: [place];  Service: Cardiovascular;  Laterality: N/A;  . LOWER EXTREMITY ANGIOGRAM N/A 06/08/2014   Procedure:  LOWER EXTREMITY ANGIOGRAM;  Surgeon: Wellington Hampshire, MD;  Location: [facility] CATH LAB;  Service: Cardiovascular;  Laterality: N/A;  . LOWER EXTREMITY ANGIOGRAPHY Left 04/09/2018   Procedure: Lower Extremity Angiography;  Surgeon: Conrad [place], MD;  Location: [place] CV LAB;  Service: Cardiovascular;  Laterality: Left;  . PACEMAKER IMPLANT N/A 12/04/2017   Procedure: PACEMAKER IMPLANT;  Surgeon: Constance Haw, MD;  Location: [place] CV LAB;  Service: Cardiovascular;  Laterality: N/A;  . PERIPHERAL VASCULAR BALLOON ANGIOPLASTY Left 04/09/2018   Procedure: PERIPHERAL VASCULAR BALLOON ANGIOPLASTY;  Surgeon: Conrad Panguitch, MD;  Location: [place] CV LAB;  Service: Cardiovascular;  Laterality: Left;  SFA  . POPLITEAL ARTERY ANGIOPLASTY Right 06/08/2014   Archie Endo 06/08/2014  . RIGHT/LEFT HEART CATH AND CORONARY ANGIOGRAPHY N/A 10/08/2017   Procedure: RIGHT/LEFT HEART CATH AND CORONARY ANGIOGRAPHY;  Surgeon: Burnell Blanks, MD;  Location: [place] CV LAB;  Service: Cardiovascular;  Laterality: N/A;  . SKIN CANCER EXCISION Bilateral    "have had them cut off back of neck X 2; off left upper arm; right wrist, near right shoulder blade" (06/08/2014)  . TEE WITHOUT CARDIOVERSION N/A 12/02/2017   Procedure: TRANSESOPHAGEAL ECHOCARDIOGRAM (TEE);  Surgeon: Burnell Blanks, MD;  Location: [place];  Service: Open Heart Surgery;  Laterality: N/A;  . TEMPORARY PACEMAKER N/A 12/04/2017   Procedure: TEMPORARY PACEMAKER;  Surgeon: Leonie Man, MD;  Location: [place] CV LAB;  Service: Cardiovascular;  Laterality: N/A;  . TRANSCATHETER AORTIC VALVE REPLACEMENT, TRANSFEMORAL N/A 12/02/2017   Procedure: TRANSCATHETER AORTIC VALVE REPLACEMENT, TRANSFEMORAL;  Surgeon: Burnell Blanks, MD;  Location: [place];  Service: Open Heart Surgery;  Laterality: N/A;  using Edwards Sapien 3 Transcatheter Heart Valve size 40mm   Current Outpatient Medications on File Prior to Visit  Medication Sig  Dispense Refill  . amiodarone (PACERONE) 200 MG tablet TAKE 1 TABLET BY MOUTH EVERY DAY 90 tablet 3  . atorvastatin (LIPITOR) 80 MG tablet Take 1 tablet (80 mg total) by mouth at bedtime. 90 tablet 3  . Blood Glucose Monitoring Suppl (FREESTYLE LITE) DEVI 1 each by Does not apply route 2 (two) times daily. 1 each 0  . Choline Fenofibrate (FENOFIBRIC ACID) 135 MG CPDR TAKE 1 CAPSULE DAILY 90 capsule 3  . dabigatran (PRADAXA) 150 MG CAPS capsule Take 150 mg by mouth every 12 (twelve) hours.     . docusate sodium (COLACE) 100 MG capsule Take 100 mg by mouth 2 (two) times daily.    . furosemide (LASIX) 20 MG tablet Take 1 tablet (20 mg total) by mouth daily. (Patient taking differently: Take 20 mg by mouth every Monday, Wednesday, and Friday. ) 90 tablet 3  . gabapentin (NEURONTIN) 300 MG capsule Take 1 capsule (300 mg total) by mouth 3 (three) times daily. 90 capsule 5  . glipiZIDE (GLUCOTROL XL) 10 MG 24 hr tablet TAKE 1 TABLET (10 MG TOTAL) BY MOUTH DAILY WITH BREAKFAST. [place]  tablet 1  . glucose blood (FREESTYLE LITE) test strip 1 each by Other route 4 (four) times daily -  before meals and at bedtime. 450 each 3  . insulin lispro (HUMALOG KWIKPEN) 100 UNIT/ML KwikPen Inject 5 units in the skin three times daily with meals (Patient taking differently: Inject 5 Units into the skin 2 (two) times daily. Inject 5 units in the skin twice a day with breakfast and lunch.) 15 pen 2  . Insulin Pen Needle (BD PEN NEEDLE NANO U/F) 32G X 4 MM MISC USE 4 (FOUR) TIMES DAILY. 1000 each 0  . levothyroxine (SYNTHROID, LEVOTHROID) 175 MCG tablet TAKE 1 TABLET BY MOUTH EVERY DAY 90 tablet 1  . metoprolol tartrate (LOPRESSOR) 50 MG tablet TAKE ONE AND ONE-HALF TABLET BY MOUTH TWICE A DAY 270 tablet 2  . mirabegron ER (MYRBETRIQ) 25 MG TB24 tablet Take 1 tablet (25 mg total) by mouth daily. 90 tablet 3  . mupirocin ointment (BACTROBAN) 2 % Place 1 application into the nose 2 (two) times daily. 30 g 0  . pentoxifylline  (TRENTAL) 400 MG CR tablet Take 1 tablet (400 mg total) by mouth 3 (three) times daily with meals. 90 tablet 3  . TRESIBA FLEXTOUCH 200 UNIT/ML SOPN INJECT 66 UNITS INTO THE SKIN AT BEDTIME. 15 pen 2   No current facility-administered medications on file prior to visit.    No Known Allergies Social History   Socioeconomic History  . Marital status: Married    Spouse name: [name]  . Number of children: 1  . Years of education: 23  . Highest education level: Not on file  Occupational History  . Occupation: retired    Fish farm manager: [employer]  . Financial resource strain: Not on file  . Food insecurity    Worry: Not on file    Inability: Not on file  . Transportation needs    Medical: Not on file    Non-medical: Not on file  Tobacco Use  . Smoking status: Former Smoker    Packs/day: 2.00    Years: 14.00    Pack years: 28.00    Types: Cigarettes    Quit date: 11/11/1972    Years since quitting: 46.5  . Smokeless tobacco: Never Used  Substance and Sexual Activity  . Alcohol use: No    Comment: quit in 1984  . Drug use: No  . Sexual activity: Not Currently  Lifestyle  . Physical activity    Days per week: 0 days    Minutes per session: 0 min  . Stress: Not at all  Relationships  . Social Herbalist on phone: Not on file    Gets together: Not on file    Attends religious service: Not on file    Active member of club or organization: Not on file    Attends meetings of clubs or organizations: Not on file    Relationship status: Not on file  . Intimate partner violence    Fear of current or ex partner: Not on file    Emotionally abused: Not on file    Physically abused: Not on file    Forced sexual activity: Not on file  Other Topics Concern  . 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   Patient has one child and his wife has one child.   Patient is retired.   Patient has a high  school education.   Patient is right-handed.   Patient drinks very little caffeine.    Review of Systems  All other systems reviewed and are negative.      Objective:   Physical Exam Vitals signs reviewed.  Cardiovascular:     Rate and Rhythm: Normal rate and regular rhythm.  Pulmonary:     Effort: Pulmonary effort is normal.     Breath sounds: Normal breath sounds.  Musculoskeletal:     Right foot: Deformity and laceration present.   I am unable to appreciate any dorsalis pedis or posterior tibialis pulse in the right foot.  I reviewed his vascular ultrasound that he had June 2020.  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        Assessment & Plan:  1. PVD (peripheral vascular disease) ([place])  2. Skin ulcer of right great toe, limited to breakdown of skin (HCC)  3. Cellulitis of right toe  I will switch antibiotics to cover anaerobic bacteria, as well as MRSA with clindamycin 300 mg p.o. 3 times daily.  I have applied Silvadene to the wounds, cover them with nonadherent gauze, and wrapped the foot in coban.  We have discussed daily dressing changes.  Reassess the patient on Monday.  Unfortunately I feel that the situation is worsening.  If the redness is not improving at that point, I would recommend an MRI of the foot to evaluate possible osteomyelitis that may necessitate surgical consultation.

## 2019-05-24 ENCOUNTER — Encounter: Payer: Self-pay | Admitting: Family Medicine

## 2019-05-24 ENCOUNTER — Ambulatory Visit (INDEPENDENT_AMBULATORY_CARE_PROVIDER_SITE_OTHER): Payer: Medicare Other | Admitting: Family Medicine

## 2019-05-24 ENCOUNTER — Other Ambulatory Visit: Payer: Self-pay

## 2019-05-24 VITALS — BP 138/92 | HR 66 | Temp 98.0°F | Resp 18 | Ht 71.0 in | Wt 221.0 lb

## 2019-05-24 DIAGNOSIS — I739 Peripheral vascular disease, unspecified: Secondary | ICD-10-CM | POA: Diagnosis not present

## 2019-05-24 DIAGNOSIS — L03031 Cellulitis of right toe: Secondary | ICD-10-CM

## 2019-05-24 DIAGNOSIS — I779 Disorder of arteries and arterioles, unspecified: Secondary | ICD-10-CM

## 2019-05-24 DIAGNOSIS — L97511 Non-pressure chronic ulcer of other part of right foot limited to breakdown of skin: Secondary | ICD-10-CM | POA: Diagnosis not present

## 2019-05-24 MED ORDER — SILVER SULFADIAZINE 1 % EX CREA: 1.0000 "application " | TOPICAL_CREAM | Freq: Every day | CUTANEOUS | 0 refills | Status: DC

## 2019-05-24 NOTE — Progress Notes (Signed)
Subjective:    Patient ID: Steven Maldonado, male    DOB: 26-Sep-1941, 78 y.o.   MRN: [id number]  HPI 05/20/19 Patient saw my partner last week and was started on Bactrim for possible cellulitis in the right foot.  Patient states he spontaneously had a blister develop on the dorsum of his left great toe that ruptured.  This was draining and apparently when he saw my partner last week, the erythema was present around the great toe.  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  Patient states that it looks similar to last week.  However I have taken a picture included below  As the pitcher demonstrates, there is erythema from the first MTP joint all the way to the tip of his right great toe.  There is also now erythema extending to the second third fourth and fifth toes.  Per the patient's report this is chronic.  There are shallow ulcerations on the dorsal surfaces of the second and third toe where the patient "stumped his toe against his walker" earlier this week.  There is no purulent foul-smelling drainage coming from any of the ulceration seen below.  Keeping them covered with a Band-Aid.  At that time, my plan was: 1. PVD (peripheral vascular disease) ([place])  2. Skin ulcer of right great toe, limited to breakdown of skin (HCC)  3. Cellulitis of right toe I am unable to appreciate any dorsalis pedis or posterior tibialis pulse in the right foot.  I reviewed his vascular ultrasound that he had June 2020.  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  I will switch antibiotics to cover anaerobic bacteria, as well as MRSA with clindamycin 300 mg p.o. 3 times daily.  I have applied Silvadene to the wounds, cover them with nonadherent gauze, and wrapped the foot in coban.  We have discussed daily dressing changes.  Reassess the patient on Monday.  Unfortunately I  feel that the situation is worsening.  If the redness is not improving at that point, I would recommend an MRI of the foot to evaluate possible osteomyelitis that may necessitate surgical consultation.  05/24/19    Please see above.  Erythema has faded particularly in the second through fourth toes.  Erythema is essentially unchanged in the left great toe however overall the color appears noticeably better than last week.  He denies any pain however he never had pain previously prior to having his foot amputated on the left.  He denies any fevers or chills or signs of systemic illness.  Please see the wounds above.  The ulceration on the dorsum of the left great toe has essentially changed very little however there is no purulent drainage.  The wounds are healing slowly on the dorsum of the second and third toes. Past Medical History:  Diagnosis Date  . Chronic diastolic CHF (congestive heart failure) ([place])   . CKD (chronic kidney disease), stage III ([place])   . Constipation   . Coronary artery disease    a. Cath February 2012 in [facility], occluded RCA with collaterals  . DM type 2 (diabetes mellitus, type 2) ([place])   . Essential hypertension   . Hyperlipidemia   . Neuropathy    feet  . Pacemaker    a. symptomatic brady after TAVR s/p MDT PPM by Dr. Curt Bears 12/04/17  . Persistent  atrial fibrillation   . PONV (postoperative nausea and vomiting)    after valve surgery  . PVD (peripheral vascular disease) ([place])    a. s/p R popliteal artery stenosis tx with drug-coated balloon 05/2014, followed by Dr. Fletcher Anon.  . 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 Severe aortic stenosis    a. 12/02/17: s/p TAVR  . Skin cancer   . Sleep apnea with use of continuous positive airway pressure (CPAP)    04-11-11 AHI was 32.9 and titrated to 15 cm H20, DME is [facility]  . Subclinical  hypothyroidism    Past Surgical History:  Procedure Laterality Date  . ABDOMINAL ANGIOGRAM N/A 06/08/2014   Procedure: ABDOMINAL ANGIOGRAM;  Surgeon: Wellington Hampshire, MD;  Location: [facility] CATH LAB;  Service: Cardiovascular;  Laterality: N/A;  . ABDOMINAL AORTOGRAM N/A 04/09/2018   Procedure: ABDOMINAL AORTOGRAM;  Surgeon: Conrad [place]oka, MD;  Location: [place] CV LAB;  Service: Cardiovascular;  Laterality: N/A;  . AMPUTATION Left 04/17/2018   Procedure: LEFT FOOT 3RD RAY AMPUTATION;  Surgeon: Newt Minion, MD;  Location: [place];  Service: Orthopedics;  Laterality: Left;  . AMPUTATION Left 05/28/2018   Procedure: LEFT AMPUTATION BELOW KNEE;  Surgeon: Wylene Simmer, MD;  Location: [place];  Service: Orthopedics;  Laterality: Left;  . APPENDECTOMY  1965  . 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 CARDIAC CATHETERIZATION  12/2010  . CARDIOVERSION  07/2011  . CARDIOVERSION N/A 04/18/2014   Procedure: CARDIOVERSION;  Surgeon: Dorothy Spark, MD;  Location: [place];  Service: Cardiovascular;  Laterality: N/A;  . CARDIOVERSION N/A 11/03/2015   Procedure: CARDIOVERSION;  Surgeon: Lelon Perla, MD;  Location[facility] ENDOSCOPY;  Service: Cardiovascular;  Laterality: N/A;  . CARDIOVERSION N/A 05/08/2017   Procedure: CARDIOVERSION;  Surgeon: Dorothy Spark, MD;  Location: [facility] ENDOSCOPY;  Service: Cardiovascular;  Laterality: N/A;  . CARDIOVERSION N/A 07/28/2017   Procedure: CARDIOVERSION;  Surgeon: Dorothy Spark, MD;  Location: [place];  Service: Cardiovascular;  Laterality: N/A;  . LOWER EXTREMITY ANGIOGRAM N/A 06/08/2014   Procedure: LOWER EXTREMITY ANGIOGRAM;  Surgeon: Wellington Hampshire, MD;  Location: [facility] CATH LAB;  Service: Cardiovascular;  Laterality: N/A;  . LOWER EXTREMITY ANGIOGRAPHY Left 04/09/2018   Procedure: Lower Extremity Angiography;  Surgeon: Conrad [place], MD;  Location: [place] CV LAB;  Service: Cardiovascular;  Laterality: Left;  . PACEMAKER IMPLANT N/A 12/04/2017    Procedure: PACEMAKER IMPLANT;  Surgeon: Constance Haw, MD;  Location: [place] CV LAB;  Service: Cardiovascular;  Laterality: N/A;  . PERIPHERAL VASCULAR BALLOON ANGIOPLASTY Left 04/09/2018   Procedure: PERIPHERAL VASCULAR BALLOON ANGIOPLASTY;  Surgeon: Conrad Garber, MD;  Location: [place] CV LAB;  Service: Cardiovascular;  Laterality: Left;  SFA  . POPLITEAL ARTERY ANGIOPLASTY Right 06/08/2014   Archie Endo 06/08/2014  . RIGHT/LEFT HEART CATH AND CORONARY ANGIOGRAPHY N/A 10/08/2017   Procedure: RIGHT/LEFT HEART CATH AND CORONARY ANGIOGRAPHY;  Surgeon: Burnell Blanks, MD;  Location: [place] CV LAB;  Service: Cardiovascular;  Laterality: N/A;  . SKIN CANCER EXCISION Bilateral    "have had them cut off back of neck X 2; off left upper arm; right wrist, near right shoulder blade" (06/08/2014)  . TEE WITHOUT CARDIOVERSION N/A 12/02/2017   Procedure: TRANSESOPHAGEAL ECHOCARDIOGRAM (TEE);  Surgeon: Burnell Blanks, MD;  Location: [place];  Service: Open Heart Surgery;  Laterality: N/A;  .  TEMPORARY PACEMAKER N/A 12/04/2017   Procedure: TEMPORARY PACEMAKER;  Surgeon: Leonie Man, MD;  Location: [place] CV LAB;  Service: Cardiovascular;  Laterality: N/A;  . TRANSCATHETER AORTIC VALVE REPLACEMENT, TRANSFEMORAL N/A 12/02/2017   Procedure: TRANSCATHETER AORTIC VALVE REPLACEMENT, TRANSFEMORAL;  Surgeon: Burnell Blanks, MD;  Location: [place];  Service: Open Heart Surgery;  Laterality: N/A;  using Edwards Sapien 3 Transcatheter Heart Valve size 66mm   Current Outpatient Medications on File Prior to Visit  Medication Sig Dispense Refill  . amiodarone (PACERONE) 200 MG tablet TAKE 1 TABLET BY MOUTH EVERY DAY 90 tablet 3  . atorvastatin (LIPITOR) 80 MG tablet Take 1 tablet (80 mg total) by mouth at bedtime. 90 tablet 3  . Blood Glucose Monitoring Suppl (FREESTYLE LITE) DEVI 1 each by Does not apply route 2 (two) times daily. 1 each 0  . Choline Fenofibrate (FENOFIBRIC ACID)  135 MG CPDR TAKE 1 CAPSULE DAILY 90 capsule 3  . clindamycin (CLEOCIN) 300 MG capsule Take 1 capsule (300 mg total) by mouth 3 (three) times daily. 30 capsule 0  . dabigatran (PRADAXA) 150 MG CAPS capsule Take 150 mg by mouth every 12 (twelve) hours.     . docusate sodium (COLACE) 100 MG capsule Take 100 mg by mouth 2 (two) times daily.    . furosemide (LASIX) 20 MG tablet Take 1 tablet (20 mg total) by mouth daily. (Patient taking differently: Take 20 mg by mouth every Monday, Wednesday, and Friday. ) 90 tablet 3  . gabapentin (NEURONTIN) 300 MG capsule Take 1 capsule (300 mg total) by mouth 3 (three) times daily. 90 capsule 5  . glipiZIDE (GLUCOTROL XL) 10 MG 24 hr tablet TAKE 1 TABLET (10 MG TOTAL) BY MOUTH DAILY WITH BREAKFAST. 90 tablet 1  . glucose blood (FREESTYLE LITE) test strip 1 each by Other route 4 (four) times daily -  before meals and at bedtime. 450 each 3  . insulin lispro (HUMALOG KWIKPEN) 100 UNIT/ML KwikPen Inject 5 units in the skin three times daily with meals (Patient taking differently: Inject 5 Units into the skin 2 (two) times daily. Inject 5 units in the skin twice a day with breakfast and lunch.) 15 pen 2  . Insulin Pen Needle (BD PEN NEEDLE NANO U/F) 32G X 4 MM MISC USE 4 (FOUR) TIMES DAILY. 1000 each 0  . levothyroxine (SYNTHROID, LEVOTHROID) 175 MCG tablet TAKE 1 TABLET BY MOUTH EVERY DAY 90 tablet 1  . metoprolol tartrate (LOPRESSOR) 50 MG tablet TAKE ONE AND ONE-HALF TABLET BY MOUTH TWICE A DAY 270 tablet 2  . mirabegron ER (MYRBETRIQ) 25 MG TB24 tablet Take 1 tablet (25 mg total) by mouth daily. 90 tablet 3  . mupirocin ointment (BACTROBAN) 2 % Place 1 application into the nose 2 (two) times daily. 30 g 0  . pentoxifylline (TRENTAL) 400 MG CR tablet Take 1 tablet (400 mg total) by mouth 3 (three) times daily with meals. 90 tablet 3  . TRESIBA FLEXTOUCH 200 UNIT/ML SOPN INJECT 66 UNITS INTO THE SKIN AT BEDTIME. 15 pen 2   No current facility-administered medications  on file prior to visit.    No Known Allergies Social History   Socioeconomic History  . Marital status: Married    Spouse name: [name]  . Number of children: 1  . Years of education: 37  . Highest education level: Not on file  Occupational History  . Occupation: retired    Fish farm manager: [employer]  . Financial  resource strain: Not on file  . Food insecurity    Worry: Not on file    Inability: Not on file  . Transportation needs    Medical: Not on file    Non-medical: Not on file  Tobacco Use  . Smoking status: Former Smoker    Packs/day: 2.00    Years: 14.00    Pack years: 28.00    Types: Cigarettes    Quit date: 11/11/1972    Years since quitting: 46.5  . Smokeless tobacco: Never Used  Substance and Sexual Activity  . Alcohol use: No    Comment: quit in 1984  . Drug use: No  . Sexual activity: Not Currently  Lifestyle  . Physical activity    Days per week: 0 days    Minutes per session: 0 min  . Stress: Not at all  Relationships  . Social Herbalist on phone: Not on file    Gets together: Not on file    Attends religious service: Not on file    Active member of club or organization: Not on file    Attends meetings of clubs or organizations: Not on file    Relationship status: Not on file  . Intimate partner violence    Fear of current or ex partner: Not on file    Emotionally abused: Not on file    Physically abused: Not on file    Forced sexual activity: Not on file  Other Topics Concern  . 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   Patient has one child and his wife has one child.   Patient is retired.   Patient has a high school education.   Patient is right-handed.   Patient drinks very little caffeine.    Review of Systems  All other systems reviewed and are negative.      Objective:   Physical Exam Vitals signs reviewed.  Cardiovascular:     Rate and  Rhythm: Normal rate and regular rhythm.  Pulmonary:     Effort: Pulmonary effort is normal.     Breath sounds: Normal breath sounds.  Musculoskeletal:     Right foot: Deformity and laceration present.          Assessment & Plan:   The primary encounter diagnosis was PVD (peripheral vascular disease) ([place]). Diagnoses of Skin ulcer of right great toe, limited to breakdown of skin ([place]) and Cellulitis of toe of right foot were also pertinent to this visit. Cellulitis appears to be improving based on the color change.  I recommended daily dressing changes.  The patient will apply Silvadene to the wounds on his foot.  He will then cover with nonadherent gauze and wrap with Coban.  Complete the clindamycin.  Reassess the patient here in 1 week or sooner if the toes appear to be worsening.

## 2019-05-26 ENCOUNTER — Other Ambulatory Visit: Payer: Self-pay

## 2019-05-26 ENCOUNTER — Encounter: Payer: Self-pay | Admitting: Physical Therapy

## 2019-05-26 ENCOUNTER — Ambulatory Visit: Payer: Medicare Other | Admitting: Physical Therapy

## 2019-05-26 DIAGNOSIS — M6281 Muscle weakness (generalized): Secondary | ICD-10-CM | POA: Diagnosis not present

## 2019-05-26 DIAGNOSIS — R293 Abnormal posture: Secondary | ICD-10-CM

## 2019-05-26 DIAGNOSIS — M6249 Contracture of muscle, multiple sites: Secondary | ICD-10-CM | POA: Diagnosis not present

## 2019-05-26 DIAGNOSIS — Z9181 History of falling: Secondary | ICD-10-CM | POA: Diagnosis not present

## 2019-05-26 DIAGNOSIS — R2681 Unsteadiness on feet: Secondary | ICD-10-CM

## 2019-05-26 DIAGNOSIS — R2689 Other abnormalities of gait and mobility: Secondary | ICD-10-CM | POA: Diagnosis not present

## 2019-05-27 NOTE — Therapy (Signed)
[facility] [address] [place], [name], [address] Phone: [phone]   Fax:  [phone]  Physical Therapy Treatment  Patient Details  Name: DEWARREN LEDBETTER MRN: [id number] Date of Birth: 1941-10-11 Referring Provider (PT): Mechele Claude, [place]   Encounter Date: 05/26/2019   CLINIC OPERATION CHANGES: [facility] is open at lower capacity following universal masking, social distancing, and patient screening.   PT End of Session - 05/26/19 1602    Visit Number  45    Number of Visits  53    Date for PT Re-Evaluation  07/09/19    Authorization Type  Medicare & Federal BCBS    Authorization Time Period  Medicare guidelines, Fed BCBS waves co-pay.  75 visit limit PT, OT & ST combined with 1 used.    Authorization - Visit Number  25    Authorization - Number of Visits  75    PT Start Time  1600    PT Stop Time  1650    PT Time Calculation (min)  50 min    Equipment Utilized During Treatment  Gait belt    Activity Tolerance  Patient tolerated treatment well;No increased pain;Patient limited by fatigue    Behavior During Therapy  [facility] for tasks assessed/performed       Past Medical History:  Diagnosis Date  . Chronic diastolic CHF (congestive heart failure) ([place])   . CKD (chronic kidney disease), stage III ([place])   . Constipation   . Coronary artery disease    a. Cath February 2012 in [facility], occluded RCA with collaterals  . DM type 2 (diabetes mellitus, type 2) ([place])   . Essential hypertension   . Hyperlipidemia   . Neuropathy    feet  . Pacemaker    a. symptomatic brady after TAVR s/p MDT PPM by Dr. Curt Bears 12/04/17  . Persistent atrial fibrillation   . PONV (postoperative nausea and vomiting)    after valve surgery  . PVD (peripheral vascular disease) ([place])    a. s/p R popliteal artery stenosis tx with drug-coated balloon 05/2014, followed by Dr. Fletcher Anon.  . 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 Severe aortic stenosis    a. 12/02/17: s/p TAVR  . Skin cancer   . Sleep apnea with use of continuous positive airway pressure (CPAP)    04-11-11 AHI was 32.9 and titrated to 15 cm H20, DME is [facility]  . Subclinical hypothyroidism     Past Surgical History:  Procedure Laterality Date  . ABDOMINAL ANGIOGRAM N/A 06/08/2014   Procedure: ABDOMINAL ANGIOGRAM;  Surgeon: Wellington Hampshire, MD;  Location: [facility] CATH LAB;  Service: Cardiovascular;  Laterality: N/A;  . ABDOMINAL AORTOGRAM N/A 04/09/2018   Procedure: ABDOMINAL AORTOGRAM;  Surgeon: Conrad Tribes Hill, MD;  Location: [place] CV LAB;  Service: Cardiovascular;  Laterality: N/A;  . AMPUTATION Left 04/17/2018   Procedure: LEFT FOOT 3RD RAY AMPUTATION;  Surgeon: Newt Minion, MD;  Location: [place];  Service: Orthopedics;  Laterality: Left;  . AMPUTATION Left 05/28/2018   Procedure: LEFT AMPUTATION BELOW KNEE;  Surgeon: Wylene Simmer, MD;  Location: [place];  Service: Orthopedics;  Laterality: Left;  . APPENDECTOMY  1965  . 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 CARDIAC CATHETERIZATION  12/2010  . CARDIOVERSION  07/2011  . CARDIOVERSION N/A 04/18/2014   Procedure: CARDIOVERSION;  Surgeon: Houston Siren  Hazel Sams, MD;  Location: [place];  Service: Cardiovascular;  Laterality: N/A;  . CARDIOVERSION N/A 11/03/2015   Procedure: CARDIOVERSION;  Surgeon: Lelon Perla, MD;  Location: [facility] ENDOSCOPY;  Service: Cardiovascular;  Laterality: N/A;  . CARDIOVERSION N/A 05/08/2017   Procedure: CARDIOVERSION;  Surgeon: Dorothy Spark, MD;  Location: [facility] ENDOSCOPY;  Service: Cardiovascular;  Laterality: N/A;  . CARDIOVERSION N/A 07/28/2017   Procedure: CARDIOVERSION;  Surgeon: Dorothy Spark, MD;  Location: [place];  Service: Cardiovascular;  Laterality: N/A;  . LOWER EXTREMITY ANGIOGRAM N/A 06/08/2014   Procedure: LOWER  EXTREMITY ANGIOGRAM;  Surgeon: Wellington Hampshire, MD;  Location: [facility] CATH LAB;  Service: Cardiovascular;  Laterality: N/A;  . LOWER EXTREMITY ANGIOGRAPHY Left 04/09/2018   Procedure: Lower Extremity Angiography;  Surgeon: Conrad Swall Meadows, MD;  Location: [place] CV LAB;  Service: Cardiovascular;  Laterality: Left;  . PACEMAKER IMPLANT N/A 12/04/2017   Procedure: PACEMAKER IMPLANT;  Surgeon: Constance Haw, MD;  Location: [place] CV LAB;  Service: Cardiovascular;  Laterality: N/A;  . PERIPHERAL VASCULAR BALLOON ANGIOPLASTY Left 04/09/2018   Procedure: PERIPHERAL VASCULAR BALLOON ANGIOPLASTY;  Surgeon: Conrad Edna, MD;  Location: [place] CV LAB;  Service: Cardiovascular;  Laterality: Left;  SFA  . POPLITEAL ARTERY ANGIOPLASTY Right 06/08/2014   Archie Endo 06/08/2014  . RIGHT/LEFT HEART CATH AND CORONARY ANGIOGRAPHY N/A 10/08/2017   Procedure: RIGHT/LEFT HEART CATH AND CORONARY ANGIOGRAPHY;  Surgeon: Burnell Blanks, MD;  Location: [place] CV LAB;  Service: Cardiovascular;  Laterality: N/A;  . SKIN CANCER EXCISION Bilateral    "have had them cut off back of neck X 2; off left upper arm; right wrist, near right shoulder blade" (06/08/2014)  . TEE WITHOUT CARDIOVERSION N/A 12/02/2017   Procedure: TRANSESOPHAGEAL ECHOCARDIOGRAM (TEE);  Surgeon: Burnell Blanks, MD;  Location: [place];  Service: Open Heart Surgery;  Laterality: N/A;  . TEMPORARY PACEMAKER N/A 12/04/2017   Procedure: TEMPORARY PACEMAKER;  Surgeon: Leonie Man, MD;  Location: [place] CV LAB;  Service: Cardiovascular;  Laterality: N/A;  . TRANSCATHETER AORTIC VALVE REPLACEMENT, TRANSFEMORAL N/A 12/02/2017   Procedure: TRANSCATHETER AORTIC VALVE REPLACEMENT, TRANSFEMORAL;  Surgeon: Burnell Blanks, MD;  Location: [place];  Service: Open Heart Surgery;  Laterality: N/A;  using Edwards Sapien 3 Transcatheter Heart Valve size 68mm    There were no vitals filed for this visit.  Subjective Assessment -  05/26/19 1602    Subjective  No falls. Has been using the cane steadily at home since last session. Brought his OTAGO book with him today.    Pertinent History  L TTA, CAD, PAF,  pacemaker, DM2, neuropathy, CKD, HTN,     Limitations  Lifting;Standing;Walking;House hold activities    Patient Stated Goals  To use prosthesis in community, travel (uses Lucianne Lei), take of himself so wife does not have to do it. Housework.     Currently in Pain?  No/denies    Pain Score  0-No pain           [facility] Adult PT Treatment/Exercise - 05/26/19 1603      Transfers   Transfers  Sit to Stand;Stand to Sit    Sit to Stand  5: Supervision;With upper extremity assist;With armrests;From chair/3-in-1;From bed    Stand to Sit  5: Supervision;With upper extremity assist;With armrests;To chair/3-in-1;To bed      Ambulation/Gait   Ambulation/Gait  Yes    Ambulation/Gait Assistance  5: Supervision    Ambulation/Gait Assistance Details  cues for posture  with gait.     Ambulation Distance (Feet)  80 Feet   x2, around gym    Assistive device  Rollator;Prosthesis    Gait Pattern  Step-through pattern;Decreased arm swing - left;Decreased step length - right;Decreased stance time - left;Decreased weight shift to left;Antalgic;Lateral hip instability;Abducted - left;Trunk flexed    Ambulation Surface  Level;Indoor      Prosthetics   Current prosthetic wear tolerance (days/week)   daily     Current prosthetic wear tolerance (#hours/day)   most of awake hours    Residual limb condition   intact per pt report    Donning Prosthesis  Modified independent (device/increased time)    Doffing Prosthesis  Modified independent (device/increased time)          Balance Exercises - 05/26/19 1604      OTAGO PROGRAM   Knee Bends  10 reps, support    Backwards Walking  Support    Walking and Turning Around  Assistive device   with rollator   Sideways Walking  Assistive device   with counter support   Tandem Stance  10  seconds, support    Tandem Walk  Support   counter/cane support   Sit to Stand  10 reps, bilateral support    Overall OTAGO Comments  pt using counter/cane for support/balance assistance. added figure 8 with rollator and sit<>stands to [place] today with no issues.          PT Education - 05/26/19 1632    Education Details  additions to [employer]) Educated  Patient    Methods  Explanation;Demonstration;Verbal cues;Handout    Comprehension  Verbalized understanding;Returned demonstration       PT Short Term Goals - 05/19/19 2252      PT SHORT TERM GOAL #1   Title  Patient verbalizes proper adjustment of ply sock fit with volume fluctuations. (All updated STGs Target Date 06/11/2019)    Time  4    Period  Weeks    Status  Revised    Target Date  06/11/19      PT SHORT TERM GOAL #2   Title  Patient verbalizes & demonstrates understanding of OTAGO HEP.    Time  4    Period  Weeks    Status  Revised    Target Date  06/11/19      PT SHORT TERM GOAL #3   Title  Patient negotiates ramps & curbs with straight cane quad tip & prosthesis with supervision    Time  4    Period  Weeks    Status  Revised    Target Date  06/11/19      PT SHORT TERM GOAL #4   Title  Patient ambulates 150' with straight cane quad tip with supervision.    Time  4    Period  Weeks    Status  Revised    Target Date  06/11/19        PT Long Term Goals - 04/20/19 0755      PT LONG TERM GOAL #1   Title  Patient verbalizes & demonstrates proper prosthetic care including donning / doffing & wear daily for >90% of awake hours without skin or limb pain issues independently to enable safe use of prosthesis (All LTGs Target Date: 07/09/2019)    Time  3    Period  Months    Status  On-going      PT LONG TERM GOAL #2  Title  Patient demonstrates & verbalizes understanding of ongoing HEP / fitness plan.     Time  3    Period  Months    Status  On-going      PT LONG TERM GOAL #3   Title   Berg Balance >/= 47/56 to indicate lower fall risk & less dependency in standing ADLs.     Time  3    Period  Months    Status  On-going      PT LONG TERM GOAL #4   Title  Patient ambulates 250' with straight cane quad tip & prosthesis modified independent.     Time  3    Period  Months    Status  On-going      PT LONG TERM GOAL #5   Title  Patient negotiates ramps, curbs with straight cane quad tip & stairs 1 rail /cane modified independent for community access.     Time  3    Period  Months    Status  On-going      PT LONG TERM GOAL #6   Title  Dynamic Gait Index with straight cane quad tip & prosthesis >12/24.     Time  3    Period  Months    Status  On-going      PT LONG TERM GOAL #7   Title  Gait Velocity with straight cane quad tip & prosthesis >1.8 ft/sec    Time  3    Period  Months    Status  On-going            Plan - 05/26/19 1602    Clinical Impression Statement  Today's skilled session focused on review of the OTAGO ex's performed last session. Pt brought his booklet in today so all new ex's were added with notes/cues to assist him at home. No issues noted in session. Pt without questions after review of written notes in session. The pt is progressing toward goals and should benefit from continued PT to progress toward unmet goals.    Rehab Potential  Good    PT Frequency  1x / week    PT Duration  12 weeks    PT Treatment/Interventions  ADLs/Self Care Home Management;Canalith Repostioning;DME Instruction;Gait training;Stair training;Functional mobility training;Therapeutic activities;Therapeutic exercise;Balance training;Neuromuscular re-education;Patient/family education;Prosthetic Training;Manual techniques;Vestibular    PT Next Visit Plan  work towards updated STGs- gait/barriers with cane/prosthesis, balance reactions.    Consulted and Agree with Plan of Care  Patient       Patient will benefit from skilled therapeutic intervention in order to improve  the following deficits and impairments:  Abnormal gait, Decreased activity tolerance, Decreased balance, Decreased endurance, Decreased knowledge of use of DME, Decreased mobility, Impaired flexibility, Decreased range of motion, Decreased strength, Decreased skin integrity, Dizziness, Postural dysfunction, Prosthetic Dependency  Visit Diagnosis: 1. Muscle weakness (generalized)   2. Other abnormalities of gait and mobility   3. Unsteadiness on feet   4. Abnormal posture        Problem List Patient Active Problem List   Diagnosis Date Noted  . Controlled type 2 diabetes mellitus without complication ([place]) 19/50/9326  . Bradycardia 09/01/2018  . Gangrene of left foot ([place]) 05/28/2018  . 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 Obstructive sleep apnea syndrome 05/04/2018  . Obstructive sleep apnea treated with continuous positive airway pressure (CPAP) 05/04/2018  . Type II diabetes mellitus, uncontrolled ([place]) 04/22/2018  . Atherosclerosis of native  arteries of the extremities with gangrene ([place]) 04/09/2018  . Atherosclerosis of native artery of extremity ([place]) 04/09/2018  . Cellulitis of right toe 04/04/2018  . Paroxysmal atrial fibrillation ([place]) 12/05/2017  . Cardiac pacemaker in situ   . Symptomatic bradycardia   . Asystole ([place])   . 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 History of aortic valve replacement 12/02/2017  . Severe aortic stenosis   . Bilateral impacted cerumen 01/22/2016  . Essential hypertension 11/14/2015  . PVD (peripheral vascular disease) ([place]) 05/24/2014  . Chronic kidney disease 08/30/2013  . Hypothyroidism 08/30/2013  . 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 Diabetes mellitus type 2, uncomplicated ([place])   . Aortic valve stenosis 07/25/2011  . Hyperlipidemia 01/10/2011  . CAD (coronary artery disease) 01/10/2011  . Coronary arteriosclerosis 01/10/2011    Willow Ora,  PTA, [facility] [address] [place],  [address] [phone] 05/27/19, 4:32 PM   Name: MALEKAI MARKWOOD MRN: [id number] Date of Birth: 1941-04-09

## 2019-05-31 ENCOUNTER — Ambulatory Visit (INDEPENDENT_AMBULATORY_CARE_PROVIDER_SITE_OTHER): Payer: Medicare Other | Admitting: Family Medicine

## 2019-05-31 ENCOUNTER — Other Ambulatory Visit: Payer: Self-pay

## 2019-05-31 ENCOUNTER — Encounter: Payer: Self-pay | Admitting: Family Medicine

## 2019-05-31 VITALS — BP 124/62 | HR 60 | Temp 98.0°F | Resp 18 | Ht 71.0 in | Wt 243.0 lb

## 2019-05-31 DIAGNOSIS — I739 Peripheral vascular disease, unspecified: Secondary | ICD-10-CM | POA: Diagnosis not present

## 2019-05-31 DIAGNOSIS — Z13220 Encounter for screening for lipoid disorders: Secondary | ICD-10-CM | POA: Diagnosis not present

## 2019-05-31 DIAGNOSIS — I779 Disorder of arteries and arterioles, unspecified: Secondary | ICD-10-CM | POA: Diagnosis not present

## 2019-05-31 NOTE — Progress Notes (Signed)
Subjective:    Patient ID: Steven Maldonado, male    DOB: 1941-10-26, 78 y.o.   MRN: [id number]  HPI 05/20/19 Patient saw my partner last week and was started on Bactrim for possible cellulitis in the right foot.  Patient states he spontaneously had a blister develop on the dorsum of his left great toe that ruptured.  This was draining and apparently when he saw my partner last week, the erythema was present around the great toe.  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  Patient states that it looks similar to last week.  However I have taken a picture included below  As the pitcher demonstrates, there is erythema from the first MTP joint all the way to the tip of his right great toe.  There is also now erythema extending to the second third fourth and fifth toes.  Per the patient's report this is chronic.  There are shallow ulcerations on the dorsal surfaces of the second and third toe where the patient "stumped his toe against his walker" earlier this week.  There is no purulent foul-smelling drainage coming from any of the ulceration seen below.  Keeping them covered with a Band-Aid.  At that time, my plan was: 1. PVD (peripheral vascular disease) ([place])  2. Skin ulcer of right great toe, limited to breakdown of skin (HCC)  3. Cellulitis of right toe I am unable to appreciate any dorsalis pedis or posterior tibialis pulse in the right foot.  I reviewed his vascular ultrasound that he had June 2020.  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  I will switch antibiotics to cover anaerobic bacteria, as well as MRSA with clindamycin 300 mg p.o. 3 times daily.  I have applied Silvadene to the wounds, cover them with nonadherent gauze, and wrapped the foot in coban.  We have discussed daily dressing changes.  Reassess the patient on Monday.  Unfortunately I  feel that the situation is worsening.  If the redness is not improving at that point, I would recommend an MRI of the foot to evaluate possible osteomyelitis that may necessitate surgical consultation.  05/24/19    Please see above.  Erythema has faded particularly in the second through fourth toes.  Erythema is essentially unchanged in the left great toe however overall the color appears noticeably better than last week.  He denies any pain however he never had pain previously prior to having his foot amputated on the left.  He denies any fevers or chills or signs of systemic illness.  Please see the wounds above.  The ulceration on the dorsum of the left great toe has essentially changed very little however there is no purulent drainage.  The wounds are healing slowly on the dorsum of the second and third toes.  At that time, my plan was: Cellulitis appears to be improving based on the color change.  I recommended daily dressing changes.  The patient will apply Silvadene to the wounds on his foot.  He will then cover with nonadherent gauze and wrap with Coban.  Complete the clindamycin.  Reassess the patient here in 1 week or sooner if the toes appear to be worsening.  05/31/19  The wound on the second and third toe have healed almost completely.  Although difficult to see on the picture above, the wound on the dorsum  of the great toe has healed.  There is now thick yellow wear previously was a shallow ulcer.  There is no weeping or evidence of secondary cellulitis. Past Medical History:  Diagnosis Date   Chronic diastolic CHF (congestive heart failure) (HCC)    CKD (chronic kidney disease), stage III (HCC)    Constipation    Coronary artery disease    a. Cath February 2012 in [facility], occluded RCA with collaterals   DM type 2 (diabetes mellitus, type 2) ([place])    Essential hypertension    Hyperlipidemia    Neuropathy    feet   Pacemaker    a. symptomatic brady after TAVR s/p MDT  PPM by Dr. Curt Bears 12/04/17   Persistent atrial fibrillation    PONV (postoperative nausea and vomiting)    after valve surgery   PVD (peripheral vascular disease) ([place])    a. s/p R popliteal artery stenosis tx with drug-coated balloon 05/2014, followed by Dr. Fletcher Anon.   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 12/02/17: s/p TAVR   Skin cancer    Sleep apnea with use of continuous positive airway pressure (CPAP)    04-11-11 AHI was 32.9 and titrated to 15 cm H20, DME is [facility]   Subclinical hypothyroidism    Past Surgical History:  Procedure Laterality Date   ABDOMINAL ANGIOGRAM N/A 06/08/2014   Procedure: ABDOMINAL ANGIOGRAM;  Surgeon: Wellington Hampshire, MD;  Location: [facility];  Service: Cardiovascular;  Laterality: N/A;   ABDOMINAL AORTOGRAM N/A 04/09/2018   Procedure: ABDOMINAL AORTOGRAM;  Surgeon: Conrad Bel-Ridge, MD;  Location: [facility];  Service: Cardiovascular;  Laterality: N/A;   AMPUTATION Left 04/17/2018   Procedure: LEFT FOOT 3RD RAY AMPUTATION;  Surgeon: Newt Minion, MD;  Location: [facility];  Service: Orthopedics;  Laterality: Left;   AMPUTATION Left 05/28/2018   Procedure: LEFT AMPUTATION BELOW KNEE;  Surgeon: Wylene Simmer, MD;  Location: [facility];  Service: Orthopedics;  Laterality: Left;   APPENDECTOMY  1965   BELOW KNEE LEG AMPUTATION Left 05/28/2018   CARDIAC CATHETERIZATION  12/2010   CARDIOVERSION  07/2011   CARDIOVERSION N/A 04/18/2014   Procedure: CARDIOVERSION;  Surgeon: Dorothy Spark, MD;  Location: [facility];  Service: Cardiovascular;  Laterality: N/A;   CARDIOVERSION N/A 11/03/2015   Procedure: CARDIOVERSION;  Surgeon: Lelon Perla, MD;  Location: [facility];  Service: Cardiovascular;  Laterality: N/A;   CARDIOVERSION N/A 05/08/2017   Procedure: CARDIOVERSION;  Surgeon: Dorothy Spark, MD;  Location: [facility];  Service: Cardiovascular;  Laterality: N/A;   CARDIOVERSION N/A 07/28/2017   Procedure: CARDIOVERSION;  Surgeon: Dorothy Spark, MD;  Location: [facility];  Service: Cardiovascular;  Laterality: N/A;   LOWER EXTREMITY ANGIOGRAM N/A 06/08/2014   Procedure: LOWER EXTREMITY ANGIOGRAM;  Surgeon: Wellington Hampshire, MD;  Location: [facility] C[facility] LAB;  Service: Cardiovascular;  Laterality: N/A;   LOWER EXTREMITY ANGIOGRAPHY Left 04/09/2018   Procedure: Lower Extremity Angiography;  Surgeon: Conrad [place] Creek, MD;  Location: [place] CV LAB;  Service: Cardiovascular;  Laterality: Left;   PACEMAKER IMPLANT N/A 12/04/2017   Procedure: PACEMAKER IMPLANT;  Surgeon: Constance Haw, MD;  Location: [facility];  Service: Cardiovascular;  Laterality: N/A;   PERIPHERAL VASCULAR BALLOON ANGIOPLASTY Left 04/09/2018   Procedure: PERIPHERAL VASCULAR BALLOON ANGIOPLASTY;  Surgeon: Conrad Shubuta,  MD;  Location: [facility];  Service: Cardiovascular;  Laterality: Left;  SFA   POPLITEAL ARTERY ANGIOPLASTY Right 06/08/2014   Archie Endo 06/08/2014   RIGHT/LEFT HEART CATH AND CORONARY ANGIOGRAPHY N/A 10/08/2017   Procedure: RIGHT/LEFT HEART CATH AND CORONARY ANGIOGRAPHY;  Surgeon: Burnell Blanks, MD;  Location: [facility];  Service: Cardiovascular;  Laterality: N/A;   SKIN CANCER EXCISION Bilateral    "have had them cut off back of neck X 2; off left upper arm; right wrist, near right shoulder blade" (06/08/2014)   TEE WITHOUT CARDIOVERSION N/A 12/02/2017   Procedure: TRANSESOPHAGEAL ECHOCARDIOGRAM (TEE);  Surgeon: Burnell Blanks, MD;  Location: [facility];  Service: Open Heart Surgery;  Laterality: N/A;   TEMPORARY PACEMAKER N/A 12/04/2017   Procedure: TEMPORARY PACEMAKER;  Surgeon: Leonie Man, MD;  Location: [facility];  Service: Cardiovascular;  Laterality: N/A;   TRANSCATHETER AORTIC VALVE REPLACEMENT, TRANSFEMORAL N/A 12/02/2017    Procedure: TRANSCATHETER AORTIC VALVE REPLACEMENT, TRANSFEMORAL;  Surgeon: Burnell Blanks, MD;  Location: [facility];  Service: Open Heart Surgery;  Laterality: N/A;  using Edwards Sapien 3 Transcatheter Heart Valve size 37mm   Current Outpatient Medications on File Prior to Visit  Medication Sig Dispense Refill   amiodarone (PACERONE) 200 MG tablet TAKE 1 TABLET BY MOUTH EVERY DAY 90 tablet 3   atorvastatin (LIPITOR) 80 MG tablet Take 1 tablet (80 mg total) by mouth at bedtime. 90 tablet 3   Blood Glucose Monitoring Suppl (FREESTYLE LITE) DEVI 1 each by Does not apply route 2 (two) times daily. 1 each 0   Choline Fenofibrate (FENOFIBRIC ACID) 135 MG CPDR TAKE 1 CAPSULE DAILY 90 capsule 3   dabigatran (PRADAXA) 150 MG CAPS capsule Take 150 mg by mouth every 12 (twelve) hours.      docusate sodium (COLACE) 100 MG capsule Take 100 mg by mouth 2 (two) times daily.     furosemide (LASIX) 20 MG tablet Take 1 tablet (20 mg total) by mouth daily. (Patient taking differently: Take 20 mg by mouth every Monday, Wednesday, and Friday. ) 90 tablet 3   gabapentin (NEURONTIN) 300 MG capsule Take 1 capsule (300 mg total) by mouth 3 (three) times daily. 90 capsule 5   glipiZIDE (GLUCOTROL XL) 10 MG 24 hr tablet TAKE 1 TABLET (10 MG TOTAL) BY MOUTH DAILY WITH BREAKFAST. 90 tablet 1   glucose blood (FREESTYLE LITE) test strip 1 each by Other route 4 (four) times daily -  before meals and at bedtime. 450 each 3   insulin lispro (HUMALOG KWIKPEN) 100 UNIT/ML KwikPen Inject 5 units in the skin three times daily with meals (Patient taking differently: Inject 5 Units into the skin 2 (two) times daily. Inject 5 units in the skin twice a day with breakfast and lunch.) 15 pen 2   Insulin Pen Needle (BD PEN NEEDLE NANO U/F) 32G X 4 MM MISC USE 4 (FOUR) TIMES DAILY. 1000 each 0   levothyroxine (SYNTHROID, LEVOTHROID) 175 MCG tablet TAKE 1 TABLET BY MOUTH EVERY DAY 90 tablet 1   metoprolol tartrate (LOPRESSOR)  50 MG tablet TAKE ONE AND ONE-HALF TABLET BY MOUTH TWICE A DAY 270 tablet 2   mirabegron ER (MYRBETRIQ) 25 MG TB24 tablet Take 1 tablet (25 mg total) by mouth daily. 90 tablet 3   mupirocin ointment (BACTROBAN) 2 % Place 1 application into the nose 2 (two) times daily. 30 g 0   pentoxifylline (TRENTAL) 400 MG CR tablet Take 1 tablet (400  mg total) by mouth 3 (three) times daily with meals. 90 tablet 3   silver sulfADIAZINE (SILVADENE) 1 % cream Apply 1 application topically daily. 50 g 0   TRESIBA FLEXTOUCH 200 UNIT/ML SOPN INJECT 66 UNITS INTO THE SKIN AT BEDTIME. 15 pen 2   clindamycin (CLEOCIN) 300 MG capsule Take 1 capsule (300 mg total) by mouth 3 (three) times daily. (Patient not taking: Reported on 05/31/2019) 30 capsule 0   No current facility-administered medications on file prior to visit.    No Known Allergies Social History   Socioeconomic History   Marital status: Married    Spouse name: [name]   Number of children: 1   Years of education: 12   Highest education level: Not on file  Occupational History   Occupation: retired    Fish farm manager: Psychologist, clinical strain: Not on file   Food insecurity    Worry: Not on file    Inability: Not on file   Transportation needs    Medical: Not on file    Non-medical: Not on file  Tobacco Use   Smoking status: Former Smoker    Packs/day: 2.00    Years: 14.00    Pack years: 28.00    Types: Cigarettes    Quit date: 11/11/1972    Years since quitting: 46.5   Smokeless tobacco: Never Used  Substance and Sexual Activity   Alcohol use: No    Comment: quit in 1984   Drug use: No   Sexual activity: Not Currently  Lifestyle   Physical activity    Days per week: 0 days    Minutes per session: 0 min   Stress: Not at all  Relationships   Social connections    Talks on phone: Not on file    Gets together: Not on file    Attends religious service: Not on file    Active member  of club or organization: Not on file    Attends meetings of clubs or organizations: Not on file    Relationship status: Not on file   Intimate partner violence    Fear of current or ex partner: Not on file    Emotionally abused: Not on file    Physically abused: Not on file    Forced sexual activity: Not on file  Other Topics Concern   Not on file  Social History Narrative   Patient is married ([place]) and lives at home with his wife.   Patient has one child and his wife has one child.   Patient is retired.   Patient has a high school education.   Patient is right-handed.   Patient drinks very little caffeine.    Review of Systems  All other systems reviewed and are negative.      Objective:   Physical Exam Vitals signs reviewed.  Cardiovascular:     Rate and Rhythm: Normal rate and regular rhythm.  Pulmonary:     Effort: Pulmonary effort is normal.     Breath sounds: Normal breath sounds.  Musculoskeletal:     Right foot: Deformity and laceration present.          Assessment & Plan:   The encounter diagnosis was PVD (peripheral vascular disease) ([place]). Wound seems to be healing well.  I recommended continue dressing changes for an additional 1 week and then I anticipate that the wound will be completely healed.  He can follow-up in 1 week if the wound is not  100% better or sooner if worse.  We discussed managing risk factors such as avoiding tobacco, taking his anticoagulant, managing his blood sugar, managing his blood pressure, managing, his cholesterol.  I will check a fasting lipid panel today.  Ideally I like his LDL cholesterol below 70.  Otherwise his risk factors are well managed.

## 2019-06-01 LAB — COMPLETE METABOLIC PANEL WITHOUT GFR
AG Ratio: 1.5 (calc) (ref 1.0–2.5)
ALT: 27 U/L (ref 9–46)
AST: 23 U/L (ref 10–35)
Albumin: 3.9 g/dL (ref 3.6–5.1)
Alkaline phosphatase (APISO): 31 U/L — ABNORMAL LOW (ref 35–144)
BUN/Creatinine Ratio: 17 (calc) (ref 6–22)
BUN: 27 mg/dL — ABNORMAL HIGH (ref 7–25)
CO2: 22 mmol/L (ref 20–32)
Calcium: 9.8 mg/dL (ref 8.6–10.3)
Chloride: 106 mmol/L (ref 98–110)
Creat: 1.55 mg/dL — ABNORMAL HIGH (ref 0.70–1.18)
GFR, Est African American: 50 mL/min/1.73m2 — ABNORMAL LOW
GFR, Est Non African American: 43 mL/min/1.73m2 — ABNORMAL LOW
Globulin: 2.6 g/dL (ref 1.9–3.7)
Glucose, Bld: 166 mg/dL — ABNORMAL HIGH (ref 65–99)
Potassium: 4.9 mmol/L (ref 3.5–5.3)
Sodium: 138 mmol/L (ref 135–146)
Total Bilirubin: 0.4 mg/dL (ref 0.2–1.2)
Total Protein: 6.5 g/dL (ref 6.1–8.1)

## 2019-06-01 LAB — CBC WITH DIFFERENTIAL/PLATELET
Absolute Monocytes: 610 {cells}/uL (ref 200–950)
Basophils Absolute: 91 {cells}/uL (ref 0–200)
Basophils Relative: 1.6 %
Eosinophils Absolute: 262 {cells}/uL (ref 15–500)
Eosinophils Relative: 4.6 %
HCT: 38.5 % (ref 38.5–50.0)
Hemoglobin: 12.9 g/dL — ABNORMAL LOW (ref 13.2–17.1)
Lymphs Abs: 1448 {cells}/uL (ref 850–3900)
MCH: 31.9 pg (ref 27.0–33.0)
MCHC: 33.5 g/dL (ref 32.0–36.0)
MCV: 95.1 fL (ref 80.0–100.0)
MPV: 12.3 fL (ref 7.5–12.5)
Monocytes Relative: 10.7 %
Neutro Abs: 3289 {cells}/uL (ref 1500–7800)
Neutrophils Relative %: 57.7 %
Platelets: 146 Thousand/uL (ref 140–400)
RBC: 4.05 Million/uL — ABNORMAL LOW (ref 4.20–5.80)
RDW: 12.6 % (ref 11.0–15.0)
Total Lymphocyte: 25.4 %
WBC: 5.7 Thousand/uL (ref 3.8–10.8)

## 2019-06-01 LAB — LIPID PANEL
Cholesterol: 219 mg/dL — ABNORMAL HIGH (ref <200)
HDL: 36 mg/dL — ABNORMAL LOW (ref >=40)
LDL Cholesterol (Calc): 142 mg/dL (calc) — ABNORMAL HIGH
Non-HDL Cholesterol (Calc): 183 mg/dL (calc) — ABNORMAL HIGH (ref <130)
Total CHOL/HDL Ratio: 6.1 (calc) — ABNORMAL HIGH (ref <5.0)
Triglycerides: 264 mg/dL — ABNORMAL HIGH (ref <150)

## 2019-06-02 ENCOUNTER — Other Ambulatory Visit: Payer: Self-pay

## 2019-06-02 ENCOUNTER — Ambulatory Visit: Payer: Medicare Other | Admitting: Physical Therapy

## 2019-06-02 ENCOUNTER — Encounter: Payer: Self-pay | Admitting: Physical Therapy

## 2019-06-02 DIAGNOSIS — R293 Abnormal posture: Secondary | ICD-10-CM | POA: Diagnosis not present

## 2019-06-02 DIAGNOSIS — R2689 Other abnormalities of gait and mobility: Secondary | ICD-10-CM | POA: Diagnosis not present

## 2019-06-02 DIAGNOSIS — M6281 Muscle weakness (generalized): Secondary | ICD-10-CM

## 2019-06-02 DIAGNOSIS — R2681 Unsteadiness on feet: Secondary | ICD-10-CM

## 2019-06-02 DIAGNOSIS — M6249 Contracture of muscle, multiple sites: Secondary | ICD-10-CM | POA: Diagnosis not present

## 2019-06-02 DIAGNOSIS — Z9181 History of falling: Secondary | ICD-10-CM | POA: Diagnosis not present

## 2019-06-02 LAB — HM DIABETES EYE EXAM

## 2019-06-03 ENCOUNTER — Other Ambulatory Visit: Payer: Self-pay | Admitting: Endocrinology

## 2019-06-03 DIAGNOSIS — E1165 Type 2 diabetes mellitus with hyperglycemia: Secondary | ICD-10-CM

## 2019-06-03 DIAGNOSIS — Z794 Long term (current) use of insulin: Secondary | ICD-10-CM

## 2019-06-03 NOTE — Therapy (Signed)
[facility] [address] [place], [name], [address] Phone: [phone]   Fax:  [phone]  Physical Therapy Treatment  Patient Details  Name: Steven Maldonado MRN: [id number] Date of Birth: 01/31/1941 Referring Provider (PT): Mechele Claude, [place]   Encounter Date: 06/02/2019   CLINIC OPERATION CHANGES: [facility]patient [facility] is open at lower capacity following universal masking, social distancing, and patient screening.  The patient's COVID risk of complications score is 7.   PT End of Session - 06/02/19 1701    Visit Number  46    Number of Visits  53    Date for PT Re-Evaluation  07/09/19    Authorization Type  Medicare & Federal BCBS    Authorization Time Period  Medicare guidelines, Fed BCBS waves co-pay.  75 visit limit PT, OT & ST combined with 1 used.    Authorization - Visit Number  26    Authorization - Number of Visits  75    PT Start Time  1600    PT Stop Time  1648    PT Time Calculation (min)  48 min    Equipment Utilized During Treatment  Gait belt    Activity Tolerance  Patient tolerated treatment well;No increased pain;Patient limited by fatigue    Behavior During Therapy  [facility] for tasks assessed/performed       Past Medical History:  Diagnosis Date  . Chronic diastolic CHF (congestive heart failure) ([place])   . CKD (chronic kidney disease), stage III ([place])   . Constipation   . Coronary artery disease    a. Cath February 2012 in [facility], occluded RCA with collaterals  . DM type 2 (diabetes mellitus, type 2) ([place])   . Essential hypertension   . Hyperlipidemia   . Neuropathy    feet  . Pacemaker    a. symptomatic brady after TAVR s/p MDT PPM by Dr. Curt Bears 12/04/17  . Persistent atrial fibrillation   . PONV (postoperative nausea and vomiting)    after valve surgery  . PVD (peripheral vascular disease) ([place])    a. s/p R popliteal artery stenosis tx with drug-coated balloon 05/2014, followed by Dr.  Fletcher Anon.  . 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 Severe aortic stenosis    a. 12/02/17: s/p TAVR  . Skin cancer   . Sleep apnea with use of continuous positive airway pressure (CPAP)    04-11-11 AHI was 32.9 and titrated to 15 cm H20, DME is [facility]  . Subclinical hypothyroidism     Past Surgical History:  Procedure Laterality Date  . ABDOMINAL ANGIOGRAM N/A 06/08/2014   Procedure: ABDOMINAL ANGIOGRAM;  Surgeon: Wellington Hampshire, MD;  Location: [facility] CATH LAB;  Service: Cardiovascular;  Laterality: N/A;  . ABDOMINAL AORTOGRAM N/A 04/09/2018   Procedure: ABDOMINAL AORTOGRAM;  Surgeon: Conrad Winns[place] Mills, MD;  Location: [place] CV LAB;  Service: Cardiovascular;  Laterality: N/A;  . AMPUTATION Left 04/17/2018   Procedure: LEFT FOOT 3RD RAY AMPUTATION;  Surgeon: Newt Minion, MD;  Location: [facility];  Service: Orthopedics;  Laterality: Left;  . AMPUTATION Left 05/28/2018   Procedure: LEFT AMPUTATION BELOW KNEE;  Surgeon: Wylene Simmer, MD;  Location: [facility];  Service: Orthopedics;  Laterality: Left;  . APPENDECTOMY  1965  . 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 CARDIAC CATHETERIZATION  12/2010  . CARDIOVERSION  07/2011  .  CARDIOVERSION N/A 04/18/2014   Procedure: CARDIOVERSION;  Surgeon: Dorothy Spark, MD;  Location: [facility];  Service: Cardiovascular;  Laterality: N/A;  . CARDIOVERSION N/A 11/03/2015   Procedure: CARDIOVERSION;  Surgeon: Lelon Perla, MD;  Location: [facility] ENDOSCOPY;  Service: Cardiovascular;  Laterality: N/A;  . CARDIOVERSION N/A 05/08/2017   Procedure: CARDIOVERSION;  Surgeon: Dorothy Spark, MD;  Location: [facility] ENDOSCOPY;  Service: Cardiovascular;  Laterality: N/A;  . CARDIOVERSION N/A 07/28/2017   Procedure: CARDIOVERSION;  Surgeon: Dorothy Spark, MD;  Location: [facility];  Service: Cardiovascular;  Laterality: N/A;  . LOWER EXTREMITY  ANGIOGRAM N/A 06/08/2014   Procedure: LOWER EXTREMITY ANGIOGRAM;  Surgeon: Wellington Hampshire, MD;  Location: [facility] CATH LAB;  Service: Cardiovascular;  Laterality: N/A;  . LOWER EXTREMITY ANGIOGRAPHY Left 04/09/2018   Procedure: Lower Extremity Angiography;  Surgeon: Conrad Pray, MD;  Location: [facility] [place] CV LAB;  Service: Cardiovascular;  Laterality: Left;  . PACEMAKER IMPLANT N/A 12/04/2017   Procedure: PACEMAKER IMPLANT;  Surgeon: Constance Haw, MD;  Location: [place] CV LAB;  Service: Cardiovascular;  Laterality: N/A;  . PERIPHERAL VASCULAR BALLOON ANGIOPLASTY Left 04/09/2018   Procedure: PERIPHERAL VASCULAR BALLOON ANGIOPLASTY;  Surgeon: Conrad Parmelee, MD;  Location: [place] CV LAB;  Service: Cardiovascular;  Laterality: Left;  SFA  . POPLITEAL ARTERY ANGIOPLASTY Right 06/08/2014   Archie Endo 06/08/2014  . RIGHT/LEFT HEART CATH AND CORONARY ANGIOGRAPHY N/A 10/08/2017   Procedure: RIGHT/LEFT HEART CATH AND CORONARY ANGIOGRAPHY;  Surgeon: Burnell Blanks, MD;  Location: [place] CV LAB;  Service: Cardiovascular;  Laterality: N/A;  . SKIN CANCER EXCISION Bilateral    "have had them cut off back of neck X 2; off left upper arm; right wrist, near right shoulder blade" (06/08/2014)  . TEE WITHOUT CARDIOVERSION N/A 12/02/2017   Procedure: TRANSESOPHAGEAL ECHOCARDIOGRAM (TEE);  Surgeon: Burnell Blanks, MD;  Location: [facility];  Service: Open Heart Surgery;  Laterality: N/A;  . TEMPORARY PACEMAKER N/A 12/04/2017   Procedure: TEMPORARY PACEMAKER;  Surgeon: Leonie Man, MD;  Location: [place] CV LAB;  Service: Cardiovascular;  Laterality: N/A;  . TRANSCATHETER AORTIC VALVE REPLACEMENT, TRANSFEMORAL N/A 12/02/2017   Procedure: TRANSCATHETER AORTIC VALVE REPLACEMENT, TRANSFEMORAL;  Surgeon: Burnell Blanks, MD;  Location: [facility];  Service: Open Heart Surgery;  Laterality: N/A;  using Edwards Sapien 3 Transcatheter Heart Valve size 70mm    There were no vitals filed for  this visit.  Subjective Assessment - 06/02/19 1558    Subjective  He has been doing the exercises and they are going well. No falls. He using the cane 4 laps around house 2x/day.    Pertinent History  L TTA, CAD, PAF,  pacemaker, DM2, neuropathy, CKD, HTN,     Limitations  Lifting;Standing;Walking;House hold activities    Patient Stated Goals  To use prosthesis in community, travel (uses Lucianne Lei), take of himself so wife does not have to do it. Housework.     Currently in Pain?  No/denies                       [facility] Adult PT Treatment/Exercise - 06/02/19 1647      Transfers   Transfers  Sit to Stand;Stand to Sit    Sit to Stand  5: Supervision;With upper extremity assist;With armrests;From chair/3-in-1   to cane   Stand to Sit  5: Supervision;With upper extremity assist;With armrests;To chair/3-in-1   from cane     Ambulation/Gait   Ambulation/Gait  Yes    Ambulation/Gait Assistance  5: Supervision    Ambulation/Gait Assistance Details  verbal cues on upright posture & wt shift over prosthesis    Ambulation Distance (Feet)  150 Feet   150' X 2   Assistive device  Rollator;Prosthesis;Straight cane   cane quad tip   Gait Pattern  Step-through pattern;Decreased arm swing - left;Decreased step length - right;Decreased stance time - left;Decreased weight shift to left;Antalgic;Lateral hip instability;Abducted - left;Trunk flexed    Ambulation Surface  Indoor;Level    Door Management  5: Supervision    Door Managment Details (indicate cue type and reason)  closing "sun room" door behind him using cane to pull door until within reach. Pt return demo understanding.     Curb  5: Supervision;4: Min assist   MinA with cane only & progressed to SBA with door frame    Curb Details (indicate cue type and reason)  PT simulated single step from his house down to sun room (door swings into sun room on left side) where he likes to "hang out"  PT demo & verbal cues on using door frame with  LUE and cane in RUE.  Pt return demo understanding.  Progressed to carrying cup with handle with water & placing on table just inside sun room. Pt return demo understanding.  4 reps 3sets with seated rest       Prosthetics   Current prosthetic wear tolerance (days/week)   daily     Current prosthetic wear tolerance (#hours/day)   most of awake hours    Residual limb condition   intact per pt report             PT Education - 06/02/19 1644    Education Details  Endurance walking program with rollator walker not cane, Use cane in home to walk room to room for household activities    Person(s) Educated  Patient    Methods  Explanation;Verbal cues    Comprehension  Verbalized understanding       PT Short Term Goals - 05/19/19 2252      PT SHORT TERM GOAL #1   Title  Patient verbalizes proper adjustment of ply sock fit with volume fluctuations. (All updated STGs Target Date 06/11/2019)    Time  4    Period  Weeks    Status  Revised    Target Date  06/11/19      PT SHORT TERM GOAL #2   Title  Patient verbalizes & demonstrates understanding of OTAGO HEP.    Time  4    Period  Weeks    Status  Revised    Target Date  06/11/19      PT SHORT TERM GOAL #3   Title  Patient negotiates ramps & curbs with straight cane quad tip & prosthesis with supervision    Time  4    Period  Weeks    Status  Revised    Target Date  06/11/19      PT SHORT TERM GOAL #4   Title  Patient ambulates 150' with straight cane quad tip with supervision.    Time  4    Period  Weeks    Status  Revised    Target Date  06/11/19        PT Long Term Goals - 04/20/19 0755      PT LONG TERM GOAL #1   Title  Patient verbalizes & demonstrates proper prosthetic care including donning / doffing &  wear daily for >90% of awake hours without skin or limb pain issues independently to enable safe use of prosthesis (All LTGs Target Date: 07/09/2019)    Time  3    Period  Months    Status  On-going      PT LONG  TERM GOAL #2   Title  Patient demonstrates & verbalizes understanding of ongoing HEP / fitness plan.     Time  3    Period  Months    Status  On-going      PT LONG TERM GOAL #3   Title  Berg Balance >/= 47/56 to indicate lower fall risk & less dependency in standing ADLs.     Time  3    Period  Months    Status  On-going      PT LONG TERM GOAL #4   Title  Patient ambulates 250' with straight cane quad tip & prosthesis modified independent.     Time  3    Period  Months    Status  On-going      PT LONG TERM GOAL #5   Title  Patient negotiates ramps, curbs with straight cane quad tip & stairs 1 rail /cane modified independent for community access.     Time  3    Period  Months    Status  On-going      PT LONG TERM GOAL #6   Title  Dynamic Gait Index with straight cane quad tip & prosthesis >12/24.     Time  3    Period  Months    Status  On-going      PT LONG TERM GOAL #7   Title  Gait Velocity with straight cane quad tip & prosthesis >1.8 ft/sec    Time  3    Period  Months    Status  On-going            Plan - 06/02/19 1655    Clinical Impression Statement  Today's skilled session focused on simulating negotiation of single step from sun room to house with cane. PT progressed to carrying cup (placing on table while negotiating step) and closing door behind him once in house. Pt verbalized & demonstrated better understanding by end of session with multiple reps.    Rehab Potential  Good    PT Frequency  1x / week    PT Duration  12 weeks    PT Treatment/Interventions  ADLs/Self Care Home Management;Canalith Repostioning;DME Instruction;Gait training;Stair training;Functional mobility training;Therapeutic activities;Therapeutic exercise;Balance training;Neuromuscular re-education;Patient/family education;Prosthetic Training;Manual techniques;Vestibular    PT Next Visit Plan  work towards updated STGs- gait/barriers with cane/prosthesis, balance reactions.    Consulted  and Agree with Plan of Care  Patient       Patient will benefit from skilled therapeutic intervention in order to improve the following deficits and impairments:  Abnormal gait, Decreased activity tolerance, Decreased balance, Decreased endurance, Decreased knowledge of use of DME, Decreased mobility, Impaired flexibility, Decreased range of motion, Decreased strength, Decreased skin integrity, Dizziness, Postural dysfunction, Prosthetic Dependency  Visit Diagnosis: 1. Muscle weakness (generalized)   2. Other abnormalities of gait and mobility   3. Unsteadiness on feet   4. Abnormal posture        Problem List Patient Active Problem List   Diagnosis Date Noted  . Controlled type 2 diabetes mellitus without complication ([place]) 67/89/3810  . Bradycardia 09/01/2018  . Gangrene of left foot ([place]) 05/28/2018  . 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 Obstructive sleep apnea syndrome 05/04/2018  . Obstructive sleep apnea treated with continuous positive airway pressure (CPAP) 05/04/2018  . Type II diabetes mellitus, uncontrolled ([place]) 04/22/2018  . Atherosclerosis of native arteries of the extremities with gangrene ([place]) 04/09/2018  . Atherosclerosis of native artery of extremity ([place]) 04/09/2018  . Cellulitis of right toe 04/04/2018  . Paroxysmal atrial fibrillation ([place]) 12/05/2017  . Cardiac pacemaker in situ   . Symptomatic bradycardia   . Asystole ([place])   . 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 History of aortic valve replacement 12/02/2017  . Severe aortic stenosis   . Bilateral impacted cerumen 01/22/2016  . Essential hypertension 11/14/2015  . PVD (peripheral vascular disease) ([place]) 05/24/2014  . Chronic kidney disease 08/30/2013  . Hypothyroidism 08/30/2013  . 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 Diabetes mellitus type 2, uncomplicated ([place])   . Aortic valve stenosis 07/25/2011  .  Hyperlipidemia 01/10/2011  . CAD (coronary artery disease) 01/10/2011  . Coronary arteriosclerosis 01/10/2011    Jamey Reas  PT, DPT 06/03/2019, 4:58 PM  [facility] [address] [place], [name], [address] Phone: [phone]   Fax:  [phone]  Name: Steven Maldonado MRN: [id number] Date of Birth: Dec 27, 1940

## 2019-06-04 ENCOUNTER — Other Ambulatory Visit (INDEPENDENT_AMBULATORY_CARE_PROVIDER_SITE_OTHER): Payer: Medicare Other

## 2019-06-04 ENCOUNTER — Telehealth: Payer: Self-pay

## 2019-06-04 ENCOUNTER — Other Ambulatory Visit: Payer: Self-pay

## 2019-06-04 DIAGNOSIS — Z794 Long term (current) use of insulin: Secondary | ICD-10-CM | POA: Diagnosis not present

## 2019-06-04 DIAGNOSIS — E1165 Type 2 diabetes mellitus with hyperglycemia: Secondary | ICD-10-CM

## 2019-06-04 LAB — HEMOGLOBIN A1C: Hgb A1c MFr Bld: 7.6 % — ABNORMAL HIGH (ref 4.6–6.5)

## 2019-06-04 MED ORDER — EVOLOCUMAB 140 MG/ML ~~LOC~~ SOSY: 140.0000 mg | PREFILLED_SYRINGE | SUBCUTANEOUS | 2 refills | Status: DC

## 2019-06-04 NOTE — Telephone Encounter (Signed)
Error

## 2019-06-05 ENCOUNTER — Other Ambulatory Visit: Payer: Self-pay | Admitting: Endocrinology

## 2019-06-08 ENCOUNTER — Encounter: Payer: Self-pay | Admitting: Endocrinology

## 2019-06-08 ENCOUNTER — Ambulatory Visit (INDEPENDENT_AMBULATORY_CARE_PROVIDER_SITE_OTHER): Payer: Medicare Other | Admitting: Endocrinology

## 2019-06-08 ENCOUNTER — Other Ambulatory Visit: Payer: Self-pay

## 2019-06-08 ENCOUNTER — Telehealth: Payer: Self-pay | Admitting: Family Medicine

## 2019-06-08 DIAGNOSIS — Z794 Long term (current) use of insulin: Secondary | ICD-10-CM | POA: Diagnosis not present

## 2019-06-08 DIAGNOSIS — E1165 Type 2 diabetes mellitus with hyperglycemia: Secondary | ICD-10-CM

## 2019-06-08 DIAGNOSIS — E039 Hypothyroidism, unspecified: Secondary | ICD-10-CM

## 2019-06-08 NOTE — Progress Notes (Addendum)
Patient ID: Steven Maldonado, male   DOB: [birth year], 78 y.o.   MRN: [id number]          Reason for Appointment: Follow-up for Type 2 Diabetes  Today's office visit was provided via telemedicine using a telephone call to the patient Patient has been explained the limitations of evaluation and management by telemedicine and the availability of in person appointments.  The patient understood the limitations and agreed to proceed. Patient also understood that the telehealth visit is billable. . Location of the patient: Home . Location of the provider: Office Only the patient and myself were participating in the encounter     History of Present Illness:          Date of diagnosis of type 2 diabetes mellitus:  1999      Background history:   He had been treated with metformin and glipizide for several years Metformin was stopped about 4 years ago because of renal dysfunction Presumably because of poor control he was started on insulin around the year 2009 He had been mostly on Lantus which was subsequently changed to [facility] and he thinks that Lantus worked better Also has been on Humalog 3-4 times a day for some time  Recent history:   Most recent A1c is 7.6, previously 5.9, has been as high as 11.6 in June 2019  INSULIN regimen is:  TRESIBA 25 at bedtime, Humalog, none  Non-insulin hypoglycemic drugs the patient is taking are: None  Current management, blood sugar patterns and problems identified:  He has been using the freestyle libre system since about February  His management, blood sugar patterns and problems identified are discussed in the CGM interpretation below  He has been able to exercise a little recently with starting to use his exercise bike and also some walking for 10 minutes  Not clear if his weight has come down at least judged by his recent office visits with inconsistent numbers        Side effects from medications have been: None     Meal times are:   Breakfast is at 8 AM usually              CONTINUOUS GLUCOSE MONITORING RECORD INTERPRETATION    Dates of Recording: 7/11 through 7/24  Sensor description:  Results statistics:   CGM use % of time  86  Average and SD  122  Time in range       97 %  % Time Above 180 2  % Time above 250   % Time Below target 1    Glycemic patterns summary: Blood sugar data is fairly complete with only occasional gaps He does have some variability in his blood sugars at various times but only rare hyperglycemic episodes On his lab glucose done on 7/20 his freestyle libre reading appeared to be about 40 mg lower than the actual reading However he has not checked his home fingerstick readings to compare Also his A1c indicates higher readings than his home average of 122 with the highest bi-hourly average only 142 at bedtime  Hyperglycemic episodes have been minimal and may occasionally occur late morning, late afternoon and rarely towards bedtime, likely postmeal with the highest blood sugar recorded only about 218 at night  Hypoglycemic episodes have not occurred  Overnight periods: Blood sugars are relatively stable with lowest readings around 2 AM-5 AM and may be low normal occasionally with some variability  Preprandial periods: Blood sugars are near normal breakfast and lunch  and may be slightly higher around dinnertime around 130  Postprandial periods:   Blood sugars are usually fairly even compared to preprandial readings with only occasional high sugars as discussed in the hyperglycemic section above BREAKFAST is usually a low carbohydrate shake, likely a protein shake  Only occasionally will have high readings after dinner but does not always have blood sugars documented. Again since his freestyle Elenor Legato may not be accurate he probably has high readings He thinks that most of his high readings are when he is eating out and not able to watch his diet especially with higher carbohydrate or  higher fat foods are dessert   PRE-MEAL Fasting Lunch Dinner Bedtime Overall  Glucose range:  109      Mean/median:     142  122   POST-MEAL PC Breakfast PC Lunch PC Dinner  Glucose range:     Mean/median:  122  134  139      Dietician visit, most recent: 9/19  Weight history:  Wt Readings from Last 3 Encounters:  05/31/19 243 lb (110.2 kg)  05/24/19 221 lb (100.2 kg)  05/20/19 229 lb (103.9 kg)    Glycemic control:   Lab Results  Component Value Date   HGBA1C 7.6 (H) 06/04/2019   HGBA1C 5.9 (A) 11/23/2018   HGBA1C 6.1 (A) 08/19/2018   Lab Results  Component Value Date   MICROALBUR 2.8 (H) 08/18/2018   LDLCALC 142 (H) 05/31/2019   CREATININE 1.55 (H) 05/31/2019   Lab Results  Component Value Date   MICRALBCREAT 2.2 08/18/2018    Lab Results  Component Value Date   FRUCTOSAMINE 268 06/26/2018    Lab on 06/04/2019  Component Date Value Ref Range Status  . Hgb A1c MFr Bld 06/04/2019 7.6* 4.6 - 6.5 % Final   Glycemic Control Guidelines for People with Diabetes:Non Diabetic:  <6%Goal of Therapy: <7%Additional Action Suggested:  >8%   Abstract on 06/03/2019  Component Date Value Ref Range Status  . HM Diabetic Eye Exam 06/02/2019 Retinopathy* No Retinopathy Final    Allergies as of 06/08/2019   No Known Allergies     Medication List       Accurate as of June 08, 2019 11:59 PM. If you have any questions, ask your nurse or doctor.        amiodarone 200 MG tablet Commonly known as: PACERONE TAKE 1 TABLET BY MOUTH EVERY DAY   atorvastatin 80 MG tablet Commonly known as: LIPITOR Take 1 tablet (80 mg total) by mouth at bedtime.   docusate sodium 100 MG capsule Commonly known as: COLACE Take 100 mg by mouth 2 (two) times daily.   Evolocumab 140 MG/ML Sosy Inject 140 mg into the skin every 14 (fourteen) days.   Fenofibric Acid 135 MG Cpdr TAKE 1 CAPSULE DAILY   FreeStyle Lite Devi 1 each by Does not apply route 2 (two) times daily.    furosemide 20 MG tablet Commonly known as: LASIX Take 1 tablet (20 mg total) by mouth daily. What changed: when to take this   glipiZIDE 10 MG 24 hr tablet Commonly known as: GLUCOTROL XL TAKE 1 TABLET (10 MG TOTAL) BY MOUTH DAILY WITH BREAKFAST.   glucose blood test strip Commonly known as: FREESTYLE LITE 1 each by Other route 4 (four) times daily -  before meals and at bedtime.   HumaLOG KwikPen 100 UNIT/ML KwikPen Generic drug: insulin lispro Inject 5 Units into the skin 3 (three) times daily. Inject 5 units under the skin  three times daily before meals.   Insulin Pen Needle 32G X 4 MM Misc Commonly known as: BD Pen Needle Nano U/F USE 4 (FOUR) TIMES DAILY.   levothyroxine 175 MCG tablet Commonly known as: SYNTHROID TAKE 1 TABLET BY MOUTH EVERY DAY   metoprolol tartrate 50 MG tablet Commonly known as: LOPRESSOR TAKE ONE AND ONE-HALF TABLET BY MOUTH TWICE A DAY   mirabegron ER 25 MG Tb24 tablet Commonly known as: Myrbetriq Take 1 tablet (25 mg total) by mouth daily.   pentoxifylline 400 MG CR tablet Commonly known as: TRENTAL Take 1 tablet (400 mg total) by mouth 3 (three) times daily with meals.   Pradaxa 150 MG Caps capsule Generic drug: dabigatran Take 150 mg by mouth every 12 (twelve) hours.   silver sulfADIAZINE 1 % cream Commonly known as: Silvadene Apply 1 application topically daily.   [name] FlexTouch 200 UNIT/ML Sopn Generic drug: Insulin Degludec Inject 25 Units into the skin daily. Inject 25 units under the skin once daily.       Allergies: No Known Allergies  Past Medical History:  Diagnosis Date  . Chronic diastolic CHF (congestive heart failure) ([place])   . CKD (chronic kidney disease), stage III ([place])   . Constipation   . Coronary artery disease    a. Cath February 2012 in [place] [facility], occluded RCA with collaterals  . DM type 2 (diabetes mellitus, type 2) ([place])   . Essential hypertension   . Hyperlipidemia   . Neuropathy    feet  .  Pacemaker    a. symptomatic brady after TAVR s/p MDT PPM by Dr. Curt Bears 12/04/17  . Persistent atrial fibrillation   . PONV (postoperative nausea and vomiting)    after valve surgery  . PVD (peripheral vascular disease) ([place])    a. s/p R popliteal artery stenosis tx with drug-coated balloon 05/2014, followed by Dr. Fletcher Anon.  . 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 Severe aortic stenosis    a. 12/02/17: s/p TAVR  . Skin cancer   . Sleep apnea with use of continuous positive airway pressure (CPAP)    04-11-11 AHI was 32.9 and titrated to 15 cm H20, DME is [facility]  . Subclinical hypothyroidism     Past Surgical History:  Procedure Laterality Date  . ABDOMINAL ANGIOGRAM N/A 06/08/2014   Procedure: ABDOMINAL ANGIOGRAM;  Surgeon: Wellington Hampshire, MD;  Location: [facility] CATH LAB;  Service: Cardiovascular;  Laterality: N/A;  . ABDOMINAL AORTOGRAM N/A 04/09/2018   Procedure: ABDOMINAL AORTOGRAM;  Surgeon: Conrad Irwin, MD;  Location: [place] CV LAB;  Service: Cardiovascular;  Laterality: N/A;  . AMPUTATION Left 04/17/2018   Procedure: LEFT FOOT 3RD RAY AMPUTATION;  Surgeon: Newt Minion, MD;  Location: [place];  Service: Orthopedics;  Laterality: Left;  . AMPUTATION Left 05/28/2018   Procedure: LEFT AMPUTATION BELOW KNEE;  Surgeon: Wylene Simmer, MD;  Location: [place];  Service: Orthopedics;  Laterality: Left;  . APPENDECTOMY  1965  . 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 CARDIAC CATHETERIZATION  12/2010  . CARDIOVERSION  07/2011  . CARDIOVERSION N/A 04/18/2014   Procedure: CARDIOVERSION;  Surgeon: Dorothy Spark, MD;  Location: [facility] ENDOSCOPY;  Service: Cardiovascular;  Laterality: N/A;  . CARDIOVERSION N/A 11/03/2015   Procedure: CARDIOVERSION;  Surgeon: Lelon Perla, MD;  Location: [place];  Service: Cardiovascular;  Laterality: N/A;  . CARDIOVERSION N/A  05/08/2017    Procedure: CARDIOVERSION;  Surgeon: Dorothy Spark, MD;  Location: [facility] ENDOSCOPY;  Service: Cardiovascular;  Laterality: N/A;  . CARDIOVERSION N/A 07/28/2017   Procedure: CARDIOVERSION;  Surgeon: Dorothy Spark, MD;  Location: [place];  Service: Cardiovascular;  Laterality: N/A;  . LOWER EXTREMITY ANGIOGRAM N/A 06/08/2014   Procedure: LOWER EXTREMITY ANGIOGRAM;  Surgeon: Wellington Hampshire, MD;  Location: [place] CATH LAB;  Service: Cardiovascular;  Laterality: N/A;  . LOWER EXTREMITY ANGIOGRAPHY Left 04/09/2018   Procedure: Lower Extremity Angiography;  Surgeon: Conrad Spencer, MD;  Location: [place] CV LAB;  Service: Cardiovascular;  Laterality: Left;  . PACEMAKER IMPLANT N/A 12/04/2017   Procedure: PACEMAKER IMPLANT;  Surgeon: Constance Haw, MD;  Location: [place] CV LAB;  Service: Cardiovascular;  Laterality: N/A;  . PERIPHERAL VASCULAR BALLOON ANGIOPLASTY Left 04/09/2018   Procedure: PERIPHERAL VASCULAR BALLOON ANGIOPLASTY;  Surgeon: Conrad [place], MD;  Location: [place] CV LAB;  Service: Cardiovascular;  Laterality: Left;  SFA  . POPLITEAL ARTERY ANGIOPLASTY Right 06/08/2014   Archie Endo 06/08/2014  . RIGHT/LEFT HEART CATH AND CORONARY ANGIOGRAPHY N/A 10/08/2017   Procedure: RIGHT/LEFT HEART CATH AND CORONARY ANGIOGRAPHY;  Surgeon: Burnell Blanks, MD;  Location: [place] CV LAB;  Service: Cardiovascular;  Laterality: N/A;  . SKIN CANCER EXCISION Bilateral    "have had them cut off back of neck X 2; off left upper arm; right wrist, near right shoulder blade" (06/08/2014)  . TEE WITHOUT CARDIOVERSION N/A 12/02/2017   Procedure: TRANSESOPHAGEAL ECHOCARDIOGRAM (TEE);  Surgeon: Burnell Blanks, MD;  Location: [place];  Service: Open Heart Surgery;  Laterality: N/A;  . TEMPORARY PACEMAKER N/A 12/04/2017   Procedure: TEMPORARY PACEMAKER;  Surgeon: Leonie Man, MD;  Location: [place] CV LAB;  Service: Cardiovascular;  Laterality: N/A;  . TRANSCATHETER AORTIC  VALVE REPLACEMENT, TRANSFEMORAL N/A 12/02/2017   Procedure: TRANSCATHETER AORTIC VALVE REPLACEMENT, TRANSFEMORAL;  Surgeon: Burnell Blanks, MD;  Location: [place];  Service: Open Heart Surgery;  Laterality: N/A;  using Edwards Sapien 3 Transcatheter Heart Valve size 69mm    Family History  Problem Relation Age of Onset  . Diabetes Mother   . Heart attack Mother   . Hypertension Mother   . Heart attack Father   . Heart failure Father   . Hypertension Father   . Diabetes Father   . Diabetes Sister   . Diabetes Brother   . Diabetes Other   . Diabetes Daughter        TYPE ll  . Heart Problems Daughter   . Hypertension Sister   . Hypertension Brother   . Stroke Brother     Social History:  reports that he quit smoking about 46 years ago. His smoking use included cigarettes. He has a 28.00 pack-year smoking history. He has never used smokeless tobacco. He reports that he does not drink alcohol or use drugs.   Review of Systems   Lipid history: He is taking 80 mg atorvastatin without adequate control, high-dose with history of CAD This is being addressed by his PCP    Lab Results  Component Value Date   CHOL 219 (H) 05/31/2019   CHOL 131 08/18/2018   CHOL 155 10/13/2017   Lab Results  Component Value Date   HDL 36 (L) 05/31/2019   HDL 30.30 (L) 08/18/2018   HDL 38 (L) 10/13/2017   Lab Results  Component Value Date   LDLCALC 142 (H) 05/31/2019   [place] 93 10/13/2017  [place] 95 05/01/2017   Lab Results  Component Value Date   TRIG 264 (H) 05/31/2019   TRIG 219.0 (H) 08/18/2018   TRIG 144 10/13/2017   Lab Results  Component Value Date   CHOLHDL 6.1 (H) 05/31/2019   CHOLHDL 4 08/18/2018   CHOLHDL 4.1 10/13/2017   Lab Results  Component Value Date   LDLDIRECT 77.0 08/18/2018            Hypertension: Has been controlled with only antihypertensive being metoprolol, not on ACE inhibitor  BP Readings from Last 3 Encounters:  05/31/19 124/62  05/24/19  (!) 138/92  05/20/19 132/74    Most recent eye exam was in 12/18 ?  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         ASSESSMENT:  Diabetes type 2, insulin requiring with moderate obesity  See history of present illness for detailed discussion of current diabetes management, blood sugar patterns and problems identified  A1c is 7.6  Although his A1c is reasonable considering his age and comorbid conditions it is going up significantly He is likely having some tendency to high postprandial readings when he is not doing well on his diet Although most of the time he is trying to eat healthy at home he is likely eating out more He is also having falsely low readings from his freestyle libre sensor  Discussed need to check his blood sugars on his fingerstick periodically especially after meals to help identify the accuracy of his freestyle libre and the differences He will probably need to take 4-5 units of Humalog whenever he is eating out or eating large meals like pizza or fried food or dessert Encouraged him to start being more active also For now we will continue his [place] unchanged unless his morning sugars are consistently  high  CKD: Stable, to follow-up with nephrologist as scheduled   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: Last TSH was normal and will need to be checked again on the next visit  Hyperlipidemia: LDL is unusually high and may be from his not taking his Lipitor regularly Apparently he is being considered for Repatha   PLAN:    Discussed need to periodically take 4 to 6 units of Humalog when he is planning to eat dessert or higher fat meals especially eating out and showed him where his blood sugars are going up  This may be at any meal during the day  However do not think that he needs to take extra insulin postprandially if the blood sugar is around 200 especially late in the evening  Also discussed that if he continues to have hypoglycemia overnight will need to reduce his insulin  Need to follow-up  on his kidney function  Encouraged him to be as active as possible  To make sure he checks his blood sugar at least 3 times a day with his freestyle libre to allow for him interpretation  No change in [place] as yet  There are no Patient Instructions on file for this visit.   Total duration of phone call = 12 minutes   Elayne Snare 06/09/2019, 10:49 AM   Note: This office note was prepared with Dragon voice recognition system technology. Any transcriptional errors that result from this process are unintentional.

## 2019-06-08 NOTE — Telephone Encounter (Signed)
PA Submitted through [email or web address] and received the following:  Your information has been submitted to [employer]. To check for an updated outcome later, reopen this PA request from your dashboard.  If [employer] has not responded to your request within 24 hours, contact [employer] at [phone]. If you think there may be a problem with your PA request, use our live chat feature at the bottom right.

## 2019-06-09 ENCOUNTER — Encounter: Payer: Self-pay | Admitting: Physical Therapy

## 2019-06-09 ENCOUNTER — Ambulatory Visit: Payer: Medicare Other | Admitting: Physical Therapy

## 2019-06-09 ENCOUNTER — Other Ambulatory Visit: Payer: Self-pay

## 2019-06-09 DIAGNOSIS — R2689 Other abnormalities of gait and mobility: Secondary | ICD-10-CM | POA: Diagnosis not present

## 2019-06-09 DIAGNOSIS — R2681 Unsteadiness on feet: Secondary | ICD-10-CM | POA: Diagnosis not present

## 2019-06-09 DIAGNOSIS — M6281 Muscle weakness (generalized): Secondary | ICD-10-CM | POA: Diagnosis not present

## 2019-06-09 DIAGNOSIS — R293 Abnormal posture: Secondary | ICD-10-CM | POA: Diagnosis not present

## 2019-06-09 DIAGNOSIS — Z9181 History of falling: Secondary | ICD-10-CM

## 2019-06-09 DIAGNOSIS — M6249 Contracture of muscle, multiple sites: Secondary | ICD-10-CM | POA: Diagnosis not present

## 2019-06-09 NOTE — Therapy (Signed)
[facility] [address] [place], [name], [address] Phone: [phone]   Fax:  [phone]  Physical Therapy Treatment  Patient Details  Name: Steven Maldonado MRN: [id number] Date of Birth: 02-10-1941 Referring Provider (PT): Mechele Claude, [place]   Encounter Date: 06/09/2019   CLINIC OPERATION CHANGES: [facility] is open at lower capacity following universal masking, social distancing, and patient screening.  The patient's COVID risk of complications score is 7.   PT End of Session - 06/09/19 1647    Visit Number  47    Number of Visits  53    Date for PT Re-Evaluation  07/09/19    Authorization Type  Medicare & Federal BCBS    Authorization Time Period  Medicare guidelines, Fed BCBS waves co-pay.  75 visit limit PT, OT & ST combined with 1 used.    Authorization - Visit Number  27    Authorization - Number of Visits  75    PT Start Time  1600    PT Stop Time  6226    PT Time Calculation (min)  45 min    Equipment Utilized During Treatment  Gait belt    Activity Tolerance  Patient tolerated treatment well;No increased pain;Patient limited by fatigue    Behavior During Therapy  [facility] for tasks assessed/performed       Past Medical History:  Diagnosis Date  . Chronic diastolic CHF (congestive heart failure) ([place])   . CKD (chronic kidney disease), stage III ([place])   . Constipation   . Coronary artery disease    a. Cath February 2012 in [facility], occluded RCA with collaterals  . DM type 2 (diabetes mellitus, type 2) ([place])   . Essential hypertension   . Hyperlipidemia   . Neuropathy    feet  . Pacemaker    a. symptomatic brady after TAVR s/p MDT PPM by Dr. Curt Bears 12/04/17  . Persistent atrial fibrillation   . PONV (postoperative nausea and vomiting)    after valve surgery  . PVD (peripheral vascular disease) ([place])    a. s/p R popliteal artery stenosis tx with drug-coated balloon 05/2014, followed by Dr.  Fletcher Anon.  . 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 Severe aortic stenosis    a. 12/02/17: s/p TAVR  . Skin cancer   . Sleep apnea with use of continuous positive airway pressure (CPAP)    04-11-11 AHI was 32.9 and titrated to 15 cm H20, DME is [facility]  . Subclinical hypothyroidism     Past Surgical History:  Procedure Laterality Date  . ABDOMINAL ANGIOGRAM N/A 06/08/2014   Procedure: ABDOMINAL ANGIOGRAM;  Surgeon: Wellington Hampshire, MD;  Location: [facility] CATH LAB;  Service: Cardiovascular;  Laterality: N/A;  . ABDOMINAL AORTOGRAM N/A 04/09/2018   Procedure: ABDOMINAL AORTOGRAM;  Surgeon: Conrad Riceville, MD;  Location: [facility] CV LAB;  Service: Cardiovascular;  Laterality: N/A;  . AMPUTATION Left 04/17/2018   Procedure: LEFT FOOT 3RD RAY AMPUTATION;  Surgeon: Newt Minion, MD;  Location: [facility];  Service: Orthopedics;  Laterality: Left;  . AMPUTATION Left 05/28/2018   Procedure: LEFT AMPUTATION BELOW KNEE;  Surgeon: Wylene Simmer, MD;  Location: [facility];  Service: Orthopedics;  Laterality: Left;  . APPENDECTOMY  1965  . 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 CARDIAC CATHETERIZATION  12/2010  . CARDIOVERSION  07/2011  .  CARDIOVERSION N/A 04/18/2014   Procedure: CARDIOVERSION;  Surgeon: Dorothy Spark, MD;  Location: [facility];  Service: Cardiovascular;  Laterality: N/A;  . CARDIOVERSION N/A 11/03/2015   Procedure: CARDIOVERSION;  Surgeon: Lelon Perla, MD;  Location: [facility] ENDOSCOPY;  Service: Cardiovascular;  Laterality: N/A;  . CARDIOVERSION N/A 05/08/2017   Procedure: CARDIOVERSION;  Surgeon: Dorothy Spark, MD;  Location: [facility] ENDOSCOPY;  Service: Cardiovascular;  Laterality: N/A;  . CARDIOVERSION N/A 07/28/2017   Procedure: CARDIOVERSION;  Surgeon: Dorothy Spark, MD;  Location: [facility];  Service: Cardiovascular;  Laterality: N/A;  . LOWER EXTREMITY  ANGIOGRAM N/A 06/08/2014   Procedure: LOWER EXTREMITY ANGIOGRAM;  Surgeon: Wellington Hampshire, MD;  Location: [facility] CATH LAB;  Service: Cardiovascular;  Laterality: N/A;  . LOWER EXTREMITY ANGIOGRAPHY Left 04/09/2018   Procedure: Lower Extremity Angiography;  Surgeon: Conrad Fraser, MD;  Location: [facility] CV LAB;  Service: Cardiovascular;  Laterality: Left;  . PACEMAKER IMPLANT N/A 12/04/2017   Procedure: PACEMAKER IMPLANT;  Surgeon: Constance Haw, MD;  Location: [facility] CV LAB;  Service: Cardiovascular;  Laterality: N/A;  . PERIPHERAL VASCULAR BALLOON ANGIOPLASTY Left 04/09/2018   Procedure: PERIPHERAL VASCULAR BALLOON ANGIOPLASTY;  Surgeon: Conrad Holtville, MD;  Location: [facility] CV LAB;  Service: Cardiovascular;  Laterality: Left;  SFA  . POPLITEAL ARTERY ANGIOPLASTY Right 06/08/2014   Archie Endo 06/08/2014  . RIGHT/LEFT HEART CATH AND CORONARY ANGIOGRAPHY N/A 10/08/2017   Procedure: RIGHT/LEFT HEART CATH AND CORONARY ANGIOGRAPHY;  Surgeon: Burnell Blanks, MD;  Location: [facility] CV LAB;  Service: Cardiovascular;  Laterality: N/A;  . SKIN CANCER EXCISION Bilateral    "have had them cut off back of neck X 2; off left upper arm; right wrist, near right shoulder blade" (06/08/2014)  . TEE WITHOUT CARDIOVERSION N/A 12/02/2017   Procedure: TRANSESOPHAGEAL ECHOCARDIOGRAM (TEE);  Surgeon: Burnell Blanks, MD;  Location: [facility];  Service: Open Heart Surgery;  Laterality: N/A;  . TEMPORARY PACEMAKER N/A 12/04/2017   Procedure: TEMPORARY PACEMAKER;  Surgeon: Leonie Man, MD;  Location: [facility] CV LAB;  Service: Cardiovascular;  Laterality: N/A;  . TRANSCATHETER AORTIC VALVE REPLACEMENT, TRANSFEMORAL N/A 12/02/2017   Procedure: TRANSCATHETER AORTIC VALVE REPLACEMENT, TRANSFEMORAL;  Surgeon: Burnell Blanks, MD;  Location: [facility];  Service: Open Heart Surgery;  Laterality: N/A;  using Edwards Sapien 3 Transcatheter Heart Valve size 34mm    There were no vitals filed for  this visit.  Subjective Assessment - 06/09/19 1552    Subjective  He has been using being going in/out his sun room including carrying cup & closing door as PT. It has been doing well. Foot doctor released him to resume his walking.    Pertinent History  L TTA, CAD, PAF,  pacemaker, DM2, neuropathy, CKD, HTN,     Limitations  Lifting;Standing;Walking;House hold activities    Patient Stated Goals  To use prosthesis in community, travel (uses Lucianne Lei), take of himself so wife does not have to do it. Housework.     Currently in Pain?  No/denies                       [facility] Adult PT Treatment/Exercise - 06/09/19 1555      Transfers   Transfers  Sit to Stand;Stand to Sit    Sit to Stand  5: Supervision;With upper extremity assist;With armrests;From chair/3-in-1   to cane   Sit to Stand Details (indicate cue type and reason)  worked on chairs without  armrests using UEs to cane. Demo & verbal cues on technique    Stand to Sit  5: Supervision;With upper extremity assist;With armrests;To chair/3-in-1   from cane   Stand to Sit Details  worked on chairs without armrests using UEs to cane. Demo & verbal cues on technique      Ambulation/Gait   Ambulation/Gait  Yes    Ambulation/Gait Assistance  5: Supervision    Ambulation/Gait Assistance Details  worked on carrying plate of objects around furniture and worked on scanning while ambulating.     Ambulation Distance (Feet)  150 Feet   150' X 2   Assistive device  Prosthesis;Straight cane   cane quad tip   Gait Pattern  Step-through pattern;Decreased arm swing - left;Decreased step length - right;Decreased stance time - left;Decreased weight shift to left;Antalgic;Lateral hip instability;Abducted - left;Trunk flexed    Ambulation Surface  Indoor;Level;Outdoor;Paved    Stairs  Yes    Stairs Assistance  5: Supervision    Stair Management Technique  One rail Left;With cane;Step to pattern;Forwards    Number of Stairs  4    Door Management   5: Supervision    Ramp  4: Min assist   cane qud tip & prosthesis   Ramp Details (indicate cue type and reason)  demo, tactile & verbal cues on technique with TTA prosthesis & cane    Curb  4: Min assist   cane quad tip & TTA prosthesis   Curb Details (indicate cue type and reason)  demo, tactile & verbal cues on technique with TTA prosthesis & cane      Prosthetics   Current prosthetic wear tolerance (days/week)   daily     Current prosthetic wear tolerance (#hours/day)   most of awake hours    Residual limb condition   intact per pt report               PT Short Term Goals - 05/19/19 2252      PT SHORT TERM GOAL #1   Title  Patient verbalizes proper adjustment of ply sock fit with volume fluctuations. (All updated STGs Target Date 06/11/2019)    Time  4    Period  Weeks    Status  Revised    Target Date  06/11/19      PT SHORT TERM GOAL #2   Title  Patient verbalizes & demonstrates understanding of OTAGO HEP.    Time  4    Period  Weeks    Status  Revised    Target Date  06/11/19      PT SHORT TERM GOAL #3   Title  Patient negotiates ramps & curbs with straight cane quad tip & prosthesis with supervision    Time  4    Period  Weeks    Status  Revised    Target Date  06/11/19      PT SHORT TERM GOAL #4   Title  Patient ambulates 150' with straight cane quad tip with supervision.    Time  4    Period  Weeks    Status  Revised    Target Date  06/11/19        PT Long Term Goals - 04/20/19 0755      PT LONG TERM GOAL #1   Title  Patient verbalizes & demonstrates proper prosthetic care including donning / doffing & wear daily for >90% of awake hours without skin or limb pain issues independently to enable safe use of  prosthesis (All LTGs Target Date: 07/09/2019)    Time  3    Period  Months    Status  On-going      PT LONG TERM GOAL #2   Title  Patient demonstrates & verbalizes understanding of ongoing HEP / fitness plan.     Time  3    Period  Months     Status  On-going      PT LONG TERM GOAL #3   Title  Berg Balance >/= 47/56 to indicate lower fall risk & less dependency in standing ADLs.     Time  3    Period  Months    Status  On-going      PT LONG TERM GOAL #4   Title  Patient ambulates 250' with straight cane quad tip & prosthesis modified independent.     Time  3    Period  Months    Status  On-going      PT LONG TERM GOAL #5   Title  Patient negotiates ramps, curbs with straight cane quad tip & stairs 1 rail /cane modified independent for community access.     Time  3    Period  Months    Status  On-going      PT LONG TERM GOAL #6   Title  Dynamic Gait Index with straight cane quad tip & prosthesis >12/24.     Time  3    Period  Months    Status  On-going      PT LONG TERM GOAL #7   Title  Gait Velocity with straight cane quad tip & prosthesis >1.8 ft/sec    Time  3    Period  Months    Status  On-going            Plan - 06/09/19 2301    Clinical Impression Statement  Today's session focused on carrying plate & scanning while ambulating with cane quad tip. PT also worked on negotiating ramps & curbs with cane quad tip.  Sit Sarajane Jews chairs without armrests using UEs    Rehab Potential  Good    PT Frequency  1x / week    PT Duration  12 weeks    PT Treatment/Interventions  ADLs/Self Care Home Management;Canalith Repostioning;DME Instruction;Gait training;Stair training;Functional mobility training;Therapeutic activities;Therapeutic exercise;Balance training;Neuromuscular re-education;Patient/family education;Prosthetic Training;Manual techniques;Vestibular    PT Next Visit Plan  work towards updated STGs- gait/barriers with cane/prosthesis, balance reactions.    Consulted and Agree with Plan of Care  Patient       Patient will benefit from skilled therapeutic intervention in order to improve the following deficits and impairments:  Abnormal gait, Decreased activity tolerance, Decreased balance, Decreased  endurance, Decreased knowledge of use of DME, Decreased mobility, Impaired flexibility, Decreased range of motion, Decreased strength, Decreased skin integrity, Dizziness, Postural dysfunction, Prosthetic Dependency  Visit Diagnosis: 1. Muscle weakness (generalized)   2. Other abnormalities of gait and mobility   3. Unsteadiness on feet   4. Abnormal posture   5. Contracture of muscle, multiple sites   6. History of falling        Problem List Patient Active Problem List   Diagnosis Date Noted  . Controlled type 2 diabetes mellitus without complication ([place]) 48/54/6270  . Bradycardia 09/01/2018  . Gangrene of left foot ([place]) 05/28/2018  . 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 Obstructive sleep apnea syndrome 05/04/2018  . Obstructive sleep apnea treated with  continuous positive airway pressure (CPAP) 05/04/2018  . Type II diabetes mellitus, uncontrolled ([place]) 04/22/2018  . Atherosclerosis of native arteries of the extremities with gangrene ([place]) 04/09/2018  . Atherosclerosis of native artery of extremity ([place]) 04/09/2018  . Cellulitis of right toe 04/04/2018  . Paroxysmal atrial fibrillation ([place]) 12/05/2017  . Cardiac pacemaker in situ   . Symptomatic bradycardia   . Asystole ([place])   . 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 History of aortic valve replacement 12/02/2017  . Severe aortic stenosis   . Bilateral impacted cerumen 01/22/2016  . Essential hypertension 11/14/2015  . PVD (peripheral vascular disease) ([place]) 05/24/2014  . Chronic kidney disease 08/30/2013  . Hypothyroidism 08/30/2013  . 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 Diabetes mellitus type 2, uncomplicated ([place])   . Aortic valve stenosis 07/25/2011  . Hyperlipidemia 01/10/2011  . CAD (coronary artery disease) 01/10/2011  . Coronary arteriosclerosis 01/10/2011    Jamey Reas PT, DPT 06/09/2019, 11:03 PM  [facility] [address] [place], [name], [address] Phone: [phone]   Fax:  [phone]  Name: Steven Maldonado MRN: [id number] Date of Birth: 1941-10-30

## 2019-06-16 ENCOUNTER — Ambulatory Visit: Payer: Medicare Other | Attending: Student | Admitting: Physical Therapy

## 2019-06-16 DIAGNOSIS — M6249 Contracture of muscle, multiple sites: Secondary | ICD-10-CM | POA: Insufficient documentation

## 2019-06-16 DIAGNOSIS — R2681 Unsteadiness on feet: Secondary | ICD-10-CM | POA: Insufficient documentation

## 2019-06-16 DIAGNOSIS — R293 Abnormal posture: Secondary | ICD-10-CM | POA: Insufficient documentation

## 2019-06-16 DIAGNOSIS — M6281 Muscle weakness (generalized): Secondary | ICD-10-CM | POA: Insufficient documentation

## 2019-06-16 DIAGNOSIS — R2689 Other abnormalities of gait and mobility: Secondary | ICD-10-CM | POA: Insufficient documentation

## 2019-06-16 MED ORDER — DABIGATRAN ETEXILATE MESYLATE 150 MG PO CAPS: 150.0000 mg | ORAL_CAPSULE | Freq: Two times a day (BID) | ORAL | 3 refills | Status: DC

## 2019-06-23 ENCOUNTER — Encounter: Payer: Self-pay | Admitting: Physical Therapy

## 2019-06-23 ENCOUNTER — Other Ambulatory Visit: Payer: Self-pay

## 2019-06-23 ENCOUNTER — Ambulatory Visit: Payer: Medicare Other | Admitting: Physical Therapy

## 2019-06-23 DIAGNOSIS — R293 Abnormal posture: Secondary | ICD-10-CM

## 2019-06-23 DIAGNOSIS — R2689 Other abnormalities of gait and mobility: Secondary | ICD-10-CM | POA: Diagnosis not present

## 2019-06-23 DIAGNOSIS — M6281 Muscle weakness (generalized): Secondary | ICD-10-CM

## 2019-06-23 DIAGNOSIS — R2681 Unsteadiness on feet: Secondary | ICD-10-CM | POA: Diagnosis not present

## 2019-06-23 DIAGNOSIS — M6249 Contracture of muscle, multiple sites: Secondary | ICD-10-CM | POA: Diagnosis not present

## 2019-06-24 NOTE — Therapy (Signed)
[facility] [address] [place], [name], [address] Phone: [phone]   Fax:  [phone]  Physical Therapy Treatment  Patient Details  Name: Steven Maldonado MRN: [id number] Date of Birth: Apr 06, 1941 Referring Provider (PT): Mechele Claude, [place]   Encounter Date: 06/23/2019   CLINIC OPERATION CHANGES: [facility] is open at lower capacity following universal masking, social distancing, and patient screening.  The patient's COVID risk of complications score is 7.   PT End of Session - 06/24/19 0757    Visit Number  48    Number of Visits  53    Date for PT Re-Evaluation  07/09/19    Authorization Type  Medicare & Federal BCBS    Authorization Time Period  Medicare guidelines, Fed BCBS waves co-pay.  75 visit limit PT, OT & ST combined with 1 used.    Authorization - Visit Number  28    Authorization - Number of Visits  75    PT Start Time  0177    PT Stop Time  1615    PT Time Calculation (min)  45 min    Equipment Utilized During Treatment  Gait belt    Activity Tolerance  Patient tolerated treatment well;No increased pain;Patient limited by fatigue    Behavior During Therapy  [facility] for tasks assessed/performed       Past Medical History:  Diagnosis Date  . Chronic diastolic CHF (congestive heart failure) ([place])   . CKD (chronic kidney disease), stage III ([place])   . Constipation   . Coronary artery disease    a. Cath February 2012 in [facility], occluded RCA with collaterals  . DM type 2 (diabetes mellitus, type 2) ([place])   . Essential hypertension   . Hyperlipidemia   . Neuropathy    feet  . Pacemaker    a. symptomatic brady after TAVR s/p MDT PPM by Dr. Curt Bears 12/04/17  . Persistent atrial fibrillation   . PONV (postoperative nausea and vomiting)    after valve surgery  . PVD (peripheral vascular disease) ([place])    a. s/p R popliteal artery stenosis tx with drug-coated balloon 05/2014, followed by Dr.  Fletcher Anon.  . 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 Severe aortic stenosis    a. 12/02/17: s/p TAVR  . Skin cancer   . Sleep apnea with use of continuous positive airway pressure (CPAP)    04-11-11 AHI was 32.9 and titrated to 15 cm H20, DME is [facility]  . Subclinical hypothyroidism     Past Surgical History:  Procedure Laterality Date  . ABDOMINAL ANGIOGRAM N/A 06/08/2014   Procedure: ABDOMINAL ANGIOGRAM;  Surgeon: Wellington Hampshire, MD;  Location: [facility] CATH LAB;  Service: Cardiovascular;  Laterality: N/A;  . ABDOMINAL AORTOGRAM N/A 04/09/2018   Procedure: ABDOMINAL AORTOGRAM;  Surgeon: Conrad Bylas, MD;  Location: [place] CV LAB;  Service: Cardiovascular;  Laterality: N/A;  . AMPUTATION Left 04/17/2018   Procedure: LEFT FOOT 3RD RAY AMPUTATION;  Surgeon: Newt Minion, MD;  Location: [place];  Service: Orthopedics;  Laterality: Left;  . AMPUTATION Left 05/28/2018   Procedure: LEFT AMPUTATION BELOW KNEE;  Surgeon: Wylene Simmer, MD;  Location: [place];  Service: Orthopedics;  Laterality: Left;  . APPENDECTOMY  1965  . 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 CARDIAC CATHETERIZATION  12/2010  . CARDIOVERSION  07/2011  .  CARDIOVERSION N/A 04/18/2014   Procedure: CARDIOVERSION;  Surgeon: Dorothy Spark, MD;  Location: [place];  Service: Cardiovascular;  Laterality: N/A;  . CARDIOVERSION N/A 11/03/2015   Procedure: CARDIOVERSION;  Surgeon: Lelon Perla, MD;  Location[facility] ENDOSCOPY;  Service: Cardiovascular;  Laterality: N/A;  . CARDIOVERSION N/A 05/08/2017   Procedure: CARDIOVERSION;  Surgeon: Dorothy Spark, MD;  Location: [facility] ENDOSCOPY;  Service: Cardiovascular;  Laterality: N/A;  . CARDIOVERSION N/A 07/28/2017   Procedure: CARDIOVERSION;  Surgeon: Dorothy Spark, MD;  Location: [place];  Service: Cardiovascular;  Laterality: N/A;  . LOWER EXTREMITY  ANGIOGRAM N/A 06/08/2014   Procedure: LOWER EXTREMITY ANGIOGRAM;  Surgeon: Wellington Hampshire, MD;  Location: [facility] CATH LAB;  Service: Cardiovascular;  Laterality: N/A;  . LOWER EXTREMITY ANGIOGRAPHY Left 04/09/2018   Procedure: Lower Extremity Angiography;  Surgeon: Conrad [place], MD;  Location: [place] CV LAB;  Service: Cardiovascular;  Laterality: Left;  . PACEMAKER IMPLANT N/A 12/04/2017   Procedure: PACEMAKER IMPLANT;  Surgeon: Constance Haw, MD;  Location: [place] CV LAB;  Service: Cardiovascular;  Laterality: N/A;  . PERIPHERAL VASCULAR BALLOON ANGIOPLASTY Left 04/09/2018   Procedure: PERIPHERAL VASCULAR BALLOON ANGIOPLASTY;  Surgeon: Conrad Cayuco, MD;  Location: [place] CV LAB;  Service: Cardiovascular;  Laterality: Left;  SFA  . POPLITEAL ARTERY ANGIOPLASTY Right 06/08/2014   Archie Endo 06/08/2014  . RIGHT/LEFT HEART CATH AND CORONARY ANGIOGRAPHY N/A 10/08/2017   Procedure: RIGHT/LEFT HEART CATH AND CORONARY ANGIOGRAPHY;  Surgeon: Burnell Blanks, MD;  Location: [place] CV LAB;  Service: Cardiovascular;  Laterality: N/A;  . SKIN CANCER EXCISION Bilateral    "have had them cut off back of neck X 2; off left upper arm; right wrist, near right shoulder blade" (06/08/2014)  . TEE WITHOUT CARDIOVERSION N/A 12/02/2017   Procedure: TRANSESOPHAGEAL ECHOCARDIOGRAM (TEE);  Surgeon: Burnell Blanks, MD;  Location: [place];  Service: Open Heart Surgery;  Laterality: N/A;  . TEMPORARY PACEMAKER N/A 12/04/2017   Procedure: TEMPORARY PACEMAKER;  Surgeon: Leonie Man, MD;  Location: [place] CV LAB;  Service: Cardiovascular;  Laterality: N/A;  . TRANSCATHETER AORTIC VALVE REPLACEMENT, TRANSFEMORAL N/A 12/02/2017   Procedure: TRANSCATHETER AORTIC VALVE REPLACEMENT, TRANSFEMORAL;  Surgeon: Burnell Blanks, MD;  Location: [place];  Service: Open Heart Surgery;  Laterality: N/A;  using Edwards Sapien 3 Transcatheter Heart Valve size 40mm    There were no vitals filed for  this visit.  Subjective Assessment - 06/23/19 1534    Subjective  No falls. Steven Maldonado has been able to take food or drink to sun room.    Pertinent History  L TTA, CAD, PAF,  pacemaker, DM2, neuropathy, CKD, HTN,     Limitations  Lifting;Standing;Walking;House hold activities    Patient Stated Goals  To use prosthesis in community, travel (uses Lucianne Lei), take of himself so wife does not have to do it. Housework.     Currently in Pain?  No/denies                       [facility] Adult PT Treatment/Exercise - 06/23/19 1530      Transfers   Transfers  Sit to Stand;Stand to Sit    Sit to Stand  5: Supervision;With upper extremity assist;With armrests;From chair/3-in-1   to cane   Stand to Sit  5: Supervision;With upper extremity assist;With armrests;To chair/3-in-1   from cane     Ambulation/Gait   Ambulation/Gait  Yes    Ambulation/Gait Assistance  5: Supervision    Ambulation/Gait Assistance Details  worked on gait outdoors in Design[employer], television/film set, Development worker, community (Feet)  250 Feet   250' & 150' X 2   Assistive device  Prosthesis;Straight cane   cane quad tip   Gait Pattern  Step-through pattern;Decreased arm swing - left;Decreased step length - right;Decreased stance time - left;Decreased weight shift to left;Antalgic;Lateral hip instability;Abducted - left;Trunk flexed    Ambulation Surface  Indoor;Level;Outdoor;Paved;Gravel;Grass    Stairs  Yes    Stairs Assistance  5: Supervision    Stair Management Technique  One rail Left;With cane;Step to pattern;Forwards;One rail Right    Number of Stairs  4    Door Management  5: Supervision   cane & TTA prosthesis   Ramp  5: Supervision   cane qud tip & prosthesis   Ramp Details (indicate cue type and reason)  demo & verbal cues on proper weight shift    Curb  5: Supervision   cane quad tip & TTA prosthesis   Curb Details (indicate cue type and reason)  verbal cues on sequence & step thru       Prosthetics   Prosthetic Care Comments   limb volume changes & socket revisions ~6-9 months after initial prosthesis    Current prosthetic wear tolerance (days/week)   daily     Current prosthetic wear tolerance (#hours/day)   most of awake hours    Residual limb condition   intact per pt report               PT Short Term Goals - 05/19/19 2252      PT SHORT TERM GOAL #1   Title  Patient verbalizes proper adjustment of ply sock fit with volume fluctuations. (All updated STGs Target Date 06/11/2019)    Time  4    Period  Weeks    Status  Revised    Target Date  06/11/19      PT SHORT TERM GOAL #2   Title  Patient verbalizes & demonstrates understanding of OTAGO HEP.    Time  4    Period  Weeks    Status  Revised    Target Date  06/11/19      PT SHORT TERM GOAL #3   Title  Patient negotiates ramps & curbs with straight cane quad tip & prosthesis with supervision    Time  4    Period  Weeks    Status  Revised    Target Date  06/11/19      PT SHORT TERM GOAL #4   Title  Patient ambulates 150' with straight cane quad tip with supervision.    Time  4    Period  Weeks    Status  Revised    Target Date  06/11/19        PT Long Term Goals - 04/20/19 0755      PT LONG TERM GOAL #1   Title  Patient verbalizes & demonstrates proper prosthetic care including donning / doffing & wear daily for >90% of awake hours without skin or limb pain issues independently to enable safe use of prosthesis (All LTGs Target Date: 07/09/2019)    Time  3    Period  Months    Status  On-going      PT LONG TERM GOAL #2   Title  Patient demonstrates & verbalizes understanding of ongoing HEP / fitness plan.     Time  3  Period  Months    Status  On-going      PT LONG TERM GOAL #3   Title  Berg Balance >/= 47/56 to indicate lower fall risk & less dependency in standing ADLs.     Time  3    Period  Months    Status  On-going      PT LONG TERM GOAL #4   Title  Patient ambulates 250'  with straight cane quad tip & prosthesis modified independent.     Time  3    Period  Months    Status  On-going      PT LONG TERM GOAL #5   Title  Patient negotiates ramps, curbs with straight cane quad tip & stairs 1 rail /cane modified independent for community access.     Time  3    Period  Months    Status  On-going      PT LONG TERM GOAL #6   Title  Dynamic Gait Index with straight cane quad tip & prosthesis >12/24.     Time  3    Period  Months    Status  On-going      PT LONG TERM GOAL #7   Title  Gait Velocity with straight cane quad tip & prosthesis >1.8 ft/sec    Time  3    Period  Months    Status  On-going            Plan - 06/24/19 0757    Clinical Impression Statement  PT focused on progressing prosthetic gait to basic community level activities.    Rehab Potential  Good    PT Frequency  1x / week    PT Duration  12 weeks    PT Treatment/Interventions  ADLs/Self Care Home Management;Canalith Repostioning;DME Instruction;Gait training;Stair training;Functional mobility training;Therapeutic activities;Therapeutic exercise;Balance training;Neuromuscular re-education;Patient/family education;Prosthetic Training;Manual techniques;Vestibular    PT Next Visit Plan  work towards [place]- gait/barriers with cane/prosthesis, balance reactions.    Consulted and Agree with Plan of Care  Patient       Patient will benefit from skilled therapeutic intervention in order to improve the following deficits and impairments:  Abnormal gait, Decreased activity tolerance, Decreased balance, Decreased endurance, Decreased knowledge of use of DME, Decreased mobility, Impaired flexibility, Decreased range of motion, Decreased strength, Decreased skin integrity, Dizziness, Postural dysfunction, Prosthetic Dependency  Visit Diagnosis: 1. Muscle weakness (generalized)   2. Other abnormalities of gait and mobility   3. Unsteadiness on feet   4. Abnormal posture        Problem  List Patient Active Problem List   Diagnosis Date Noted  . Controlled type 2 diabetes mellitus without complication ([place]) 40/98/1191  . Bradycardia 09/01/2018  . Gangrene of left foot ([place]) 05/28/2018  . 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 Obstructive sleep apnea syndrome 05/04/2018  . Obstructive sleep apnea treated with continuous positive airway pressure (CPAP) 05/04/2018  . Type II diabetes mellitus, uncontrolled ([place]) 04/22/2018  . Atherosclerosis of native arteries of the extremities with gangrene ([place]) 04/09/2018  . Atherosclerosis of native artery of extremity ([place]) 04/09/2018  . Cellulitis of right toe 04/04/2018  . Paroxysmal atrial fibrillation ([place]) 12/05/2017  . Cardiac pacemaker in situ   . Symptomatic bradycardia   . Asystole ([place])   . 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 History of aortic valve replacement 12/02/2017  . Severe aortic stenosis   . Bilateral impacted cerumen  01/22/2016  . Essential hypertension 11/14/2015  . PVD (peripheral vascular disease) ([place]) 05/24/2014  . Chronic kidney disease 08/30/2013  . Hypothyroidism 08/30/2013  . 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 Diabetes mellitus type 2, uncomplicated ([place])   . Aortic valve stenosis 07/25/2011  . Hyperlipidemia 01/10/2011  . CAD (coronary artery disease) 01/10/2011  . Coronary arteriosclerosis 01/10/2011    Jamey Reas PT, DPT 06/24/2019, 7:59 AM  [facility] [address] [place], [name], [address] Phone: [phone]   Fax:  [phone]  Name: TEE RICHESON MRN: [id number] Date of Birth: 05/14/1941

## 2019-06-28 ENCOUNTER — Ambulatory Visit (INDEPENDENT_AMBULATORY_CARE_PROVIDER_SITE_OTHER): Payer: Medicare Other | Admitting: *Deleted

## 2019-06-28 DIAGNOSIS — I442 Atrioventricular block, complete: Secondary | ICD-10-CM

## 2019-06-28 DIAGNOSIS — H6123 Impacted cerumen, bilateral: Secondary | ICD-10-CM | POA: Diagnosis not present

## 2019-06-29 LAB — CUP PACEART REMOTE DEVICE CHECK
Battery Remaining Longevity: 112 mo
Battery Voltage: 2.96 V
Brady Statistic AP VP Percent: 49.7 %
Brady Statistic AP VS Percent: 0.01 %
Brady Statistic AS VP Percent: 50.18 %
Brady Statistic AS VS Percent: 0.11 %
Brady Statistic RA Percent Paced: 49.73 %
Brady Statistic RV Percent Paced: 99.88 %
Date Time Interrogation Session: 20200818053043
Implantable Lead Implant Date: 20190124
Implantable Lead Implant Date: 20190124
Implantable Lead Location: 753859
Implantable Lead Location: 753860
Implantable Lead Model: 5076
Implantable Lead Model: 5076
Implantable Pulse Generator Implant Date: 20190124
Lead Channel Impedance Value: 323 Ohm
Lead Channel Impedance Value: 361 Ohm
Lead Channel Impedance Value: 380 Ohm
Lead Channel Impedance Value: 437 Ohm
Lead Channel Pacing Threshold Amplitude: 0.625 V
Lead Channel Pacing Threshold Amplitude: 0.625 V
Lead Channel Pacing Threshold Pulse Width: 0.4 ms
Lead Channel Pacing Threshold Pulse Width: 0.4 ms
Lead Channel Sensing Intrinsic Amplitude: 2.125 mV
Lead Channel Sensing Intrinsic Amplitude: 2.125 mV
Lead Channel Sensing Intrinsic Amplitude: 24.875 mV
Lead Channel Sensing Intrinsic Amplitude: 7.75 mV
Lead Channel Setting Pacing Amplitude: 2 V
Lead Channel Setting Pacing Amplitude: 2.5 V
Lead Channel Setting Pacing Pulse Width: 0.4 ms
Lead Channel Setting Sensing Sensitivity: 4 mV

## 2019-06-30 ENCOUNTER — Ambulatory Visit: Payer: Medicare Other | Admitting: Physical Therapy

## 2019-06-30 ENCOUNTER — Other Ambulatory Visit: Payer: Self-pay

## 2019-06-30 ENCOUNTER — Encounter: Payer: Self-pay | Admitting: Physical Therapy

## 2019-06-30 DIAGNOSIS — R293 Abnormal posture: Secondary | ICD-10-CM | POA: Diagnosis not present

## 2019-06-30 DIAGNOSIS — M6281 Muscle weakness (generalized): Secondary | ICD-10-CM | POA: Diagnosis not present

## 2019-06-30 DIAGNOSIS — R2689 Other abnormalities of gait and mobility: Secondary | ICD-10-CM

## 2019-06-30 DIAGNOSIS — M6249 Contracture of muscle, multiple sites: Secondary | ICD-10-CM

## 2019-06-30 DIAGNOSIS — R2681 Unsteadiness on feet: Secondary | ICD-10-CM | POA: Diagnosis not present

## 2019-06-30 NOTE — Patient Outreach (Signed)
[facility]) Care Management  05/12/2019 Late entry  Steven Maldonado [birth year] [id number]  RN Health Coach attempted follow up outreach call to patient.  Patient was unavailable. HIPPA compliance voicemail message left with return callback number.  Plan: RN will call patient again within 30 days.  [facility] Care Management [phone]

## 2019-07-01 NOTE — Therapy (Signed)
[facility] [address] [place], [name], [address] Phone: [phone]   Fax:  [phone]  Physical Therapy Treatment  Patient Details  Name: Steven Maldonado MRN: [id number] Date of Birth: 05-02-1941 Referring Provider (PT): Mechele Claude, [place]   Encounter Date: 06/30/2019   CLINIC OPERATION CHANGES: Outpatient Neuro Rehab is open at lower capacity following universal masking, social distancing, and patient screening.  The patient's COVID risk of complications score is 7.   PT End of Session - 06/30/19 1635    Visit Number  77    Number of Visits  53    Date for PT Re-Evaluation  07/09/19    Authorization Type  Medicare & Federal BCBS    Authorization Time Period  Medicare guidelines, Fed BCBS waves co-pay.  75 visit limit PT, OT & ST combined with 1 used.    Authorization - Visit Number  28    Authorization - Number of Visits  75    PT Start Time  [id number]    PT Stop Time  [id number]    PT Time Calculation (min)  53 min    Equipment Utilized During Treatment  Gait belt    Activity Tolerance  Patient tolerated treatment well;No increased pain;Patient limited by fatigue    Behavior During Therapy  [facility] for tasks assessed/performed       Past Medical History:  Diagnosis Date  . Chronic diastolic CHF (congestive heart failure) ([place])   . CKD (chronic kidney disease), stage III ([place])   . Constipation   . Coronary artery disease    a. Cath February 2012 in [facility], occluded RCA with collaterals  . DM type 2 (diabetes mellitus, type 2) ([place])   . Essential hypertension   . Hyperlipidemia   . Neuropathy    feet  . Pacemaker    a. symptomatic brady after TAVR s/p MDT PPM by Dr. Curt Bears 12/04/17  . Persistent atrial fibrillation   . PONV (postoperative nausea and vomiting)    after valve surgery  . PVD (peripheral vascular disease) ([place])    a. s/p R popliteal artery stenosis tx with drug-coated balloon 05/2014, followed by Dr.  Fletcher Anon.  . 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 Severe aortic stenosis    a. 12/02/17: s/p TAVR  . Skin cancer   . Sleep apnea with use of continuous positive airway pressure (CPAP)    04-11-11 AHI was 32.9 and titrated to 15 cm H20, DME is [facility]  . Subclinical hypothyroidism     Past Surgical History:  Procedure Laterality Date  . ABDOMINAL ANGIOGRAM N/A 06/08/2014   Procedure: ABDOMINAL ANGIOGRAM;  Surgeon: Wellington Hampshire, MD;  Location: [facility] CATH LAB;  Service: Cardiovascular;  Laterality: N/A;  . ABDOMINAL AORTOGRAM N/A 04/09/2018   Procedure: ABDOMINAL AORTOGRAM;  Surgeon: Conrad Pringle, MD;  Location: [place] CV LAB;  Service: Cardiovascular;  Laterality: N/A;  . AMPUTATION Left 04/17/2018   Procedure: LEFT FOOT 3RD RAY AMPUTATION;  Surgeon: Newt Minion, MD;  Location: [place];  Service: Orthopedics;  Laterality: Left;  . AMPUTATION Left 05/28/2018   Procedure: LEFT AMPUTATION BELOW KNEE;  Surgeon: Wylene Simmer, MD;  Location: [facility];  Service: Orthopedics;  Laterality: Left;  . APPENDECTOMY  1965  . 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 CARDIAC CATHETERIZATION  12/2010  . CARDIOVERSION  07/2011  .  CARDIOVERSION N/A 04/18/2014   Procedure: CARDIOVERSION;  Surgeon: Dorothy Spark, MD;  Location: [place];  Service: Cardiovascular;  Laterality: N/A;  . CARDIOVERSION N/A 11/03/2015   Procedure: CARDIOVERSION;  Surgeon: Lelon Perla, MD;  Location[facility] ENDOSCOPY;  Service: Cardiovascular;  Laterality: N/A;  . CARDIOVERSION N/A 05/08/2017   Procedure: CARDIOVERSION;  Surgeon: Dorothy Spark, MD;  Location: [facility] ENDOSCOPY;  Service: Cardiovascular;  Laterality: N/A;  . CARDIOVERSION N/A 07/28/2017   Procedure: CARDIOVERSION;  Surgeon: Dorothy Spark, MD;  Location: [place];  Service: Cardiovascular;  Laterality: N/A;  . LOWER EXTREMITY  ANGIOGRAM N/A 06/08/2014   Procedure: LOWER EXTREMITY ANGIOGRAM;  Surgeon: Wellington Hampshire, MD;  Location: [facility] CATH LAB;  Service: Cardiovascular;  Laterality: N/A;  . LOWER EXTREMITY ANGIOGRAPHY Left 04/09/2018   Procedure: Lower Extremity Angiography;  Surgeon: Conrad Brilliant, MD;  Location: [place] CV LAB;  Service: Cardiovascular;  Laterality: Left;  . PACEMAKER IMPLANT N/A 12/04/2017   Procedure: PACEMAKER IMPLANT;  Surgeon: Constance Haw, MD;  Location: [place] CV LAB;  Service: Cardiovascular;  Laterality: N/A;  . PERIPHERAL VASCULAR BALLOON ANGIOPLASTY Left 04/09/2018   Procedure: PERIPHERAL VASCULAR BALLOON ANGIOPLASTY;  Surgeon: Conrad Roane, MD;  Location: [place] CV LAB;  Service: Cardiovascular;  Laterality: Left;  SFA  . POPLITEAL ARTERY ANGIOPLASTY Right 06/08/2014   Archie Endo 06/08/2014  . RIGHT/LEFT HEART CATH AND CORONARY ANGIOGRAPHY N/A 10/08/2017   Procedure: RIGHT/LEFT HEART CATH AND CORONARY ANGIOGRAPHY;  Surgeon: Burnell Blanks, MD;  Location: [place] CV LAB;  Service: Cardiovascular;  Laterality: N/A;  . SKIN CANCER EXCISION Bilateral    "have had them cut off back of neck X 2; off left upper arm; right wrist, near right shoulder blade" (06/08/2014)  . TEE WITHOUT CARDIOVERSION N/A 12/02/2017   Procedure: TRANSESOPHAGEAL ECHOCARDIOGRAM (TEE);  Surgeon: Burnell Blanks, MD;  Location: [facility];  Service: Open Heart Surgery;  Laterality: N/A;  . TEMPORARY PACEMAKER N/A 12/04/2017   Procedure: TEMPORARY PACEMAKER;  Surgeon: Leonie Man, MD;  Location: [place] CV LAB;  Service: Cardiovascular;  Laterality: N/A;  . TRANSCATHETER AORTIC VALVE REPLACEMENT, TRANSFEMORAL N/A 12/02/2017   Procedure: TRANSCATHETER AORTIC VALVE REPLACEMENT, TRANSFEMORAL;  Surgeon: Burnell Blanks, MD;  Location: [facility];  Service: Open Heart Surgery;  Laterality: N/A;  using Edwards Sapien 3 Transcatheter Heart Valve size 30mm    There were no vitals filed for  this visit.  Subjective Assessment - 06/30/19 1526    Subjective  He has been walking with cane on ramp as PT recommended. Questioning using prosthesis to get in/out of shower inside of electric scooter.    Pertinent History  L TTA, CAD, PAF,  pacemaker, DM2, neuropathy, CKD, HTN,     Limitations  Lifting;Standing;Walking;House hold activities    Patient Stated Goals  To use prosthesis in community, travel (uses Lucianne Lei), take of himself so wife does not have to do it. Housework.     Currently in Pain?  No/denies                       [facility] Adult PT Treatment/Exercise - 06/30/19 1530      Transfers   Transfers  Sit to Stand;Stand to Sit    Sit to Stand  6: Modified independent (Device/Increase time);With upper extremity assist;From chair/3-in-1   to cane, chairs without armrests   Stand to Sit  6: Modified independent (Device/Increase time);With upper extremity assist;To chair/3-in-1   from cane,  to chairs without armrests     Ambulation/Gait   Ambulation/Gait  Yes    Ambulation/Gait Assistance  5: Supervision    Ambulation Distance (Feet)  250 Feet   250' & 150' X 2   Assistive device  Prosthesis;Straight cane   cane quad tip   Gait Pattern  Step-through pattern;Decreased arm swing - left;Decreased step length - right;Decreased stance time - left;Decreased weight shift to left;Antalgic;Lateral hip instability;Abducted - left;Trunk flexed    Stairs  --    Stairs Assistance  --    Stair Management Technique  --    Number of Stairs  --    Door Management  --    Ramp  5: Supervision   cane qud tip & prosthesis   Ramp Details (indicate cue type and reason)  minor verbal cues on technique    Curb  5: Supervision   cane quad tip & TTA prosthesis   Curb Details (indicate cue type and reason)  minor verbal cues on technique    Gait Comments  gait inside parallel bars with goal not to use bars to work on gait without device within a room. Pt ambulated 8' bars multi reps (~6  laps) with 2 light touches.  Also worked on 32* turns, 180* turns and sidestepping without UE support. PT demo & verbal cue technique to change directions.       High Level Balance   High Level Balance Activities  Side stepping;Turns      Self-Care   Self-Care  ADL's    ADL's  PT verbally instructed patient in using prosthesis for entering/exiting bathroom for bathing. Pt verbalized understanding.        Prosthetics   Prosthetic Care Comments   --    Current prosthetic wear tolerance (days/week)   daily     Current prosthetic wear tolerance (#hours/day)   most of awake hours    Residual limb condition   intact per pt report             PT Education - 06/30/19 1610    Education Details  Recommend appt with Dr. Doran Durand for socket revision.  Weekly goal is to enter / exit all 3 exterior doors of home with cane.  Trial entering /exiting bathroom for bathing as PT taught today.    Person(s) Educated  Patient    Methods  Explanation;Verbal cues    Comprehension  Verbalized understanding       PT Short Term Goals - 05/19/19 2252      PT SHORT TERM GOAL #1   Title  Patient verbalizes proper adjustment of ply sock fit with volume fluctuations. (All updated STGs Target Date 06/11/2019)    Time  4    Period  Weeks    Status  Revised    Target Date  06/11/19      PT SHORT TERM GOAL #2   Title  Patient verbalizes & demonstrates understanding of OTAGO HEP.    Time  4    Period  Weeks    Status  Revised    Target Date  06/11/19      PT SHORT TERM GOAL #3   Title  Patient negotiates ramps & curbs with straight cane quad tip & prosthesis with supervision    Time  4    Period  Weeks    Status  Revised    Target Date  06/11/19      PT SHORT TERM GOAL #4   Title  Patient ambulates  150' with straight cane quad tip with supervision.    Time  4    Period  Weeks    Status  Revised    Target Date  06/11/19        PT Long Term Goals - 04/20/19 0755      PT LONG TERM GOAL #1    Title  Patient verbalizes & demonstrates proper prosthetic care including donning / doffing & wear daily for >90% of awake hours without skin or limb pain issues independently to enable safe use of prosthesis (All LTGs Target Date: 07/09/2019)    Time  3    Period  Months    Status  On-going      PT LONG TERM GOAL #2   Title  Patient demonstrates & verbalizes understanding of ongoing HEP / fitness plan.     Time  3    Period  Months    Status  On-going      PT LONG TERM GOAL #3   Title  Berg Balance >/= 47/56 to indicate lower fall risk & less dependency in standing ADLs.     Time  3    Period  Months    Status  On-going      PT LONG TERM GOAL #4   Title  Patient ambulates 250' with straight cane quad tip & prosthesis modified independent.     Time  3    Period  Months    Status  On-going      PT LONG TERM GOAL #5   Title  Patient negotiates ramps, curbs with straight cane quad tip & stairs 1 rail /cane modified independent for community access.     Time  3    Period  Months    Status  On-going      PT LONG TERM GOAL #6   Title  Dynamic Gait Index with straight cane quad tip & prosthesis >12/24.     Time  3    Period  Months    Status  On-going      PT LONG TERM GOAL #7   Title  Gait Velocity with straight cane quad tip & prosthesis >1.8 ft/sec    Time  3    Period  Months    Status  On-going            Plan - 06/30/19 1900    Clinical Impression Statement  Patient appears to understand use of prosthesis for bathing. He improved gait near bars without AD with education.  Patient is on target to meet LTGs next week.    Rehab Potential  Good    PT Frequency  1x / week    PT Duration  12 weeks    PT Treatment/Interventions  ADLs/Self Care Home Management;Canalith Repostioning;DME Instruction;Gait training;Stair training;Functional mobility training;Therapeutic activities;Therapeutic exercise;Balance training;Neuromuscular re-education;Patient/family  education;Prosthetic Training;Manual techniques;Vestibular    PT Next Visit Plan  check LTGs & discharge    Consulted and Agree with Plan of Care  Patient       Patient will benefit from skilled therapeutic intervention in order to improve the following deficits and impairments:  Abnormal gait, Decreased activity tolerance, Decreased balance, Decreased endurance, Decreased knowledge of use of DME, Decreased mobility, Impaired flexibility, Decreased range of motion, Decreased strength, Decreased skin integrity, Dizziness, Postural dysfunction, Prosthetic Dependency  Visit Diagnosis: 1. Muscle weakness (generalized)   2. Other abnormalities of gait and mobility   3. Unsteadiness on feet   4. Abnormal posture  5. Contracture of muscle, multiple sites        Problem List Patient Active Problem List   Diagnosis Date Noted  . Controlled type 2 diabetes mellitus without complication ([place]) [id number]  . Bradycardia 09/01/2018  . Gangrene of left foot ([place]) 05/28/2018  . 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 Obstructive sleep apnea syndrome 05/04/2018  . Obstructive sleep apnea treated with continuous positive airway pressure (CPAP) 05/04/2018  . Type II diabetes mellitus, uncontrolled ([place]) 04/22/2018  . Atherosclerosis of native arteries of the extremities with gangrene ([place]) 04/09/2018  . Atherosclerosis of native artery of extremity ([place]) 04/09/2018  . Cellulitis of right toe 04/04/2018  . Paroxysmal atrial fibrillation ([place]) 12/05/2017  . Cardiac pacemaker in situ   . Symptomatic bradycardia   . Asystole ([place])   . 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 History of aortic valve replacement 12/02/2017  . Severe aortic stenosis   . Bilateral impacted cerumen 01/22/2016  . Essential hypertension 11/14/2015  . PVD (peripheral vascular disease) ([place]) 05/24/2014  . Chronic kidney disease 08/30/2013  . Hypothyroidism  08/30/2013  . 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 Diabetes mellitus type 2, uncomplicated ([place])   . Aortic valve stenosis 07/25/2011  . Hyperlipidemia 01/10/2011  . CAD (coronary artery disease) 01/10/2011  . Coronary arteriosclerosis 01/10/2011    Jamey Reas PT, DPT 07/01/2019, 6:39 AM  [facility] [address] [place], [name], [address] Phone: [phone]   Fax:  [phone]  Name: Steven Maldonado MRN: [id number] Date of Birth: 28-Aug-1941

## 2019-07-02 ENCOUNTER — Other Ambulatory Visit: Payer: Self-pay | Admitting: *Deleted

## 2019-07-02 DIAGNOSIS — Z89512 Acquired absence of left leg below knee: Secondary | ICD-10-CM | POA: Diagnosis not present

## 2019-07-02 DIAGNOSIS — Z09 Encounter for follow-up examination after completed treatment for conditions other than malignant neoplasm: Secondary | ICD-10-CM | POA: Diagnosis not present

## 2019-07-02 NOTE — Patient Outreach (Signed)
[facility]) Care Management  07/02/2019   Steven Maldonado 1941-04-14 [id number]  RN Health Coach telephone call to patient.  [employer] compliance verified. RN Health Coach spoke with patient wife. Per wife the patient has been going to rehab. Per wife the patient is doing so much better since rehab. Patient is driving self around. Patient is now using a cane instead of the walker. Patient is using a freestyle libre. Fasting blood sugar is 120. Patient has been eating out and not adhering to diet. Per wife the patient is now back on his diet and monitoring closely. Patient and wife have agreed to further outreach calls.   Current Medications:  Current Outpatient Medications  Medication Sig Dispense Refill  . amiodarone (PACERONE) 200 MG tablet TAKE 1 TABLET BY MOUTH EVERY DAY 90 tablet 3  . atorvastatin (LIPITOR) 80 MG tablet Take 1 tablet (80 mg total) by mouth at bedtime. 90 tablet 3  . Blood Glucose Monitoring Suppl (FREESTYLE LITE) DEVI 1 each by Does not apply route 2 (two) times daily. 1 each 0  . Choline Fenofibrate (FENOFIBRIC ACID) 135 MG CPDR TAKE 1 CAPSULE DAILY 90 capsule 3  . dabigatran (PRADAXA) 150 MG CAPS capsule Take 1 capsule (150 mg total) by mouth every 12 (twelve) hours. 180 capsule 3  . docusate sodium (COLACE) 100 MG capsule Take 100 mg by mouth 2 (two) times daily.    . Evolocumab 140 MG/ML SOSY Inject 140 mg into the skin every 14 (fourteen) days. 2.1 mL 2  . furosemide (LASIX) 20 MG tablet Take 1 tablet (20 mg total) by mouth daily. (Patient taking differently: Take 20 mg by mouth every Monday, Wednesday, and Friday. ) 90 tablet 3  . glipiZIDE (GLUCOTROL XL) 10 MG 24 hr tablet TAKE 1 TABLET (10 MG TOTAL) BY MOUTH DAILY WITH BREAKFAST. 90 tablet 1  . glucose blood (FREESTYLE LITE) test strip 1 each by Other route 4 (four) times daily -  before meals and at bedtime. 450 each 3  . Insulin Degludec (TRESIBA FLEXTOUCH) 200 UNIT/ML SOPN Inject 25 Units into the  skin daily. Inject 25 units under the skin once daily.    . insulin lispro (HUMALOG KWIKPEN) 100 UNIT/ML KwikPen Inject 5 Units into the skin 3 (three) times daily. Inject 5 units under the skin three times daily before meals.    . Insulin Pen Needle (BD PEN NEEDLE NANO U/F) 32G X 4 MM MISC USE 4 (FOUR) TIMES DAILY. 1000 each 0  . levothyroxine (SYNTHROID) 175 MCG tablet TAKE 1 TABLET BY MOUTH EVERY DAY 90 tablet 1  . metoprolol tartrate (LOPRESSOR) 50 MG tablet TAKE ONE AND ONE-HALF TABLET BY MOUTH TWICE A DAY 270 tablet 2  . mirabegron ER (MYRBETRIQ) 25 MG TB24 tablet Take 1 tablet (25 mg total) by mouth daily. 90 tablet 3  . pentoxifylline (TRENTAL) 400 MG CR tablet Take 1 tablet (400 mg total) by mouth 3 (three) times daily with meals. 90 tablet 3  . silver sulfADIAZINE (SILVADENE) 1 % cream Apply 1 application topically daily. 50 g 0   No current facility-administered medications for this visit.     Functional Status:  In your present state of health, do you have any difficulty performing the following activities: 07/02/2019  Hearing? N  Vision? N  Difficulty concentrating or making decisions? N  Walking or climbing stairs? Y  Comment lt amp has a prosthesis  Dressing or bathing? Y  Comment wife assists  Doing errands, shopping? N  Preparing Food and eating ? N  Using the Toilet? N  In the past six months, have you accidently leaked urine? N  Do you have problems with loss of bowel control? N  Managing your Medications? N  Managing your Finances? N  Some recent data might be hidden    Fall/Depression Screening: Fall Risk  07/02/2019 12/14/2018 10/07/2018  Falls in the past year? 1 1 0  Number falls in past yr: 0 0 -  Injury with Fall? 0 0 -  Risk for fall due to : History of fall(s);Impaired balance/gait;Impaired mobility Impaired balance/gait;Impaired mobility;History of fall(s) -  Follow up Falls evaluation completed;Education provided;Falls prevention discussed Falls  evaluation completed;Falls prevention discussed -   PHQ 2/9 Scores 07/02/2019 12/14/2018 10/07/2018 07/28/2018 06/29/2018 01/12/2018 12/15/2017  PHQ - 2 Score 0 0 0 0 0 0 0  PHQ- 9 Score - - - - - - -   THN CM Care Plan Problem One     Most Recent Value  Care Plan Problem One  Knowledge Deficit in Self Management of diabetes  Role Documenting the Problem One  [place] for Problem One  Active  THN Long Term Goal   Patient will verbalize a decrease in A1C from 7.6 within the next 90 days  THN Long Term Goal Start Date  07/02/19  Interventions for Problem One Long Term Goal  RN discussed the increase in the A1C. RN discussed checking the blood sugar if eat out as per Dr ordered an hour after the meal and admister the insulin. RN will follow up on compliance  THN CM Short Term Goal #1   Patient will verbalize making healthier choices dining out within the next 30 days  Interventions for Short Term Goal #1  RN reiterated adhering to diet. RN discussed healthy eating. RN will follow up with further discussion  THN CM Short Term Goal #2   Patient will be available verbalize making better choices of snacks within the next 90 days  Interventions for Short Term Goal #2  RN reiterated adhering to diet and eating healthier snacks. RN willfollow up for further discussion       Assessment:  A1C increased from 5.9 to 7.6 Patient had not been adhering to diet Fasting blood sugar is 120 Patient has adequate food and medications during the pandemic Patient is attending Rehab  Plan: Patient will adhere to diet Patient will monitor blood sugars as per Dr ordered RN discussed medication adherence Patient will continue to exercise after finishing with rehab RN will follow up outreach call within the month of November   Steven Maldonado [facility] [phone]

## 2019-07-06 ENCOUNTER — Encounter: Payer: Self-pay | Admitting: Cardiology

## 2019-07-06 NOTE — Progress Notes (Signed)
Remote pacemaker transmission.   

## 2019-07-07 ENCOUNTER — Encounter: Payer: Self-pay | Admitting: Physical Therapy

## 2019-07-07 ENCOUNTER — Ambulatory Visit: Payer: Medicare Other | Admitting: Physical Therapy

## 2019-07-07 ENCOUNTER — Other Ambulatory Visit: Payer: Self-pay

## 2019-07-07 DIAGNOSIS — R2689 Other abnormalities of gait and mobility: Secondary | ICD-10-CM | POA: Diagnosis not present

## 2019-07-07 DIAGNOSIS — M6281 Muscle weakness (generalized): Secondary | ICD-10-CM | POA: Diagnosis not present

## 2019-07-07 DIAGNOSIS — R2681 Unsteadiness on feet: Secondary | ICD-10-CM | POA: Diagnosis not present

## 2019-07-07 DIAGNOSIS — R293 Abnormal posture: Secondary | ICD-10-CM

## 2019-07-07 DIAGNOSIS — M6249 Contracture of muscle, multiple sites: Secondary | ICD-10-CM | POA: Diagnosis not present

## 2019-07-07 NOTE — Therapy (Signed)
[facility] [address] [place], [name], [address] Phone: [phone]   Fax:  [phone]  Physical Therapy Treatment & Discharge Summary  Patient Details  Name: Steven Maldonado MRN: [id number] Date of Birth: 01-01-1941 Referring Provider (PT): Mechele Claude, [place]   Encounter Date: 07/07/2019   CLINIC OPERATION CHANGES: Outpatient Neuro Rehab is open at lower capacity following universal masking, social distancing, and patient screening.  The patient's COVID risk of complications score is 7.  PHYSICAL THERAPY DISCHARGE SUMMARY  Visits from Start of Care: 50  Current functional level related to goals / functional outcomes: See below   Remaining deficits: See below   Education / Equipment: HEP, prosthetic care, diabetic foot care.  Pt purchased single point cane. He is in process of getting socket revision.   Plan: Patient agrees to discharge.  Patient goals were met. Patient is being discharged due to meeting the stated rehab goals.  ?????           PT End of Session - 07/07/19 1646    Visit Number  50    Number of Visits  53    Date for PT Re-Evaluation  07/09/19    Authorization Type  Medicare & Federal BCBS    Authorization Time Period  Medicare guidelines, Fed BCBS waves co-pay.  75 visit limit PT, OT & ST combined with 1 used.    Authorization - Visit Number  28    Authorization - Number of Visits  75    PT Start Time  2992    PT Stop Time  1625    PT Time Calculation (min)  55 min    Equipment Utilized During Treatment  Gait belt    Activity Tolerance  Patient tolerated treatment well;No increased pain;Patient limited by fatigue    Behavior During Therapy  [facility] for tasks assessed/performed       Past Medical History:  Diagnosis Date  . Chronic diastolic CHF (congestive heart failure) ([place])   . CKD (chronic kidney disease), stage III ([place])   . Constipation   . Coronary artery disease    a.  Cath February 2012 in [place] [facility], occluded RCA with collaterals  . DM type 2 (diabetes mellitus, type 2) ([place])   . Essential hypertension   . Hyperlipidemia   . Neuropathy    feet  . Pacemaker    a. symptomatic brady after TAVR s/p MDT PPM by Dr. Curt Bears 12/04/17  . Persistent atrial fibrillation   . PONV (postoperative nausea and vomiting)    after valve surgery  . PVD (peripheral vascular disease) ([place])    a. s/p R popliteal artery stenosis tx with drug-coated balloon 05/2014, followed by Dr. Fletcher Anon.  . 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 Severe aortic stenosis    a. 12/02/17: s/p TAVR  . Skin cancer   . Sleep apnea with use of continuous positive airway pressure (CPAP)    04-11-11 AHI was 32.9 and titrated to 15 cm H20, DME is [facility]  . Subclinical hypothyroidism     Past Surgical History:  Procedure Laterality Date  . ABDOMINAL ANGIOGRAM N/A 06/08/2014   Procedure: ABDOMINAL ANGIOGRAM;  Surgeon: Wellington Hampshire, MD;  Location: [facility] CATH LAB;  Service: Cardiovascular;  Laterality: N/A;  . ABDOMINAL AORTOGRAM N/A 04/09/2018   Procedure: ABDOMINAL AORTOGRAM;  Surgeon: Conrad Guayanilla, MD;  Location: [facility]  INVASIVE CV LAB;  Service: Cardiovascular;  Laterality: N/A;  . AMPUTATION Left 04/17/2018   Procedure: LEFT FOOT 3RD RAY AMPUTATION;  Surgeon: Newt Minion, MD;  Location: [place];  Service: Orthopedics;  Laterality: Left;  . AMPUTATION Left 05/28/2018   Procedure: LEFT AMPUTATION BELOW KNEE;  Surgeon: Wylene Simmer, MD;  Location: [place];  Service: Orthopedics;  Laterality: Left;  . APPENDECTOMY  1965  . 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 CARDIAC CATHETERIZATION  12/2010  . CARDIOVERSION  07/2011  . CARDIOVERSION N/A 04/18/2014   Procedure: CARDIOVERSION;  Surgeon: Dorothy Spark, MD;  Location: [place];  Service: Cardiovascular;  Laterality: N/A;  .  CARDIOVERSION N/A 11/03/2015   Procedure: CARDIOVERSION;  Surgeon: Lelon Perla, MD;  Location: [facility] ENDOSCOPY;  Service: Cardiovascular;  Laterality: N/A;  . CARDIOVERSION N/A 05/08/2017   Procedure: CARDIOVERSION;  Surgeon: Dorothy Spark, MD;  Location: [facility] ENDOSCOPY;  Service: Cardiovascular;  Laterality: N/A;  . CARDIOVERSION N/A 07/28/2017   Procedure: CARDIOVERSION;  Surgeon: Dorothy Spark, MD;  Location: [place];  Service: Cardiovascular;  Laterality: N/A;  . LOWER EXTREMITY ANGIOGRAM N/A 06/08/2014   Procedure: LOWER EXTREMITY ANGIOGRAM;  Surgeon: Wellington Hampshire, MD;  Location: [facility] CATH LAB;  Service: Cardiovascular;  Laterality: N/A;  . LOWER EXTREMITY ANGIOGRAPHY Left 04/09/2018   Procedure: Lower Extremity Angiography;  Surgeon: Conrad Middleton, MD;  Location: [place] CV LAB;  Service: Cardiovascular;  Laterality: Left;  . PACEMAKER IMPLANT N/A 12/04/2017   Procedure: PACEMAKER IMPLANT;  Surgeon: Constance Haw, MD;  Location: [place] CV LAB;  Service: Cardiovascular;  Laterality: N/A;  . PERIPHERAL VASCULAR BALLOON ANGIOPLASTY Left 04/09/2018   Procedure: PERIPHERAL VASCULAR BALLOON ANGIOPLASTY;  Surgeon: Conrad Black Rock, MD;  Location: [place] CV LAB;  Service: Cardiovascular;  Laterality: Left;  SFA  . POPLITEAL ARTERY ANGIOPLASTY Right 06/08/2014   Archie Endo 06/08/2014  . RIGHT/LEFT HEART CATH AND CORONARY ANGIOGRAPHY N/A 10/08/2017   Procedure: RIGHT/LEFT HEART CATH AND CORONARY ANGIOGRAPHY;  Surgeon: Burnell Blanks, MD;  Location: [place] CV LAB;  Service: Cardiovascular;  Laterality: N/A;  . SKIN CANCER EXCISION Bilateral    "have had them cut off back of neck X 2; off left upper arm; right wrist, near right shoulder blade" (06/08/2014)  . TEE WITHOUT CARDIOVERSION N/A 12/02/2017   Procedure: TRANSESOPHAGEAL ECHOCARDIOGRAM (TEE);  Surgeon: Burnell Blanks, MD;  Location: [place];  Service: Open Heart Surgery;  Laterality: N/A;  . TEMPORARY  PACEMAKER N/A 12/04/2017   Procedure: TEMPORARY PACEMAKER;  Surgeon: Leonie Man, MD;  Location: [place] CV LAB;  Service: Cardiovascular;  Laterality: N/A;  . TRANSCATHETER AORTIC VALVE REPLACEMENT, TRANSFEMORAL N/A 12/02/2017   Procedure: TRANSCATHETER AORTIC VALVE REPLACEMENT, TRANSFEMORAL;  Surgeon: Burnell Blanks, MD;  Location: [place];  Service: Open Heart Surgery;  Laterality: N/A;  using Edwards Sapien 3 Transcatheter Heart Valve size 64m    There were no vitals filed for this visit.  Subjective Assessment - 07/07/19 1530    Subjective  He is getting casted for new socket on 07/12/2019. He was able to enter/exit home from all exterior doors. He used prosthesis to walk in/out of bathroom for bathing.    Pertinent History  L TTA, CAD, PAF,  pacemaker, DM2, neuropathy, CKD, HTN,     Limitations  Lifting;Standing;Walking;House hold activities    Patient Stated Goals  To use prosthesis in community, travel (uses vLucianne Lei, take of himself so wife does not have to do  it. Housework.     Currently in Pain?  No/denies         [facility] PT Assessment - 07/07/19 1530      Assessment   Medical Diagnosis  Left Transtibial Amputation    Referring Provider (PT)  Mechele Claude, PA    Onset Date/Surgical Date  08/14/18      Transfers   Transfers  Sit to Stand;Stand to Sit    Sit to Stand  6: Modified independent (Device/Increase time);With upper extremity assist;From chair/3-in-1    Stand to Sit  6: Modified independent (Device/Increase time);With upper extremity assist;To chair/3-in-1      Ambulation/Gait   Ambulation/Gait  Yes    Ambulation/Gait Assistance  6: Modified independent (Device/Increase time)    Ambulation/Gait Assistance Details  short distances (15') within room no assistive device,  up to 250' with cane,  longer distances with rollator or shopping cart    Ambulation Distance (Feet)  250 Feet   250' cane, 15' no assistive device   Assistive device  Prosthesis;Straight  cane;None;Rollator   cane quad tip   Gait Pattern  Step-through pattern;Decreased stance time - left;Trunk flexed    Ambulation Surface  Indoor;Level;Outdoor;Paved    Gait velocity  2.40 ft/sec cane   Initial was 1.59 ft/sec RW   Stairs  Yes    Stairs Assistance  6: Modified independent (Device/Increase time)    Stair Management Technique  One rail Right;With cane;Step to pattern;Forwards    Number of Stairs  4    Height of Stairs  6    Door Management  6: Modified independent (Device/Increase time)   cane & TTA prosthesis   Ramp  6: Modified independent (Device)   cane & TTA prosthesis   Curb  6: Modified independent (Device/increase time)   cane & TTA prosthesis     Standardized Balance Assessment   Standardized Balance Assessment  Berg Balance Test;Dynamic Gait Index      Berg Balance Test   Sit to Stand  Able to stand  independently using hands    Standing Unsupported  Able to stand safely 2 minutes    Sitting with Back Unsupported but Feet Supported on Floor or Stool  Able to sit safely and securely 2 minutes    Stand to Sit  Sits safely with minimal use of hands    Transfers  Able to transfer safely, minor use of hands    Standing Unsupported with Eyes Closed  Able to stand 10 seconds safely    Standing Unsupported with Feet Together  Able to place feet together independently and stand 1 minute safely    From Standing, Reach Forward with Outstretched Arm  Can reach confidently >25 cm (10")    From Standing Position, Pick up Object from Floor  Able to pick up shoe safely and easily    From Standing Position, Turn to Look Behind Over each Shoulder  Looks behind from both sides and weight shifts well    Turn 360 Degrees  Able to turn 360 degrees safely but slowly    Standing Unsupported, Alternately Place Feet on Step/Stool  Able to complete 4 steps without aid or supervision    Standing Unsupported, One Foot in Front  Able to plae foot ahead of the other independently and hold  30 seconds    Standing on One Leg  Tries to lift leg/unable to hold 3 seconds but remains standing independently    Total Score  47    Berg comment:  10/23  eval  19/56,  11/27/2018 was 36/56      Dynamic Gait Index   Level Surface  Mild Impairment    Change in Gait Speed  Mild Impairment    Gait with Horizontal Head Turns  Mild Impairment    Gait with Vertical Head Turns  Mild Impairment    Gait and Pivot Turn  Mild Impairment    Step Over Obstacle  Moderate Impairment    Step Around Obstacles  Mild Impairment    Steps  Moderate Impairment    Total Score  14      Prosthetics Assessment - 07/07/19 1530      Prosthetics   Prosthetic Care Independent with  Skin check;Residual limb care;Care of non-amputated limb;Prosthetic cleaning;Ply sock cleaning;Correct ply sock adjustment;Proper wear schedule/adjustment;Proper weight-bearing schedule/adjustment    Prosthetic Care Comments   PT verbally cued pt on donning new suction sleeve system that is planned for new socket.  Pt & wife verbalized understanding.     Donning prosthesis   Modified independent (Device/Increase time)    Doffing prosthesis   Modified independent (Device/Increase time)    Current prosthetic wear tolerance (days/week)   daily     Current prosthetic wear tolerance (#hours/day)   most of awake hours    Edema  non-pitting    Residual limb condition   intact per pt report                            PT Short Term Goals - 05/19/19 2252      PT SHORT TERM GOAL #1   Title  Patient verbalizes proper adjustment of ply sock fit with volume fluctuations. (All updated STGs Target Date 06/11/2019)    Time  4    Period  Weeks    Status  Revised    Target Date  06/11/19      PT SHORT TERM GOAL #2   Title  Patient verbalizes & demonstrates understanding of OTAGO HEP.    Time  4    Period  Weeks    Status  Revised    Target Date  06/11/19      PT SHORT TERM GOAL #3   Title  Patient negotiates ramps &  curbs with straight cane quad tip & prosthesis with supervision    Time  4    Period  Weeks    Status  Revised    Target Date  06/11/19      PT SHORT TERM GOAL #4   Title  Patient ambulates 150' with straight cane quad tip with supervision.    Time  4    Period  Weeks    Status  Revised    Target Date  06/11/19        PT Long Term Goals - 07/07/19 1647      PT LONG TERM GOAL #1   Title  Patient verbalizes & demonstrates proper prosthetic care including donning / doffing & wear daily for >90% of awake hours without skin or limb pain issues independently to enable safe use of prosthesis (All LTGs Target Date: 07/09/2019)    Baseline  MET 07/07/2019    Time  3    Period  Months    Status  Achieved      PT LONG TERM GOAL #2   Title  Patient demonstrates & verbalizes understanding of ongoing HEP / fitness plan.     Baseline  MET 07/07/2019    Time  3    Period  Months    Status  Achieved      PT LONG TERM GOAL #3   Title  Berg Balance >/= 47/56 to indicate lower fall risk & less dependency in standing ADLs.     Baseline  MET 07/07/2019  Berg 47/56    Time  3    Period  Months    Status  Achieved      PT LONG TERM GOAL #4   Title  Patient ambulates 250' with straight cane quad tip & prosthesis modified independent.     Baseline  MET 07/07/2019    Time  3    Period  Months    Status  Achieved      PT LONG TERM GOAL #5   Title  Patient negotiates ramps, curbs with straight cane quad tip & stairs 1 rail /cane modified independent for community access.     Baseline  MET 07/07/2019    Time  3    Period  Months    Status  Achieved      PT LONG TERM GOAL #6   Title  Dynamic Gait Index with straight cane quad tip & prosthesis >12/24.     Baseline  MET 07/07/2019  DGI cane 14/24    Time  3    Period  Months    Status  Achieved      PT LONG TERM GOAL #7   Title  Gait Velocity with straight cane quad tip & prosthesis >1.8 ft/sec    Baseline  MET 07/07/2019  Gait Velocity with  cane 2.40 ft/sec    Time  3    Period  Months    Status  Achieved            Plan - 07/07/19 1657    Clinical Impression Statement  Patient met all LTGs set at recertification.  Patient is ambulating with cane at basic community level and within home. He can safely ambulate 15' (within a room) with prosthesis only.  His Berg Balance score improved to 47/56 indicating lower fall risk.  Dynamic Gait Index with cane 14/24 is fall risk (<18/24 indicates fall risk) but improvement from previously. Patient improved gait velocity to 2.40 ft/sec indicating lower fall risk.    Rehab Potential  Good    PT Frequency  1x / week    PT Duration  12 weeks    PT Treatment/Interventions  ADLs/Self Care Home Management;Canalith Repostioning;DME Instruction;Gait training;Stair training;Functional mobility training;Therapeutic activities;Therapeutic exercise;Balance training;Neuromuscular re-education;Patient/family education;Prosthetic Training;Manual techniques;Vestibular    PT Next Visit Plan  discharge    Consulted and Agree with Plan of Care  Patient       Patient will benefit from skilled therapeutic intervention in order to improve the following deficits and impairments:  Abnormal gait, Decreased activity tolerance, Decreased balance, Decreased endurance, Decreased knowledge of use of DME, Decreased mobility, Impaired flexibility, Decreased range of motion, Decreased strength, Decreased skin integrity, Dizziness, Postural dysfunction, Prosthetic Dependency  Visit Diagnosis: Muscle weakness (generalized)  Other abnormalities of gait and mobility  Unsteadiness on feet  Abnormal posture     Problem List Patient Active Problem List   Diagnosis Date Noted  . Controlled type 2 diabetes mellitus without complication ([place]) 56/31/4970  . Bradycardia 09/01/2018  . Gangrene of left foot ([place]) 05/28/2018  . 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 Obstructive  sleep apnea syndrome 05/04/2018  . Obstructive sleep apnea treated with  continuous positive airway pressure (CPAP) 05/04/2018  . Type II diabetes mellitus, uncontrolled ([place]) 04/22/2018  . Atherosclerosis of native arteries of the extremities with gangrene ([place]) 04/09/2018  . Atherosclerosis of native artery of extremity ([place]) 04/09/2018  . Cellulitis of right toe 04/04/2018  . Paroxysmal atrial fibrillation ([place]) 12/05/2017  . Cardiac pacemaker in situ   . Symptomatic bradycardia   . Asystole ([place])   . 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 History of aortic valve replacement 12/02/2017  . Severe aortic stenosis   . Bilateral impacted cerumen 01/22/2016  . Essential hypertension 11/14/2015  . PVD (peripheral vascular disease) ([place]) 05/24/2014  . Chronic kidney disease 08/30/2013  . Hypothyroidism 08/30/2013  . 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 Diabetes mellitus type 2, uncomplicated ([place])   . Aortic valve stenosis 07/25/2011  . Hyperlipidemia 01/10/2011  . CAD (coronary artery disease) 01/10/2011  . Coronary arteriosclerosis 01/10/2011    Jamey Reas PT, DPT 07/07/2019, 5:02 PM  [facility] [address] [place], [name], [address] Phone: [phone]   Fax:  [phone]  Name: SARVESH MEDDAUGH MRN: [id number] Date of Birth: 08/21/1941

## 2019-07-13 ENCOUNTER — Ambulatory Visit: Payer: Medicare Other | Admitting: Adult Health

## 2019-07-14 ENCOUNTER — Encounter: Payer: Self-pay | Admitting: Adult Health

## 2019-07-16 ENCOUNTER — Other Ambulatory Visit: Payer: Self-pay

## 2019-07-16 DIAGNOSIS — I779 Disorder of arteries and arterioles, unspecified: Secondary | ICD-10-CM

## 2019-07-21 ENCOUNTER — Encounter (HOSPITAL_COMMUNITY): Payer: Medicare Other

## 2019-07-21 ENCOUNTER — Ambulatory Visit: Payer: Medicare Other | Admitting: Family

## 2019-08-06 ENCOUNTER — Other Ambulatory Visit: Payer: Self-pay | Admitting: Endocrinology

## 2019-08-06 DIAGNOSIS — IMO0001 Reserved for inherently not codable concepts without codable children: Secondary | ICD-10-CM

## 2019-08-10 DIAGNOSIS — Z85828 Personal history of other malignant neoplasm of skin: Secondary | ICD-10-CM | POA: Diagnosis not present

## 2019-08-10 DIAGNOSIS — C44319 Basal cell carcinoma of skin of other parts of face: Secondary | ICD-10-CM | POA: Diagnosis not present

## 2019-08-10 DIAGNOSIS — D0462 Carcinoma in situ of skin of left upper limb, including shoulder: Secondary | ICD-10-CM | POA: Diagnosis not present

## 2019-08-10 DIAGNOSIS — X32XXXA Exposure to sunlight, initial encounter: Secondary | ICD-10-CM | POA: Diagnosis not present

## 2019-08-10 DIAGNOSIS — D0461 Carcinoma in situ of skin of right upper limb, including shoulder: Secondary | ICD-10-CM | POA: Diagnosis not present

## 2019-08-10 DIAGNOSIS — L570 Actinic keratosis: Secondary | ICD-10-CM | POA: Diagnosis not present

## 2019-08-10 DIAGNOSIS — D485 Neoplasm of uncertain behavior of skin: Secondary | ICD-10-CM | POA: Diagnosis not present

## 2019-08-10 DIAGNOSIS — Z08 Encounter for follow-up examination after completed treatment for malignant neoplasm: Secondary | ICD-10-CM | POA: Diagnosis not present

## 2019-08-10 DIAGNOSIS — C44612 Basal cell carcinoma of skin of right upper limb, including shoulder: Secondary | ICD-10-CM | POA: Diagnosis not present

## 2019-09-06 ENCOUNTER — Other Ambulatory Visit: Payer: Medicare Other

## 2019-09-07 DIAGNOSIS — Z23 Encounter for immunization: Secondary | ICD-10-CM | POA: Diagnosis not present

## 2019-09-09 ENCOUNTER — Encounter: Payer: Medicare Other | Admitting: Endocrinology

## 2019-09-09 NOTE — Progress Notes (Signed)
This encounter was created in error - please disregard.

## 2019-09-15 ENCOUNTER — Other Ambulatory Visit: Payer: Self-pay

## 2019-09-15 ENCOUNTER — Telehealth: Payer: Self-pay | Admitting: Endocrinology

## 2019-09-15 MED ORDER — BD PEN NEEDLE NANO U/F 32G X 4 MM MISC: 0 refills | Status: DC

## 2019-09-15 NOTE — Telephone Encounter (Signed)
Steven Maldonado [phone]  [facility] [place] [address]  Nano Pen needles  Joachim called to say that he needs refill on above needles, his pharmacy said they were waiting on [place] and he only has one left.

## 2019-09-15 NOTE — Telephone Encounter (Signed)
Rx sent 

## 2019-09-24 ENCOUNTER — Other Ambulatory Visit: Payer: Self-pay | Admitting: Endocrinology

## 2019-09-27 ENCOUNTER — Ambulatory Visit (INDEPENDENT_AMBULATORY_CARE_PROVIDER_SITE_OTHER): Payer: Medicare Other | Admitting: *Deleted

## 2019-09-27 DIAGNOSIS — I480 Paroxysmal atrial fibrillation: Secondary | ICD-10-CM | POA: Diagnosis not present

## 2019-09-27 DIAGNOSIS — R001 Bradycardia, unspecified: Secondary | ICD-10-CM

## 2019-09-28 LAB — CUP PACEART REMOTE DEVICE CHECK
Battery Remaining Longevity: 110 mo
Battery Voltage: 2.95 V
Brady Statistic AP VP Percent: 41.72 %
Brady Statistic AP VS Percent: 0 %
Brady Statistic AS VP Percent: 58.14 %
Brady Statistic AS VS Percent: 0.13 %
Brady Statistic RA Percent Paced: 41.76 %
Brady Statistic RV Percent Paced: 99.86 %
Date Time Interrogation Session: 20201116052143
Implantable Lead Implant Date: 20190124
Implantable Lead Implant Date: 20190124
Implantable Lead Location: 753859
Implantable Lead Location: 753860
Implantable Lead Model: 5076
Implantable Lead Model: 5076
Implantable Pulse Generator Implant Date: 20190124
Lead Channel Impedance Value: 323 Ohm
Lead Channel Impedance Value: 361 Ohm
Lead Channel Impedance Value: 380 Ohm
Lead Channel Impedance Value: 456 Ohm
Lead Channel Pacing Threshold Amplitude: 0.625 V
Lead Channel Pacing Threshold Amplitude: 0.625 V
Lead Channel Pacing Threshold Pulse Width: 0.4 ms
Lead Channel Pacing Threshold Pulse Width: 0.4 ms
Lead Channel Sensing Intrinsic Amplitude: 17.5 mV
Lead Channel Sensing Intrinsic Amplitude: 17.5 mV
Lead Channel Sensing Intrinsic Amplitude: 2.125 mV
Lead Channel Sensing Intrinsic Amplitude: 2.125 mV
Lead Channel Setting Pacing Amplitude: 2 V
Lead Channel Setting Pacing Amplitude: 2.5 V
Lead Channel Setting Pacing Pulse Width: 0.4 ms
Lead Channel Setting Sensing Sensitivity: 4 mV

## 2019-09-29 ENCOUNTER — Ambulatory Visit: Payer: Self-pay | Admitting: *Deleted

## 2019-10-05 NOTE — Progress Notes (Signed)
Subjective:    Patient ID: Steven Maldonado, male    DOB: 1941-08-20, 77 y.o.   MRN: [id number]  HPI:  Steven Maldonado is here to establish as a new pt.  He is a pleasant 78 year old male. PMH: T2D, CHB, HLD, A Fib, CAD, Hypothyroidism, Obesity, re-current lower ext infections/cellulitis Recent L Below the knee amputation  Endocrinology/Dr. 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  Afib- Pradaxa and amiodarone  OSA- compliant on CPAP  Patient Care Team    Relationship Specialty Notifications Start End  Mina Marble D, NP PCP - General Family Medicine  10/06/19   Newt Minion, MD Consulting Physician Orthopedic Surgery  04/09/18   Pleasant, Eppie Gibson, [place] [facility]  Admissions 09/03/18   Janice Coffin  Cardiology  10/06/19   Wylene Simmer, MD Consulting Physician Orthopedic Surgery  10/06/19   Elayne Snare, MD Consulting Physician Endocrinology  10/06/19   Dasher, Rayvon Char, MD  Dermatology  10/06/19   Alanson Puls, [place]    10/06/19     Patient Active Problem List   Diagnosis Date Noted  . Healthcare maintenance 10/06/2019  . Controlled type 2 diabetes mellitus without complication ([place]) [id number]  . Bradycardia 09/01/2018  . Gangrene of left foot ([place]) 05/28/2018  . 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 Obstructive  sleep apnea syndrome 05/04/2018  . Obstructive sleep apnea treated with continuous positive airway pressure (CPAP) 05/04/2018  . Type II diabetes mellitus, uncontrolled ([place]) 04/22/2018  . Atherosclerosis of native arteries of the extremities with gangrene ([place]) 04/09/2018  . Atherosclerosis of native artery of extremity ([place]) 04/09/2018  . Cellulitis of right toe 04/04/2018  . Paroxysmal atrial fibrillation ([place]) 12/05/2017  . Cardiac pacemaker in situ   . Symptomatic bradycardia   . Asystole ([place])   . 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 History of aortic valve replacement 12/02/2017  . Severe aortic stenosis   . Bilateral impacted cerumen 01/22/2016  . Essential hypertension 11/14/2015  . PVD (peripheral vascular disease) ([place]) 05/24/2014  . Chronic kidney disease 08/30/2013  . Hypothyroidism 08/30/2013  . 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 Diabetes mellitus type 2, uncomplicated ([place])   . Aortic valve stenosis 07/25/2011  . Hyperlipidemia 01/10/2011  . CAD (coronary artery disease) 01/10/2011  . Coronary arteriosclerosis 01/10/2011     Past Medical History:  Diagnosis Date  . Chronic diastolic CHF (congestive heart failure) ([place])   . CKD (chronic kidney disease), stage III   . Constipation   . Coronary artery disease    a. Cath February 2012 in [facility], occluded RCA with collaterals  . DM type 2 (diabetes mellitus, type 2) ([place])   . Essential hypertension   . Hyperlipidemia   . Neuropathy    feet  . Pacemaker  a. symptomatic brady after TAVR s/p MDT PPM by Dr. Curt Bears 12/04/17  . Persistent atrial fibrillation ([place])   . PONV (postoperative nausea and vomiting)    after valve surgery  . PVD (peripheral vascular disease) ([place])    a. s/p R popliteal artery stenosis tx with drug-coated balloon 05/2014, followed by Dr. Fletcher Anon.  . 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 Severe aortic stenosis    a. 12/02/17: s/p TAVR  . Skin cancer   . Sleep apnea with use of continuous positive airway pressure (CPAP)    04-11-11 AHI was 32.9 and titrated to 15 cm H20, DME is [facility]  . Subclinical hypothyroidism      Past Surgical History:  Procedure Laterality Date  . ABDOMINAL ANGIOGRAM N/A 06/08/2014   Procedure: ABDOMINAL ANGIOGRAM;  Surgeon: Wellington Hampshire, MD;  Location: [facility] CATH LAB;  Service: Cardiovascular;  Laterality: N/A;  . ABDOMINAL AORTOGRAM N/A 04/09/2018   Procedure: ABDOMINAL AORTOGRAM;  Surgeon: Conrad Spur, MD;  Location: [place] CV LAB;  Service: Cardiovascular;  Laterality: N/A;  . AMPUTATION Left 04/17/2018   Procedure: LEFT FOOT 3RD RAY AMPUTATION;  Surgeon: Newt Minion, MD;  Location: [place];  Service: Orthopedics;  Laterality: Left;  . AMPUTATION Left 05/28/2018   Procedure: LEFT AMPUTATION BELOW KNEE;  Surgeon: Wylene Simmer, MD;  Location: [place];  Service: Orthopedics;  Laterality: Left;  . APPENDECTOMY  1965  . 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 CARDIAC CATHETERIZATION  12/2010  . CARDIOVERSION  07/2011  . CARDIOVERSION N/A 04/18/2014   Procedure: CARDIOVERSION;  Surgeon: Dorothy Spark, MD;  Location: [place];  Service: Cardiovascular;  Laterality: N/A;  . CARDIOVERSION N/A 11/03/2015   Procedure: CARDIOVERSION;  Surgeon: Lelon Perla, MD;  Location: [facility] ENDOSCOPY;  Service: Cardiovascular;  Laterality: N/A;  . CARDIOVERSION N/A 05/08/2017   Procedure: CARDIOVERSION;  Surgeon: Dorothy Spark, MD;  Location[facility] ENDOSCOPY;  Service: Cardiovascular;  Laterality: N/A;  . CARDIOVERSION N/A 07/28/2017   Procedure: CARDIOVERSION;  Surgeon: Dorothy Spark, MD;  Location: [place];  Service: Cardiovascular;  Laterality: N/A;  . LOWER EXTREMITY ANGIOGRAM N/A 06/08/2014   Procedure: LOWER EXTREMITY ANGIOGRAM;  Surgeon: Wellington Hampshire, MD;   Location: [facility] CATH LAB;  Service: Cardiovascular;  Laterality: N/A;  . LOWER EXTREMITY ANGIOGRAPHY Left 04/09/2018   Procedure: Lower Extremity Angiography;  Surgeon: Conrad Abingdon, MD;  Location: [place] CV LAB;  Service: Cardiovascular;  Laterality: Left;  . PACEMAKER IMPLANT N/A 12/04/2017   Procedure: PACEMAKER IMPLANT;  Surgeon: Constance Haw, MD;  Location: [place] CV LAB;  Service: Cardiovascular;  Laterality: N/A;  . PERIPHERAL VASCULAR BALLOON ANGIOPLASTY Left 04/09/2018   Procedure: PERIPHERAL VASCULAR BALLOON ANGIOPLASTY;  Surgeon: Conrad Commerce, MD;  Location: [place] CV LAB;  Service: Cardiovascular;  Laterality: Left;  SFA  . POPLITEAL ARTERY ANGIOPLASTY Right 06/08/2014   Archie Endo 06/08/2014  . RIGHT/LEFT HEART CATH AND CORONARY ANGIOGRAPHY N/A 10/08/2017   Procedure: RIGHT/LEFT HEART CATH AND CORONARY ANGIOGRAPHY;  Surgeon: Burnell Blanks, MD;  Location: [place] CV LAB;  Service: Cardiovascular;  Laterality: N/A;  . SKIN CANCER EXCISION Bilateral    "have had them cut off back of neck X 2; off left upper arm; right wrist, near right shoulder blade" (06/08/2014)  . TEE WITHOUT CARDIOVERSION N/A 12/02/2017   Procedure: TRANSESOPHAGEAL ECHOCARDIOGRAM (TEE);  Surgeon: Burnell Blanks, MD;  Location: [place];  Service: Open Heart Surgery;  Laterality: N/A;  . TEMPORARY PACEMAKER N/A 12/04/2017   Procedure: TEMPORARY PACEMAKER;  Surgeon: Leonie Man, MD;  Location: [place] CV LAB;  Service: Cardiovascular;  Laterality: N/A;  . TRANSCATHETER AORTIC VALVE REPLACEMENT, TRANSFEMORAL N/A 12/02/2017   Procedure: TRANSCATHETER AORTIC VALVE REPLACEMENT, TRANSFEMORAL;  Surgeon: Burnell Blanks, MD;  Location: [place];  Service: Open Heart Surgery;  Laterality: N/A;  using Edwards Sapien 3 Transcatheter Heart Valve size 22mm     Family History  Problem Relation Age of Onset  . Diabetes Mother   . Heart attack Mother   . Hypertension Mother   . Heart  attack Father   . Heart failure Father   . Hypertension Father   . Diabetes Father   . Diabetes Sister   . Diabetes Brother   . Diabetes Other   . Diabetes Daughter        TYPE ll  . Heart Problems Daughter   . Hypertension Sister   . Hypertension Brother   . Stroke Brother      Social History   Substance and Sexual Activity  Drug Use No     Social History   Substance and Sexual Activity  Alcohol Use No   Comment: quit in 1984     Social History   Tobacco Use  Smoking Status Former Smoker  . Packs/day: 2.00  . Years: 14.00  . Pack years: 28.00  . Types: Cigarettes  . Quit date: 11/11/1972  . Years since quitting: 46.9  Smokeless Tobacco Never Used     Outpatient Encounter Medications as of 10/06/2019  Medication Sig  . amiodarone (PACERONE) 200 MG tablet TAKE 1 TABLET BY MOUTH EVERY DAY  . atorvastatin (LIPITOR) 80 MG tablet Take 1 tablet (80 mg total) by mouth at bedtime.  . Blood Glucose Monitoring Suppl (FREESTYLE LITE) DEVI 1 each by Does not apply route 2 (two) times daily.  . Choline Fenofibrate (FENOFIBRIC ACID) 135 MG CPDR TAKE 1 CAPSULE DAILY  . dabigatran (PRADAXA) 150 MG CAPS capsule Take 1 capsule (150 mg total) by mouth every 12 (twelve) hours.  . docusate sodium (COLACE) 100 MG capsule Take 100 mg by mouth 2 (two) times daily.  [name] Kitchen FREESTYLE LITE test strip 1 EACH BY OTHER ROUTE 4 (FOUR) TIMES DAILY - BEFORE MEALS AND AT BEDTIME.  . furosemide (LASIX) 20 MG tablet Take 1 tablet (20 mg total) by mouth daily.  [name] Kitchen glipiZIDE (GLUCOTROL XL) 10 MG 24 hr tablet TAKE 1 TABLET (10 MG TOTAL) BY MOUTH DAILY WITH BREAKFAST.  [name] Kitchen Insulin Degludec (TRESIBA FLEXTOUCH) 200 UNIT/ML SOPN Inject 25 Units into the skin at bedtime.  . insulin lispro (HUMALOG KWIKPEN) 100 UNIT/ML KwikPen Inject 5 Units into the skin 3 (three) times daily as needed.  . Insulin Pen Needle (BD PEN NEEDLE NANO U/F) 32G X 4 MM MISC USE 4 (FOUR) TIMES DAILY.  [name] Kitchen levothyroxine (SYNTHROID) 175 MCG  tablet TAKE 1 TABLET BY MOUTH EVERY DAY  . metoprolol tartrate (LOPRESSOR) 50 MG tablet TAKE ONE AND ONE-HALF TABLET BY MOUTH TWICE A DAY  . mirabegron ER (MYRBETRIQ) 25 MG TB24 tablet Take 1 tablet (25 mg total) by mouth daily.  . pentoxifylline (TRENTAL) 400 MG CR tablet Take 1 tablet (400 mg total) by mouth 3 (three) times daily with meals.  . Evolocumab 140 MG/ML SOSY Inject 140 mg into the skin every 14 (fourteen) days.  . silver sulfADIAZINE (  SILVADENE) 1 % cream Apply 1 application topically daily.  . [DISCONTINUED] insulin lispro (HUMALOG KWIKPEN) 100 UNIT/ML KwikPen Inject 5 Units into the skin 3 (three) times daily. Inject 5 units under the skin three times daily before meals.  . [DISCONTINUED] TRESIBA FLEXTOUCH 200 UNIT/ML SOPN INJECT 66 UNITS INTO THE SKIN AT BEDTIME.   No facility-administered encounter medications on file as of 10/06/2019.     Allergies: Patient has no known allergies.  Body mass index is 33.64 kg/m.  Blood pressure (!) 149/65, pulse 65, temperature 98.5 F (36.9 C), temperature source Oral, height 5\' 11"  (1.803 m), weight 241 lb 3.2 oz (109.4 kg), SpO2 99 %.     Review of Systems  Constitutional: Positive for fatigue. Negative for activity change, appetite change, chills, diaphoresis, fever and unexpected weight change.  HENT: Negative for congestion.   Respiratory: Negative for cough, chest tightness, shortness of breath, wheezing and stridor.   Cardiovascular: Negative for chest pain, palpitations and leg swelling.  Gastrointestinal: Negative for abdominal distention, abdominal pain, blood in stool, constipation, diarrhea, nausea and vomiting.  Endocrine: Negative for polydipsia, polyphagia and polyuria.  Musculoskeletal: Positive for arthralgias, gait problem and myalgias.  Neurological: Negative for headaches.  Hematological: Negative for adenopathy. Does not bruise/bleed easily.  Psychiatric/Behavioral: Negative for behavioral problems,  confusion, decreased concentration, dysphoric mood, hallucinations, self-injury, sleep disturbance and suicidal ideas. The patient is not nervous/anxious and is not hyperactive.        Objective:   Physical Exam Vitals signs and nursing note reviewed.  Constitutional:      General: He is not in acute distress.    Appearance: Normal appearance. He is obese. He is not ill-appearing, toxic-appearing or diaphoretic.  HENT:     Head: Normocephalic and atraumatic.  Cardiovascular:     Rate and Rhythm: Normal rate and regular rhythm.     Pulses: Normal pulses.     Heart sounds: Normal heart sounds.  Pulmonary:     Effort: Pulmonary effort is normal. No respiratory distress.     Breath sounds: Normal breath sounds. No stridor. No wheezing, rhonchi or rales.  Chest:     Chest wall: No tenderness.  Musculoskeletal:        General: Deformity present.     Comments: LLE- Below the knee amputation- prosthetic in place   Skin:    General: Skin is warm and dry.     Capillary Refill: Capillary refill takes less than 2 seconds.  Neurological:     Mental Status: He is alert and oriented to person, place, and time.  Psychiatric:        Mood and Affect: Mood normal.        Behavior: Behavior normal.        Thought Content: Thought content normal.        Judgment: Judgment normal.       Assessment & Plan:   1. Healthcare maintenance   2. Hyperlipidemia, unspecified hyperlipidemia type   3. Uncontrolled type 2 diabetes mellitus with hypoglycemia without coma ([place])   4. Obstructive sleep apnea treated with continuous positive airway pressure (CPAP)   5. Paroxysmal atrial fibrillation ([place])   6. Bradycardia   7. Cardiac pacemaker in situ   8. Obesity with body mass index greater than 30     Healthcare maintenance Continue all medications as directed. Please continue close follow-up with your various specialists. Remain as active as tolerated and continue to use your assistive devices as  needed and for safety- great  job on the daily walking! Continue to social distance and wear a mask when out in public. Please schedule a follow-up in 3 months and come fasting so that labs can be updated.  Hyperlipidemia Atorvastatin 80mg  QD Lipid panel at f/u in 3 months  Type II diabetes mellitus, uncontrolled ([place]) Lab Results  Component Value Date   HGBA1C 7.6 (H) 06/04/2019   HGBA1C 5.9 (A) 11/23/2018   HGBA1C 6.1 (A) 08/19/2018   Endocrinology/Dr. Dwyane Dee Q3M T2D- Glipizide XL 10mg  QD, Tresiba 25 U QHS,  Humalog 5 U TID Hypothyroidism- Levothyroxine 169mcg QD He reports AM upper 60-130s- advised to call Endocrinology with any BG <80  Obstructive sleep apnea treated with continuous positive airway pressure (CPAP) Compliant with nightly CPAP  Paroxysmal atrial fibrillation ([facility]) Pradaxa and amiodarone  Bradycardia  Medtronic dual-chamber pacemaker implanted 11/30/2017  Cardiac pacemaker in situ  Medtronic dual-chamber pacemaker implanted 11/30/2017  Obesity with body mass index greater than 30  Estimated body mass index is 33.64 kg/m as calculated from the following:   Height as of this encounter: 5\' 11"  (1.803 m).   Weight as of this encounter: 241 lb 3.2 oz (109.4 kg).  Chronic kidney disease 05/2019 CMP Creat 1.55 GFR 43     FOLLOW-UP:  Return in about 3 months (around 01/06/2020) for Regular Follow Up, HTN, Hypercholestermia, Diabetes, Obesity, Fasting Labs.

## 2019-10-06 ENCOUNTER — Encounter: Payer: Self-pay | Admitting: Adult Health

## 2019-10-06 ENCOUNTER — Other Ambulatory Visit: Payer: Self-pay

## 2019-10-06 ENCOUNTER — Ambulatory Visit (INDEPENDENT_AMBULATORY_CARE_PROVIDER_SITE_OTHER): Payer: Medicare Other | Admitting: Adult Health

## 2019-10-06 DIAGNOSIS — G4733 Obstructive sleep apnea (adult) (pediatric): Secondary | ICD-10-CM | POA: Diagnosis not present

## 2019-10-06 DIAGNOSIS — R001 Bradycardia, unspecified: Secondary | ICD-10-CM | POA: Diagnosis not present

## 2019-10-06 DIAGNOSIS — Z0000 Encounter for general adult medical examination without abnormal findings: Secondary | ICD-10-CM | POA: Diagnosis not present

## 2019-10-06 DIAGNOSIS — E11649 Type 2 diabetes mellitus with hypoglycemia without coma: Secondary | ICD-10-CM

## 2019-10-06 DIAGNOSIS — I779 Disorder of arteries and arterioles, unspecified: Secondary | ICD-10-CM

## 2019-10-06 DIAGNOSIS — E785 Hyperlipidemia, unspecified: Secondary | ICD-10-CM

## 2019-10-06 DIAGNOSIS — E669 Obesity, unspecified: Secondary | ICD-10-CM | POA: Diagnosis not present

## 2019-10-06 DIAGNOSIS — I480 Paroxysmal atrial fibrillation: Secondary | ICD-10-CM

## 2019-10-06 DIAGNOSIS — Z950 Presence of cardiac pacemaker: Secondary | ICD-10-CM

## 2019-10-06 DIAGNOSIS — Z9989 Dependence on other enabling machines and devices: Secondary | ICD-10-CM

## 2019-10-06 NOTE — Assessment & Plan Note (Signed)
Pradaxa and amiodarone

## 2019-10-06 NOTE — Assessment & Plan Note (Signed)
Compliant with nightly CPAP

## 2019-10-06 NOTE — Patient Instructions (Addendum)
Diabetes Mellitus and Nutrition, Adult When you have diabetes (diabetes mellitus), it is very important to have healthy eating habits because your blood sugar (glucose) levels are greatly affected by what you eat and drink. Eating healthy foods in the appropriate amounts, at about the same times every day, can help you:  Control your blood glucose.  Lower your risk of heart disease.  Improve your blood pressure.  Reach or maintain a healthy weight. Every person with diabetes is different, and each person has different needs for a meal plan. Your health care provider may recommend that you work with a diet and nutrition specialist (dietitian) to make a meal plan that is best for you. Your meal plan may vary depending on factors such as:  The calories you need.  The medicines you take.  Your weight.  Your blood glucose, blood pressure, and cholesterol levels.  Your activity level.  Other health conditions you have, such as heart or kidney disease. How do carbohydrates affect me? Carbohydrates, also called carbs, affect your blood glucose level more than any other type of food. Eating carbs naturally raises the amount of glucose in your blood. Carb counting is a method for keeping track of how many carbs you eat. Counting carbs is important to keep your blood glucose at a healthy level, especially if you use insulin or take certain oral diabetes medicines. It is important to know how many carbs you can safely have in each meal. This is different for every person. Your dietitian can help you calculate how many carbs you should have at each meal and for each snack. Foods that contain carbs include:  Bread, cereal, rice, pasta, and crackers.  Potatoes and corn.  Peas, beans, and lentils.  Milk and yogurt.  Fruit and juice.  Desserts, such as cakes, cookies, ice cream, and candy. How does alcohol affect me? Alcohol can cause a sudden decrease in blood glucose (hypoglycemia),  especially if you use insulin or take certain oral diabetes medicines. Hypoglycemia can be a life-threatening condition. Symptoms of hypoglycemia (sleepiness, dizziness, and confusion) are similar to symptoms of having too much alcohol. If your health care provider says that alcohol is safe for you, follow these guidelines:  Limit alcohol intake to no more than 1 drink per day for nonpregnant women and 2 drinks per day for men. One drink equals 12 oz of beer, 5 oz of wine, or 1 oz of hard liquor.  Do not drink on an empty stomach.  Keep yourself hydrated with water, diet soda, or unsweetened iced tea.  Keep in mind that regular soda, juice, and other mixers may contain a lot of sugar and must be counted as carbs. What are tips for following this plan?  Reading food labels  Start by checking the serving size on the "Nutrition Facts" label of packaged foods and drinks. The amount of calories, carbs, fats, and other nutrients listed on the label is based on one serving of the item. Many items contain more than one serving per package.  Check the total grams (g) of carbs in one serving. You can calculate the number of servings of carbs in one serving by dividing the total carbs by 15. For example, if a food has 30 g of total carbs, it would be equal to 2 servings of carbs.  Check the number of grams (g) of saturated and trans fats in one serving. Choose foods that have low or no amount of these fats.  Check the number of   milligrams (mg) of salt (sodium) in one serving. Most people should limit total sodium intake to less than 2,300 mg per day.  Always check the nutrition information of foods labeled as "low-fat" or "nonfat". These foods may be higher in added sugar or refined carbs and should be avoided.  Talk to your dietitian to identify your daily goals for nutrients listed on the label. Shopping  Avoid buying canned, premade, or processed foods. These foods tend to be high in fat, sodium,  and added sugar.  Shop around the outside edge of the grocery store. This includes fresh fruits and vegetables, bulk grains, fresh meats, and fresh dairy. Cooking  Use low-heat cooking methods, such as baking, instead of high-heat cooking methods like deep frying.  Cook using healthy oils, such as olive, canola, or sunflower oil.  Avoid cooking with butter, cream, or high-fat meats. Meal planning  Eat meals and snacks regularly, preferably at the same times every day. Avoid going long periods of time without eating.  Eat foods high in fiber, such as fresh fruits, vegetables, beans, and whole grains. Talk to your dietitian about how many servings of carbs you can eat at each meal.  Eat 4-6 ounces (oz) of lean protein each day, such as lean meat, chicken, fish, eggs, or tofu. One oz of lean protein is equal to: ? 1 oz of meat, chicken, or fish. ? 1 egg. ?  cup of tofu.  Eat some foods each day that contain healthy fats, such as avocado, nuts, seeds, and fish. Lifestyle  Check your blood glucose regularly.  Exercise regularly as told by your health care provider. This may include: ? 150 minutes of moderate-intensity or vigorous-intensity exercise each week. This could be brisk walking, biking, or water aerobics. ? Stretching and doing strength exercises, such as yoga or weightlifting, at least 2 times a week.  Take medicines as told by your health care provider.  Do not use any products that contain nicotine or tobacco, such as cigarettes and e-cigarettes. If you need help quitting, ask your health care provider.  Work with a Social worker or diabetes educator to identify strategies to manage stress and any emotional and social challenges. Questions to ask a health care provider  Do I need to meet with a diabetes educator?  Do I need to meet with a dietitian?  What number can I call if I have questions?  When are the best times to check my blood glucose? Where to find more  information:  American Diabetes Association: diabetes.org  Academy of Nutrition and Dietetics: www.eatright.[employer] of Diabetes and Digestive and Kidney Diseases (NIH): [employer] Summary  A healthy meal plan will help you control your blood glucose and maintain a healthy lifestyle.  Working with a diet and nutrition specialist (dietitian) can help you make a meal plan that is best for you.  Keep in mind that carbohydrates (carbs) and alcohol have immediate effects on your blood glucose levels. It is important to count carbs and to use alcohol carefully. This information is not intended to replace advice given to you by your health care provider. Make sure you discuss any questions you have with your health care provider. Document Released: 07/25/2005 Document Revised: 10/10/2017 Document Reviewed: 12/02/2016 Elsevier Patient Education  2020 [place] all medications as directed. Please continue close follow-up with your various specialists. Remain as active as tolerated and continue to use your assistive devices as needed and for safety- great job on the daily  walking! Continue to social distance and wear a mask when out in public. Please schedule a follow-up in 3 months and come fasting so that labs can be updated. WELCOME TO THE PRACTICE!

## 2019-10-06 NOTE — Assessment & Plan Note (Signed)
05/2019 CMP Creat 1.55 GFR 43

## 2019-10-06 NOTE — Assessment & Plan Note (Signed)
Atorvastatin 80mg  QD Lipid panel at f/u in 3 months

## 2019-10-06 NOTE — Assessment & Plan Note (Signed)
  Estimated body mass index is 33.64 kg/m as calculated from the following:   Height as of this encounter: 5\' 11"  (1.803 m).   Weight as of this encounter: 241 lb 3.2 oz (109.4 kg).

## 2019-10-06 NOTE — Assessment & Plan Note (Signed)
Continue all medications as directed. Please continue close follow-up with your various specialists. Remain as active as tolerated and continue to use your assistive devices as needed and for safety- great job on the daily walking! Continue to social distance and wear a mask when out in public. Please schedule a follow-up in 3 months and come fasting so that labs can be updated.

## 2019-10-06 NOTE — Assessment & Plan Note (Signed)
Medtronic dual-chamber pacemaker implanted 11/30/2017

## 2019-10-06 NOTE — Assessment & Plan Note (Signed)
Lab Results  Component Value Date   HGBA1C 7.6 (H) 06/04/2019   HGBA1C 5.9 (A) 11/23/2018   HGBA1C 6.1 (A) 08/19/2018   Endocrinology/Dr. Dwyane Dee Q3M T2D- Glipizide XL 10mg  QD, Tresiba 25 U QHS,  Humalog 5 U TID Hypothyroidism- Levothyroxine 118mcg QD He reports AM upper 60-130s- advised to call Endocrinology with any BG <80

## 2019-10-13 DIAGNOSIS — C44319 Basal cell carcinoma of skin of other parts of face: Secondary | ICD-10-CM | POA: Diagnosis not present

## 2019-10-20 DIAGNOSIS — C44319 Basal cell carcinoma of skin of other parts of face: Secondary | ICD-10-CM | POA: Diagnosis not present

## 2019-10-22 NOTE — Progress Notes (Signed)
Remote pacemaker transmission.   

## 2019-10-26 ENCOUNTER — Telehealth: Payer: Self-pay | Admitting: Endocrinology

## 2019-10-26 NOTE — Telephone Encounter (Signed)
Jasmine with [employer] ph# [phone] requests to be called re: status of forms that were faxed to Dr. Dwyane Dee on 10/25/19 (Confirm receipt of fax)

## 2019-10-26 NOTE — Telephone Encounter (Signed)
The form was received this am. All forms have a turn around time of 7-10 business days.

## 2019-10-27 DIAGNOSIS — C44319 Basal cell carcinoma of skin of other parts of face: Secondary | ICD-10-CM | POA: Diagnosis not present

## 2019-10-28 ENCOUNTER — Telehealth: Payer: Self-pay

## 2019-10-28 ENCOUNTER — Other Ambulatory Visit: Payer: Self-pay

## 2019-10-28 MED ORDER — INSULIN LISPRO (1 UNIT DIAL) 100 UNIT/ML (KWIKPEN): PEN_INJECTOR | SUBCUTANEOUS | 1 refills | Status: DC

## 2019-10-28 NOTE — Telephone Encounter (Signed)
Rx sent 

## 2019-10-28 NOTE — Telephone Encounter (Signed)
MEDICATION: insulin lispro (HUMALOG KWIKPEN) 100 UNIT/ML KwikPen  PHARMACY:  [facility] [place], [place] AT Portal to Registered [employer] Sites  IS THIS A 90 DAY SUPPLY :   IS PATIENT OUT OF MEDICATION:   IF NOT; HOW MUCH IS LEFT:   LAST APPOINTMENT DATE: @12 /15/2020  NEXT APPOINTMENT DATE:@Visit  date not found  DO WE HAVE YOUR PERMISSION TO LEAVE A DETAILED MESSAGE:  OTHER COMMENTS:    **Let patient know to contact pharmacy at the end of the day to make sure medication is ready. **  ** Please notify patient to allow 48-72 hours to process**  **Encourage patient to contact the pharmacy for refills or they can request refills through [facility]**

## 2019-10-29 ENCOUNTER — Telehealth: Payer: Self-pay

## 2019-10-29 NOTE — Telephone Encounter (Signed)
Made in error

## 2019-11-07 NOTE — Telephone Encounter (Signed)
Patient did not show up on his last appointment and has not been seen since 7/20.  Needs to make appointment before forms can be completed

## 2019-11-09 NOTE — Telephone Encounter (Signed)
Called pt and spoke with his wife and schedule f/u appt with labs prior.

## 2019-11-09 NOTE — Telephone Encounter (Signed)
Called pt and spoke with pt's wife and scheduled him for lab and f/u appt in January with Dr. Dwyane Dee.

## 2019-12-02 DIAGNOSIS — H40032 Anatomical narrow angle, left eye: Secondary | ICD-10-CM | POA: Diagnosis not present

## 2019-12-02 DIAGNOSIS — H40013 Open angle with borderline findings, low risk, bilateral: Secondary | ICD-10-CM | POA: Diagnosis not present

## 2019-12-02 DIAGNOSIS — H35033 Hypertensive retinopathy, bilateral: Secondary | ICD-10-CM | POA: Diagnosis not present

## 2019-12-02 DIAGNOSIS — E113393 Type 2 diabetes mellitus with moderate nonproliferative diabetic retinopathy without macular edema, bilateral: Secondary | ICD-10-CM | POA: Diagnosis not present

## 2019-12-02 LAB — HM DIABETES EYE EXAM

## 2019-12-03 ENCOUNTER — Other Ambulatory Visit: Payer: Self-pay | Admitting: Endocrinology

## 2019-12-03 ENCOUNTER — Other Ambulatory Visit: Payer: Self-pay

## 2019-12-03 ENCOUNTER — Other Ambulatory Visit (INDEPENDENT_AMBULATORY_CARE_PROVIDER_SITE_OTHER): Payer: Medicare Other

## 2019-12-03 DIAGNOSIS — E1165 Type 2 diabetes mellitus with hyperglycemia: Secondary | ICD-10-CM

## 2019-12-03 DIAGNOSIS — Z794 Long term (current) use of insulin: Secondary | ICD-10-CM | POA: Diagnosis not present

## 2019-12-03 DIAGNOSIS — E782 Mixed hyperlipidemia: Secondary | ICD-10-CM | POA: Diagnosis not present

## 2019-12-03 LAB — HEMOGLOBIN A1C: Hgb A1c MFr Bld: 8 % — ABNORMAL HIGH (ref 4.6–6.5)

## 2019-12-03 LAB — COMPREHENSIVE METABOLIC PANEL WITH GFR
ALT: 16 U/L (ref 0–53)
AST: 21 U/L (ref 0–37)
Albumin: 4.3 g/dL (ref 3.5–5.2)
Alkaline Phosphatase: 31 U/L — ABNORMAL LOW (ref 39–117)
BUN: 30 mg/dL — ABNORMAL HIGH (ref 6–23)
CO2: 26 meq/L (ref 19–32)
Calcium: 9.7 mg/dL (ref 8.4–10.5)
Chloride: 104 meq/L (ref 96–112)
Creatinine, Ser: 1.74 mg/dL — ABNORMAL HIGH (ref 0.40–1.50)
GFR: 38.32 mL/min — ABNORMAL LOW (ref >60.00)
Glucose, Bld: 143 mg/dL — ABNORMAL HIGH (ref 70–99)
Potassium: 4.3 meq/L (ref 3.5–5.1)
Sodium: 141 meq/L (ref 135–145)
Total Bilirubin: 0.5 mg/dL (ref 0.2–1.2)
Total Protein: 7.2 g/dL (ref 6.0–8.3)

## 2019-12-03 LAB — LDL CHOLESTEROL, DIRECT: Direct LDL: 123 mg/dL

## 2019-12-03 LAB — MICROALBUMIN / CREATININE URINE RATIO
Creatinine,U: 172.7 mg/dL
Microalb Creat Ratio: 1.7 mg/g (ref 0.0–30.0)
Microalb, Ur: 3 mg/dL — ABNORMAL HIGH (ref 0.0–1.9)

## 2019-12-06 ENCOUNTER — Other Ambulatory Visit: Payer: Self-pay

## 2019-12-07 ENCOUNTER — Other Ambulatory Visit: Payer: Self-pay

## 2019-12-07 ENCOUNTER — Ambulatory Visit (INDEPENDENT_AMBULATORY_CARE_PROVIDER_SITE_OTHER): Payer: Medicare Other | Admitting: Endocrinology

## 2019-12-07 ENCOUNTER — Encounter: Payer: Self-pay | Admitting: Endocrinology

## 2019-12-07 VITALS — BP 110/50 | HR 68 | Ht 71.0 in | Wt 246.0 lb

## 2019-12-07 DIAGNOSIS — E782 Mixed hyperlipidemia: Secondary | ICD-10-CM

## 2019-12-07 DIAGNOSIS — E1165 Type 2 diabetes mellitus with hyperglycemia: Secondary | ICD-10-CM | POA: Diagnosis not present

## 2019-12-07 DIAGNOSIS — E039 Hypothyroidism, unspecified: Secondary | ICD-10-CM | POA: Diagnosis not present

## 2019-12-07 DIAGNOSIS — Z794 Long term (current) use of insulin: Secondary | ICD-10-CM | POA: Diagnosis not present

## 2019-12-07 MED ORDER — EZETIMIBE 10 MG PO TABS: 10.0000 mg | ORAL_TABLET | Freq: Every day | ORAL | 3 refills | Status: DC

## 2019-12-07 MED ORDER — TRESIBA FLEXTOUCH 200 UNIT/ML ~~LOC~~ SOPN: 25.0000 [IU] | PEN_INJECTOR | Freq: Every day | SUBCUTANEOUS | 1 refills | Status: DC

## 2019-12-07 MED ORDER — INSULIN LISPRO (1 UNIT DIAL) 100 UNIT/ML (KWIKPEN): PEN_INJECTOR | SUBCUTANEOUS | 1 refills | Status: DC

## 2019-12-07 NOTE — Progress Notes (Signed)
Patient ID: Steven Maldonado, male   DOB: August 01, 1941, 79 y.o.   MRN: [id number]          Reason for Appointment: Follow-up for Type 2 Diabetes  Today's office visit was provided via telemedicine using a telephone call to the patient Patient has been explained the limitations of evaluation and management by telemedicine and the availability of in person appointments.  The patient understood the limitations and agreed to proceed. Patient also understood that the telehealth visit is billable. . Location of the patient: Home . Location of the provider: Office Only the patient and myself were participating in the encounter     History of Present Illness:          Date of diagnosis of type 2 diabetes mellitus:  1999      Background history:   He had been treated with metformin and glipizide for several years Metformin was stopped about 4 years ago because of renal dysfunction Presumably because of poor control he was started on insulin around the year 2009 He had been mostly on Lantus which was subsequently changed to [facility] and he thinks that Lantus worked better Also has been on Humalog 3-4 times a day for some time  Recent history:   Most recent A1c is 7.6, previously 5.9, has been as high as 11.6 in June 2019  INSULIN regimen is:  TRESIBA 25 at bedtime, Humalog, none  Non-insulin hypoglycemic drugs the patient is taking are: None  Current management, blood sugar patterns and problems identified:  He has been using the freestyle libre system since about February 2020  His insulin regimen, day-to-day adjustments, diet, blood sugar patterns and problems identified are discussed in the CGM interpretation below  He thinks he has been somewhat lax with his diet at times and eating larger portions are more carbohydrate at times  His weight is up 5 pounds  He has not been able to do his walking which he likes to do when he can go out  He takes 5 units of Humalog with meals and  no adjustment for what he is eating  Also he thinks he is taking 5 units at bedtime especially since blood sugars are relatively higher at that time  However his blood sugars are generally low normal or low overnight and he may take glucose tablets when symptomatic        Side effects from medications have been: None     Meal times are:  Breakfast is at 8 AM usually             CONTINUOUS GLUCOSE MONITORING RECORD INTERPRETATION    Dates of Recording: 1/13 through 1/26  Sensor description: Elenor Legato  Results statistics:   CGM use % of time  64  Average and SD  142, GV 36  Time in range  63      %  % Time Above 180  27  % Time above 250   % Time Below target  9     PRE-MEAL Fasting Lunch Dinner Bedtime Overall  Glucose range:       Mean/median:  85 ?  173   189  142   POST-MEAL PC Breakfast PC Lunch PC Dinner  Glucose range:     Mean/median:  115   176    Glycemic patterns summary: His freestyle libre is inaccurate and is reading falsely low, compared to the lab glucose it is about 25 mg lower Also at times when he has compared it  with his fingersticks up to 30 mg no  Blood sugar patterns are incomplete with inadequate data between 10 AM and 3:30 PM His blood sugars are low normal or low early morning on his Elenor Legato but they tend to be higher mostly after his late afternoon or evening meals   Hyperglycemic episodes are occurring primarily in the evenings or afternoons with his meals and once midday possibly from forgetting his Humalog  Hypoglycemic episodes appear to be mostly overnight and early morning with blood sugars as low as 40  Overnight periods: Blood sugars are averaging about 190 at midnight and decreased progressively to an average of only 85 after 8 a.m. with some hypoglycemia indicated  Preprandial periods: Blood sugars are low normal at breakfast, not clearly documented at lunchtime and appear to be mostly about 160-170 at dinnertime  Postprandial  periods:   After breakfast:   Blood sugars are usually not rising but data is incomplete After lunch: He may not eat this meal at times After dinner which may be between 3-6 PM his blood sugars are mostly higher with mild variability, however blood sugars tend to rise even further after 8 PM:   Previous data:  PRE-MEAL Fasting Lunch Dinner Bedtime Overall  Glucose range:  109      Mean/median:     142  122   POST-MEAL PC Breakfast PC Lunch PC Dinner  Glucose range:     Mean/median:  122  134  139      Dietician visit, most recent: 9/19  Weight history:  Wt Readings from Last 3 Encounters:  12/07/19 246 lb (111.6 kg)  10/06/19 241 lb 3.2 oz (109.4 kg)  05/31/19 243 lb (110.2 kg)    Glycemic control:   Lab Results  Component Value Date   HGBA1C 8.0 (H) 12/03/2019   HGBA1C 7.6 (H) 06/04/2019   HGBA1C 5.9 (A) 11/23/2018   Lab Results  Component Value Date   MICROALBUR 3.0 (H) 12/03/2019   LDLCALC 142 (H) 05/31/2019   CREATININE 1.74 (H) 12/03/2019   Lab Results  Component Value Date   MICRALBCREAT 1.7 12/03/2019    Lab Results  Component Value Date   FRUCTOSAMINE 268 06/26/2018    Abstract on 12/03/2019  Component Date Value Ref Range Status  . HM Diabetic Eye Exam 12/02/2019 Retinopathy* No Retinopathy Final  Lab on 12/03/2019  Component Date Value Ref Range Status  . Direct LDL 12/03/2019 123.0  mg/dL Final   Optimal:  <100 mg/dLNear or Above Optimal:  100-129 mg/dLBorderline High:  130-159 mg/dLHigh:  160-189 mg/dLVery High:  >190 mg/dL  . Microalb, Ur 12/03/2019 3.0* 0.0 - 1.9 mg/dL Final  . Creatinine,U 12/03/2019 172.7  mg/dL Final  . Microalb Creat Ratio 12/03/2019 1.7  0.0 - 30.0 mg/g Final  . Sodium 12/03/2019 141  135 - 145 mEq/L Final  . Potassium 12/03/2019 4.3  3.5 - 5.1 mEq/L Final  . Chloride 12/03/2019 104  96 - 112 mEq/L Final  . CO2 12/03/2019 26  19 - 32 mEq/L Final  . Glucose, Bld 12/03/2019 143* 70 - 99 mg/dL Final  . BUN  12/03/2019 30* 6 - 23 mg/dL Final  . Creatinine, Ser 12/03/2019 1.74* 0.40 - 1.50 mg/dL Final  . Total Bilirubin 12/03/2019 0.5  0.2 - 1.2 mg/dL Final  . Alkaline Phosphatase 12/03/2019 31* 39 - 117 U/L Final  . AST 12/03/2019 21  0 - 37 U/L Final  . ALT 12/03/2019 16  0 - 53 U/L Final  . Total  Protein 12/03/2019 7.2  6.0 - 8.3 g/dL Final  . Albumin 12/03/2019 4.3  3.5 - 5.2 g/dL Final  . GFR 12/03/2019 38.32* >60.00 mL/min Final  . Calcium 12/03/2019 9.7  8.4 - 10.5 mg/dL Final  . Hgb A1c MFr Bld 12/03/2019 8.0* 4.6 - 6.5 % Final   Glycemic Control Guidelines for People with Diabetes:Non Diabetic:  <6%Goal of Therapy: <7%Additional Action Suggested:  >8%     Allergies as of 12/07/2019   No Known Allergies     Medication List       Accurate as of December 07, 2019  4:53 PM. If you have any questions, ask your nurse or doctor.        amiodarone 200 MG tablet Commonly known as: PACERONE TAKE 1 TABLET BY MOUTH EVERY DAY   atorvastatin 80 MG tablet Commonly known as: LIPITOR Take 1 tablet (80 mg total) by mouth at bedtime.   BD Pen Needle Nano U/F 32G X 4 MM Misc Generic drug: Insulin Pen Needle USE 4 (FOUR) TIMES DAILY.   dabigatran 150 MG Caps capsule Commonly known as: Pradaxa Take 1 capsule (150 mg total) by mouth every 12 (twelve) hours.   docusate sodium 100 MG capsule Commonly known as: COLACE Take 100 mg by mouth 2 (two) times daily.   Evolocumab 140 MG/ML Sosy Inject 140 mg into the skin every 14 (fourteen) days.   Fenofibric Acid 135 MG Cpdr TAKE 1 CAPSULE DAILY   FreeStyle Lite Devi 1 each by Does not apply route 2 (two) times daily.   FREESTYLE LITE test strip Generic drug: glucose blood 1 EACH BY OTHER ROUTE 4 (FOUR) TIMES DAILY - BEFORE MEALS AND AT BEDTIME.   furosemide 20 MG tablet Commonly known as: LASIX Take 1 tablet (20 mg total) by mouth daily.   glipiZIDE 10 MG 24 hr tablet Commonly known as: GLUCOTROL XL TAKE 1 TABLET (10 MG TOTAL) BY  MOUTH DAILY WITH BREAKFAST.   insulin lispro 100 UNIT/ML KwikPen Commonly known as: HumaLOG KwikPen Inject 4-6 units under the skin up to three times daily if eating sweets or high carb foods.   levothyroxine 175 MCG tablet Commonly known as: SYNTHROID TAKE 1 TABLET BY MOUTH EVERY DAY   metoprolol tartrate 50 MG tablet Commonly known as: LOPRESSOR TAKE ONE AND ONE-HALF TABLET BY MOUTH TWICE A DAY   mirabegron ER 25 MG Tb24 tablet Commonly known as: Myrbetriq Take 1 tablet (25 mg total) by mouth daily.   pentoxifylline 400 MG CR tablet Commonly known as: TRENTAL Take 1 tablet (400 mg total) by mouth 3 (three) times daily with meals.   silver sulfADIAZINE 1 % cream Commonly known as: Silvadene Apply 1 application topically daily.   [name] FlexTouch 200 UNIT/ML Sopn Generic drug: Insulin Degludec Inject 26 Units into the skin at bedtime. What changed: how much to take Changed by: Jayme Cloud, LPN       Allergies: No Known Allergies  Past Medical History:  Diagnosis Date  . Chronic diastolic CHF (congestive heart failure) ([place])   . CKD (chronic kidney disease), stage III   . Constipation   . Coronary artery disease    a. Cath February 2012 in [place], occluded RCA with collaterals  . DM type 2 (diabetes mellitus, type 2) ([place])   . Essential hypertension   . Hyperlipidemia   . Neuropathy    feet  . Pacemaker    a. symptomatic brady after TAVR s/p MDT PPM by Dr. Curt Bears 12/04/17  .  Persistent atrial fibrillation ([place])   . PONV (postoperative nausea and vomiting)    after valve surgery  . PVD (peripheral vascular disease) ([place])    a. s/p R popliteal artery stenosis tx with drug-coated balloon 05/2014, followed by Dr. Fletcher Anon.  . 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 Severe aortic stenosis    a. 12/02/17: s/p TAVR  . Skin cancer   . Sleep  apnea with use of continuous positive airway pressure (CPAP)    04-11-11 AHI was 32.9 and titrated to 15 cm H20, DME is [facility]  . Subclinical hypothyroidism     Past Surgical History:  Procedure Laterality Date  . ABDOMINAL ANGIOGRAM N/A 06/08/2014   Procedure: ABDOMINAL ANGIOGRAM;  Surgeon: Wellington Hampshire, MD;  Location[facility] CATH LAB;  Service: Cardiovascular;  Laterality: N/A;  . ABDOMINAL AORTOGRAM N/A 04/09/2018   Procedure: ABDOMINAL AORTOGRAM;  Surgeon: Conrad Brackettville, MD;  Location: [place] CV LAB;  Service: Cardiovascular;  Laterality: N/A;  . AMPUTATION Left 04/17/2018   Procedure: LEFT FOOT 3RD RAY AMPUTATION;  Surgeon: Newt Minion, MD;  Location: [place];  Service: Orthopedics;  Laterality: Left;  . AMPUTATION Left 05/28/2018   Procedure: LEFT AMPUTATION BELOW KNEE;  Surgeon: Wylene Simmer, MD;  Location: [place];  Service: Orthopedics;  Laterality: Left;  . APPENDECTOMY  1965  . 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 CARDIAC CATHETERIZATION  12/2010  . CARDIOVERSION  07/2011  . CARDIOVERSION N/A 04/18/2014   Procedure: CARDIOVERSION;  Surgeon: Dorothy Spark, MD;  Location: [place];  Service: Cardiovascular;  Laterality: N/A;  . CARDIOVERSION N/A 11/03/2015   Procedure: CARDIOVERSION;  Surgeon: Lelon Perla, MD;  Location: [facility] ENDOSCOPY;  Service: Cardiovascular;  Laterality: N/A;  . CARDIOVERSION N/A 05/08/2017   Procedure: CARDIOVERSION;  Surgeon: Dorothy Spark, MD;  Location: [facility] ENDOSCOPY;  Service: Cardiovascular;  Laterality: N/A;  . CARDIOVERSION N/A 07/28/2017   Procedure: CARDIOVERSION;  Surgeon: Dorothy Spark, MD;  Location: [place];  Service: Cardiovascular;  Laterality: N/A;  . LOWER EXTREMITY ANGIOGRAM N/A 06/08/2014   Procedure: LOWER EXTREMITY ANGIOGRAM;  Surgeon: Wellington Hampshire, MD;  Location: [facility] CATH LAB;  Service: Cardiovascular;  Laterality: N/A;  . LOWER EXTREMITY ANGIOGRAPHY Left 04/09/2018   Procedure: Lower Extremity Angiography;   Surgeon: Conrad Mellette, MD;  Location: [place] CV LAB;  Service: Cardiovascular;  Laterality: Left;  . PACEMAKER IMPLANT N/A 12/04/2017   Procedure: PACEMAKER IMPLANT;  Surgeon: Constance Haw, MD;  Location: [place] CV LAB;  Service: Cardiovascular;  Laterality: N/A;  . PERIPHERAL VASCULAR BALLOON ANGIOPLASTY Left 04/09/2018   Procedure: PERIPHERAL VASCULAR BALLOON ANGIOPLASTY;  Surgeon: Conrad Holyrood, MD;  Location: [place] CV LAB;  Service: Cardiovascular;  Laterality: Left;  SFA  . POPLITEAL ARTERY ANGIOPLASTY Right 06/08/2014   Archie Endo 06/08/2014  . RIGHT/LEFT HEART CATH AND CORONARY ANGIOGRAPHY N/A 10/08/2017   Procedure: RIGHT/LEFT HEART CATH AND CORONARY ANGIOGRAPHY;  Surgeon: Burnell Blanks, MD;  Location: [place] CV LAB;  Service: Cardiovascular;  Laterality: N/A;  . SKIN CANCER EXCISION Bilateral    "have had them cut off back of neck X 2; off left upper arm; right wrist, near right shoulder blade" (06/08/2014)  . TEE WITHOUT CARDIOVERSION N/A 12/02/2017   Procedure: TRANSESOPHAGEAL ECHOCARDIOGRAM (TEE);  Surgeon: Burnell Blanks, MD;  Location: [place];  Service: Open Heart Surgery;  Laterality: N/A;  . TEMPORARY PACEMAKER N/A 12/04/2017   Procedure: TEMPORARY PACEMAKER;  Surgeon: Leonie Man, MD;  Location: [place] CV LAB;  Service: Cardiovascular;  Laterality: N/A;  . TRANSCATHETER AORTIC VALVE REPLACEMENT, TRANSFEMORAL N/A 12/02/2017   Procedure: TRANSCATHETER AORTIC VALVE REPLACEMENT, TRANSFEMORAL;  Surgeon: Burnell Blanks, MD;  Location: [place];  Service: Open Heart Surgery;  Laterality: N/A;  using Edwards Sapien 3 Transcatheter Heart Valve size 86mm    Family History  Problem Relation Age of Onset  . Diabetes Mother   . Heart attack Mother   . Hypertension Mother   . Heart attack Father   . Heart failure Father   . Hypertension Father   . Diabetes Father   . Diabetes Sister   . Diabetes Brother   . Diabetes Other   .  Diabetes Daughter        TYPE ll  . Heart Problems Daughter   . Hypertension Sister   . Hypertension Brother   . Stroke Brother     Social History:  reports that he quit smoking about 47 years ago. His smoking use included cigarettes. He has a 28.00 pack-year smoking history. He has never used smokeless tobacco. He reports that he does not drink alcohol or use drugs.   Review of Systems   Lipid history: He is taking 80 mg atorvastatin without adequate control, high-dose with history of CAD He is not sure if he is taking his medication regularly although it is still on his list prescribed by another PCP and LDL is 123    Lab Results  Component Value Date   CHOL 219 (H) 05/31/2019   CHOL 131 08/18/2018   CHOL 155 10/13/2017   Lab Results  Component Value Date   HDL 36 (L) 05/31/2019   HDL 30.30 (L) 08/18/2018   HDL 38 (L) 10/13/2017   Lab Results  Component Value Date   LDLCALC 142 (H) 05/31/2019   LDLCALC 93 10/13/2017   LDLCALC 95 05/01/2017   Lab Results  Component Value Date   TRIG 264 (H) 05/31/2019   TRIG 219.0 (H) 08/18/2018   TRIG 144 10/13/2017   Lab Results  Component Value Date   CHOLHDL 6.1 (H) 05/31/2019   CHOLHDL 4 08/18/2018   CHOLHDL 4.1 10/13/2017   Lab Results  Component Value Date   LDLDIRECT 123.0 12/03/2019   LDLDIRECT 77.0 08/18/2018            Hypertension: Has been controlled with only antihypertensive being metoprolol, not on ACE inhibitor  BP Readings from Last 3 Encounters:  12/07/19 (!) 110/50  10/06/19 (!) 149/65  05/31/19 124/62    Most recent eye exam was in 12/18 ?  Findings  Most recent foot exam: 8/19  HYPOTHYROIDISM: Because of his high TSH in August 2019 his levothyroxine was increased up to 175 mcg daily TSH has been subsequently normal, not recently checked by PCP  Lab Results  Component Value Date   TSH 3.77 01/25/2019   TSH 2.48 08/18/2018   TSH 22.02 (H) 06/26/2018   FREET4 0.98 01/25/2019   FREET4 1.44  08/18/2018   FREET4 0.92 06/26/2018    Peripheral vascular disease, 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 History of symptomatic neuropathy  He has chronic kidney disease of unclear etiology, is followed by nephrologist periodically   Lab Results  Component Value Date   CREATININE 1.74 (H) 12/03/2019   CREATININE 1.55 (H) 05/31/2019   CREATININE 1.42 01/25/2019  Physical Examination:  BP (!) 110/50 (BP Location: Left Arm, Patient Position: Sitting, Cuff Size: Normal)   Pulse 68   Ht 5\' 11"  (1.803 m)   Wt 246 lb (111.6 kg)   SpO2 97%   BMI 34.31 kg/m          ASSESSMENT:  Diabetes type 2, insulin requiring with moderate obesity  See history of present illness for detailed discussion of current diabetes management, blood sugar patterns and problems identified  A1c is higher at 8%  Hyperglycemia is mostly related to postprandial increase in blood sugar from inadequate mealtime dose especially at his main meal in the late afternoon He is likely needing less basal insulin as discussed above  CKD: Stable, to follow-up with nephrologist as scheduled   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: His TSH will need to be checked again on the next visit  Hyperlipidemia: LDL is unusually high and not clear if his compliance with atorvastatin and regular He will check on this but likely needs additional treatment     PLAN:    Continue using freestyle libre but switch to the version 2, discussed that this will be more accurate and will alert him for any high or low sugars  Tresiba 22 units daily  No Humalog to be taken at bedtime  He will take at least 6 units and preferably 10 units at suppertime if he is eating larger meals or more carbohydrate/fat  He will try to keep his postprandial readings at least under 180 after dinner  Check readings after breakfast and lunch consistently  Start Zetia in addition to atorvastatin which he needs  to also confirm that he is taking  Patient Instructions  Take 22 Tresiba daily  NO HUMALOG AT BEDTIME  TAKE HUMALOG 6-10 UNITS AT SUPPER BASED ON meal size and amount of Carbs  Stay on 5 units at other meals  Check blood sugars on waking up 7 days a week  Also check blood sugars about 2 hours after meals and do this after every meals also  Recommended blood sugar levels on waking up are 90-130 and about 2 hours after meal is 130-180  Please bring your blood sugar monitor to each visit, thank you  Take atorvastatin daily with new Rx    Total duration of phone call = 12 minutes   Elayne Snare 12/07/2019, 4:53 PM   Note: This office note was prepared with Dragon voice recognition system technology. Any transcriptional errors that result from this process are unintentional.

## 2019-12-07 NOTE — Patient Instructions (Addendum)
Take 22 Tresiba daily  NO HUMALOG AT BEDTIME  TAKE HUMALOG 6-10 UNITS AT SUPPER BASED ON meal size and amount of Carbs  Stay on 5 units at other meals  Check blood sugars on waking up 7 days a week  Also check blood sugars about 2 hours after meals and do this after every meals also  Recommended blood sugar levels on waking up are 90-130 and about 2 hours after meal is 130-180  Please bring your blood sugar monitor to each visit, thank you  Take atorvastatin daily with new Rx

## 2019-12-08 ENCOUNTER — Other Ambulatory Visit: Payer: Self-pay | Admitting: Endocrinology

## 2019-12-27 ENCOUNTER — Ambulatory Visit (INDEPENDENT_AMBULATORY_CARE_PROVIDER_SITE_OTHER): Payer: Medicare Other | Admitting: *Deleted

## 2019-12-27 DIAGNOSIS — I480 Paroxysmal atrial fibrillation: Secondary | ICD-10-CM

## 2019-12-28 LAB — CUP PACEART REMOTE DEVICE CHECK
Battery Remaining Longevity: 107 mo
Battery Voltage: 2.94 V
Brady Statistic AP VP Percent: 48.75 %
Brady Statistic AP VS Percent: 0.01 %
Brady Statistic AS VP Percent: 51.08 %
Brady Statistic AS VS Percent: 0.15 %
Brady Statistic RA Percent Paced: 48.79 %
Brady Statistic RV Percent Paced: 99.84 %
Date Time Interrogation Session: 20210216021155
Implantable Lead Implant Date: 20190124
Implantable Lead Implant Date: 20190124
Implantable Lead Location: 753859
Implantable Lead Location: 753860
Implantable Lead Model: 5076
Implantable Lead Model: 5076
Implantable Pulse Generator Implant Date: 20190124
Lead Channel Impedance Value: 342 Ohm
Lead Channel Impedance Value: 380 Ohm
Lead Channel Impedance Value: 380 Ohm
Lead Channel Impedance Value: 456 Ohm
Lead Channel Pacing Threshold Amplitude: 0.625 V
Lead Channel Pacing Threshold Amplitude: 0.625 V
Lead Channel Pacing Threshold Pulse Width: 0.4 ms
Lead Channel Pacing Threshold Pulse Width: 0.4 ms
Lead Channel Sensing Intrinsic Amplitude: 10.75 mV
Lead Channel Sensing Intrinsic Amplitude: 10.75 mV
Lead Channel Sensing Intrinsic Amplitude: 2 mV
Lead Channel Sensing Intrinsic Amplitude: 2 mV
Lead Channel Setting Pacing Amplitude: 2 V
Lead Channel Setting Pacing Amplitude: 2.5 V
Lead Channel Setting Pacing Pulse Width: 0.4 ms
Lead Channel Setting Sensing Sensitivity: 4 mV

## 2019-12-28 NOTE — Progress Notes (Signed)
PPM Remote  

## 2020-01-06 ENCOUNTER — Ambulatory Visit: Payer: Medicare Other | Admitting: Adult Health

## 2020-01-06 ENCOUNTER — Other Ambulatory Visit: Payer: Self-pay | Admitting: *Deleted

## 2020-01-06 DIAGNOSIS — N183 Chronic kidney disease, stage 3 unspecified: Secondary | ICD-10-CM | POA: Diagnosis not present

## 2020-01-06 NOTE — Patient Outreach (Signed)
[facility]) Care Management  01/06/2020  [name] Jul 05, 1941 [id number]   RN Health Coach attempted follow up outreach call to patient.  Patient was unavailable. HIPPA compliance voicemail message left with return callback number.  Plan: RN will call patient again within 30 days.  [place] Care Management [phone]

## 2020-01-10 NOTE — Patient Outreach (Signed)
[facility]) Care Management  [facility] Care Manager  [id number] Late entry  Steven Maldonado [birth year] [id number]  RN Health Coach received return  telephone call from patient wife.  [employer] compliance verified. Per wife the patient has been doing pretty good. The patient A1C is 8.0 this has increased. The patient stated his fasting blood sugar is 92. Patient is using the free style Libre 2. Per wife the patient has been eating fast food out and he has not been exercising as much since the weather has gotten colder. Per wife he was walking everyday. The patient has had his eye exam 12/02/2019.The patient has received both COVID vaccines. Patient has agreed to follow up outreach calls  Encounter Medications:  Outpatient Encounter Medications as of 01/06/2020  Medication Sig  . amiodarone (PACERONE) 200 MG tablet TAKE 1 TABLET BY MOUTH EVERY DAY  . atorvastatin (LIPITOR) 80 MG tablet Take 1 tablet (80 mg total) by mouth at bedtime.  . BD PEN NEEDLE NANO 2ND GEN 32G X 4 MM MISC USE 4 (FOUR) TIMES DAILY.  [name] Kitchen Blood Glucose Monitoring Suppl (FREESTYLE LITE) DEVI 1 each by Does not apply route 2 (two) times daily.  . Choline Fenofibrate (FENOFIBRIC ACID) 135 MG CPDR TAKE 1 CAPSULE DAILY  . dabigatran (PRADAXA) 150 MG CAPS capsule Take 1 capsule (150 mg total) by mouth every 12 (twelve) hours.  . docusate sodium (COLACE) 100 MG capsule Take 100 mg by mouth 2 (two) times daily.  . Evolocumab 140 MG/ML SOSY Inject 140 mg into the skin every 14 (fourteen) days.  [name] Kitchen ezetimibe (ZETIA) 10 MG tablet Take 1 tablet (10 mg total) by mouth daily.  [name] Kitchen FREESTYLE LITE test strip 1 EACH BY OTHER ROUTE 4 (FOUR) TIMES DAILY - BEFORE MEALS AND AT BEDTIME.  . furosemide (LASIX) 20 MG tablet Take 1 tablet (20 mg total) by mouth daily.  [name] Kitchen glipiZIDE (GLUCOTROL XL) 10 MG 24 hr tablet TAKE 1 TABLET (10 MG TOTAL) BY MOUTH DAILY WITH BREAKFAST.  [name] Kitchen Insulin Degludec (TRESIBA FLEXTOUCH) 200 UNIT/ML SOPN Inject 26 Units  into the skin at bedtime.  . insulin lispro (HUMALOG KWIKPEN) 100 UNIT/ML KwikPen Inject 4-6 units under the skin up to three times daily if eating sweets or high carb foods.  [name] Kitchen levothyroxine (SYNTHROID) 175 MCG tablet TAKE 1 TABLET BY MOUTH EVERY DAY  . metoprolol tartrate (LOPRESSOR) 50 MG tablet TAKE ONE AND ONE-HALF TABLET BY MOUTH TWICE A DAY  . mirabegron ER (MYRBETRIQ) 25 MG TB24 tablet Take 1 tablet (25 mg total) by mouth daily.  . pentoxifylline (TRENTAL) 400 MG CR tablet Take 1 tablet (400 mg total) by mouth 3 (three) times daily with meals.  . silver sulfADIAZINE (SILVADENE) 1 % cream Apply 1 application topically daily.   No facility-administered encounter medications on file as of 01/06/2020.    Functional Status:  In your present state of health, do you have any difficulty performing the following activities: 01/06/2020 10/06/2019  Hearing? N N  Vision? N N  Difficulty concentrating or making decisions? N N  Walking or climbing stairs? N Y  Comment lt amb has a prosthesis -  Dressing or bathing? N N  Comment - -  Doing errands, shopping? N N  Preparing Food and eating ? Y -  Comment per wife they eat out or she fixes it -  Using the Toilet? N -  In the past six months, have you accidently leaked urine? N -  Do you have problems with  loss of bowel control? N -  Managing your Medications? - -  Managing your Finances? N -  Housekeeping or managing your Housekeeping? Y -  Comment wife handles all housekeeping -  Some recent data might be hidden    Fall/Depression Screening: Fall Risk  01/06/2020 10/06/2019 07/02/2019  Falls in the past year? 1 0 1  Number falls in past yr: 1 - 0  Injury with Fall? 0 - 0  Risk for fall due to : History of fall(s);Impaired balance/gait;Impaired mobility Orthopedic patient;Impaired mobility History of fall(s);Impaired balance/gait;Impaired mobility  Follow up Falls evaluation completed;Falls prevention discussed Falls evaluation completed  Falls evaluation completed;Education provided;Falls prevention discussed   PHQ 2/9 Scores 01/06/2020 10/06/2019 07/02/2019 12/14/2018 10/07/2018 07/28/2018 06/29/2018  PHQ - 2 Score 0 0 0 0 0 0 0  PHQ- 9 Score - 1 - - - - -   THN CM Care Plan Problem One     Most Recent Value  Care Plan Problem One  Knowledge Deficit in Self Management of diabetes  Role Documenting the Problem One  [place] for Problem One  Active  THN Long Term Goal   Patient will verbalize a decrease in A1C from 8.0 within the next 90 days  THN Long Term Goal Start Date  01/06/20  Interventions for Problem One Long Term Goal  RN discussed the increase in the patient A1C to 8.0. RN discussed adhering to his diet and restarting his exercise routine. RN will follow up with further discussion and encouragement  THN CM Short Term Goal #1   Patient will verbalize making healthier choices dining out within the next 30 days  THN CM Short Term Goal #1 Start Date  01/06/20  Interventions for Short Term Goal #1  RN discussed not adhering to diet. RN discussed that eatin out you can still make healthy choices and as wife explained he wants to eat out get everything on his burgers.RNwill follow up with further discussion .  THN CM Short Term Goal #2   Patient will be available verbalize making better choices of snacks within the next 90 days  Interventions for Short Term Goal #2  RN reiterates healthy snacks. Since COVID has made patient more house bound. The snacks  are not always as healthy as before. Patient needs encouragement. His wife is a good advocate trying to help the patient do better.       Assessment:  Patient fasting blood sugar is 92 A1C is 8.0. It has increased from 7.6 Patient has not been adhering to is diet Patient has not been doing his exercise routine Patient has received flu shot,shingle and COVID vaccines Patient received eye exam [id number]  Plan:  RN sent a 2021 calendar book RN discussed the  [employer] exercise program available on his computer or phone RN discussed the importance of an exercise routine and the progress he had made RN discussed adhering to is diet RN reminded patient and wife that he needs to monitor his blood sugars as per Dr order RN will follow up within the month of May  Marlin Jarrard [place] Management [phone]

## 2020-01-13 DIAGNOSIS — E559 Vitamin D deficiency, unspecified: Secondary | ICD-10-CM | POA: Diagnosis not present

## 2020-01-13 DIAGNOSIS — E1122 Type 2 diabetes mellitus with diabetic chronic kidney disease: Secondary | ICD-10-CM | POA: Diagnosis not present

## 2020-01-13 DIAGNOSIS — I2510 Atherosclerotic heart disease of native coronary artery without angina pectoris: Secondary | ICD-10-CM | POA: Diagnosis not present

## 2020-01-13 DIAGNOSIS — I129 Hypertensive chronic kidney disease with stage 1 through stage 4 chronic kidney disease, or unspecified chronic kidney disease: Secondary | ICD-10-CM | POA: Diagnosis not present

## 2020-01-13 DIAGNOSIS — N1832 Chronic kidney disease, stage 3b: Secondary | ICD-10-CM | POA: Diagnosis not present

## 2020-01-13 DIAGNOSIS — I5032 Chronic diastolic (congestive) heart failure: Secondary | ICD-10-CM | POA: Diagnosis not present

## 2020-01-13 DIAGNOSIS — I350 Nonrheumatic aortic (valve) stenosis: Secondary | ICD-10-CM | POA: Diagnosis not present

## 2020-01-14 ENCOUNTER — Ambulatory Visit: Payer: Medicare Other | Admitting: Family Medicine

## 2020-01-24 ENCOUNTER — Encounter: Payer: Self-pay | Admitting: Family Medicine

## 2020-01-24 ENCOUNTER — Telehealth: Payer: Self-pay | Admitting: *Deleted

## 2020-01-24 ENCOUNTER — Other Ambulatory Visit: Payer: Self-pay

## 2020-01-24 ENCOUNTER — Ambulatory Visit (INDEPENDENT_AMBULATORY_CARE_PROVIDER_SITE_OTHER): Payer: Medicare Other | Admitting: Family Medicine

## 2020-01-24 ENCOUNTER — Ambulatory Visit (INDEPENDENT_AMBULATORY_CARE_PROVIDER_SITE_OTHER): Payer: Medicare Other | Admitting: *Deleted

## 2020-01-24 VITALS — BP 121/77 | HR 63 | Temp 97.6°F | Resp 16 | Ht 71.0 in | Wt 255.6 lb

## 2020-01-24 DIAGNOSIS — I480 Paroxysmal atrial fibrillation: Secondary | ICD-10-CM

## 2020-01-24 DIAGNOSIS — N1832 Chronic kidney disease, stage 3b: Secondary | ICD-10-CM | POA: Diagnosis not present

## 2020-01-24 DIAGNOSIS — E1159 Type 2 diabetes mellitus with other circulatory complications: Secondary | ICD-10-CM | POA: Diagnosis not present

## 2020-01-24 DIAGNOSIS — I10 Essential (primary) hypertension: Secondary | ICD-10-CM

## 2020-01-24 DIAGNOSIS — L03031 Cellulitis of right toe: Secondary | ICD-10-CM

## 2020-01-24 DIAGNOSIS — E782 Mixed hyperlipidemia: Secondary | ICD-10-CM | POA: Diagnosis not present

## 2020-01-24 DIAGNOSIS — E11649 Type 2 diabetes mellitus with hypoglycemia without coma: Secondary | ICD-10-CM

## 2020-01-24 DIAGNOSIS — E0821 Diabetes mellitus due to underlying condition with diabetic nephropathy: Secondary | ICD-10-CM

## 2020-01-24 DIAGNOSIS — I739 Peripheral vascular disease, unspecified: Secondary | ICD-10-CM

## 2020-01-24 DIAGNOSIS — I152 Hypertension secondary to endocrine disorders: Secondary | ICD-10-CM

## 2020-01-24 DIAGNOSIS — E0822 Diabetes mellitus due to underlying condition with diabetic chronic kidney disease: Secondary | ICD-10-CM | POA: Insufficient documentation

## 2020-01-24 DIAGNOSIS — E1169 Type 2 diabetes mellitus with other specified complication: Secondary | ICD-10-CM | POA: Diagnosis not present

## 2020-01-24 LAB — CUP PACEART REMOTE DEVICE CHECK
Battery Remaining Longevity: 105 mo
Battery Voltage: 2.94 V
Brady Statistic AP VP Percent: 0 %
Brady Statistic AP VS Percent: 0 %
Brady Statistic AS VP Percent: 0 %
Brady Statistic AS VS Percent: 0 %
Brady Statistic RA Percent Paced: 0.21 %
Brady Statistic RV Percent Paced: 99.58 %
Date Time Interrogation Session: 20210315003318
Implantable Lead Implant Date: 20190124
Implantable Lead Implant Date: 20190124
Implantable Lead Location: 753859
Implantable Lead Location: 753860
Implantable Lead Model: 5076
Implantable Lead Model: 5076
Implantable Pulse Generator Implant Date: 20190124
Lead Channel Impedance Value: 304 Ohm
Lead Channel Impedance Value: 342 Ohm
Lead Channel Impedance Value: 361 Ohm
Lead Channel Impedance Value: 437 Ohm
Lead Channel Pacing Threshold Amplitude: 0.625 V
Lead Channel Pacing Threshold Amplitude: 0.625 V
Lead Channel Pacing Threshold Pulse Width: 0.4 ms
Lead Channel Pacing Threshold Pulse Width: 0.4 ms
Lead Channel Sensing Intrinsic Amplitude: 1.625 mV
Lead Channel Sensing Intrinsic Amplitude: 1.625 mV
Lead Channel Sensing Intrinsic Amplitude: 10.75 mV
Lead Channel Sensing Intrinsic Amplitude: 10.75 mV
Lead Channel Setting Pacing Amplitude: 2 V
Lead Channel Setting Pacing Amplitude: 2.5 V
Lead Channel Setting Pacing Pulse Width: 0.4 ms
Lead Channel Setting Sensing Sensitivity: 4 mV

## 2020-01-24 LAB — POCT GLUCOSE (DEVICE FOR HOME USE): Glucose Fasting, POC: 52 mg/dL — AB (ref 70–99)

## 2020-01-24 MED ORDER — MUPIROCIN 2 % EX OINT: TOPICAL_OINTMENT | CUTANEOUS | 0 refills | Status: DC

## 2020-01-24 NOTE — Progress Notes (Signed)
Impression and Recommendations:    1. Uncontrolled type 2 diabetes mellitus with hypoglycemia without coma (HCC)   2. Paroxysmal atrial fibrillation ([place])   3. Morbid obesity ([place])   4. Hypertension associated with diabetes ([place])   5. Mixed diabetic hyperlipidemia associated with type 2 diabetes mellitus ([place])   6. Diabetes mellitus due to underlying condition with stage 3b chronic kidney disease, without long-term current use of insulin (HCC)   7. h/o Cellulitis of right 2nd toe - WNL's now and well healed   8. PAD (peripheral artery disease) ([place])     Of note, this is my first time meeting patient.  Patient is new to me and was previously being cared for at our office by Mina Marble, NP, who no longer works at primary care [employer].   - Last lab work obtained 12/03/2019.  - Per patient, is fasting for additional lab work today and, as he took his insulin last night, feeling sweaty per how he feels with low blood sugars.  Patient's fasting blood glucose today was 52.  Eight ounces of ginger ale provided along with graham crackers and peanut butter to eat.  Patient has cracked open ginger ale and is drinking it prior to leaving office today.   Stage 3b CKD - BMP with GFR drawn today to assess renal function. - If serum creatinine is still rising and GFR still dropping, consider sending to nephrology. - Will continue to monitor alongside specialist.   History of Cellulitis of Right 2nd Toe - WNL's now and well healed - Reviewed patient's concerns during appointment today. - Discussed use of ointment as a preventative measure in the future. - Prescription Bactroban ointment provided today.  See med list. - Will continue to monitor.   Paroxysmal Atrial Fibrillation - Sees Dr. Serita Sheller of cardiology for management of afib, lasix and heart rate. - Stable at this time. - Continue treatment plan as established via cardiology. - Will continue to monitor alongside  specialist.   PAD - Followed by vascular specialist. - Continue treatment plan as established. - Patient knows to continue to follow up with vascular specialist.   Uncontrolled Type 2 DM with Hypoglycemia w/out Coma - Managed by Dr. Dwyane Dee of endocrinology. - A1c measured 8.0 one month ago, stable at this time. - Continue treatment plan as established via endocrinology. - Patient knows to continue to follow up with specialist as established. - Will continue to monitor alongside specialist.   Hypertension associated with DM - Managed by cardiology. - Stable at this time. - Continue treatment plan as established via cardiology. - Patient knows to continue to follow up with specialist as established. - Will continue to monitor alongside specialist.   Mixed Diabetic Hyperlipidemia associated with type 2 DM - Managed by cardiology. - Stable at this time. - Continue treatment plan as established via cardiology. - Patient knows to continue to follow up with specialist as established. - Will continue to monitor alongside specialist.  Health counseling performed.  All questions answered.    Orders Placed This Encounter  Procedures  . Basic metabolic panel  . Lipid panel  . TSH  . T4, free  . VITAMIN D 25 Hydroxy (Vit-D Deficiency, Fractures)  . POCT Glucose (Device for Home Use)    Meds ordered this encounter  Medications  . mupirocin ointment (BACTROBAN) 2 %    Sig: If open sores develop, apply TID    Dispense:  30 g    Refill:  0     Please see AVS handed out to patient at the end of our visit for further patient instructions/ counseling done pertaining to today's office visit.   Return in about 4 months (around 05/25/2020) for chronic dx monitoring alongside your various specialists.     Note:  This note was prepared with assistance of Dragon voice recognition software. Occasional wrong-word or sound-a-like substitutions may have occurred due to the inherent  limitations of voice recognition software.   The [place] was signed into law in 2016 which includes the topic of electronic health records.  This provides immediate access to information in MyChart.  This includes consultation notes, operative notes, office notes, lab results and pathology reports.  If you have any questions about what you read please let us know at your next visit or call us at the office.  We are right here with you.  This document serves as a record of services personally performed by Mellody Dance, DO. It was created on her behalf by Toni Amend, a trained medical scribe. The creation of this record is based on the scribe's personal observations and the provider's statements to them.   This case required medical decision making of at least moderate complexity. The above documentation from Toni Amend, medical scribe, has been reviewed by Marjory Sneddon, D.O.  --------------------------------------------------------------------------------------------------------------------------------------------------------------------------------------------------------------------------------------------    Subjective:    Steven Maldonado, am serving as scribe for Dr.Jonel Sick.    HPI: Steven Maldonado is a 79 y.o. male who presents to [place] [facility] today for issues as discussed below.   - Old sores healed up on top of right second toe Notes he continues worrying about this area, although it is currently well-healed.  He does not go to a wound care specialist.  Notes in the past the area of concern was "liquefied."  He used to see a PA named Olean Ree in [employer], who handled these concerns historically.   - General Health Overall, says he's been feeling good.  Denies new swelling or swelling that's worse than usual in his RLE.  Sleeps on one pillow at night and uses a CPAP.  Denies concerns with breathing.   Denies increased desire to sit up or prop himself up at night.  Denies increased SOB when he lies down.  Denies chest pain or tightness.  He's been getting back out to walk again and has the goal to increase his distance walking.    HPI:   Diabetes Mellitus:  Confirms that Dr. Dwyane Dee manages/ monitors his diabetes.  Patient notes he took his insulin last night, but to prepare for lab work this morning, he has fasted.  Notes he is sweaty today during appointment, which to him signifies that he's low in sugar.  Home glucose readings:  85 this morning, and 75 yesterday.  Says "I know what it's like when it gets below 65; I start sweating."  Says he takes sugar pills when his sugars get low, and he knows what to do.   - Patient reports good compliance with therapy plan: medication and/or lifestyle modification.  - His denies acute concerns or problems related to treatment plan.  - He denies new concerns.  Denies polyuria/polydipsia, hypo/ hyperglycemia symptoms.  Denies new onset of: chest pain, exercise intolerance, shortness of breath, dizziness, visual changes, headache, lower extremity swelling or claudication.   Last A1C in the office was:  Lab Results  Component Value Date  HGBA1C 8.0 (H) 12/03/2019   HGBA1C 7.6 (H) 06/04/2019   HGBA1C 5.9 (A) 11/23/2018   Lab Results  Component Value Date   MICROALBUR 3.0 (H) 12/03/2019   LDLCALC 142 (H) 05/31/2019   CREATININE 1.74 (H) 12/03/2019   BP Readings from Last 3 Encounters:  01/24/20 121/77  12/07/19 (!) 110/50  10/06/19 (!) 149/65   Wt Readings from Last 3 Encounters:  01/24/20 255 lb 9.6 oz (115.9 kg)  12/07/19 246 lb (111.6 kg)  10/06/19 241 lb 3.2 oz (109.4 kg)     HPI:  Hypertension associated with DM:  Pt Confirms he is monitored by Dr. Serita Sheller of Cardiology.  He does not check his blood pressures at home.  - Patient reports good compliance with medication and/or lifestyle modification  - His denies acute  concerns or problems related to treatment plan  - He denies new onset of: chest pain, exercise intolerance, shortness of breath, dizziness, visual changes, headache, lower extremity swelling or claudication.   Last 3 blood pressure readings in our office are as follows: BP Readings from Last 3 Encounters:  01/24/20 121/77  12/07/19 (!) 110/50  10/06/19 (!) 149/65   Filed Weights   01/24/20 0941  Weight: 255 lb 9.6 oz (115.9 kg)     HPI:  Hyperlipidemia associated with DM:  79 y.o. male here for cholesterol follow-up.   - Patient reports good compliance with treatment plan of:  medication and/ or lifestyle management.    - Patient denies any acute concerns or problems with management plan   - He denies new onset of: myalgias, arthralgias, increased fatigue more than normal, chest pains, exercise intolerance, shortness of breath, dizziness, visual changes, headache, lower extremity swelling or claudication.   Most recent cholesterol panel was:  Lab Results  Component Value Date   CHOL 219 (H) 05/31/2019   HDL 36 (L) 05/31/2019   LDLCALC 142 (H) 05/31/2019   LDLDIRECT 123.0 12/03/2019   TRIG 264 (H) 05/31/2019   CHOLHDL 6.1 (H) 05/31/2019   Hepatic Function Latest Ref Rng & Units 12/03/2019 05/31/2019 01/25/2019  Total Protein 6.0 - 8.3 g/dL 7.2 6.5 6.9  Albumin 3.5 - 5.2 g/dL 4.3 - 4.1  AST 0 - 37 U/L '21 23 25  ' ALT 0 - 53 U/L '16 27 31  ' Alk Phosphatase 39 - 117 U/L 31(L) - 28(L)  Total Bilirubin 0.2 - 1.2 mg/dL 0.5 0.4 0.4    Wt Readings from Last 3 Encounters:  01/24/20 255 lb 9.6 oz (115.9 kg)  12/07/19 246 lb (111.6 kg)  10/06/19 241 lb 3.2 oz (109.4 kg)   BP Readings from Last 3 Encounters:  01/24/20 121/77  12/07/19 (!) 110/50  10/06/19 (!) 149/65   Pulse Readings from Last 3 Encounters:  01/24/20 63  12/07/19 68  10/06/19 65   BMI Readings from Last 3 Encounters:  01/24/20 35.65 kg/m  12/07/19 34.31 kg/m  10/06/19 33.64 kg/m     Patient Care Team      Relationship Specialty Notifications Start End  Mina Marble D, NP PCP - General Family Medicine  10/06/19   Newt Minion, MD Consulting Physician Orthopedic Surgery  04/09/18   Pleasant, Eppie Gibson, [place] Management  Admissions 09/03/18   Charlesetta Garibaldi, [place]  Cardiology  10/06/19   Wylene Simmer, MD Consulting Physician Orthopedic Surgery  10/06/19   Elayne Snare, MD Consulting Physician Endocrinology  10/06/19   Dasher, Rayvon Char, MD  Dermatology  10/06/19   Pll[facility], [facility]  [place]    10/06/19      Patient Active Problem List   Diagnosis Date Noted  . Type II diabetes mellitus, uncontrolled ([place]) 04/22/2018  . Paroxysmal atrial fibrillation ([place]) 12/05/2017  . Mixed diabetic hyperlipidemia associated with type 2 diabetes mellitus ([place]) 01/10/2011  . PVD (peripheral vascular disease) ([place]) 05/24/2014  . Morbid obesity ([place]) 01/24/2020  . Diabetes mellitus due to underlying condition with stage 3b chronic kidney disease, without long-term current use of insulin ([place]) 01/24/2020  . PAD (peripheral artery disease) ([place]) 01/24/2020  . Healthcare maintenance 10/06/2019  . Controlled type 2 diabetes mellitus without complication ([place]) 56/97/9480  . Bradycardia 09/01/2018  . Gangrene of left foot ([place]) 05/28/2018  . 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 Obstructive sleep apnea syndrome 05/04/2018  . Obstructive sleep apnea treated with continuous positive airway pressure (CPAP) 05/04/2018  . Atherosclerosis of native arteries of the extremities with gangrene ([place]) 04/09/2018  . Atherosclerosis of native artery of extremity ([place]) 04/09/2018  . Cellulitis of right toe 04/04/2018  . Cardiac pacemaker in situ   . Symptomatic bradycardia   . Asystole ([place])   . 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 History of aortic valve replacement 12/02/2017  . Severe aortic stenosis   .  Bilateral impacted cerumen 01/22/2016  . Hypertension associated with diabetes ([place]) 11/14/2015  . Chronic kidney disease 08/30/2013  . Hypothyroidism 08/30/2013  . 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 Diabetes mellitus type 2, uncomplicated ([place])   . Aortic valve stenosis 07/25/2011  . CAD (coronary artery disease) 01/10/2011  . Coronary arteriosclerosis 01/10/2011    Past Medical history, Surgical history, Family history, Social history, Allergies and Medications have been entered into the medical record, reviewed and changed as needed.    Current Meds  Medication Sig  . amiodarone (PACERONE) 200 MG tablet TAKE 1 TABLET BY MOUTH EVERY DAY  . atorvastatin (LIPITOR) 80 MG tablet Take 1 tablet (80 mg total) by mouth at bedtime.  . Choline Fenofibrate (FENOFIBRIC ACID) 135 MG CPDR TAKE 1 CAPSULE DAILY  . dabigatran (PRADAXA) 150 MG CAPS capsule Take 1 capsule (150 mg total) by mouth every 12 (twelve) hours.  . docusate sodium (COLACE) 100 MG capsule Take 100 mg by mouth 2 (two) times daily.  [name] Kitchen ezetimibe (ZETIA) 10 MG tablet Take 1 tablet (10 mg total) by mouth daily.  . furosemide (LASIX) 20 MG tablet Take 1 tablet (20 mg total) by mouth daily.  . Insulin Degludec (TRESIBA FLEXTOUCH) 200 UNIT/ML SOPN Inject 26 Units into the skin at bedtime.  . insulin lispro (HUMALOG KWIKPEN) 100 UNIT/ML KwikPen Inject 4-6 units under the skin up to three times daily if eating sweets or high carb foods.  [name] Kitchen levothyroxine (SYNTHROID) 175 MCG tablet TAKE 1 TABLET BY MOUTH EVERY DAY  . metoprolol tartrate (LOPRESSOR) 50 MG tablet TAKE ONE AND ONE-HALF TABLET BY MOUTH TWICE A DAY  . mirabegron ER (MYRBETRIQ) 25 MG TB24 tablet Take 1 tablet (25 mg total) by mouth daily.    Allergies:  No Known Allergies   Review of Systems:  A fourteen system review of systems was performed and found to be positive as per HPI.   Objective:   Blood pressure 121/77, pulse 63, temperature 97.6 F  (36.4 C), temperature source Oral, resp. rate 16, height '5\' 11"'  (1.803 m), weight 255 lb 9.6 oz (115.9 kg), SpO2 99 %. Body  mass index is 35.65 kg/m. General:  Well Developed, well nourished, appropriate for stated age.  Neuro:  Alert and oriented,  extra-ocular muscles intact  HEENT:  Normocephalic, atraumatic, neck supple, no carotid bruits appreciated  Skin:  no gross rash, warm, pink. Cardiac:  sounded regular today, RRR, S1 S2 Respiratory: ECTA B/L and A/P, Not using accessory muscles, speaking in full sentences- unlabored. Vascular:  Ext warm, no cyanosis apprec.; cap RF less 2 sec. Psych:  No HI/SI, judgement and insight good, Euthymic mood. Full Affect.

## 2020-01-24 NOTE — Patient Instructions (Signed)
Preventing Diabetes Mellitus Complications  You can take action to prevent or slow down problems that are caused by diabetes (diabetes mellitus). Following your diabetes plan and taking care of yourself can reduce your risk of serious or life-threatening complications. What actions can I take to prevent diabetes complications? Manage your diabetes   Follow instructions from your health care providers about managing your diabetes. Your diabetes may be managed by a team of health care providers who can teach you how to care for yourself and can answer questions that you have.  Educate yourself about your condition so you can make healthy choices about eating and physical activity.  Check your blood sugar (glucose) levels as often as directed. Your health care provider will help you decide how often to check your blood glucose level depending on your treatment goals and how well you are meeting them.  Ask your health care provider if you should take low-dose aspirin daily and what dose is recommended for you. Taking low-dose aspirin daily is recommended to help prevent cardiovascular disease. Do not use nicotine or tobacco Do not use any products that contain nicotine or tobacco, such as cigarettes and e-cigarettes. If you need help quitting, ask your health care provider. Nicotine raises your risk for diabetes problems. If you quit using nicotine:  You will lower your risk for heart attack, stroke, nerve disease, and kidney disease.  Your cholesterol and blood pressure may improve.  Your blood circulation will improve. Keep your blood pressure under control Your personal target blood pressure is determined based on:  Your age.  Your medicines.  How long you have had diabetes.  Any other medical conditions you have. To control your blood pressure:  Follow instructions from your health care provider about meal planning, exercise, and medicines.  Make sure your health care provider  checks your blood pressure at every medical visit.  Monitor your blood pressure at home as told by your health care provider.  Keep your cholesterol under control To control your cholesterol:  Follow instructions from your health care provider about meal planning, exercise, and medicines.  Have your cholesterol checked at least once a year.  You may be prescribed medicine to lower cholesterol (statin). If you are not taking a statin, ask your health care provider if you should be. Controlling your cholesterol may:  Help prevent heart disease and stroke. These are the most common health problems for people with diabetes.  Improve your blood flow. Schedule and keep yearly physical exams and eye exams Your health care provider will tell you how often you need medical visits depending on your diabetes management plan. Keep all follow-up visits as directed. This is important so possible problems can be identified early and complications can be avoided or treated.  Every visit with your health care provider should include measuring your: ? Weight. ? Blood pressure. ? Blood glucose control.  Your A1c (hemoglobin A1c) level should be checked: ? At least 2 times a year, if you are meeting your treatment goals. ? 4 times a year, if you are not meeting treatment goals or if your treatment goals have changed.  Your blood lipids (lipid profile) should be checked yearly. You should also be checked yearly for protein in your urine (urine microalbumin).  If you have type 1 diabetes, get an eye exam 3-5 years after you are diagnosed, and then once a year after your first exam.  If you have type 2 diabetes, get an eye exam as soon as  you are diagnosed, and then once a year after your first exam. Keep your vaccines current It is recommended that you receive:  A flu (influenza) vaccine every year.  A pneumonia (pneumococcal) vaccine and a hepatitis B vaccine. If you are age 64 or older, you may  get the pneumonia vaccine as a series of two separate shots. Ask your health care provider which other vaccines may be recommended. Take care of your feet Diabetes may cause you to have poor blood circulation to your legs and feet. Because of this, taking care of your feet is very important. Diabetes can cause:  The skin on the feet to get thinner, break more easily, and heal more slowly.  Nerve damage in your legs and feet, which results in decreased feeling. You may not notice minor injuries that could lead to serious problems. To avoid foot problems:  Check your skin and feet every day for cuts, bruises, redness, blisters, or sores.  Schedule a foot exam with your health care provider once every year. This exam includes: ? Inspecting of the structure and skin of your feet. ? Checking the pulses and sensation in your feet.  Make sure that your health care provider performs a visual foot exam at every medical visit.  Take care of your teeth People with poorly controlled diabetes are more likely to have gum (periodontal) disease. Diabetes can make periodontal diseases harder to control. If not treated, periodontal diseases can lead to tooth loss. To prevent this:  Brush your teeth twice a day.  Floss at least once a day.  Visit your dentist 2 times a year. Drink responsibly Limit alcohol intake to no more than 1 drink a day for nonpregnant women and 2 drinks a day for men. One drink equals 12 oz of beer, 5 oz of wine, or 1 oz of hard liquor.  It is important to eat food when you drink alcohol to avoid low blood glucose (hypoglycemia). Avoid alcohol if you:  Have a history of alcohol abuse or dependence.  Are pregnant.  Have liver disease, pancreatitis, advanced neuropathy, or severe hypertriglyceridemia. Lessen stress Living with diabetes can be stressful. When you are experiencing stress, your blood glucose may be affected in two ways:  Stress hormones may cause your blood  glucose to rise.  You may be distracted from taking good care of yourself. Be aware of your stress level and make changes to help you manage challenging situations. To lower your stress levels:  Consider joining a support group.  Do planned relaxation or meditation.  Do a hobby that you enjoy.  Maintain healthy relationships.  Exercise regularly.  Work with your health care provider or a mental health professional. Summary  You can take action to prevent or slow down problems that are caused by diabetes (diabetes mellitus). Following your diabetes plan and taking care of yourself can reduce your risk of serious or life-threatening complications.  Follow instructions from your health care providers about managing your diabetes. Your diabetes may be managed by a team of health care providers who can teach you how to care for yourself and can answer questions that you have.  Your health care provider will tell you how often you need medical visits depending on your diabetes management plan. Keep all follow-up visits as directed. This is important so possible problems can be identified early and complications can be avoided or treated. This information is not intended to replace advice given to you by your health care provider. Make  sure you discuss any questions you have with your health care provider. Document Revised: 01/26/2018 Document Reviewed: 07/27/2016 Elsevier Patient Education  [place].      Diabetes Mellitus and [place] care is an important part of your health, especially when you have diabetes. Diabetes may cause you to have problems because of poor blood flow (circulation) to your feet and legs, which can cause your skin to:  Become thinner and drier.  Break more easily.  Heal more slowly.  Peel and crack. You may also have nerve damage (neuropathy) in your legs and feet, causing decreased feeling in them. This means that you may not notice minor  injuries to your feet that could lead to more serious problems. Noticing and addressing any potential problems early is the best way to prevent future foot problems. How to care for your feet Foot hygiene  Wash your feet daily with warm water and mild soap. Do not use hot water. Then, pat your feet and the areas between your toes until they are completely dry. Do not soak your feet as this can dry your skin.  Trim your toenails straight across. Do not dig under them or around the cuticle. File the edges of your nails with an emery board or nail file.  Apply a moisturizing lotion or petroleum jelly to the skin on your feet and to dry, brittle toenails. Use lotion that does not contain alcohol and is unscented. Do not apply lotion between your toes. Shoes and socks  Wear clean socks or stockings every day. Make sure they are not too tight. Do not wear knee-high stockings since they may decrease blood flow to your legs.  Wear shoes that fit properly and have enough cushioning. Always look in your shoes before you put them on to be sure there are no objects inside.  To break in new shoes, wear them for just a few hours a day. This prevents injuries on your feet. Wounds, scrapes, corns, and calluses  Check your feet daily for blisters, cuts, bruises, sores, and redness. If you cannot see the bottom of your feet, use a mirror or ask someone for help.  Do not cut corns or calluses or try to remove them with medicine.  If you find a minor scrape, cut, or break in the skin on your feet, keep it and the skin around it clean and dry. You may clean these areas with mild soap and water. Do not clean the area with peroxide, alcohol, or iodine.  If you have a wound, scrape, corn, or callus on your foot, look at it several times a day to make sure it is healing and not infected. Check for: ? Redness, swelling, or pain. ? Fluid or blood. ? Warmth. ? Pus or a bad smell. General instructions  Do not  cross your legs. This may decrease blood flow to your feet.  Do not use heating pads or hot water bottles on your feet. They may burn your skin. If you have lost feeling in your feet or legs, you may not know this is happening until it is too late.  Protect your feet from hot and cold by wearing shoes, such as at the beach or on hot pavement.  Schedule a complete foot exam at least once a year (annually) or more often if you have foot problems. If you have foot problems, report any cuts, sores, or bruises to your health care provider immediately. Contact a health care provider if:  You have a medical condition that increases your risk of infection and you have any cuts, sores, or bruises on your feet.  You have an injury that is not healing.  You have redness on your legs or feet.  You feel burning or tingling in your legs or feet.  You have pain or cramps in your legs and feet.  Your legs or feet are numb.  Your feet always feel cold.  You have pain around a toenail. Get help right away if:  You have a wound, scrape, corn, or callus on your foot and: ? You have pain, swelling, or redness that gets worse. ? You have fluid or blood coming from the wound, scrape, corn, or callus. ? Your wound, scrape, corn, or callus feels warm to the touch. ? You have pus or a bad smell coming from the wound, scrape, corn, or callus. ? You have a fever. ? You have a red line going up your leg. Summary  Check your feet every day for cuts, sores, red spots, swelling, and blisters.  Moisturize feet and legs daily.  Wear shoes that fit properly and have enough cushioning.  If you have foot problems, report any cuts, sores, or bruises to your health care provider immediately.  Schedule a complete foot exam at least once a year (annually) or more often if you have foot problems. This information is not intended to replace advice given to you by your health care provider. Make sure you discuss any  questions you have with your health care provider. Document Revised: 07/21/2019 Document Reviewed: 11/29/2016 Elsevier Patient Education  [place].

## 2020-01-24 NOTE — Progress Notes (Signed)
PPM Remote  

## 2020-01-24 NOTE — Telephone Encounter (Signed)
-----   Message from Will Meredith Leeds, MD sent at 01/24/2020 11:36 AM EDT ----- Abnormal device interrogation reviewed.  Lead parameters and battery status stable.  In AF. Will need clinic follow up to discuss.

## 2020-01-24 NOTE — Telephone Encounter (Signed)
Spoke with patient and wife regarding AF episode ongoing since 01/18/20. Pt denies any awareness of AF or symptoms. Reports compliance with amiodarone and Pradaxa. Pt agreeable to appointment with Tommye Standard, [name], on 02/02/20 at 1:00pm (unable to accept open EP APP slot on 3/17 due to another appointment). Pt aware to call back if he begins to notice symptoms in the interim. No additional questions at this time.

## 2020-01-24 NOTE — Addendum Note (Signed)
Addended by: Jennette Banker on: 01/24/2020 02:15 PM   Modules accepted: Level of Service

## 2020-01-25 ENCOUNTER — Encounter: Payer: Self-pay | Admitting: Family Medicine

## 2020-01-25 DIAGNOSIS — E1122 Type 2 diabetes mellitus with diabetic chronic kidney disease: Secondary | ICD-10-CM | POA: Insufficient documentation

## 2020-01-25 LAB — VITAMIN D 25 HYDROXY (VIT D DEFICIENCY, FRACTURES): Vit D, 25-Hydroxy: 18.8 ng/mL — ABNORMAL LOW (ref 30.0–100.0)

## 2020-01-25 LAB — LIPID PANEL
Chol/HDL Ratio: 4.7 ratio (ref 0.0–5.0)
Cholesterol, Total: 161 mg/dL (ref 100–199)
HDL: 34 mg/dL — ABNORMAL LOW (ref >39)
LDL Chol Calc (NIH): 106 mg/dL — ABNORMAL HIGH (ref 0–99)
Triglycerides: 113 mg/dL (ref 0–149)
VLDL Cholesterol Cal: 21 mg/dL (ref 5–40)

## 2020-01-25 LAB — BASIC METABOLIC PANEL
BUN/Creatinine Ratio: 15 (ref 10–24)
BUN: 30 mg/dL — ABNORMAL HIGH (ref 8–27)
CO2: 19 mmol/L — ABNORMAL LOW (ref 20–29)
Calcium: 10 mg/dL (ref 8.6–10.2)
Chloride: 108 mmol/L — ABNORMAL HIGH (ref 96–106)
Creatinine, Ser: 1.96 mg/dL — ABNORMAL HIGH (ref 0.76–1.27)
GFR calc Af Amer: 37 mL/min/{1.73_m2} — ABNORMAL LOW (ref >59)
GFR calc non Af Amer: 32 mL/min/{1.73_m2} — ABNORMAL LOW (ref >59)
Glucose: 53 mg/dL — ABNORMAL LOW (ref 65–99)
Potassium: 4.5 mmol/L (ref 3.5–5.2)
Sodium: 147 mmol/L — ABNORMAL HIGH (ref 134–144)

## 2020-01-25 LAB — T4, FREE: Free T4: 1.31 ng/dL (ref 0.82–1.77)

## 2020-01-25 LAB — TSH: TSH: 8.39 u[IU]/mL — ABNORMAL HIGH (ref 0.450–4.500)

## 2020-01-26 ENCOUNTER — Other Ambulatory Visit: Payer: Self-pay | Admitting: Cardiology

## 2020-01-26 ENCOUNTER — Other Ambulatory Visit: Payer: Self-pay | Admitting: Family Medicine

## 2020-01-26 ENCOUNTER — Other Ambulatory Visit: Payer: Self-pay | Admitting: Cardiovascular Disease

## 2020-01-26 MED ORDER — VITAMIN D (ERGOCALCIFEROL) 1.25 MG (50000 UNIT) PO CAPS: 50000.0000 [IU] | ORAL_CAPSULE | ORAL | 1 refills | Status: DC

## 2020-01-27 ENCOUNTER — Telehealth: Payer: Self-pay | Admitting: Endocrinology

## 2020-01-27 NOTE — Telephone Encounter (Signed)
Lab results received from PCP indicate his blood sugar was low.  If he is starting to get low blood sugars and having difficulty with control will need to move up his appointment to as soon as possible instead of late April, please call him to assess

## 2020-01-27 NOTE — Telephone Encounter (Signed)
LM for Patient to call back to see if his appointment needs to be moved up.

## 2020-01-27 NOTE — Telephone Encounter (Signed)
Patient's wife states that he was only given something because he was fasting for his appointment and is not have any issues since.

## 2020-02-01 NOTE — Progress Notes (Addendum)
Cardiology Office Note Date:  02/02/2020  Patient ID:  [place] 14-Apr-1941, MRN [id number] PCP:  Steven Grandchild, NP  Cardiologist:  Dr. Angelena Form Electrophysiologist; Dr. Curt Bears    Chief Complaint:  Ongoing AFib episode  History of Present Illness: [place] is a 79 y.o. male with history of CAD (2018, R/LHC which showed moderate 60% proximal stenosis of the left anterior descending coronary artery that was felt not to be flow limiting. There was 100% chronic occlusion of the right coronary artery. There was otherwise nonobstructive coronary artery disease), AFib, HTN, HLD, IDDM, CKD (III), chronic CHF (diastolic), OSA on CPAP, VHD severe AS s/p TAVR, CHB post TAVR w/PPM, PVD s/p left BKA.  He comes in today to be seen for Dr. Curt Bears, last seen by him Oct 2019.  He was doing well, at that time s/p BKA, maintaining SR on amiodarone.  Device remote noted an ongoing Af episode since 01/18/20, was asymptomatic and recommended he come in for evaluation.  He tells me he knew he was out of rhythm, the Afib makes him tired, easily wionded.  He denies any kind iof CP, no palpitations or cardiac awareness other then the fatigue he knw was the AFib.  No dizzy spells, no near syncope or syncope. No bleeding or signs of bleeding with his Pradaxa He reports compliance with his Pradaxa, he is certain he has not missed any doses in the last 3 weeks and longer.   He wears CPAP at night denies symptoms of PND or orthopnea.  He saw a kidney specialist at [facility] he thinks about a week or so ago now and his 3d/week lasix was increased to daily.  He follows with PMD and endocrinology as well.    Device information MDT dual chamber PPM, implanted 01/04/2018   Afib hx Diagnosed 2012 Historically seen by Dr. Rayann Heman, recommended consideration for traditional AVR with MAXE, though the patient did not want to consider this strategy for his AS  AAD Amiodarone started 2018 >> is  current  Past Medical History:  Diagnosis Date  . Chronic diastolic CHF (congestive heart failure) ([place])   . CKD (chronic kidney disease), stage III   . Constipation   . Coronary artery disease    a. Cath February 2012 in [facility], occluded RCA with collaterals  . DM type 2 (diabetes mellitus, type 2) ([place])   . Essential hypertension   . Hyperlipidemia   . Neuropathy    feet  . Pacemaker    a. symptomatic brady after TAVR s/p MDT PPM by Dr. Curt Bears 12/04/17  . Persistent atrial fibrillation ([address])   . PONV (postoperative nausea and vomiting)    after valve surgery  . PVD (peripheral vascular disease) ([place])    a. s/p R popliteal artery stenosis tx with drug-coated balloon 05/2014, followed by Dr. Fletcher Anon.  . 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 Severe aortic stenosis    a. 12/02/17: s/p TAVR  . Skin cancer   . Sleep apnea with use of continuous positive airway pressure (CPAP)    04-11-11 AHI was 32.9 and titrated to 15 cm H20, DME is [facility]  . Subclinical hypothyroidism     Past Surgical History:  Procedure Laterality Date  . ABDOMINAL ANGIOGRAM N/A 06/08/2014   Procedure: ABDOMINAL ANGIOGRAM;  Surgeon: Wellington Hampshire, MD;  Location: [facility] CATH LAB;  Service:  Cardiovascular;  Laterality: N/A;  . ABDOMINAL AORTOGRAM N/A 04/09/2018   Procedure: ABDOMINAL AORTOGRAM;  Surgeon: Conrad Despard, MD;  Location: [place] CV LAB;  Service: Cardiovascular;  Laterality: N/A;  . AMPUTATION Left 04/17/2018   Procedure: LEFT FOOT 3RD RAY AMPUTATION;  Surgeon: Newt Minion, MD;  Location: [place];  Service: Orthopedics;  Laterality: Left;  . AMPUTATION Left 05/28/2018   Procedure: LEFT AMPUTATION BELOW KNEE;  Surgeon: Wylene Simmer, MD;  Location: [place];  Service: Orthopedics;  Laterality: Left;  . APPENDECTOMY  1965  . 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 CARDIAC  CATHETERIZATION  12/2010  . CARDIOVERSION  07/2011  . CARDIOVERSION N/A 04/18/2014   Procedure: CARDIOVERSION;  Surgeon: Dorothy Spark, MD;  Location: [place];  Service: Cardiovascular;  Laterality: N/A;  . CARDIOVERSION N/A 11/03/2015   Procedure: CARDIOVERSION;  Surgeon: Lelon Perla, MD;  Location: [facility] ENDOSCOPY;  Service: Cardiovascular;  Laterality: N/A;  . CARDIOVERSION N/A 05/08/2017   Procedure: CARDIOVERSION;  Surgeon: Dorothy Spark, MD;  Location: [facility] ENDOSCOPY;  Service: Cardiovascular;  Laterality: N/A;  . CARDIOVERSION N/A 07/28/2017   Procedure: CARDIOVERSION;  Surgeon: Dorothy Spark, MD;  Location: [place];  Service: Cardiovascular;  Laterality: N/A;  . LOWER EXTREMITY ANGIOGRAM N/A 06/08/2014   Procedure: LOWER EXTREMITY ANGIOGRAM;  Surgeon: Wellington Hampshire, MD;  Location: [facility] CATH LAB;  Service: Cardiovascular;  Laterality: N/A;  . LOWER EXTREMITY ANGIOGRAPHY Left 04/09/2018   Procedure: Lower Extremity Angiography;  Surgeon: Conrad [place], MD;  Location: [place] CV LAB;  Service: Cardiovascular;  Laterality: Left;  . PACEMAKER IMPLANT N/A 12/04/2017   Procedure: PACEMAKER IMPLANT;  Surgeon: Constance Haw, MD;  Location: [place] CV LAB;  Service: Cardiovascular;  Laterality: N/A;  . PERIPHERAL VASCULAR BALLOON ANGIOPLASTY Left 04/09/2018   Procedure: PERIPHERAL VASCULAR BALLOON ANGIOPLASTY;  Surgeon: Conrad Riley, MD;  Location: [place] CV LAB;  Service: Cardiovascular;  Laterality: Left;  SFA  . POPLITEAL ARTERY ANGIOPLASTY Right 06/08/2014   Archie Endo 06/08/2014  . RIGHT/LEFT HEART CATH AND CORONARY ANGIOGRAPHY N/A 10/08/2017   Procedure: RIGHT/LEFT HEART CATH AND CORONARY ANGIOGRAPHY;  Surgeon: Burnell Blanks, MD;  Location: [place] CV LAB;  Service: Cardiovascular;  Laterality: N/A;  . SKIN CANCER EXCISION Bilateral    "have had them cut off back of neck X 2; off left upper arm; right wrist, near right shoulder blade" (06/08/2014)   . TEE WITHOUT CARDIOVERSION N/A 12/02/2017   Procedure: TRANSESOPHAGEAL ECHOCARDIOGRAM (TEE);  Surgeon: Burnell Blanks, MD;  Location: [place];  Service: Open Heart Surgery;  Laterality: N/A;  . TEMPORARY PACEMAKER N/A 12/04/2017   Procedure: TEMPORARY PACEMAKER;  Surgeon: Leonie Man, MD;  Location: [place] CV LAB;  Service: Cardiovascular;  Laterality: N/A;  . TRANSCATHETER AORTIC VALVE REPLACEMENT, TRANSFEMORAL N/A 12/02/2017   Procedure: TRANSCATHETER AORTIC VALVE REPLACEMENT, TRANSFEMORAL;  Surgeon: Burnell Blanks, MD;  Location: [place];  Service: Open Heart Surgery;  Laterality: N/A;  using Edwards Sapien 3 Transcatheter Heart Valve size 24mm    Current Outpatient Medications  Medication Sig Dispense Refill  . amiodarone (PACERONE) 200 MG tablet Take 1 tablet (200 mg total) by mouth daily. Please make overdue appt with Dr. Angelena Form before anymore refills. 1st attempt 30 tablet 0  . atorvastatin (LIPITOR) 80 MG tablet Take 1 tablet (80 mg total) by mouth at bedtime. 90 tablet 3  . BD PEN NEEDLE NANO 2ND GEN 32G X 4 MM MISC USE 4 (  FOUR) TIMES DAILY. 200 each 2  . Blood Glucose Monitoring Suppl (FREESTYLE LITE) DEVI 1 each by Does not apply route 2 (two) times daily. 1 each 0  . Choline Fenofibrate (FENOFIBRIC ACID) 135 MG CPDR TAKE 1 CAPSULE DAILY 90 capsule 3  . dabigatran (PRADAXA) 150 MG CAPS capsule Take 1 capsule (150 mg total) by mouth every 12 (twelve) hours. 180 capsule 3  . docusate sodium (COLACE) 100 MG capsule Take 100 mg by mouth 2 (two) times daily.    . Evolocumab 140 MG/ML SOSY Inject 140 mg into the skin every 14 (fourteen) days. 2.1 mL 2  . ezetimibe (ZETIA) 10 MG tablet Take 1 tablet (10 mg total) by mouth daily. 30 tablet 3  . FREESTYLE LITE test strip 1 EACH BY OTHER ROUTE 4 (FOUR) TIMES DAILY - BEFORE MEALS AND AT BEDTIME. 450 strip 3  . furosemide (LASIX) 20 MG tablet Take 1 tablet (20 mg total) by mouth daily. 90 tablet 3  . glipiZIDE (GLUCOTROL  XL) 10 MG 24 hr tablet TAKE 1 TABLET (10 MG TOTAL) BY MOUTH DAILY WITH BREAKFAST. 90 tablet 1  . Insulin Degludec (TRESIBA FLEXTOUCH) 200 UNIT/ML SOPN Inject 26 Units into the skin at bedtime. 15 mL 1  . insulin lispro (HUMALOG KWIKPEN) 100 UNIT/ML KwikPen Inject 4-6 units under the skin up to three times daily if eating sweets or high carb foods. 30 mL 1  . levothyroxine (SYNTHROID) 175 MCG tablet TAKE 1 TABLET BY MOUTH EVERY DAY 90 tablet 1  . metoprolol tartrate (LOPRESSOR) 50 MG tablet TAKE ONE AND ONE-HALF TABLET BY MOUTH TWICE A DAY 270 tablet 0  . mirabegron ER (MYRBETRIQ) 25 MG TB24 tablet Take 1 tablet (25 mg total) by mouth daily. 90 tablet 3  . mupirocin ointment (BACTROBAN) 2 % If open sores develop, apply TID 30 g 0  . pentoxifylline (TRENTAL) 400 MG CR tablet Take 1 tablet (400 mg total) by mouth 3 (three) times daily with meals. 90 tablet 3  . silver sulfADIAZINE (SILVADENE) 1 % cream Apply 1 application topically daily. 50 g 0  . Vitamin D, Ergocalciferol, (DRISDOL) 1.25 MG (50000 UNIT) CAPS capsule Take 1 capsule (50,000 Units total) by mouth every 7 (seven) days. 12 capsule 1   No current facility-administered medications for this visit.    Allergies:   Patient has no known allergies.   Social History:  The patient  reports that he quit smoking about 47 years ago. His smoking use included cigarettes. He has a 28.00 pack-year smoking history. He has never used smokeless tobacco. He reports that he does not drink alcohol or use drugs.   Family History:  The patient's family history includes Diabetes in his brother, daughter, father, mother, sister, and another family member; Heart Problems in his daughter; Heart attack in his father and mother; Heart failure in his father; Hypertension in his brother, father, mother, and sister; Stroke in his brother.  ROS:  Please see the history of present illness. All other systems are reviewed and otherwise negative.   PHYSICAL EXAM:  VS:   BP (!) 128/52   Pulse 72   Ht 5\' 11"  (1.803 m)   Wt 261 lb (118.4 kg)   BMI 36.40 kg/m  BMI: Body mass index is 36.4 kg/m. Well nourished, well developed, in no acute distress  HEENT: normocephalic, atraumatic  Neck: no JVD, carotid bruits or masses Cardiac:  RRR; (paced) no significant murmurs, no rubs, or gallops Lungs:  CTA b/l, no wheezing,  rhonchi or rales  Abd: soft, nontender, obese MS: LLE BKA w/prosthetic, Ext: 1++ edema RLE to mid shin Skin: warm and dry, no rash Neuro:  No gross deficits appreciated Psych: euthymic mood, full affect  PPM site is stable, no tethering or discomfort   EKG:  Done today and reviewed by myself Afib, V paced  PPM interrogation done today and reviewed by myself is  Battery and lead measurements are good He is in an ongoing Afib episode started 01/18/20   12/02/2018: TTE Study Conclusions  - Left ventricle: The cavity size was normal. There was moderate  concentric hypertrophy. Systolic function was normal. The  estimated ejection fraction was in the range of 60% to 65%. Wall  motion was normal; there were no regional wall motion  abnormalities. Doppler parameters are consistent with abnormal  left ventricular relaxation (grade 1 diastolic dysfunction).  Doppler parameters are consistent with elevated ventricular  end-diastolic filling pressure.  - Aortic valve: S/P TAVR with a 29 mm Edwards-SAPIEN 3 valve. Mean  gradient (S): 12 mm Hg. Peak gradient (S): 25 mm Hg. Valve area  (VTI): 1.6 cm^2. Valve area (Vmax): 1.26 cm^2. Valve area  (Vmean): 1.42 cm^2.  - Mitral valve: There was mild regurgitation.  - Left atrium: The atrium was mildly dilated.  - Right ventricle: Systolic function was normal.  - Tricuspid valve: There was no regurgitation.     10/08/2017: R/LHC  Prox RCA lesion is 100% stenosed.  Ost LAD to Prox LAD lesion is 60% stenosed.  There is severe aortic valve stenosis.  Hemodynamic findings  consistent with mild pulmonary hypertension.   1. Chronic total occlusion of the RCA. The mid and distal RCA fills from left to right collaterals.  2. Moderate stenosis proximal LAD. This does not appear to be flow limiting.  3. Severe aortic valve stenosis (mean gradient 41.8 mmHg, peak to peak gradient 44 mmHg, AVA 0.79 cm2)      Recent Labs: 05/31/2019: Hemoglobin 12.9; Platelets 146 12/03/2019: ALT 16 01/24/2020: BUN 30; Creatinine, Ser 1.96; Potassium 4.5; Sodium 147; TSH 8.390  12/03/2019: Direct LDL 123.0 01/24/2020: Chol/HDL Ratio 4.7; Cholesterol, Total 161; HDL 34; LDL Chol Calc (NIH) 106; Triglycerides 113   Estimated Creatinine Clearance: 42 mL/min (A) (by C-G formula based on SCr of 1.96 mg/dL (H)).   Wt Readings from Last 3 Encounters:  02/02/20 261 lb (118.4 kg)  01/24/20 255 lb 9.6 oz (115.9 kg)  12/07/19 246 lb (111.6 kg)     Other studies reviewed: Additional studies/records reviewed today include: summarized above  ASSESSMENT AND PLAN:  1. PPM     Intact function, no programming changes made  2. Paroxysmal Afib     CHA2DS2Vasc is 5, on Pradaxa, appropriately dosed     Amiodarone  TSH mildly was high by PMD labs, defer levothyroxine to them, check LFTs  This is his 1st sustained AFib episode since Oct 2019 with only one 44 second episode in between otherwise No changes to his amiodarone He reports no missed doses of his pradaxa in the last 3 weeks taking it BID Recommended [facility], he has this before, we discussed the procedure, potential risks/benefits he wants to proceed Instructed not to miss any pradaxa doses leading to and after the DCCV, he states understanding   3. CAD     No anginal symptoms     On BB, statin, no ASA given his [place]  4. HTN     Looks OK, no changes  5. VHD  S/p TAVR     Echo last year looked OK   Disposition: He was due to see Dr. Curt Bears soon, will have him follow up in about 6 weeks post DCCV, sooner if needed  Current  medicines are reviewed at length with the patient today.  The patient did not have any concerns regarding medicines.  Venetia Night, [name] 02/02/2020 1:47 PM     [place] [id number] [phone] (office)  [phone] (fax)

## 2020-02-01 NOTE — H&P (View-Only) (Signed)
Cardiology Office Note Date:  02/02/2020  Patient ID:  Steven Maldonado, Steven Maldonado [birth year], MRN [id number] PCP:  Esaw Grandchild, NP  Cardiologist:  Dr. Angelena Form Electrophysiologist; Dr. Curt Bears    Chief Complaint:  Ongoing AFib episode  History of Present Illness: Steven Maldonado is a 79 y.o. male with history of CAD (2018, R/LHC which showed moderate 60% proximal stenosis of the left anterior descending coronary artery that was felt not to be flow limiting. There was 100% chronic occlusion of the right coronary artery. There was otherwise nonobstructive coronary artery disease), AFib, HTN, HLD, IDDM, CKD (III), chronic CHF (diastolic), OSA on CPAP, VHD severe AS s/p TAVR, CHB post TAVR w/PPM, PVD s/p left BKA.  He comes in today to be seen for Dr. Curt Bears, last seen by him Oct 2019.  He was doing well, at that time s/p BKA, maintaining SR on amiodarone.  Device remote noted an ongoing Af episode since 01/18/20, was asymptomatic and recommended he come in for evaluation.  He tells me he knew he was out of rhythm, the Afib makes him tired, easily wionded.  He denies any kind iof CP, no palpitations or cardiac awareness other then the fatigue he knw was the AFib.  No dizzy spells, no near syncope or syncope. No bleeding or signs of bleeding with his Pradaxa He reports compliance with his Pradaxa, he is certain he has not missed any doses in the last 3 weeks and longer.   He wears CPAP at night denies symptoms of PND or orthopnea.  He saw a kidney specialist at [facility] he thinks about a week or so ago now and his 3d/week lasix was increased to daily.  He follows with PMD and endocrinology as well.    Device information MDT dual chamber PPM, implanted 01/04/2018   Afib hx Diagnosed 2012 Historically seen by Dr. Rayann Heman, recommended consideration for traditional AVR with MAXE, though the patient did not want to consider this strategy for his AS  AAD Amiodarone started 2018 >> is  current  Past Medical History:  Diagnosis Date  . Chronic diastolic CHF (congestive heart failure) ([place])   . CKD (chronic kidney disease), stage III   . Constipation   . Coronary artery disease    a. Cath February 2012 in [facility], occluded RCA with collaterals  . DM type 2 (diabetes mellitus, type 2) ([place])   . Essential hypertension   . Hyperlipidemia   . Neuropathy    feet  . Pacemaker    a. symptomatic brady after TAVR s/p MDT PPM by Dr. Curt Bears 12/04/17  . Persistent atrial fibrillation ([place])   . PONV (postoperative nausea and vomiting)    after valve surgery  . PVD (peripheral vascular disease) ([place])    a. s/p R popliteal artery stenosis tx with drug-coated balloon 05/2014, followed by Dr. Fletcher Anon.  . 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 Severe aortic stenosis    a. 12/02/17: s/p TAVR  . Skin cancer   . Sleep apnea with use of continuous positive airway pressure (CPAP)    04-11-11 AHI was 32.9 and titrated to 15 cm H20, DME is [facility]  . Subclinical hypothyroidism     Past Surgical History:  Procedure Laterality Date  . ABDOMINAL ANGIOGRAM N/A 06/08/2014   Procedure: ABDOMINAL ANGIOGRAM;  Surgeon: Wellington Hampshire, MD;  [facility];  Service:  Cardiovascular;  Laterality: N/A;  . ABDOMINAL AORTOGRAM N/A 04/09/2018   Procedure: ABDOMINAL AORTOGRAM;  Surgeon: Conrad Carlton, MD;  Location: [place] CV LAB;  Service: Cardiovascular;  Laterality: N/A;  . AMPUTATION Left 04/17/2018   Procedure: LEFT FOOT 3RD RAY AMPUTATION;  Surgeon: Newt Minion, MD;  Location: [place];  Service: Orthopedics;  Laterality: Left;  . AMPUTATION Left 05/28/2018   Procedure: LEFT AMPUTATION BELOW KNEE;  Surgeon: Wylene Simmer, MD;  Location: [place];  Service: Orthopedics;  Laterality: Left;  . APPENDECTOMY  1965  . 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 CARDIAC  CATHETERIZATION  12/2010  . CARDIOVERSION  07/2011  . CARDIOVERSION N/A 04/18/2014   Procedure: CARDIOVERSION;  Surgeon: Dorothy Spark, MD;  Location: [place];  Service: Cardiovascular;  Laterality: N/A;  . CARDIOVERSION N/A 11/03/2015   Procedure: CARDIOVERSION;  Surgeon: Lelon Perla, MD;  Location: [facility] ENDOSCOPY;  Service: Cardiovascular;  Laterality: N/A;  . CARDIOVERSION N/A 05/08/2017   Procedure: CARDIOVERSION;  Surgeon: Dorothy Spark, MD;  Location: [facility] ENDOSCOPY;  Service: Cardiovascular;  Laterality: N/A;  . CARDIOVERSION N/A 07/28/2017   Procedure: CARDIOVERSION;  Surgeon: Dorothy Spark, MD;  Location: [place];  Service: Cardiovascular;  Laterality: N/A;  . LOWER EXTREMITY ANGIOGRAM N/A 06/08/2014   Procedure: LOWER EXTREMITY ANGIOGRAM;  Surgeon: Wellington Hampshire, MD;  Location: [facility] CATH LAB;  Service: Cardiovascular;  Laterality: N/A;  . LOWER EXTREMITY ANGIOGRAPHY Left 04/09/2018   Procedure: Lower Extremity Angiography;  Surgeon: Conrad Coxton, MD;  Location: [place] CV LAB;  Service: Cardiovascular;  Laterality: Left;  . PACEMAKER IMPLANT N/A 12/04/2017   Procedure: PACEMAKER IMPLANT;  Surgeon: Constance Haw, MD;  Location: [place] CV LAB;  Service: Cardiovascular;  Laterality: N/A;  . PERIPHERAL VASCULAR BALLOON ANGIOPLASTY Left 04/09/2018   Procedure: PERIPHERAL VASCULAR BALLOON ANGIOPLASTY;  Surgeon: Conrad Arapahoe, MD;  Location: [place] CV LAB;  Service: Cardiovascular;  Laterality: Left;  SFA  . POPLITEAL ARTERY ANGIOPLASTY Right 06/08/2014   Archie Endo 06/08/2014  . RIGHT/LEFT HEART CATH AND CORONARY ANGIOGRAPHY N/A 10/08/2017   Procedure: RIGHT/LEFT HEART CATH AND CORONARY ANGIOGRAPHY;  Surgeon: Burnell Blanks, MD;  Location: [place] CV LAB;  Service: Cardiovascular;  Laterality: N/A;  . SKIN CANCER EXCISION Bilateral    "have had them cut off back of neck X 2; off left upper arm; right wrist, near right shoulder blade" (06/08/2014)   . TEE WITHOUT CARDIOVERSION N/A 12/02/2017   Procedure: TRANSESOPHAGEAL ECHOCARDIOGRAM (TEE);  Surgeon: Burnell Blanks, MD;  Location: [place];  Service: Open Heart Surgery;  Laterality: N/A;  . TEMPORARY PACEMAKER N/A 12/04/2017   Procedure: TEMPORARY PACEMAKER;  Surgeon: Leonie Man, MD;  Location: [place] CV LAB;  Service: Cardiovascular;  Laterality: N/A;  . TRANSCATHETER AORTIC VALVE REPLACEMENT, TRANSFEMORAL N/A 12/02/2017   Procedure: TRANSCATHETER AORTIC VALVE REPLACEMENT, TRANSFEMORAL;  Surgeon: Burnell Blanks, MD;  Location: [place];  Service: Open Heart Surgery;  Laterality: N/A;  using Edwards Sapien 3 Transcatheter Heart Valve size 55mm    Current Outpatient Medications  Medication Sig Dispense Refill  . amiodarone (PACERONE) 200 MG tablet Take 1 tablet (200 mg total) by mouth daily. Please make overdue appt with Dr. Angelena Form before anymore refills. 1st attempt 30 tablet 0  . atorvastatin (LIPITOR) 80 MG tablet Take 1 tablet (80 mg total) by mouth at bedtime. 90 tablet 3  . BD PEN NEEDLE NANO 2ND GEN 32G X 4 MM MISC USE 4 (  FOUR) TIMES DAILY. 200 each 2  . Blood Glucose Monitoring Suppl (FREESTYLE LITE) DEVI 1 each by Does not apply route 2 (two) times daily. 1 each 0  . Choline Fenofibrate (FENOFIBRIC ACID) 135 MG CPDR TAKE 1 CAPSULE DAILY 90 capsule 3  . dabigatran (PRADAXA) 150 MG CAPS capsule Take 1 capsule (150 mg total) by mouth every 12 (twelve) hours. 180 capsule 3  . docusate sodium (COLACE) 100 MG capsule Take 100 mg by mouth 2 (two) times daily.    . Evolocumab 140 MG/ML SOSY Inject 140 mg into the skin every 14 (fourteen) days. 2.1 mL 2  . ezetimibe (ZETIA) 10 MG tablet Take 1 tablet (10 mg total) by mouth daily. 30 tablet 3  . FREESTYLE LITE test strip 1 EACH BY OTHER ROUTE 4 (FOUR) TIMES DAILY - BEFORE MEALS AND AT BEDTIME. 450 strip 3  . furosemide (LASIX) 20 MG tablet Take 1 tablet (20 mg total) by mouth daily. 90 tablet 3  . glipiZIDE (GLUCOTROL  XL) 10 MG 24 hr tablet TAKE 1 TABLET (10 MG TOTAL) BY MOUTH DAILY WITH BREAKFAST. 90 tablet 1  . Insulin Degludec (TRESIBA FLEXTOUCH) 200 UNIT/ML SOPN Inject 26 Units into the skin at bedtime. 15 mL 1  . insulin lispro (HUMALOG KWIKPEN) 100 UNIT/ML KwikPen Inject 4-6 units under the skin up to three times daily if eating sweets or high carb foods. 30 mL 1  . levothyroxine (SYNTHROID) 175 MCG tablet TAKE 1 TABLET BY MOUTH EVERY DAY 90 tablet 1  . metoprolol tartrate (LOPRESSOR) 50 MG tablet TAKE ONE AND ONE-HALF TABLET BY MOUTH TWICE A DAY 270 tablet 0  . mirabegron ER (MYRBETRIQ) 25 MG TB24 tablet Take 1 tablet (25 mg total) by mouth daily. 90 tablet 3  . mupirocin ointment (BACTROBAN) 2 % If open sores develop, apply TID 30 g 0  . pentoxifylline (TRENTAL) 400 MG CR tablet Take 1 tablet (400 mg total) by mouth 3 (three) times daily with meals. 90 tablet 3  . silver sulfADIAZINE (SILVADENE) 1 % cream Apply 1 application topically daily. 50 g 0  . Vitamin D, Ergocalciferol, (DRISDOL) 1.25 MG (50000 UNIT) CAPS capsule Take 1 capsule (50,000 Units total) by mouth every 7 (seven) days. 12 capsule 1   No current facility-administered medications for this visit.    Allergies:   Patient has no known allergies.   Social History:  The patient  reports that he quit smoking about 47 years ago. His smoking use included cigarettes. He has a 28.00 pack-year smoking history. He has never used smokeless tobacco. He reports that he does not drink alcohol or use drugs.   Family History:  The patient's family history includes Diabetes in his brother, daughter, father, mother, sister, and another family member; Heart Problems in his daughter; Heart attack in his father and mother; Heart failure in his father; Hypertension in his brother, father, mother, and sister; Stroke in his brother.  ROS:  Please see the history of present illness. All other systems are reviewed and otherwise negative.   PHYSICAL EXAM:  VS:   BP (!) 128/52   Pulse 72   Ht 5\' 11"  (1.803 m)   Wt 261 lb (118.4 kg)   BMI 36.40 kg/m  BMI: Body mass index is 36.4 kg/m. Well nourished, well developed, in no acute distress  HEENT: normocephalic, atraumatic  Neck: no JVD, carotid bruits or masses Cardiac:  RRR; (paced) no significant murmurs, no rubs, or gallops Lungs:  CTA b/l, no wheezing,  rhonchi or rales  Abd: soft, nontender, obese MS: LLE BKA w/prosthetic, Ext: 1++ edema RLE to mid shin Skin: warm and dry, no rash Neuro:  No gross deficits appreciated Psych: euthymic mood, full affect  PPM site is stable, no tethering or discomfort   EKG:  Done today and reviewed by myself Afib, V paced  PPM interrogation done today and reviewed by myself is  Battery and lead measurements are good He is in an ongoing Afib episode started 01/18/20   12/02/2018: TTE Study Conclusions  - Left ventricle: The cavity size was normal. There was moderate  concentric hypertrophy. Systolic function was normal. The  estimated ejection fraction was in the range of 60% to 65%. Wall  motion was normal; there were no regional wall motion  abnormalities. Doppler parameters are consistent with abnormal  left ventricular relaxation (grade 1 diastolic dysfunction).  Doppler parameters are consistent with elevated ventricular  end-diastolic filling pressure.  - Aortic valve: S/P TAVR with a 29 mm Edwards-SAPIEN 3 valve. Mean  gradient (S): 12 mm Hg. Peak gradient (S): 25 mm Hg. Valve area  (VTI): 1.6 cm^2. Valve area (Vmax): 1.26 cm^2. Valve area  (Vmean): 1.42 cm^2.  - Mitral valve: There was mild regurgitation.  - Left atrium: The atrium was mildly dilated.  - Right ventricle: Systolic function was normal.  - Tricuspid valve: There was no regurgitation.     10/08/2017: R/LHC  Prox RCA lesion is 100% stenosed.  Ost LAD to Prox LAD lesion is 60% stenosed.  There is severe aortic valve stenosis.  Hemodynamic findings  consistent with mild pulmonary hypertension.   1. Chronic total occlusion of the RCA. The mid and distal RCA fills from left to right collaterals.  2. Moderate stenosis proximal LAD. This does not appear to be flow limiting.  3. Severe aortic valve stenosis (mean gradient 41.8 mmHg, peak to peak gradient 44 mmHg, AVA 0.79 cm2)      Recent Labs: 05/31/2019: Hemoglobin 12.9; Platelets 146 12/03/2019: ALT 16 01/24/2020: BUN 30; Creatinine, Ser 1.96; Potassium 4.5; Sodium 147; TSH 8.390  12/03/2019: Direct LDL 123.0 01/24/2020: Chol/HDL Ratio 4.7; Cholesterol, Total 161; HDL 34; LDL Chol Calc (NIH) 106; Triglycerides 113   Estimated Creatinine Clearance: 42 mL/min (A) (by C-G formula based on SCr of 1.96 mg/dL (H)).   Wt Readings from Last 3 Encounters:  02/02/20 261 lb (118.4 kg)  01/24/20 255 lb 9.6 oz (115.9 kg)  12/07/19 246 lb (111.6 kg)     Other studies reviewed: Additional studies/records reviewed today include: summarized above  ASSESSMENT AND PLAN:  1. PPM     Intact function, no programming changes made  2. Paroxysmal Afib     CHA2DS2Vasc is 5, on Pradaxa, appropriately dosed     Amiodarone  TSH mildly was high by PMD labs, defer levothyroxine to them, check LFTs  This is his 1st sustained AFib episode since Oct 2019 with only one 44 second episode in between otherwise No changes to his amiodarone He reports no missed doses of his pradaxa in the last 3 weeks taking it BID Recommended [facility], he has this before, we discussed the procedure, potential risks/benefits he wants to proceed Instructed not to miss any pradaxa doses leading to and after the DCCV, he states understanding   3. CAD     No anginal symptoms     On BB, statin, no ASA given his [place]  4. HTN     Looks OK, no changes  5. VHD  S/p TAVR     Echo last year looked OK   Disposition: He was due to see Dr. Curt Bears soon, will have him follow up in about 6 weeks post DCCV, sooner if needed  Current  medicines are reviewed at length with the patient today.  The patient did not have any concerns regarding medicines.  Venetia Night, [name] 02/02/2020 1:47 PM     [place] [address] [phone] (office)  [phone] (fax)

## 2020-02-02 ENCOUNTER — Ambulatory Visit (INDEPENDENT_AMBULATORY_CARE_PROVIDER_SITE_OTHER): Payer: Medicare Other | Admitting: Physician Assistant

## 2020-02-02 ENCOUNTER — Encounter: Payer: Self-pay | Admitting: *Deleted

## 2020-02-02 ENCOUNTER — Other Ambulatory Visit: Payer: Self-pay

## 2020-02-02 VITALS — BP 128/52 | HR 72 | Ht 71.0 in | Wt 261.0 lb

## 2020-02-02 DIAGNOSIS — Z79899 Other long term (current) drug therapy: Secondary | ICD-10-CM

## 2020-02-02 DIAGNOSIS — Z950 Presence of cardiac pacemaker: Secondary | ICD-10-CM

## 2020-02-02 DIAGNOSIS — I10 Essential (primary) hypertension: Secondary | ICD-10-CM | POA: Diagnosis not present

## 2020-02-02 DIAGNOSIS — I480 Paroxysmal atrial fibrillation: Secondary | ICD-10-CM

## 2020-02-02 DIAGNOSIS — I2510 Atherosclerotic heart disease of native coronary artery without angina pectoris: Secondary | ICD-10-CM | POA: Diagnosis not present

## 2020-02-02 DIAGNOSIS — Z952 Presence of prosthetic heart valve: Secondary | ICD-10-CM | POA: Diagnosis not present

## 2020-02-02 LAB — BASIC METABOLIC PANEL
BUN/Creatinine Ratio: 11 (ref 10–24)
BUN: 19 mg/dL (ref 8–27)
CO2: 27 mmol/L (ref 20–29)
Calcium: 8.7 mg/dL (ref 8.6–10.2)
Chloride: 104 mmol/L (ref 96–106)
Creatinine, Ser: 1.8 mg/dL — ABNORMAL HIGH (ref 0.76–1.27)
GFR calc Af Amer: 41 mL/min/{1.73_m2} — ABNORMAL LOW (ref >59)
GFR calc non Af Amer: 36 mL/min/{1.73_m2} — ABNORMAL LOW (ref >59)
Glucose: 120 mg/dL — ABNORMAL HIGH (ref 65–99)
Potassium: 4.2 mmol/L (ref 3.5–5.2)
Sodium: 136 mmol/L (ref 134–144)

## 2020-02-02 LAB — CBC
Hematocrit: 33.8 % — ABNORMAL LOW (ref 37.5–51.0)
Hemoglobin: 11.2 g/dL — ABNORMAL LOW (ref 13.0–17.7)
MCH: 31.5 pg (ref 26.6–33.0)
MCHC: 33.1 g/dL (ref 31.5–35.7)
MCV: 95 fL (ref 79–97)
Platelets: 198 10*3/uL (ref 150–450)
RBC: 3.56 x10E6/uL — ABNORMAL LOW (ref 4.14–5.80)
RDW: 13.7 % (ref 11.6–15.4)
WBC: 6.3 10*3/uL (ref 3.4–10.8)

## 2020-02-02 LAB — HEPATIC FUNCTION PANEL
ALT: 13 IU/L (ref 0–44)
AST: 18 IU/L (ref 0–40)
Albumin: 3.6 g/dL — ABNORMAL LOW (ref 3.7–4.7)
Alkaline Phosphatase: 33 IU/L — ABNORMAL LOW (ref 39–117)
Bilirubin Total: 0.3 mg/dL (ref 0.0–1.2)
Bilirubin, Direct: <0.1 mg/dL (ref 0.00–0.40)
Total Protein: 6.1 g/dL (ref 6.0–8.5)

## 2020-02-02 NOTE — Patient Instructions (Addendum)
Medication Instructions:   Your physician recommends that you continue on your current medications as directed. Please refer to the Current Medication list given to you today.   *If you need a refill on your cardiac medications before your next appointment, please call your pharmacy*   Lab Work:   BMET LFT AND CBC TODAY   COVID SCREEN  [place]  02-04-2020 BEFORE CARDIOVERSION   If you have labs (blood work) drawn today and your tests are completely normal, you will receive your results only by: [name] Kitchen MyChart Message (if you have MyChart) OR . A paper copy in the mail If you have any lab test that is abnormal or we need to change your treatment, we will call you to review the results.   Testing/Procedures:  SEE LETTER FOR Your physician has recommended that you have a Cardioversion (DCCV). Electrical Cardioversion uses a jolt of electricity to your heart either through paddles or wired patches attached to your chest. This is a controlled, usually prescheduled, procedure. Defibrillation is done under light anesthesia in the hospital, and you usually go home the day of the procedure. This is done to get your heart back into a normal rhythm. You are not awake for the procedure. Please see the instruction sheet given to you today.   Follow-Up: At [facility], you and your health needs are our priority.  As part of our continuing mission to provide you with exceptional heart care, we have created designated Provider Care Teams.  These Care Teams include your primary Cardiologist (physician) and Advanced Practice Providers (APPs -  Physician Assistants and Nurse Practitioners) who all work together to provide you with the care you need, when you need it.  We recommend signing up for the patient portal called "MyChart".  Sign up information is provided on this After Visit Summary.  MyChart is used to connect with patients for Virtual Visits (Telemedicine).  Patients are able to view lab/test  results, encounter notes, upcoming appointments, etc.  Non-urgent messages can be sent to your provider as well.   To learn more about what you can do with MyChart, go to [email or web address].    Your next appointment:    1 month(s)  ( April)   The format for your next appointment:   In Person  Provider:   You may see Dr. Curt Bears  or one of the following Advanced Practice Providers on your designated Care Team:    Chanetta Marshall, NP  Tommye Standard, [name]  Legrand Como "Oda Kilts, [place]    Other Instructions

## 2020-02-03 NOTE — Addendum Note (Signed)
Addended by: Claude Manges on: 02/03/2020 01:13 PM   Modules accepted: Orders

## 2020-02-04 ENCOUNTER — Other Ambulatory Visit (HOSPITAL_COMMUNITY)
Admission: RE | Admit: 2020-02-04 | Discharge: 2020-02-04 | Disposition: A | Discharge status: Home / Self Care | Payer: Medicare Other | Source: Ambulatory Visit | Attending: Cardiology | Admitting: Cardiology

## 2020-02-04 DIAGNOSIS — Z20822 Contact with and (suspected) exposure to covid-19: Secondary | ICD-10-CM | POA: Insufficient documentation

## 2020-02-04 DIAGNOSIS — Z01812 Encounter for preprocedural laboratory examination: Secondary | ICD-10-CM | POA: Insufficient documentation

## 2020-02-04 LAB — SARS CORONAVIRUS 2 (TAT 6-24 HRS): SARS Coronavirus 2: NEGATIVE

## 2020-02-08 ENCOUNTER — Encounter (HOSPITAL_COMMUNITY)
Admission: RE | Disposition: A | Discharge status: Home / Self Care | Payer: Self-pay | Source: Home / Self Care | Attending: Cardiology

## 2020-02-08 ENCOUNTER — Encounter (HOSPITAL_COMMUNITY): Payer: Self-pay | Admitting: Cardiology

## 2020-02-08 ENCOUNTER — Ambulatory Visit (HOSPITAL_COMMUNITY)
Admission: RE | Admit: 2020-02-08 | Discharge: 2020-02-08 | Disposition: A | Discharge status: Home / Self Care | Payer: Medicare Other | Attending: Cardiology | Admitting: Cardiology

## 2020-02-08 ENCOUNTER — Ambulatory Visit (HOSPITAL_COMMUNITY): Payer: Medicare Other | Admitting: Certified Registered Nurse Anesthetist

## 2020-02-08 DIAGNOSIS — Z794 Long term (current) use of insulin: Secondary | ICD-10-CM | POA: Diagnosis not present

## 2020-02-08 DIAGNOSIS — Z79899 Other long term (current) drug therapy: Secondary | ICD-10-CM | POA: Insufficient documentation

## 2020-02-08 DIAGNOSIS — I4891 Unspecified atrial fibrillation: Secondary | ICD-10-CM | POA: Diagnosis not present

## 2020-02-08 DIAGNOSIS — I480 Paroxysmal atrial fibrillation: Secondary | ICD-10-CM | POA: Diagnosis not present

## 2020-02-08 DIAGNOSIS — I130 Hypertensive heart and chronic kidney disease with heart failure and stage 1 through stage 4 chronic kidney disease, or unspecified chronic kidney disease: Secondary | ICD-10-CM | POA: Diagnosis not present

## 2020-02-08 DIAGNOSIS — I2582 Chronic total occlusion of coronary artery: Secondary | ICD-10-CM | POA: Insufficient documentation

## 2020-02-08 DIAGNOSIS — N183 Chronic kidney disease, stage 3 unspecified: Secondary | ICD-10-CM | POA: Insufficient documentation

## 2020-02-08 DIAGNOSIS — I4819 Other persistent atrial fibrillation: Secondary | ICD-10-CM | POA: Insufficient documentation

## 2020-02-08 DIAGNOSIS — E1151 Type 2 diabetes mellitus with diabetic peripheral angiopathy without gangrene: Secondary | ICD-10-CM | POA: Insufficient documentation

## 2020-02-08 DIAGNOSIS — I2510 Atherosclerotic heart disease of native coronary artery without angina pectoris: Secondary | ICD-10-CM | POA: Insufficient documentation

## 2020-02-08 DIAGNOSIS — N189 Chronic kidney disease, unspecified: Secondary | ICD-10-CM | POA: Diagnosis not present

## 2020-02-08 DIAGNOSIS — G4730 Sleep apnea, unspecified: Secondary | ICD-10-CM | POA: Insufficient documentation

## 2020-02-08 DIAGNOSIS — I5032 Chronic diastolic (congestive) heart failure: Secondary | ICD-10-CM | POA: Diagnosis not present

## 2020-02-08 DIAGNOSIS — E1140 Type 2 diabetes mellitus with diabetic neuropathy, unspecified: Secondary | ICD-10-CM | POA: Insufficient documentation

## 2020-02-08 DIAGNOSIS — Z87891 Personal history of nicotine dependence: Secondary | ICD-10-CM | POA: Insufficient documentation

## 2020-02-08 DIAGNOSIS — E039 Hypothyroidism, unspecified: Secondary | ICD-10-CM | POA: Insufficient documentation

## 2020-02-08 DIAGNOSIS — Z7901 Long term (current) use of anticoagulants: Secondary | ICD-10-CM | POA: Insufficient documentation

## 2020-02-08 DIAGNOSIS — E785 Hyperlipidemia, unspecified: Secondary | ICD-10-CM | POA: Diagnosis not present

## 2020-02-08 DIAGNOSIS — Z79890 Hormone replacement therapy (postmenopausal): Secondary | ICD-10-CM | POA: Diagnosis not present

## 2020-02-08 DIAGNOSIS — I350 Nonrheumatic aortic (valve) stenosis: Secondary | ICD-10-CM | POA: Diagnosis not present

## 2020-02-08 DIAGNOSIS — I509 Heart failure, unspecified: Secondary | ICD-10-CM | POA: Diagnosis not present

## 2020-02-08 DIAGNOSIS — E1122 Type 2 diabetes mellitus with diabetic chronic kidney disease: Secondary | ICD-10-CM | POA: Diagnosis not present

## 2020-02-08 DIAGNOSIS — Z950 Presence of cardiac pacemaker: Secondary | ICD-10-CM | POA: Insufficient documentation

## 2020-02-08 HISTORY — PX: CARDIOVERSION: SHX1299

## 2020-02-08 LAB — GLUCOSE, CAPILLARY: Glucose-Capillary: 118 mg/dL — ABNORMAL HIGH (ref 70–99)

## 2020-02-08 SURGERY — CARDIOVERSION
Anesthesia: General

## 2020-02-08 MED ORDER — PROPOFOL 10 MG/ML IV BOLUS
INTRAVENOUS | Status: DC | PRN
  Administered 2020-02-08: 60 mg via INTRAVENOUS

## 2020-02-08 MED ORDER — SODIUM CHLORIDE 0.9 % IV SOLN: INTRAVENOUS | Status: DC | PRN

## 2020-02-08 MED ORDER — LIDOCAINE 2% (20 MG/ML) 5 ML SYRINGE
INTRAMUSCULAR | Status: DC | PRN
  Administered 2020-02-08: 100 mg via INTRAVENOUS

## 2020-02-08 NOTE — Anesthesia Procedure Notes (Signed)
Procedure Name: General with mask airway Date/Time: 02/08/2020 10:03 AM Performed by: Janene Harvey, CRNA Pre-anesthesia Checklist: Patient identified, Emergency Drugs available, Suction available and Patient being monitored Patient Re-evaluated:Patient Re-evaluated prior to induction Oxygen Delivery Method: Ambu bag Placement Confirmation: positive ETCO2 Dental Injury: Teeth and Oropharynx as per pre-operative assessment

## 2020-02-08 NOTE — Discharge Instructions (Signed)
   What can I expect after the procedure?  Your blood pressure, heart rate, breathing rate, and blood oxygen level will be monitored until you leave the hospital or clinic.  Your heart rhythm will be watched to make sure it does not change.  You may have some redness on the skin where the shocks were given. Follow these instructions at home:  Do not drive for 24 hours if you were given a sedative during your procedure.  Take over-the-counter and prescription medicines only as told by your health care provider.  Ask your health care provider how to check your pulse. Check it often.  Rest for 48 hours after the procedure or as told by your health care provider.  Avoid or limit your caffeine use as told by your health care provider.  Keep all follow-up visits as told by your health care provider. This is important. Contact a health care provider if:  You feel like your heart is beating too quickly or your pulse is not regular.  You have a serious muscle cramp that does not go away. Get help right away if:  You have discomfort in your chest.  You are dizzy or you feel faint.  You have trouble breathing or you are short of breath.  Your speech is slurred.  You have trouble moving an arm or leg on one side of your body.  Your fingers or toes turn cold or blue. Summary  Electrical cardioversion is the delivery of a jolt of electricity to restore a normal rhythm to the heart.  This procedure may be done right away in an emergency or may be a scheduled procedure if the condition is not an emergency.  Generally, this is a safe procedure.  After the procedure, check your pulse often as told by your health care provider. This information is not intended to replace advice given to you by your health care provider. Make sure you discuss any questions you have with your health care provider. Document Revised: 05/31/2019 Document Reviewed: 05/31/2019 Elsevier Patient Education   [place].

## 2020-02-08 NOTE — Anesthesia Preprocedure Evaluation (Addendum)
Anesthesia Evaluation  Patient identified by MRN, date of birth, ID band Patient awake    Reviewed: Allergy & Precautions, NPO status , Patient's Chart, lab work & pertinent test results  History of Anesthesia Complications (+) PONV  Airway Mallampati: III  TM Distance: >3 FB Neck ROM: Full    Dental no notable dental hx. (+) Edentulous Upper, Edentulous Lower, Upper Dentures, Lower Dentures   Pulmonary sleep apnea and Continuous Positive Airway Pressure Ventilation , former smoker,    Pulmonary exam normal breath sounds clear to auscultation       Cardiovascular hypertension, + CAD, + Peripheral Vascular Disease and +CHF  negative cardio ROS Normal cardiovascular exam+ pacemaker  Rhythm:Regular Rate:Normal     Neuro/Psych negative neurological ROS  negative psych ROS   GI/Hepatic negative GI ROS, Neg liver ROS,   Endo/Other  diabetes, Type 2Hypothyroidism   Renal/GU CRFRenal disease  negative genitourinary   Musculoskeletal negative musculoskeletal ROS (+)   Abdominal   Peds negative pediatric ROS (+)  Hematology negative hematology ROS (+)   Anesthesia Other Findings   Reproductive/Obstetrics negative OB ROS                                                             Anesthesia Evaluation  Patient identified by MRN, date of birth, ID band Patient awake    Reviewed: Allergy & Precautions, H&P , NPO status , Patient's Chart, lab work & pertinent test results  History of Anesthesia Complications (+) PONV and history of anesthetic complications  Airway Mallampati: II  TM Distance: >3 FB Neck ROM: full    Dental  (+) Dental Advisory Given, Edentulous Upper, Edentulous Lower   Pulmonary sleep apnea , former smoker,    breath sounds clear to auscultation       Cardiovascular hypertension, + CAD, + Past MI, + Peripheral Vascular Disease and +CHF  + dysrhythmias Atrial  Fibrillation + Valvular Problems/Murmurs AS  Rhythm:regular Rate:Normal  Echo 2/19 Study Conclusions  - Left ventricle: Inferobasal hypokinesis The cavity size was   normal. Wall thickness was increased in a pattern of severe LVH.   Systolic function was normal. The estimated ejection fraction was   in the range of 55% to 60%. - Aortic valve: Post TAVR with no significant peri valvualr   regurgitation gradients have increased since 12/03/17. - Mitral valve: Severely calcified annulus. Moderately thickened,   moderately calcified    Neuro/Psych    GI/Hepatic   Endo/Other  diabetes, Type 2Hypothyroidism Morbid obesity  Renal/GU Renal InsufficiencyRenal disease     Musculoskeletal   Abdominal   Peds  Hematology   Anesthesia Other Findings   Reproductive/Obstetrics                            Anesthesia Physical  Anesthesia Plan  ASA: III  Anesthesia Plan: General   Post-op Pain Management:    Induction: Intravenous  PONV Risk Score and Plan: 4 or greater and Ondansetron, Dexamethasone, Treatment may vary due to age or medical condition, Scopolamine patch - Pre-op and Diphenhydramine  Airway Management Planned: LMA  Additional Equipment:   Intra-op Plan:   Post-operative Plan: Extubation in OR  Informed Consent: I have reviewed the patients History and Physical, chart, labs and discussed  the procedure including the risks, benefits and alternatives for the proposed anesthesia with the patient or authorized representative who has indicated his/her understanding and acceptance.   Dental advisory given  Plan Discussed with: CRNA, Anesthesiologist and Surgeon  Anesthesia Plan Comments:        Anesthesia Quick Evaluation  Anesthesia Physical Anesthesia Plan  ASA: IV  Anesthesia Plan: General   Post-op Pain Management:    Induction: Intravenous  PONV Risk Score and Plan: 2  Airway Management Planned: Nasal Cannula, Simple  Face Mask, Natural Airway and Mask  Additional Equipment:   Intra-op Plan:   Post-operative Plan:   Informed Consent:   Plan Discussed with: Anesthesiologist and CRNA  Anesthesia Plan Comments: (  )        Anesthesia Quick Evaluation

## 2020-02-08 NOTE — Transfer of Care (Signed)
Immediate Anesthesia Transfer of Care Note  Patient: Steven Maldonado  Procedure(s) Performed: CARDIOVERSION (N/A )  Patient Location: Endoscopy Unit  Anesthesia Type:General  Level of Consciousness: drowsy  Airway & Oxygen Therapy: Patient Spontanous Breathing and Patient connected to nasal cannula oxygen  Post-op Assessment: Report given to RN and Post -op Vital signs reviewed and stable  Post vital signs: Reviewed  Last Vitals:  Vitals Value Taken Time  BP    Temp    Pulse    Resp    SpO2      Last Pain:  Vitals:   02/08/20 0859  TempSrc: Oral  PainSc: 0-No pain         Complications: No apparent anesthesia complications

## 2020-02-08 NOTE — Anesthesia Postprocedure Evaluation (Signed)
Anesthesia Post Note  Patient: Steven Maldonado  Procedure(s) Performed: CARDIOVERSION (N/A )     Patient location during evaluation: PACU Anesthesia Type: General Level of consciousness: awake and alert Pain management: pain level controlled Vital Signs Assessment: post-procedure vital signs reviewed and stable Respiratory status: spontaneous breathing, nonlabored ventilation, respiratory function stable and patient connected to nasal cannula oxygen Cardiovascular status: blood pressure returned to baseline and stable Postop Assessment: no apparent nausea or vomiting Anesthetic complications: no    Last Vitals:  Vitals:   02/08/20 1012 02/08/20 1020  BP: (!) 122/51 (!) 117/44  Pulse:  60  Resp: 16 15  Temp: 36.6 C   SpO2: 99% 97%    Last Pain:  Vitals:   02/08/20 1020  TempSrc:   PainSc: 0-No pain                 Phyllis Whitefield

## 2020-02-08 NOTE — CV Procedure (Signed)
Procedure:   DCCV  Indication:  Symptomatic atrial fibrillation  Procedure Note:  The patient signed informed consent.  They have had had therapeutic anticoagulation with pradaxa greater than 3 weeks.  Anesthesia was administered by Dr. Ambrose Pancoast.  He received 60 mg of propofol and 100 mg of lidocaine. Adequate airway was maintained throughout and vital followed per protocol.  They were cardioverted x 1 with 150J of biphasic synchronized energy.  They converted to atrial-paced, ventricular-paced rhythm.  This was confirmed by interrogation of his Medtronic pacemaker. There were no apparent complications.  The patient had normal neuro status and respiratory status post procedure with vitals stable as recorded elsewhere.    Follow up:  They will continue on current medical therapy and follow up with cardiology as scheduled.  Buford Dresser, MD PhD 02/08/2020 10:18 AM

## 2020-02-08 NOTE — Interval H&P Note (Signed)
History and Physical Interval Note:  02/08/2020 9:22 AM  Steven Maldonado  has presented today for surgery, with the diagnosis of AFIB.  The various methods of treatment have been discussed with the patient and family. After consideration of risks, benefits and other options for treatment, the patient has consented to  Procedure(s): CARDIOVERSION (N/A) as a surgical intervention.  The patient's history has been reviewed, patient examined, no change in status, stable for surgery.  I have reviewed the patient's chart and labs.  Questions were answered to the patient's satisfaction.     Tawanna Funk Harrell Gave

## 2020-02-26 ENCOUNTER — Other Ambulatory Visit: Payer: Self-pay | Admitting: Cardiovascular Disease

## 2020-02-28 ENCOUNTER — Other Ambulatory Visit: Payer: Self-pay | Admitting: Family Medicine

## 2020-02-28 ENCOUNTER — Other Ambulatory Visit: Payer: Self-pay | Admitting: Adult Health

## 2020-02-28 ENCOUNTER — Telehealth: Payer: Self-pay

## 2020-02-28 MED ORDER — FENOFIBRIC ACID 135 MG PO CPDR: 1.0000 | DELAYED_RELEASE_CAPSULE | Freq: Every day | ORAL | 0 refills | Status: DC

## 2020-02-28 MED ORDER — MIRABEGRON ER 25 MG PO TB24: 25.0000 mg | ORAL_TABLET | Freq: Every day | ORAL | 0 refills | Status: DC

## 2020-02-28 NOTE — Telephone Encounter (Signed)
[facility] alert received for ongoing AF episode that started 02/26/20 at 1539.  Pt has known history of AF with recent DCCV on 3/30.  Pt is on Pradaxa for [place].  Current meds include Metoprolol 75mg  BID and Amiodarone 200mg  daily.    Spoke with pt and his spouse, he was not aware of being in Af this episode, he denies any current symptoms.  While on phone pt sent a manual transmission that confirms he is still in AF as of this time. Pt spouse confirms compliance with current meds.     Pt has scheduled appt with Dr. Curt Bears on 4/26 as f/u to DCCV.  Stressed importance of keeping appt on 4/26.  Will notify MD of current status.

## 2020-02-28 NOTE — Telephone Encounter (Signed)
Yes ok to wait

## 2020-02-28 NOTE — Telephone Encounter (Signed)
Needs follow up in AF clinic or with EP app to discuss repeat cardioversion.

## 2020-02-28 NOTE — Telephone Encounter (Signed)
Patient seen by you 01/24/20 and advised to follow up in 4 months.   Patient requesting refill of Myrbetriq and Choline Fenofibrate. Please approve if refill appropriate. AS, CMA

## 2020-02-28 NOTE — Telephone Encounter (Signed)
Pt stopped by office request refill on these ( 2 ) med    1)---- mirabegron ER (MYRBETRIQ) 25 MG TB24 tablet [id number]   Order Details Dose: 25 mg Route: Oral Frequency: Daily  Dispense Quantity: 90 tablet Refills: 3       Sig: Take 1 tablet (25 mg total) by mouth daily.   &  2)--- Choline Fenofibrate (FENOFIBRIC ACID) 135 MG CPDR [id number]   Order Details Dose, Route, Frequency: As Directed  Dispense Quantity: 90 capsule Refills: 3       Sig: TAKE 1 CAPSULE DAILY  Patient taking differently: Take 135 mg by mouth daily.       Start Date: 09/17/18 End Date: --    ----Forwarding request to med asst to send refill request to :  [facility] [place] [facility] [place] to [employer] [phone] (Phone) [phone] (Fax   --glh

## 2020-02-29 ENCOUNTER — Other Ambulatory Visit: Payer: Self-pay | Admitting: Endocrinology

## 2020-03-06 ENCOUNTER — Other Ambulatory Visit: Payer: Medicare Other

## 2020-03-06 ENCOUNTER — Encounter: Payer: Self-pay | Admitting: Cardiology

## 2020-03-06 ENCOUNTER — Other Ambulatory Visit: Payer: Self-pay

## 2020-03-06 ENCOUNTER — Ambulatory Visit (INDEPENDENT_AMBULATORY_CARE_PROVIDER_SITE_OTHER): Payer: Medicare Other | Admitting: Cardiology

## 2020-03-06 VITALS — BP 134/60 | HR 75 | Ht 71.0 in | Wt 245.0 lb

## 2020-03-06 DIAGNOSIS — I469 Cardiac arrest, cause unspecified: Secondary | ICD-10-CM

## 2020-03-06 DIAGNOSIS — I2510 Atherosclerotic heart disease of native coronary artery without angina pectoris: Secondary | ICD-10-CM | POA: Diagnosis not present

## 2020-03-06 DIAGNOSIS — Z01812 Encounter for preprocedural laboratory examination: Secondary | ICD-10-CM

## 2020-03-06 DIAGNOSIS — I4819 Other persistent atrial fibrillation: Secondary | ICD-10-CM | POA: Diagnosis not present

## 2020-03-06 LAB — CUP PACEART INCLINIC DEVICE CHECK
Battery Remaining Longevity: 105 mo
Battery Voltage: 2.93 V
Brady Statistic AP VP Percent: 36.23 %
Brady Statistic AP VS Percent: 0.01 %
Brady Statistic AS VP Percent: 63.47 %
Brady Statistic AS VS Percent: 0.18 %
Brady Statistic RA Percent Paced: 24.55 %
Brady Statistic RV Percent Paced: 99.78 %
Date Time Interrogation Session: 20210426152700
Implantable Lead Implant Date: 20190124
Implantable Lead Implant Date: 20190124
Implantable Lead Location: 753859
Implantable Lead Location: 753860
Implantable Lead Model: 5076
Implantable Lead Model: 5076
Implantable Pulse Generator Implant Date: 20190124
Lead Channel Impedance Value: 323 Ohm
Lead Channel Impedance Value: 361 Ohm
Lead Channel Impedance Value: 361 Ohm
Lead Channel Impedance Value: 456 Ohm
Lead Channel Pacing Threshold Amplitude: 0.625 V
Lead Channel Pacing Threshold Amplitude: 0.625 V
Lead Channel Pacing Threshold Pulse Width: 0.4 ms
Lead Channel Pacing Threshold Pulse Width: 0.4 ms
Lead Channel Sensing Intrinsic Amplitude: 0.25 mV
Lead Channel Sensing Intrinsic Amplitude: 18.375 mV
Lead Channel Setting Pacing Amplitude: 2 V
Lead Channel Setting Pacing Amplitude: 2.5 V
Lead Channel Setting Pacing Pulse Width: 0.4 ms
Lead Channel Setting Sensing Sensitivity: 4 mV

## 2020-03-06 MED ORDER — AMIODARONE HCL 200 MG PO TABS: ORAL_TABLET | ORAL | 0 refills | Status: DC

## 2020-03-06 NOTE — H&P (View-Only) (Signed)
Electrophysiology Office Note   Date:  03/07/2020   ID:  Steven Maldonado, Steven Maldonado 04/06/1941, MRN [id number]  PCP:  Patient, No Pcp Per  Cardiologist:  Angelena Form Primary Electrophysiologist:  Nessie Nong Meredith Leeds, MD    No chief complaint on file.    History of Present Illness: Steven Maldonado is a 79 y.o. male who is being seen today for the evaluation of complete AV block at the request of Darlina Guys. Presenting today for electrophysiology evaluation. 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  Today, denies symptoms of palpitations, chest pain, orthopnea, PND, lower extremity edema, claudication, dizziness, presyncope, syncope, bleeding, or neurologic sequela. The patient is tolerating medications without difficulties.  Unfortunately he is back in atrial fibrillation.  In terms her weakness, fatigue.  He also has dyspnea on exertion.  He does not have orthopnea or PND.  He noticed this 2 to 3 weeks ago.    Past Medical History:  Diagnosis Date  . Chronic diastolic CHF (congestive heart failure) ([place])   . CKD (chronic kidney disease), stage III   . Constipation   . Coronary artery disease    a. Cath February 2012 in [place], occluded RCA with collaterals  . DM type 2 (diabetes mellitus, type 2) ([place])   . Essential hypertension   . Hyperlipidemia   . Neuropathy    feet  . Pacemaker    a. symptomatic brady after TAVR s/p MDT PPM by Dr. Curt Bears 12/04/17  . Persistent atrial fibrillation ([place])   . PONV (postoperative nausea and vomiting)    after valve surgery  . PVD (peripheral vascular disease) ([place])    a. s/p R popliteal artery stenosis tx with drug-coated balloon 05/2014, followed by Dr. Fletcher Anon.  . 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 Severe aortic stenosis    a. 12/02/17: s/p TAVR  . Skin cancer   . Sleep apnea with use of continuous positive airway pressure (CPAP)    04-11-11 AHI was 32.9 and titrated to 15 cm H20, DME is [facility]  . Subclinical hypothyroidism    Past Surgical History:  Procedure Laterality Date  . ABDOMINAL ANGIOGRAM N/A 06/08/2014   Procedure: ABDOMINAL ANGIOGRAM;  Surgeon: Wellington Hampshire, MD;  Location: [facility] CATH LAB;  Service: Cardiovascular;  Laterality: N/A;  . ABDOMINAL AORTOGRAM N/A 04/09/2018   Procedure: ABDOMINAL AORTOGRAM;  Surgeon: Conrad [place], MD;  Location: [place] CV LAB;  Service: Cardiovascular;  Laterality: N/A;  . AMPUTATION Left 04/17/2018   Procedure: LEFT FOOT 3RD RAY AMPUTATION;  Surgeon: Newt Minion, MD;  Location: [place];  Service: Orthopedics;  Laterality: Left;  . AMPUTATION Left 05/28/2018   Procedure: LEFT AMPUTATION BELOW KNEE;  Surgeon: Wylene Simmer, MD;  Location: [place];  Service: Orthopedics;  Laterality: Left;  . APPENDECTOMY  1965  . 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 CARDIAC CATHETERIZATION  12/2010  . CARDIOVERSION  07/2011  . CARDIOVERSION N/A 04/18/2014   Procedure: CARDIOVERSION;  Surgeon: Dorothy Spark, MD;  Location: [facility] ENDOSCOPY;  Service: Cardiovascular;  Laterality: N/A;  . CARDIOVERSION N/A 11/03/2015   Procedure: CARDIOVERSION;  Surgeon: Lelon Perla, MD;  Location: [facility] ENDOSCOPY;  Service: Cardiovascular;  Laterality: N/A;  . CARDIOVERSION N/A 05/08/2017   Procedure: CARDIOVERSION;  Surgeon:  Dorothy Spark, MD;  Location: [facility] ENDOSCOPY;  Service: Cardiovascular;  Laterality: N/A;  . CARDIOVERSION N/A 07/28/2017   Procedure: CARDIOVERSION;  Surgeon: Dorothy Spark, MD;  Location[facility] ENDOSCOPY;  Service: Cardiovascular;  Laterality: N/A;  . CARDIOVERSION N/A 02/08/2020   Procedure: CARDIOVERSION;  Surgeon: Buford Dresser, MD;  Location: [employer];  Service: Cardiovascular;  Laterality: N/A;  . LOWER EXTREMITY  ANGIOGRAM N/A 06/08/2014   Procedure: LOWER EXTREMITY ANGIOGRAM;  Surgeon: Wellington Hampshire, MD;  Location: [place] CATH LAB;  Service: Cardiovascular;  Laterality: N/A;  . LOWER EXTREMITY ANGIOGRAPHY Left 04/09/2018   Procedure: Lower Extremity Angiography;  Surgeon: Conrad [place], MD;  Location: [place] CV LAB;  Service: Cardiovascular;  Laterality: Left;  . PACEMAKER IMPLANT N/A 12/04/2017   Procedure: PACEMAKER IMPLANT;  Surgeon: Constance Haw, MD;  Location: [place] CV LAB;  Service: Cardiovascular;  Laterality: N/A;  . PERIPHERAL VASCULAR BALLOON ANGIOPLASTY Left 04/09/2018   Procedure: PERIPHERAL VASCULAR BALLOON ANGIOPLASTY;  Surgeon: Conrad [place], MD;  Location: [place] CV LAB;  Service: Cardiovascular;  Laterality: Left;  SFA  . POPLITEAL ARTERY ANGIOPLASTY Right 06/08/2014   Archie Endo 06/08/2014  . RIGHT/LEFT HEART CATH AND CORONARY ANGIOGRAPHY N/A 10/08/2017   Procedure: RIGHT/LEFT HEART CATH AND CORONARY ANGIOGRAPHY;  Surgeon: Burnell Blanks, MD;  Location: [place] CV LAB;  Service: Cardiovascular;  Laterality: N/A;  . SKIN CANCER EXCISION Bilateral    "have had them cut off back of neck X 2; off left upper arm; right wrist, near right shoulder blade" (06/08/2014)  . TEE WITHOUT CARDIOVERSION N/A 12/02/2017   Procedure: TRANSESOPHAGEAL ECHOCARDIOGRAM (TEE);  Surgeon: Burnell Blanks, MD;  Location: [place];  Service: Open Heart Surgery;  Laterality: N/A;  . TEMPORARY PACEMAKER N/A 12/04/2017   Procedure: TEMPORARY PACEMAKER;  Surgeon: Leonie Man, MD;  Location: [place] CV LAB;  Service: Cardiovascular;  Laterality: N/A;  . TRANSCATHETER AORTIC VALVE REPLACEMENT, TRANSFEMORAL N/A 12/02/2017   Procedure: TRANSCATHETER AORTIC VALVE REPLACEMENT, TRANSFEMORAL;  Surgeon: Burnell Blanks, MD;  Location: [place];  Service: Open Heart Surgery;  Laterality: N/A;  using Edwards Sapien 3 Transcatheter Heart Valve size 57mm     Current Outpatient  Medications  Medication Sig Dispense Refill  . amiodarone (PACERONE) 200 MG tablet Take 1 tablet (200 mg total) by mouth daily. Please make overdue appt with Dr. Angelena Form before anymore refills. 2nd attempt 15 tablet 0  . atorvastatin (LIPITOR) 80 MG tablet Take 1 tablet (80 mg total) by mouth at bedtime. 90 tablet 3  . BD PEN NEEDLE NANO 2ND GEN 32G X 4 MM MISC USE 4 (FOUR) TIMES DAILY. 200 each 2  . Blood Glucose Monitoring Suppl (FREESTYLE LITE) DEVI 1 each by Does not apply route 2 (two) times daily. 1 each 0  . Choline Fenofibrate (FENOFIBRIC ACID) 135 MG CPDR Take 1 capsule by mouth daily. 90 capsule 0  . dabigatran (PRADAXA) 150 MG CAPS capsule Take 1 capsule (150 mg total) by mouth every 12 (twelve) hours. 180 capsule 3  . docusate sodium (COLACE) 100 MG capsule Take 100 mg by mouth 2 (two) times daily.    . Evolocumab 140 MG/ML SOSY Inject 140 mg into the skin every 14 (fourteen) days. 2.1 mL 2  . ezetimibe (ZETIA) 10 MG tablet TAKE 1 TABLET BY MOUTH EVERY DAY 90 tablet 1  . FREESTYLE LITE test strip 1 EACH BY OTHER ROUTE 4 (FOUR) TIMES DAILY - BEFORE MEALS AND AT BEDTIME. 450 strip 3  .  furosemide (LASIX) 20 MG tablet Take 1 tablet (20 mg total) by mouth daily. 90 tablet 3  . glipiZIDE (GLUCOTROL XL) 10 MG 24 hr tablet TAKE 1 TABLET (10 MG TOTAL) BY MOUTH DAILY WITH BREAKFAST. 90 tablet 1  . Insulin Degludec (TRESIBA FLEXTOUCH) 200 UNIT/ML SOPN Inject 26 Units into the skin at bedtime. 15 mL 1  . insulin lispro (HUMALOG KWIKPEN) 100 UNIT/ML KwikPen Inject 4-6 units under the skin up to three times daily if eating sweets or high carb foods. (Patient taking differently: Inject 5 Units into the skin 3 (three) times daily. ) 30 mL 1  . levothyroxine (SYNTHROID) 175 MCG tablet TAKE 1 TABLET BY MOUTH EVERY DAY (Patient taking differently: Take 175 mcg by mouth daily before breakfast. ) 90 tablet 1  . metoprolol tartrate (LOPRESSOR) 50 MG tablet TAKE ONE AND ONE-HALF TABLET BY MOUTH TWICE A DAY  (Patient taking differently: Take 75 mg by mouth 2 (two) times daily. ) 270 tablet 0  . mirabegron ER (MYRBETRIQ) 25 MG TB24 tablet Take 1 tablet (25 mg total) by mouth daily. 90 tablet 0  . mupirocin ointment (BACTROBAN) 2 % If open sores develop, apply TID 30 g 0  . pentoxifylline (TRENTAL) 400 MG CR tablet Take 1 tablet (400 mg total) by mouth 3 (three) times daily with meals. 90 tablet 3  . silver sulfADIAZINE (SILVADENE) 1 % cream Apply 1 application topically daily. 50 g 0  . Vitamin D, Ergocalciferol, (DRISDOL) 1.25 MG (50000 UNIT) CAPS capsule Take 1 capsule (50,000 Units total) by mouth every 7 (seven) days. 12 capsule 1  . amiodarone (PACERONE) 200 MG tablet Increase Amiodarone to 400 mg once daily for a month, then reduce back to 200 mg once a day 30 tablet 0   No current facility-administered medications for this visit.    Allergies:   Patient has no known allergies.   Social History:  The patient  reports that he quit smoking about 47 years ago. His smoking use included cigarettes. He has a 28.00 pack-year smoking history. He has never used smokeless tobacco. He reports that he does not drink alcohol or use drugs.   Family History:  The patient's family history includes Diabetes in his brother, daughter, father, mother, sister, and another family member; Heart Problems in his daughter; Heart attack in his father and mother; Heart failure in his father; Hypertension in his brother, father, mother, and sister; Stroke in his brother.    ROS:  Please see the history of present illness.   Otherwise, review of systems is positive for none.   All other systems are reviewed and negative.   PHYSICAL EXAM: VS:  BP 134/60   Pulse 75   Ht 5\' 11"  (1.803 m)   Wt 245 lb (111.1 kg)   BMI 34.17 kg/m  , BMI Body mass index is 34.17 kg/m. GEN: Well nourished, well developed, in no acute distress  HEENT: normal  Neck: no JVD, carotid bruits, or masses Cardiac: RRR; no murmurs, rubs, or  gallops,no edema  Respiratory:  clear to auscultation bilaterally, normal work of breathing GI: soft, nontender, nondistended, + BS MS: no deformity or atrophy  Skin: warm and dry, device site well healed Neuro:  Strength and sensation are intact Psych: euthymic mood, full affect  EKG:  EKG is ordered today. Personal review of the ekg ordered shows AF, ventricular paced  Personal review of the device interrogation today. Results in [place]: 01/24/2020: TSH 8.390 02/02/2020:  ALT 13 03/06/2020: BUN 30; Creatinine, Ser 1.90; Hemoglobin 12.7; Platelets 166; Potassium 4.4; Sodium 138    Lipid Panel     Component Value Date/Time   CHOL 161 01/24/2020 1045   TRIG 113 01/24/2020 1045   HDL 34 (L) 01/24/2020 1045   CHOLHDL 4.7 01/24/2020 1045   CHOLHDL 6.1 (H) 05/31/2019 1246   VLDL 43.8 (H) 08/18/2018 1500   LDLCALC 106 (H) 01/24/2020 1045   LDLCALC 142 (H) 05/31/2019 1246   LDLDIRECT 123.0 12/03/2019 1136     Wt Readings from Last 3 Encounters:  03/06/20 245 lb (111.1 kg)  02/08/20 260 lb (117.9 kg)  02/02/20 261 lb (118.4 kg)      Other studies Reviewed: Additional studies/ records that were reviewed today include: TTE 12/24/17  Review of the above records today demonstrates:  - Left ventricle: Inferobasal hypokinesis The cavity size was   normal. Wall thickness was increased in a pattern of severe LVH.   Systolic function was normal. The estimated ejection fraction was   in the range of 55% to 60%. - Aortic valve: Post TAVR with no significant peri valvualr   regurgitation gradients have increased since 12/03/17. - Mitral valve: Severely calcified annulus. Moderately thickened,   moderately calcified leaflets . There was mild regurgitation. - Left atrium: The atrium was moderately dilated. - Atrial septum: No defect or patent foramen ovale was identified.   ASSESSMENT AND PLAN:  1.  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  Device functioning appropriately.  No changes at this time.    2. Paroxysmal atrial fibrillation: On amiodarone and Pradaxa.  CHA2DS2-VASc of 4.  He is gone back into atrial fibrillation.  Due to that, we Latania Bascomb plan for repeat cardioversion with an increased dose of amiodarone.  If he goes back out of rhythm, ablation may be an option, though he would be at a little bit higher risk.  3. Hypertension: Currently well controlled  4. Coronary artery disease: No current chest pain.  Continue with current management.  5. Hyperlipidemia: Continue statin  6. OSA: CPAP compliance encouraged  Current medicines are reviewed at length with the patient today.   The patient does not have concerns regarding his medicines.  The following changes were made today: Increase amiodarone  Labs/ tests ordered today include:  Orders Placed This Encounter  Procedures  . Basic metabolic panel  . CBC  . CUP PACEART [place]  . EKG 12-Lead     Disposition:   FU with Koreen Lizaola 3 months  Signed, Carime Dinkel Meredith Leeds, MD  03/07/2020 7:13 AM     [facility] [address] [place] [address] [phone] (office) [phone] (fax)

## 2020-03-06 NOTE — Patient Instructions (Addendum)
Medication Instructions:  Your physician has recommended you make the following change in your medication:  1. INCREASE Amiodarone to 400 mg daily for 1 month, then reduce back to 200 mg daily  *If you need a refill on your cardiac medications before your next appointment, please call your pharmacy*   Lab Work: Pre procedure labs today: BMET & CBC If you have labs (blood work) drawn today and your tests are completely normal, you will receive your results only by:  [place] (if you have MyChart) OR  A paper copy in the mail If you have any lab test that is abnormal or we need to change your treatment, we will call you to review the results.   Testing/Procedures: Your physician has recommended that you have a Cardioversion (DCCV). Electrical Cardioversion uses a jolt of electricity to your heart either through paddles or wired patches attached to your chest. This is a controlled, usually prescheduled, procedure. Defibrillation is done under light anesthesia in the hospital, and you usually go home the day of the procedure. This is done to get your heart back into a normal rhythm. You are not awake for the procedure.  Please review instructions below under "other instructions".   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 This monitoring reduces the number of office visits required to check your device to one time per year. It allows [place] to keep an eye on the functioning of your device to ensure it is working properly. You are scheduled for a device check from home on 03/17/2020. You may send your transmission at any time that day. If you have a wireless device, the transmission will be sent automatically. After your physician reviews your transmission, you will receive a postcard with your next transmission date.  At [facility], you and your health needs are our priority.  As part of our continuing mission to provide you with exceptional heart  care, we have created designated Provider Care Teams.  These Care Teams include your primary Cardiologist (physician) and Advanced Practice Providers (APPs -  Physician Assistants and Nurse Practitioners) who all work together to provide you with the care you need, when you need it.  We recommend signing up for the patient portal called "MyChart".  Sign up information is provided on this After Visit Summary.  MyChart is used to connect with patients for Virtual Visits (Telemedicine).  Patients are able to view lab/test results, encounter notes, upcoming appointments, etc.  Non-urgent messages can be sent to your provider as well.   To learn more about what you can do with MyChart, go to [email or web address].    Your next appointment:   3 week(s)  The format for your next appointment:   In Person  Provider:   You may see Will Meredith Leeds, MD or one of the following Advanced Practice Providers on your designated Care Team:    Chanetta Marshall, NP  Tommye Standard, [name]  Legrand Como "[place]" [place], [place]    Thank you for choosing [facility]!!   Trinidad Curet, RN [phone]    Other Instructions          COVID TEST-- On 03/13/20 @ 12:45 pm  - You will go to [facility] hospital ([place]) for your Covid testing.   This is a drive thru test site.  There will be multiple testing areas.  Be sure to share with the first checkpoint that you are there for pre-procedure/surgery testing. This  will put you into the right (yellow) lane that leads to the PAT testing team. Stay in your car and the nurse team will come to your car to test you.  After you are tested please go home and self quarantine until the day of your procedure.     Cardioversion instructions: Please arrive at the [facility] main entrance of [facility] hospital on 03/15/20 at  9:00 am Do not eat or drink after midnight prior to procedure Do not take your diabetic medications the morning of procedure. Take  your remaining medications as normal the morning of your procedure with a sip of water.   You will need someone to drive you home at discharge               Electrical Cardioversion Electrical cardioversion is the delivery of a jolt of electricity to restore a normal rhythm to the heart. A rhythm that is too fast or is not regular keeps the heart from pumping well. In this procedure, sticky patches or metal paddles are placed on the chest to deliver electricity to the heart from a device. This procedure may be done in an emergency if:  There is low or no blood pressure as a result of the heart rhythm.  Normal rhythm must be restored as fast as possible to protect the brain and heart from further damage.  It may save a life. This may also be a scheduled procedure for irregular or fast heart rhythms that are not immediately life-threatening. Tell a health care provider about:  Any allergies you have.  All medicines you are taking, including vitamins, herbs, eye drops, creams, and over-the-counter medicines.  Any problems you or family members have had with anesthetic medicines.  Any blood disorders you have.  Any surgeries you have had.  Any medical conditions you have.  Whether you are pregnant or may be pregnant. What are the risks? Generally, this is a safe procedure. However, problems may occur, including:  Allergic reactions to medicines.  A blood clot that breaks free and travels to other parts of your body.  The possible return of an abnormal heart rhythm within hours or days after the procedure.  Your heart stopping (cardiac arrest). This is rare. What happens before the procedure? Medicines  Your health care provider may have you start taking: ? Blood-thinning medicines (anticoagulants) so your blood does not clot as easily. ? Medicines to help stabilize your heart rate and rhythm.  Ask your health care provider about: ? Changing or stopping your regular  medicines. This is especially important if you are taking diabetes medicines or blood thinners. ? Taking medicines such as aspirin and ibuprofen. These medicines can thin your blood. Do not take these medicines unless your health care provider tells you to take them. ? Taking over-the-counter medicines, vitamins, herbs, and supplements. General instructions  Follow instructions from your health care provider about eating or drinking restrictions.  Plan to have someone take you home from the hospital or clinic.  If you will be going home right after the procedure, plan to have someone with you for 24 hours.  Ask your health care provider what steps will be taken to help prevent infection. These may include washing your skin with a germ-killing soap. What happens during the procedure?   An IV will be inserted into one of your veins.  Sticky patches (electrodes) or metal paddles may be placed on your chest.  You will be given a medicine to help  you relax (sedative).  An electrical shock will be delivered. The procedure may vary among health care providers and hospitals. What can I expect after the procedure?  Your blood pressure, heart rate, breathing rate, and blood oxygen level will be monitored until you leave the hospital or clinic.  Your heart rhythm will be watched to make sure it does not change.  You may have some redness on the skin where the shocks were given. Follow these instructions at home:  Do not drive for 24 hours if you were given a sedative during your procedure.  Take over-the-counter and prescription medicines only as told by your health care provider.  Ask your health care provider how to check your pulse. Check it often.  Rest for 48 hours after the procedure or as told by your health care provider.  Avoid or limit your caffeine use as told by your health care provider.  Keep all follow-up visits as told by your health care provider. This is  important. Contact a health care provider if:  You feel like your heart is beating too quickly or your pulse is not regular.  You have a serious muscle cramp that does not go away. Get help right away if:  You have discomfort in your chest.  You are dizzy or you feel faint.  You have trouble breathing or you are short of breath.  Your speech is slurred.  You have trouble moving an arm or leg on one side of your body.  Your fingers or toes turn cold or blue. Summary  Electrical cardioversion is the delivery of a jolt of electricity to restore a normal rhythm to the heart.  This procedure may be done right away in an emergency or may be a scheduled procedure if the condition is not an emergency.  Generally, this is a safe procedure.  After the procedure, check your pulse often as told by your health care provider. This information is not intended to replace advice given to you by your health care provider. Make sure you discuss any questions you have with your health care provider. Document Revised: 05/31/2019 Document Reviewed: 05/31/2019 Elsevier Patient Education  [place].      Cardiac Ablation  Cardiac ablation is a procedure to stop some heart tissue from causing problems. The heart has many electrical connections. Sometimes these connections make the heart beat very fast or irregularly. Removing some problem areas can improve the heart rhythm or make it normal. What happens before the procedure?  Follow instructions from your doctor about what you cannot eat or drink.  Ask your doctor about: ? Changing or stopping your normal medicines. This is important if you take diabetes medicines or blood thinners. ? Taking medicines such as aspirin and ibuprofen. These medicines can thin your blood. Do not take these medicines before your procedure if your doctor tells you not to.  Plan to have someone take you home.  If you will be going home right after the  procedure, plan to have someone with you for 24 hours. What happens during the procedure?  To lower your risk of infection: ? Your health care team will wash or sanitize their hands. ? Your skin will be washed with soap. ? Hair may be removed from your neck or groin.  An IV tube will be put into one of your veins.  You will be given a medicine to help you relax (sedative).  Skin on your neck or groin will be numbed.  A cut (incision) will be made in your neck or groin.  A needle will be put through your cut and into a vein in your neck or groin.  A tube (catheter) will be put into the needle. The tube will be moved to your heart. X-rays (fluoroscopy) will be used to help guide the tube.  Small devices (electrodes) on the tip of the tube will send out electrical currents.  Dye may be put through the tube. This helps your surgeon see your heart.  Electrical energy will be used to scar (ablate) some heart tissue. Your surgeon may use: ? Heat (radiofrequency energy). ? Laser energy. ? Extreme cold (cryoablation).  The tube will be taken out.  Pressure will be held on your cut. This helps stop bleeding.  A bandage (dressing) will be put on your cut. The procedure may vary. What happens after the procedure?  You will be monitored until your medicines have worn off.  Your cut will be watched for bleeding. You will need to lie still for a few hours.  Do not drive for 24 hours or as long as your doctor tells you. Summary  Cardiac ablation is a procedure to stop some heart tissue from causing problems.  Electrical energy will be used to scar (ablate) some heart tissue. This information is not intended to replace advice given to you by your health care provider. Make sure you discuss any questions you have with your health care provider. Document Revised: 10/10/2017 Document Reviewed: 09/16/2016 Elsevier Patient Education  2020 [employer].

## 2020-03-06 NOTE — Progress Notes (Signed)
Electrophysiology Office Note   Date:  03/07/2020   ID:  Steven Maldonado, Steven Maldonado 24, 1942, MRN [id number]  PCP:  Patient, No Pcp Per  Cardiologist:  Angelena Form Primary Electrophysiologist:  Esly Selvage Meredith Leeds, MD    No chief complaint on file.    History of Present Illness: Steven Maldonado is a 79 y.o. male who is being seen today for the evaluation of complete AV block at the request of Darlina Guys. Presenting today for electrophysiology evaluation. 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  Today, denies symptoms of palpitations, chest pain, orthopnea, PND, lower extremity edema, claudication, dizziness, presyncope, syncope, bleeding, or neurologic sequela. The patient is tolerating medications without difficulties.  Unfortunately he is back in atrial fibrillation.  In terms her weakness, fatigue.  He also has dyspnea on exertion.  He does not have orthopnea or PND.  He noticed this 2 to 3 weeks ago.    Past Medical History:  Diagnosis Date  . Chronic diastolic CHF (congestive heart failure) ([place])   . CKD (chronic kidney disease), stage III   . Constipation   . Coronary artery disease    a. Cath February 2012 in [facility], occluded RCA with collaterals  . DM type 2 (diabetes mellitus, type 2) ([place])   . Essential hypertension   . Hyperlipidemia   . Neuropathy    feet  . Pacemaker    a. symptomatic brady after TAVR s/p MDT PPM by Dr. Curt Bears 12/04/17  . Persistent atrial fibrillation ([place] [address])   . PONV (postoperative nausea and vomiting)    after valve surgery  . PVD (peripheral vascular disease) ([place])    a. s/p R popliteal artery stenosis tx with drug-coated balloon 05/2014, followed by Dr. Fletcher Anon.  . 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 Severe aortic stenosis    a. 12/02/17: s/p TAVR  . Skin cancer   . Sleep apnea with use of continuous positive airway pressure (CPAP)    04-11-11 AHI was 32.9 and titrated to 15 cm H20, DME is [facility]  . Subclinical hypothyroidism    Past Surgical History:  Procedure Laterality Date  . ABDOMINAL ANGIOGRAM N/A 06/08/2014   Procedure: ABDOMINAL ANGIOGRAM;  Surgeon: Wellington Hampshire, MD;  [facility] CATH LAB;  Service: Cardiovascular;  Laterality: N/A;  . ABDOMINAL AORTOGRAM N/A 04/09/2018   Procedure: ABDOMINAL AORTOGRAM;  Surgeon: Conrad Adrian, MD;  Location: [place] CV LAB;  Service: Cardiovascular;  Laterality: N/A;  . AMPUTATION Left 04/17/2018   Procedure: LEFT FOOT 3RD RAY AMPUTATION;  Surgeon: Newt Minion, MD;  Location: [place];  Service: Orthopedics;  Laterality: Left;  . AMPUTATION Left 05/28/2018   Procedure: LEFT AMPUTATION BELOW KNEE;  Surgeon: Wylene Simmer, MD;  Location: [place];  Service: Orthopedics;  Laterality: Left;  . APPENDECTOMY  1965  . 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 CARDIAC CATHETERIZATION  12/2010  . CARDIOVERSION  07/2011  . CARDIOVERSION N/A 04/18/2014   Procedure: CARDIOVERSION;  Surgeon: Dorothy Spark, MD;  Location: [facility] ENDOSCOPY;  Service: Cardiovascular;  Laterality: N/A;  . CARDIOVERSION N/A 11/03/2015   Procedure: CARDIOVERSION;  Surgeon: Lelon Perla, MD;  Location: [facility] ENDOSCOPY;  Service: Cardiovascular;  Laterality: N/A;  . CARDIOVERSION N/A 05/08/2017   Procedure: CARDIOVERSION;  Surgeon:  Dorothy Spark, MD;  Location: [facility] ENDOSCOPY;  Service: Cardiovascular;  Laterality: N/A;  . CARDIOVERSION N/A 07/28/2017   Procedure: CARDIOVERSION;  Surgeon: Dorothy Spark, MD;  Location: [facility] ENDOSCOPY;  Service: Cardiovascular;  Laterality: N/A;  . CARDIOVERSION N/A 02/08/2020   Procedure: CARDIOVERSION;  Surgeon: Buford Dresser, MD;  Location: [place];  Service: Cardiovascular;  Laterality: N/A;  . LOWER EXTREMITY  ANGIOGRAM N/A 06/08/2014   Procedure: LOWER EXTREMITY ANGIOGRAM;  Surgeon: Wellington Hampshire, MD;  Location: [place] CATH LAB;  Service: Cardiovascular;  Laterality: N/A;  . LOWER EXTREMITY ANGIOGRAPHY Left 04/09/2018   Procedure: Lower Extremity Angiography;  Surgeon: Conrad Shepherd, MD;  Location: [place] CV LAB;  Service: Cardiovascular;  Laterality: Left;  . PACEMAKER IMPLANT N/A 12/04/2017   Procedure: PACEMAKER IMPLANT;  Surgeon: Constance Haw, MD;  Location: [place] CV LAB;  Service: Cardiovascular;  Laterality: N/A;  . PERIPHERAL VASCULAR BALLOON ANGIOPLASTY Left 04/09/2018   Procedure: PERIPHERAL VASCULAR BALLOON ANGIOPLASTY;  Surgeon: Conrad Kopperston, MD;  Location: [place] CV LAB;  Service: Cardiovascular;  Laterality: Left;  SFA  . POPLITEAL ARTERY ANGIOPLASTY Right 06/08/2014   Archie Endo 06/08/2014  . RIGHT/LEFT HEART CATH AND CORONARY ANGIOGRAPHY N/A 10/08/2017   Procedure: RIGHT/LEFT HEART CATH AND CORONARY ANGIOGRAPHY;  Surgeon: Burnell Blanks, MD;  Location: [place] CV LAB;  Service: Cardiovascular;  Laterality: N/A;  . SKIN CANCER EXCISION Bilateral    "have had them cut off back of neck X 2; off left upper arm; right wrist, near right shoulder blade" (06/08/2014)  . TEE WITHOUT CARDIOVERSION N/A 12/02/2017   Procedure: TRANSESOPHAGEAL ECHOCARDIOGRAM (TEE);  Surgeon: Burnell Blanks, MD;  Location: [place];  Service: Open Heart Surgery;  Laterality: N/A;  . TEMPORARY PACEMAKER N/A 12/04/2017   Procedure: TEMPORARY PACEMAKER;  Surgeon: Leonie Man, MD;  Location: [place] CV LAB;  Service: Cardiovascular;  Laterality: N/A;  . TRANSCATHETER AORTIC VALVE REPLACEMENT, TRANSFEMORAL N/A 12/02/2017   Procedure: TRANSCATHETER AORTIC VALVE REPLACEMENT, TRANSFEMORAL;  Surgeon: Burnell Blanks, MD;  Location: [place];  Service: Open Heart Surgery;  Laterality: N/A;  using Edwards Sapien 3 Transcatheter Heart Valve size 12mm     Current Outpatient  Medications  Medication Sig Dispense Refill  . amiodarone (PACERONE) 200 MG tablet Take 1 tablet (200 mg total) by mouth daily. Please make overdue appt with Dr. Angelena Form before anymore refills. 2nd attempt 15 tablet 0  . atorvastatin (LIPITOR) 80 MG tablet Take 1 tablet (80 mg total) by mouth at bedtime. 90 tablet 3  . BD PEN NEEDLE NANO 2ND GEN 32G X 4 MM MISC USE 4 (FOUR) TIMES DAILY. 200 each 2  . Blood Glucose Monitoring Suppl (FREESTYLE LITE) DEVI 1 each by Does not apply route 2 (two) times daily. 1 each 0  . Choline Fenofibrate (FENOFIBRIC ACID) 135 MG CPDR Take 1 capsule by mouth daily. 90 capsule 0  . dabigatran (PRADAXA) 150 MG CAPS capsule Take 1 capsule (150 mg total) by mouth every 12 (twelve) hours. 180 capsule 3  . docusate sodium (COLACE) 100 MG capsule Take 100 mg by mouth 2 (two) times daily.    . Evolocumab 140 MG/ML SOSY Inject 140 mg into the skin every 14 (fourteen) days. 2.1 mL 2  . ezetimibe (ZETIA) 10 MG tablet TAKE 1 TABLET BY MOUTH EVERY DAY 90 tablet 1  . FREESTYLE LITE test strip 1 EACH BY OTHER ROUTE 4 (FOUR) TIMES DAILY - BEFORE MEALS AND AT BEDTIME. 450 strip 3  .  furosemide (LASIX) 20 MG tablet Take 1 tablet (20 mg total) by mouth daily. 90 tablet 3  . glipiZIDE (GLUCOTROL XL) 10 MG 24 hr tablet TAKE 1 TABLET (10 MG TOTAL) BY MOUTH DAILY WITH BREAKFAST. 90 tablet 1  . Insulin Degludec (TRESIBA FLEXTOUCH) 200 UNIT/ML SOPN Inject 26 Units into the skin at bedtime. 15 mL 1  . insulin lispro (HUMALOG KWIKPEN) 100 UNIT/ML KwikPen Inject 4-6 units under the skin up to three times daily if eating sweets or high carb foods. (Patient taking differently: Inject 5 Units into the skin 3 (three) times daily. ) 30 mL 1  . levothyroxine (SYNTHROID) 175 MCG tablet TAKE 1 TABLET BY MOUTH EVERY DAY (Patient taking differently: Take 175 mcg by mouth daily before breakfast. ) 90 tablet 1  . metoprolol tartrate (LOPRESSOR) 50 MG tablet TAKE ONE AND ONE-HALF TABLET BY MOUTH TWICE A DAY  (Patient taking differently: Take 75 mg by mouth 2 (two) times daily. ) 270 tablet 0  . mirabegron ER (MYRBETRIQ) 25 MG TB24 tablet Take 1 tablet (25 mg total) by mouth daily. 90 tablet 0  . mupirocin ointment (BACTROBAN) 2 % If open sores develop, apply TID 30 g 0  . pentoxifylline (TRENTAL) 400 MG CR tablet Take 1 tablet (400 mg total) by mouth 3 (three) times daily with meals. 90 tablet 3  . silver sulfADIAZINE (SILVADENE) 1 % cream Apply 1 application topically daily. 50 g 0  . Vitamin D, Ergocalciferol, (DRISDOL) 1.25 MG (50000 UNIT) CAPS capsule Take 1 capsule (50,000 Units total) by mouth every 7 (seven) days. 12 capsule 1  . amiodarone (PACERONE) 200 MG tablet Increase Amiodarone to 400 mg once daily for a month, then reduce back to 200 mg once a day 30 tablet 0   No current facility-administered medications for this visit.    Allergies:   Patient has no known allergies.   Social History:  The patient  reports that he quit smoking about 47 years ago. His smoking use included cigarettes. He has a 28.00 pack-year smoking history. He has never used smokeless tobacco. He reports that he does not drink alcohol or use drugs.   Family History:  The patient's family history includes Diabetes in his brother, daughter, father, mother, sister, and another family member; Heart Problems in his daughter; Heart attack in his father and mother; Heart failure in his father; Hypertension in his brother, father, mother, and sister; Stroke in his brother.    ROS:  Please see the history of present illness.   Otherwise, review of systems is positive for none.   All other systems are reviewed and negative.   PHYSICAL EXAM: VS:  BP 134/60   Pulse 75   Ht 5\' 11"  (1.803 m)   Wt 245 lb (111.1 kg)   BMI 34.17 kg/m  , BMI Body mass index is 34.17 kg/m. GEN: Well nourished, well developed, in no acute distress  HEENT: normal  Neck: no JVD, carotid bruits, or masses Cardiac: RRR; no murmurs, rubs, or  gallops,no edema  Respiratory:  clear to auscultation bilaterally, normal work of breathing GI: soft, nontender, nondistended, + BS MS: no deformity or atrophy  Skin: warm and dry, device site well healed Neuro:  Strength and sensation are intact Psych: euthymic mood, full affect  EKG:  EKG is ordered today. Personal review of the ekg ordered shows AF, ventricular paced  Personal review of the device interrogation today. Results in [place]: 01/24/2020: TSH 8.390 02/02/2020:  ALT 13 03/06/2020: BUN 30; Creatinine, Ser 1.90; Hemoglobin 12.7; Platelets 166; Potassium 4.4; Sodium 138    Lipid Panel     Component Value Date/Time   CHOL 161 01/24/2020 1045   TRIG 113 01/24/2020 1045   HDL 34 (L) 01/24/2020 1045   CHOLHDL 4.7 01/24/2020 1045   CHOLHDL 6.1 (H) 05/31/2019 1246   VLDL 43.8 (H) 08/18/2018 1500   LDLCALC 106 (H) 01/24/2020 1045   LDLCALC 142 (H) 05/31/2019 1246   LDLDIRECT 123.0 12/03/2019 1136     Wt Readings from Last 3 Encounters:  03/06/20 245 lb (111.1 kg)  02/08/20 260 lb (117.9 kg)  02/02/20 261 lb (118.4 kg)      Other studies Reviewed: Additional studies/ records that were reviewed today include: TTE 12/24/17  Review of the above records today demonstrates:  - Left ventricle: Inferobasal hypokinesis The cavity size was   normal. Wall thickness was increased in a pattern of severe LVH.   Systolic function was normal. The estimated ejection fraction was   in the range of 55% to 60%. - Aortic valve: Post TAVR with no significant peri valvualr   regurgitation gradients have increased since 12/03/17. - Mitral valve: Severely calcified annulus. Moderately thickened,   moderately calcified leaflets . There was mild regurgitation. - Left atrium: The atrium was moderately dilated. - Atrial septum: No defect or patent foramen ovale was identified.   ASSESSMENT AND PLAN:  1.  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  Device functioning appropriately.  No changes at this time.    2. Paroxysmal atrial fibrillation: On amiodarone and Pradaxa.  CHA2DS2-VASc of 4.  He is gone back into atrial fibrillation.  Due to that, we Jakara Blatter plan for repeat cardioversion with an increased dose of amiodarone.  If he goes back out of rhythm, ablation may be an option, though he would be at a little bit higher risk.  3. Hypertension: Currently well controlled  4. Coronary artery disease: No current chest pain.  Continue with current management.  5. Hyperlipidemia: Continue statin  6. OSA: CPAP compliance encouraged  Current medicines are reviewed at length with the patient today.   The patient does not have concerns regarding his medicines.  The following changes were made today: Increase amiodarone  Labs/ tests ordered today include:  Orders Placed This Encounter  Procedures  . Basic metabolic panel  . CBC  . CUP PACEART [place]  . EKG 12-Lead     Disposition:   FU with Mylin Gignac 3 months  Signed, Saphyra Hutt Meredith Leeds, MD  03/07/2020 7:13 AM     [facility] [address] [place] [address] [phone] (office) [phone] (fax)

## 2020-03-07 DIAGNOSIS — L570 Actinic keratosis: Secondary | ICD-10-CM | POA: Diagnosis not present

## 2020-03-07 DIAGNOSIS — X32XXXA Exposure to sunlight, initial encounter: Secondary | ICD-10-CM | POA: Diagnosis not present

## 2020-03-07 DIAGNOSIS — L821 Other seborrheic keratosis: Secondary | ICD-10-CM | POA: Diagnosis not present

## 2020-03-07 DIAGNOSIS — D2261 Melanocytic nevi of right upper limb, including shoulder: Secondary | ICD-10-CM | POA: Diagnosis not present

## 2020-03-07 DIAGNOSIS — D485 Neoplasm of uncertain behavior of skin: Secondary | ICD-10-CM | POA: Diagnosis not present

## 2020-03-07 DIAGNOSIS — C44319 Basal cell carcinoma of skin of other parts of face: Secondary | ICD-10-CM | POA: Diagnosis not present

## 2020-03-07 DIAGNOSIS — D2262 Melanocytic nevi of left upper limb, including shoulder: Secondary | ICD-10-CM | POA: Diagnosis not present

## 2020-03-07 DIAGNOSIS — D2271 Melanocytic nevi of right lower limb, including hip: Secondary | ICD-10-CM | POA: Diagnosis not present

## 2020-03-07 DIAGNOSIS — Z85828 Personal history of other malignant neoplasm of skin: Secondary | ICD-10-CM | POA: Diagnosis not present

## 2020-03-07 LAB — CBC
Hematocrit: 36.7 % — ABNORMAL LOW (ref 37.5–51.0)
Hemoglobin: 12.7 g/dL — ABNORMAL LOW (ref 13.0–17.7)
MCH: 32.8 pg (ref 26.6–33.0)
MCHC: 34.6 g/dL (ref 31.5–35.7)
MCV: 95 fL (ref 79–97)
Platelets: 166 10*3/uL (ref 150–450)
RBC: 3.87 x10E6/uL — ABNORMAL LOW (ref 4.14–5.80)
RDW: 13.1 % (ref 11.6–15.4)
WBC: 5.5 10*3/uL (ref 3.4–10.8)

## 2020-03-07 LAB — BASIC METABOLIC PANEL
BUN/Creatinine Ratio: 16 (ref 10–24)
BUN: 30 mg/dL — ABNORMAL HIGH (ref 8–27)
CO2: 22 mmol/L (ref 20–29)
Calcium: 9.7 mg/dL (ref 8.6–10.2)
Chloride: 103 mmol/L (ref 96–106)
Creatinine, Ser: 1.9 mg/dL — ABNORMAL HIGH (ref 0.76–1.27)
GFR calc Af Amer: 39 mL/min/{1.73_m2} — ABNORMAL LOW (ref >59)
GFR calc non Af Amer: 33 mL/min/{1.73_m2} — ABNORMAL LOW (ref >59)
Glucose: 227 mg/dL — ABNORMAL HIGH (ref 65–99)
Potassium: 4.4 mmol/L (ref 3.5–5.2)
Sodium: 138 mmol/L (ref 134–144)

## 2020-03-09 ENCOUNTER — Other Ambulatory Visit: Payer: Self-pay

## 2020-03-09 ENCOUNTER — Encounter: Payer: Self-pay | Admitting: Endocrinology

## 2020-03-09 ENCOUNTER — Ambulatory Visit (INDEPENDENT_AMBULATORY_CARE_PROVIDER_SITE_OTHER): Payer: Medicare Other | Admitting: Endocrinology

## 2020-03-09 VITALS — BP 140/82 | HR 82 | Ht 71.0 in | Wt 250.6 lb

## 2020-03-09 DIAGNOSIS — I2510 Atherosclerotic heart disease of native coronary artery without angina pectoris: Secondary | ICD-10-CM | POA: Diagnosis not present

## 2020-03-09 DIAGNOSIS — E782 Mixed hyperlipidemia: Secondary | ICD-10-CM | POA: Diagnosis not present

## 2020-03-09 DIAGNOSIS — E1165 Type 2 diabetes mellitus with hyperglycemia: Secondary | ICD-10-CM | POA: Diagnosis not present

## 2020-03-09 DIAGNOSIS — E039 Hypothyroidism, unspecified: Secondary | ICD-10-CM

## 2020-03-09 DIAGNOSIS — Z794 Long term (current) use of insulin: Secondary | ICD-10-CM

## 2020-03-09 LAB — POCT GLYCOSYLATED HEMOGLOBIN (HGB A1C): Hemoglobin A1C: 7.3 % — AB (ref 4.0–5.6)

## 2020-03-09 LAB — GLUCOSE, POCT (MANUAL RESULT ENTRY): POC Glucose: 95 mg/dl (ref 70–99)

## 2020-03-09 MED ORDER — OZEMPIC (0.25 OR 0.5 MG/DOSE) 2 MG/1.5ML ~~LOC~~ SOPN: 0.5000 mg | PEN_INJECTOR | SUBCUTANEOUS | 2 refills | Status: DC

## 2020-03-09 NOTE — Patient Instructions (Addendum)
Start OZEMPIC injections by dialing 0.25 mg on the pen as shown once weekly on the same day of the week.   You may inject in the sides of the stomach, outer thigh or arm as indicated in the brochure given. If you have any difficulties using the pen see the video at [email or web address].[email or web address].za  You will feel fullness of the stomach with starting the medication and should try to keep the portions at meals small.  You may experience nausea in the first few days which usually gets better over time    After 4 weeks increase the dose to 0.5 mg weekly  If you have any questions or persistent side effects please call the office   You may also talk to a nurse educator with [employer] at [phone] Useful website: [email or web address]   Tresiba 22 and if am sugar is <70 then cut to 20 units  Check sugar 4x daily  Low alert 60

## 2020-03-09 NOTE — Progress Notes (Signed)
Patient ID: Steven Maldonado, male   DOB: [birth year], 79 y.o.   MRN: [id number]          Reason for Appointment: Follow-up for Type 2 Diabetes    History of Present Illness:          Date of diagnosis of type 2 diabetes mellitus:  1999      Background history:   He had been treated with metformin and glipizide for several years Metformin was stopped about 4 years ago because of renal dysfunction Presumably because of poor control he was started on insulin around the year 2009 He had been mostly on Lantus which was subsequently changed to [employer] and he thinks that Lantus worked better Also has been on Humalog 3-4 times a day for some time  Recent history:   Most recent A1c is 7.3, previously 8%  INSULIN regimen is:  TRESIBA 25 at bedtime, Humalog, 5 units before dinnertime  Non-insulin hypoglycemic drugs the patient is taking are: None  Current management, blood sugar patterns and problems identified:  He has been using the freestyle libre system only about 56% of the time with monitoring being sporadic and mostly in the morning and bedtime  Even though he has switched to the freestyle libre version 2 his blood sugars are still reading falsely low; his lab glucose in the office today was 95 compared to 65 on his liver  With this he is getting falsely low readings overnight and usually is not symptomatic with readings in the 60 range  His blood sugars appear to be higher postprandially and his usually not taking any insulin coverage for his lunch meal  With this his blood sugar in the lab was 227 in the afternoon  Also periodically will have readings over 200 after dinner with only taking 5 units of Humalog  Also not clear if his blood sugars are high after breakfast        Side effects from medications have been: None     Meal times are:  Breakfast is at 8 AM, dinnertime 5-6 PM            Current CGM = libre version 2  CGM use % of time  56  2-week average/SD  156,  GV 38  Time in range     60   %  % Time Above 180  29  % Time above 250  6  % Time Below 70  5     PRE-MEAL Fasting Lunch Dinner Bedtime Overall  Glucose range:       Averages:  109  85  181   156   POST-MEAL PC Breakfast PC Lunch PC Dinner  Glucose range:     Averages:  113?   165  202    Previous data:   CGM use % of time  64  Average and SD  142, GV 36  Time in range  63      %  % Time Above 180  27  % Time above 250   % Time Below target  9     PRE-MEAL Fasting Lunch Dinner Bedtime Overall  Glucose range:       Mean/median:  85 ?  173   189  142   POST-MEAL PC Breakfast PC Lunch PC Dinner  Glucose range:     Mean/median:  115   176    Dietician visit, most recent: 9/19  Weight history:  Wt Readings from Last 3 Encounters:  03/09/20 250 lb 9.6 oz (113.7 kg)  03/06/20 245 lb (111.1 kg)  02/08/20 260 lb (117.9 kg)    Glycemic control:   Lab Results  Component Value Date   HGBA1C 7.3 (A) 03/09/2020   HGBA1C 8.0 (H) 12/03/2019   HGBA1C 7.6 (H) 06/04/2019   Lab Results  Component Value Date   MICROALBUR 3.0 (H) 12/03/2019   LDLCALC 106 (H) 01/24/2020   CREATININE 1.90 (H) 03/06/2020   Lab Results  Component Value Date   MICRALBCREAT 1.7 12/03/2019    Lab Results  Component Value Date   FRUCTOSAMINE 268 06/26/2018    Office Visit on 03/09/2020  Component Date Value Ref Range Status  . POC Glucose 03/09/2020 95  70 - 99 mg/dl Final  . Hemoglobin A1C 03/09/2020 7.3* 4.0 - 5.6 % Final  Office Visit on 03/06/2020  Component Date Value Ref Range Status  . Glucose 03/06/2020 227* 65 - 99 mg/dL Final  . BUN 03/06/2020 30* 8 - 27 mg/dL Final  . Creatinine, Ser 03/06/2020 1.90* 0.76 - 1.27 mg/dL Final  . GFR calc non Af Amer 03/06/2020 33* >59 mL/min/1.73 Final  . GFR calc Af Amer 03/06/2020 39* >59 mL/min/1.73 Final   Comment: **[facility] currently reports eGFR in compliance with the current**   recommendations of the [employer].  [facility] will   update reporting as new guidelines are published from the NKF-ASN   Task force.   . BUN/Creatinine Ratio 03/06/2020 16  10 - 24 Final  . Sodium 03/06/2020 138  134 - 144 mmol/L Final  . Potassium 03/06/2020 4.4  3.5 - 5.2 mmol/L Final  . Chloride 03/06/2020 103  96 - 106 mmol/L Final  . CO2 03/06/2020 22  20 - 29 mmol/L Final  . Calcium 03/06/2020 9.7  8.6 - 10.2 mg/dL Final  . WBC 03/06/2020 5.5  3.4 - 10.8 x10E3/uL Final  . RBC 03/06/2020 3.87* 4.14 - 5.80 x10E6/uL Final  . Hemoglobin 03/06/2020 12.7* 13.0 - 17.7 g/dL Final  . Hematocrit 03/06/2020 36.7* 37.5 - 51.0 % Final  . MCV 03/06/2020 95  79 - 97 fL Final  . MCH 03/06/2020 32.8  26.6 - 33.0 pg Final  . MCHC 03/06/2020 34.6  31.5 - 35.7 g/dL Final  . RDW 03/06/2020 13.1  11.6 - 15.4 % Final  . Platelets 03/06/2020 166  150 - 450 x10E3/uL Final  . Date Time Interrogation Session 03/06/2020 [id number]   Final  . Pulse Generator Manufacturer 03/06/2020 MERM   Final  . Pulse Gen Model 03/06/2020 [id number] XT DR MRI   Final  . Pulse Gen Serial Number 03/06/2020 [id number]   Final  . Clinic Name 03/06/2020 [place]   Final  . Implantable Pulse Generator Type 03/06/2020 Implantable Pulse Generator   Final  . Implantable Pulse Generator Implan* 03/06/2020 [id number]   Final  . Implantable Lead Manufacturer 03/06/2020 MERM   Final  . Implantable Lead Model 03/06/2020 5076 CapSureFix Novus MRI SureScan   Final  . Implantable Lead Serial Number 03/06/2020 [id number]   Final  . Implantable Lead Implant Date 03/06/2020 [id number]   Final  . Implantable Lead Location Detail 1 03/06/2020 APPENDAGE   Final  . Implantable Lead Location 03/06/2020 [id number]   Final  . Implantable Lead Manufacturer 03/06/2020 MERM   Final  . Implantable Lead Model 03/06/2020 5076 CapSureFix Novus MRI SureScan   Final  . Implantable Lead Serial Number 03/06/2020 [id number]   Final  . Implantable Lead Implant Date 03/06/2020 [id number]  Final  . Implantable Lead Location Detail 1 03/06/2020 SEPTUM   Final  . Implantable Lead Location 03/06/2020 [id number]   Final  . Lead Channel Setting Sensing Sensi* 03/06/2020 4  mV Final  . Lead Channel Setting Pacing Amplit* 03/06/2020 2  V Final  . Lead Channel Setting Pacing Pulse * 03/06/2020 0.4  ms Final  . Lead Channel Setting Pacing Amplit* 03/06/2020 2.5  V Final  . Lead Channel Impedance Value 03/06/2020 361  ohm Final  . Lead Channel Impedance Value 03/06/2020 323  ohm Final  . Lead Channel Sensing Intrinsic Amp* 03/06/2020 0.25  mV Final  . Lead Channel Pacing Threshold Ampl* 03/06/2020 0.625  V Final  . Lead Channel Pacing Threshold Puls* 03/06/2020 0.4  ms Final  . Lead Channel Impedance Value 03/06/2020 456  ohm Final  . Lead Channel Impedance Value 03/06/2020 361  ohm Final  . Lead Channel Sensing Intrinsic Amp* 03/06/2020 18.375  mV Final  . Lead Channel Pacing Threshold Ampl* 03/06/2020 0.625  V Final  . Lead Channel Pacing Threshold Puls* 03/06/2020 0.4  ms Final  . Battery Status 03/06/2020 OK   Final  . Battery Remaining Longevity 03/06/2020 105  mo Final  . Battery Voltage 03/06/2020 2.93  V Final  . Brady Statistic RA Percent Paced 03/06/2020 24.55  % Final  . Brady Statistic RV Percent Paced 03/06/2020 99.78  % Final  . Brady Statistic AP VP Percent 03/06/2020 36.23  % Final  . Brady Statistic AS VP Percent 03/06/2020 63.47  % Final  . Brady Statistic AP VS Percent 03/06/2020 0.01  % Final  . Brady Statistic AS VS Percent 03/06/2020 0.18  % Final    Allergies as of 03/09/2020   No Known Allergies     Medication List       Accurate as of March 09, 2020 11:59 PM. If you have any questions, ask your nurse or doctor.        amiodarone 200 MG tablet Commonly known as: PACERONE Take 1 tablet (200 mg total) by mouth daily. Please make overdue appt with Dr. Angelena Form before anymore refills. 2nd attempt   amiodarone 200 MG tablet Commonly known as: PACERONE  Increase Amiodarone to 400 mg once daily for a month, then reduce back to 200 mg once a day   atorvastatin 80 MG tablet Commonly known as: LIPITOR Take 1 tablet (80 mg total) by mouth at bedtime.   BD Pen Needle Nano 2nd Gen 32G X 4 MM Misc Generic drug: Insulin Pen Needle USE 4 (FOUR) TIMES DAILY.   dabigatran 150 MG Caps capsule Commonly known as: Pradaxa Take 1 capsule (150 mg total) by mouth every 12 (twelve) hours.   docusate sodium 100 MG capsule Commonly known as: COLACE Take 100 mg by mouth 2 (two) times daily.   Evolocumab 140 MG/ML Sosy Inject 140 mg into the skin every 14 (fourteen) days.   ezetimibe 10 MG tablet Commonly known as: ZETIA TAKE 1 TABLET BY MOUTH EVERY DAY   Fenofibric Acid 135 MG Cpdr Take 1 capsule by mouth daily.   FreeStyle Lite Devi 1 each by Does not apply route 2 (two) times daily.   FREESTYLE LITE test strip Generic drug: glucose blood 1 EACH BY OTHER ROUTE 4 (FOUR) TIMES DAILY - BEFORE MEALS AND AT BEDTIME.   furosemide 20 MG tablet Commonly known as: LASIX Take 1 tablet (20 mg total) by mouth daily.   glipiZIDE 10 MG 24 hr tablet Commonly known as: GLUCOTROL  XL TAKE 1 TABLET (10 MG TOTAL) BY MOUTH DAILY WITH BREAKFAST.   insulin lispro 100 UNIT/ML KwikPen Commonly known as: HumaLOG KwikPen Inject 4-6 units under the skin up to three times daily if eating sweets or high carb foods. What changed:   how much to take  how to take this  when to take this  additional instructions   levothyroxine 175 MCG tablet Commonly known as: SYNTHROID TAKE 1 TABLET BY MOUTH EVERY DAY What changed: when to take this   metoprolol tartrate 50 MG tablet Commonly known as: LOPRESSOR TAKE ONE AND ONE-HALF TABLET BY MOUTH TWICE A DAY What changed: See the new instructions.   mirabegron ER 25 MG Tb24 tablet Commonly known as: Myrbetriq Take 1 tablet (25 mg total) by mouth daily.   mupirocin ointment 2 % Commonly known as: BACTROBAN If  open sores develop, apply TID   Ozempic (0.25 or 0.5 MG/DOSE) 2 MG/1.5ML Sopn Generic drug: Semaglutide(0.25 or 0.5MG/DOS) Inject 0.5 mg into the skin once a week. Started by: Elayne Snare, MD   pentoxifylline 400 MG CR tablet Commonly known as: TRENTAL Take 1 tablet (400 mg total) by mouth 3 (three) times daily with meals.   silver sulfADIAZINE 1 % cream Commonly known as: Silvadene Apply 1 application topically daily.   [name] FlexTouch 200 UNIT/ML FlexTouch Pen Generic drug: insulin degludec Inject 26 Units into the skin at bedtime.   Vitamin D (Ergocalciferol) 1.25 MG (50000 UNIT) Caps capsule Commonly known as: DRISDOL Take 1 capsule (50,000 Units total) by mouth every 7 (seven) days.       Allergies: No Known Allergies  Past Medical History:  Diagnosis Date  . Chronic diastolic CHF (congestive heart failure) ([place])   . CKD (chronic kidney disease), stage III   . Constipation   . Coronary artery disease    a. Cath February 2012 in [facility], occluded RCA with collaterals  . DM type 2 (diabetes mellitus, type 2) ([place])   . Essential hypertension   . Hyperlipidemia   . Neuropathy    feet  . Pacemaker    a. symptomatic brady after TAVR s/p MDT PPM by Dr. Curt Bears 12/04/17  . Persistent atrial fibrillation ([place])   . PONV (postoperative nausea and vomiting)    after valve surgery  . PVD (peripheral vascular disease) ([place])    a. s/p R popliteal artery stenosis tx with drug-coated balloon 05/2014, followed by Dr. Fletcher Anon.  . 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 Severe aortic stenosis    a. 12/02/17: s/p TAVR  . Skin cancer   . Sleep apnea with use of continuous positive airway pressure (CPAP)    04-11-11 AHI was 32.9 and titrated to 15 cm H20, DME is [facility]  . Subclinical hypothyroidism     Past Surgical History:  Procedure Laterality Date  .  ABDOMINAL ANGIOGRAM N/A 06/08/2014   Procedure: ABDOMINAL ANGIOGRAM;  Surgeon: Wellington Hampshire, MD;  Location: [facility] CATH LAB;  Service: Cardiovascular;  Laterality: N/A;  . ABDOMINAL AORTOGRAM N/A 04/09/2018   Procedure: ABDOMINAL AORTOGRAM;  Surgeon: Conrad Crossett, MD;  Location: [place] CV LAB;  Service: Cardiovascular;  Laterality: N/A;  . AMPUTATION Left 04/17/2018   Procedure: LEFT FOOT 3RD RAY AMPUTATION;  Surgeon: Newt Minion, MD;  Location: [place];  Service: Orthopedics;  Laterality: Left;  . AMPUTATION Left 05/28/2018   Procedure: LEFT AMPUTATION  BELOW KNEE;  Surgeon: Wylene Simmer, MD;  Location: [place];  Service: Orthopedics;  Laterality: Left;  . APPENDECTOMY  1965  . 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 CARDIAC CATHETERIZATION  12/2010  . CARDIOVERSION  07/2011  . CARDIOVERSION N/A 04/18/2014   Procedure: CARDIOVERSION;  Surgeon: Dorothy Spark, MD;  Location: [place];  Service: Cardiovascular;  Laterality: N/A;  . CARDIOVERSION N/A 11/03/2015   Procedure: CARDIOVERSION;  Surgeon: Lelon Perla, MD;  Location: [name] ENDOSCOPY;  Service: Cardiovascular;  Laterality: N/A;  . CARDIOVERSION N/A 05/08/2017   Procedure: CARDIOVERSION;  Surgeon: Dorothy Spark, MD;  Location: [facility] ENDOSCOPY;  Service: Cardiovascular;  Laterality: N/A;  . CARDIOVERSION N/A 07/28/2017   Procedure: CARDIOVERSION;  Surgeon: Dorothy Spark, MD;  Location: [facility] ENDOSCOPY;  Service: Cardiovascular;  Laterality: N/A;  . CARDIOVERSION N/A 02/08/2020   Procedure: CARDIOVERSION;  Surgeon: Buford Dresser, MD;  Location: [facility] ENDOSCOPY;  Service: Cardiovascular;  Laterality: N/A;  . LOWER EXTREMITY ANGIOGRAM N/A 06/08/2014   Procedure: LOWER EXTREMITY ANGIOGRAM;  Surgeon: Wellington Hampshire, MD;  Location: [facility] CATH LAB;  Service: Cardiovascular;  Laterality: N/A;  . LOWER EXTREMITY ANGIOGRAPHY Left 04/09/2018   Procedure: Lower Extremity Angiography;  Surgeon: Conrad Mount Jackson, MD;  Location: [place]  CV LAB;  Service: Cardiovascular;  Laterality: Left;  . PACEMAKER IMPLANT N/A 12/04/2017   Procedure: PACEMAKER IMPLANT;  Surgeon: Constance Haw, MD;  Location: [place] CV LAB;  Service: Cardiovascular;  Laterality: N/A;  . PERIPHERAL VASCULAR BALLOON ANGIOPLASTY Left 04/09/2018   Procedure: PERIPHERAL VASCULAR BALLOON ANGIOPLASTY;  Surgeon: Conrad Shedd, MD;  Location: [place] CV LAB;  Service: Cardiovascular;  Laterality: Left;  SFA  . POPLITEAL ARTERY ANGIOPLASTY Right 06/08/2014   Archie Endo 06/08/2014  . RIGHT/LEFT HEART CATH AND CORONARY ANGIOGRAPHY N/A 10/08/2017   Procedure: RIGHT/LEFT HEART CATH AND CORONARY ANGIOGRAPHY;  Surgeon: Burnell Blanks, MD;  Location: [place] CV LAB;  Service: Cardiovascular;  Laterality: N/A;  . SKIN CANCER EXCISION Bilateral    "have had them cut off back of neck X 2; off left upper arm; right wrist, near right shoulder blade" (06/08/2014)  . TEE WITHOUT CARDIOVERSION N/A 12/02/2017   Procedure: TRANSESOPHAGEAL ECHOCARDIOGRAM (TEE);  Surgeon: Burnell Blanks, MD;  Location: [place];  Service: Open Heart Surgery;  Laterality: N/A;  . TEMPORARY PACEMAKER N/A 12/04/2017   Procedure: TEMPORARY PACEMAKER;  Surgeon: Leonie Man, MD;  Location: [place] CV LAB;  Service: Cardiovascular;  Laterality: N/A;  . TRANSCATHETER AORTIC VALVE REPLACEMENT, TRANSFEMORAL N/A 12/02/2017   Procedure: TRANSCATHETER AORTIC VALVE REPLACEMENT, TRANSFEMORAL;  Surgeon: Burnell Blanks, MD;  Location: [place];  Service: Open Heart Surgery;  Laterality: N/A;  using Edwards Sapien 3 Transcatheter Heart Valve size 63m    Family History  Problem Relation Age of Onset  . Diabetes Mother   . Heart attack Mother   . Hypertension Mother   . Heart attack Father   . Heart failure Father   . Hypertension Father   . Diabetes Father   . Diabetes Sister   . Diabetes Brother   . Diabetes Other   . Diabetes Daughter        TYPE ll  . Heart Problems  Daughter   . Hypertension Sister   . Hypertension Brother   . Stroke Brother     Social History:  reports that he quit smoking about 47 years ago. His smoking use included cigarettes. He has a 28.00 pack-year smoking history.  He has never used smokeless tobacco. He reports that he does not drink alcohol or use drugs.   Review of Systems   Lipid history: He is taking 80 mg atorvastatin and his LDL also without adequate control, high-dose with history of CAD He is not sure if he is taking his medication regularly again LDL is somewhat better with adding Zetia, recently seen by PCP    Lab Results  Component Value Date   CHOL 161 01/24/2020   CHOL 219 (H) 05/31/2019   CHOL 131 08/18/2018   Lab Results  Component Value Date   HDL 34 (L) 01/24/2020   HDL 36 (L) 05/31/2019   HDL 30.30 (L) 08/18/2018   Lab Results  Component Value Date   LDLCALC 106 (H) 01/24/2020   LDLCALC 142 (H) 05/31/2019   LDLCALC 93 10/13/2017   Lab Results  Component Value Date   TRIG 113 01/24/2020   TRIG 264 (H) 05/31/2019   TRIG 219.0 (H) 08/18/2018   Lab Results  Component Value Date   CHOLHDL 4.7 01/24/2020   CHOLHDL 6.1 (H) 05/31/2019   CHOLHDL 4 08/18/2018   Lab Results  Component Value Date   LDLDIRECT 123.0 12/03/2019   LDLDIRECT 77.0 08/18/2018            Hypertension: Has been controlled with only antihypertensive being metoprolol, not on ACE inhibitor  BP Readings from Last 3 Encounters:  03/09/20 140/82  03/06/20 134/60  02/08/20 (!) 117/44    Most recent eye exam was in 12/18 ?  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 History of symptomatic neuropathy  He has chronic kidney disease of unclear etiology, is followed by nephrologist periodically   Lab Results  Component Value Date   CREATININE 1.90 (H) 03/06/2020   CREATININE 1.80 (H) 02/02/2020   CREATININE 1.96 (H) 01/24/2020       Physical Examination:  BP 140/82 (BP Location: Left Arm, Patient Position: Sitting, Cuff Size: Normal)   Pulse 82   Ht '5\' 11"'  (1.803 m)   Wt 250 lb 9.6 oz (113.7 kg)   SpO2 97%   BMI 34.95 kg/m          ASSESSMENT:  Diabetes type 2, insulin requiring with moderate obesity  See history of present illness for detailed discussion of current diabetes management, blood sugar patterns and problems identified  A1c is somewhat better at 7.3  Hyperglycemia is again occurring after meals Currently with his freestyle libre difficult to get an accurate idea of his blood sugar readings since he appears to have falsely low readings even with the version 2 Blood sugars are low normal overnight and fairly often higher after lunch and dinner He has difficulty trying to take multiple injections of insulin Because of his marked obesity is also a good candidate for a GLP-1 drug which he has not tried before  CKD: Stable, to follow-up with nephrologist regularly   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: His TSH is higher with 175 mcg He  is also taking a higher dose of amiodarone now  Hyperlipidemia: LDL is followed by PCP and cardiologist Need to make sure he is taking his medication regularly and is also a good candidate for adding either Nexletol or Repatha    PLAN:    Continue using freestyle libre but periodically check blood sugars with fingerstick  He likely needs to compare the readings regularly and add 15 to 30 mg to his freestyle reading to get better accuracy  He will need to check blood sugars  at least every 8 hours because of the data not being retained unless he is checking more often Explained to the patient what GLP-1 drugs are, the sites of actions and the body, reduction of hunger sensation and improved insulin secretion.  Discussed the benefit of weight loss with these medications. Explained possible side effects of OZEMPIC, most commonly nausea that usually improves over time.  Also explained safety information associated with the medication Demonstrated the medication injection device and injection technique to the patient.  To start the injections with the 0.25 mg dosage weekly for the first 4 injections and then go up to 0.5 mg weekly if no continued nausea  Patient brochure on Ozempic given  He will need to take at least 6 to 8 units of Humalog at suppertime unless eating a low carbohydrate meal  If his blood sugars are not improved after lunch we will need to also take similar doses for high carbohydrate meals  Tresiba 22 units daily for now and may need to reduce it further if morning sugars are still below 70 after starting Ozempic  May be able to taper down his Humalog if the Ozempic is effective in 2 to 3 weeks  We will confirm that he is taking his Synthroid regularly and if so will increase the dose to 200 mcg daily  Patient Instructions  Start OZEMPIC injections by dialing 0.25 mg on the pen as shown once weekly on the same day of the week.   You may inject in the sides of the stomach, outer thigh or arm as indicated in the brochure given. If you have any difficulties using the pen see the video at [email or web address].[email or web address]  You will feel fullness of the stomach with starting the medication and should try to keep the portions at meals small.  You may experience nausea in the first few days which usually gets better over time    After 4 weeks increase the dose to 0.5 mg weekly  If you have any questions or persistent side effects please call the office   You may  also talk to a nurse educator with [employer] at [phone] Useful website: [email or web address]   Tresiba 22 and if am sugar is <70 then cut to 20 units  Check sugar 4x daily  Low alert [place] 03/10/2020, 11:46 AM   Note: This office note was prepared with Dragon voice recognition system technology. Any transcriptional errors that result from this process are unintentional.

## 2020-03-10 ENCOUNTER — Telehealth: Payer: Self-pay | Admitting: General Practice

## 2020-03-10 ENCOUNTER — Telehealth: Payer: Self-pay | Admitting: Endocrinology

## 2020-03-10 MED ORDER — MIRABEGRON ER 25 MG PO TB24: 25.0000 mg | ORAL_TABLET | Freq: Every day | ORAL | 0 refills | Status: DC

## 2020-03-10 NOTE — Addendum Note (Signed)
Addended by: Mickel Crow on: 03/10/2020 09:26 AM   Modules accepted: Orders

## 2020-03-10 NOTE — Telephone Encounter (Signed)
Patient walked into office about his Myrbetriq refill. We sent [facility] order to [facility] [address]. He declined this order as it should have gone to [facility]. Patient states that the price is significantly less if processed this way. Can we please reprocess this order?

## 2020-03-10 NOTE — Telephone Encounter (Signed)
Please let him know that I have reviewed his thyroid levels from March and it was indicating inadequate dose of levothyroxine.  If he has been taking his levothyroxine 175 mcg daily consistently will need to change it to 200 mcg.  Also please confirm that he is taking ezetimibe along with his atorvastatin regularly

## 2020-03-10 NOTE — Telephone Encounter (Signed)
Refill sent to the correct pharmacy. AS, CMA

## 2020-03-10 NOTE — Telephone Encounter (Signed)
Called pt and left voicemail requesting a call back. °

## 2020-03-13 ENCOUNTER — Other Ambulatory Visit (HOSPITAL_COMMUNITY)
Admission: RE | Admit: 2020-03-13 | Discharge: 2020-03-13 | Disposition: A | Discharge status: Home / Self Care | Payer: Medicare Other | Source: Ambulatory Visit | Attending: Cardiovascular Disease | Admitting: Cardiovascular Disease

## 2020-03-13 ENCOUNTER — Other Ambulatory Visit: Payer: Self-pay | Admitting: Cardiovascular Disease

## 2020-03-13 ENCOUNTER — Other Ambulatory Visit: Payer: Self-pay

## 2020-03-13 DIAGNOSIS — Z20822 Contact with and (suspected) exposure to covid-19: Secondary | ICD-10-CM | POA: Diagnosis not present

## 2020-03-13 DIAGNOSIS — Z01812 Encounter for preprocedural laboratory examination: Secondary | ICD-10-CM | POA: Diagnosis not present

## 2020-03-13 LAB — SARS CORONAVIRUS 2 (TAT 6-24 HRS): SARS Coronavirus 2: NEGATIVE

## 2020-03-13 MED ORDER — LEVOTHYROXINE SODIUM 200 MCG PO TABS: 200.0000 ug | ORAL_TABLET | Freq: Every day | ORAL | 1 refills | Status: DC

## 2020-03-13 NOTE — Telephone Encounter (Signed)
Called pt and spoke with pt's wife and she stated that pt is taking both medication regularly. New Rx for levothyroxine 245mcg was sent and pt's wife was informed of this. She stated that she would pick up this afternoon.

## 2020-03-15 ENCOUNTER — Encounter (HOSPITAL_COMMUNITY)
Admission: RE | Disposition: A | Discharge status: Home / Self Care | Payer: Medicare Other | Source: Ambulatory Visit | Attending: Cardiovascular Disease

## 2020-03-15 ENCOUNTER — Ambulatory Visit (HOSPITAL_COMMUNITY)
Admission: RE | Admit: 2020-03-15 | Discharge: 2020-03-15 | Disposition: A | Discharge status: Home / Self Care | Payer: Medicare Other | Source: Ambulatory Visit | Attending: Cardiovascular Disease | Admitting: Cardiovascular Disease

## 2020-03-15 ENCOUNTER — Other Ambulatory Visit: Payer: Self-pay

## 2020-03-15 ENCOUNTER — Ambulatory Visit (HOSPITAL_COMMUNITY): Payer: Medicare Other | Admitting: Certified Registered Nurse Anesthetist

## 2020-03-15 DIAGNOSIS — I4819 Other persistent atrial fibrillation: Secondary | ICD-10-CM

## 2020-03-15 DIAGNOSIS — I5032 Chronic diastolic (congestive) heart failure: Secondary | ICD-10-CM | POA: Diagnosis not present

## 2020-03-15 DIAGNOSIS — Z794 Long term (current) use of insulin: Secondary | ICD-10-CM | POA: Diagnosis not present

## 2020-03-15 DIAGNOSIS — Z950 Presence of cardiac pacemaker: Secondary | ICD-10-CM | POA: Insufficient documentation

## 2020-03-15 DIAGNOSIS — Z952 Presence of prosthetic heart valve: Secondary | ICD-10-CM | POA: Insufficient documentation

## 2020-03-15 DIAGNOSIS — I509 Heart failure, unspecified: Secondary | ICD-10-CM | POA: Diagnosis not present

## 2020-03-15 DIAGNOSIS — E039 Hypothyroidism, unspecified: Secondary | ICD-10-CM | POA: Insufficient documentation

## 2020-03-15 DIAGNOSIS — Z89512 Acquired absence of left leg below knee: Secondary | ICD-10-CM | POA: Diagnosis not present

## 2020-03-15 DIAGNOSIS — G4733 Obstructive sleep apnea (adult) (pediatric): Secondary | ICD-10-CM | POA: Diagnosis not present

## 2020-03-15 DIAGNOSIS — I480 Paroxysmal atrial fibrillation: Secondary | ICD-10-CM | POA: Insufficient documentation

## 2020-03-15 DIAGNOSIS — Z7901 Long term (current) use of anticoagulants: Secondary | ICD-10-CM | POA: Diagnosis not present

## 2020-03-15 DIAGNOSIS — E1140 Type 2 diabetes mellitus with diabetic neuropathy, unspecified: Secondary | ICD-10-CM | POA: Diagnosis not present

## 2020-03-15 DIAGNOSIS — Z8249 Family history of ischemic heart disease and other diseases of the circulatory system: Secondary | ICD-10-CM | POA: Diagnosis not present

## 2020-03-15 DIAGNOSIS — N183 Chronic kidney disease, stage 3 unspecified: Secondary | ICD-10-CM | POA: Diagnosis not present

## 2020-03-15 DIAGNOSIS — I442 Atrioventricular block, complete: Secondary | ICD-10-CM | POA: Insufficient documentation

## 2020-03-15 DIAGNOSIS — N1832 Chronic kidney disease, stage 3b: Secondary | ICD-10-CM | POA: Diagnosis not present

## 2020-03-15 DIAGNOSIS — E1122 Type 2 diabetes mellitus with diabetic chronic kidney disease: Secondary | ICD-10-CM | POA: Insufficient documentation

## 2020-03-15 DIAGNOSIS — Z87891 Personal history of nicotine dependence: Secondary | ICD-10-CM | POA: Insufficient documentation

## 2020-03-15 DIAGNOSIS — E1151 Type 2 diabetes mellitus with diabetic peripheral angiopathy without gangrene: Secondary | ICD-10-CM | POA: Insufficient documentation

## 2020-03-15 DIAGNOSIS — I2510 Atherosclerotic heart disease of native coronary artery without angina pectoris: Secondary | ICD-10-CM | POA: Insufficient documentation

## 2020-03-15 DIAGNOSIS — E785 Hyperlipidemia, unspecified: Secondary | ICD-10-CM | POA: Diagnosis not present

## 2020-03-15 DIAGNOSIS — Z79890 Hormone replacement therapy (postmenopausal): Secondary | ICD-10-CM | POA: Diagnosis not present

## 2020-03-15 DIAGNOSIS — I130 Hypertensive heart and chronic kidney disease with heart failure and stage 1 through stage 4 chronic kidney disease, or unspecified chronic kidney disease: Secondary | ICD-10-CM | POA: Diagnosis not present

## 2020-03-15 DIAGNOSIS — Z79899 Other long term (current) drug therapy: Secondary | ICD-10-CM | POA: Insufficient documentation

## 2020-03-15 DIAGNOSIS — Z833 Family history of diabetes mellitus: Secondary | ICD-10-CM | POA: Diagnosis not present

## 2020-03-15 HISTORY — PX: CARDIOVERSION: SHX1299

## 2020-03-15 LAB — GLUCOSE, CAPILLARY: Glucose-Capillary: 133 mg/dL — ABNORMAL HIGH (ref 70–99)

## 2020-03-15 SURGERY — CARDIOVERSION
Anesthesia: General

## 2020-03-15 MED ORDER — SODIUM CHLORIDE 0.9 % IV SOLN: INTRAVENOUS | Status: DC

## 2020-03-15 MED ORDER — LIDOCAINE 2% (20 MG/ML) 5 ML SYRINGE
INTRAMUSCULAR | Status: DC | PRN
  Administered 2020-03-15: 40 mg via INTRAVENOUS

## 2020-03-15 MED ORDER — PROPOFOL 10 MG/ML IV BOLUS
INTRAVENOUS | Status: DC | PRN
  Administered 2020-03-15: 60 mg via INTRAVENOUS

## 2020-03-15 NOTE — Anesthesia Postprocedure Evaluation (Signed)
Anesthesia Post Note  Patient: Steven Maldonado  Procedure(s) Performed: CARDIOVERSION (N/A )     Patient location during evaluation: Endoscopy Anesthesia Type: General Level of consciousness: awake Pain management: pain level controlled Vital Signs Assessment: post-procedure vital signs reviewed and stable Respiratory status: spontaneous breathing Cardiovascular status: stable Postop Assessment: no backache Anesthetic complications: no    Last Vitals:  Vitals:   03/15/20 1116 03/15/20 1120  BP: (!) 124/48 (!) 132/59  Pulse: (!) 59 (!) 59  Resp: 18 15  Temp:    SpO2: 96% 97%    Last Pain:  Vitals:   03/15/20 1058  TempSrc: Temporal  PainSc:                  [name]

## 2020-03-15 NOTE — Discharge Instructions (Signed)
Electrical Cardioversion   What can I expect after the procedure?  Your blood pressure, heart rate, breathing rate, and blood oxygen level will be monitored until you leave the hospital or clinic.  Your heart rhythm will be watched to make sure it does not change.  You may have some redness on the skin where the shocks were given. Follow these instructions at home:  Do not drive for 24 hours if you were given a sedative during your procedure.  Take over-the-counter and prescription medicines only as told by your health care provider.  Ask your health care provider how to check your pulse. Check it often.  Rest for 48 hours after the procedure or as told by your health care provider.  Avoid or limit your caffeine use as told by your health care provider.  Keep all follow-up visits as told by your health care provider. This is important. Contact a health care provider if:  You feel like your heart is beating too quickly or your pulse is not regular.  You have a serious muscle cramp that does not go away. Get help right away if:  You have discomfort in your chest.  You are dizzy or you feel faint.  You have trouble breathing or you are short of breath.  Your speech is slurred.  You have trouble moving an arm or leg on one side of your body.  Your fingers or toes turn cold or blue. Summary  Electrical cardioversion is the delivery of a jolt of electricity to restore a normal rhythm to the heart.  This procedure may be done right away in an emergency or may be a scheduled procedure if the condition is not an emergency.  Generally, this is a safe procedure.  After the procedure, check your pulse often as told by your health care provider. This information is not intended to replace advice given to you by your health care provider. Make sure you discuss any questions you have with your health care provider. Document Revised: 05/31/2019 Document Reviewed:  05/31/2019 Elsevier Patient Education  2020 Elsevier Inc.  

## 2020-03-15 NOTE — Interval H&P Note (Signed)
History and Physical Interval Note:  03/15/2020 9:30 AM  Steven Maldonado  has presented today for surgery, with the diagnosis of AFIB.  The various methods of treatment have been discussed with the patient and family. After consideration of risks, benefits and other options for treatment, the patient has consented to  Procedure(s): CARDIOVERSION (N/A) as a surgical intervention.  The patient's history has been reviewed, patient examined, no change in status, stable for surgery.  I have reviewed the patient's chart and labs.  Questions were answered to the patient's satisfaction.     [name]

## 2020-03-15 NOTE — Anesthesia Preprocedure Evaluation (Signed)
Anesthesia Evaluation  Patient identified by MRN, date of birth, ID band Patient awake    History of Anesthesia Complications (+) PONV  Airway Mallampati: II  TM Distance: >3 FB     Dental   Pulmonary sleep apnea , former smoker,    breath sounds clear to auscultation       Cardiovascular hypertension, + CAD, + Peripheral Vascular Disease and +CHF  + pacemaker  Rhythm:Regular Rate:Normal     Neuro/Psych    GI/Hepatic Neg liver ROS,   Endo/Other  diabetesHypothyroidism   Renal/GU Renal disease     Musculoskeletal   Abdominal   Peds  Hematology   Anesthesia Other Findings   Reproductive/Obstetrics                             Anesthesia Physical Anesthesia Plan  ASA: III  Anesthesia Plan: General   Post-op Pain Management:    Induction: Intravenous  PONV Risk Score and Plan: 3 and Propofol infusion  Airway Management Planned: Nasal Cannula and Simple Face Mask  Additional Equipment:   Intra-op Plan:   Post-operative Plan:   Informed Consent: I have reviewed the patients History and Physical, chart, labs and discussed the procedure including the risks, benefits and alternatives for the proposed anesthesia with the patient or authorized representative who has indicated his/her understanding and acceptance.     Dental advisory given  Plan Discussed with: CRNA  Anesthesia Plan Comments:         Anesthesia Quick Evaluation

## 2020-03-15 NOTE — Anesthesia Procedure Notes (Signed)
Procedure Name: General with mask airway Date/Time: 03/15/2020 10:45 AM Performed by: Janene Harvey, CRNA Pre-anesthesia Checklist: Patient identified, Emergency Drugs available, Suction available and Patient being monitored Patient Re-evaluated:Patient Re-evaluated prior to induction Oxygen Delivery Method: Ambu bag Placement Confirmation: positive ETCO2 Dental Injury: Teeth and Oropharynx as per pre-operative assessment

## 2020-03-15 NOTE — CV Procedure (Signed)
    Cardioversion Note  Steven Maldonado [id number] 10-Jan-1941  Procedure: DC Cardioversion Indications:  Atrial fib   Procedure Details Consent: Obtained Time Out: Verified patient identification, verified procedure, site/side was marked, verified correct patient position, special equipment/implants available, Radiology Safety Procedures followed,  medications/allergies/relevent history reviewed, required imaging and test results available.  Performed  The patient has been on adequate anticoagulation.  The patient received IV Lidocaine 40 mg folllowed by Propofol 60 mg IV  for sedation.  Synchronous cardioversion was performed at  200  joules.  The cardioversion was successful.   Verified by Medtronic rep, Steven Maldonado.      Complications: No apparent complications Patient did tolerate procedure well.   Steven Maldonado, Steven Bonito., MD, [facility] 03/15/2020, 10:52 AM

## 2020-03-15 NOTE — Transfer of Care (Signed)
Immediate Anesthesia Transfer of Care Note  Patient: Steven Maldonado  Procedure(s) Performed: CARDIOVERSION (N/A )  Patient Location: Endoscopy Unit  Anesthesia Type:General  Level of Consciousness: drowsy  Airway & Oxygen Therapy: Patient Spontanous Breathing and Patient connected to nasal cannula oxygen  Post-op Assessment: Report given to RN and Post -op Vital signs reviewed and stable  Post vital signs: Reviewed  Last Vitals:  Vitals Value Taken Time  BP    Temp    Pulse    Resp    SpO2      Last Pain:  Vitals:   03/15/20 0931  TempSrc: Oral  PainSc: 0-No pain         Complications: No apparent anesthesia complications

## 2020-03-20 ENCOUNTER — Telehealth: Payer: Self-pay | Admitting: General Practice

## 2020-03-20 DIAGNOSIS — L03031 Cellulitis of right toe: Secondary | ICD-10-CM

## 2020-03-20 MED ORDER — MUPIROCIN 2 % EX OINT: TOPICAL_OINTMENT | CUTANEOUS | 0 refills | Status: DC

## 2020-03-20 NOTE — Addendum Note (Signed)
Addended by: Mickel Crow on: 03/20/2020 02:35 PM   Modules accepted: Orders

## 2020-03-20 NOTE — Telephone Encounter (Signed)
Rx sent to pharmacy. AS, CMA 

## 2020-03-20 NOTE — Telephone Encounter (Signed)
Patient states that he has reoccurring spots on his legs that Dr. Jenetta Downer has treated in the past with Bactroban. He is currently out of this topical med and is requesting a refill be sent to [name] if possible.

## 2020-03-27 ENCOUNTER — Ambulatory Visit (INDEPENDENT_AMBULATORY_CARE_PROVIDER_SITE_OTHER): Payer: Medicare Other | Admitting: *Deleted

## 2020-03-27 DIAGNOSIS — I442 Atrioventricular block, complete: Secondary | ICD-10-CM

## 2020-03-27 LAB — CUP PACEART REMOTE DEVICE CHECK
Battery Remaining Longevity: 107 mo
Battery Voltage: 2.93 V
Brady Statistic AP VP Percent: 50.99 %
Brady Statistic AP VS Percent: 0 %
Brady Statistic AS VP Percent: 48.97 %
Brady Statistic AS VS Percent: 0.04 %
Brady Statistic RA Percent Paced: 50.77 %
Brady Statistic RV Percent Paced: 99.96 %
Date Time Interrogation Session: 20210517004514
Implantable Lead Implant Date: 20190124
Implantable Lead Implant Date: 20190124
Implantable Lead Location: 753859
Implantable Lead Location: 753860
Implantable Lead Model: 5076
Implantable Lead Model: 5076
Implantable Pulse Generator Implant Date: 20190124
Lead Channel Impedance Value: 361 Ohm
Lead Channel Impedance Value: 380 Ohm
Lead Channel Impedance Value: 399 Ohm
Lead Channel Impedance Value: 494 Ohm
Lead Channel Pacing Threshold Amplitude: 0.625 V
Lead Channel Pacing Threshold Amplitude: 0.625 V
Lead Channel Pacing Threshold Pulse Width: 0.4 ms
Lead Channel Pacing Threshold Pulse Width: 0.4 ms
Lead Channel Sensing Intrinsic Amplitude: 10.75 mV
Lead Channel Sensing Intrinsic Amplitude: 18.375 mV
Lead Channel Sensing Intrinsic Amplitude: 2.25 mV
Lead Channel Sensing Intrinsic Amplitude: 2.25 mV
Lead Channel Setting Pacing Amplitude: 2 V
Lead Channel Setting Pacing Amplitude: 2.5 V
Lead Channel Setting Pacing Pulse Width: 0.4 ms
Lead Channel Setting Sensing Sensitivity: 4 mV

## 2020-03-27 NOTE — Progress Notes (Signed)
Electrophysiology Office Note Date: 03/28/2020  ID:  Steven Maldonado, Steven Maldonado [birth year], MRN [id number]  PCP: System, Pcp Not In Primary Cardiologist: No primary care provider on file. Electrophysiologist: Will Meredith Leeds, MD   CC: Pacemaker follow-up  Steven Maldonado is a 79 y.o. male seen today for Will Meredith Leeds, MD for routine electrophysiology followup.  Since last being seen in our clinic the patient reports doing well since his DCCV. He denies chest pain, palpitations, dyspnea, PND, orthopnea, nausea, vomiting, dizziness, syncope, edema, weight gain, or early satiety.  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 Chronic diastolic CHF (congestive heart failure) ([place])   . CKD (chronic kidney disease), stage III   . Constipation   . Coronary artery disease    a. Cath February 2012 in [place] [facility], occluded RCA with collaterals  . DM type 2 (diabetes mellitus, type 2) ([place])   . Essential hypertension   . Hyperlipidemia   . Neuropathy    feet  . Pacemaker    a. symptomatic brady after TAVR s/p MDT PPM by Dr. Curt Bears 12/04/17  . Persistent atrial fibrillation ([place])   . PONV (postoperative nausea and vomiting)    after valve surgery  . PVD (peripheral vascular disease) ([place])    a. s/p R popliteal artery stenosis tx with drug-coated balloon 05/2014, followed by Dr. Fletcher Anon.  . 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 Severe aortic stenosis    a. 12/02/17: s/p TAVR  . Skin cancer   . Sleep apnea with use of continuous positive airway pressure (CPAP)    04-11-11 AHI was 32.9 and titrated to 15 cm H20, DME is [facility]  . Subclinical hypothyroidism    Past Surgical History:  Procedure Laterality Date  . ABDOMINAL ANGIOGRAM N/A 06/08/2014   Procedure: ABDOMINAL ANGIOGRAM;  Surgeon: Wellington Hampshire, MD;  Location: [facility] CATH LAB;  Service: Cardiovascular;  Laterality: N/A;  . ABDOMINAL AORTOGRAM N/A 04/09/2018   Procedure: ABDOMINAL AORTOGRAM;  Surgeon: Conrad Sedan, MD;  Location: [place] CV LAB;  Service: Cardiovascular;  Laterality: N/A;  . AMPUTATION Left 04/17/2018   Procedure: LEFT FOOT 3RD RAY AMPUTATION;  Surgeon: Newt Minion, MD;  Location: [place];  Service: Orthopedics;  Laterality: Left;  . AMPUTATION Left 05/28/2018   Procedure: LEFT AMPUTATION BELOW KNEE;  Surgeon: Wylene Simmer, MD;  Location: [place];  Service: Orthopedics;  Laterality: Left;  . APPENDECTOMY  1965  . 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 CARDIAC CATHETERIZATION  12/2010  . CARDIOVERSION  07/2011  . CARDIOVERSION N/A 04/18/2014   Procedure: CARDIOVERSION;  Surgeon: Dorothy Spark, MD;  Location: [place];  Service: Cardiovascular;  Laterality: N/A;  . CARDIOVERSION N/A 11/03/2015   Procedure: CARDIOVERSION;  Surgeon: Lelon Perla, MD;  Location: [facility] ENDOSCOPY;  Service: Cardiovascular;  Laterality: N/A;  . CARDIOVERSION N/A 05/08/2017   Procedure: CARDIOVERSION;  Surgeon: Dorothy Spark, MD;  Location: [facility] ENDOSCOPY;  Service: Cardiovascular;  Laterality: N/A;  . CARDIOVERSION N/A 07/28/2017   Procedure: CARDIOVERSION;  Surgeon: Dorothy Spark, MD;  Location: [facility] ENDOSCOPY;  Service: Cardiovascular;  Laterality: N/A;  . CARDIOVERSION N/A 02/08/2020   Procedure: CARDIOVERSION;  Surgeon: Buford Dresser, MD;  Location: [facility] ENDOSCOPY;  Service: Cardiovascular;  Laterality: N/A;  . CARDIOVERSION N/A 03/15/2020   Procedure:  CARDIOVERSION;  Surgeon: Thayer Headings, MD;  Location: [place];  Service: Cardiovascular;  Laterality: N/A;  . LOWER EXTREMITY ANGIOGRAM N/A 06/08/2014   Procedure: LOWER EXTREMITY ANGIOGRAM;  Surgeon: Wellington Hampshire, MD;  Location: [place] CATH LAB;  Service: Cardiovascular;  Laterality: N/A;  . LOWER EXTREMITY ANGIOGRAPHY Left 04/09/2018   Procedure: Lower  Extremity Angiography;  Surgeon: Conrad Berea, MD;  Location: [place] CV LAB;  Service: Cardiovascular;  Laterality: Left;  . PACEMAKER IMPLANT N/A 12/04/2017   Procedure: PACEMAKER IMPLANT;  Surgeon: Constance Haw, MD;  Location: [place] CV LAB;  Service: Cardiovascular;  Laterality: N/A;  . PERIPHERAL VASCULAR BALLOON ANGIOPLASTY Left 04/09/2018   Procedure: PERIPHERAL VASCULAR BALLOON ANGIOPLASTY;  Surgeon: Conrad Marne, MD;  Location: [place] CV LAB;  Service: Cardiovascular;  Laterality: Left;  SFA  . POPLITEAL ARTERY ANGIOPLASTY Right 06/08/2014   Archie Endo 06/08/2014  . RIGHT/LEFT HEART CATH AND CORONARY ANGIOGRAPHY N/A 10/08/2017   Procedure: RIGHT/LEFT HEART CATH AND CORONARY ANGIOGRAPHY;  Surgeon: Burnell Blanks, MD;  Location: [place] CV LAB;  Service: Cardiovascular;  Laterality: N/A;  . SKIN CANCER EXCISION Bilateral    "have had them cut off back of neck X 2; off left upper arm; right wrist, near right shoulder blade" (06/08/2014)  . TEE WITHOUT CARDIOVERSION N/A 12/02/2017   Procedure: TRANSESOPHAGEAL ECHOCARDIOGRAM (TEE);  Surgeon: Burnell Blanks, MD;  Location: [place];  Service: Open Heart Surgery;  Laterality: N/A;  . TEMPORARY PACEMAKER N/A 12/04/2017   Procedure: TEMPORARY PACEMAKER;  Surgeon: Leonie Man, MD;  Location: [place] CV LAB;  Service: Cardiovascular;  Laterality: N/A;  . TRANSCATHETER AORTIC VALVE REPLACEMENT, TRANSFEMORAL N/A 12/02/2017   Procedure: TRANSCATHETER AORTIC VALVE REPLACEMENT, TRANSFEMORAL;  Surgeon: Burnell Blanks, MD;  Location: [place];  Service: Open Heart Surgery;  Laterality: N/A;  using Edwards Sapien 3 Transcatheter Heart Valve size 18mm    Current Outpatient Medications  Medication Sig Dispense Refill  . amiodarone (PACERONE) 200 MG tablet Take 1 tablet (200 mg total) by mouth daily. Please make overdue appt with Dr. Angelena Form before anymore refills. 2nd attempt 15 tablet 0  . atorvastatin  (LIPITOR) 80 MG tablet Take 1 tablet (80 mg total) by mouth at bedtime. 90 tablet 3  . BD PEN NEEDLE NANO 2ND GEN 32G X 4 MM MISC USE 4 (FOUR) TIMES DAILY. 200 each 2  . Blood Glucose Monitoring Suppl (FREESTYLE LITE) DEVI 1 each by Does not apply route 2 (two) times daily. 1 each 0  . Choline Fenofibrate (FENOFIBRIC ACID) 135 MG CPDR Take 1 capsule by mouth daily. 90 capsule 0  . dabigatran (PRADAXA) 150 MG CAPS capsule Take 1 capsule (150 mg total) by mouth every 12 (twelve) hours. 180 capsule 3  . docusate sodium (COLACE) 100 MG capsule Take 100 mg by mouth 2 (two) times daily.    . Evolocumab 140 MG/ML SOSY Inject 140 mg into the skin every 14 (fourteen) days. 2.1 mL 2  . ezetimibe (ZETIA) 10 MG tablet TAKE 1 TABLET BY MOUTH EVERY DAY 90 tablet 1  . FREESTYLE LITE test strip 1 EACH BY OTHER ROUTE 4 (FOUR) TIMES DAILY - BEFORE MEALS AND AT BEDTIME. 450 strip 3  . furosemide (LASIX) 20 MG tablet Take 1 tablet (20 mg total) by mouth daily. 90 tablet 3  . glipiZIDE (GLUCOTROL XL) 10 MG 24 hr tablet TAKE 1 TABLET (10 MG TOTAL) BY MOUTH DAILY WITH BREAKFAST. 90 tablet 1  . Insulin Degludec (  TRESIBA FLEXTOUCH) 200 UNIT/ML SOPN Inject 26 Units into the skin at bedtime. 15 mL 1  . insulin lispro (HUMALOG KWIKPEN) 100 UNIT/ML KwikPen Inject 4-6 units under the skin up to three times daily if eating sweets or high carb foods. 30 mL 1  . levothyroxine (SYNTHROID) 200 MCG tablet Take 1 tablet (200 mcg total) by mouth daily before breakfast. 30 tablet 1  . metoprolol tartrate (LOPRESSOR) 50 MG tablet TAKE ONE AND ONE-HALF TABLET BY MOUTH TWICE A DAY 270 tablet 0  . mirabegron ER (MYRBETRIQ) 25 MG TB24 tablet Take 25 mg by mouth 4 (four) times a week.    . mupirocin ointment (BACTROBAN) 2 % If open sores develop, apply TID 30 g 0  . pentoxifylline (TRENTAL) 400 MG CR tablet Take 1 tablet (400 mg total) by mouth 3 (three) times daily with meals. 90 tablet 3  . Semaglutide,0.25 or 0.5MG /DOS, (OZEMPIC, 0.25 OR  0.5 MG/DOSE,) 2 MG/1.5ML SOPN Inject 0.5 mg into the skin once a week. 1 pen 2  . silver sulfADIAZINE (SILVADENE) 1 % cream Apply 1 application topically daily. 50 g 0  . Vitamin D, Ergocalciferol, (DRISDOL) 1.25 MG (50000 UNIT) CAPS capsule Take 1 capsule (50,000 Units total) by mouth every 7 (seven) days. 12 capsule 1   No current facility-administered medications for this visit.    Allergies:   Patient has no known allergies.   Social History: Social History   Socioeconomic History  . Marital status: Married    Spouse name: [name]  . Number of children: 1  . Years of education: 70  . Highest education level: Not on file  Occupational History  . Occupation: retired    Fish farm manager: [employer]  Tobacco Use  . Smoking status: Former Smoker    Packs/day: 2.00    Years: 14.00    Pack years: 28.00    Types: Cigarettes    Quit date: 11/11/1972    Years since quitting: 47.4  . Smokeless tobacco: Never Used  Substance and Sexual Activity  . Alcohol use: No    Comment: quit in 1984  . Drug use: No  . Sexual activity: Not Currently  Other Topics Concern  . 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   Patient has one child and his wife has one child.   Patient is retired.   Patient has a high school education.   Patient is right-handed.   Patient drinks very little caffeine.   Social Determinants of Health   Financial Resource Strain:   . Difficulty of Paying Living Expenses:   Food Insecurity: No Food Insecurity  . Worried About Charity fundraiser in the Last Year: Never true  . Ran Out of Food in the Last Year: Never true  Transportation Needs: No Transportation Needs  . Lack of Transportation (Medical): No  . Lack of Transportation (Non-Medical): No  Physical Activity:   . Days of Exercise per Week:   . Minutes of Exercise per Session:   Stress:   . Feeling of Stress :   Social Connections:   .  Frequency of Communication with Friends and Family:   . Frequency of Social Gatherings with Friends and Family:   . Attends Religious Services:   . Active Member of Clubs or Organizations:   . Attends Archivist Meetings:   [name] Kitchen Marital Status:   Intimate Partner Violence:   . Fear of Current or  Ex-Partner:   . Emotionally Abused:   [name] Kitchen Physically Abused:   . Sexually Abused:     Family History: Family History  Problem Relation Age of Onset  . Diabetes Mother   . Heart attack Mother   . Hypertension Mother   . Heart attack Father   . Heart failure Father   . Hypertension Father   . Diabetes Father   . Diabetes Sister   . Diabetes Brother   . Diabetes Other   . Diabetes Daughter        TYPE ll  . Heart Problems Daughter   . Hypertension Sister   . Hypertension Brother   . Stroke Brother      Review of Systems: All other systems reviewed and are otherwise negative except as noted above.  Physical Exam: Vitals:   03/28/20 1127  BP: 118/64  Pulse: 63  SpO2: 97%  Weight: 239 lb 1.9 oz (108.5 kg)  Height: 5\' 11"  (1.803 m)     GEN- The patient is well appearing, alert and oriented x 3 today.   HEENT: normocephalic, atraumatic; sclera clear, conjunctiva pink; hearing intact; oropharynx clear; neck supple  Lungs- Clear to ausculation bilaterally, normal work of breathing.  No wheezes, rales, rhonchi Heart- Regular rate and rhythm, no murmurs, rubs or gallops  GI- soft, non-tender, non-distended, bowel sounds present  Extremities- no clubbing, cyanosis, or edema  MS- no significant deformity or atrophy Skin- warm and dry, no rash or lesion; PPM pocket well healed Psych- euthymic mood, full affect Neuro- strength and sensation are intact  PPM Interrogation- reviewed in detail today,  See PACEART report  EKG:  EKG is not ordered today.  Recent Labs: 01/24/2020: TSH 8.390 02/02/2020: ALT 13 03/06/2020: BUN 30; Creatinine, Ser 1.90; Hemoglobin 12.7; Platelets  166; Potassium 4.4; Sodium 138   Wt Readings from Last 3 Encounters:  03/28/20 239 lb 1.9 oz (108.5 kg)  03/09/20 250 lb 9.6 oz (113.7 kg)  03/06/20 245 lb (111.1 kg)     Other studies Reviewed: Additional studies/ records that were reviewed today include: Previous EP office notes, Previous remote checks, Most recent labwork.   Assessment and Plan:  1. CHB s/p Medtronic PPM  Normal PPM function See Pace Art report No changes today  2. Persistent AF Continue amiodarone 400 mg daily for total of 1 month, then down to 200 mg daily.  NSR today by device interrogation.  Continue Pradaxa for CHA2DS2VASC of at least 4   Per Dr. Curt Bears, if fails amio/DCCV again, would be ablation candidate with at least moderate risk.  3. HTN Continue current regimen  4. CAD Denies ishemic symptoms  Current medicines are reviewed at length with the patient today.   The patient does not have concerns regarding his medicines.  The following changes were made today:  none  Labs/ tests ordered today include:  Orders Placed This Encounter  Procedures  . CUP PACEART INCLINIC DEVICE CHECK   Disposition:   Follow up with Dr. Curt Bears in 3 Months    Signed, Annamaria Helling  03/28/2020 1:42 PM  [address] [place] [address] [phone] (office) [phone] (fax)

## 2020-03-28 ENCOUNTER — Other Ambulatory Visit: Payer: Self-pay

## 2020-03-28 ENCOUNTER — Encounter: Payer: Self-pay | Admitting: Student

## 2020-03-28 ENCOUNTER — Ambulatory Visit (INDEPENDENT_AMBULATORY_CARE_PROVIDER_SITE_OTHER): Payer: Medicare Other | Admitting: Student

## 2020-03-28 VITALS — BP 118/64 | HR 63 | Ht 71.0 in | Wt 239.1 lb

## 2020-03-28 DIAGNOSIS — I10 Essential (primary) hypertension: Secondary | ICD-10-CM

## 2020-03-28 DIAGNOSIS — I2510 Atherosclerotic heart disease of native coronary artery without angina pectoris: Secondary | ICD-10-CM | POA: Diagnosis not present

## 2020-03-28 DIAGNOSIS — I442 Atrioventricular block, complete: Secondary | ICD-10-CM

## 2020-03-28 DIAGNOSIS — I4819 Other persistent atrial fibrillation: Secondary | ICD-10-CM | POA: Diagnosis not present

## 2020-03-28 LAB — CUP PACEART INCLINIC DEVICE CHECK
Battery Remaining Longevity: 106 mo
Battery Voltage: 2.93 V
Brady Statistic AP VP Percent: 48.44 %
Brady Statistic AP VS Percent: 0 %
Brady Statistic AS VP Percent: 51.53 %
Brady Statistic AS VS Percent: 0.03 %
Brady Statistic RA Percent Paced: 48.25 %
Brady Statistic RV Percent Paced: 99.96 %
Date Time Interrogation Session: 20210518120529
Implantable Lead Implant Date: 20190124
Implantable Lead Implant Date: 20190124
Implantable Lead Location: 753859
Implantable Lead Location: 753860
Implantable Lead Model: 5076
Implantable Lead Model: 5076
Implantable Pulse Generator Implant Date: 20190124
Lead Channel Impedance Value: 342 Ohm
Lead Channel Impedance Value: 380 Ohm
Lead Channel Impedance Value: 399 Ohm
Lead Channel Impedance Value: 494 Ohm
Lead Channel Pacing Threshold Amplitude: 0.625 V
Lead Channel Pacing Threshold Amplitude: 0.625 V
Lead Channel Pacing Threshold Pulse Width: 0.4 ms
Lead Channel Pacing Threshold Pulse Width: 0.4 ms
Lead Channel Sensing Intrinsic Amplitude: 10.75 mV
Lead Channel Sensing Intrinsic Amplitude: 2.375 mV
Lead Channel Sensing Intrinsic Amplitude: 2.5 mV
Lead Channel Sensing Intrinsic Amplitude: 22.375 mV
Lead Channel Setting Pacing Amplitude: 2 V
Lead Channel Setting Pacing Amplitude: 2.5 V
Lead Channel Setting Pacing Pulse Width: 0.4 ms
Lead Channel Setting Sensing Sensitivity: 4 mV

## 2020-03-28 NOTE — Patient Instructions (Addendum)
Medication Instructions:  NONE *If you need a refill on your cardiac medications before your next appointment, please call your pharmacy*   Lab Work: NONE If you have labs (blood work) drawn today and your tests are completely normal, you will receive your results only by: [name] Kitchen MyChart Message (if you have MyChart) OR . A paper copy in the mail If you have any lab test that is abnormal or we need to change your treatment, we will call you to review the results.   Testing/Procedures: NONE   Follow-Up: At [facility], you and your health needs are our priority.  As part of our continuing mission to provide you with exceptional heart care, we have created designated Provider Care Teams.  These Care Teams include your primary Cardiologist (physician) and Advanced Practice Providers (APPs -  Physician Assistants and Nurse Practitioners) who all work together to provide you with the care you need, when you need it.  We recommend signing up for the patient portal called "MyChart".  Sign up information is provided on this After Visit Summary.  MyChart is used to connect with patients for Virtual Visits (Telemedicine).  Patients are able to view lab/test results, encounter notes, upcoming appointments, etc.  Non-urgent messages can be sent to your provider as well.   To learn more about what you can do with MyChart, go to [email or web address].    Your next appointment:   3 months  The format for your next appointment:   Either In Person or Virtual  Provider:   Dr Curt Bears   Other Instructions Remote monitoring is used to monitor your Pacemaker from home. This monitoring reduces the number of office visits required to check your device to one time per year. It allows [place] to keep an eye on the functioning of your device to ensure it is working properly. You are scheduled for a device check from home on 06/26/20. You may send your transmission at any time that day. If you have a wireless  device, the transmission will be sent automatically. After your physician reviews your transmission, you will receive a postcard with your next transmission date.

## 2020-03-28 NOTE — Progress Notes (Signed)
Remote pacemaker transmission.   

## 2020-03-29 ENCOUNTER — Other Ambulatory Visit: Payer: Self-pay | Admitting: Family Medicine

## 2020-03-29 DIAGNOSIS — N1832 Chronic kidney disease, stage 3b: Secondary | ICD-10-CM | POA: Diagnosis not present

## 2020-04-03 ENCOUNTER — Other Ambulatory Visit: Payer: Self-pay | Admitting: Family Medicine

## 2020-04-03 ENCOUNTER — Other Ambulatory Visit: Payer: Self-pay | Admitting: Endocrinology

## 2020-04-03 DIAGNOSIS — L03031 Cellulitis of right toe: Secondary | ICD-10-CM

## 2020-04-04 ENCOUNTER — Other Ambulatory Visit: Payer: Self-pay | Admitting: Cardiology

## 2020-04-04 ENCOUNTER — Other Ambulatory Visit: Payer: Self-pay | Admitting: *Deleted

## 2020-04-05 ENCOUNTER — Other Ambulatory Visit: Payer: Self-pay

## 2020-04-05 DIAGNOSIS — L03031 Cellulitis of right toe: Secondary | ICD-10-CM

## 2020-04-05 MED ORDER — MUPIROCIN 2 % EX OINT: TOPICAL_OINTMENT | CUTANEOUS | 1 refills | Status: DC

## 2020-04-07 NOTE — Patient Outreach (Signed)
Steven Maldonado) Care Management  Steven Maldonado  04/04/2020 Late entry  Steven Maldonado Dec 11, 1940 [id number]  Steven Maldonado telephone call to patient.  [employer] compliance verified. Per patient he is doing good. Patient stated that fasting blood sugar is 116.  Patient is walking 1/2 mile a day. Patient has a free style libre 2. When his blood sugar is a little abnormal. He will check with old meter. Patient uses a cane at the house and a rollator walker when he goes out.perpatient he has had some recent falls with no injuries.. 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 Per patient he is trying to loose some weight and is looking at portion control. Per patient his wife is having weight surgery and he is going to use some of her diet to help him. RN discussed with patient about high protein diet and ketoacidosis. Patient stated that he understands and will not be eating all high protein. Patient has agreed to follow up outreach calls    Encounter Medications:  Outpatient Encounter Medications as of 04/04/2020  Medication Sig  . amiodarone (PACERONE) 200 MG tablet INCREASE AMIODARONE TO 400 MG ONCE DAILY FOR A MONTH, THEN REDUCE BACK TO 200 MG ONCE A DAY  . atorvastatin (LIPITOR) 80 MG tablet Take 1 tablet (80 mg total) by mouth at bedtime.  . BD PEN NEEDLE NANO 2ND GEN 32G X 4 MM MISC USE 4 (FOUR) TIMES DAILY.  [name] Kitchen Blood Glucose Monitoring Suppl (FREESTYLE LITE) DEVI 1 each by Does not apply route 2 (two) times daily.  . Choline Fenofibrate (FENOFIBRIC ACID) 135 MG CPDR Take 1 capsule by mouth daily.  . dabigatran (PRADAXA) 150 MG CAPS capsule Take 1 capsule (150 mg total) by mouth every 12 (twelve) hours.  . docusate sodium (COLACE) 100 MG capsule Take 100 mg by mouth 2 (two) times daily.  . Evolocumab 140 MG/ML SOSY Inject 140 mg into the skin every 14 (fourteen) days.  [name] Kitchen ezetimibe (ZETIA) 10 MG tablet TAKE 1 TABLET BY MOUTH EVERY DAY  . FREESTYLE LITE  test strip 1 EACH BY OTHER ROUTE 4 (FOUR) TIMES DAILY - BEFORE MEALS AND AT BEDTIME.  . furosemide (LASIX) 20 MG tablet Take 1 tablet (20 mg total) by mouth daily.  [name] Kitchen glipiZIDE (GLUCOTROL XL) 10 MG 24 hr tablet TAKE 1 TABLET (10 MG TOTAL) BY MOUTH DAILY WITH BREAKFAST.  [name] Kitchen Insulin Degludec (TRESIBA FLEXTOUCH) 200 UNIT/ML SOPN Inject 26 Units into the skin at bedtime.  . insulin lispro (HUMALOG KWIKPEN) 100 UNIT/ML KwikPen Inject 4-6 units under the skin up to three times daily if eating sweets or high carb foods.  [name] Kitchen levothyroxine (SYNTHROID) 200 MCG tablet Take 1 tablet (200 mcg total) by mouth daily before breakfast.  . metoprolol tartrate (LOPRESSOR) 50 MG tablet TAKE ONE AND ONE-HALF TABLET BY MOUTH TWICE A DAY  . mirabegron ER (MYRBETRIQ) 25 MG TB24 tablet Take 25 mg by mouth 4 (four) times a week.  . pentoxifylline (TRENTAL) 400 MG CR tablet Take 1 tablet (400 mg total) by mouth 3 (three) times daily with meals.  . Semaglutide,0.25 or 0.5MG /DOS, (OZEMPIC, 0.25 OR 0.5 MG/DOSE,) 2 MG/1.5ML SOPN Inject 0.5 mg into the skin once a week.  . silver sulfADIAZINE (SILVADENE) 1 % cream Apply 1 application topically daily.  . Vitamin D, Ergocalciferol, (DRISDOL) 1.25 MG (50000 UNIT) CAPS capsule Take 1 capsule (50,000 Units total) by mouth every 7 (seven) days.  . [DISCONTINUED] amiodarone (PACERONE) 200 MG  tablet Take 1 tablet (200 mg total) by mouth daily. Please make overdue appt with Dr. Angelena Form before anymore refills. 2nd attempt  . [DISCONTINUED] mupirocin ointment (BACTROBAN) 2 % If open sores develop, apply TID   No facility-administered encounter medications on file as of 04/04/2020.    Functional Status:  In your present state of health, do you have any difficulty performing the following activities: 01/06/2020 10/06/2019  Hearing? N N  Vision? N N  Difficulty concentrating or making decisions? N N  Walking or climbing stairs? N Y  Comment lt amb has a prosthesis -  Dressing or bathing? N  N  Comment - -  Doing errands, shopping? N N  Preparing Food and eating ? Y -  Comment per wife they eat out or she fixes it -  Using the Toilet? N -  In the past six months, have you accidently leaked urine? N -  Do you have problems with loss of bowel control? N -  Managing your Medications? - -  Managing your Finances? N -  Housekeeping or managing your Housekeeping? Y -  Comment wife handles all housekeeping -  Some recent data might be hidden    Fall/Depression Screening: Fall Risk  01/06/2020 10/06/2019 07/02/2019  Falls in the past year? 1 0 1  Number falls in past yr: 1 - 0  Injury with Fall? 0 - 0  Risk for fall due to : History of fall(s);Impaired balance/gait;Impaired mobility Orthopedic patient;Impaired mobility History of fall(s);Impaired balance/gait;Impaired mobility  Follow up Falls evaluation completed;Falls prevention discussed Falls evaluation completed Falls evaluation completed;Education provided;Falls prevention discussed   PHQ 2/9 Scores 01/24/2020 01/06/2020 10/06/2019 07/02/2019 12/14/2018 10/07/2018 07/28/2018  PHQ - 2 Score 0 0 0 0 0 0 0  PHQ- 9 Score 0 - 1 - - - -    Assessment:  A1C 7.3 decreased from 8.0 Fasting blood sugar 116 Patient is walking 1/2 mile a day Patient has had recent fall Patient has had prosthesis readjusted Patient is checking blood sugars as per ordered Patient is taking medications as per ordered   Plan:  A1C goal is 7.0 RN discussed healthy eating RN discussed exercise routine RN sent Exercise Program activity booklet RN sent educational material on meal planning and portion control RN will follow up outreach within the month of August  [name] [phone]

## 2020-04-12 DIAGNOSIS — C44319 Basal cell carcinoma of skin of other parts of face: Secondary | ICD-10-CM | POA: Diagnosis not present

## 2020-04-17 DIAGNOSIS — I350 Nonrheumatic aortic (valve) stenosis: Secondary | ICD-10-CM | POA: Diagnosis not present

## 2020-04-17 DIAGNOSIS — I129 Hypertensive chronic kidney disease with stage 1 through stage 4 chronic kidney disease, or unspecified chronic kidney disease: Secondary | ICD-10-CM | POA: Diagnosis not present

## 2020-04-17 DIAGNOSIS — I2510 Atherosclerotic heart disease of native coronary artery without angina pectoris: Secondary | ICD-10-CM | POA: Diagnosis not present

## 2020-04-17 DIAGNOSIS — E1122 Type 2 diabetes mellitus with diabetic chronic kidney disease: Secondary | ICD-10-CM | POA: Diagnosis not present

## 2020-04-17 DIAGNOSIS — N1832 Chronic kidney disease, stage 3b: Secondary | ICD-10-CM | POA: Diagnosis not present

## 2020-04-17 DIAGNOSIS — E559 Vitamin D deficiency, unspecified: Secondary | ICD-10-CM | POA: Diagnosis not present

## 2020-04-17 DIAGNOSIS — I5032 Chronic diastolic (congestive) heart failure: Secondary | ICD-10-CM | POA: Diagnosis not present

## 2020-04-20 ENCOUNTER — Ambulatory Visit (INDEPENDENT_AMBULATORY_CARE_PROVIDER_SITE_OTHER): Payer: Medicare Other | Admitting: Endocrinology

## 2020-04-20 ENCOUNTER — Other Ambulatory Visit: Payer: Self-pay

## 2020-04-20 ENCOUNTER — Encounter: Payer: Self-pay | Admitting: Endocrinology

## 2020-04-20 DIAGNOSIS — Z794 Long term (current) use of insulin: Secondary | ICD-10-CM

## 2020-04-20 DIAGNOSIS — E039 Hypothyroidism, unspecified: Secondary | ICD-10-CM | POA: Diagnosis not present

## 2020-04-20 DIAGNOSIS — E1165 Type 2 diabetes mellitus with hyperglycemia: Secondary | ICD-10-CM

## 2020-04-20 DIAGNOSIS — I2510 Atherosclerotic heart disease of native coronary artery without angina pectoris: Secondary | ICD-10-CM

## 2020-04-20 LAB — COMPREHENSIVE METABOLIC PANEL
ALT: 42 U/L (ref 0–53)
AST: 32 U/L (ref 0–37)
Albumin: 4.2 g/dL (ref 3.5–5.2)
Alkaline Phosphatase: 25 U/L — ABNORMAL LOW (ref 39–117)
BUN: 34 mg/dL — ABNORMAL HIGH (ref 6–23)
CO2: 26 mEq/L (ref 19–32)
Calcium: 9.9 mg/dL (ref 8.4–10.5)
Chloride: 104 mEq/L (ref 96–112)
Creatinine, Ser: 1.93 mg/dL — ABNORMAL HIGH (ref 0.40–1.50)
GFR: 33.97 mL/min — ABNORMAL LOW (ref >60.00)
Glucose, Bld: 74 mg/dL (ref 70–99)
Potassium: 4.4 mEq/L (ref 3.5–5.1)
Sodium: 139 mEq/L (ref 135–145)
Total Bilirubin: 0.4 mg/dL (ref 0.2–1.2)
Total Protein: 7.2 g/dL (ref 6.0–8.3)

## 2020-04-20 LAB — T4, FREE: Free T4: 3.09 ng/dL — ABNORMAL HIGH (ref 0.60–1.60)

## 2020-04-20 LAB — TSH: TSH: 0.08 u[IU]/mL — ABNORMAL LOW (ref 0.35–4.50)

## 2020-04-20 NOTE — Progress Notes (Signed)
Patient ID: Steven Maldonado, male   DOB: Dec 27, 1940, 79 y.o.   MRN: [id number]          Reason for Appointment: Follow-up for Type 2 Diabetes    History of Present Illness:          Date of diagnosis of type 2 diabetes mellitus:  1999      Background history:   He had been treated with metformin and glipizide for several years Metformin was stopped about 4 years ago because of renal dysfunction Presumably because of poor control he was started on insulin around the year 2009 He had been mostly on Lantus which was subsequently changed to [employer] and he thinks that Lantus worked better Also has been on Humalog 3-4 times a day for some time  Recent history:   Most recent A1c is 7.3, previously 8%  INSULIN regimen is:  TRESIBA 22 at bedtime, Humalog none, Ozempic 0.5 mg weekly  Non-insulin hypoglycemic drugs the patient is taking are: None  Current management, blood sugar patterns and problems identified:  He has been using the freestyle libre system again only 57% of the time  Discussion of his blood sugar patterns as below  His blood sugars are slightly lower than the actual readings but not clear how much the differences have been, likely about 10-20 mg  He only occasionally feels hypoglycemic with blood sugars in the 50s and 60s  Today his blood sugar was 65 in the office and he felt fine  However he has had a couple of low sugars during the night with symptoms  He has no nausea with Ozempic and has taken his first dose of 0.5 mg  Has not needed Humalog with starting Ozempic with blood sugar staying excellent throughout the day although not clear how his sugars are after lunch         Side effects from medications have been: None     Meal times are:  Breakfast is at 8 AM, dinnertime 5-6 PM             CONTINUOUS GLUCOSE MONITORING RECORD INTERPRETATION    Dates of Recording: Last 2 weeks  Sensor description: Elenor Legato version 2  Results statistics:   CGM use %  of time  57  Average and SD  106, GV 32  Time in range        88%  % Time Above 180  3  % Time above 250   % Time Below target  8    PRE-MEAL Fasting Lunch Dinner Bedtime Overall  Glucose range:       Mean/median:  98  103  106   116   POST-MEAL PC Breakfast PC Lunch PC Dinner  Glucose range:     Mean/median:  130  126  112    Glycemic patterns summary: Blood sugar monitoring is inadequate with gaps in the data between about 10 AM and 2 PM and no data on 6/6 with blood sugars are low normal or low early part of the night with some rise in the mornings.  Otherwise blood sugars are mildly increased in the afternoons and relatively stable the rest of the evening  Hyperglycemic episodes have been infrequent with only 1 spike in blood sugar on 6/8 after lunch  Hypoglycemic episodes occurred periodically overnight at least 2 nights with persistently low readings on 6/8 also had low readings early afternoon on 6/8 and 6/5 late afternoon  Overnight periods: Blood sugars are generally low normal Averaging  between 89 and 103  Preprandial periods: Blood sugars are near normal throughout the day  Postprandial periods:   Except for one episode after lunch and blood sugars are relatively flat throughout the day  Previous data:  CGM use % of time  56  2-week average/SD  156, GV 38  Time in range     60   %  % Time Above 180  29  % Time above 250  6  % Time Below 70  5     PRE-MEAL Fasting Lunch Dinner Bedtime Overall  Glucose range:       Averages:  109  85  181   156   POST-MEAL PC Breakfast PC Lunch PC Dinner  Glucose range:     Averages:  113?   165  202    Previous data:   CGM use % of time  64  Average and SD  142, GV 36  Time in range  63      %  % Time Above 180  27  % Time above 250   % Time Below target  9     PRE-MEAL Fasting Lunch Dinner Bedtime Overall  Glucose range:       Mean/median:  85 ?  173   189  142   POST-MEAL PC Breakfast PC Lunch PC Dinner   Glucose range:     Mean/median:  115   176    Dietician visit, most recent: 9/19  Weight history:  Wt Readings from Last 3 Encounters:  04/20/20 238 lb 12.8 oz (108.3 kg)  03/28/20 239 lb 1.9 oz (108.5 kg)  03/09/20 250 lb 9.6 oz (113.7 kg)    Glycemic control:   Lab Results  Component Value Date   HGBA1C 7.3 (A) 03/09/2020   HGBA1C 8.0 (H) 12/03/2019   HGBA1C 7.6 (H) 06/04/2019   Lab Results  Component Value Date   MICROALBUR 3.0 (H) 12/03/2019   LDLCALC 106 (H) 01/24/2020   CREATININE 1.90 (H) 03/06/2020   Lab Results  Component Value Date   MICRALBCREAT 1.7 12/03/2019    Lab Results  Component Value Date   FRUCTOSAMINE 268 06/26/2018    No visits with results within 1 Week(s) from this visit.  Latest known visit with results is:  Office Visit on 03/28/2020  Component Date Value Ref Range Status  . Date Time Interrogation Session 03/28/2020 [id number]   Final  . Pulse Generator Manufacturer 03/28/2020 MERM   Final  . Pulse Gen Model 03/28/2020 [id number] XT DR MRI   Final  . Pulse Gen Serial Number 03/28/2020 [id number]   Final  . Clinic Name 03/28/2020 [facility]   Final  . Implantable Pulse Generator Type 03/28/2020 Implantable Pulse Generator   Final  . Implantable Pulse Generator Implan* 03/28/2020 [id number]   Final  . Implantable Lead Manufacturer 03/28/2020 MERM   Final  . Implantable Lead Model 03/28/2020 5076 CapSureFix Novus MRI SureScan   Final  . Implantable Lead Serial Number 03/28/2020 [id number]   Final  . Implantable Lead Implant Date 03/28/2020 [id number]   Final  . Implantable Lead Location Detail 1 03/28/2020 APPENDAGE   Final  . Implantable Lead Location 03/28/2020 [id number]   Final  . Implantable Lead Manufacturer 03/28/2020 MERM   Final  . Implantable Lead Model 03/28/2020 5076 CapSureFix Novus MRI SureScan   Final  . Implantable Lead Serial Number 03/28/2020 [id number]   Final  . Implantable Lead Implant Date 03/28/2020  [id number]   Final  .  Implantable Lead Location Detail 1 03/28/2020 SEPTUM   Final  . Implantable Lead Location 03/28/2020 [id number]   Final  . Lead Channel Setting Sensing Sensi* 03/28/2020 4  mV Final  . Lead Channel Setting Pacing Amplit* 03/28/2020 2  V Final  . Lead Channel Setting Pacing Pulse * 03/28/2020 0.4  ms Final  . Lead Channel Setting Pacing Amplit* 03/28/2020 2.5  V Final  . Lead Channel Impedance Value 03/28/2020 380  ohm Final  . Lead Channel Impedance Value 03/28/2020 342  ohm Final  . Lead Channel Sensing Intrinsic Amp* 03/28/2020 2.5  mV Final  . Lead Channel Sensing Intrinsic Amp* 03/28/2020 2.375  mV Final  . Lead Channel Pacing Threshold Ampl* 03/28/2020 0.625  V Final  . Lead Channel Pacing Threshold Puls* 03/28/2020 0.4  ms Final  . Lead Channel Impedance Value 03/28/2020 494  ohm Final  . Lead Channel Impedance Value 03/28/2020 399  ohm Final  . Lead Channel Sensing Intrinsic Amp* 03/28/2020 22.375  mV Final  . Lead Channel Sensing Intrinsic Amp* 03/28/2020 10.75  mV Final  . Lead Channel Pacing Threshold Ampl* 03/28/2020 0.625  V Final  . Lead Channel Pacing Threshold Puls* 03/28/2020 0.4  ms Final  . Battery Status 03/28/2020 OK   Final  . Battery Remaining Longevity 03/28/2020 106  mo Final  . Battery Voltage 03/28/2020 2.93  V Final  . Brady Statistic RA Percent Paced 03/28/2020 48.25  % Final  . Brady Statistic RV Percent Paced 03/28/2020 99.96  % Final  . Brady Statistic AP VP Percent 03/28/2020 48.44  % Final  . Brady Statistic AS VP Percent 03/28/2020 51.53  % Final  . Brady Statistic AP VS Percent 03/28/2020 0  % Final  . Brady Statistic AS VS Percent 03/28/2020 0.03  % Final    Allergies as of 04/20/2020   No Known Allergies     Medication List       Accurate as of April 20, 2020  4:01 PM. If you have any questions, ask your nurse or doctor.        amiodarone 200 MG tablet Commonly known as: PACERONE INCREASE AMIODARONE TO 400 MG ONCE DAILY FOR  A MONTH, THEN REDUCE BACK TO 200 MG ONCE A DAY   atorvastatin 80 MG tablet Commonly known as: LIPITOR Take 1 tablet (80 mg total) by mouth at bedtime.   BD Pen Needle Nano 2nd Gen 32G X 4 MM Misc Generic drug: Insulin Pen Needle USE 4 (FOUR) TIMES DAILY.   dabigatran 150 MG Caps capsule Commonly known as: Pradaxa Take 1 capsule (150 mg total) by mouth every 12 (twelve) hours.   docusate sodium 100 MG capsule Commonly known as: COLACE Take 100 mg by mouth 2 (two) times daily.   Evolocumab 140 MG/ML Sosy Inject 140 mg into the skin every 14 (fourteen) days.   ezetimibe 10 MG tablet Commonly known as: ZETIA TAKE 1 TABLET BY MOUTH EVERY DAY   Fenofibric Acid 135 MG Cpdr Take 1 capsule by mouth daily.   FreeStyle Lite Devi 1 each by Does not apply route 2 (two) times daily.   FREESTYLE LITE test strip Generic drug: glucose blood 1 EACH BY OTHER ROUTE 4 (FOUR) TIMES DAILY - BEFORE MEALS AND AT BEDTIME.   furosemide 20 MG tablet Commonly known as: LASIX Take 1 tablet (20 mg total) by mouth daily.   glipiZIDE 10 MG 24 hr tablet Commonly known as: GLUCOTROL XL TAKE 1 TABLET (10 MG TOTAL) BY MOUTH  DAILY WITH BREAKFAST.   insulin lispro 100 UNIT/ML KwikPen Commonly known as: HumaLOG KwikPen Inject 4-6 units under the skin up to three times daily if eating sweets or high carb foods.   levothyroxine 200 MCG tablet Commonly known as: SYNTHROID Take 1 tablet (200 mcg total) by mouth daily before breakfast.   metoprolol tartrate 50 MG tablet Commonly known as: LOPRESSOR TAKE ONE AND ONE-HALF TABLET BY MOUTH TWICE A DAY   mirabegron ER 25 MG Tb24 tablet Commonly known as: MYRBETRIQ Take 25 mg by mouth 4 (four) times a week.   mupirocin ointment 2 % Commonly known as: BACTROBAN If open sores develop, apply TID   Ozempic (0.25 or 0.5 MG/DOSE) 2 MG/1.5ML Sopn Generic drug: Semaglutide(0.25 or 0.5MG /DOS) Inject 0.5 mg into the skin once a week.   pentoxifylline 400 MG CR  tablet Commonly known as: TRENTAL Take 1 tablet (400 mg total) by mouth 3 (three) times daily with meals.   silver sulfADIAZINE 1 % cream Commonly known as: Silvadene Apply 1 application topically daily.   [name] FlexTouch 200 UNIT/ML FlexTouch Pen Generic drug: insulin degludec Inject 26 Units into the skin at bedtime.   Vitamin D (Ergocalciferol) 1.25 MG (50000 UNIT) Caps capsule Commonly known as: DRISDOL Take 1 capsule (50,000 Units total) by mouth every 7 (seven) days.       Allergies: No Known Allergies  Past Medical History:  Diagnosis Date  . Chronic diastolic CHF (congestive heart failure) ([place])   . CKD (chronic kidney disease), stage III   . Constipation   . Coronary artery disease    a. Cath February 2012 in [facility], occluded RCA with collaterals  . DM type 2 (diabetes mellitus, type 2) ([place])   . Essential hypertension   . Hyperlipidemia   . Neuropathy    feet  . Pacemaker    a. symptomatic brady after TAVR s/p MDT PPM by Dr. Curt Bears 12/04/17  . Persistent atrial fibrillation ([place])   . PONV (postoperative nausea and vomiting)    after valve surgery  . PVD (peripheral vascular disease) ([place])    a. s/p R popliteal artery stenosis tx with drug-coated balloon 05/2014, followed by Dr. Fletcher Anon.  . 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 Severe aortic stenosis    a. 12/02/17: s/p TAVR  . Skin cancer   . Sleep apnea with use of continuous positive airway pressure (CPAP)    04-11-11 AHI was 32.9 and titrated to 15 cm H20, DME is [facility]  . Subclinical hypothyroidism     Past Surgical History:  Procedure Laterality Date  . ABDOMINAL ANGIOGRAM N/A 06/08/2014   Procedure: ABDOMINAL ANGIOGRAM;  Surgeon: Wellington Hampshire, MD;  Location[facility] CATH LAB;  Service: Cardiovascular;  Laterality: N/A;  . ABDOMINAL AORTOGRAM N/A 04/09/2018   Procedure:  ABDOMINAL AORTOGRAM;  Surgeon: Conrad Kearney, MD;  Location: [place] CV LAB;  Service: Cardiovascular;  Laterality: N/A;  . AMPUTATION Left 04/17/2018   Procedure: LEFT FOOT 3RD RAY AMPUTATION;  Surgeon: Newt Minion, MD;  Location: [place];  Service: Orthopedics;  Laterality: Left;  . AMPUTATION Left 05/28/2018   Procedure: LEFT AMPUTATION BELOW KNEE;  Surgeon: Wylene Simmer, MD;  Location: [place];  Service: Orthopedics;  Laterality: Left;  . APPENDECTOMY  1965  . 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 CARDIAC CATHETERIZATION  12/2010  . CARDIOVERSION  07/2011  .  CARDIOVERSION N/A 04/18/2014   Procedure: CARDIOVERSION;  Surgeon: Dorothy Spark, MD;  Location: [place];  Service: Cardiovascular;  Laterality: N/A;  . CARDIOVERSION N/A 11/03/2015   Procedure: CARDIOVERSION;  Surgeon: Lelon Perla, MD;  Location: [facility] ENDOSCOPY;  Service: Cardiovascular;  Laterality: N/A;  . CARDIOVERSION N/A 05/08/2017   Procedure: CARDIOVERSION;  Surgeon: Dorothy Spark, MD;  Location: [facility] ENDOSCOPY;  Service: Cardiovascular;  Laterality: N/A;  . CARDIOVERSION N/A 07/28/2017   Procedure: CARDIOVERSION;  Surgeon: Dorothy Spark, MD;  Location[facility] ENDOSCOPY;  Service: Cardiovascular;  Laterality: N/A;  . CARDIOVERSION N/A 02/08/2020   Procedure: CARDIOVERSION;  Surgeon: Buford Dresser, MD;  Location: [facility] ENDOSCOPY;  Service: Cardiovascular;  Laterality: N/A;  . CARDIOVERSION N/A 03/15/2020   Procedure: CARDIOVERSION;  Surgeon: Acie Fredrickson Wonda Cheng, MD;  Location: [place];  Service: Cardiovascular;  Laterality: N/A;  . LOWER EXTREMITY ANGIOGRAM N/A 06/08/2014   Procedure: LOWER EXTREMITY ANGIOGRAM;  Surgeon: Wellington Hampshire, MD;  Location: [facility] CATH LAB;  Service: Cardiovascular;  Laterality: N/A;  . LOWER EXTREMITY ANGIOGRAPHY Left 04/09/2018   Procedure: Lower Extremity Angiography;  Surgeon: Conrad McAdenville, MD;  Location: [place] CV LAB;  Service: Cardiovascular;  Laterality: Left;  .  PACEMAKER IMPLANT N/A 12/04/2017   Procedure: PACEMAKER IMPLANT;  Surgeon: Constance Haw, MD;  Location: [place] CV LAB;  Service: Cardiovascular;  Laterality: N/A;  . PERIPHERAL VASCULAR BALLOON ANGIOPLASTY Left 04/09/2018   Procedure: PERIPHERAL VASCULAR BALLOON ANGIOPLASTY;  Surgeon: Conrad Patterson, MD;  Location: [place] CV LAB;  Service: Cardiovascular;  Laterality: Left;  SFA  . POPLITEAL ARTERY ANGIOPLASTY Right 06/08/2014   Archie Endo 06/08/2014  . RIGHT/LEFT HEART CATH AND CORONARY ANGIOGRAPHY N/A 10/08/2017   Procedure: RIGHT/LEFT HEART CATH AND CORONARY ANGIOGRAPHY;  Surgeon: Burnell Blanks, MD;  Location: [place] CV LAB;  Service: Cardiovascular;  Laterality: N/A;  . SKIN CANCER EXCISION Bilateral    "have had them cut off back of neck X 2; off left upper arm; right wrist, near right shoulder blade" (06/08/2014)  . TEE WITHOUT CARDIOVERSION N/A 12/02/2017   Procedure: TRANSESOPHAGEAL ECHOCARDIOGRAM (TEE);  Surgeon: Burnell Blanks, MD;  Location: [place];  Service: Open Heart Surgery;  Laterality: N/A;  . TEMPORARY PACEMAKER N/A 12/04/2017   Procedure: TEMPORARY PACEMAKER;  Surgeon: Leonie Man, MD;  Location: [place] CV LAB;  Service: Cardiovascular;  Laterality: N/A;  . TRANSCATHETER AORTIC VALVE REPLACEMENT, TRANSFEMORAL N/A 12/02/2017   Procedure: TRANSCATHETER AORTIC VALVE REPLACEMENT, TRANSFEMORAL;  Surgeon: Burnell Blanks, MD;  Location: [place];  Service: Open Heart Surgery;  Laterality: N/A;  using Edwards Sapien 3 Transcatheter Heart Valve size 67mm    Family History  Problem Relation Age of Onset  . Diabetes Mother   . Heart attack Mother   . Hypertension Mother   . Heart attack Father   . Heart failure Father   . Hypertension Father   . Diabetes Father   . Diabetes Sister   . Diabetes Brother   . Diabetes Other   . Diabetes Daughter        TYPE ll  . Heart Problems Daughter   . Hypertension Sister   . Hypertension Brother    . Stroke Brother     Social History:  reports that he quit smoking about 47 years ago. His smoking use included cigarettes. He has a 28.00 pack-year smoking history. He has never used smokeless tobacco. He reports that he does not drink alcohol and does not use  drugs.   Review of Systems   Lipid history: He is taking 80 mg atorvastatin and his LDL also without adequate control, high-dose with history of CAD He is not sure if he is taking his medication regularly again LDL is somewhat better with adding Zetia,    Lab Results  Component Value Date   CHOL 161 01/24/2020   CHOL 219 (H) 05/31/2019   CHOL 131 08/18/2018   Lab Results  Component Value Date   HDL 34 (L) 01/24/2020   HDL 36 (L) 05/31/2019   HDL 30.30 (L) 08/18/2018   Lab Results  Component Value Date   LDLCALC 106 (H) 01/24/2020   LDLCALC 142 (H) 05/31/2019   LDLCALC 93 10/13/2017   Lab Results  Component Value Date   TRIG 113 01/24/2020   TRIG 264 (H) 05/31/2019   TRIG 219.0 (H) 08/18/2018   Lab Results  Component Value Date   CHOLHDL 4.7 01/24/2020   CHOLHDL 6.1 (H) 05/31/2019   CHOLHDL 4 08/18/2018   Lab Results  Component Value Date   LDLDIRECT 123.0 12/03/2019   LDLDIRECT 77.0 08/18/2018            Hypertension: Has been controlled with only antihypertensive being metoprolol, not on ACE inhibitor  BP Readings from Last 3 Encounters:  04/20/20 (!) 102/52  03/28/20 118/64  03/15/20 (!) 132/59    Most recent eye exam was in 12/18 ?  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 History of symptomatic neuropathy  He has chronic kidney disease of unclear etiology, is followed by nephrologist periodically   Lab Results  Component Value Date   CREATININE 1.90 (H) 03/06/2020   CREATININE 1.80 (H) 02/02/2020   CREATININE 1.96 (H) 01/24/2020       Physical Examination:  BP (!) 102/52 (BP Location: Left Arm, Patient Position: Sitting, Cuff Size: Large)   Pulse 77   Ht 5\' 11"  (1.803 m)   Wt 238 lb 12.8 oz (108.3 kg)   SpO2 97%   BMI 33.31 kg/m          ASSESSMENT:  Diabetes type 2, insulin requiring with moderate obesity  See history of present illness for detailed discussion of current diabetes management, blood sugar patterns and problems identified  A1c was last 7.3  However with now adding Ozempic his blood sugars are overall even better and mostly low normal Only taking basal insulin and has not needed any mealtime coverage With Ozempic he has been able to cut back on his portions and is losing weight   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: Has been on 200 mcg levothyroxine and needs follow-up TSH Also on amiodarone    PLAN:   He needs to check his blood sugars with the freestyle libre more consistently especially in the afternoon Tresiba to 18 units instead of 22 As he continues to 0.5 Ozempic he can continue to reduce the basal insulin if morning sugars are showing below 70 on the [place] if having any issues  We will call him with the results of his thyroid functions  Patient Instructions  [name] 18 units and reduce by 2  Units weekly  if am sugar stays <70      Elayne Snare 04/20/2020, 4:01 PM   Note: This office note was prepared with Dragon voice recognition system technology. Any transcriptional errors that result from this process are unintentional.

## 2020-04-20 NOTE — Patient Instructions (Addendum)
Tresiba 18 units and reduce by 2  Units weekly  if am sugar stays <70

## 2020-04-21 ENCOUNTER — Telehealth: Payer: Self-pay | Admitting: Endocrinology

## 2020-04-21 NOTE — Telephone Encounter (Signed)
Refer to lab results note for further information.

## 2020-04-21 NOTE — Telephone Encounter (Signed)
Patient said he was returning a call to Olen Cordial - did not know what call was in regards to. Patient ph# [phone]

## 2020-04-21 NOTE — Progress Notes (Signed)
Thyroid level is higher than normal.  Need to know if he is taking 1 tablet of the 200 mcg levothyroxine or 2 daily.  Also if he is taking any vitamins containing biotin.  Kidney function about the same as before

## 2020-04-24 NOTE — Progress Notes (Signed)
We will need to change his levothyroxine from 200 mcg daily down to 175 mcg daily

## 2020-04-25 ENCOUNTER — Other Ambulatory Visit: Payer: Self-pay

## 2020-04-26 ENCOUNTER — Other Ambulatory Visit: Payer: Self-pay | Admitting: Cardiology

## 2020-04-30 ENCOUNTER — Other Ambulatory Visit: Payer: Self-pay | Admitting: Cardiology

## 2020-04-30 ENCOUNTER — Other Ambulatory Visit: Payer: Self-pay | Admitting: Endocrinology

## 2020-05-19 ENCOUNTER — Other Ambulatory Visit: Payer: Self-pay | Admitting: Endocrinology

## 2020-05-25 ENCOUNTER — Other Ambulatory Visit: Payer: Self-pay

## 2020-05-25 ENCOUNTER — Ambulatory Visit (INDEPENDENT_AMBULATORY_CARE_PROVIDER_SITE_OTHER): Payer: Medicare Other | Admitting: Physician Assistant

## 2020-05-25 VITALS — BP 107/61 | HR 89 | Temp 97.5°F | Ht 71.0 in | Wt 243.5 lb

## 2020-05-25 DIAGNOSIS — E1169 Type 2 diabetes mellitus with other specified complication: Secondary | ICD-10-CM | POA: Diagnosis not present

## 2020-05-25 DIAGNOSIS — E1122 Type 2 diabetes mellitus with diabetic chronic kidney disease: Secondary | ICD-10-CM

## 2020-05-25 DIAGNOSIS — E1159 Type 2 diabetes mellitus with other circulatory complications: Secondary | ICD-10-CM | POA: Diagnosis not present

## 2020-05-25 DIAGNOSIS — E11649 Type 2 diabetes mellitus with hypoglycemia without coma: Secondary | ICD-10-CM

## 2020-05-25 DIAGNOSIS — B351 Tinea unguium: Secondary | ICD-10-CM

## 2020-05-25 DIAGNOSIS — I10 Essential (primary) hypertension: Secondary | ICD-10-CM | POA: Diagnosis not present

## 2020-05-25 DIAGNOSIS — E782 Mixed hyperlipidemia: Secondary | ICD-10-CM | POA: Diagnosis not present

## 2020-05-25 DIAGNOSIS — I152 Hypertension secondary to endocrine disorders: Secondary | ICD-10-CM

## 2020-05-25 DIAGNOSIS — I6523 Occlusion and stenosis of bilateral carotid arteries: Secondary | ICD-10-CM

## 2020-05-25 DIAGNOSIS — E119 Type 2 diabetes mellitus without complications: Secondary | ICD-10-CM

## 2020-05-25 DIAGNOSIS — Z794 Long term (current) use of insulin: Secondary | ICD-10-CM

## 2020-05-25 LAB — POCT UA - MICROALBUMIN
Albumin/Creatinine Ratio, Urine, POC: <30
Creatinine, POC: 200 mg/dL
Microalbumin Ur, POC: 10 mg/L

## 2020-05-25 LAB — POCT GLYCOSYLATED HEMOGLOBIN (HGB A1C): Hemoglobin A1C: 7.3 % — AB (ref 4.0–5.6)

## 2020-05-25 NOTE — Progress Notes (Signed)
Established Patient Office Visit  Subjective:  Patient ID: Steven Maldonado, male    DOB: Mar 20, 1941  Age: 79 y.o. MRN: [id number]  CC:  Chief Complaint  Patient presents with  . Diabetes  . Hyperlipidemia  . Hypertension    HPI Steven Maldonado presents for diabetes mellitus, hypertension, and hyperlipidemia.  Diabetes: Followed by endocrinology-Dr. Dwyane Dee. Pt denies increased urination or thirst. Pt reports medication compliance. Has dexcom sensor and FBS vary.  HTN: Pt denies chest pain, palpitations, dizziness or lower extremity swelling. Taking medication as directed without side effects. Pt follows a low salt diet.  HLD: Followed by cardiology. Pt taking medication as directed without issues. Denies side effects including myalgias and RUQ pain.   Right great toe: Reports nail is loose and wants referral to specialist.  Past Medical History:  Diagnosis Date  . Chronic diastolic CHF (congestive heart failure) ([place])   . CKD (chronic kidney disease), stage III   . Constipation   . Coronary artery disease    a. Cath February 2012 in [facility], occluded RCA with collaterals  . DM type 2 (diabetes mellitus, type 2) ([place])   . Essential hypertension   . Hyperlipidemia   . Neuropathy    feet  . Pacemaker    a. symptomatic brady after TAVR s/p MDT PPM by Dr. Curt Bears 12/04/17  . Persistent atrial fibrillation ([place])   . PONV (postoperative nausea and vomiting)    after valve surgery  . PVD (peripheral vascular disease) ([place])    a. s/p R popliteal artery stenosis tx with drug-coated balloon 05/2014, followed by Dr. Fletcher Anon.  . 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 Severe aortic stenosis    a. 12/02/17: s/p TAVR  . Skin cancer   . Sleep apnea with use of continuous positive airway pressure (CPAP)    04-11-11 AHI was 32.9 and titrated to 15 cm H20, DME is [facility]   . Subclinical hypothyroidism     Past Surgical History:  Procedure Laterality Date  . ABDOMINAL ANGIOGRAM N/A 06/08/2014   Procedure: ABDOMINAL ANGIOGRAM;  Surgeon: Wellington Hampshire, MD;  Location: [facility] CATH LAB;  Service: Cardiovascular;  Laterality: N/A;  . ABDOMINAL AORTOGRAM N/A 04/09/2018   Procedure: ABDOMINAL AORTOGRAM;  Surgeon: Conrad Clearview, MD;  Location: [facility] CV LAB;  Service: Cardiovascular;  Laterality: N/A;  . AMPUTATION Left 04/17/2018   Procedure: LEFT FOOT 3RD RAY AMPUTATION;  Surgeon: Newt Minion, MD;  Location: [facility];  Service: Orthopedics;  Laterality: Left;  . AMPUTATION Left 05/28/2018   Procedure: LEFT AMPUTATION BELOW KNEE;  Surgeon: Wylene Simmer, MD;  Location: [facility];  Service: Orthopedics;  Laterality: Left;  . APPENDECTOMY  1965  . 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 CARDIAC CATHETERIZATION  12/2010  . CARDIOVERSION  07/2011  . CARDIOVERSION N/A 04/18/2014   Procedure: CARDIOVERSION;  Surgeon: Dorothy Spark, MD;  Location: [facility] ENDOSCOPY;  Service: Cardiovascular;  Laterality: N/A;  . CARDIOVERSION N/A 11/03/2015   Procedure: CARDIOVERSION;  Surgeon: Lelon Perla, MD;  Location: [facility] ENDOSCOPY;  Service: Cardiovascular;  Laterality: N/A;  . CARDIOVERSION N/A 05/08/2017   Procedure: CARDIOVERSION;  Surgeon: Dorothy Spark, MD;  Location: [facility] ENDOSCOPY;  Service: Cardiovascular;  Laterality: N/A;  . CARDIOVERSION N/A 07/28/2017   Procedure: CARDIOVERSION;  Surgeon: Dorothy Spark, MD;  Location: [facility];  Service: Cardiovascular;  Laterality:  N/A;  . CARDIOVERSION N/A 02/08/2020   Procedure: CARDIOVERSION;  Surgeon: Buford Dresser, MD;  Location: [facility] ENDOSCOPY;  Service: Cardiovascular;  Laterality: N/A;  . CARDIOVERSION N/A 03/15/2020   Procedure: CARDIOVERSION;  Surgeon: Thayer Headings, MD;  Location: [place];  Service: Cardiovascular;  Laterality: N/A;  . LOWER EXTREMITY ANGIOGRAM N/A 06/08/2014   Procedure: LOWER EXTREMITY  ANGIOGRAM;  Surgeon: Wellington Hampshire, MD;  Location: [facility] CATH LAB;  Service: Cardiovascular;  Laterality: N/A;  . LOWER EXTREMITY ANGIOGRAPHY Left 04/09/2018   Procedure: Lower Extremity Angiography;  Surgeon: Conrad Mil[place] Mill, MD;  Location: [place] CV LAB;  Service: Cardiovascular;  Laterality: Left;  . PACEMAKER IMPLANT N/A 12/04/2017   Procedure: PACEMAKER IMPLANT;  Surgeon: Constance Haw, MD;  Location: [place] CV LAB;  Service: Cardiovascular;  Laterality: N/A;  . PERIPHERAL VASCULAR BALLOON ANGIOPLASTY Left 04/09/2018   Procedure: PERIPHERAL VASCULAR BALLOON ANGIOPLASTY;  Surgeon: Conrad Rivesville, MD;  Location: [facility] CV LAB;  Service: Cardiovascular;  Laterality: Left;  SFA  . POPLITEAL ARTERY ANGIOPLASTY Right 06/08/2014   Archie Endo 06/08/2014  . RIGHT/LEFT HEART CATH AND CORONARY ANGIOGRAPHY N/A 10/08/2017   Procedure: RIGHT/LEFT HEART CATH AND CORONARY ANGIOGRAPHY;  Surgeon: Burnell Blanks, MD;  Location: [facility] CV LAB;  Service: Cardiovascular;  Laterality: N/A;  . SKIN CANCER EXCISION Bilateral    "have had them cut off back of neck X 2; off left upper arm; right wrist, near right shoulder blade" (06/08/2014)  . TEE WITHOUT CARDIOVERSION N/A 12/02/2017   Procedure: TRANSESOPHAGEAL ECHOCARDIOGRAM (TEE);  Surgeon: Burnell Blanks, MD;  Location: [facility];  Service: Open Heart Surgery;  Laterality: N/A;  . TEMPORARY PACEMAKER N/A 12/04/2017   Procedure: TEMPORARY PACEMAKER;  Surgeon: Leonie Man, MD;  Location: [facility] CV LAB;  Service: Cardiovascular;  Laterality: N/A;  . TRANSCATHETER AORTIC VALVE REPLACEMENT, TRANSFEMORAL N/A 12/02/2017   Procedure: TRANSCATHETER AORTIC VALVE REPLACEMENT, TRANSFEMORAL;  Surgeon: Burnell Blanks, MD;  Location: [facility];  Service: Open Heart Surgery;  Laterality: N/A;  using Edwards Sapien 3 Transcatheter Heart Valve size 45mm    Family History  Problem Relation Age of Onset  . Diabetes Mother   . Heart attack  Mother   . Hypertension Mother   . Heart attack Father   . Heart failure Father   . Hypertension Father   . Diabetes Father   . Diabetes Sister   . Diabetes Brother   . Diabetes Other   . Diabetes Daughter        TYPE ll  . Heart Problems Daughter   . Hypertension Sister   . Hypertension Brother   . Stroke Brother     Social History   Socioeconomic History  . Marital status: Married    Spouse name: [name]  . Number of children: 1  . Years of education: 65  . Highest education level: Not on file  Occupational History  . Occupation: retired    Fish farm manager: [employer]  Tobacco Use  . Smoking status: Former Smoker    Packs/day: 2.00    Years: 14.00    Pack years: 28.00    Types: Cigarettes    Quit date: 11/11/1972    Years since quitting: 47.6  . Smokeless tobacco: Never Used  Vaping Use  . Vaping Use: Never used  Substance and Sexual Activity  . Alcohol use: No    Comment: quit in 1984  . Drug use: No  . Sexual activity: Not Currently  Other Topics  Concern  . 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   Patient has one child and his wife has one child.   Patient is retired.   Patient has a high school education.   Patient is right-handed.   Patient drinks very little caffeine.   Social Determinants of Health   Financial Resource Strain:   . Difficulty of Paying Living Expenses:   Food Insecurity: No Food Insecurity  . Worried About Charity fundraiser in the Last Year: Never true  . Ran Out of Food in the Last Year: Never true  Transportation Needs: No Transportation Needs  . Lack of Transportation (Medical): No  . Lack of Transportation (Non-Medical): No  Physical Activity:   . Days of Exercise per Week:   . Minutes of Exercise per Session:   Stress:   . Feeling of Stress :   Social Connections:   . Frequency of Communication with Friends and Family:   . Frequency of Social Gatherings  with Friends and Family:   . Attends Religious Services:   . Active Member of Clubs or Organizations:   . Attends Archivist Meetings:   [name] Kitchen Marital Status:   Intimate Partner Violence:   . Fear of Current or Ex-Partner:   . Emotionally Abused:   [name] Kitchen Physically Abused:   . Sexually Abused:     Outpatient Medications Prior to Visit  Medication Sig Dispense Refill  . amiodarone (PACERONE) 200 MG tablet INCREASE AMIODARONE TO 400 MG ONCE DAILY FOR A MONTH, THEN REDUCE BACK TO 200 MG ONCE A DAY 30 tablet 2  . atorvastatin (LIPITOR) 80 MG tablet Take 1 tablet (80 mg total) by mouth at bedtime. 90 tablet 3  . BD PEN NEEDLE NANO 2ND GEN 32G X 4 MM MISC USE 4 (FOUR) TIMES DAILY. 200 each 2  . Blood Glucose Monitoring Suppl (FREESTYLE LITE) DEVI 1 each by Does not apply route 2 (two) times daily. 1 each 0  . Choline Fenofibrate (FENOFIBRIC ACID) 135 MG CPDR Take 1 capsule by mouth daily. 90 capsule 0  . dabigatran (PRADAXA) 150 MG CAPS capsule Take 1 capsule (150 mg total) by mouth every 12 (twelve) hours. 180 capsule 3  . docusate sodium (COLACE) 100 MG capsule Take 100 mg by mouth 2 (two) times daily.    . Evolocumab 140 MG/ML SOSY Inject 140 mg into the skin every 14 (fourteen) days. 2.1 mL 2  . ezetimibe (ZETIA) 10 MG tablet TAKE 1 TABLET BY MOUTH EVERY DAY 90 tablet 1  . FREESTYLE LITE test strip 1 EACH BY OTHER ROUTE 4 (FOUR) TIMES DAILY - BEFORE MEALS AND AT BEDTIME. 450 strip 3  . furosemide (LASIX) 20 MG tablet Take 1 tablet (20 mg total) by mouth daily. 90 tablet 3  . glipiZIDE (GLUCOTROL XL) 10 MG 24 hr tablet TAKE 1 TABLET (10 MG TOTAL) BY MOUTH DAILY WITH BREAKFAST. 90 tablet 0  . Insulin Degludec (TRESIBA FLEXTOUCH) 200 UNIT/ML SOPN Inject 26 Units into the skin at bedtime. 15 mL 1  . insulin lispro (HUMALOG KWIKPEN) 100 UNIT/ML KwikPen Inject 4-6 units under the skin up to three times daily if eating sweets or high carb foods. 30 mL 1  . levothyroxine (SYNTHROID) 175 MCG  tablet Take 175 mcg by mouth daily before breakfast.    . levothyroxine (SYNTHROID) 200 MCG tablet Take 1 tablet by mouth Monday-Saturday, and take 1/2 tablet on Sunday  30 tablet 1  . metoprolol tartrate (LOPRESSOR) 50 MG tablet TAKE ONE AND ONE-HALF TABLET BY MOUTH TWICE A DAY 270 tablet 0  . mirabegron ER (MYRBETRIQ) 25 MG TB24 tablet Take 25 mg by mouth 4 (four) times a week.    . mupirocin ointment (BACTROBAN) 2 % If open sores develop, apply TID 30 g 1  . pentoxifylline (TRENTAL) 400 MG CR tablet Take 1 tablet (400 mg total) by mouth 3 (three) times daily with meals. 90 tablet 3  . Semaglutide,0.25 or 0.5MG /DOS, (OZEMPIC, 0.25 OR 0.5 MG/DOSE,) 2 MG/1.5ML SOPN Inject 0.5 mg into the skin once a week. 1 pen 2  . silver sulfADIAZINE (SILVADENE) 1 % cream Apply 1 application topically daily. 50 g 0  . Vitamin D, Ergocalciferol, (DRISDOL) 1.25 MG (50000 UNIT) CAPS capsule Take 1 capsule (50,000 Units total) by mouth every 7 (seven) days. 12 capsule 1   No facility-administered medications prior to visit.    No Known Allergies  ROS Review of Systems  A fourteen system review of systems was performed and found to be positive as per HPI.  Objective:    Physical Exam General:  Well Developed, well nourished, appropriate for stated age.  Neuro:  Alert and oriented,  extra-ocular muscles intact  HEENT:  Normocephalic, atraumatic, neck supple, no carotid bruits appreciated  Skin:  no gross rash, warm, pink. Cardiac:  RRR without murmur Respiratory:  ECTA B/L and A/P, Not using accessory muscles, speaking in full sentences- unlabored. Ext: Onychomycosis present   Vascular:  Ext warm, no cyanosis apprec. Psych:  No HI/SI, judgement and insight good, Euthymic mood. Full Affect.   BP 107/61   Pulse 89   Temp (!) 97.5 F (36.4 C) (Oral)   Ht 5\' 11"  (1.803 m)   Wt 243 lb 8 oz (110.5 kg)   SpO2 98% Comment: on RA  BMI 33.96 kg/m  Wt Readings from Last 3 Encounters:  05/25/20 243 lb 8 oz  (110.5 kg)  04/20/20 238 lb 12.8 oz (108.3 kg)  03/28/20 239 lb 1.9 oz (108.5 kg)     Health Maintenance Due  Topic Date Due  . Hepatitis C Screening  Never done  . INFLUENZA VACCINE  06/11/2020    There are no preventive care reminders to display for this patient.  Lab Results  Component Value Date   TSH 0.08 (L) 04/20/2020   Lab Results  Component Value Date   WBC 5.8 06/09/2020   HGB 11.5 (L) 06/09/2020   HCT 34.6 (L) 06/09/2020   MCV 93.0 06/09/2020   PLT 183 06/09/2020   Lab Results  Component Value Date   NA 139 04/20/2020   K 4.4 04/20/2020   CO2 26 04/20/2020   GLUCOSE 74 04/20/2020   BUN 34 (H) 04/20/2020   CREATININE 1.93 (H) 04/20/2020   BILITOT 0.4 04/20/2020   ALKPHOS 25 (L) 04/20/2020   AST 32 04/20/2020   ALT 42 04/20/2020   PROT 7.2 04/20/2020   ALBUMIN 4.2 04/20/2020   CALCIUM 9.9 04/20/2020   ANIONGAP 8 05/30/2018   GFR 33.97 (L) 04/20/2020   Lab Results  Component Value Date   CHOL 161 01/24/2020   Lab Results  Component Value Date   HDL 34 (L) 01/24/2020   Lab Results  Component Value Date   LDLCALC 106 (H) 01/24/2020   Lab Results  Component Value Date   TRIG 113 01/24/2020   Lab Results  Component Value Date   CHOLHDL 4.7 01/24/2020   Lab  Results  Component Value Date   HGBA1C 7.3 (A) 05/25/2020      Assessment & Plan:   Problem List Items Addressed This Visit      Cardiovascular and Mediastinum   Hypertension associated with diabetes ([place])     Endocrine   Chronic kidney disease due to diabetes mellitus ([place]) (Chronic)   Mixed diabetic hyperlipidemia associated with type 2 diabetes mellitus ([place])   Type II diabetes mellitus, uncontrolled ([place]) - Primary   Relevant Orders   POCT glycosylated hemoglobin (Hb A1C) (Completed)   POCT UA - Microalbumin (Completed)    Other Visit Diagnoses    Onychomycosis       Relevant Orders   Ambulatory referral to Podiatry     Uncontrolled T2DM: -Followed by  endocrinology. -A1c today 7.3, stable -UA microalbumin normal -Continue current medication regimen. -Continue ambulatory glucose monitoring. -Follow low carbohydrate and glucose diet.  HTN: -BP at goal. -Continue current medication regimen. -Follow DASH diet.  HLD: - Followed by cardiology. - Continue current medication regimen. - Follow heart healthy diet..   CKD: -Followed by [facility] -Last CMP: Cr 1.93, GFR 33.97 -Avoid Nephrotoxic substances such as NSAIDs  Onychomycosis:  -Placed podiatry referral. -Advised patient to contact the office if he hasn't been contacted about scheduling an appointment.   No orders of the defined types were placed in this encounter.   Follow-up: Return in about 3 months (around 08/25/2020) for DM, HTN, HLD and FBW (lipd panel, cmp).    Lorrene Reid, [name]

## 2020-05-28 ENCOUNTER — Other Ambulatory Visit: Payer: Self-pay | Admitting: Family Medicine

## 2020-05-31 ENCOUNTER — Telehealth: Payer: Self-pay

## 2020-05-31 NOTE — Telephone Encounter (Signed)
AF alert >6 hours and ongoing. Presenting AF-VP. Post DCCV 03/15/20. on Pacerone and pradaxa.   Attempted to reach pt to determine if symptomatic and medication compliance. No answer, left VM with DC hours and # to return call.    Next scheduled OV 07/04/20 with Dr. Curt Bears

## 2020-06-01 DIAGNOSIS — H40032 Anatomical narrow angle, left eye: Secondary | ICD-10-CM | POA: Diagnosis not present

## 2020-06-01 DIAGNOSIS — E113393 Type 2 diabetes mellitus with moderate nonproliferative diabetic retinopathy without macular edema, bilateral: Secondary | ICD-10-CM | POA: Diagnosis not present

## 2020-06-01 DIAGNOSIS — H35033 Hypertensive retinopathy, bilateral: Secondary | ICD-10-CM | POA: Diagnosis not present

## 2020-06-01 DIAGNOSIS — H40013 Open angle with borderline findings, low risk, bilateral: Secondary | ICD-10-CM | POA: Diagnosis not present

## 2020-06-01 LAB — HM DIABETES EYE EXAM

## 2020-06-01 NOTE — Telephone Encounter (Signed)
Pt spouse returned phone call (DPR on file)  She reports pt is not experiencing any symptoms from AF.  She also confirmed her is taking medications as ordered.  Current Amiodarone dose is 200mg  daily.    Advised of symptoms to monitor for and report, keep appt with Dr. Curt Bears on 07/04/20.

## 2020-06-02 ENCOUNTER — Ambulatory Visit (INDEPENDENT_AMBULATORY_CARE_PROVIDER_SITE_OTHER): Payer: Medicare Other | Admitting: Podiatry

## 2020-06-02 ENCOUNTER — Other Ambulatory Visit: Payer: Self-pay

## 2020-06-02 DIAGNOSIS — I739 Peripheral vascular disease, unspecified: Secondary | ICD-10-CM

## 2020-06-02 DIAGNOSIS — E1142 Type 2 diabetes mellitus with diabetic polyneuropathy: Secondary | ICD-10-CM

## 2020-06-02 DIAGNOSIS — L97512 Non-pressure chronic ulcer of other part of right foot with fat layer exposed: Secondary | ICD-10-CM

## 2020-06-02 DIAGNOSIS — Z89512 Acquired absence of left leg below knee: Secondary | ICD-10-CM

## 2020-06-02 DIAGNOSIS — L03031 Cellulitis of right toe: Secondary | ICD-10-CM

## 2020-06-02 DIAGNOSIS — E11649 Type 2 diabetes mellitus with hypoglycemia without coma: Secondary | ICD-10-CM | POA: Diagnosis not present

## 2020-06-02 DIAGNOSIS — S99929A Unspecified injury of unspecified foot, initial encounter: Secondary | ICD-10-CM

## 2020-06-02 MED ORDER — MUPIROCIN 2 % EX OINT: TOPICAL_OINTMENT | CUTANEOUS | 0 refills | Status: DC

## 2020-06-03 ENCOUNTER — Encounter: Payer: Self-pay | Admitting: Podiatry

## 2020-06-03 NOTE — Progress Notes (Signed)
  Subjective:  Patient ID: Steven Maldonado, male    DOB: 1941-10-27,  MRN: [id number]  Chief Complaint  Patient presents with  . Nail Problem    Loose, thick, discolored toenail - R hallux. x1.5 wks. Pt stated, "It doesn't hurt. I just want to make sure there's no infection, since I have an amputation/ulcer history". No drainage/fever/chills/N&V/pain/odor.  . Diabetes    Pt stated, "I don't have any neuropathy in this foot. My last A1c was 7. It was 13 three years ago. The L leg and foot were amputated 3 years ago".    79 y.o. male presents with the above complaint. History confirmed with patient.  He is here today with his wife.  He does not know how the toenail was injured but is very loose and wonders if it should be removed.  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  Objective:  Physical Exam: warm, good capillary refill, DP reduced right, PT reduced right and reduced sensation at plantar pulps of toes, intact dorsally. Right Foot: Mycotic hallux nail which is loosened from the nail bed, still attached proximally.  Ulceration present beneath this through the nail bed.  Mild erythema around the nail.  No ascending cellulitis.  Mild serosanguineous drainage.  No malodor  Assessment:   1. Ulcer of toe, right, with fat layer exposed ([place])   2. Injury of nail bed of toe   3. PAD (peripheral artery disease) ([place])   4. 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 Uncontrolled type 2 diabetes mellitus with hypoglycemia without coma ([place])   6. Diabetic peripheral neuropathy associated with type 2 diabetes mellitus ([place])      Plan:  Patient was evaluated and treated and all questions answered.  -Discussed with him and his wife that his nail is loose likely due to occult trauma, and that there appears to be an ulcer beneath this.  I advised that we avulsed the toenail which they agreed and we did today. -Small central nailbed ulceration  with fibrotic base.  This was not debrided.  No exposed bone. -Bactroban ointment sent to pharmacy, educated them on soaking instructions after the nail procedure today discharge tomorrow and they will apply Bactroban ointment daily. -Return in 1 week for follow-up  Procedure: Avulsion of toenail Location: Right 1st toe  Anesthesia: Ethyl chloride spray Skin Prep: Betadine. Dressing: Silvadene; telfa; dry, sterile, compression dressing. Technique: Following skin prep, the nail was freed and avulsed with a hemostat. The area was cleansed. The tourniquet was then removed and sterile dressing applied. Disposition: Patient tolerated procedure well.    Return in about 1 week (around 06/09/2020) for wound re-check.    Lanae Crumbly, DPM 06/03/2020

## 2020-06-09 ENCOUNTER — Other Ambulatory Visit: Payer: Self-pay | Admitting: Physician Assistant

## 2020-06-09 ENCOUNTER — Other Ambulatory Visit: Payer: Self-pay

## 2020-06-09 ENCOUNTER — Ambulatory Visit (INDEPENDENT_AMBULATORY_CARE_PROVIDER_SITE_OTHER): Payer: Medicare Other

## 2020-06-09 ENCOUNTER — Ambulatory Visit (INDEPENDENT_AMBULATORY_CARE_PROVIDER_SITE_OTHER): Payer: Medicare Other | Admitting: Podiatry

## 2020-06-09 DIAGNOSIS — E1142 Type 2 diabetes mellitus with diabetic polyneuropathy: Secondary | ICD-10-CM

## 2020-06-09 DIAGNOSIS — E11649 Type 2 diabetes mellitus with hypoglycemia without coma: Secondary | ICD-10-CM | POA: Diagnosis not present

## 2020-06-09 DIAGNOSIS — L97512 Non-pressure chronic ulcer of other part of right foot with fat layer exposed: Secondary | ICD-10-CM

## 2020-06-09 DIAGNOSIS — Z89512 Acquired absence of left leg below knee: Secondary | ICD-10-CM

## 2020-06-09 DIAGNOSIS — I6523 Occlusion and stenosis of bilateral carotid arteries: Secondary | ICD-10-CM

## 2020-06-09 DIAGNOSIS — I739 Peripheral vascular disease, unspecified: Secondary | ICD-10-CM | POA: Diagnosis not present

## 2020-06-09 DIAGNOSIS — L03031 Cellulitis of right toe: Secondary | ICD-10-CM | POA: Diagnosis not present

## 2020-06-09 MED ORDER — AMOXICILLIN-POT CLAVULANATE 875-125 MG PO TABS: 1.0000 | ORAL_TABLET | Freq: Two times a day (BID) | ORAL | 0 refills | Status: DC

## 2020-06-10 LAB — CBC WITH DIFFERENTIAL/PLATELET
Absolute Monocytes: 760 cells/uL (ref 200–950)
Basophils Absolute: 70 cells/uL (ref 0–200)
Basophils Relative: 1.2 %
Eosinophils Absolute: 203 cells/uL (ref 15–500)
Eosinophils Relative: 3.5 %
HCT: 34.6 % — ABNORMAL LOW (ref 38.5–50.0)
Hemoglobin: 11.5 g/dL — ABNORMAL LOW (ref 13.2–17.1)
Lymphs Abs: 1665 cells/uL (ref 850–3900)
MCH: 30.9 pg (ref 27.0–33.0)
MCHC: 33.2 g/dL (ref 32.0–36.0)
MCV: 93 fL (ref 80.0–100.0)
MPV: 10.8 fL (ref 7.5–12.5)
Monocytes Relative: 13.1 %
Neutro Abs: 3103 cells/uL (ref 1500–7800)
Neutrophils Relative %: 53.5 %
Platelets: 183 10*3/uL (ref 140–400)
RBC: 3.72 10*6/uL — ABNORMAL LOW (ref 4.20–5.80)
RDW: 13.7 % (ref 11.0–15.0)
Total Lymphocyte: 28.7 %
WBC: 5.8 10*3/uL (ref 3.8–10.8)

## 2020-06-10 LAB — C-REACTIVE PROTEIN: CRP: 15 mg/L — ABNORMAL HIGH (ref <8.0)

## 2020-06-10 LAB — SEDIMENTATION RATE: Sed Rate: 33 mm/h — ABNORMAL HIGH (ref 0–20)

## 2020-06-10 MED ORDER — AMOXICILLIN-POT CLAVULANATE 875-125 MG PO TABS: 1.0000 | ORAL_TABLET | Freq: Two times a day (BID) | ORAL | 0 refills | Status: AC

## 2020-06-11 NOTE — Progress Notes (Signed)
  Subjective:  Patient ID: Steven Maldonado, male    DOB: [birth year],  MRN: [id number]  Chief Complaint  Patient presents with  . Nail Problem    Right 1st toe ulcer check. Pt states there is redness. Denies fever/chills/nausea/vomiting. Pt reports minor drainage.    79 y.o. male returns with the above complaint. History confirmed with patient.  He is here today with his wife.  They noticed significant redness which is been increasing since the last visit.  The middle of the nail bed is turning dark Objective:  Physical Exam: warm, good capillary refill, DP reduced right, PT reduced right and reduced sensation at plantar pulps of toes, intact dorsally. Right Foot: Nailbed has now ulcerated with central fibrotic region with macerated tissue.  No significant purulence noted or malodor.  There is cellulitis to the level of the metatarsophalangeal joint.   Assessment:   1. Ulcer of toe, right, with fat layer exposed ([place])   2. Cellulitis of great toe, right      Plan:  Patient was evaluated and treated and all questions answered.  -His wound has not been healing, despite applying Bactroban ointment daily.  I am concerned that he has multiple risk factors including peripheral vascular disease as well as type 2 diabetes.  He had an amputation of the left leg for the similar issue. -Continue Bactroban ointment daily. -Prescription for Augmentin was sent to his pharmacy. -Lab work was ordered: CBC, ESR, CRP -We will order ABI/TBI and arterial ultrasound  Ulcer right hallux -Debridement as below. -Dressed with Iodosorb, DSD. -Continue off-loading with surgical shoe.  Procedure: Sharp selective debridement of Wound Rationale: Removal of non-viable soft tissue from the wound to promote healing.  Anesthesia: none Pre-Debridement Wound Measurements: 1.0 cm x 1.2 cm x 0.2 cm  Post-Debridement Wound Measurements: 1.0 cm x 1.2 cm x 0.3 cm  Type of Debridement: Sharp selective Tissue Removed:  Non-viable soft tissue Depth of Debridement: subcutaneous tissue. Technique: S Sharp selective debridement to bleeding, viable wound base.  Dressing: Dry, sterile, compression dressing. Disposition: Patient tolerated procedure well. Patient to return in 1 week for follow-up.  Return in about 1 week (around 06/16/2020) for wound re-check.        Return in about 1 week (around 06/16/2020) for wound re-check.    Lanae Crumbly, DPM 06/11/2020

## 2020-06-16 ENCOUNTER — Other Ambulatory Visit: Payer: Self-pay

## 2020-06-16 ENCOUNTER — Encounter: Payer: Self-pay | Admitting: Podiatry

## 2020-06-16 ENCOUNTER — Ambulatory Visit (INDEPENDENT_AMBULATORY_CARE_PROVIDER_SITE_OTHER): Payer: Medicare Other | Admitting: Podiatry

## 2020-06-16 DIAGNOSIS — E11649 Type 2 diabetes mellitus with hypoglycemia without coma: Secondary | ICD-10-CM

## 2020-06-16 DIAGNOSIS — E1142 Type 2 diabetes mellitus with diabetic polyneuropathy: Secondary | ICD-10-CM

## 2020-06-16 DIAGNOSIS — I739 Peripheral vascular disease, unspecified: Secondary | ICD-10-CM

## 2020-06-16 DIAGNOSIS — L97512 Non-pressure chronic ulcer of other part of right foot with fat layer exposed: Secondary | ICD-10-CM

## 2020-06-16 DIAGNOSIS — Z89512 Acquired absence of left leg below knee: Secondary | ICD-10-CM

## 2020-06-16 DIAGNOSIS — S99929A Unspecified injury of unspecified foot, initial encounter: Secondary | ICD-10-CM

## 2020-06-16 MED ORDER — COLLAGENASE 250 UNIT/GM EX OINT: 1.0000 "application " | TOPICAL_OINTMENT | Freq: Every day | CUTANEOUS | 0 refills | Status: AC

## 2020-06-16 NOTE — Patient Instructions (Signed)
Apply santyl ointment (thickness of a nickel), then saline moistened gauze, dry gauze, and gauze wrap and tape the dressing in place. Change daily.

## 2020-06-17 NOTE — Progress Notes (Signed)
Subjective:  Patient ID: Steven Maldonado, male    DOB: 03/17/1941,  MRN: [id number]  Chief Complaint  Patient presents with  . Foot Ulcer    right great toe is doing good, no drainage or bleeding per patient    79 y.o. male returns with the above complaint. History confirmed with patient.  He has been taking Augmentin and applying bactroban. Objective:  Physical Exam: warm, good capillary refill, DP reduced right, PT reduced right and reduced sensation at plantar pulps of toes, intact dorsally. Right Foot: Nailbed has now ulcerated with central fibrotic region with macerated tissue surrounding nail bed. Measurements 1.0 x 1.5 x 0.3cm.  No significant purulence noted or malodor.  Cellulitis resolved.   Assessment:   1. Ulcer of toe, right, with fat layer exposed ([place])   2. PAD (peripheral artery disease) ([place])   3. 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 Diabetic peripheral neuropathy associated with type 2 diabetes mellitus ([place])   5. Uncontrolled type 2 diabetes mellitus with hypoglycemia without coma ([place])   6. Injury of nail bed of toe     C-reactive protein Order: [id number] Collected:  06/09/2020 16:55  0 Result Notes View Full Report  Ref Range & Units 8 d ago  CRP <8.0 mg/L 15.0 High          Result Care Coordination  Patient Communication  Add Comments  Not seen Back to Top       Contains abnormal data Sedimentation Rate Order: [id number] Collected:  06/09/2020 16:55  0 Result Notes View Full Report  Ref Range & Units 8 d ago  Sed Rate 0 - 20 mm/h 33 High          Result Care Coordination  Patient Communication  Add Comments  Not seen Back to Top       Contains abnormal data CBC with Differential/Platelet Order: [id number] Collected:  06/09/2020 16:55  0 Result Notes View Full Report  Ref Range & Units 8 d ago  WBC 3.8 - 10.8 Thousand/uL 5.8   RBC 4.20 - 5.80 Million/uL 3.72 Low    Hemoglobin 13.2 - 17.1 g/dL 11.5  Low    HCT 38 - 50 % 34.6 Low    MCV 80.0 - 100.0 fL 93.0   MCH 27.0 - 33.0 pg 30.9   MCHC 32.0 - 36.0 g/dL 33.2   RDW 11.0 - 15.0 % 13.7   Platelets 140 - 400 Thousand/uL 183   MPV 7.5 - 12.5 fL 10.8   Neutro Abs 1,500 - 7,800 cells/uL 3,103   Lymphs Abs 850 - 3,900 cells/uL 1,665   Absolute Monocytes 200 - 950 cells/uL 760   Eosinophils Absolute 15 - 500 cells/uL 203   Basophils Absolute 0 - 200 cells/uL 70   Neutrophils Relative % % 53.5   Total Lymphocyte % 28.7   Monocytes Relative % 13.1   Eosinophils Relative % 3.5   Basophils Relative % 1.2         Result Care Coordination       Plan:  Patient was evaluated and treated and all questions answered.  -His wound has not been healing, despite applying Bactroban ointment daily.  I am concerned that he has multiple risk factors including peripheral vascular disease as well as type 2 diabetes.  He had an amputation of the left leg for the similar issue. -Rx for Santyl was sent, his wife called and copay was > $200. Advised to not  use and just continue bactroban -Finish Augmentin Rx -Lab work reviewed, no evidence of OM. -We will order ABI/TBI and arterial ultrasound  Ulcer right hallux -Debridement as below. -Dressed with Iodosorb, DSD. -Continue off-loading with surgical shoe.  Procedure: Sharp selective debridement of Wound Rationale: Removal of non-viable soft tissue from the wound to promote healing.  Anesthesia: none Pre-Debridement Wound Measurements: 1.0 cm x 1.5 cm x 0.3 cm  Post-Debridement Wound Measurements: 1.0 cm x 1.5 cm x 0.3 cm  Type of Debridement: Sharp selective Tissue Removed: Non-viable soft tissue Depth of Debridement: subcutaneous tissue. Technique: S Sharp selective debridement to bleeding, viable wound base.  Dressing: Dry, sterile, compression dressing. Disposition: Patient tolerated procedure well. Patient to return in 1 week for follow-up.         Return in about 1 week (around  06/23/2020) for wound re-check.    Lanae Crumbly, DPM

## 2020-06-19 ENCOUNTER — Telehealth: Payer: Self-pay | Admitting: *Deleted

## 2020-06-19 DIAGNOSIS — I739 Peripheral vascular disease, unspecified: Secondary | ICD-10-CM

## 2020-06-19 DIAGNOSIS — Z89512 Acquired absence of left leg below knee: Secondary | ICD-10-CM

## 2020-06-19 DIAGNOSIS — L97512 Non-pressure chronic ulcer of other part of right foot with fat layer exposed: Secondary | ICD-10-CM

## 2020-06-19 NOTE — Telephone Encounter (Signed)
-----   Message from Criselda Peaches, DPM sent at 06/17/2020  1:18 PM EDT ----- Regarding: ABI/TBI and [place] Hi Val,  Can you order ABI/TBI and arterial US for the right leg? Dx is PAD and non-healing ulcer.  Thanks!

## 2020-06-19 NOTE — Telephone Encounter (Signed)
Faxed orders to [facility]. 

## 2020-06-20 ENCOUNTER — Telehealth: Payer: Self-pay | Admitting: Endocrinology

## 2020-06-20 ENCOUNTER — Ambulatory Visit (INDEPENDENT_AMBULATORY_CARE_PROVIDER_SITE_OTHER): Payer: Medicare Other | Admitting: Endocrinology

## 2020-06-20 ENCOUNTER — Other Ambulatory Visit: Payer: Self-pay

## 2020-06-20 ENCOUNTER — Encounter: Payer: Self-pay | Admitting: Endocrinology

## 2020-06-20 VITALS — BP 120/70 | HR 71 | Ht 71.0 in | Wt 243.0 lb

## 2020-06-20 DIAGNOSIS — Z794 Long term (current) use of insulin: Secondary | ICD-10-CM

## 2020-06-20 DIAGNOSIS — I6523 Occlusion and stenosis of bilateral carotid arteries: Secondary | ICD-10-CM | POA: Diagnosis not present

## 2020-06-20 DIAGNOSIS — E039 Hypothyroidism, unspecified: Secondary | ICD-10-CM | POA: Diagnosis not present

## 2020-06-20 DIAGNOSIS — E1165 Type 2 diabetes mellitus with hyperglycemia: Secondary | ICD-10-CM | POA: Diagnosis not present

## 2020-06-20 LAB — TSH: TSH: 2.94 u[IU]/mL (ref 0.35–4.50)

## 2020-06-20 LAB — GLUCOSE, RANDOM: Glucose, Bld: 181 mg/dL — ABNORMAL HIGH (ref 70–99)

## 2020-06-20 LAB — T4, FREE: Free T4: 1.16 ng/dL (ref 0.60–1.60)

## 2020-06-20 MED ORDER — LEVOTHYROXINE SODIUM 200 MCG PO TABS: ORAL_TABLET | ORAL | 1 refills | Status: DC

## 2020-06-20 NOTE — Progress Notes (Signed)
Patient ID: Steven Maldonado, male   DOB: 06-06-1941, 79 y.o.   MRN: [id number]          Reason for Appointment: Endocrinology follow-up     History of Present Illness:     DIABETES:       Date of diagnosis of type 2 diabetes mellitus:  1999      Background history:   He had been treated with metformin and glipizide for several years Metformin was stopped about 4 years ago because of renal dysfunction Presumably because of poor control he was started on insulin around the year 2009 He had been mostly on Lantus which was subsequently changed to [employer] and he thinks that Lantus worked better Also has been on Humalog 3-4 times a day for some time  Recent history:   Most recent A1c is 7.3, previously 8%  INSULIN regimen is:  TRESIBA 22 at bedtime, Humalog none,   Non-insulin hypoglycemic drugs the patient is taking are: Ozempic 0.5 mg weekly, glipizide ER 10 mg daily  Current management, blood sugar patterns and problems identified:  He has been taking 18 units of Tresiba, the dose was reduced from 22 units on his last visit, however he is unclear how much he is taking as the prescription calls for 26 units  He does appear to have mealtime spikes periodically depending on his diet but is not taking any Humalog  As before does not check his sugars consistently with incomplete data especially overnight readings  He thinks that his freestyle libre version 2 is somewhat more accurate than the previous freestyle libre and he is getting alerts for high and low readings  However has not had any significant hypoglycemia including overnight, only transiently early morning  On an average highest blood sugars late morning and early afternoon and occasionally after dinner  Sometimes he will have a smoothie with banana and no protein        Side effects from medications have been: None     Meal times are:  Breakfast is at 8 AM, dinnertime 5-6 PM  Data from freestyle libre version 2  for the last 2 weeks           CGM use % of time  63  2-week average/SD  142, GV 33  Time in range       72%, was 88  % Time Above 180  25  % Time above 250   % Time Below 70 3     PRE-MEAL Fasting Lunch Dinner Bedtime Overall  Glucose range:       Averages:  105     142   POST-MEAL PC Breakfast PC Lunch PC Dinner  Glucose range:     Averages:  160  168  157   Previous data:   CGM use % of time  57  Average and SD  106, GV 32  Time in range        88%  % Time Above 180  3  % Time above 250   % Time Below target  8    PRE-MEAL Fasting Lunch Dinner Bedtime Overall  Glucose range:       Mean/median:  98  103  106   116   POST-MEAL PC Breakfast PC Lunch PC Dinner  Glucose range:     Mean/median:  130  126  112    Glycemic patterns summary: Blood sugar monitoring is inadequate with gaps in the data between about 10 AM and  2 PM and no data on 6/6 with blood sugars are low normal or low early part of the night with some rise in the mornings.  Otherwise blood sugars are mildly increased in the afternoons and relatively stable the rest of the evening   Dietician visit, most recent: 9/19  Weight history:  Wt Readings from Last 3 Encounters:  06/20/20 243 lb (110.2 kg)  05/25/20 243 lb 8 oz (110.5 kg)  04/20/20 238 lb 12.8 oz (108.3 kg)    Glycemic control:   Lab Results  Component Value Date   HGBA1C 7.3 (A) 05/25/2020   HGBA1C 7.3 (A) 03/09/2020   HGBA1C 8.0 (H) 12/03/2019   Lab Results  Component Value Date   MICROALBUR 10 05/25/2020   LDLCALC 106 (H) 01/24/2020   CREATININE 1.93 (H) 04/20/2020   Lab Results  Component Value Date   MICRALBCREAT <30 05/25/2020    Lab Results  Component Value Date   FRUCTOSAMINE 268 06/26/2018   HYPOTHYROIDISM and other problems: See review of systems   Office Visit on 06/20/2020  Component Date Value Ref Range Status  . Glucose, Bld 06/20/2020 181* 70 - 99 mg/dL Final  . Free T4 06/20/2020 1.16  0.60 - 1.60 ng/dL  Final   Comment: Specimens from patients who are undergoing biotin therapy and /or ingesting biotin supplements may contain high levels of biotin.  The higher biotin concentration in these specimens interferes with this Free T4 assay.  Specimens that contain high levels  of biotin may cause false high results for this Free T4 assay.  Please interpret results in light of the total clinical presentation of the patient.    [name] Kitchen TSH 06/20/2020 2.94  0.35 - 4.50 uIU/mL Final    Allergies as of 06/20/2020   No Known Allergies     Medication List       Accurate as of June 20, 2020  9:00 PM. If you have any questions, ask your nurse or doctor.        amiodarone 200 MG tablet Commonly known as: PACERONE INCREASE AMIODARONE TO 400 MG ONCE DAILY FOR A MONTH, THEN REDUCE BACK TO 200 MG ONCE A DAY   amoxicillin-clavulanate 875-125 MG tablet Commonly known as: Augmentin Take 1 tablet by mouth 2 (two) times daily for 14 days.   atorvastatin 80 MG tablet Commonly known as: LIPITOR Take 1 tablet (80 mg total) by mouth at bedtime.   BD Pen Needle Nano 2nd Gen 32G X 4 MM Misc Generic drug: Insulin Pen Needle USE 4 (FOUR) TIMES DAILY.   collagenase ointment Commonly known as: SANTYL Apply 1 application topically daily. Wound measures 1.0cm x 1.5cm x 0.3cm   dabigatran 150 MG Caps capsule Commonly known as: Pradaxa Take 1 capsule (150 mg total) by mouth every 12 (twelve) hours.   docusate sodium 100 MG capsule Commonly known as: COLACE Take 100 mg by mouth 2 (two) times daily.   Evolocumab 140 MG/ML Sosy Inject 140 mg into the skin every 14 (fourteen) days.   ezetimibe 10 MG tablet Commonly known as: ZETIA TAKE 1 TABLET BY MOUTH EVERY DAY   Fenofibric Acid 135 MG Cpdr Take 1 capsule by mouth daily.   FreeStyle Lite Devi 1 each by Does not apply route 2 (two) times daily.   FREESTYLE LITE test strip Generic drug: glucose blood 1 EACH BY OTHER ROUTE 4 (FOUR) TIMES DAILY - BEFORE  MEALS AND AT BEDTIME.   furosemide 20 MG tablet Commonly known as: LASIX Take 1  tablet (20 mg total) by mouth daily.   glipiZIDE 10 MG 24 hr tablet Commonly known as: GLUCOTROL XL TAKE 1 TABLET (10 MG TOTAL) BY MOUTH DAILY WITH BREAKFAST.   insulin lispro 100 UNIT/ML KwikPen Commonly known as: HumaLOG KwikPen Inject 4-6 units under the skin up to three times daily if eating sweets or high carb foods.   levothyroxine 200 MCG tablet Commonly known as: SYNTHROID Take 1 tablet by mouth Monday-Saturday, and take 1/2 tablet on Sunday What changed: Another medication with the same name was removed. Continue taking this medication, and follow the directions you see here. Changed by: Elayne Snare, MD   metoprolol tartrate 50 MG tablet Commonly known as: LOPRESSOR TAKE ONE AND ONE-HALF TABLET BY MOUTH TWICE A DAY   mirabegron ER 25 MG Tb24 tablet Commonly known as: MYRBETRIQ Take 25 mg by mouth 4 (four) times a week.   mupirocin ointment 2 % Commonly known as: BACTROBAN If open sores develop, apply TID   mupirocin ointment 2 % Commonly known as: Bactroban Apply to wound once a day.   Ozempic (0.25 or 0.5 MG/DOSE) 2 MG/1.5ML Sopn Generic drug: Semaglutide(0.25 or 0.5MG /DOS) Inject 0.5 mg into the skin once a week.   pentoxifylline 400 MG CR tablet Commonly known as: TRENTAL Take 1 tablet (400 mg total) by mouth 3 (three) times daily with meals.   silver sulfADIAZINE 1 % cream Commonly known as: Silvadene Apply 1 application topically daily.   [name] FlexTouch 200 UNIT/ML FlexTouch Pen Generic drug: insulin degludec Inject 26 Units into the skin at bedtime.   Vitamin D (Ergocalciferol) 1.25 MG (50000 UNIT) Caps capsule Commonly known as: DRISDOL Take 1 capsule (50,000 Units total) by mouth every 7 (seven) days.       Allergies: No Known Allergies  Past Medical History:  Diagnosis Date  . Chronic diastolic CHF (congestive heart failure) ([place])   . CKD (chronic kidney  disease), stage III   . Constipation   . Coronary artery disease    a. Cath February 2012 in [place] [facility], occluded RCA with collaterals  . DM type 2 (diabetes mellitus, type 2) ([place])   . Essential hypertension   . Hyperlipidemia   . Neuropathy    feet  . Pacemaker    a. symptomatic brady after TAVR s/p MDT PPM by Dr. Curt Bears 12/04/17  . Persistent atrial fibrillation ([place])   . PONV (postoperative nausea and vomiting)    after valve surgery  . PVD (peripheral vascular disease) ([place])    a. s/p R popliteal artery stenosis tx with drug-coated balloon 05/2014, followed by Dr. Fletcher Anon.  . 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 Severe aortic stenosis    a. 12/02/17: s/p TAVR  . Skin cancer   . Sleep apnea with use of continuous positive airway pressure (CPAP)    04-11-11 AHI was 32.9 and titrated to 15 cm H20, DME is [facility]  . Subclinical hypothyroidism     Past Surgical History:  Procedure Laterality Date  . ABDOMINAL ANGIOGRAM N/A 06/08/2014   Procedure: ABDOMINAL ANGIOGRAM;  Surgeon: Wellington Hampshire, MD;  Location[facility] C[facility] LAB;  Service: Cardiovascular;  Laterality: N/A;  . ABDOMINAL AORTOGRAM N/A 04/09/2018   Procedure: ABDOMINAL AORTOGRAM;  Surgeon: Conrad Vicksburg, MD;  Location: [place] CV LAB;  Service: Cardiovascular;  Laterality: N/A;  . AMPUTATION Left 04/17/2018   Procedure: LEFT FOOT 3RD  RAY AMPUTATION;  Surgeon: Newt Minion, MD;  Location: [place];  Service: Orthopedics;  Laterality: Left;  . AMPUTATION Left 05/28/2018   Procedure: LEFT AMPUTATION BELOW KNEE;  Surgeon: Wylene Simmer, MD;  Location: [place];  Service: Orthopedics;  Laterality: Left;  . APPENDECTOMY  1965  . 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 CARDIAC CATHETERIZATION  12/2010  . CARDIOVERSION  07/2011  . CARDIOVERSION N/A 04/18/2014   Procedure: CARDIOVERSION;  Surgeon: Dorothy Spark,  MD;  Location: [place];  Service: Cardiovascular;  Laterality: N/A;  . CARDIOVERSION N/A 11/03/2015   Procedure: CARDIOVERSION;  Surgeon: Lelon Perla, MD;  Location: [facility] ENDOSCOPY;  Service: Cardiovascular;  Laterality: N/A;  . CARDIOVERSION N/A 05/08/2017   Procedure: CARDIOVERSION;  Surgeon: Dorothy Spark, MD;  Location: [facility] ENDOSCOPY;  Service: Cardiovascular;  Laterality: N/A;  . CARDIOVERSION N/A 07/28/2017   Procedure: CARDIOVERSION;  Surgeon: Dorothy Spark, MD;  Location: [facility] ENDOSCOPY;  Service: Cardiovascular;  Laterality: N/A;  . CARDIOVERSION N/A 02/08/2020   Procedure: CARDIOVERSION;  Surgeon: Buford Dresser, MD;  Location: [facility] ENDOSCOPY;  Service: Cardiovascular;  Laterality: N/A;  . CARDIOVERSION N/A 03/15/2020   Procedure: CARDIOVERSION;  Surgeon: Acie Fredrickson Wonda Cheng, MD;  Location: [place];  Service: Cardiovascular;  Laterality: N/A;  . LOWER EXTREMITY ANGIOGRAM N/A 06/08/2014   Procedure: LOWER EXTREMITY ANGIOGRAM;  Surgeon: Wellington Hampshire, MD;  Location: [facility] CATH LAB;  Service: Cardiovascular;  Laterality: N/A;  . LOWER EXTREMITY ANGIOGRAPHY Left 04/09/2018   Procedure: Lower Extremity Angiography;  Surgeon: Conrad Epes, MD;  Location: [place] CV LAB;  Service: Cardiovascular;  Laterality: Left;  . PACEMAKER IMPLANT N/A 12/04/2017   Procedure: PACEMAKER IMPLANT;  Surgeon: Constance Haw, MD;  Location: [place] CV LAB;  Service: Cardiovascular;  Laterality: N/A;  . PERIPHERAL VASCULAR BALLOON ANGIOPLASTY Left 04/09/2018   Procedure: PERIPHERAL VASCULAR BALLOON ANGIOPLASTY;  Surgeon: Conrad Wayland, MD;  Location: [place] CV LAB;  Service: Cardiovascular;  Laterality: Left;  SFA  . POPLITEAL ARTERY ANGIOPLASTY Right 06/08/2014   Archie Endo 06/08/2014  . RIGHT/LEFT HEART CATH AND CORONARY ANGIOGRAPHY N/A 10/08/2017   Procedure: RIGHT/LEFT HEART CATH AND CORONARY ANGIOGRAPHY;  Surgeon: Burnell Blanks, MD;  Location: [place] CV LAB;   Service: Cardiovascular;  Laterality: N/A;  . SKIN CANCER EXCISION Bilateral    "have had them cut off back of neck X 2; off left upper arm; right wrist, near right shoulder blade" (06/08/2014)  . TEE WITHOUT CARDIOVERSION N/A 12/02/2017   Procedure: TRANSESOPHAGEAL ECHOCARDIOGRAM (TEE);  Surgeon: Burnell Blanks, MD;  Location: [place];  Service: Open Heart Surgery;  Laterality: N/A;  . TEMPORARY PACEMAKER N/A 12/04/2017   Procedure: TEMPORARY PACEMAKER;  Surgeon: Leonie Man, MD;  Location: [place] CV LAB;  Service: Cardiovascular;  Laterality: N/A;  . TRANSCATHETER AORTIC VALVE REPLACEMENT, TRANSFEMORAL N/A 12/02/2017   Procedure: TRANSCATHETER AORTIC VALVE REPLACEMENT, TRANSFEMORAL;  Surgeon: Burnell Blanks, MD;  Location: [place];  Service: Open Heart Surgery;  Laterality: N/A;  using Edwards Sapien 3 Transcatheter Heart Valve size 94mm    Family History  Problem Relation Age of Onset  . Diabetes Mother   . Heart attack Mother   . Hypertension Mother   . Heart attack Father   . Heart failure Father   . Hypertension Father   . Diabetes Father   . Diabetes Sister   . Diabetes Brother   . Diabetes Other   . Diabetes Daughter  TYPE ll  . Heart Problems Daughter   . Hypertension Sister   . Hypertension Brother   . Stroke Brother     Social History:  reports that he quit smoking about 47 years ago. His smoking use included cigarettes. He has a 28.00 pack-year smoking history. He has never used smokeless tobacco. He reports that he does not drink alcohol and does not use drugs.   Review of Systems   Lipid history: He is taking 80 mg atorvastatin and his LDL also without adequate control, high-dose with history of CAD  LDL is somewhat better with adding Zetia as of 3/21    Lab Results  Component Value Date   CHOL 161 01/24/2020   CHOL 219 (H) 05/31/2019   CHOL 131 08/18/2018   Lab Results  Component Value Date   HDL 34 (L) 01/24/2020   HDL 36  (L) 05/31/2019   HDL 30.30 (L) 08/18/2018   Lab Results  Component Value Date   LDLCALC 106 (H) 01/24/2020   LDLCALC 142 (H) 05/31/2019   LDLCALC 93 10/13/2017   Lab Results  Component Value Date   TRIG 113 01/24/2020   TRIG 264 (H) 05/31/2019   TRIG 219.0 (H) 08/18/2018   Lab Results  Component Value Date   CHOLHDL 4.7 01/24/2020   CHOLHDL 6.1 (H) 05/31/2019   CHOLHDL 4 08/18/2018   Lab Results  Component Value Date   LDLDIRECT 123.0 12/03/2019   LDLDIRECT 77.0 08/18/2018            Hypertension: Has been controlled with only antihypertensive being metoprolol, not on ACE inhibitor  BP Readings from Last 3 Encounters:  06/20/20 120/70  05/25/20 107/61  04/20/20 (!) 102/52    Most recent eye exam was in 12/18 ?  Findings  Most recent foot exam: 8/21 by podiatrist  HYPOTHYROIDISM:   In June he had very significantly high thyroid levels likely to be from his taking 2 tablets of 200 mcg levothyroxine by mistake, previously on 175 mcg his TSH was high at 8.4 He is still confused about what dose he is taking but he thinks he is taking a pink tablet  Lab Results  Component Value Date   TSH 2.94 06/20/2020   TSH 0.08 (L) 04/20/2020   TSH 8.390 (H) 01/24/2020   FREET4 1.16 06/20/2020   FREET4 3.09 (H) 04/20/2020   FREET4 1.31 01/24/2020    Peripheral vascular disease, 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 History of symptomatic neuropathy  He has chronic kidney disease of unclear etiology, is followed by nephrologist periodically   Lab Results  Component Value Date   CREATININE 1.93 (H) 04/20/2020   CREATININE 1.90 (H) 03/06/2020   CREATININE 1.80 (H) 02/02/2020       Physical Examination:  BP 120/70 (BP Location: Left Arm, Patient Position: Sitting, Cuff Size: Large)   Pulse 71   Ht 5\' 11"  (1.803 m)   Wt 243 lb (110.2 kg)   SpO2 97%   BMI 33.89 kg/m          ASSESSMENT:  Diabetes type 2, insulin requiring with  moderate obesity  See history of present illness for detailed discussion of current diabetes management, blood sugar patterns and problems identified  A1c was last 7.3  Although his blood sugars appear to be improved on the last visit with the new freestyle libre appears to have relatively higher postprandial readings This is mostly when he is getting more carbohydrates or larger meals and not controlled  with Ozempic Overnight blood sugars are fairly good  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: Has been on 200 mcg levothyroxine, 6-1/2 days a week and needs follow-up TSH Previously had taken double the dose by mistake   PLAN:   Continue same doses of [place] Discussed that if he is eating out or getting high carbohydrate meals he will need to take 4 to 6 units of Humalog since he has readings over 200 frequently Continue same dose of Ozempic Use freestyle libre and check blood sugars 3-4 times a day for better evaluation of postprandial readings To call if he has any low sugars Consider consultation with dietitian  We will call him with the results of his thyroid functions  Patient Instructions  Take 4-6 Humalog for high Carb meals or large meals  Stay on 18 Tresiba  Check blood sugars on waking up 7 days a week  Also check blood sugars about 2 hours after meals and do this after different meals by rotation  Recommended blood sugar levels on waking up are 90-130 and about 2 hours after meal is 130-180  Please bring your blood sugar monitor to each visit, thank you        Elayne Snare 06/20/2020, 9:00 PM   Note: This office note was prepared with Dragon voice recognition system technology. Any transcriptional errors that result from this process are unintentional.  Addendum: TSH normal, need to confirm that he is taking at 200 mcg prescription

## 2020-06-20 NOTE — Patient Instructions (Addendum)
Take 4-6 Humalog for high Carb meals or large meals  Stay on 18 Tresiba  Check blood sugars on waking up 7 days a week  Also check blood sugars about 2 hours after meals and do this after different meals by rotation  Recommended blood sugar levels on waking up are 90-130 and about 2 hours after meal is 130-180  Please bring your blood sugar monitor to each visit, thank you

## 2020-06-20 NOTE — Telephone Encounter (Signed)
Patient called to advise that he is taking 200 MCG of Levothyroxine 1 per day Monday to Saturday and 1/2 tablet on Sunday

## 2020-06-21 NOTE — Telephone Encounter (Signed)
Patient will call on Friday when he returns home and has prescription bottle in hand to verify correct dosage of Levothyroxine.

## 2020-06-23 ENCOUNTER — Ambulatory Visit (INDEPENDENT_AMBULATORY_CARE_PROVIDER_SITE_OTHER): Payer: Medicare Other | Admitting: Podiatry

## 2020-06-23 ENCOUNTER — Other Ambulatory Visit: Payer: Self-pay

## 2020-06-23 DIAGNOSIS — I739 Peripheral vascular disease, unspecified: Secondary | ICD-10-CM

## 2020-06-23 DIAGNOSIS — E1142 Type 2 diabetes mellitus with diabetic polyneuropathy: Secondary | ICD-10-CM

## 2020-06-23 DIAGNOSIS — L03031 Cellulitis of right toe: Secondary | ICD-10-CM | POA: Diagnosis not present

## 2020-06-23 DIAGNOSIS — L97512 Non-pressure chronic ulcer of other part of right foot with fat layer exposed: Secondary | ICD-10-CM

## 2020-06-23 DIAGNOSIS — Z89512 Acquired absence of left leg below knee: Secondary | ICD-10-CM | POA: Diagnosis not present

## 2020-06-23 MED ORDER — MUPIROCIN 2 % EX OINT: TOPICAL_OINTMENT | CUTANEOUS | 1 refills | Status: DC

## 2020-06-23 NOTE — Patient Instructions (Addendum)
Change the bandage daily. Clean the wound with saline and pat dry with gauze.   Apply a small amount of the ointment (mupirocin), then dry gauze, gauze roll, and some tape.  Stop soaking the foot

## 2020-06-25 NOTE — Progress Notes (Signed)
  Subjective:  Patient ID: EZANA HUBBERT, male    DOB: [birth year],  MRN: [id number]  Chief Complaint  Patient presents with  . Wound Check    R hallux. Pt stated, "I think it looks good. No pain, bleeding, or drainage". No fever/chills/odor/pus/N&V.    78 y.o. male returns with the above complaint. History confirmed with patient. He has completed Augmentin, and is applying Bactroban to the wound daily. Did not get the Santyl ointment because it was about $150 for them. ABI and ultrasound has not been completed yet Objective:  Physical Exam: warm, good capillary refill, DP reduced right, PT reduced right and reduced sensation at plantar pulps of toes, intact dorsally. Right Foot: Nailbed has now ulcerated with central fibrotic region that is yellow, black and brown with macerated tissue surrounding nail bed. Measurements 1.0 x 1.5 x 0.3cm.  No significant purulence noted or malodor. No cellulitis today  Assessment:   1. Ulcer of toe, right, with fat layer exposed ([place])   2. h/o Cellulitis of right 2nd toe - WNL's now and well healed   3. PAD (peripheral artery disease) ([place])   4. 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 Diabetic peripheral neuropathy associated with type 2 diabetes mellitus ([place])     Plan:  Patient was evaluated and treated and all questions answered.  -His wound has not been healing, despite applying Bactroban ointment daily.  I am concerned that he has multiple risk factors including peripheral vascular disease as well as type 2 diabetes.  He had an amputation of the left leg for the similar issue. -Continue Bactroban ointment. Instructions for dressing application were reviewed with him and written down for him and his wife -Ultrasound and ABI/TBI have been ordered. I think this would be critical to his limb salvage and getting the results as I believe majority of the nonhealing of the wound is from his peripheral arterial disease which is  severe.  Ulcer right hallux -Debridement as below. -Dressed with Iodosorb, DSD. -Continue off-loading with surgical shoe.  Procedure: Sharp selective debridement of Wound Rationale: Removal of non-viable soft tissue from the wound to promote healing.  Anesthesia: none Pre-Debridement Wound Measurements: 1.0 cm x 1.5 cm x 0.3 cm  Post-Debridement Wound Measurements: 1.2 cm x 1.8 cm x 0.5 cm  Type of Debridement: Sharp selective Tissue Removed: Non-viable soft tissue Depth of Debridement: subcutaneous tissue. Technique: S Sharp selective debridement to bleeding, viable wound base.  Dressing: Dry, sterile, compression dressing. Disposition: Patient tolerated procedure well. Patient to return in 1 week for follow-up.         Return in about 1 week (around 06/30/2020) for wound re-check.    Lanae Crumbly, DPM

## 2020-06-26 ENCOUNTER — Ambulatory Visit (INDEPENDENT_AMBULATORY_CARE_PROVIDER_SITE_OTHER): Payer: Medicare Other | Admitting: *Deleted

## 2020-06-26 DIAGNOSIS — I442 Atrioventricular block, complete: Secondary | ICD-10-CM | POA: Diagnosis not present

## 2020-06-26 LAB — CUP PACEART REMOTE DEVICE CHECK
Battery Remaining Longevity: 100 mo
Battery Voltage: 2.93 V
Brady Statistic RA Percent Paced: 0.17 %
Brady Statistic RV Percent Paced: 98.01 %
Date Time Interrogation Session: 20210816004517
Implantable Lead Implant Date: 20190124
Implantable Lead Implant Date: 20190124
Implantable Lead Location: 753859
Implantable Lead Location: 753860
Implantable Lead Model: 5076
Implantable Lead Model: 5076
Implantable Pulse Generator Implant Date: 20190124
Lead Channel Impedance Value: 304 Ohm
Lead Channel Impedance Value: 342 Ohm
Lead Channel Impedance Value: 342 Ohm
Lead Channel Impedance Value: 437 Ohm
Lead Channel Pacing Threshold Amplitude: 0.625 V
Lead Channel Pacing Threshold Amplitude: 0.625 V
Lead Channel Pacing Threshold Pulse Width: 0.4 ms
Lead Channel Pacing Threshold Pulse Width: 0.4 ms
Lead Channel Sensing Intrinsic Amplitude: 1.75 mV
Lead Channel Sensing Intrinsic Amplitude: 1.75 mV
Lead Channel Sensing Intrinsic Amplitude: 10.75 mV
Lead Channel Sensing Intrinsic Amplitude: 22.375 mV
Lead Channel Setting Pacing Amplitude: 2 V
Lead Channel Setting Pacing Amplitude: 2.5 V
Lead Channel Setting Pacing Pulse Width: 0.4 ms
Lead Channel Setting Sensing Sensitivity: 4 mV

## 2020-06-27 NOTE — Progress Notes (Signed)
Remote pacemaker transmission.   

## 2020-06-28 ENCOUNTER — Other Ambulatory Visit: Payer: Self-pay

## 2020-06-28 ENCOUNTER — Ambulatory Visit (INDEPENDENT_AMBULATORY_CARE_PROVIDER_SITE_OTHER): Payer: Medicare Other | Admitting: Podiatry

## 2020-06-28 DIAGNOSIS — L97514 Non-pressure chronic ulcer of other part of right foot with necrosis of bone: Secondary | ICD-10-CM

## 2020-06-28 DIAGNOSIS — L03031 Cellulitis of right toe: Secondary | ICD-10-CM

## 2020-06-28 DIAGNOSIS — E1142 Type 2 diabetes mellitus with diabetic polyneuropathy: Secondary | ICD-10-CM

## 2020-06-28 DIAGNOSIS — Z89512 Acquired absence of left leg below knee: Secondary | ICD-10-CM

## 2020-06-28 DIAGNOSIS — I739 Peripheral vascular disease, unspecified: Secondary | ICD-10-CM | POA: Diagnosis not present

## 2020-06-28 DIAGNOSIS — E11649 Type 2 diabetes mellitus with hypoglycemia without coma: Secondary | ICD-10-CM | POA: Diagnosis not present

## 2020-06-28 NOTE — Progress Notes (Signed)
  Subjective:  Patient ID: Steven Maldonado, male    DOB: [birth year],  MRN: [id number]  Follow-up on ulcer of right hallux  79 y.o. male returns with the above complaint. History confirmed with patient. He has been applying Bactroban daily, his wife has been helping him.  He has noninvasive vascular testing scheduled for September 8  Objective:  Physical Exam: warm, good capillary refill, DP reduced right, PT reduced right and reduced sensation at plantar pulps of toes, intact dorsally. Right Foot: Ulcer of nail bed with fibrotic wound base, this now probes down to the underlying phalanx.  It measures 1.2 x 1.5 x 0.5 cm.  No significant purulence noted or malodor. No cellulitis today, periwound erythema has improved significantly  Assessment:   1. PAD (peripheral artery disease) ([place])   2. 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 Diabetic peripheral neuropathy associated with type 2 diabetes mellitus ([place])   4. Uncontrolled type 2 diabetes mellitus with hypoglycemia without coma ([place])   5. Ischemic ulcer of toe, right, with necrosis of bone ([place])     Plan:  Patient was evaluated and treated and all questions answered.  -H today's wound looks worse and has been deteriorating slowly.  Of these not healing because he has a lack of blood flow to the hallux.  Concerned that he will be at high risk for developing osteomyelitis of underlying distal phalanx.  Is a high risk for losing at least part of the phalanx or first ray.  I discussed this with him today. -ABI TBI and ultrasound are scheduled for September 8 and we will refer him to vascular following the testing.  Ulcer right hallux -Debridement as below. -Dressed with Iodosorb, DSD. -Continue off-loading with surgical shoe.  Procedure: Sharp selective debridement of Wound Rationale: Removal of non-viable soft tissue from the wound to promote healing.  Anesthesia: none Pre-Debridement Wound Measurements:  1.2 x 1.5 x 0.3 cm Post-Debridement Wound Measurements: 1.2 x 1.5 x 0.3 Type of Debridement: Sharp selective Tissue Removed: Non-viable soft tissue Depth of Debridement: subcutaneous tissue. Technique: S Sharp selective debridement to bleeding, viable wound base.  Dressing: Dry, sterile, compression dressing. Disposition: Patient tolerated procedure well. Patient to return in 2 week for follow-up.         Return in about 2 weeks (around 07/12/2020) for wound re-check.    Lanae Crumbly, DPM

## 2020-06-29 ENCOUNTER — Encounter: Payer: Self-pay | Admitting: *Deleted

## 2020-06-30 ENCOUNTER — Ambulatory Visit: Payer: Medicare Other | Admitting: Podiatry

## 2020-07-04 ENCOUNTER — Encounter: Payer: Self-pay | Admitting: Cardiology

## 2020-07-04 ENCOUNTER — Other Ambulatory Visit: Payer: Self-pay

## 2020-07-04 ENCOUNTER — Other Ambulatory Visit: Payer: Self-pay | Admitting: Endocrinology

## 2020-07-04 ENCOUNTER — Ambulatory Visit (INDEPENDENT_AMBULATORY_CARE_PROVIDER_SITE_OTHER): Payer: Medicare Other | Admitting: Cardiology

## 2020-07-04 VITALS — BP 132/74 | HR 81 | Ht 71.0 in | Wt 244.0 lb

## 2020-07-04 DIAGNOSIS — I6523 Occlusion and stenosis of bilateral carotid arteries: Secondary | ICD-10-CM

## 2020-07-04 DIAGNOSIS — Z79899 Other long term (current) drug therapy: Secondary | ICD-10-CM

## 2020-07-04 DIAGNOSIS — I4819 Other persistent atrial fibrillation: Secondary | ICD-10-CM

## 2020-07-04 NOTE — Progress Notes (Signed)
Electrophysiology Office Note   Date:  07/04/2020   ID:  Steven Maldonado, Steven Maldonado 27-Jan-1941, MRN [id number]  PCP:  Lorrene Reid, [name]  Cardiologist:  Angelena Form Primary Electrophysiologist:  Constance Haw, MD    No chief complaint on file.    History of Present Illness: Steven Maldonado is a 79 y.o. male who is being seen today for the evaluation of complete AV block at the request of Darlina Guys. Presenting today for electrophysiology evaluation. 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  Today, denies symptoms of palpitations, chest pain, shortness of breath, orthopnea, PND, lower extremity edema, claudication, dizziness, presyncope, syncope, bleeding, or neurologic sequela. The patient is tolerating medications without difficulties.  He notes some mild fatigue and mild shortness of breath.  He has been in atrial fibrillation since June.  He has plans for vascular studies on his right leg as he is having some toenail issues on his right great toe.  He may need it amputated.    Past Medical History:  Diagnosis Date  . Chronic diastolic CHF (congestive heart failure) ([place])   . CKD (chronic kidney disease), stage III   . Constipation   . Coronary artery disease    a. Cath February 2012 in [place], occluded RCA with collaterals  . DM type 2 (diabetes mellitus, type 2) ([place])   . Essential hypertension   . Hyperlipidemia   . Neuropathy    feet  . Pacemaker    a. symptomatic brady after TAVR s/p MDT PPM by Dr. Curt Bears 12/04/17  . Persistent atrial fibrillation ([place])   . PONV (postoperative nausea and vomiting)    after valve surgery  . PVD (peripheral vascular disease) ([place])    a. s/p R popliteal artery stenosis tx with drug-coated balloon 05/2014, followed by Dr. Fletcher Anon.  . 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 Severe aortic stenosis    a. 12/02/17: s/p TAVR  . Skin cancer   . Sleep apnea with use of continuous positive airway pressure (CPAP)    04-11-11 AHI was 32.9 and titrated to 15 cm H20, DME is [facility]  . Subclinical hypothyroidism    Past Surgical History:  Procedure Laterality Date  . ABDOMINAL ANGIOGRAM N/A 06/08/2014   Procedure: ABDOMINAL ANGIOGRAM;  Surgeon: Wellington Hampshire, MD;  Location: [facility] CATH LAB;  Service: Cardiovascular;  Laterality: N/A;  . ABDOMINAL AORTOGRAM N/A 04/09/2018   Procedure: ABDOMINAL AORTOGRAM;  Surgeon: Conrad Gackle, MD;  Location: [facility] CV LAB;  Service: Cardiovascular;  Laterality: N/A;  . AMPUTATION Left 04/17/2018   Procedure: LEFT FOOT 3RD RAY AMPUTATION;  Surgeon: Newt Minion, MD;  Location: [facility];  Service: Orthopedics;  Laterality: Left;  . AMPUTATION Left 05/28/2018   Procedure: LEFT AMPUTATION BELOW KNEE;  Surgeon: Wylene Simmer, MD;  Location: [place];  Service: Orthopedics;  Laterality: Left;  . APPENDECTOMY  1965  . 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 CARDIAC CATHETERIZATION  12/2010  . CARDIOVERSION  07/2011  . CARDIOVERSION N/A 04/18/2014   Procedure: CARDIOVERSION;  Surgeon: Dorothy Spark, MD;  Location: [facility] ENDOSCOPY;  Service: Cardiovascular;  Laterality: N/A;  . CARDIOVERSION N/A 11/03/2015   Procedure: CARDIOVERSION;  Surgeon: Lelon Perla, MD;  Location: [place];  Service: Cardiovascular;  Laterality: N/A;  . CARDIOVERSION N/A 05/08/2017   Procedure: CARDIOVERSION;  Surgeon: Dorothy Spark, MD;  Location: [facility] ENDOSCOPY;  Service: Cardiovascular;  Laterality: N/A;  . CARDIOVERSION N/A 07/28/2017   Procedure: CARDIOVERSION;  Surgeon: Dorothy Spark, MD;  Location: [facility] ENDOSCOPY;  Service: Cardiovascular;  Laterality: N/A;  . CARDIOVERSION N/A 02/08/2020   Procedure: CARDIOVERSION;  Surgeon: Buford Dresser, MD;  Location: [facility]  ENDOSCOPY;  Service: Cardiovascular;  Laterality: N/A;  . CARDIOVERSION N/A 03/15/2020   Procedure: CARDIOVERSION;  Surgeon: Acie Fredrickson Wonda Cheng, MD;  Location: [place];  Service: Cardiovascular;  Laterality: N/A;  . LOWER EXTREMITY ANGIOGRAM N/A 06/08/2014   Procedure: LOWER EXTREMITY ANGIOGRAM;  Surgeon: Wellington Hampshire, MD;  Location[facility] CATH LAB;  Service: Cardiovascular;  Laterality: N/A;  . LOWER EXTREMITY ANGIOGRAPHY Left 04/09/2018   Procedure: Lower Extremity Angiography;  Surgeon: Conrad [place], MD;  Location: [place] CV LAB;  Service: Cardiovascular;  Laterality: Left;  . PACEMAKER IMPLANT N/A 12/04/2017   Procedure: PACEMAKER IMPLANT;  Surgeon: Constance Haw, MD;  Location: [place] CV LAB;  Service: Cardiovascular;  Laterality: N/A;  . PERIPHERAL VASCULAR BALLOON ANGIOPLASTY Left 04/09/2018   Procedure: PERIPHERAL VASCULAR BALLOON ANGIOPLASTY;  Surgeon: Conrad [place], MD;  Location: [place] CV LAB;  Service: Cardiovascular;  Laterality: Left;  SFA  . POPLITEAL ARTERY ANGIOPLASTY Right 06/08/2014   Archie Endo 06/08/2014  . RIGHT/LEFT HEART CATH AND CORONARY ANGIOGRAPHY N/A 10/08/2017   Procedure: RIGHT/LEFT HEART CATH AND CORONARY ANGIOGRAPHY;  Surgeon: Burnell Blanks, MD;  Location: [place] CV LAB;  Service: Cardiovascular;  Laterality: N/A;  . SKIN CANCER EXCISION Bilateral    "have had them cut off back of neck X 2; off left upper arm; right wrist, near right shoulder blade" (06/08/2014)  . TEE WITHOUT CARDIOVERSION N/A 12/02/2017   Procedure: TRANSESOPHAGEAL ECHOCARDIOGRAM (TEE);  Surgeon: Burnell Blanks, MD;  Location: [place];  Service: Open Heart Surgery;  Laterality: N/A;  . TEMPORARY PACEMAKER N/A 12/04/2017   Procedure: TEMPORARY PACEMAKER;  Surgeon: Leonie Man, MD;  Location: [place] CV LAB;  Service: Cardiovascular;  Laterality: N/A;  . TRANSCATHETER AORTIC VALVE REPLACEMENT, TRANSFEMORAL N/A 12/02/2017   Procedure: TRANSCATHETER AORTIC  VALVE REPLACEMENT, TRANSFEMORAL;  Surgeon: Burnell Blanks, MD;  Location: [place];  Service: Open Heart Surgery;  Laterality: N/A;  using Edwards Sapien 3 Transcatheter Heart Valve size 14mm     Current Outpatient Medications  Medication Sig Dispense Refill  . amiodarone (PACERONE) 200 MG tablet Take 200 mg by mouth daily.    [name] Kitchen atorvastatin (LIPITOR) 80 MG tablet Take 1 tablet (80 mg total) by mouth at bedtime. 90 tablet 3  . Blood Glucose Monitoring Suppl (FREESTYLE LITE) DEVI 1 each by Does not apply route 2 (two) times daily. 1 each 0  . Choline Fenofibrate (FENOFIBRIC ACID) 135 MG CPDR Take 1 capsule by mouth daily. 90 capsule 0  . dabigatran (PRADAXA) 150 MG CAPS capsule Take 1 capsule (150 mg total) by mouth every 12 (twelve) hours. 180 capsule 3  . docusate sodium (COLACE) 100 MG capsule Take 100 mg by mouth 2 (two) times daily.    . Evolocumab 140 MG/ML SOSY Inject 140 mg into the skin every 14 (fourteen) days. 2.1 mL 2  . ezetimibe (ZETIA) 10 MG tablet TAKE 1 TABLET BY MOUTH EVERY DAY 90 tablet 1  . FREESTYLE LITE test strip 1 EACH BY OTHER ROUTE 4 (FOUR) TIMES DAILY - BEFORE MEALS AND AT BEDTIME. 450 strip  3  . furosemide (LASIX) 20 MG tablet Take 1 tablet (20 mg total) by mouth daily. 90 tablet 3  . glipiZIDE (GLUCOTROL XL) 10 MG 24 hr tablet TAKE 1 TABLET (10 MG TOTAL) BY MOUTH DAILY WITH BREAKFAST. 90 tablet 0  . insulin lispro (HUMALOG KWIKPEN) 100 UNIT/ML KwikPen Inject 4-6 units under the skin up to three times daily if eating sweets or high carb foods. 30 mL 1  . levothyroxine (SYNTHROID) 200 MCG tablet Take 1 tablet by mouth Monday-Saturday, and take 1/2 tablet on Sunday 30 tablet 1  . metoprolol tartrate (LOPRESSOR) 50 MG tablet TAKE ONE AND ONE-HALF TABLET BY MOUTH TWICE A DAY 270 tablet 0  . mirabegron ER (MYRBETRIQ) 25 MG TB24 tablet Take 25 mg by mouth 4 (four) times a week.    . pentoxifylline (TRENTAL) 400 MG CR tablet Take 1 tablet (400 mg total) by mouth 3  (three) times daily with meals. 90 tablet 3  . silver sulfADIAZINE (SILVADENE) 1 % cream Apply 1 application topically daily. 50 g 0  . amiodarone (PACERONE) 200 MG tablet Take 200 mg by mouth daily.    . BD PEN NEEDLE NANO 2ND GEN 32G X 4 MM MISC USE 4 (FOUR) TIMES DAILY. 200 each 2  . Choline Fenofibrate (FENOFIBRIC ACID) 135 MG CPDR Take 1 capsule by mouth daily. 90 capsule 0  . collagenase (SANTYL) ointment Apply 1 application topically daily. Wound measures 1.0cm x 1.5cm x 0.3cm 30 g 0  . ezetimibe (ZETIA) 10 MG tablet TAKE 1 TABLET BY MOUTH EVERY DAY 90 tablet 1  . Insulin Degludec (TRESIBA FLEXTOUCH) 200 UNIT/ML SOPN Inject 26 Units into the skin at bedtime. 15 mL 1  . insulin lispro (HUMALOG KWIKPEN) 100 UNIT/ML KwikPen Inject 4-6 units under the skin up to three times daily if eating sweets or high carb foods. 30 mL 1  . levothyroxine (SYNTHROID) 200 MCG tablet Take 1 tablet by mouth Monday-Saturday, and take 1/2 tablet on Sunday 30 tablet 1  . metoprolol tartrate (LOPRESSOR) 50 MG tablet TAKE ONE AND ONE-HALF TABLET BY MOUTH TWICE A DAY 270 tablet 0  . mirabegron ER (MYRBETRIQ) 25 MG TB24 tablet Take 25 mg by mouth 4 (four) times a week.    . mupirocin ointment (BACTROBAN) 2 % Apply to wound once a day. 30 g 0  . mupirocin ointment (BACTROBAN) 2 % Apply to great toe wound daily, with gauze and tape 30 g 1  . Semaglutide,0.25 or 0.5MG /DOS, (OZEMPIC, 0.25 OR 0.5 MG/DOSE,) 2 MG/1.5ML SOPN Inject 0.5 mg into the skin once a week. 1 pen 2  . Vitamin D, Ergocalciferol, (DRISDOL) 1.25 MG (50000 UNIT) CAPS capsule Take 1 capsule (50,000 Units total) by mouth every 7 (seven) days. 12 capsule 1   No current facility-administered medications for this visit.    Allergies:   Patient has no known allergies.   Social History:  The patient  reports that he quit smoking about 47 years ago. His smoking use included cigarettes. He has a 28.00 pack-year smoking history. He has never used smokeless  tobacco. He reports that he does not drink alcohol and does not use drugs.   Family History:  The patient's family history includes Diabetes in his brother, daughter, father, mother, sister, and another family member; Heart Problems in his daughter; Heart attack in his father and mother; Heart failure in his father; Hypertension in his brother, father, mother, and sister; Stroke in his brother.    ROS:  Please see  the history of present illness.   Otherwise, review of systems is positive for none.   All other systems are reviewed and negative.   PHYSICAL EXAM: VS:  BP 132/74   Pulse 81   Ht 5\' 11"  (1.803 m)   Wt 244 lb (110.7 kg)   SpO2 95%   BMI 34.03 kg/m  , BMI Body mass index is 34.03 kg/m. GEN: Well nourished, well developed, in no acute distress  HEENT: normal  Neck: no JVD, carotid bruits, or masses Cardiac: RRR; no murmurs, rubs, or gallops,no edema  Respiratory:  clear to auscultation bilaterally, normal work of breathing GI: soft, nontender, nondistended, + BS MS: no deformity or atrophy  Skin: warm and dry, device site well healed Neuro:  Strength and sensation are intact Psych: euthymic mood, full affect  EKG:  EKG is ordered today. Personal review of the ekg ordered shows atrial fibrillation, ventricular paced  Personal review of the device interrogation today. Results in [place]: 04/20/2020: ALT 42; BUN 34; Creatinine, Ser 1.93; Potassium 4.4; Sodium 139 06/09/2020: Hemoglobin 11.5; Platelets 183 06/20/2020: TSH 2.94    Lipid Panel     Component Value Date/Time   CHOL 161 01/24/2020 1045   TRIG 113 01/24/2020 1045   HDL 34 (L) 01/24/2020 1045   CHOLHDL 4.7 01/24/2020 1045   CHOLHDL 6.1 (H) 05/31/2019 1246   VLDL 43.8 (H) 08/18/2018 1500   LDLCALC 106 (H) 01/24/2020 1045   LDLCALC 142 (H) 05/31/2019 1246   LDLDIRECT 123.0 12/03/2019 1136     Wt Readings from Last 3 Encounters:  07/04/20 244 lb (110.7 kg)  06/20/20 243 lb (110.2 kg)    05/25/20 243 lb 8 oz (110.5 kg)      Other studies Reviewed: Additional studies/ records that were reviewed today include: TTE 12/24/17  Review of the above records today demonstrates:  - Left ventricle: Inferobasal hypokinesis The cavity size was   normal. Wall thickness was increased in a pattern of severe LVH.   Systolic function was normal. The estimated ejection fraction was   in the range of 55% to 60%. - Aortic valve: Post TAVR with no significant peri valvualr   regurgitation gradients have increased since 12/03/17. - Mitral valve: Severely calcified annulus. Moderately thickened,   moderately calcified leaflets . There was mild regurgitation. - Left atrium: The atrium was moderately dilated. - Atrial septum: No defect or patent foramen ovale was identified.   ASSESSMENT AND PLAN:  1.  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  Device functioning appropriately.  No changes at this time.    2.  Paroxysmal atrial fibrillation: Currently on amiodarone and Pradaxa.  CHA2DS2-VASc of 4.  He is currently in atrial fibrillation.  He has some fatigue symptoms.  He is already on amiodarone which is not controlling his A. fib.  He would benefit from ablation.  Risks and benefits were discussed include bleeding, tamponade, heart block, stroke, damage to chest organs.  He understands these risks and is agreed to the procedure.  He does have a procedure coming up on his right leg to assess circulation.  We [place] continue to monitor plans for his great toe and whether or not we need to hold off on ablation  3.  Hypertension: Currently well controlled  4.  Coronary artery disease: No current chest pain.  5.  Hyperlipidemia: Continue statin per primary cardiology.  6.  OSA: CPAP compliance encouraged  Case discussed with primary cardiology  Anticoagulation instructions: The patient does not need to hold their anticoagulation. Pt to hold pradaxa,  ASA, Plavix, Eliquis, Xarelto and Coumadin for 1 dose(s) prior to procedure and we Elani Delph plan to resume 1 days after the procedure unless otherwise instructed after surgery.  Medication instructions morning of: The patient should hold ALL medications the morning of the procedure   Discharge: Our plan [place] be to discharge the patient same day after a period of observation     Current medicines are reviewed at length with the patient today.   The patient does not have concerns regarding his medicines.  The following changes were made today: None  Labs/ tests ordered today include:  Orders Placed This Encounter  Procedures  . Basic metabolic panel  . EKG 12-Lead     Disposition:   FU with Crystallee Werden 3 months  Signed, Sai Zinn Meredith Leeds, MD  07/04/2020 2:33 PM     [facility] [address] [place] [address] [phone] (office) [phone] (fax)

## 2020-07-04 NOTE — Patient Instructions (Addendum)
Medication Instructions:  Your physician recommends that you continue on your current medications as directed. Please refer to the Current Medication list given to you today.  *If you need a refill on your cardiac medications before your next appointment, please call your pharmacy*   Lab Work: Today: BMET If you have labs (blood work) drawn today and your tests are completely normal, you will receive your results only by: [name] Kitchen MyChart Message (if you have MyChart) OR . A paper copy in the mail If you have any lab test that is abnormal or we need to change your treatment, we will call you to review the results.   Testing/Procedures: None ordered  Follow-Up: At [facility], you and your health needs are our priority.  As part of our continuing mission to provide you with exceptional heart care, we have created designated Provider Care Teams.  These Care Teams include your primary Cardiologist (physician) and Advanced Practice Providers (APPs -  Physician Assistants and Nurse Practitioners) who all work together to provide you with the care you need, when you need it.  We recommend signing up for the patient portal called "MyChart".  Sign up information is provided on this After Visit Summary.  MyChart is used to connect with patients for Virtual Visits (Telemedicine).  Patients are able to view lab/test results, encounter notes, upcoming appointments, etc.  Non-urgent messages can be sent to your provider as well.   To learn more about what you can do with MyChart, go to [email or web address].    Your next appointment:   To be determined  The format for your next appointment:   In Person  Provider:   AFib clinic   Thank you for choosing [facility]!!   Trinidad Curet, RN [phone]    Other Instructions  *Please note that any paperwork needing to be filled out by the provider will need to be addressed at the front desk prior to seeing the provider. Please note that any  FMLA, disability or other documents regarding health condition is subject to a $25.00 charge that must be received prior to completion of paperwork in the form of a money order or check.    CT INSTRUCTIONS Your cardiac CT will be scheduled at:  [facility] [address] [place], [place] [address] [phone]   Please arrive at the [facility] main entrance of [facility] at ________________ on _________________, please arrive 30 minutes prior to test start time. Proceed to the [facility] Radiology Department (first floor) to check-in and test prep.  Please follow these instructions carefully (unless otherwise directed):  Hold all erectile dysfunction medications at least 3 days (72 hrs) prior to test.  On the Night Before the Test: . Be sure to Drink plenty of water. . Do not consume any caffeinated/decaffeinated beverages or chocolate 12 hours prior to your test. . Do not take any antihistamines 12 hours prior to your test.  On the Day of the Test: . Drink plenty of water. Do not drink any water within one hour of the test. . Do not eat any food 4 hours prior to the test. . You may take your regular medications prior to the test.  . Take metoprolol (Lopressor) two hours prior to test. . HOLD Furosemide/Hydrochlorothiazide morning of the test.      After the Test: . Drink plenty of water. . After receiving IV contrast, you may experience a mild flushed feeling. This is normal. . On occasion, you may  experience a mild rash up to 24 hours after the test. This is not dangerous. If this occurs, you can take Benadryl 25 mg and increase your fluid intake. . If you experience trouble breathing, this can be serious. If it is severe call 911 IMMEDIATELY. If it is mild, please call our office. . If you take any of these medications: Glipizide/Metformin, Avandament, Glucavance, please do not take 48 hours after completing test unless otherwise instructed.   Once we  have confirmed authorization from your insurance company, we will call you to set up a date and time for your test. Based on how quickly your insurance processes prior authorizations requests, please allow up to 4 weeks to be contacted for scheduling your Cardiac CT appointment. Be advised that routine Cardiac CT appointments could be scheduled as many as 8 weeks after your provider has ordered it.  For non-scheduling related questions, please contact the cardiac imaging nurse navigator should you have any questions/concerns: Marchia Bond, Cardiac Imaging Nurse Navigator Burley Saver, Interim Cardiac Imaging Nurse [place] [facility] Direct Office Dial: [phone]   For scheduling needs, including cancellations and rescheduling, please call Vivien Rota at [phone], option 3.      Electrophysiology/Ablation Procedure Instructions   You are scheduled for a(n) ___________ ablation on __________ with Dr. Allegra Lai.   1.   Pre procedure testing-             A.  LAB WORK --- On _________ for your pre procedure blood work.                 B. COVID TEST-- On ______________ @ ______________ -  This is a Drive Up Visit at [address] [place], [place] [address].  Someone will direct you to the appropriate testing line. Stay in your car and someone will be with you shortly.   After you are tested please go home and self quarantine until the day of your procedure.     2. On the day of your procedure _____________ you will go to [facility] hospital [phone] at _______________.  You will go to the main entrance [address] [employer]) and enter where the [employer] parking staff are.  Your driver will drop you off and you will head down the hallway to ADMITTING.  You may have one support person come in to the hospital with you.  They will be asked to wait in the waiting room.   3.   Do not eat or drink after midnight prior to your procedure.   4.   Do NOT take any  medications the morning of your procedure.   5.  Plan for an overnight stay (if you go home same day, you will need someone to stay with you for 24 hours after the procedure).  If you use your phone frequently bring your phone charger.   6. You will follow up with the AFIB clinic 4 weeks after your procedure.  You will follow up with Dr. Curt Bears  3 months after your procedure.  These appointments will be made for you.   * If you have ANY questions please call the office [phone] and ask for Mirella Gueye RN or send me a MyChart message   * Occasionally, EP Studies and ablations can become lengthy.  Please make your family aware of this before your procedure starts.  Average time ranges from 2-8 hours for EP studies/ablations.  Your physician will call your family after  the procedure with the results.

## 2020-07-04 NOTE — Addendum Note (Signed)
Addended by: Eulis Foster on: 07/04/2020 02:40 PM   Modules accepted: Orders

## 2020-07-05 ENCOUNTER — Other Ambulatory Visit: Payer: Self-pay | Admitting: *Deleted

## 2020-07-05 DIAGNOSIS — H6123 Impacted cerumen, bilateral: Secondary | ICD-10-CM | POA: Diagnosis not present

## 2020-07-05 LAB — BASIC METABOLIC PANEL
BUN/Creatinine Ratio: 14 (ref 10–24)
BUN: 24 mg/dL (ref 8–27)
CO2: 23 mmol/L (ref 20–29)
Calcium: 9.7 mg/dL (ref 8.6–10.2)
Chloride: 103 mmol/L (ref 96–106)
Creatinine, Ser: 1.67 mg/dL — ABNORMAL HIGH (ref 0.76–1.27)
GFR calc Af Amer: 45 mL/min/{1.73_m2} — ABNORMAL LOW (ref >59)
GFR calc non Af Amer: 39 mL/min/{1.73_m2} — ABNORMAL LOW (ref >59)
Glucose: 251 mg/dL — ABNORMAL HIGH (ref 65–99)
Potassium: 4.9 mmol/L (ref 3.5–5.2)
Sodium: 143 mmol/L (ref 134–144)

## 2020-07-05 NOTE — Telephone Encounter (Signed)
Patient said he got a letter in the mail and voice mails from [place] asking why we keep asking what dose of Levothyroxine he's taking - he is taking 200 mcg once a day mon-sat and 1/2 tablet on Sunday.

## 2020-07-05 NOTE — Telephone Encounter (Signed)
We needed to confirm that the entry in the medical record is accurate and appears to be

## 2020-07-05 NOTE — Patient Outreach (Signed)
[facility]) Care Management  07/05/2020  Steven Maldonado [birth year] [id number]  RN Health Coach attempted follow up outreach call to patient.  Patient was unavailable. HIPPA compliance voicemail message left with return callback number.  Plan: RN will call patient again within 30 days.  [place] Care Management [phone]

## 2020-07-06 NOTE — Telephone Encounter (Signed)
Medication is correct in patient's chart and does reflect what he states that he is taking.

## 2020-07-07 ENCOUNTER — Other Ambulatory Visit: Payer: Medicare Other

## 2020-07-07 DIAGNOSIS — N1832 Chronic kidney disease, stage 3b: Secondary | ICD-10-CM | POA: Diagnosis not present

## 2020-07-12 ENCOUNTER — Other Ambulatory Visit: Payer: Self-pay | Admitting: *Deleted

## 2020-07-12 NOTE — Patient Outreach (Signed)
[facility]) Care Management  [place]  07/12/2020   Steven Maldonado Apr 24, 1941 [id number]  RN Health Coach telephone call to patient.  Hipaa compliance verified. Per  Patient hs A1C is 7.3. Per patient he is having some swelling and discomfort in rt toe.  Per patient his appetite is good.  Patient stated that he goes to the ENT and gets ear cleaned out usually every 6 months. Patient has not had any recent falls. Per patient his appetite is good. Patient has agreed to further outreach calls.   Encounter Medications:  Outpatient Encounter Medications as of 07/12/2020  Medication Sig  . amiodarone (PACERONE) 200 MG tablet Take 200 mg by mouth daily.  [name] Kitchen atorvastatin (LIPITOR) 80 MG tablet Take 1 tablet (80 mg total) by mouth at bedtime.  . BD PEN NEEDLE NANO 2ND GEN 32G X 4 MM MISC USE 4 (FOUR) TIMES DAILY.  [name] Kitchen Blood Glucose Monitoring Suppl (FREESTYLE LITE) DEVI 1 each by Does not apply route 2 (two) times daily.  . Choline Fenofibrate (FENOFIBRIC ACID) 135 MG CPDR Take 1 capsule by mouth daily.  . collagenase (SANTYL) ointment Apply 1 application topically daily. Wound measures 1.0cm x 1.5cm x 0.3cm  . dabigatran (PRADAXA) 150 MG CAPS capsule Take 1 capsule (150 mg total) by mouth every 12 (twelve) hours.  . docusate sodium (COLACE) 100 MG capsule Take 100 mg by mouth 2 (two) times daily.  . Evolocumab 140 MG/ML SOSY Inject 140 mg into the skin every 14 (fourteen) days.  [name] Kitchen ezetimibe (ZETIA) 10 MG tablet TAKE 1 TABLET BY MOUTH EVERY DAY  . FREESTYLE LITE test strip 1 EACH BY OTHER ROUTE 4 (FOUR) TIMES DAILY - BEFORE MEALS AND AT BEDTIME.  . furosemide (LASIX) 20 MG tablet Take 1 tablet (20 mg total) by mouth daily.  [name] Kitchen glipiZIDE (GLUCOTROL XL) 10 MG 24 hr tablet TAKE 1 TABLET (10 MG TOTAL) BY MOUTH DAILY WITH BREAKFAST.  [name] Kitchen Insulin Degludec (TRESIBA FLEXTOUCH) 200 UNIT/ML SOPN Inject 26 Units into the skin at bedtime.  . insulin lispro (HUMALOG KWIKPEN) 100 UNIT/ML  KwikPen Inject 4-6 units under the skin up to three times daily if eating sweets or high carb foods.  [name] Kitchen levothyroxine (SYNTHROID) 200 MCG tablet Take 1 tablet by mouth Monday-Saturday, and take 1/2 tablet on Sunday  . metoprolol tartrate (LOPRESSOR) 50 MG tablet TAKE ONE AND ONE-HALF TABLET BY MOUTH TWICE A DAY  . mirabegron ER (MYRBETRIQ) 25 MG TB24 tablet Take 25 mg by mouth 4 (four) times a week.  . mupirocin ointment (BACTROBAN) 2 % Apply to wound once a day.  . mupirocin ointment (BACTROBAN) 2 % Apply to great toe wound daily, with gauze and tape  . pentoxifylline (TRENTAL) 400 MG CR tablet Take 1 tablet (400 mg total) by mouth 3 (three) times daily with meals.  . Semaglutide,0.25 or 0.5MG/DOS, (OZEMPIC, 0.25 OR 0.5 MG/DOSE,) 2 MG/1.5ML SOPN Inject 0.5 mg into the skin once a week.  . silver sulfADIAZINE (SILVADENE) 1 % cream Apply 1 application topically daily.  . Vitamin D, Ergocalciferol, (DRISDOL) 1.25 MG (50000 UNIT) CAPS capsule Take 1 capsule (50,000 Units total) by mouth every 7 (seven) days.   No facility-administered encounter medications on file as of 07/12/2020.    Functional Status:  In your present state of health, do you have any difficulty performing the following activities: 01/06/2020 10/06/2019  Hearing? N N  Vision? N N  Difficulty concentrating or making decisions? N N  Walking or climbing  stairs? N Y  Comment lt amb has a prosthesis -  Dressing or bathing? N N  Doing errands, shopping? N N  Preparing Food and eating ? Y -  Comment per wife they eat out or she fixes it -  Using the Toilet? N -  In the past six months, have you accidently leaked urine? N -  Do you have problems with loss of bowel control? N -  Managing your Finances? N -  Housekeeping or managing your Housekeeping? Y -  Comment wife handles all housekeeping -  Some recent data might be hidden    Fall/Depression Screening: Fall Risk  07/12/2020 01/06/2020 10/06/2019  Falls in the past year? 1 1 0   Number falls in past yr: 1 1 -  Injury with Fall? 0 0 -  Risk for fall due to : History of fall(s);Impaired balance/gait;Impaired mobility History of fall(s);Impaired balance/gait;Impaired mobility Orthopedic patient;Impaired mobility  Follow up Falls evaluation completed Falls evaluation completed;Falls prevention discussed Falls evaluation completed   PHQ 2/9 Scores 05/25/2020 01/24/2020 01/06/2020 10/06/2019 07/02/2019 12/14/2018 10/07/2018  PHQ - 2 Score 0 0 0 0 0 0 0  PHQ- 9 Score 0 0 - 1 - - -   Goals Addressed              This Visit's Progress   .  Patient stated goal is A1C 7 or less (pt-stated)        CARE PLAN ENTRY (see longtitudinal plan of care for additional care plan information)  Objective:  Lab Results  Component Value Date   HGBA1C 7.3 (A) 05/25/2020 .   Lab Results  Component Value Date   CREATININE 1.67 (H) 07/04/2020   CREATININE 1.93 (H) 04/20/2020   CREATININE 1.90 (H) 03/06/2020 .   [name] Kitchen No results found for: EGFR  Current Barriers:  [name] Kitchen Knowledge Deficits related to basic Diabetes pathophysiology and self care/management  Case Manager Clinical Goal(s):  Over the next 90 days, patient will demonstrate improved adherence to prescribed treatment plan for diabetes self care/management as evidenced by:  [name] Kitchen Verbalize daily monitoring and recording of CBG within 90 days . Verbalize adherence to ADA/ carb modified diet within the next 90 days . Verbalize exercise 3 days/week . Verbalize adherence to prescribed medication regimen within the next 90 days   Interventions:  . Provided education to patient about basic DM disease process . Reviewed medications with patient and discussed importance of medication adherence . Discussed plans with patient for ongoing care management follow up and provided patient with direct contact information for care management team . Provided patient with written educational materials related to hypo and hyperglycemia and importance  of correct treatment . Reviewed scheduled/upcoming provider appointments including: Flu vaccine, eye exam . Advised patient, providing education and rationale, to check cbg as per Dr ordered and record, calling Dr Dwyane Dee for findings outside established parameters.    Patient Self Care Activities:  . Self administers oral medications as prescribed . Self administers insulin as prescribed . Self administers injectable DM medication (Ozempic) as prescribed . Attends all scheduled provider appointments . Checks blood sugars as prescribed and utilize hyper and hypoglycemia protocol as needed . Adheres to prescribed ADA/carb modified  Initial goal documentation        Assessment:  A1C 7.3 Rt toe has some swelling now/ Lt BKA Appetite good Recent ear exam and cleaning  Plan:  Include chair exercises in daily routine Follow up with flu vaccine Discussed healthy eating Discussed A1C  goal of 7 or less and achieving RN Health Coach will follow up within then month OF November RN sent an update assessment to PCP  [place] Management [phone]

## 2020-07-13 ENCOUNTER — Other Ambulatory Visit: Payer: Self-pay

## 2020-07-13 ENCOUNTER — Ambulatory Visit (INDEPENDENT_AMBULATORY_CARE_PROVIDER_SITE_OTHER): Payer: Medicare Other | Admitting: Podiatry

## 2020-07-13 ENCOUNTER — Ambulatory Visit: Payer: Medicare Other | Admitting: Podiatry

## 2020-07-13 DIAGNOSIS — I739 Peripheral vascular disease, unspecified: Secondary | ICD-10-CM | POA: Diagnosis not present

## 2020-07-13 DIAGNOSIS — Z89512 Acquired absence of left leg below knee: Secondary | ICD-10-CM

## 2020-07-13 DIAGNOSIS — I350 Nonrheumatic aortic (valve) stenosis: Secondary | ICD-10-CM | POA: Diagnosis not present

## 2020-07-13 DIAGNOSIS — E11649 Type 2 diabetes mellitus with hypoglycemia without coma: Secondary | ICD-10-CM

## 2020-07-13 DIAGNOSIS — E1142 Type 2 diabetes mellitus with diabetic polyneuropathy: Secondary | ICD-10-CM

## 2020-07-13 DIAGNOSIS — I129 Hypertensive chronic kidney disease with stage 1 through stage 4 chronic kidney disease, or unspecified chronic kidney disease: Secondary | ICD-10-CM | POA: Diagnosis not present

## 2020-07-13 DIAGNOSIS — L97514 Non-pressure chronic ulcer of other part of right foot with necrosis of bone: Secondary | ICD-10-CM

## 2020-07-13 DIAGNOSIS — N1832 Chronic kidney disease, stage 3b: Secondary | ICD-10-CM | POA: Diagnosis not present

## 2020-07-13 DIAGNOSIS — I5032 Chronic diastolic (congestive) heart failure: Secondary | ICD-10-CM | POA: Diagnosis not present

## 2020-07-13 DIAGNOSIS — E559 Vitamin D deficiency, unspecified: Secondary | ICD-10-CM | POA: Diagnosis not present

## 2020-07-13 DIAGNOSIS — I2510 Atherosclerotic heart disease of native coronary artery without angina pectoris: Secondary | ICD-10-CM | POA: Diagnosis not present

## 2020-07-13 DIAGNOSIS — E1122 Type 2 diabetes mellitus with diabetic chronic kidney disease: Secondary | ICD-10-CM | POA: Diagnosis not present

## 2020-07-18 NOTE — Progress Notes (Signed)
  Subjective:  Patient ID: Steven Maldonado, male    DOB: May 09, 1941,  MRN: [id number]  Follow-up on ulcer of right hallux  79 y.o. male returns with the above complaint. History confirmed with patient. He has been applying Bactroban daily, his wife has been helping him.  Vascular testing has been rescheduled to September 20  Objective:  Physical Exam: warm, good capillary refill, DP reduced right, PT reduced right and reduced sensation at plantar pulps of toes, intact dorsally. Right Foot: Ulcer of nail bed with fibrotic wound base, this now probes down to the underlying phalanx.  It measures 1.2 x 1.5 x 0.5 cm.  No significant purulence noted or malodor. No cellulitis today, periwound erythema has improved significantly  Assessment:   1. PAD (peripheral artery disease) ([place])   2. 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 Diabetic peripheral neuropathy associated with type 2 diabetes mellitus ([place])   4. Ischemic ulcer of toe, right, with necrosis of bone ([place])   5. Uncontrolled type 2 diabetes mellitus with hypoglycemia without coma ([place])     Plan:  Patient was evaluated and treated and all questions answered.  -Continue current local wound care -ABI TBI and ultrasound are scheduled for September 20 and we will refer him to vascular following the testing.  Ulcer right hallux -Debridement as below. -Dressed with Iodosorb, DSD. -Continue off-loading with surgical shoe.  Procedure: Sharp selective debridement of Wound Rationale: Removal of non-viable soft tissue from the wound to promote healing.  Anesthesia: none Pre-Debridement Wound Measurements: 1.2 x 1.5 x 0.3 cm Post-Debridement Wound Measurements: 1.2 x 1.5 x 0.3 Type of Debridement: Sharp excisional Tissue Removed: Non-viable soft tissue Depth of Debridement: subcutaneous tissue. Technique: S Sharp excisional debridement to bleeding, viable wound base.  Dressing: Dry, sterile, compression  dressing. Disposition: Patient tolerated procedure well. Patient to return in 3 week for follow-up.         Return in 19 days (on 08/01/2020).    Lanae Crumbly, DPM

## 2020-07-19 ENCOUNTER — Ambulatory Visit (HOSPITAL_COMMUNITY)
Admission: RE | Admit: 2020-07-19 | Payer: Medicare Other | Source: Ambulatory Visit | Attending: Podiatry | Admitting: Podiatry

## 2020-07-24 ENCOUNTER — Telehealth: Payer: Self-pay | Admitting: Physician Assistant

## 2020-07-24 MED ORDER — FENOFIBRIC ACID 135 MG PO CPDR: 1.0000 | DELAYED_RELEASE_CAPSULE | Freq: Every day | ORAL | 0 refills | Status: DC

## 2020-07-24 MED ORDER — MIRABEGRON ER 25 MG PO TB24
25.0000 mg | ORAL_TABLET | ORAL | 0 refills | Status: DC
Start: 2020-07-25 — End: 2020-09-26

## 2020-07-24 MED ORDER — DABIGATRAN ETEXILATE MESYLATE 150 MG PO CAPS: 150.0000 mg | ORAL_CAPSULE | Freq: Two times a day (BID) | ORAL | 0 refills | Status: DC

## 2020-07-24 MED ORDER — TRESIBA FLEXTOUCH 200 UNIT/ML ~~LOC~~ SOPN: 25.0000 [IU] | PEN_INJECTOR | Freq: Every day | SUBCUTANEOUS | 1 refills | Status: DC

## 2020-07-24 NOTE — Telephone Encounter (Signed)
Med sent to requested pharmacy. AS, CMA 

## 2020-07-24 NOTE — Telephone Encounter (Signed)
Patient is requesting a refill of his [name], mirabegron, and dabigatran. If approved please send to [facility]  Also, he needs a refill of his fenofibrate acid, but needs that to go to his local [facility] on [address].

## 2020-07-24 NOTE — Addendum Note (Signed)
Addended by: Mickel Crow on: 07/24/2020 10:44 AM   Modules accepted: Orders

## 2020-07-31 ENCOUNTER — Other Ambulatory Visit: Payer: Self-pay

## 2020-07-31 ENCOUNTER — Ambulatory Visit (HOSPITAL_COMMUNITY)
Admission: RE | Admit: 2020-07-31 | Discharge: 2020-07-31 | Disposition: A | Discharge status: Home / Self Care | Payer: Medicare Other | Source: Ambulatory Visit | Attending: Internal Medicine | Admitting: Internal Medicine

## 2020-07-31 ENCOUNTER — Ambulatory Visit (INDEPENDENT_AMBULATORY_CARE_PROVIDER_SITE_OTHER): Payer: Medicare Other | Admitting: Podiatry

## 2020-07-31 DIAGNOSIS — Z89512 Acquired absence of left leg below knee: Secondary | ICD-10-CM | POA: Diagnosis not present

## 2020-07-31 DIAGNOSIS — I739 Peripheral vascular disease, unspecified: Secondary | ICD-10-CM

## 2020-07-31 DIAGNOSIS — E1142 Type 2 diabetes mellitus with diabetic polyneuropathy: Secondary | ICD-10-CM | POA: Diagnosis not present

## 2020-07-31 DIAGNOSIS — L97514 Non-pressure chronic ulcer of other part of right foot with necrosis of bone: Secondary | ICD-10-CM | POA: Diagnosis not present

## 2020-07-31 DIAGNOSIS — E11649 Type 2 diabetes mellitus with hypoglycemia without coma: Secondary | ICD-10-CM

## 2020-07-31 DIAGNOSIS — L97512 Non-pressure chronic ulcer of other part of right foot with fat layer exposed: Secondary | ICD-10-CM | POA: Diagnosis not present

## 2020-07-31 NOTE — Progress Notes (Signed)
  Subjective:  Patient ID: Steven Maldonado, male    DOB: [birth year],  MRN: [id number]  Follow-up on ulcer of right hallux  79 y.o. male returns with the above complaint. History confirmed with patient. He has been applying Bactroban daily, his wife has been helping him.  He is going for his noninvasive vascular testing this afternoon at 3 PM  Objective:  Physical Exam: warm, good capillary refill, DP reduced right, PT reduced right and reduced sensation at plantar pulps of toes, intact dorsally. Right Foot: Ulcer of nail bed with fibrotic wound base, this now probes down to the underlying phalanx.  It measures 1.2 x 1.5 x 0.5 cm.  No significant purulence noted or malodor. No cellulitis   Assessment:   1. 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 PAD (peripheral artery disease) ([place])   3. Diabetic peripheral neuropathy associated with type 2 diabetes mellitus ([place])   4. Ischemic ulcer of toe, right, with necrosis of bone ([place])   5. Uncontrolled type 2 diabetes mellitus with hypoglycemia without coma ([place])     Plan:  Patient was evaluated and treated and all questions answered.  -Continue current local wound care -ABI TBI and ultrasound are scheduled today and we will refer him to vascular following the testing.  Ulcer right hallux -Debridement as below. -Dressed with Iodosorb, DSD. -Continue off-loading with surgical shoe.  Procedure: Sharp selective debridement of Wound Rationale: Removal of non-viable soft tissue from the wound to promote healing.  Anesthesia: none Pre-Debridement Wound Measurements: 1.2 x 1.5 x 0.3 cm Post-Debridement Wound Measurements: 1.2 x 1.5 x 0.3 Type of Debridement: Sharp selective Tissue Removed: Non-viable soft tissue Depth of Debridement: subcutaneous tissue. Technique: Sharp selective l debridement to bleeding, viable wound base.  Dressing: Dry, sterile, compression dressing. Disposition: Patient tolerated procedure  well. Patient to return in 2 week for follow-up.         Return in about 2 weeks (around 08/14/2020).    Lanae Crumbly, DPM

## 2020-08-03 ENCOUNTER — Ambulatory Visit: Payer: Medicare Other | Admitting: Podiatry

## 2020-08-07 DIAGNOSIS — Z23 Encounter for immunization: Secondary | ICD-10-CM | POA: Diagnosis not present

## 2020-08-09 ENCOUNTER — Telehealth: Payer: Self-pay | Admitting: *Deleted

## 2020-08-09 NOTE — Telephone Encounter (Signed)
lmtcb to discuss recent lower arterial study and plan going forward w/ Dr. Nicky Pugh

## 2020-08-09 NOTE — Telephone Encounter (Signed)
Pt reports he is meeting with Dr. Gwenlyn Found on 10/5 to further discuss LE arterial findings. Aware that I will follow up at a later date to discuss AFib/Dr. Camnitz plan.  Aware I am holding a date in November for procedure and this is dependent on consultation w/ Dr. Gwenlyn Found. Patient verbalized understanding and agreeable to plan.

## 2020-08-15 ENCOUNTER — Other Ambulatory Visit: Payer: Self-pay

## 2020-08-15 ENCOUNTER — Encounter: Payer: Self-pay | Admitting: Cardiovascular Disease

## 2020-08-15 ENCOUNTER — Ambulatory Visit (INDEPENDENT_AMBULATORY_CARE_PROVIDER_SITE_OTHER): Payer: Medicare Other | Admitting: Cardiovascular Disease

## 2020-08-15 VITALS — BP 130/60 | HR 78 | Ht 71.0 in | Wt 248.0 lb

## 2020-08-15 DIAGNOSIS — I739 Peripheral vascular disease, unspecified: Secondary | ICD-10-CM | POA: Diagnosis not present

## 2020-08-15 DIAGNOSIS — Z01812 Encounter for preprocedural laboratory examination: Secondary | ICD-10-CM

## 2020-08-15 DIAGNOSIS — I2510 Atherosclerotic heart disease of native coronary artery without angina pectoris: Secondary | ICD-10-CM | POA: Diagnosis not present

## 2020-08-15 NOTE — Progress Notes (Signed)
08/15/2020 Steven Maldonado   [birth year]  [id number]  Primary Physician Lorrene Reid, [name] Primary Cardiologist: Lorretta Harp MD Lupe Carney, [place]  HPI:  Steven Maldonado is a 79 y.o. severely overweight Caucasian male with a history of paroxysmal A. fib, uncontrolled diabetes, hypertension, dyslipidemia and PAD.  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  This is complicated by complete heart block requiring pacemaker insertion.  He has had PAF requiring DC cardioversion currently on Pradaxa.  His last cardiac cath performed November 2018 showed a 60% proximal LAD stenosis and known occluded distal RCA.  He had his right popliteal artery intervened on by Dr. Fletcher Anon July 2015 with a drug-coated balloon.  He had a gangrenous left third toe and had intervention by Dr. Bridgett Larsson 04/09/2018 with stenting of his left SFA but unfortunately cannot open his left popliteal artery.  He ultimately underwent left third toe amputation by Dr. Due to and ultimately a left BKA.  He has had a nonhealing ulcer on his right great toe and the nail bed.  He was referred to me by his podiatrist, Dr. Lanae Crumbly for slow healing wound after months of debridement.  He did have Dopplers performed on office 07/31/2020 that showed a noncompressible right tibial vessel with disease in his distal right SFA and popliteal artery.  He does have moderate renal insufficiency with a creatinine that runs in the high 1 range.   Current Meds  Medication Sig  . amiodarone (PACERONE) 200 MG tablet Take 200 mg by mouth daily.  [name] Kitchen atorvastatin (LIPITOR) 80 MG tablet Take 1 tablet (80 mg total) by mouth at bedtime.  . BD PEN NEEDLE NANO 2ND GEN 32G X 4 MM MISC USE 4 (FOUR) TIMES DAILY.  [name] Kitchen Blood Glucose Monitoring Suppl (FREESTYLE LITE) DEVI 1 each by Does not apply route 2 (two) times daily.  . Bromfenac Sodium (PROLENSA) 0.07 % SOLN Prolensa 0.07 % eye drops  INSTILL 1 DROP INTO RIGHT EYE AT BEDTIME  . Choline  Fenofibrate (FENOFIBRIC ACID) 135 MG CPDR Take 1 capsule by mouth daily.  . dabigatran (PRADAXA) 150 MG CAPS capsule Take 1 capsule (150 mg total) by mouth every 12 (twelve) hours.  . docusate sodium (COLACE) 100 MG capsule Take 100 mg by mouth 2 (two) times daily.  . Evolocumab 140 MG/ML SOSY Inject 140 mg into the skin every 14 (fourteen) days.  [name] Kitchen ezetimibe (ZETIA) 10 MG tablet TAKE 1 TABLET BY MOUTH EVERY DAY  . FREESTYLE LITE test strip 1 EACH BY OTHER ROUTE 4 (FOUR) TIMES DAILY - BEFORE MEALS AND AT BEDTIME.  . furosemide (LASIX) 20 MG tablet Take 1 tablet (20 mg total) by mouth daily.  [name] Kitchen glipiZIDE (GLUCOTROL XL) 10 MG 24 hr tablet TAKE 1 TABLET (10 MG TOTAL) BY MOUTH DAILY WITH BREAKFAST.  [name] Kitchen insulin degludec (TRESIBA FLEXTOUCH) 200 UNIT/ML FlexTouch Pen Inject 26 Units into the skin at bedtime.  . insulin lispro (HUMALOG KWIKPEN) 100 UNIT/ML KwikPen Inject 4-6 units under the skin up to three times daily if eating sweets or high carb foods.  [name] Kitchen levothyroxine (SYNTHROID) 200 MCG tablet Take 1 tablet by mouth Monday-Saturday, and take 1/2 tablet on Sunday  . metoprolol tartrate (LOPRESSOR) 50 MG tablet TAKE ONE AND ONE-HALF TABLET BY MOUTH TWICE A DAY  . mirabegron ER (MYRBETRIQ) 25 MG TB24 tablet Take 1 tablet (25 mg total) by mouth 4 (four) times a week.  . mupirocin ointment (BACTROBAN) 2 % Apply to  wound once a day.  . mupirocin ointment (BACTROBAN) 2 % Apply to great toe wound daily, with gauze and tape  . pentoxifylline (TRENTAL) 400 MG CR tablet Take 1 tablet (400 mg total) by mouth 3 (three) times daily with meals.  . Semaglutide,0.25 or 0.5MG /DOS, (OZEMPIC, 0.25 OR 0.5 MG/DOSE,) 2 MG/1.5ML SOPN Inject 0.5 mg into the skin once a week.  . silver sulfADIAZINE (SILVADENE) 1 % cream Apply 1 application topically daily.  . Vitamin D, Ergocalciferol, (DRISDOL) 1.25 MG (50000 UNIT) CAPS capsule Take 1 capsule (50,000 Units total) by mouth every 7 (seven) days.     No Known  Allergies  Social History   Socioeconomic History  . Marital status: Married    Spouse name: [name]  . Number of children: 1  . Years of education: 52  . Highest education level: Not on file  Occupational History  . Occupation: retired    Fish farm manager: [employer]  Tobacco Use  . Smoking status: Former Smoker    Packs/day: 2.00    Years: 14.00    Pack years: 28.00    Types: Cigarettes    Quit date: 11/11/1972    Years since quitting: 47.7  . Smokeless tobacco: Never Used  Vaping Use  . Vaping Use: Never used  Substance and Sexual Activity  . Alcohol use: No    Comment: quit in 1984  . Drug use: No  . Sexual activity: Not Currently  Other Topics Concern  . 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   Patient has one child and his wife has one child.   Patient is retired.   Patient has a high school education.   Patient is right-handed.   Patient drinks very little caffeine.   Social Determinants of Health   Financial Resource Strain:   . Difficulty of Paying Living Expenses: Not on file  Food Insecurity: No Food Insecurity  . Worried About Charity fundraiser in the Last Year: Never true  . Ran Out of Food in the Last Year: Never true  Transportation Needs: No Transportation Needs  . Lack of Transportation (Medical): No  . Lack of Transportation (Non-Medical): No  Physical Activity:   . Days of Exercise per Week: Not on file  . Minutes of Exercise per Session: Not on file  Stress:   . Feeling of Stress : Not on file  Social Connections:   . Frequency of Communication with Friends and Family: Not on file  . Frequency of Social Gatherings with Friends and Family: Not on file  . Attends Religious Services: Not on file  . Active Member of Clubs or Organizations: Not on file  . Attends Archivist Meetings: Not on file  . Marital Status: Not on file  Intimate Partner Violence:   . Fear of  Current or Ex-Partner: Not on file  . Emotionally Abused: Not on file  . Physically Abused: Not on file  . Sexually Abused: Not on file     Review of Systems: General: negative for chills, fever, night sweats or weight changes.  Cardiovascular: negative for chest pain, dyspnea on exertion, edema, orthopnea, palpitations, paroxysmal nocturnal dyspnea or shortness of breath Dermatological: negative for rash Respiratory: negative for cough or wheezing Urologic: negative for hematuria Abdominal: negative for nausea, vomiting, diarrhea, bright red blood per rectum, melena, or hematemesis Neurologic: negative for visual changes, syncope, or dizziness All other systems reviewed and are  otherwise negative except as noted above.    Blood pressure 130/60, pulse 78, height 5\' 11"  (1.803 m), weight 248 lb (112.5 kg).  General appearance: alert and no distress Neck: no adenopathy, no carotid bruit, no JVD, supple, symmetrical, trachea midline and thyroid not enlarged, symmetric, no tenderness/mass/nodules Lungs: clear to auscultation bilaterally Heart: regular rate and rhythm, S1, S2 normal, no murmur, click, rub or gallop Extremities: extremities normal, atraumatic, no cyanosis or edema Pulses: Absent pedal pulses Skin: Skin color, texture, turgor normal. No rashes or lesions Neurologic: Alert and oriented X 3, normal strength and tone. Normal symmetric reflexes. Normal coordination and gait  EKG not performed today  ASSESSMENT AND PLAN:   PAD (peripheral artery disease) (HCC) History of PAD status post right popliteal artery stenosis treated with drug-coated balloon angioplasty by Dr. Fletcher Anon July 2015.  He had a gangrenous left toe and underwent angiography by Dr. 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 Sharol Given and ultimately required a BKA.  He has had a nonhealing wound  on his nailbed of his right first toe and was referred back to me by Dr. Lanae Crumbly, podiatrist at [facility].  He has been debriding this for several months.  He did have lower extremity arterial Doppler studies performed in our office 07/31/2020 that showed noncompressible right tibial vessels with high-grade disease in his distal right SFA and popliteal artery.  Since he has had prior interventions by Dr. Fletcher Anon I am going to set him up for angiography next Wednesday.  We will hold the Pradaxa for 2 days prior to the procedure and arrange for him to come in for prehydration since his creatinine is 1.67.      Lorretta Harp MD [facility], [facility] 08/15/2020 3:26 PM

## 2020-08-15 NOTE — H&P (View-Only) (Signed)
08/15/2020 Steven Maldonado   12/29/1940  [id number]  Primary Physician Lorrene Reid, [name] Primary Cardiologist: Lorretta Harp MD Lupe Maldonado, Steven  HPI:  Steven Maldonado is a 79 y.o. severely overweight Caucasian male with a history of paroxysmal A. fib, uncontrolled diabetes, hypertension, dyslipidemia and PAD.  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  This is complicated by complete heart block requiring pacemaker insertion.  He has had PAF requiring DC cardioversion currently on Pradaxa.  His last cardiac cath performed November 2018 showed a 60% proximal LAD stenosis and known occluded distal RCA.  He had his right popliteal artery intervened on by Dr. Fletcher Anon July 2015 with a drug-coated balloon.  He had a gangrenous left third toe and had intervention by Dr. Bridgett Larsson 04/09/2018 with stenting of his left SFA but unfortunately cannot open his left popliteal artery.  He ultimately underwent left third toe amputation by Dr. Due to and ultimately a left BKA.  He has had a nonhealing ulcer on his right great toe and the nail bed.  He was referred to me by his podiatrist, Dr. Lanae Maldonado for slow healing wound after months of debridement.  He did have Dopplers performed on office 07/31/2020 that showed a noncompressible right tibial vessel with disease in his distal right SFA and popliteal artery.  He does have moderate renal insufficiency with a creatinine that runs in the high 1 range.   Current Meds  Medication Sig  . amiodarone (PACERONE) 200 MG tablet Take 200 mg by mouth daily.  [name] Kitchen atorvastatin (LIPITOR) 80 MG tablet Take 1 tablet (80 mg total) by mouth at bedtime.  . BD PEN NEEDLE NANO 2ND GEN 32G X 4 MM MISC USE 4 (FOUR) TIMES DAILY.  [name] Kitchen Blood Glucose Monitoring Suppl (FREESTYLE LITE) DEVI 1 each by Does not apply route 2 (two) times daily.  . Bromfenac Sodium (PROLENSA) 0.07 % SOLN Prolensa 0.07 % eye drops  INSTILL 1 DROP INTO RIGHT EYE AT BEDTIME  . Choline  Fenofibrate (FENOFIBRIC ACID) 135 MG CPDR Take 1 capsule by mouth daily.  . dabigatran (PRADAXA) 150 MG CAPS capsule Take 1 capsule (150 mg total) by mouth every 12 (twelve) hours.  . docusate sodium (COLACE) 100 MG capsule Take 100 mg by mouth 2 (two) times daily.  . Evolocumab 140 MG/ML SOSY Inject 140 mg into the skin every 14 (fourteen) days.  [name] Kitchen ezetimibe (ZETIA) 10 MG tablet TAKE 1 TABLET BY MOUTH EVERY DAY  . FREESTYLE LITE test strip 1 EACH BY OTHER ROUTE 4 (FOUR) TIMES DAILY - BEFORE MEALS AND AT BEDTIME.  . furosemide (LASIX) 20 MG tablet Take 1 tablet (20 mg total) by mouth daily.  [name] Kitchen glipiZIDE (GLUCOTROL XL) 10 MG 24 hr tablet TAKE 1 TABLET (10 MG TOTAL) BY MOUTH DAILY WITH BREAKFAST.  [name] Kitchen insulin degludec (TRESIBA FLEXTOUCH) 200 UNIT/ML FlexTouch Pen Inject 26 Units into the skin at bedtime.  . insulin lispro (HUMALOG KWIKPEN) 100 UNIT/ML KwikPen Inject 4-6 units under the skin up to three times daily if eating sweets or high carb foods.  [name] Kitchen levothyroxine (SYNTHROID) 200 MCG tablet Take 1 tablet by mouth Monday-Saturday, and take 1/2 tablet on Sunday  . metoprolol tartrate (LOPRESSOR) 50 MG tablet TAKE ONE AND ONE-HALF TABLET BY MOUTH TWICE A DAY  . mirabegron ER (MYRBETRIQ) 25 MG TB24 tablet Take 1 tablet (25 mg total) by mouth 4 (four) times a week.  . mupirocin ointment (BACTROBAN) 2 % Apply to  wound once a day.  . mupirocin ointment (BACTROBAN) 2 % Apply to great toe wound daily, with gauze and tape  . pentoxifylline (TRENTAL) 400 MG CR tablet Take 1 tablet (400 mg total) by mouth 3 (three) times daily with meals.  . Semaglutide,0.25 or 0.5MG /DOS, (OZEMPIC, 0.25 OR 0.5 MG/DOSE,) 2 MG/1.5ML SOPN Inject 0.5 mg into the skin once a week.  . silver sulfADIAZINE (SILVADENE) 1 % cream Apply 1 application topically daily.  . Vitamin D, Ergocalciferol, (DRISDOL) 1.25 MG (50000 UNIT) CAPS capsule Take 1 capsule (50,000 Units total) by mouth every 7 (seven) days.     No Known  Allergies  Social History   Socioeconomic History  . Marital status: Married    Spouse name: Steven Maldonado  . Number of children: 1  . Years of education: 2  . Highest education level: Not on file  Occupational History  . Occupation: retired    Fish farm manager: [employer]  Tobacco Use  . Smoking status: Former Smoker    Packs/day: 2.00    Years: 14.00    Pack years: 28.00    Types: Cigarettes    Quit date: 11/11/1972    Years since quitting: 47.7  . Smokeless tobacco: Never Used  Vaping Use  . Vaping Use: Never used  Substance and Sexual Activity  . Alcohol use: No    Comment: quit in 1984  . Drug use: No  . Sexual activity: Not Currently  Other Topics Concern  . 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   Patient has one child and his wife has one child.   Patient is retired.   Patient has a high school education.   Patient is right-handed.   Patient drinks very little caffeine.   Social Determinants of Health   Financial Resource Strain:   . Difficulty of Paying Living Expenses: Not on file  Food Insecurity: No Food Insecurity  . Worried About Charity fundraiser in the Last Year: Never true  . Ran Out of Food in the Last Year: Never true  Transportation Needs: No Transportation Needs  . Lack of Transportation (Medical): No  . Lack of Transportation (Non-Medical): No  Physical Activity:   . Days of Exercise per Week: Not on file  . Minutes of Exercise per Session: Not on file  Stress:   . Feeling of Stress : Not on file  Social Connections:   . Frequency of Communication with Friends and Family: Not on file  . Frequency of Social Gatherings with Friends and Family: Not on file  . Attends Religious Services: Not on file  . Active Member of Clubs or Organizations: Not on file  . Attends Archivist Meetings: Not on file  . Marital Status: Not on file  Intimate Partner Violence:   . Fear of  Current or Ex-Partner: Not on file  . Emotionally Abused: Not on file  . Physically Abused: Not on file  . Sexually Abused: Not on file     Review of Systems: General: negative for chills, fever, night sweats or weight changes.  Cardiovascular: negative for chest pain, dyspnea on exertion, edema, orthopnea, palpitations, paroxysmal nocturnal dyspnea or shortness of breath Dermatological: negative for rash Respiratory: negative for cough or wheezing Urologic: negative for hematuria Abdominal: negative for nausea, vomiting, diarrhea, bright red blood per rectum, melena, or hematemesis Neurologic: negative for visual changes, syncope, or dizziness All other systems reviewed and are  otherwise negative except as noted above.    Blood pressure 130/60, pulse 78, height 5\' 11"  (1.803 m), weight 248 lb (112.5 kg).  General appearance: alert and no distress Neck: no adenopathy, no carotid bruit, no JVD, supple, symmetrical, trachea midline and thyroid not enlarged, symmetric, no tenderness/mass/nodules Lungs: clear to auscultation bilaterally Heart: regular rate and rhythm, S1, S2 normal, no murmur, click, rub or gallop Extremities: extremities normal, atraumatic, no cyanosis or edema Pulses: Absent pedal pulses Skin: Skin color, texture, turgor normal. No rashes or lesions Neurologic: Alert and oriented X 3, normal strength and tone. Normal symmetric reflexes. Normal coordination and gait  EKG not performed today  ASSESSMENT AND PLAN:   PAD (peripheral artery disease) (HCC) History of PAD status post right popliteal artery stenosis treated with drug-coated balloon angioplasty by Dr. Fletcher Anon July 2015.  He had a gangrenous left toe and underwent angiography by Dr. 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 Sharol Given and ultimately required a BKA.  He has had a nonhealing wound  on his nailbed of his right first toe and was referred back to me by Dr. Lanae Maldonado, podiatrist at [facility].  He has been debriding this for several months.  He did have lower extremity arterial Doppler studies performed in our office 07/31/2020 that showed noncompressible right tibial vessels with high-grade disease in his distal right SFA and popliteal artery.  Since he has had prior interventions by Dr. Fletcher Anon I am going to set him up for angiography next Wednesday.  We will hold the Pradaxa for 2 days prior to the procedure and arrange for him to come in for prehydration since his creatinine is 1.67.      Lorretta Harp MD [facility], [facility] 08/15/2020 3:26 PM

## 2020-08-15 NOTE — Patient Instructions (Addendum)
    Steven Maldonado [name] [address] Dept: [phone] Loc: [phone]  Steven Maldonado  08/15/2020  You are scheduled for a Peripheral Angiogram on Wednesday, October 13 with Dr. [name].  1. Please arrive at the [facility] (Main Entrance A) at [facility]: [address] [place], [place] [address] at 5:30 AM  . Free valet parking service is available.   Special note: Every effort is made to have your procedure done on time. Please understand that emergencies sometimes delay scheduled procedures.  2. Diet: Do not eat solid foods after midnight.  The patient may have clear liquids until 5am upon the day of the procedure.  3. Labs: You will need to have blood drawn on TODAY  4. Medication instructions in preparation for your procedure:   DO NOT TAKE GLIPIZIDE THE MORNING OF THE PROCEDURE  TAKE 1/2 THE EVENING DOSE OF INSULIN THE NIGHT PRIOR TO THE PROCEDURE AND NONE THE MORNING OF THE PROCEDURE  DO NOT TAKE FUROSEMIDE THE MORNING OF THE PROCEDURE  TAKE THE LAST DOSE OF PRADAXA ON Sunday 08/20/20  GO TO [address] Saturday 08/19/20 @ 11:25 AM FOR COVID TESTING  On the morning of your procedure, take your Aspirin and any morning medicines NOT listed above.  You may use sips of water.  5. Plan for one night stay--bring personal belongings. 6. Bring a current list of your medications and current insurance cards. 7. You MUST have a responsible person to drive you home. 8. Someone MUST be with you the first 24 hours after you arrive home or your discharge will be delayed. 9. Please wear clothes that are easy to get on and off and wear slip-on shoes.  Thank you for allowing [place] to care for you!   -- [facility]   Your physician recommends that you schedule a follow-up appointment in: [place]

## 2020-08-15 NOTE — Assessment & Plan Note (Signed)
History of PAD status post right popliteal artery stenosis treated with drug-coated balloon angioplasty by Dr. Fletcher Anon July 2015.  He had a gangrenous left toe and underwent angiography by Dr. 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 Sharol Given and ultimately required a BKA.  He has had a nonhealing wound on his nailbed of his right first toe and was referred back to me by Dr. Lanae Crumbly, podiatrist at [facility].  He has been debriding this for several months.  He did have lower extremity arterial Doppler studies performed in our office 07/31/2020 that showed noncompressible right tibial vessels with high-grade disease in his distal right SFA and popliteal artery.  Since he has had prior interventions by Dr. Fletcher Anon I am going to set him up for angiography next Wednesday.  We will hold the Pradaxa for 2 days prior to the procedure and arrange for him to come in for prehydration since his creatinine is 1.67.

## 2020-08-16 DIAGNOSIS — Z23 Encounter for immunization: Secondary | ICD-10-CM | POA: Diagnosis not present

## 2020-08-16 LAB — BASIC METABOLIC PANEL
BUN/Creatinine Ratio: 14 (ref 10–24)
BUN: 20 mg/dL (ref 8–27)
CO2: 21 mmol/L (ref 20–29)
Calcium: 9.5 mg/dL (ref 8.6–10.2)
Chloride: 103 mmol/L (ref 96–106)
Creatinine, Ser: 1.4 mg/dL — ABNORMAL HIGH (ref 0.76–1.27)
GFR calc Af Amer: 56 mL/min/{1.73_m2} — ABNORMAL LOW (ref >59)
GFR calc non Af Amer: 48 mL/min/{1.73_m2} — ABNORMAL LOW (ref >59)
Glucose: 242 mg/dL — ABNORMAL HIGH (ref 65–99)
Potassium: 4.6 mmol/L (ref 3.5–5.2)
Sodium: 140 mmol/L (ref 134–144)

## 2020-08-16 LAB — CBC
Hematocrit: 38.5 % (ref 37.5–51.0)
Hemoglobin: 13 g/dL (ref 13.0–17.7)
MCH: 31.8 pg (ref 26.6–33.0)
MCHC: 33.8 g/dL (ref 31.5–35.7)
MCV: 94 fL (ref 79–97)
Platelets: 161 10*3/uL (ref 150–450)
RBC: 4.09 x10E6/uL — ABNORMAL LOW (ref 4.14–5.80)
RDW: 12.2 % (ref 11.6–15.4)
WBC: 5.9 10*3/uL (ref 3.4–10.8)

## 2020-08-19 ENCOUNTER — Other Ambulatory Visit (HOSPITAL_COMMUNITY)
Admission: RE | Admit: 2020-08-19 | Discharge: 2020-08-19 | Disposition: A | Discharge status: Home / Self Care | Payer: Medicare Other | Source: Ambulatory Visit | Attending: Cardiovascular Disease | Admitting: Cardiovascular Disease

## 2020-08-19 DIAGNOSIS — Z01812 Encounter for preprocedural laboratory examination: Secondary | ICD-10-CM | POA: Diagnosis not present

## 2020-08-19 DIAGNOSIS — Z20822 Contact with and (suspected) exposure to covid-19: Secondary | ICD-10-CM | POA: Insufficient documentation

## 2020-08-19 LAB — SARS CORONAVIRUS 2 (TAT 6-24 HRS): SARS Coronavirus 2: NEGATIVE

## 2020-08-22 ENCOUNTER — Telehealth: Payer: Self-pay | Admitting: *Deleted

## 2020-08-22 DIAGNOSIS — D2261 Melanocytic nevi of right upper limb, including shoulder: Secondary | ICD-10-CM | POA: Diagnosis not present

## 2020-08-22 DIAGNOSIS — D2262 Melanocytic nevi of left upper limb, including shoulder: Secondary | ICD-10-CM | POA: Diagnosis not present

## 2020-08-22 DIAGNOSIS — L570 Actinic keratosis: Secondary | ICD-10-CM | POA: Diagnosis not present

## 2020-08-22 DIAGNOSIS — D485 Neoplasm of uncertain behavior of skin: Secondary | ICD-10-CM | POA: Diagnosis not present

## 2020-08-22 DIAGNOSIS — D2272 Melanocytic nevi of left lower limb, including hip: Secondary | ICD-10-CM | POA: Diagnosis not present

## 2020-08-22 DIAGNOSIS — D225 Melanocytic nevi of trunk: Secondary | ICD-10-CM | POA: Diagnosis not present

## 2020-08-22 DIAGNOSIS — Z85828 Personal history of other malignant neoplasm of skin: Secondary | ICD-10-CM | POA: Diagnosis not present

## 2020-08-22 DIAGNOSIS — L821 Other seborrheic keratosis: Secondary | ICD-10-CM | POA: Diagnosis not present

## 2020-08-22 DIAGNOSIS — D0439 Carcinoma in situ of skin of other parts of face: Secondary | ICD-10-CM | POA: Diagnosis not present

## 2020-08-22 NOTE — Telephone Encounter (Addendum)
Pt contacted pre-abdominal aortogram scheduled at [facility] for: Wednesday August 23, 2020 10:30 AM Verified arrival time and place: [facility]) at: 5:30 AM-pre procedure hydration   No solid food after midnight prior to cath, clear liquids until 5 AM day of procedure.  Hold: Pradaxa-none 08/21/20 until post procedure Lasix-AM of procedure-GFR 30 Glipizide-AM of procedure Insulin-AM of procedure/1/2 usual dose HS prior to procedure  Except hold medications AM meds can be  taken pre-cath with sips of water including: ASA 81 mg   Confirmed patient has responsible adult to drive home post procedure and be with patient first 24 hours after arriving home: yes  You are allowed ONE visitor in the waiting room during the time you are at the hospital for your procedure. Both you and your visitor must wear a mask once you enter the hospital.       COVID-19 Pre-Screening Questions:  . In the past 14 days have you had a new cough, new headache, new nasal congestion, fever (100.4 or greater) unexplained body aches, new sore throat, or sudden loss of taste or sense of smell? no . In the past 14 days have you been around anyone with known Covid 19? no . Have you been vaccinated for COVID-19? Yes, see immunization history  Received procedure/mask/visitor instructions, COVID-19 questions with patient.

## 2020-08-23 ENCOUNTER — Ambulatory Visit (HOSPITAL_COMMUNITY)
Admission: RE | Admit: 2020-08-23 | Discharge: 2020-08-24 | Disposition: A | Discharge status: Home / Self Care | Payer: Medicare Other | Source: Ambulatory Visit | Attending: Cardiovascular Disease | Admitting: Cardiovascular Disease

## 2020-08-23 ENCOUNTER — Encounter (HOSPITAL_COMMUNITY)
Admission: RE | Disposition: A | Discharge status: Home / Self Care | Payer: Self-pay | Source: Ambulatory Visit | Attending: Cardiovascular Disease

## 2020-08-23 ENCOUNTER — Other Ambulatory Visit: Payer: Self-pay | Admitting: *Deleted

## 2020-08-23 ENCOUNTER — Other Ambulatory Visit: Payer: Self-pay

## 2020-08-23 DIAGNOSIS — I5032 Chronic diastolic (congestive) heart failure: Secondary | ICD-10-CM | POA: Insufficient documentation

## 2020-08-23 DIAGNOSIS — G4733 Obstructive sleep apnea (adult) (pediatric): Secondary | ICD-10-CM | POA: Diagnosis not present

## 2020-08-23 DIAGNOSIS — I130 Hypertensive heart and chronic kidney disease with heart failure and stage 1 through stage 4 chronic kidney disease, or unspecified chronic kidney disease: Secondary | ICD-10-CM | POA: Diagnosis not present

## 2020-08-23 DIAGNOSIS — E1151 Type 2 diabetes mellitus with diabetic peripheral angiopathy without gangrene: Secondary | ICD-10-CM | POA: Insufficient documentation

## 2020-08-23 DIAGNOSIS — Z79899 Other long term (current) drug therapy: Secondary | ICD-10-CM | POA: Diagnosis not present

## 2020-08-23 DIAGNOSIS — Z89512 Acquired absence of left leg below knee: Secondary | ICD-10-CM | POA: Diagnosis not present

## 2020-08-23 DIAGNOSIS — E1122 Type 2 diabetes mellitus with diabetic chronic kidney disease: Secondary | ICD-10-CM | POA: Insufficient documentation

## 2020-08-23 DIAGNOSIS — Z950 Presence of cardiac pacemaker: Secondary | ICD-10-CM | POA: Diagnosis not present

## 2020-08-23 DIAGNOSIS — I739 Peripheral vascular disease, unspecified: Secondary | ICD-10-CM

## 2020-08-23 DIAGNOSIS — E785 Hyperlipidemia, unspecified: Secondary | ICD-10-CM | POA: Diagnosis not present

## 2020-08-23 DIAGNOSIS — I2510 Atherosclerotic heart disease of native coronary artery without angina pectoris: Secondary | ICD-10-CM | POA: Insufficient documentation

## 2020-08-23 DIAGNOSIS — I480 Paroxysmal atrial fibrillation: Secondary | ICD-10-CM | POA: Insufficient documentation

## 2020-08-23 DIAGNOSIS — Z794 Long term (current) use of insulin: Secondary | ICD-10-CM | POA: Insufficient documentation

## 2020-08-23 DIAGNOSIS — Z952 Presence of prosthetic heart valve: Secondary | ICD-10-CM | POA: Insufficient documentation

## 2020-08-23 DIAGNOSIS — Z7902 Long term (current) use of antithrombotics/antiplatelets: Secondary | ICD-10-CM | POA: Insufficient documentation

## 2020-08-23 DIAGNOSIS — I442 Atrioventricular block, complete: Secondary | ICD-10-CM | POA: Insufficient documentation

## 2020-08-23 DIAGNOSIS — I70211 Atherosclerosis of native arteries of extremities with intermittent claudication, right leg: Secondary | ICD-10-CM | POA: Diagnosis not present

## 2020-08-23 DIAGNOSIS — Z6833 Body mass index (BMI) 33.0-33.9, adult: Secondary | ICD-10-CM | POA: Diagnosis not present

## 2020-08-23 DIAGNOSIS — N189 Chronic kidney disease, unspecified: Secondary | ICD-10-CM | POA: Insufficient documentation

## 2020-08-23 DIAGNOSIS — I70229 Atherosclerosis of native arteries of extremities with rest pain, unspecified extremity: Secondary | ICD-10-CM | POA: Diagnosis present

## 2020-08-23 DIAGNOSIS — Z79890 Hormone replacement therapy (postmenopausal): Secondary | ICD-10-CM | POA: Insufficient documentation

## 2020-08-23 DIAGNOSIS — I350 Nonrheumatic aortic (valve) stenosis: Secondary | ICD-10-CM | POA: Insufficient documentation

## 2020-08-23 DIAGNOSIS — I70221 Atherosclerosis of native arteries of extremities with rest pain, right leg: Secondary | ICD-10-CM | POA: Diagnosis not present

## 2020-08-23 HISTORY — PX: PERIPHERAL VASCULAR ATHERECTOMY: CATH118256

## 2020-08-23 HISTORY — PX: ABDOMINAL AORTOGRAM W/LOWER EXTREMITY: CATH118223

## 2020-08-23 LAB — POCT ACTIVATED CLOTTING TIME
Activated Clotting Time: 208 seconds
Activated Clotting Time: 224 seconds

## 2020-08-23 LAB — GLUCOSE, CAPILLARY
Glucose-Capillary: 174 mg/dL — ABNORMAL HIGH (ref 70–99)
Glucose-Capillary: 155 mg/dL — ABNORMAL HIGH (ref 70–99)
Glucose-Capillary: 147 mg/dL — ABNORMAL HIGH (ref 70–99)
Glucose-Capillary: 141 mg/dL — ABNORMAL HIGH (ref 70–99)
Glucose-Capillary: 314 mg/dL — ABNORMAL HIGH (ref 70–99)
Glucose-Capillary: 309 mg/dL — ABNORMAL HIGH (ref 70–99)
Glucose-Capillary: 239 mg/dL — ABNORMAL HIGH (ref 70–99)

## 2020-08-23 SURGERY — ABDOMINAL AORTOGRAM W/LOWER EXTREMITY
Anesthesia: LOCAL

## 2020-08-23 MED ORDER — FENOFIBRIC ACID 135 MG PO CPDR: 135.0000 mg | DELAYED_RELEASE_CAPSULE | Freq: Every day | ORAL | Status: DC

## 2020-08-23 MED ORDER — SODIUM CHLORIDE 0.9% FLUSH: 3.0000 mL | INTRAVENOUS | Status: DC | PRN

## 2020-08-23 MED ORDER — FENOFIBRATE 160 MG PO TABS
160.0000 mg | ORAL_TABLET | Freq: Every day | ORAL | Status: DC
  Administered 2020-08-24: 160 mg via ORAL
  Filled 2020-08-23: qty 1

## 2020-08-23 MED ORDER — LABETALOL HCL 5 MG/ML IV SOLN: 10.0000 mg | INTRAVENOUS | Status: DC | PRN

## 2020-08-23 MED ORDER — SODIUM CHLORIDE 0.9% FLUSH
3.0000 mL | Freq: Two times a day (BID) | INTRAVENOUS | Status: DC
  Administered 2020-08-24: 3 mL via INTRAVENOUS

## 2020-08-23 MED ORDER — CLOPIDOGREL BISULFATE 75 MG PO TABS
75.0000 mg | ORAL_TABLET | Freq: Every day | ORAL | Status: DC
  Administered 2020-08-24: 75 mg via ORAL
  Filled 2020-08-23: qty 1

## 2020-08-23 MED ORDER — VIPERSLIDE LUBRICANT OPTIME: TOPICAL | Status: DC | PRN

## 2020-08-23 MED ORDER — FENTANYL CITRATE (PF) 100 MCG/2ML IJ SOLN
INTRAMUSCULAR | Status: AC
Start: 2020-08-23 — End: ?
  Filled 2020-08-23: qty 2

## 2020-08-23 MED ORDER — MIDAZOLAM HCL 2 MG/2ML IJ SOLN
INTRAMUSCULAR | Status: AC
  Filled 2020-08-23: qty 2

## 2020-08-23 MED ORDER — HEPARIN SODIUM (PORCINE) 1000 UNIT/ML IJ SOLN
INTRAMUSCULAR | Status: DC | PRN
  Administered 2020-08-23: 2000 [IU] via INTRAVENOUS
  Administered 2020-08-23: 3000 [IU] via INTRAVENOUS
  Administered 2020-08-23: 8000 [IU] via INTRAVENOUS

## 2020-08-23 MED ORDER — LIDOCAINE HCL (PF) 1 % IJ SOLN
INTRAMUSCULAR | Status: AC
  Filled 2020-08-23: qty 30

## 2020-08-23 MED ORDER — ONDANSETRON HCL 4 MG/2ML IJ SOLN: 4.0000 mg | Freq: Four times a day (QID) | INTRAMUSCULAR | Status: DC | PRN

## 2020-08-23 MED ORDER — LEVOTHYROXINE SODIUM 100 MCG PO TABS
200.0000 ug | ORAL_TABLET | Freq: Every day | ORAL | Status: DC
  Administered 2020-08-24: 200 ug via ORAL
  Filled 2020-08-23: qty 2

## 2020-08-23 MED ORDER — NITROGLYCERIN 1 MG/10 ML FOR IR/CATH LAB
INTRA_ARTERIAL | Status: DC | PRN
  Administered 2020-08-23 (×2): 200 ug via INTRA_ARTERIAL

## 2020-08-23 MED ORDER — EZETIMIBE 10 MG PO TABS
10.0000 mg | ORAL_TABLET | Freq: Every day | ORAL | Status: DC
  Administered 2020-08-24: 10 mg via ORAL
  Filled 2020-08-23: qty 1

## 2020-08-23 MED ORDER — VERAPAMIL HCL 2.5 MG/ML IV SOLN
INTRAVENOUS | Status: AC
  Filled 2020-08-23: qty 2

## 2020-08-23 MED ORDER — GLIPIZIDE ER 10 MG PO TB24
10.0000 mg | ORAL_TABLET | Freq: Every day | ORAL | Status: DC
  Administered 2020-08-24: 10 mg via ORAL
  Filled 2020-08-23: qty 1

## 2020-08-23 MED ORDER — NITROGLYCERIN IN D5W 200-5 MCG/ML-% IV SOLN
INTRAVENOUS | Status: AC
  Filled 2020-08-23: qty 250

## 2020-08-23 MED ORDER — SODIUM CHLORIDE 0.9 % IV SOLN: 250.0000 mL | INTRAVENOUS | Status: DC | PRN

## 2020-08-23 MED ORDER — INSULIN GLARGINE 100 UNIT/ML ~~LOC~~ SOLN
26.0000 [IU] | Freq: Every day | SUBCUTANEOUS | Status: DC
  Administered 2020-08-23: 26 [IU] via SUBCUTANEOUS
  Filled 2020-08-23 (×2): qty 0.26

## 2020-08-23 MED ORDER — METOPROLOL TARTRATE 50 MG PO TABS
75.0000 mg | ORAL_TABLET | Freq: Two times a day (BID) | ORAL | Status: DC
  Administered 2020-08-23 – 2020-08-24 (×2): 75 mg via ORAL
  Filled 2020-08-23 (×2): qty 1

## 2020-08-23 MED ORDER — MIDAZOLAM HCL 2 MG/2ML IJ SOLN
INTRAMUSCULAR | Status: DC | PRN
  Administered 2020-08-23: 1 mg via INTRAVENOUS
  Administered 2020-08-23: 2 mg via INTRAVENOUS
  Administered 2020-08-23: 1 mg via INTRAVENOUS

## 2020-08-23 MED ORDER — HEPARIN SODIUM (PORCINE) 1000 UNIT/ML IJ SOLN
INTRAMUSCULAR | Status: AC
  Filled 2020-08-23: qty 1

## 2020-08-23 MED ORDER — SODIUM CHLORIDE 0.9 % IV SOLN: INTRAVENOUS | Status: DC

## 2020-08-23 MED ORDER — HEPARIN (PORCINE) IN NACL 1000-0.9 UT/500ML-% IV SOLN
INTRAVENOUS | Status: AC
  Filled 2020-08-23: qty 1000

## 2020-08-23 MED ORDER — HEPARIN (PORCINE) IN NACL 1000-0.9 UT/500ML-% IV SOLN
INTRAVENOUS | Status: DC | PRN
  Administered 2020-08-23 (×2): 500 mL

## 2020-08-23 MED ORDER — FUROSEMIDE 20 MG PO TABS
20.0000 mg | ORAL_TABLET | Freq: Every day | ORAL | Status: DC
  Administered 2020-08-24: 20 mg via ORAL
  Filled 2020-08-23: qty 1

## 2020-08-23 MED ORDER — INSULIN ASPART 100 UNIT/ML ~~LOC~~ SOLN
0.0000 [IU] | Freq: Three times a day (TID) | SUBCUTANEOUS | Status: DC
  Administered 2020-08-23: 11 [IU] via SUBCUTANEOUS
  Administered 2020-08-24: 2 [IU] via SUBCUTANEOUS

## 2020-08-23 MED ORDER — FENTANYL CITRATE (PF) 100 MCG/2ML IJ SOLN
INTRAMUSCULAR | Status: DC | PRN
Start: 2020-08-23 — End: 2020-08-23
  Administered 2020-08-23 (×3): 50 ug via INTRAVENOUS

## 2020-08-23 MED ORDER — FENTANYL CITRATE (PF) 100 MCG/2ML IJ SOLN
INTRAMUSCULAR | Status: AC
  Filled 2020-08-23: qty 2

## 2020-08-23 MED ORDER — MIRABEGRON ER 25 MG PO TB24
25.0000 mg | ORAL_TABLET | Freq: Every day | ORAL | Status: DC
  Administered 2020-08-24: 25 mg via ORAL
  Filled 2020-08-23 (×2): qty 1

## 2020-08-23 MED ORDER — ASPIRIN 81 MG PO CHEW
81.0000 mg | CHEWABLE_TABLET | ORAL | Status: AC
  Administered 2020-08-23: 81 mg via ORAL
  Filled 2020-08-23: qty 1

## 2020-08-23 MED ORDER — ACETAMINOPHEN 325 MG PO TABS: 650.0000 mg | ORAL_TABLET | ORAL | Status: DC | PRN

## 2020-08-23 MED ORDER — INSULIN DEGLUDEC 200 UNIT/ML ~~LOC~~ SOPN: 26.0000 [IU] | PEN_INJECTOR | Freq: Every day | SUBCUTANEOUS | Status: DC

## 2020-08-23 MED ORDER — AMIODARONE HCL 200 MG PO TABS
200.0000 mg | ORAL_TABLET | Freq: Every day | ORAL | Status: DC
  Administered 2020-08-24: 200 mg via ORAL
  Filled 2020-08-23: qty 1

## 2020-08-23 MED ORDER — LIDOCAINE HCL (PF) 1 % IJ SOLN
INTRAMUSCULAR | Status: DC | PRN
  Administered 2020-08-23: 20 mL

## 2020-08-23 MED ORDER — CLOPIDOGREL BISULFATE 300 MG PO TABS
ORAL_TABLET | ORAL | Status: AC
  Filled 2020-08-23: qty 1

## 2020-08-23 MED ORDER — SODIUM CHLORIDE 0.9% FLUSH: 3.0000 mL | Freq: Two times a day (BID) | INTRAVENOUS | Status: DC

## 2020-08-23 MED ORDER — CLOPIDOGREL BISULFATE 300 MG PO TABS
ORAL_TABLET | ORAL | Status: DC | PRN
  Administered 2020-08-23: 300 mg via ORAL

## 2020-08-23 MED ORDER — DOCUSATE SODIUM 100 MG PO CAPS
100.0000 mg | ORAL_CAPSULE | Freq: Every day | ORAL | Status: DC
  Administered 2020-08-24: 100 mg via ORAL
  Filled 2020-08-23 (×2): qty 1

## 2020-08-23 MED ORDER — IODIXANOL 320 MG/ML IV SOLN
INTRAVENOUS | Status: DC | PRN
  Administered 2020-08-23: 130 mL via INTRA_ARTERIAL

## 2020-08-23 MED ORDER — SODIUM CHLORIDE 0.9 % IV SOLN: INTRAVENOUS | Status: AC

## 2020-08-23 SURGICAL SUPPLY — 33 items
BAG SNAP BAND KOVER 36X36 (MISCELLANEOUS) ×3 IMPLANT
BALLN JADE .018 5.0 X 150 (BALLOONS) ×3
BALLOON JADE .018 5.0 X 150 (BALLOONS) ×2 IMPLANT
CATH ANGIO 5F PIGTAIL 65CM (CATHETERS) ×3 IMPLANT
CATH CROSS OVER TEMPO 5F (CATHETERS) ×3 IMPLANT
CATH QUICKCROSS .018X135CM (MICROCATHETER) ×3 IMPLANT
CATH STRAIGHT 5FR 65CM (CATHETERS) ×3 IMPLANT
DCB RANGER 5.0X200 150 (BALLOONS) ×2 IMPLANT
DEVICE CLOSURE MYNXGRIP 6/7F (Vascular Products) ×3 IMPLANT
DEVICE EMBOSHIELD NAV6 2.5-4.8 (FILTER) ×3 IMPLANT
DIAMONDBACK SOLID OAS 1.5MM (CATHETERS) ×3
GUIDEWIRE ZILIENT 12G 018 (WIRE) ×3 IMPLANT
KIT ENCORE 26 ADVANTAGE (KITS) ×3 IMPLANT
KIT MICROPUNCTURE NIT STIFF (SHEATH) ×3 IMPLANT
KIT PV (KITS) ×3 IMPLANT
LUBRICANT VIPERSLIDE CORONARY (MISCELLANEOUS) ×3 IMPLANT
RANGER DCB 5.0X200 150 (BALLOONS) ×3
SHEATH PINNACLE 5F 10CM (SHEATH) ×3 IMPLANT
SHEATH PINNACLE 7F 10CM (SHEATH) ×3 IMPLANT
SHEATH PINNACLE ST 7F 65CM (SHEATH) ×3 IMPLANT
SHEATH PROBE COVER 6X72 (BAG) ×3 IMPLANT
SHIELD RADPAD SCOOP 12X17 (MISCELLANEOUS) ×3 IMPLANT
STOPCOCK MORSE 400PSI 3WAY (MISCELLANEOUS) ×3 IMPLANT
SYR MEDRAD MARK 7 150ML (SYRINGE) ×3 IMPLANT
SYSTEM DIMNDBCK SLD OAS 1.5MM (CATHETERS) ×2 IMPLANT
TAPE VIPERTRACK RADIOPAQ (MISCELLANEOUS) ×2 IMPLANT
TAPE VIPERTRACK RADIOPAQUE (MISCELLANEOUS) ×1
TRANSDUCER W/STOPCOCK (MISCELLANEOUS) ×3 IMPLANT
TRAY PV CATH (CUSTOM PROCEDURE TRAY) ×3 IMPLANT
TUBING CIL FLEX 10 FLL-RA (TUBING) ×3 IMPLANT
WIRE G V18X300CM (WIRE) ×3 IMPLANT
WIRE HITORQ VERSACORE ST 145CM (WIRE) ×3 IMPLANT
WIRE VIPER ADVANCE .017X335CM (WIRE) ×3 IMPLANT

## 2020-08-23 NOTE — Interval H&P Note (Signed)
History and Physical Interval Note:  08/23/2020 11:22 AM  Steven Maldonado  has presented today for surgery, with the diagnosis of pad.  The various methods of treatment have been discussed with the patient and family. After consideration of risks, benefits and other options for treatment, the patient has consented to  Procedure(s): ABDOMINAL AORTOGRAM W/LOWER EXTREMITY (N/A) as a surgical intervention.  The patient's history has been reviewed, patient examined, no change in status, stable for surgery.  I have reviewed the patient's chart and labs.  Questions were answered to the patient's satisfaction.     [name]

## 2020-08-23 NOTE — Progress Notes (Signed)
Patient arrived from cath lab to 4e14. Patient CHG completed and vital signs obtained. Will monitor patient. Left groin level 0 will monitor patient. Arabela Basaldua, Bettina Gavia RN

## 2020-08-23 NOTE — Progress Notes (Signed)
Pt has multiple blisters on arms from skin cancers that removed yesterday. Not open or draining.

## 2020-08-24 ENCOUNTER — Encounter (HOSPITAL_COMMUNITY): Payer: Self-pay | Admitting: Cardiovascular Disease

## 2020-08-24 DIAGNOSIS — I442 Atrioventricular block, complete: Secondary | ICD-10-CM | POA: Diagnosis not present

## 2020-08-24 DIAGNOSIS — I480 Paroxysmal atrial fibrillation: Secondary | ICD-10-CM | POA: Diagnosis not present

## 2020-08-24 DIAGNOSIS — E1151 Type 2 diabetes mellitus with diabetic peripheral angiopathy without gangrene: Secondary | ICD-10-CM | POA: Diagnosis not present

## 2020-08-24 DIAGNOSIS — E785 Hyperlipidemia, unspecified: Secondary | ICD-10-CM | POA: Diagnosis not present

## 2020-08-24 DIAGNOSIS — I70211 Atherosclerosis of native arteries of extremities with intermittent claudication, right leg: Secondary | ICD-10-CM | POA: Diagnosis not present

## 2020-08-24 DIAGNOSIS — I70229 Atherosclerosis of native arteries of extremities with rest pain, unspecified extremity: Secondary | ICD-10-CM

## 2020-08-24 DIAGNOSIS — I350 Nonrheumatic aortic (valve) stenosis: Secondary | ICD-10-CM | POA: Diagnosis not present

## 2020-08-24 LAB — HEMOGLOBIN A1C
Hgb A1c MFr Bld: 8.8 % — ABNORMAL HIGH (ref 4.8–5.6)
Mean Plasma Glucose: 206 mg/dL

## 2020-08-24 LAB — GLUCOSE, CAPILLARY: Glucose-Capillary: 143 mg/dL — ABNORMAL HIGH (ref 70–99)

## 2020-08-24 MED ORDER — CLOPIDOGREL BISULFATE 75 MG PO TABS: 75.0000 mg | ORAL_TABLET | Freq: Every day | ORAL | 0 refills | Status: DC

## 2020-08-24 NOTE — Discharge Summary (Addendum)
Discharge Summary    Patient ID: Steven Maldonado MRN: [id number]; DOB: Jul 27, 1941  Admit date: 08/23/2020 Discharge date: 08/24/2020  Primary Care Provider: Lorrene Reid, [name]  Primary Cardiologist: [name], MD  Primary Electrophysiologist:  Will Meredith Leeds, MD  PV: Dr. Fletcher Anon   Discharge Diagnoses    Active Problems:   Critical lower limb ischemia ([place])   PAD   DM   HTN   HLD   CAD  Diagnostic Studies/Procedures    ABDOMINAL AORTOGRAM W/LOWER EXTREMITY  PERIPHERAL VASCULAR ATHERECTOMY  Conclusion  1.  No significant aortoiliac disease. 2 right lower extremity: Severe heavily calcified disease in the distal SFA, heavily calcified occluded popliteal artery above the knee, severe stenosis in the distal anterior tibial artery.  Occluded distal posterior tibial artery with severe proximal disease and occluded distal peroneal artery 3.  Successful orbital atherectomy and drug-coated balloon angioplasty to the right distal SFA and popliteal arteries.  Recommendations: Overnight hydration given underlying chronic kidney disease. Pradaxa can be resumed tomorrow if no bleeding issues from the left groin. We will use Plavix for 1 month without aspirin to minimize the risk of bleeding. The patient might require staged intervention on his tibial vessels if he continues to have symptoms.    History of Present Illness     Steven Maldonado is a 79 y.o. 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   His last cardiac cath performed November 2018 showed a 60% proximal LAD stenosis and known occluded distal RCA. He underwentsuccessful TAVR with a 29 mm Edwards Sapien THV via the R TF approachon 12/02/17. He developed  hemodynamically significant bradycardiaand syncope post operativelyand required PPM placement. Dr. Curt Bears implanted aMedtronic Azure XT DR MRI SureScan dual-chamber pacemaker(serial number [id number])on 12/04/17.  History of PAD status post right popliteal artery stenosis treated with drug-coated balloon angioplasty by Dr. Fletcher Anon July 2015.  He had a gangrenous left toe and underwent angiography by Dr. 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 Sharol Given and ultimately required a BKA.  He has had a nonhealing wound on his nailbed of his right first toe and evaluated by Dr. Gwenlyn Found 08/15/2020.   Lower extremity arterial Doppler studies performed 07/31/2020 that showed noncompressible right tibial vessels with high-grade disease in his distal right SFA and popliteal artery. Advised to hold Pradaxa for 2 day prior to PV angiography.   Hospital Course     Consultants: None  PV angiogram of right lower extremity showed evere heavily calcified disease in the distal SFA, heavily calcified occluded popliteal artery above the knee, severe stenosis in the distal anterior tibial artery.  Occluded distal posterior tibial artery with severe proximal disease and occluded distal peroneal artery/ S/p Successful orbital atherectomy and drug-coated balloon angioplasty to the right distal SFA and popliteal arteries. No complications. Hydrated overnight. Plan to use Plavix for 1 months without ASA. Resumed Pradaxa today, otherwise no change in his medical therapy.    The patient been seen by Dr. Angelena Form today and deemed ready for discharge home. All follow-up appointments have been scheduled. Discharge medications are listed below.   Did the patient have an acute coronary syndrome (MI, NSTEMI, STEMI,  etc) this admission?:  No                               Did the patient have a percutaneous  coronary intervention (stent / angioplasty)?:  No.   _____________  Discharge Vitals Blood pressure 94/68, pulse (!) 59, temperature 97.9 F (36.6 C), temperature source Oral, resp. rate 16, height 5\' 11"  (1.803 m), weight 108 kg, SpO2 98 %.  Filed Weights   08/23/20 0612  Weight: 108 kg   Physical Exam Constitutional:      Appearance: Normal appearance. He is obese.  HENT:     Head: Normocephalic and atraumatic.  Eyes:     Extraocular Movements: Extraocular movements intact.     Pupils: Pupils are equal, round, and reactive to light.  Cardiovascular:     Rate and Rhythm: Normal rate and regular rhythm.     Comments: L groin without hematoma  Pulmonary:     Effort: Pulmonary effort is normal.     Breath sounds: Normal breath sounds.  Abdominal:     General: Abdomen is flat.     Palpations: Abdomen is soft.  Musculoskeletal:        General: Normal range of motion.     Cervical back: Normal range of motion and neck supple.     Comments: S/p L BKA  Skin:    General: Skin is warm and dry.  Neurological:     General: No focal deficit present.     Mental Status: He is alert and oriented to person, place, and time.  Psychiatric:        Mood and Affect: Mood normal.        Behavior: Behavior normal.    Labs & Radiologic Studies    Hemoglobin A1C Recent Labs    08/23/20 1911  HGBA1C 8.8*   _____________  PERIPHERAL VASCULAR CATHETERIZATION  Result Date: 08/23/2020 1.  No significant aortoiliac disease. 2 right lower extremity: Severe heavily calcified disease in the distal SFA, heavily calcified occluded popliteal artery above the knee, severe stenosis in the distal anterior tibial artery.  Occluded distal posterior tibial artery with severe proximal disease and occluded distal peroneal artery 3.  Successful orbital atherectomy and drug-coated balloon angioplasty to the right distal SFA and popliteal arteries. Recommendations: Overnight hydration given underlying chronic  kidney disease. Pradaxa can be resumed tomorrow if no bleeding issues from the left groin. We will use Plavix for 1 month without aspirin to minimize the risk of bleeding. The patient might require staged intervention on his tibial vessels if he continues to have symptoms.  VAS [place] ABI WITH/WO TBI  Result Date: 08/03/2020 LOWER EXTREMITY DOPPLER STUDY Indications: Ulceration, peripheral artery disease, and left BKA and Patient has              a non healing sore of the right 1st toe for about 2-3 months. He              denies any pain of the right leg. He uses a cane to ambulate and              left BK prosthesis. High Risk Factors: Hypertension, hyperlipidemia, Diabetes, past history of                    smoking, coronary artery disease. Other Factors: SEE RIGHT LEG ARTERIAL DUPLEX REPORT.  Vascular Interventions: 04/09/18 Left SFA angioplasty with stent placement. Comparison  Study: Prior ABI 04/23/2019 Right N/C Left BKA Performing Technologist: Salvadore Dom RVT  Examination Guidelines: A complete evaluation includes at minimum, Doppler waveform signals and systolic blood pressure reading at the level of bilateral brachial, anterior tibial, and posterior tibial arteries, when vessel segments are accessible. Bilateral testing is considered an integral part of a complete examination. Photoelectric Plethysmograph (PPG) waveforms and toe systolic pressure readings are included as required and additional duplex testing as needed. Limited examinations for reoccurring indications may be performed as noted.  ABI Findings: +---------+-----------------+-----+-----------------------------------+--------+ Right    Rt Pressure      IndexWaveform                           Comment           (mmHg)                                                            +---------+-----------------+-----+-----------------------------------+--------+ Brachial 171                                                                +---------+-----------------+-----+-----------------------------------+--------+ ATA      254              1.49                                             +---------+-----------------+-----+-----------------------------------+--------+ PTA      49               0.29                                             +---------+-----------------+-----+-----------------------------------+--------+ PERO                           undetectable due to moderate                                               swelling                                    +---------+-----------------+-----+-----------------------------------+--------+ Great Toe                      Abnormal                                    +---------+-----------------+-----+-----------------------------------+--------+ +--------+------------------+-----+--------+-------+ Left    Lt Pressure (mmHg)IndexWaveformComment +--------+------------------+-----+--------+-------+ [id number]                                    +--------+------------------+-----+--------+-------+ +-------+-----------+-----------+------------+------------+  ABI/TBIToday's ABIToday's TBIPrevious ABIPrevious TBI +-------+-----------+-----------+------------+------------+ Right  [place]/.29     0          [place]          .12          +-------+-----------+-----------+------------+------------+ Left   BKA                   BKA                      +-------+-----------+-----------+------------+------------+  TOES Findings: +----------+---------------+--------+-------+ Right ToesPressure (mmHg)WaveformComment +----------+---------------+--------+-------+ 1st Digit                Abnormal        +----------+---------------+--------+-------+ 2nd Digit                Abnormal        +----------+---------------+--------+-------+ 3rd Digit                Abnormal        +----------+---------------+--------+-------+ 4th Digit                 Abnormal        +----------+---------------+--------+-------+ 5th Digit                Abnormal        +----------+---------------+--------+-------+   Arterial wall calcification precludes accurate ankle pressures and ABIs. Right ABIs appear essentially unchanged compared to prior study on 04/23/2019. Right TBIs appear decreased compared to prior study on 04/23/2019.  Summary: Right: Resting right ankle-brachial index indicates noncompressible right lower extremity arteries. ABIs are unreliable. Left: BKA.  *See table(s) above for measurements and observations.  Vascular consult recommended. Electronically signed by Carlyle Dolly MD on 08/03/2020 at 12:33:13 PM.    Final    VAS [place] LOWER EXTREMITY ARTERIAL DUPLEX  Result Date: 08/03/2020 LOWER EXTREMITY ARTERIAL DUPLEX STUDY Indications: Ulceration, peripheral artery disease, and Patient has a non              healing sore of the right 1st toe for about 2-3 months. He denies              any pain of the right leg. He uses a cane to ambulate and left BK              prosthesis and left BKA. High Risk Factors: Hypertension, hyperlipidemia, Diabetes, past history of                    smoking, coronary artery disease. Other Factors: SEE ABI REPORT.  Vascular Interventions: 04/09/18 Left SFA angioplasty with stent placement. Current ABI:            Right [place]-.29 PTA Left BKA Comparison Study: Right leg arterial duplex exam on 09/18/2015 showed highest                   velocity in the popiteal artery of 205 cm/s. Performing Technologist: Salvadore Dom RVT, RDCS (AE), RDMS  Examination Guidelines: A complete evaluation includes B-mode imaging, spectral Doppler, color Doppler, and power Doppler as needed of all accessible portions of each vessel. Bilateral testing is considered an integral part of a complete examination. Limited examinations for reoccurring indications may be performed as noted. Aorta:  +------+-------+----------+----------+----------+--------+-----+       AP (cm)Trans (cm)PSV (cm/s)Waveform  ThrombusShape +------+-------+----------+----------+----------+--------+-----+ Distal                 96  monophasic              +------+-------+----------+----------+----------+--------+-----+   +-----------+--------+-----+---------------+----------+------------------------+ RIGHT      PSV cm/sRatioStenosis       Waveform  Comments                 +-----------+--------+-----+---------------+----------+------------------------+ CIA Prox   109                         monophasic                         +-----------+--------+-----+---------------+----------+------------------------+ EIA Mid    99                          biphasic                           +-----------+--------+-----+---------------+----------+------------------------+ EIA Distal 98                          biphasic                           +-----------+--------+-----+---------------+----------+------------------------+ CFA Prox   84                          monophasic                         +-----------+--------+-----+---------------+----------+------------------------+ CFA Distal 59                          monophasic                         +-----------+--------+-----+---------------+----------+------------------------+ DFA        191                         biphasic                           +-----------+--------+-----+---------------+----------+------------------------+ SFA Prox   83                          monophasic                         +-----------+--------+-----+---------------+----------+------------------------+ SFA Mid    58                          monophasic                         +-----------+--------+-----+---------------+----------+------------------------+ SFA Distal 310          50-74% stenosismonophasic                          +-----------+--------+-----+---------------+----------+------------------------+ POP Prox   108                         monophasic                         +-----------+--------+-----+---------------+----------+------------------------+  POP Distal                                       shadowing; ? occluded vs                                                  higher grade stenosis                                                     within                   +-----------+--------+-----+---------------+----------+------------------------+ TP Trunk   26                          monophasic                         +-----------+--------+-----+---------------+----------+------------------------+ ATA Prox   41                          monophasic                         +-----------+--------+-----+---------------+----------+------------------------+ ATA Mid    29                          monophasic                         +-----------+--------+-----+---------------+----------+------------------------+ ATA Distal 43                          monophasic                         +-----------+--------+-----+---------------+----------+------------------------+ PTA Prox   27                          monophasic                         +-----------+--------+-----+---------------+----------+------------------------+ PTA Mid    21                          monophasic                         +-----------+--------+-----+---------------+----------+------------------------+ PTA Distal 27                          monophasic                         +-----------+--------+-----+---------------+----------+------------------------+ PERO Prox  19                          monophasic                         +-----------+--------+-----+---------------+----------+------------------------+  PERO Mid   24                          monophasic                          +-----------+--------+-----+---------------+----------+------------------------+ PERO Distal             occluded                                          +-----------+--------+-----+---------------+----------+------------------------+ A focal velocity elevation of 310 cm/s was obtained at DST SFA with a VR of 5.3. Findings are characteristic of 50-74% stenosis. Right pedal artery acceleration time of 279ms, is category 4 severe ischemia.  +----------+--------+-----+--------+--------+--------+ LEFT      PSV cm/sRatioStenosisWaveformComments +----------+--------+-----+--------+--------+--------+ CIA Prox  82                   biphasic         +----------+--------+-----+--------+--------+--------+ EIA Mid   99                   biphasic         +----------+--------+-----+--------+--------+--------+ EIA Distal105                  biphasic         +----------+--------+-----+--------+--------+--------+  Summary: Right: 50-74% stenosis noted in the superficial femoral artery. Total occlusion noted in the peroneal artery. Moderate to severe medial wall calcification throughout extremity. Shadowing in the distal popiteal artery, unable to determine occlusion versus  higher grade stenosis within. See note above regarding pedal artery. Aorta: Limited exam of the abdominal aorta and iliac arteries, afternoon non-fasting patient. Heavy calcified distal aorta seen. Patent appearing proximal common iliac arteries. See table(s) above for measurements and observations. Patient scheduled to see Dr. Gwenlyn Found on 08/15/2020 at 2:30 pm. Vascular consult recommended. Electronically signed by Carlyle Dolly MD on 08/03/2020 at 3:08:29 PM.    Final    Disposition   Pt is being discharged home today in good condition.  Follow-up Plans & Appointments     Follow-up Information    Wellington Hampshire, MD Follow up.   Specialty: Cardiology Why: office will call with date and time of appointment and study   Contact information: [address] [place] [address] [phone]              Discharge Instructions    Diet - low sodium heart healthy   Complete by: As directed    Increase activity slowly   Complete by: As directed       Discharge Medications   Allergies as of 08/24/2020   No Known Allergies     Medication List    TAKE these medications   amiodarone 200 MG tablet Commonly known as: PACERONE Take 200 mg by mouth daily.   BD Pen Needle Nano 2nd Gen 32G X 4 MM Misc Generic drug: Insulin Pen Needle USE 4 (FOUR) TIMES DAILY.   clopidogrel 75 MG tablet Commonly known as: PLAVIX Take 1 tablet (75 mg total) by mouth daily with breakfast. Start taking on: August 25, 2020   dabigatran 150 MG Caps capsule Commonly known as: Pradaxa Take 1 capsule (150 mg total) by mouth every 12 (twelve) hours. What changed: when to take this   docusate sodium 100 MG  capsule Commonly known as: COLACE Take 100 mg by mouth in the morning, at noon, and at bedtime.   Evolocumab 140 MG/ML Sosy Inject 140 mg into the skin every 14 (fourteen) days.   ezetimibe 10 MG tablet Commonly known as: ZETIA TAKE 1 TABLET BY MOUTH EVERY DAY   Fenofibric Acid 135 MG Cpdr Take 1 capsule by mouth daily. What changed: how much to take   FreeStyle Lite Devi 1 each by Does not apply route 2 (two) times daily.   FREESTYLE LITE test strip Generic drug: glucose blood 1 EACH BY OTHER ROUTE 4 (FOUR) TIMES DAILY - BEFORE MEALS AND AT BEDTIME.   furosemide 20 MG tablet Commonly known as: LASIX Take 1 tablet (20 mg total) by mouth daily.   glipiZIDE 10 MG 24 hr tablet Commonly known as: GLUCOTROL XL TAKE 1 TABLET (10 MG TOTAL) BY MOUTH DAILY WITH BREAKFAST.   insulin lispro 100 UNIT/ML KwikPen Commonly known as: HumaLOG KwikPen Inject 4-6 units under the skin up to three times daily if eating sweets or high carb foods. What changed:   how much to take  how to take  this  when to take this  additional instructions   levothyroxine 200 MCG tablet Commonly known as: SYNTHROID Take 1 tablet by mouth Monday-Saturday, and take 1/2 tablet on Sunday What changed:   how much to take  how to take this  when to take this  additional instructions   metoprolol tartrate 50 MG tablet Commonly known as: LOPRESSOR TAKE ONE AND ONE-HALF TABLET BY MOUTH TWICE A DAY What changed: See the new instructions.   mirabegron ER 25 MG Tb24 tablet Commonly known as: MYRBETRIQ Take 1 tablet (25 mg total) by mouth 4 (four) times a week. What changed: when to take this   mupirocin ointment 2 % Commonly known as: BACTROBAN Apply to great toe wound daily, with gauze and tape What changed:   how much to take  how to take this  when to take this   Ozempic (0.25 or 0.5 MG/DOSE) 2 MG/1.5ML Sopn Generic drug: Semaglutide(0.25 or 0.5MG /DOS) Inject 0.5 mg into the skin once a week.   pentoxifylline 400 MG CR tablet Commonly known as: TRENTAL Take 1 tablet (400 mg total) by mouth 3 (three) times daily with meals.   silver sulfADIAZINE 1 % cream Commonly known as: Silvadene Apply 1 application topically daily. What changed: when to take this   [place] FlexTouch 200 UNIT/ML FlexTouch Pen Generic drug: insulin degludec Inject 26 Units into the skin at bedtime.   Vitamin D (Ergocalciferol) 1.25 MG (50000 UNIT) Caps capsule Commonly known as: DRISDOL Take 1 capsule (50,000 Units total) by mouth every 7 (seven) days.          Outstanding Labs/Studies   N/A  Duration of Discharge Encounter   Greater than 30 minutes including physician time.  SignedLeanor Kail, PA 08/24/2020, 10:05 AM   I have personally seen and examined this patient. I agree with the assessment and plan as outlined above.  He is doing well today post peripheral intervention of the right lower extremity as outlined above. Will d/c home today with plans for Plavix for one  month along with Pradaxa. Follow up with Dr. Fletcher Anon.   [name] 08/24/2020 10:11 AM

## 2020-08-29 ENCOUNTER — Encounter (HOSPITAL_COMMUNITY): Payer: Medicare Other

## 2020-08-29 ENCOUNTER — Telehealth: Payer: Self-pay | Admitting: *Deleted

## 2020-08-29 NOTE — Telephone Encounter (Signed)
LEA 09/05/20-Dr Gwenlyn Found 09/12/20.

## 2020-08-29 NOTE — Telephone Encounter (Signed)
Per Dr Gwenlyn Found 08/28/20-Needs LEA this week the ROV with me in 2 weeks.

## 2020-09-05 ENCOUNTER — Other Ambulatory Visit: Payer: Self-pay

## 2020-09-05 ENCOUNTER — Ambulatory Visit (HOSPITAL_COMMUNITY)
Admission: RE | Admit: 2020-09-05 | Discharge: 2020-09-05 | Disposition: A | Discharge status: Home / Self Care | Payer: Medicare Other | Source: Ambulatory Visit | Attending: Cardiovascular Disease | Admitting: Cardiovascular Disease

## 2020-09-05 ENCOUNTER — Other Ambulatory Visit (HOSPITAL_COMMUNITY): Payer: Self-pay | Admitting: Cardiovascular Disease

## 2020-09-05 DIAGNOSIS — I739 Peripheral vascular disease, unspecified: Secondary | ICD-10-CM

## 2020-09-08 ENCOUNTER — Telehealth: Payer: Self-pay | Admitting: *Deleted

## 2020-09-08 NOTE — Telephone Encounter (Signed)
Pt aware that he will need to follow up with Dr. Curt Bears to further discuss ablation need. Pt scheduled for OV 11/18.  Ablation spot held for 12/15. Patient verbalized understanding and agreeable to plan.

## 2020-09-12 ENCOUNTER — Ambulatory Visit: Payer: Medicare Other | Admitting: Cardiovascular Disease

## 2020-09-12 NOTE — Telephone Encounter (Signed)
Patient is following up. He would like to go forward with scheduling ablation. Please call.

## 2020-09-12 NOTE — Telephone Encounter (Signed)
     Pt is calling back he said his afib is getting worst everyday and need to schedule ablation. He said to call his home phone#

## 2020-09-12 NOTE — Telephone Encounter (Signed)
Spoke to pt and his wife. Pt reports worsening AFib, DOE. Pt scheduled to see AFib clinic tomorrow to further evaluate.   Pt would like DCCV until such time as ablation can be performed.  Pt aware that we will further discuss ablation at 11/18 OV w/ Dr. Curt Bears, aware spot held for 12/15 (had to cancel previous ablation b/c pt undergoing vascular study). Patient verbalized understanding and agreeable to plan.

## 2020-09-13 ENCOUNTER — Other Ambulatory Visit: Payer: Self-pay

## 2020-09-13 ENCOUNTER — Ambulatory Visit (HOSPITAL_COMMUNITY)
Admission: RE | Admit: 2020-09-13 | Discharge: 2020-09-13 | Disposition: A | Discharge status: Home / Self Care | Payer: Medicare Other | Source: Ambulatory Visit | Attending: Nurse Practitioner | Admitting: Nurse Practitioner

## 2020-09-13 VITALS — BP 138/62 | HR 68 | Ht 71.0 in | Wt 242.2 lb

## 2020-09-13 DIAGNOSIS — Z79899 Other long term (current) drug therapy: Secondary | ICD-10-CM | POA: Insufficient documentation

## 2020-09-13 DIAGNOSIS — Z87891 Personal history of nicotine dependence: Secondary | ICD-10-CM | POA: Insufficient documentation

## 2020-09-13 DIAGNOSIS — I4819 Other persistent atrial fibrillation: Secondary | ICD-10-CM | POA: Insufficient documentation

## 2020-09-13 DIAGNOSIS — Z833 Family history of diabetes mellitus: Secondary | ICD-10-CM | POA: Insufficient documentation

## 2020-09-13 DIAGNOSIS — I110 Hypertensive heart disease with heart failure: Secondary | ICD-10-CM | POA: Insufficient documentation

## 2020-09-13 DIAGNOSIS — Z950 Presence of cardiac pacemaker: Secondary | ICD-10-CM | POA: Diagnosis not present

## 2020-09-13 DIAGNOSIS — Z8249 Family history of ischemic heart disease and other diseases of the circulatory system: Secondary | ICD-10-CM | POA: Insufficient documentation

## 2020-09-13 DIAGNOSIS — D6869 Other thrombophilia: Secondary | ICD-10-CM | POA: Diagnosis not present

## 2020-09-13 DIAGNOSIS — Z794 Long term (current) use of insulin: Secondary | ICD-10-CM | POA: Diagnosis not present

## 2020-09-13 DIAGNOSIS — G4733 Obstructive sleep apnea (adult) (pediatric): Secondary | ICD-10-CM | POA: Insufficient documentation

## 2020-09-13 DIAGNOSIS — I2510 Atherosclerotic heart disease of native coronary artery without angina pectoris: Secondary | ICD-10-CM | POA: Insufficient documentation

## 2020-09-13 DIAGNOSIS — I5032 Chronic diastolic (congestive) heart failure: Secondary | ICD-10-CM | POA: Insufficient documentation

## 2020-09-13 DIAGNOSIS — E1151 Type 2 diabetes mellitus with diabetic peripheral angiopathy without gangrene: Secondary | ICD-10-CM | POA: Diagnosis not present

## 2020-09-13 MED ORDER — FUROSEMIDE 20 MG PO TABS: 20.0000 mg | ORAL_TABLET | ORAL | Status: AC

## 2020-09-13 NOTE — Patient Instructions (Signed)
Amiodarone 200mg  twice a day until cardioversion then back to 200mg  once a day

## 2020-09-14 ENCOUNTER — Other Ambulatory Visit (HOSPITAL_COMMUNITY): Payer: Self-pay | Admitting: *Deleted

## 2020-09-14 ENCOUNTER — Encounter (HOSPITAL_COMMUNITY): Payer: Self-pay | Admitting: Nurse Practitioner

## 2020-09-14 NOTE — Progress Notes (Signed)
Primary Care Physician: Lorrene Reid, [name] Referring Physician: Dr. Jenene Slicker is a 79 y.o. 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 He was referred here to get scheduled for cardioversion. He is also scheduled to see Dr. Curt Bears later in November  to discuss ablation. He  Is on pradaxa for a CHA2DS2VASc of at least 6.  He is in a big hurry today as he left his wife in the car. So we will schedule this  and give details at home. He  is on amiodarone 200 mg daily and will increase this to 200 mg bid until the cardioversion. He did interrupt pradaxa  mid October for a vascular  study. He will be eligible for CV after 11/5. He had a successful orbital atherectomy and drug-coated balloon angioplasty to the right distal SFA and popliteal arteries 10/13.[name] Kitchen No complications. Hydrated overnight. Plan was  to use Plavix for 1 month  without ASA. He did miss pradaxa 10/11 tru 10/14 am, resumed Pradaxa 10/14,pm,  otherwise no change in his medical therapy.   Today, he denies symptoms of palpitations, chest pain, shortness of breath, orthopnea, PND, lower extremity edema, dizziness, presyncope, syncope, or neurologic sequela. The patient is tolerating medications without difficulties and is otherwise without complaint today.   Past Medical History:  Diagnosis Date  . Chronic diastolic CHF (congestive heart failure) ([place])   . CKD (chronic kidney disease), stage III ([place])   . Constipation   . Coronary artery disease    a. Cath February 2012 in [place] [facility], occluded RCA with collaterals  . DM type 2 (diabetes mellitus, type 2) ([place])   . Essential hypertension   . Hyperlipidemia   . Neuropathy    feet  . Pacemaker     a. symptomatic brady after TAVR s/p MDT PPM by Dr. Curt Bears 12/04/17  . Persistent atrial fibrillation ([place])   . PONV (postoperative nausea and vomiting)    after valve surgery  . PVD (peripheral vascular disease) ([place])    a. s/p R popliteal artery stenosis tx with drug-coated balloon 05/2014, followed by Dr. Fletcher Anon.  . 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 Severe aortic stenosis    a. 12/02/17: s/p TAVR  . Skin cancer   . Sleep apnea with use of continuous positive airway pressure (CPAP)    04-11-11 AHI was 32.9 and titrated to 15 cm H20, DME is [facility]  . Subclinical hypothyroidism    Past Surgical History:  Procedure Laterality Date  . ABDOMINAL ANGIOGRAM N/A 06/08/2014   Procedure: ABDOMINAL ANGIOGRAM;  Surgeon: Wellington Hampshire, MD;  Location: [facility] CATH LAB;  Service: Cardiovascular;  Laterality: N/A;  . ABDOMINAL AORTOGRAM N/A 04/09/2018   Procedure: ABDOMINAL AORTOGRAM;  Surgeon: Conrad [place]ant[place], MD;  Location: [place] CV LAB;  Service: Cardiovascular;  Laterality: N/A;  . ABDOMINAL AORTOGRAM W/LOWER EXTREMITY N/A 08/23/2020   Procedure: ABDOMINAL AORTOGRAM W/LOWER EXTREMITY;  Surgeon: Wellington Hampshire, MD;  Location: [place] CV LAB;  Service: Cardiovascular;  Laterality: N/A;  . AMPUTATION Left 04/17/2018   Procedure: LEFT FOOT 3RD RAY AMPUTATION;  Surgeon: Newt Minion, MD;  Location: [place];  Service: Orthopedics;  Laterality: Left;  . AMPUTATION Left 05/28/2018   Procedure: LEFT AMPUTATION BELOW KNEE;  Surgeon: Wylene Simmer, MD;  Location: [place];  Service: Orthopedics;  Laterality: Left;  . APPENDECTOMY  1965  . 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 CARDIAC CATHETERIZATION  12/2010  . CARDIOVERSION  07/2011  . CARDIOVERSION N/A 04/18/2014   Procedure: CARDIOVERSION;  Surgeon: Dorothy Spark, MD;  Location: [place];  Service: Cardiovascular;   Laterality: N/A;  . CARDIOVERSION N/A 11/03/2015   Procedure: CARDIOVERSION;  Surgeon: Lelon Perla, MD;  Location: [facility] ENDOSCOPY;  Service: Cardiovascular;  Laterality: N/A;  . CARDIOVERSION N/A 05/08/2017   Procedure: CARDIOVERSION;  Surgeon: Dorothy Spark, MD;  Location: [facility] ENDOSCOPY;  Service: Cardiovascular;  Laterality: N/A;  . CARDIOVERSION N/A 07/28/2017   Procedure: CARDIOVERSION;  Surgeon: Dorothy Spark, MD;  Location: [facility] ENDOSCOPY;  Service: Cardiovascular;  Laterality: N/A;  . CARDIOVERSION N/A 02/08/2020   Procedure: CARDIOVERSION;  Surgeon: Buford Dresser, MD;  Location: [facility] ENDOSCOPY;  Service: Cardiovascular;  Laterality: N/A;  . CARDIOVERSION N/A 03/15/2020   Procedure: CARDIOVERSION;  Surgeon: Acie Fredrickson Wonda Cheng, MD;  Location: [place];  Service: Cardiovascular;  Laterality: N/A;  . LOWER EXTREMITY ANGIOGRAM N/A 06/08/2014   Procedure: LOWER EXTREMITY ANGIOGRAM;  Surgeon: Wellington Hampshire, MD;  Location: [facility] CATH LAB;  Service: Cardiovascular;  Laterality: N/A;  . LOWER EXTREMITY ANGIOGRAPHY Left 04/09/2018   Procedure: Lower Extremity Angiography;  Surgeon: Conrad [place], MD;  Location: [place] CV LAB;  Service: Cardiovascular;  Laterality: Left;  . PACEMAKER IMPLANT N/A 12/04/2017   Procedure: PACEMAKER IMPLANT;  Surgeon: Constance Haw, MD;  Location: [place] CV LAB;  Service: Cardiovascular;  Laterality: N/A;  . PERIPHERAL VASCULAR ATHERECTOMY  08/23/2020   Procedure: PERIPHERAL VASCULAR ATHERECTOMY;  Surgeon: Wellington Hampshire, MD;  Location: [place] CV LAB;  Service: Cardiovascular;;  . PERIPHERAL VASCULAR BALLOON ANGIOPLASTY Left 04/09/2018   Procedure: PERIPHERAL VASCULAR BALLOON ANGIOPLASTY;  Surgeon: Conrad [place], MD;  Location: [place] CV LAB;  Service: Cardiovascular;  Laterality: Left;  SFA  . POPLITEAL ARTERY ANGIOPLASTY Right 06/08/2014   Archie Endo 06/08/2014  . RIGHT/LEFT HEART CATH AND CORONARY ANGIOGRAPHY N/A 10/08/2017    Procedure: RIGHT/LEFT HEART CATH AND CORONARY ANGIOGRAPHY;  Surgeon: Burnell Blanks, MD;  Location: [place] CV LAB;  Service: Cardiovascular;  Laterality: N/A;  . SKIN CANCER EXCISION Bilateral    "have had them cut off back of neck X 2; off left upper arm; right wrist, near right shoulder blade" (06/08/2014)  . TEE WITHOUT CARDIOVERSION N/A 12/02/2017   Procedure: TRANSESOPHAGEAL ECHOCARDIOGRAM (TEE);  Surgeon: Burnell Blanks, MD;  Location: [place];  Service: Open Heart Surgery;  Laterality: N/A;  . TEMPORARY PACEMAKER N/A 12/04/2017   Procedure: TEMPORARY PACEMAKER;  Surgeon: Leonie Man, MD;  Location: [place] CV LAB;  Service: Cardiovascular;  Laterality: N/A;  . TRANSCATHETER AORTIC VALVE REPLACEMENT, TRANSFEMORAL N/A 12/02/2017   Procedure: TRANSCATHETER AORTIC VALVE REPLACEMENT, TRANSFEMORAL;  Surgeon: Burnell Blanks, MD;  Location: [place];  Service: Open Heart Surgery;  Laterality: N/A;  using Edwards Sapien 3 Transcatheter Heart Valve size 32mm    Current Outpatient Medications  Medication Sig Dispense Refill  . amiodarone (PACERONE) 200 MG tablet Take 200 mg by mouth daily.    . BD PEN NEEDLE NANO 2ND GEN 32G X 4 MM MISC USE 4 (FOUR) TIMES DAILY. 200 each 2  .  Blood Glucose Monitoring Suppl (FREESTYLE LITE) DEVI 1 each by Does not apply route 2 (two) times daily. 1 each 0  . Choline Fenofibrate (FENOFIBRIC ACID) 135 MG CPDR Take 1 capsule by mouth daily. (Patient taking differently: Take 135 mg by mouth daily. ) 90 capsule 0  . clopidogrel (PLAVIX) 75 MG tablet Take 1 tablet (75 mg total) by mouth daily with breakfast. 30 tablet 0  . dabigatran (PRADAXA) 150 MG CAPS capsule Take 1 capsule (150 mg total) by mouth every 12 (twelve) hours. (Patient taking differently: Take 150 mg by mouth 2 (two) times daily. ) 180 capsule 0  . docusate sodium (COLACE) 100 MG capsule Take 100 mg by mouth in the morning, at noon, and at bedtime.     . Evolocumab 140 MG/ML  SOSY Inject 140 mg into the skin every 14 (fourteen) days. 2.1 mL 2  . ezetimibe (ZETIA) 10 MG tablet TAKE 1 TABLET BY MOUTH EVERY DAY (Patient taking differently: Take 10 mg by mouth daily. ) 90 tablet 1  . FREESTYLE LITE test strip 1 EACH BY OTHER ROUTE 4 (FOUR) TIMES DAILY - BEFORE MEALS AND AT BEDTIME. 450 strip 3  . furosemide (LASIX) 20 MG tablet Take 1 tablet (20 mg total) by mouth every other day.    [name] Kitchen glipiZIDE (GLUCOTROL XL) 10 MG 24 hr tablet TAKE 1 TABLET (10 MG TOTAL) BY MOUTH DAILY WITH BREAKFAST. 90 tablet 0  . insulin degludec (TRESIBA FLEXTOUCH) 200 UNIT/ML FlexTouch Pen Inject 26 Units into the skin at bedtime. (Patient taking differently: Inject 26 Units into the skin at bedtime. ) 15 mL 1  . insulin lispro (HUMALOG KWIKPEN) 100 UNIT/ML KwikPen Inject 4-6 units under the skin up to three times daily if eating sweets or high carb foods. (Patient taking differently: Inject 6 Units into the skin in the morning, at noon, in the evening, and at bedtime. daily if eating sweets or high carb foods.) 30 mL 1  . levothyroxine (SYNTHROID) 200 MCG tablet Take 1 tablet by mouth Monday-Saturday, and take 1/2 tablet on Sunday (Patient taking differently: Take 200 mcg by mouth daily before breakfast. Take 200 mg tablet by mouth Monday-Saturday, and take 100 mg tablet on Sunday) 30 tablet 1  . metoprolol tartrate (LOPRESSOR) 50 MG tablet TAKE ONE AND ONE-HALF TABLET BY MOUTH TWICE A DAY (Patient taking differently: Take 75 mg by mouth 2 (two) times daily. ) 270 tablet 0  . mirabegron ER (MYRBETRIQ) 25 MG TB24 tablet Take 1 tablet (25 mg total) by mouth 4 (four) times a week. (Patient taking differently: Take 25 mg by mouth daily. ) 30 tablet 0  . mupirocin ointment (BACTROBAN) 2 % Apply to great toe wound daily, with gauze and tape (Patient taking differently: Apply 1 application topically every other day. Apply to great toe wound daily, with gauze and tape) 30 g 1  . pentoxifylline (TRENTAL) 400 MG CR  tablet Take 1 tablet (400 mg total) by mouth 3 (three) times daily with meals. 90 tablet 3  . Semaglutide,0.25 or 0.5MG /DOS, (OZEMPIC, 0.25 OR 0.5 MG/DOSE,) 2 MG/1.5ML SOPN Inject 0.5 mg into the skin once a week. 1 pen 2  . silver sulfADIAZINE (SILVADENE) 1 % cream Apply 1 application topically daily. (Patient taking differently: Apply 1 application topically every other day. ) 50 g 0   No current facility-administered medications for this encounter.    No Known Allergies  Social History   Socioeconomic History  . Marital status: Married  Spouse name: [name]  . Number of children: 1  . Years of education: 39  . Highest education level: Not on file  Occupational History  . Occupation: retired    Fish farm manager: [employer]  Tobacco Use  . Smoking status: Former Smoker    Packs/day: 2.00    Years: 14.00    Pack years: 28.00    Types: Cigarettes    Quit date: 11/11/1972    Years since quitting: 47.8  . Smokeless tobacco: Never Used  Vaping Use  . Vaping Use: Never used  Substance and Sexual Activity  . Alcohol use: No    Comment: quit in 1984  . Drug use: No  . Sexual activity: Not Currently  Other Topics Concern  . 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   Patient has one child and his wife has one child.   Patient is retired.   Patient has a high school education.   Patient is right-handed.   Patient drinks very little caffeine.   Social Determinants of Health   Financial Resource Strain:   . Difficulty of Paying Living Expenses: Not on file  Food Insecurity: No Food Insecurity  . Worried About Charity fundraiser in the Last Year: Never true  . Ran Out of Food in the Last Year: Never true  Transportation Needs: No Transportation Needs  . Lack of Transportation (Medical): No  . Lack of Transportation (Non-Medical): No  Physical Activity:   . Days of Exercise per Week: Not on file  . Minutes of  Exercise per Session: Not on file  Stress:   . Feeling of Stress : Not on file  Social Connections:   . Frequency of Communication with Friends and Family: Not on file  . Frequency of Social Gatherings with Friends and Family: Not on file  . Attends Religious Services: Not on file  . Active Member of Clubs or Organizations: Not on file  . Attends Archivist Meetings: Not on file  . Marital Status: Not on file  Intimate Partner Violence:   . Fear of Current or Ex-Partner: Not on file  . Emotionally Abused: Not on file  . Physically Abused: Not on file  . Sexually Abused: Not on file    Family History  Problem Relation Age of Onset  . Diabetes Mother   . Heart attack Mother   . Hypertension Mother   . Heart attack Father   . Heart failure Father   . Hypertension Father   . Diabetes Father   . Diabetes Sister   . Diabetes Brother   . Diabetes Other   . Diabetes Daughter        TYPE ll  . Heart Problems Daughter   . Hypertension Sister   . Hypertension Brother   . Stroke Brother     ROS- All systems are reviewed and negative except as per the HPI above  Physical Exam: Vitals:   09/13/20 1418  BP: 138/62  Pulse: 68  Weight: 109.9 kg  Height: 5\' 11"  (1.803 m)   Wt Readings from Last 3 Encounters:  09/13/20 109.9 kg  08/23/20 108 kg  08/15/20 112.5 kg    Labs: Lab Results  Component Value Date   NA 140 08/15/2020   K 4.6 08/15/2020   CL 103 08/15/2020   CO2 21 08/15/2020   GLUCOSE 242 (H) 08/15/2020   BUN 20 08/15/2020   CREATININE 1.40 (  H) 08/15/2020   CALCIUM 9.5 08/15/2020   MG 1.9 12/03/2017   Lab Results  Component Value Date   INR 1.06 04/06/2018   Lab Results  Component Value Date   CHOL 161 01/24/2020   HDL 34 (L) 01/24/2020   LDLCALC 106 (H) 01/24/2020   TRIG 113 01/24/2020     GEN- The patient is well appearing, alert and oriented x 3 today.   Head- normocephalic, atraumatic Eyes-  Sclera clear, conjunctiva pink Ears-  hearing intact Oropharynx- clear Neck- supple, no JVP Lymph- no cervical lymphadenopathy Lungs- Clear to ausculation bilaterally, normal work of breathing Heart- Regular rate and rhythm, no murmurs, rubs or gallops, PMI not laterally displaced GI- soft, NT, ND, + BS Extremities- no clubbing, cyanosis, or edema MS- no significant deformity or atrophy Skin- no rash or lesion Psych- euthymic mood, full affect Neuro- strength and sensation are intact  EKG-v paced rhythm at 68 bpm   Assessment and Plan: 1. Afib  Persistent since July Will scheduled for cardioversion per request of Dr. Macky Lower  office  This will be scheduled and pt will be contacted at home with details as he is in a huge hurry to get to his wife in the car, she chose to stay in the car and not come into clinic.  He will need labs here the am of the procedure.  He will increase amiodarone to 200 mg bid until time of cardioversion which has been scheduled for 11/10.  covid test scheduled  2. CHA2DS2VASc score of at least 6 He did miss a couple of days of pradaxa mid October for a vascular study  but will be 21 days consecutively as of 11/5 Reminded not to miss any doses going forward   He  will f/u with Dr. Curt Bears office 11/18  Butch Penny C. Thurza Kwiecinski, [facility] [address] [place], [place] [address] [phone]

## 2020-09-15 ENCOUNTER — Other Ambulatory Visit: Payer: Self-pay | Admitting: Physician Assistant

## 2020-09-18 ENCOUNTER — Other Ambulatory Visit (HOSPITAL_COMMUNITY)
Admission: RE | Admit: 2020-09-18 | Discharge: 2020-09-18 | Disposition: A | Discharge status: Home / Self Care | Payer: Medicare Other | Source: Ambulatory Visit | Attending: Internal Medicine | Admitting: Internal Medicine

## 2020-09-18 DIAGNOSIS — Z01812 Encounter for preprocedural laboratory examination: Secondary | ICD-10-CM | POA: Diagnosis not present

## 2020-09-18 DIAGNOSIS — Z20822 Contact with and (suspected) exposure to covid-19: Secondary | ICD-10-CM | POA: Diagnosis not present

## 2020-09-18 LAB — SARS CORONAVIRUS 2 (TAT 6-24 HRS): SARS Coronavirus 2: NEGATIVE

## 2020-09-19 ENCOUNTER — Ambulatory Visit (INDEPENDENT_AMBULATORY_CARE_PROVIDER_SITE_OTHER): Payer: Medicare Other | Admitting: Cardiovascular Disease

## 2020-09-19 ENCOUNTER — Encounter: Payer: Self-pay | Admitting: Cardiovascular Disease

## 2020-09-19 VITALS — BP 126/74 | HR 67 | Ht 71.0 in | Wt 250.0 lb

## 2020-09-19 DIAGNOSIS — I739 Peripheral vascular disease, unspecified: Secondary | ICD-10-CM

## 2020-09-19 DIAGNOSIS — I6523 Occlusion and stenosis of bilateral carotid arteries: Secondary | ICD-10-CM

## 2020-09-19 DIAGNOSIS — I4819 Other persistent atrial fibrillation: Secondary | ICD-10-CM

## 2020-09-19 DIAGNOSIS — I2510 Atherosclerotic heart disease of native coronary artery without angina pectoris: Secondary | ICD-10-CM | POA: Diagnosis not present

## 2020-09-19 DIAGNOSIS — E785 Hyperlipidemia, unspecified: Secondary | ICD-10-CM | POA: Diagnosis not present

## 2020-09-19 DIAGNOSIS — I350 Nonrheumatic aortic (valve) stenosis: Secondary | ICD-10-CM

## 2020-09-19 NOTE — Patient Instructions (Signed)
Medication Instructions:  STOP the Plavix on one month *If you need a refill on your cardiac medications before your next appointment, please call your pharmacy*   Lab Work: None ordered If you have labs (blood work) drawn today and your tests are completely normal, you will receive your results only by: [name] Kitchen MyChart Message (if you have MyChart) OR . A paper copy in the mail If you have any lab test that is abnormal or we need to change your treatment, we will call you to review the results.   Testing/Procedures: None ordered   Follow-Up: At [facility], you and your health needs are our priority.  As part of our continuing mission to provide you with exceptional heart care, we have created designated Provider Care Teams.  These Care Teams include your primary Cardiologist (physician) and Advanced Practice Providers (APPs -  Physician Assistants and Nurse Practitioners) who all work together to provide you with the care you need, when you need it.  We recommend signing up for the patient portal called "MyChart".  Sign up information is provided on this After Visit Summary.  MyChart is used to connect with patients for Virtual Visits (Telemedicine).  Patients are able to view lab/test results, encounter notes, upcoming appointments, etc.  Non-urgent messages can be sent to your provider as well.   To learn more about what you can do with MyChart, go to [email or web address].    Your next appointment:   3 month(s)  The format for your next appointment:   In Person  Provider:   [name], MD

## 2020-09-19 NOTE — Progress Notes (Signed)
Cardiology Office Note   Date:  09/19/2020   ID:  Rajah, Tagliaferro 01-17-1941, MRN [id number]  PCP:  Lorrene Reid, [name]  Cardiologist:  Dr. Angelena Form  No chief complaint on file.     History of Present Illness: Steven Maldonado is a 79 y.o. male who presents for  a followup visit regarding  PAD . 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 He is followed by me for peripheral arterial disease.  I performed successful angioplasty and drug-coated balloon angioplasty to the right popliteal artery in 2015.  He was subsequently seen by Dr. 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  He was seen recently by Dr. Gwenlyn Found for a small ulceration on the right great toe after he was seen by podiatry.  He had Doppler studies done which showed an occluded heavily calcified distal SFA/popliteal artery. I proceeded with angiography last month which showed no significant aortoiliac disease.  On the right side, there was severe heavily calcified stenosis in the distal SFA with heavily calcified occluded popliteal artery above the knee with severe stenosis affecting the distal anterior tibial artery, occluded distal posterior tibial artery and occluded distal peroneal artery.  I performed successful orbital atherectomy and drug-coated balloon angioplasty to the right distal SFA and popliteal arteries with excellent results.  The ulceration healed with improvement foot pain.  He is scheduled for cardioversion tomorrow.  Past Medical History:  Diagnosis Date  . Chronic diastolic CHF (congestive heart failure) ([place])   . CKD (chronic kidney disease), stage III ([place])   . Constipation   . Coronary artery disease    a. Cath February 2012 in [facility], occluded RCA with  collaterals  . DM type 2 (diabetes mellitus, type 2) ([place])   . Essential hypertension   . Hyperlipidemia   . Neuropathy    feet  . Pacemaker    a. symptomatic brady after TAVR s/p MDT PPM by Dr. Curt Bears 12/04/17  . Persistent atrial fibrillation ([place])   . PONV (postoperative nausea and vomiting)    after valve surgery  . PVD (peripheral vascular disease) ([place])    a. s/p R popliteal artery stenosis tx with drug-coated balloon 05/2014, followed by Dr. Fletcher Anon.  . 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 Severe aortic stenosis    a. 12/02/17: s/p TAVR  . Skin cancer   . Sleep apnea with use of continuous positive airway pressure (CPAP)    04-11-11 AHI was 32.9 and titrated to 15 cm H20, DME is [facility]  . Subclinical hypothyroidism     Past Surgical History:  Procedure Laterality Date  . ABDOMINAL ANGIOGRAM N/A 06/08/2014   Procedure: ABDOMINAL ANGIOGRAM;  Surgeon: Wellington Hampshire, MD;  Location: [facility] CATH LAB;  Service: Cardiovascular;  Laterality: N/A;  . ABDOMINAL AORTOGRAM N/A 04/09/2018   Procedure: ABDOMINAL AORTOGRAM;  Surgeon: Conrad [place], MD;  Location: [place] CV LAB;  Service: Cardiovascular;  Laterality: N/A;  . ABDOMINAL AORTOGRAM W/LOWER EXTREMITY N/A 08/23/2020   Procedure: ABDOMINAL AORTOGRAM W/LOWER EXTREMITY;  Surgeon: Wellington Hampshire, MD;  Location: [place] CV LAB;  Service: Cardiovascular;  Laterality: N/A;  . AMPUTATION Left 04/17/2018  Procedure: LEFT FOOT 3RD RAY AMPUTATION;  Surgeon: Newt Minion, MD;  Location: [place];  Service: Orthopedics;  Laterality: Left;  . AMPUTATION Left 05/28/2018   Procedure: LEFT AMPUTATION BELOW KNEE;  Surgeon: Wylene Simmer, MD;  Location: [place];  Service: Orthopedics;  Laterality: Left;  . APPENDECTOMY  1965  . 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 CARDIAC CATHETERIZATION  12/2010  . CARDIOVERSION   07/2011  . CARDIOVERSION N/A 04/18/2014   Procedure: CARDIOVERSION;  Surgeon: Dorothy Spark, MD;  Location: [place];  Service: Cardiovascular;  Laterality: N/A;  . CARDIOVERSION N/A 11/03/2015   Procedure: CARDIOVERSION;  Surgeon: Lelon Perla, MD;  Location: [facility] ENDOSCOPY;  Service: Cardiovascular;  Laterality: N/A;  . CARDIOVERSION N/A 05/08/2017   Procedure: CARDIOVERSION;  Surgeon: Dorothy Spark, MD;  Location: [facility] ENDOSCOPY;  Service: Cardiovascular;  Laterality: N/A;  . CARDIOVERSION N/A 07/28/2017   Procedure: CARDIOVERSION;  Surgeon: Dorothy Spark, MD;  Location: [facility] ENDOSCOPY;  Service: Cardiovascular;  Laterality: N/A;  . CARDIOVERSION N/A 02/08/2020   Procedure: CARDIOVERSION;  Surgeon: Buford Dresser, MD;  Location: [facility] ENDOSCOPY;  Service: Cardiovascular;  Laterality: N/A;  . CARDIOVERSION N/A 03/15/2020   Procedure: CARDIOVERSION;  Surgeon: Acie Fredrickson Wonda Cheng, MD;  Location: [place];  Service: Cardiovascular;  Laterality: N/A;  . LOWER EXTREMITY ANGIOGRAM N/A 06/08/2014   Procedure: LOWER EXTREMITY ANGIOGRAM;  Surgeon: Wellington Hampshire, MD;  Location: [facility] CATH LAB;  Service: Cardiovascular;  Laterality: N/A;  . LOWER EXTREMITY ANGIOGRAPHY Left 04/09/2018   Procedure: Lower Extremity Angiography;  Surgeon: Conrad [place], MD;  Location: [place] CV LAB;  Service: Cardiovascular;  Laterality: Left;  . PACEMAKER IMPLANT N/A 12/04/2017   Procedure: PACEMAKER IMPLANT;  Surgeon: Constance Haw, MD;  Location: [place] CV LAB;  Service: Cardiovascular;  Laterality: N/A;  . PERIPHERAL VASCULAR ATHERECTOMY  08/23/2020   Procedure: PERIPHERAL VASCULAR ATHERECTOMY;  Surgeon: Wellington Hampshire, MD;  Location: [place] CV LAB;  Service: Cardiovascular;;  . PERIPHERAL VASCULAR BALLOON ANGIOPLASTY Left 04/09/2018   Procedure: PERIPHERAL VASCULAR BALLOON ANGIOPLASTY;  Surgeon: Conrad [place], MD;  Location: [place] CV LAB;  Service: Cardiovascular;  Laterality:  Left;  SFA  . POPLITEAL ARTERY ANGIOPLASTY Right 06/08/2014   Archie Endo 06/08/2014  . RIGHT/LEFT HEART CATH AND CORONARY ANGIOGRAPHY N/A 10/08/2017   Procedure: RIGHT/LEFT HEART CATH AND CORONARY ANGIOGRAPHY;  Surgeon: Burnell Blanks, MD;  Location: [place] CV LAB;  Service: Cardiovascular;  Laterality: N/A;  . SKIN CANCER EXCISION Bilateral    "have had them cut off back of neck X 2; off left upper arm; right wrist, near right shoulder blade" (06/08/2014)  . TEE WITHOUT CARDIOVERSION N/A 12/02/2017   Procedure: TRANSESOPHAGEAL ECHOCARDIOGRAM (TEE);  Surgeon: Burnell Blanks, MD;  Location: [place];  Service: Open Heart Surgery;  Laterality: N/A;  . TEMPORARY PACEMAKER N/A 12/04/2017   Procedure: TEMPORARY PACEMAKER;  Surgeon: Leonie Man, MD;  Location: [place] CV LAB;  Service: Cardiovascular;  Laterality: N/A;  . TRANSCATHETER AORTIC VALVE REPLACEMENT, TRANSFEMORAL N/A 12/02/2017   Procedure: TRANSCATHETER AORTIC VALVE REPLACEMENT, TRANSFEMORAL;  Surgeon: Burnell Blanks, MD;  Location: [place];  Service: Open Heart Surgery;  Laterality: N/A;  using Edwards Sapien 3 Transcatheter Heart Valve size 54mm     Current Outpatient Medications  Medication Sig Dispense Refill  . amiodarone (PACERONE) 200 MG tablet Take 400 mg by mouth daily.     . BD PEN NEEDLE NANO 2ND GEN 32G X 4 MM  MISC USE 4 (FOUR) TIMES DAILY. 200 each 2  . Blood Glucose Monitoring Suppl (FREESTYLE LITE) DEVI 1 each by Does not apply route 2 (two) times daily. 1 each 0  . Choline Fenofibrate (FENOFIBRIC ACID) 135 MG CPDR Take 1 capsule by mouth daily. (Patient taking differently: Take 135 mg by mouth daily. ) 90 capsule 0  . dabigatran (PRADAXA) 150 MG CAPS capsule Take 1 capsule (150 mg total) by mouth every 12 (twelve) hours. (Patient taking differently: Take 150 mg by mouth 2 (two) times daily. ) 180 capsule 0  . docusate sodium (COLACE) 100 MG capsule Take 300 mg by mouth daily.     . Evolocumab  140 MG/ML SOSY Inject 140 mg into the skin every 14 (fourteen) days. 2.1 mL 2  . ezetimibe (ZETIA) 10 MG tablet TAKE 1 TABLET BY MOUTH EVERY DAY (Patient taking differently: Take 10 mg by mouth daily. ) 90 tablet 1  . FREESTYLE LITE test strip 1 EACH BY OTHER ROUTE 4 (FOUR) TIMES DAILY - BEFORE MEALS AND AT BEDTIME. 450 strip 3  . furosemide (LASIX) 20 MG tablet Take 1 tablet (20 mg total) by mouth every other day.    [name] Kitchen glipiZIDE (GLUCOTROL XL) 10 MG 24 hr tablet TAKE 1 TABLET (10 MG TOTAL) BY MOUTH DAILY WITH BREAKFAST. 90 tablet 0  . insulin degludec (TRESIBA FLEXTOUCH) 200 UNIT/ML FlexTouch Pen Inject 26 Units into the skin at bedtime. (Patient taking differently: Inject 26 Units into the skin at bedtime. ) 15 mL 1  . insulin lispro (HUMALOG KWIKPEN) 100 UNIT/ML KwikPen Inject 4-6 units under the skin up to three times daily if eating sweets or high carb foods. (Patient taking differently: Inject 4-6 Units into the skin in the morning, at noon, in the evening, and at bedtime. daily if eating sweets or high carb foods.) 30 mL 1  . levothyroxine (SYNTHROID) 200 MCG tablet Take 1 tablet by mouth Monday-Saturday, and take 1/2 tablet on Sunday (Patient taking differently: Take 100-200 mcg by mouth daily before breakfast. Take 1 tablet (200 mcg)  by mouth Monday-Saturday, and take 0.5 tablet (100 mcg) tablet on Sunday) 30 tablet 1  . metoprolol tartrate (LOPRESSOR) 50 MG tablet TAKE ONE AND ONE-HALF TABLET BY MOUTH TWICE A DAY (Patient taking differently: Take 75 mg by mouth 2 (two) times daily. ) 270 tablet 0  . mirabegron ER (MYRBETRIQ) 25 MG TB24 tablet Take 1 tablet (25 mg total) by mouth 4 (four) times a week. (Patient taking differently: Take 25 mg by mouth daily. ) 30 tablet 0  . mupirocin ointment (BACTROBAN) 2 % Apply to great toe wound daily, with gauze and tape (Patient taking differently: Apply 1 application topically every other day. Apply to great toe wound daily, with gauze and tape) 30 g 1  .  Semaglutide,0.25 or 0.5MG /DOS, (OZEMPIC, 0.25 OR 0.5 MG/DOSE,) 2 MG/1.5ML SOPN Inject 0.5 mg into the skin once a week. 1 pen 2   No current facility-administered medications for this visit.    Allergies:   Patient has no known allergies.    Social History:  The patient  reports that he quit smoking about 47 years ago. His smoking use included cigarettes. He has a 28.00 pack-year smoking history. He has never used smokeless tobacco. He reports that he does not drink alcohol and does not use drugs.   Family History:  The patient's family history includes Diabetes in his brother, daughter, father, mother, sister, and another family member; Heart Problems in his  daughter; Heart attack in his father and mother; Heart failure in his father; Hypertension in his brother, father, mother, and sister; Stroke in his brother.    ROS:  Please see the history of present illness.   Otherwise, review of systems are positive for none.   All other systems are reviewed and negative.    PHYSICAL EXAM: VS:  BP 126/74 (BP Location: Left Arm, Patient Position: Sitting)   Pulse 67   Ht 5\' 11"  (1.803 m)   Wt 250 lb (113.4 kg)   SpO2 99%   BMI 34.87 kg/m  , BMI Body mass index is 34.87 kg/m. GEN: Well nourished, well developed, in no acute distress  HEENT: normal  Neck: no JVD, carotid bruits, or masses Cardiac: Irregularly irregular; no  rubs, or gallops,no edema . There is a 3/6 mid-late peaking aortic stenosis murmur Respiratory:  clear to auscultation bilaterally, normal work of breathing GI: soft, nontender, nondistended, + BS MS: no deformity or atrophy  Skin: warm and dry, no rash Neuro:  Strength and sensation are intact Psych: euthymic mood, full affect Vascular: Femoral pulses are normal.  There is a faint dorsalis pedis pulse on the right side.  EKG:  EKG is not ordered today.    Recent Labs: 04/20/2020: ALT 42 06/20/2020: TSH 2.94 08/15/2020: BUN 20; Creatinine, Ser 1.40; Hemoglobin 13.0;  Platelets 161; Potassium 4.6; Sodium 140    Lipid Panel    Component Value Date/Time   CHOL 161 01/24/2020 1045   TRIG 113 01/24/2020 1045   HDL 34 (L) 01/24/2020 1045   CHOLHDL 4.7 01/24/2020 1045   CHOLHDL 6.1 (H) 05/31/2019 1246   VLDL 43.8 (H) 08/18/2018 1500   LDLCALC 106 (H) 01/24/2020 1045   LDLCALC 142 (H) 05/31/2019 1246   LDLDIRECT 123.0 12/03/2019 1136      Wt Readings from Last 3 Encounters:  09/19/20 250 lb (113.4 kg)  09/13/20 242 lb 3.2 oz (109.9 kg)  08/23/20 238 lb (108 kg)        ASSESSMENT AND PLAN:  1.  Peripheral arterial disease: Recent ulceration on the right big toe.  Recent successful revascularization with orbital atherectomy and drug-coated balloon angioplasty to the right SFA and popliteal arteries with excellent results.  He does not have any ulceration at the present time.  Continue Plavix for only another month given that he is already on anticoagulation with Pradaxa.  He has residual tibial disease but given that he has no ulceration at the present time, we will continue to monitor closely.  Follow-up in 3 months.  2. Atrial fibrillation: The patient is on Pradaxa and is scheduled for cardioversion tomorrow.  3. Hyperlipidemia: Continue treatment with high dose atorvastatin with a target LDL of less than 70.  4. Coronary artery disease involving native coronary arteries without angina or medical therapy.  5.  Status post TAVR: Seems to be stable.   Disposition:   FU with me in 3 months.  Signed,  [name], MD  09/19/2020 10:15 AM    [place]

## 2020-09-19 NOTE — H&P (View-Only) (Signed)
Cardiology Office Note   Date:  09/19/2020   ID:  Steven Maldonado, Steven Maldonado 1941/04/03, MRN [id number]  PCP:  Lorrene Reid, [name]  Cardiologist:  Dr. Angelena Form  No chief complaint on file.     History of Present Illness: Steven Maldonado is a 79 y.o. male who presents for  a followup visit regarding  PAD . 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 He is followed by me for peripheral arterial disease.  I performed successful angioplasty and drug-coated balloon angioplasty to the right popliteal artery in 2015.  He was subsequently seen by Dr. 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  He was seen recently by Dr. Gwenlyn Found for a small ulceration on the right great toe after he was seen by podiatry.  He had Doppler studies done which showed an occluded heavily calcified distal SFA/popliteal artery. I proceeded with angiography last month which showed no significant aortoiliac disease.  On the right side, there was severe heavily calcified stenosis in the distal SFA with heavily calcified occluded popliteal artery above the knee with severe stenosis affecting the distal anterior tibial artery, occluded distal posterior tibial artery and occluded distal peroneal artery.  I performed successful orbital atherectomy and drug-coated balloon angioplasty to the right distal SFA and popliteal arteries with excellent results.  The ulceration healed with improvement foot pain.  He is scheduled for cardioversion tomorrow.  Past Medical History:  Diagnosis Date  . Chronic diastolic CHF (congestive heart failure) ([place])   . CKD (chronic kidney disease), stage III ([place])   . Constipation   . Coronary artery disease    a. Cath February 2012 in [place] [facility], occluded RCA with  collaterals  . DM type 2 (diabetes mellitus, type 2) ([place])   . Essential hypertension   . Hyperlipidemia   . Neuropathy    feet  . Pacemaker    a. symptomatic brady after TAVR s/p MDT PPM by Dr. Curt Bears 12/04/17  . Persistent atrial fibrillation ([place])   . PONV (postoperative nausea and vomiting)    after valve surgery  . PVD (peripheral vascular disease) ([place])    a. s/p R popliteal artery stenosis tx with drug-coated balloon 05/2014, followed by Dr. Fletcher Anon.  . 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 Severe aortic stenosis    a. 12/02/17: s/p TAVR  . Skin cancer   . Sleep apnea with use of continuous positive airway pressure (CPAP)    04-11-11 AHI was 32.9 and titrated to 15 cm H20, DME is [facility]  . Subclinical hypothyroidism     Past Surgical History:  Procedure Laterality Date  . ABDOMINAL ANGIOGRAM N/A 06/08/2014   Procedure: ABDOMINAL ANGIOGRAM;  Surgeon: Wellington Hampshire, MD;  Location: [facility] CATH LAB;  Service: Cardiovascular;  Laterality: N/A;  . ABDOMINAL AORTOGRAM N/A 04/09/2018   Procedure: ABDOMINAL AORTOGRAM;  Surgeon: Conrad Igiugig, MD;  Location: [place] CV LAB;  Service: Cardiovascular;  Laterality: N/A;  . ABDOMINAL AORTOGRAM W/LOWER EXTREMITY N/A 08/23/2020   Procedure: ABDOMINAL AORTOGRAM W/LOWER EXTREMITY;  Surgeon: Wellington Hampshire, MD;  Location: [place] CV LAB;  Service: Cardiovascular;  Laterality: N/A;  . AMPUTATION Left 04/17/2018  Procedure: LEFT FOOT 3RD RAY AMPUTATION;  Surgeon: Newt Minion, MD;  Location: [place];  Service: Orthopedics;  Laterality: Left;  . AMPUTATION Left 05/28/2018   Procedure: LEFT AMPUTATION BELOW KNEE;  Surgeon: Wylene Simmer, MD;  Location: [place];  Service: Orthopedics;  Laterality: Left;  . APPENDECTOMY  1965  . 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 CARDIAC CATHETERIZATION  12/2010  . CARDIOVERSION   07/2011  . CARDIOVERSION N/A 04/18/2014   Procedure: CARDIOVERSION;  Surgeon: Dorothy Spark, MD;  Location: [facility];  Service: Cardiovascular;  Laterality: N/A;  . CARDIOVERSION N/A 11/03/2015   Procedure: CARDIOVERSION;  Surgeon: Lelon Perla, MD;  Location: [facility] ENDOSCOPY;  Service: Cardiovascular;  Laterality: N/A;  . CARDIOVERSION N/A 05/08/2017   Procedure: CARDIOVERSION;  Surgeon: Dorothy Spark, MD;  Location: [facility] ENDOSCOPY;  Service: Cardiovascular;  Laterality: N/A;  . CARDIOVERSION N/A 07/28/2017   Procedure: CARDIOVERSION;  Surgeon: Dorothy Spark, MD;  Location: [facility] ENDOSCOPY;  Service: Cardiovascular;  Laterality: N/A;  . CARDIOVERSION N/A 02/08/2020   Procedure: CARDIOVERSION;  Surgeon: Buford Dresser, MD;  Location: [facility] ENDOSCOPY;  Service: Cardiovascular;  Laterality: N/A;  . CARDIOVERSION N/A 03/15/2020   Procedure: CARDIOVERSION;  Surgeon: Acie Fredrickson Wonda Cheng, MD;  Location: [place];  Service: Cardiovascular;  Laterality: N/A;  . LOWER EXTREMITY ANGIOGRAM N/A 06/08/2014   Procedure: LOWER EXTREMITY ANGIOGRAM;  Surgeon: Wellington Hampshire, MD;  Location: [facility] CATH LAB;  Service: Cardiovascular;  Laterality: N/A;  . LOWER EXTREMITY ANGIOGRAPHY Left 04/09/2018   Procedure: Lower Extremity Angiography;  Surgeon: Conrad [place], MD;  Location: [place] CV LAB;  Service: Cardiovascular;  Laterality: Left;  . PACEMAKER IMPLANT N/A 12/04/2017   Procedure: PACEMAKER IMPLANT;  Surgeon: Constance Haw, MD;  Location: [place] CV LAB;  Service: Cardiovascular;  Laterality: N/A;  . PERIPHERAL VASCULAR ATHERECTOMY  08/23/2020   Procedure: PERIPHERAL VASCULAR ATHERECTOMY;  Surgeon: Wellington Hampshire, MD;  Location: [place] CV LAB;  Service: Cardiovascular;;  . PERIPHERAL VASCULAR BALLOON ANGIOPLASTY Left 04/09/2018   Procedure: PERIPHERAL VASCULAR BALLOON ANGIOPLASTY;  Surgeon: Conrad Gol[place]a, MD;  Location: [place] CV LAB;  Service: Cardiovascular;  Laterality:  Left;  SFA  . POPLITEAL ARTERY ANGIOPLASTY Right 06/08/2014   Archie Endo 06/08/2014  . RIGHT/LEFT HEART CATH AND CORONARY ANGIOGRAPHY N/A 10/08/2017   Procedure: RIGHT/LEFT HEART CATH AND CORONARY ANGIOGRAPHY;  Surgeon: Burnell Blanks, MD;  Location: [place] CV LAB;  Service: Cardiovascular;  Laterality: N/A;  . SKIN CANCER EXCISION Bilateral    "have had them cut off back of neck X 2; off left upper arm; right wrist, near right shoulder blade" (06/08/2014)  . TEE WITHOUT CARDIOVERSION N/A 12/02/2017   Procedure: TRANSESOPHAGEAL ECHOCARDIOGRAM (TEE);  Surgeon: Burnell Blanks, MD;  Location: [place];  Service: Open Heart Surgery;  Laterality: N/A;  . TEMPORARY PACEMAKER N/A 12/04/2017   Procedure: TEMPORARY PACEMAKER;  Surgeon: Leonie Man, MD;  Location: [place] CV LAB;  Service: Cardiovascular;  Laterality: N/A;  . TRANSCATHETER AORTIC VALVE REPLACEMENT, TRANSFEMORAL N/A 12/02/2017   Procedure: TRANSCATHETER AORTIC VALVE REPLACEMENT, TRANSFEMORAL;  Surgeon: Burnell Blanks, MD;  Location: [place];  Service: Open Heart Surgery;  Laterality: N/A;  using Edwards Sapien 3 Transcatheter Heart Valve size 70mm     Current Outpatient Medications  Medication Sig Dispense Refill  . amiodarone (PACERONE) 200 MG tablet Take 400 mg by mouth daily.     . BD PEN NEEDLE NANO 2ND GEN 32G X 4 MM  MISC USE 4 (FOUR) TIMES DAILY. 200 each 2  . Blood Glucose Monitoring Suppl (FREESTYLE LITE) DEVI 1 each by Does not apply route 2 (two) times daily. 1 each 0  . Choline Fenofibrate (FENOFIBRIC ACID) 135 MG CPDR Take 1 capsule by mouth daily. (Patient taking differently: Take 135 mg by mouth daily. ) 90 capsule 0  . dabigatran (PRADAXA) 150 MG CAPS capsule Take 1 capsule (150 mg total) by mouth every 12 (twelve) hours. (Patient taking differently: Take 150 mg by mouth 2 (two) times daily. ) 180 capsule 0  . docusate sodium (COLACE) 100 MG capsule Take 300 mg by mouth daily.     . Evolocumab  140 MG/ML SOSY Inject 140 mg into the skin every 14 (fourteen) days. 2.1 mL 2  . ezetimibe (ZETIA) 10 MG tablet TAKE 1 TABLET BY MOUTH EVERY DAY (Patient taking differently: Take 10 mg by mouth daily. ) 90 tablet 1  . FREESTYLE LITE test strip 1 EACH BY OTHER ROUTE 4 (FOUR) TIMES DAILY - BEFORE MEALS AND AT BEDTIME. 450 strip 3  . furosemide (LASIX) 20 MG tablet Take 1 tablet (20 mg total) by mouth every other day.    [name] Kitchen glipiZIDE (GLUCOTROL XL) 10 MG 24 hr tablet TAKE 1 TABLET (10 MG TOTAL) BY MOUTH DAILY WITH BREAKFAST. 90 tablet 0  . insulin degludec (TRESIBA FLEXTOUCH) 200 UNIT/ML FlexTouch Pen Inject 26 Units into the skin at bedtime. (Patient taking differently: Inject 26 Units into the skin at bedtime. ) 15 mL 1  . insulin lispro (HUMALOG KWIKPEN) 100 UNIT/ML KwikPen Inject 4-6 units under the skin up to three times daily if eating sweets or high carb foods. (Patient taking differently: Inject 4-6 Units into the skin in the morning, at noon, in the evening, and at bedtime. daily if eating sweets or high carb foods.) 30 mL 1  . levothyroxine (SYNTHROID) 200 MCG tablet Take 1 tablet by mouth Monday-Saturday, and take 1/2 tablet on Sunday (Patient taking differently: Take 100-200 mcg by mouth daily before breakfast. Take 1 tablet (200 mcg)  by mouth Monday-Saturday, and take 0.5 tablet (100 mcg) tablet on Sunday) 30 tablet 1  . metoprolol tartrate (LOPRESSOR) 50 MG tablet TAKE ONE AND ONE-HALF TABLET BY MOUTH TWICE A DAY (Patient taking differently: Take 75 mg by mouth 2 (two) times daily. ) 270 tablet 0  . mirabegron ER (MYRBETRIQ) 25 MG TB24 tablet Take 1 tablet (25 mg total) by mouth 4 (four) times a week. (Patient taking differently: Take 25 mg by mouth daily. ) 30 tablet 0  . mupirocin ointment (BACTROBAN) 2 % Apply to great toe wound daily, with gauze and tape (Patient taking differently: Apply 1 application topically every other day. Apply to great toe wound daily, with gauze and tape) 30 g 1  .  Semaglutide,0.25 or 0.5MG /DOS, (OZEMPIC, 0.25 OR 0.5 MG/DOSE,) 2 MG/1.5ML SOPN Inject 0.5 mg into the skin once a week. 1 pen 2   No current facility-administered medications for this visit.    Allergies:   Patient has no known allergies.    Social History:  The patient  reports that he quit smoking about 47 years ago. His smoking use included cigarettes. He has a 28.00 pack-year smoking history. He has never used smokeless tobacco. He reports that he does not drink alcohol and does not use drugs.   Family History:  The patient's family history includes Diabetes in his brother, daughter, father, mother, sister, and another family member; Heart Problems in his  daughter; Heart attack in his father and mother; Heart failure in his father; Hypertension in his brother, father, mother, and sister; Stroke in his brother.    ROS:  Please see the history of present illness.   Otherwise, review of systems are positive for none.   All other systems are reviewed and negative.    PHYSICAL EXAM: VS:  BP 126/74 (BP Location: Left Arm, Patient Position: Sitting)   Pulse 67   Ht 5\' 11"  (1.803 m)   Wt 250 lb (113.4 kg)   SpO2 99%   BMI 34.87 kg/m  , BMI Body mass index is 34.87 kg/m. GEN: Well nourished, well developed, in no acute distress  HEENT: normal  Neck: no JVD, carotid bruits, or masses Cardiac: Irregularly irregular; no  rubs, or gallops,no edema . There is a 3/6 mid-late peaking aortic stenosis murmur Respiratory:  clear to auscultation bilaterally, normal work of breathing GI: soft, nontender, nondistended, + BS MS: no deformity or atrophy  Skin: warm and dry, no rash Neuro:  Strength and sensation are intact Psych: euthymic mood, full affect Vascular: Femoral pulses are normal.  There is a faint dorsalis pedis pulse on the right side.  EKG:  EKG is not ordered today.    Recent Labs: 04/20/2020: ALT 42 06/20/2020: TSH 2.94 08/15/2020: BUN 20; Creatinine, Ser 1.40; Hemoglobin 13.0;  Platelets 161; Potassium 4.6; Sodium 140    Lipid Panel    Component Value Date/Time   CHOL 161 01/24/2020 1045   TRIG 113 01/24/2020 1045   HDL 34 (L) 01/24/2020 1045   CHOLHDL 4.7 01/24/2020 1045   CHOLHDL 6.1 (H) 05/31/2019 1246   VLDL 43.8 (H) 08/18/2018 1500   LDLCALC 106 (H) 01/24/2020 1045   LDLCALC 142 (H) 05/31/2019 1246   LDLDIRECT 123.0 12/03/2019 1136      Wt Readings from Last 3 Encounters:  09/19/20 250 lb (113.4 kg)  09/13/20 242 lb 3.2 oz (109.9 kg)  08/23/20 238 lb (108 kg)        ASSESSMENT AND PLAN:  1.  Peripheral arterial disease: Recent ulceration on the right big toe.  Recent successful revascularization with orbital atherectomy and drug-coated balloon angioplasty to the right SFA and popliteal arteries with excellent results.  He does not have any ulceration at the present time.  Continue Plavix for only another month given that he is already on anticoagulation with Pradaxa.  He has residual tibial disease but given that he has no ulceration at the present time, we will continue to monitor closely.  Follow-up in 3 months.  2. Atrial fibrillation: The patient is on Pradaxa and is scheduled for cardioversion tomorrow.  3. Hyperlipidemia: Continue treatment with high dose atorvastatin with a target LDL of less than 70.  4. Coronary artery disease involving native coronary arteries without angina or medical therapy.  5.  Status post TAVR: Seems to be stable.   Disposition:   FU with me in 3 months.  Signed,  [name], MD  09/19/2020 10:15 AM    [place]

## 2020-09-20 ENCOUNTER — Ambulatory Visit (HOSPITAL_COMMUNITY): Payer: Medicare Other | Admitting: Anesthesiology

## 2020-09-20 ENCOUNTER — Ambulatory Visit (HOSPITAL_COMMUNITY)
Admission: RE | Admit: 2020-09-20 | Discharge: 2020-09-20 | Disposition: A | Discharge status: Home / Self Care | Payer: Medicare Other | Attending: Internal Medicine | Admitting: Internal Medicine

## 2020-09-20 ENCOUNTER — Other Ambulatory Visit: Payer: Self-pay

## 2020-09-20 ENCOUNTER — Encounter (HOSPITAL_COMMUNITY): Payer: Self-pay | Admitting: Internal Medicine

## 2020-09-20 ENCOUNTER — Ambulatory Visit (HOSPITAL_COMMUNITY)
Admission: RE | Admit: 2020-09-20 | Discharge: 2020-09-20 | Disposition: A | Discharge status: Home / Self Care | Payer: Medicare Other | Source: Ambulatory Visit | Attending: Nurse Practitioner | Admitting: Nurse Practitioner

## 2020-09-20 ENCOUNTER — Encounter (HOSPITAL_COMMUNITY)
Admission: RE | Disposition: A | Discharge status: Home / Self Care | Payer: Self-pay | Source: Home / Self Care | Attending: Internal Medicine

## 2020-09-20 DIAGNOSIS — I5032 Chronic diastolic (congestive) heart failure: Secondary | ICD-10-CM | POA: Diagnosis not present

## 2020-09-20 DIAGNOSIS — Z89512 Acquired absence of left leg below knee: Secondary | ICD-10-CM | POA: Diagnosis not present

## 2020-09-20 DIAGNOSIS — Z87891 Personal history of nicotine dependence: Secondary | ICD-10-CM | POA: Diagnosis not present

## 2020-09-20 DIAGNOSIS — I4891 Unspecified atrial fibrillation: Secondary | ICD-10-CM | POA: Diagnosis not present

## 2020-09-20 DIAGNOSIS — Z8249 Family history of ischemic heart disease and other diseases of the circulatory system: Secondary | ICD-10-CM | POA: Insufficient documentation

## 2020-09-20 DIAGNOSIS — Z950 Presence of cardiac pacemaker: Secondary | ICD-10-CM | POA: Insufficient documentation

## 2020-09-20 DIAGNOSIS — E785 Hyperlipidemia, unspecified: Secondary | ICD-10-CM | POA: Diagnosis not present

## 2020-09-20 DIAGNOSIS — Z7902 Long term (current) use of antithrombotics/antiplatelets: Secondary | ICD-10-CM | POA: Insufficient documentation

## 2020-09-20 DIAGNOSIS — Z833 Family history of diabetes mellitus: Secondary | ICD-10-CM | POA: Diagnosis not present

## 2020-09-20 DIAGNOSIS — Z79899 Other long term (current) drug therapy: Secondary | ICD-10-CM | POA: Diagnosis not present

## 2020-09-20 DIAGNOSIS — I4892 Unspecified atrial flutter: Secondary | ICD-10-CM | POA: Diagnosis not present

## 2020-09-20 DIAGNOSIS — Z794 Long term (current) use of insulin: Secondary | ICD-10-CM | POA: Insufficient documentation

## 2020-09-20 DIAGNOSIS — N183 Chronic kidney disease, stage 3 unspecified: Secondary | ICD-10-CM | POA: Diagnosis not present

## 2020-09-20 DIAGNOSIS — I480 Paroxysmal atrial fibrillation: Secondary | ICD-10-CM | POA: Diagnosis not present

## 2020-09-20 DIAGNOSIS — Z7901 Long term (current) use of anticoagulants: Secondary | ICD-10-CM | POA: Insufficient documentation

## 2020-09-20 DIAGNOSIS — Z952 Presence of prosthetic heart valve: Secondary | ICD-10-CM | POA: Insufficient documentation

## 2020-09-20 DIAGNOSIS — Z79890 Hormone replacement therapy (postmenopausal): Secondary | ICD-10-CM | POA: Insufficient documentation

## 2020-09-20 DIAGNOSIS — I2510 Atherosclerotic heart disease of native coronary artery without angina pectoris: Secondary | ICD-10-CM | POA: Insufficient documentation

## 2020-09-20 DIAGNOSIS — N1832 Chronic kidney disease, stage 3b: Secondary | ICD-10-CM | POA: Diagnosis not present

## 2020-09-20 DIAGNOSIS — E1122 Type 2 diabetes mellitus with diabetic chronic kidney disease: Secondary | ICD-10-CM | POA: Diagnosis not present

## 2020-09-20 DIAGNOSIS — I130 Hypertensive heart and chronic kidney disease with heart failure and stage 1 through stage 4 chronic kidney disease, or unspecified chronic kidney disease: Secondary | ICD-10-CM | POA: Insufficient documentation

## 2020-09-20 DIAGNOSIS — E1151 Type 2 diabetes mellitus with diabetic peripheral angiopathy without gangrene: Secondary | ICD-10-CM | POA: Insufficient documentation

## 2020-09-20 HISTORY — PX: CARDIOVERSION: SHX1299

## 2020-09-20 LAB — CBC
HCT: 39.5 % (ref 39.0–52.0)
Hemoglobin: 12.5 g/dL — ABNORMAL LOW (ref 13.0–17.0)
MCH: 30.4 pg (ref 26.0–34.0)
MCHC: 31.6 g/dL (ref 30.0–36.0)
MCV: 96.1 fL (ref 80.0–100.0)
Platelets: 163 10*3/uL (ref 150–400)
RBC: 4.11 MIL/uL — ABNORMAL LOW (ref 4.22–5.81)
RDW: 13.1 % (ref 11.5–15.5)
WBC: 4.7 10*3/uL (ref 4.0–10.5)
nRBC: 0 % (ref 0.0–0.2)

## 2020-09-20 LAB — BASIC METABOLIC PANEL
Anion gap: 9 (ref 5–15)
BUN: 21 mg/dL (ref 8–23)
CO2: 26 mmol/L (ref 22–32)
Calcium: 9.7 mg/dL (ref 8.9–10.3)
Chloride: 107 mmol/L (ref 98–111)
Creatinine, Ser: 1.94 mg/dL — ABNORMAL HIGH (ref 0.61–1.24)
GFR, Estimated: 35 mL/min — ABNORMAL LOW (ref >60)
Glucose, Bld: 170 mg/dL — ABNORMAL HIGH (ref 70–99)
Potassium: 4.4 mmol/L (ref 3.5–5.1)
Sodium: 142 mmol/L (ref 135–145)

## 2020-09-20 SURGERY — CARDIOVERSION
Anesthesia: General

## 2020-09-20 MED ORDER — SODIUM CHLORIDE 0.9 % IV SOLN: INTRAVENOUS | Status: DC | PRN

## 2020-09-20 MED ORDER — LIDOCAINE 2% (20 MG/ML) 5 ML SYRINGE
INTRAMUSCULAR | Status: DC | PRN
  Administered 2020-09-20: 40 mg via INTRAVENOUS

## 2020-09-20 MED ORDER — PROPOFOL 10 MG/ML IV BOLUS
INTRAVENOUS | Status: DC | PRN
  Administered 2020-09-20: 50 mg via INTRAVENOUS

## 2020-09-20 NOTE — Interval H&P Note (Signed)
History and Physical Interval Note:  09/20/2020 11:08 AM  Steven Maldonado  has presented today for surgery, with the diagnosis of AFIB.  The various methods of treatment have been discussed with the patient and family. After consideration of risks, benefits and other options for treatment, the patient has consented to  Procedure(s): CARDIOVERSION (N/A) as a surgical intervention.  The patient's history has been reviewed, patient examined, no change in status, stable for surgery.  I have reviewed the patient's chart and labs.  Questions were answered to the patient's satisfaction.     Astha Probasco A Stephan Nelis

## 2020-09-20 NOTE — Discharge Instructions (Signed)
Electrical Cardioversion Electrical cardioversion is the delivery of a jolt of electricity to restore a normal rhythm to the heart. A rhythm that is too fast or is not regular keeps the heart from pumping well. In this procedure, sticky patches or metal paddles are placed on the chest to deliver electricity to the heart from a device. This procedure may be done in an emergency if:  There is low or no blood pressure as a result of the heart rhythm.  Normal rhythm must be restored as fast as possible to protect the brain and heart from further damage.  It may save a life. This may also be a scheduled procedure for irregular or fast heart rhythms that are not immediately life-threatening. Tell a health care provider about:  Any allergies you have.  All medicines you are taking, including vitamins, herbs, eye drops, creams, and over-the-counter medicines.  Any problems you or family members have had with anesthetic medicines.  Any blood disorders you have.  Any surgeries you have had.  Any medical conditions you have.  Whether you are pregnant or may be pregnant. What are the risks? Generally, this is a safe procedure. However, problems may occur, including:  Allergic reactions to medicines.  A blood clot that breaks free and travels to other parts of your body.  The possible return of an abnormal heart rhythm within hours or days after the procedure.  Your heart stopping (cardiac arrest). This is rare. What happens before the procedure? Medicines  Your health care provider may have you start taking: ? Blood-thinning medicines (anticoagulants) so your blood does not clot as easily. ? Medicines to help stabilize your heart rate and rhythm.  Ask your health care provider about: ? Changing or stopping your regular medicines. This is especially important if you are taking diabetes medicines or blood thinners. ? Taking medicines such as aspirin and ibuprofen. These medicines can  thin your blood. Do not take these medicines unless your health care provider tells you to take them. ? Taking over-the-counter medicines, vitamins, herbs, and supplements. General instructions  Follow instructions from your health care provider about eating or drinking restrictions.  Plan to have someone take you home from the hospital or clinic.  If you will be going home right after the procedure, plan to have someone with you for 24 hours.  Ask your health care provider what steps will be taken to help prevent infection. These may include washing your skin with a germ-killing soap. What happens during the procedure?   An IV will be inserted into one of your veins.  Sticky patches (electrodes) or metal paddles may be placed on your chest.  You will be given a medicine to help you relax (sedative).  An electrical shock will be delivered. The procedure may vary among health care providers and hospitals. What can I expect after the procedure?  Your blood pressure, heart rate, breathing rate, and blood oxygen level will be monitored until you leave the hospital or clinic.  Your heart rhythm will be watched to make sure it does not change.  You may have some redness on the skin where the shocks were given. Follow these instructions at home:  Do not drive for 24 hours if you were given a sedative during your procedure.  Take over-the-counter and prescription medicines only as told by your health care provider.  Ask your health care provider how to check your pulse. Check it often.  Rest for 48 hours after the procedure or   as told by your health care provider.  Avoid or limit your caffeine use as told by your health care provider.  Keep all follow-up visits as told by your health care provider. This is important. Contact a health care provider if:  You feel like your heart is beating too quickly or your pulse is not regular.  You have a serious muscle cramp that does not go  away. Get help right away if:  You have discomfort in your chest.  You are dizzy or you feel faint.  You have trouble breathing or you are short of breath.  Your speech is slurred.  You have trouble moving an arm or leg on one side of your body.  Your fingers or toes turn cold or blue. Summary  Electrical cardioversion is the delivery of a jolt of electricity to restore a normal rhythm to the heart.  This procedure may be done right away in an emergency or may be a scheduled procedure if the condition is not an emergency.  Generally, this is a safe procedure.  After the procedure, check your pulse often as told by your health care provider. This information is not intended to replace advice given to you by your health care provider. Make sure you discuss any questions you have with your health care provider. Document Revised: 05/31/2019 Document Reviewed: 05/31/2019 Elsevier Patient Education  2020 [employer]  

## 2020-09-20 NOTE — Anesthesia Preprocedure Evaluation (Addendum)
Anesthesia Evaluation  Patient identified by MRN, date of birth, ID band Patient awake    Reviewed: Allergy & Precautions, NPO status , Patient's Chart, lab work & pertinent test results  History of Anesthesia Complications Negative for: history of anesthetic complications  Airway Mallampati: I  TM Distance: >3 FB Neck ROM: Full    Dental no notable dental hx. (+) Dental Advisory Given, Edentulous Upper, Edentulous Lower   Pulmonary sleep apnea and Continuous Positive Airway Pressure Ventilation , former smoker,    Pulmonary exam normal breath sounds clear to auscultation       Cardiovascular hypertension, Pt. on medications + CAD, + Peripheral Vascular Disease and +CHF (diastolic dysfunction)  Normal cardiovascular exam+ dysrhythmias (pradaxa) Atrial Fibrillation + pacemaker (bradycardia s/p TAVR) + Valvular Problems/Murmurs (s/p TAVR 2019, mild MR) MR  Rhythm:Regular Rate:Normal     Neuro/Psych negative neurological ROS  negative psych ROS   GI/Hepatic negative GI ROS, Neg liver ROS,   Endo/Other  diabetes, Type 2, Insulin DependentHypothyroidism Obesity BMI 35  FS 83  Renal/GU Renal Insufficiency and CRFRenal diseaseCKD 3  negative genitourinary   Musculoskeletal negative musculoskeletal ROS (+)   Abdominal (+) + obese,   Peds  Hematology negative hematology ROS (+)   Anesthesia Other Findings   Reproductive/Obstetrics negative OB ROS                            Anesthesia Physical Anesthesia Plan  ASA: III  Anesthesia Plan: General   Post-op Pain Management:    Induction: Intravenous  PONV Risk Score and Plan: 3 and Propofol infusion, TIVA and Treatment may vary due to age or medical condition  Airway Management Planned: Natural Airway and Mask  Additional Equipment: None  Intra-op Plan:   Post-operative Plan:   Informed Consent: I have reviewed the patients History and  Physical, chart, labs and discussed the procedure including the risks, benefits and alternatives for the proposed anesthesia with the patient or authorized representative who has indicated his/her understanding and acceptance.       Plan Discussed with: CRNA  Anesthesia Plan Comments:        Anesthesia Quick Evaluation

## 2020-09-20 NOTE — Transfer of Care (Signed)
Immediate Anesthesia Transfer of Care Note  Patient: Steven Maldonado  Procedure(s) Performed: CARDIOVERSION (N/A )  Patient Location: Endoscopy Unit  Anesthesia Type:General  Level of Consciousness: sedated and responds to stimulation  Airway & Oxygen Therapy: Patient Spontanous Breathing and Patient connected to nasal cannula oxygen  Post-op Assessment: Report given to RN, Post -op Vital signs reviewed and stable and Patient moving all extremities  Post vital signs: Reviewed and stable  Last Vitals:  Vitals Value Taken Time  BP    Temp    Pulse    Resp    SpO2      Last Pain:  Vitals:   09/20/20 1110  PainSc: 0-No pain         Complications: No complications documented.

## 2020-09-20 NOTE — CV Procedure (Signed)
   Electrical Cardioversion Procedure Note Steven Maldonado [id number] 11/06/1941  Procedure: Electrical Cardioversion Indications:  Atrial Flutter  Time Out: Verified patient identification, verified procedure,medications/allergies/relevent history reviewed, required imaging and test results available.  Performed  Procedure Details  The patient was NPO after midnight. Anesthesia was administered at the beside  by Dr.Finuncane with 50mg  of propofol and 40 mg lidocaine.  Cardioversion was done with synchronized biphasic defibrillation with AP pads with 200 J.  Patent did not convert wit the first attempt.  After second  synchronized biphasic defibrillation with AP pads with 200 J..  The patient converted to atrially paced and ventricular paced rhythm. The patient tolerated the procedure well   IMPRESSION:  Successful cardioversion of atrial fibrillation    Steven Maldonado 09/20/2020, 12:04 PM

## 2020-09-20 NOTE — Anesthesia Postprocedure Evaluation (Signed)
Anesthesia Post Note  Patient: Steven Maldonado  Procedure(s) Performed: CARDIOVERSION (N/A )     Patient location during evaluation: PACU Anesthesia Type: General Level of consciousness: awake and alert, oriented and patient cooperative Pain management: pain level controlled Vital Signs Assessment: post-procedure vital signs reviewed and stable Respiratory status: spontaneous breathing, nonlabored ventilation and respiratory function stable Cardiovascular status: blood pressure returned to baseline and stable Postop Assessment: no apparent nausea or vomiting Anesthetic complications: no   No complications documented.  Last Vitals:  Vitals:   09/20/20 1206 09/20/20 1216  BP: (!) 128/51 (!) 115/44  Pulse: 65 64  Resp: 15 (!) 24  SpO2: 97% 100%    Last Pain:  Vitals:   09/20/20 1216  PainSc: 0-No pain                 Pervis Hocking

## 2020-09-21 ENCOUNTER — Ambulatory Visit: Payer: Medicare Other | Admitting: Endocrinology

## 2020-09-21 ENCOUNTER — Telehealth: Payer: Self-pay | Admitting: Cardiovascular Disease

## 2020-09-21 NOTE — Telephone Encounter (Signed)
I spoke with patient's wife.  She reports patient had cardioversion yesterday. Had some shortness of breath when he returned home yesterday afternoon.  Slept well during the night.  Today his shortness of breath has returned. Wife states it is just like prior to his cardioversion. Thinks he may be back in afib. He is OK when sitting but becomes short of breath with any activity.  Does not weigh daily.  No swelling.  He is not sure how to send transmission.  Will ask device clinic to contact him to assist with transmission

## 2020-09-21 NOTE — Telephone Encounter (Signed)
Left message to call back  

## 2020-09-21 NOTE — Telephone Encounter (Signed)
     Pt c/o Shortness Of Breath: STAT if SOB developed within the last 24 hours or pt is noticeably SOB on the phone  1. Are you currently SOB (can you hear that pt is SOB on the phone)? No  2. How long have you been experiencing SOB? this   3. Are you SOB when sitting or when up moving around? Moving around  4. Are you currently experiencing any other symptoms?   Pt said he was in the hospital yesterday to shock his heath back to rhythm and today he felt he may be back in afib and he is SOB

## 2020-09-21 NOTE — Telephone Encounter (Signed)
Transmission received. I let the pt wife speak with device nurse Leigh, rn.

## 2020-09-21 NOTE — Telephone Encounter (Signed)
Not sure there is anything else to do today if he is back on rhythm on the device check. Steven Maldonado

## 2020-09-21 NOTE — Telephone Encounter (Signed)
Patient's wife notified. He has follow up appointment with Dr Curt Bears next week.

## 2020-09-21 NOTE — Telephone Encounter (Signed)
Reviewed with device clinic. No afib or flutter noted.  Device does not check fluid levels. Will forward to Dr Angelena Form regarding shortness of breath.

## 2020-09-21 NOTE — Telephone Encounter (Signed)
Transmission received and reviewed 09/21/20.  Presenting: AS/VP 74. 1 AT/AF event, burden 3.4%, no EGM available. Patient currently back in rhythm. Routing to [employer].

## 2020-09-22 LAB — POCT I-STAT, CHEM 8
BUN: 26 mg/dL — ABNORMAL HIGH (ref 8–23)
Calcium, Ion: 1.14 mmol/L — ABNORMAL LOW (ref 1.15–1.40)
Chloride: 108 mmol/L (ref 98–111)
Creatinine, Ser: 1.9 mg/dL — ABNORMAL HIGH (ref 0.61–1.24)
Glucose, Bld: 124 mg/dL — ABNORMAL HIGH (ref 70–99)
HCT: 36 % — ABNORMAL LOW (ref 39.0–52.0)
Hemoglobin: 12.2 g/dL — ABNORMAL LOW (ref 13.0–17.0)
Potassium: 4.2 mmol/L (ref 3.5–5.1)
Sodium: 143 mmol/L (ref 135–145)
TCO2: 24 mmol/L (ref 22–32)

## 2020-09-25 ENCOUNTER — Ambulatory Visit (INDEPENDENT_AMBULATORY_CARE_PROVIDER_SITE_OTHER): Payer: Medicare Other

## 2020-09-25 ENCOUNTER — Other Ambulatory Visit: Payer: Self-pay | Admitting: *Deleted

## 2020-09-25 DIAGNOSIS — I442 Atrioventricular block, complete: Secondary | ICD-10-CM

## 2020-09-25 LAB — CUP PACEART REMOTE DEVICE CHECK
Battery Remaining Longevity: 98 mo
Battery Voltage: 2.91 V
Brady Statistic AP VP Percent: 27.3 %
Brady Statistic AP VS Percent: 0.02 %
Brady Statistic AS VP Percent: 72.4 %
Brady Statistic AS VS Percent: 0.28 %
Brady Statistic RA Percent Paced: 27.35 %
Brady Statistic RV Percent Paced: 99.7 %
Date Time Interrogation Session: 20211115002553
Implantable Lead Implant Date: 20190124
Implantable Lead Implant Date: 20190124
Implantable Lead Location: 753859
Implantable Lead Location: 753860
Implantable Lead Model: 5076
Implantable Lead Model: 5076
Implantable Pulse Generator Implant Date: 20190124
Lead Channel Impedance Value: 285 Ohm
Lead Channel Impedance Value: 342 Ohm
Lead Channel Impedance Value: 342 Ohm
Lead Channel Impedance Value: 456 Ohm
Lead Channel Pacing Threshold Amplitude: 0.5 V
Lead Channel Pacing Threshold Amplitude: 0.625 V
Lead Channel Pacing Threshold Pulse Width: 0.4 ms
Lead Channel Pacing Threshold Pulse Width: 0.4 ms
Lead Channel Sensing Intrinsic Amplitude: 1.875 mV
Lead Channel Sensing Intrinsic Amplitude: 1.875 mV
Lead Channel Sensing Intrinsic Amplitude: 6.5 mV
Lead Channel Sensing Intrinsic Amplitude: 6.5 mV
Lead Channel Setting Pacing Amplitude: 2 V
Lead Channel Setting Pacing Amplitude: 2.5 V
Lead Channel Setting Pacing Pulse Width: 0.4 ms
Lead Channel Setting Sensing Sensitivity: 4 mV

## 2020-09-25 NOTE — Patient Outreach (Signed)
[facility] [facility]) Care Management  [place]  09/25/2020   MIRL HILLERY 05-15-1941 [id number]  RN Health Coach telephone call to patient.  [employer] compliance verified. Per patient his fasting blood sugar was 125. Patient A1C is 8.8. Per patient he knows the slight increase is because he got off track with his diet. Patient stated that now he is eating what he is suppose to.  Patient stated the he is feeling shortness of breath and gets sweaty with activity. Patient has an appointment with cardiologist scheduled. He stated he wasn't sure if it was his pacemaker of deconditioned.Patient stated if he is deconditioned he will have The Dr to order him some rehab. Patient has agreed to follow up outreach calls.   Encounter Medications:  Outpatient Encounter Medications as of 09/25/2020  Medication Sig  . amiodarone (PACERONE) 200 MG tablet Take 400 mg by mouth daily.   . BD PEN NEEDLE NANO 2ND GEN 32G X 4 MM MISC USE 4 (FOUR) TIMES DAILY.  [name] Kitchen Blood Glucose Monitoring Suppl (FREESTYLE LITE) DEVI 1 each by Does not apply route 2 (two) times daily.  . Choline Fenofibrate (FENOFIBRIC ACID) 135 MG CPDR Take 1 capsule by mouth daily. (Patient taking differently: Take 135 mg by mouth daily. )  . dabigatran (PRADAXA) 150 MG CAPS capsule Take 1 capsule (150 mg total) by mouth every 12 (twelve) hours. (Patient taking differently: Take 150 mg by mouth 2 (two) times daily. )  . docusate sodium (COLACE) 100 MG capsule Take 300 mg by mouth daily.   . Evolocumab 140 MG/ML SOSY Inject 140 mg into the skin every 14 (fourteen) days.  [name] Kitchen ezetimibe (ZETIA) 10 MG tablet TAKE 1 TABLET BY MOUTH EVERY DAY (Patient taking differently: Take 10 mg by mouth daily. )  . FREESTYLE LITE test strip 1 EACH BY OTHER ROUTE 4 (FOUR) TIMES DAILY - BEFORE MEALS AND AT BEDTIME.  . furosemide (LASIX) 20 MG tablet Take 1 tablet (20 mg total) by mouth every other day.  [name] Kitchen glipiZIDE (GLUCOTROL XL) 10 MG 24 hr tablet TAKE  1 TABLET (10 MG TOTAL) BY MOUTH DAILY WITH BREAKFAST.  [name] Kitchen insulin degludec (TRESIBA FLEXTOUCH) 200 UNIT/ML FlexTouch Pen Inject 26 Units into the skin at bedtime. (Patient taking differently: Inject 26 Units into the skin at bedtime. )  . insulin lispro (HUMALOG KWIKPEN) 100 UNIT/ML KwikPen Inject 4-6 units under the skin up to three times daily if eating sweets or high carb foods. (Patient taking differently: Inject 4-6 Units into the skin in the morning, at noon, in the evening, and at bedtime. daily if eating sweets or high carb foods.)  . levothyroxine (SYNTHROID) 200 MCG tablet Take 1 tablet by mouth Monday-Saturday, and take 1/2 tablet on Sunday (Patient taking differently: Take 100-200 mcg by mouth daily before breakfast. Take 1 tablet (200 mcg)  by mouth Monday-Saturday, and take 0.5 tablet (100 mcg) tablet on Sunday)  . metoprolol tartrate (LOPRESSOR) 50 MG tablet TAKE ONE AND ONE-HALF TABLET BY MOUTH TWICE A DAY (Patient taking differently: Take 75 mg by mouth 2 (two) times daily. )  . mirabegron ER (MYRBETRIQ) 25 MG TB24 tablet Take 1 tablet (25 mg total) by mouth 4 (four) times a week. (Patient taking differently: Take 25 mg by mouth daily. )  . mupirocin ointment (BACTROBAN) 2 % Apply to great toe wound daily, with gauze and tape (Patient taking differently: Apply 1 application topically every other day. Apply to great toe wound daily, with gauze and  tape)  . Semaglutide,0.25 or 0.5MG /DOS, (OZEMPIC, 0.25 OR 0.5 MG/DOSE,) 2 MG/1.5ML SOPN Inject 0.5 mg into the skin once a week.   No facility-administered encounter medications on file as of 09/25/2020.    Functional Status:  In your present state of health, do you have any difficulty performing the following activities: 01/06/2020 10/06/2019  Hearing? N N  Vision? N N  Difficulty concentrating or making decisions? N N  Walking or climbing stairs? N Y  Comment lt amb has a prosthesis -  Dressing or bathing? N N  Doing errands, shopping? N  N  Preparing Food and eating ? Y -  Comment per wife they eat out or she fixes it -  Using the Toilet? N -  In the past six months, have you accidently leaked urine? N -  Do you have problems with loss of bowel control? N -  Managing your Finances? N -  Housekeeping or managing your Housekeeping? Y -  Comment wife handles all housekeeping -  Some recent data might be hidden    Fall/Depression Screening: Fall Risk  09/25/2020 07/12/2020 01/06/2020  Falls in the past year? 1 1 1   Number falls in past yr: 1 1 1   Injury with Fall? 0 0 0  Risk for fall due to : History of fall(s);Impaired balance/gait;Impaired mobility History of fall(s);Impaired balance/gait;Impaired mobility History of fall(s);Impaired balance/gait;Impaired mobility  Follow up Falls evaluation completed;Falls prevention discussed Falls evaluation completed Falls evaluation completed;Falls prevention discussed   PHQ 2/9 Scores 05/25/2020 01/24/2020 01/06/2020 10/06/2019 07/02/2019 12/14/2018 10/07/2018  PHQ - 2 Score 0 0 0 0 0 0 0  PHQ- 9 Score 0 0 - 1 - - -    Assessment:  Goals Addressed            This Visit's Progress   . (THN)Make and Keep All Appointments       Follow Up Date [id number]   - call to cancel if needed - keep a calendar with prescription refill dates - keep a calendar with appointment dates    Why is this important?   Part of staying healthy is seeing the doctor for follow-up care.  If you forget your appointments, there are some things you can do to stay on track.    Notes:     . (THN)Monitor and Manage My Blood Sugar       Follow Up Date [id number]   - check blood sugar at prescribed times - check blood sugar if I feel it is too high or too low - enter blood sugar readings and medication or insulin into daily log - take the blood sugar log to all doctor visits - take the blood sugar meter to all doctor visits    Why is this important?   Checking your blood sugar at home helps to keep it from  getting very high or very low.  Writing the results in a diary or log helps the doctor know how to care for you.  Your blood sugar log should have the time, date and the results.  Also, write down the amount of insulin or other medicine that you take.  Other information, like what you ate, exercise done and how you were feeling, will also be helpful.     Notes:     . [facility] Eye Exam       Follow Up Date [id number]   - keep appointment with eye doctor - schedule appointment with eye doctor    Why is  this important?   Eye check-ups are important when you have diabetes.  Vision loss can be prevented.    Notes:     . (THN)Perform Foot Care       Follow Up Date [id number]   - check feet daily for cuts, sores or redness - keep feet up while sitting - trim toenails straight across - wash and dry feet carefully every day - wear comfortable, cotton socks - wear comfortable, well-fitting shoes    Why is this important?   Good foot care is very important when you have diabetes.  There are many things you can do to keep your feet healthy and catch a problem early.    Notes:     . (THN)Set My Target A1C       Follow Up Date [id number]   - set target A1C    Why is this important?   Your target A1C is decided together by you and your doctor.  It is based on several things like your age and other health issues.    Notes:        Plan:  Patient will follow up with cardiologist this week Patient will adhere to diet Provided additional education on Atrial Fibrillation Patient will monitor blood sugars as per ordered RN will follow up with patient within the month of February. RN sent update assessment to PCP  [facility] [phone]

## 2020-09-26 ENCOUNTER — Ambulatory Visit (INDEPENDENT_AMBULATORY_CARE_PROVIDER_SITE_OTHER): Payer: Medicare Other | Admitting: Physician Assistant

## 2020-09-26 ENCOUNTER — Encounter: Payer: Self-pay | Admitting: Physician Assistant

## 2020-09-26 ENCOUNTER — Other Ambulatory Visit: Payer: Self-pay

## 2020-09-26 VITALS — Ht 71.0 in | Wt 243.0 lb

## 2020-09-26 DIAGNOSIS — I152 Hypertension secondary to endocrine disorders: Secondary | ICD-10-CM | POA: Diagnosis not present

## 2020-09-26 DIAGNOSIS — E1169 Type 2 diabetes mellitus with other specified complication: Secondary | ICD-10-CM | POA: Diagnosis not present

## 2020-09-26 DIAGNOSIS — E1159 Type 2 diabetes mellitus with other circulatory complications: Secondary | ICD-10-CM | POA: Diagnosis not present

## 2020-09-26 DIAGNOSIS — N3281 Overactive bladder: Secondary | ICD-10-CM

## 2020-09-26 DIAGNOSIS — N183 Chronic kidney disease, stage 3 unspecified: Secondary | ICD-10-CM

## 2020-09-26 DIAGNOSIS — E11649 Type 2 diabetes mellitus with hypoglycemia without coma: Secondary | ICD-10-CM | POA: Diagnosis not present

## 2020-09-26 DIAGNOSIS — E782 Mixed hyperlipidemia: Secondary | ICD-10-CM

## 2020-09-26 MED ORDER — MIRABEGRON ER 25 MG PO TB24: 25.0000 mg | ORAL_TABLET | Freq: Every day | ORAL | 2 refills | Status: DC

## 2020-09-26 NOTE — Progress Notes (Signed)
Telehealth office visit note for Steven Reid, [name]- at Primary Care at [facility]   I connected with current patient today by telephone and verified that I am speaking with the correct person   . Location of the patient: Home . Location of the provider: Office - This visit type was conducted due to national recommendations for restrictions regarding the COVID-19 Pandemic (e.g. social distancing) in an effort to limit this patient's exposure and mitigate transmission in our community.    - No physical exam could be performed with this format, beyond that communicated to [place] by the patient/ family members as noted.   - Additionally my office staff/ schedulers were to discuss with the patient that there may be a monetary charge related to this service, depending on their medical insurance.  My understanding is that patient understood and consented to proceed.     _________________________________________________________________________________   History of Present Illness: Patient calls in to follow-up on diabetes mellitus, hypertension, and hyperlipidemia.  Has no acute concerns.  Reports is in the process of changing pharmacies and plans to start using [facility] for chronic medications.  Diabetes: Pt denies increased urination or thirst. Pt reports medication compliance.  Reports some episodes of hypoglycemia.  Can tell when his sugar starts running low and usually has something to eat you right away.  Checking glucose at home (freestyle [place]).  Fasting blood sugars can range from 60-120, average 115.  Has an upcoming appointment with his endocrinologist, Dr. Dwyane Dee.  Has made some dietary changes by reducing sugar.  HTN: Pt denies new onset chest pain, palpitations, dizziness, or headache. States lower extremity edema remains stable. Taking medication as directed without side effects.  Does not check blood pressure at home. States was experiencing shortness of breath after  cardioversion for atrial fibrillation but symptoms are improving.  HLD: Pt taking medication as directed without issues. Denies side effects.  States has reduced fried foods.      No flowsheet data found.  Depression screen [facility] 2/9 09/26/2020 05/25/2020 01/24/2020 01/06/2020 10/06/2019  Decreased Interest 0 0 0 0 0  Down, Depressed, Hopeless 0 0 0 0 0  PHQ - 2 Score 0 0 0 0 0  Altered sleeping 0 0 0 - 0  Tired, decreased energy 0 0 0 - 1  Change in appetite 0 0 0 - 0  Feeling bad or failure about yourself  0 0 0 - 0  Trouble concentrating 0 0 0 - 0  Moving slowly or fidgety/restless 0 0 0 - 0  Suicidal thoughts 0 0 0 - 0  PHQ-9 Score 0 0 0 - 1  Difficult doing work/chores - - - - Not difficult at all  Some recent data might be hidden      Impression and Recommendations:     1. Uncontrolled type 2 diabetes mellitus with hypoglycemia without coma ([place])   2. Hypertension associated with diabetes ([place])   3. Mixed diabetic hyperlipidemia associated with type 2 diabetes mellitus ([place])   4. Overactive bladder   5. Stage 3 chronic kidney disease, unspecified whether stage 3a or 3b CKD ([place])     Uncontrolled type 2 diabetes mellitus with hypoglycemia without coma: -Follow up with Endocrinology as scheduled. -Last A1c above goal at 8.8 -Continue current medication regimen. Of note, verified with patient Tresiba dose and is taking 26 units at bedtime. Reviewed last endocrinology consult and Dr. Dwyane Dee advised to stay on 18 units. Recommend to discuss with  Dr. Dwyane Dee at upcoming appointment. -Continue ambulatory glucose monitoring. -Encourage patient to continue with dietary changes and reduce carbohydrates and glucose. -Will continue to monitor alongside Endocrinology.  Hypertension associated with diabetes: -Unable to obtain BP today. -Continue current medication regimen. -Follow a low sodium diet and stay well hydrated. -Will continue to monitor.  Mixed diabetic hyperlipidemia  associated with type 2 diabetes mellitus: -Last lipid pane HDL low at 34, LDL 106 (above goal) -Continue current medication regimen. -Recommend to continue with dietary changes and reduce saturated and trans fats. -Will continue to monitor.  Stage 3 CKD: -Followed by [facility] -Last Cr 1.90, ion calcium mildly elevated at 1.14 -Advised patient to schedule lab visit in 2 weeks to repeat renal function and electrolytes (calcium). -Continue to avoid NSAIDs.  Overactive bladder: -Stable -Continue current medication regimen. Provided new rx for correct dose.  -Will continue to monitor.  Atrial fibrillation: -DCCV performed 09/20/20 -Follow up with cardiology as scheduled.     - As part of my medical decision making, I reviewed the following data within the [place] History obtained from pt /family, CMA notes reviewed and incorporated if applicable, Labs reviewed, Radiograph/ tests reviewed if applicable and OV notes from prior OV's with me, as well as any other specialists she/he has seen since seeing me last, were all reviewed and used in my medical decision making process today.    - Additionally, when appropriate, discussion had with patient regarding our treatment plan, and their biases/concerns about that plan were used in my medical decision making today.    - The patient agreed with the plan and demonstrated an understanding of the instructions.   No barriers to understanding were identified.     - The patient was advised to call back or seek an in-person evaluation if the symptoms worsen or if the condition fails to improve as anticipated.   Return in about 3 months (around 12/27/2020) for DM, HTN, HLD.    No orders of the defined types were placed in this encounter.   Meds ordered this encounter  Medications  . mirabegron ER (MYRBETRIQ) 25 MG TB24 tablet    Sig: Take 1 tablet (25 mg total) by mouth daily.    Dispense:  90 tablet     Refill:  2    Order Specific Question:   Supervising Provider    Answer:   Beatrice Lecher D [[id number]]    Medications Discontinued During This Encounter  Medication Reason  . mirabegron ER (MYRBETRIQ) 25 MG TB24 tablet Dose change       Time spent on visit including pre-visit chart review and post-visit care was 18 minutes.      The [place] was signed into law in 2016 which includes the topic of electronic health records.  This provides immediate access to information in MyChart.  This includes consultation notes, operative notes, office notes, lab results and pathology reports.  If you have any questions about what you read please let us know at your next visit or call us at the office.  We are right here with you.  Note:  This note was prepared with assistance of Dragon voice recognition software. Occasional wrong-word or sound-a-like substitutions may have occurred due to the inherent limitations of voice recognition software.  __________________________________________________________________________________     Patient Care Team    Relationship Specialty Notifications Start End  Steven Maldonado, [place] PCP - General Physician Assistant  05/25/20   Constance Haw, MD  PCP - Electrophysiology Cardiology Admissions 12/04/17   Burnell Blanks, MD PCP - Cardiology Cardiology  08/24/20   Newt Minion, MD Consulting Physician Orthopedic Surgery  04/09/18   Pleasant, Eppie Gibson, RN [place] Management  Admissions 09/03/18   Charlesetta Garibaldi, [place]  Cardiology  10/06/19   Wylene Simmer, MD Consulting Physician Orthopedic Surgery  10/06/19   Elayne Snare, MD Consulting Physician Endocrinology  10/06/19   Dasher, Rayvon Char, MD  Dermatology  10/06/19   Pllc, [place]    10/06/19      -Vitals obtained; medications/ allergies reconciled;  personal medical, social, Sx etc.histories were updated by CMA, reviewed by  me and are reflected in chart   Patient Active Problem List   Diagnosis Date Noted  . Atrial flutter ([place])   . Critical lower limb ischemia ([place]) 08/23/2020  . Chronic kidney disease due to diabetes mellitus ([place]) 01/25/2020  . Morbid obesity ([place]) 01/24/2020  . Diabetes mellitus due to underlying condition with stage 3b chronic kidney disease, without long-term current use of insulin ([place]) 01/24/2020  . PAD (peripheral artery disease) ([place]) 01/24/2020  . Healthcare maintenance 10/06/2019  . Controlled type 2 diabetes mellitus without complication ([place]) 28/41/3244  . Bradycardia 09/01/2018  . Gangrene of left foot ([place]) 05/28/2018  . 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 Obstructive sleep apnea syndrome 05/04/2018  . Obstructive sleep apnea treated with continuous positive airway pressure (CPAP) 05/04/2018  . Type II diabetes mellitus, uncontrolled ([place]) 04/22/2018  . Atherosclerosis of native arteries of the extremities with gangrene ([place]) 04/09/2018  . Atherosclerosis of native artery of extremity ([place]) 04/09/2018  . Cellulitis of right toe 04/04/2018  . Paroxysmal atrial fibrillation ([place]) 12/05/2017  . Cardiac pacemaker in situ   . Symptomatic bradycardia   . Asystole ([place])   . 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 History of aortic valve replacement 12/02/2017  . Severe aortic stenosis   . Bilateral impacted cerumen 01/22/2016  . Hypertension associated with diabetes ([place]) 11/14/2015  . PVD (peripheral vascular disease) ([place]) 05/24/2014  . Chronic kidney disease 08/30/2013  . Hypothyroidism 08/30/2013  . 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 Diabetes mellitus type 2, uncomplicated ([place])   . Aortic valve stenosis 07/25/2011  . Mixed diabetic hyperlipidemia associated with type 2 diabetes mellitus ([place]) 01/10/2011  . CAD (coronary artery disease) 01/10/2011  . Coronary arteriosclerosis  01/10/2011  . Persistent atrial fibrillation ([place]) 11/11/2010     Current Meds  Medication Sig  . amiodarone (PACERONE) 200 MG tablet Take 400 mg by mouth daily.   . BD PEN NEEDLE NANO 2ND GEN 32G X 4 MM MISC USE 4 (FOUR) TIMES DAILY.  [name] Kitchen Blood Glucose Monitoring Suppl (FREESTYLE LITE) DEVI 1 each by Does not apply route 2 (two) times daily.  . Choline Fenofibrate (FENOFIBRIC ACID) 135 MG CPDR Take 1 capsule by mouth daily. (Patient taking differently: Take 135 mg by mouth daily. )  . dabigatran (PRADAXA) 150 MG CAPS capsule Take 1 capsule (150 mg total) by mouth every 12 (twelve) hours. (Patient taking differently: Take 150 mg by mouth 2 (two) times daily. )  . docusate sodium (COLACE) 100 MG capsule Take 300 mg by mouth daily.   . Evolocumab 140 MG/ML SOSY Inject 140 mg into the skin every 14 (fourteen) days.  [name] Kitchen ezetimibe (ZETIA) 10 MG tablet TAKE 1 TABLET BY MOUTH EVERY  DAY (Patient taking differently: Take 10 mg by mouth daily. )  . FREESTYLE LITE test strip 1 EACH BY OTHER ROUTE 4 (FOUR) TIMES DAILY - BEFORE MEALS AND AT BEDTIME.  . furosemide (LASIX) 20 MG tablet Take 1 tablet (20 mg total) by mouth every other day.  [name] Kitchen glipiZIDE (GLUCOTROL XL) 10 MG 24 hr tablet TAKE 1 TABLET (10 MG TOTAL) BY MOUTH DAILY WITH BREAKFAST.  [name] Kitchen insulin degludec (TRESIBA FLEXTOUCH) 200 UNIT/ML FlexTouch Pen Inject 26 Units into the skin at bedtime. (Patient taking differently: Inject 26 Units into the skin at bedtime. )  . insulin lispro (HUMALOG KWIKPEN) 100 UNIT/ML KwikPen Inject 4-6 units under the skin up to three times daily if eating sweets or high carb foods. (Patient taking differently: Inject 4-6 Units into the skin in the morning, at noon, in the evening, and at bedtime. daily if eating sweets or high carb foods.)  . levothyroxine (SYNTHROID) 200 MCG tablet Take 1 tablet by mouth Monday-Saturday, and take 1/2 tablet on Sunday (Patient taking differently: Take 100-200 mcg by mouth daily before breakfast.  Take 1 tablet (200 mcg)  by mouth Monday-Saturday, and take 0.5 tablet (100 mcg) tablet on Sunday)  . metoprolol tartrate (LOPRESSOR) 50 MG tablet TAKE ONE AND ONE-HALF TABLET BY MOUTH TWICE A DAY (Patient taking differently: Take 75 mg by mouth 2 (two) times daily. )  . mupirocin ointment (BACTROBAN) 2 % Apply to great toe wound daily, with gauze and tape (Patient taking differently: Apply 1 application topically every other day. Apply to great toe wound daily, with gauze and tape)  . Semaglutide,0.25 or 0.5MG /DOS, (OZEMPIC, 0.25 OR 0.5 MG/DOSE,) 2 MG/1.5ML SOPN Inject 0.5 mg into the skin once a week.  . [DISCONTINUED] mirabegron ER (MYRBETRIQ) 25 MG TB24 tablet Take 1 tablet (25 mg total) by mouth 4 (four) times a week. (Patient taking differently: Take 25 mg by mouth daily. Patient states he is supposed to take this 7 days a week)     Allergies:  No Known Allergies   ROS:  See above HPI for pertinent positives and negatives   Objective:   Height 5\' 11"  (1.803 m), weight 243 lb (110.2 kg).  (if some vitals are omitted, this means that patient was UNABLE to obtain them even though they were asked to get them prior to OV today.  They were asked to call us at their earliest convenience with these once obtained. ) General: A & O * 3; sounds in no acute distress Respiratory: speaking in full sentences, no conversational dyspnea Psych: insight appears good, mood- appears full

## 2020-09-26 NOTE — Progress Notes (Signed)
Remote pacemaker transmission.   

## 2020-09-27 NOTE — Patient Instructions (Signed)
Goals Addressed            This Visit's Progress   . (THN)Make and Keep All Appointments       Follow Up Date [id number]   - call to cancel if needed - keep a calendar with prescription refill dates - keep a calendar with appointment dates    Why is this important?   Part of staying healthy is seeing the doctor for follow-up care.  If you forget your appointments, there are some things you can do to stay on track.    Notes:     . (THN)Monitor and Manage My Blood Sugar       Follow Up Date [id number]   - check blood sugar at prescribed times - check blood sugar if I feel it is too high or too low - enter blood sugar readings and medication or insulin into daily log - take the blood sugar log to all doctor visits - take the blood sugar meter to all doctor visits    Why is this important?   Checking your blood sugar at home helps to keep it from getting very high or very low.  Writing the results in a diary or log helps the doctor know how to care for you.  Your blood sugar log should have the time, date and the results.  Also, write down the amount of insulin or other medicine that you take.  Other information, like what you ate, exercise done and how you were feeling, will also be helpful.     Notes:     . [facility] Eye Exam       Follow Up Date [id number]   - keep appointment with eye doctor - schedule appointment with eye doctor    Why is this important?   Eye check-ups are important when you have diabetes.  Vision loss can be prevented.    Notes:     . (THN)Perform Foot Care       Follow Up Date [id number]   - check feet daily for cuts, sores or redness - keep feet up while sitting - trim toenails straight across - wash and dry feet carefully every day - wear comfortable, cotton socks - wear comfortable, well-fitting shoes    Why is this important?   Good foot care is very important when you have diabetes.  There are many things you can do to keep your feet  healthy and catch a problem early.    Notes:     . (THN)Set My Target A1C       Follow Up Date [id number]   - set target A1C    Why is this important?   Your target A1C is decided together by you and your doctor.  It is based on several things like your age and other health issues.    Notes:

## 2020-09-28 ENCOUNTER — Ambulatory Visit (INDEPENDENT_AMBULATORY_CARE_PROVIDER_SITE_OTHER): Payer: Medicare Other | Admitting: Cardiology

## 2020-09-28 ENCOUNTER — Other Ambulatory Visit: Payer: Self-pay

## 2020-09-28 ENCOUNTER — Encounter: Payer: Self-pay | Admitting: Cardiology

## 2020-09-28 VITALS — BP 124/62 | HR 64 | Ht 71.0 in | Wt 238.0 lb

## 2020-09-28 DIAGNOSIS — Z950 Presence of cardiac pacemaker: Secondary | ICD-10-CM | POA: Diagnosis not present

## 2020-09-28 DIAGNOSIS — I6523 Occlusion and stenosis of bilateral carotid arteries: Secondary | ICD-10-CM | POA: Diagnosis not present

## 2020-09-28 DIAGNOSIS — I4819 Other persistent atrial fibrillation: Secondary | ICD-10-CM

## 2020-09-28 LAB — CUP PACEART INCLINIC DEVICE CHECK
Battery Remaining Longevity: 99 mo
Battery Voltage: 2.91 V
Brady Statistic AP VP Percent: 35.5 %
Brady Statistic AP VS Percent: 0.01 %
Brady Statistic AS VP Percent: 64.25 %
Brady Statistic AS VS Percent: 0.23 %
Brady Statistic RA Percent Paced: 35.41 %
Brady Statistic RV Percent Paced: 99.76 %
Date Time Interrogation Session: 20211118162526
Implantable Lead Implant Date: 20190124
Implantable Lead Implant Date: 20190124
Implantable Lead Location: 753859
Implantable Lead Location: 753860
Implantable Lead Model: 5076
Implantable Lead Model: 5076
Implantable Pulse Generator Implant Date: 20190124
Lead Channel Impedance Value: 342 Ohm
Lead Channel Impedance Value: 361 Ohm
Lead Channel Impedance Value: 380 Ohm
Lead Channel Impedance Value: 456 Ohm
Lead Channel Pacing Threshold Amplitude: 0.5 V
Lead Channel Pacing Threshold Amplitude: 0.75 V
Lead Channel Pacing Threshold Pulse Width: 0.4 ms
Lead Channel Pacing Threshold Pulse Width: 0.4 ms
Lead Channel Sensing Intrinsic Amplitude: 2.125 mV
Lead Channel Sensing Intrinsic Amplitude: 2.25 mV
Lead Channel Sensing Intrinsic Amplitude: 6.5 mV
Lead Channel Sensing Intrinsic Amplitude: 6.5 mV
Lead Channel Setting Pacing Amplitude: 2 V
Lead Channel Setting Pacing Amplitude: 2.5 V
Lead Channel Setting Pacing Pulse Width: 0.4 ms
Lead Channel Setting Sensing Sensitivity: 4 mV

## 2020-09-28 NOTE — Progress Notes (Signed)
Electrophysiology Office Note   Date:  09/29/2020   ID:  Steven, Maldonado [birth year], MRN [id number]  PCP:  Lorrene Reid, [name]  Cardiologist:  Angelena Form Primary Electrophysiologist:  Constance Haw, MD    No chief complaint on file.    History of Present Illness: Steven Maldonado is a 79 y.o. male who is being seen today for the evaluation of complete AV block at the request of Darlina Guys. Presenting today for electrophysiology evaluation.   He has a history of paroxysmal atrial fibrillation, diabetes, hypertension, hyperlipidemia, PAD, coronary disease, severe aortic stenosis status post TAVR complicated by complete heart block. He is status post Medtronic dual-chamber pacemaker.  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  Today, denies symptoms of palpitations, chest pain, shortness of breath, orthopnea, PND, lower extremity edema, claudication, dizziness, presyncope, syncope, bleeding, or neurologic sequela. The patient is tolerating medications without difficulties.  He feels well today.  He has had significant issues with peripheral arterial disease and recently had drug-coated balloon angioplasty to the SFA and popliteal arteries on the right with good results.  He had a wound on his toe that is since healed.  He had a cardioversion 09/20/2020 and is fortunately remained in sinus rhythm.  He would prefer ablation over watchful waiting.  Past Medical History:  Diagnosis Date  . Chronic diastolic CHF (congestive heart failure) ([place])   . CKD (chronic kidney disease), stage III ([place])   . Constipation   . Coronary artery disease    a. Cath February 2012 in [place] [facility], occluded RCA with collaterals  . DM type 2 (diabetes mellitus, type 2) ([place])   . Essential hypertension   . Hyperlipidemia   . Neuropathy    feet  . Pacemaker    a. symptomatic  brady after TAVR s/p MDT PPM by Dr. Curt Bears 12/04/17  . Persistent atrial fibrillation ([place])   . PONV (postoperative nausea and vomiting)    after valve surgery  . PVD (peripheral vascular disease) ([place])    a. s/p R popliteal artery stenosis tx with drug-coated balloon 05/2014, followed by Dr. Fletcher Anon.  . 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 Severe aortic stenosis    a. 12/02/17: s/p TAVR  . Skin cancer   . Sleep apnea with use of continuous positive airway pressure (CPAP)    04-11-11 AHI was 32.9 and titrated to 15 cm H20, DME is [facility]  . Subclinical hypothyroidism    Past Surgical History:  Procedure Laterality Date  . ABDOMINAL ANGIOGRAM N/A 06/08/2014   Procedure: ABDOMINAL ANGIOGRAM;  Surgeon: Wellington Hampshire, MD;  [facility] C[facility] LAB;  Service: Cardiovascular;  Laterality: N/A;  . ABDOMINAL AORTOGRAM N/A 04/09/2018   Procedure: ABDOMINAL AORTOGRAM;  Surgeon: Conrad , MD;  Location: [place] CV LAB;  Service: Cardiovascular;  Laterality: N/A;  . ABDOMINAL AORTOGRAM W/LOWER EXTREMITY N/A 08/23/2020   Procedure: ABDOMINAL AORTOGRAM W/LOWER EXTREMITY;  Surgeon: Wellington Hampshire, MD;  Location: [place] CV LAB;  Service: Cardiovascular;  Laterality: N/A;  . AMPUTATION Left 04/17/2018   Procedure: LEFT FOOT 3RD RAY AMPUTATION;  Surgeon: Newt Minion, MD;  Location: [place];  Service: Orthopedics;  Laterality: Left;  . AMPUTATION Left 05/28/2018   Procedure: LEFT AMPUTATION BELOW KNEE;  Surgeon: Doran Durand,  Jenny Reichmann, MD;  Location: [place];  Service: Orthopedics;  Laterality: Left;  . APPENDECTOMY  1965  . 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 CARDIAC CATHETERIZATION  12/2010  . CARDIOVERSION  07/2011  . CARDIOVERSION N/A 04/18/2014   Procedure: CARDIOVERSION;  Surgeon: Dorothy Spark, MD;  Location: [place];  Service: Cardiovascular;  Laterality: N/A;   . CARDIOVERSION N/A 11/03/2015   Procedure: CARDIOVERSION;  Surgeon: Lelon Perla, MD;  Location: [facility] ENDOSCOPY;  Service: Cardiovascular;  Laterality: N/A;  . CARDIOVERSION N/A 05/08/2017   Procedure: CARDIOVERSION;  Surgeon: Dorothy Spark, MD;  Location: [facility] ENDOSCOPY;  Service: Cardiovascular;  Laterality: N/A;  . CARDIOVERSION N/A 07/28/2017   Procedure: CARDIOVERSION;  Surgeon: Dorothy Spark, MD;  Location: [facility] ENDOSCOPY;  Service: Cardiovascular;  Laterality: N/A;  . CARDIOVERSION N/A 02/08/2020   Procedure: CARDIOVERSION;  Surgeon: Buford Dresser, MD;  Location: [facility] ENDOSCOPY;  Service: Cardiovascular;  Laterality: N/A;  . CARDIOVERSION N/A 03/15/2020   Procedure: CARDIOVERSION;  Surgeon: Acie Fredrickson Wonda Cheng, MD;  Location: [facility] ENDOSCOPY;  Service: Cardiovascular;  Laterality: N/A;  . CARDIOVERSION N/A 09/20/2020   Procedure: CARDIOVERSION;  Surgeon: Werner Lean, MD;  Location: [facility] ENDOSCOPY;  Service: Cardiovascular;  Laterality: N/A;  . LOWER EXTREMITY ANGIOGRAM N/A 06/08/2014   Procedure: LOWER EXTREMITY ANGIOGRAM;  Surgeon: Wellington Hampshire, MD;  Location: [facility] CATH LAB;  Service: Cardiovascular;  Laterality: N/A;  . LOWER EXTREMITY ANGIOGRAPHY Left 04/09/2018   Procedure: Lower Extremity Angiography;  Surgeon: Conrad [place], MD;  Location: [place] CV LAB;  Service: Cardiovascular;  Laterality: Left;  . PACEMAKER IMPLANT N/A 12/04/2017   Procedure: PACEMAKER IMPLANT;  Surgeon: Constance Haw, MD;  Location: [place] CV LAB;  Service: Cardiovascular;  Laterality: N/A;  . PERIPHERAL VASCULAR ATHERECTOMY  08/23/2020   Procedure: PERIPHERAL VASCULAR ATHERECTOMY;  Surgeon: Wellington Hampshire, MD;  Location: [place] CV LAB;  Service: Cardiovascular;;  . PERIPHERAL VASCULAR BALLOON ANGIOPLASTY Left 04/09/2018   Procedure: PERIPHERAL VASCULAR BALLOON ANGIOPLASTY;  Surgeon: Conrad [place]wr, MD;  Location: [place] CV LAB;  Service: Cardiovascular;  Laterality:  Left;  SFA  . POPLITEAL ARTERY ANGIOPLASTY Right 06/08/2014   Archie Endo 06/08/2014  . RIGHT/LEFT HEART CATH AND CORONARY ANGIOGRAPHY N/A 10/08/2017   Procedure: RIGHT/LEFT HEART CATH AND CORONARY ANGIOGRAPHY;  Surgeon: Burnell Blanks, MD;  Location: [place] CV LAB;  Service: Cardiovascular;  Laterality: N/A;  . SKIN CANCER EXCISION Bilateral    "have had them cut off back of neck X 2; off left upper arm; right wrist, near right shoulder blade" (06/08/2014)  . TEE WITHOUT CARDIOVERSION N/A 12/02/2017   Procedure: TRANSESOPHAGEAL ECHOCARDIOGRAM (TEE);  Surgeon: Burnell Blanks, MD;  Location: [place];  Service: Open Heart Surgery;  Laterality: N/A;  . TEMPORARY PACEMAKER N/A 12/04/2017   Procedure: TEMPORARY PACEMAKER;  Surgeon: Leonie Man, MD;  Location: [place] CV LAB;  Service: Cardiovascular;  Laterality: N/A;  . TRANSCATHETER AORTIC VALVE REPLACEMENT, TRANSFEMORAL N/A 12/02/2017   Procedure: TRANSCATHETER AORTIC VALVE REPLACEMENT, TRANSFEMORAL;  Surgeon: Burnell Blanks, MD;  Location: [place];  Service: Open Heart Surgery;  Laterality: N/A;  using Edwards Sapien 3 Transcatheter Heart Valve size 53mm     Current Outpatient Medications  Medication Sig Dispense Refill  . amiodarone (PACERONE) 200 MG tablet Take 400 mg by mouth daily.     . BD PEN NEEDLE NANO 2ND GEN 32G X 4 MM MISC USE 4 (FOUR) TIMES DAILY. 200 each 2  . Blood  Glucose Monitoring Suppl (FREESTYLE LITE) DEVI 1 each by Does not apply route 2 (two) times daily. 1 each 0  . Choline Fenofibrate (FENOFIBRIC ACID) 135 MG CPDR Take 1 capsule by mouth daily. 90 capsule 0  . dabigatran (PRADAXA) 150 MG CAPS capsule Take 1 capsule (150 mg total) by mouth every 12 (twelve) hours. 180 capsule 0  . docusate sodium (COLACE) 100 MG capsule Take 300 mg by mouth daily.     . Evolocumab 140 MG/ML SOSY Inject 140 mg into the skin every 14 (fourteen) days. 2.1 mL 2  . ezetimibe (ZETIA) 10 MG tablet TAKE 1 TABLET BY  MOUTH EVERY DAY 90 tablet 1  . FREESTYLE LITE test strip 1 EACH BY OTHER ROUTE 4 (FOUR) TIMES DAILY - BEFORE MEALS AND AT BEDTIME. 450 strip 3  . furosemide (LASIX) 20 MG tablet Take 1 tablet (20 mg total) by mouth every other day.    [name] Kitchen glipiZIDE (GLUCOTROL XL) 10 MG 24 hr tablet TAKE 1 TABLET (10 MG TOTAL) BY MOUTH DAILY WITH BREAKFAST. 90 tablet 0  . insulin degludec (TRESIBA FLEXTOUCH) 200 UNIT/ML FlexTouch Pen Inject 26 Units into the skin at bedtime. 15 mL 1  . insulin lispro (HUMALOG KWIKPEN) 100 UNIT/ML KwikPen Inject 4-6 units under the skin up to three times daily if eating sweets or high carb foods. 30 mL 1  . levothyroxine (SYNTHROID) 200 MCG tablet Take 1 tablet by mouth Monday-Saturday, and take 1/2 tablet on Sunday 30 tablet 1  . metoprolol tartrate (LOPRESSOR) 50 MG tablet TAKE ONE AND ONE-HALF TABLET BY MOUTH TWICE A DAY 270 tablet 0  . mirabegron ER (MYRBETRIQ) 25 MG TB24 tablet Take 1 tablet (25 mg total) by mouth daily. 90 tablet 2  . mupirocin ointment (BACTROBAN) 2 % Apply to great toe wound daily, with gauze and tape 30 g 1  . Semaglutide,0.25 or 0.5MG /DOS, (OZEMPIC, 0.25 OR 0.5 MG/DOSE,) 2 MG/1.5ML SOPN Inject 0.5 mg into the skin once a week. 1 pen 2   No current facility-administered medications for this visit.    Allergies:   Patient has no known allergies.   Social History:  The patient  reports that he quit smoking about 47 years ago. His smoking use included cigarettes. He has a 28.00 pack-year smoking history. He has never used smokeless tobacco. He reports that he does not drink alcohol and does not use drugs.   Family History:  The patient's family history includes Diabetes in his brother, daughter, father, mother, sister, and another family member; Heart Problems in his daughter; Heart attack in his father and mother; Heart failure in his father; Hypertension in his brother, father, mother, and sister; Stroke in his brother.   ROS:  Please see the history of  present illness.   Otherwise, review of systems is positive for none.   All other systems are reviewed and negative.   PHYSICAL EXAM: VS:  BP 124/62   Pulse 64   Ht 5\' 11"  (1.803 m)   Wt 238 lb (108 kg)   SpO2 93%   BMI 33.19 kg/m  , BMI Body mass index is 33.19 kg/m. GEN: Well nourished, well developed, in no acute distress  HEENT: normal  Neck: no JVD, carotid bruits, or masses Cardiac: RRR; no murmurs, rubs, or gallops,no edema  Respiratory:  clear to auscultation bilaterally, normal work of breathing GI: soft, nontender, nondistended, + BS MS: no deformity or atrophy  Skin: warm and dry, device site well healed Neuro:  Strength and  sensation are intact Psych: euthymic mood, full affect  EKG:  EKG is ordered today. Personal review of the ekg ordered shows atrial sensed, ventricular paced  Personal review of the device interrogation today. Results in [place]: 04/20/2020: ALT 42 06/20/2020: TSH 2.94 09/20/2020: BUN 26; Creatinine, Ser 1.90; Hemoglobin 12.2; Platelets 163; Potassium 4.2; Sodium 143    Lipid Panel     Component Value Date/Time   CHOL 161 01/24/2020 1045   TRIG 113 01/24/2020 1045   HDL 34 (L) 01/24/2020 1045   CHOLHDL 4.7 01/24/2020 1045   CHOLHDL 6.1 (H) 05/31/2019 1246   VLDL 43.8 (H) 08/18/2018 1500   LDLCALC 106 (H) 01/24/2020 1045   LDLCALC 142 (H) 05/31/2019 1246   LDLDIRECT 123.0 12/03/2019 1136     Wt Readings from Last 3 Encounters:  09/28/20 238 lb (108 kg)  09/26/20 243 lb (110.2 kg)  09/19/20 250 lb (113.4 kg)      Other studies Reviewed: Additional studies/ records that were reviewed today include: TTE 12/24/17  Review of the above records today demonstrates:  - Left ventricle: Inferobasal hypokinesis The cavity size was   normal. Wall thickness was increased in a pattern of severe LVH.   Systolic function was normal. The estimated ejection fraction was   in the range of 55% to 60%. - Aortic valve: Post TAVR with no  significant peri valvualr   regurgitation gradients have increased since 12/03/17. - Mitral valve: Severely calcified annulus. Moderately thickened,   moderately calcified leaflets . There was mild regurgitation. - Left atrium: The atrium was moderately dilated. - Atrial septum: No defect or patent foramen ovale was identified.   ASSESSMENT AND PLAN:  1. Complete heart block: Status post Medtronic dual-chamber pacemaker implanted 12/04/17. Device functioning appropriately. No changes at this time.   2. Persistent atrial fibrillation: Currently on amiodarone, monitoring for high risk medication, and Pradaxa with a CHA2DS2-VASc of 4.  He has had a recent cardioversion, but has had multiple and would prefer ablation.  Risks and benefits of been discussed and include bleeding, tamponade, heart block, stroke, damage to chest organs.  He understands these risks and has agreed to the procedure.  3. Hypertension: Currently well controlled  4. Coronary artery disease: No current chest pain  5. Hyperlipidemia: Continue statin per primary cardiology  6. Obstructive sleep apnea: CPAP compliance encouraged   Current medicines are reviewed at length with the patient today.   The patient does not have concerns regarding his medicines.  The following changes were made today: None  Labs/ tests ordered today include:  Orders Placed This Encounter  Procedures  . CT CARDIAC MORPH/PULM VEIN W/CM&W/O CA SCORE  . CUP PACEART INCLINIC DEVICE CHECK     Disposition:   FU with Orion Vandervort 3 months  Signed, Nilaya Bouie Meredith Leeds, MD  09/29/2020 8:57 AM     [facility] [address] [place] [address] [phone] (office) [phone] (fax)

## 2020-09-28 NOTE — Patient Instructions (Addendum)
Medication Instructions:  Your physician recommends that you continue on your current medications as directed. Please refer to the Current Medication list given to you today.  *If you need a refill on your cardiac medications before your next appointment, please call your pharmacy*   Lab Work: None ordered If you have labs (blood work) drawn today and your tests are completely normal, you will receive your results only by:  [place] (if you have MyChart) OR  A paper copy in the mail If you have any lab test that is abnormal or we need to change your treatment, we will call you to review the results.   Testing/Procedures: Your physician has requested that you have cardiac CT within 7 days PRIOR to your ablation. Cardiac computed tomography (CT) is a painless test that uses an x-ray machine to take clear, detailed pictures of your heart. . Please follow instructions below located under "other instructions".    Your physician has recommended that you have an ablation. Catheter ablation is a medical procedure used to treat some cardiac arrhythmias (irregular heartbeats). During catheter ablation, a long, thin, flexible tube is put into a blood vessel in your groin (upper thigh), or neck. This tube is called an ablation catheter. It is then guided to your heart through the blood vessel. Radio frequency waves destroy small areas of heart tissue where abnormal heartbeats may cause an arrhythmia to start. Please follow instructions below located under "other instructions".    Follow-Up: At [facility], you and your health needs are our priority.  As part of our continuing mission to provide you with exceptional heart care, we have created designated Provider Care Teams.  These Care Teams include your primary Cardiologist (physician) and Advanced Practice Providers (APPs -  Physician Assistants and Nurse Practitioners) who all work together to provide you with the care you need, when you  need it.  We recommend signing up for the patient portal called "MyChart".  Sign up information is provided on this After Visit Summary.  MyChart is used to connect with patients for Virtual Visits (Telemedicine).  Patients are able to view lab/test results, encounter notes, upcoming appointments, etc.  Non-urgent messages can be sent to your provider as well.   To learn more about what you can do with MyChart, go to [email or web address].    Your next appointment:   4 week(s)  The format for your next appointment:   In Person  Provider:   You will follow up in the [facility] located at [facility]. Your provider will be: Roderic Palau, NP or [place] R. Fenton, [name]    Thank you for choosing [facility]!!   Trinidad Curet, RN [phone]   Other Instructions  CT INSTRUCTIONS  Your cardiac CT will be scheduled at:  [facility] [address] [place], [place] [address] [phone]  Please arrive at the [facility] main entrance of [facility] 30 minutes prior to test start time. Proceed to the [facility] Radiology Department (first floor) to check-in and test prep.  Please follow these instructions carefully (unless otherwise directed):  Hold all erectile dysfunction medications at least 3 days (72 hrs) prior to test.  On the Night Before the Test:  Be sure to Drink plenty of water.  Do not consume any caffeinated/decaffeinated beverages or chocolate 12 hours prior to your test.  Do not take any antihistamines 12 hours prior to your test.  If the patient has contrast allergy:  On the Day of the Test:  Drink plenty of water. Do not drink any water within one hour of the test.  Do not eat any food 4 hours prior to the test.  You may take your regular medications prior to the test.   Take metoprolol (Lopressor) two hours prior to test.  HOLD Furosemide/Hydrochlorothiazide morning of the test.  After the  Test:  Drink plenty of water.  After receiving IV contrast, you may experience a mild flushed feeling. This is normal.  On occasion, you may experience a mild rash up to 24 hours after the test. This is not dangerous. If this occurs, you can take Benadryl 25 mg and increase your fluid intake.  If you experience trouble breathing, this can be serious. If it is severe call 911 IMMEDIATELY. If it is mild, please call our office.  If you take any of these medications: Glipizide/Metformin, Avandament, Glucavance, please do not take 48 hours after completing test unless otherwise instructed.   Once we have confirmed authorization from your insurance company, we will call you to set up a date and time for your test. Based on how quickly your insurance processes prior authorizations requests, please allow up to 4 weeks to be contacted for scheduling your Cardiac CT appointment. Be advised that routine Cardiac CT appointments could be scheduled as many as 8 weeks after your provider has ordered it.  For non-scheduling related questions, please contact the cardiac imaging nurse navigator should you have any questions/concerns: Marchia Bond, Cardiac Imaging Nurse Navigator Burley Saver, Interim Cardiac Imaging Nurse [place] [facility] Services Direct Office Dial: [phone]   For scheduling needs, including cancellations and rescheduling, please call [place], [phone] (temporary number).      Electrophysiology/Ablation Procedure Instructions   You are scheduled for a(n) ablation on 11/08/20 with Dr. Allegra Lai.   1.   Pre procedure testing-             A.  LAB WORK --- On 10/16/20 at [place] TEST-- On 11/08/20 @ 11:30 am  - This is a Drive Up Visit at [address] [place], [place] [address].  Someone will direct you to the appropriate testing line. Stay in your car and someone will be with you shortly.   After you are tested please go  home and self quarantine until the day of your procedure.     2. On the day of your procedure 11/08/20 you will go to [facility] [phone] [address]) at 9:30 AM.  Dennis Bast will go to the main entrance A [employer]) and enter where the [employer] are.  Your driver will drop you off and you will head down the hallway to ADMITTING.  You may have one support person come in to the hospital with you.  They will be asked to wait in the waiting room.   3.   Do not eat or drink after midnight prior to your procedure.   4.   Do not miss any doses of your blood thinner prior to the morning of your procedure or your procedure will need to be rescheduled.       Take 1/2 your usual dose of bedtime insulin the night before procedure.       Do NOT take any medications the morning of your procedure.   5.  Plan for an overnight stay,  but you may be discharged home after your procedure.    If you use your phone frequently bring your phone charger, in case you have to stay.  If you are discharged after your procedure you will need someone to drive you home and be with your for 24 hours after your procedure.   6. You will follow up with the AFIB clinic 4 weeks after your procedure.  You will follow up with Dr. Curt Bears  3 months after your procedure.  These appointments will be made for you.   * If you have ANY questions please call the office [phone] and ask for Rayonna Heldman RN or send me a MyChart message   * Occasionally, EP Studies and ablations can become lengthy.  Please make your family aware of this before your procedure starts.  Average time ranges from 2-8 hours for EP studies/ablations.  Your physician will call your family after the procedure with the results.

## 2020-10-04 NOTE — Addendum Note (Signed)
Addended by: Rose Phi on: 10/04/2020 07:47 AM   Modules accepted: Orders

## 2020-10-10 ENCOUNTER — Ambulatory Visit: Payer: Medicare Other | Admitting: Endocrinology

## 2020-10-12 ENCOUNTER — Telehealth: Payer: Self-pay | Admitting: Physician Assistant

## 2020-10-12 DIAGNOSIS — N183 Chronic kidney disease, stage 3 unspecified: Secondary | ICD-10-CM

## 2020-10-12 NOTE — Telephone Encounter (Signed)
Future labs ordered.  

## 2020-10-16 ENCOUNTER — Other Ambulatory Visit: Payer: Self-pay

## 2020-10-16 ENCOUNTER — Other Ambulatory Visit: Payer: Self-pay | Admitting: Endocrinology

## 2020-10-16 ENCOUNTER — Other Ambulatory Visit: Payer: Medicare Other

## 2020-10-16 DIAGNOSIS — I4891 Unspecified atrial fibrillation: Secondary | ICD-10-CM | POA: Diagnosis not present

## 2020-10-16 DIAGNOSIS — Z01812 Encounter for preprocedural laboratory examination: Secondary | ICD-10-CM | POA: Diagnosis not present

## 2020-10-17 LAB — COMPREHENSIVE METABOLIC PANEL
ALT: 22 IU/L (ref 0–44)
AST: 26 IU/L (ref 0–40)
Albumin/Globulin Ratio: 1.4 (ref 1.2–2.2)
Albumin: 4.2 g/dL (ref 3.7–4.7)
Alkaline Phosphatase: 49 IU/L (ref 44–121)
BUN/Creatinine Ratio: 13 (ref 10–24)
BUN: 21 mg/dL (ref 8–27)
Bilirubin Total: 0.4 mg/dL (ref 0.0–1.2)
CO2: 21 mmol/L (ref 20–29)
Calcium: 10.1 mg/dL (ref 8.6–10.2)
Chloride: 105 mmol/L (ref 96–106)
Creatinine, Ser: 1.65 mg/dL — ABNORMAL HIGH (ref 0.76–1.27)
GFR calc Af Amer: 46 mL/min/{1.73_m2} — ABNORMAL LOW (ref >59)
GFR calc non Af Amer: 39 mL/min/{1.73_m2} — ABNORMAL LOW (ref >59)
Globulin, Total: 2.9 g/dL (ref 1.5–4.5)
Glucose: 117 mg/dL — ABNORMAL HIGH (ref 65–99)
Potassium: 4.6 mmol/L (ref 3.5–5.2)
Sodium: 141 mmol/L (ref 134–144)
Total Protein: 7.1 g/dL (ref 6.0–8.5)

## 2020-10-17 LAB — CBC WITH DIFFERENTIAL/PLATELET
Basophils Absolute: 0.1 10*3/uL (ref 0.0–0.2)
Basos: 2 %
EOS (ABSOLUTE): 0.1 10*3/uL (ref 0.0–0.4)
Eos: 3 %
Hematocrit: 45.1 % (ref 37.5–51.0)
Hemoglobin: 15.1 g/dL (ref 13.0–17.7)
Immature Grans (Abs): 0 10*3/uL (ref 0.0–0.1)
Immature Granulocytes: 0 %
Lymphocytes Absolute: 1.3 10*3/uL (ref 0.7–3.1)
Lymphs: 28 %
MCH: 30.4 pg (ref 26.6–33.0)
MCHC: 33.5 g/dL (ref 31.5–35.7)
MCV: 91 fL (ref 79–97)
Monocytes Absolute: 0.5 10*3/uL (ref 0.1–0.9)
Monocytes: 11 %
Neutrophils Absolute: 2.8 10*3/uL (ref 1.4–7.0)
Neutrophils: 56 %
Platelets: 168 10*3/uL (ref 150–450)
RBC: 4.97 x10E6/uL (ref 4.14–5.80)
RDW: 13.3 % (ref 11.6–15.4)
WBC: 4.8 10*3/uL (ref 3.4–10.8)

## 2020-10-19 DIAGNOSIS — D0439 Carcinoma in situ of skin of other parts of face: Secondary | ICD-10-CM | POA: Diagnosis not present

## 2020-10-20 ENCOUNTER — Telehealth: Payer: Self-pay | Admitting: *Deleted

## 2020-10-20 DIAGNOSIS — R7989 Other specified abnormal findings of blood chemistry: Secondary | ICD-10-CM

## 2020-10-20 DIAGNOSIS — I4819 Other persistent atrial fibrillation: Secondary | ICD-10-CM

## 2020-10-20 DIAGNOSIS — Z01812 Encounter for preprocedural laboratory examination: Secondary | ICD-10-CM

## 2020-10-20 NOTE — Telephone Encounter (Signed)
-----   Message from Lorenza Evangelist, RN sent at 10/18/2020  3:25 PM EST ----- Marykay Lex guys, this patient has very poor kidney function and on the verge of needing a nephrology consult. Please make sure he gets a BMP prior to scan. I will go ahead and make an appt for IV hydration for the day of his CT scan. Thank you, Marchia Bond

## 2020-10-20 NOTE — Telephone Encounter (Signed)
Spoke to wife, pt not home. Advised that I will call back Monday to arrange follow up BMET end of next week. She will let him know.

## 2020-10-23 NOTE — Telephone Encounter (Signed)
Left message to call back  

## 2020-10-24 ENCOUNTER — Encounter: Payer: Self-pay | Admitting: Endocrinology

## 2020-10-24 ENCOUNTER — Other Ambulatory Visit: Payer: Self-pay

## 2020-10-24 ENCOUNTER — Ambulatory Visit (INDEPENDENT_AMBULATORY_CARE_PROVIDER_SITE_OTHER): Payer: Medicare Other | Admitting: Endocrinology

## 2020-10-24 VITALS — BP 112/78 | HR 86 | Ht 71.0 in | Wt 232.0 lb

## 2020-10-24 DIAGNOSIS — E039 Hypothyroidism, unspecified: Secondary | ICD-10-CM

## 2020-10-24 DIAGNOSIS — Z794 Long term (current) use of insulin: Secondary | ICD-10-CM | POA: Diagnosis not present

## 2020-10-24 DIAGNOSIS — E1165 Type 2 diabetes mellitus with hyperglycemia: Secondary | ICD-10-CM

## 2020-10-24 DIAGNOSIS — E782 Mixed hyperlipidemia: Secondary | ICD-10-CM | POA: Diagnosis not present

## 2020-10-24 DIAGNOSIS — I6523 Occlusion and stenosis of bilateral carotid arteries: Secondary | ICD-10-CM | POA: Diagnosis not present

## 2020-10-24 MED ORDER — OZEMPIC (0.25 OR 0.5 MG/DOSE) 2 MG/1.5ML ~~LOC~~ SOPN
0.5000 mg | PEN_INJECTOR | SUBCUTANEOUS | 2 refills | Status: DC
Start: 2020-10-24 — End: 2021-01-22

## 2020-10-24 NOTE — Patient Instructions (Addendum)
No Humalog at bedtime  Check blood sugars on waking up daily  Also check blood sugars about 2 hours after meals and do this after different meals by rotation  Recommended blood sugar levels on waking up are 90-130 and about 2 hours after meal is 130-160  Please bring your blood sugar monitor to each visit, thank you  Reduce Tresiba to 14 if am sugar <70 .  Start OZEMPIC injections by dialing 0.25 mg on the pen as shown once weekly on the same day of the week.   You may inject in the sides of the stomach, outer thigh or arm as indicated in the brochure given. If you have any difficulties using the pen see the video at CompPlans.[email or web address]  You will feel fullness of the stomach with starting the medication and should try to keep the portions at meals small.  You may experience nausea in the first few days which usually gets better over time    After 3 weeks increase the dose to 0.5 mg weekly  If you have any questions or persistent side effects please call the office   You may also talk to a nurse educator with [employer] at [phone] Useful website: [email or web address]

## 2020-10-24 NOTE — Progress Notes (Signed)
Patient ID: Steven Maldonado, male   DOB: Mar 23, 1941, 79 y.o.   MRN: [id number]          Reason for Appointment: Endocrinology follow-up     History of Present Illness:     DIABETES:       Date of diagnosis of type 2 diabetes mellitus:  1999      Background history:   He had been treated with metformin and glipizide for several years Metformin was stopped about 4 years ago because of renal dysfunction Presumably because of poor control he was started on insulin around the year 2009 He had been mostly on Lantus which was subsequently changed to [employer] and he thinks that Lantus worked better Also has been on Humalog 3-4 times a day for some time  Recent history:    INSULIN regimen is:  TRESIBA 16 at bedtime, Humalog 5 units 3 times a day,   Non-insulin hypoglycemic drugs the patient is taking are: Previously on Ozempic 0.5 mg weekly, glipizide ER 10 mg daily  Most recent A1c is 8.8 compared to 7.3  Current management, blood sugar patterns and problems identified:  He was last seen in 06/2020  For some reason he misunderstood the instructions for Ozempic and did not refill it 3 months ago even though he was told not to stop this  Has been taking 16 instead of 18 units of Tresiba since blood sugars are occasionally low normal early morning  With not taking Ozempic his blood sugars are overall much higher with average readings over 200 several times during the day after meals  However he did not call to report a high reading  He is on his own taking Humalog 4 times a day including at bedtime  Frequently will have a significant drop in his blood sugar overnight in the hypoglycemic range when he has sweating episodes  Despite his sugar he does not complain of any excessive thirst or urination, he likes to drink a lot of water anyway  Only occasionally will have regular soft drinks  Usually eating balanced meals with morning meds are back and in the morning for  breakfast  As discussed in interpretation of the CGM below is blood sugars are highly variable at all times and only near target early morning   Side effects from medications have been: None     Meal times are:  Breakfast is at 8 AM, dinnertime 5-6 PM  Interpretation and data from freestyle libre version 2 for the last 2 weeks   Summary of patterns: Blood sugars are highly variable at all times with lowest readings early morning and generally higher readings until midday and highest readings around bedtime  Data is incomplete for the last week when his monitoring has been sporadic  Blood sugars have been below normal occasionally overnight or at breakfast time  Hyperglycemia after meals is present on _0 %  % Time Above 180  29  % Time above 250  23  % Time Below 70  4     PRE-MEAL Fasting Lunch Dinner Bedtime Overall  Glucose range:       Averages:  139  216  193  221  189   POST-MEAL PC Breakfast PC Lunch PC Dinner  Glucose range:     Averages:  153  212  211    Previous data:  CGM use % of time  63  2-week average/SD  142, GV 33  Time in range       72%, was 88  % Time Above 180  25  % Time above 250   % Time Below 70 3     PRE-MEAL Fasting Lunch Dinner Bedtime Overall  Glucose range:       Averages:  105     142   POST-MEAL PC Breakfast PC Lunch PC Dinner  Glucose range:     Averages:  160  168  157     Dietician visit, most recent: 9/19  Weight history:  Wt Readings from Last 3 Encounters:  10/24/20 232 lb (105.2 kg)  09/28/20 238 lb (108 kg)  09/26/20 243 lb (110.2 kg)    Glycemic control:   Lab Results  Component Value Date   HGBA1C 8.8 (H) 08/23/2020   HGBA1C  7.3 (A) 05/25/2020   HGBA1C 7.3 (A) 03/09/2020   Lab Results  Component Value Date   MICROALBUR 10 05/25/2020   LDLCALC 106 (H) 01/24/2020   CREATININE 1.65 (H) 10/16/2020   Lab Results  Component Value Date   MICRALBCREAT <30 05/25/2020    Lab Results  Component Value Date   FRUCTOSAMINE 268 06/26/2018   HYPOTHYROIDISM and other problems: See review of systems   No visits with results within 1 Week(s) from this visit.  Latest known visit with results is:  Lab on 10/16/2020  Component Date Value Ref Range Status  . Glucose 10/16/2020 117* 65 - 99 mg/dL Final  . BUN 10/16/2020 21  8 - 27 mg/dL Final  . Creatinine, Ser 10/16/2020 1.65* 0.76 - 1.27 mg/dL Final  . GFR calc non Af Amer 10/16/2020 39* >59 mL/min/1.73 Final  . GFR calc Af Amer 10/16/2020 46* >59 mL/min/1.73 Final   Comment: **In accordance with recommendations from the NKF-ASN Task force,**   [facility] is in the process of updating its eGFR calculation to the   2021 CKD-EPI creatinine equation that estimates kidney function   without a race variable.   . BUN/Creatinine Ratio 10/16/2020 13  10 - 24 Final  . Sodium 10/16/2020 141  134 - 144 mmol/L Final  . Potassium 10/16/2020 4.6  3.5 - 5.2 mmol/L Final  . Chloride 10/16/2020 105  96 - 106 mmol/L Final  . CO2 10/16/2020 21  20 - 29 mmol/L Final  . Calcium 10/16/2020 10.1  8.6 - 10.2 mg/dL Final  . Total Protein 10/16/2020 7.1  6.0 - 8.5 g/dL Final  . Albumin 10/16/2020 4.2  3.7 - 4.7 g/dL Final  . Globulin, Total 10/16/2020 2.9  1.5 - 4.5 g/dL Final  . Albumin/Globulin Ratio 10/16/2020 1.4  1.2 - 2.2 Final  . Bilirubin Total 10/16/2020 0.4  0.0 - 1.2 mg/dL Final  . Alkaline Phosphatase 10/16/2020 49  44 - 121 IU/L Final                 **Please note reference interval change**  . AST 10/16/2020 26  0 - 40 IU/L Final  . ALT 10/16/2020 22  0 - 44 IU/L Final  . WBC 10/16/2020 4.8  3.4 - 10.8 x10E3/uL Final  . RBC 10/16/2020 4.97  4.14 - 5.80 x10E6/uL Final  .  Hemoglobin 10/16/2020 15.1  13.0 - 17.7 g/dL Final  . Hematocrit 10/16/2020 45.1  37.5 - 51.0 % Final  . MCV 10/16/2020 91  79 - 97 fL Final  . MCH 10/16/2020 30.4  26.6 - 33.0 pg Final  . MCHC 10/16/2020 33.5  31.5 - 35.7 g/dL Final  . RDW 10/16/2020 13.3  11.6 - 15.4 % Final  . Platelets 10/16/2020 168  150 - 450 x10E3/uL Final  . Neutrophils 10/16/2020 56  Not Estab. % Final  . Lymphs 10/16/2020 28  Not Estab. % Final  . Monocytes 10/16/2020 11  Not Estab. % Final  . Eos 10/16/2020 3  Not Estab. % Final  . Basos 10/16/2020 2  Not Estab. % Final  . Neutrophils Absolute 10/16/2020 2.8  1.4 - 7.0 x10E3/uL Final  . Lymphocytes Absolute 10/16/2020 1.3  0.7 - 3.1 x10E3/uL Final  . Monocytes Absolute 10/16/2020 0.5  0.1 - 0.9 x10E3/uL Final  . EOS (ABSOLUTE) 10/16/2020 0.1  0.0 - 0.4 x10E3/uL Final  . Basophils Absolute 10/16/2020 0.1  0.0 - 0.2 x10E3/uL Final  . Immature Granulocytes 10/16/2020 0  Not Estab. % Final  . Immature Grans (Abs) 10/16/2020 0.0  0.0 - 0.1 x10E3/uL Final    Allergies as of 10/24/2020   No Known Allergies     Medication List       Accurate as of October 24, 2020  1:00 PM. If you have any questions, ask your nurse or doctor.        amiodarone 200 MG tablet Commonly known as: PACERONE Take 400 mg by mouth daily.   BD Pen Needle Nano 2nd Gen 32G X 4 MM Misc Generic drug: Insulin Pen Needle USE 4 (FOUR) TIMES DAILY.   dabigatran 150 MG Caps capsule Commonly known as: Pradaxa Take 1 capsule (150 mg total) by mouth every 12 (twelve) hours.   docusate sodium 100 MG capsule Commonly known as: COLACE Take 300 mg by mouth daily.   Evolocumab 140 MG/ML Sosy Inject 140 mg into the skin every 14 (fourteen) days.   ezetimibe 10 MG tablet Commonly known as: ZETIA TAKE 1 TABLET BY MOUTH EVERY DAY   Fenofibric Acid 135 MG Cpdr Take 1 capsule by mouth daily.   FreeStyle Lite Devi 1 each by Does not apply route 2 (two) times daily.   FREESTYLE LITE  test strip Generic drug: glucose blood 1 EACH BY OTHER ROUTE 4 (FOUR) TIMES DAILY - BEFORE MEALS AND AT BEDTIME.   furosemide 20 MG tablet Commonly known as: LASIX Take 1 tablet (20 mg total) by mouth every other day.   glipiZIDE 10 MG 24 hr tablet Commonly known as: GLUCOTROL XL TAKE 1 TABLET (10 MG TOTAL) BY MOUTH DAILY WITH BREAKFAST.   insulin lispro 100 UNIT/ML KwikPen Commonly known as: HumaLOG KwikPen Inject 4-6 units under the skin up to three times daily if eating sweets or high carb foods. What changed:   how much to take  additional instructions   levothyroxine 200 MCG tablet Commonly known as: SYNTHROID TAKE 1 TABLET BY MOUTH DAILY MONDAY-SATURDAY  TAKE 1/2 TABLET ON SUNDAY   metoprolol tartrate 50 MG tablet Commonly known as: LOPRESSOR TAKE ONE AND ONE-HALF TABLET BY MOUTH TWICE A DAY   mirabegron ER 25 MG Tb24 tablet Commonly known as: MYRBETRIQ Take 1 tablet (25 mg total) by mouth daily.   mupirocin ointment 2 % Commonly known as: BACTROBAN Apply to great toe wound daily, with gauze and tape   Ozempic (0.25 or 0.5 MG/DOSE) 2 MG/1.5ML  Sopn Generic drug: Semaglutide(0.25 or 0.5MG/DOS) Inject 0.5 mg into the skin once a week.   [name] FlexTouch 200 UNIT/ML FlexTouch Pen Generic drug: insulin degludec Inject 26 Units into the skin at bedtime.       Allergies: No Known Allergies  Past Medical History:  Diagnosis Date  . Chronic diastolic CHF (congestive heart failure) ([place])   . CKD (chronic kidney disease), stage III ([place])   . Constipation   . Coronary artery disease    a. Cath February 2012 in [place], occluded RCA with collaterals  . DM type 2 (diabetes mellitus, type 2) ([place])   . Essential hypertension   . Hyperlipidemia   . Neuropathy    feet  . Pacemaker    a. symptomatic brady after TAVR s/p MDT PPM by Dr. Curt Bears 12/04/17  . Persistent atrial fibrillation ([place])   . PONV (postoperative nausea and vomiting)    after valve surgery  .  PVD (peripheral vascular disease) ([place])    a. s/p R popliteal artery stenosis tx with drug-coated balloon 05/2014, followed by Dr. Fletcher Anon.  . 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 Severe aortic stenosis    a. 12/02/17: s/p TAVR  . Skin cancer   . Sleep apnea with use of continuous positive airway pressure (CPAP)    04-11-11 AHI was 32.9 and titrated to 15 cm H20, DME is [facility]  . Subclinical hypothyroidism     Past Surgical History:  Procedure Laterality Date  . ABDOMINAL ANGIOGRAM N/A 06/08/2014   Procedure: ABDOMINAL ANGIOGRAM;  Surgeon: Wellington Hampshire, MD;  Location: [facility] CATH LAB;  Service: Cardiovascular;  Laterality: N/A;  . ABDOMINAL AORTOGRAM N/A 04/09/2018   Procedure: ABDOMINAL AORTOGRAM;  Surgeon: Conrad Weston, MD;  Location: [place] CV LAB;  Service: Cardiovascular;  Laterality: N/A;  . ABDOMINAL AORTOGRAM W/LOWER EXTREMITY N/A 08/23/2020   Procedure: ABDOMINAL AORTOGRAM W/LOWER EXTREMITY;  Surgeon: Wellington Hampshire, MD;  Location: [place] CV LAB;  Service: Cardiovascular;  Laterality: N/A;  . AMPUTATION Left 04/17/2018   Procedure: LEFT FOOT 3RD RAY AMPUTATION;  Surgeon: Newt Minion, MD;  Location: [place];  Service: Orthopedics;  Laterality: Left;  . AMPUTATION Left 05/28/2018   Procedure: LEFT AMPUTATION BELOW KNEE;  Surgeon: Wylene Simmer, MD;  Location: [place];  Service: Orthopedics;  Laterality: Left;  . APPENDECTOMY  1965  . 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 CARDIAC CATHETERIZATION  12/2010  . CARDIOVERSION  07/2011  . CARDIOVERSION N/A 04/18/2014   Procedure: CARDIOVERSION;  Surgeon: Dorothy Spark, MD;  Location: [place];  Service: Cardiovascular;  Laterality: N/A;  . CARDIOVERSION N/A 11/03/2015   Procedure: CARDIOVERSION;  Surgeon: Lelon Perla, MD;  Location: [facility] ENDOSCOPY;  Service: Cardiovascular;  Laterality: N/A;  .  CARDIOVERSION N/A 05/08/2017   Procedure: CARDIOVERSION;  Surgeon: Dorothy Spark, MD;  Location: [facility] ENDOSCOPY;  Service: Cardiovascular;  Laterality: N/A;  . CARDIOVERSION N/A 07/28/2017   Procedure: CARDIOVERSION;  Surgeon: Dorothy Spark, MD;  Location: [facility] ENDOSCOPY;  Service: Cardiovascular;  Laterality: N/A;  . CARDIOVERSION N/A 02/08/2020   Procedure: CARDIOVERSION;  Surgeon: Buford Dresser, MD;  Location: [facility] ENDOSCOPY;  Service: Cardiovascular;  Laterality: N/A;  . CARDIOVERSION N/A 03/15/2020   Procedure: CARDIOVERSION;  Surgeon: Acie Fredrickson Wonda Cheng, MD;  Location: [facility] ENDOSCOPY;  Service: Cardiovascular;  Laterality: N/A;  . CARDIOVERSION N/A 09/20/2020   Procedure:  CARDIOVERSION;  Surgeon: Werner Lean, MD;  Location: [place];  Service: Cardiovascular;  Laterality: N/A;  . LOWER EXTREMITY ANGIOGRAM N/A 06/08/2014   Procedure: LOWER EXTREMITY ANGIOGRAM;  Surgeon: Wellington Hampshire, MD;  Location: [place] CATH LAB;  Service: Cardiovascular;  Laterality: N/A;  . LOWER EXTREMITY ANGIOGRAPHY Left 04/09/2018   Procedure: Lower Extremity Angiography;  Surgeon: Conrad Wildomar, MD;  Location: [place] CV LAB;  Service: Cardiovascular;  Laterality: Left;  . PACEMAKER IMPLANT N/A 12/04/2017   Procedure: PACEMAKER IMPLANT;  Surgeon: Constance Haw, MD;  Location: [place] CV LAB;  Service: Cardiovascular;  Laterality: N/A;  . PERIPHERAL VASCULAR ATHERECTOMY  08/23/2020   Procedure: PERIPHERAL VASCULAR ATHERECTOMY;  Surgeon: Wellington Hampshire, MD;  Location: [place] CV LAB;  Service: Cardiovascular;;  . PERIPHERAL VASCULAR BALLOON ANGIOPLASTY Left 04/09/2018   Procedure: PERIPHERAL VASCULAR BALLOON ANGIOPLASTY;  Surgeon: Conrad Bartlett, MD;  Location: [place] CV LAB;  Service: Cardiovascular;  Laterality: Left;  SFA  . POPLITEAL ARTERY ANGIOPLASTY Right 06/08/2014   Archie Endo 06/08/2014  . RIGHT/LEFT HEART CATH AND CORONARY ANGIOGRAPHY N/A 10/08/2017   Procedure:  RIGHT/LEFT HEART CATH AND CORONARY ANGIOGRAPHY;  Surgeon: Burnell Blanks, MD;  Location: [place] CV LAB;  Service: Cardiovascular;  Laterality: N/A;  . SKIN CANCER EXCISION Bilateral    "have had them cut off back of neck X 2; off left upper arm; right wrist, near right shoulder blade" (06/08/2014)  . TEE WITHOUT CARDIOVERSION N/A 12/02/2017   Procedure: TRANSESOPHAGEAL ECHOCARDIOGRAM (TEE);  Surgeon: Burnell Blanks, MD;  Location: [place];  Service: Open Heart Surgery;  Laterality: N/A;  . TEMPORARY PACEMAKER N/A 12/04/2017   Procedure: TEMPORARY PACEMAKER;  Surgeon: Leonie Man, MD;  Location: [place] CV LAB;  Service: Cardiovascular;  Laterality: N/A;  . TRANSCATHETER AORTIC VALVE REPLACEMENT, TRANSFEMORAL N/A 12/02/2017   Procedure: TRANSCATHETER AORTIC VALVE REPLACEMENT, TRANSFEMORAL;  Surgeon: Burnell Blanks, MD;  Location: [place];  Service: Open Heart Surgery;  Laterality: N/A;  using Edwards Sapien 3 Transcatheter Heart Valve size 42m    Family History  Problem Relation Age of Onset  . Diabetes Mother   . Heart attack Mother   . Hypertension Mother   . Heart attack Father   . Heart failure Father   . Hypertension Father   . Diabetes Father   . Diabetes Sister   . Diabetes Brother   . Diabetes Other   . Diabetes Daughter        TYPE ll  . Heart Problems Daughter   . Hypertension Sister   . Hypertension Brother   . Stroke Brother     Social History:  reports that he quit smoking about 47 years ago. His smoking use included cigarettes. He has a 28.00 pack-year smoking history. He has never used smokeless tobacco. He reports that he does not drink alcohol and does not use drugs.   Review of Systems   Lipid history: He is taking 80 mg atorvastatin and his LDL also without adequate control, high-dose with history of CAD  LDL is better with adding Zetia as of 3/21    Lab Results  Component Value Date   CHOL 161 01/24/2020   CHOL 219 (H)  05/31/2019   CHOL 131 08/18/2018   Lab Results  Component Value Date   HDL 34 (L) 01/24/2020   HDL 36 (L) 05/31/2019   HDL 30.30 (L) 08/18/2018   Lab Results  Component Value Date   LDLCALC 106 (H)  01/24/2020   LDLCALC 142 (H) 05/31/2019   LDLCALC 93 10/13/2017   Lab Results  Component Value Date   TRIG 113 01/24/2020   TRIG 264 (H) 05/31/2019   TRIG 219.0 (H) 08/18/2018   Lab Results  Component Value Date   CHOLHDL 4.7 01/24/2020   CHOLHDL 6.1 (H) 05/31/2019   CHOLHDL 4 08/18/2018   Lab Results  Component Value Date   LDLDIRECT 123.0 12/03/2019   LDLDIRECT 77.0 08/18/2018            Hypertension: Has been controlled with only antihypertensive being metoprolol, not on ACE inhibitor  BP Readings from Last 3 Encounters:  10/24/20 112/78  09/28/20 124/62  09/20/20 (!) 106/56    Most recent eye exam was in 12/18 ?  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 History of symptomatic neuropathy  He has chronic kidney disease of unclear etiology, is followed by nephrologist periodically   Lab Results  Component Value Date   CREATININE 1.65 (H) 10/16/2020   CREATININE 1.90 (H) 09/20/2020   CREATININE 1.94 (H) 09/20/2020       Physical Examination:  BP 112/78   Pulse 86   Ht _0  (1.803 m)   Wt 232 lb (105.2 kg)   SpO2 97%   BMI 32.36 kg/m          ASSESSMENT:  Diabetes type 2, insulin requiring with moderate obesity  See history of present illness for detailed discussion of current diabetes management, blood sugar patterns and problems  identified  A1c was last 7.3 and now 8.8 as of October  Blood sugars overall highly variable but quite out of control without Ozempic even though he has lost weight Blood sugar patterns and problems with his management are discussed above  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: Has been on 200 mcg levothyroxine, 6-1/2 days a week and needs follow-up TSH on the next visit  LIPIDS: Needs follow-up labs   PLAN:   He will restart Ozempic but he will need to use 0.25 mg for the first 3 shots and then 0.5 mg weekly If his morning sugars are low he can reduce Tresiba to 14 He will need to stop taking Humalog at bedtime To adjust Humalog as before based on his meal size or if eating dessert can take 8 units May need to reduce or stop Humalog for smaller meals if blood sugars are significantly lower after 3 to 4 weeks of being on Ozempic He will try to be as active as possible    Patient Instructions  No Humalog at bedtime  Check blood sugars on waking up daily  Also check blood sugars about 2 hours after meals and do this after different meals by rotation  Recommended blood sugar levels on waking up are 90-130 and about 2 hours after meal is 130-160  Please bring your blood sugar monitor to each visit, thank you  Reduce Tresiba to 14 if am sugar <70 .  Start OZEMPIC injections by dialing 0.25 mg on the pen as shown once weekly on the same  day of the week.   You may inject in the sides of the stomach, outer thigh or arm as indicated in the brochure given. If you have any difficulties using the pen see the video at [email or web address]  You will feel fullness of the stomach with starting the medication and should try to keep the portions at meals small.  You may experience nausea in the first few days which usually gets better over time    After 3 weeks increase the dose to 0.5 mg weekly  If you have any questions or persistent side effects please call the office   You may also  talk to a nurse educator with [employer] at [phone] Useful website: [email or web address]          Elayne Snare 10/24/2020, 1:00 PM   Note: This office note was prepared with Dragon voice recognition system technology. Any transcriptional errors that result from this process are unintentional.

## 2020-10-25 ENCOUNTER — Telehealth: Payer: Self-pay | Admitting: Endocrinology

## 2020-10-25 NOTE — Telephone Encounter (Signed)
Patient asked if Suanne Marker could please give him a call regarding his Rx - ph # [phone]

## 2020-10-25 NOTE — Telephone Encounter (Signed)
Patient called saying the pharmacist did not understand the directions for the ozempic and asked me to call to explain. Male pharmacist said he understood the directions but it was too soon to fill per insurance.   Pharmacist will call the patient to explain what's going on.

## 2020-10-25 NOTE — Telephone Encounter (Signed)
The patient does know how to take it, he was having problems with the pharmacist understanding your prescription, this has been cleared up

## 2020-10-25 NOTE — Telephone Encounter (Signed)
Patient was given instructions on how to take the dose on his AVS.  According to him he had not filled the prescription for the last 3 months

## 2020-10-27 NOTE — Telephone Encounter (Signed)
Pt advised that he needs repeat BMET today/Monday. Pt reports that he cannot come today. Pt will stop by the office Monday for follow up blood work.

## 2020-10-30 ENCOUNTER — Other Ambulatory Visit: Payer: Self-pay

## 2020-10-30 ENCOUNTER — Other Ambulatory Visit: Payer: Medicare Other | Admitting: *Deleted

## 2020-10-30 DIAGNOSIS — Z01812 Encounter for preprocedural laboratory examination: Secondary | ICD-10-CM

## 2020-10-30 DIAGNOSIS — I4819 Other persistent atrial fibrillation: Secondary | ICD-10-CM

## 2020-10-30 DIAGNOSIS — R7989 Other specified abnormal findings of blood chemistry: Secondary | ICD-10-CM | POA: Diagnosis not present

## 2020-10-30 LAB — BASIC METABOLIC PANEL
BUN/Creatinine Ratio: 19 (ref 10–24)
BUN: 32 mg/dL — ABNORMAL HIGH (ref 8–27)
CO2: 25 mmol/L (ref 20–29)
Calcium: 10.5 mg/dL — ABNORMAL HIGH (ref 8.6–10.2)
Chloride: 103 mmol/L (ref 96–106)
Creatinine, Ser: 1.7 mg/dL — ABNORMAL HIGH (ref 0.76–1.27)
GFR calc Af Amer: 44 mL/min/{1.73_m2} — ABNORMAL LOW (ref >59)
GFR calc non Af Amer: 38 mL/min/{1.73_m2} — ABNORMAL LOW (ref >59)
Glucose: 143 mg/dL — ABNORMAL HIGH (ref 65–99)
Potassium: 4.7 mmol/L (ref 3.5–5.2)
Sodium: 140 mmol/L (ref 134–144)

## 2020-10-31 ENCOUNTER — Telehealth (HOSPITAL_COMMUNITY): Payer: Self-pay | Admitting: *Deleted

## 2020-10-31 NOTE — Telephone Encounter (Signed)
Reaching out to patient to offer assistance regarding upcoming cardiac imaging study; pt's wife verbalizes understanding of appt date/time, parking situation and where to check in, pre-test NPO status and medications ordered, and verified current allergies; name and call back number provided for further questions should they arise  [facility] [phone] office [phone] cell  [name] verbalized understanding that they will arrive at noon at admitting for IV fluids prior to scan.

## 2020-11-01 ENCOUNTER — Ambulatory Visit (HOSPITAL_COMMUNITY)
Admission: RE | Admit: 2020-11-01 | Discharge: 2020-11-01 | Disposition: A | Discharge status: Home / Self Care | Payer: Medicare Other | Source: Ambulatory Visit | Attending: Cardiology | Admitting: Cardiology

## 2020-11-01 ENCOUNTER — Encounter (HOSPITAL_COMMUNITY): Payer: Medicare Other

## 2020-11-02 ENCOUNTER — Ambulatory Visit (HOSPITAL_COMMUNITY)
Admission: RE | Admit: 2020-11-02 | Discharge: 2020-11-02 | Disposition: A | Discharge status: Home / Self Care | Payer: Medicare Other | Source: Ambulatory Visit | Attending: Cardiology | Admitting: Cardiology

## 2020-11-02 ENCOUNTER — Encounter (HOSPITAL_COMMUNITY): Payer: Self-pay

## 2020-11-02 ENCOUNTER — Other Ambulatory Visit: Payer: Self-pay

## 2020-11-02 DIAGNOSIS — I4819 Other persistent atrial fibrillation: Secondary | ICD-10-CM | POA: Diagnosis not present

## 2020-11-02 LAB — BASIC METABOLIC PANEL
Anion gap: 9 (ref 5–15)
BUN: 36 mg/dL — ABNORMAL HIGH (ref 8–23)
CO2: 23 mmol/L (ref 22–32)
Calcium: 9.4 mg/dL (ref 8.9–10.3)
Chloride: 106 mmol/L (ref 98–111)
Creatinine, Ser: 1.93 mg/dL — ABNORMAL HIGH (ref 0.61–1.24)
GFR, Estimated: 35 mL/min — ABNORMAL LOW (ref >60)
Glucose, Bld: 143 mg/dL — ABNORMAL HIGH (ref 70–99)
Potassium: 5.2 mmol/L — ABNORMAL HIGH (ref 3.5–5.1)
Sodium: 138 mmol/L (ref 135–145)

## 2020-11-02 MED ORDER — SODIUM CHLORIDE 0.9% FLUSH: 10.0000 mL | Freq: Two times a day (BID) | INTRAVENOUS | Status: DC

## 2020-11-02 MED ORDER — IOHEXOL 350 MG/ML SOLN
80.0000 mL | Freq: Once | INTRAVENOUS | Status: AC | PRN
  Administered 2020-11-02: 80 mL via INTRAVENOUS

## 2020-11-02 MED ORDER — SODIUM CHLORIDE 0.9 % WEIGHT BASED INFUSION: 1.0000 mL/kg/h | INTRAVENOUS | Status: DC

## 2020-11-02 MED ORDER — SODIUM CHLORIDE 0.9 % WEIGHT BASED INFUSION
3.0000 mL/kg/h | INTRAVENOUS | Status: AC
  Administered 2020-11-02: 3 mL/kg/h via INTRAVENOUS

## 2020-11-02 MED ORDER — SODIUM CHLORIDE 0.9% FLUSH: 10.0000 mL | INTRAVENOUS | Status: DC | PRN

## 2020-11-02 NOTE — Progress Notes (Signed)
Patient fluids was stopped at 1710. Patient was discharged home.

## 2020-11-06 ENCOUNTER — Other Ambulatory Visit (HOSPITAL_COMMUNITY)
Admission: RE | Admit: 2020-11-06 | Discharge: 2020-11-06 | Disposition: A | Discharge status: Home / Self Care | Payer: Medicare Other | Source: Ambulatory Visit | Attending: Cardiology | Admitting: Cardiology

## 2020-11-06 DIAGNOSIS — Z20822 Contact with and (suspected) exposure to covid-19: Secondary | ICD-10-CM | POA: Insufficient documentation

## 2020-11-06 DIAGNOSIS — Z01812 Encounter for preprocedural laboratory examination: Secondary | ICD-10-CM | POA: Diagnosis not present

## 2020-11-06 LAB — SARS CORONAVIRUS 2 (TAT 6-24 HRS): SARS Coronavirus 2: NEGATIVE

## 2020-11-07 NOTE — Progress Notes (Signed)
Instructed patient on the following items: Arrival time 0930Nothing to eat or drink after midnight No meds AM of procedure Responsible person to drive you home and stay with you for 24 hrs  Have you missed any doses of anti-coagulant Pradaxa- hasn't missed any doses

## 2020-11-08 ENCOUNTER — Other Ambulatory Visit: Payer: Self-pay

## 2020-11-08 ENCOUNTER — Ambulatory Visit (HOSPITAL_COMMUNITY)
Admission: RE | Disposition: A | Discharge status: Home / Self Care | Payer: Self-pay | Source: Ambulatory Visit | Attending: Cardiology

## 2020-11-08 ENCOUNTER — Ambulatory Visit (HOSPITAL_COMMUNITY): Payer: Medicare Other | Admitting: Certified Registered"

## 2020-11-08 ENCOUNTER — Encounter (HOSPITAL_COMMUNITY): Payer: Self-pay | Admitting: Cardiology

## 2020-11-08 ENCOUNTER — Ambulatory Visit (HOSPITAL_COMMUNITY)
Admission: RE | Admit: 2020-11-08 | Discharge: 2020-11-08 | Disposition: A | Discharge status: Home / Self Care | Payer: Medicare Other | Source: Ambulatory Visit | Attending: Cardiology | Admitting: Cardiology

## 2020-11-08 DIAGNOSIS — I4819 Other persistent atrial fibrillation: Secondary | ICD-10-CM | POA: Insufficient documentation

## 2020-11-08 DIAGNOSIS — Z950 Presence of cardiac pacemaker: Secondary | ICD-10-CM | POA: Diagnosis not present

## 2020-11-08 DIAGNOSIS — Z8249 Family history of ischemic heart disease and other diseases of the circulatory system: Secondary | ICD-10-CM | POA: Insufficient documentation

## 2020-11-08 DIAGNOSIS — N1832 Chronic kidney disease, stage 3b: Secondary | ICD-10-CM | POA: Diagnosis not present

## 2020-11-08 DIAGNOSIS — I509 Heart failure, unspecified: Secondary | ICD-10-CM | POA: Diagnosis not present

## 2020-11-08 DIAGNOSIS — I442 Atrioventricular block, complete: Secondary | ICD-10-CM | POA: Insufficient documentation

## 2020-11-08 DIAGNOSIS — Z87891 Personal history of nicotine dependence: Secondary | ICD-10-CM | POA: Insufficient documentation

## 2020-11-08 DIAGNOSIS — I480 Paroxysmal atrial fibrillation: Secondary | ICD-10-CM | POA: Diagnosis not present

## 2020-11-08 DIAGNOSIS — E1122 Type 2 diabetes mellitus with diabetic chronic kidney disease: Secondary | ICD-10-CM | POA: Diagnosis not present

## 2020-11-08 DIAGNOSIS — I130 Hypertensive heart and chronic kidney disease with heart failure and stage 1 through stage 4 chronic kidney disease, or unspecified chronic kidney disease: Secondary | ICD-10-CM | POA: Diagnosis not present

## 2020-11-08 HISTORY — PX: ATRIAL FIBRILLATION ABLATION: EP1191

## 2020-11-08 LAB — BASIC METABOLIC PANEL
Anion gap: 10 (ref 5–15)
BUN: 31 mg/dL — ABNORMAL HIGH (ref 8–23)
CO2: 22 mmol/L (ref 22–32)
Calcium: 9.8 mg/dL (ref 8.9–10.3)
Chloride: 106 mmol/L (ref 98–111)
Creatinine, Ser: 1.83 mg/dL — ABNORMAL HIGH (ref 0.61–1.24)
GFR, Estimated: 38 mL/min — ABNORMAL LOW (ref >60)
Glucose, Bld: 122 mg/dL — ABNORMAL HIGH (ref 70–99)
Potassium: 4.1 mmol/L (ref 3.5–5.1)
Sodium: 138 mmol/L (ref 135–145)

## 2020-11-08 LAB — GLUCOSE, CAPILLARY
Glucose-Capillary: 119 mg/dL — ABNORMAL HIGH (ref 70–99)
Glucose-Capillary: 138 mg/dL — ABNORMAL HIGH (ref 70–99)
Glucose-Capillary: 146 mg/dL — ABNORMAL HIGH (ref 70–99)

## 2020-11-08 LAB — POCT ACTIVATED CLOTTING TIME: Activated Clotting Time: 350 seconds

## 2020-11-08 SURGERY — ATRIAL FIBRILLATION ABLATION
Anesthesia: General

## 2020-11-08 MED ORDER — ONDANSETRON HCL 4 MG/2ML IJ SOLN
INTRAMUSCULAR | Status: DC | PRN
  Administered 2020-11-08: 4 mg via INTRAVENOUS

## 2020-11-08 MED ORDER — CEFAZOLIN SODIUM-DEXTROSE 2-4 GM/100ML-% IV SOLN
INTRAVENOUS | Status: AC
  Filled 2020-11-08: qty 100

## 2020-11-08 MED ORDER — SODIUM CHLORIDE 0.9 % IV SOLN: INTRAVENOUS | Status: DC

## 2020-11-08 MED ORDER — HEPARIN (PORCINE) IN NACL 1000-0.9 UT/500ML-% IV SOLN
INTRAVENOUS | Status: DC | PRN
  Administered 2020-11-08 (×5): 500 mL

## 2020-11-08 MED ORDER — PHENYLEPHRINE HCL-NACL 10-0.9 MG/250ML-% IV SOLN
INTRAVENOUS | Status: DC | PRN
  Administered 2020-11-08: 60 ug/min via INTRAVENOUS

## 2020-11-08 MED ORDER — FENTANYL CITRATE (PF) 250 MCG/5ML IJ SOLN
INTRAMUSCULAR | Status: DC | PRN
  Administered 2020-11-08: 100 ug via INTRAVENOUS

## 2020-11-08 MED ORDER — HEPARIN SODIUM (PORCINE) 1000 UNIT/ML IJ SOLN
INTRAMUSCULAR | Status: DC | PRN
  Administered 2020-11-08: 15000 [IU] via INTRAVENOUS
  Administered 2020-11-08: 1000 [IU] via INTRAVENOUS

## 2020-11-08 MED ORDER — FENTANYL CITRATE (PF) 100 MCG/2ML IJ SOLN
INTRAMUSCULAR | Status: AC
  Filled 2020-11-08: qty 2

## 2020-11-08 MED ORDER — LIDOCAINE 2% (20 MG/ML) 5 ML SYRINGE
INTRAMUSCULAR | Status: DC | PRN
  Administered 2020-11-08: 40 mg via INTRAVENOUS

## 2020-11-08 MED ORDER — DOBUTAMINE IN D5W 4-5 MG/ML-% IV SOLN
INTRAVENOUS | Status: AC
  Filled 2020-11-08: qty 250

## 2020-11-08 MED ORDER — ACETAMINOPHEN 325 MG PO TABS
650.0000 mg | ORAL_TABLET | ORAL | Status: DC | PRN
  Filled 2020-11-08: qty 2

## 2020-11-08 MED ORDER — PHENYLEPHRINE 40 MCG/ML (10ML) SYRINGE FOR IV PUSH (FOR BLOOD PRESSURE SUPPORT)
PREFILLED_SYRINGE | INTRAVENOUS | Status: DC | PRN
  Administered 2020-11-08: 80 ug via INTRAVENOUS
  Administered 2020-11-08: 120 ug via INTRAVENOUS

## 2020-11-08 MED ORDER — ONDANSETRON HCL 4 MG/2ML IJ SOLN: 4.0000 mg | Freq: Four times a day (QID) | INTRAMUSCULAR | Status: DC | PRN

## 2020-11-08 MED ORDER — FENTANYL CITRATE (PF) 100 MCG/2ML IJ SOLN
25.0000 ug | Freq: Once | INTRAMUSCULAR | Status: AC
  Administered 2020-11-08: 25 ug via INTRAVENOUS
  Filled 2020-11-08: qty 2

## 2020-11-08 MED ORDER — SUGAMMADEX SODIUM 200 MG/2ML IV SOLN
INTRAVENOUS | Status: DC | PRN
  Administered 2020-11-08: 200 mg via INTRAVENOUS

## 2020-11-08 MED ORDER — CEFAZOLIN SODIUM-DEXTROSE 2-3 GM-%(50ML) IV SOLR
INTRAVENOUS | Status: DC | PRN
  Administered 2020-11-08: 2 g via INTRAVENOUS

## 2020-11-08 MED ORDER — ROCURONIUM BROMIDE 10 MG/ML (PF) SYRINGE
PREFILLED_SYRINGE | INTRAVENOUS | Status: DC | PRN
  Administered 2020-11-08: 60 mg via INTRAVENOUS

## 2020-11-08 MED ORDER — DEXAMETHASONE SODIUM PHOSPHATE 10 MG/ML IJ SOLN
INTRAMUSCULAR | Status: DC | PRN
  Administered 2020-11-08: 10 mg via INTRAVENOUS

## 2020-11-08 MED ORDER — DOBUTAMINE IN D5W 4-5 MG/ML-% IV SOLN
INTRAVENOUS | Status: DC | PRN
  Administered 2020-11-08: 20 ug/kg/min via INTRAVENOUS

## 2020-11-08 MED ORDER — HEPARIN SODIUM (PORCINE) 1000 UNIT/ML IJ SOLN
INTRAMUSCULAR | Status: DC | PRN
  Administered 2020-11-08: 1000 [IU] via INTRAVENOUS

## 2020-11-08 MED ORDER — PROPOFOL 10 MG/ML IV BOLUS
INTRAVENOUS | Status: DC | PRN
  Administered 2020-11-08: 150 mg via INTRAVENOUS

## 2020-11-08 MED ORDER — PROTAMINE SULFATE 10 MG/ML IV SOLN
INTRAVENOUS | Status: DC | PRN
  Administered 2020-11-08: 10 mg via INTRAVENOUS
  Administered 2020-11-08: 30 mg via INTRAVENOUS

## 2020-11-08 MED ORDER — HEPARIN SODIUM (PORCINE) 1000 UNIT/ML IJ SOLN
INTRAMUSCULAR | Status: AC
  Filled 2020-11-08: qty 1

## 2020-11-08 SURGICAL SUPPLY — 22 items
BAG SNAP BAND KOVER 36X36 (MISCELLANEOUS) ×2 IMPLANT
BLANKET WARM UNDERBOD FULL ACC (MISCELLANEOUS) ×2 IMPLANT
CATH 8FR REPROCESSED SOUNDSTAR (CATHETERS) ×2 IMPLANT
CATH MAPPNG PENTARAY F 2-6-2MM (CATHETERS) ×1 IMPLANT
CATH S CIRCA THERM PROBE 10F (CATHETERS) ×2 IMPLANT
CATH SMTCH THERMOCOOL SF DF (CATHETERS) ×2 IMPLANT
CATH WEB BI DIR CSDF CRV REPRO (CATHETERS) ×2 IMPLANT
CLOSURE PERCLOSE PROSTYLE (VASCULAR PRODUCTS) ×8 IMPLANT
COVER SWIFTLINK CONNECTOR (BAG) ×2 IMPLANT
KIT VERSACROSS STEERABLE D1 (CATHETERS) ×2 IMPLANT
MAT PREVALON FULL STRYKER (MISCELLANEOUS) ×2 IMPLANT
PACK EP LATEX FREE (CUSTOM PROCEDURE TRAY) ×2
PACK EP LF (CUSTOM PROCEDURE TRAY) ×1 IMPLANT
PAD PRO RADIOLUCENT 2001M-C (PAD) ×2 IMPLANT
PATCH CARTO3 (PAD) ×2 IMPLANT
PENTARAY F 2-6-2MM (CATHETERS) ×2
SHEATH CARTO VIZIGO SM CVD (SHEATH) ×2 IMPLANT
SHEATH PINNACLE 7F 10CM (SHEATH) ×2 IMPLANT
SHEATH PINNACLE 8F 10CM (SHEATH) ×4 IMPLANT
SHEATH PINNACLE 9F 10CM (SHEATH) ×2 IMPLANT
SHEATH PROBE COVER 6X72 (BAG) ×2 IMPLANT
TUBING SMART ABLATE COOLFLOW (TUBING) ×2 IMPLANT

## 2020-11-08 NOTE — Transfer of Care (Signed)
Immediate Anesthesia Transfer of Care Note  Patient: Steven Maldonado  Procedure(s) Performed: ATRIAL FIBRILLATION ABLATION (N/A )  Patient Location: Cath Lab  Anesthesia Type:General  Level of Consciousness: sedated, patient cooperative and responds to stimulation  Airway & Oxygen Therapy: Patient Spontanous Breathing and Patient connected to nasal cannula oxygen  Post-op Assessment: Report given to RN, Post -op Vital signs reviewed and stable and Patient moving all extremities  Post vital signs: Reviewed and stable  Last Vitals:  Vitals Value Taken Time  BP 154/52 11/08/20 1318  Temp    Pulse 60 11/08/20 1320  Resp 0 11/08/20 1320  SpO2 100 % 11/08/20 1320  Vitals shown include unvalidated device data.  Last Pain:  Vitals:   11/08/20 0932  TempSrc:   PainSc: 0-No pain      Patients Stated Pain Goal: 2 (11/08/20 0932)  Complications: No complications documented.

## 2020-11-08 NOTE — Discharge Instructions (Signed)
Cardiac Ablation, Care After Drink plenty of fluids for 24 hours   This sheet gives you information about how to care for yourself after your procedure. Your health care provider may also give you more specific instructions. If you have problems or questions, contact your health care provider. What can I expect after the procedure? After the procedure, it is common to have:  Bruising around your puncture site.  Tenderness around your puncture site.  Skipped heartbeats.  Tiredness (fatigue). Follow these instructions at home: Puncture site care   Follow instructions from your health care provider about how to take care of your puncture site. Make sure you: ? Wash your hands with soap and water before you change your bandage (dressing). If soap and water are not available, use hand sanitizer. ? Change your dressing as told by your health care provider.  Check your puncture site every day for signs of infection. Check for: ? Redness, swelling, or pain. ? Fluid or blood. If your puncture site starts to bleed, lie down on your back, apply firm pressure to the area, and contact your health care provider. ? Warmth. ? Pus or a bad smell. Driving  Ask your health care provider when it is safe for you to drive again after the procedure.  Do not drive or use heavy machinery while taking prescription pain medicine.  Do not drive for 24 hours if you were given a medicine to help you relax (sedative) during your procedure. Activity  Avoid activities that take a lot of effort for at least 3 days after your procedure.  Do not lift anything that is heavier than 10 lb (4.5 kg), or the limit that you are told, until your health care provider says that it is safe.  Return to your normal activities as told by your health care provider. Ask your health care provider what activities are safe for you. General instructions  Take over-the-counter and prescription medicines only as told by your  health care provider.  Do not use any products that contain nicotine or tobacco, such as cigarettes and e-cigarettes. If you need help quitting, ask your health care provider.  Do not take baths, swim, or use a hot tub until your health care provider approves.  Do not drink alcohol for 24 hours after your procedure.  Keep all follow-up visits as told by your health care provider. This is important. Contact a health care provider if:  You have redness, mild swelling, or pain around your puncture site.  You have fluid or blood coming from your puncture site that stops after applying firm pressure to the area.  Your puncture site feels warm to the touch.  You have pus or a bad smell coming from your puncture site.  You have a fever.  You have chest pain or discomfort that spreads to your neck, jaw, or arm.  You are sweating a lot.  You feel nauseous.  You have a fast or irregular heartbeat.  You have shortness of breath.  You are dizzy or light-headed and feel the need to lie down.  You have pain or numbness in the arm or leg closest to your puncture site. Get help right away if:  Your puncture site suddenly swells.  Your puncture site is bleeding and the bleeding does not stop after applying firm pressure to the area. These symptoms may represent a serious problem that is an emergency. Do not wait to see if the symptoms will go away. Get medical help right  away. Call your local emergency services (911 in the [place] Do not drive yourself to the hospital. Summary  After the procedure, it is normal to have bruising and tenderness at the puncture site in your groin, neck, or forearm.  Check your puncture site every day for signs of infection.  Get help right away if your puncture site is bleeding and the bleeding does not stop after applying firm pressure to the area. This is a medical emergency. This information is not intended to replace advice given to you by your health  care provider. Make sure you discuss any questions you have with your health care provider. Document Revised: 10/10/2017 Document Reviewed: 02/06/2017 Elsevier Patient Education  [place].

## 2020-11-08 NOTE — H&P (Signed)
Electrophysiology Office Note   Date:  11/08/2020   ID:  [name], [name] 01-22-1941, MRN [id number]  PCP:  Mayer Masker, [name]  Cardiologist:  Clifton James Primary Electrophysiologist:  Regan Lemming, MD    No chief complaint on file.    History of Present Illness: Steven Maldonado is a 79 y.o. male who is being seen today for the evaluation of complete AV block at the request of Earney Hamburg. Presenting today for electrophysiology evaluation.   He has a history of paroxysmal atrial fibrillation, diabetes, hypertension, hyperlipidemia, PAD, coronary disease, severe aortic stenosis status post TAVR complicated by complete heart block. He is status post Medtronic dual-chamber pacemaker.  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  Today, denies symptoms of palpitations, chest pain, shortness of breath, orthopnea, PND, lower extremity edema, claudication, dizziness, presyncope, syncope, bleeding, or neurologic sequela. The patient is tolerating medications without difficulties. Plan for ablation today.   Past Medical History:  Diagnosis Date  . Chronic diastolic CHF (congestive heart failure) (HCC)   . CKD (chronic kidney disease), stage III (HCC)   . Constipation   . Coronary artery disease    a. Cath February 2012 in [place], occluded RCA with collaterals  . DM type 2 (diabetes mellitus, type 2) (HCC)   . Essential hypertension   . Hyperlipidemia   . Neuropathy    feet  . Pacemaker    a. symptomatic brady after TAVR s/p MDT PPM by Dr. Elberta Fortis 12/04/17  . Persistent atrial fibrillation (HCC)   . PONV (postoperative nausea and vomiting)    after valve surgery  . PVD (peripheral vascular disease) (HCC)    a. s/p R popliteal artery stenosis tx with drug-coated balloon 05/2014, followed by Dr. Kirke Corin.  . 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 Severe aortic stenosis    a. 12/02/17: s/p TAVR  . Skin cancer   . Sleep apnea with use of continuous positive airway pressure (CPAP)    04-11-11 AHI was 32.9 and titrated to 15 cm H20, DME is [facility]  . Subclinical hypothyroidism    Past Surgical History:  Procedure Laterality Date  . ABDOMINAL ANGIOGRAM N/A 06/08/2014   Procedure: ABDOMINAL ANGIOGRAM;  Surgeon: Iran Ouch, MD;  Location: [facility] CATH LAB;  Service: Cardiovascular;  Laterality: N/A;  . ABDOMINAL AORTOGRAM N/A 04/09/2018   Procedure: ABDOMINAL AORTOGRAM;  Surgeon: Fransisco Hertz, MD;  Location: [facility] INVASIVE CV LAB;  Service: Cardiovascular;  Laterality: N/A;  . ABDOMINAL AORTOGRAM W/LOWER EXTREMITY N/A 08/23/2020   Procedure: ABDOMINAL AORTOGRAM W/LOWER EXTREMITY;  Surgeon: Iran Ouch, MD;  Location: [facility] INVASIVE CV LAB;  Service: Cardiovascular;  Laterality: N/A;  . AMPUTATION Left 04/17/2018   Procedure: LEFT FOOT 3RD RAY AMPUTATION;  Surgeon: Nadara Mustard, MD;  Location[facility] OR;  Service: Orthopedics;  Laterality: Left;  . AMPUTATION Left 05/28/2018   Procedure: LEFT AMPUTATION BELOW KNEE;  Surgeon: Toni Arthurs, MD;  Location: [facility] OR;  Service: Orthopedics;  Laterality: Left;  . APPENDECTOMY  1965  . 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 CARDIAC CATHETERIZATION  12/2010  . CARDIOVERSION  07/2011  . CARDIOVERSION N/A 04/18/2014   Procedure: CARDIOVERSION;  Surgeon: Lars Masson, MD;  Location: [facility] ENDOSCOPY;  Service: Cardiovascular;  Laterality: N/A;  .  CARDIOVERSION N/A 11/03/2015   Procedure: CARDIOVERSION;  Surgeon: Lelon Perla, MD;  Location: [facility] ENDOSCOPY;  Service: Cardiovascular;  Laterality: N/A;  . CARDIOVERSION N/A 05/08/2017   Procedure: CARDIOVERSION;  Surgeon: Dorothy Spark, MD;  Location: [facility] ENDOSCOPY;  Service: Cardiovascular;  Laterality: N/A;  . CARDIOVERSION  N/A 07/28/2017   Procedure: CARDIOVERSION;  Surgeon: Dorothy Spark, MD;  Location: [facility] ENDOSCOPY;  Service: Cardiovascular;  Laterality: N/A;  . CARDIOVERSION N/A 02/08/2020   Procedure: CARDIOVERSION;  Surgeon: Buford Dresser, MD;  Location: [facility] ENDOSCOPY;  Service: Cardiovascular;  Laterality: N/A;  . CARDIOVERSION N/A 03/15/2020   Procedure: CARDIOVERSION;  Surgeon: Thayer Headings, MD;  Location: [facility] ENDOSCOPY;  Service: Cardiovascular;  Laterality: N/A;  . CARDIOVERSION N/A 09/20/2020   Procedure: CARDIOVERSION;  Surgeon: Werner Lean, MD;  Location: [facility] ENDOSCOPY;  Service: Cardiovascular;  Laterality: N/A;  . LOWER EXTREMITY ANGIOGRAM N/A 06/08/2014   Procedure: LOWER EXTREMITY ANGIOGRAM;  Surgeon: Wellington Hampshire, MD;  Location: [facility] CATH LAB;  Service: Cardiovascular;  Laterality: N/A;  . LOWER EXTREMITY ANGIOGRAPHY Left 04/09/2018   Procedure: Lower Extremity Angiography;  Surgeon: Conrad Collinwood, MD;  Location: [place] CV LAB;  Service: Cardiovascular;  Laterality: Left;  . PACEMAKER IMPLANT N/A 12/04/2017   Procedure: PACEMAKER IMPLANT;  Surgeon: Constance Haw, MD;  Location: [place] CV LAB;  Service: Cardiovascular;  Laterality: N/A;  . PERIPHERAL VASCULAR ATHERECTOMY  08/23/2020   Procedure: PERIPHERAL VASCULAR ATHERECTOMY;  Surgeon: Wellington Hampshire, MD;  Location: [place] CV LAB;  Service: Cardiovascular;;  . PERIPHERAL VASCULAR BALLOON ANGIOPLASTY Left 04/09/2018   Procedure: PERIPHERAL VASCULAR BALLOON ANGIOPLASTY;  Surgeon: Conrad Cloverly, MD;  Location: [place] CV LAB;  Service: Cardiovascular;  Laterality: Left;  SFA  . POPLITEAL ARTERY ANGIOPLASTY Right 06/08/2014   Archie Endo 06/08/2014  . RIGHT/LEFT HEART CATH AND CORONARY ANGIOGRAPHY N/A 10/08/2017   Procedure: RIGHT/LEFT HEART CATH AND CORONARY ANGIOGRAPHY;  Surgeon: Burnell Blanks, MD;  Location: [place] CV LAB;  Service: Cardiovascular;  Laterality: N/A;  . SKIN CANCER  EXCISION Bilateral    "have had them cut off back of neck X 2; off left upper arm; right wrist, near right shoulder blade" (06/08/2014)  . TEE WITHOUT CARDIOVERSION N/A 12/02/2017   Procedure: TRANSESOPHAGEAL ECHOCARDIOGRAM (TEE);  Surgeon: Burnell Blanks, MD;  Location: [place];  Service: Open Heart Surgery;  Laterality: N/A;  . TEMPORARY PACEMAKER N/A 12/04/2017   Procedure: TEMPORARY PACEMAKER;  Surgeon: Leonie Man, MD;  Location: [place] CV LAB;  Service: Cardiovascular;  Laterality: N/A;  . TRANSCATHETER AORTIC VALVE REPLACEMENT, TRANSFEMORAL N/A 12/02/2017   Procedure: TRANSCATHETER AORTIC VALVE REPLACEMENT, TRANSFEMORAL;  Surgeon: Burnell Blanks, MD;  Location: [place];  Service: Open Heart Surgery;  Laterality: N/A;  using Edwards Sapien 3 Transcatheter Heart Valve size 15mm     Current Facility-Administered Medications  Medication Dose Route Frequency Provider Last Rate Last Admin  . 0.9 %  sodium chloride infusion   Intravenous Continuous Constance Haw, MD 50 mL/hr at 11/08/20 0940 New Bag at 11/08/20 0940    Allergies:   Patient has no known allergies.   Social History:  The patient  reports that he quit smoking about 48 years ago. His smoking use included cigarettes. He has a 28.00 pack-year smoking history. He has never used smokeless tobacco. He reports that he does not drink alcohol and does not use drugs.   Family History:  The patient's family history includes Diabetes in his  brother, daughter, father, mother, sister, and another family member; Heart Problems in his daughter; Heart attack in his father and mother; Heart failure in his father; Hypertension in his brother, father, mother, and sister; Stroke in his brother.   ROS:  Please see the history of present illness.   Otherwise, review of systems is positive for none.   All other systems are reviewed and negative.   PHYSICAL EXAM: VS:  BP 137/85   Pulse 69   Temp 98.3 F (36.8 C) (Oral)    Ht 5\' 11"  (1.803 m)   Wt 105.2 kg   SpO2 100%   BMI 32.36 kg/m  , BMI Body mass index is 32.36 kg/m. GEN: Well nourished, well developed, in no acute distress  HEENT: normal  Neck: no JVD, carotid bruits, or masses Cardiac: RRR; no murmurs, rubs, or gallops,no edema  Respiratory:  clear to auscultation bilaterally, normal work of breathing GI: soft, nontender, nondistended, + BS MS: no deformity or atrophy  Skin: warm and dry Neuro:  Strength and sensation are intact Psych: euthymic mood, full affect   Recent Labs: 06/20/2020: TSH 2.94 10/16/2020: ALT 22; Hemoglobin 15.1; Platelets 168 11/08/2020: BUN 31; Creatinine, Ser 1.83; Potassium 4.1; Sodium 138    Lipid Panel     Component Value Date/Time   CHOL 161 01/24/2020 1045   TRIG 113 01/24/2020 1045   HDL 34 (L) 01/24/2020 1045   CHOLHDL 4.7 01/24/2020 1045   CHOLHDL 6.1 (H) 05/31/2019 1246   VLDL 43.8 (H) 08/18/2018 1500   LDLCALC 106 (H) 01/24/2020 1045   LDLCALC 142 (H) 05/31/2019 1246   LDLDIRECT 123.0 12/03/2019 1136     Wt Readings from Last 3 Encounters:  11/08/20 105.2 kg  10/24/20 105.2 kg  09/28/20 108 kg      Other studies Reviewed: Additional studies/ records that were reviewed today include: TTE 12/24/17  Review of the above records today demonstrates:  - Left ventricle: Inferobasal hypokinesis The cavity size was   normal. Wall thickness was increased in a pattern of severe LVH.   Systolic function was normal. The estimated ejection fraction was   in the range of 55% to 60%. - Aortic valve: Post TAVR with no significant peri valvualr   regurgitation gradients have increased since 12/03/17. - Mitral valve: Severely calcified annulus. Moderately thickened,   moderately calcified leaflets . There was mild regurgitation. - Left atrium: The atrium was moderately dilated. - Atrial septum: No defect or patent foramen ovale was identified.   ASSESSMENT AND PLAN:  1. Complete heart block: Status post  Medtronic dual-chamber pacemaker implanted 12/04/17. Device functioning appropriately. No changes at this time.   2. Persistent atrial fibrillation: Steven Maldonado has presented today for surgery, with the diagnosis of atrial fibrillation.  The various methods of treatment have been discussed with the patient and family. After consideration of risks, benefits and other options for treatment, the patient has consented to  Procedure(s): Catheter ablation as a surgical intervention .  Risks include but not limited to complete heart block, stroke, esophageal damage, nerve damage, bleeding, vascular damage, tamponade, perforation, MI, and death. The patient's history has been reviewed, patient examined, no change in status, stable for surgery.  I have reviewed the patient's chart and labs.  Questions were answered to the patient's satisfaction.    Azarah Dacy Curt Bears, MD 11/08/2020 10:19 AM

## 2020-11-08 NOTE — Progress Notes (Signed)
Redness noted to both groin area no drainage noted

## 2020-11-08 NOTE — Anesthesia Preprocedure Evaluation (Addendum)
Anesthesia Evaluation  Patient identified by MRN, date of birth, ID band Patient awake    Reviewed: Allergy & Precautions, NPO status , Patient's Chart, lab work & pertinent test results  Airway Mallampati: II  TM Distance: >3 FB     Dental  (+) Dental Advisory Given   Pulmonary sleep apnea , former smoker,    breath sounds clear to auscultation       Cardiovascular hypertension, Pt. on medications and Pt. on home beta blockers + CAD, + Peripheral Vascular Disease and +CHF  + pacemaker  Rhythm:Regular Rate:Normal     Neuro/Psych negative neurological ROS     GI/Hepatic negative GI ROS, Neg liver ROS,   Endo/Other  diabetesHypothyroidism   Renal/GU Renal InsufficiencyRenal disease     Musculoskeletal   Abdominal   Peds  Hematology negative hematology ROS (+)   Anesthesia Other Findings   Reproductive/Obstetrics                             Anesthesia Physical Anesthesia Plan  ASA: III  Anesthesia Plan: General   Post-op Pain Management:    Induction: Intravenous  PONV Risk Score and Plan: 2 and Dexamethasone, Ondansetron and Treatment may vary due to age or medical condition  Airway Management Planned: Oral ETT  Additional Equipment:   Intra-op Plan:   Post-operative Plan: Extubation in OR  Informed Consent: I have reviewed the patients History and Physical, chart, labs and discussed the procedure including the risks, benefits and alternatives for the proposed anesthesia with the patient or authorized representative who has indicated his/her understanding and acceptance.     Dental advisory given  Plan Discussed with: CRNA  Anesthesia Plan Comments:         Anesthesia Quick Evaluation

## 2020-11-08 NOTE — Anesthesia Procedure Notes (Signed)
Procedure Name: Intubation Date/Time: 11/08/2020 11:09 AM Performed by: Myna Bright, CRNA Pre-anesthesia Checklist: Patient identified, Emergency Drugs available, Suction available and Patient being monitored Patient Re-evaluated:Patient Re-evaluated prior to induction Oxygen Delivery Method: Circle system utilized Preoxygenation: Pre-oxygenation with 100% oxygen Induction Type: IV induction Ventilation: Mask ventilation without difficulty and Oral airway inserted - appropriate to patient size Laryngoscope Size: Mac and 4 Grade View: Grade I Tube type: Oral Tube size: 7.5 mm Number of attempts: 1 Airway Equipment and Method: Stylet Placement Confirmation: ETT inserted through vocal cords under direct vision,  positive ETCO2 and breath sounds checked- equal and bilateral Secured at: 22 cm Tube secured with: Tape Dental Injury: Teeth and Oropharynx as per pre-operative assessment

## 2020-11-09 ENCOUNTER — Encounter (HOSPITAL_COMMUNITY): Payer: Self-pay | Admitting: Cardiology

## 2020-11-09 MED FILL — Cefazolin Sodium-Dextrose IV Solution 2 GM/100ML-4%: INTRAVENOUS | Qty: 100 | Status: AC

## 2020-11-09 MED FILL — Fentanyl Citrate Preservative Free (PF) Inj 100 MCG/2ML: INTRAMUSCULAR | Qty: 2 | Status: AC

## 2020-11-10 NOTE — Anesthesia Postprocedure Evaluation (Signed)
Anesthesia Post Note  Patient: Milinda Antis  Procedure(s) Performed: ATRIAL FIBRILLATION ABLATION (N/A )     Patient location during evaluation: PACU Anesthesia Type: General Level of consciousness: awake and alert Pain management: pain level controlled Vital Signs Assessment: post-procedure vital signs reviewed and stable Respiratory status: spontaneous breathing, nonlabored ventilation, respiratory function stable and patient connected to nasal cannula oxygen Cardiovascular status: blood pressure returned to baseline and stable Postop Assessment: no apparent nausea or vomiting Anesthetic complications: no   No complications documented.  Last Vitals:  Vitals:   11/08/20 1656 11/08/20 1744  BP: (!) 171/67 (!) 173/70  Pulse: 67 74  Resp: 14 (!) 0  Temp:    SpO2: 100% 99%    Last Pain:  Vitals:   11/09/20 1303  TempSrc:   PainSc: 0-No pain   Pain Goal: Patients Stated Pain Goal: 2 (11/08/20 0932)                 Kennieth Rad

## 2020-11-13 ENCOUNTER — Other Ambulatory Visit: Payer: Self-pay | Admitting: Endocrinology

## 2020-11-22 ENCOUNTER — Other Ambulatory Visit: Payer: Self-pay | Admitting: Endocrinology

## 2020-11-30 ENCOUNTER — Other Ambulatory Visit (INDEPENDENT_AMBULATORY_CARE_PROVIDER_SITE_OTHER): Payer: Medicare Other

## 2020-11-30 ENCOUNTER — Other Ambulatory Visit: Payer: Self-pay

## 2020-11-30 DIAGNOSIS — E782 Mixed hyperlipidemia: Secondary | ICD-10-CM | POA: Diagnosis not present

## 2020-11-30 DIAGNOSIS — E039 Hypothyroidism, unspecified: Secondary | ICD-10-CM

## 2020-11-30 DIAGNOSIS — Z794 Long term (current) use of insulin: Secondary | ICD-10-CM | POA: Diagnosis not present

## 2020-11-30 DIAGNOSIS — E1165 Type 2 diabetes mellitus with hyperglycemia: Secondary | ICD-10-CM

## 2020-11-30 LAB — LIPID PANEL
Cholesterol: 209 mg/dL — ABNORMAL HIGH (ref 0–200)
HDL: 38.2 mg/dL — ABNORMAL LOW (ref >39.00)
LDL Cholesterol: 145 mg/dL — ABNORMAL HIGH (ref 0–99)
NonHDL: 171.24
Total CHOL/HDL Ratio: 5
Triglycerides: 133 mg/dL (ref 0.0–149.0)
VLDL: 26.6 mg/dL (ref 0.0–40.0)

## 2020-11-30 LAB — COMPREHENSIVE METABOLIC PANEL
ALT: 21 U/L (ref 0–53)
AST: 20 U/L (ref 0–37)
Albumin: 4.3 g/dL (ref 3.5–5.2)
Alkaline Phosphatase: 29 U/L — ABNORMAL LOW (ref 39–117)
BUN: 30 mg/dL — ABNORMAL HIGH (ref 6–23)
CO2: 29 mEq/L (ref 19–32)
Calcium: 10.2 mg/dL (ref 8.4–10.5)
Chloride: 103 mEq/L (ref 96–112)
Creatinine, Ser: 1.85 mg/dL — ABNORMAL HIGH (ref 0.40–1.50)
GFR: 34.79 mL/min — ABNORMAL LOW (ref >60.00)
Glucose, Bld: 248 mg/dL — ABNORMAL HIGH (ref 70–99)
Potassium: 4.5 mEq/L (ref 3.5–5.1)
Sodium: 138 mEq/L (ref 135–145)
Total Bilirubin: 0.6 mg/dL (ref 0.2–1.2)
Total Protein: 7.2 g/dL (ref 6.0–8.3)

## 2020-11-30 LAB — T4, FREE: Free T4: 1.13 ng/dL (ref 0.60–1.60)

## 2020-11-30 LAB — TSH: TSH: 2.49 u[IU]/mL (ref 0.35–4.50)

## 2020-11-30 LAB — HEMOGLOBIN A1C: Hgb A1c MFr Bld: 8.7 % — ABNORMAL HIGH (ref 4.6–6.5)

## 2020-12-05 ENCOUNTER — Ambulatory Visit (INDEPENDENT_AMBULATORY_CARE_PROVIDER_SITE_OTHER): Payer: Medicare Other | Admitting: Endocrinology

## 2020-12-05 ENCOUNTER — Encounter: Payer: Self-pay | Admitting: Endocrinology

## 2020-12-05 ENCOUNTER — Other Ambulatory Visit: Payer: Self-pay

## 2020-12-05 VITALS — BP 118/74 | HR 63 | Ht 71.0 in | Wt 229.2 lb

## 2020-12-05 DIAGNOSIS — Z794 Long term (current) use of insulin: Secondary | ICD-10-CM

## 2020-12-05 DIAGNOSIS — E1165 Type 2 diabetes mellitus with hyperglycemia: Secondary | ICD-10-CM

## 2020-12-05 DIAGNOSIS — E782 Mixed hyperlipidemia: Secondary | ICD-10-CM | POA: Diagnosis not present

## 2020-12-05 DIAGNOSIS — E039 Hypothyroidism, unspecified: Secondary | ICD-10-CM

## 2020-12-05 NOTE — Patient Instructions (Signed)
Take Humalog BEFORE EACH MEAL 5-8 units before starting to eat, take it even if sugar is normal   if eating any carbs/soup/salads skip Humalog  10 units if fast food  Check blood sugars on waking 7 up days a week  Also check blood sugars about 2 hours after meals and do this after different meals by rotation  Recommended blood sugar levels on waking up are 90-130 and about 2 hours after meal is 130-160  Please bring your blood sugar monitor to each visit, thank you

## 2020-12-05 NOTE — Progress Notes (Signed)
Patient ID: Steven Maldonado, male   DOB: [birth year], 80 y.o.   MRN: [id number]          Reason for Appointment: Endocrinology follow-up     History of Present Illness:     DIABETES:       Date of diagnosis of type 2 diabetes mellitus:  1999      Background history:   He had been treated with metformin and glipizide for several years Metformin was stopped about 4 years ago because of renal dysfunction Presumably because of poor control he was started on insulin around the year 2009 He had been mostly on Lantus which was subsequently changed to [employer] and he thinks that Lantus worked better Also has been on Humalog 3-4 times a day for some time  Recent history:    INSULIN regimen is:  TRESIBA 16 at bedtime, Humalog 5 units 3 times a day,   Non-insulin hypoglycemic drugs the patient is taking are: on Ozempic 0.5 mg weekly, glipizide ER 10 mg daily  Most recent A1c is 8.7, about the same  Current management, blood sugar patterns and problems identified:   He was restarted on Ozempic after his last visit when he had stopped it on his own  However has taken 0.5 dose only once so far  Overall blood sugars are much better compared to his last visit but data is somewhat incomplete especially in the last week  Blood sugars are showing the following patterns as discussed below  He likely has still high postprandial readings causing high A1c  Yesterday he had a fast food hamburger and did not take his mealtime insulin and blood sugar at about 7 PM was around 350  Although he does not think he is taking Humalog at bedtime he did have morning hypoglycemia preceded by significant hyperglycemia on the night of 1/12  He is generally not adjusting his Humalog much based on what he is eating, however occasionally his meals are small such as soup and crackers  Frequently when his blood sugars are near normal he will skip the Humalog   Side effects from medications have been: None      Meal times are:  Breakfast is at 8 AM, dinnertime 5-6 PM  Interpretation and data from freestyle libre version 2 for the last 2 weeks   Summary of patterns: Blood sugars are not being checked regularly with only rare available 47 % of the time  Generally blood sugars are higher in the afternoons and late in the evenings  Blood sugar usually come back to near normal early morning after 6 AM  Data is incomplete for early afternoon  Postprandial hyperglycemia after dinner is inconsistent but appeared to be getting higher in the first week compared to the last couple of days available  Blood sugar was markedly increased late night on 1/12 and also late afternoon 1/24  Hypoglycemia documented only early morning on 1/12 preceded by hyperglycemia  Fasting readings appear to be mostly near normal, lowest 82  CGM use % of time  47  2-week average/GV    Time in range    70    %  % Time Above 180  27  % Time above 250   % Time Below 70 3     PRE-MEAL Fasting Lunch Dinner Bedtime Overall  Glucose range:       Averages:  105  123  172  175    POST-MEAL PC Breakfast PC Lunch PC Dinner  Glucose  range:     Averages: ?  193  180   PREVIOUS data:           CGM use % of time  64  2-week average/GV  189+/-43  Time in range       44%  % Time Above 180  29  % Time above 250  23  % Time Below 70  4     PRE-MEAL Fasting Lunch Dinner Bedtime Overall  Glucose range:       Averages:  139  216  193  221  189   POST-MEAL PC Breakfast PC Lunch PC Dinner  Glucose range:     Averages:  153  212  211     Dietician visit, most recent: 9/19  Weight history:  Wt Readings from Last 3 Encounters:  12/05/20 229 lb 3.2 oz (104 kg)  11/08/20 232 lb (105.2 kg)  10/24/20 232 lb (105.2 kg)    Glycemic control:   Lab Results  Component Value Date   HGBA1C 8.7 (H) 11/30/2020   HGBA1C 8.8 (H) 08/23/2020   HGBA1C 7.3 (A) 05/25/2020   Lab Results  Component Value Date   MICROALBUR 10  05/25/2020   LDLCALC 145 (H) 11/30/2020   CREATININE 1.85 (H) 11/30/2020   Lab Results  Component Value Date   MICRALBCREAT <30 05/25/2020    Lab Results  Component Value Date   FRUCTOSAMINE 268 06/26/2018   HYPOTHYROIDISM and other problems: See review of systems   Lab on 11/30/2020  Component Date Value Ref Range Status  . Free T4 11/30/2020 1.13  0.60 - 1.60 ng/dL Final   Comment: Specimens from patients who are undergoing biotin therapy and /or ingesting biotin supplements may contain high levels of biotin.  The higher biotin concentration in these specimens interferes with this Free T4 assay.  Specimens that contain high levels  of biotin may cause false high results for this Free T4 assay.  Please interpret results in light of the total clinical presentation of the patient.    [name] Kitchen TSH 11/30/2020 2.49  0.35 - 4.50 uIU/mL Final  . Cholesterol 11/30/2020 209* 0 - 200 mg/dL Final   ATP III Classification       Desirable:  < 200 mg/dL               Borderline High:  200 - 239 mg/dL          High:  > = 240 mg/dL  . Triglycerides 11/30/2020 133.0  0.0 - 149.0 mg/dL Final   Normal:  <150 mg/dLBorderline High:  150 - 199 mg/dL  . HDL 11/30/2020 38.20* >39.00 mg/dL Final  . VLDL 11/30/2020 26.6  0.0 - 40.0 mg/dL Final  . LDL Cholesterol 11/30/2020 145* 0 - 99 mg/dL Final  . Total CHOL/HDL Ratio 11/30/2020 5   Final                  Men          Women1/2 Average Risk     3.4          3.3Average Risk          5.0          4.42X Average Risk          9.6          7.13X Average Risk          15.0          11.0                      .  NonHDL 11/30/2020 171.24   Final   NOTE:  Non-HDL goal should be 30 mg/dL higher than patient's LDL goal (i.e. LDL goal of < 70 mg/dL, would have non-HDL goal of < 100 mg/dL)  . Sodium 11/30/2020 138  135 - 145 mEq/L Final  . Potassium 11/30/2020 4.5  3.5 - 5.1 mEq/L Final  . Chloride 11/30/2020 103  96 - 112 mEq/L Final  . CO2 11/30/2020 29  19 - 32 mEq/L  Final  . Glucose, Bld 11/30/2020 248* 70 - 99 mg/dL Final  . BUN 11/30/2020 30* 6 - 23 mg/dL Final  . Creatinine, Ser 11/30/2020 1.85* 0.40 - 1.50 mg/dL Final  . Total Bilirubin 11/30/2020 0.6  0.2 - 1.2 mg/dL Final  . Alkaline Phosphatase 11/30/2020 29* 39 - 117 U/L Final  . AST 11/30/2020 20  0 - 37 U/L Final  . ALT 11/30/2020 21  0 - 53 U/L Final  . Total Protein 11/30/2020 7.2  6.0 - 8.3 g/dL Final  . Albumin 11/30/2020 4.3  3.5 - 5.2 g/dL Final  . GFR 11/30/2020 34.79* >60.00 mL/min Final   Calculated using the CKD-EPI Creatinine Equation (2021)  . Calcium 11/30/2020 10.2  8.4 - 10.5 mg/dL Final  . Hgb A1c MFr Bld 11/30/2020 8.7* 4.6 - 6.5 % Final   Glycemic Control Guidelines for People with Diabetes:Non Diabetic:  <6%Goal of Therapy: <7%Additional Action Suggested:  >8%     Allergies as of 12/05/2020   No Known Allergies     Medication List       Accurate as of December 05, 2020  2:37 PM. If you have any questions, ask your nurse or doctor.        amiodarone 200 MG tablet Commonly known as: PACERONE Take 200 mg by mouth daily.   Clickfine Pen Needles 32G X 4 MM Misc Generic drug: Insulin Pen Needle USE FOUR TIMES DAILY FOR DIABETES   clopidogrel 75 MG tablet Commonly known as: PLAVIX Take 75 mg by mouth daily.   dabigatran 150 MG Caps capsule Commonly known as: Pradaxa Take 1 capsule (150 mg total) by mouth every 12 (twelve) hours.   docusate sodium 100 MG capsule Commonly known as: COLACE Take 300 mg by mouth daily.   Evolocumab 140 MG/ML Sosy Inject 140 mg into the skin every 14 (fourteen) days.   ezetimibe 10 MG tablet Commonly known as: ZETIA TAKE 1 TABLET BY MOUTH EVERY DAY   Fenofibric Acid 135 MG Cpdr Take 1 capsule by mouth daily. What changed: how much to take   FreeStyle Lite Devi 1 each by Does not apply route 2 (two) times daily.   FREESTYLE LITE test strip Generic drug: glucose blood 1 EACH BY OTHER ROUTE 4 (FOUR) TIMES DAILY - BEFORE  MEALS AND AT BEDTIME.   furosemide 20 MG tablet Commonly known as: LASIX Take 1 tablet (20 mg total) by mouth every other day. What changed: when to take this   glipiZIDE 10 MG 24 hr tablet Commonly known as: GLUCOTROL XL TAKE 1 TABLET (10 MG TOTAL) BY MOUTH DAILY WITH BREAKFAST.   insulin lispro 100 UNIT/ML KwikPen Commonly known as: HumaLOG KwikPen Inject 4-6 units under the skin up to three times daily if eating sweets or high carb foods. What changed:   how much to take  how to take this  when to take this  additional instructions   levothyroxine 200 MCG tablet Commonly known as: SYNTHROID TAKE 1 TABLET BY MOUTH DAILY MONDAY-SATURDAY  TAKE 1/2 TABLET  ON SUNDAY   metoprolol tartrate 50 MG tablet Commonly known as: LOPRESSOR TAKE ONE AND ONE-HALF TABLET BY MOUTH TWICE A DAY What changed: See the new instructions.   mirabegron ER 25 MG Tb24 tablet Commonly known as: MYRBETRIQ Take 1 tablet (25 mg total) by mouth daily.   mupirocin ointment 2 % Commonly known as: BACTROBAN Apply to great toe wound daily, with gauze and tape What changed:   how much to take  how to take this  when to take this   Ozempic (0.25 or 0.5 MG/DOSE) 2 MG/1.5ML Sopn Generic drug: Semaglutide(0.25 or 0.5MG /DOS) Inject 0.5 mg into the skin once a week. What changed:   how much to take  when to take this  additional instructions   Tresiba FlexTouch 200 UNIT/ML FlexTouch Pen Generic drug: insulin degludec Inject 26 Units into the skin at bedtime. What changed: how much to take       Allergies: No Known Allergies  Past Medical History:  Diagnosis Date  . Chronic diastolic CHF (congestive heart failure) ([place])   . CKD (chronic kidney disease), stage III ([place])   . Constipation   . Coronary artery disease    a. Cath February 2012 in [place] [facility], occluded RCA with collaterals  . DM type 2 (diabetes mellitus, type 2) ([place])   . Essential hypertension   . Hyperlipidemia   .  Neuropathy    feet  . Pacemaker    a. symptomatic brady after TAVR s/p MDT PPM by Dr. Curt Bears 12/04/17  . Persistent atrial fibrillation ([place])   . PONV (postoperative nausea and vomiting)    after valve surgery  . PVD (peripheral vascular disease) ([place])    a. s/p R popliteal artery stenosis tx with drug-coated balloon 05/2014, followed by Dr. Fletcher Anon.  . 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 Severe aortic stenosis    a. 12/02/17: s/p TAVR  . Skin cancer   . Sleep apnea with use of continuous positive airway pressure (CPAP)    04-11-11 AHI was 32.9 and titrated to 15 cm H20, DME is [facility]  . Subclinical hypothyroidism     Past Surgical History:  Procedure Laterality Date  . ABDOMINAL ANGIOGRAM N/A 06/08/2014   Procedure: ABDOMINAL ANGIOGRAM;  Surgeon: Wellington Hampshire, MD;  Location[facility] CATH LAB;  Service: Cardiovascular;  Laterality: N/A;  . ABDOMINAL AORTOGRAM N/A 04/09/2018   Procedure: ABDOMINAL AORTOGRAM;  Surgeon: Conrad Cuba, MD;  Location: [place] CV LAB;  Service: Cardiovascular;  Laterality: N/A;  . ABDOMINAL AORTOGRAM W/LOWER EXTREMITY N/A 08/23/2020   Procedure: ABDOMINAL AORTOGRAM W/LOWER EXTREMITY;  Surgeon: Wellington Hampshire, MD;  Location: [place] CV LAB;  Service: Cardiovascular;  Laterality: N/A;  . AMPUTATION Left 04/17/2018   Procedure: LEFT FOOT 3RD RAY AMPUTATION;  Surgeon: Newt Minion, MD;  Location: [place];  Service: Orthopedics;  Laterality: Left;  . AMPUTATION Left 05/28/2018   Procedure: LEFT AMPUTATION BELOW KNEE;  Surgeon: Wylene Simmer, MD;  Location: [place];  Service: Orthopedics;  Laterality: Left;  . APPENDECTOMY  1965  . ATRIAL FIBRILLATION ABLATION N/A 11/08/2020   Procedure: ATRIAL FIBRILLATION ABLATION;  Surgeon: Constance Haw, MD;  Location: [place] CV LAB;  Service: Cardiovascular;  Laterality: N/A;  . BELOW  KNEE LEG AMPUTATION Left 05/28/2018  . CARDIAC CATHETERIZATION  12/2010  . CARDIOVERSION  07/2011  . CARDIOVERSION N/A 04/18/2014  Procedure: CARDIOVERSION;  Surgeon: Dorothy Spark, MD;  Location: [place];  Service: Cardiovascular;  Laterality: N/A;  . CARDIOVERSION N/A 11/03/2015   Procedure: CARDIOVERSION;  Surgeon: Lelon Perla, MD;  Location: [facility] ENDOSCOPY;  Service: Cardiovascular;  Laterality: N/A;  . CARDIOVERSION N/A 05/08/2017   Procedure: CARDIOVERSION;  Surgeon: Dorothy Spark, MD;  Location: [place];  Service: Cardiovascular;  Laterality: N/A;  . CARDIOVERSION N/A 07/28/2017   Procedure: CARDIOVERSION;  Surgeon: Dorothy Spark, MD;  Location: [facility] ENDOSCOPY;  Service: Cardiovascular;  Laterality: N/A;  . CARDIOVERSION N/A 02/08/2020   Procedure: CARDIOVERSION;  Surgeon: Buford Dresser, MD;  Location: [facility] ENDOSCOPY;  Service: Cardiovascular;  Laterality: N/A;  . CARDIOVERSION N/A 03/15/2020   Procedure: CARDIOVERSION;  Surgeon: Acie Fredrickson Wonda Cheng, MD;  Location[facility] ENDOSCOPY;  Service: Cardiovascular;  Laterality: N/A;  . CARDIOVERSION N/A 09/20/2020   Procedure: CARDIOVERSION;  Surgeon: Werner Lean, MD;  Location: [facility] ENDOSCOPY;  Service: Cardiovascular;  Laterality: N/A;  . LOWER EXTREMITY ANGIOGRAM N/A 06/08/2014   Procedure: LOWER EXTREMITY ANGIOGRAM;  Surgeon: Wellington Hampshire, MD;  Location: [facility] CATH LAB;  Service: Cardiovascular;  Laterality: N/A;  . LOWER EXTREMITY ANGIOGRAPHY Left 04/09/2018   Procedure: Lower Extremity Angiography;  Surgeon: Conrad [place], MD;  Location: [place] CV LAB;  Service: Cardiovascular;  Laterality: Left;  . PACEMAKER IMPLANT N/A 12/04/2017   Procedure: PACEMAKER IMPLANT;  Surgeon: Constance Haw, MD;  Location: [place] CV LAB;  Service: Cardiovascular;  Laterality: N/A;  . PERIPHERAL VASCULAR ATHERECTOMY  08/23/2020   Procedure: PERIPHERAL VASCULAR ATHERECTOMY;  Surgeon: Wellington Hampshire, MD;  Location:  [place] CV LAB;  Service: Cardiovascular;;  . PERIPHERAL VASCULAR BALLOON ANGIOPLASTY Left 04/09/2018   Procedure: PERIPHERAL VASCULAR BALLOON ANGIOPLASTY;  Surgeon: Conrad [place], MD;  Location: [place] CV LAB;  Service: Cardiovascular;  Laterality: Left;  SFA  . POPLITEAL ARTERY ANGIOPLASTY Right 06/08/2014   Archie Endo 06/08/2014  . RIGHT/LEFT HEART CATH AND CORONARY ANGIOGRAPHY N/A 10/08/2017   Procedure: RIGHT/LEFT HEART CATH AND CORONARY ANGIOGRAPHY;  Surgeon: Burnell Blanks, MD;  Location: [place] CV LAB;  Service: Cardiovascular;  Laterality: N/A;  . SKIN CANCER EXCISION Bilateral    "have had them cut off back of neck X 2; off left upper arm; right wrist, near right shoulder blade" (06/08/2014)  . TEE WITHOUT CARDIOVERSION N/A 12/02/2017   Procedure: TRANSESOPHAGEAL ECHOCARDIOGRAM (TEE);  Surgeon: Burnell Blanks, MD;  Location: [place];  Service: Open Heart Surgery;  Laterality: N/A;  . TEMPORARY PACEMAKER N/A 12/04/2017   Procedure: TEMPORARY PACEMAKER;  Surgeon: Leonie Man, MD;  Location: [place] CV LAB;  Service: Cardiovascular;  Laterality: N/A;  . TRANSCATHETER AORTIC VALVE REPLACEMENT, TRANSFEMORAL N/A 12/02/2017   Procedure: TRANSCATHETER AORTIC VALVE REPLACEMENT, TRANSFEMORAL;  Surgeon: Burnell Blanks, MD;  Location: [place];  Service: Open Heart Surgery;  Laterality: N/A;  using Edwards Sapien 3 Transcatheter Heart Valve size 48mm    Family History  Problem Relation Age of Onset  . Diabetes Mother   . Heart attack Mother   . Hypertension Mother   . Heart attack Father   . Heart failure Father   . Hypertension Father   . Diabetes Father   . Diabetes Sister   . Diabetes Brother   . Diabetes Other   . Diabetes Daughter        TYPE ll  . Heart Problems Daughter   . Hypertension Sister   . Hypertension Brother   . Stroke  Brother     Social History:  reports that he quit smoking about 48 years ago. His smoking use included cigarettes.  He has a 28.00 pack-year smoking history. He has never used smokeless tobacco. He reports that he does not drink alcohol and does not use drugs.   Review of Systems   Lipid history: He is supposed to be on 80 mg atorvastatin and ezetimibe LDL appears to be higher now, has history of CAD    Lab Results  Component Value Date   CHOL 209 (H) 11/30/2020   CHOL 161 01/24/2020   CHOL 219 (H) 05/31/2019   Lab Results  Component Value Date   HDL 38.20 (L) 11/30/2020   HDL 34 (L) 01/24/2020   HDL 36 (L) 05/31/2019   Lab Results  Component Value Date   LDLCALC 145 (H) 11/30/2020   LDLCALC 106 (H) 01/24/2020   LDLCALC 142 (H) 05/31/2019   Lab Results  Component Value Date   TRIG 133.0 11/30/2020   TRIG 113 01/24/2020   TRIG 264 (H) 05/31/2019   Lab Results  Component Value Date   CHOLHDL 5 11/30/2020   CHOLHDL 4.7 01/24/2020   CHOLHDL 6.1 (H) 05/31/2019   Lab Results  Component Value Date   LDLDIRECT 123.0 12/03/2019   LDLDIRECT 77.0 08/18/2018            Hypertension: Has been controlled with only antihypertensive being metoprolol, not on ACE inhibitor  BP Readings from Last 3 Encounters:  12/05/20 118/74  11/08/20 (!) 173/70  11/02/20 (!) 149/71    Most recent eye exam was in 12/18 ?  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 History of symptomatic neuropathy  He has chronic kidney disease of unclear etiology, is followed by nephrologist periodically   Lab Results  Component Value Date   CREATININE 1.85 (H) 11/30/2020   CREATININE 1.83 (H) 11/08/2020    CREATININE 1.93 (H) 11/02/2020       Physical Examination:  BP 118/74   Pulse 63   Ht 5\' 11"  (1.803 m)   Wt 229 lb 3.2 oz (104 kg)   SpO2 98%   BMI 31.97 kg/m          ASSESSMENT:  Diabetes type 2, insulin requiring with moderate obesity  See history of present illness for detailed discussion of current diabetes management, blood sugar patterns and problems identified  A1c still high at 8.7  Blood sugars overall are improving although A1c has not come down He still has likely postprandial hyperglycemia which is not well documented because of inadequate monitoring  Blood sugar patterns from his [employer] and problems with his management are discussed above  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: Has been on 200 mcg levothyroxine, 6-1/2 days a week TSH is consistently controlled  LIPIDS: Not controlled, he will need to check which medication he is not taking   PLAN:   Continue 0.5 mg Ozempic and consider increasing the dose on the next visit He needs to take 8 to 10 units of Humalog for larger meals and make  sure he takes it before eating If he is eating low carbohydrate meals like soup and crackers he can skip the Humalog No change in [place] Consistent monitoring of blood sugar 4 times a day   There are no Patient Instructions on file for this visit.     Elayne Snare 12/05/2020, 2:37 PM   Note: This office note was prepared with Dragon voice recognition system technology. Any transcriptional errors that result from this process are unintentional.

## 2020-12-06 ENCOUNTER — Other Ambulatory Visit: Payer: Self-pay | Admitting: *Deleted

## 2020-12-06 ENCOUNTER — Telehealth: Payer: Self-pay | Admitting: Endocrinology

## 2020-12-06 ENCOUNTER — Ambulatory Visit (HOSPITAL_COMMUNITY)
Admission: RE | Admit: 2020-12-06 | Discharge: 2020-12-06 | Disposition: A | Discharge status: Home / Self Care | Payer: Medicare Other | Source: Ambulatory Visit | Attending: Nurse Practitioner | Admitting: Nurse Practitioner

## 2020-12-06 VITALS — BP 116/56 | HR 65 | Ht 71.0 in | Wt 230.0 lb

## 2020-12-06 DIAGNOSIS — D6869 Other thrombophilia: Secondary | ICD-10-CM

## 2020-12-06 DIAGNOSIS — E785 Hyperlipidemia, unspecified: Secondary | ICD-10-CM | POA: Diagnosis not present

## 2020-12-06 DIAGNOSIS — Z8249 Family history of ischemic heart disease and other diseases of the circulatory system: Secondary | ICD-10-CM | POA: Insufficient documentation

## 2020-12-06 DIAGNOSIS — Z7902 Long term (current) use of antithrombotics/antiplatelets: Secondary | ICD-10-CM | POA: Insufficient documentation

## 2020-12-06 DIAGNOSIS — I4819 Other persistent atrial fibrillation: Secondary | ICD-10-CM | POA: Diagnosis not present

## 2020-12-06 DIAGNOSIS — I739 Peripheral vascular disease, unspecified: Secondary | ICD-10-CM | POA: Insufficient documentation

## 2020-12-06 DIAGNOSIS — Z794 Long term (current) use of insulin: Secondary | ICD-10-CM | POA: Diagnosis not present

## 2020-12-06 DIAGNOSIS — I5022 Chronic systolic (congestive) heart failure: Secondary | ICD-10-CM | POA: Insufficient documentation

## 2020-12-06 DIAGNOSIS — I2510 Atherosclerotic heart disease of native coronary artery without angina pectoris: Secondary | ICD-10-CM | POA: Insufficient documentation

## 2020-12-06 DIAGNOSIS — Z87891 Personal history of nicotine dependence: Secondary | ICD-10-CM | POA: Diagnosis not present

## 2020-12-06 DIAGNOSIS — E1122 Type 2 diabetes mellitus with diabetic chronic kidney disease: Secondary | ICD-10-CM | POA: Diagnosis not present

## 2020-12-06 DIAGNOSIS — I130 Hypertensive heart and chronic kidney disease with heart failure and stage 1 through stage 4 chronic kidney disease, or unspecified chronic kidney disease: Secondary | ICD-10-CM | POA: Diagnosis not present

## 2020-12-06 DIAGNOSIS — Z79899 Other long term (current) drug therapy: Secondary | ICD-10-CM | POA: Insufficient documentation

## 2020-12-06 DIAGNOSIS — Z7901 Long term (current) use of anticoagulants: Secondary | ICD-10-CM | POA: Diagnosis not present

## 2020-12-06 DIAGNOSIS — N183 Chronic kidney disease, stage 3 unspecified: Secondary | ICD-10-CM | POA: Diagnosis not present

## 2020-12-06 MED ORDER — GLIPIZIDE ER 10 MG PO TB24: 10.0000 mg | ORAL_TABLET | Freq: Every day | ORAL | 1 refills | Status: DC

## 2020-12-06 NOTE — Telephone Encounter (Signed)
Pt called with a question regarding his medication wanting to speak to Dr Ronnie Derby nurse.  Ph# [phone]

## 2020-12-06 NOTE — Progress Notes (Signed)
Primary Care Physician: Lorrene Reid, [name] Referring Physician: Dr. Jenene Slicker is a 80 y.o. 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 He was referred here to get scheduled for cardioversion. He is also scheduled to see Dr. Curt Bears later in November to discuss ablation. He  Is on pradaxa for a CHA2DS2VASc of at least 6. He had a successful orbital atherectomy and drug-coated balloon angioplasty to the right distal SFA and popliteal arteries 10/13. No complications.   On follow up today, patient is s/p DCCV on 09/20/20 and s/p afib ablation on 11/08/20 with Dr Curt Bears.  He is in AV dual paced rhythm today. No swallowing or groin  issues. He feels well.   Today, he denies symptoms of palpitations, chest pain, shortness of breath, orthopnea, PND, lower extremity edema, dizziness, presyncope, syncope, or neurologic sequela. The patient is tolerating medications without difficulties and is otherwise without complaint today.   Past Medical History:  Diagnosis Date  . Chronic diastolic CHF (congestive heart failure) ([place])   . CKD (chronic kidney disease), stage III ([place])   . Constipation   . Coronary artery disease    a. Cath February 2012 in [place] [facility], occluded RCA with collaterals  . DM type 2 (diabetes mellitus, type 2) ([place])   . Essential hypertension   . Hyperlipidemia   . Neuropathy    feet  . Pacemaker    a. symptomatic brady after TAVR s/p MDT PPM by Dr. Curt Bears 12/04/17  . Persistent atrial fibrillation ([place])   . PONV (postoperative nausea and vomiting)    after valve surgery  . PVD (peripheral vascular disease) ([place])    a. s/p R popliteal artery stenosis tx with drug-coated balloon  05/2014, followed by Dr. Fletcher Anon.  . 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 Severe aortic stenosis    a. 12/02/17: s/p TAVR  . Skin cancer   . Sleep apnea with use of continuous positive airway pressure (CPAP)    04-11-11 AHI was 32.9 and titrated to 15 cm H20, DME is [facility]  . Subclinical hypothyroidism    Past Surgical History:  Procedure Laterality Date  . ABDOMINAL ANGIOGRAM N/A 06/08/2014   Procedure: ABDOMINAL ANGIOGRAM;  Surgeon: [place] Hampshire, MD;  Location: [facility] CATH LAB;  Service: Cardiovascular;  Laterality: N/A;  . ABDOMINAL AORTOGRAM N/A 04/09/2018   Procedure: ABDOMINAL AORTOGRAM;  Surgeon: Conrad Purvis, MD;  Location: [place] CV LAB;  Service: Cardiovascular;  Laterality: N/A;  . ABDOMINAL AORTOGRAM W/LOWER EXTREMITY N/A 08/23/2020   Procedure: ABDOMINAL AORTOGRAM W/LOWER EXTREMITY;  Surgeon: [place] Hampshire, MD;  Location: [place] CV LAB;  Service: Cardiovascular;  Laterality: N/A;  . AMPUTATION Left 04/17/2018   Procedure: LEFT FOOT 3RD RAY AMPUTATION;  Surgeon: Newt Minion, MD;  Location: [place];  Service: Orthopedics;  Laterality: Left;  . AMPUTATION Left 05/28/2018   Procedure: LEFT AMPUTATION BELOW KNEE;  Surgeon: Wylene Simmer, MD;  Location: [place];  Service: Orthopedics;  Laterality: Left;  . APPENDECTOMY  1965  . ATRIAL FIBRILLATION ABLATION N/A 11/08/2020   Procedure: ATRIAL FIBRILLATION ABLATION;  Surgeon: Constance Haw, MD;  Location: [place] CV LAB;  Service: Cardiovascular;  Laterality: N/A;  . 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 CARDIAC CATHETERIZATION  12/2010  . CARDIOVERSION  07/2011  . CARDIOVERSION N/A 04/18/2014   Procedure: CARDIOVERSION;  Surgeon: Dorothy Spark, MD;  Location: [place];  Service: Cardiovascular;  Laterality: N/A;  . CARDIOVERSION N/A 11/03/2015   Procedure: CARDIOVERSION;  Surgeon:  Lelon Perla, MD;  Location: [facility] ENDOSCOPY;  Service: Cardiovascular;  Laterality: N/A;  . CARDIOVERSION N/A 05/08/2017   Procedure: CARDIOVERSION;  Surgeon: Dorothy Spark, MD;  Location: [facility] ENDOSCOPY;  Service: Cardiovascular;  Laterality: N/A;  . CARDIOVERSION N/A 07/28/2017   Procedure: CARDIOVERSION;  Surgeon: Dorothy Spark, MD;  Location: [facility] ENDOSCOPY;  Service: Cardiovascular;  Laterality: N/A;  . CARDIOVERSION N/A 02/08/2020   Procedure: CARDIOVERSION;  Surgeon: Buford Dresser, MD;  Location: [facility] ENDOSCOPY;  Service: Cardiovascular;  Laterality: N/A;  . CARDIOVERSION N/A 03/15/2020   Procedure: CARDIOVERSION;  Surgeon: Acie Fredrickson Wonda Cheng, MD;  Location: [facility] ENDOSCOPY;  Service: Cardiovascular;  Laterality: N/A;  . CARDIOVERSION N/A 09/20/2020   Procedure: CARDIOVERSION;  Surgeon: Werner Lean, MD;  Location: [facility] ENDOSCOPY;  Service: Cardiovascular;  Laterality: N/A;  . LOWER EXTREMITY ANGIOGRAM N/A 06/08/2014   Procedure: LOWER EXTREMITY ANGIOGRAM;  Surgeon: [place] Hampshire, MD;  Location: [facility] CATH LAB;  Service: Cardiovascular;  Laterality: N/A;  . LOWER EXTREMITY ANGIOGRAPHY Left 04/09/2018   Procedure: Lower Extremity Angiography;  Surgeon: Conrad [place], MD;  Location: [place] CV LAB;  Service: Cardiovascular;  Laterality: Left;  . PACEMAKER IMPLANT N/A 12/04/2017   Procedure: PACEMAKER IMPLANT;  Surgeon: Constance Haw, MD;  Location: [place] CV LAB;  Service: Cardiovascular;  Laterality: N/A;  . PERIPHERAL VASCULAR ATHERECTOMY  08/23/2020   Procedure: PERIPHERAL VASCULAR ATHERECTOMY;  Surgeon: [place] Hampshire, MD;  Location: [place] CV LAB;  Service: Cardiovascular;;  . PERIPHERAL VASCULAR BALLOON ANGIOPLASTY Left 04/09/2018   Procedure: PERIPHERAL VASCULAR BALLOON ANGIOPLASTY;  Surgeon: Conrad , MD;  Location: [place] CV LAB;  Service: Cardiovascular;  Laterality: Left;  SFA  . POPLITEAL ARTERY ANGIOPLASTY Right 06/08/2014   Archie Endo  06/08/2014  . RIGHT/LEFT HEART CATH AND CORONARY ANGIOGRAPHY N/A 10/08/2017   Procedure: RIGHT/LEFT HEART CATH AND CORONARY ANGIOGRAPHY;  Surgeon: Burnell Blanks, MD;  Location: [place] CV LAB;  Service: Cardiovascular;  Laterality: N/A;  . SKIN CANCER EXCISION Bilateral    "have had them cut off back of neck X 2; off left upper arm; right wrist, near right shoulder blade" (06/08/2014)  . TEE WITHOUT CARDIOVERSION N/A 12/02/2017   Procedure: TRANSESOPHAGEAL ECHOCARDIOGRAM (TEE);  Surgeon: Burnell Blanks, MD;  Location: [place];  Service: Open Heart Surgery;  Laterality: N/A;  . TEMPORARY PACEMAKER N/A 12/04/2017   Procedure: TEMPORARY PACEMAKER;  Surgeon: Leonie Man, MD;  Location: [place] CV LAB;  Service: Cardiovascular;  Laterality: N/A;  . TRANSCATHETER AORTIC VALVE REPLACEMENT, TRANSFEMORAL N/A 12/02/2017   Procedure: TRANSCATHETER AORTIC VALVE REPLACEMENT, TRANSFEMORAL;  Surgeon: Burnell Blanks, MD;  Location: [place];  Service: Open Heart Surgery;  Laterality: N/A;  using Edwards Sapien 3 Transcatheter Heart Valve size 35mm    Current Outpatient Medications  Medication Sig Dispense Refill  . amiodarone (PACERONE) 200 MG tablet Take 200 mg by mouth daily.    . Blood Glucose Monitoring Suppl (FREESTYLE LITE) DEVI 1 each by Does not apply route 2 (two) times daily. 1 each 0  . Choline  Fenofibrate (FENOFIBRIC ACID) 135 MG CPDR Take 1 capsule by mouth daily. (Patient taking differently: Take 135 mg by mouth daily.) 90 capsule 0  . CLICKFINE PEN NEEDLES 32G X 4 MM MISC USE FOUR TIMES DAILY FOR DIABETES 200 each 2  . clopidogrel (PLAVIX) 75 MG tablet Take 75 mg by mouth daily.    . dabigatran (PRADAXA) 150 MG CAPS capsule Take 1 capsule (150 mg total) by mouth every 12 (twelve) hours. 180 capsule 0  . docusate sodium (COLACE) 100 MG capsule Take 300 mg by mouth daily.     [name] Kitchen FREESTYLE LITE test strip 1 EACH BY OTHER ROUTE 4 (FOUR) TIMES DAILY - BEFORE MEALS AND AT  BEDTIME. 450 strip 3  . furosemide (LASIX) 20 MG tablet Take 1 tablet (20 mg total) by mouth every other day. (Patient taking differently: Take 20 mg by mouth daily.)    . glipiZIDE (GLUCOTROL XL) 10 MG 24 hr tablet Take 1 tablet (10 mg total) by mouth daily with breakfast. 90 tablet 1  . insulin degludec (TRESIBA FLEXTOUCH) 200 UNIT/ML FlexTouch Pen Inject 26 Units into the skin at bedtime. (Patient taking differently: Inject 16 Units into the skin at bedtime.) 15 mL 1  . insulin lispro (HUMALOG KWIKPEN) 100 UNIT/ML KwikPen Inject 4-6 units under the skin up to three times daily if eating sweets or high carb foods. (Patient taking differently: Inject 5-8 Units into the skin See admin instructions. Inject 5-8 units under the skin up to three times daily if eating sweets or high carb foods.) 30 mL 1  . levothyroxine (SYNTHROID) 200 MCG tablet TAKE 1 TABLET BY MOUTH DAILY MONDAY-SATURDAY  TAKE 1/2 TABLET ON SUNDAY 30 tablet 5  . metoprolol tartrate (LOPRESSOR) 50 MG tablet TAKE ONE AND ONE-HALF TABLET BY MOUTH TWICE A DAY (Patient taking differently: Take 75 mg by mouth 2 (two) times daily. (BETA BLOCKER)) 270 tablet 0  . mirabegron ER (MYRBETRIQ) 25 MG TB24 tablet Take 1 tablet (25 mg total) by mouth daily. 90 tablet 2  . mupirocin ointment (BACTROBAN) 2 % Apply to great toe wound daily, with gauze and tape (Patient taking differently: Apply 1 application topically See admin instructions. Apply to great toe wound daily, with gauze and tape) 30 g 1  . Semaglutide,0.25 or 0.5MG /DOS, (OZEMPIC, 0.25 OR 0.5 MG/DOSE,) 2 MG/1.5ML SOPN Inject 0.5 mg into the skin once a week. (Patient taking differently: Inject 0.25-0.5 mg into the skin See admin instructions. Take once a week. Take 0.25 mg for three weeks, then take 0.5 mg for four weeks) 1.5 mL 2   No current facility-administered medications for this encounter.    No Known Allergies  Social History   Socioeconomic History  . Marital status: Married     Spouse name: [name]  . Number of children: 1  . Years of education: 55  . Highest education level: Not on file  Occupational History  . Occupation: retired    Fish farm manager: [employer]  Tobacco Use  . Smoking status: Former Smoker    Packs/day: 2.00    Years: 14.00    Pack years: 28.00    Types: Cigarettes    Quit date: 11/11/1972    Years since quitting: 48.1  . Smokeless tobacco: Never Used  Vaping Use  . Vaping Use: Never used  Substance and Sexual Activity  . Alcohol use: No    Comment: quit in 1984  . Drug use: No  . Sexual activity: Not Currently  Other Topics Concern  .  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   Patient has one child and his wife has one child.   Patient is retired.   Patient has a high school education.   Patient is right-handed.   Patient drinks very little caffeine.   Social Determinants of Health   Financial Resource Strain: Not on file  Food Insecurity: No Food Insecurity  . Worried About Charity fundraiser in the Last Year: Never true  . Ran Out of Food in the Last Year: Never true  Transportation Needs: No Transportation Needs  . Lack of Transportation (Medical): No  . Lack of Transportation (Non-Medical): No  Physical Activity: Not on file  Stress: Not on file  Social Connections: Not on file  Intimate Partner Violence: Not on file    Family History  Problem Relation Age of Onset  . Diabetes Mother   . Heart attack Mother   . Hypertension Mother   . Heart attack Father   . Heart failure Father   . Hypertension Father   . Diabetes Father   . Diabetes Sister   . Diabetes Brother   . Diabetes Other   . Diabetes Daughter        TYPE ll  . Heart Problems Daughter   . Hypertension Sister   . Hypertension Brother   . Stroke Brother     ROS- All systems are reviewed and negative except as per the HPI above  Physical Exam: Vitals:   12/06/20 1133  BP: (!)  116/56  Pulse: 65  Weight: 104.3 kg  Height: 5\' 11"  (1.803 m)   Wt Readings from Last 3 Encounters:  12/06/20 104.3 kg  12/05/20 104 kg  11/08/20 105.2 kg    Labs: Lab Results  Component Value Date   NA 138 11/30/2020   K 4.5 11/30/2020   CL 103 11/30/2020   CO2 29 11/30/2020   GLUCOSE 248 (H) 11/30/2020   BUN 30 (H) 11/30/2020   CREATININE 1.85 (H) 11/30/2020   CALCIUM 10.2 11/30/2020   MG 1.9 12/03/2017   Lab Results  Component Value Date   INR 1.06 04/06/2018   Lab Results  Component Value Date   CHOL 209 (H) 11/30/2020   HDL 38.20 (L) 11/30/2020   LDLCALC 145 (H) 11/30/2020   TRIG 133.0 11/30/2020     GEN- The patient is well appearing, alert and oriented x 3 today.   Head- normocephalic, atraumatic Eyes-  Sclera clear, conjunctiva pink Ears- hearing intact Oropharynx- clear Neck- supple, no JVP Lymph- no cervical lymphadenopathy Lungs- Clear to ausculation bilaterally, normal work of breathing Heart- Regular rate and rhythm, no murmurs, rubs or gallops, PMI not laterally displaced GI- soft, NT, ND, + BS Extremities- no clubbing, cyanosis, or edema MS- no significant deformity or atrophy Skin- no rash or lesion Psych- euthymic mood, full affect Neuro- strength and sensation are intact   EKG-  Av dual-paced rhythm    Assessment and Plan: 1. Persistent Afib  S/p afib ablation with Dr Curt Bears 11/08/20 Maintaining  SR Continue amiodarone 200 mg daily Continue Lopressor 75 mg BID  2. CHA2DS2VASc score of at least 6 Continue Pradaxa 150 mg BID  3. CAD No anginal symptoms.  4. CHB S/p PPM, followed by Dr Curt Bears and the device clinic.   Follow up with Dr. Curt Bears  3/28  Geroge Baseman Kharlie Bring, [facility] [address] [place], [place] [address] [phone]

## 2020-12-07 DIAGNOSIS — H40032 Anatomical narrow angle, left eye: Secondary | ICD-10-CM | POA: Diagnosis not present

## 2020-12-07 DIAGNOSIS — H40013 Open angle with borderline findings, low risk, bilateral: Secondary | ICD-10-CM | POA: Diagnosis not present

## 2020-12-07 DIAGNOSIS — H02831 Dermatochalasis of right upper eyelid: Secondary | ICD-10-CM | POA: Diagnosis not present

## 2020-12-07 LAB — HM DIABETES EYE EXAM

## 2020-12-08 ENCOUNTER — Other Ambulatory Visit: Payer: Self-pay | Admitting: Endocrinology

## 2020-12-08 MED ORDER — ATORVASTATIN CALCIUM 40 MG PO TABS: 40.0000 mg | ORAL_TABLET | Freq: Every day | ORAL | 3 refills | Status: DC

## 2020-12-11 ENCOUNTER — Other Ambulatory Visit: Payer: Self-pay | Admitting: Physician Assistant

## 2020-12-20 ENCOUNTER — Other Ambulatory Visit: Payer: Self-pay | Admitting: *Deleted

## 2020-12-20 NOTE — Patient Instructions (Signed)
Goals Addressed            This Visit's Progress   . (THN)Make and Keep All Appointments   On track    Timeframe:  Long-Range Goal Priority:  Medium Start Date:    89381017                         Expected End Date:      51025852                Follow Up Date [id number]   - call to cancel if needed - keep a calendar with prescription refill dates - keep a calendar with appointment dates    Why is this important?   Part of staying healthy is seeing the doctor for follow-up care.  If you forget your appointments, there are some things you can do to stay on track.    Notes:     . (THN)Monitor and Manage My Blood Sugar   On track    Timeframe:  Long-Range Goal Priority:  High Start Date:   36144315                          Expected End Date:    40086761                  Follow Up Date 95093267   - check blood sugar at prescribed times - check blood sugar if I feel it is too high or too low - enter blood sugar readings and medication or insulin into daily log - take the blood sugar log to all doctor visits - take the blood sugar meter to all doctor visits    Why is this important?   Checking your blood sugar at home helps to keep it from getting very high or very low.  Writing the results in a diary or log helps the doctor know how to care for you.  Your blood sugar log should have the time, date and the results.  Also, write down the amount of insulin or other medicine that you take.  Other information, like what you ate, exercise done and how you were feeling, will also be helpful.     Notes:     . [facility] Eye Exam   On track    Timeframe:  Long-Range Goal Priority:  Medium Start Date:        12458099                     Expected End Date:   83382505                   Follow Up Date [id number]   - keep appointment with eye doctor - schedule appointment with eye doctor    Why is this important?   Eye check-ups are important when you have diabetes.  Vision loss can  be prevented.    Notes:  Eye exam [id number]    . (THN)Perform Foot Care   On track    Timeframe:  Long-Range Goal Priority:  Medium Start Date:   97353299                          Expected End Date:   24268341                   Follow Up Date 96222979  - check  feet daily for cuts, sores or redness - keep feet up while sitting - trim toenails straight across - wash and dry feet carefully every day - wear comfortable, cotton socks - wear comfortable, well-fitting shoes    Why is this important?   Good foot care is very important when you have diabetes.  There are many things you can do to keep your feet healthy and catch a problem early.    Notes:  Last Podiatry visit was [id number]    . (THN)Set My Target A1C   On track    Timeframe:  Long-Range Goal Priority:  High Start Date:      88325498                       Expected End Date:     26415830                 Follow Up Date 94076808   - set target A1C    Why is this important?   Your target A1C is decided together by you and your doctor.  It is based on several things like your age and other health issues.    Notes:  A1C target 7.0 [id number] A1C 8.7 down from 8.8

## 2020-12-22 ENCOUNTER — Telehealth: Payer: Self-pay | Admitting: Physician Assistant

## 2020-12-22 NOTE — Telephone Encounter (Signed)
patient came in asking if we could call in a Rx for his toe cream mupirocin ointment (BACTROBAN) 2 %. advised that Dr. Sherryle Lis, the prescribing physician will have to send in Rx. thank you

## 2020-12-25 ENCOUNTER — Ambulatory Visit (INDEPENDENT_AMBULATORY_CARE_PROVIDER_SITE_OTHER): Payer: Medicare Other

## 2020-12-25 DIAGNOSIS — I442 Atrioventricular block, complete: Secondary | ICD-10-CM | POA: Diagnosis not present

## 2020-12-26 ENCOUNTER — Ambulatory Visit (INDEPENDENT_AMBULATORY_CARE_PROVIDER_SITE_OTHER): Payer: Medicare Other | Admitting: Cardiovascular Disease

## 2020-12-26 ENCOUNTER — Encounter: Payer: Self-pay | Admitting: Cardiovascular Disease

## 2020-12-26 ENCOUNTER — Other Ambulatory Visit: Payer: Self-pay

## 2020-12-26 VITALS — BP 145/92 | HR 95 | Ht 71.0 in | Wt 235.4 lb

## 2020-12-26 DIAGNOSIS — I739 Peripheral vascular disease, unspecified: Secondary | ICD-10-CM

## 2020-12-26 DIAGNOSIS — I480 Paroxysmal atrial fibrillation: Secondary | ICD-10-CM | POA: Diagnosis not present

## 2020-12-26 DIAGNOSIS — I2510 Atherosclerotic heart disease of native coronary artery without angina pectoris: Secondary | ICD-10-CM | POA: Diagnosis not present

## 2020-12-26 DIAGNOSIS — E785 Hyperlipidemia, unspecified: Secondary | ICD-10-CM

## 2020-12-26 LAB — CUP PACEART REMOTE DEVICE CHECK
Battery Remaining Longevity: 97 mo
Battery Voltage: 2.92 V
Brady Statistic AP VP Percent: 36.82 %
Brady Statistic AP VS Percent: 0.02 %
Brady Statistic AS VP Percent: 62.77 %
Brady Statistic AS VS Percent: 0.39 %
Brady Statistic RA Percent Paced: 37 %
Brady Statistic RV Percent Paced: 99.59 %
Date Time Interrogation Session: 20220215022415
Implantable Lead Implant Date: 20190124
Implantable Lead Implant Date: 20190124
Implantable Lead Location: 753859
Implantable Lead Location: 753860
Implantable Lead Model: 5076
Implantable Lead Model: 5076
Implantable Pulse Generator Implant Date: 20190124
Lead Channel Impedance Value: 323 Ohm
Lead Channel Impedance Value: 361 Ohm
Lead Channel Impedance Value: 380 Ohm
Lead Channel Impedance Value: 475 Ohm
Lead Channel Pacing Threshold Amplitude: 0.625 V
Lead Channel Pacing Threshold Amplitude: 0.75 V
Lead Channel Pacing Threshold Pulse Width: 0.4 ms
Lead Channel Pacing Threshold Pulse Width: 0.4 ms
Lead Channel Sensing Intrinsic Amplitude: 17.625 mV
Lead Channel Sensing Intrinsic Amplitude: 2.25 mV
Lead Channel Sensing Intrinsic Amplitude: 2.25 mV
Lead Channel Sensing Intrinsic Amplitude: 6.5 mV
Lead Channel Setting Pacing Amplitude: 2 V
Lead Channel Setting Pacing Amplitude: 2.5 V
Lead Channel Setting Pacing Pulse Width: 0.4 ms
Lead Channel Setting Sensing Sensitivity: 4 mV

## 2020-12-26 NOTE — Patient Instructions (Signed)
Medication Instructions:  STOP the Clopidogrel (Plavix)  *If you need a refill on your cardiac medications before your next appointment, please call your pharmacy*   Lab Work: None ordered If you have labs (blood work) drawn today and your tests are completely normal, you will receive your results only by: [name] Kitchen MyChart Message (if you have MyChart) OR . A paper copy in the mail If you have any lab test that is abnormal or we need to change your treatment, we will call you to review the results.   Testing/Procedures: Your physician has requested that you have a lower extremity arterial duplex. During this test, ultrasound is used to evaluate arterial blood flow in the legs. Allow one hour for this exam. There are no restrictions or special instructions. This will take place at [place], [address].  Your physician has requested that you have an ankle brachial index (ABI). During this test an ultrasound and blood pressure cuff are used to evaluate the arteries that supply the arms and legs with blood. Allow thirty minutes for this exam. There are no restrictions or special instructions. This will take place at [place], [address].   Follow-Up: At [facility], you and your health needs are our priority.  As part of our continuing mission to provide you with exceptional heart care, we have created designated Provider Care Teams.  These Care Teams include your primary Cardiologist (physician) and Advanced Practice Providers (APPs -  Physician Assistants and Nurse Practitioners) who all work together to provide you with the care you need, when you need it.  We recommend signing up for the patient portal called "MyChart".  Sign up information is provided on this After Visit Summary.  MyChart is used to connect with patients for Virtual Visits (Telemedicine).  Patients are able to view lab/test results, encounter notes, upcoming appointments, etc.  Non-urgent messages can be sent to  your provider as well.   To learn more about what you can do with MyChart, go to [email or web address].    Your next appointment:   6 month(s)  The format for your next appointment:   In Person  Provider:   [name], MD

## 2020-12-26 NOTE — Progress Notes (Signed)
Cardiology Office Note   Date:  12/26/2020   ID:  Fintan, Grater 1941-07-03, MRN [id number]  PCP:  Lorrene Reid, [name]  Cardiologist:  Dr. Angelena Form  No chief complaint on file.     History of Present Illness: [place] CURRENT is a 80 y.o. male who presents for  a followup visit regarding  PAD . 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 He is followed by me for peripheral arterial disease.  I performed successful angioplasty and drug-coated balloon angioplasty to the right popliteal artery in 2015.  He was subsequently seen by Dr. 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 He was seen last year for small ulceration  on the right great toe .  He had Doppler studies done which showed an occluded heavily calcified distal SFA/popliteal artery. Angiography in October of last year showed no significant aortoiliac disease.  On the right side, there was severe heavily calcified stenosis in the distal SFA with heavily calcified occluded popliteal artery above the knee with severe stenosis affecting the distal anterior tibial artery, occluded distal posterior tibial artery and occluded distal peroneal artery.  I performed successful orbital atherectomy and drug-coated balloon angioplasty to the right distal SFA and popliteal arteries with excellent results.   The ulceration healed and he reports no claudication at the present time.  Past Medical History:  Diagnosis Date  . Chronic diastolic CHF (congestive heart failure) ([place])   . CKD (chronic kidney disease), stage III ([place])   . Constipation   . Coronary artery disease    a. Cath February 2012 in [facility], occluded RCA with collaterals  . DM type 2 (diabetes mellitus, type 2) ([place])   . Essential  hypertension   . Hyperlipidemia   . Neuropathy    feet  . Pacemaker    a. symptomatic brady after TAVR s/p MDT PPM by Dr. Curt Bears 12/04/17  . Persistent atrial fibrillation ([place])   . PONV (postoperative nausea and vomiting)    after valve surgery  . PVD (peripheral vascular disease) ([place])    a. s/p R popliteal artery stenosis tx with drug-coated balloon 05/2014, followed by Dr. Fletcher Anon.  . 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 Severe aortic stenosis    a. 12/02/17: s/p TAVR  . Skin cancer   . Sleep apnea with use of continuous positive airway pressure (CPAP)    04-11-11 AHI was 32.9 and titrated to 15 cm H20, DME is [facility]  . Subclinical hypothyroidism     Past Surgical History:  Procedure Laterality Date  . ABDOMINAL ANGIOGRAM N/A 06/08/2014   Procedure: ABDOMINAL ANGIOGRAM;  Surgeon: Wellington Hampshire, MD;  Location: [facility] CATH LAB;  Service: Cardiovascular;  Laterality: N/A;  . ABDOMINAL AORTOGRAM N/A 04/09/2018   Procedure: ABDOMINAL AORTOGRAM;  Surgeon: Conrad Advance, MD;  Location: [place] CV LAB;  Service: Cardiovascular;  Laterality: N/A;  . ABDOMINAL AORTOGRAM W/LOWER EXTREMITY N/A 08/23/2020   Procedure: ABDOMINAL AORTOGRAM W/LOWER EXTREMITY;  Surgeon: Wellington Hampshire, MD;  Location: [place] CV LAB;  Service: Cardiovascular;  Laterality: N/A;  . AMPUTATION Left 04/17/2018   Procedure: LEFT FOOT 3RD RAY AMPUTATION;  Surgeon: Sharol Given,  Illene Regulus, MD;  Location: [place];  Service: Orthopedics;  Laterality: Left;  . AMPUTATION Left 05/28/2018   Procedure: LEFT AMPUTATION BELOW KNEE;  Surgeon: Wylene Simmer, MD;  Location: [place];  Service: Orthopedics;  Laterality: Left;  . APPENDECTOMY  1965  . ATRIAL FIBRILLATION ABLATION N/A 11/08/2020   Procedure: ATRIAL FIBRILLATION ABLATION;  Surgeon: Constance Haw, MD;  Location: [place] CV LAB;  Service:  Cardiovascular;  Laterality: N/A;  . 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 CARDIAC CATHETERIZATION  12/2010  . CARDIOVERSION  07/2011  . CARDIOVERSION N/A 04/18/2014   Procedure: CARDIOVERSION;  Surgeon: Dorothy Spark, MD;  Location: [place];  Service: Cardiovascular;  Laterality: N/A;  . CARDIOVERSION N/A 11/03/2015   Procedure: CARDIOVERSION;  Surgeon: Lelon Perla, MD;  Location: [facility] ENDOSCOPY;  Service: Cardiovascular;  Laterality: N/A;  . CARDIOVERSION N/A 05/08/2017   Procedure: CARDIOVERSION;  Surgeon: Dorothy Spark, MD;  Location: [facility] ENDOSCOPY;  Service: Cardiovascular;  Laterality: N/A;  . CARDIOVERSION N/A 07/28/2017   Procedure: CARDIOVERSION;  Surgeon: Dorothy Spark, MD;  Location: [facility] ENDOSCOPY;  Service: Cardiovascular;  Laterality: N/A;  . CARDIOVERSION N/A 02/08/2020   Procedure: CARDIOVERSION;  Surgeon: Buford Dresser, MD;  Location: [facility] ENDOSCOPY;  Service: Cardiovascular;  Laterality: N/A;  . CARDIOVERSION N/A 03/15/2020   Procedure: CARDIOVERSION;  Surgeon: Acie Fredrickson Wonda Cheng, MD;  Location: [facility] ENDOSCOPY;  Service: Cardiovascular;  Laterality: N/A;  . CARDIOVERSION N/A 09/20/2020   Procedure: CARDIOVERSION;  Surgeon: Werner Lean, MD;  Location: [facility] ENDOSCOPY;  Service: Cardiovascular;  Laterality: N/A;  . LOWER EXTREMITY ANGIOGRAM N/A 06/08/2014   Procedure: LOWER EXTREMITY ANGIOGRAM;  Surgeon: Wellington Hampshire, MD;  Location: [facility] CATH LAB;  Service: Cardiovascular;  Laterality: N/A;  . LOWER EXTREMITY ANGIOGRAPHY Left 04/09/2018   Procedure: Lower Extremity Angiography;  Surgeon: Conrad Wolfforth, MD;  Location: [place] CV LAB;  Service: Cardiovascular;  Laterality: Left;  . PACEMAKER IMPLANT N/A 12/04/2017   Procedure: PACEMAKER IMPLANT;  Surgeon: Constance Haw, MD;  Location: [place] CV LAB;  Service: Cardiovascular;  Laterality: N/A;  . PERIPHERAL VASCULAR ATHERECTOMY  08/23/2020   Procedure: PERIPHERAL VASCULAR ATHERECTOMY;   Surgeon: Wellington Hampshire, MD;  Location: [place] CV LAB;  Service: Cardiovascular;;  . PERIPHERAL VASCULAR BALLOON ANGIOPLASTY Left 04/09/2018   Procedure: PERIPHERAL VASCULAR BALLOON ANGIOPLASTY;  Surgeon: Conrad Parmelee, MD;  Location: [place] CV LAB;  Service: Cardiovascular;  Laterality: Left;  SFA  . POPLITEAL ARTERY ANGIOPLASTY Right 06/08/2014   Archie Endo 06/08/2014  . RIGHT/LEFT HEART CATH AND CORONARY ANGIOGRAPHY N/A 10/08/2017   Procedure: RIGHT/LEFT HEART CATH AND CORONARY ANGIOGRAPHY;  Surgeon: Burnell Blanks, MD;  Location: [place] CV LAB;  Service: Cardiovascular;  Laterality: N/A;  . SKIN CANCER EXCISION Bilateral    "have had them cut off back of neck X 2; off left upper arm; right wrist, near right shoulder blade" (06/08/2014)  . TEE WITHOUT CARDIOVERSION N/A 12/02/2017   Procedure: TRANSESOPHAGEAL ECHOCARDIOGRAM (TEE);  Surgeon: Burnell Blanks, MD;  Location: [place];  Service: Open Heart Surgery;  Laterality: N/A;  . TEMPORARY PACEMAKER N/A 12/04/2017   Procedure: TEMPORARY PACEMAKER;  Surgeon: Leonie Man, MD;  Location: [place] CV LAB;  Service: Cardiovascular;  Laterality: N/A;  . TRANSCATHETER AORTIC VALVE REPLACEMENT, TRANSFEMORAL N/A 12/02/2017   Procedure: TRANSCATHETER AORTIC VALVE REPLACEMENT, TRANSFEMORAL;  Surgeon: Burnell Blanks, MD;  Location: [place];  Service: Open Heart Surgery;  Laterality: N/A;  using Oletta Lamas  Sapien 3 Transcatheter Heart Valve size 23mm     Current Outpatient Medications  Medication Sig Dispense Refill  . amiodarone (PACERONE) 200 MG tablet Take 200 mg by mouth daily.    [name] Kitchen atorvastatin (LIPITOR) 40 MG tablet Take 1 tablet (40 mg total) by mouth daily. 90 tablet 3  . Blood Glucose Monitoring Suppl (FREESTYLE LITE) DEVI 1 each by Does not apply route 2 (two) times daily. 1 each 0  . Choline Fenofibrate (FENOFIBRIC ACID) 135 MG CPDR TAKE 1 CAPSULE BY MOUTH EVERY DAY 90 capsule 0  . CLICKFINE PEN NEEDLES 32G  X 4 MM MISC USE FOUR TIMES DAILY FOR DIABETES 200 each 2  . dabigatran (PRADAXA) 150 MG CAPS capsule Take 1 capsule (150 mg total) by mouth every 12 (twelve) hours. 180 capsule 0  . docusate sodium (COLACE) 100 MG capsule Take 300 mg by mouth daily.     [name] Kitchen FREESTYLE LITE test strip 1 EACH BY OTHER ROUTE 4 (FOUR) TIMES DAILY - BEFORE MEALS AND AT BEDTIME. 450 strip 3  . furosemide (LASIX) 20 MG tablet Take 1 tablet (20 mg total) by mouth every other day. (Patient taking differently: Take 20 mg by mouth daily.)    . glipiZIDE (GLUCOTROL XL) 10 MG 24 hr tablet Take 1 tablet (10 mg total) by mouth daily with breakfast. 90 tablet 1  . insulin degludec (TRESIBA FLEXTOUCH) 200 UNIT/ML FlexTouch Pen Inject 26 Units into the skin at bedtime. (Patient taking differently: Inject 16 Units into the skin at bedtime.) 15 mL 1  . insulin lispro (HUMALOG KWIKPEN) 100 UNIT/ML KwikPen Inject 5-8 Units into the skin See admin instructions. Inject 5-8 units under the skin up to three times daily if eating sweets or high carb foods. 30 mL 3  . levothyroxine (SYNTHROID) 200 MCG tablet TAKE 1 TABLET BY MOUTH DAILY MONDAY-SATURDAY  TAKE 1/2 TABLET ON SUNDAY 30 tablet 5  . metoprolol tartrate (LOPRESSOR) 50 MG tablet TAKE ONE AND ONE-HALF TABLET BY MOUTH TWICE A DAY (Patient taking differently: Take 75 mg by mouth 2 (two) times daily. (BETA BLOCKER)) 270 tablet 0  . mirabegron ER (MYRBETRIQ) 25 MG TB24 tablet Take 1 tablet (25 mg total) by mouth daily. 90 tablet 2  . mupirocin ointment (BACTROBAN) 2 % Apply to great toe wound daily, with gauze and tape (Patient taking differently: Apply 1 application topically See admin instructions. Apply to great toe wound daily, with gauze and tape) 30 g 1  . Semaglutide,0.25 or 0.5MG /DOS, (OZEMPIC, 0.25 OR 0.5 MG/DOSE,) 2 MG/1.5ML SOPN Inject 0.5 mg into the skin once a week. (Patient taking differently: Inject 0.25-0.5 mg into the skin See admin instructions. Take once a week. Take 0.25 mg  for three weeks, then take 0.5 mg for four weeks) 1.5 mL 2   No current facility-administered medications for this visit.    Allergies:   Patient has no known allergies.    Social History:  The patient  reports that he quit smoking about 48 years ago. His smoking use included cigarettes. He has a 28.00 pack-year smoking history. He has never used smokeless tobacco. He reports that he does not drink alcohol and does not use drugs.   Family History:  The patient's family history includes Diabetes in his brother, daughter, father, mother, sister, and another family member; Heart Problems in his daughter; Heart attack in his father and mother; Heart failure in his father; Hypertension in his brother, father, mother, and sister; Stroke in his brother.  ROS:  Please see the history of present illness.   Otherwise, review of systems are positive for none.   All other systems are reviewed and negative.    PHYSICAL EXAM: VS:  BP (!) 145/92   Pulse 95   Ht 5\' 11"  (1.803 m)   Wt 235 lb 6.4 oz (106.8 kg)   SpO2 98%   BMI 32.83 kg/m  , BMI Body mass index is 32.83 kg/m. GEN: Well nourished, well developed, in no acute distress  HEENT: normal  Neck: no JVD, carotid bruits, or masses Cardiac: Irregularly irregular; no  rubs, or gallops,no edema . There is a 3/6 mid-late peaking aortic stenosis murmur Respiratory:  clear to auscultation bilaterally, normal work of breathing GI: soft, nontender, nondistended, + BS MS: no deformity or atrophy  Skin: warm and dry, no rash Neuro:  Strength and sensation are intact Psych: euthymic mood, full affect Vascular: Femoral pulses are normal.  There is a faint dorsalis pedis pulse on the right side.  EKG:  EKG is not ordered today.    Recent Labs: 10/16/2020: Hemoglobin 15.1; Platelets 168 11/30/2020: ALT 21; BUN 30; Creatinine, Ser 1.85; Potassium 4.5; Sodium 138; TSH 2.49    Lipid Panel    Component Value Date/Time   CHOL 209 (H) 11/30/2020  1357   CHOL 161 01/24/2020 1045   TRIG 133.0 11/30/2020 1357   HDL 38.20 (L) 11/30/2020 1357   HDL 34 (L) 01/24/2020 1045   CHOLHDL 5 11/30/2020 1357   VLDL 26.6 11/30/2020 1357   LDLCALC 145 (H) 11/30/2020 1357   LDLCALC 106 (H) 01/24/2020 1045   LDLCALC 142 (H) 05/31/2019 1246   LDLDIRECT 123.0 12/03/2019 1136      Wt Readings from Last 3 Encounters:  12/26/20 235 lb 6.4 oz (106.8 kg)  12/06/20 230 lb (104.3 kg)  12/05/20 229 lb 3.2 oz (104 kg)        ASSESSMENT AND PLAN:  1.  Peripheral arterial disease: Status post left BKA. Status post orbital atherectomy and drug-coated balloon angioplasty to the right SFA and popliteal arteries for critical limb ischemia with subsequent resolution of ulceration. I elected to discontinue Plavix today. The patient is on anticoagulation with Pradaxa. Repeat Doppler studies in April. The patient does have significant tibial disease but currently does not seem to be symptomatic. Continue to monitor.  2. Atrial fibrillation: On long-term anticoagulation with Pradaxa.  3. Hyperlipidemia: Continue treatment with high dose atorvastatin with a target LDL of less than 70.  4. Coronary artery disease involving native coronary arteries without angina or medical therapy.  5.  Status post TAVR: Seems to be stable.   Disposition:   FU with me in 6 months.  Signed,  [name], MD  12/26/2020 1:21 PM    [facility]

## 2020-12-27 ENCOUNTER — Telehealth: Payer: Self-pay | Admitting: Endocrinology

## 2020-12-27 NOTE — Telephone Encounter (Signed)
[name] called to check on the status of the [facility] paperwork. [name] [name] states the [facility] faxed over paperwork twice to our office and that we need to fill out the renewal in order for him to receive the [employer].

## 2020-12-28 ENCOUNTER — Ambulatory Visit (INDEPENDENT_AMBULATORY_CARE_PROVIDER_SITE_OTHER): Payer: Medicare Other | Admitting: Physician Assistant

## 2020-12-28 ENCOUNTER — Encounter: Payer: Self-pay | Admitting: Physician Assistant

## 2020-12-28 ENCOUNTER — Other Ambulatory Visit: Payer: Self-pay

## 2020-12-28 VITALS — BP 135/66 | HR 91 | Temp 97.4°F | Ht 71.0 in | Wt 237.5 lb

## 2020-12-28 DIAGNOSIS — E1169 Type 2 diabetes mellitus with other specified complication: Secondary | ICD-10-CM

## 2020-12-28 DIAGNOSIS — E782 Mixed hyperlipidemia: Secondary | ICD-10-CM

## 2020-12-28 DIAGNOSIS — L03031 Cellulitis of right toe: Secondary | ICD-10-CM

## 2020-12-28 DIAGNOSIS — I739 Peripheral vascular disease, unspecified: Secondary | ICD-10-CM | POA: Diagnosis not present

## 2020-12-28 DIAGNOSIS — E039 Hypothyroidism, unspecified: Secondary | ICD-10-CM

## 2020-12-28 DIAGNOSIS — E11649 Type 2 diabetes mellitus with hypoglycemia without coma: Secondary | ICD-10-CM

## 2020-12-28 MED ORDER — MUPIROCIN 2 % EX OINT: TOPICAL_OINTMENT | CUTANEOUS | 0 refills | Status: DC

## 2020-12-28 NOTE — Progress Notes (Signed)
Established Patient Office Visit  Subjective:  Patient ID: Steven Maldonado, male    DOB: 03/15/1941  Age: 80 y.o. MRN: [id number]  CC:  Chief Complaint  Patient presents with  . Diabetes  . Hyperlipidemia  . Hypothyroidism    HPI [name] presents for follow up on diabetes mellitus, hyperlipidemia and hypothyroid. Is requesting a refill of mupirocin ointment which was prescribed by his podiatrist but does not see him until he is cleared by cardiology.   Diabetes: Followed by Endocrinology. Pt denies increased urination or thirst. Pt reports medication compliance. Reports a few hypoglycemic events. Checking glucose at home which fluctuate. Has cut back on snacks, sweets and late night eating. Is working on improving his diabetes.   HLD: Pt taking medication as directed without issues. States has reduced fried foods.   Hypothyroid: Followed by endocrinology. Reports medication compliance.   Past Medical History:  Diagnosis Date  . Chronic diastolic CHF (congestive heart failure) ([place])   . CKD (chronic kidney disease), stage III ([place])   . Constipation   . Coronary artery disease    a. Cath February 2012 in [facility], occluded RCA with collaterals  . DM type 2 (diabetes mellitus, type 2) ([place])   . Essential hypertension   . Hyperlipidemia   . Neuropathy    feet  . Pacemaker    a. symptomatic brady after TAVR s/p MDT PPM by Dr. Curt Bears 12/04/17  . Persistent atrial fibrillation ([place])   . PONV (postoperative nausea and vomiting)    after valve surgery  . PVD (peripheral vascular disease) ([place])    a. s/p R popliteal artery stenosis tx with drug-coated balloon 05/2014, followed by Dr. Fletcher Anon.  . 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 Severe aortic stenosis    a. 12/02/17: s/p TAVR  . Skin cancer   . Sleep apnea with use of continuous positive airway  pressure (CPAP)    04-11-11 AHI was 32.9 and titrated to 15 cm H20, DME is [facility]  . Subclinical hypothyroidism     Past Surgical History:  Procedure Laterality Date  . ABDOMINAL ANGIOGRAM N/A 06/08/2014   Procedure: ABDOMINAL ANGIOGRAM;  Surgeon: Wellington Hampshire, MD;  Location: [facility] CATH LAB;  Service: Cardiovascular;  Laterality: N/A;  . ABDOMINAL AORTOGRAM N/A 04/09/2018   Procedure: ABDOMINAL AORTOGRAM;  Surgeon: Conrad Lackawanna, MD;  Location: [place] CV LAB;  Service: Cardiovascular;  Laterality: N/A;  . ABDOMINAL AORTOGRAM W/LOWER EXTREMITY N/A 08/23/2020   Procedure: ABDOMINAL AORTOGRAM W/LOWER EXTREMITY;  Surgeon: Wellington Hampshire, MD;  Location: [place] CV LAB;  Service: Cardiovascular;  Laterality: N/A;  . AMPUTATION Left 04/17/2018   Procedure: LEFT FOOT 3RD RAY AMPUTATION;  Surgeon: Newt Minion, MD;  Location: [place];  Service: Orthopedics;  Laterality: Left;  . AMPUTATION Left 05/28/2018   Procedure: LEFT AMPUTATION BELOW KNEE;  Surgeon: Wylene Simmer, MD;  Location: [place];  Service: Orthopedics;  Laterality: Left;  . APPENDECTOMY  1965  . ATRIAL FIBRILLATION ABLATION N/A 11/08/2020   Procedure: ATRIAL FIBRILLATION ABLATION;  Surgeon: Constance Haw, MD;  Location: [place] CV LAB;  Service: Cardiovascular;  Laterality: N/A;  . 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 CARDIAC CATHETERIZATION  12/2010  . CARDIOVERSION  07/2011  . CARDIOVERSION N/A 04/18/2014   Procedure: CARDIOVERSION;  Surgeon: Dorothy Spark, MD;  Location: [place];  Service: Cardiovascular;  Laterality: N/A;  . CARDIOVERSION N/A 11/03/2015   Procedure: CARDIOVERSION;  Surgeon: Lelon Perla, MD;  Location: [facility] ENDOSCOPY;  Service: Cardiovascular;  Laterality: N/A;  . CARDIOVERSION N/A 05/08/2017   Procedure: CARDIOVERSION;  Surgeon: Dorothy Spark, MD;  Location: [facility] ENDOSCOPY;  Service: Cardiovascular;  Laterality: N/A;  . CARDIOVERSION N/A 07/28/2017   Procedure: CARDIOVERSION;   Surgeon: Dorothy Spark, MD;  Location: [facility] ENDOSCOPY;  Service: Cardiovascular;  Laterality: N/A;  . CARDIOVERSION N/A 02/08/2020   Procedure: CARDIOVERSION;  Surgeon: Buford Dresser, MD;  Location: [facility] ENDOSCOPY;  Service: Cardiovascular;  Laterality: N/A;  . CARDIOVERSION N/A 03/15/2020   Procedure: CARDIOVERSION;  Surgeon: Thayer Headings, MD;  Location: [facility] ENDOSCOPY;  Service: Cardiovascular;  Laterality: N/A;  . CARDIOVERSION N/A 09/20/2020   Procedure: CARDIOVERSION;  Surgeon: Werner Lean, MD;  Location: [facility] ENDOSCOPY;  Service: Cardiovascular;  Laterality: N/A;  . LOWER EXTREMITY ANGIOGRAM N/A 06/08/2014   Procedure: LOWER EXTREMITY ANGIOGRAM;  Surgeon: Wellington Hampshire, MD;  Location: [facility] CATH LAB;  Service: Cardiovascular;  Laterality: N/A;  . LOWER EXTREMITY ANGIOGRAPHY Left 04/09/2018   Procedure: Lower Extremity Angiography;  Surgeon: Conrad Media, MD;  Location: [place] CV LAB;  Service: Cardiovascular;  Laterality: Left;  . PACEMAKER IMPLANT N/A 12/04/2017   Procedure: PACEMAKER IMPLANT;  Surgeon: Constance Haw, MD;  Location: [place] CV LAB;  Service: Cardiovascular;  Laterality: N/A;  . PERIPHERAL VASCULAR ATHERECTOMY  08/23/2020   Procedure: PERIPHERAL VASCULAR ATHERECTOMY;  Surgeon: Wellington Hampshire, MD;  Location: [place] CV LAB;  Service: Cardiovascular;;  . PERIPHERAL VASCULAR BALLOON ANGIOPLASTY Left 04/09/2018   Procedure: PERIPHERAL VASCULAR BALLOON ANGIOPLASTY;  Surgeon: Conrad [place], MD;  Location: [place] CV LAB;  Service: Cardiovascular;  Laterality: Left;  SFA  . POPLITEAL ARTERY ANGIOPLASTY Right 06/08/2014   Archie Endo 06/08/2014  . RIGHT/LEFT HEART CATH AND CORONARY ANGIOGRAPHY N/A 10/08/2017   Procedure: RIGHT/LEFT HEART CATH AND CORONARY ANGIOGRAPHY;  Surgeon: Burnell Blanks, MD;  Location: [place] CV LAB;  Service: Cardiovascular;  Laterality: N/A;  . SKIN CANCER EXCISION Bilateral    "have had them cut off back  of neck X 2; off left upper arm; right wrist, near right shoulder blade" (06/08/2014)  . TEE WITHOUT CARDIOVERSION N/A 12/02/2017   Procedure: TRANSESOPHAGEAL ECHOCARDIOGRAM (TEE);  Surgeon: Burnell Blanks, MD;  Location: [place];  Service: Open Heart Surgery;  Laterality: N/A;  . TEMPORARY PACEMAKER N/A 12/04/2017   Procedure: TEMPORARY PACEMAKER;  Surgeon: Leonie Man, MD;  Location: [place] CV LAB;  Service: Cardiovascular;  Laterality: N/A;  . TRANSCATHETER AORTIC VALVE REPLACEMENT, TRANSFEMORAL N/A 12/02/2017   Procedure: TRANSCATHETER AORTIC VALVE REPLACEMENT, TRANSFEMORAL;  Surgeon: Burnell Blanks, MD;  Location: [place];  Service: Open Heart Surgery;  Laterality: N/A;  using Edwards Sapien 3 Transcatheter Heart Valve size 14mm    Family History  Problem Relation Age of Onset  . Diabetes Mother   . Heart attack Mother   . Hypertension Mother   . Heart attack Father   . Heart failure Father   . Hypertension Father   . Diabetes Father   . Diabetes Sister   . Diabetes Brother   . Diabetes Other   . Diabetes Daughter        TYPE ll  . Heart Problems Daughter   . Hypertension Sister   . Hypertension Brother   . Stroke Brother     Social History   Socioeconomic History  .  Marital status: Married    Spouse name: [name]  . Number of children: 1  . Years of education: 63  . Highest education level: Not on file  Occupational History  . Occupation: retired    Fish farm manager: [employer]  Tobacco Use  . Smoking status: Former Smoker    Packs/day: 2.00    Years: 14.00    Pack years: 28.00    Types: Cigarettes    Quit date: 11/11/1972    Years since quitting: 48.1  . Smokeless tobacco: Never Used  Vaping Use  . Vaping Use: Never used  Substance and Sexual Activity  . Alcohol use: No    Comment: quit in 1984  . Drug use: No  . Sexual activity: Not Currently  Other Topics Concern  . Not on file  Social History Narrative   Patient is married  Engineer, drilling) and lives at home with his wife.   Patient has one child and his wife has one child.   Patient is retired.   Patient has a high school education.   Patient is right-handed.   Patient drinks very little caffeine.   Social Determinants of Health   Financial Resource Strain: Not on file  Food Insecurity: No Food Insecurity  . Worried About Charity fundraiser in the Last Year: Never true  . Ran Out of Food in the Last Year: Never true  Transportation Needs: No Transportation Needs  . Lack of Transportation (Medical): No  . Lack of Transportation (Non-Medical): No  Physical Activity: Not on file  Stress: Not on file  Social Connections: Not on file  Intimate Partner Violence: Not on file    Outpatient Medications Prior to Visit  Medication Sig Dispense Refill  . amiodarone (PACERONE) 200 MG tablet Take 200 mg by mouth daily.    [name] Kitchen atorvastatin (LIPITOR) 40 MG tablet Take 1 tablet (40 mg total) by mouth daily. 90 tablet 3  . Blood Glucose Monitoring Suppl (FREESTYLE LITE) DEVI 1 each by Does not apply route 2 (two) times daily. 1 each 0  . Choline Fenofibrate (FENOFIBRIC ACID) 135 MG CPDR TAKE 1 CAPSULE BY MOUTH EVERY DAY 90 capsule 0  . CLICKFINE PEN NEEDLES 32G X 4 MM MISC USE FOUR TIMES DAILY FOR DIABETES 200 each 2  . dabigatran (PRADAXA) 150 MG CAPS capsule Take 1 capsule (150 mg total) by mouth every 12 (twelve) hours. 180 capsule 0  . docusate sodium (COLACE) 100 MG capsule Take 300 mg by mouth daily.     [name] Kitchen FREESTYLE LITE test strip 1 EACH BY OTHER ROUTE 4 (FOUR) TIMES DAILY - BEFORE MEALS AND AT BEDTIME. 450 strip 3  . furosemide (LASIX) 20 MG tablet Take 1 tablet (20 mg total) by mouth every other day. (Patient taking differently: Take 20 mg by mouth daily.)    . glipiZIDE (GLUCOTROL XL) 10 MG 24 hr tablet Take 1 tablet (10 mg total) by mouth daily with breakfast. 90 tablet 1  . insulin degludec (TRESIBA FLEXTOUCH) 200 UNIT/ML FlexTouch Pen Inject 26 Units into the skin  at bedtime. (Patient taking differently: Inject 16 Units into the skin at bedtime.) 15 mL 1  . insulin lispro (HUMALOG KWIKPEN) 100 UNIT/ML KwikPen Inject 5-8 Units into the skin See admin instructions. Inject 5-8 units under the skin up to three times daily if eating sweets or high carb foods. 30 mL 3  . levothyroxine (SYNTHROID) 200 MCG tablet TAKE 1 TABLET BY MOUTH DAILY MONDAY-SATURDAY  TAKE 1/2 TABLET ON SUNDAY 30  tablet 5  . metoprolol tartrate (LOPRESSOR) 50 MG tablet TAKE ONE AND ONE-HALF TABLET BY MOUTH TWICE A DAY (Patient taking differently: Take 75 mg by mouth 2 (two) times daily. (BETA BLOCKER)) 270 tablet 0  . mirabegron ER (MYRBETRIQ) 25 MG TB24 tablet Take 1 tablet (25 mg total) by mouth daily. 90 tablet 2  . Semaglutide,0.25 or 0.5MG /DOS, (OZEMPIC, 0.25 OR 0.5 MG/DOSE,) 2 MG/1.5ML SOPN Inject 0.5 mg into the skin once a week. (Patient taking differently: Inject 0.25-0.5 mg into the skin See admin instructions. Take once a week. Take 0.25 mg for three weeks, then take 0.5 mg for four weeks) 1.5 mL 2  . mupirocin ointment (BACTROBAN) 2 % Apply to great toe wound daily, with gauze and tape (Patient taking differently: Apply 1 application topically See admin instructions. Apply to great toe wound daily, with gauze and tape) 30 g 1   No facility-administered medications prior to visit.    No Known Allergies  ROS Review of Systems A fourteen system review of systems was performed and found to be positive as per HPI.    Objective:    Physical Exam General:  Well Developed, cooperative, in no acute distress  Neuro:  Alert and oriented,  extra-ocular muscles intact  HEENT:  Normocephalic, atraumatic, neck supple Skin:  no gross rash, warm, dry. Cardiac:  RRR, S1 S2, +murmur Respiratory:  ECTA B/L w/o wheezing, Not using accessory muscles, speaking in full sentences- unlabored. MSK:  Good ROM, no deformity Psych:  No HI/SI, judgement and insight good, Euthymic mood. Full  Affect.  BP 135/66   Pulse 91   Temp (!) 97.4 F (36.3 C)   Ht 5\' 11"  (1.803 m)   Wt 237 lb 8 oz (107.7 kg)   SpO2 100%   BMI 33.12 kg/m  Wt Readings from Last 3 Encounters:  12/28/20 237 lb 8 oz (107.7 kg)  12/26/20 235 lb 6.4 oz (106.8 kg)  12/06/20 230 lb (104.3 kg)     Health Maintenance Due  Topic Date Due  . Hepatitis C Screening  Never done    There are no preventive care reminders to display for this patient.  Lab Results  Component Value Date   TSH 2.49 11/30/2020   Lab Results  Component Value Date   WBC 4.8 10/16/2020   HGB 15.1 10/16/2020   HCT 45.1 10/16/2020   MCV 91 10/16/2020   PLT 168 10/16/2020   Lab Results  Component Value Date   NA 138 11/30/2020   K 4.5 11/30/2020   CO2 29 11/30/2020   GLUCOSE 248 (H) 11/30/2020   BUN 30 (H) 11/30/2020   CREATININE 1.85 (H) 11/30/2020   BILITOT 0.6 11/30/2020   ALKPHOS 29 (L) 11/30/2020   AST 20 11/30/2020   ALT 21 11/30/2020   PROT 7.2 11/30/2020   ALBUMIN 4.3 11/30/2020   CALCIUM 10.2 11/30/2020   ANIONGAP 10 11/08/2020   GFR 34.79 (L) 11/30/2020   Lab Results  Component Value Date   CHOL 209 (H) 11/30/2020   Lab Results  Component Value Date   HDL 38.20 (L) 11/30/2020   Lab Results  Component Value Date   LDLCALC 145 (H) 11/30/2020   Lab Results  Component Value Date   TRIG 133.0 11/30/2020   Lab Results  Component Value Date   CHOLHDL 5 11/30/2020   Lab Results  Component Value Date   HGBA1C 8.7 (H) 11/30/2020      Assessment & Plan:   Problem List Items Addressed  This Visit      Cardiovascular and Mediastinum   PAD (peripheral artery disease) ([place])     Endocrine   Mixed diabetic hyperlipidemia associated with type 2 diabetes mellitus ([place])   Hypothyroidism   Type II diabetes mellitus, uncontrolled ([place]) - Primary     Other   Cellulitis of right toe   Relevant Medications   mupirocin ointment (BACTROBAN) 2 %     Uncontrolled Type II diabetes  mellitus: -Followed by Endocrinology. Last A1c 8.7, above goal. -Discussed with patient the importance to control diabetes to reduce risk/prevent further progression of long term complications. Advised to reduce sweets and carbohydrates. -Continue current medication regimen (glipizide, insulin degludec, insulin lispro, ozempic). -Continue ambulatory glucose monitoring. -Will continue to monitor alongside endocrinology.  Mixed diabetic hyperlipidemia associated with type 2 diabetes mellitus: -Last lipid panel: total cholesterol 209, triglycerides 133, HDL 38.2, LDL 145 (goal <70) -Patient reports compliance with Atorvastatin 40 mg and choline fenofibrate135 mg. (was not taking Atorvastatin at time of lipid panel check) -Recommend to continue to reduce saturated and trans fats.  PAD: -Followed by Cardiology. -S/p left BKA, orbital atherectomy and drug-coated balloon angioplasty to right SFA and popliteal arteries. -Reviewed recent consult note. Plavix discontinued. On Pradaxa.   Hypothyroidism:  -Recent TSH 2.49, free T4 1.13 wnl -Continue current medication regimen. -Followed by Endocrinology.  H/o of cellulitis of right second toe: -Discussed with patient will provide one refill of mupirocin and recommend further refills by podiatry.  -Follow up with Podiatry as instructed.   Meds ordered this encounter  Medications  . mupirocin ointment (BACTROBAN) 2 %    Sig: Apply to great toe wound daily, with gauze and tape    Dispense:  30 g    Refill:  0    Order Specific Question:   Supervising Provider    Answer:   Beatrice Lecher D [[id number]]    Follow-up: Return in about 3 months (around 03/27/2021) for DM, HTN, HLD.   Note:  This note was prepared with assistance of Dragon voice recognition software. Occasional wrong-word or sound-a-like substitutions may have occurred due to the inherent limitations of voice recognition software.   Lorrene Reid, [name]

## 2020-12-28 NOTE — Patient Instructions (Signed)
Diabetes Mellitus and Nutrition, Adult When you have diabetes, or diabetes mellitus, it is very important to have healthy eating habits because your blood sugar (glucose) levels are greatly affected by what you eat and drink. Eating healthy foods in the right amounts, at about the same times every day, can help you:  Control your blood glucose.  Lower your risk of heart disease.  Improve your blood pressure.  Reach or maintain a healthy weight. What can affect my meal plan? Every person with diabetes is different, and each person has different needs for a meal plan. Your health care provider may recommend that you work with a dietitian to make a meal plan that is best for you. Your meal plan may vary depending on factors such as:  The calories you need.  The medicines you take.  Your weight.  Your blood glucose, blood pressure, and cholesterol levels.  Your activity level.  Other health conditions you have, such as heart or kidney disease. How do carbohydrates affect me? Carbohydrates, also called carbs, affect your blood glucose level more than any other type of food. Eating carbs naturally raises the amount of glucose in your blood. Carb counting is a method for keeping track of how many carbs you eat. Counting carbs is important to keep your blood glucose at a healthy level, especially if you use insulin or take certain oral diabetes medicines. It is important to know how many carbs you can safely have in each meal. This is different for every person. Your dietitian can help you calculate how many carbs you should have at each meal and for each snack. How does alcohol affect me? Alcohol can cause a sudden decrease in blood glucose (hypoglycemia), especially if you use insulin or take certain oral diabetes medicines. Hypoglycemia can be a life-threatening condition. Symptoms of hypoglycemia, such as sleepiness, dizziness, and confusion, are similar to symptoms of having too much  alcohol.  Do not drink alcohol if: ? Your health care provider tells you not to drink. ? You are pregnant, may be pregnant, or are planning to become pregnant.  If you drink alcohol: ? Do not drink on an empty stomach. ? Limit how much you use to:  0-1 drink a day for women.  0-2 drinks a day for men. ? Be aware of how much alcohol is in your drink. In the [place] one drink equals one 12 oz bottle of beer (355 mL), one 5 oz glass of wine (148 mL), or one 1 oz glass of hard liquor (44 mL). ? Keep yourself hydrated with water, diet soda, or unsweetened iced tea.  Keep in mind that regular soda, juice, and other mixers may contain a lot of sugar and must be counted as carbs. What are tips for following this plan? Reading food labels  Start by checking the serving size on the "Nutrition Facts" label of packaged foods and drinks. The amount of calories, carbs, fats, and other nutrients listed on the label is based on one serving of the item. Many items contain more than one serving per package.  Check the total grams (g) of carbs in one serving. You can calculate the number of servings of carbs in one serving by dividing the total carbs by 15. For example, if a food has 30 g of total carbs per serving, it would be equal to 2 servings of carbs.  Check the number of grams (g) of saturated fats and trans fats in one serving. Choose foods that have   a low amount or none of these fats.  Check the number of milligrams (mg) of salt (sodium) in one serving. Most people should limit total sodium intake to less than 2,300 mg per day.  Always check the nutrition information of foods labeled as "low-fat" or "nonfat." These foods may be higher in added sugar or refined carbs and should be avoided.  Talk to your dietitian to identify your daily goals for nutrients listed on the label. Shopping  Avoid buying canned, pre-made, or processed foods. These foods tend to be high in fat, sodium, and added  sugar.  Shop around the outside edge of the grocery store. This is where you will most often find fresh fruits and vegetables, bulk grains, fresh meats, and fresh dairy. Cooking  Use low-heat cooking methods, such as baking, instead of high-heat cooking methods like deep frying.  Cook using healthy oils, such as olive, canola, or sunflower oil.  Avoid cooking with butter, cream, or high-fat meats. Meal planning  Eat meals and snacks regularly, preferably at the same times every day. Avoid going long periods of time without eating.  Eat foods that are high in fiber, such as fresh fruits, vegetables, beans, and whole grains. Talk with your dietitian about how many servings of carbs you can eat at each meal.  Eat 4-6 oz (112-168 g) of lean protein each day, such as lean meat, chicken, fish, eggs, or tofu. One ounce (oz) of lean protein is equal to: ? 1 oz (28 g) of meat, chicken, or fish. ? 1 egg. ?  cup (62 g) of tofu.  Eat some foods each day that contain healthy fats, such as avocado, nuts, seeds, and fish.   What foods should I eat? Fruits Berries. Apples. Oranges. Peaches. Apricots. Plums. Grapes. Mango. Papaya. Pomegranate. Kiwi. Cherries. Vegetables Lettuce. Spinach. Leafy greens, including kale, chard, collard greens, and mustard greens. Beets. Cauliflower. Cabbage. Broccoli. Carrots. Green beans. Tomatoes. Peppers. Onions. Cucumbers. Brussels sprouts. Grains Whole grains, such as whole-wheat or whole-grain bread, crackers, tortillas, cereal, and pasta. Unsweetened oatmeal. Quinoa. Brown or wild rice. Meats and other proteins Seafood. Poultry without skin. Lean cuts of poultry and beef. Tofu. Nuts. Seeds. Dairy Low-fat or fat-free dairy products such as milk, yogurt, and cheese. The items listed above may not be a complete list of foods and beverages you can eat. Contact a dietitian for more information. What foods should I avoid? Fruits Fruits canned with  syrup. Vegetables Canned vegetables. Frozen vegetables with butter or cream sauce. Grains Refined white flour and flour products such as bread, pasta, snack foods, and cereals. Avoid all processed foods. Meats and other proteins Fatty cuts of meat. Poultry with skin. Breaded or fried meats. Processed meat. Avoid saturated fats. Dairy Full-fat yogurt, cheese, or milk. Beverages Sweetened drinks, such as soda or iced tea. The items listed above may not be a complete list of foods and beverages you should avoid. Contact a dietitian for more information. Questions to ask a health care provider  Do I need to meet with a diabetes educator?  Do I need to meet with a dietitian?  What number can I call if I have questions?  When are the best times to check my blood glucose? Where to find more information:  American Diabetes Association: diabetes.org  Academy of Nutrition and Dietetics: www.eatright.org  National Institute of Diabetes and Digestive and Kidney Diseases: www.niddk.nih.gov  Association of Diabetes Care and Education Specialists: www.diabeteseducator.org Summary  It is important to have healthy eating   habits because your blood sugar (glucose) levels are greatly affected by what you eat and drink.  A healthy meal plan will help you control your blood glucose and maintain a healthy lifestyle.  Your health care provider may recommend that you work with a dietitian to make a meal plan that is best for you.  Keep in mind that carbohydrates (carbs) and alcohol have immediate effects on your blood glucose levels. It is important to count carbs and to use alcohol carefully. This information is not intended to replace advice given to you by your health care provider. Make sure you discuss any questions you have with your health care provider. Document Revised: 10/05/2019 Document Reviewed: 10/05/2019 Elsevier Patient Education  2021 Elsevier Inc.  

## 2020-12-29 NOTE — Telephone Encounter (Signed)
Paperwork was faxed off.

## 2020-12-29 NOTE — Progress Notes (Signed)
Remote pacemaker transmission.   

## 2021-01-01 DIAGNOSIS — H6123 Impacted cerumen, bilateral: Secondary | ICD-10-CM | POA: Diagnosis not present

## 2021-01-09 DIAGNOSIS — I5032 Chronic diastolic (congestive) heart failure: Secondary | ICD-10-CM | POA: Diagnosis not present

## 2021-01-09 DIAGNOSIS — E559 Vitamin D deficiency, unspecified: Secondary | ICD-10-CM | POA: Diagnosis not present

## 2021-01-09 DIAGNOSIS — I2510 Atherosclerotic heart disease of native coronary artery without angina pectoris: Secondary | ICD-10-CM | POA: Diagnosis not present

## 2021-01-09 DIAGNOSIS — E1122 Type 2 diabetes mellitus with diabetic chronic kidney disease: Secondary | ICD-10-CM | POA: Diagnosis not present

## 2021-01-09 DIAGNOSIS — I350 Nonrheumatic aortic (valve) stenosis: Secondary | ICD-10-CM | POA: Diagnosis not present

## 2021-01-09 DIAGNOSIS — N1832 Chronic kidney disease, stage 3b: Secondary | ICD-10-CM | POA: Diagnosis not present

## 2021-01-09 DIAGNOSIS — I129 Hypertensive chronic kidney disease with stage 1 through stage 4 chronic kidney disease, or unspecified chronic kidney disease: Secondary | ICD-10-CM | POA: Diagnosis not present

## 2021-01-10 NOTE — Patient Outreach (Signed)
[facility]) Care Management  [facility] Care Manager  [id number] Late entry  YI HAUGAN 08-24-1941 [id number]  [place] telephone call to patient.  Hipaa compliance verified. Per patient he is doing better His A1C is 8.7. He has lost 30 pounds since being on the diet with his wife. They are tryng to eat healthier. Patient had not checked his blood sugar yet. RN explained that it is important for him to check especially with dieting and weight loss. RN encouraged exercising. Patient has agreed to follow up outreach calls.  Encounter Medications:  Outpatient Encounter Medications as of 12/20/2020  Medication Sig  . amiodarone (PACERONE) 200 MG tablet Take 200 mg by mouth daily.  [name] Kitchen atorvastatin (LIPITOR) 40 MG tablet Take 1 tablet (40 mg total) by mouth daily.  . Blood Glucose Monitoring Suppl (FREESTYLE LITE) DEVI 1 each by Does not apply route 2 (two) times daily.  . Choline Fenofibrate (FENOFIBRIC ACID) 135 MG CPDR TAKE 1 CAPSULE BY MOUTH EVERY DAY  . CLICKFINE PEN NEEDLES 32G X 4 MM MISC USE FOUR TIMES DAILY FOR DIABETES  . dabigatran (PRADAXA) 150 MG CAPS capsule Take 1 capsule (150 mg total) by mouth every 12 (twelve) hours.  . docusate sodium (COLACE) 100 MG capsule Take 300 mg by mouth daily.   [name] Kitchen FREESTYLE LITE test strip 1 EACH BY OTHER ROUTE 4 (FOUR) TIMES DAILY - BEFORE MEALS AND AT BEDTIME.  . furosemide (LASIX) 20 MG tablet Take 1 tablet (20 mg total) by mouth every other day. (Patient taking differently: Take 20 mg by mouth daily.)  . glipiZIDE (GLUCOTROL XL) 10 MG 24 hr tablet Take 1 tablet (10 mg total) by mouth daily with breakfast.  . insulin degludec (TRESIBA FLEXTOUCH) 200 UNIT/ML FlexTouch Pen Inject 26 Units into the skin at bedtime. (Patient taking differently: Inject 16 Units into the skin at bedtime.)  . insulin lispro (HUMALOG KWIKPEN) 100 UNIT/ML KwikPen Inject 5-8 Units into the skin See admin instructions. Inject 5-8 units under the skin up to  three times daily if eating sweets or high carb foods.  [name] Kitchen levothyroxine (SYNTHROID) 200 MCG tablet TAKE 1 TABLET BY MOUTH DAILY MONDAY-SATURDAY  TAKE 1/2 TABLET ON SUNDAY  . metoprolol tartrate (LOPRESSOR) 50 MG tablet TAKE ONE AND ONE-HALF TABLET BY MOUTH TWICE A DAY (Patient taking differently: Take 75 mg by mouth 2 (two) times daily. (BETA BLOCKER))  . mirabegron ER (MYRBETRIQ) 25 MG TB24 tablet Take 1 tablet (25 mg total) by mouth daily.  . Semaglutide,0.25 or 0.5MG /DOS, (OZEMPIC, 0.25 OR 0.5 MG/DOSE,) 2 MG/1.5ML SOPN Inject 0.5 mg into the skin once a week. (Patient taking differently: Inject 0.25-0.5 mg into the skin See admin instructions. Take once a week. Take 0.25 mg for three weeks, then take 0.5 mg for four weeks)  . [DISCONTINUED] clopidogrel (PLAVIX) 75 MG tablet Take 75 mg by mouth daily.  . [DISCONTINUED] mupirocin ointment (BACTROBAN) 2 % Apply to great toe wound daily, with gauze and tape (Patient taking differently: Apply 1 application topically See admin instructions. Apply to great toe wound daily, with gauze and tape)   No facility-administered encounter medications on file as of 12/20/2020.    Functional Status:  In your present state of health, do you have any difficulty performing the following activities: 12/28/2020 09/26/2020  Hearing? N N  Vision? N N  Difficulty concentrating or making decisions? N N  Walking or climbing stairs? N N  Dressing or bathing? N N  Doing errands, shopping?  N N  Some recent data might be hidden    Fall/Depression Screening: Fall Risk  12/28/2020 12/20/2020 09/26/2020  Falls in the past year? 0 1 0  Number falls in past yr: - 1 -  Injury with Fall? - 0 -  Risk for fall due to : - History of fall(s);Impaired balance/gait;Impaired mobility -  Follow up Falls evaluation completed Falls evaluation completed;Falls prevention discussed Falls evaluation completed   PHQ 2/9 Scores 12/28/2020 09/26/2020 05/25/2020 01/24/2020 01/06/2020 10/06/2019  07/02/2019  PHQ - 2 Score 0 0 0 0 0 0 0  PHQ- 9 Score 0 0 0 0 - 1 -    Assessment:  Goals Addressed            This Visit's Progress   . (THN)Make and Keep All Appointments   On track    Timeframe:  Long-Range Goal Priority:  Medium Start Date:    35009381                         Expected End Date:      82993716                Follow Up Date [id number]   - call to cancel if needed - keep a calendar with prescription refill dates - keep a calendar with appointment dates    Why is this important?   Part of staying healthy is seeing the doctor for follow-up care.  If you forget your appointments, there are some things you can do to stay on track.    Notes:     . (THN)Monitor and Manage My Blood Sugar   On track    Timeframe:  Long-Range Goal Priority:  High Start Date:   01751025                          Expected End Date:    85277824                  Follow Up Date 23536144   - check blood sugar at prescribed times - check blood sugar if I feel it is too high or too low - enter blood sugar readings and medication or insulin into daily log - take the blood sugar log to all doctor visits - take the blood sugar meter to all doctor visits    Why is this important?   Checking your blood sugar at home helps to keep it from getting very high or very low.  Writing the results in a diary or log helps the doctor know how to care for you.  Your blood sugar log should have the time, date and the results.  Also, write down the amount of insulin or other medicine that you take.  Other information, like what you ate, exercise done and how you were feeling, will also be helpful.     Notes:     . [facility] Eye Exam   On track    Timeframe:  Long-Range Goal Priority:  Medium Start Date:        31540086                     Expected End Date:   76195093                   Follow Up Date 26712458   - keep appointment with eye doctor - schedule appointment with eye doctor  Why is  this important?   Eye check-ups are important when you have diabetes.  Vision loss can be prevented.    Notes:  Eye exam [id number]    . (THN)Perform Foot Care   On track    Timeframe:  Long-Range Goal Priority:  Medium Start Date:   47425956                          Expected End Date:   38756433                   Follow Up Date 29518841  - check feet daily for cuts, sores or redness - keep feet up while sitting - trim toenails straight across - wash and dry feet carefully every day - wear comfortable, cotton socks - wear comfortable, well-fitting shoes    Why is this important?   Good foot care is very important when you have diabetes.  There are many things you can do to keep your feet healthy and catch a problem early.    Notes:  Last Podiatry visit was [id number]    . (THN)Set My Target A1C   On track    Timeframe:  Long-Range Goal Priority:  High Start Date:      01093235                       Expected End Date:     57322025                 Follow Up Date 42706237   - set target A1C    Why is this important?   Your target A1C is decided together by you and your doctor.  It is based on several things like your age and other health issues.    Notes:  A1C target 7.0 [id number] A1C 8.7 down from 8.8       Plan:  Follow-up:  Patient agrees to Care Plan and Follow-up. Provided a calendar book RN discussed monitoring blood sugars RN encouraged exercising RN sent F.A.S.T. acronym picture sheet for stroke RN sent update assessment to PCP  [facility] [phone]

## 2021-01-18 ENCOUNTER — Telehealth: Payer: Self-pay | Admitting: Endocrinology

## 2021-01-18 NOTE — Telephone Encounter (Signed)
MEDICATION:   Semaglutide,0.25 or 0.5MG /DOS, (OZEMPIC, 0.25 OR 0.5 MG/DOSE,) 2 MG/1.5ML SOPN   PHARMACY:    [facility] [place] [facility] Registered [place] Sites Phone:  [phone]  Fax:  [phone]       HAS THE PATIENT CONTACTED [place]?  Yes-Patient was provided a phone# for [facility] wich is ph# [phone]  IS THIS A 90 DAY SUPPLY : Yes  IS PATIENT OUT OF MEDICATION: Almost  IF NOT; HOW MUCH IS LEFT: Approx. 5-7 days which is how long it will [facility] to deliver the medication listed above  LAST APPOINTMENT DATE: @2 /16/2022  NEXT APPOINTMENT DATE:@4 /21/2022  DO WE HAVE YOUR PERMISSION TO LEAVE A DETAILED MESSAGE?: Yes  OTHER COMMENTS: Patient states that the RX can be expedited by calling the 90 day RX in for the medication listed above to [facility]   ph# [phone]  **Let patient know to contact pharmacy at the end of the day to make sure medication is ready. **  ** Please notify patient to allow 48-72 hours to process**  **Encourage patient to contact the pharmacy for refills or they can request refills through [facility]**

## 2021-01-22 ENCOUNTER — Other Ambulatory Visit: Payer: Self-pay | Admitting: *Deleted

## 2021-01-22 DIAGNOSIS — Z794 Long term (current) use of insulin: Secondary | ICD-10-CM

## 2021-01-22 DIAGNOSIS — E1165 Type 2 diabetes mellitus with hyperglycemia: Secondary | ICD-10-CM

## 2021-01-22 MED ORDER — OZEMPIC (0.25 OR 0.5 MG/DOSE) 2 MG/1.5ML ~~LOC~~ SOPN: 0.5000 mg | PEN_INJECTOR | SUBCUTANEOUS | 2 refills | Status: DC

## 2021-01-22 NOTE — Telephone Encounter (Signed)
Sent Rx Ozempic to [facility].

## 2021-01-26 ENCOUNTER — Other Ambulatory Visit: Payer: Medicare Other

## 2021-01-26 ENCOUNTER — Other Ambulatory Visit: Payer: Self-pay

## 2021-01-26 DIAGNOSIS — N1832 Chronic kidney disease, stage 3b: Secondary | ICD-10-CM | POA: Diagnosis not present

## 2021-02-05 ENCOUNTER — Encounter: Payer: Self-pay | Admitting: Cardiology

## 2021-02-05 ENCOUNTER — Other Ambulatory Visit: Payer: Self-pay

## 2021-02-05 ENCOUNTER — Ambulatory Visit (INDEPENDENT_AMBULATORY_CARE_PROVIDER_SITE_OTHER): Payer: Medicare Other | Admitting: Cardiology

## 2021-02-05 VITALS — BP 116/68 | HR 79 | Ht 70.0 in | Wt 233.0 lb

## 2021-02-05 DIAGNOSIS — I2510 Atherosclerotic heart disease of native coronary artery without angina pectoris: Secondary | ICD-10-CM | POA: Diagnosis not present

## 2021-02-05 DIAGNOSIS — I4819 Other persistent atrial fibrillation: Secondary | ICD-10-CM | POA: Diagnosis not present

## 2021-02-05 NOTE — Patient Instructions (Signed)
Medication Instructions:  Your physician has recommended you make the following change in your medication:  1. STOP Amiodarone  *If you need a refill on your cardiac medications before your next appointment, please call your pharmacy*   Lab Work: None ordered   Testing/Procedures: None ordered   Follow-Up: At [facility], you and your health needs are our priority.  As part of our continuing mission to provide you with exceptional heart care, we have created designated Provider Care Teams.  These Care Teams include your primary Cardiologist (physician) and Advanced Practice Providers (APPs -  Physician Assistants and Nurse Practitioners) who all work together to provide you with the care you need, when you need it.   Your next appointment:   3 month(s)  The format for your next appointment:   In Person  Provider:   Will Camnitz, MD    Thank you for choosing [facility]!!   Rache Klimaszewski, RN [phone]    

## 2021-02-05 NOTE — Progress Notes (Signed)
Electrophysiology Office Note   Date:  02/05/2021   ID:  [name], [name] 1941-10-17, MRN [id number]  PCP:  Lorrene Reid, [name]  Cardiologist:  Angelena Form Primary Electrophysiologist:  Constance Haw, MD    No chief complaint on file.    History of Present Illness: Steven Maldonado is a 80 y.o. male who is being seen today for the evaluation of complete AV block at the request of Darlina Guys. Presenting today for electrophysiology evaluation.   He has a history significant for paroxysmal atrial fibrillation, diabetes, hypertension, hyperlipidemia, PAD, coronary artery disease, severe aortic stenosis status post TAVR complicated by complete heart block.  He is status post Medtronic dual-chamber pacemaker.  He is now status post AF ablation 11/08/2020.  Today, denies symptoms of palpitations, chest pain, shortness of breath, orthopnea, PND, lower extremity edema, claudication, dizziness, presyncope, syncope, bleeding, or neurologic sequela. The patient is tolerating medications without difficulties.  Since last being seen he has done well.  He has no chest pain or shortness of breath.  He is able do all of his daily activities and is without restriction.  He has noted no further episodes of atrial fibrillation.  Past Medical History:  Diagnosis Date  . Chronic diastolic CHF (congestive heart failure) ([place])   . CKD (chronic kidney disease), stage III ([place])   . Constipation   . Coronary artery disease    a. Cath February 2012 in [place], occluded RCA with collaterals  . DM type 2 (diabetes mellitus, type 2) ([place])   . Essential hypertension   . Hyperlipidemia   . Neuropathy    feet  . Pacemaker    a. symptomatic brady after TAVR s/p MDT PPM by Dr. Curt Bears 12/04/17  . Persistent atrial fibrillation ([place])   . PONV (postoperative nausea and vomiting)    after valve surgery  . PVD (peripheral vascular disease) ([place])    a. s/p R popliteal artery stenosis tx with drug-coated  balloon 05/2014, followed by Dr. Fletcher Anon.  . 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 Severe aortic stenosis    a. 12/02/17: s/p TAVR  . Skin cancer   . Sleep apnea with use of continuous positive airway pressure (CPAP)    04-11-11 AHI was 32.9 and titrated to 15 cm H20, DME is [facility]  . Subclinical hypothyroidism    Past Surgical History:  Procedure Laterality Date  . ABDOMINAL ANGIOGRAM N/A 06/08/2014   Procedure: ABDOMINAL ANGIOGRAM;  Surgeon: Wellington Hampshire, MD;  Location: [facility] CATH LAB;  Service: Cardiovascular;  Laterality: N/A;  . ABDOMINAL AORTOGRAM N/A 04/09/2018   Procedure: ABDOMINAL AORTOGRAM;  Surgeon: Conrad Oshkosh, MD;  Location: [place] CV LAB;  Service: Cardiovascular;  Laterality: N/A;  . ABDOMINAL AORTOGRAM W/LOWER EXTREMITY N/A 08/23/2020   Procedure: ABDOMINAL AORTOGRAM W/LOWER EXTREMITY;  Surgeon: Wellington Hampshire, MD;  Location: [place] CV LAB;  Service: Cardiovascular;  Laterality: N/A;  . AMPUTATION Left 04/17/2018   Procedure: LEFT FOOT 3RD RAY AMPUTATION;  Surgeon: Newt Minion, MD;  Location: [place];  Service: Orthopedics;  Laterality: Left;  . AMPUTATION Left 05/28/2018   Procedure: LEFT AMPUTATION BELOW KNEE;  Surgeon: Wylene Simmer, MD;  Location: [place];  Service: Orthopedics;  Laterality: Left;  . APPENDECTOMY  1965  . ATRIAL FIBRILLATION ABLATION N/A 11/08/2020   Procedure: ATRIAL FIBRILLATION ABLATION;  Surgeon: Constance Haw, MD;  Location: [place] CV LAB;  Service: Cardiovascular;  Laterality: N/A;  . 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 CARDIAC CATHETERIZATION  12/2010  . CARDIOVERSION  07/2011  . CARDIOVERSION N/A 04/18/2014   Procedure: CARDIOVERSION;  Surgeon: Dorothy Spark, MD;  Location: [place];  Service: Cardiovascular;  Laterality: N/A;  . CARDIOVERSION N/A 11/03/2015   Procedure: CARDIOVERSION;   Surgeon: Lelon Perla, MD;  Location: [facility] ENDOSCOPY;  Service: Cardiovascular;  Laterality: N/A;  . CARDIOVERSION N/A 05/08/2017   Procedure: CARDIOVERSION;  Surgeon: Dorothy Spark, MD;  Location: [facility] ENDOSCOPY;  Service: Cardiovascular;  Laterality: N/A;  . CARDIOVERSION N/A 07/28/2017   Procedure: CARDIOVERSION;  Surgeon: Dorothy Spark, MD;  Location: [facility] ENDOSCOPY;  Service: Cardiovascular;  Laterality: N/A;  . CARDIOVERSION N/A 02/08/2020   Procedure: CARDIOVERSION;  Surgeon: Buford Dresser, MD;  Location: [facility] ENDOSCOPY;  Service: Cardiovascular;  Laterality: N/A;  . CARDIOVERSION N/A 03/15/2020   Procedure: CARDIOVERSION;  Surgeon: Acie Fredrickson Wonda Cheng, MD;  Location: [facility] ENDOSCOPY;  Service: Cardiovascular;  Laterality: N/A;  . CARDIOVERSION N/A 09/20/2020   Procedure: CARDIOVERSION;  Surgeon: Werner Lean, MD;  Location: [facility] ENDOSCOPY;  Service: Cardiovascular;  Laterality: N/A;  . LOWER EXTREMITY ANGIOGRAM N/A 06/08/2014   Procedure: LOWER EXTREMITY ANGIOGRAM;  Surgeon: Wellington Hampshire, MD;  Location: [facility] CATH LAB;  Service: Cardiovascular;  Laterality: N/A;  . LOWER EXTREMITY ANGIOGRAPHY Left 04/09/2018   Procedure: Lower Extremity Angiography;  Surgeon: Conrad [place], MD;  Location: [place] CV LAB;  Service: Cardiovascular;  Laterality: Left;  . PACEMAKER IMPLANT N/A 12/04/2017   Procedure: PACEMAKER IMPLANT;  Surgeon: Constance Haw, MD;  Location: [place] CV LAB;  Service: Cardiovascular;  Laterality: N/A;  . PERIPHERAL VASCULAR ATHERECTOMY  08/23/2020   Procedure: PERIPHERAL VASCULAR ATHERECTOMY;  Surgeon: Wellington Hampshire, MD;  Location: [place] CV LAB;  Service: Cardiovascular;;  . PERIPHERAL VASCULAR BALLOON ANGIOPLASTY Left 04/09/2018   Procedure: PERIPHERAL VASCULAR BALLOON ANGIOPLASTY;  Surgeon: Conrad Curtis, MD;  Location: [place] CV LAB;  Service: Cardiovascular;  Laterality: Left;  SFA  . POPLITEAL ARTERY ANGIOPLASTY Right 06/08/2014    Archie Endo 06/08/2014  . RIGHT/LEFT HEART CATH AND CORONARY ANGIOGRAPHY N/A 10/08/2017   Procedure: RIGHT/LEFT HEART CATH AND CORONARY ANGIOGRAPHY;  Surgeon: Burnell Blanks, MD;  Location: [place] CV LAB;  Service: Cardiovascular;  Laterality: N/A;  . SKIN CANCER EXCISION Bilateral    "have had them cut off back of neck X 2; off left upper arm; right wrist, near right shoulder blade" (06/08/2014)  . TEE WITHOUT CARDIOVERSION N/A 12/02/2017   Procedure: TRANSESOPHAGEAL ECHOCARDIOGRAM (TEE);  Surgeon: Burnell Blanks, MD;  Location: [place];  Service: Open Heart Surgery;  Laterality: N/A;  . TEMPORARY PACEMAKER N/A 12/04/2017   Procedure: TEMPORARY PACEMAKER;  Surgeon: Leonie Man, MD;  Location: [place] CV LAB;  Service: Cardiovascular;  Laterality: N/A;  . TRANSCATHETER AORTIC VALVE REPLACEMENT, TRANSFEMORAL N/A 12/02/2017   Procedure: TRANSCATHETER AORTIC VALVE REPLACEMENT, TRANSFEMORAL;  Surgeon: Burnell Blanks, MD;  Location: [place];  Service: Open Heart Surgery;  Laterality: N/A;  using Edwards Sapien 3 Transcatheter Heart Valve size 53mm     Current Outpatient Medications  Medication Sig Dispense Refill  . amiodarone (PACERONE) 200 MG tablet Take 200 mg by mouth daily.    [name] Kitchen atorvastatin (LIPITOR) 40 MG tablet Take 1 tablet (40 mg total) by mouth daily. 90 tablet 3  . Blood Glucose Monitoring Suppl (FREESTYLE LITE) DEVI 1 each by  Does not apply route 2 (two) times daily. 1 each 0  . Choline Fenofibrate (FENOFIBRIC ACID) 135 MG CPDR TAKE 1 CAPSULE BY MOUTH EVERY DAY 90 capsule 0  . CLICKFINE PEN NEEDLES 32G X 4 MM MISC USE FOUR TIMES DAILY FOR DIABETES 200 each 2  . dabigatran (PRADAXA) 150 MG CAPS capsule Take 1 capsule (150 mg total) by mouth every 12 (twelve) hours. 180 capsule 0  . docusate sodium (COLACE) 100 MG capsule Take 300 mg by mouth daily.     [name] Kitchen FREESTYLE LITE test strip 1 EACH BY OTHER ROUTE 4 (FOUR) TIMES DAILY - BEFORE MEALS AND AT BEDTIME. 450  strip 3  . furosemide (LASIX) 20 MG tablet Take 1 tablet (20 mg total) by mouth every other day. (Patient taking differently: Take 20 mg by mouth daily.)    . glipiZIDE (GLUCOTROL XL) 10 MG 24 hr tablet Take 1 tablet (10 mg total) by mouth daily with breakfast. 90 tablet 1  . insulin degludec (TRESIBA FLEXTOUCH) 200 UNIT/ML FlexTouch Pen Inject 26 Units into the skin at bedtime. (Patient taking differently: Inject 16 Units into the skin at bedtime.) 15 mL 1  . insulin lispro (HUMALOG KWIKPEN) 100 UNIT/ML KwikPen Inject 5-8 Units into the skin See admin instructions. Inject 5-8 units under the skin up to three times daily if eating sweets or high carb foods. 30 mL 3  . levothyroxine (SYNTHROID) 200 MCG tablet TAKE 1 TABLET BY MOUTH DAILY MONDAY-SATURDAY  TAKE 1/2 TABLET ON SUNDAY 30 tablet 5  . metoprolol tartrate (LOPRESSOR) 50 MG tablet TAKE ONE AND ONE-HALF TABLET BY MOUTH TWICE A DAY (Patient taking differently: Take 75 mg by mouth 2 (two) times daily. (BETA BLOCKER)) 270 tablet 0  . mirabegron ER (MYRBETRIQ) 25 MG TB24 tablet Take 1 tablet (25 mg total) by mouth daily. 90 tablet 2  . mupirocin ointment (BACTROBAN) 2 % Apply to great toe wound daily, with gauze and tape 30 g 0  . Semaglutide,0.25 or 0.5MG /DOS, (OZEMPIC, 0.25 OR 0.5 MG/DOSE,) 2 MG/1.5ML SOPN Inject 0.5 mg into the skin once a week. 1.5 mL 2   No current facility-administered medications for this visit.    Allergies:   Patient has no allergy information on record.   Social History:  The patient  reports that he quit smoking about 48 years ago. His smoking use included cigarettes. He has a 28.00 pack-year smoking history. He has never used smokeless tobacco. He reports that he does not drink alcohol and does not use drugs.   Family History:  The patient's family history includes Diabetes in his brother, daughter, father, mother, sister, and another family member; Heart Problems in his daughter; Heart attack in his father and  mother; Heart failure in his father; Hypertension in his brother, father, mother, and sister; Stroke in his brother.   ROS:  Please see the history of present illness.   Otherwise, review of systems is positive for none.   All other systems are reviewed and negative.   PHYSICAL EXAM: VS:  BP 116/68   Pulse 79   Ht 5\' 10"  (1.778 m)   Wt 233 lb (105.7 kg)   BMI 33.43 kg/m  , BMI Body mass index is 33.43 kg/m. GEN: Well nourished, well developed, in no acute distress  HEENT: normal  Neck: no JVD, carotid bruits, or masses Cardiac: RRR; no murmurs, rubs, or gallops,no edema  Respiratory:  clear to auscultation bilaterally, normal work of breathing GI: soft, nontender, nondistended, + BS MS:  no deformity or atrophy  Skin: warm and dry, device site well healed Neuro:  Strength and sensation are intact Psych: euthymic mood, full affect  EKG:  EKG is ordered today. Personal review of the ekg ordered shows AV paced, rate 79  Personal review of the device interrogation today. Results in [place]: 10/16/2020: Hemoglobin 15.1; Platelets 168 11/30/2020: ALT 21; BUN 30; Creatinine, Ser 1.85; Potassium 4.5; Sodium 138; TSH 2.49    Lipid Panel     Component Value Date/Time   CHOL 209 (H) 11/30/2020 1357   CHOL 161 01/24/2020 1045   TRIG 133.0 11/30/2020 1357   HDL 38.20 (L) 11/30/2020 1357   HDL 34 (L) 01/24/2020 1045   CHOLHDL 5 11/30/2020 1357   VLDL 26.6 11/30/2020 1357   LDLCALC 145 (H) 11/30/2020 1357   LDLCALC 106 (H) 01/24/2020 1045   LDLCALC 142 (H) 05/31/2019 1246   LDLDIRECT 123.0 12/03/2019 1136     Wt Readings from Last 3 Encounters:  02/05/21 233 lb (105.7 kg)  12/28/20 237 lb 8 oz (107.7 kg)  12/26/20 235 lb 6.4 oz (106.8 kg)      Other studies Reviewed: Additional studies/ records that were reviewed today include: TTE 12/24/17  Review of the above records today demonstrates:  - Left ventricle: Inferobasal hypokinesis The cavity size was   normal.  Wall thickness was increased in a pattern of severe LVH.   Systolic function was normal. The estimated ejection fraction was   in the range of 55% to 60%. - Aortic valve: Post TAVR with no significant peri valvualr   regurgitation gradients have increased since 12/03/17. - Mitral valve: Severely calcified annulus. Moderately thickened,   moderately calcified leaflets . There was mild regurgitation. - Left atrium: The atrium was moderately dilated. - Atrial septum: No defect or patent foramen ovale was identified.   ASSESSMENT AND PLAN:  1.  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  Device appropriately.  No change this time.    2.  Persistent atrial fibrillation: Currently on amiodarone and Pradaxa.  CHA2DS2-VASc of 4.  High risk medication monitoring.  Status post AF ablation 11/08/2020.  We Mikael Debell stop amiodarone today as he is remained in sinus rhythm.  3.  Hypertension: Currently well controlled  4.  Coronary artery disease: No current chest pain  5.  Hyperlipidemia: Continue statin per primary cardiology  6.  Obstructive sleep apnea: CPAP compliance encouraged   Current medicines are reviewed at length with the patient today.   The patient does not have concerns regarding his medicines.  The following changes were made today: Stop amiodarone  Labs/ tests ordered today include:  Orders Placed This Encounter  Procedures  . EKG 12-Lead     Disposition:   FU with Jala Dundon 3 months  Signed, Mikah Rottinghaus Meredith Leeds, MD  02/05/2021 11:41 AM     [facility] [address] [name] [address] [phone] (office) [phone] (fax)

## 2021-02-20 ENCOUNTER — Other Ambulatory Visit: Payer: Self-pay | Admitting: Physician Assistant

## 2021-02-20 DIAGNOSIS — Z85828 Personal history of other malignant neoplasm of skin: Secondary | ICD-10-CM | POA: Diagnosis not present

## 2021-02-20 DIAGNOSIS — L821 Other seborrheic keratosis: Secondary | ICD-10-CM | POA: Diagnosis not present

## 2021-02-20 DIAGNOSIS — X32XXXA Exposure to sunlight, initial encounter: Secondary | ICD-10-CM | POA: Diagnosis not present

## 2021-02-20 DIAGNOSIS — D2261 Melanocytic nevi of right upper limb, including shoulder: Secondary | ICD-10-CM | POA: Diagnosis not present

## 2021-02-20 DIAGNOSIS — L570 Actinic keratosis: Secondary | ICD-10-CM | POA: Diagnosis not present

## 2021-02-20 DIAGNOSIS — D2262 Melanocytic nevi of left upper limb, including shoulder: Secondary | ICD-10-CM | POA: Diagnosis not present

## 2021-03-01 ENCOUNTER — Other Ambulatory Visit: Payer: Self-pay

## 2021-03-01 ENCOUNTER — Other Ambulatory Visit (INDEPENDENT_AMBULATORY_CARE_PROVIDER_SITE_OTHER): Payer: Medicare Other

## 2021-03-01 DIAGNOSIS — Z794 Long term (current) use of insulin: Secondary | ICD-10-CM

## 2021-03-01 DIAGNOSIS — E1165 Type 2 diabetes mellitus with hyperglycemia: Secondary | ICD-10-CM

## 2021-03-01 DIAGNOSIS — E039 Hypothyroidism, unspecified: Secondary | ICD-10-CM

## 2021-03-01 DIAGNOSIS — E782 Mixed hyperlipidemia: Secondary | ICD-10-CM

## 2021-03-01 LAB — COMPREHENSIVE METABOLIC PANEL
ALT: 20 U/L (ref 0–53)
AST: 24 U/L (ref 0–37)
Albumin: 3.9 g/dL (ref 3.5–5.2)
Alkaline Phosphatase: 39 U/L (ref 39–117)
BUN: 27 mg/dL — ABNORMAL HIGH (ref 6–23)
CO2: 26 mEq/L (ref 19–32)
Calcium: 9.9 mg/dL (ref 8.4–10.5)
Chloride: 105 mEq/L (ref 96–112)
Creatinine, Ser: 1.66 mg/dL — ABNORMAL HIGH (ref 0.40–1.50)
GFR: 39.55 mL/min — ABNORMAL LOW (ref >60.00)
Glucose, Bld: 127 mg/dL — ABNORMAL HIGH (ref 70–99)
Potassium: 4.6 mEq/L (ref 3.5–5.1)
Sodium: 140 mEq/L (ref 135–145)
Total Bilirubin: 0.3 mg/dL (ref 0.2–1.2)
Total Protein: 6.9 g/dL (ref 6.0–8.3)

## 2021-03-01 LAB — TSH: TSH: 4.41 u[IU]/mL (ref 0.35–4.50)

## 2021-03-01 LAB — LIPID PANEL
Cholesterol: 158 mg/dL (ref 0–200)
HDL: 35.8 mg/dL — ABNORMAL LOW (ref >39.00)
LDL Cholesterol: 94 mg/dL (ref 0–99)
NonHDL: 122.16
Total CHOL/HDL Ratio: 4
Triglycerides: 142 mg/dL (ref 0.0–149.0)
VLDL: 28.4 mg/dL (ref 0.0–40.0)

## 2021-03-01 LAB — T4, FREE: Free T4: 0.74 ng/dL (ref 0.60–1.60)

## 2021-03-02 LAB — HEMOGLOBIN A1C: Hgb A1c MFr Bld: 9.2 % — ABNORMAL HIGH (ref 4.6–6.5)

## 2021-03-07 ENCOUNTER — Ambulatory Visit (HOSPITAL_COMMUNITY)
Admission: RE | Admit: 2021-03-07 | Discharge: 2021-03-07 | Disposition: A | Discharge status: Home / Self Care | Payer: Medicare Other | Source: Ambulatory Visit | Attending: Cardiovascular Disease | Admitting: Cardiovascular Disease

## 2021-03-07 ENCOUNTER — Other Ambulatory Visit: Payer: Self-pay

## 2021-03-07 DIAGNOSIS — I739 Peripheral vascular disease, unspecified: Secondary | ICD-10-CM | POA: Diagnosis not present

## 2021-03-08 ENCOUNTER — Ambulatory Visit: Payer: Medicare Other | Admitting: Endocrinology

## 2021-03-09 ENCOUNTER — Other Ambulatory Visit: Payer: Self-pay | Admitting: *Deleted

## 2021-03-09 DIAGNOSIS — E13621 Other specified diabetes mellitus with foot ulcer: Secondary | ICD-10-CM | POA: Diagnosis not present

## 2021-03-09 DIAGNOSIS — I739 Peripheral vascular disease, unspecified: Secondary | ICD-10-CM

## 2021-03-12 ENCOUNTER — Encounter: Payer: Self-pay | Admitting: Endocrinology

## 2021-03-19 ENCOUNTER — Other Ambulatory Visit: Payer: Self-pay | Admitting: *Deleted

## 2021-03-19 NOTE — Patient Outreach (Signed)
[facility]) Care Management  03/19/2021  Steven Maldonado [birth year] [id number]   RN Health Coach attempted follow up outreach call to patient.  Patient asked if RNHealth Coach can call back.   Plan: RN will call patient again within 30 days.  [place] Care Management [phone]

## 2021-03-20 ENCOUNTER — Other Ambulatory Visit: Payer: Self-pay | Admitting: *Deleted

## 2021-03-20 DIAGNOSIS — E1165 Type 2 diabetes mellitus with hyperglycemia: Secondary | ICD-10-CM

## 2021-03-20 DIAGNOSIS — Z794 Long term (current) use of insulin: Secondary | ICD-10-CM

## 2021-03-20 MED ORDER — OZEMPIC (0.25 OR 0.5 MG/DOSE) 2 MG/1.5ML ~~LOC~~ SOPN: 0.5000 mg | PEN_INJECTOR | SUBCUTANEOUS | 1 refills | Status: DC

## 2021-03-26 ENCOUNTER — Ambulatory Visit (INDEPENDENT_AMBULATORY_CARE_PROVIDER_SITE_OTHER): Payer: Medicare Other

## 2021-03-26 DIAGNOSIS — I442 Atrioventricular block, complete: Secondary | ICD-10-CM

## 2021-03-27 LAB — CUP PACEART REMOTE DEVICE CHECK
Battery Remaining Longevity: 91 mo
Battery Voltage: 2.91 V
Brady Statistic AP VP Percent: 26.4 %
Brady Statistic AP VS Percent: 0.05 %
Brady Statistic AS VP Percent: 71.31 %
Brady Statistic AS VS Percent: 2.24 %
Brady Statistic RA Percent Paced: 27.74 %
Brady Statistic RV Percent Paced: 97.71 %
Date Time Interrogation Session: 20220516011339
Implantable Lead Implant Date: 20190124
Implantable Lead Implant Date: 20190124
Implantable Lead Location: 753859
Implantable Lead Location: 753860
Implantable Lead Model: 5076
Implantable Lead Model: 5076
Implantable Pulse Generator Implant Date: 20190124
Lead Channel Impedance Value: 323 Ohm
Lead Channel Impedance Value: 342 Ohm
Lead Channel Impedance Value: 361 Ohm
Lead Channel Impedance Value: 456 Ohm
Lead Channel Pacing Threshold Amplitude: 0.625 V
Lead Channel Pacing Threshold Amplitude: 0.625 V
Lead Channel Pacing Threshold Pulse Width: 0.4 ms
Lead Channel Pacing Threshold Pulse Width: 0.4 ms
Lead Channel Sensing Intrinsic Amplitude: 19.625 mV
Lead Channel Sensing Intrinsic Amplitude: 2 mV
Lead Channel Sensing Intrinsic Amplitude: 2 mV
Lead Channel Sensing Intrinsic Amplitude: 6.5 mV
Lead Channel Setting Pacing Amplitude: 2 V
Lead Channel Setting Pacing Amplitude: 2.5 V
Lead Channel Setting Pacing Pulse Width: 0.4 ms
Lead Channel Setting Sensing Sensitivity: 4 mV

## 2021-03-29 ENCOUNTER — Other Ambulatory Visit: Payer: Self-pay

## 2021-03-29 ENCOUNTER — Ambulatory Visit (INDEPENDENT_AMBULATORY_CARE_PROVIDER_SITE_OTHER): Payer: Medicare Other | Admitting: Physician Assistant

## 2021-03-29 ENCOUNTER — Encounter: Payer: Self-pay | Admitting: Physician Assistant

## 2021-03-29 VITALS — BP 126/74 | HR 79 | Temp 97.8°F

## 2021-03-29 DIAGNOSIS — E11649 Type 2 diabetes mellitus with hypoglycemia without coma: Secondary | ICD-10-CM | POA: Diagnosis not present

## 2021-03-29 DIAGNOSIS — N183 Chronic kidney disease, stage 3 unspecified: Secondary | ICD-10-CM | POA: Diagnosis not present

## 2021-03-29 DIAGNOSIS — E1169 Type 2 diabetes mellitus with other specified complication: Secondary | ICD-10-CM | POA: Diagnosis not present

## 2021-03-29 DIAGNOSIS — E782 Mixed hyperlipidemia: Secondary | ICD-10-CM

## 2021-03-29 NOTE — Patient Instructions (Signed)
Diabetes Mellitus and Nutrition, Adult When you have diabetes, or diabetes mellitus, it is very important to have healthy eating habits because your blood sugar (glucose) levels are greatly affected by what you eat and drink. Eating healthy foods in the right amounts, at about the same times every day, can help you:  Control your blood glucose.  Lower your risk of heart disease.  Improve your blood pressure.  Reach or maintain a healthy weight. What can affect my meal plan? Every person with diabetes is different, and each person has different needs for a meal plan. Your health care provider may recommend that you work with a dietitian to make a meal plan that is best for you. Your meal plan may vary depending on factors such as:  The calories you need.  The medicines you take.  Your weight.  Your blood glucose, blood pressure, and cholesterol levels.  Your activity level.  Other health conditions you have, such as heart or kidney disease. How do carbohydrates affect me? Carbohydrates, also called carbs, affect your blood glucose level more than any other type of food. Eating carbs naturally raises the amount of glucose in your blood. Carb counting is a method for keeping track of how many carbs you eat. Counting carbs is important to keep your blood glucose at a healthy level, especially if you use insulin or take certain oral diabetes medicines. It is important to know how many carbs you can safely have in each meal. This is different for every person. Your dietitian can help you calculate how many carbs you should have at each meal and for each snack. How does alcohol affect me? Alcohol can cause a sudden decrease in blood glucose (hypoglycemia), especially if you use insulin or take certain oral diabetes medicines. Hypoglycemia can be a life-threatening condition. Symptoms of hypoglycemia, such as sleepiness, dizziness, and confusion, are similar to symptoms of having too much  alcohol.  Do not drink alcohol if: ? Your health care provider tells you not to drink. ? You are pregnant, may be pregnant, or are planning to become pregnant.  If you drink alcohol: ? Do not drink on an empty stomach. ? Limit how much you use to:  0-1 drink a day for women.  0-2 drinks a day for men. ? Be aware of how much alcohol is in your drink. In the [place] one drink equals one 12 oz bottle of beer (355 mL), one 5 oz glass of wine (148 mL), or one 1 oz glass of hard liquor (44 mL). ? Keep yourself hydrated with water, diet soda, or unsweetened iced tea.  Keep in mind that regular soda, juice, and other mixers may contain a lot of sugar and must be counted as carbs. What are tips for following this plan? Reading food labels  Start by checking the serving size on the "Nutrition Facts" label of packaged foods and drinks. The amount of calories, carbs, fats, and other nutrients listed on the label is based on one serving of the item. Many items contain more than one serving per package.  Check the total grams (g) of carbs in one serving. You can calculate the number of servings of carbs in one serving by dividing the total carbs by 15. For example, if a food has 30 g of total carbs per serving, it would be equal to 2 servings of carbs.  Check the number of grams (g) of saturated fats and trans fats in one serving. Choose foods that have   a low amount or none of these fats.  Check the number of milligrams (mg) of salt (sodium) in one serving. Most people should limit total sodium intake to less than 2,300 mg per day.  Always check the nutrition information of foods labeled as "low-fat" or "nonfat." These foods may be higher in added sugar or refined carbs and should be avoided.  Talk to your dietitian to identify your daily goals for nutrients listed on the label. Shopping  Avoid buying canned, pre-made, or processed foods. These foods tend to be high in fat, sodium, and added  sugar.  Shop around the outside edge of the grocery store. This is where you will most often find fresh fruits and vegetables, bulk grains, fresh meats, and fresh dairy. Cooking  Use low-heat cooking methods, such as baking, instead of high-heat cooking methods like deep frying.  Cook using healthy oils, such as olive, canola, or sunflower oil.  Avoid cooking with butter, cream, or high-fat meats. Meal planning  Eat meals and snacks regularly, preferably at the same times every day. Avoid going long periods of time without eating.  Eat foods that are high in fiber, such as fresh fruits, vegetables, beans, and whole grains. Talk with your dietitian about how many servings of carbs you can eat at each meal.  Eat 4-6 oz (112-168 g) of lean protein each day, such as lean meat, chicken, fish, eggs, or tofu. One ounce (oz) of lean protein is equal to: ? 1 oz (28 g) of meat, chicken, or fish. ? 1 egg. ?  cup (62 g) of tofu.  Eat some foods each day that contain healthy fats, such as avocado, nuts, seeds, and fish.   What foods should I eat? Fruits Berries. Apples. Oranges. Peaches. Apricots. Plums. Grapes. Mango. Papaya. Pomegranate. Kiwi. Cherries. Vegetables Lettuce. Spinach. Leafy greens, including kale, chard, collard greens, and mustard greens. Beets. Cauliflower. Cabbage. Broccoli. Carrots. Green beans. Tomatoes. Peppers. Onions. Cucumbers. Brussels sprouts. Grains Whole grains, such as whole-wheat or whole-grain bread, crackers, tortillas, cereal, and pasta. Unsweetened oatmeal. Quinoa. Brown or wild rice. Meats and other proteins Seafood. Poultry without skin. Lean cuts of poultry and beef. Tofu. Nuts. Seeds. Dairy Low-fat or fat-free dairy products such as milk, yogurt, and cheese. The items listed above may not be a complete list of foods and beverages you can eat. Contact a dietitian for more information. What foods should I avoid? Fruits Fruits canned with  syrup. Vegetables Canned vegetables. Frozen vegetables with butter or cream sauce. Grains Refined white flour and flour products such as bread, pasta, snack foods, and cereals. Avoid all processed foods. Meats and other proteins Fatty cuts of meat. Poultry with skin. Breaded or fried meats. Processed meat. Avoid saturated fats. Dairy Full-fat yogurt, cheese, or milk. Beverages Sweetened drinks, such as soda or iced tea. The items listed above may not be a complete list of foods and beverages you should avoid. Contact a dietitian for more information. Questions to ask a health care provider  Do I need to meet with a diabetes educator?  Do I need to meet with a dietitian?  What number can I call if I have questions?  When are the best times to check my blood glucose? Where to find more information:  American Diabetes Association: diabetes.org  Academy of Nutrition and Dietetics: www.eatright.org  National Institute of Diabetes and Digestive and Kidney Diseases: www.niddk.nih.gov  Association of Diabetes Care and Education Specialists: www.diabeteseducator.org Summary  It is important to have healthy eating   habits because your blood sugar (glucose) levels are greatly affected by what you eat and drink.  A healthy meal plan will help you control your blood glucose and maintain a healthy lifestyle.  Your health care provider may recommend that you work with a dietitian to make a meal plan that is best for you.  Keep in mind that carbohydrates (carbs) and alcohol have immediate effects on your blood glucose levels. It is important to count carbs and to use alcohol carefully. This information is not intended to replace advice given to you by your health care provider. Make sure you discuss any questions you have with your health care provider. Document Revised: 10/05/2019 Document Reviewed: 10/05/2019 Elsevier Patient Education  2021 Elsevier Inc.  

## 2021-03-29 NOTE — Progress Notes (Signed)
Established Patient Office Visit  Subjective:  Patient ID: Steven Maldonado, male    DOB: 05/16/1941  Age: 80 y.o. MRN: [id number]  CC:  Chief Complaint  Patient presents with  . Follow-up  . Diabetes  . Hyperlipidemia  . Hypertension    HPI Steven Maldonado presents for follow up on diabetes mellitus, hypertension and hyperlipidemia.  Diabetes: Followed by Endocrinology. Pt denies increased urination or thirst. Pt reports medication compliance. No hypoglycemic events. Checking glucose at home. States FBS today was 141. Denies increasing carbohydrate and/or glucose intake.   HTN: Pt denies chest pain, palpitations, dizziness or increased edema from baseline.Taking medication as directed without side effects.   HLD: Pt taking medication as directed without issues. Reports does not eat fried foods. He does like to eat out and tries to have a salad but not always.   CKD: Patient reports takes Tylenol as needed for pain relief. Denies taking NASIDs.  Past Medical History:  Diagnosis Date  . Chronic diastolic CHF (congestive heart failure) ([place])   . CKD (chronic kidney disease), stage III ([place])   . Constipation   . Coronary artery disease    a. Cath February 2012 in [facility], occluded RCA with collaterals  . DM type 2 (diabetes mellitus, type 2) ([place])   . Essential hypertension   . Hyperlipidemia   . Neuropathy    feet  . Pacemaker    a. symptomatic brady after TAVR s/p MDT PPM by Dr. Curt Bears 12/04/17  . Persistent atrial fibrillation ([place])   . PONV (postoperative nausea and vomiting)    after valve surgery  . PVD (peripheral vascular disease) ([place])    a. s/p R popliteal artery stenosis tx with drug-coated balloon 05/2014, followed by Dr. Fletcher Anon.  . 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 Severe aortic stenosis    a. 12/02/17: s/p TAVR  . Skin cancer    . Sleep apnea with use of continuous positive airway pressure (CPAP)    04-11-11 AHI was 32.9 and titrated to 15 cm H20, DME is [facility]  . Subclinical hypothyroidism     Past Surgical History:  Procedure Laterality Date  . ABDOMINAL ANGIOGRAM N/A 06/08/2014   Procedure: ABDOMINAL ANGIOGRAM;  Surgeon: Wellington Hampshire, MD;  Location: [facility] CATH LAB;  Service: Cardiovascular;  Laterality: N/A;  . ABDOMINAL AORTOGRAM N/A 04/09/2018   Procedure: ABDOMINAL AORTOGRAM;  Surgeon: Conrad , MD;  Location: [place] CV LAB;  Service: Cardiovascular;  Laterality: N/A;  . ABDOMINAL AORTOGRAM W/LOWER EXTREMITY N/A 08/23/2020   Procedure: ABDOMINAL AORTOGRAM W/LOWER EXTREMITY;  Surgeon: Wellington Hampshire, MD;  Location: [place] CV LAB;  Service: Cardiovascular;  Laterality: N/A;  . AMPUTATION Left 04/17/2018   Procedure: LEFT FOOT 3RD RAY AMPUTATION;  Surgeon: Newt Minion, MD;  Location: [place];  Service: Orthopedics;  Laterality: Left;  . AMPUTATION Left 05/28/2018   Procedure: LEFT AMPUTATION BELOW KNEE;  Surgeon: Wylene Simmer, MD;  Location: [place];  Service: Orthopedics;  Laterality: Left;  . APPENDECTOMY  1965  . ATRIAL FIBRILLATION ABLATION N/A 11/08/2020   Procedure: ATRIAL FIBRILLATION ABLATION;  Surgeon: Constance Haw, MD;  Location: [place] CV LAB;  Service: Cardiovascular;  Laterality: N/A;  . 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 CARDIAC CATHETERIZATION  12/2010  . CARDIOVERSION  07/2011  . CARDIOVERSION N/A 04/18/2014   Procedure: CARDIOVERSION;  Surgeon: Dorothy Spark, MD;  Location: [place];  Service: Cardiovascular;  Laterality: N/A;  . CARDIOVERSION N/A 11/03/2015   Procedure: CARDIOVERSION;  Surgeon: Lelon Perla, MD;  Location: [facility] ENDOSCOPY;  Service: Cardiovascular;  Laterality: N/A;  . CARDIOVERSION N/A 05/08/2017   Procedure: CARDIOVERSION;  Surgeon: Dorothy Spark, MD;  Location: [place];  Service: Cardiovascular;  Laterality: N/A;  .  CARDIOVERSION N/A 07/28/2017   Procedure: CARDIOVERSION;  Surgeon: Dorothy Spark, MD;  Location: [facility] ENDOSCOPY;  Service: Cardiovascular;  Laterality: N/A;  . CARDIOVERSION N/A 02/08/2020   Procedure: CARDIOVERSION;  Surgeon: Buford Dresser, MD;  Location: [facility] ENDOSCOPY;  Service: Cardiovascular;  Laterality: N/A;  . CARDIOVERSION N/A 03/15/2020   Procedure: CARDIOVERSION;  Surgeon: Thayer Headings, MD;  Location: [facility] ENDOSCOPY;  Service: Cardiovascular;  Laterality: N/A;  . CARDIOVERSION N/A 09/20/2020   Procedure: CARDIOVERSION;  Surgeon: Werner Lean, MD;  Location: [facility] ENDOSCOPY;  Service: Cardiovascular;  Laterality: N/A;  . LOWER EXTREMITY ANGIOGRAM N/A 06/08/2014   Procedure: LOWER EXTREMITY ANGIOGRAM;  Surgeon: Wellington Hampshire, MD;  Location: [facility] CATH LAB;  Service: Cardiovascular;  Laterality: N/A;  . LOWER EXTREMITY ANGIOGRAPHY Left 04/09/2018   Procedure: Lower Extremity Angiography;  Surgeon: Conrad Flagstaff, MD;  Location: [place] CV LAB;  Service: Cardiovascular;  Laterality: Left;  . PACEMAKER IMPLANT N/A 12/04/2017   Procedure: PACEMAKER IMPLANT;  Surgeon: Constance Haw, MD;  Location: [place] CV LAB;  Service: Cardiovascular;  Laterality: N/A;  . PERIPHERAL VASCULAR ATHERECTOMY  08/23/2020   Procedure: PERIPHERAL VASCULAR ATHERECTOMY;  Surgeon: Wellington Hampshire, MD;  Location: [place] CV LAB;  Service: Cardiovascular;;  . PERIPHERAL VASCULAR BALLOON ANGIOPLASTY Left 04/09/2018   Procedure: PERIPHERAL VASCULAR BALLOON ANGIOPLASTY;  Surgeon: Conrad Wheatland, MD;  Location: [place] CV LAB;  Service: Cardiovascular;  Laterality: Left;  SFA  . POPLITEAL ARTERY ANGIOPLASTY Right 06/08/2014   Archie Endo 06/08/2014  . RIGHT/LEFT HEART CATH AND CORONARY ANGIOGRAPHY N/A 10/08/2017   Procedure: RIGHT/LEFT HEART CATH AND CORONARY ANGIOGRAPHY;  Surgeon: Burnell Blanks, MD;  Location: [place] CV LAB;  Service: Cardiovascular;  Laterality: N/A;  . SKIN  CANCER EXCISION Bilateral    "have had them cut off back of neck X 2; off left upper arm; right wrist, near right shoulder blade" (06/08/2014)  . TEE WITHOUT CARDIOVERSION N/A 12/02/2017   Procedure: TRANSESOPHAGEAL ECHOCARDIOGRAM (TEE);  Surgeon: Burnell Blanks, MD;  Location: [place];  Service: Open Heart Surgery;  Laterality: N/A;  . TEMPORARY PACEMAKER N/A 12/04/2017   Procedure: TEMPORARY PACEMAKER;  Surgeon: Leonie Man, MD;  Location: [place] CV LAB;  Service: Cardiovascular;  Laterality: N/A;  . TRANSCATHETER AORTIC VALVE REPLACEMENT, TRANSFEMORAL N/A 12/02/2017   Procedure: TRANSCATHETER AORTIC VALVE REPLACEMENT, TRANSFEMORAL;  Surgeon: Burnell Blanks, MD;  Location: [place];  Service: Open Heart Surgery;  Laterality: N/A;  using Edwards Sapien 3 Transcatheter Heart Valve size 76mm    Family History  Problem Relation Age of Onset  . Diabetes Mother   . Heart attack Mother   . Hypertension Mother   . Heart attack Father   . Heart failure Father   . Hypertension Father   . Diabetes Father   . Diabetes Sister   . Diabetes Brother   . Diabetes Other   . Diabetes Daughter        TYPE ll  . Heart Problems Daughter   . Hypertension Sister   . Hypertension Brother   . Stroke Brother  Social History   Socioeconomic History  . Marital status: Married    Spouse name: [name]  . Number of children: 1  . Years of education: 51  . Highest education level: Not on file  Occupational History  . Occupation: retired    Fish farm manager: [employer]  Tobacco Use  . Smoking status: Former Smoker    Packs/day: 2.00    Years: 14.00    Pack years: 28.00    Types: Cigarettes    Quit date: 11/11/1972    Years since quitting: 48.4  . Smokeless tobacco: Never Used  Vaping Use  . Vaping Use: Never used  Substance and Sexual Activity  . Alcohol use: No    Comment: quit in 1984  . Drug use: No  . Sexual activity: Not Currently  Other Topics Concern  . 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   Patient has one child and his wife has one child.   Patient is retired.   Patient has a high school education.   Patient is right-handed.   Patient drinks very little caffeine.   Social Determinants of Health   Financial Resource Strain: Not on file  Food Insecurity: Not on file  Transportation Needs: Not on file  Physical Activity: Not on file  Stress: Not on file  Social Connections: Not on file  Intimate Partner Violence: Not on file    Outpatient Medications Prior to Visit  Medication Sig Dispense Refill  . atorvastatin (LIPITOR) 40 MG tablet Take 1 tablet (40 mg total) by mouth daily. 90 tablet 3  . Blood Glucose Monitoring Suppl (FREESTYLE LITE) DEVI 1 each by Does not apply route 2 (two) times daily. 1 each 0  . Choline Fenofibrate (FENOFIBRIC ACID) 135 MG CPDR TAKE 1 CAPSULE BY MOUTH EVERY DAY 90 capsule 0  . CLICKFINE PEN NEEDLES 32G X 4 MM MISC USE FOUR TIMES DAILY FOR DIABETES 200 each 2  . dabigatran (PRADAXA) 150 MG CAPS capsule Take 1 capsule (150 mg total) by mouth every 12 (twelve) hours. 180 capsule 0  . docusate sodium (COLACE) 100 MG capsule Take 300 mg by mouth daily.     [name] Kitchen FREESTYLE LITE test strip 1 EACH BY OTHER ROUTE 4 (FOUR) TIMES DAILY - BEFORE MEALS AND AT BEDTIME. 450 strip 3  . furosemide (LASIX) 20 MG tablet Take 1 tablet (20 mg total) by mouth every other day. (Patient taking differently: Take 20 mg by mouth daily.)    . glipiZIDE (GLUCOTROL XL) 10 MG 24 hr tablet Take 1 tablet (10 mg total) by mouth daily with breakfast. 90 tablet 1  . insulin lispro (HUMALOG KWIKPEN) 100 UNIT/ML KwikPen Inject 5-8 Units into the skin See admin instructions. Inject 5-8 units under the skin up to three times daily if eating sweets or high carb foods. 30 mL 3  . levothyroxine (SYNTHROID) 200 MCG tablet TAKE 1 TABLET BY MOUTH DAILY MONDAY-SATURDAY  TAKE 1/2 TABLET ON SUNDAY 30  tablet 5  . metoprolol tartrate (LOPRESSOR) 50 MG tablet TAKE ONE AND ONE-HALF TABLET BY MOUTH TWICE A DAY (Patient taking differently: Take 75 mg by mouth 2 (two) times daily. (BETA BLOCKER)) 270 tablet 0  . mirabegron ER (MYRBETRIQ) 25 MG TB24 tablet Take 1 tablet (25 mg total) by mouth daily. 90 tablet 2  . mupirocin ointment (BACTROBAN) 2 % Apply to great toe wound daily, with gauze and tape 30 g 0  .  Semaglutide,0.25 or 0.5MG /DOS, (OZEMPIC, 0.25 OR 0.5 MG/DOSE,) 2 MG/1.5ML SOPN Inject 0.5 mg into the skin once a week. 4.5 mL 1  . TRESIBA FLEXTOUCH 200 UNIT/ML FlexTouch Pen INJECT SUBCUTANEOUSLY 26   UNITS AT BEDTIME 18 mL 1   No facility-administered medications prior to visit.    No Known Allergies  ROS Review of Systems A fourteen system review of systems was performed and found to be positive as per HPI.   Objective:    Physical Exam General:  Well Developed, well nourished, appropriate for stated age.  Neuro:  Alert and oriented,  extra-ocular muscles intact  HEENT:  Normocephalic, atraumatic, neck supple Skin:  no gross rash, warm, pink. Cardiac:  RRR Respiratory:  ECTA B/L, Not using accessory muscles, speaking in full sentences- unlabored. Extremities: Wearing prosthesis of left lower leg Vascular:  Ext warm, no cyanosis apprec.;  No gross edema of right LE Psych:  No HI/SI, judgement and insight good, Euthymic mood. Full Affect.   BP 126/74   Pulse 79   Temp 97.8 F (36.6 C)   SpO2 99%  Wt Readings from Last 3 Encounters:  02/05/21 233 lb (105.7 kg)  12/28/20 237 lb 8 oz (107.7 kg)  12/26/20 235 lb 6.4 oz (106.8 kg)     Health Maintenance Due  Topic Date Due  . Hepatitis C Screening  Never done  . URINE MICROALBUMIN  05/25/2021    There are no preventive care reminders to display for this patient.  Lab Results  Component Value Date   TSH 4.41 03/01/2021   Lab Results  Component Value Date   WBC 4.8 10/16/2020   HGB 15.1 10/16/2020   HCT 45.1  10/16/2020   MCV 91 10/16/2020   PLT 168 10/16/2020   Lab Results  Component Value Date   NA 140 03/01/2021   K 4.6 03/01/2021   CO2 26 03/01/2021   GLUCOSE 127 (H) 03/01/2021   BUN 27 (H) 03/01/2021   CREATININE 1.66 (H) 03/01/2021   BILITOT 0.3 03/01/2021   ALKPHOS 39 03/01/2021   AST 24 03/01/2021   ALT 20 03/01/2021   PROT 6.9 03/01/2021   ALBUMIN 3.9 03/01/2021   CALCIUM 9.9 03/01/2021   ANIONGAP 10 11/08/2020   GFR 39.55 (L) 03/01/2021   Lab Results  Component Value Date   CHOL 158 03/01/2021   Lab Results  Component Value Date   HDL 35.80 (L) 03/01/2021   Lab Results  Component Value Date   LDLCALC 94 03/01/2021   Lab Results  Component Value Date   TRIG 142.0 03/01/2021   Lab Results  Component Value Date   CHOLHDL 4 03/01/2021   Lab Results  Component Value Date   HGBA1C 9.2 (H) 03/01/2021      Assessment & Plan:   Problem List Items Addressed This Visit      Endocrine   Mixed diabetic hyperlipidemia associated with type 2 diabetes mellitus ([place]) - Primary   Type II diabetes mellitus, uncontrolled (HCC)     Genitourinary   Chronic kidney disease     Type II diabetes mellitus, uncontrolled: -Last A1c 9.2 (03/01/21), above goal. -Followed by Endocrinology. Advised patient to follow up as instructed. Continue current medication regimen. Recommend increasing Ozempic to 1 mg once a week as suggested by Dr. Dwyane Dee. Recommend to improve dietary adherence and reduce glucose intake. -Continue ambulatory glucose monitoring. -Will continue to monitor alongside endocrinology.  Mixed diabetic hyperlipidemia associated with type 2 diabetes mellitus: -Last lipid panel: total cholesterol 158,  triglycerides 142, HDL 35, LDL 94 (improved from 145) -Continue choline fenofibrate and atorvastatin. If LDL continue to be above goal of <70 then patient will benefit from increasing atorvastatin to 80 mg. -Will continue to monitor alongside cardiology and  endocrinology.  CKD: -Last CMP: Cr 1.66, GFR 39.55, stable. -Continue to avoid NSAIDs. -Will continue to monitor.   No orders of the defined types were placed in this encounter.   Follow-up: Return in about 4 months (around 07/30/2021) for Medicare Wellness with FBW few days before.   Note:  This note was prepared with assistance of Dragon voice recognition software. Occasional wrong-word or sound-a-like substitutions may have occurred due to the inherent limitations of voice recognition software.  Lorrene Reid, [name]

## 2021-04-12 ENCOUNTER — Ambulatory Visit: Payer: Medicare Other | Admitting: Endocrinology

## 2021-04-18 NOTE — Progress Notes (Signed)
Remote pacemaker transmission.   

## 2021-04-19 ENCOUNTER — Other Ambulatory Visit: Payer: Self-pay | Admitting: *Deleted

## 2021-04-19 NOTE — Patient Outreach (Signed)
[facility]) Care Management  04/19/2021  Steven Maldonado 09-02-1941 [id number]   RN Health Coach attempted follow up outreach call to patient.  Patient was unavailable. HIPPA compliance voicemail message left with return callback number.  Plan: RN will call patient again within 30 days.  [place] [facility] [phone]

## 2021-04-20 ENCOUNTER — Other Ambulatory Visit: Payer: Self-pay | Admitting: *Deleted

## 2021-04-30 ENCOUNTER — Ambulatory Visit: Payer: Medicare Other | Admitting: Endocrinology

## 2021-05-02 ENCOUNTER — Encounter: Payer: Self-pay | Admitting: Endocrinology

## 2021-05-08 ENCOUNTER — Other Ambulatory Visit: Payer: Self-pay

## 2021-05-08 ENCOUNTER — Ambulatory Visit (INDEPENDENT_AMBULATORY_CARE_PROVIDER_SITE_OTHER): Payer: Medicare Other | Admitting: Cardiology

## 2021-05-08 ENCOUNTER — Encounter: Payer: Self-pay | Admitting: Cardiology

## 2021-05-08 VITALS — BP 112/56 | HR 77 | Ht 71.0 in | Wt 240.2 lb

## 2021-05-08 DIAGNOSIS — I4819 Other persistent atrial fibrillation: Secondary | ICD-10-CM | POA: Diagnosis not present

## 2021-05-08 DIAGNOSIS — I2510 Atherosclerotic heart disease of native coronary artery without angina pectoris: Secondary | ICD-10-CM

## 2021-05-08 NOTE — Patient Instructions (Signed)
Medication Instructions:  Your physician recommends that you continue on your current medications as directed. Please refer to the Current Medication list given to you today.  *If you need a refill on your cardiac medications before your next appointment, please call your pharmacy*   Lab Work: None ordered   Testing/Procedures: None ordered   Follow-Up: At [facility], you and your health needs are our priority.  As part of our continuing mission to provide you with exceptional heart care, we have created designated Provider Care Teams.  These Care Teams include your primary Cardiologist (physician) and Advanced Practice Providers (APPs -  Physician Assistants and Nurse Practitioners) who all work together to provide you with the care you need, when you need it.  We recommend signing up for the patient portal called "MyChart".  Sign up information is provided on this After Visit Summary.  MyChart is used to connect with patients for Virtual Visits (Telemedicine).  Patients are able to view lab/test results, encounter notes, upcoming appointments, etc.  Non-urgent messages can be sent to your provider as well.   To learn more about what you can do with MyChart, go to [email or web address].    Remote monitoring is used to monitor your Pacemaker or ICD from home. This monitoring reduces the number of office visits required to check your device to one time per year. It allows [place] to keep an eye on the functioning of your device to ensure it is working properly. You are scheduled for a device check from home on 06/25/2021. You may send your transmission at any time that day. If you have a wireless device, the transmission will be sent automatically. After your physician reviews your transmission, you will receive a postcard with your next transmission date.  Your next appointment:   6 month(s)  The format for your next appointment:   In Person  Provider:   Allegra Lai, MD  Your  physician recommends that you schedule a follow-up appointment in: 3 months with Dr. Angelena Form   Thank you for choosing [facility]!!   Trinidad Curet, RN [phone]

## 2021-05-08 NOTE — Progress Notes (Signed)
He is currently  Electrophysiology Office Note   Date:  05/08/2021   ID:  Steven, Maldonado 27-Jan-1941, MRN [id number]  PCP:  Lorrene Reid, [name]  Cardiologist:  Angelena Form Primary Electrophysiologist:  Constance Haw, MD    No chief complaint on file.    History of Present Illness: Steven Maldonado is a 80 y.o. male who is being seen today for the evaluation of complete AV block at the request of Darlina Guys. Presenting today for electrophysiology evaluation.   He has a history significant for paroxysmal atrial fibrillation, diabetes, hypertension, hyperlipidemia, PAD, coronary artery disease, severe aortic stenosis status post TAVR complicated by complete heart block.  He is status post Medtronic dual-chamber pacemaker.  He is also status post atrial fibrillation ablation 12/10/2019.  Today, denies symptoms of palpitations, chest pain, shortness of breath, orthopnea, PND, lower extremity edema, claudication, dizziness, presyncope, syncope, bleeding, or neurologic sequela. The patient is tolerating medications without difficulties.  Since being seen he has done well.  He has had no chest pain or shortness of breath.  He is able to do all of his daily activities without restriction.  Past Medical History:  Diagnosis Date   Chronic diastolic CHF (congestive heart failure) (HCC)    CKD (chronic kidney disease), stage III (HCC)    Constipation    Coronary artery disease    a. Cath February 2012 in [facility], occluded RCA with collaterals   DM type 2 (diabetes mellitus, type 2) ([place])    Essential hypertension    Hyperlipidemia    Neuropathy    feet   Pacemaker    a. symptomatic brady after TAVR s/p MDT PPM by Dr. Curt Bears 12/04/17   Persistent atrial fibrillation (HCC)    PONV (postoperative nausea and vomiting)    after valve surgery   PVD (peripheral vascular disease) ([place])    a. s/p R popliteal artery stenosis tx with drug-coated balloon 05/2014, followed by Dr. Fletcher Anon.    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 12/02/17: s/p TAVR   Skin cancer    Sleep apnea with use of continuous positive airway pressure (CPAP)    04-11-11 AHI was 32.9 and titrated to 15 cm H20, DME is [facility]   Subclinical hypothyroidism    Past Surgical History:  Procedure Laterality Date   ABDOMINAL ANGIOGRAM N/A 06/08/2014   Procedure: ABDOMINAL ANGIOGRAM;  Surgeon: Wellington Hampshire, MD;  Location: [place] CATH LAB;  Service: Cardiovascular;  Laterality: N/A;   ABDOMINAL AORTOGRAM N/A 04/09/2018   Procedure: ABDOMINAL AORTOGRAM;  Surgeon: Conrad Dawson, MD;  Location: [place] CV LAB;  Service: Cardiovascular;  Laterality: N/A;   ABDOMINAL AORTOGRAM W/LOWER EXTREMITY N/A 08/23/2020   Procedure: ABDOMINAL AORTOGRAM W/LOWER EXTREMITY;  Surgeon: Wellington Hampshire, MD;  Location: [place] CV LAB;  Service: Cardiovascular;  Laterality: N/A;   AMPUTATION Left 04/17/2018   Procedure: LEFT FOOT 3RD RAY AMPUTATION;  Surgeon: Newt Minion, MD;  Location: [place];  Service: Orthopedics;  Laterality: Left;   AMPUTATION Left 05/28/2018   Procedure: LEFT AMPUTATION BELOW KNEE;  Surgeon: Wylene Simmer, MD;  Location: [place];  Service: Orthopedics;  Laterality: Left;   [place] N/A 11/08/2020   Procedure: ATRIAL FIBRILLATION ABLATION;  Surgeon: Constance Haw, MD;  Location: [place] CV LAB;  Service:  Cardiovascular;  Laterality: N/A;   BELOW KNEE LEG AMPUTATION Left 05/28/2018   CARDIAC CATHETERIZATION  12/2010   CARDIOVERSION  07/2011   CARDIOVERSION N/A 04/18/2014   Procedure: CARDIOVERSION;  Surgeon: Dorothy Spark, MD;  Location: [place];  Service: Cardiovascular;  Laterality: N/A;   CARDIOVERSION N/A 11/03/2015   Procedure: CARDIOVERSION;  Surgeon: Lelon Perla, MD;  Location: [facility] ENDOSCOPY;   Service: Cardiovascular;  Laterality: N/A;   CARDIOVERSION N/A 05/08/2017   Procedure: CARDIOVERSION;  Surgeon: Dorothy Spark, MD;  Location: [place];  Service: Cardiovascular;  Laterality: N/A;   CARDIOVERSION N/A 07/28/2017   Procedure: CARDIOVERSION;  Surgeon: Dorothy Spark, MD;  Location: [facility] ENDOSCOPY;  Service: Cardiovascular;  Laterality: N/A;   CARDIOVERSION N/A 02/08/2020   Procedure: CARDIOVERSION;  Surgeon: Buford Dresser, MD;  Location: [facility] ENDOSCOPY;  Service: Cardiovascular;  Laterality: N/A;   CARDIOVERSION N/A 03/15/2020   Procedure: CARDIOVERSION;  Surgeon: Acie Fredrickson Wonda Cheng, MD;  Location: [facility] ENDOSCOPY;  Service: Cardiovascular;  Laterality: N/A;   CARDIOVERSION N/A 09/20/2020   Procedure: CARDIOVERSION;  Surgeon: Werner Lean, MD;  Location: [place];  Service: Cardiovascular;  Laterality: N/A;   LOWER EXTREMITY ANGIOGRAM N/A 06/08/2014   Procedure: LOWER EXTREMITY ANGIOGRAM;  Surgeon: Wellington Hampshire, MD;  Location: [place] CATH LAB;  Service: Cardiovascular;  Laterality: N/A;   LOWER EXTREMITY ANGIOGRAPHY Left 04/09/2018   Procedure: Lower Extremity Angiography;  Surgeon: Conrad Pentress, MD;  Location: [place] CV LAB;  Service: Cardiovascular;  Laterality: Left;   PACEMAKER IMPLANT N/A 12/04/2017   Procedure: PACEMAKER IMPLANT;  Surgeon: Constance Haw, MD;  Location: [place] CV LAB;  Service: Cardiovascular;  Laterality: N/A;   PERIPHERAL VASCULAR ATHERECTOMY  08/23/2020   Procedure: PERIPHERAL VASCULAR ATHERECTOMY;  Surgeon: Wellington Hampshire, MD;  Location: [place] CV LAB;  Service: Cardiovascular;;   PERIPHERAL VASCULAR BALLOON ANGIOPLASTY Left 04/09/2018   Procedure: PERIPHERAL VASCULAR BALLOON ANGIOPLASTY;  Surgeon: Conrad Rodman, MD;  Location: [place] CV LAB;  Service: Cardiovascular;  Laterality: Left;  SFA   POPLITEAL ARTERY ANGIOPLASTY Right 06/08/2014   Archie Endo 06/08/2014   RIGHT/LEFT HEART CATH AND CORONARY ANGIOGRAPHY  N/A 10/08/2017   Procedure: RIGHT/LEFT HEART CATH AND CORONARY ANGIOGRAPHY;  Surgeon: Burnell Blanks, MD;  Location: [place] CV LAB;  Service: Cardiovascular;  Laterality: N/A;   SKIN CANCER EXCISION Bilateral    "have had them cut off back of neck X 2; off left upper arm; right wrist, near right shoulder blade" (06/08/2014)   TEE WITHOUT CARDIOVERSION N/A 12/02/2017   Procedure: TRANSESOPHAGEAL ECHOCARDIOGRAM (TEE);  Surgeon: Burnell Blanks, MD;  Location: [place];  Service: Open Heart Surgery;  Laterality: N/A;   TEMPORARY PACEMAKER N/A 12/04/2017   Procedure: TEMPORARY PACEMAKER;  Surgeon: Leonie Man, MD;  Location: [place] CV LAB;  Service: Cardiovascular;  Laterality: N/A;   TRANSCATHETER AORTIC VALVE REPLACEMENT, TRANSFEMORAL N/A 12/02/2017   Procedure: TRANSCATHETER AORTIC VALVE REPLACEMENT, TRANSFEMORAL;  Surgeon: Burnell Blanks, MD;  Location: [place];  Service: Open Heart Surgery;  Laterality: N/A;  using Edwards Sapien 3 Transcatheter Heart Valve size 23mm     Current Outpatient Medications  Medication Sig Dispense Refill   atorvastatin (LIPITOR) 40 MG tablet Take 1 tablet (40 mg total) by mouth daily. 90 tablet 3   Blood Glucose Monitoring Suppl (FREESTYLE LITE) DEVI 1 each by Does not apply route 2 (two) times daily. 1 each 0   Choline Fenofibrate (FENOFIBRIC ACID) 135 MG CPDR TAKE 1  CAPSULE BY MOUTH EVERY DAY 90 capsule 0   CLICKFINE PEN NEEDLES 32G X 4 MM MISC USE FOUR TIMES DAILY FOR DIABETES 200 each 2   dabigatran (PRADAXA) 150 MG CAPS capsule Take 1 capsule (150 mg total) by mouth every 12 (twelve) hours. 180 capsule 0   docusate sodium (COLACE) 100 MG capsule Take 300 mg by mouth daily.      FREESTYLE LITE test strip 1 EACH BY OTHER ROUTE 4 (FOUR) TIMES DAILY - BEFORE MEALS AND AT BEDTIME. 450 strip 3   furosemide (LASIX) 20 MG tablet Take 1 tablet (20 mg total) by mouth every other day.     glipiZIDE (GLUCOTROL XL) 10 MG 24 hr tablet Take 1  tablet (10 mg total) by mouth daily with breakfast. 90 tablet 1   insulin lispro (HUMALOG KWIKPEN) 100 UNIT/ML KwikPen Inject 5-8 Units into the skin See admin instructions. Inject 5-8 units under the skin up to three times daily if eating sweets or high carb foods. 30 mL 3   levothyroxine (SYNTHROID) 200 MCG tablet TAKE 1 TABLET BY MOUTH DAILY MONDAY-SATURDAY  TAKE 1/2 TABLET ON SUNDAY 30 tablet 5   metoprolol tartrate (LOPRESSOR) 50 MG tablet TAKE ONE AND ONE-HALF TABLET BY MOUTH TWICE A DAY 270 tablet 0   mirabegron ER (MYRBETRIQ) 25 MG TB24 tablet Take 1 tablet (25 mg total) by mouth daily. 90 tablet 2   mupirocin ointment (BACTROBAN) 2 % Apply to great toe wound daily, with gauze and tape 30 g 0   Semaglutide,0.25 or 0.5MG /DOS, (OZEMPIC, 0.25 OR 0.5 MG/DOSE,) 2 MG/1.5ML SOPN Inject 0.5 mg into the skin once a week. 4.5 mL 1   TRESIBA FLEXTOUCH 200 UNIT/ML FlexTouch Pen INJECT SUBCUTANEOUSLY 26   UNITS AT BEDTIME 18 mL 1   No current facility-administered medications for this visit.    Allergies:   Patient has no known allergies.   Social History:  The patient  reports that he quit smoking about 48 years ago. His smoking use included cigarettes. He has a 28.00 pack-year smoking history. He has never used smokeless tobacco. He reports that he does not drink alcohol and does not use drugs.   Family History:  The patient's family history includes Diabetes in his brother, daughter, father, mother, sister, and another family member; Heart Problems in his daughter; Heart attack in his father and mother; Heart failure in his father; Hypertension in his brother, father, mother, and sister; Stroke in his brother.   ROS:  Please see the history of present illness.   Otherwise, review of systems is positive for none.   All other systems are reviewed and negative.   PHYSICAL EXAM: VS:  BP (!) 112/56   Pulse 77   Ht 5\' 11"  (1.803 m)   Wt 240 lb 3.2 oz (109 kg)   SpO2 98%   BMI 33.50 kg/m  , BMI  Body mass index is 33.5 kg/m. GEN: Well nourished, well developed, in no acute distress  HEENT: normal  Neck: no JVD, carotid bruits, or masses Cardiac: RRR; no murmurs, rubs, or gallops,no edema  Respiratory:  clear to auscultation bilaterally, normal work of breathing GI: soft, nontender, nondistended, + BS MS: no deformity or atrophy  Skin: warm and dry, device site well healed Neuro:  Strength and sensation are intact Psych: euthymic mood, full affect  EKG:  EKG is ordered today. Personal review of the ekg ordered shows AV paced, PVCs  Personal review of the device interrogation today. Results in [place]  Recent Labs: 10/16/2020: Hemoglobin 15.1; Platelets 168 03/01/2021: ALT 20; BUN 27; Creatinine, Ser 1.66; Potassium 4.6; Sodium 140; TSH 4.41    Lipid Panel     Component Value Date/Time   CHOL 158 03/01/2021 1338   CHOL 161 01/24/2020 1045   TRIG 142.0 03/01/2021 1338   HDL 35.80 (L) 03/01/2021 1338   HDL 34 (L) 01/24/2020 1045   CHOLHDL 4 03/01/2021 1338   VLDL 28.4 03/01/2021 1338   LDLCALC 94 03/01/2021 1338   LDLCALC 106 (H) 01/24/2020 1045   LDLCALC 142 (H) 05/31/2019 1246   LDLDIRECT 123.0 12/03/2019 1136     Wt Readings from Last 3 Encounters:  05/08/21 240 lb 3.2 oz (109 kg)  02/05/21 233 lb (105.7 kg)  12/28/20 237 lb 8 oz (107.7 kg)      Other studies Reviewed: Additional studies/ records that were reviewed today include: TTE 12/24/17  Review of the above records today demonstrates:  - Left ventricle: Inferobasal hypokinesis The cavity size was   normal. Wall thickness was increased in a pattern of severe LVH.   Systolic function was normal. The estimated ejection fraction was   in the range of 55% to 60%. - Aortic valve: Post TAVR with no significant peri valvualr   regurgitation gradients have increased since 12/03/17. - Mitral valve: Severely calcified annulus. Moderately thickened,   moderately calcified leaflets . There was mild  regurgitation. - Left atrium: The atrium was moderately dilated. - Atrial septum: No defect or patent foramen ovale was identified.   ASSESSMENT AND PLAN:  1.  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  Device functioning appropriately.  No changes at this time.    2.  Persistent atrial fibrillation: Currently on Pradaxa.  CHA2DS2-VASc of 4.  Status post ablation 11/08/2020.  Amiodarone was stopped at the last visit.  Minimal atrial fibrillation since ablation.  No changes.  Currently well controlled  3.  Hypertension: Currently well controlled  4.  Coronary artery disease: No current chest pain  5.  Hyperlipidemia: Continue statin per primary cardiology  6.  Obstructive sleep apnea: CPAP compliance encouraged   Current medicines are reviewed at length with the patient today.   The patient does not have concerns regarding his medicines.  The following changes were made today: None  Labs/ tests ordered today include:  Orders Placed This Encounter  Procedures   EKG 12-Lead      Disposition:   FU with Daliya Parchment 6 months  Signed, Jacqeline Broers Meredith Leeds, MD  05/08/2021 2:18 PM     [facility] [address] [place] [address] [phone] (office) [phone] (fax)

## 2021-05-10 NOTE — Patient Outreach (Signed)
[facility]) Care Management  [place]  05/10/2021   Steven Maldonado 20-Nov-1940 [id number]  RN Health Coach telephone call to patient.  [employer] compliance verified. Per patient he has not checked his blood sugar yet. Patient stated it has been a while since he had a hypoglycemic episode. Last episode was 50 and patient felt dizzy. He took his glucose tablets. Patient A1C has increase to 9.2 from 8.7. Per patient wife he had been eating some things he should not. Per patient he does not eat fried foods. Patient has agreed to further outreach calls.   Encounter Medications:  Outpatient Encounter Medications as of 04/20/2021  Medication Sig   atorvastatin (LIPITOR) 40 MG tablet Take 1 tablet (40 mg total) by mouth daily.   Blood Glucose Monitoring Suppl (FREESTYLE LITE) DEVI 1 each by Does not apply route 2 (two) times daily.   Choline Fenofibrate (FENOFIBRIC ACID) 135 MG CPDR TAKE 1 CAPSULE BY MOUTH EVERY DAY   CLICKFINE PEN NEEDLES 32G X 4 MM MISC USE FOUR TIMES DAILY FOR DIABETES   dabigatran (PRADAXA) 150 MG CAPS capsule Take 1 capsule (150 mg total) by mouth every 12 (twelve) hours.   docusate sodium (COLACE) 100 MG capsule Take 300 mg by mouth daily.    FREESTYLE LITE test strip 1 EACH BY OTHER ROUTE 4 (FOUR) TIMES DAILY - BEFORE MEALS AND AT BEDTIME.   furosemide (LASIX) 20 MG tablet Take 1 tablet (20 mg total) by mouth every other day.   glipiZIDE (GLUCOTROL XL) 10 MG 24 hr tablet Take 1 tablet (10 mg total) by mouth daily with breakfast.   insulin lispro (HUMALOG KWIKPEN) 100 UNIT/ML KwikPen Inject 5-8 Units into the skin See admin instructions. Inject 5-8 units under the skin up to three times daily if eating sweets or high carb foods.   levothyroxine (SYNTHROID) 200 MCG tablet TAKE 1 TABLET BY MOUTH DAILY MONDAY-SATURDAY  TAKE 1/2 TABLET ON SUNDAY   metoprolol tartrate (LOPRESSOR) 50 MG tablet TAKE ONE AND ONE-HALF TABLET BY MOUTH TWICE A DAY   mirabegron ER  (MYRBETRIQ) 25 MG TB24 tablet Take 1 tablet (25 mg total) by mouth daily.   mupirocin ointment (BACTROBAN) 2 % Apply to great toe wound daily, with gauze and tape   Semaglutide,0.25 or 0.5MG /DOS, (OZEMPIC, 0.25 OR 0.5 MG/DOSE,) 2 MG/1.5ML SOPN Inject 0.5 mg into the skin once a week.   TRESIBA FLEXTOUCH 200 UNIT/ML FlexTouch Pen INJECT SUBCUTANEOUSLY 26   UNITS AT BEDTIME   No facility-administered encounter medications on file as of 04/20/2021.    Functional Status:  In your present state of health, do you have any difficulty performing the following activities: 03/29/2021 12/28/2020  Hearing? N N  Vision? N N  Difficulty concentrating or making decisions? N N  Walking or climbing stairs? N N  Dressing or bathing? N N  Doing errands, shopping? N N  Some recent data might be hidden    Fall/Depression Screening: Fall Risk  04/20/2021 03/29/2021 12/28/2020  Falls in the past year? 1 0 0  Number falls in past yr: 1 0 -  Injury with Fall? 0 0 -  Risk for fall due to : History of fall(s);Impaired balance/gait;Impaired mobility No Fall Risks -  Follow up Falls evaluation completed;Falls prevention discussed;Education provided Falls evaluation completed Falls evaluation completed   PHQ 2/9 Scores 04/20/2021 03/29/2021 12/28/2020 09/26/2020 05/25/2020 01/24/2020 01/06/2020  PHQ - 2 Score 0 0 0 0 0 0 0  PHQ- 9 Score - 0  0 0 0 0 -    Assessment:   Care Plan Care Plan : Diabetes Type 2 (Adult)  Updates made by Verlin Grills, RN since 05/10/2021 12:00 AM     Problem: Glycemic Management (Diabetes, Type 2)   Priority: High  Onset Date: 09/25/2020     Long-Range Goal: Glycemic Management Optimized   Start Date: 09/25/2020  Expected End Date: 11/09/2021  This Visit's Progress: On track  Recent Progress: On track  Priority: High  Note:   Evidence-based guidance:  Anticipate A1C testing (point-of-care) every 3 to 6 months based on goal attainment.  Review mutually-set A1C goal or target  range.  Anticipate use of antihyperglycemic with or without insulin and periodic adjustments; consider active involvement of pharmacist.  Provide medical nutrition therapy and development of individualized eating.  Compare self-reported symptoms of hypo or hyperglycemia to blood glucose levels, diet and fluid intake, current medications, psychosocial and physiologic stressors, change in activity and barriers to care adherence.  Promote self-monitoring of blood glucose levels.  Assess and address barriers to management plan, such as food insecurity, age, developmental ability, depression, anxiety, fear of hypoglycemia or weight gain, as well as medication cost, side effects and complicated regimen.  Consider referral to community-based diabetes education program, visiting nurse, community health worker or health coach.  Encourage regular dental care for treatment of periodontal disease; refer to dental provider when needed.   Notes:     Task: Alleviate Barriers to Glycemic Management   Due Date: 11/09/2021  Note:   Care Management Activities:    - barriers to adherence to treatment plan identified - blood glucose monitoring encouraged - mutual A1C goal set or reviewed - use of blood glucose monitoring log promoted    Notes:     Problem: Disease Progression (Diabetes, Type 2)   Priority: Medium  Onset Date: 09/25/2020     Long-Range Goal: Disease Progression Prevented or Minimized   Start Date: 09/25/2020  Expected End Date: 11/09/2021  This Visit's Progress: On track  Recent Progress: On track  Priority: Medium  Note:   Evidence-based guidance:  Prepare patient for laboratory and diagnostic exams based on risk and presentation.  Encourage lifestyle changes, such as increased intake of plant-based foods, stress reduction, consistent physical activity and smoking cessation to prevent long-term complications and chronic disease.   Individualize activity and exercise recommendations  while considering potential limitations, such as neuropathy, retinopathy or the ability to prevent hyperglycemia or hypoglycemia.   Prepare patient for use of pharmacologic therapy that may include antihypertensive, analgesic, prostaglandin E1 with periodic adjustments, based on presenting chronic condition and laboratory results.  Assess signs/symptoms and risk factors for hypertension, sleep-disordered breathing, neuropathy (including changes in gait and balance), retinopathy, nephropathy and sexual dysfunction.  Address pregnancy planning and contraceptive choice, especially when prescribing antihypertensive or statin.  Ensure completion of annual comprehensive foot exam and dilated eye exam.   Implement additional individualized goals and interventions based on identified risk factors.  Prepare patient for consultation or referral for specialist care, such as ophthalmology, neurology, cardiology, podiatry, nephrology or perinatology.   Notes:     Task: Monitor and Manage Follow-up for Comorbidities   Due Date: 11/09/2021  Note:   Care Management Activities:    - activity based on tolerance and functional limitations encouraged - healthy lifestyle promoted - reduction of sedentary activity encouraged - signs/symptoms of comorbidities identified    Notes:       Goals Addressed  This Visit's Progress    (THN)Make and Keep All Appointments   On track    Timeframe:  Long-Range Goal Priority:  Medium Start Date:    [id number]                         Expected End Date:      [id number]                Follow Up Date [id number]   - call to cancel if needed - keep a calendar with prescription refill dates - keep a calendar with appointment dates    Why is this important?   Part of staying healthy is seeing the doctor for follow-up care.  If you forget your appointments, there are some things you can do to stay on track.    Notes:       (THN)Monitor and Manage My Blood  Sugar   On track    Timeframe:  Long-Range Goal Priority:  High Start Date:   [id number]                          Expected End Date:    70263785                  Follow Up Date 88502774   - check blood sugar at prescribed times - check blood sugar if I feel it is too high or too low - enter blood sugar readings and medication or insulin into daily log - take the blood sugar log to all doctor visits - take the blood sugar meter to all doctor visits    Why is this important?   Checking your blood sugar at home helps to keep it from getting very high or very low.  Writing the results in a diary or log helps the doctor know how to care for you.  Your blood sugar log should have the time, date and the results.  Also, write down the amount of insulin or other medicine that you take.  Other information, like what you ate, exercise done and how you were feeling, will also be helpful.     Notes:  [id number] Patient is monitoring blood sugars with a free style libre      [facility] Eye Exam   On track    Timeframe:  Long-Range Goal Priority:  Medium Start Date:        [id number]                     Expected End Date:   [id number]                   Follow Up Date [id number]   - keep appointment with eye doctor - schedule appointment with eye doctor    Why is this important?   Eye check-ups are important when you have diabetes.  Vision loss can be prevented.    Notes:  [id number] Eye exam [id number]      [facility]   On track    Timeframe:  Long-Range Goal Priority:  Medium Start Date:   49449675                          Expected End Date:   91638466                   Follow Up Date [id number]  -  check feet daily for cuts, sores or redness - keep feet up while sitting - trim toenails straight across - wash and dry feet carefully every day - wear comfortable, cotton socks - wear comfortable, well-fitting shoes    Why is this important?   Good foot care is very important when  you have diabetes.  There are many things you can do to keep your feet healthy and catch a problem early.    Notes:  [id number] Last Podiatry visit was [id number]      (THN)Set My Target A1C   Not on track    Timeframe:  Long-Range Goal Priority:  High Start Date:      76394320                       Expected End Date:     03794446                 Follow Up Date 19012224   - set target A1C    Why is this important?   Your target A1C is decided together by you and your doctor.  It is based on several things like your age and other health issues.    Notes:  A1C target 7.0 [id number] A1C 8.7 down from 8.8 [id number]  Patient is not on track with Patient A1C increased to  9.2         Plan:  Follow-up: Patient agrees to Care Plan and Follow-up. Patient will adhere to diet to decrease A1C Patient will follow up scheduling eye exam RN will follow up with outreach within the month of September RN sent PCP update assessment RN ordered free COVID testing [facility] [facility] [phone]

## 2021-05-10 NOTE — Patient Instructions (Signed)
Goals Addressed             This Visit's Progress    (THN)Make and Keep All Appointments   On track    Timeframe:  Long-Range Goal Priority:  Medium Start Date:    58099833                         Expected End Date:      [id number]                Follow Up Date [id number]   - call to cancel if needed - keep a calendar with prescription refill dates - keep a calendar with appointment dates    Why is this important?   Part of staying healthy is seeing the doctor for follow-up care.  If you forget your appointments, there are some things you can do to stay on track.    Notes:       (THN)Monitor and Manage My Blood Sugar   On track    Timeframe:  Long-Range Goal Priority:  High Start Date:   [id number]                          Expected End Date:    [id number]                  Follow Up Date [id number]   - check blood sugar at prescribed times - check blood sugar if I feel it is too high or too low - enter blood sugar readings and medication or insulin into daily log - take the blood sugar log to all doctor visits - take the blood sugar meter to all doctor visits    Why is this important?   Checking your blood sugar at home helps to keep it from getting very high or very low.  Writing the results in a diary or log helps the doctor know how to care for you.  Your blood sugar log should have the time, date and the results.  Also, write down the amount of insulin or other medicine that you take.  Other information, like what you ate, exercise done and how you were feeling, will also be helpful.     Notes:  [id number] Patient is monitoring blood sugars with a free style libre      [facility] Eye Exam   On track    Timeframe:  Long-Range Goal Priority:  Medium Start Date:        11941740                     Expected End Date:   [id number]                   Follow Up Date [id number]   - keep appointment with eye doctor - schedule appointment with eye doctor    Why is this important?    Eye check-ups are important when you have diabetes.  Vision loss can be prevented.    Notes:  [id number] Eye exam [id number]      [facility]   On track    Timeframe:  Long-Range Goal Priority:  Medium Start Date:   86767209                          Expected End Date:   47096283  Follow Up Date [id number]  - check feet daily for cuts, sores or redness - keep feet up while sitting - trim toenails straight across - wash and dry feet carefully every day - wear comfortable, cotton socks - wear comfortable, well-fitting shoes    Why is this important?   Good foot care is very important when you have diabetes.  There are many things you can do to keep your feet healthy and catch a problem early.    Notes:  [id number] Last Podiatry visit was [id number]      (THN)Set My Target A1C   Not on track    Timeframe:  Long-Range Goal Priority:  High Start Date:      11886773                       Expected End Date:     73668159                 Follow Up Date 47076151   - set target A1C    Why is this important?   Your target A1C is decided together by you and your doctor.  It is based on several things like your age and other health issues.    Notes:  A1C target 7.0 [id number] A1C 8.7 down from 8.8 [id number]  Patient is not on track with Patient A1C increased to  9.2

## 2021-05-15 ENCOUNTER — Other Ambulatory Visit: Payer: Self-pay

## 2021-05-15 ENCOUNTER — Ambulatory Visit (INDEPENDENT_AMBULATORY_CARE_PROVIDER_SITE_OTHER): Payer: Medicare Other | Admitting: Endocrinology

## 2021-05-15 VITALS — BP 130/78 | HR 93 | Ht 71.0 in | Wt 239.8 lb

## 2021-05-15 DIAGNOSIS — E039 Hypothyroidism, unspecified: Secondary | ICD-10-CM

## 2021-05-15 DIAGNOSIS — Z794 Long term (current) use of insulin: Secondary | ICD-10-CM

## 2021-05-15 DIAGNOSIS — E782 Mixed hyperlipidemia: Secondary | ICD-10-CM | POA: Diagnosis not present

## 2021-05-15 DIAGNOSIS — I2510 Atherosclerotic heart disease of native coronary artery without angina pectoris: Secondary | ICD-10-CM

## 2021-05-15 DIAGNOSIS — E1165 Type 2 diabetes mellitus with hyperglycemia: Secondary | ICD-10-CM

## 2021-05-15 LAB — POCT GLYCOSYLATED HEMOGLOBIN (HGB A1C): Hemoglobin A1C: 7.1 % — AB (ref 4.0–5.6)

## 2021-05-15 MED ORDER — EZETIMIBE 10 MG PO TABS: 10.0000 mg | ORAL_TABLET | Freq: Every day | ORAL | 3 refills | Status: DC

## 2021-05-15 NOTE — Progress Notes (Signed)
Patient ID: Steven Maldonado, male   DOB: 01-Feb-1941, 80 y.o.   MRN: [id number]          Reason for Appointment: Endocrinology follow-up     History of Present Illness:     DIABETES:       Date of diagnosis of type 2 diabetes mellitus:  1999      Background history:   He had been treated with metformin and glipizide for several years Metformin was stopped about 4 years ago because of renal dysfunction Presumably because of poor control he was started on insulin around the year 2009 He had been mostly on Lantus which was subsequently changed to [employer] and he thinks that Lantus worked better Also has been on Humalog 3-4 times a day for some time  Recent history:    INSULIN regimen is:  TRESIBA 16 at bedtime, Humalog 5-8 units 3 times a day,   Non-insulin hypoglycemic drugs the patient is taking are: on Ozempic 0.5 mg weekly, glipizide ER 10 mg daily  Most recent A1c is 11.1 compared to 9.2  Current management, blood sugar patterns and problems identified:  He was not able to come for office visit after his labs in April but since his last visit he has apparently taken his Ozempic more regularly now No side effects with his and he thinks it is affordable with mail order prescription Overall blood sugars are much better compared to his last visit but data is somewhat incomplete especially in the last week Blood sugars are within the target range 74% of the time compared to 70% previously Likely however he has less consistent hyperglycemia lately with Ozempic Overall postprandial readings are better controlled compared to before He is concerned about hypoglycemia but this is mostly when he takes 8 units of Humalog at breakfast when blood sugars are recently mostly high For breakfast he will either eat cereal or toast with eggs and bacon He is likely getting adequate coverage for lunch and dinner His weight may have gone up since March   Side effects from medications have been:  None     Meal times are:  Breakfast is at 8 AM, dinnertime 5-6 PM  Interpretation and data from freestyle libre version 2 for the last 2 weeks  He thinks that his Elenor Legato is relatively accurate compared to the fingerstick readings Summary of patterns: The data is incomplete with usually inadequate monitoring after dinner especially in the first week and also occasionally not checking blood sugars more than once a day  Overall blood sugars are HIGHEST late morning averaging 174 but occasionally has persistently high readings including overnight Lowest blood sugars on an average are around 12 AM-2 AM  Postprandial hyperglycemia may be present only occasionally after lunch although diarrhea after dinner is somewhat incomplete Blood sugars may sometimes tend to drop between breakfast and lunch including at least 2 episodes of hypoglycemia  Hypoglycemia documented generally in the early afternoon preceded by high sugars Overnight blood sugars are higher than the last week compared to the week before  CGM use % of time 46  2-week average/GV 149  Time in range    74    %  % Time Above 180 22  % Time above 250   % Time Below 70 4     PRE-MEAL Fasting Lunch Dinner Bedtime Overall  Glucose range:       Averages: 171 124 166     POST-MEAL PC Breakfast PC Lunch PC Dinner  Glucose  range:     Averages: 174 153 152   Previously:  CGM use % of time  47  2-week average/GV    Time in range    70    %  % Time Above 180  27  % Time above 250   % Time Below 70 3     PRE-MEAL Fasting Lunch Dinner Bedtime Overall  Glucose range:       Averages:  105  123  172  175    POST-MEAL PC Breakfast PC Lunch PC Dinner  Glucose range:     Averages: ?  193  180    Dietician visit, most recent: 9/19  Weight history:  Wt Readings from Last 3 Encounters:  05/15/21 239 lb 12.8 oz (108.8 kg)  05/08/21 240 lb 3.2 oz (109 kg)  02/05/21 233 lb (105.7 kg)    Glycemic control:   Lab Results   Component Value Date   HGBA1C 7.1 (A) 05/15/2021   HGBA1C 9.2 (H) 03/01/2021   HGBA1C 8.7 (H) 11/30/2020   Lab Results  Component Value Date   MICROALBUR 10 05/25/2020   LDLCALC 94 03/01/2021   CREATININE 1.66 (H) 03/01/2021   Lab Results  Component Value Date   MICRALBCREAT <30 05/25/2020    Lab Results  Component Value Date   FRUCTOSAMINE 268 06/26/2018   HYPOTHYROIDISM and other problems: See review of systems   Office Visit on 05/15/2021  Component Date Value Ref Range Status   Hemoglobin A1C 05/15/2021 7.1 (A) 4.0 - 5.6 % Final    Allergies as of 05/15/2021   No Known Allergies      Medication List        Accurate as of May 15, 2021 12:11 PM. If you have any questions, ask your nurse or doctor.          atorvastatin 40 MG tablet Commonly known as: LIPITOR Take 1 tablet (40 mg total) by mouth daily.   Clickfine Pen Needles 32G X 4 MM Misc Generic drug: Insulin Pen Needle USE FOUR TIMES DAILY FOR DIABETES   dabigatran 150 MG Caps capsule Commonly known as: Pradaxa Take 1 capsule (150 mg total) by mouth every 12 (twelve) hours.   docusate sodium 100 MG capsule Commonly known as: COLACE Take 300 mg by mouth daily.   Fenofibric Acid 135 MG Cpdr TAKE 1 CAPSULE BY MOUTH EVERY DAY   FreeStyle Lite Devi 1 each by Does not apply route 2 (two) times daily.   FREESTYLE LITE test strip Generic drug: glucose blood 1 EACH BY OTHER ROUTE 4 (FOUR) TIMES DAILY - BEFORE MEALS AND AT BEDTIME.   furosemide 20 MG tablet Commonly known as: LASIX Take 1 tablet (20 mg total) by mouth every other day.   glipiZIDE 10 MG 24 hr tablet Commonly known as: GLUCOTROL XL Take 1 tablet (10 mg total) by mouth daily with breakfast.   insulin lispro 100 UNIT/ML KwikPen Commonly known as: HumaLOG KwikPen Inject 5-8 Units into the skin See admin instructions. Inject 5-8 units under the skin up to three times daily if eating sweets or high carb foods.   levothyroxine 200  MCG tablet Commonly known as: SYNTHROID TAKE 1 TABLET BY MOUTH DAILY MONDAY-SATURDAY  TAKE 1/2 TABLET ON SUNDAY   metoprolol tartrate 50 MG tablet Commonly known as: LOPRESSOR TAKE ONE AND ONE-HALF TABLET BY MOUTH TWICE A DAY   mirabegron ER 25 MG Tb24 tablet Commonly known as: MYRBETRIQ Take 1 tablet (25 mg total) by mouth  daily.   mupirocin ointment 2 % Commonly known as: BACTROBAN Apply to great toe wound daily, with gauze and tape   Ozempic (0.25 or 0.5 MG/DOSE) 2 MG/1.5ML Sopn Generic drug: Semaglutide(0.25 or 0.5MG /DOS) Inject 0.5 mg into the skin once a week.   [name] FlexTouch 200 UNIT/ML FlexTouch Pen Generic drug: insulin degludec INJECT SUBCUTANEOUSLY 26   UNITS AT BEDTIME        Allergies: No Known Allergies  Past Medical History:  Diagnosis Date   Chronic diastolic CHF (congestive heart failure) (HCC)    CKD (chronic kidney disease), stage III (HCC)    Constipation    Coronary artery disease    a. Cath February 2012 in [place] [facility], occluded RCA with collaterals   DM type 2 (diabetes mellitus, type 2) ([place])    Essential hypertension    Hyperlipidemia    Neuropathy    feet   Pacemaker    a. symptomatic brady after TAVR s/p MDT PPM by Dr. Curt Bears 12/04/17   Persistent atrial fibrillation (HCC)    PONV (postoperative nausea and vomiting)    after valve surgery   PVD (peripheral vascular disease) ([place])    a. s/p R popliteal artery stenosis tx with drug-coated balloon 05/2014, followed by Dr. Fletcher Anon.   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 12/02/17: s/p TAVR   Skin cancer    Sleep apnea with use of continuous positive airway pressure (CPAP)    04-11-11 AHI was 32.9 and titrated to 15 cm H20, DME is [facility]   Subclinical hypothyroidism     Past Surgical History:  Procedure Laterality Date   ABDOMINAL  ANGIOGRAM N/A 06/08/2014   Procedure: ABDOMINAL ANGIOGRAM;  Surgeon: Wellington Hampshire, MD;  Location: [facility] CATH LAB;  Service: Cardiovascular;  Laterality: N/A;   ABDOMINAL AORTOGRAM N/A 04/09/2018   Procedure: ABDOMINAL AORTOGRAM;  Surgeon: Conrad Pickens, MD;  Location: [place] [facility];  Service: Cardiovascular;  Laterality: N/A;   ABDOMINAL AORTOGRAM W/LOWER EXTREMITY N/A 08/23/2020   Procedure: ABDOMINAL AORTOGRAM W/LOWER EXTREMITY;  Surgeon: Wellington Hampshire, MD;  Location: [place] [facility];  Service: Cardiovascular;  Laterality: N/A;   AMPUTATION Left 04/17/2018   Procedure: LEFT FOOT 3RD RAY AMPUTATION;  Surgeon: Newt Minion, MD;  Location: [place];  Service: Orthopedics;  Laterality: Left;   AMPUTATION Left 05/28/2018   Procedure: LEFT AMPUTATION BELOW KNEE;  Surgeon: Wylene Simmer, MD;  Location: [place];  Service: Orthopedics;  Laterality: Left;   [place] N/A 11/08/2020   Procedure: ATRIAL FIBRILLATION ABLATION;  Surgeon: Constance Haw, MD;  Location: [place] [facility];  Service: Cardiovascular;  Laterality: N/A;   BELOW KNEE LEG AMPUTATION Left 05/28/2018   CARDIAC CATHETERIZATION  12/2010   CARDIOVERSION  07/2011   CARDIOVERSION N/A 04/18/2014   Procedure: CARDIOVERSION;  Surgeon: Dorothy Spark, MD;  Location: [place];  Service: Cardiovascular;  Laterality: N/A;   CARDIOVERSION N/A 11/03/2015   Procedure: CARDIOVERSION;  Surgeon: Lelon Perla, MD;  Location: [facility] ENDOSCOPY;  Service: Cardiovascular;  Laterality: N/A;   CARDIOVERSION N/A 05/08/2017   Procedure: CARDIOVERSION;  Surgeon: Dorothy Spark, MD;  Location: [facility] ENDOSCOPY;  Service: Cardiovascular;  Laterality: N/A;   CARDIOVERSION N/A 07/28/2017   Procedure: CARDIOVERSION;  Surgeon: Dorothy Spark, MD;  Location: [place];  Service:  Cardiovascular;  Laterality: N/A;   CARDIOVERSION N/A 02/08/2020   Procedure: CARDIOVERSION;  Surgeon: Buford Dresser, MD;   Location: [facility] ENDOSCOPY;  Service: Cardiovascular;  Laterality: N/A;   CARDIOVERSION N/A 03/15/2020   Procedure: CARDIOVERSION;  Surgeon: Thayer Headings, MD;  Location: [facility] ENDOSCOPY;  Service: Cardiovascular;  Laterality: N/A;   CARDIOVERSION N/A 09/20/2020   Procedure: CARDIOVERSION;  Surgeon: Werner Lean, MD;  Location: [place];  Service: Cardiovascular;  Laterality: N/A;   LOWER EXTREMITY ANGIOGRAM N/A 06/08/2014   Procedure: LOWER EXTREMITY ANGIOGRAM;  Surgeon: Wellington Hampshire, MD;  Location: [place] CATH LAB;  Service: Cardiovascular;  Laterality: N/A;   LOWER EXTREMITY ANGIOGRAPHY Left 04/09/2018   Procedure: Lower Extremity Angiography;  Surgeon: Conrad Walcott, MD;  Location: [place] CV LAB;  Service: Cardiovascular;  Laterality: Left;   PACEMAKER IMPLANT N/A 12/04/2017   Procedure: PACEMAKER IMPLANT;  Surgeon: Constance Haw, MD;  Location: [place] CV LAB;  Service: Cardiovascular;  Laterality: N/A;   PERIPHERAL VASCULAR ATHERECTOMY  08/23/2020   Procedure: PERIPHERAL VASCULAR ATHERECTOMY;  Surgeon: Wellington Hampshire, MD;  Location: [place] CV LAB;  Service: Cardiovascular;;   PERIPHERAL VASCULAR BALLOON ANGIOPLASTY Left 04/09/2018   Procedure: PERIPHERAL VASCULAR BALLOON ANGIOPLASTY;  Surgeon: Conrad Saratoga, MD;  Location: [place] [facility] LAB;  Service: Cardiovascular;  Laterality: Left;  SFA   POPLITEAL ARTERY ANGIOPLASTY Right 06/08/2014   Archie Endo 06/08/2014   RIGHT/LEFT HEART CATH AND CORONARY ANGIOGRAPHY N/A 10/08/2017   Procedure: RIGHT/LEFT HEART CATH AND CORONARY ANGIOGRAPHY;  Surgeon: Burnell Blanks, MD;  Location: [place] CV LAB;  Service: Cardiovascular;  Laterality: N/A;   SKIN CANCER EXCISION Bilateral    "have had them cut off back of neck X 2; off left upper arm; right wrist, near right shoulder blade" (06/08/2014)   TEE WITHOUT CARDIOVERSION N/A 12/02/2017   Procedure: TRANSESOPHAGEAL ECHOCARDIOGRAM (TEE);  Surgeon: Burnell Blanks, MD;  Location: [place];  Service: Open Heart Surgery;  Laterality: N/A;   TEMPORARY PACEMAKER N/A 12/04/2017   Procedure: TEMPORARY PACEMAKER;  Surgeon: Leonie Man, MD;  Location: [place] CV LAB;  Service: Cardiovascular;  Laterality: N/A;   TRANSCATHETER AORTIC VALVE REPLACEMENT, TRANSFEMORAL N/A 12/02/2017   Procedure: TRANSCATHETER AORTIC VALVE REPLACEMENT, TRANSFEMORAL;  Surgeon: Burnell Blanks, MD;  Location: [place];  Service: Open Heart Surgery;  Laterality: N/A;  using Edwards Sapien 3 Transcatheter Heart Valve size 66mm    Family History  Problem Relation Age of Onset   Diabetes Mother    Heart attack Mother    Hypertension Mother    Heart attack Father    Heart failure Father    Hypertension Father    Diabetes Father    Diabetes Sister    Diabetes Brother    Diabetes Other    Diabetes Daughter        TYPE ll   Heart Problems Daughter    Hypertension Sister    Hypertension Brother    Stroke Brother     Social History:  reports that he quit smoking about 48 years ago. His smoking use included cigarettes. He has a 28.00 pack-year smoking history. He has never used smokeless tobacco. He reports that he does not drink alcohol and does not use drugs.   Review of Systems   Lipid history: He is supposed to be on 40  mg atorvastatin and ezetimibe but he does not have the Zetia on his prescription list Triglycerides are better with fenofibrate but LDL is  still over 70  He has history of CAD    Lab Results  Component Value Date   CHOL 158 03/01/2021   CHOL 209 (H) 11/30/2020   CHOL 161 01/24/2020   Lab Results  Component Value Date   HDL 35.80 (L) 03/01/2021   HDL 38.20 (L) 11/30/2020   HDL 34 (L) 01/24/2020   Lab Results  Component Value Date   LDLCALC 94 03/01/2021   LDLCALC 145 (H) 11/30/2020   LDLCALC 106 (H) 01/24/2020   Lab Results  Component Value Date   TRIG 142.0 03/01/2021   TRIG 133.0 11/30/2020   TRIG 113 01/24/2020   Lab  Results  Component Value Date   CHOLHDL 4 03/01/2021   CHOLHDL 5 11/30/2020   CHOLHDL 4.7 01/24/2020   Lab Results  Component Value Date   LDLDIRECT 123.0 12/03/2019   LDLDIRECT 77.0 08/18/2018            Hypertension: Has been controlled with only antihypertensive being metoprolol, not on ACE inhibitor  BP Readings from Last 3 Encounters:  05/15/21 130/78  05/08/21 (!) 112/56  03/29/21 126/74    Most recent eye exam was in 12/18 ?  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 History of symptomatic neuropathy  He has chronic kidney disease of unclear etiology, is followed by nephrologist periodically   Lab Results  Component Value Date   CREATININE 1.66 (H) 03/01/2021   CREATININE 1.85 (H) 11/30/2020   CREATININE 1.83 (H) 11/08/2020       Physical Examination:  BP 130/78   Pulse 93   Ht 5\' 11"  (1.803 m)   Wt 239 lb 12.8 oz (108.8 kg)   SpO2 97%   BMI 33.45 kg/m          ASSESSMENT:  Diabetes type 2, insulin requiring with moderate obesity  See history of present illness for detailed discussion of current diabetes management, blood sugar patterns and problems identified  Currently on a regimen of basal bolus insulin with Ozempic 0.5 mg  A1c is surprisingly better at 7.1  Blood sugars overall are improving likely today from consistently using Ozempic Previously had difficulty adjusting his mealtime dose and getting consistent postprandial control However now more recently appears to be getting mostly high readings overnight and highest readings on an average  are before and after breakfast His diet especially at breakfast was discussed in detail and advised him on the best options instead of waiting higher carbohydrate meals like cereal or adding extra high fat foods like bacon  Blood sugar patterns from his [employer] were reviewed Presents for high and low sugars discussed Tolerating Ozempic  Primary hypothyroidism longstanding has been on 200 mcg levothyroxine, 6-1/2 days a week TSH is most recently controlled  LIPIDS: Although his LDL is better his target is 5 Unlikely to be achieved with   PLAN:   Continue 0.5 mg Ozempic weekly He should not take more than 5 units of Humalog in the morning Also may need to skip his Humalog if eating a very light or low carbohydrate meal later  in the day Needs to check blood sugars consistently after meals especially after dinner At least go up 2 units on the Tresiba May need to go up to 20 units after a week if blood sugars in the mornings are still more than 150 Written instructions given  Start Zetia 10 mg daily which had been previously prescribed also and continue atorvastatin and fenofibrate as before   Patient Instructions  Check blood sugars on waking up 7 days a week  Also check blood sugars about 2 hours after meals and do this after different meals by rotation  Recommended blood sugar levels on waking up are 90-130 and about 2 hours after meal is 130-180  Please bring your blood sugar monitor to each visit, thank you  Tresiba 18 units at night and go up 2 more if am sugar stays > 150 in am  Add protein to breakfast   MAX DOSE HUMALOG 5 UNITS  IN AM     Elayne Snare 05/15/2021, 12:11 PM   Note: This office note was prepared with Dragon voice recognition system technology. Any transcriptional errors that result from this process are unintentional.

## 2021-05-15 NOTE — Patient Instructions (Addendum)
Check blood sugars on waking up 7 days a week  Also check blood sugars about 2 hours after meals and do this after different meals by rotation  Recommended blood sugar levels on waking up are 90-130 and about 2 hours after meal is 130-180  Please bring your blood sugar monitor to each visit, thank you  Tresiba 18 units at night and go up 2 more if am sugar stays > 150 in am  Add protein to breakfast   MAX DOSE HUMALOG 5 UNITS  IN AM

## 2021-05-16 DIAGNOSIS — Z20822 Contact with and (suspected) exposure to covid-19: Secondary | ICD-10-CM | POA: Diagnosis not present

## 2021-05-21 ENCOUNTER — Ambulatory Visit: Payer: Self-pay | Admitting: *Deleted

## 2021-05-30 ENCOUNTER — Telehealth: Payer: Self-pay | Admitting: Cardiovascular Disease

## 2021-05-30 ENCOUNTER — Other Ambulatory Visit: Payer: Self-pay | Admitting: Physician Assistant

## 2021-05-30 MED ORDER — DABIGATRAN ETEXILATE MESYLATE 150 MG PO CAPS: 150.0000 mg | ORAL_CAPSULE | Freq: Two times a day (BID) | ORAL | 1 refills | Status: DC

## 2021-05-30 NOTE — Telephone Encounter (Signed)
Hx: afib Scr: 1.66, 03/01/2021 Weight:108.8 kg  Age:79yo CrCl: 48 ml/min

## 2021-05-30 NOTE — Telephone Encounter (Signed)
*  STAT* If patient is at the pharmacy, call can be transferred to refill team.   1. Which medications need to be refilled? (please list name of each medication and dose if known) dabigatran (PRADAXA) 150 MG CAPS capsule  2. Which pharmacy/location (including street and city if local pharmacy) is medication to be sent to? [facility], [place] AT Portal to Registered [employer] Sites  3. Do they need a 30 day or 90 day supply? 90 day

## 2021-05-31 ENCOUNTER — Ambulatory Visit (INDEPENDENT_AMBULATORY_CARE_PROVIDER_SITE_OTHER): Payer: Medicare Other | Admitting: Podiatry

## 2021-05-31 ENCOUNTER — Other Ambulatory Visit: Payer: Self-pay

## 2021-05-31 DIAGNOSIS — B351 Tinea unguium: Secondary | ICD-10-CM | POA: Diagnosis not present

## 2021-05-31 DIAGNOSIS — I739 Peripheral vascular disease, unspecified: Secondary | ICD-10-CM

## 2021-05-31 DIAGNOSIS — M79674 Pain in right toe(s): Secondary | ICD-10-CM

## 2021-05-31 DIAGNOSIS — M79675 Pain in left toe(s): Secondary | ICD-10-CM | POA: Diagnosis not present

## 2021-05-31 DIAGNOSIS — E11649 Type 2 diabetes mellitus with hypoglycemia without coma: Secondary | ICD-10-CM

## 2021-05-31 DIAGNOSIS — L84 Corns and callosities: Secondary | ICD-10-CM | POA: Diagnosis not present

## 2021-06-04 NOTE — Progress Notes (Signed)
  Subjective:  Patient ID: Steven Maldonado, male    DOB: 1941/09/13,  MRN: [id number]  Chief Complaint  Patient presents with   Foot Ulcer     right foot wound    80 y.o. male presents with the above complaint. History confirmed with patient.  He is doing well he had several cardiac issues over the last few month since I last saw him in September of last year.  The right foot wound has healed.  No new ulcerations.  His nails are thickened elongated and painful and he has a callus.  Objective:  Physical Exam: warm, good capillary refill, no trophic changes or ulcerative lesions, normal DP and PT pulses, and normal sensory exam.  Right Foot: dystrophic yellowed discolored nail plates with subungual debris and submetatarsal 5 callus  Assessment:   1. Pain due to onychomycosis of toenails of both feet   2. Pre-ulcerative calluses   3. Uncontrolled type 2 diabetes mellitus with hypoglycemia without coma ([place])   4. PAD (peripheral artery disease) ([place])      Plan:  Patient was evaluated and treated and all questions answered.  Patient educated on diabetes. Discussed proper diabetic foot care and discussed risks and complications of disease. Educated patient in depth on reasons to return to the office immediately should he/she discover anything concerning or new on the feet. All questions answered. Discussed proper shoes as well.   Discussed the etiology and treatment options for the condition in detail with the patient. Educated patient on the topical and oral treatment options for mycotic nails. Recommended debridement of the nails today. Sharp and mechanical debridement performed of all painful and mycotic nails today. Nails debrided in length and thickness using a nail nipper to level of comfort. Discussed treatment options including appropriate shoe gear. Follow up as needed for painful nails.  All symptomatic hyperkeratoses were safely debrided with a sterile #15 blade to patient's  level of comfort without incident. We discussed preventative and palliative care of these lesions including supportive and accommodative shoegear, padding, prefabricated and custom molded accommodative orthoses, use of a pumice stone and lotions/creams daily.   Return in about 3 months (around 08/31/2021) for at risk diabetic foot care.

## 2021-06-06 ENCOUNTER — Telehealth: Payer: Self-pay | Admitting: Cardiovascular Disease

## 2021-06-06 NOTE — Telephone Encounter (Signed)
Pt c/o medication issue:  1. Name of Medication:  dabigatran (PRADAXA) 150 MG CAPS capsule  2. How are you currently taking this medication (dosage and times per day)? As prescribed   3. Are you having a reaction (difficulty breathing--STAT)? No   4. What is your medication issue? Orson is calling stating this medication is longer covered by his insurance. He is needing an alternative medication prescribed due to this. Please advise.

## 2021-06-06 NOTE — Telephone Encounter (Signed)
Will route to PharmD and Dr. Angelena Form to consider alternatives to Pradaxa.

## 2021-06-07 NOTE — Telephone Encounter (Signed)
Eliquis is covered on his formulary, he qualifies for '5mg'$  BID dosing. He also has Pharmacist, community and can use the $10/month copay card, let me know if you need assistance getting him set up with this.

## 2021-06-12 NOTE — Telephone Encounter (Signed)
Spoke with patient.  He is in agreement to switch from Pradaxa to Eliquis 5 mg BID.

## 2021-06-13 MED ORDER — APIXABAN 5 MG PO TABS: 5.0000 mg | ORAL_TABLET | Freq: Two times a day (BID) | ORAL | 5 refills | Status: DC

## 2021-06-13 NOTE — Telephone Encounter (Signed)
Spoke w patient's wife (DPR). Adv of recommendation to pick up Eliquis at retail pharmacy in order tot use co pay card.  She will be at the office next week and will pick it up at that time.  [facility].  Copay card placed at front desk.

## 2021-06-14 DIAGNOSIS — H31011 Macula scars of posterior pole (postinflammatory) (post-traumatic), right eye: Secondary | ICD-10-CM | POA: Diagnosis not present

## 2021-06-14 DIAGNOSIS — H40013 Open angle with borderline findings, low risk, bilateral: Secondary | ICD-10-CM | POA: Diagnosis not present

## 2021-06-14 DIAGNOSIS — H35371 Puckering of macula, right eye: Secondary | ICD-10-CM | POA: Diagnosis not present

## 2021-06-14 DIAGNOSIS — H26493 Other secondary cataract, bilateral: Secondary | ICD-10-CM | POA: Diagnosis not present

## 2021-06-14 DIAGNOSIS — H26491 Other secondary cataract, right eye: Secondary | ICD-10-CM | POA: Diagnosis not present

## 2021-06-14 DIAGNOSIS — E113393 Type 2 diabetes mellitus with moderate nonproliferative diabetic retinopathy without macular edema, bilateral: Secondary | ICD-10-CM | POA: Diagnosis not present

## 2021-06-14 LAB — HM DIABETES EYE EXAM

## 2021-06-20 ENCOUNTER — Other Ambulatory Visit: Payer: Self-pay

## 2021-06-20 ENCOUNTER — Encounter (INDEPENDENT_AMBULATORY_CARE_PROVIDER_SITE_OTHER): Payer: Medicare Other | Admitting: Ophthalmology

## 2021-06-20 DIAGNOSIS — H35033 Hypertensive retinopathy, bilateral: Secondary | ICD-10-CM | POA: Diagnosis not present

## 2021-06-20 DIAGNOSIS — E113311 Type 2 diabetes mellitus with moderate nonproliferative diabetic retinopathy with macular edema, right eye: Secondary | ICD-10-CM

## 2021-06-20 DIAGNOSIS — H353112 Nonexudative age-related macular degeneration, right eye, intermediate dry stage: Secondary | ICD-10-CM | POA: Diagnosis not present

## 2021-06-20 DIAGNOSIS — I10 Essential (primary) hypertension: Secondary | ICD-10-CM | POA: Diagnosis not present

## 2021-06-20 DIAGNOSIS — H43813 Vitreous degeneration, bilateral: Secondary | ICD-10-CM | POA: Diagnosis not present

## 2021-06-20 DIAGNOSIS — E113392 Type 2 diabetes mellitus with moderate nonproliferative diabetic retinopathy without macular edema, left eye: Secondary | ICD-10-CM | POA: Diagnosis not present

## 2021-06-22 ENCOUNTER — Other Ambulatory Visit: Payer: Self-pay | Admitting: Physician Assistant

## 2021-06-22 DIAGNOSIS — I152 Hypertension secondary to endocrine disorders: Secondary | ICD-10-CM

## 2021-06-22 DIAGNOSIS — E039 Hypothyroidism, unspecified: Secondary | ICD-10-CM

## 2021-06-22 DIAGNOSIS — E1159 Type 2 diabetes mellitus with other circulatory complications: Secondary | ICD-10-CM

## 2021-06-22 DIAGNOSIS — E1169 Type 2 diabetes mellitus with other specified complication: Secondary | ICD-10-CM

## 2021-06-22 DIAGNOSIS — N183 Chronic kidney disease, stage 3 unspecified: Secondary | ICD-10-CM

## 2021-06-22 DIAGNOSIS — E11649 Type 2 diabetes mellitus with hypoglycemia without coma: Secondary | ICD-10-CM

## 2021-06-22 DIAGNOSIS — Z0000 Encounter for general adult medical examination without abnormal findings: Secondary | ICD-10-CM

## 2021-06-25 ENCOUNTER — Ambulatory Visit (INDEPENDENT_AMBULATORY_CARE_PROVIDER_SITE_OTHER): Payer: Medicare Other

## 2021-06-25 DIAGNOSIS — I4819 Other persistent atrial fibrillation: Secondary | ICD-10-CM

## 2021-06-25 DIAGNOSIS — I442 Atrioventricular block, complete: Secondary | ICD-10-CM

## 2021-06-27 LAB — CUP PACEART REMOTE DEVICE CHECK
Battery Remaining Longevity: 86 mo
Battery Voltage: 2.91 V
Brady Statistic AP VP Percent: 54.43 %
Brady Statistic AP VS Percent: 0.12 %
Brady Statistic AS VP Percent: 40.13 %
Brady Statistic AS VS Percent: 5.32 %
Brady Statistic RA Percent Paced: 57.89 %
Brady Statistic RV Percent Paced: 94.56 %
Date Time Interrogation Session: 20220815004225
Implantable Lead Implant Date: 20190124
Implantable Lead Implant Date: 20190124
Implantable Lead Location: 753859
Implantable Lead Location: 753860
Implantable Lead Model: 5076
Implantable Lead Model: 5076
Implantable Pulse Generator Implant Date: 20190124
Lead Channel Impedance Value: 304 Ohm
Lead Channel Impedance Value: 342 Ohm
Lead Channel Impedance Value: 361 Ohm
Lead Channel Impedance Value: 456 Ohm
Lead Channel Pacing Threshold Amplitude: 0.625 V
Lead Channel Pacing Threshold Amplitude: 0.625 V
Lead Channel Pacing Threshold Pulse Width: 0.4 ms
Lead Channel Pacing Threshold Pulse Width: 0.4 ms
Lead Channel Sensing Intrinsic Amplitude: 1.875 mV
Lead Channel Sensing Intrinsic Amplitude: 1.875 mV
Lead Channel Sensing Intrinsic Amplitude: 10.5 mV
Lead Channel Sensing Intrinsic Amplitude: 10.5 mV
Lead Channel Setting Pacing Amplitude: 2 V
Lead Channel Setting Pacing Amplitude: 2.5 V
Lead Channel Setting Pacing Pulse Width: 0.4 ms
Lead Channel Setting Sensing Sensitivity: 4 mV

## 2021-06-29 ENCOUNTER — Other Ambulatory Visit: Payer: Medicare Other

## 2021-06-29 ENCOUNTER — Other Ambulatory Visit: Payer: Self-pay

## 2021-06-29 DIAGNOSIS — E039 Hypothyroidism, unspecified: Secondary | ICD-10-CM | POA: Diagnosis not present

## 2021-06-29 DIAGNOSIS — E11649 Type 2 diabetes mellitus with hypoglycemia without coma: Secondary | ICD-10-CM

## 2021-06-29 DIAGNOSIS — E1169 Type 2 diabetes mellitus with other specified complication: Secondary | ICD-10-CM

## 2021-06-29 DIAGNOSIS — E782 Mixed hyperlipidemia: Secondary | ICD-10-CM | POA: Diagnosis not present

## 2021-06-29 DIAGNOSIS — E1159 Type 2 diabetes mellitus with other circulatory complications: Secondary | ICD-10-CM

## 2021-06-29 DIAGNOSIS — I152 Hypertension secondary to endocrine disorders: Secondary | ICD-10-CM | POA: Diagnosis not present

## 2021-06-29 DIAGNOSIS — Z0000 Encounter for general adult medical examination without abnormal findings: Secondary | ICD-10-CM | POA: Diagnosis not present

## 2021-06-29 DIAGNOSIS — E1165 Type 2 diabetes mellitus with hyperglycemia: Secondary | ICD-10-CM

## 2021-06-29 DIAGNOSIS — N183 Chronic kidney disease, stage 3 unspecified: Secondary | ICD-10-CM

## 2021-06-29 DIAGNOSIS — Z794 Long term (current) use of insulin: Secondary | ICD-10-CM

## 2021-06-30 LAB — CBC
Hematocrit: 37.2 % — ABNORMAL LOW (ref 37.5–51.0)
Hemoglobin: 12.5 g/dL — ABNORMAL LOW (ref 13.0–17.7)
MCH: 31.7 pg (ref 26.6–33.0)
MCHC: 33.6 g/dL (ref 31.5–35.7)
MCV: 94 fL (ref 79–97)
Platelets: 131 10*3/uL — ABNORMAL LOW (ref 150–450)
RBC: 3.94 x10E6/uL — ABNORMAL LOW (ref 4.14–5.80)
RDW: 12.3 % (ref 11.6–15.4)
WBC: 5.2 10*3/uL (ref 3.4–10.8)

## 2021-07-02 ENCOUNTER — Other Ambulatory Visit: Payer: Self-pay

## 2021-07-02 ENCOUNTER — Encounter: Payer: Self-pay | Admitting: Physician Assistant

## 2021-07-02 ENCOUNTER — Ambulatory Visit (INDEPENDENT_AMBULATORY_CARE_PROVIDER_SITE_OTHER): Payer: Medicare Other | Admitting: Physician Assistant

## 2021-07-02 VITALS — BP 108/65 | HR 97 | Temp 98.5°F | Ht 71.0 in | Wt 232.0 lb

## 2021-07-02 DIAGNOSIS — Z0000 Encounter for general adult medical examination without abnormal findings: Secondary | ICD-10-CM | POA: Diagnosis not present

## 2021-07-02 DIAGNOSIS — E11649 Type 2 diabetes mellitus with hypoglycemia without coma: Secondary | ICD-10-CM

## 2021-07-02 LAB — POCT GLYCOSYLATED HEMOGLOBIN (HGB A1C): Hemoglobin A1C: 7.2 % — AB (ref 4.0–5.6)

## 2021-07-02 NOTE — Patient Instructions (Signed)
Preventive Care 65 Years and Older, Male Preventive care refers to lifestyle choices and visits with your health care provider that can promote health and wellness. This includes: A yearly physical exam. This is also called an annual wellness visit. Regular dental and eye exams. Immunizations. Screening for certain conditions. Healthy lifestyle choices, such as: Eating a healthy diet. Getting regular exercise. Not using drugs or products that contain nicotine and tobacco. Limiting alcohol use. What can I expect for my preventive care visit? Physical exam Your health care provider will check your: Height and weight. These may be used to calculate your BMI (body mass index). BMI is a measurement that tells if you are at a healthy weight. Heart rate and blood pressure. Body temperature. Skin for abnormal spots. Counseling Your health care provider may ask you questions about your: Past medical problems. Family's medical history. Alcohol, tobacco, and drug use. Emotional well-being. Home life and relationship well-being. Sexual activity. Diet, exercise, and sleep habits. History of falls. Memory and ability to understand (cognition). Work and work environment. Access to firearms. What immunizations do I need?  Vaccines are usually given at various ages, according to a schedule. Your health care provider will recommend vaccines for you based on your age, medicalhistory, and lifestyle or other factors, such as travel or where you work. What tests do I need? Blood tests Lipid and cholesterol levels. These may be checked every 5 years, or more often depending on your overall health. Hepatitis C test. Hepatitis B test. Screening Lung cancer screening. You may have this screening every year starting at age 55 if you have a 30-pack-year history of smoking and currently smoke or have quit within the past 15 years. Colorectal cancer screening. All adults should have this screening  starting at age 50 and continuing until age 75. Your health care provider may recommend screening at age 45 if you are at increased risk. You will have tests every 1-10 years, depending on your results and the type of screening test. Prostate cancer screening. Recommendations will vary depending on your family history and other risks. Genital exam to check for testicular cancer or hernias. Diabetes screening. This is done by checking your blood sugar (glucose) after you have not eaten for a while (fasting). You may have this done every 1-3 years. Abdominal aortic aneurysm (AAA) screening. You may need this if you are a current or former smoker. STD (sexually transmitted disease) testing, if you are at risk. Follow these instructions at home: Eating and drinking  Eat a diet that includes fresh fruits and vegetables, whole grains, lean protein, and low-fat dairy products. Limit your intake of foods with high amounts of sugar, saturated fats, and salt. Take vitamin and mineral supplements as recommended by your health care provider. Do not drink alcohol if your health care provider tells you not to drink. If you drink alcohol: Limit how much you have to 0-2 drinks a day. Be aware of how much alcohol is in your drink. In the [place] one drink equals one 12 oz bottle of beer (355 mL), one 5 oz glass of wine (148 mL), or one 1 oz glass of hard liquor (44 mL).  Lifestyle Take daily care of your teeth and gums. Brush your teeth every morning and night with fluoride toothpaste. Floss one time each day. Stay active. Exercise for at least 30 minutes 5 or more days each week. Do not use any products that contain nicotine or tobacco, such as cigarettes, e-cigarettes, and chewing tobacco.   If you need help quitting, ask your health care provider. Do not use drugs. If you are sexually active, practice safe sex. Use a condom or other form of protection to prevent STIs (sexually transmitted infections). Talk  with your health care provider about taking a low-dose aspirin or statin. Find healthy ways to cope with stress, such as: Meditation, yoga, or listening to music. Journaling. Talking to a trusted person. Spending time with friends and family. Safety Always wear your seat belt while driving or riding in a vehicle. Do not drive: If you have been drinking alcohol. Do not ride with someone who has been drinking. When you are tired or distracted. While texting. Wear a helmet and other protective equipment during sports activities. If you have firearms in your house, make sure you follow all gun safety procedures. What's next? Visit your health care provider once a year for an annual wellness visit. Ask your health care provider how often you should have your eyes and teeth checked. Stay up to date on all vaccines. This information is not intended to replace advice given to you by your health care provider. Make sure you discuss any questions you have with your healthcare provider. Document Revised: 07/27/2019 Document Reviewed: 10/22/2018 Elsevier Patient Education  2022 Elsevier Inc.  

## 2021-07-02 NOTE — Progress Notes (Signed)
Subjective:   Steven Maldonado is a 80 y.o. male who presents for Medicare Annual/Subsequent preventive examination.  Review of Systems    General:   No F/C, wt loss Pulm:   No DIB, SOB, pleuritic chest pain Card:  No CP, palpitations Abd:  No n/v/d or pain Ext:  No inc edema from baseline    Objective:    Today's Vitals   07/02/21 1503  BP: 108/65  Pulse: 97  Temp: 98.5 F (36.9 C)  SpO2: 97%  Weight: 232 lb (105.2 kg)  Height: '5\' 11"'$  (1.803 m)   Body mass index is 32.36 kg/m.  Advanced Directives 11/08/2020 09/20/2020 08/23/2020 03/15/2020 02/08/2020 01/06/2020 10/06/2019  Does Patient Have a Medical Advance Directive? Yes Yes Yes Yes Yes Yes Yes  Type of Advance Directive Living will [place];Living will [place];Living will;Out of facility DNR (pink MOST or yellow form)  Does patient want to make changes to medical advance directive? No - Patient declined - - - - No - Patient declined -  Copy of [place] in Chart? - Yes - validated most recent copy scanned in chart (See row information) - No - copy requested No - copy requested - -  Would patient like information on creating a medical advance directive? - - - - - - -  Pre-existing out of facility DNR order (yellow form or pink MOST form) - - - - - - -    Current Medications (verified) Outpatient Encounter Medications as of 07/02/2021  Medication Sig   apixaban (ELIQUIS) 5 MG TABS tablet Take 1 tablet (5 mg total) by mouth 2 (two) times daily.   atorvastatin (LIPITOR) 40 MG tablet Take 1 tablet (40 mg total) by mouth daily.   Blood Glucose Monitoring Suppl (FREESTYLE LITE) DEVI 1 each by Does not apply route 2 (two) times daily.   Choline Fenofibrate (FENOFIBRIC ACID) 135 MG CPDR TAKE 1 CAPSULE BY MOUTH EVERY DAY   CLICKFINE PEN NEEDLES 32G X 4 MM MISC USE  FOUR TIMES DAILY FOR DIABETES   docusate sodium (COLACE) 100 MG capsule Take 300 mg by mouth daily.    ezetimibe (ZETIA) 10 MG tablet Take 1 tablet (10 mg total) by mouth daily.   FREESTYLE LITE test strip 1 EACH BY OTHER ROUTE 4 (FOUR) TIMES DAILY - BEFORE MEALS AND AT BEDTIME.   furosemide (LASIX) 20 MG tablet Take 1 tablet (20 mg total) by mouth every other day.   glipiZIDE (GLUCOTROL XL) 10 MG 24 hr tablet Take 1 tablet (10 mg total) by mouth daily with breakfast.   insulin lispro (HUMALOG KWIKPEN) 100 UNIT/ML KwikPen Inject 5-8 Units into the skin See admin instructions. Inject 5-8 units under the skin up to three times daily if eating sweets or high carb foods.   levothyroxine (SYNTHROID) 200 MCG tablet TAKE 1 TABLET BY MOUTH DAILY MONDAY-SATURDAY  TAKE 1/2 TABLET ON SUNDAY   metoprolol tartrate (LOPRESSOR) 50 MG tablet TAKE ONE AND ONE-HALF TABLET BY MOUTH TWICE A DAY   mirabegron ER (MYRBETRIQ) 25 MG TB24 tablet Take 1 tablet (25 mg total) by mouth daily.   mupirocin ointment (BACTROBAN) 2 % Apply to great toe wound daily, with gauze and tape   Semaglutide,0.25 or 0.'5MG'$ /DOS, (OZEMPIC, 0.25 OR 0.5 MG/DOSE,) 2 MG/1.5ML SOPN Inject 0.5 mg into the skin once a week.   TRESIBA FLEXTOUCH 200 UNIT/ML  FlexTouch Pen INJECT SUBCUTANEOUSLY 26   UNITS AT BEDTIME   No facility-administered encounter medications on file as of 07/02/2021.    Allergies (verified) Patient has no known allergies.   History: Past Medical History:  Diagnosis Date   Chronic diastolic CHF (congestive heart failure) (HCC)    CKD (chronic kidney disease), stage III (HCC)    Constipation    Coronary artery disease    a. Cath February 2012 in [facility], occluded RCA with collaterals   DM type 2 (diabetes mellitus, type 2) ([place])    Essential hypertension    Hyperlipidemia    Neuropathy    feet   Pacemaker    a. symptomatic brady after TAVR s/p MDT PPM by Dr. Curt Bears 12/04/17   Persistent atrial fibrillation (HCC)     PONV (postoperative nausea and vomiting)    after valve surgery   PVD (peripheral vascular disease) ([place])    a. s/p R popliteal artery stenosis tx with drug-coated balloon 05/2014, followed by Dr. Fletcher Anon.   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 12/02/17: s/p TAVR   Skin cancer    Sleep apnea with use of continuous positive airway pressure (CPAP)    04-11-11 AHI was 32.9 and titrated to 15 cm H20, DME is [facility]   Subclinical hypothyroidism    Past Surgical History:  Procedure Laterality Date   ABDOMINAL ANGIOGRAM N/A 06/08/2014   Procedure: ABDOMINAL ANGIOGRAM;  Surgeon: Wellington Hampshire, MD;  Location: [facility] CATH [facility];  Service: Cardiovascular;  Laterality: N/A;   ABDOMINAL AORTOGRAM N/A 04/09/2018   Procedure: ABDOMINAL AORTOGRAM;  Surgeon: Conrad Custer, MD;  Location: [facility] CV LAB;  Service: Cardiovascular;  Laterality: N/A;   ABDOMINAL AORTOGRAM W/LOWER EXTREMITY N/A 08/23/2020   Procedure: ABDOMINAL AORTOGRAM W/LOWER EXTREMITY;  Surgeon: Wellington Hampshire, MD;  Location: [facility] CV LAB;  Service: Cardiovascular;  Laterality: N/A;   AMPUTATION Left 04/17/2018   Procedure: LEFT FOOT 3RD RAY AMPUTATION;  Surgeon: Newt Minion, MD;  Location: [facility];  Service: Orthopedics;  Laterality: Left;   AMPUTATION Left 05/28/2018   Procedure: LEFT AMPUTATION BELOW KNEE;  Surgeon: Wylene Simmer, MD;  Location: [facility];  Service: Orthopedics;  Laterality: Left;   [place] N/A 11/08/2020   Procedure: ATRIAL FIBRILLATION ABLATION;  Surgeon: Constance Haw, MD;  Location: [facility] CV LAB;  Service: Cardiovascular;  Laterality: N/A;   BELOW KNEE LEG AMPUTATION Left 05/28/2018   CARDIAC CATHETERIZATION  12/2010   CARDIOVERSION  07/2011   CARDIOVERSION N/A 04/18/2014   Procedure: CARDIOVERSION;  Surgeon:  Dorothy Spark, MD;  Location: [facility];  Service: Cardiovascular;  Laterality: N/A;   CARDIOVERSION N/A 11/03/2015   Procedure: CARDIOVERSION;  Surgeon: Lelon Perla, MD;  Location: [facility] ENDOSCOPY;  Service: Cardiovascular;  Laterality: N/A;   CARDIOVERSION N/A 05/08/2017   Procedure: CARDIOVERSION;  Surgeon: Dorothy Spark, MD;  Location: [facility];  Service: Cardiovascular;  Laterality: N/A;   CARDIOVERSION N/A 07/28/2017   Procedure: CARDIOVERSION;  Surgeon: Dorothy Spark, MD;  Location: [facility] ENDOSCOPY;  Service: Cardiovascular;  Laterality: N/A;   CARDIOVERSION N/A 02/08/2020   Procedure: CARDIOVERSION;  Surgeon: Buford Dresser, MD;  Location: [facility] ENDOSCOPY;  Service: Cardiovascular;  Laterality: N/A;   CARDIOVERSION N/A 03/15/2020   Procedure: CARDIOVERSION;  Surgeon: Thayer Headings, MD;  Location: [place];  Service: Cardiovascular;  Laterality: N/A;   CARDIOVERSION N/A 09/20/2020   Procedure: CARDIOVERSION;  Surgeon: Werner Lean, MD;  Location: [place];  Service: Cardiovascular;  Laterality: N/A;   LOWER EXTREMITY ANGIOGRAM N/A 06/08/2014   Procedure: LOWER EXTREMITY ANGIOGRAM;  Surgeon: Wellington Hampshire, MD;  Location: [facility] CATH LAB;  Service: Cardiovascular;  Laterality: N/A;   LOWER EXTREMITY ANGIOGRAPHY Left 04/09/2018   Procedure: Lower Extremity Angiography;  Surgeon: Conrad Grundy, MD;  Location: [place] CV LAB;  Service: Cardiovascular;  Laterality: Left;   PACEMAKER IMPLANT N/A 12/04/2017   Procedure: PACEMAKER IMPLANT;  Surgeon: Constance Haw, MD;  Location: [place] CV LAB;  Service: Cardiovascular;  Laterality: N/A;   PERIPHERAL VASCULAR ATHERECTOMY  08/23/2020   Procedure: PERIPHERAL VASCULAR ATHERECTOMY;  Surgeon: Wellington Hampshire, MD;  Location: [place] CV LAB;  Service: Cardiovascular;;   PERIPHERAL VASCULAR BALLOON ANGIOPLASTY Left 04/09/2018   Procedure: PERIPHERAL VASCULAR BALLOON ANGIOPLASTY;  Surgeon: Conrad , MD;  Location: [facility] [place] CV LAB;  Service: Cardiovascular;  Laterality: Left;  SFA   POPLITEAL ARTERY ANGIOPLASTY Right 06/08/2014   Archie Endo 06/08/2014   RIGHT/LEFT HEART CATH AND CORONARY ANGIOGRAPHY N/A 10/08/2017   Procedure: RIGHT/LEFT HEART CATH AND CORONARY ANGIOGRAPHY;  Surgeon: Burnell Blanks, MD;  Location: [facility] CV LAB;  Service: Cardiovascular;  Laterality: N/A;   SKIN CANCER EXCISION Bilateral    "have had them cut off back of neck X 2; off left upper arm; right wrist, near right shoulder blade" (06/08/2014)   TEE WITHOUT CARDIOVERSION N/A 12/02/2017   Procedure: TRANSESOPHAGEAL ECHOCARDIOGRAM (TEE);  Surgeon: Burnell Blanks, MD;  Location: [facility];  Service: Open Heart Surgery;  Laterality: N/A;   TEMPORARY PACEMAKER N/A 12/04/2017   Procedure: TEMPORARY PACEMAKER;  Surgeon: Leonie Man, MD;  Location: [place] CV LAB;  Service: Cardiovascular;  Laterality: N/A;   TRANSCATHETER AORTIC VALVE REPLACEMENT, TRANSFEMORAL N/A 12/02/2017   Procedure: TRANSCATHETER AORTIC VALVE REPLACEMENT, TRANSFEMORAL;  Surgeon: Burnell Blanks, MD;  Location: [facility];  Service: Open Heart Surgery;  Laterality: N/A;  using Edwards Sapien 3 Transcatheter Heart Valve size 37m   Family History  Problem Relation Age of Onset   Diabetes Mother    Heart attack Mother    Hypertension Mother    Heart attack Father    Heart failure Father    Hypertension Father    Diabetes Father    Diabetes Sister    Diabetes Brother    Diabetes Other    Diabetes Daughter        TYPE ll   Heart Problems Daughter    Hypertension Sister    Hypertension Brother    Stroke Brother    Social History   Socioeconomic History   Marital status: Married    Spouse name: BRise Paganini  Number of children: 1   Years of education: 12   Highest education level: Not on file  Occupational History   Occupation: retired    [employer] farm manager [employer]  Tobacco Use   Smoking status: Former     Packs/day: 2.00    Years: 14.00    Pack years: 28.00    Types: Cigarettes    Quit date: 11/11/1972    Years since quitting: 48.6   Smokeless tobacco: Never  Vaping Use   Vaping Use: Never used  Substance and Sexual Activity   Alcohol use: No    Comment: quit in 1984   Drug use: No  Sexual activity: Not Currently  Other Topics Concern   Not on file  Social History Narrative   Patient is married [name]) and lives at home with his wife.   Patient has one child and his wife has one child.   Patient is retired.   Patient has a high school education.   Patient is right-handed.   Patient drinks very little caffeine.   Social Determinants of Health   Financial Resource Strain: Not on file  Food Insecurity: No Food Insecurity   Worried About Charity fundraiser in the Last Year: Never true   Ran Out of Food in the Last Year: Never true  Transportation Needs: No Transportation Needs   Lack of Transportation (Medical): No   Lack of Transportation (Non-Medical): No  Physical Activity: Not on file  Stress: Not on file  Social Connections: Not on file    Tobacco Counseling Counseling given: Not Answered    Diabetic? yes   Activities of Daily Living In your present state of health, do you have any difficulty performing the following activities: 07/02/2021 03/29/2021  Hearing? N N  Vision? N N  Difficulty concentrating or making decisions? N N  Walking or climbing stairs? N N  Dressing or bathing? N N  Doing errands, shopping? N N  Some recent data might be hidden    Patient Care Team: Lorrene Reid, [name] as PCP - General (Physician Assistant) Constance Haw, MD as PCP - Electrophysiology (Cardiology) Burnell Blanks, MD as PCP - Cardiology (Cardiology) Newt Minion, MD as Consulting Physician (Orthopedic Surgery) Pleasant, Eppie Gibson, RN as [place] Management McElhaney, Jannette Fogo, [place] (Cardiology) Wylene Simmer, MD as Consulting  Physician (Orthopedic Surgery) Elayne Snare, MD as Consulting Physician (Endocrinology) Dasher, Rayvon Char, MD (Dermatology) [facility], [place] any recent Medical Services you may have received from other than [facility] providers in the past year (date may be approximate).     Assessment:   This is a routine wellness examination for Deniel.  Hearing/Vision screen No results found.  Dietary issues and exercise activities discussed:    Goals Addressed   None   Depression Screen PHQ 2/9 Scores 04/20/2021 03/29/2021 12/28/2020 09/26/2020 05/25/2020 01/24/2020 01/06/2020  PHQ - 2 Score 0 0 0 0 0 0 0  PHQ- 9 Score - 0 0 0 0 0 -    Fall Risk Fall Risk  04/20/2021 03/29/2021 12/28/2020 12/20/2020 09/26/2020  Falls in the past year? 1 0 0 1 0  Number falls in past yr: 1 0 - 1 -  Injury with Fall? 0 0 - 0 -  Risk for fall due to : History of fall(s);Impaired balance/gait;Impaired mobility No Fall Risks - History of fall(s);Impaired balance/gait;Impaired mobility -  Follow up Falls evaluation completed;Falls prevention discussed;Education provided Falls evaluation completed Falls evaluation completed Falls evaluation completed;Falls prevention discussed Falls evaluation completed    FALL RISK PREVENTION PERTAINING TO THE HOME:  Any stairs in or around the home? Yes  If so, are there any without handrails? No  Home free of loose throw rugs in walkways, pet beds, electrical cords, etc? Yes  Adequate lighting in your home to reduce risk of falls? Yes   ASSISTIVE DEVICES UTILIZED TO PREVENT FALLS:  Life alert? Yes  Use of a cane, walker or w/c? Yes  Grab bars in the bathroom? Yes  Shower chair or bench in shower? Yes  Elevated toilet seat or a handicapped toilet? No  TIMED UP AND GO:  Was the test performed? Yes .  Length of time to ambulate 10 feet: 20 sec.   Gait unsteady with use of assistive device, provider informed and education provided.   Cognitive  Function: wnl's      6CIT Screen 07/02/2021  What Year? 0 points  What month? 0 points  What time? 0 points  Count back from 20 0 points  Months in reverse 0 points  Repeat phrase 4 points  Total Score 4    Immunizations Immunization History  Administered Date(s) Administered   Influenza Whole 08/07/2012   Influenza, High Dose Seasonal PF 07/27/2014, 07/20/2018, 09/07/2019   Influenza-Unspecified 08/11/2013, 08/04/2015, 08/01/2020   PFIZER(Purple Top)SARS-COV-2 Vaccination 11/26/2019, 12/27/2019, 07/12/2020   Pneumococcal Conjugate-13 06/02/2014   Pneumococcal Polysaccharide-23 01/01/2011   Tdap 06/02/2014   Zoster Recombinat (Shingrix) 09/07/2019, 11/11/2019   Zoster, Live 06/16/2009    TDAP status: Up to date  Flu Vaccine status: Up to date  Pneumococcal vaccine status: Up to date  Covid-19 vaccine status: Completed vaccines  Qualifies for Shingles Vaccine? Yes   Zostavax completed Yes   Shingrix Completed?: Yes  Screening Tests Health Maintenance  Topic Date Due   Hepatitis C Screening  Never done   COVID-19 Vaccine (4 - Booster for Pfizer series) 10/11/2020   URINE MICROALBUMIN  05/25/2021   INFLUENZA VACCINE  06/11/2021   HEMOGLOBIN A1C  11/15/2021   OPHTHALMOLOGY EXAM  12/07/2021   FOOT EXAM  07/02/2022   TETANUS/TDAP  06/02/2024   PNA vac Low Risk Adult  Completed   Zoster Vaccines- Shingrix  Completed   HPV VACCINES  Aged Out    Health Maintenance  Health Maintenance Due  Topic Date Due   Hepatitis C Screening  Never done   COVID-19 Vaccine (4 - Booster for Pfizer series) 10/11/2020   URINE MICROALBUMIN  05/25/2021   INFLUENZA VACCINE  06/11/2021    Colorectal cancer screening: No longer required.   Lung Cancer Screening: (Low Dose CT Chest recommended if Age 26-80 years, 30 pack-year currently smoking OR have quit w/in 15years.) does not qualify.   Lung Cancer Screening Referral: n/a  Additional Screening:  Hepatitis C Screening: does  qualify; Completed pt declined   Vision Screening: Recommended annual ophthalmology exams for early detection of glaucoma and other disorders of the eye. Is the patient up to date with their annual eye exam?  Yes  Who is the provider or what is the name of the office in which the patient attends annual eye exams? Dr. Herbert Deaner  If pt is not established with a provider, would they like to be referred to a provider to establish care? No .   Dental Screening: Recommended annual dental exams for proper oral hygiene  Community Resource Referral / Chronic Care Management: CRR required this visit?  No   CCM required this visit?  No      Plan:  -Recent CBC stable. A1c has improved from 9.2, stable at 7.2. -Continue to follow up with various specialist. -Continue current medication regimen. -Advised patient fasting labs can be collected a few days prior to his next follow up visit with Dr. Dwyane Dee. -Follow up  in 3 months for DM, HTN, HLD  I have personally reviewed and noted the following in the patient's chart:   Medical and social history Use of alcohol, tobacco or illicit drugs  Current medications and supplements including opioid prescriptions. Patient is not currently taking opioid prescriptions. Functional ability and status Nutritional status Physical activity  Advanced directives List of other physicians Hospitalizations, surgeries, and ER visits in previous 12 months Vitals Screenings to include cognitive, depression, and falls Referrals and appointments  In addition, I have reviewed and discussed with patient certain preventive protocols, quality metrics, and best practice recommendations. A written personalized care plan for preventive services as well as general preventive health recommendations were provided to patient.     Lorrene Reid, [name]   07/02/2021

## 2021-07-03 ENCOUNTER — Ambulatory Visit (INDEPENDENT_AMBULATORY_CARE_PROVIDER_SITE_OTHER): Payer: Medicare Other | Admitting: Cardiovascular Disease

## 2021-07-03 VITALS — BP 131/69 | HR 92 | Ht 71.0 in | Wt 232.0 lb

## 2021-07-03 DIAGNOSIS — E785 Hyperlipidemia, unspecified: Secondary | ICD-10-CM | POA: Diagnosis not present

## 2021-07-03 DIAGNOSIS — I739 Peripheral vascular disease, unspecified: Secondary | ICD-10-CM

## 2021-07-03 DIAGNOSIS — I2510 Atherosclerotic heart disease of native coronary artery without angina pectoris: Secondary | ICD-10-CM | POA: Diagnosis not present

## 2021-07-03 DIAGNOSIS — I480 Paroxysmal atrial fibrillation: Secondary | ICD-10-CM

## 2021-07-03 MED ORDER — METOPROLOL TARTRATE 50 MG PO TABS: ORAL_TABLET | ORAL | 3 refills | Status: AC

## 2021-07-03 NOTE — Patient Instructions (Signed)
Medication Instructions:  Your physician recommends that you continue on your current medications as directed. Please refer to the Current Medication list given to you today.  *If you need a refill on your cardiac medications before your next appointment, please call your pharmacy*  Follow-Up: At [facility], you and your health needs are our priority.  As part of our continuing mission to provide you with exceptional heart care, we have created designated Provider Care Teams.  These Care Teams include your primary Cardiologist (physician) and Advanced Practice Providers (APPs -  Physician Assistants and Nurse Practitioners) who all work together to provide you with the care you need, when you need it.  We recommend signing up for the patient portal called "MyChart".  Sign up information is provided on this After Visit Summary.  MyChart is used to connect with patients for Virtual Visits (Telemedicine).  Patients are able to view lab/test results, encounter notes, upcoming appointments, etc.  Non-urgent messages can be sent to your provider as well.   To learn more about what you can do with MyChart, go to [email or web address].    Your next appointment:   6 month(s)  The format for your next appointment:   In Person  Provider:   [name], MD   Other Instructions

## 2021-07-03 NOTE — Progress Notes (Signed)
Cardiology Office Note   Date:  07/03/2021   ID:  Jiho, Eckart 09/15/1941, MRN [id number]  PCP:  Lorrene Reid, [name]  Cardiologist:  Dr. Angelena Form  No chief complaint on file.     History of Present Illness: Steven Maldonado is a 80 y.o. male who presents for  a followup visit regarding  PAD . 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 He is followed by me for peripheral arterial disease.  I performed successful angioplasty and drug-coated balloon angioplasty to the right popliteal artery in 2015.  He was subsequently seen by Dr. 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   He was seen in 2021 for small ulceration on the right great toe .  He had Doppler studies done which showed an occluded heavily calcified distal SFA/popliteal artery. Angiography in October of 2021 showed no significant aortoiliac disease.  On the right side, there was severe heavily calcified stenosis in the distal SFA with heavily calcified occluded popliteal artery above the knee with severe stenosis affecting the distal anterior tibial artery, occluded distal posterior tibial artery and occluded distal peroneal artery.  I performed successful orbital atherectomy and drug-coated balloon angioplasty to the right distal SFA and popliteal arteries with excellent results.     He has been doing very well with no recurrent claudication no lower extremity ulceration.  No chest pain or worsening dyspnea.  He was recently switched from Pradaxa to Eliquis.  Past Medical History:  Diagnosis Date   Chronic diastolic CHF (congestive heart failure) (HCC)    CKD (chronic kidney disease), stage III (HCC)    Constipation    Coronary artery disease    a. Cath February 2012 in [place],  occluded RCA with collaterals   DM type 2 (diabetes mellitus, type 2) ([place])    Essential hypertension    Hyperlipidemia    Neuropathy    feet   Pacemaker    a. symptomatic brady after TAVR s/p MDT PPM by Dr. Curt Bears 12/04/17   Persistent atrial fibrillation (HCC)    PONV (postoperative nausea and vomiting)    after valve surgery   PVD (peripheral vascular disease) ([place])    a. s/p R popliteal artery stenosis tx with drug-coated balloon 05/2014, followed by Dr. Fletcher Anon.   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 12/02/17: s/p TAVR   Skin cancer    Sleep apnea with use of continuous positive airway pressure (CPAP)    04-11-11 AHI was 32.9 and titrated to 15 cm H20, DME is [facility]   Subclinical hypothyroidism     Past Surgical History:  Procedure Laterality Date   ABDOMINAL ANGIOGRAM N/A 06/08/2014   Procedure: ABDOMINAL ANGIOGRAM;  Surgeon: Wellington Hampshire, MD;  Location: [place] CATH LAB;  Service: Cardiovascular;  Laterality: N/A;   ABDOMINAL AORTOGRAM N/A 04/09/2018   Procedure: ABDOMINAL AORTOGRAM;  Surgeon: Conrad Leando, MD;  Location: [place] CV LAB;  Service: Cardiovascular;  Laterality: N/A;   ABDOMINAL AORTOGRAM W/LOWER EXTREMITY N/A 08/23/2020   Procedure: ABDOMINAL AORTOGRAM W/LOWER EXTREMITY;  Surgeon: Wellington Hampshire, MD;  Location: [place] CV LAB;  Service:  Cardiovascular;  Laterality: N/A;   AMPUTATION Left 04/17/2018   Procedure: LEFT FOOT 3RD RAY AMPUTATION;  Surgeon: Newt Minion, MD;  Location: [place];  Service: Orthopedics;  Laterality: Left;   AMPUTATION Left 05/28/2018   Procedure: LEFT AMPUTATION BELOW KNEE;  Surgeon: Wylene Simmer, MD;  Location: [place];  Service: Orthopedics;  Laterality: Left;   [place] N/A 11/08/2020   Procedure: ATRIAL FIBRILLATION ABLATION;  Surgeon:  Constance Haw, MD;  Location: [place] CV LAB;  Service: Cardiovascular;  Laterality: N/A;   BELOW KNEE LEG AMPUTATION Left 05/28/2018   CARDIAC CATHETERIZATION  12/2010   CARDIOVERSION  07/2011   CARDIOVERSION N/A 04/18/2014   Procedure: CARDIOVERSION;  Surgeon: Dorothy Spark, MD;  Location: [place];  Service: Cardiovascular;  Laterality: N/A;   CARDIOVERSION N/A 11/03/2015   Procedure: CARDIOVERSION;  Surgeon: Lelon Perla, MD;  Location: [facility] ENDOSCOPY;  Service: Cardiovascular;  Laterality: N/A;   CARDIOVERSION N/A 05/08/2017   Procedure: CARDIOVERSION;  Surgeon: Dorothy Spark, MD;  Location: [place];  Service: Cardiovascular;  Laterality: N/A;   CARDIOVERSION N/A 07/28/2017   Procedure: CARDIOVERSION;  Surgeon: Dorothy Spark, MD;  Location: [facility] ENDOSCOPY;  Service: Cardiovascular;  Laterality: N/A;   CARDIOVERSION N/A 02/08/2020   Procedure: CARDIOVERSION;  Surgeon: Buford Dresser, MD;  Location: [facility] ENDOSCOPY;  Service: Cardiovascular;  Laterality: N/A;   CARDIOVERSION N/A 03/15/2020   Procedure: CARDIOVERSION;  Surgeon: Acie Fredrickson Wonda Cheng, MD;  Location: [facility] ENDOSCOPY;  Service: Cardiovascular;  Laterality: N/A;   CARDIOVERSION N/A 09/20/2020   Procedure: CARDIOVERSION;  Surgeon: Werner Lean, MD;  Location: [place];  Service: Cardiovascular;  Laterality: N/A;   LOWER EXTREMITY ANGIOGRAM N/A 06/08/2014   Procedure: LOWER EXTREMITY ANGIOGRAM;  Surgeon: Wellington Hampshire, MD;  Location: [place] CATH LAB;  Service: Cardiovascular;  Laterality: N/A;   LOWER EXTREMITY ANGIOGRAPHY Left 04/09/2018   Procedure: Lower Extremity Angiography;  Surgeon: Conrad Yardville, MD;  Location: [place] CV LAB;  Service: Cardiovascular;  Laterality: Left;   PACEMAKER IMPLANT N/A 12/04/2017   Procedure: PACEMAKER IMPLANT;  Surgeon: Constance Haw, MD;  Location: [place] CV LAB;  Service: Cardiovascular;  Laterality: N/A;   PERIPHERAL VASCULAR ATHERECTOMY   08/23/2020   Procedure: PERIPHERAL VASCULAR ATHERECTOMY;  Surgeon: Wellington Hampshire, MD;  Location: [place] CV LAB;  Service: Cardiovascular;;   PERIPHERAL VASCULAR BALLOON ANGIOPLASTY Left 04/09/2018   Procedure: PERIPHERAL VASCULAR BALLOON ANGIOPLASTY;  Surgeon: Conrad Crete, MD;  Location: [place] CV LAB;  Service: Cardiovascular;  Laterality: Left;  SFA   POPLITEAL ARTERY ANGIOPLASTY Right 06/08/2014   Archie Endo 06/08/2014   RIGHT/LEFT HEART CATH AND CORONARY ANGIOGRAPHY N/A 10/08/2017   Procedure: RIGHT/LEFT HEART CATH AND CORONARY ANGIOGRAPHY;  Surgeon: Burnell Blanks, MD;  Location: [place] CV LAB;  Service: Cardiovascular;  Laterality: N/A;   SKIN CANCER EXCISION Bilateral    "have had them cut off back of neck X 2; off left upper arm; right wrist, near right shoulder blade" (06/08/2014)   TEE WITHOUT CARDIOVERSION N/A 12/02/2017   Procedure: TRANSESOPHAGEAL ECHOCARDIOGRAM (TEE);  Surgeon: Burnell Blanks, MD;  Location: [place];  Service: Open Heart Surgery;  Laterality: N/A;   TEMPORARY PACEMAKER N/A 12/04/2017   Procedure: TEMPORARY PACEMAKER;  Surgeon: Leonie Man, MD;  Location: [place] CV LAB;  Service: Cardiovascular;  Laterality: N/A;   TRANSCATHETER AORTIC VALVE REPLACEMENT, TRANSFEMORAL N/A 12/02/2017   Procedure: TRANSCATHETER AORTIC VALVE REPLACEMENT, TRANSFEMORAL;  Surgeon: Burnell Blanks, MD;  Location: [place];  Service: Open Heart Surgery;  Laterality: N/A;  using Edwards Sapien 3 Transcatheter Heart Valve size 45m     Current Outpatient Medications  Medication Sig Dispense Refill   apixaban (ELIQUIS) 5 MG TABS tablet Take 1 tablet (5 mg total) by mouth 2 (two) times daily. 60 tablet 5   atorvastatin (LIPITOR) 40 MG tablet Take 1 tablet (40 mg total) by mouth daily. 90 tablet 3   Blood Glucose Monitoring Suppl (FREESTYLE LITE) DEVI 1 each by Does not apply route 2 (two) times daily. 1 each 0   Choline Fenofibrate (FENOFIBRIC ACID)  135 MG CPDR TAKE 1 CAPSULE BY MOUTH EVERY DAY 90 capsule 0   CLICKFINE PEN NEEDLES 32G X 4 MM MISC USE FOUR TIMES DAILY FOR DIABETES 200 each 2   docusate sodium (COLACE) 100 MG capsule Take 300 mg by mouth daily.      ezetimibe (ZETIA) 10 MG tablet Take 1 tablet (10 mg total) by mouth daily. 90 tablet 3   FREESTYLE LITE test strip 1 EACH BY OTHER ROUTE 4 (FOUR) TIMES DAILY - BEFORE MEALS AND AT BEDTIME. 450 strip 3   furosemide (LASIX) 20 MG tablet Take 1 tablet (20 mg total) by mouth every other day.     glipiZIDE (GLUCOTROL XL) 10 MG 24 hr tablet Take 1 tablet (10 mg total) by mouth daily with breakfast. 90 tablet 1   insulin lispro (HUMALOG KWIKPEN) 100 UNIT/ML KwikPen Inject 5-8 Units into the skin See admin instructions. Inject 5-8 units under the skin up to three times daily if eating sweets or high carb foods. 30 mL 3   levothyroxine (SYNTHROID) 200 MCG tablet TAKE 1 TABLET BY MOUTH DAILY MONDAY-SATURDAY  TAKE 1/2 TABLET ON SUNDAY 30 tablet 5   mirabegron ER (MYRBETRIQ) 25 MG TB24 tablet Take 1 tablet (25 mg total) by mouth daily. 90 tablet 2   mupirocin ointment (BACTROBAN) 2 % Apply to great toe wound daily, with gauze and tape 30 g 0   Semaglutide,0.25 or 0.'5MG'$ /DOS, (OZEMPIC, 0.25 OR 0.5 MG/DOSE,) 2 MG/1.5ML SOPN Inject 0.5 mg into the skin once a week. 4.5 mL 1   TRESIBA FLEXTOUCH 200 UNIT/ML FlexTouch Pen INJECT SUBCUTANEOUSLY 26   UNITS AT BEDTIME 18 mL 1   metoprolol tartrate (LOPRESSOR) 50 MG tablet TAKE ONE AND ONE-HALF TABLET BY MOUTH TWICE A DAY 270 tablet 3   No current facility-administered medications for this visit.    Allergies:   Patient has no known allergies.    Social History:  The patient  reports that he quit smoking about 48 years ago. His smoking use included cigarettes. He has a 28.00 pack-year smoking history. He has never used smokeless tobacco. He reports that he does not drink alcohol and does not use drugs.   Family History:  The patient's family history  includes Diabetes in his brother, daughter, father, mother, sister, and another family member; Heart Problems in his daughter; Heart attack in his father and mother; Heart failure in his father; Hypertension in his brother, father, mother, and sister; Stroke in his brother.    ROS:  Please see the history of present illness.   Otherwise, review of systems are positive for none.   All other systems are reviewed and negative.    PHYSICAL EXAM: VS:  BP 131/69   Pulse 92   Ht '5\' 11"'$  (1.803 m)   Wt 232 lb (105.2 kg)   SpO2 100%  BMI 32.36 kg/m  , BMI Body mass index is 32.36 kg/m. GEN: Well nourished, well developed, in no acute distress  HEENT: normal  Neck: no JVD, carotid bruits, or masses Cardiac: Irregularly irregular; no  rubs, or gallops,no edema . There is a 3/6 mid-late peaking aortic stenosis murmur Respiratory:  clear to auscultation bilaterally, normal work of breathing GI: soft, nontender, nondistended, + BS MS: no deformity or atrophy  Skin: warm and dry, no rash Neuro:  Strength and sensation are intact Psych: euthymic mood, full affect Vascular: Femoral pulses are normal.    EKG:  EKG is not ordered today.    Recent Labs: 03/01/2021: ALT 20; BUN 27; Creatinine, Ser 1.66; Potassium 4.6; Sodium 140; TSH 4.41 06/29/2021: Hemoglobin 12.5; Platelets 131    Lipid Panel    Component Value Date/Time   CHOL 158 03/01/2021 1338   CHOL 161 01/24/2020 1045   TRIG 142.0 03/01/2021 1338   HDL 35.80 (L) 03/01/2021 1338   HDL 34 (L) 01/24/2020 1045   CHOLHDL 4 03/01/2021 1338   VLDL 28.4 03/01/2021 1338   LDLCALC 94 03/01/2021 1338   LDLCALC 106 (H) 01/24/2020 1045   LDLCALC 142 (H) 05/31/2019 1246   LDLDIRECT 123.0 12/03/2019 1136      Wt Readings from Last 3 Encounters:  07/03/21 232 lb (105.2 kg)  07/02/21 232 lb (105.2 kg)  05/15/21 239 lb 12.8 oz (108.8 kg)        ASSESSMENT AND PLAN:  1.  Peripheral arterial disease: Status post left BKA. Status post  orbital atherectomy and drug-coated balloon angioplasty to the right SFA and popliteal arteries for critical limb ischemia with subsequent resolution of ulceration.  Currently with no claudication.  We will plan on repeat arterial Doppler studies in April.  I asked him to notify me if he has any recurrent ulceration in the right lower extremity as he is known to have significant tibial disease.  2. Atrial fibrillation: He is currently on anticoagulation with Eliquis.  3. Hyperlipidemia: Continue treatment with high dose atorvastatin with a target LDL of less than 70.  4. Coronary artery disease involving native coronary arteries without angina or medical therapy.  5.  Status post TAVR: Seems to be stable.   Disposition:   FU with me in 6 months.  Signed,  [name], MD  07/03/2021 10:14 AM    [place]

## 2021-07-13 NOTE — Progress Notes (Signed)
Remote pacemaker transmission.   

## 2021-07-18 ENCOUNTER — Encounter (INDEPENDENT_AMBULATORY_CARE_PROVIDER_SITE_OTHER): Payer: Medicare Other | Admitting: Ophthalmology

## 2021-07-18 ENCOUNTER — Other Ambulatory Visit: Payer: Self-pay

## 2021-07-18 DIAGNOSIS — E113311 Type 2 diabetes mellitus with moderate nonproliferative diabetic retinopathy with macular edema, right eye: Secondary | ICD-10-CM | POA: Diagnosis not present

## 2021-07-18 DIAGNOSIS — H35033 Hypertensive retinopathy, bilateral: Secondary | ICD-10-CM | POA: Diagnosis not present

## 2021-07-18 DIAGNOSIS — I10 Essential (primary) hypertension: Secondary | ICD-10-CM | POA: Diagnosis not present

## 2021-07-18 DIAGNOSIS — E113392 Type 2 diabetes mellitus with moderate nonproliferative diabetic retinopathy without macular edema, left eye: Secondary | ICD-10-CM | POA: Diagnosis not present

## 2021-07-18 DIAGNOSIS — H353112 Nonexudative age-related macular degeneration, right eye, intermediate dry stage: Secondary | ICD-10-CM | POA: Diagnosis not present

## 2021-07-18 DIAGNOSIS — H43813 Vitreous degeneration, bilateral: Secondary | ICD-10-CM | POA: Diagnosis not present

## 2021-07-23 DIAGNOSIS — N1832 Chronic kidney disease, stage 3b: Secondary | ICD-10-CM | POA: Diagnosis not present

## 2021-08-02 ENCOUNTER — Other Ambulatory Visit: Payer: Self-pay

## 2021-08-02 ENCOUNTER — Other Ambulatory Visit: Payer: Medicare Other

## 2021-08-02 DIAGNOSIS — E1159 Type 2 diabetes mellitus with other circulatory complications: Secondary | ICD-10-CM | POA: Diagnosis not present

## 2021-08-02 DIAGNOSIS — E559 Vitamin D deficiency, unspecified: Secondary | ICD-10-CM | POA: Diagnosis not present

## 2021-08-02 DIAGNOSIS — E1169 Type 2 diabetes mellitus with other specified complication: Secondary | ICD-10-CM

## 2021-08-02 DIAGNOSIS — E11649 Type 2 diabetes mellitus with hypoglycemia without coma: Secondary | ICD-10-CM

## 2021-08-02 DIAGNOSIS — E1122 Type 2 diabetes mellitus with diabetic chronic kidney disease: Secondary | ICD-10-CM | POA: Diagnosis not present

## 2021-08-02 DIAGNOSIS — E039 Hypothyroidism, unspecified: Secondary | ICD-10-CM | POA: Diagnosis not present

## 2021-08-02 DIAGNOSIS — E782 Mixed hyperlipidemia: Secondary | ICD-10-CM | POA: Diagnosis not present

## 2021-08-02 DIAGNOSIS — I350 Nonrheumatic aortic (valve) stenosis: Secondary | ICD-10-CM | POA: Diagnosis not present

## 2021-08-02 DIAGNOSIS — I152 Hypertension secondary to endocrine disorders: Secondary | ICD-10-CM | POA: Diagnosis not present

## 2021-08-02 DIAGNOSIS — I129 Hypertensive chronic kidney disease with stage 1 through stage 4 chronic kidney disease, or unspecified chronic kidney disease: Secondary | ICD-10-CM | POA: Diagnosis not present

## 2021-08-02 DIAGNOSIS — I2510 Atherosclerotic heart disease of native coronary artery without angina pectoris: Secondary | ICD-10-CM | POA: Diagnosis not present

## 2021-08-02 DIAGNOSIS — I5032 Chronic diastolic (congestive) heart failure: Secondary | ICD-10-CM | POA: Diagnosis not present

## 2021-08-02 DIAGNOSIS — N1831 Chronic kidney disease, stage 3a: Secondary | ICD-10-CM | POA: Diagnosis not present

## 2021-08-03 LAB — MICROALBUMIN / CREATININE URINE RATIO
Creatinine, Urine: 126.1 mg/dL
Microalb/Creat Ratio: 38 mg/g creat — ABNORMAL HIGH (ref 0–29)
Microalbumin, Urine: 47.8 ug/mL

## 2021-08-03 LAB — CBC WITH DIFFERENTIAL/PLATELET
Basophils Absolute: 0.1 10*3/uL (ref 0.0–0.2)
Basos: 1 %
EOS (ABSOLUTE): 0.2 10*3/uL (ref 0.0–0.4)
Eos: 3 %
Hematocrit: 38.8 % (ref 37.5–51.0)
Hemoglobin: 13.3 g/dL (ref 13.0–17.7)
Immature Grans (Abs): 0 10*3/uL (ref 0.0–0.1)
Immature Granulocytes: 1 %
Lymphocytes Absolute: 1.2 10*3/uL (ref 0.7–3.1)
Lymphs: 20 %
MCH: 32 pg (ref 26.6–33.0)
MCHC: 34.3 g/dL (ref 31.5–35.7)
MCV: 93 fL (ref 79–97)
Monocytes Absolute: 0.5 10*3/uL (ref 0.1–0.9)
Monocytes: 9 %
Neutrophils Absolute: 4 10*3/uL (ref 1.4–7.0)
Neutrophils: 66 %
Platelets: 137 10*3/uL — ABNORMAL LOW (ref 150–450)
RBC: 4.16 x10E6/uL (ref 4.14–5.80)
RDW: 12.6 % (ref 11.6–15.4)
WBC: 6 10*3/uL (ref 3.4–10.8)

## 2021-08-03 LAB — COMPREHENSIVE METABOLIC PANEL
ALT: 21 IU/L (ref 0–44)
AST: 19 IU/L (ref 0–40)
Albumin/Globulin Ratio: 1.8 (ref 1.2–2.2)
Albumin: 4.3 g/dL (ref 3.7–4.7)
Alkaline Phosphatase: 52 IU/L (ref 44–121)
BUN/Creatinine Ratio: 19 (ref 10–24)
BUN: 32 mg/dL — ABNORMAL HIGH (ref 8–27)
Bilirubin Total: 0.4 mg/dL (ref 0.0–1.2)
CO2: 20 mmol/L (ref 20–29)
Calcium: 10.6 mg/dL — ABNORMAL HIGH (ref 8.6–10.2)
Chloride: 103 mmol/L (ref 96–106)
Creatinine, Ser: 1.66 mg/dL — ABNORMAL HIGH (ref 0.76–1.27)
Globulin, Total: 2.4 g/dL (ref 1.5–4.5)
Glucose: 227 mg/dL — ABNORMAL HIGH (ref 65–99)
Potassium: 4.3 mmol/L (ref 3.5–5.2)
Sodium: 140 mmol/L (ref 134–144)
Total Protein: 6.7 g/dL (ref 6.0–8.5)
eGFR: 42 mL/min/{1.73_m2} — ABNORMAL LOW (ref >59)

## 2021-08-03 LAB — LIPID PANEL
Chol/HDL Ratio: 5.7 ratio — ABNORMAL HIGH (ref 0.0–5.0)
Cholesterol, Total: 178 mg/dL (ref 100–199)
HDL: 31 mg/dL — ABNORMAL LOW (ref >39)
LDL Chol Calc (NIH): 118 mg/dL — ABNORMAL HIGH (ref 0–99)
Triglycerides: 164 mg/dL — ABNORMAL HIGH (ref 0–149)
VLDL Cholesterol Cal: 29 mg/dL (ref 5–40)

## 2021-08-03 LAB — T4, FREE: Free T4: 1.22 ng/dL (ref 0.82–1.77)

## 2021-08-03 LAB — TSH: TSH: 3.54 u[IU]/mL (ref 0.450–4.500)

## 2021-08-07 ENCOUNTER — Other Ambulatory Visit: Payer: Self-pay | Admitting: *Deleted

## 2021-08-07 NOTE — Patient Outreach (Signed)
[facility]) Care Management  08/07/2021  Steven Maldonado 26-Mar-1941 [id number]   RN Health Coach attempted follow up outreach call to patient.  Patient was unavailable. HIPPA compliance voicemail message left with return callback number.  Plan: RN will call patient again within 30 days.  [place] Care Management [phone]

## 2021-08-12 ENCOUNTER — Encounter: Payer: Self-pay | Admitting: Endocrinology

## 2021-08-15 ENCOUNTER — Encounter (INDEPENDENT_AMBULATORY_CARE_PROVIDER_SITE_OTHER): Payer: Medicare Other | Admitting: Ophthalmology

## 2021-08-15 ENCOUNTER — Other Ambulatory Visit: Payer: Self-pay

## 2021-08-15 DIAGNOSIS — H353112 Nonexudative age-related macular degeneration, right eye, intermediate dry stage: Secondary | ICD-10-CM | POA: Diagnosis not present

## 2021-08-15 DIAGNOSIS — H43813 Vitreous degeneration, bilateral: Secondary | ICD-10-CM | POA: Diagnosis not present

## 2021-08-15 DIAGNOSIS — E113311 Type 2 diabetes mellitus with moderate nonproliferative diabetic retinopathy with macular edema, right eye: Secondary | ICD-10-CM | POA: Diagnosis not present

## 2021-08-15 DIAGNOSIS — E113392 Type 2 diabetes mellitus with moderate nonproliferative diabetic retinopathy without macular edema, left eye: Secondary | ICD-10-CM | POA: Diagnosis not present

## 2021-08-15 DIAGNOSIS — H35033 Hypertensive retinopathy, bilateral: Secondary | ICD-10-CM | POA: Diagnosis not present

## 2021-08-15 DIAGNOSIS — I10 Essential (primary) hypertension: Secondary | ICD-10-CM

## 2021-08-15 NOTE — Progress Notes (Addendum)
Cardiology Office Note    Date:  08/29/2021   ID:  Steven, Maldonado 01-Jan-1941, MRN [id number]   PCP:  [place], [place], [place]  Cardiologist:  [name], MD   Advanced Practice Provider:  No care team member to display Electrophysiologist:  Will Meredith Leeds, MD   [phone]   Chief Complaint  Patient presents with   Follow-up    History of Present Illness:  Steven Maldonado is a 80 y.o. male with history of CAD cath 12/2010.  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 fib ablation 12/10/2019 on recently switched from Pradaxa to Eliquis.  Amiodarone stopped and minimal A. fib since ablation.  Patient comes in alone today. Confused over his meds and asking [place] to call pharmacy to verify if he's on Eliquis or pradaxa.Denies chest pain, dyspnea, dizziness edema or syncope.    Past Medical History:  Diagnosis Date   Chronic diastolic CHF (congestive heart failure) (HCC)    CKD (chronic kidney disease), stage III (HCC)    Constipation    Coronary artery disease    a. Cath February 2012 in [facility] [facility], occluded RCA with collaterals   DM type 2 (diabetes mellitus, type 2) ([place])    Essential hypertension    Hyperlipidemia    Neuropathy    feet   Pacemaker    a. symptomatic brady after TAVR s/p MDT PPM by Dr. Curt Bears 12/04/17   Persistent atrial fibrillation (HCC)    PONV (postoperative nausea and vomiting)    after valve surgery   PVD (peripheral vascular disease) ([place])    a. s/p R popliteal artery stenosis tx with drug-coated balloon 05/2014, followed by Dr. Fletcher Anon.   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 12/02/17: s/p TAVR   Skin cancer    Sleep  apnea with use of continuous positive airway pressure (CPAP)    04-11-11 AHI was 32.9 and titrated to 15 cm H20, DME is [facility]   Subclinical hypothyroidism     Past Surgical History:  Procedure Laterality Date   ABDOMINAL ANGIOGRAM N/A 06/08/2014   Procedure: ABDOMINAL ANGIOGRAM;  Surgeon: Wellington Hampshire, MD;  Location: [place] [facility] CATH LAB;  Service: Cardiovascular;  Laterality: N/A;   ABDOMINAL AORTOGRAM N/A 04/09/2018   Procedure: ABDOMINAL AORTOGRAM;  Surgeon: Conrad Kings Mills, MD;  Location: [place] CV LAB;  Service: Cardiovascular;  Laterality: N/A;   ABDOMINAL AORTOGRAM W/LOWER EXTREMITY N/A 08/23/2020   Procedure: ABDOMINAL AORTOGRAM W/LOWER EXTREMITY;  Surgeon: Wellington Hampshire, MD;  Location: [place] CV LAB;  Service: Cardiovascular;  Laterality: N/A;   AMPUTATION Left 04/17/2018   Procedure: LEFT FOOT 3RD RAY AMPUTATION;  Surgeon: Newt Minion, MD;  Location: [place];  Service: Orthopedics;  Laterality: Left;   AMPUTATION Left 05/28/2018   Procedure: LEFT AMPUTATION BELOW KNEE;  Surgeon: Wylene Simmer, MD;  Location: [place];  Service: Orthopedics;  Laterality: Left;   [place] N/A 11/08/2020   Procedure: ATRIAL FIBRILLATION ABLATION;  Surgeon: Constance Haw, MD;  Location: [place] [facility];  Service: Cardiovascular;  Laterality: N/A;   BELOW KNEE LEG AMPUTATION Left 05/28/2018   CARDIAC CATHETERIZATION  12/2010   CARDIOVERSION  07/2011  CARDIOVERSION N/A 04/18/2014   Procedure: CARDIOVERSION;  Surgeon: Dorothy Spark, MD;  Location: [place];  Service: Cardiovascular;  Laterality: N/A;   CARDIOVERSION N/A 11/03/2015   Procedure: CARDIOVERSION;  Surgeon: Lelon Perla, MD;  Location: [place];  Service: Cardiovascular;  Laterality: N/A;   CARDIOVERSION N/A 05/08/2017   Procedure: CARDIOVERSION;  Surgeon: Dorothy Spark, MD;  Location: [place];  Service: Cardiovascular;  Laterality: N/A;   CARDIOVERSION N/A 07/28/2017    Procedure: CARDIOVERSION;  Surgeon: Dorothy Spark, MD;  Location: [facility] ENDOSCOPY;  Service: Cardiovascular;  Laterality: N/A;   CARDIOVERSION N/A 02/08/2020   Procedure: CARDIOVERSION;  Surgeon: Buford Dresser, MD;  Location: [facility] ENDOSCOPY;  Service: Cardiovascular;  Laterality: N/A;   CARDIOVERSION N/A 03/15/2020   Procedure: CARDIOVERSION;  Surgeon: Acie Fredrickson Wonda Cheng, MD;  Location: [facility] ENDOSCOPY;  Service: Cardiovascular;  Laterality: N/A;   CARDIOVERSION N/A 09/20/2020   Procedure: CARDIOVERSION;  Surgeon: Werner Lean, MD;  Location: [place];  Service: Cardiovascular;  Laterality: N/A;   LOWER EXTREMITY ANGIOGRAM N/A 06/08/2014   Procedure: LOWER EXTREMITY ANGIOGRAM;  Surgeon: Wellington Hampshire, MD;  Location: [facility] CATH LAB;  Service: Cardiovascular;  Laterality: N/A;   LOWER EXTREMITY ANGIOGRAPHY Left 04/09/2018   Procedure: Lower Extremity Angiography;  Surgeon: Conrad Plainview, MD;  Location: [place] CV LAB;  Service: Cardiovascular;  Laterality: Left;   PACEMAKER IMPLANT N/A 12/04/2017   Procedure: PACEMAKER IMPLANT;  Surgeon: Constance Haw, MD;  Location: [place] CV LAB;  Service: Cardiovascular;  Laterality: N/A;   PERIPHERAL VASCULAR ATHERECTOMY  08/23/2020   Procedure: PERIPHERAL VASCULAR ATHERECTOMY;  Surgeon: Wellington Hampshire, MD;  Location: [place] CV LAB;  Service: Cardiovascular;;   PERIPHERAL VASCULAR BALLOON ANGIOPLASTY Left 04/09/2018   Procedure: PERIPHERAL VASCULAR BALLOON ANGIOPLASTY;  Surgeon: Conrad Lake Lure, MD;  Location: [place] CV LAB;  Service: Cardiovascular;  Laterality: Left;  SFA   POPLITEAL ARTERY ANGIOPLASTY Right 06/08/2014   Archie Endo 06/08/2014   RIGHT/LEFT HEART CATH AND CORONARY ANGIOGRAPHY N/A 10/08/2017   Procedure: RIGHT/LEFT HEART CATH AND CORONARY ANGIOGRAPHY;  Surgeon: Burnell Blanks, MD;  Location: [place] CV LAB;  Service: Cardiovascular;  Laterality: N/A;   SKIN CANCER EXCISION Bilateral    "have had  them cut off back of neck X 2; off left upper arm; right wrist, near right shoulder blade" (06/08/2014)   TEE WITHOUT CARDIOVERSION N/A 12/02/2017   Procedure: TRANSESOPHAGEAL ECHOCARDIOGRAM (TEE);  Surgeon: Burnell Blanks, MD;  Location: [place];  Service: Open Heart Surgery;  Laterality: N/A;   TEMPORARY PACEMAKER N/A 12/04/2017   Procedure: TEMPORARY PACEMAKER;  Surgeon: Leonie Man, MD;  Location: [place] CV LAB;  Service: Cardiovascular;  Laterality: N/A;   TRANSCATHETER AORTIC VALVE REPLACEMENT, TRANSFEMORAL N/A 12/02/2017   Procedure: TRANSCATHETER AORTIC VALVE REPLACEMENT, TRANSFEMORAL;  Surgeon: Burnell Blanks, MD;  Location: [place];  Service: Open Heart Surgery;  Laterality: N/A;  using Edwards Sapien 3 Transcatheter Heart Valve size 44mm    Current Medications: Current Meds  Medication Sig   atorvastatin (LIPITOR) 40 MG tablet Take 1 tablet (40 mg total) by mouth daily.   Blood Glucose Monitoring Suppl (FREESTYLE LITE) DEVI 1 each by Does not apply route 2 (two) times daily.   Choline Fenofibrate (FENOFIBRIC ACID) 135 MG CPDR TAKE 1 CAPSULE BY MOUTH EVERY DAY   CLICKFINE PEN NEEDLES 32G X 4 MM MISC USE FOUR TIMES DAILY FOR DIABETES   docusate sodium (COLACE) 100 MG capsule Take 300 mg by  mouth daily.    ezetimibe (ZETIA) 10 MG tablet Take 1 tablet (10 mg total) by mouth daily.   FREESTYLE LITE test strip 1 EACH BY OTHER ROUTE 4 (FOUR) TIMES DAILY - BEFORE MEALS AND AT BEDTIME.   furosemide (LASIX) 20 MG tablet Take 1 tablet (20 mg total) by mouth every other day.   glipiZIDE (GLUCOTROL XL) 5 MG 24 hr tablet Take 1 tablet (5 mg total) by mouth daily with breakfast.   insulin lispro (HUMALOG KWIKPEN) 100 UNIT/ML KwikPen Inject 5-8 Units into the skin See admin instructions. Inject 5-8 units under the skin up to three times daily if eating sweets or high carb foods.   levothyroxine (SYNTHROID) 200 MCG tablet TAKE 1 TABLET BY MOUTH DAILY MONDAY-SATURDAY  TAKE 1/2  TABLET ON SUNDAY   metoprolol tartrate (LOPRESSOR) 50 MG tablet TAKE ONE AND ONE-HALF TABLET BY MOUTH TWICE A DAY   mirabegron ER (MYRBETRIQ) 25 MG TB24 tablet Take 1 tablet (25 mg total) by mouth daily.   mupirocin ointment (BACTROBAN) 2 % Apply to great toe wound daily, with gauze and tape   Semaglutide,0.25 or 0.5MG /DOS, (OZEMPIC, 0.25 OR 0.5 MG/DOSE,) 2 MG/1.5ML SOPN Inject 0.5 mg into the skin once a week.   TRESIBA FLEXTOUCH 200 UNIT/ML FlexTouch Pen INJECT SUBCUTANEOUSLY 26   UNITS AT BEDTIME   [DISCONTINUED] apixaban (ELIQUIS) 5 MG TABS tablet Take 1 tablet (5 mg total) by mouth 2 (two) times daily.     Allergies:   Patient has no known allergies.   Social History   Socioeconomic History   Marital status: Married    Spouse name: [name]   Number of children: 1   Years of education: 12   Highest education level: Not on file  Occupational History   Occupation: retired    Fish farm manager: [employer]  Tobacco Use   Smoking status: Former    Packs/day: 2.00    Years: 14.00    Pack years: 28.00    Types: Cigarettes    Quit date: 11/11/1972    Years since quitting: 48.8   Smokeless tobacco: Never  Vaping Use   Vaping Use: Never used  Substance and Sexual Activity   Alcohol use: No    Comment: quit in 1984   Drug use: No   Sexual activity: Not Currently  Other Topics Concern   Not on file  Social History Narrative   Patient is married Engineer, drilling) and lives at home with his wife.   Patient has one child and his wife has one child.   Patient is retired.   Patient has a high school education.   Patient is right-handed.   Patient drinks very little caffeine.   Social Determinants of Health   Financial Resource Strain: Not on file  Food Insecurity: No Food Insecurity   Worried About Charity fundraiser in the Last Year: Never true   Ran Out of Food in the Last Year: Never true  Transportation Needs: No Transportation Needs   Lack of Transportation (Medical): No   Lack  of Transportation (Non-Medical): No  Physical Activity: Not on file  Stress: Not on file  Social Connections: Not on file     Family History:  The patient's  family history includes Diabetes in his brother, daughter, father, mother, sister, and another family member; Heart Problems in his daughter; Heart attack in his father and mother; Heart failure in his father; Hypertension in his brother, father, mother, and sister; Stroke in his brother.   ROS:  Please see the history of present illness.    ROS All other systems reviewed and are negative.   PHYSICAL EXAM:   VS:  BP 120/60   Pulse 100   Ht 5\' 11"  (1.803 m)   Wt 241 lb 12.8 oz (109.7 kg)   SpO2 98%   BMI 33.72 kg/m   Physical Exam  GEN: obese, in no acute distress  Neck: no JVD, carotid bruits, or masses Cardiac:RRR; no murmurs, rubs, or gallops  Respiratory:  clear to auscultation bilaterally, normal work of breathing GI: soft, nontender, nondistended, + BS Ext: L below the knee amputations, minimal swelling on right , Good distal pulses bilaterally Neuro:  Alert and Oriented x 3 Psych: euthymic mood, full affect  Wt Readings from Last 3 Encounters:  08/29/21 241 lb 12.8 oz (109.7 kg)  08/16/21 235 lb (106.6 kg)  07/03/21 232 lb (105.2 kg)      Studies/Labs Reviewed:   EKG:  EKG is not ordered today.     Recent Labs: 08/02/2021: ALT 21; BUN 32; Creatinine, Ser 1.66; Hemoglobin 13.3; Platelets 137; Potassium 4.3; Sodium 140; TSH 3.540   Lipid Panel    Component Value Date/Time   CHOL 178 08/02/2021 1038   TRIG 164 (H) 08/02/2021 1038   HDL 31 (L) 08/02/2021 1038   CHOLHDL 5.7 (H) 08/02/2021 1038   CHOLHDL 4 03/01/2021 1338   VLDL 28.4 03/01/2021 1338   LDLCALC 118 (H) 08/02/2021 1038   LDLCALC 142 (H) 05/31/2019 1246   LDLDIRECT 123.0 12/03/2019 1136    Additional studies/ records that were reviewed today include:  Echo 11/2020 Study Conclusions   - Left ventricle: The cavity size was normal. There  was moderate    concentric hypertrophy. Systolic function was normal. The    estimated ejection fraction was in the range of 60% to 65%. Wall    motion was normal; there were no regional wall motion    abnormalities. Doppler parameters are consistent with abnormal    left ventricular relaxation (grade 1 diastolic dysfunction).    Doppler parameters are consistent with elevated ventricular    end-diastolic filling pressure.  - Aortic valve: S/P TAVR with a 29 mm Edwards-SAPIEN 3 valve. Mean    gradient (S): 12 mm Hg. Peak gradient (S): 25 mm Hg. Valve area    (VTI): 1.6 cm^2. Valve area (Vmax): 1.26 cm^2. Valve area    (Vmean): 1.42 cm^2.  - Mitral valve: There was mild regurgitation.  - Left atrium: The atrium was mildly dilated.  - Right ventricle: Systolic function was normal.  - Tricuspid valve: There was no regurgitation.   Impressions:   - Since the last study on 12/24/2017 transaortic gradients are    stable and normal, there is no paravalvular leak.   TTE 12/24/17  Review of the above records today demonstrates:  - Left ventricle: Inferobasal hypokinesis The cavity size was   normal. Wall thickness was increased in a pattern of severe LVH.   Systolic function was normal. The estimated ejection fraction was   in the range of 55% to 60%. - Aortic valve: Post TAVR with no significant peri valvualr   regurgitation gradients have increased since 12/03/17. - Mitral valve: Severely calcified annulus. Moderately thickened,   moderately calcified leaflets . There was mild regurgitation. - Left atrium: The atrium was moderately dilated. - Atrial septum: No defect or patent foramen ovale was identified.     Risk Assessment/Calculations:    CHA2DS2-VASc Score = 6   This indicates  a 9.7% annual risk of stroke. The patient's score is based upon: CHF History: 1 HTN History: 1 Diabetes History: 1 Stroke History: 0 Vascular Disease History: 1 Age Score: 2 Gender Score: 0        ASSESSMENT:    1. S/P TAVR (transcatheter aortic valve replacement)   2. Cardiac pacemaker in situ   3. Persistent atrial fibrillation ([place])   4. Essential hypertension   5. Coronary artery disease involving native coronary artery of native heart without angina pectoris   6. Hyperlipidemia, unspecified hyperlipidemia type   7. Obstructive sleep apnea syndrome      PLAN:  In order of problems listed above:  Severe AS S/P TAVR valve stable on echo 11/2018, SBE prophylaxis   Complete heart block: Status post Medtronic dual-chamber pacemaker implanted 12/04/2017.  followed by Dr. Curt Bears     Persistent atrial fibrillation: Pradaxa switched to Eliquis  but patient never picked up his Rx. Patient says he got upset with waiting at the pharmacy and left without Rx. We called pharmacy and [facility] and he hasn't gotten anything filled since April. Not sure how long he's been out of Pradaxa. I stressed the importance of compliance. He'll get Eliquis filled today. Offered pill packets but he declined. CHA2DS2-VASc of 4.  Status post ablation 11/08/2020.  Amiodarone was stopped     Hypertension: well controlled   Coronary artery disease: No current chest pain   Hyperlipidemia: Continue statin and zetia added by Dr. Dwyane Dee for LDL 118 07/2021   Obstructive sleep apnea: uses CPAP but not regularly because of skin caner treatment on his head. compliance encouraged      Shared Decision Making/Informed Consent        Medication Adjustments/Labs and Tests Ordered: Current medicines are reviewed at length with the patient today.  Concerns regarding medicines are outlined above.  Medication changes, Labs and Tests ordered today are listed in the Patient Instructions below. Patient Instructions  Medication Instructions:  Your physician recommends that you continue on your current medications as directed. Please refer to the Current Medication list given to you today.  *If you need a refill  on your cardiac medications before your next appointment, please call your pharmacy*   Lab Work: None If you have labs (blood work) drawn today and your tests are completely normal, you will receive your results only by: [place] (if you have MyChart) OR A paper copy in the mail If you have any lab test that is abnormal or we need to change your treatment, we will call you to review the results.   Follow-Up: At [facility], you and your health needs are our priority.  As part of our continuing mission to provide you with exceptional heart care, we have created designated Provider Care Teams.  These Care Teams include your primary Cardiologist (physician) and Advanced Practice Providers (APPs -  Physician Assistants and Nurse Practitioners) who all work together to provide you with the care you need, when you need it.  We recommend signing up for the patient portal called "MyChart".  Sign up information is provided on this After Visit Summary.  MyChart is used to connect with patients for Virtual Visits (Telemedicine).  Patients are able to view lab/test results, encounter notes, upcoming appointments, etc.  Non-urgent messages can be sent to your provider as well.   To learn more about what you can do with MyChart, go to [email or web address].    Your next appointment:   6 month(s)  The format  for your next appointment:   In Person  Provider:   You may see [name], MD or one of the following Advanced Practice Providers on your designated Care Team:   Melina Copa, [name] Ermalinda Barrios, [name]     Signed, Ermalinda Barrios, [name]  08/29/2021 1:05 PM    [address] [place], [place], [place]  [address] Phone: [phone]; Fax: [phone]

## 2021-08-16 ENCOUNTER — Ambulatory Visit (INDEPENDENT_AMBULATORY_CARE_PROVIDER_SITE_OTHER): Payer: Medicare Other | Admitting: Endocrinology

## 2021-08-16 VITALS — BP 142/72 | HR 80 | Ht 71.0 in | Wt 235.0 lb

## 2021-08-16 DIAGNOSIS — E039 Hypothyroidism, unspecified: Secondary | ICD-10-CM

## 2021-08-16 DIAGNOSIS — I2510 Atherosclerotic heart disease of native coronary artery without angina pectoris: Secondary | ICD-10-CM | POA: Diagnosis not present

## 2021-08-16 DIAGNOSIS — Z794 Long term (current) use of insulin: Secondary | ICD-10-CM | POA: Diagnosis not present

## 2021-08-16 DIAGNOSIS — E782 Mixed hyperlipidemia: Secondary | ICD-10-CM | POA: Diagnosis not present

## 2021-08-16 DIAGNOSIS — E1165 Type 2 diabetes mellitus with hyperglycemia: Secondary | ICD-10-CM

## 2021-08-16 LAB — POCT GLUCOSE (DEVICE FOR HOME USE)
Glucose Fasting, POC: 56 mg/dL — AB (ref 70–99)
POC Glucose: 64 mg/dl — AB (ref 70–99)

## 2021-08-16 MED ORDER — ATORVASTATIN CALCIUM 40 MG PO TABS: 40.0000 mg | ORAL_TABLET | Freq: Every day | ORAL | 3 refills | Status: DC

## 2021-08-16 MED ORDER — GLIPIZIDE ER 5 MG PO TB24: 5.0000 mg | ORAL_TABLET | Freq: Every day | ORAL | 1 refills | Status: DC

## 2021-08-16 NOTE — Patient Instructions (Addendum)
DIABETES management   CHECKING blood sugar: Need to start checking blood sugars 4 times a day which would be before each meal and at bedtime. Blood sugar needs to be preferably between 90-130 when you wake up and under 180 after eating HUMALOG insulin: Take 5 units only when eating significant amount of starchy foods or large meals Important to take the Humalog ONLY when you start eating and not more than 15 minutes after finishing a meal GLIPIZIDE will be reduced to 5 mg TRESIBA will be continued at 18 units daily Please call us if you are having low blood sugars overnight  CHOLESTEROL/lipid management  Start taking ATORVASTATIN 40 mg daily along with ezetimibe 10 mg FENOFIBRATE will be reduced to 3 days a week, Monday/Wednesday/Friday instead of every day  THYROID: Continue same dose of 200 mcg before breakfast daily for 6 days and half tablet on Sundays

## 2021-08-16 NOTE — Progress Notes (Signed)
Patient ID: Steven Maldonado, male   DOB: Dec 01, 1940, 80 y.o.   MRN: [id number]          Reason for Appointment: Endocrinology follow-up     History of Present Illness:     DIABETES:       Date of diagnosis of type 2 diabetes mellitus:  1999      Background history:   He had been treated with metformin and glipizide for several years Metformin was stopped about 4 years ago because of renal dysfunction Presumably because of poor control he was started on insulin around the year 2009 He had been mostly on Lantus which was subsequently changed to [employer] and he thinks that Lantus worked better Also has been on Humalog 3-4 times a day for some time  Recent history:    INSULIN regimen is:  TRESIBA 16 or?18 at bedtime, Humalog 5 units periodically  Non-insulin hypoglycemic drugs the patient is taking are: on Ozempic 0.5 mg weekly, glipizide ER 10 mg daily  Most recent A1c is 7.2  Current management, blood sugar patterns and problems identified:  He has not had any recent labs to objectively assess his diabetes  Also he is monitoring his blood sugars very infrequently even with the freestyle libre and mostly in the morning  Patient was hypoglycemic in the office today and not able to give a reliable history  He is not sure whether he increase his [place] to 18 units as recommended on the last visit  Overall his blood sugars overnight are still relatively high but had normal readings this morning from getting hypoglycemic at bedtime but also appears to have had a low sugar overnight on 1 night  He is unclear on when and how much Humalog he is taking  Last night when blood sugar was 166 after supper he took 5 units of Humalog because he hypoglycemia  No postprandial readings available except once when blood sugar was significantly high after dinner Today he was fasting because he thought he may need lab work and had not eaten breakfast or lunch causing his sugar to be low with his  glipizide     Side effects from medications have been: None     Meal times are:  Breakfast is at 8 AM, dinnertime 5-6 PM  Freestyle [place] data:  Only overnight blood sugar data available with average 172 at midnight and 140-149 between 2 AM and 9 AM  CGM use % of time   2-week average/GV 151  Time in range       68%  % Time Above 180 17+5  % Time above 250   % Time Below 70 9     Previous data:  CGM use % of time 46  2-week average/GV 149  Time in range    74    %  % Time Above 180 22  % Time above 250   % Time Below 70 4     PRE-MEAL Fasting Lunch Dinner Bedtime Overall  Glucose range:       Averages: 171 124 166     POST-MEAL PC Breakfast PC Lunch PC Dinner  Glucose range:     Averages: 174 153 152     Dietician visit, most recent: 9/19  Weight history:  Wt Readings from Last 3 Encounters:  08/16/21 235 lb (106.6 kg)  07/03/21 232 lb (105.2 kg)  07/02/21 232 lb (105.2 kg)    Glycemic control:   Lab Results  Component Value Date  HGBA1C 7.2 (A) 07/02/2021   HGBA1C 7.1 (A) 05/15/2021   HGBA1C 9.2 (H) 03/01/2021   Lab Results  Component Value Date   MICROALBUR 10 05/25/2020   LDLCALC 118 (H) 08/02/2021   CREATININE 1.66 (H) 08/02/2021   Lab Results  Component Value Date   MICRALBCREAT 38 (H) 08/02/2021    Lab Results  Component Value Date   FRUCTOSAMINE 268 06/26/2018   HYPOTHYROIDISM and other problems: See review of systems   Abstract on 08/12/2021  Component Date Value Ref Range Status   HM Diabetic Eye Exam 06/14/2021 Retinopathy (A) No Retinopathy Final    Allergies as of 08/16/2021   No Known Allergies      Medication List        Accurate as of August 16, 2021  2:31 PM. If you have any questions, ask your nurse or doctor.          apixaban 5 MG Tabs tablet Commonly known as: ELIQUIS Take 1 tablet (5 mg total) by mouth 2 (two) times daily.   atorvastatin 40 MG tablet Commonly known as: LIPITOR Take 1 tablet (40  mg total) by mouth daily.   Clickfine Pen Needles 32G X 4 MM Misc Generic drug: Insulin Pen Needle USE FOUR TIMES DAILY FOR DIABETES   docusate sodium 100 MG capsule Commonly known as: COLACE Take 300 mg by mouth daily.   ezetimibe 10 MG tablet Commonly known as: Zetia Take 1 tablet (10 mg total) by mouth daily.   Fenofibric Acid 135 MG Cpdr TAKE 1 CAPSULE BY MOUTH EVERY DAY   FreeStyle Lite Devi 1 each by Does not apply route 2 (two) times daily.   FREESTYLE LITE test strip Generic drug: glucose blood 1 EACH BY OTHER ROUTE 4 (FOUR) TIMES DAILY - BEFORE MEALS AND AT BEDTIME.   furosemide 20 MG tablet Commonly known as: LASIX Take 1 tablet (20 mg total) by mouth every other day.   glipiZIDE 10 MG 24 hr tablet Commonly known as: GLUCOTROL XL Take 1 tablet (10 mg total) by mouth daily with breakfast.   insulin lispro 100 UNIT/ML KwikPen Commonly known as: HumaLOG KwikPen Inject 5-8 Units into the skin See admin instructions. Inject 5-8 units under the skin up to three times daily if eating sweets or high carb foods.   levothyroxine 200 MCG tablet Commonly known as: SYNTHROID TAKE 1 TABLET BY MOUTH DAILY MONDAY-SATURDAY  TAKE 1/2 TABLET ON SUNDAY   metoprolol tartrate 50 MG tablet Commonly known as: LOPRESSOR TAKE ONE AND ONE-HALF TABLET BY MOUTH TWICE A DAY   mirabegron ER 25 MG Tb24 tablet Commonly known as: MYRBETRIQ Take 1 tablet (25 mg total) by mouth daily.   mupirocin ointment 2 % Commonly known as: BACTROBAN Apply to great toe wound daily, with gauze and tape   Ozempic (0.25 or 0.5 MG/DOSE) 2 MG/1.5ML Sopn Generic drug: Semaglutide(0.25 or 0.5MG /DOS) Inject 0.5 mg into the skin once a week.   [name] FlexTouch 200 UNIT/ML FlexTouch Pen Generic drug: insulin degludec INJECT SUBCUTANEOUSLY 26   UNITS AT BEDTIME        Allergies: No Known Allergies  Past Medical History:  Diagnosis Date   Chronic diastolic CHF (congestive heart failure) (HCC)     CKD (chronic kidney disease), stage III (HCC)    Constipation    Coronary artery disease    a. Cath February 2012 in [place] [facility], occluded RCA with collaterals   DM type 2 (diabetes mellitus, type 2) ([place])    Essential hypertension  Hyperlipidemia    Neuropathy    feet   Pacemaker    a. symptomatic brady after TAVR s/p MDT PPM by Dr. Curt Bears 12/04/17   Persistent atrial fibrillation (HCC)    PONV (postoperative nausea and vomiting)    after valve surgery   PVD (peripheral vascular disease) ([place])    a. s/p R popliteal artery stenosis tx with drug-coated balloon 05/2014, followed by Dr. Fletcher Anon.   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 12/02/17: s/p TAVR   Skin cancer    Sleep apnea with use of continuous positive airway pressure (CPAP)    04-11-11 AHI was 32.9 and titrated to 15 cm H20, DME is [facility]   Subclinical hypothyroidism     Past Surgical History:  Procedure Laterality Date   ABDOMINAL ANGIOGRAM N/A 06/08/2014   Procedure: ABDOMINAL ANGIOGRAM;  Surgeon: Wellington Hampshire, MD;  Location: [place] CATH LAB;  Service: Cardiovascular;  Laterality: N/A;   ABDOMINAL AORTOGRAM N/A 04/09/2018   Procedure: ABDOMINAL AORTOGRAM;  Surgeon: Conrad [place], MD;  Location: [place] CV LAB;  Service: Cardiovascular;  Laterality: N/A;   ABDOMINAL AORTOGRAM W/LOWER EXTREMITY N/A 08/23/2020   Procedure: ABDOMINAL AORTOGRAM W/LOWER EXTREMITY;  Surgeon: Wellington Hampshire, MD;  Location: [place] CV LAB;  Service: Cardiovascular;  Laterality: N/A;   AMPUTATION Left 04/17/2018   Procedure: LEFT FOOT 3RD RAY AMPUTATION;  Surgeon: Newt Minion, MD;  Location: [place];  Service: Orthopedics;  Laterality: Left;   AMPUTATION Left 05/28/2018   Procedure: LEFT AMPUTATION BELOW KNEE;  Surgeon: Wylene Simmer, MD;  Location: [place];  Service: Orthopedics;  Laterality:  Left;   [place] N/A 11/08/2020   Procedure: ATRIAL FIBRILLATION ABLATION;  Surgeon: Constance Haw, MD;  Location: [place] CV LAB;  Service: Cardiovascular;  Laterality: N/A;   BELOW KNEE LEG AMPUTATION Left 05/28/2018   CARDIAC CATHETERIZATION  12/2010   CARDIOVERSION  07/2011   CARDIOVERSION N/A 04/18/2014   Procedure: CARDIOVERSION;  Surgeon: Dorothy Spark, MD;  Location: [place];  Service: Cardiovascular;  Laterality: N/A;   CARDIOVERSION N/A 11/03/2015   Procedure: CARDIOVERSION;  Surgeon: Lelon Perla, MD;  [facility] ENDOSCOPY;  Service: Cardiovascular;  Laterality: N/A;   CARDIOVERSION N/A 05/08/2017   Procedure: CARDIOVERSION;  Surgeon: Dorothy Spark, MD;  Location: [place];  Service: Cardiovascular;  Laterality: N/A;   CARDIOVERSION N/A 07/28/2017   Procedure: CARDIOVERSION;  Surgeon: Dorothy Spark, MD;  Location: [facility] ENDOSCOPY;  Service: Cardiovascular;  Laterality: N/A;   CARDIOVERSION N/A 02/08/2020   Procedure: CARDIOVERSION;  Surgeon: Buford Dresser, MD;  Location: [facility] ENDOSCOPY;  Service: Cardiovascular;  Laterality: N/A;   CARDIOVERSION N/A 03/15/2020   Procedure: CARDIOVERSION;  Surgeon: Acie Fredrickson Wonda Cheng, MD;  Location: [facility] ENDOSCOPY;  Service: Cardiovascular;  Laterality: N/A;   CARDIOVERSION N/A 09/20/2020   Procedure: CARDIOVERSION;  Surgeon: Werner Lean, MD;  Location: [place];  Service: Cardiovascular;  Laterality: N/A;   LOWER EXTREMITY ANGIOGRAM N/A 06/08/2014   Procedure: LOWER EXTREMITY ANGIOGRAM;  Surgeon: Wellington Hampshire, MD;  Location: [place] CATH LAB;  Service: Cardiovascular;  Laterality: N/A;   LOWER EXTREMITY ANGIOGRAPHY Left 04/09/2018   Procedure: Lower Extremity Angiography;  Surgeon: Conrad [place], MD;  Location: [place] CV LAB;  Service: Cardiovascular;  Laterality: Left;   PACEMAKER IMPLANT N/A  12/04/2017   Procedure: PACEMAKER IMPLANT;  Surgeon: Constance Haw, MD;  Location: [place] CV LAB;  Service: Cardiovascular;  Laterality: N/A;   PERIPHERAL VASCULAR ATHERECTOMY  08/23/2020   Procedure: PERIPHERAL VASCULAR ATHERECTOMY;  Surgeon: Wellington Hampshire, MD;  Location: [place] CV LAB;  Service: Cardiovascular;;   PERIPHERAL VASCULAR BALLOON ANGIOPLASTY Left 04/09/2018   Procedure: PERIPHERAL VASCULAR BALLOON ANGIOPLASTY;  Surgeon: Conrad [place], MD;  Location: [place] CV LAB;  Service: Cardiovascular;  Laterality: Left;  SFA   POPLITEAL ARTERY ANGIOPLASTY Right 06/08/2014   Archie Endo 06/08/2014   RIGHT/LEFT HEART CATH AND CORONARY ANGIOGRAPHY N/A 10/08/2017   Procedure: RIGHT/LEFT HEART CATH AND CORONARY ANGIOGRAPHY;  Surgeon: Burnell Blanks, MD;  Location: [place] CV LAB;  Service: Cardiovascular;  Laterality: N/A;   SKIN CANCER EXCISION Bilateral    "have had them cut off back of neck X 2; off left upper arm; right wrist, near right shoulder blade" (06/08/2014)   TEE WITHOUT CARDIOVERSION N/A 12/02/2017   Procedure: TRANSESOPHAGEAL ECHOCARDIOGRAM (TEE);  Surgeon: Burnell Blanks, MD;  Location: [place];  Service: Open Heart Surgery;  Laterality: N/A;   TEMPORARY PACEMAKER N/A 12/04/2017   Procedure: TEMPORARY PACEMAKER;  Surgeon: Leonie Man, MD;  Location: [place] CV LAB;  Service: Cardiovascular;  Laterality: N/A;   TRANSCATHETER AORTIC VALVE REPLACEMENT, TRANSFEMORAL N/A 12/02/2017   Procedure: TRANSCATHETER AORTIC VALVE REPLACEMENT, TRANSFEMORAL;  Surgeon: Burnell Blanks, MD;  Location: [place];  Service: Open Heart Surgery;  Laterality: N/A;  using Edwards Sapien 3 Transcatheter Heart Valve size 59mm    Family History  Problem Relation Age of Onset   Diabetes Mother    Heart attack Mother    Hypertension Mother    Heart attack Father    Heart failure Father    Hypertension Father    Diabetes Father    Diabetes Sister    Diabetes Brother    Diabetes Other    Diabetes Daughter        TYPE ll    Heart Problems Daughter    Hypertension Sister    Hypertension Brother    Stroke Brother     Social History:  reports that he quit smoking about 48 years ago. His smoking use included cigarettes. He has a 28.00 pack-year smoking history. He has never used smokeless tobacco. He reports that he does not drink alcohol and does not use drugs.   Review of Systems   Lipid history: He is supposed to be on 40  mg atorvastatin and ezetimibe  He is likely taking Zetia but since his LDL is higher may not be taking his atorvastatin Triglycerides are slightly higher but may have been nonfasting  He has history of CAD    Lab Results  Component Value Date   CHOL 178 08/02/2021   CHOL 158 03/01/2021   CHOL 209 (H) 11/30/2020   Lab Results  Component Value Date   HDL 31 (L) 08/02/2021   HDL 35.80 (L) 03/01/2021   HDL 38.20 (L) 11/30/2020   Lab Results  Component Value Date   LDLCALC 118 (H) 08/02/2021   LDLCALC 94 03/01/2021   LDLCALC 145 (H) 11/30/2020   Lab Results  Component Value Date   TRIG 164 (H) 08/02/2021   TRIG 142.0 03/01/2021   TRIG 133.0 11/30/2020   Lab Results  Component Value Date   CHOLHDL 5.7 (H) 08/02/2021   CHOLHDL 4 03/01/2021   CHOLHDL 5 11/30/2020   Lab Results  Component  Value Date   LDLDIRECT 123.0 12/03/2019   LDLDIRECT 77.0 08/18/2018            Hypertension: Has been controlled with only antihypertensive being metoprolol, not on ACE inhibitor  BP Readings from Last 3 Encounters:  08/16/21 (!) 142/72  07/03/21 131/69  07/02/21 108/65    Most recent eye exam was in 12/18 ?  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 History of symptomatic neuropathy  He has chronic kidney disease of unclear etiology, is followed by nephrologist but no records available   Lab Results  Component Value Date   CREATININE 1.66 (H) 08/02/2021   CREATININE 1.66 (H) 03/01/2021   CREATININE 1.85 (H) 11/30/2020       Physical Examination:  BP (!) 142/72   Pulse 80   Ht 5\' 11"  (1.803 m)   Wt 235 lb (106.6 kg)   SpO2 98%   BMI 32.78 kg/m          ASSESSMENT:  Diabetes type 2, insulin requiring with moderate obesity  See history of present illness for detailed discussion of current diabetes management, blood sugar patterns and problems identified  Currently on a regimen of basal bolus insulin with Ozempic 0.5 mg  A1c is still fairly good at 7.2  Without adequate glucose monitoring difficult to know what his level of control is Although he is slightly benefiting from [place] and needing less mealtime coverage his overnight blood sugars are somewhat variable He is not understanding the need to take Humalog for higher carbohydrate meals only and make sure he takes the injection before eating He had hypoglycemia last night from taking extra Humalog 3 hours after eating Also today fasted all day and blood sugar was low from his taking his glipizide Hypoglycemia was treated with juice and peanut butter crackers and he symptomatically got better with glucose 64 at the time of discharge   Primary hypothyroidism longstanding has been on 200 mcg levothyroxine, 6-1/2 days a week  Taking this regularly and TSH is within normal  LIPIDS:  Not well controlled especially LDL and not clear if he is compliant with atorvastatin although likely taking Zetia and fenofibrate   PLAN:   Continue 0.5 mg Ozempic weekly Detailed instructions given on medications Written information on his visit summary was reviewed with the patient Will also have  been referred to the diabetes educator  DIABETES management   CHECKING blood sugar: Need to start checking blood sugars 4 times a day which would be before each meal and at bedtime. Blood sugar needs to be preferably between 90-130 when you wake up and under 180 after eating HUMALOG insulin: Take 5 units only when eating significant amount of starchy foods or large meals Important to take the Humalog ONLY when you start eating and not more than 15 minutes after finishing a meal GLIPIZIDE will be reduced to 5 mg TRESIBA will be continued at 18 units daily Please call us if you are having low blood sugars overnight  CHOLESTEROL/lipid management  Start taking ATORVASTATIN 40 mg daily along with ezetimibe 10  mg FENOFIBRATE will be reduced to 3 days a week, Monday/Wednesday/Friday instead of every day  THYROID: Continue same dose of 200 mcg before breakfast daily for 6 days and half tablet on Sundays  Patient Instructions  DIABETES management   CHECKING blood sugar: Need to start checking blood sugars 4 times a day which would be before each meal and at bedtime. Blood sugar needs to be preferably between 90-130 when you wake up and under 180 after eating HUMALOG insulin: Take 5 units only when eating significant amount of starchy foods or large meals Important to take the Humalog ONLY when you start eating and not more than 15 minutes after finishing a meal GLIPIZIDE will be reduced to 5 mg TRESIBA will be continued at 18 units daily Please call us if you are having low blood sugars overnight  CHOLESTEROL/lipid management  Start taking ATORVASTATIN 40 mg daily along with ezetimibe 10 mg FENOFIBRATE will be reduced to 3 days a week, Monday/Wednesday/Friday instead of every day  THYROID: Continue same dose of 200 mcg before breakfast daily for 6 days and half tablet on Sundays   Total visit time including counseling 30 Minutes  Elayne Snare 08/16/2021, 2:31 PM   Note: This office  note was prepared with Dragon voice recognition system technology. Any transcriptional errors that result from this process are unintentional.

## 2021-08-16 NOTE — Addendum Note (Signed)
Addended by: Cinda Quest on: 08/16/2021 03:33 PM   Modules accepted: Orders

## 2021-08-23 DIAGNOSIS — H6123 Impacted cerumen, bilateral: Secondary | ICD-10-CM | POA: Diagnosis not present

## 2021-08-29 ENCOUNTER — Encounter: Payer: Self-pay | Admitting: Physician Assistant

## 2021-08-29 ENCOUNTER — Telehealth: Payer: Self-pay | Admitting: Pharmacist

## 2021-08-29 ENCOUNTER — Other Ambulatory Visit: Payer: Self-pay

## 2021-08-29 ENCOUNTER — Ambulatory Visit (INDEPENDENT_AMBULATORY_CARE_PROVIDER_SITE_OTHER): Payer: Medicare Other | Admitting: Physician Assistant

## 2021-08-29 VITALS — BP 120/60 | HR 100 | Ht 71.0 in | Wt 241.8 lb

## 2021-08-29 DIAGNOSIS — I4819 Other persistent atrial fibrillation: Secondary | ICD-10-CM

## 2021-08-29 DIAGNOSIS — Z952 Presence of prosthetic heart valve: Secondary | ICD-10-CM | POA: Diagnosis not present

## 2021-08-29 DIAGNOSIS — I10 Essential (primary) hypertension: Secondary | ICD-10-CM

## 2021-08-29 DIAGNOSIS — Z950 Presence of cardiac pacemaker: Secondary | ICD-10-CM | POA: Diagnosis not present

## 2021-08-29 DIAGNOSIS — I2510 Atherosclerotic heart disease of native coronary artery without angina pectoris: Secondary | ICD-10-CM | POA: Diagnosis not present

## 2021-08-29 DIAGNOSIS — G4733 Obstructive sleep apnea (adult) (pediatric): Secondary | ICD-10-CM

## 2021-08-29 DIAGNOSIS — E785 Hyperlipidemia, unspecified: Secondary | ICD-10-CM

## 2021-08-29 MED ORDER — APIXABAN 5 MG PO TABS: 5.0000 mg | ORAL_TABLET | Freq: Two times a day (BID) | ORAL | 3 refills | Status: DC

## 2021-08-29 NOTE — Patient Instructions (Signed)
Medication Instructions:  Your physician recommends that you continue on your current medications as directed. Please refer to the Current Medication list given to you today.  *If you need a refill on your cardiac medications before your next appointment, please call your pharmacy*   Lab Work: None If you have labs (blood work) drawn today and your tests are completely normal, you will receive your results only by: [place] (if you have MyChart) OR A paper copy in the mail If you have any lab test that is abnormal or we need to change your treatment, we will call you to review the results.   Follow-Up: At [facility], you and your health needs are our priority.  As part of our continuing mission to provide you with exceptional heart care, we have created designated Provider Care Teams.  These Care Teams include your primary Cardiologist (physician) and Advanced Practice Providers (APPs -  Physician Assistants and Nurse Practitioners) who all work together to provide you with the care you need, when you need it.  We recommend signing up for the patient portal called "MyChart".  Sign up information is provided on this After Visit Summary.  MyChart is used to connect with patients for Virtual Visits (Telemedicine).  Patients are able to view lab/test results, encounter notes, upcoming appointments, etc.  Non-urgent messages can be sent to your provider as well.   To learn more about what you can do with MyChart, go to [email or web address].    Your next appointment:   6 month(s)  The format for your next appointment:   In Person  Provider:   You may see [name], MD or one of the following Advanced Practice Providers on your designated Care Team:   Melina Copa, [name] Ermalinda Barrios, [name]

## 2021-08-29 NOTE — Telephone Encounter (Addendum)
Pt seen today for follow up, never started Eliquis as advised in August. New rx was sent today but doesn't look like he activated his copay card either. I have done this for pt and called it to his pharmacy. He has Pharmacist, community and qualifies for $10/month copay. ID [id number].

## 2021-09-06 ENCOUNTER — Ambulatory Visit (INDEPENDENT_AMBULATORY_CARE_PROVIDER_SITE_OTHER): Payer: Medicare Other | Admitting: Podiatry

## 2021-09-06 ENCOUNTER — Other Ambulatory Visit: Payer: Self-pay

## 2021-09-06 DIAGNOSIS — M79675 Pain in left toe(s): Secondary | ICD-10-CM

## 2021-09-06 DIAGNOSIS — I739 Peripheral vascular disease, unspecified: Secondary | ICD-10-CM

## 2021-09-06 DIAGNOSIS — B351 Tinea unguium: Secondary | ICD-10-CM | POA: Diagnosis not present

## 2021-09-06 DIAGNOSIS — E11649 Type 2 diabetes mellitus with hypoglycemia without coma: Secondary | ICD-10-CM | POA: Diagnosis not present

## 2021-09-06 DIAGNOSIS — M79674 Pain in right toe(s): Secondary | ICD-10-CM | POA: Diagnosis not present

## 2021-09-10 NOTE — Progress Notes (Signed)
  Subjective:  Patient ID: Steven Maldonado, male    DOB: 01-Sep-1941,  MRN: [id number]  Chief Complaint  Patient presents with   Nail Problem    Thick painful toenails, 3 month follow up    80 y.o. male presents with the above complaint. History confirmed with patient.  He is doing well no new issues since his last visit.   Objective:  Physical Exam: warm, good capillary refill, no trophic changes or ulcerative lesions, normal DP and PT pulses, and normal sensory exam.  Right Foot: dystrophic yellowed discolored nail plates with subungual debris and submetatarsal 5 callus  Assessment:   1. Pain due to onychomycosis of toenails of both feet   2. PAD (peripheral artery disease) ([place])   3. Uncontrolled type 2 diabetes mellitus with hypoglycemia without coma ([place])       Plan:  Patient was evaluated and treated and all questions answered.  Patient educated on diabetes. Discussed proper diabetic foot care and discussed risks and complications of disease. Educated patient in depth on reasons to return to the office immediately should he/she discover anything concerning or new on the feet. All questions answered. Discussed proper shoes as well.   Discussed the etiology and treatment options for the condition in detail with the patient. Educated patient on the topical and oral treatment options for mycotic nails. Recommended debridement of the nails today. Sharp and mechanical debridement performed of all painful and mycotic nails today. Nails debrided in length and thickness using a nail nipper to level of comfort. Discussed treatment options including appropriate shoe gear. Follow up as needed for painful nails.      Return in about 3 months (around 12/07/2021) for at risk diabetic foot care.

## 2021-09-14 ENCOUNTER — Other Ambulatory Visit: Payer: Self-pay | Admitting: Endocrinology

## 2021-09-24 ENCOUNTER — Ambulatory Visit (INDEPENDENT_AMBULATORY_CARE_PROVIDER_SITE_OTHER): Payer: Medicare Other

## 2021-09-24 DIAGNOSIS — I442 Atrioventricular block, complete: Secondary | ICD-10-CM | POA: Diagnosis not present

## 2021-09-25 LAB — CUP PACEART REMOTE DEVICE CHECK
Battery Remaining Longevity: 80 mo
Battery Voltage: 2.9 V
Brady Statistic AP VP Percent: 36.37 %
Brady Statistic AP VS Percent: 0.08 %
Brady Statistic AS VP Percent: 59.32 %
Brady Statistic AS VS Percent: 4.23 %
Brady Statistic RA Percent Paced: 38.52 %
Brady Statistic RV Percent Paced: 95.69 %
Date Time Interrogation Session: 20221115025119
Implantable Lead Implant Date: 20190124
Implantable Lead Implant Date: 20190124
Implantable Lead Location: 753859
Implantable Lead Location: 753860
Implantable Lead Model: 5076
Implantable Lead Model: 5076
Implantable Pulse Generator Implant Date: 20190124
Lead Channel Impedance Value: 323 Ohm
Lead Channel Impedance Value: 361 Ohm
Lead Channel Impedance Value: 361 Ohm
Lead Channel Impedance Value: 456 Ohm
Lead Channel Pacing Threshold Amplitude: 0.625 V
Lead Channel Pacing Threshold Amplitude: 0.625 V
Lead Channel Pacing Threshold Pulse Width: 0.4 ms
Lead Channel Pacing Threshold Pulse Width: 0.4 ms
Lead Channel Sensing Intrinsic Amplitude: 20.125 mV
Lead Channel Sensing Intrinsic Amplitude: 20.125 mV
Lead Channel Sensing Intrinsic Amplitude: 3.375 mV
Lead Channel Sensing Intrinsic Amplitude: 3.375 mV
Lead Channel Setting Pacing Amplitude: 2 V
Lead Channel Setting Pacing Amplitude: 2.5 V
Lead Channel Setting Pacing Pulse Width: 0.4 ms
Lead Channel Setting Sensing Sensitivity: 4 mV

## 2021-09-26 ENCOUNTER — Encounter (INDEPENDENT_AMBULATORY_CARE_PROVIDER_SITE_OTHER): Payer: Medicare Other | Admitting: Ophthalmology

## 2021-09-26 ENCOUNTER — Other Ambulatory Visit: Payer: Self-pay

## 2021-09-26 DIAGNOSIS — H35033 Hypertensive retinopathy, bilateral: Secondary | ICD-10-CM | POA: Diagnosis not present

## 2021-09-26 DIAGNOSIS — E113311 Type 2 diabetes mellitus with moderate nonproliferative diabetic retinopathy with macular edema, right eye: Secondary | ICD-10-CM

## 2021-09-26 DIAGNOSIS — H353112 Nonexudative age-related macular degeneration, right eye, intermediate dry stage: Secondary | ICD-10-CM

## 2021-09-26 DIAGNOSIS — I10 Essential (primary) hypertension: Secondary | ICD-10-CM

## 2021-09-26 DIAGNOSIS — E113392 Type 2 diabetes mellitus with moderate nonproliferative diabetic retinopathy without macular edema, left eye: Secondary | ICD-10-CM

## 2021-09-26 DIAGNOSIS — H43813 Vitreous degeneration, bilateral: Secondary | ICD-10-CM | POA: Diagnosis not present

## 2021-09-27 ENCOUNTER — Other Ambulatory Visit: Payer: Self-pay | Admitting: Endocrinology

## 2021-09-27 DIAGNOSIS — Z794 Long term (current) use of insulin: Secondary | ICD-10-CM

## 2021-09-28 ENCOUNTER — Other Ambulatory Visit (INDEPENDENT_AMBULATORY_CARE_PROVIDER_SITE_OTHER): Payer: Medicare Other

## 2021-09-28 ENCOUNTER — Other Ambulatory Visit: Payer: Self-pay

## 2021-09-28 DIAGNOSIS — E1165 Type 2 diabetes mellitus with hyperglycemia: Secondary | ICD-10-CM | POA: Diagnosis not present

## 2021-09-28 DIAGNOSIS — Z794 Long term (current) use of insulin: Secondary | ICD-10-CM | POA: Diagnosis not present

## 2021-09-28 LAB — BASIC METABOLIC PANEL
BUN: 28 mg/dL — ABNORMAL HIGH (ref 6–23)
CO2: 27 mEq/L (ref 19–32)
Calcium: 9.5 mg/dL (ref 8.4–10.5)
Chloride: 102 mEq/L (ref 96–112)
Creatinine, Ser: 1.69 mg/dL — ABNORMAL HIGH (ref 0.40–1.50)
GFR: 38.55 mL/min — ABNORMAL LOW (ref >60.00)
Glucose, Bld: 214 mg/dL — ABNORMAL HIGH (ref 70–99)
Potassium: 4.2 mEq/L (ref 3.5–5.1)
Sodium: 138 mEq/L (ref 135–145)

## 2021-09-28 LAB — HEMOGLOBIN A1C: Hgb A1c MFr Bld: 8.4 % — ABNORMAL HIGH (ref 4.6–6.5)

## 2021-10-02 ENCOUNTER — Encounter: Payer: Self-pay | Admitting: Physician Assistant

## 2021-10-02 ENCOUNTER — Other Ambulatory Visit: Payer: Self-pay

## 2021-10-02 ENCOUNTER — Ambulatory Visit (INDEPENDENT_AMBULATORY_CARE_PROVIDER_SITE_OTHER): Payer: Medicare Other | Admitting: Physician Assistant

## 2021-10-02 VITALS — BP 118/64 | HR 97 | Temp 98.7°F | Ht 71.0 in | Wt 230.1 lb

## 2021-10-02 DIAGNOSIS — E1169 Type 2 diabetes mellitus with other specified complication: Secondary | ICD-10-CM

## 2021-10-02 DIAGNOSIS — I152 Hypertension secondary to endocrine disorders: Secondary | ICD-10-CM

## 2021-10-02 DIAGNOSIS — E782 Mixed hyperlipidemia: Secondary | ICD-10-CM | POA: Diagnosis not present

## 2021-10-02 DIAGNOSIS — E11649 Type 2 diabetes mellitus with hypoglycemia without coma: Secondary | ICD-10-CM | POA: Diagnosis not present

## 2021-10-02 DIAGNOSIS — E1159 Type 2 diabetes mellitus with other circulatory complications: Secondary | ICD-10-CM | POA: Diagnosis not present

## 2021-10-02 DIAGNOSIS — N183 Chronic kidney disease, stage 3 unspecified: Secondary | ICD-10-CM

## 2021-10-02 NOTE — Progress Notes (Signed)
Remote pacemaker transmission.   

## 2021-10-02 NOTE — Progress Notes (Signed)
Established Patient Office Visit  Subjective:  Patient ID: Steven Maldonado, male    DOB: 12-30-1940  Age: 80 y.o. MRN: [id number]  CC:  Chief Complaint  Patient presents with   Diabetes    HPI Steven Maldonado presents for follow up on diabetes mellitus, hypertension and hyperlipidemia.  Diabetes: Pt denies increased urination or thirst. Pt reports medication compliance. No hypoglycemic events like before. Checking glucose at home. Reports has noticed some sugar readings in the 200s and 300s. States has not been as compliant with following a diabetic diet. Reports has seen a diabetic nutritionist in the past and is aware of what he should be eating.  HTN: Pt denies chest pain, palpitations, dizziness or lower extremity swelling. Taking medication as directed without side effects. Drinks about 64 fl oz of water daily.   HLD: Pt taking medications (Zetia, fenofibrate and Atorvastatin) as directed without issues. Denies side effects including myalgias, muscle weakness and RUQ pain.     Past Medical History:  Diagnosis Date   Chronic diastolic CHF (congestive heart failure) (HCC)    CKD (chronic kidney disease), stage III (HCC)    Constipation    Coronary artery disease    a. Cath February 2012 in [facility], occluded RCA with collaterals   DM type 2 (diabetes mellitus, type 2) ([place])    Essential hypertension    Hyperlipidemia    Neuropathy    feet   Pacemaker    a. symptomatic brady after TAVR s/p MDT PPM by Dr. Curt Bears 12/04/17   Persistent atrial fibrillation (HCC)    PONV (postoperative nausea and vomiting)    after valve surgery   PVD (peripheral vascular disease) ([place])    a. s/p R popliteal artery stenosis tx with drug-coated balloon 05/2014, followed by Dr. Fletcher Anon.   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 12/02/17: s/p  TAVR   Skin cancer    Sleep apnea with use of continuous positive airway pressure (CPAP)    04-11-11 AHI was 32.9 and titrated to 15 cm H20, DME is [facility]   Subclinical hypothyroidism     Past Surgical History:  Procedure Laterality Date   ABDOMINAL ANGIOGRAM N/A 06/08/2014   Procedure: ABDOMINAL ANGIOGRAM;  Surgeon: Wellington Hampshire, MD;  Location: [place] [facility];  Service: Cardiovascular;  Laterality: N/A;   ABDOMINAL AORTOGRAM N/A 04/09/2018   Procedure: ABDOMINAL AORTOGRAM;  Surgeon: Conrad Hanna, MD;  Location: [place] [facility];  Service: Cardiovascular;  Laterality: N/A;   ABDOMINAL AORTOGRAM W/LOWER EXTREMITY N/A 08/23/2020   Procedure: ABDOMINAL AORTOGRAM W/LOWER EXTREMITY;  Surgeon: Wellington Hampshire, MD;  Location: [place] [facility];  Service: Cardiovascular;  Laterality: N/A;   AMPUTATION Left 04/17/2018   Procedure: LEFT FOOT 3RD RAY AMPUTATION;  Surgeon: Newt Minion, MD;  Location: [place];  Service: Orthopedics;  Laterality: Left;   AMPUTATION Left 05/28/2018   Procedure: LEFT AMPUTATION BELOW KNEE;  Surgeon: Wylene Simmer, MD;  Location: [place];  Service: Orthopedics;  Laterality: Left;   [place] N/A 11/08/2020   Procedure: ATRIAL FIBRILLATION ABLATION;  Surgeon: Constance Haw, MD;  Location: [place] [facility];  Service: Cardiovascular;  Laterality: N/A;   BELOW KNEE LEG AMPUTATION Left 05/28/2018   CARDIAC CATHETERIZATION  12/2010   CARDIOVERSION  07/2011   CARDIOVERSION  N/A 04/18/2014   Procedure: CARDIOVERSION;  Surgeon: Dorothy Spark, MD;  Location: [place];  Service: Cardiovascular;  Laterality: N/A;   CARDIOVERSION N/A 11/03/2015   Procedure: CARDIOVERSION;  Surgeon: Lelon Perla, MD;  Location: [place];  Service: Cardiovascular;  Laterality: N/A;   CARDIOVERSION N/A 05/08/2017   Procedure: CARDIOVERSION;  Surgeon: Dorothy Spark, MD;  Location: [place];  Service: Cardiovascular;  Laterality: N/A;    CARDIOVERSION N/A 07/28/2017   Procedure: CARDIOVERSION;  Surgeon: Dorothy Spark, MD;  Location: [facility];  Service: Cardiovascular;  Laterality: N/A;   CARDIOVERSION N/A 02/08/2020   Procedure: CARDIOVERSION;  Surgeon: Buford Dresser, MD;  [facility];  Service: Cardiovascular;  Laterality: N/A;   CARDIOVERSION N/A 03/15/2020   Procedure: CARDIOVERSION;  Surgeon: Acie Fredrickson Wonda Cheng, MD;  Location: [facility] ENDOSCOPY;  Service: Cardiovascular;  Laterality: N/A;   CARDIOVERSION N/A 09/20/2020   Procedure: CARDIOVERSION;  Surgeon: Werner Lean, MD;  Location: [place];  Service: Cardiovascular;  Laterality: N/A;   LOWER EXTREMITY ANGIOGRAM N/A 06/08/2014   Procedure: LOWER EXTREMITY ANGIOGRAM;  Surgeon: Wellington Hampshire, MD;  Location: [facility] C[facility] LAB;  Service: Cardiovascular;  Laterality: N/A;   LOWER EXTREMITY ANGIOGRAPHY Left 04/09/2018   Procedure: Lower Extremity Angiography;  Surgeon: Conrad Judson, MD;  Location: [place] CV LAB;  Service: Cardiovascular;  Laterality: Left;   PACEMAKER IMPLANT N/A 12/04/2017   Procedure: PACEMAKER IMPLANT;  Surgeon: Constance Haw, MD;  Location: [place] [facility];  Service: Cardiovascular;  Laterality: N/A;   PERIPHERAL VASCULAR ATHERECTOMY  08/23/2020   Procedure: PERIPHERAL VASCULAR ATHERECTOMY;  Surgeon: Wellington Hampshire, MD;  Location: [place] [facility];  Service: Cardiovascular;;   PERIPHERAL VASCULAR BALLOON ANGIOPLASTY Left 04/09/2018   Procedure: PERIPHERAL VASCULAR BALLOON ANGIOPLASTY;  Surgeon: Conrad Converse, MD;  Location: [place] [facility];  Service: Cardiovascular;  Laterality: Left;  SFA   POPLITEAL ARTERY ANGIOPLASTY Right 06/08/2014   Archie Endo 06/08/2014   RIGHT/LEFT HEART CATH AND CORONARY ANGIOGRAPHY N/A 10/08/2017   Procedure: RIGHT/LEFT HEART CATH AND CORONARY ANGIOGRAPHY;  Surgeon: Burnell Blanks, MD;  Location: [place] [facility];  Service: Cardiovascular;  Laterality: N/A;   SKIN CANCER  EXCISION Bilateral    "have had them cut off back of neck X 2; off left upper arm; right wrist, near right shoulder blade" (06/08/2014)   TEE WITHOUT CARDIOVERSION N/A 12/02/2017   Procedure: TRANSESOPHAGEAL ECHOCARDIOGRAM (TEE);  Surgeon: Burnell Blanks, MD;  Location: [place];  Service: Open Heart Surgery;  Laterality: N/A;   TEMPORARY PACEMAKER N/A 12/04/2017   Procedure: TEMPORARY PACEMAKER;  Surgeon: Leonie Man, MD;  Location: [place] [facility];  Service: Cardiovascular;  Laterality: N/A;   TRANSCATHETER AORTIC VALVE REPLACEMENT, TRANSFEMORAL N/A 12/02/2017   Procedure: TRANSCATHETER AORTIC VALVE REPLACEMENT, TRANSFEMORAL;  Surgeon: Burnell Blanks, MD;  Location: [place];  Service: Open Heart Surgery;  Laterality: N/A;  using Edwards Sapien 3 Transcatheter Heart Valve size 51m    Family History  Problem Relation Age of Onset   Diabetes Mother    Heart attack Mother    Hypertension Mother    Heart attack Father    Heart failure Father    Hypertension Father    Diabetes Father    Diabetes Sister    Diabetes Brother    Diabetes Other    Diabetes Daughter        TYPE ll   Heart Problems Daughter    Hypertension Sister    Hypertension  Brother    Stroke Brother     Social History   Socioeconomic History   Marital status: Married    Spouse name: [name]   Number of children: 1   Years of education: 12   Highest education level: Not on file  Occupational History   Occupation: retired    Fish farm manager: [employer]  Tobacco Use   Smoking status: Former    Packs/day: 2.00    Years: 14.00    Pack years: 28.00    Types: Cigarettes    Quit date: 11/11/1972    Years since quitting: 48.9   Smokeless tobacco: Never  Vaping Use   Vaping Use: Never used  Substance and Sexual Activity   Alcohol use: No    Comment: quit in 1984   Drug use: No   Sexual activity: Not Currently  Other Topics Concern   Not on file  Social History Narrative   Patient is married  Engineer, drilling) and lives at home with his wife.   Patient has one child and his wife has one child.   Patient is retired.   Patient has a high school education.   Patient is right-handed.   Patient drinks very little caffeine.   Social Determinants of Health   Financial Resource Strain: Not on file  Food Insecurity: No Food Insecurity   Worried About Charity fundraiser in the Last Year: Never true   Ran Out of Food in the Last Year: Never true  Transportation Needs: No Transportation Needs   Lack of Transportation (Medical): No   Lack of Transportation (Non-Medical): No  Physical Activity: Not on file  Stress: Not on file  Social Connections: Not on file  Intimate Partner Violence: Not on file    Outpatient Medications Prior to Visit  Medication Sig Dispense Refill   apixaban (ELIQUIS) 5 MG TABS tablet Take 1 tablet (5 mg total) by mouth 2 (two) times daily. 180 tablet 3   atorvastatin (LIPITOR) 40 MG tablet Take 1 tablet (40 mg total) by mouth daily. 90 tablet 3   BD PEN NEEDLE NANO 2ND GEN 32G X 4 MM MISC USE 4 (FOUR) TIMES DAILY. 200 each 2   Blood Glucose Monitoring Suppl (FREESTYLE LITE) DEVI 1 each by Does not apply route 2 (two) times daily. 1 each 0   Choline Fenofibrate (FENOFIBRIC ACID) 135 MG CPDR TAKE 1 CAPSULE BY MOUTH EVERY DAY 90 capsule 0   docusate sodium (COLACE) 100 MG capsule Take 300 mg by mouth daily.      ezetimibe (ZETIA) 10 MG tablet Take 1 tablet (10 mg total) by mouth daily. 90 tablet 3   FREESTYLE LITE test strip 1 EACH BY OTHER ROUTE 4 (FOUR) TIMES DAILY - BEFORE MEALS AND AT BEDTIME. 450 strip 3   furosemide (LASIX) 20 MG tablet Take 1 tablet (20 mg total) by mouth every other day.     glipiZIDE (GLUCOTROL XL) 5 MG 24 hr tablet Take 1 tablet (5 mg total) by mouth daily with breakfast. 90 tablet 1   insulin lispro (HUMALOG KWIKPEN) 100 UNIT/ML KwikPen Inject 5-8 Units into the skin See admin instructions. Inject 5-8 units under the skin up to three times  daily if eating sweets or high carb foods. 30 mL 3   levothyroxine (SYNTHROID) 200 MCG tablet TAKE 1 TABLET BY MOUTH DAILY MONDAY-SATURDAY  TAKE 1/2 TABLET ON SUNDAY 30 tablet 5   metoprolol tartrate (LOPRESSOR) 50 MG tablet TAKE ONE AND ONE-HALF TABLET BY MOUTH TWICE A  DAY 270 tablet 3   mirabegron ER (MYRBETRIQ) 25 MG TB24 tablet Take 1 tablet (25 mg total) by mouth daily. 90 tablet 2   mupirocin ointment (BACTROBAN) 2 % Apply to great toe wound daily, with gauze and tape 30 g 0   Semaglutide,0.25 or 0.5MG/DOS, (OZEMPIC, 0.25 OR 0.5 MG/DOSE,) 2 MG/1.5ML SOPN Inject 0.5 mg into the skin once a week. 4.5 mL 1   TRESIBA FLEXTOUCH 200 UNIT/ML FlexTouch Pen INJECT SUBCUTANEOUSLY 26   UNITS AT BEDTIME 18 mL 1   No facility-administered medications prior to visit.    No Known Allergies  ROS Review of Systems Review of Systems:  A fourteen system review of systems was performed and found to be positive as per HPI.   Objective:    Physical Exam General:  Well Developed, well nourished, appropriate for stated age.  Neuro:  Alert and oriented,  extra-ocular muscles intact  HEENT:  Normocephalic, atraumatic, neck supple Skin:  no gross rash, warm, pink. Cardiac:  RRR, S1 S2 Respiratory:  CTA B/L, Not using accessory muscles, speaking in full sentences- unlabored. Vascular:  Ext warm, no cyanosis apprec.; cap RF less 2 sec. Psych:  No HI/SI, judgement and insight good, Euthymic mood. Full Affect.  BP 118/64   Pulse 97   Temp 98.7 F (37.1 C)   Ht '5\' 11"'  (1.803 m)   Wt 230 lb 1.9 oz (104.4 kg)   SpO2 98%   BMI 32.10 kg/m  Wt Readings from Last 3 Encounters:  10/02/21 230 lb 1.9 oz (104.4 kg)  08/29/21 241 lb 12.8 oz (109.7 kg)  08/16/21 235 lb (106.6 kg)     Health Maintenance Due  Topic Date Due   Hepatitis C Screening  Never done   COVID-19 Vaccine (4 - Booster for Pfizer series) 09/06/2020   INFLUENZA VACCINE  06/11/2021    There are no preventive care reminders to display  for this patient.  Lab Results  Component Value Date   TSH 3.540 08/02/2021   Lab Results  Component Value Date   WBC 6.0 08/02/2021   HGB 13.3 08/02/2021   HCT 38.8 08/02/2021   MCV 93 08/02/2021   PLT 137 (L) 08/02/2021   Lab Results  Component Value Date   NA 138 09/28/2021   K 4.2 09/28/2021   CO2 27 09/28/2021   GLUCOSE 214 (H) 09/28/2021   BUN 28 (H) 09/28/2021   CREATININE 1.69 (H) 09/28/2021   BILITOT 0.4 08/02/2021   ALKPHOS 52 08/02/2021   AST 19 08/02/2021   ALT 21 08/02/2021   PROT 6.7 08/02/2021   ALBUMIN 4.3 08/02/2021   CALCIUM 9.5 09/28/2021   ANIONGAP 10 11/08/2020   EGFR 42 (L) 08/02/2021   GFR 38.55 (L) 09/28/2021   Lab Results  Component Value Date   CHOL 178 08/02/2021   Lab Results  Component Value Date   HDL 31 (L) 08/02/2021   Lab Results  Component Value Date   LDLCALC 118 (H) 08/02/2021   Lab Results  Component Value Date   TRIG 164 (H) 08/02/2021   Lab Results  Component Value Date   CHOLHDL 5.7 (H) 08/02/2021   Lab Results  Component Value Date   HGBA1C 8.4 (H) 09/28/2021      Assessment & Plan:   Problem List Items Addressed This Visit       Cardiovascular and Mediastinum   Hypertension associated with diabetes ([place])     Endocrine   Mixed diabetic hyperlipidemia associated with type 2 diabetes  mellitus ([place]) - Primary   Type II diabetes mellitus, uncontrolled     Genitourinary   Chronic kidney disease   Uncontrolled Type II diabetes mellitus: -Discussed with patient uncontrolled A1c which increased 7.2 to 8.4 and stressed the importance of improving diet by reducing simple carbohydrates and sugar intake. Continue current medication regimen. -Follow up with endocrinology as scheduled. -Will continue to monitor.  Hypertension associated with diabetes: -Soft blood pressure which improved after recheck and stable. -Continue current medication regimen. -Will continue to monitor.  Mixed diabetic hyperlipidemia  associated with type 2 diabetes mellitus: -Last lipid panel, LDL 118. -Patient reports compliance with medications. Discussed low fat diet. -Recommend repeating lipid panel at follow up visit and if LDL remains above goal then recommend medication adjustments.  CKD: -Followed by Nephrology ([facility]) -Recent BMP: Cr 1.69, GFR 38.55, stable. -Will continue to monitor.   No orders of the defined types were placed in this encounter.   Follow-up: Return in about 4 months (around 01/30/2022) for DM, HLD, HTN.    Lorrene Reid, [name]

## 2021-10-02 NOTE — Patient Instructions (Signed)

## 2021-10-03 ENCOUNTER — Other Ambulatory Visit: Payer: Self-pay | Admitting: Physician Assistant

## 2021-10-03 ENCOUNTER — Encounter: Payer: Self-pay | Admitting: Endocrinology

## 2021-10-03 ENCOUNTER — Ambulatory Visit (INDEPENDENT_AMBULATORY_CARE_PROVIDER_SITE_OTHER): Payer: Medicare Other | Admitting: Endocrinology

## 2021-10-03 VITALS — BP 100/60 | HR 91 | Ht 71.0 in | Wt 233.0 lb

## 2021-10-03 DIAGNOSIS — E039 Hypothyroidism, unspecified: Secondary | ICD-10-CM | POA: Diagnosis not present

## 2021-10-03 DIAGNOSIS — E782 Mixed hyperlipidemia: Secondary | ICD-10-CM | POA: Diagnosis not present

## 2021-10-03 DIAGNOSIS — E1165 Type 2 diabetes mellitus with hyperglycemia: Secondary | ICD-10-CM | POA: Diagnosis not present

## 2021-10-03 DIAGNOSIS — Z794 Long term (current) use of insulin: Secondary | ICD-10-CM

## 2021-10-03 DIAGNOSIS — N3281 Overactive bladder: Secondary | ICD-10-CM

## 2021-10-03 DIAGNOSIS — I2510 Atherosclerotic heart disease of native coronary artery without angina pectoris: Secondary | ICD-10-CM

## 2021-10-03 NOTE — Progress Notes (Signed)
Patient ID: Steven Maldonado, male   DOB: 08/27/1941, 80 y.o.   MRN: [id number]          Reason for Appointment: Endocrinology follow-up     History of Present Illness:     DIABETES:       Date of diagnosis of type 2 diabetes mellitus:  1999      Background history:   He had been treated with metformin and glipizide for several years Metformin was stopped about 4 years ago because of renal dysfunction Presumably because of poor control he was started on insulin around the year 2009 He had been mostly on Lantus which was subsequently changed to [employer] and he thinks that Lantus worked better Also has been on Humalog 3-4 times a day for some time  Recent history:    INSULIN regimen is:  TRESIBA 16 units at bedtime, Humalog 5 units ac bid  Non-insulin hypoglycemic drugs the patient is taking are: on Ozempic 0.5 mg weekly, glipizide ER 5 mg daily  Most recent A1c is 8.4 compared to 7.2  Current management, blood sugar patterns and problems identified:  Not clear why his A1c is higher as recently blood sugars appear to be averaging about 135 on his LIBRE  he has had a tendency to low blood sugars periodically especially on his last visit last month and he is here for short-term follow-up  He was told to reduce his glipizide to 5 mg instead of 10 mg  Also he was told to take only Humalog when he is eating carbohydrates at meals and also not take any correction doses late at night which causes low sugars  Currently blood sugars are being checked by his freestyle libre but he is primarily checking them in the morning only  With this his OVERNIGHT blood sugars are between 125-150 average and relatively lower early morning  He thinks his blood sugars go up.  Eating cereal and he is not eating this regularly However did not increase his Humalog 1 eating cereal and will have occasional sugars over 200 with this Does not appear to be having high readings after dinner except once NO  HYPOGLYCEMIA with occasionally low normal readings early morning as before.   He appears to be aware that he has to take Humalog at the start of the meal but this was confirmed today and we will try to do this more consistently Mostly is eating 2 meals a day at about 9 AM and 4 PM Weight appears to be about the same overall since last visit    Side effects from medications have been: None     Meal times are:  Breakfast is at 9 AM, dinnertime 4-5 PM  Freestyle libre data: As above, average 135 with only 35% active time in the last 2 weeks  PREVIOUSLY: Only overnight blood sugar data available with average 172 at midnight and 140-149 between 2 AM and 9 AM  CGM use % of time   2-week average/GV 151  Time in range       68%  % Time Above 180 17+5  % Time above 250   % Time Below 70 9      Dietician visit, most recent: 9/19  Weight history:  Wt Readings from Last 3 Encounters:  10/03/21 233 lb (105.7 kg)  10/02/21 230 lb 1.9 oz (104.4 kg)  08/29/21 241 lb 12.8 oz (109.7 kg)    Glycemic control:   Lab Results  Component Value Date  HGBA1C 8.4 (H) 09/28/2021   HGBA1C 7.2 (A) 07/02/2021   HGBA1C 7.1 (A) 05/15/2021   Lab Results  Component Value Date   MICROALBUR 10 05/25/2020   LDLCALC 118 (H) 08/02/2021   CREATININE 1.69 (H) 09/28/2021   Lab Results  Component Value Date   MICRALBCREAT 38 (H) 08/02/2021    Lab Results  Component Value Date   FRUCTOSAMINE 268 06/26/2018   HYPOTHYROIDISM and other problems: See review of systems   Lab on 09/28/2021  Component Date Value Ref Range Status   Sodium 09/28/2021 138  135 - 145 mEq/L Final   Potassium 09/28/2021 4.2  3.5 - 5.1 mEq/L Final   Chloride 09/28/2021 102  96 - 112 mEq/L Final   CO2 09/28/2021 27  19 - 32 mEq/L Final   Glucose, Bld 09/28/2021 214 (H)  70 - 99 mg/dL Final   BUN 09/28/2021 28 (H)  6 - 23 mg/dL Final   Creatinine, Ser 09/28/2021 1.69 (H)  0.40 - 1.50 mg/dL Final   GFR 09/28/2021 38.55 (L)   >60.00 mL/min Final   Calculated using the CKD-EPI Creatinine Equation (2021)   Calcium 09/28/2021 9.5  8.4 - 10.5 mg/dL Final   Hgb A1c MFr Bld 09/28/2021 8.4 (H)  4.6 - 6.5 % Final   Glycemic Control Guidelines for People with Diabetes:Non Diabetic:  <6%Goal of Therapy: <7%Additional Action Suggested:  >8%     Allergies as of 10/03/2021   No Known Allergies      Medication List        Accurate as of October 03, 2021 11:34 AM. If you have any questions, ask your nurse or doctor.          apixaban 5 MG Tabs tablet Commonly known as: ELIQUIS Take 1 tablet (5 mg total) by mouth 2 (two) times daily.   atorvastatin 40 MG tablet Commonly known as: LIPITOR Take 1 tablet (40 mg total) by mouth daily.   BD Pen Needle Nano 2nd Gen 32G X 4 MM Misc Generic drug: Insulin Pen Needle USE 4 (FOUR) TIMES DAILY.   docusate sodium 100 MG capsule Commonly known as: COLACE Take 300 mg by mouth daily.   ezetimibe 10 MG tablet Commonly known as: Zetia Take 1 tablet (10 mg total) by mouth daily.   Fenofibric Acid 135 MG Cpdr TAKE 1 CAPSULE BY MOUTH EVERY DAY   FreeStyle Lite Devi 1 each by Does not apply route 2 (two) times daily.   FREESTYLE LITE test strip Generic drug: glucose blood 1 EACH BY OTHER ROUTE 4 (FOUR) TIMES DAILY - BEFORE MEALS AND AT BEDTIME.   furosemide 20 MG tablet Commonly known as: LASIX Take 1 tablet (20 mg total) by mouth every other day.   glipiZIDE 5 MG 24 hr tablet Commonly known as: GLUCOTROL XL Take 1 tablet (5 mg total) by mouth daily with breakfast.   insulin lispro 100 UNIT/ML KwikPen Commonly known as: HumaLOG KwikPen Inject 5-8 Units into the skin See admin instructions. Inject 5-8 units under the skin up to three times daily if eating sweets or high carb foods.   levothyroxine 200 MCG tablet Commonly known as: SYNTHROID TAKE 1 TABLET BY MOUTH DAILY MONDAY-SATURDAY  TAKE 1/2 TABLET ON SUNDAY   metoprolol tartrate 50 MG tablet Commonly  known as: LOPRESSOR TAKE ONE AND ONE-HALF TABLET BY MOUTH TWICE A DAY   mirabegron ER 25 MG Tb24 tablet Commonly known as: MYRBETRIQ Take 1 tablet (25 mg total) by mouth daily.   mupirocin ointment  2 % Commonly known as: BACTROBAN Apply to great toe wound daily, with gauze and tape   Ozempic (0.25 or 0.5 MG/DOSE) 2 MG/1.5ML Sopn Generic drug: Semaglutide(0.25 or 0.5MG /DOS) Inject 0.5 mg into the skin once a week.   [name] FlexTouch 200 UNIT/ML FlexTouch Pen Generic drug: insulin degludec INJECT SUBCUTANEOUSLY 26   UNITS AT BEDTIME        Allergies: No Known Allergies  Past Medical History:  Diagnosis Date   Chronic diastolic CHF (congestive heart failure) (HCC)    CKD (chronic kidney disease), stage III (HCC)    Constipation    Coronary artery disease    a. Cath February 2012 in [facility], occluded RCA with collaterals   DM type 2 (diabetes mellitus, type 2) ([place])    Essential hypertension    Hyperlipidemia    Neuropathy    feet   Pacemaker    a. symptomatic brady after TAVR s/p MDT PPM by Dr. Curt Bears 12/04/17   Persistent atrial fibrillation (HCC)    PONV (postoperative nausea and vomiting)    after valve surgery   PVD (peripheral vascular disease) ([place])    a. s/p R popliteal artery stenosis tx with drug-coated balloon 05/2014, followed by Dr. Fletcher Anon.   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 12/02/17: s/p TAVR   Skin cancer    Sleep apnea with use of continuous positive airway pressure (CPAP)    04-11-11 AHI was 32.9 and titrated to 15 cm H20, DME is [facility]   Subclinical hypothyroidism     Past Surgical History:  Procedure Laterality Date   ABDOMINAL ANGIOGRAM N/A 06/08/2014   Procedure: ABDOMINAL ANGIOGRAM;  Surgeon: Wellington Hampshire, MD;  Location: [place] CATH LAB;  Service: Cardiovascular;  Laterality: N/A;    ABDOMINAL AORTOGRAM N/A 04/09/2018   Procedure: ABDOMINAL AORTOGRAM;  Surgeon: Conrad Kimmswick, MD;  Location: [place] [facility];  Service: Cardiovascular;  Laterality: N/A;   ABDOMINAL AORTOGRAM W/LOWER EXTREMITY N/A 08/23/2020   Procedure: ABDOMINAL AORTOGRAM W/LOWER EXTREMITY;  Surgeon: Wellington Hampshire, MD;  Location: [place] [facility];  Service: Cardiovascular;  Laterality: N/A;   AMPUTATION Left 04/17/2018   Procedure: LEFT FOOT 3RD RAY AMPUTATION;  Surgeon: Newt Minion, MD;  Location: [place];  Service: Orthopedics;  Laterality: Left;   AMPUTATION Left 05/28/2018   Procedure: LEFT AMPUTATION BELOW KNEE;  Surgeon: Wylene Simmer, MD;  Location: [place];  Service: Orthopedics;  Laterality: Left;   [place] N/A 11/08/2020   Procedure: ATRIAL FIBRILLATION ABLATION;  Surgeon: Constance Haw, MD;  Location: [place] CV LAB;  Service: Cardiovascular;  Laterality: N/A;   BELOW KNEE LEG AMPUTATION Left 05/28/2018   CARDIAC CATHETERIZATION  12/2010   CARDIOVERSION  07/2011   CARDIOVERSION N/A 04/18/2014   Procedure: CARDIOVERSION;  Surgeon: Dorothy Spark, MD;  Location: [place];  Service: Cardiovascular;  Laterality: N/A;   CARDIOVERSION N/A 11/03/2015   Procedure: CARDIOVERSION;  Surgeon: Lelon Perla, MD;  Location: [facility] ENDOSCOPY;  Service: Cardiovascular;  Laterality: N/A;   CARDIOVERSION N/A 05/08/2017   Procedure: CARDIOVERSION;  Surgeon: Dorothy Spark, MD;  Location: [facility] ENDOSCOPY;  Service: Cardiovascular;  Laterality: N/A;   CARDIOVERSION N/A 07/28/2017   Procedure: CARDIOVERSION;  Surgeon: Dorothy Spark, MD;  Location: [place];  Service: Cardiovascular;  Laterality: N/A;  CARDIOVERSION N/A 02/08/2020   Procedure: CARDIOVERSION;  Surgeon: Buford Dresser, MD;  Location: [facility] ENDOSCOPY;  Service: Cardiovascular;  Laterality: N/A;   CARDIOVERSION N/A 03/15/2020   Procedure: CARDIOVERSION;  Surgeon: Thayer Headings, MD;   Location: [facility] ENDOSCOPY;  Service: Cardiovascular;  Laterality: N/A;   CARDIOVERSION N/A 09/20/2020   Procedure: CARDIOVERSION;  Surgeon: Werner Lean, MD;  Location: [place];  Service: Cardiovascular;  Laterality: N/A;   LOWER EXTREMITY ANGIOGRAM N/A 06/08/2014   Procedure: LOWER EXTREMITY ANGIOGRAM;  Surgeon: Wellington Hampshire, MD;  Location: [place] CATH LAB;  Service: Cardiovascular;  Laterality: N/A;   LOWER EXTREMITY ANGIOGRAPHY Left 04/09/2018   Procedure: Lower Extremity Angiography;  Surgeon: Conrad Maskell, MD;  Location: [place] CV LAB;  Service: Cardiovascular;  Laterality: Left;   PACEMAKER IMPLANT N/A 12/04/2017   Procedure: PACEMAKER IMPLANT;  Surgeon: Constance Haw, MD;  Location: [place] CV LAB;  Service: Cardiovascular;  Laterality: N/A;   PERIPHERAL VASCULAR ATHERECTOMY  08/23/2020   Procedure: PERIPHERAL VASCULAR ATHERECTOMY;  Surgeon: Wellington Hampshire, MD;  Location: [place] CV LAB;  Service: Cardiovascular;;   PERIPHERAL VASCULAR BALLOON ANGIOPLASTY Left 04/09/2018   Procedure: PERIPHERAL VASCULAR BALLOON ANGIOPLASTY;  Surgeon: Conrad [place], MD;  Location: [place] CV LAB;  Service: Cardiovascular;  Laterality: Left;  SFA   POPLITEAL ARTERY ANGIOPLASTY Right 06/08/2014   Archie Endo 06/08/2014   RIGHT/LEFT HEART CATH AND CORONARY ANGIOGRAPHY N/A 10/08/2017   Procedure: RIGHT/LEFT HEART CATH AND CORONARY ANGIOGRAPHY;  Surgeon: Burnell Blanks, MD;  Location: [place] CV LAB;  Service: Cardiovascular;  Laterality: N/A;   SKIN CANCER EXCISION Bilateral    "have had them cut off back of neck X 2; off left upper arm; right wrist, near right shoulder blade" (06/08/2014)   TEE WITHOUT CARDIOVERSION N/A 12/02/2017   Procedure: TRANSESOPHAGEAL ECHOCARDIOGRAM (TEE);  Surgeon: Burnell Blanks, MD;  Location: [place];  Service: Open Heart Surgery;  Laterality: N/A;   TEMPORARY PACEMAKER N/A 12/04/2017   Procedure: TEMPORARY PACEMAKER;  Surgeon:  Leonie Man, MD;  Location: [place] CV LAB;  Service: Cardiovascular;  Laterality: N/A;   TRANSCATHETER AORTIC VALVE REPLACEMENT, TRANSFEMORAL N/A 12/02/2017   Procedure: TRANSCATHETER AORTIC VALVE REPLACEMENT, TRANSFEMORAL;  Surgeon: Burnell Blanks, MD;  Location: [place];  Service: Open Heart Surgery;  Laterality: N/A;  using Edwards Sapien 3 Transcatheter Heart Valve size 7mm    Family History  Problem Relation Age of Onset   Diabetes Mother    Heart attack Mother    Hypertension Mother    Heart attack Father    Heart failure Father    Hypertension Father    Diabetes Father    Diabetes Sister    Diabetes Brother    Diabetes Other    Diabetes Daughter        TYPE ll   Heart Problems Daughter    Hypertension Sister    Hypertension Brother    Stroke Brother     Social History:  reports that he quit smoking about 48 years ago. His smoking use included cigarettes. He has a 28.00 pack-year smoking history. He has never used smokeless tobacco. He reports that he does not drink alcohol and does not use drugs.   Review of Systems   Lipid history: He is supposed to be on 40  mg atorvastatin and ezetimibe  He is likely taking Zetia but since his LDL is higher may not be taking his atorvastatin Triglycerides are slightly higher but may have  been nonfasting  He has history of CAD    Lab Results  Component Value Date   CHOL 178 08/02/2021   CHOL 158 03/01/2021   CHOL 209 (H) 11/30/2020   Lab Results  Component Value Date   HDL 31 (L) 08/02/2021   HDL 35.80 (L) 03/01/2021   HDL 38.20 (L) 11/30/2020   Lab Results  Component Value Date   LDLCALC 118 (H) 08/02/2021   LDLCALC 94 03/01/2021   LDLCALC 145 (H) 11/30/2020   Lab Results  Component Value Date   TRIG 164 (H) 08/02/2021   TRIG 142.0 03/01/2021   TRIG 133.0 11/30/2020   Lab Results  Component Value Date   CHOLHDL 5.7 (H) 08/02/2021   CHOLHDL 4 03/01/2021   CHOLHDL 5 11/30/2020   Lab Results   Component Value Date   LDLDIRECT 123.0 12/03/2019   LDLDIRECT 77.0 08/18/2018            Hypertension: Has been controlled with only antihypertensive being metoprolol, not on ACE inhibitor  BP Readings from Last 3 Encounters:  10/03/21 100/60  10/02/21 118/64  08/29/21 120/60    Most recent eye exam was in 8/22  Most recent foot exam: 8/22   HYPOTHYROIDISM:   TSH is consistently normal with taking 6-1/2 tablets a week of the 200 mcg levothyroxine  Lab Results  Component Value Date   TSH 3.540 08/02/2021   TSH 4.41 03/01/2021   TSH 2.49 11/30/2020   FREET4 1.22 08/02/2021   FREET4 0.74 03/01/2021   FREET4 1.13 11/30/2020    Peripheral vascular disease, 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 History of symptomatic neuropathy  He has chronic kidney disease of unclear etiology, is followed by nephrologist but no records available   Lab Results  Component Value Date   CREATININE 1.69 (H) 09/28/2021   CREATININE 1.66 (H) 08/02/2021   CREATININE 1.66 (H) 03/01/2021       Physical Examination:  BP 100/60 (BP Location: Left Arm, Patient Position: Sitting, Cuff Size: Normal)   Pulse 91   Ht 5\' 11"  (1.803 m)   Wt 233 lb (105.7 kg)   SpO2 97%   BMI 32.50 kg/m          ASSESSMENT:  Diabetes type 2, insulin requiring with moderate obesity  See history of present illness for detailed discussion of current diabetes management, blood sugar patterns and problems identified  Currently on a regimen of basal bolus insulin with Ozempic 0.5 mg  A1c is 8.4  Although his A1c is high his home blood sugars are looking fairly good but difficult to assess his postprandial readings currently Not clear why his A1c has increased although previously was having tendency to hypoglycemia  His basal insulin appears to be adequate but need to adjust his Humalog based on his meal size and carbohydrates and this was discussed    PLAN:   Continue 0.5 mg  Ozempic weekly He can take 7 or 8 units for high carbohydrate meals such as cereal Emphasized the need for him to check his blood sugars at least 3 times a day, before and after each meal and showed him that his freestyle Elenor Legato data is very inadequate  HYPERLIPIDEMIA: We will need follow-up labs in the next visit with starting atorvastatin   Hypothyroidism: We will have labs checked on his following visit    Patient Instructions  For cereal take 7 Humalog in am  Check blood sugars on waking up    Also check blood  sugars about 2 hours after meals and do this after different meals 2-3 x daily  Recommended blood sugar levels on waking up are 90-130 and about 2 hours after meal is 130-180  Please bring your blood sugar monitor to each visit, thank you   Total visit time including counseling 30 Minutes  Elayne Snare 10/03/2021, 11:34 AM   Note: This office note was prepared with Dragon voice recognition system technology. Any transcriptional errors that result from this process are unintentional.

## 2021-10-03 NOTE — Patient Instructions (Addendum)
For cereal take 7 Humalog in am  Check blood sugars on waking up    Also check blood sugars about 2 hours after meals and do this after different meals 2-3 x daily  Recommended blood sugar levels on waking up are 90-130 and about 2 hours after meal is 130-180  Please bring your blood sugar monitor to each visit, thank you

## 2021-10-22 DIAGNOSIS — Z85828 Personal history of other malignant neoplasm of skin: Secondary | ICD-10-CM | POA: Diagnosis not present

## 2021-10-22 DIAGNOSIS — L570 Actinic keratosis: Secondary | ICD-10-CM | POA: Diagnosis not present

## 2021-10-22 DIAGNOSIS — D225 Melanocytic nevi of trunk: Secondary | ICD-10-CM | POA: Diagnosis not present

## 2021-10-22 DIAGNOSIS — D2271 Melanocytic nevi of right lower limb, including hip: Secondary | ICD-10-CM | POA: Diagnosis not present

## 2021-10-22 DIAGNOSIS — D2272 Melanocytic nevi of left lower limb, including hip: Secondary | ICD-10-CM | POA: Diagnosis not present

## 2021-11-01 ENCOUNTER — Ambulatory Visit (INDEPENDENT_AMBULATORY_CARE_PROVIDER_SITE_OTHER): Payer: Medicare Other | Admitting: Cardiology

## 2021-11-01 ENCOUNTER — Other Ambulatory Visit: Payer: Self-pay

## 2021-11-01 ENCOUNTER — Encounter: Payer: Self-pay | Admitting: Cardiology

## 2021-11-01 DIAGNOSIS — I2510 Atherosclerotic heart disease of native coronary artery without angina pectoris: Secondary | ICD-10-CM

## 2021-11-01 DIAGNOSIS — R001 Bradycardia, unspecified: Secondary | ICD-10-CM | POA: Diagnosis not present

## 2021-11-01 LAB — CUP PACEART INCLINIC DEVICE CHECK
Battery Remaining Longevity: 78 mo
Battery Voltage: 2.9 V
Brady Statistic AP VP Percent: 39.55 %
Brady Statistic AP VS Percent: 0.08 %
Brady Statistic AS VP Percent: 56.61 %
Brady Statistic AS VS Percent: 3.76 %
Brady Statistic RA Percent Paced: 41.64 %
Brady Statistic RV Percent Paced: 96.16 %
Date Time Interrogation Session: 20221222160026
Implantable Lead Implant Date: 20190124
Implantable Lead Implant Date: 20190124
Implantable Lead Location: 753859
Implantable Lead Location: 753860
Implantable Lead Model: 5076
Implantable Lead Model: 5076
Implantable Pulse Generator Implant Date: 20190124
Lead Channel Impedance Value: 304 Ohm
Lead Channel Impedance Value: 342 Ohm
Lead Channel Impedance Value: 342 Ohm
Lead Channel Impedance Value: 437 Ohm
Lead Channel Pacing Threshold Amplitude: 0.625 V
Lead Channel Pacing Threshold Amplitude: 0.75 V
Lead Channel Pacing Threshold Pulse Width: 0.4 ms
Lead Channel Pacing Threshold Pulse Width: 0.4 ms
Lead Channel Sensing Intrinsic Amplitude: 2.375 mV
Lead Channel Sensing Intrinsic Amplitude: 20.125 mV
Lead Channel Sensing Intrinsic Amplitude: 20.125 mV
Lead Channel Sensing Intrinsic Amplitude: 4.25 mV
Lead Channel Setting Pacing Amplitude: 2 V
Lead Channel Setting Pacing Amplitude: 2.5 V
Lead Channel Setting Pacing Pulse Width: 0.4 ms
Lead Channel Setting Sensing Sensitivity: 4 mV

## 2021-11-01 NOTE — Progress Notes (Signed)
He is currently  Electrophysiology Office Note   Date:  11/01/2021   ID:  Steven Maldonado, Steven Maldonado 1941/10/13, MRN [id number]  PCP:  Lorrene Reid, [name]  Cardiologist:  Angelena Form Primary Electrophysiologist:  Constance Haw, MD    No chief complaint on file.     History of Present Illness: Steven Maldonado is a 80 y.o. male who is being seen today for the evaluation of complete AV block at the request of Darlina Guys. Presenting today for electrophysiology evaluation.   He has a history significant for persistent atrial fibrillation, diabetes, hypertension, hyperlipidemia, PAD, coronary artery disease, severe aortic stenosis status post TAVR complicated by heart block.  He is status post Medtronic dual-chamber pacemaker.  He is also status post atrial fibrillation ablation 12/10/2019.  Today, denies symptoms of palpitations, chest pain, shortness of breath, orthopnea, PND, lower extremity edema, claudication, dizziness, presyncope, syncope, bleeding, or neurologic sequela. The patient is tolerating medications without difficulties.  He has been feeling well.  He has had no chest pain or shortness of breath.  Is able do all of his daily activities.  He does not feel that he has been in atrial fibrillation.  He is overall happy with his control.  He is unfortunately had a death in the family over the last few months.  Aside from that, he has no complaints.  Past Medical History:  Diagnosis Date   Chronic diastolic CHF (congestive heart failure) (HCC)    CKD (chronic kidney disease), stage III (HCC)    Constipation    Coronary artery disease    a. Cath February 2012 in [place], occluded RCA with collaterals   DM type 2 (diabetes mellitus, type 2) ([place])    Essential hypertension    Hyperlipidemia    Neuropathy    feet   Pacemaker    a. symptomatic brady after TAVR s/p MDT PPM by Dr. Curt Bears 12/04/17   Persistent atrial fibrillation (HCC)    PONV (postoperative nausea and vomiting)     after valve surgery   PVD (peripheral vascular disease) ([place])    a. s/p R popliteal artery stenosis tx with drug-coated balloon 05/2014, followed by Dr. Fletcher Anon.   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 12/02/17: s/p TAVR   Skin cancer    Sleep apnea with use of continuous positive airway pressure (CPAP)    04-11-11 AHI was 32.9 and titrated to 15 cm H20, DME is [facility]   Subclinical hypothyroidism    Past Surgical History:  Procedure Laterality Date   ABDOMINAL ANGIOGRAM N/A 06/08/2014   Procedure: ABDOMINAL ANGIOGRAM;  Surgeon: Wellington Hampshire, MD;  Location: [place] [facility];  Service: Cardiovascular;  Laterality: N/A;   ABDOMINAL AORTOGRAM N/A 04/09/2018   Procedure: ABDOMINAL AORTOGRAM;  Surgeon: Conrad Shamrock Lakes, MD;  Location: [place] [facility];  Service: Cardiovascular;  Laterality: N/A;   ABDOMINAL AORTOGRAM W/LOWER EXTREMITY N/A 08/23/2020   Procedure: ABDOMINAL AORTOGRAM W/LOWER EXTREMITY;  Surgeon: Wellington Hampshire, MD;  Location: [place] [facility];  Service: Cardiovascular;  Laterality: N/A;   AMPUTATION Left 04/17/2018   Procedure: LEFT FOOT 3RD RAY AMPUTATION;  Surgeon: Newt Minion, MD;  Location: [place];  Service: Orthopedics;  Laterality: Left;   AMPUTATION Left 05/28/2018   Procedure: LEFT AMPUTATION BELOW KNEE;  Surgeon: Wylene Simmer, MD;  Location: [facility];  Service: Orthopedics;  Laterality: Left;   [place] N/A 11/08/2020   Procedure: ATRIAL FIBRILLATION ABLATION;  Surgeon: Constance Haw, MD;  Location: [place] [facility];  Service: Cardiovascular;  Laterality: N/A;   BELOW KNEE LEG AMPUTATION Left 05/28/2018   CARDIAC CATHETERIZATION  12/2010   CARDIOVERSION  07/2011   CARDIOVERSION N/A 04/18/2014   Procedure: CARDIOVERSION;  Surgeon: Dorothy Spark, MD;  Location: [place];   Service: Cardiovascular;  Laterality: N/A;   CARDIOVERSION N/A 11/03/2015   Procedure: CARDIOVERSION;  Surgeon: Lelon Perla, MD;  Location: [facility] ENDOSCOPY;  Service: Cardiovascular;  Laterality: N/A;   CARDIOVERSION N/A 05/08/2017   Procedure: CARDIOVERSION;  Surgeon: Dorothy Spark, MD;  Location: [place];  Service: Cardiovascular;  Laterality: N/A;   CARDIOVERSION N/A 07/28/2017   Procedure: CARDIOVERSION;  Surgeon: Dorothy Spark, MD;  Location: [facility] ENDOSCOPY;  Service: Cardiovascular;  Laterality: N/A;   CARDIOVERSION N/A 02/08/2020   Procedure: CARDIOVERSION;  Surgeon: Buford Dresser, MD;  Location: [facility] ENDOSCOPY;  Service: Cardiovascular;  Laterality: N/A;   CARDIOVERSION N/A 03/15/2020   Procedure: CARDIOVERSION;  Surgeon: Acie Fredrickson Wonda Cheng, MD;  Location: [facility] ENDOSCOPY;  Service: Cardiovascular;  Laterality: N/A;   CARDIOVERSION N/A 09/20/2020   Procedure: CARDIOVERSION;  Surgeon: Werner Lean, MD;  Location: [place];  Service: Cardiovascular;  Laterality: N/A;   LOWER EXTREMITY ANGIOGRAM N/A 06/08/2014   Procedure: LOWER EXTREMITY ANGIOGRAM;  Surgeon: Wellington Hampshire, MD;  Location: [place] CATH LAB;  Service: Cardiovascular;  Laterality: N/A;   LOWER EXTREMITY ANGIOGRAPHY Left 04/09/2018   Procedure: Lower Extremity Angiography;  Surgeon: Conrad Laconia, MD;  Location: [place] CV LAB;  Service: Cardiovascular;  Laterality: Left;   PACEMAKER IMPLANT N/A 12/04/2017   Procedure: PACEMAKER IMPLANT;  Surgeon: Constance Haw, MD;  Location: [place] CV LAB;  Service: Cardiovascular;  Laterality: N/A;   PERIPHERAL VASCULAR ATHERECTOMY  08/23/2020   Procedure: PERIPHERAL VASCULAR ATHERECTOMY;  Surgeon: Wellington Hampshire, MD;  Location: [place] CV LAB;  Service: Cardiovascular;;   PERIPHERAL VASCULAR BALLOON ANGIOPLASTY Left 04/09/2018   Procedure: PERIPHERAL VASCULAR BALLOON ANGIOPLASTY;  Surgeon: Conrad Milan, MD;  Location: [place] CV LAB;   Service: Cardiovascular;  Laterality: Left;  SFA   POPLITEAL ARTERY ANGIOPLASTY Right 06/08/2014   Archie Endo 06/08/2014   RIGHT/LEFT HEART CATH AND CORONARY ANGIOGRAPHY N/A 10/08/2017   Procedure: RIGHT/LEFT HEART CATH AND CORONARY ANGIOGRAPHY;  Surgeon: Burnell Blanks, MD;  Location: [place] [facility];  Service: Cardiovascular;  Laterality: N/A;   SKIN CANCER EXCISION Bilateral    "have had them cut off back of neck X 2; off left upper arm; right wrist, near right shoulder blade" (06/08/2014)   TEE WITHOUT CARDIOVERSION N/A 12/02/2017   Procedure: TRANSESOPHAGEAL ECHOCARDIOGRAM (TEE);  Surgeon: Burnell Blanks, MD;  Location: [place];  Service: Open Heart Surgery;  Laterality: N/A;   TEMPORARY PACEMAKER N/A 12/04/2017   Procedure: TEMPORARY PACEMAKER;  Surgeon: Leonie Man, MD;  Location: [place] [facility];  Service: Cardiovascular;  Laterality: N/A;   TRANSCATHETER AORTIC VALVE REPLACEMENT, TRANSFEMORAL N/A 12/02/2017   Procedure: TRANSCATHETER AORTIC VALVE REPLACEMENT, TRANSFEMORAL;  Surgeon: Burnell Blanks, MD;  Location: [place];  Service: Open Heart Surgery;  Laterality: N/A;  using Edwards Sapien 3 Transcatheter Heart Valve size 73mm     Current Outpatient Medications  Medication Sig Dispense Refill   apixaban (ELIQUIS) 5 MG TABS tablet Take 1 tablet (5 mg total) by mouth  2 (two) times daily. 180 tablet 3   atorvastatin (LIPITOR) 40 MG tablet Take 1 tablet (40 mg total) by mouth daily. 90 tablet 3   BD PEN NEEDLE NANO 2ND GEN 32G X 4 MM MISC USE 4 (FOUR) TIMES DAILY. 200 each 2   Blood Glucose Monitoring Suppl (FREESTYLE LITE) DEVI 1 each by Does not apply route 2 (two) times daily. 1 each 0   Choline Fenofibrate (FENOFIBRIC ACID) 135 MG CPDR TAKE 1 CAPSULE BY MOUTH EVERY DAY 90 capsule 0   docusate sodium (COLACE) 100 MG capsule Take 300 mg by mouth daily.      ezetimibe (ZETIA) 10 MG tablet Take 1 tablet (10 mg total) by mouth daily. 90 tablet 3   FREESTYLE LITE  test strip 1 EACH BY OTHER ROUTE 4 (FOUR) TIMES DAILY - BEFORE MEALS AND AT BEDTIME. 450 strip 3   furosemide (LASIX) 20 MG tablet Take 1 tablet (20 mg total) by mouth every other day.     glipiZIDE (GLUCOTROL XL) 5 MG 24 hr tablet Take 1 tablet (5 mg total) by mouth daily with breakfast. 90 tablet 1   insulin lispro (HUMALOG KWIKPEN) 100 UNIT/ML KwikPen Inject 5-8 Units into the skin See admin instructions. Inject 5-8 units under the skin up to three times daily if eating sweets or high carb foods. 30 mL 3   levothyroxine (SYNTHROID) 200 MCG tablet TAKE 1 TABLET BY MOUTH DAILY MONDAY-SATURDAY  TAKE 1/2 TABLET ON SUNDAY 30 tablet 5   metoprolol tartrate (LOPRESSOR) 50 MG tablet TAKE ONE AND ONE-HALF TABLET BY MOUTH TWICE A DAY 270 tablet 3   mupirocin ointment (BACTROBAN) 2 % Apply to great toe wound daily, with gauze and tape 30 g 0   MYRBETRIQ 25 MG TB24 tablet TAKE 1 TABLET DAILY (Patient taking differently: Take 25 mg by mouth daily.) 90 tablet 2   Semaglutide,0.25 or 0.5MG /DOS, (OZEMPIC, 0.25 OR 0.5 MG/DOSE,) 2 MG/1.5ML SOPN Inject 0.5 mg into the skin once a week. 4.5 mL 1   TRESIBA FLEXTOUCH 200 UNIT/ML FlexTouch Pen INJECT SUBCUTANEOUSLY 26   UNITS AT BEDTIME 18 mL 1   No current facility-administered medications for this visit.    Allergies:   Patient has no known allergies.   Social History:  The patient  reports that he quit smoking about 49 years ago. His smoking use included cigarettes. He has a 28.00 pack-year smoking history. He has never used smokeless tobacco. He reports that he does not drink alcohol and does not use drugs.   Family History:  The patient's family history includes Diabetes in his brother, daughter, father, mother, sister, and another family member; Heart Problems in his daughter; Heart attack in his father and mother; Heart failure in his father; Hypertension in his brother, father, mother, and sister; Stroke in his brother.   ROS:  Please see the history of  present illness.   Otherwise, review of systems is positive for none.   All other systems are reviewed and negative.   PHYSICAL EXAM: VS:  BP (!) 108/52    Pulse 91    Ht 5\' 11"  (1.803 m)    Wt 234 lb (106.1 kg)    SpO2 99%    BMI 32.64 kg/m  , BMI Body mass index is 32.64 kg/m. GEN: Well nourished, well developed, in no acute distress  HEENT: normal  Neck: no JVD, carotid bruits, or masses Cardiac: RRR; no murmurs, rubs, or gallops,no edema  Respiratory:  clear to auscultation bilaterally, normal  work of breathing GI: soft, nontender, nondistended, + BS MS: no deformity or atrophy  Skin: warm and dry, device site well healed Neuro:  Strength and sensation are intact Psych: euthymic mood, full affect  EKG:  EKG is ordered today. Personal review of the ekg ordered 05/08/21 shows AV paced, PVC  Personal review of the device interrogation today. Results in [place]: 08/02/2021: ALT 21; Hemoglobin 13.3; Platelets 137; TSH 3.540 09/28/2021: BUN 28; Creatinine, Ser 1.69; Potassium 4.2; Sodium 138    Lipid Panel     Component Value Date/Time   CHOL 178 08/02/2021 1038   TRIG 164 (H) 08/02/2021 1038   HDL 31 (L) 08/02/2021 1038   CHOLHDL 5.7 (H) 08/02/2021 1038   CHOLHDL 4 03/01/2021 1338   VLDL 28.4 03/01/2021 1338   LDLCALC 118 (H) 08/02/2021 1038   LDLCALC 142 (H) 05/31/2019 1246   LDLDIRECT 123.0 12/03/2019 1136     Wt Readings from Last 3 Encounters:  11/01/21 234 lb (106.1 kg)  10/03/21 233 lb (105.7 kg)  10/02/21 230 lb 1.9 oz (104.4 kg)      Other studies Reviewed: Additional studies/ records that were reviewed today include: TTE 12/24/17  Review of the above records today demonstrates:  - Left ventricle: Inferobasal hypokinesis The cavity size was   normal. Wall thickness was increased in a pattern of severe LVH.   Systolic function was normal. The estimated ejection fraction was   in the range of 55% to 60%. - Aortic valve: Post TAVR with no  significant peri valvualr   regurgitation gradients have increased since 12/03/17. - Mitral valve: Severely calcified annulus. Moderately thickened,   moderately calcified leaflets . There was mild regurgitation. - Left atrium: The atrium was moderately dilated. - Atrial septum: No defect or patent foramen ovale was identified.   ASSESSMENT AND PLAN:  1.  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  Device functioning appropriately.  We Zaiya Annunziato reduce lower rate to 60 bpm.  No other changes.  2.  Persistent atrial fibrillation: Currently on Pradaxa.  CHA2DS2-VASc of 4.  Status post ablation 11/08/2020.  Was initially on amiodarone but this has since been stopped.  Minimal atrial fibrillation since ablation.  No changes.  3.  Hypertension: Currently well controlled  4.  Coronary artery disease: No current chest pain  5.  Hyperlipidemia: Continue statin per primary cardiology  6.  Obstructive sleep apnea: CPAP compliance encouraged   Current medicines are reviewed at length with the patient today.   The patient does not have concerns regarding his medicines.  The following changes were made today: none  Labs/ tests ordered today include:  No orders of the defined types were placed in this encounter.     Disposition:   FU with Emil Weigold 12 months  Signed, Theora Vankirk Meredith Leeds, MD  11/01/2021 2:14 PM     [address] [place] [address] [phone] (office) [phone] (fax)

## 2021-11-01 NOTE — Patient Instructions (Addendum)
Medication Instructions:  Your physician recommends that you continue on your current medications as directed. Please refer to the Current Medication list given to you today.  *If you need a refill on your cardiac medications before your next appointment, please call your pharmacy*   Lab Work: None ordered If you have labs (blood work) drawn today and your tests are completely normal, you will receive your results only by: [place] (if you have MyChart) OR A paper copy in the mail If you have any lab test that is abnormal or we need to change your treatment, we will call you to review the results.   Testing/Procedures: None ordered   Follow-Up: At [facility], you and your health needs are our priority.  As part of our continuing mission to provide you with exceptional heart care, we have created designated Provider Care Teams.  These Care Teams include your primary Cardiologist (physician) and Advanced Practice Providers (APPs -  Physician Assistants and Nurse Practitioners) who all work together to provide you with the care you need, when you need it.  We recommend signing up for the patient portal called "MyChart".  Sign up information is provided on this After Visit Summary.  MyChart is used to connect with patients for Virtual Visits (Telemedicine).  Patients are able to view lab/test results, encounter notes, upcoming appointments, etc.  Non-urgent messages can be sent to your provider as well.   To learn more about what you can do with MyChart, go to [email or web address].    Remote monitoring is used to monitor your Pacemaker or ICD from home. This monitoring reduces the number of office visits required to check your device to one time per year. It allows [place] to keep an eye on the functioning of your device to ensure it is working properly. You are scheduled for a device check from home on 12/24/2021. You may send your transmission at any time that day. If you have a  wireless device, the transmission will be sent automatically. After your physician reviews your transmission, you will receive a postcard with your next transmission date.  Your next appointment:   1 year(s)  The format for your next appointment:   In Person  Provider:   Allegra Lai, M.D.    Thank you for choosing [facility]!!   Trinidad Curet, RN [phone]    Other Instructions

## 2021-11-02 ENCOUNTER — Encounter (INDEPENDENT_AMBULATORY_CARE_PROVIDER_SITE_OTHER): Payer: Medicare Other | Admitting: Ophthalmology

## 2021-11-02 DIAGNOSIS — H353112 Nonexudative age-related macular degeneration, right eye, intermediate dry stage: Secondary | ICD-10-CM | POA: Diagnosis not present

## 2021-11-02 DIAGNOSIS — H35033 Hypertensive retinopathy, bilateral: Secondary | ICD-10-CM

## 2021-11-02 DIAGNOSIS — H43813 Vitreous degeneration, bilateral: Secondary | ICD-10-CM

## 2021-11-02 DIAGNOSIS — E113392 Type 2 diabetes mellitus with moderate nonproliferative diabetic retinopathy without macular edema, left eye: Secondary | ICD-10-CM | POA: Diagnosis not present

## 2021-11-02 DIAGNOSIS — I10 Essential (primary) hypertension: Secondary | ICD-10-CM | POA: Diagnosis not present

## 2021-11-02 DIAGNOSIS — E113311 Type 2 diabetes mellitus with moderate nonproliferative diabetic retinopathy with macular edema, right eye: Secondary | ICD-10-CM | POA: Diagnosis not present

## 2021-11-14 ENCOUNTER — Other Ambulatory Visit: Payer: Self-pay

## 2021-11-14 ENCOUNTER — Telehealth: Payer: Self-pay | Admitting: Endocrinology

## 2021-11-14 DIAGNOSIS — E1165 Type 2 diabetes mellitus with hyperglycemia: Secondary | ICD-10-CM

## 2021-11-14 DIAGNOSIS — Z794 Long term (current) use of insulin: Secondary | ICD-10-CM

## 2021-11-14 MED ORDER — GLIPIZIDE ER 5 MG PO TB24: 5.0000 mg | ORAL_TABLET | Freq: Every day | ORAL | 1 refills | Status: AC

## 2021-11-14 MED ORDER — ATORVASTATIN CALCIUM 40 MG PO TABS: 40.0000 mg | ORAL_TABLET | Freq: Every day | ORAL | 3 refills | Status: AC

## 2021-11-14 NOTE — Telephone Encounter (Signed)
MEDICATION:  glipiZIDE (GLUCOTROL XL) 5 MG 24 hr tablet    AND    atorvastatin (LIPITOR) 40 MG tablet  PHARMACY:   [id number] [name], [place] - [address] Phone:  [phone]  Fax:  [phone]      HAS THE PATIENT CONTACTED THEIR PHARMACY?  Yes-requires new RX's  IS THIS A 90 DAY SUPPLY : Yes  IS PATIENT OUT OF MEDICATION: No  IF NOT; HOW MUCH IS LEFT: Approx. 1 week  LAST APPOINTMENT DATE: @11 /23/2022  NEXT APPOINTMENT DATE:@2 /27/2023  DO WE HAVE YOUR PERMISSION TO LEAVE A DETAILED MESSAGE?: Yes  OTHER COMMENTS:    **Let patient know to contact pharmacy at the end of the day to make sure medication is ready. **  ** Please notify patient to allow 48-72 hours to process**  **Encourage patient to contact the pharmacy for refills or they can request refills through [facility]**

## 2021-11-14 NOTE — Telephone Encounter (Signed)
Medication sent in to preferred pharmacy.

## 2021-11-21 ENCOUNTER — Other Ambulatory Visit: Payer: Self-pay | Admitting: *Deleted

## 2021-11-21 NOTE — Patient Outreach (Signed)
[facility]) Care Management  11/21/2021  Steven Maldonado 03-16-1941 [id number]   RN Health Coach attempted follow up outreach call to patient.  Patient was in the car on the way to lunch.  Patient asked for call back.  Plan: RN will call patient again within 30 days.  [facility] Care Management [phone]

## 2021-11-23 ENCOUNTER — Other Ambulatory Visit: Payer: Self-pay | Admitting: *Deleted

## 2021-11-23 ENCOUNTER — Other Ambulatory Visit: Payer: Self-pay | Admitting: Physician Assistant

## 2021-11-23 NOTE — Patient Outreach (Signed)
[facility]) Care Management [place] Note   11/23/2021 Name:  Steven Maldonado MRN:  [id number] DOB:  1941/08/25  Summary: FBS 110. A1C . A1C 8.4. Per patient he has not had any hypoglycemic reactions.  Patient appetite is good. He stated that he is not eating any fried foods. Patient stated he has been trying to loose some weight. Per patient he has a pill box that he use.  Patient did not remember what all the medications he is taking. [place] bruising on knee. Per patient he has not been using his CPAP due to skin cancer and sutures .  Recommendations/Changes made from today's visit: RN went over all the medication usage Patient will take medication as per ordered Patient will monitor blood sugar   Subjective: Steven Maldonado is an 81 y.o. year old male who is a primary patient of Steven Reid, [name]. The care management team was consulted for assistance with care management and/or care coordination Maldonado.    RN Health Coach completed Telephone Visit today.   Objective:  Medications Reviewed Today     Reviewed by Verlin Grills, RN (Case Manager) on 11/23/21 at 1208  Med List Status: <None>   Medication Order Taking? Sig Documenting Provider Last Dose Status Informant  apixaban (ELIQUIS) 5 MG TABS tablet [id number] Yes Take 1 tablet (5 mg total) by mouth 2 (two) times daily. Steven Burn, [name] Taking Active   atorvastatin (LIPITOR) 40 MG tablet [id number] Yes Take 1 tablet (40 mg total) by mouth daily. Steven Snare, MD Taking Active   BD PEN NEEDLE NANO 2ND GEN 32G X 4 MM MISC [id number] Yes USE 4 (FOUR) TIMES DAILY. Steven Snare, MD Taking Active   Blood Glucose Monitoring Suppl ([place]) DEVI [id number] Yes 1 each by Does not apply route 2 (two) times daily. [name], [name] Taking Active Self  Choline Fenofibrate (FENOFIBRIC ACID) 135 MG CPDR [id number] Yes TAKE 1 CAPSULE BY MOUTH EVERY DAY Abonza, Maritza, [name]  Taking Active   docusate sodium (COLACE) 100 MG capsule [id number]  Take 300 mg by mouth daily.  [provider]  Active Self  ezetimibe (ZETIA) 10 MG tablet [id number] Yes Take 1 tablet (10 mg total) by mouth daily. Steven Snare, MD Taking Active   FREESTYLE LITE test strip [id number]  1 EACH BY OTHER ROUTE 4 (FOUR) TIMES DAILY - BEFORE MEALS AND AT BEDTIME. Steven Snare, MD  Active Self  furosemide (LASIX) 20 MG tablet [id number] Yes Take 1 tablet (20 mg total) by mouth every other day. Steven Needs, NP Taking Active            Med Note Howell Rucks, Eppie Gibson   Fri Nov 23, 2021 11:45 AM) Dewaine Conger 4 days a week  glipiZIDE (GLUCOTROL XL) 5 MG 24 hr tablet [id number]  Take 1 tablet (5 mg total) by mouth daily with breakfast. Steven Snare, MD  Active   insulin lispro (HUMALOG KWIKPEN) 100 UNIT/ML KwikPen [id number]  Inject 5-8 Units into the skin See admin instructions. Inject 5-8 units under the skin up to three times daily if eating sweets or high carb foods. Steven Snare, MD  Active   levothyroxine (SYNTHROID) 200 MCG tablet [id number] Yes TAKE 1 TABLET BY MOUTH DAILY MONDAY-SATURDAY  TAKE 1/2 TABLET ON Steven Bateman, MD Taking Active   metoprolol tartrate (LOPRESSOR) 50 MG tablet [id number] Yes TAKE ONE AND ONE-HALF TABLET BY MOUTH TWICE A DAY Steven Maldonado  A, MD Taking Active   mupirocin ointment (BACTROBAN) 2 % [id number]  Apply to great toe wound daily, with gauze and tape Steven Reid, [name]  Active   MYRBETRIQ 25 MG TB24 tablet [id number]  TAKE 1 TABLET DAILY  Patient taking differently: Take 25 mg by mouth daily.   Steven Reid, [name]  Active   Semaglutide,0.25 or 0.5MG /DOS, (OZEMPIC, 0.25 OR 0.5 MG/DOSE,) 2 MG/1.5ML SOPN [id number] Yes Inject 0.5 mg into the skin once a week. Steven Snare, MD Taking Active   TRESIBA FLEXTOUCH 200 UNIT/ML FlexTouch Pen [id number]  INJECT SUBCUTANEOUSLY 26   UNITS AT BEDTIME Steven Reid, [name]  Active              SDOH:  (Social Determinants of  Health) assessments and interventions performed:  SDOH Interventions    Flowsheet Row Most Recent Value  SDOH Interventions   Food Insecurity Interventions Intervention Not Indicated  Housing Interventions Intervention Not Indicated  Transportation Interventions Intervention Not Indicated       Care Plan  Review of patient past medical history, allergies, medications, health status, including review of consultants reports, laboratory and other test data, was performed as part of comprehensive evaluation for care management services.   Care Plan : Diabetes Type 2 (Adult)  Updates made by Verlin Grills, RN since 11/23/2021 12:00 AM     Problem: Glycemic Management (Diabetes, Type 2) Resolved 11/23/2021  Priority: High  Onset Date: 09/25/2020  Note:   [id number] Resolving due to duplicate goal     Long-Range Goal: Glycemic Management Optimized Completed 11/23/2021  Start Date: 09/25/2020  Expected End Date: 11/09/2021  Recent Progress: On track  Priority: High  Note:   Evidence-based guidance:  Anticipate A1C testing (point-of-care) every 3 to 6 months based on goal attainment.  Review mutually-set A1C goal or target range.  Anticipate use of antihyperglycemic with or without insulin and periodic adjustments; consider active involvement of pharmacist.  Provide medical nutrition therapy and development of individualized eating.  Compare self-reported symptoms of hypo or hyperglycemia to blood glucose levels, diet and fluid intake, current medications, psychosocial and physiologic stressors, change in activity and barriers to care adherence.  Promote self-monitoring of blood glucose levels.  Assess and address barriers to management plan, such as food insecurity, age, developmental ability, depression, anxiety, fear of hypoglycemia or weight gain, as well as medication cost, side effects and complicated regimen.  Consider referral to community-based diabetes education program,  visiting nurse, community health worker or health coach.  Encourage regular dental care for treatment of periodontal disease; refer to dental provider when needed.   Notes:     Task: Alleviate Barriers to Glycemic Management Completed 11/23/2021  Due Date: 11/09/2021  Note:   Care Management Activities:    - barriers to adherence to treatment plan identified - blood glucose monitoring encouraged - mutual A1C goal set or reviewed - use of blood glucose monitoring log promoted    Notes:     Problem: Disease Progression (Diabetes, Type 2) Resolved 11/23/2021  Priority: Medium  Onset Date: 09/25/2020  Note:   [id number] Resolving due to duplicate goal     Long-Range Goal: Disease Progression Prevented or Minimized Completed 11/23/2021  Start Date: 09/25/2020  Expected End Date: 11/09/2021  Recent Progress: On track  Priority: Medium  Note:   Evidence-based guidance:  Prepare patient for laboratory and diagnostic exams based on risk and presentation.  Encourage lifestyle changes, such as increased intake of plant-based foods, stress reduction, consistent physical activity and  smoking cessation to prevent long-term complications and chronic disease.   Individualize activity and exercise recommendations while considering potential limitations, such as neuropathy, retinopathy or the ability to prevent hyperglycemia or hypoglycemia.   Prepare patient for use of pharmacologic therapy that may include antihypertensive, analgesic, prostaglandin E1 with periodic adjustments, based on presenting chronic condition and laboratory results.  Assess signs/symptoms and risk factors for hypertension, sleep-disordered breathing, neuropathy (including changes in gait and balance), retinopathy, nephropathy and sexual dysfunction.  Address pregnancy planning and contraceptive choice, especially when prescribing antihypertensive or statin.  Ensure completion of annual comprehensive foot exam and dilated eye  exam.   Implement additional individualized goals and interventions based on identified risk factors.  Prepare patient for consultation or referral for specialist care, such as ophthalmology, neurology, cardiology, podiatry, nephrology or perinatology.   Notes:     Task: Monitor and Manage Follow-up for Comorbidities Completed 11/23/2021  Due Date: 11/09/2021  Note:   Care Management Activities:    - activity based on tolerance and functional limitations encouraged - healthy lifestyle promoted - reduction of sedentary activity encouraged - signs/symptoms of comorbidities identified    Notes:     Care Plan : RN Care Manager Plan of Care  Updates made by Shavona Gunderman, Eppie Gibson, RN since 11/23/2021 12:00 AM     Problem: Knowledge Deficit Related to Diabetes and Care Coordination   Priority: High     Long-Range Goal: Development Plan of Care for Management of Diabetes   Start Date: 11/23/2021  Expected End Date: 11/09/2022  Priority: High  Note:   Current Barriers:  Knowledge Deficits related to plan of care for management of DMII   RNCM Clinical Goal(s):  Patient will verbalize understanding of plan for management of DMII as evidenced by continuation of monitoring blood sugars and adhering to diabetic diet  through collaboration with RN Care manager, provider, and care team.   Interventions: Inter-disciplinary care team collaboration (see longitudinal plan of care) Evaluation of current treatment plan related to  self management and patient's adherence to plan as established by provider  Patient Goals/Self-Care Activities: Take medications as prescribed   Attend all scheduled provider appointments Call pharmacy for medication refills 3-7 days in advance of running out of medications Attend church or other social activities Perform all self care activities independently  Perform IADL's (shopping, preparing meals, housekeeping, managing finances) independently Call provider  office for new concerns or questions  call the Suicide and Crisis Lifeline: 988 if experiencing a Mental Health or [place]  schedule appointment with eye doctor check blood sugar at prescribed times: three times daily check feet daily for cuts, sores or redness take the blood sugar meter to all doctor visits trim toenails straight across drink 6 to 8 glasses of water each day manage portion size        Plan: Telephone follow up appointment with care management team member scheduled for:  March 04, 2022 The patient has been provided with contact information for the care management team and has been advised to call with any health related questions or concerns.   [facility] [phone]

## 2021-11-23 NOTE — Patient Instructions (Signed)
Visit Information  Thank you for taking time to visit with me today. Please don't hesitate to contact me if I can be of assistance to you before our next scheduled telephone appointment.  Following are the goals we discussed today:  Current Barriers:  Knowledge Deficits related to plan of care for management of DMII   RNCM Clinical Goal(s):  Patient will verbalize understanding of plan for management of DMII as evidenced by continuation of monitoring blood sugars and adhering to diabetic diet through collaboration with RN Care manager, provider, and care team.   Interventions: Inter-disciplinary care team collaboration (see longitudinal plan of care) Evaluation of current treatment plan related to  self management and patient's adherence to plan as established by provider  Patient Goals/Self-Care Activities: Take medications as prescribed   Attend all scheduled provider appointments Call pharmacy for medication refills 3-7 days in advance of running out of medications Attend church or other social activities Perform all self care activities independently  Perform IADL's (shopping, preparing meals, housekeeping, managing finances) independently Call provider office for new concerns or questions  call the Suicide and Crisis Lifeline: 988 if experiencing a Mental Health or [place]  schedule appointment with eye doctor check blood sugar at prescribed times: three times daily check feet daily for cuts, sores or redness take the blood sugar meter to all doctor visits trim toenails straight across drink 6 to 8 glasses of water each day manage portion size    Our next appointment is by telephone on March 04, 2022  Please call the [place] at [phone] if you need to cancel or reschedule your appointment.   Please call the Suicide and Crisis Lifeline: 988 if you are experiencing a Mental Health or [place] or need someone to talk to.  The  patient verbalized understanding of instructions, educational materials, and care plan provided today and agreed to receive a mailed copy of patient instructions, educational materials, and care plan.   Telephone follow up appointment with care management team member scheduled for: The patient has been provided with contact information for the care management team and has been advised to call with any health related questions or concerns.   [facility] Care Management [phone]

## 2021-12-04 ENCOUNTER — Ambulatory Visit (INDEPENDENT_AMBULATORY_CARE_PROVIDER_SITE_OTHER): Payer: Medicare Other | Admitting: Podiatry

## 2021-12-04 ENCOUNTER — Other Ambulatory Visit: Payer: Self-pay

## 2021-12-04 DIAGNOSIS — M79674 Pain in right toe(s): Secondary | ICD-10-CM

## 2021-12-04 DIAGNOSIS — M79675 Pain in left toe(s): Secondary | ICD-10-CM | POA: Diagnosis not present

## 2021-12-04 DIAGNOSIS — B351 Tinea unguium: Secondary | ICD-10-CM

## 2021-12-04 DIAGNOSIS — E1142 Type 2 diabetes mellitus with diabetic polyneuropathy: Secondary | ICD-10-CM

## 2021-12-04 DIAGNOSIS — I739 Peripheral vascular disease, unspecified: Secondary | ICD-10-CM

## 2021-12-06 DIAGNOSIS — E113393 Type 2 diabetes mellitus with moderate nonproliferative diabetic retinopathy without macular edema, bilateral: Secondary | ICD-10-CM | POA: Diagnosis not present

## 2021-12-06 DIAGNOSIS — H40013 Open angle with borderline findings, low risk, bilateral: Secondary | ICD-10-CM | POA: Diagnosis not present

## 2021-12-06 DIAGNOSIS — H40032 Anatomical narrow angle, left eye: Secondary | ICD-10-CM | POA: Diagnosis not present

## 2021-12-07 ENCOUNTER — Ambulatory Visit: Payer: Medicare Other | Admitting: Podiatry

## 2021-12-08 ENCOUNTER — Encounter: Payer: Self-pay | Admitting: Podiatry

## 2021-12-08 NOTE — Progress Notes (Signed)
°  Subjective:  Patient ID: Steven Maldonado, male    DOB: Aug 06, 1941,  MRN: [id number]  Chief Complaint  Patient presents with   Diabetes      at risk diabetic foot care   Nail Problem    Thick painful toenails    81 y.o. male presents with the above complaint. History confirmed with patient.  He is doing well no new issues since his last visit.   Objective:  Physical Exam: warm, good capillary refill, no trophic changes or ulcerative lesions, normal DP and PT pulses, and normal sensory exam.  Right Foot: dystrophic yellowed discolored nail plates with subungual debris and submetatarsal 5 callus  Assessment:   1. Pain due to onychomycosis of toenail of right foot   2. PAD (peripheral artery disease) ([place])   3. Diabetic peripheral neuropathy associated with type 2 diabetes mellitus ([place])       Plan:  Patient was evaluated and treated and all questions answered.  Patient educated on diabetes. Discussed proper diabetic foot care and discussed risks and complications of disease. Educated patient in depth on reasons to return to the office immediately should he/she discover anything concerning or new on the feet. All questions answered. Discussed proper shoes as well.   Discussed the etiology and treatment options for the condition in detail with the patient. Educated patient on the topical and oral treatment options for mycotic nails. Recommended debridement of the nails today. Sharp and mechanical debridement performed of all painful and mycotic nails today. Nails debrided in length and thickness using a nail nipper to level of comfort. Discussed treatment options including appropriate shoe gear. Follow up as needed for painful nails.      Return in about 4 months (around 04/03/2022) for at risk diabetic foot care.

## 2021-12-12 ENCOUNTER — Other Ambulatory Visit: Payer: Self-pay

## 2021-12-12 ENCOUNTER — Encounter (INDEPENDENT_AMBULATORY_CARE_PROVIDER_SITE_OTHER): Payer: Medicare Other | Admitting: Ophthalmology

## 2021-12-12 DIAGNOSIS — H35033 Hypertensive retinopathy, bilateral: Secondary | ICD-10-CM | POA: Diagnosis not present

## 2021-12-12 DIAGNOSIS — H43813 Vitreous degeneration, bilateral: Secondary | ICD-10-CM

## 2021-12-12 DIAGNOSIS — E113393 Type 2 diabetes mellitus with moderate nonproliferative diabetic retinopathy without macular edema, bilateral: Secondary | ICD-10-CM

## 2021-12-12 DIAGNOSIS — H353112 Nonexudative age-related macular degeneration, right eye, intermediate dry stage: Secondary | ICD-10-CM | POA: Diagnosis not present

## 2021-12-12 DIAGNOSIS — I10 Essential (primary) hypertension: Secondary | ICD-10-CM | POA: Diagnosis not present

## 2021-12-24 ENCOUNTER — Ambulatory Visit (INDEPENDENT_AMBULATORY_CARE_PROVIDER_SITE_OTHER): Payer: Medicare Other

## 2021-12-24 ENCOUNTER — Telehealth: Payer: Self-pay | Admitting: Physician Assistant

## 2021-12-24 DIAGNOSIS — I442 Atrioventricular block, complete: Secondary | ICD-10-CM

## 2021-12-24 NOTE — Telephone Encounter (Signed)
Patient is aware 

## 2021-12-24 NOTE — Telephone Encounter (Signed)
Patients Levothyroxine previously prescribed by Dr. Dwyane Dee. Patient will need to reach out to them for refills> AS, CMA

## 2021-12-24 NOTE — Telephone Encounter (Signed)
Patient is requesting refill of his Levothyroxine 200 mcg and would like it sent to [facility] for a 90 day supply. Please advise. [phone]

## 2021-12-25 ENCOUNTER — Other Ambulatory Visit: Payer: Self-pay

## 2021-12-25 DIAGNOSIS — E039 Hypothyroidism, unspecified: Secondary | ICD-10-CM

## 2021-12-25 LAB — CUP PACEART REMOTE DEVICE CHECK
Battery Remaining Longevity: 77 mo
Battery Voltage: 2.9 V
Brady Statistic AP VP Percent: 18.02 %
Brady Statistic AP VS Percent: 0.03 %
Brady Statistic AS VP Percent: 78.91 %
Brady Statistic AS VS Percent: 3.04 %
Brady Statistic RA Percent Paced: 20.05 %
Brady Statistic RV Percent Paced: 96.93 %
Date Time Interrogation Session: 20230214134939
Implantable Lead Implant Date: 20190124
Implantable Lead Implant Date: 20190124
Implantable Lead Location: 753859
Implantable Lead Location: 753860
Implantable Lead Model: 5076
Implantable Lead Model: 5076
Implantable Pulse Generator Implant Date: 20190124
Lead Channel Impedance Value: 323 Ohm
Lead Channel Impedance Value: 361 Ohm
Lead Channel Impedance Value: 380 Ohm
Lead Channel Impedance Value: 456 Ohm
Lead Channel Pacing Threshold Amplitude: 0.625 V
Lead Channel Pacing Threshold Amplitude: 0.625 V
Lead Channel Pacing Threshold Pulse Width: 0.4 ms
Lead Channel Pacing Threshold Pulse Width: 0.4 ms
Lead Channel Sensing Intrinsic Amplitude: 20.125 mV
Lead Channel Sensing Intrinsic Amplitude: 20.125 mV
Lead Channel Sensing Intrinsic Amplitude: 3.375 mV
Lead Channel Sensing Intrinsic Amplitude: 3.375 mV
Lead Channel Setting Pacing Amplitude: 2 V
Lead Channel Setting Pacing Amplitude: 2.5 V
Lead Channel Setting Pacing Pulse Width: 0.4 ms
Lead Channel Setting Sensing Sensitivity: 4 mV

## 2021-12-25 MED ORDER — LEVOTHYROXINE SODIUM 200 MCG PO TABS: ORAL_TABLET | ORAL | 2 refills | Status: AC

## 2021-12-26 NOTE — Progress Notes (Signed)
Remote pacemaker transmission.   

## 2021-12-31 ENCOUNTER — Other Ambulatory Visit: Payer: Self-pay

## 2021-12-31 MED ORDER — FENOFIBRIC ACID 135 MG PO CPDR: 1.0000 | DELAYED_RELEASE_CAPSULE | Freq: Every day | ORAL | 0 refills | Status: DC

## 2022-01-07 ENCOUNTER — Other Ambulatory Visit: Payer: Self-pay

## 2022-01-07 ENCOUNTER — Other Ambulatory Visit: Payer: Medicare Other

## 2022-01-07 DIAGNOSIS — E1165 Type 2 diabetes mellitus with hyperglycemia: Secondary | ICD-10-CM

## 2022-01-07 MED ORDER — BD PEN NEEDLE NANO 2ND GEN 32G X 4 MM MISC: 2 refills | Status: AC

## 2022-01-08 ENCOUNTER — Other Ambulatory Visit: Payer: Self-pay

## 2022-01-08 ENCOUNTER — Encounter: Payer: Self-pay | Admitting: Cardiovascular Disease

## 2022-01-08 ENCOUNTER — Ambulatory Visit (INDEPENDENT_AMBULATORY_CARE_PROVIDER_SITE_OTHER): Payer: Medicare Other | Admitting: Cardiovascular Disease

## 2022-01-08 VITALS — BP 120/60 | HR 71 | Resp 20 | Ht 71.0 in | Wt 233.4 lb

## 2022-01-08 DIAGNOSIS — I739 Peripheral vascular disease, unspecified: Secondary | ICD-10-CM

## 2022-01-08 DIAGNOSIS — I2510 Atherosclerotic heart disease of native coronary artery without angina pectoris: Secondary | ICD-10-CM | POA: Diagnosis not present

## 2022-01-08 DIAGNOSIS — E785 Hyperlipidemia, unspecified: Secondary | ICD-10-CM

## 2022-01-08 DIAGNOSIS — Z952 Presence of prosthetic heart valve: Secondary | ICD-10-CM

## 2022-01-08 NOTE — Patient Instructions (Signed)
Medication Instructions:  No changes *If you need a refill on your cardiac medications before your next appointment, please call your pharmacy*   Lab Work: None ordered If you have labs (blood work) drawn today and your tests are completely normal, you will receive your results only by: [place] (if you have MyChart) OR A paper copy in the mail If you have any lab test that is abnormal or we need to change your treatment, we will call you to review the results.   Testing/Procedures: Your physician has requested that you have a lower extremity arterial duplex. During this test, ultrasound is used to evaluate arterial blood flow in the legs. Allow one hour for this exam. There are no restrictions or special instructions. This will take place at [place], [address].  Your physician has requested that you have an ankle brachial index (ABI). During this test an ultrasound and blood pressure cuff are used to evaluate the arteries that supply the arms and legs with blood. Allow thirty minutes for this exam. There are no restrictions or special instructions. This will take place at [place], [address].   Follow-Up: At [facility], you and your health needs are our priority.  As part of our continuing mission to provide you with exceptional heart care, we have created designated Provider Care Teams.  These Care Teams include your primary Cardiologist (physician) and Advanced Practice Providers (APPs -  Physician Assistants and Nurse Practitioners) who all work together to provide you with the care you need, when you need it.  We recommend signing up for the patient portal called "MyChart".  Sign up information is provided on this After Visit Summary.  MyChart is used to connect with patients for Virtual Visits (Telemedicine).  Patients are able to view lab/test results, encounter notes, upcoming appointments, etc.  Non-urgent messages can be sent to your provider as well.    To learn more about what you can do with MyChart, go to [email or web address].    Your next appointment:   6 month(s)  The format for your next appointment:   In Person  Provider:   Dr. Fletcher Anon

## 2022-01-08 NOTE — Progress Notes (Signed)
Cardiology Office Note   Date:  01/08/2022   ID:  Tyquavious, Gamel [birth year], MRN [id number]  PCP:  Lorrene Reid, [name]  Cardiologist:  Dr. Angelena Form  No chief complaint on file.      History of Present Illness: Steven Maldonado is a 81 y.o. male who presents for  a followup visit regarding  PAD . 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 He is followed by me for peripheral arterial disease.  I performed successful angioplasty and drug-coated balloon angioplasty to the right popliteal artery in 2015.  He was subsequently seen by Dr. 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   He was seen in 2021 for small ulceration on the right great toe .  He had Doppler studies done which showed an occluded heavily calcified distal SFA/popliteal artery. Angiography in October of 2021 showed no significant aortoiliac disease.  On the right side, there was severe heavily calcified stenosis in the distal SFA with heavily calcified occluded popliteal artery above the knee with severe stenosis affecting the distal anterior tibial artery, occluded distal posterior tibial artery and occluded distal peroneal artery.  I performed successful orbital atherectomy and drug-coated balloon angioplasty to the right distal SFA and popliteal arteries with excellent results.     He has been doing well overall with no claudication or lower extremity ulceration.  He continues to use his left leg prosthesis without issues.  Past Medical History:  Diagnosis Date   Chronic diastolic CHF (congestive heart failure) (HCC)    CKD (chronic kidney disease), stage III (HCC)    Constipation    Coronary artery disease    a. Cath February 2012 in [facility], occluded RCA with collaterals    DM type 2 (diabetes mellitus, type 2) ([place])    Essential hypertension    Hyperlipidemia    Neuropathy    feet   Pacemaker    a. symptomatic brady after TAVR s/p MDT PPM by Dr. Curt Bears 12/04/17   Persistent atrial fibrillation (HCC)    PONV (postoperative nausea and vomiting)    after valve surgery   PVD (peripheral vascular disease) ([place])    a. s/p R popliteal artery stenosis tx with drug-coated balloon 05/2014, followed by Dr. Fletcher Anon.   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 12/02/17: s/p TAVR   Skin cancer    Sleep apnea with use of continuous positive airway pressure (CPAP)    04-11-11 AHI was 32.9 and titrated to 15 cm H20, DME is [facility]   Subclinical hypothyroidism     Past Surgical History:  Procedure Laterality Date   ABDOMINAL ANGIOGRAM N/A 06/08/2014   Procedure: ABDOMINAL ANGIOGRAM;  Surgeon: Wellington Hampshire, MD;  Location: [place] CATH LAB;  Service: Cardiovascular;  Laterality: N/A;   ABDOMINAL AORTOGRAM N/A 04/09/2018   Procedure: ABDOMINAL AORTOGRAM;  Surgeon: Conrad Geauga, MD;  Location: [place] [facility];  Service: Cardiovascular;  Laterality: N/A;   ABDOMINAL AORTOGRAM W/LOWER EXTREMITY N/A 08/23/2020   Procedure: ABDOMINAL AORTOGRAM W/LOWER EXTREMITY;  Surgeon: Wellington Hampshire, MD;  Location: [place] CV LAB;  Service: Cardiovascular;  Laterality: N/A;  AMPUTATION Left 04/17/2018   Procedure: LEFT FOOT 3RD RAY AMPUTATION;  Surgeon: Newt Minion, MD;  Location: [place];  Service: Orthopedics;  Laterality: Left;   AMPUTATION Left 05/28/2018   Procedure: LEFT AMPUTATION BELOW KNEE;  Surgeon: Wylene Simmer, MD;  Location: [place];  Service: Orthopedics;  Laterality: Left;   [place] N/A 11/08/2020   Procedure: ATRIAL FIBRILLATION ABLATION;  Surgeon: Constance Haw, MD;  Location:  [place] CV LAB;  Service: Cardiovascular;  Laterality: N/A;   BELOW KNEE LEG AMPUTATION Left 05/28/2018   CARDIAC CATHETERIZATION  12/2010   CARDIOVERSION  07/2011   CARDIOVERSION N/A 04/18/2014   Procedure: CARDIOVERSION;  Surgeon: Dorothy Spark, MD;  Location: [place];  Service: Cardiovascular;  Laterality: N/A;   CARDIOVERSION N/A 11/03/2015   Procedure: CARDIOVERSION;  Surgeon: Lelon Perla, MD;  Location: [facility] ENDOSCOPY;  Service: Cardiovascular;  Laterality: N/A;   CARDIOVERSION N/A 05/08/2017   Procedure: CARDIOVERSION;  Surgeon: Dorothy Spark, MD;  Location: [place];  Service: Cardiovascular;  Laterality: N/A;   CARDIOVERSION N/A 07/28/2017   Procedure: CARDIOVERSION;  Surgeon: Dorothy Spark, MD;  Location: [facility] ENDOSCOPY;  Service: Cardiovascular;  Laterality: N/A;   CARDIOVERSION N/A 02/08/2020   Procedure: CARDIOVERSION;  Surgeon: Buford Dresser, MD;  Location: [facility] ENDOSCOPY;  Service: Cardiovascular;  Laterality: N/A;   CARDIOVERSION N/A 03/15/2020   Procedure: CARDIOVERSION;  Surgeon: Acie Fredrickson Wonda Cheng, MD;  Location: [facility] ENDOSCOPY;  Service: Cardiovascular;  Laterality: N/A;   CARDIOVERSION N/A 09/20/2020   Procedure: CARDIOVERSION;  Surgeon: Werner Lean, MD;  Location: [place];  Service: Cardiovascular;  Laterality: N/A;   LOWER EXTREMITY ANGIOGRAM N/A 06/08/2014   Procedure: LOWER EXTREMITY ANGIOGRAM;  Surgeon: Wellington Hampshire, MD;  Location: [place] CATH LAB;  Service: Cardiovascular;  Laterality: N/A;   LOWER EXTREMITY ANGIOGRAPHY Left 04/09/2018   Procedure: Lower Extremity Angiography;  Surgeon: Conrad Orleans, MD;  Location: [place] CV LAB;  Service: Cardiovascular;  Laterality: Left;   PACEMAKER IMPLANT N/A 12/04/2017   Procedure: PACEMAKER IMPLANT;  Surgeon: Constance Haw, MD;  Location: [place] CV LAB;  Service: Cardiovascular;  Laterality: N/A;   PERIPHERAL VASCULAR ATHERECTOMY  08/23/2020   Procedure: PERIPHERAL  VASCULAR ATHERECTOMY;  Surgeon: Wellington Hampshire, MD;  Location: [place] CV LAB;  Service: Cardiovascular;;   PERIPHERAL VASCULAR BALLOON ANGIOPLASTY Left 04/09/2018   Procedure: PERIPHERAL VASCULAR BALLOON ANGIOPLASTY;  Surgeon: Conrad Emporia, MD;  Location: [place] CV LAB;  Service: Cardiovascular;  Laterality: Left;  SFA   POPLITEAL ARTERY ANGIOPLASTY Right 06/08/2014   Archie Endo 06/08/2014   RIGHT/LEFT HEART CATH AND CORONARY ANGIOGRAPHY N/A 10/08/2017   Procedure: RIGHT/LEFT HEART CATH AND CORONARY ANGIOGRAPHY;  Surgeon: Burnell Blanks, MD;  Location: [place] CV LAB;  Service: Cardiovascular;  Laterality: N/A;   SKIN CANCER EXCISION Bilateral    "have had them cut off back of neck X 2; off left upper arm; right wrist, near right shoulder blade" (06/08/2014)   TEE WITHOUT CARDIOVERSION N/A 12/02/2017   Procedure: TRANSESOPHAGEAL ECHOCARDIOGRAM (TEE);  Surgeon: Burnell Blanks, MD;  Location: [place];  Service: Open Heart Surgery;  Laterality: N/A;   TEMPORARY PACEMAKER N/A 12/04/2017   Procedure: TEMPORARY PACEMAKER;  Surgeon: Leonie Man, MD;  Location: [place] CV LAB;  Service: Cardiovascular;  Laterality: N/A;   TRANSCATHETER AORTIC VALVE REPLACEMENT, TRANSFEMORAL N/A 12/02/2017   Procedure: TRANSCATHETER AORTIC VALVE REPLACEMENT, TRANSFEMORAL;  Surgeon: Burnell Blanks, MD;  Location: [facility] OR;  Service: Open Heart Surgery;  Laterality: N/A;  using Edwards Sapien 3 Transcatheter Heart Valve size 23mm     Current Outpatient Medications  Medication Sig Dispense Refill   apixaban (ELIQUIS) 5 MG TABS tablet Take 1 tablet (5 mg total) by mouth 2 (two) times daily. 180 tablet 3   atorvastatin (LIPITOR) 40 MG tablet Take 1 tablet (40 mg total) by mouth daily. 90 tablet 3   Blood Glucose Monitoring Suppl (FREESTYLE LITE) DEVI 1 each by Does not apply route 2 (two) times daily. 1 each 0   Choline Fenofibrate (FENOFIBRIC ACID) 135 MG CPDR Take 1 tablet by mouth  daily. 90 capsule 0   docusate sodium (COLACE) 100 MG capsule Take 300 mg by mouth daily.      ezetimibe (ZETIA) 10 MG tablet Take 1 tablet (10 mg total) by mouth daily. 90 tablet 3   FREESTYLE LITE test strip 1 EACH BY OTHER ROUTE 4 (FOUR) TIMES DAILY - BEFORE MEALS AND AT BEDTIME. 450 strip 3   furosemide (LASIX) 20 MG tablet Take 1 tablet (20 mg total) by mouth every other day.     glipiZIDE (GLUCOTROL XL) 5 MG 24 hr tablet Take 1 tablet (5 mg total) by mouth daily with breakfast. 90 tablet 1   insulin lispro (HUMALOG KWIKPEN) 100 UNIT/ML KwikPen Inject 5-8 Units into the skin See admin instructions. Inject 5-8 units under the skin up to three times daily if eating sweets or high carb foods. 30 mL 3   Insulin Pen Needle (BD PEN NEEDLE NANO 2ND GEN) 32G X 4 MM MISC USE 4 (FOUR) TIMES DAILY. 200 each 2   levothyroxine (SYNTHROID) 200 MCG tablet TAKE 1 TABLET BY MOUTH DAILY MONDAY-SATURDAY  TAKE 1/2 TABLET ON SUNDAY 90 tablet 2   metoprolol tartrate (LOPRESSOR) 50 MG tablet TAKE ONE AND ONE-HALF TABLET BY MOUTH TWICE A DAY 270 tablet 3   mupirocin ointment (BACTROBAN) 2 % Apply to great toe wound daily, with gauze and tape 30 g 0   MYRBETRIQ 25 MG TB24 tablet TAKE 1 TABLET DAILY (Patient taking differently: Take 25 mg by mouth daily.) 90 tablet 2   Semaglutide,0.25 or 0.5MG /DOS, (OZEMPIC, 0.25 OR 0.5 MG/DOSE,) 2 MG/1.5ML SOPN Inject 0.5 mg into the skin once a week. 4.5 mL 1   TRESIBA FLEXTOUCH 200 UNIT/ML FlexTouch Pen INJECT SUBCUTANEOUSLY 26   UNITS AT BEDTIME 18 mL 1   No current facility-administered medications for this visit.    Allergies:   Patient has no known allergies.    Social History:  The patient  reports that he quit smoking about 49 years ago. His smoking use included cigarettes. He has a 28.00 pack-year smoking history. He has never used smokeless tobacco. He reports that he does not drink alcohol and does not use drugs.   Family History:  The patient's family history  includes Diabetes in his brother, daughter, father, mother, sister, and another family member; Heart Problems in his daughter; Heart attack in his father and mother; Heart failure in his father; Hypertension in his brother, father, mother, and sister; Stroke in his brother.    ROS:  Please see the history of present illness.   Otherwise, review of systems are positive for none.   All other systems are reviewed and negative.    PHYSICAL EXAM: VS:  BP 120/60 (BP Location: Left Arm, Patient Position: Sitting, Cuff Size: Normal)    Pulse 71    Resp 20    Ht  5\' 11"  (1.803 m)    Wt 233 lb 6.4 oz (105.9 kg)    SpO2 95%    BMI 32.55 kg/m  , BMI Body mass index is 32.55 kg/m. GEN: Well nourished, well developed, in no acute distress  HEENT: normal  Neck: no JVD, carotid bruits, or masses Cardiac: Irregularly irregular; no  rubs, or gallops,no edema . There is a 3/6 mid-late peaking aortic stenosis murmur Respiratory:  clear to auscultation bilaterally, normal work of breathing GI: soft, nontender, nondistended, + BS MS: no deformity or atrophy  Skin: warm and dry, no rash Neuro:  Strength and sensation are intact Psych: euthymic mood, full affect Vascular: Femoral pulses are normal.    EKG:  EKG is ordered today. EKG showed atrial sensed ventricular paced rhythm.     Recent Labs: 08/02/2021: ALT 21; Hemoglobin 13.3; Platelets 137; TSH 3.540 09/28/2021: BUN 28; Creatinine, Ser 1.69; Potassium 4.2; Sodium 138    Lipid Panel    Component Value Date/Time   CHOL 178 08/02/2021 1038   TRIG 164 (H) 08/02/2021 1038   HDL 31 (L) 08/02/2021 1038   CHOLHDL 5.7 (H) 08/02/2021 1038   CHOLHDL 4 03/01/2021 1338   VLDL 28.4 03/01/2021 1338   LDLCALC 118 (H) 08/02/2021 1038   LDLCALC 142 (H) 05/31/2019 1246   LDLDIRECT 123.0 12/03/2019 1136      Wt Readings from Last 3 Encounters:  01/08/22 233 lb 6.4 oz (105.9 kg)  11/01/21 234 lb (106.1 kg)  10/03/21 233 lb (105.7 kg)        ASSESSMENT  AND PLAN:  1.  Peripheral arterial disease: Status post left BKA. Status post orbital atherectomy and drug-coated balloon angioplasty to the right SFA and popliteal arteries for critical limb ischemia with subsequent resolution of ulceration.  Currently with no claudication.   I requested a follow-up ABI and lower extremity duplex to be done in April.  2. Atrial fibrillation: He is currently on anticoagulation with Eliquis.  3. Hyperlipidemia: I reviewed most recent lipid profile done in September which showed an LDL of 118.  He is currently on atorvastatin 40 mg daily and Zetia 10 mg daily.  If LDL remains above 70 on repeat testing, should consider a PCSK9 inhibitor considering his extensive cardiovascular history.  4. Coronary artery disease involving native coronary arteries without angina or medical therapy.  5.  Status post TAVR: Seems to be stable.   Disposition:   FU with me in 6 months.  Signed,  [name], MD  01/08/2022 12:39 PM    [place]

## 2022-01-10 ENCOUNTER — Ambulatory Visit (INDEPENDENT_AMBULATORY_CARE_PROVIDER_SITE_OTHER): Payer: Medicare Other | Admitting: Endocrinology

## 2022-01-10 ENCOUNTER — Telehealth: Payer: Self-pay | Admitting: Physician Assistant

## 2022-01-10 ENCOUNTER — Other Ambulatory Visit: Payer: Self-pay

## 2022-01-10 ENCOUNTER — Encounter: Payer: Self-pay | Admitting: Endocrinology

## 2022-01-10 VITALS — BP 126/62 | HR 77 | Ht 71.0 in | Wt 233.6 lb

## 2022-01-10 DIAGNOSIS — Z794 Long term (current) use of insulin: Secondary | ICD-10-CM | POA: Diagnosis not present

## 2022-01-10 DIAGNOSIS — I2510 Atherosclerotic heart disease of native coronary artery without angina pectoris: Secondary | ICD-10-CM | POA: Diagnosis not present

## 2022-01-10 DIAGNOSIS — E063 Autoimmune thyroiditis: Secondary | ICD-10-CM

## 2022-01-10 DIAGNOSIS — E1165 Type 2 diabetes mellitus with hyperglycemia: Secondary | ICD-10-CM | POA: Diagnosis not present

## 2022-01-10 DIAGNOSIS — E782 Mixed hyperlipidemia: Secondary | ICD-10-CM | POA: Diagnosis not present

## 2022-01-10 LAB — POCT GLYCOSYLATED HEMOGLOBIN (HGB A1C): Hemoglobin A1C: 8.3 % — AB (ref 4.0–5.6)

## 2022-01-10 NOTE — Progress Notes (Signed)
Patient ID: Steven Maldonado, male   DOB: 1941-08-14, 81 y.o.   MRN: [id number]          Reason for Appointment: Endocrinology follow-up     History of Present Illness:     DIABETES:       Date of diagnosis of type 2 diabetes mellitus:  1999      Background history:   He had been treated with metformin and glipizide for several years Metformin was stopped about 4 years ago because of renal dysfunction Presumably because of poor control he was started on insulin around the year 2009 He had been mostly on Lantus which was subsequently changed to [employer] and he thinks that Lantus worked better Also has been on Humalog 3-4 times a day for some time  Recent history:    INSULIN regimen is:  TRESIBA 16 units at bedtime, Humalog 5 units ac bid  Non-insulin hypoglycemic drugs the patient is taking are: on Ozempic 0.5 mg weekly, glipizide ER 5 mg daily  Most recent A1c is 8.3 and about the same  Current management, blood sugar patterns and problems identified:  Overall blood sugars are higher in the last 2 weeks on his sensor download  Blood sugars are inconsistent at all times including overnight  He appears to be likely getting high readings after meals in the evening if he is bolusing late  Last night with eating a peanut butter sandwich and blood sugars appear to be around 200 but he likely took his insulin at least 1 hour later causing low sugars late at night  He is frequently not remembering to take his insulin before eating  Also mostly taking 8 units and not reducing it when he is eating smaller meals  Not clear if his Steven Maldonado dose has been consistent since on some nights his blood sugars are around 200 or more  Generally forgetting to check his blood sugar more than once or twice a day Mostly is eating 2 meals a day at about 9 AM and 4 PM Weight appears to be about the same overall since last visit He thinks he is taking his Ozempic regularly every Sunday without any nausea  recently  He does have some renal dysfunction    Side effects from medications have been: None     Meal times are:  Breakfast is at 9 AM, dinnertime 4-5 PM  Freestyle libre data:   Interpretation of the download of data:  Blood sugar monitoring is inconsistent and mostly has data missing in the afternoons and sometimes evenings giving only 56% active time Blood sugars are somewhat lower in the last week but most of his high readings appear to be either overnight and periodically late evening when the data is available In the last few days blood sugars overnight have been generally lower including readings in the upper 60s early this morning.  However overall blood sugars are high with some variability POSTPRANDIAL readings are difficult to assess because of inadequate data but has periodically excessively high readings after Usually not at other times Hypoglycemia has been minimal with only early this morning and late last night  CGM use % of time 56  2-week average/GV 164/24  Time in range   52     %  % Time Above 180 41+3  % Time above 250   % Time Below 70 4     PRE-MEAL Fasting Lunch Dinner Bedtime Overall  Glucose range:       Averages: 156  ?  164   POST-MEAL PC Breakfast PC Lunch PC Dinner  Glucose range:     Averages: 174 168 179    Dietician visit, most recent: 9/19  Weight history:  Wt Readings from Last 3 Encounters:  01/10/22 233 lb 9.6 oz (106 kg)  01/08/22 233 lb 6.4 oz (105.9 kg)  11/01/21 234 lb (106.1 kg)    Glycemic control:   Lab Results  Component Value Date   HGBA1C 8.3 (A) 01/10/2022   HGBA1C 8.4 (H) 09/28/2021   HGBA1C 7.2 (A) 07/02/2021   Lab Results  Component Value Date   MICROALBUR 10 05/25/2020   LDLCALC 118 (H) 08/02/2021   CREATININE 1.69 (H) 09/28/2021   Lab Results  Component Value Date   MICRALBCREAT 38 (H) 08/02/2021    Lab Results  Component Value Date   FRUCTOSAMINE 268 06/26/2018   HYPOTHYROIDISM and other  problems: See review of systems   Office Visit on 01/10/2022  Component Date Value Ref Range Status   Hemoglobin A1C 01/10/2022 8.3 (A)  4.0 - 5.6 % Final    Allergies as of 01/10/2022   No Known Allergies      Medication List        Accurate as of January 10, 2022 11:11 AM. If you have any questions, ask your nurse or doctor.          apixaban 5 MG Tabs tablet Commonly known as: ELIQUIS Take 1 tablet (5 mg total) by mouth 2 (two) times daily.   atorvastatin 40 MG tablet Commonly known as: LIPITOR Take 1 tablet (40 mg total) by mouth daily.   BD Pen Needle Nano 2nd Gen 32G X 4 MM Misc Generic drug: Insulin Pen Needle USE 4 (FOUR) TIMES DAILY.   docusate sodium 100 MG capsule Commonly known as: COLACE Take 300 mg by mouth daily.   ezetimibe 10 MG tablet Commonly known as: Zetia Take 1 tablet (10 mg total) by mouth daily.   Fenofibric Acid 135 MG Cpdr Take 1 tablet by mouth daily.   FreeStyle Lite Devi 1 each by Does not apply route 2 (two) times daily.   FREESTYLE LITE test strip Generic drug: glucose blood 1 EACH BY OTHER ROUTE 4 (FOUR) TIMES DAILY - BEFORE MEALS AND AT BEDTIME.   furosemide 20 MG tablet Commonly known as: LASIX Take 1 tablet (20 mg total) by mouth every other day.   glipiZIDE 5 MG 24 hr tablet Commonly known as: GLUCOTROL XL Take 1 tablet (5 mg total) by mouth daily with breakfast.   insulin lispro 100 UNIT/ML KwikPen Commonly known as: HumaLOG KwikPen Inject 5-8 Units into the skin See admin instructions. Inject 5-8 units under the skin up to three times daily if eating sweets or high carb foods.   levothyroxine 200 MCG tablet Commonly known as: SYNTHROID TAKE 1 TABLET BY MOUTH DAILY MONDAY-SATURDAY  TAKE 1/2 TABLET ON SUNDAY   metoprolol tartrate 50 MG tablet Commonly known as: LOPRESSOR TAKE ONE AND ONE-HALF TABLET BY MOUTH TWICE A DAY   mupirocin ointment 2 % Commonly known as: BACTROBAN Apply to great toe wound daily, with  gauze and tape   Myrbetriq 25 MG Tb24 tablet Generic drug: mirabegron ER TAKE 1 TABLET DAILY What changed: how much to take   Ozempic (0.25 or 0.5 MG/DOSE) 2 MG/1.5ML Sopn Generic drug: Semaglutide(0.25 or 0.5MG /DOS) Inject 0.5 mg into the skin once a week.   Steven Maldonado FlexTouch 200 UNIT/ML FlexTouch Pen Generic drug: insulin degludec INJECT SUBCUTANEOUSLY 26   UNITS  AT BEDTIME        Allergies: No Known Allergies  Past Medical History:  Diagnosis Date   Chronic diastolic CHF (congestive heart failure) (HCC)    CKD (chronic kidney disease), stage III (HCC)    Constipation    Coronary artery disease    a. Cath February 2012 in [place] [facility], occluded RCA with collaterals   DM type 2 (diabetes mellitus, type 2) ([place])    Essential hypertension    Hyperlipidemia    Neuropathy    feet   Pacemaker    a. symptomatic brady after TAVR s/p MDT PPM by Dr. Curt Bears 12/04/17   Persistent atrial fibrillation (HCC)    PONV (postoperative nausea and vomiting)    after valve surgery   PVD (peripheral vascular disease) ([place])    a. s/p R popliteal artery stenosis tx with drug-coated balloon 05/2014, followed by Dr. Fletcher Anon.   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 12/02/17: s/p TAVR   Skin cancer    Sleep apnea with use of continuous positive airway pressure (CPAP)    04-11-11 AHI was 32.9 and titrated to 15 cm H20, DME is A[facility]   Subclinical hypothyroidism     Past Surgical History:  Procedure Laterality Date   ABDOMINAL ANGIOGRAM N/A 06/08/2014   Procedure: ABDOMINAL ANGIOGRAM;  Surgeon: Wellington Hampshire, MD;  Location: [place] CATH LAB;  Service: Cardiovascular;  Laterality: N/A;   ABDOMINAL AORTOGRAM N/A 04/09/2018   Procedure: ABDOMINAL AORTOGRAM;  Surgeon: Conrad Clipper Mills, MD;  Location: [place] CV LAB;  Service: Cardiovascular;  Laterality:  N/A;   ABDOMINAL AORTOGRAM W/LOWER EXTREMITY N/A 08/23/2020   Procedure: ABDOMINAL AORTOGRAM W/LOWER EXTREMITY;  Surgeon: Wellington Hampshire, MD;  Location: [place] CV LAB;  Service: Cardiovascular;  Laterality: N/A;   AMPUTATION Left 04/17/2018   Procedure: LEFT FOOT 3RD RAY AMPUTATION;  Surgeon: Newt Minion, MD;  Location: [place];  Service: Orthopedics;  Laterality: Left;   AMPUTATION Left 05/28/2018   Procedure: LEFT AMPUTATION BELOW KNEE;  Surgeon: Wylene Simmer, MD;  Location: [place];  Service: Orthopedics;  Laterality: Left;   [place] N/A 11/08/2020   Procedure: ATRIAL FIBRILLATION ABLATION;  Surgeon: Constance Haw, MD;  Location: [place] CV LAB;  Service: Cardiovascular;  Laterality: N/A;   BELOW KNEE LEG AMPUTATION Left 05/28/2018   CARDIAC CATHETERIZATION  12/2010   CARDIOVERSION  07/2011   CARDIOVERSION N/A 04/18/2014   Procedure: CARDIOVERSION;  Surgeon: Dorothy Spark, MD;  Location: [place];  Service: Cardiovascular;  Laterality: N/A;   CARDIOVERSION N/A 11/03/2015   Procedure: CARDIOVERSION;  Surgeon: Lelon Perla, MD;  Location: [facility] ENDOSCOPY;  Service: Cardiovascular;  Laterality: N/A;   CARDIOVERSION N/A 05/08/2017   Procedure: CARDIOVERSION;  Surgeon: Dorothy Spark, MD;  Location: [facility] ENDOSCOPY;  Service: Cardiovascular;  Laterality: N/A;   CARDIOVERSION N/A 07/28/2017   Procedure: CARDIOVERSION;  Surgeon: Dorothy Spark, MD;  Location: [facility] ENDOSCOPY;  Service: Cardiovascular;  Laterality: N/A;   CARDIOVERSION N/A 02/08/2020   Procedure: CARDIOVERSION;  Surgeon: Buford Dresser, MD;  Location: [facility] ENDOSCOPY;  Service: Cardiovascular;  Laterality: N/A;   CARDIOVERSION N/A 03/15/2020   Procedure: CARDIOVERSION;  Surgeon: Acie Fredrickson Wonda Cheng, MD;  Location: [facility] ENDOSCOPY;  Service: Cardiovascular;  Laterality: N/A;   CARDIOVERSION N/A 09/20/2020   Procedure: CARDIOVERSION;  Surgeon: Werner Lean, MD;   Location: [place];  Service: Cardiovascular;  Laterality: N/A;   LOWER EXTREMITY ANGIOGRAM N/A 06/08/2014   Procedure: LOWER EXTREMITY ANGIOGRAM;  Surgeon: Wellington Hampshire, MD;  Location: [place] CATH LAB;  Service: Cardiovascular;  Laterality: N/A;   LOWER EXTREMITY ANGIOGRAPHY Left 04/09/2018   Procedure: Lower Extremity Angiography;  Surgeon: Conrad George, MD;  Location: [place] CV LAB;  Service: Cardiovascular;  Laterality: Left;   PACEMAKER IMPLANT N/A 12/04/2017   Procedure: PACEMAKER IMPLANT;  Surgeon: Constance Haw, MD;  Location: [place] CV LAB;  Service: Cardiovascular;  Laterality: N/A;   PERIPHERAL VASCULAR ATHERECTOMY  08/23/2020   Procedure: PERIPHERAL VASCULAR ATHERECTOMY;  Surgeon: Wellington Hampshire, MD;  Location: [place] CV LAB;  Service: Cardiovascular;;   PERIPHERAL VASCULAR BALLOON ANGIOPLASTY Left 04/09/2018   Procedure: PERIPHERAL VASCULAR BALLOON ANGIOPLASTY;  Surgeon: Conrad Santa Claus, MD;  Location: [place] CV LAB;  Service: Cardiovascular;  Laterality: Left;  SFA   POPLITEAL ARTERY ANGIOPLASTY Right 06/08/2014   Archie Endo 06/08/2014   RIGHT/LEFT HEART CATH AND CORONARY ANGIOGRAPHY N/A 10/08/2017   Procedure: RIGHT/LEFT HEART CATH AND CORONARY ANGIOGRAPHY;  Surgeon: Burnell Blanks, MD;  Location: [place] CV LAB;  Service: Cardiovascular;  Laterality: N/A;   SKIN CANCER EXCISION Bilateral    "have had them cut off back of neck X 2; off left upper arm; right wrist, near right shoulder blade" (06/08/2014)   TEE WITHOUT CARDIOVERSION N/A 12/02/2017   Procedure: TRANSESOPHAGEAL ECHOCARDIOGRAM (TEE);  Surgeon: Burnell Blanks, MD;  Location: [place];  Service: Open Heart Surgery;  Laterality: N/A;   TEMPORARY PACEMAKER N/A 12/04/2017   Procedure: TEMPORARY PACEMAKER;  Surgeon: Leonie Man, MD;  Location: [place] CV LAB;  Service: Cardiovascular;  Laterality: N/A;   TRANSCATHETER AORTIC VALVE REPLACEMENT, TRANSFEMORAL N/A 12/02/2017    Procedure: TRANSCATHETER AORTIC VALVE REPLACEMENT, TRANSFEMORAL;  Surgeon: Burnell Blanks, MD;  Location: [place];  Service: Open Heart Surgery;  Laterality: N/A;  using Edwards Sapien 3 Transcatheter Heart Valve size 68mm    Family History  Problem Relation Age of Onset   Diabetes Mother    Heart attack Mother    Hypertension Mother    Heart attack Father    Heart failure Father    Hypertension Father    Diabetes Father    Diabetes Sister    Diabetes Brother    Diabetes Other    Diabetes Daughter        TYPE ll   Heart Problems Daughter    Hypertension Sister    Hypertension Brother    Stroke Brother     Social History:  reports that he quit smoking about 49 years ago. His smoking use included cigarettes. He has a 28.00 pack-year smoking history. He has never used smokeless tobacco. He reports that he does not drink alcohol and does not use drugs.   Review of Systems   Lipid history: He is supposed to be on 40  mg atorvastatin and ezetimibe  Control is not consistent likely based on his refilling his medications when he is due  He has history of CAD    Lab Results  Component Value Date   CHOL 178 08/02/2021   CHOL 158 03/01/2021   CHOL 209 (H) 11/30/2020   Lab Results  Component Value Date   HDL 31 (L) 08/02/2021   HDL 35.80 (L) 03/01/2021   HDL 38.20 (L) 11/30/2020   Lab Results  Component Value Date  LDLCALC 118 (H) 08/02/2021   LDLCALC 94 03/01/2021   LDLCALC 145 (H) 11/30/2020   Lab Results  Component Value Date   TRIG 164 (H) 08/02/2021   TRIG 142.0 03/01/2021   TRIG 133.0 11/30/2020   Lab Results  Component Value Date   CHOLHDL 5.7 (H) 08/02/2021   CHOLHDL 4 03/01/2021   CHOLHDL 5 11/30/2020   Lab Results  Component Value Date   LDLDIRECT 123.0 12/03/2019   LDLDIRECT 77.0 08/18/2018            Hypertension: Has been controlled with only antihypertensive being metoprolol, not on ACE inhibitor  BP Readings from Last 3 Encounters:   01/10/22 126/62  01/08/22 120/60  11/01/21 (!) 108/52    Most recent eye exam was in 8/22  Most recent foot exam: 8/22   HYPOTHYROIDISM:   TSH is previously normal with taking 6-1/2 tablets a week of the 200 mcg levothyroxine He says he forgot to get his prescription refilled in January and may not have taken it for couple of months, last prescription was sent on 2/14, using mail order supply  Lab Results  Component Value Date   TSH 3.540 08/02/2021   TSH 4.41 03/01/2021   TSH 2.49 11/30/2020   FREET4 1.22 08/02/2021   FREET4 0.74 03/01/2021   FREET4 1.13 11/30/2020    Peripheral vascular disease, 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 History of symptomatic neuropathy  He has chronic kidney disease of unclear etiology, is followed by nephrologist but no records available, now needs to be followed annually   Lab Results  Component Value Date   CREATININE 1.69 (H) 09/28/2021   CREATININE 1.66 (H) 08/02/2021   CREATININE 1.66 (H) 03/01/2021       Physical Examination:  BP 126/62    Pulse 77    Ht 5\' 11"  (1.803 m)    Wt 233 lb 9.6 oz (106 kg)    SpO2 99%    BMI 32.58 kg/m          ASSESSMENT:  Diabetes type 2, insulin requiring with moderate obesity  See history of present illness for detailed discussion of current diabetes management, blood sugar patterns and problems identified  Currently on a regimen of basal bolus insulin with Ozempic 0.5 mg  A1c is 8.3 and about the same  Likely because of his cognitive difficulties he is not consistent with his insulin doses, monitoring and diet Likely is not taking [place] consistently because of variable overnight readings including low normal readings are sometimes over 200 Also frequently not taking his mealtime insulin until after eating when he remembers which may cause some mild hypoglycemia also Currently he has only 52% of readings within target but active CGM time is only 56% Also not  able to adjust his mealtime dose based on portions or carbohydrates    PLAN:   Continue 0.5 mg Ozempic weekly every Sunday  Reminded him to make sure he takes his Humalog when his food is ready and not wait till afterwards He will take 5 units for small meals and 8 units for usual meals Needs to try and check his sugar before every meal and 2 hours later as well as on waking up Discussed blood sugar targets  HYPERLIPIDEMIA: Has had variable results with his treatment based on his compliance will recheck labs on the next visit    Hypothyroidism: We will have labs checked on his next visit since he only resumed his regimen about 2 weeks ago  He says he may be better compliant now since he is getting his medications mail order company   There are no Patient Instructions on file for this visit.   Total visit time including counseling 30 Minutes  Steven Maldonado 01/10/2022, 11:11 AM   Note: This office note was prepared with Dragon voice recognition system technology. Any transcriptional errors that result from this process are unintentional.

## 2022-01-10 NOTE — Patient Instructions (Addendum)
Take Humalog 5-15 min before a meal or at most just after eating ? ?Tresiba 14 units daily ? ?Take thyoid pills as directed before breakfast ? ?Check blood sugars on waking up daily and before each meal ? ?Also check blood sugars about 2 hours after meals and do this after different meals by rotation ? ?Recommended blood sugar levels on waking up are 90-130 and about 2 hours after meal is 130-160 ? ?Please bring your blood sugar monitor to each visit, thank you ? ?

## 2022-01-10 NOTE — Telephone Encounter (Signed)
Patient requesting refill of Tresiba sent to [facility]. Please advise.  ?

## 2022-01-11 ENCOUNTER — Encounter: Payer: Self-pay | Admitting: Endocrinology

## 2022-01-11 MED ORDER — TRESIBA FLEXTOUCH 200 UNIT/ML ~~LOC~~ SOPN: PEN_INJECTOR | SUBCUTANEOUS | 1 refills | Status: DC

## 2022-01-11 NOTE — Telephone Encounter (Signed)
Med refill sent to pharmacy  

## 2022-01-14 ENCOUNTER — Other Ambulatory Visit: Payer: Self-pay | Admitting: *Deleted

## 2022-01-14 DIAGNOSIS — I4819 Other persistent atrial fibrillation: Secondary | ICD-10-CM

## 2022-01-14 MED ORDER — APIXABAN 5 MG PO TABS: 5.0000 mg | ORAL_TABLET | Freq: Two times a day (BID) | ORAL | 2 refills | Status: AC

## 2022-01-14 NOTE — Telephone Encounter (Signed)
Eliquis '5mg'$  paper refill request received. Patient is 81 years old, weight-106kg, Crea-1.69 on 09/28/2021, Diagnosis-Afib, and last seen by Dr. Fletcher Anon on 01/08/2022 and Ermalinda Barrios on 08/29/2021. Dose is appropriate based on dosing criteria. Will send in refill to requested pharmacy.   ?

## 2022-01-30 ENCOUNTER — Other Ambulatory Visit: Payer: Self-pay | Admitting: Endocrinology

## 2022-01-30 ENCOUNTER — Encounter (INDEPENDENT_AMBULATORY_CARE_PROVIDER_SITE_OTHER): Payer: Medicare Other | Admitting: Ophthalmology

## 2022-01-30 ENCOUNTER — Other Ambulatory Visit: Payer: Self-pay

## 2022-01-30 DIAGNOSIS — I10 Essential (primary) hypertension: Secondary | ICD-10-CM

## 2022-01-30 DIAGNOSIS — H43813 Vitreous degeneration, bilateral: Secondary | ICD-10-CM | POA: Diagnosis not present

## 2022-01-30 DIAGNOSIS — E113292 Type 2 diabetes mellitus with mild nonproliferative diabetic retinopathy without macular edema, left eye: Secondary | ICD-10-CM | POA: Diagnosis not present

## 2022-01-30 DIAGNOSIS — H35033 Hypertensive retinopathy, bilateral: Secondary | ICD-10-CM | POA: Diagnosis not present

## 2022-01-30 DIAGNOSIS — E1165 Type 2 diabetes mellitus with hyperglycemia: Secondary | ICD-10-CM

## 2022-01-30 DIAGNOSIS — H353112 Nonexudative age-related macular degeneration, right eye, intermediate dry stage: Secondary | ICD-10-CM

## 2022-01-30 DIAGNOSIS — E113391 Type 2 diabetes mellitus with moderate nonproliferative diabetic retinopathy without macular edema, right eye: Secondary | ICD-10-CM

## 2022-02-11 ENCOUNTER — Ambulatory Visit (HOSPITAL_COMMUNITY)
Admission: RE | Admit: 2022-02-11 | Payer: Medicare Other | Source: Ambulatory Visit | Attending: Cardiovascular Disease | Admitting: Cardiovascular Disease

## 2022-02-21 ENCOUNTER — Ambulatory Visit (INDEPENDENT_AMBULATORY_CARE_PROVIDER_SITE_OTHER): Payer: Medicare Other | Admitting: Endocrinology

## 2022-02-21 VITALS — BP 136/78 | HR 87 | Ht 71.0 in | Wt 239.8 lb

## 2022-02-21 DIAGNOSIS — E1165 Type 2 diabetes mellitus with hyperglycemia: Secondary | ICD-10-CM | POA: Diagnosis not present

## 2022-02-21 DIAGNOSIS — E039 Hypothyroidism, unspecified: Secondary | ICD-10-CM

## 2022-02-21 DIAGNOSIS — I2510 Atherosclerotic heart disease of native coronary artery without angina pectoris: Secondary | ICD-10-CM | POA: Diagnosis not present

## 2022-02-21 DIAGNOSIS — Z794 Long term (current) use of insulin: Secondary | ICD-10-CM

## 2022-02-21 DIAGNOSIS — E1122 Type 2 diabetes mellitus with diabetic chronic kidney disease: Secondary | ICD-10-CM | POA: Diagnosis not present

## 2022-02-21 DIAGNOSIS — E782 Mixed hyperlipidemia: Secondary | ICD-10-CM | POA: Diagnosis not present

## 2022-02-21 DIAGNOSIS — Z20822 Contact with and (suspected) exposure to covid-19: Secondary | ICD-10-CM | POA: Diagnosis not present

## 2022-02-21 LAB — COMPREHENSIVE METABOLIC PANEL
ALT: 15 U/L (ref 0–53)
AST: 19 U/L (ref 0–37)
Albumin: 4.2 g/dL (ref 3.5–5.2)
Alkaline Phosphatase: 23 U/L — ABNORMAL LOW (ref 39–117)
BUN: 32 mg/dL — ABNORMAL HIGH (ref 6–23)
CO2: 29 mEq/L (ref 19–32)
Calcium: 10 mg/dL (ref 8.4–10.5)
Chloride: 102 mEq/L (ref 96–112)
Creatinine, Ser: 1.7 mg/dL — ABNORMAL HIGH (ref 0.40–1.50)
GFR: 38.17 mL/min — ABNORMAL LOW (ref >60.00)
Glucose, Bld: 134 mg/dL — ABNORMAL HIGH (ref 70–99)
Potassium: 4.1 mEq/L (ref 3.5–5.1)
Sodium: 138 mEq/L (ref 135–145)
Total Bilirubin: 0.6 mg/dL (ref 0.2–1.2)
Total Protein: 6.5 g/dL (ref 6.0–8.3)

## 2022-02-21 LAB — POCT GLUCOSE (DEVICE FOR HOME USE): Glucose Fasting, POC: 146 mg/dL — AB (ref 70–99)

## 2022-02-21 LAB — LIPID PANEL
Cholesterol: 120 mg/dL (ref 0–200)
HDL: 29.5 mg/dL — ABNORMAL LOW (ref >39.00)
LDL Cholesterol: 69 mg/dL (ref 0–99)
NonHDL: 90.37
Total CHOL/HDL Ratio: 4
Triglycerides: 105 mg/dL (ref 0.0–149.0)
VLDL: 21 mg/dL (ref 0.0–40.0)

## 2022-02-21 LAB — TSH: TSH: 0.51 u[IU]/mL (ref 0.35–5.50)

## 2022-02-21 LAB — T4, FREE: Free T4: 1.42 ng/dL (ref 0.60–1.60)

## 2022-02-21 NOTE — Patient Instructions (Addendum)
Replace sensor as it is '60mg'$  lower than actual meter sugar ? ?Take 5-8 Humalog BEFORE every meal unless eating no carbs ? ?Tresiba/ blue 16 units once daily ? ?Check sugar 4x daily ?

## 2022-02-21 NOTE — Progress Notes (Signed)
Patient ID: Steven Maldonado, male   DOB: 1941-05-09, 81 y.o.   MRN: [id number] ?       ? ? ?Reason for Appointment: Endocrinology follow-up  ? ? ? ?History of Present Illness:  ?   ?DIABETES: ?      ?Date of diagnosis of type 2 diabetes mellitus:  1999     ? ?Background history:  ? ?He had been treated with metformin and glipizide for several years ?Metformin was stopped about 4 years ago because of renal dysfunction ?Presumably because of poor control he was started on insulin around the year 2009 ?He had been mostly on Lantus which was subsequently changed to [employer] and he thinks that Lantus worked better ?Also has been on Humalog 3-4 times a day for some time ? ?Recent history:  ? ? ?INSULIN regimen is:  TRESIBA 16 units at bedtime, Humalog ?  10 units ac bid ? ?Non-insulin hypoglycemic drugs the patient is taking are: on Ozempic 0.5 mg weekly, glipizide ER 5 mg daily ? ?Most recent A1c is 8.3 ?No recent labs available ? ?Current management, blood sugar patterns and problems identified: ? ?He is still not checking his blood sugars as directed and mostly once a day in the morning and only occasionally at night  ?With this difficult to identify any blood sugar patterns  ?Also today his blood sugar was about 60 mg lower than fingerstick blood sugar  ?Not clear how since when his blood sugars have been falsely low since he has mostly high readings overnight the first week of April and overnight blood sugars have been normal to low the last 4 nights ?He still appears to have some periodic significant hyperglycemia late in the evening or overnight  ?Most likely is not taking his Humalog consistently may only take it sporadically when he remembers  ?He thinks he is taking [place] most of the time but likely is not taking this daily also  ?He thinks his appetite is fairly good ?He appears to have gained some weight ?Mostly is eating 2 meals a day at about 9 AM and 4 PM ?He thinks he is taking his Ozempic regularly every  Sunday  ? ? ? ?Side effects from medications have been: None ?    ?Meal times are:  Breakfast is at 9 AM, dinnertime 4-5 PM ? ?Freestyle libre data:  ? ? ?CGM use % of time 39  ?2-week average/GV 158/35  ?Time in range 57  ?% Time Above 180 29+6  ?% Time above 250   ?% Time Below 70 8  ? ?  ?PRE-MEAL Fasting Lunch Dinner Bedtime Overall  ?Glucose range:       ?Averages: 146 148  221 158  ? ?POST-MEAL PC Breakfast PC Lunch PC Dinner  ?Glucose range:     ?Averages: 166 185 207  ? ?Previous data: ? ?Interpretation of the download of data: ? ?Blood sugar monitoring is inconsistent and mostly has data missing in the afternoons and sometimes evenings giving only 56% active time ?Blood sugars are somewhat lower in the last week but most of his high readings appear to be either overnight and periodically late evening when the data is available ?In the last few days blood sugars overnight have been generally lower including readings in the upper 60s early this morning.  However overall blood sugars are high with some variability ?POSTPRANDIAL readings are difficult to assess because of inadequate data but has periodically excessively high readings after ?Usually not at other times ?  Hypoglycemia has been minimal with only early this morning and late last night ? ?CGM use % of time 56  ?2-week average/GV 164/24  ?Time in range   52     %  ?% Time Above 180 41+3  ?% Time above 250   ?% Time Below 70 4  ? ?  ?PRE-MEAL Fasting Lunch Dinner Bedtime Overall  ?Glucose range:       ?Averages: 156  ?  164  ? ?POST-MEAL PC Breakfast PC Lunch PC Dinner  ?Glucose range:     ?Averages: 174 168 179  ? ? ?Dietician visit, most recent: 9/19 ? ?Weight history: ? ?Wt Readings from Last 3 Encounters:  ?02/21/22 239 lb 12.8 oz (108.8 kg)  ?01/10/22 233 lb 9.6 oz (106 kg)  ?01/08/22 233 lb 6.4 oz (105.9 kg)  ? ? ?Glycemic control: ?  ?Lab Results  ?Component Value Date  ? HGBA1C 8.3 (A) 01/10/2022  ? HGBA1C 8.4 (H) 09/28/2021  ? HGBA1C 7.2 (A)  07/02/2021  ? ?Lab Results  ?Component Value Date  ? MICROALBUR 10 05/25/2020  ? [place] 69 02/21/2022  ? CREATININE 1.70 (H) 02/21/2022  ? ?Lab Results  ?Component Value Date  ? MICRALBCREAT 38 (H) 08/02/2021  ? ? ?Lab Results  ?Component Value Date  ? FRUCTOSAMINE 268 06/26/2018  ? ?HYPOTHYROIDISM and other problems: See review of systems ? ? ?Office Visit on 02/21/2022  ?Component Date Value Ref Range Status  ? Glucose Fasting, POC 02/21/2022 146 (A)  70 - 99 mg/dL Final  ? Free T4 02/21/2022 1.42  0.60 - 1.60 ng/dL Final  ? Comment: Specimens from patients who are undergoing biotin therapy and /or ingesting biotin supplements may contain high levels of biotin.  The higher biotin concentration in these specimens interferes with this Free T4 assay.  Specimens that contain high levels  ?of biotin may cause false high results for this Free T4 assay.  Please interpret results in light of the total clinical presentation of the patient.  ?  ? TSH 02/21/2022 0.51  0.35 - 5.50 uIU/mL Final  ? Cholesterol 02/21/2022 120  0 - 200 mg/dL Final  ? ATP III Classification       Desirable:  < 200 mg/dL               Borderline High:  200 - 239 mg/dL          High:  > = 240 mg/dL  ? Triglycerides 02/21/2022 105.0  0.0 - 149.0 mg/dL Final  ? Normal:  <150 mg/dLBorderline High:  150 - 199 mg/dL  ? HDL 02/21/2022 29.50 (L)  >39.00 mg/dL Final  ? VLDL 02/21/2022 21.0  0.0 - 40.0 mg/dL Final  ? LDL Cholesterol 02/21/2022 69  0 - 99 mg/dL Final  ? Total CHOL/HDL Ratio 02/21/2022 4   Final  ?                Men          Women1/2 Average Risk     3.4          3.3Average Risk          5.0          4.42X Average Risk          9.6          7.13X Average Risk          15.0          11.0                      ?  NonHDL 02/21/2022 90.37   Final  ? NOTE:  Non-HDL goal should be 30 mg/dL higher than patient's LDL goal (i.e. LDL goal of < 70 mg/dL, would have non-HDL goal of < 100 mg/dL)  ? Sodium 02/21/2022 138  135 - 145 mEq/L Final  ? Potassium  02/21/2022 4.1  3.5 - 5.1 mEq/L Final  ? Chloride 02/21/2022 102  96 - 112 mEq/L Final  ? CO2 02/21/2022 29  19 - 32 mEq/L Final  ? Glucose, Bld 02/21/2022 134 (H)  70 - 99 mg/dL Final  ? BUN 02/21/2022 32 (H)  6 - 23 mg/dL Final  ? Creatinine, Ser 02/21/2022 1.70 (H)  0.40 - 1.50 mg/dL Final  ? Total Bilirubin 02/21/2022 0.6  0.2 - 1.2 mg/dL Final  ? Alkaline Phosphatase 02/21/2022 23 (L)  39 - 117 U/L Final  ? AST 02/21/2022 19  0 - 37 U/L Final  ? ALT 02/21/2022 15  0 - 53 U/L Final  ? Total Protein 02/21/2022 6.5  6.0 - 8.3 g/dL Final  ? Albumin 02/21/2022 4.2  3.5 - 5.2 g/dL Final  ? GFR 02/21/2022 38.17 (L)  >60.00 mL/min Final  ? Calculated using the CKD-EPI Creatinine Equation (2021)  ? Calcium 02/21/2022 10.0  8.4 - 10.5 mg/dL Final  ? ? ?Allergies as of 02/21/2022   ?No Known Allergies ?  ? ?  ?Medication List  ?  ? ?  ? Accurate as of February 21, 2022  9:05 PM. If you have any questions, ask your nurse or doctor.  ?  ?  ? ?  ? ?apixaban 5 MG Tabs tablet ?Commonly known as: ELIQUIS ?Take 1 tablet (5 mg total) by mouth 2 (two) times daily. ?  ?atorvastatin 40 MG tablet ?Commonly known as: LIPITOR ?Take 1 tablet (40 mg total) by mouth daily. ?  ?BD Pen Needle Nano 2nd Gen 32G X 4 MM Misc ?Generic drug: Insulin Pen Needle ?USE 4 (FOUR) TIMES DAILY. ?  ?docusate sodium 100 MG capsule ?Commonly known as: COLACE ?Take 300 mg by mouth daily. ?  ?ezetimibe 10 MG tablet ?Commonly known as: Zetia ?Take 1 tablet (10 mg total) by mouth daily. ?  ?Fenofibric Acid 135 MG Cpdr ?Take 1 tablet by mouth daily. ?  ?FreeStyle Lite Devi ?1 each by Does not apply route 2 (two) times daily. ?  ?FREESTYLE LITE test strip ?Generic drug: glucose blood ?1 EACH BY OTHER ROUTE 4 (FOUR) TIMES DAILY - BEFORE MEALS AND AT BEDTIME. ?  ?furosemide 20 MG tablet ?Commonly known as: LASIX ?Take 1 tablet (20 mg total) by mouth every other day. ?  ?glipiZIDE 5 MG 24 hr tablet ?Commonly known as: GLUCOTROL XL ?Take 1 tablet (5 mg total) by mouth  daily with breakfast. ?  ?insulin lispro 100 UNIT/ML KwikPen ?Commonly known as: HumaLOG KwikPen ?Inject 5-8 Units into the skin See admin instructions. Inject 5-8 units under the skin up to three times d

## 2022-03-04 ENCOUNTER — Other Ambulatory Visit: Payer: Self-pay | Admitting: *Deleted

## 2022-03-04 NOTE — Patient Outreach (Signed)
[facility]) Care Management ? ?03/04/2022 ? ?Steven Maldonado ?15-Feb-1941 ?[id number] ? ?RN Health Coach attempted follow up outreach call to patient.  Patient was unavailable. HIPPA compliance voicemail message left with return callback number. ? ?Plan: ?RN will call patient again within 30 days. ? ?Johny Shock BSN RN ?[place] Management ?[phone] ? ? ?

## 2022-03-05 ENCOUNTER — Other Ambulatory Visit: Payer: Self-pay

## 2022-03-05 DIAGNOSIS — E1165 Type 2 diabetes mellitus with hyperglycemia: Secondary | ICD-10-CM

## 2022-03-05 MED ORDER — FREESTYLE LITE TEST VI STRP: ORAL_STRIP | 3 refills | Status: AC

## 2022-03-05 MED ORDER — FREESTYLE LANCETS MISC: 3 refills | Status: AC

## 2022-03-05 MED ORDER — FREESTYLE LITE W/DEVICE KIT: PACK | 0 refills | Status: AC

## 2022-03-07 ENCOUNTER — Ambulatory Visit (HOSPITAL_COMMUNITY)
Admission: RE | Admit: 2022-03-07 | Discharge: 2022-03-07 | Disposition: A | Discharge status: Home / Self Care | Payer: Medicare Other | Source: Ambulatory Visit | Attending: Cardiology | Admitting: Cardiology

## 2022-03-07 ENCOUNTER — Ambulatory Visit (HOSPITAL_COMMUNITY)
Admission: RE | Admit: 2022-03-07 | Payer: Medicare Other | Source: Ambulatory Visit | Attending: Cardiovascular Disease | Admitting: Cardiovascular Disease

## 2022-03-07 DIAGNOSIS — I739 Peripheral vascular disease, unspecified: Secondary | ICD-10-CM | POA: Diagnosis not present

## 2022-03-11 ENCOUNTER — Encounter: Payer: Self-pay | Admitting: *Deleted

## 2022-03-14 DIAGNOSIS — Z20822 Contact with and (suspected) exposure to covid-19: Secondary | ICD-10-CM | POA: Diagnosis not present

## 2022-03-15 ENCOUNTER — Ambulatory Visit (INDEPENDENT_AMBULATORY_CARE_PROVIDER_SITE_OTHER): Payer: Medicare Other | Admitting: Cardiovascular Disease

## 2022-03-15 ENCOUNTER — Encounter: Payer: Self-pay | Admitting: Cardiovascular Disease

## 2022-03-15 VITALS — BP 100/62 | HR 86 | Ht 71.0 in | Wt 233.0 lb

## 2022-03-15 DIAGNOSIS — I442 Atrioventricular block, complete: Secondary | ICD-10-CM

## 2022-03-15 DIAGNOSIS — Z952 Presence of prosthetic heart valve: Secondary | ICD-10-CM

## 2022-03-15 DIAGNOSIS — I2510 Atherosclerotic heart disease of native coronary artery without angina pectoris: Secondary | ICD-10-CM

## 2022-03-15 DIAGNOSIS — I739 Peripheral vascular disease, unspecified: Secondary | ICD-10-CM

## 2022-03-15 DIAGNOSIS — I480 Paroxysmal atrial fibrillation: Secondary | ICD-10-CM

## 2022-03-15 NOTE — Patient Instructions (Signed)
Medication Instructions:  No changes *If you need a refill on your cardiac medications before your next appointment, please call your pharmacy*   Lab Work: none If you have labs (blood work) drawn today and your tests are completely normal, you will receive your results only by: MyChart Message (if you have MyChart) OR A paper copy in the mail If you have any lab test that is abnormal or we need to change your treatment, we will call you to review the results.   Testing/Procedures: none   Follow-Up: At [facility], you and your health needs are our priority.  As part of our continuing mission to provide you with exceptional heart care, we have created designated Provider Care Teams.  These Care Teams include your primary Cardiologist (physician) and Advanced Practice Providers (APPs -  Physician Assistants and Nurse Practitioners) who all work together to provide you with the care you need, when you need it.  We recommend signing up for the patient portal called "MyChart".  Sign up information is provided on this After Visit Summary.  MyChart is used to connect with patients for Virtual Visits (Telemedicine).  Patients are able to view lab/test results, encounter notes, upcoming appointments, etc.  Non-urgent messages can be sent to your provider as well.   To learn more about what you can do with MyChart, go to [email or web address].    Your next appointment:   12 month(s)  The format for your next appointment:   In Person  Provider:   Christopher McAlhany, MD     Other Instructions   Important Information About Sugar       

## 2022-03-15 NOTE — Progress Notes (Signed)
? ?Chief Complaint  ?Patient presents with  ? 2 Week Follow-up  ?  CAD  ? ?History of Present Illness: 81 yo male with history of severe aortic stenosis s/p TAVR, paroxysmal atrial fibrillation s/p ablation, uncontrolled DM, HTN, hyperlipidemia, sleep apnea, PAD, complete heart block s/p PPM and CAD here for cardiac follow up. He had an acute MI in 2012 and was taken to [facility] where cath showed an occluded RCA which filled from collaterals and was managed medically. He had recurrence of atrial fib in 2016. He was cardioverted to sinus. He was in atrial fibrillation when seen by Dr. Fletcher Anon in [facility] May 2018. Lopressor was increased. He has been followed by EP since then and had atrial fib ablation in 2021. He has been followed for severe AS and underwent TAVR on 12/02/17 with placement of a 29 mm Edwards Sapien 3 valve from the right transfemoral approach. Post op echo with normal LV function with normally functioning AVR and no AI. Echo January 2020 with normal LVEF and well functioning AVR. He had complete heart block post TAVR and had a permanent pacemaker implanted. Cardiac cath November 2018 with 60% proximal LAD stenosis and known occluded distal RCA. His PAD had been followed by Dr. Fletcher Anon. 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 He was admitted to [facility] May 2019 with a gangrenous left third toe. He underwent angiography per DR. Chen on 04/09/18 with stenting of the left SFA but he could not open the left popliteal artery.  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 Sharol Given. 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 In October of 2021 he had orbital atherectomy and drug coated balloon angioplasty of the right distal SFA.  ? ?He is here today for follow up. The patient denies any chest pain, dyspnea, palpitations, lower extremity edema, orthopnea, PND, dizziness, near syncope or syncope.  ? ?Primary Care Physician: Lorrene Reid, [name] ? ? ?Past Medical History:  ?Diagnosis Date  ? Chronic diastolic CHF (congestive heart failure) ([place])   ? CKD (chronic kidney disease), stage III ([place])   ? Constipation   ? Coronary artery disease   ? a. Cath February 2012 in [place], occluded RCA with collaterals  ? DM type 2 (diabetes mellitus, type 2) ([place])   ? Essential hypertension   ? Hyperlipidemia   ? Neuropathy   ? feet  ? Pacemaker   ? a. symptomatic brady after TAVR s/p MDT PPM by Dr. Curt Bears 12/04/17  ? Persistent atrial fibrillation ([place])   ? PONV (postoperative nausea and vomiting)   ? after valve surgery  ? PVD (peripheral vascular disease) ([place])   ? a. s/p R popliteal artery stenosis tx with drug-coated balloon 05/2014, followed by Dr. Fletcher Anon.  ? 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 29 mm Edwards Sapien 3 transcatheter heart valve placed via percutaneous right transfemoral approach   ? Severe aortic stenosis   ? a. 12/02/17: s/p TAVR  ? Skin cancer   ? Sleep apnea with use of continuous positive airway pressure (CPAP)   ? 04-11-11 AHI was 32.9 and titrated to 15 cm H20, DME is [facility]  ? Subclinical hypothyroidism   ? ? ?Past Surgical History:  ?Procedure Laterality Date  ? ABDOMINAL ANGIOGRAM N/A 06/08/2014  ? Procedure: ABDOMINAL ANGIOGRAM;  Surgeon: Wellington Hampshire, MD;  Location: [facility] CATH LAB;  Service: Cardiovascular;  Laterality: N/A;  ?  ABDOMINAL AORTOGRAM N/A 04/09/2018  ? Procedure: ABDOMINAL AORTOGRAM;  Surgeon: Conrad Waldron, MD;  Location: [place] CV LAB;  Service: Cardiovascular;  Laterality: N/A;  ? ABDOMINAL AORTOGRAM W/LOWER EXTREMITY N/A 08/23/2020  ? Procedure: ABDOMINAL AORTOGRAM W/LOWER EXTREMITY;  Surgeon: Wellington Hampshire, MD;  Location: [place] CV LAB;  Service: Cardiovascular;  Laterality: N/A;  ? AMPUTATION Left 04/17/2018  ? Procedure: LEFT FOOT 3RD RAY AMPUTATION;  Surgeon: Newt Minion, MD;  Location: [place];  Service: Orthopedics;  Laterality: Left;  ? AMPUTATION Left 05/28/2018  ?  Procedure: LEFT AMPUTATION BELOW KNEE;  Surgeon: Wylene Simmer, MD;  Location: [place];  Service: Orthopedics;  Laterality: Left;  ? APPENDECTOMY  1965  ? ATRIAL FIBRILLATION ABLATION N/A 11/08/2020  ? Procedure: ATRIAL FIBRILLATION ABLATION;  Surgeon: Constance Haw, MD;  Location: [place] CV LAB;  Service: Cardiovascular;  Laterality: N/A;  ? BELOW KNEE LEG AMPUTATION Left 05/28/2018  ? CARDIAC CATHETERIZATION  12/2010  ? CARDIOVERSION  07/2011  ? CARDIOVERSION N/A 04/18/2014  ? Procedure: CARDIOVERSION;  Surgeon: Dorothy Spark, MD;  Location: [place];  Service: Cardiovascular;  Laterality: N/A;  ? CARDIOVERSION N/A 11/03/2015  ? Procedure: CARDIOVERSION;  Surgeon: Lelon Perla, MD;  Location: [place];  Service: Cardiovascular;  Laterality: N/A;  ? CARDIOVERSION N/A 05/08/2017  ? Procedure: CARDIOVERSION;  Surgeon: Dorothy Spark, MD;  Location: [place];  Service: Cardiovascular;  Laterality: N/A;  ? CARDIOVERSION N/A 07/28/2017  ? Procedure: CARDIOVERSION;  Surgeon: Dorothy Spark, MD;  Location: [place];  Service: Cardiovascular;  Laterality: N/A;  ? CARDIOVERSION N/A 02/08/2020  ? Procedure: CARDIOVERSION;  Surgeon: Buford Dresser, MD;  Location: [place];  Service: Cardiovascular;  Laterality: N/A;  ? CARDIOVERSION N/A 03/15/2020  ? Procedure: CARDIOVERSION;  Surgeon: Thayer Headings, MD;  Location: [place];  Service: Cardiovascular;  Laterality: N/A;  ? CARDIOVERSION N/A 09/20/2020  ? Procedure: CARDIOVERSION;  Surgeon: Werner Lean, MD;  Location: [place];  Service: Cardiovascular;  Laterality: N/A;  ? LOWER EXTREMITY ANGIOGRAM N/A 06/08/2014  ? Procedure: LOWER EXTREMITY ANGIOGRAM;  Surgeon: Wellington Hampshire, MD;  Location: [place] CATH LAB;  Service: Cardiovascular;  Laterality: N/A;  ? LOWER EXTREMITY ANGIOGRAPHY Left 04/09/2018  ? Procedure: Lower Extremity Angiography;  Surgeon: Conrad Saltillo, MD;  Location: [place] CV LAB;  Service:  Cardiovascular;  Laterality: Left;  ? PACEMAKER IMPLANT N/A 12/04/2017  ? Procedure: PACEMAKER IMPLANT;  Surgeon: Constance Haw, MD;  Location: [place] CV LAB;  Service: Cardiovascular;  Laterality: N/A;  ? PERIPHERAL VASCULAR ATHERECTOMY  08/23/2020  ? Procedure: PERIPHERAL VASCULAR ATHERECTOMY;  Surgeon: Wellington Hampshire, MD;  Location: [place] CV LAB;  Service: Cardiovascular;;  ? PERIPHERAL VASCULAR BALLOON ANGIOPLASTY Left 04/09/2018  ? Procedure: PERIPHERAL VASCULAR BALLOON ANGIOPLASTY;  Surgeon: Conrad Amberley, MD;  Location: [place] CV LAB;  Service: Cardiovascular;  Laterality: Left;  SFA  ? POPLITEAL ARTERY ANGIOPLASTY Right 06/08/2014  ? Archie Endo 06/08/2014  ? RIGHT/LEFT HEART CATH AND CORONARY ANGIOGRAPHY N/A 10/08/2017  ? Procedure: RIGHT/LEFT HEART CATH AND CORONARY ANGIOGRAPHY;  Surgeon: Burnell Blanks, MD;  Location: [place] CV LAB;  Service: Cardiovascular;  Laterality: N/A;  ? SKIN CANCER EXCISION Bilateral   ? "have had them cut off back of neck X 2; off left upper arm; right wrist, near right shoulder blade" (06/08/2014)  ? TEE WITHOUT CARDIOVERSION N/A 12/02/2017  ? Procedure: TRANSESOPHAGEAL ECHOCARDIOGRAM (TEE);  Surgeon: Burnell Blanks, MD;  Location: [facility]  OR;  Service: Open Heart Surgery;  Laterality: N/A;  ? TEMPORARY PACEMAKER N/A 12/04/2017  ? Procedure: TEMPORARY PACEMAKER;  Surgeon: Leonie Man, MD;  Location: [place] CV LAB;  Service: Cardiovascular;  Laterality: N/A;  ? TRANSCATHETER AORTIC VALVE REPLACEMENT, TRANSFEMORAL N/A 12/02/2017  ? Procedure: TRANSCATHETER AORTIC VALVE REPLACEMENT, TRANSFEMORAL;  Surgeon: Burnell Blanks, MD;  Location: [place];  Service: Open Heart Surgery;  Laterality: N/A;  using Edwards Sapien 3 Transcatheter Heart Valve size 8m  ? ? ?Current Outpatient Medications  ?Medication Sig Dispense Refill  ? apixaban (ELIQUIS) 5 MG TABS tablet Take 1 tablet (5 mg total) by mouth 2 (two) times daily. 180 tablet 2  ?  atorvastatin (LIPITOR) 40 MG tablet Take 1 tablet (40 mg total) by mouth daily. 90 tablet 3  ? Blood Glucose Monitoring Suppl (FREESTYLE LITE) DEVI 1 each by Does not apply route 2 (two) times daily. 1 each 0  ? [place]

## 2022-03-17 DIAGNOSIS — Z20822 Contact with and (suspected) exposure to covid-19: Secondary | ICD-10-CM | POA: Diagnosis not present

## 2022-03-18 DIAGNOSIS — H6123 Impacted cerumen, bilateral: Secondary | ICD-10-CM | POA: Diagnosis not present

## 2022-03-20 ENCOUNTER — Encounter (INDEPENDENT_AMBULATORY_CARE_PROVIDER_SITE_OTHER): Payer: Medicare Other | Admitting: Ophthalmology

## 2022-03-20 DIAGNOSIS — E113293 Type 2 diabetes mellitus with mild nonproliferative diabetic retinopathy without macular edema, bilateral: Secondary | ICD-10-CM

## 2022-03-20 DIAGNOSIS — H35033 Hypertensive retinopathy, bilateral: Secondary | ICD-10-CM

## 2022-03-20 DIAGNOSIS — I10 Essential (primary) hypertension: Secondary | ICD-10-CM

## 2022-03-20 DIAGNOSIS — H43813 Vitreous degeneration, bilateral: Secondary | ICD-10-CM

## 2022-03-20 DIAGNOSIS — H353112 Nonexudative age-related macular degeneration, right eye, intermediate dry stage: Secondary | ICD-10-CM

## 2022-03-21 DIAGNOSIS — Z20822 Contact with and (suspected) exposure to covid-19: Secondary | ICD-10-CM | POA: Diagnosis not present

## 2022-03-25 ENCOUNTER — Ambulatory Visit (INDEPENDENT_AMBULATORY_CARE_PROVIDER_SITE_OTHER): Payer: Medicare Other

## 2022-03-25 DIAGNOSIS — I442 Atrioventricular block, complete: Secondary | ICD-10-CM | POA: Diagnosis not present

## 2022-03-26 LAB — CUP PACEART REMOTE DEVICE CHECK
Battery Remaining Longevity: 71 mo
Battery Voltage: 2.9 V
Brady Statistic AP VP Percent: 16.29 %
Brady Statistic AP VS Percent: 0.06 %
Brady Statistic AS VP Percent: 80.42 %
Brady Statistic AS VS Percent: 3.23 %
Brady Statistic RA Percent Paced: 17.89 %
Brady Statistic RV Percent Paced: 96.71 %
Date Time Interrogation Session: 20230515015644
Implantable Lead Implant Date: 20190124
Implantable Lead Implant Date: 20190124
Implantable Lead Location: 753859
Implantable Lead Location: 753860
Implantable Lead Model: 5076
Implantable Lead Model: 5076
Implantable Pulse Generator Implant Date: 20190124
Lead Channel Impedance Value: 323 Ohm
Lead Channel Impedance Value: 361 Ohm
Lead Channel Impedance Value: 380 Ohm
Lead Channel Impedance Value: 456 Ohm
Lead Channel Pacing Threshold Amplitude: 0.625 V
Lead Channel Pacing Threshold Amplitude: 0.625 V
Lead Channel Pacing Threshold Pulse Width: 0.4 ms
Lead Channel Pacing Threshold Pulse Width: 0.4 ms
Lead Channel Sensing Intrinsic Amplitude: 20.125 mV
Lead Channel Sensing Intrinsic Amplitude: 20.125 mV
Lead Channel Sensing Intrinsic Amplitude: 3.75 mV
Lead Channel Sensing Intrinsic Amplitude: 3.75 mV
Lead Channel Setting Pacing Amplitude: 2 V
Lead Channel Setting Pacing Amplitude: 2.5 V
Lead Channel Setting Pacing Pulse Width: 0.4 ms
Lead Channel Setting Sensing Sensitivity: 4 mV

## 2022-04-04 ENCOUNTER — Encounter: Payer: Self-pay | Admitting: Podiatry

## 2022-04-04 ENCOUNTER — Ambulatory Visit (INDEPENDENT_AMBULATORY_CARE_PROVIDER_SITE_OTHER): Payer: Medicare Other | Admitting: Podiatry

## 2022-04-04 DIAGNOSIS — M79674 Pain in right toe(s): Secondary | ICD-10-CM | POA: Diagnosis not present

## 2022-04-04 DIAGNOSIS — B351 Tinea unguium: Secondary | ICD-10-CM | POA: Diagnosis not present

## 2022-04-04 DIAGNOSIS — I739 Peripheral vascular disease, unspecified: Secondary | ICD-10-CM

## 2022-04-04 MED ORDER — DICLOFENAC SODIUM 1 % EX GEL: 4.0000 g | Freq: Four times a day (QID) | CUTANEOUS | 4 refills | Status: AC

## 2022-04-04 NOTE — Progress Notes (Signed)
  Subjective:  Patient ID: Steven Maldonado, male    DOB: January 27, 1941,  MRN: [id number]  Chief Complaint  Patient presents with   Nail Problem    Thick painful toenails, 3 month follow up     81 y.o. male presents with the above complaint. History confirmed with patient.  He is doing well no new issues since his last visit.   Objective:  Physical Exam: warm, good capillary refill, no trophic changes or ulcerative lesions, normal DP and PT pulses, and normal sensory exam.  Right Foot: dystrophic yellowed discolored nail plates with subungual debris and submetatarsal 5 callus  Assessment:   1. Pain due to onychomycosis of toenail of right foot   2. PAD (peripheral artery disease) ([place])       Plan:  Patient was evaluated and treated and all questions answered.  Patient educated on diabetes. Discussed proper diabetic foot care and discussed risks and complications of disease. Educated patient in depth on reasons to return to the office immediately should he/she discover anything concerning or new on the feet. All questions answered. Discussed proper shoes as well.   Discussed the etiology and treatment options for the condition in detail with the patient. Educated patient on the topical and oral treatment options for mycotic nails. Recommended debridement of the nails today. Sharp and mechanical debridement performed of all painful and mycotic nails today. Nails debrided in length and thickness using a nail nipper to level of comfort. Discussed treatment options including appropriate shoe gear. Follow up as needed for painful nails.   Voltaren gel as needed for intermittent arthritic foot pain   Return in about 3 months (around 07/05/2022) for at risk diabetic foot care.

## 2022-04-09 ENCOUNTER — Other Ambulatory Visit: Payer: Self-pay | Admitting: *Deleted

## 2022-04-09 NOTE — Patient Instructions (Signed)
Visit Information  Thank you for taking time to visit with me today. Please don't hesitate to contact me if I can be of assistance to you before our next scheduled telephone appointment.  Following are the goals we discussed today:  Current Barriers:  Knowledge Deficits related to plan of care for management of DMII   RNCM Clinical Goal(s):  Patient will verbalize understanding of plan for management of DMII as evidenced by continuation of monitoring blood sugars and adhering to diabetic diet through collaboration with RN Care manager, provider, and care team.   Interventions: Inter-disciplinary care team collaboration (see longitudinal plan of care) Evaluation of current treatment plan related to  self management and patient's adherence to plan as established by provider  Patient Goals/Self-Care Activities: Take medications as prescribed   Attend all scheduled provider appointments Call pharmacy for medication refills 3-7 days in advance of running out of medications Attend church or other social activities Perform all self care activities independently  Perform IADL's (shopping, preparing meals, housekeeping, managing finances) independently Call provider office for new concerns or questions  call the Suicide and Crisis Lifeline: 988 if experiencing a Mental Health or [place]  schedule appointment with eye doctor check blood sugar at prescribed times: three times daily check feet daily for cuts, sores or redness take the blood sugar meter to all doctor visits trim toenails straight across drink 6 to 8 glasses of water each day manage portion size  [id number] Patient fasting blood sugar is 135 today. His A1C is 8.3. patient appetite is good. He has not had any current falls. He has not been exercising. Per wife he has not been using his CPAP in over a month. RN discussed the importance of using the CPAP and the risks that can occur if not uses. Rn discussed exercising.    Our next appointment is by telephone on July 08, 2022  Please call the care guide team Johny Shock [phone]  if you need to cancel or reschedule your appointment.   Please call the Suicide and Crisis Lifeline: 988 if you are experiencing a Mental Health or [place] or need someone to talk to.  The patient verbalized understanding of instructions, educational materials, and care plan provided today and agreed to receive a mailed copy of patient instructions, educational materials, and care plan.   Telephone follow up appointment with care management team member scheduled for: The patient has been provided with contact information for the care management team and has been advised to call with any health related questions or concerns.   [place] Care Management [phone]

## 2022-04-09 NOTE — Patient Outreach (Signed)
[place] [place] [facility]) Care Management [place] Note   04/09/2022 Name:  Steven Maldonado MRN:  [id number] DOB:  [birth year]  Summary:  Fasting blood sugar is 135 today. His A1C is 8.3. Appetite is good. No current falls since last outreach.  He has not been exercising. Per wife he has not been using his CPAP in over a month. RN discussed the importance of using the CPAP and the risks that can occur if not used. RN discussed importance of exercising and mobility.  Recommendations/Changes made from today's visit: RN discussed importance of wearing CPAP RN discussed importance of exercising   Subjective: Steven Maldonado is an 81 y.o. year old male who is a primary patient of Lorrene Reid, [name]. The care management team was consulted for assistance with care management and/or care coordination needs.    RN Health Coach completed Telephone Visit today.   Objective:  Medications Reviewed Today     Reviewed by Criselda Peaches, DPM (Physician) on 04/04/22 at 2057  Med List Status: <None>   Medication Order Taking? Sig Documenting Provider Last Dose Status Informant  apixaban (ELIQUIS) 5 MG TABS tablet [id number] No Take 1 tablet (5 mg total) by mouth 2 (two) times daily. Imogene Burn, [name] Taking Active   atorvastatin (LIPITOR) 40 MG tablet [id number] No Take 1 tablet (40 mg total) by mouth daily. Elayne Snare, MD Taking Active   Blood Glucose Monitoring Suppl (FREESTYLE LITE) DEVI [id number] No 1 each by Does not apply route 2 (two) times daily. [name], [name] Taking Active Self  Blood Glucose Monitoring Suppl (FREESTYLE LITE) w/Device KIT [id number] No Use to check blood sugar 4 times a day Elayne Snare, MD Taking Active   Choline Fenofibrate (FENOFIBRIC ACID) 135 MG CPDR [id number] No Take 1 tablet by mouth daily. Lorrene Reid, [name] Taking Active   diclofenac Sodium (VOLTAREN) 1 % GEL [id number] Yes Apply 4 g topically 4 (four) times daily. Criselda Peaches, DPM  Active    docusate sodium (COLACE) 100 MG capsule [id number] No Take 300 mg by mouth daily.  [provider] Taking Active Self  ezetimibe (ZETIA) 10 MG tablet [id number] No Take 1 tablet (10 mg total) by mouth daily. Elayne Snare, MD Taking Active   furosemide (LASIX) 20 MG tablet [id number] No Take 1 tablet (20 mg total) by mouth every other day. Sherran Needs, NP Taking Active            Med Note Howell Rucks, Eppie Gibson   Fri Nov 23, 2021 11:45 AM) Dewaine Conger 4 days a week  glipiZIDE (GLUCOTROL XL) 5 MG 24 hr tablet [id number] No Take 1 tablet (5 mg total) by mouth daily with breakfast. Elayne Snare, MD Taking Active   glucose blood (FREESTYLE LITE) test strip [id number] No 1 EACH BY OTHER ROUTE 4 (FOUR) TIMES DAILY - BEFORE MEALS AND AT BEDTIME. Elayne Snare, MD Taking Active   insulin degludec [facility]) 200 UNIT/ML FlexTouch Pen [id number] No INJECT SUBCUTANEOUSLY 26   UNITS AT BEDTIME Abonza, Maritza, [name] Taking Active   insulin lispro (HUMALOG KWIKPEN) 100 UNIT/ML KwikPen [id number] No Inject 5-8 Units into the skin See admin instructions. Inject 5-8 units under the skin up to three times daily if eating sweets or high carb foods. Elayne Snare, MD Taking Active   Insulin Pen Needle (BD PEN NEEDLE NANO 2ND GEN) 32G X 4 MM MISC [id number] No USE 4 (FOUR) TIMES DAILY. Elayne Snare, MD Taking Active   Lancets (FREESTYLE)  lancets [id number] No Use to check blood sugar 4 times a day Reather Littler, MD Taking Active   levothyroxine (SYNTHROID) 200 MCG tablet [id number] No TAKE 1 TABLET BY MOUTH DAILY MONDAY-SATURDAY  TAKE 1/2 TABLET ON Delsa Grana, MD Taking Active   metoprolol tartrate (LOPRESSOR) 50 MG tablet [id number] No TAKE ONE AND ONE-HALF TABLET BY MOUTH TWICE A DAY Iran Ouch, MD Taking Active   mupirocin ointment (BACTROBAN) 2 % [id number] No Apply to great toe wound daily, with gauze and tape Mayer Masker, [name] Taking Active   MYRBETRIQ 25 MG TB24 tablet [id number] No TAKE 1 TABLET DAILY   Patient taking differently: Take 25 mg by mouth daily.   Mayer Masker, [name] Taking Active   OZEMPIC, 0.25 OR 0.5 MG/DOSE, 2 MG/1.5ML Namon Cirri [id number] No INJECT 0.5MG  SUBCUTANEOUSLYONCE A WEEK Reather Littler, MD Taking Active              SDOH:  (Social Determinants of Health) assessments and interventions performed:    Care Plan  Review of patient past medical history, allergies, medications, health status, including review of consultants reports, laboratory and other test data, was performed as part of comprehensive evaluation for care management services.   Care Plan : RN Care Manager Plan of Care  Updates made by Owen Pratte, Dennard Schaumann, RN since 04/09/2022 12:00 AM     Problem: Knowledge Deficit Related to Diabetes and Care Coordination   Priority: High     Long-Range Goal: Development Plan of Care for Management of Diabetes   Start Date: 11/23/2021  Expected End Date: 11/09/2022  Priority: High  Note:   Current Barriers:  Knowledge Deficits related to plan of care for management of DMII   RNCM Clinical Goal(s):  Patient will verbalize understanding of plan for management of DMII as evidenced by continuation of monitoring blood sugars and adhering to diabetic diet  through collaboration with RN Care manager, provider, and care team.   Interventions: Inter-disciplinary care team collaboration (see longitudinal plan of care) Evaluation of current treatment plan related to  self management and patient's adherence to plan as established by provider  Patient Goals/Self-Care Activities: Take medications as prescribed   Attend all scheduled provider appointments Call pharmacy for medication refills 3-7 days in advance of running out of medications Attend church or other social activities Perform all self care activities independently  Perform IADL's (shopping, preparing meals, housekeeping, managing finances) independently Call provider office for new concerns or questions   call the Suicide and Crisis Lifeline: 988 if experiencing a Mental Health or Behavioral Health Crisis  schedule appointment with eye doctor check blood sugar at prescribed times: three times daily check feet daily for cuts, sores or redness take the blood sugar meter to all doctor visits trim toenails straight across drink 6 to 8 glasses of water each day manage portion size  [id number] Patient fasting blood sugar is 135 today. His A1C is 8.3. patient appetite is good. He has not had any current falls. He has not been exercising. Per wife he has not been using his CPAP in over a month. RN discussed the importance of using the CPAP and the risks that can occur if not uses. Rn discussed exercising.       Plan: Telephone follow up appointment with care management team member scheduled for:  July 09, 2022 The patient has been provided with contact information for the care management team and has been advised to call with any health related questions or concerns.  [place] Care Management [phone]

## 2022-04-09 NOTE — Patient Outreach (Signed)
[place] [facility]) Care Management  04/09/2022  Steven Maldonado 10-Dec-1940 [id number]  RN Health Coach attempted follow up outreach call to patient.  Patient was unavailable. HIPPA compliance voicemail message left with return callback number.  Plan: RN will call patient again within 30 days.  [place] Care Management [phone]

## 2022-04-11 ENCOUNTER — Telehealth: Payer: Self-pay | Admitting: *Deleted

## 2022-04-11 ENCOUNTER — Other Ambulatory Visit: Payer: Self-pay | Admitting: Endocrinology

## 2022-04-11 NOTE — Telephone Encounter (Signed)
Patient calling for the name of the medicine sent by physician to [facility] drug.  Returned call giving name of medication and that the prescription has been sent to the pharmacy 04/04/22,verbalized understanding and said that he will give them a call.

## 2022-04-12 NOTE — Progress Notes (Signed)
Remote pacemaker transmission.   

## 2022-04-17 ENCOUNTER — Ambulatory Visit (INDEPENDENT_AMBULATORY_CARE_PROVIDER_SITE_OTHER): Payer: Medicare Other | Admitting: Physician Assistant

## 2022-04-17 ENCOUNTER — Encounter: Payer: Self-pay | Admitting: Physician Assistant

## 2022-04-17 VITALS — BP 91/54 | HR 93 | Temp 97.7°F | Ht 71.0 in | Wt 238.0 lb

## 2022-04-17 DIAGNOSIS — L739 Follicular disorder, unspecified: Secondary | ICD-10-CM | POA: Diagnosis not present

## 2022-04-17 MED ORDER — MUPIROCIN 2 % EX OINT: 1.0000 "application " | TOPICAL_OINTMENT | Freq: Two times a day (BID) | CUTANEOUS | 0 refills | Status: DC

## 2022-04-17 NOTE — Progress Notes (Signed)
Established patient acute visit   Patient: Steven Maldonado   DOB: 07/26/1941   81 y.o. Male  MRN: [id number] Visit Date: 04/17/2022  Chief Complaint  Patient presents with   Acute Visit   Subjective    HPI  Patient presents with c/o right sided neck pain on the back x couple of days. No fall/injury or fever. Denies pain with neck movement or back pain.    Medications: Outpatient Medications Prior to Visit  Medication Sig   apixaban (ELIQUIS) 5 MG TABS tablet Take 1 tablet (5 mg total) by mouth 2 (two) times daily.   atorvastatin (LIPITOR) 40 MG tablet Take 1 tablet (40 mg total) by mouth daily.   Blood Glucose Monitoring Suppl (FREESTYLE LITE) DEVI 1 each by Does not apply route 2 (two) times daily.   Blood Glucose Monitoring Suppl (FREESTYLE LITE) w/Device KIT Use to check blood sugar 4 times a day   Choline Fenofibrate (FENOFIBRIC ACID) 135 MG CPDR Take 1 tablet by mouth daily.   diclofenac Sodium (VOLTAREN) 1 % GEL Apply 4 g topically 4 (four) times daily.   docusate sodium (COLACE) 100 MG capsule Take 300 mg by mouth daily.    ezetimibe (ZETIA) 10 MG tablet Take 1 tablet (10 mg total) by mouth daily.   furosemide (LASIX) 20 MG tablet Take 1 tablet (20 mg total) by mouth every other day.   glipiZIDE (GLUCOTROL XL) 5 MG 24 hr tablet Take 1 tablet (5 mg total) by mouth daily with breakfast.   glucose blood (FREESTYLE LITE) test strip 1 EACH BY OTHER ROUTE 4 (FOUR) TIMES DAILY - BEFORE MEALS AND AT BEDTIME.   HUMALOG KWIKPEN 100 UNIT/ML KwikPen INJECT 5-8 UNITS SUBCUTANEOUSLY UP TO 3 TIMES A DAY IF EATING SWEETS OR HIGH CARBOHYDRATE FOODS   insulin degludec (TRESIBA FLEXTOUCH) 200 UNIT/ML FlexTouch Pen INJECT SUBCUTANEOUSLY 26   UNITS AT BEDTIME   Insulin Pen Needle (BD PEN NEEDLE NANO 2ND GEN) 32G X 4 MM MISC USE 4 (FOUR) TIMES DAILY.   Lancets (FREESTYLE) lancets Use to check blood sugar 4 times a day   levothyroxine (SYNTHROID) 200 MCG tablet TAKE 1 TABLET BY MOUTH DAILY  MONDAY-SATURDAY  TAKE 1/2 TABLET ON SUNDAY   metoprolol tartrate (LOPRESSOR) 50 MG tablet TAKE ONE AND ONE-HALF TABLET BY MOUTH TWICE A DAY   MYRBETRIQ 25 MG TB24 tablet TAKE 1 TABLET DAILY (Patient taking differently: Take 25 mg by mouth daily.)   OZEMPIC, 0.25 OR 0.5 MG/DOSE, 2 MG/1.5ML SOPN INJECT 0.5MG SUBCUTANEOUSLYONCE A WEEK   [DISCONTINUED] mupirocin ointment (BACTROBAN) 2 % Apply to great toe wound daily, with gauze and tape   No facility-administered medications prior to visit.    Review of Systems Review of Systems:  A fourteen system review of systems was performed and found to be positive as per HPI.     Objective    BP (!) 91/54   Pulse 93   Temp 97.7 F (36.5 C)   Ht 5' 11" (1.803 m)   Wt 238 lb (108 kg)   SpO2 97%   BMI 33.19 kg/m    Physical Exam  General:  Pleasant and cooperative, appropriate for stated age.  Neuro:  Alert and oriented,  extra-ocular muscles intact  HEENT:  Normocephalic, atraumatic, neck supple  Skin:  small inflamed hair follicle near base of the neck (R) which is tender to palpation  MSK: Full cervical ROM, no c-spine tenderness Cardiac:  RRR, S1 S2 Respiratory: Speaking in full sentences, unlabored.    Vascular:  Ext warm, no cyanosis apprec.; cap RF less 2 sec. Psych:  No HI/SI, judgement and insight good, Euthymic mood. Full Affect.   No results found for any visits on 04/17/22.  Assessment & Plan     Discussed with patient has s/sx consistent with folliculitis. Will start topical antibiotic therapy with mupirocin twice daily x 7 days. Advised can apply heat therapy as well. Follow-up if symptoms fail to improve or worsen.     Return if symptoms worsen or fail to improve.        Lorrene Reid, [name]  [facility] at [facility] [phone] (phone) [phone] (fax)  [place]

## 2022-04-17 NOTE — Patient Instructions (Addendum)
Apply Bactroban twice daily x 7 days.  Folliculitis  Folliculitis is inflammation of the hair follicles. Folliculitis most commonly occurs on the scalp, thighs, legs, back, and buttocks. However, it can occur anywhere on the body. What are the causes? This condition may be caused by: A bacterial infection (common). A fungal infection. A viral infection. Contact with certain chemicals, especially oils and tars. Shaving or waxing. Greasy ointments or creams applied to the skin. Long-lasting folliculitis and folliculitis that keeps coming back may be caused by bacteria. This bacteria can live anywhere on your skin and is often found in the nostrils. What increases the risk? You are more likely to develop this condition if you have: A weakened immune system. Diabetes. Obesity. What are the signs or symptoms? Symptoms of this condition include: Redness. Soreness. Swelling. Itching. Small white or yellow, pus-filled, itchy spots (pustules) that appear over a reddened area. If there is an infection that goes deep into the follicle, these may develop into a boil (furuncle). A group of closely packed boils (carbuncle). These tend to form in hairy, sweaty areas of the body. How is this diagnosed? This condition is diagnosed with a skin exam. To find what is causing the condition, your health care provider may take a sample of one of the pustules or boils for testing in a lab. How is this treated? This condition may be treated by: Applying warm compresses to the affected areas. Taking an antibiotic medicine or applying an antibiotic medicine to the skin. Applying or bathing with an antiseptic solution. Taking an over-the-counter medicine to help with itching. Having a procedure to drain any pustules or boils. This may be done if a pustule or boil contains a lot of pus or fluid. Having laser hair removal. This may be done to treat long-lasting folliculitis. Follow these instructions at  home: Managing pain and swelling  If directed, apply heat to the affected area as often as told by your health care provider. Use the heat source that your health care provider recommends, such as a moist heat pack or a heating pad. Place a towel between your skin and the heat source. Leave the heat on for 20-30 minutes. Remove the heat if your skin turns bright red. This is especially important if you are unable to feel pain, heat, or cold. You may have a greater risk of getting burned. General instructions If you were prescribed an antibiotic medicine, take it or apply it as told by your health care provider. Do not stop using the antibiotic even if your condition improves. Check the irritated area every day for signs of infection. Check for: Redness, swelling, or pain. Fluid or blood. Warmth. Pus or a bad smell. Do not shave irritated skin. Take over-the-counter and prescription medicines only as told by your health care provider. Keep all follow-up visits as told by your health care provider. This is important. Get help right away if: You have more redness, swelling, or pain in the affected area. Red streaks are spreading from the affected area. You have a fever. Summary Folliculitis is inflammation of the hair follicles. Folliculitis most commonly occurs on the scalp, thighs, legs, back, and buttocks. This condition may be treated by taking an antibiotic medicine or applying an antibiotic medicine to the skin, and applying or bathing with an antiseptic solution. If you were prescribed an antibiotic medicine, take it or apply it as told by your health care provider. Do not stop using the antibiotic even if your condition improves.  Get help right away if you have new or worsening symptoms. Keep all follow-up visits as told by your health care provider. This is important. This information is not intended to replace advice given to you by your health care provider. Make sure you discuss  any questions you have with your health care provider. Document Revised: 08/23/2021 Document Reviewed: 08/23/2021 Elsevier Patient Education  [place].

## 2022-05-24 ENCOUNTER — Other Ambulatory Visit: Payer: Self-pay | Admitting: Physician Assistant

## 2022-05-27 ENCOUNTER — Ambulatory Visit: Payer: Medicare Other | Admitting: Endocrinology

## 2022-05-27 DIAGNOSIS — E1165 Type 2 diabetes mellitus with hyperglycemia: Secondary | ICD-10-CM

## 2022-05-28 ENCOUNTER — Other Ambulatory Visit: Payer: Self-pay | Admitting: *Deleted

## 2022-05-28 ENCOUNTER — Encounter: Payer: Self-pay | Admitting: *Deleted

## 2022-05-28 NOTE — Patient Outreach (Signed)
[place] [facility]) Care Management  05/28/2022  Steven Maldonado May 15, 1941 [id number]   RN Health Coach attempted follow up outreach call to patient.  Patient was unavailable. RN Health Coach will close case. A1C is 8.3.  Care Coordinator will continue follow up care.  Plan: Care Coordinator will continue follow up care.  [place] Care Management [phone]

## 2022-05-29 ENCOUNTER — Encounter (INDEPENDENT_AMBULATORY_CARE_PROVIDER_SITE_OTHER): Payer: Medicare Other | Admitting: Ophthalmology

## 2022-05-29 DIAGNOSIS — H35033 Hypertensive retinopathy, bilateral: Secondary | ICD-10-CM

## 2022-05-29 DIAGNOSIS — I10 Essential (primary) hypertension: Secondary | ICD-10-CM

## 2022-05-29 DIAGNOSIS — E113393 Type 2 diabetes mellitus with moderate nonproliferative diabetic retinopathy without macular edema, bilateral: Secondary | ICD-10-CM | POA: Diagnosis not present

## 2022-05-29 DIAGNOSIS — H43813 Vitreous degeneration, bilateral: Secondary | ICD-10-CM

## 2022-05-29 NOTE — Patient Instructions (Signed)

## 2022-05-30 ENCOUNTER — Encounter: Payer: Self-pay | Admitting: Physician Assistant

## 2022-05-30 ENCOUNTER — Ambulatory Visit (INDEPENDENT_AMBULATORY_CARE_PROVIDER_SITE_OTHER): Payer: Medicare Other | Admitting: Physician Assistant

## 2022-05-30 VITALS — BP 99/62 | HR 92 | Temp 97.7°F | Ht 71.0 in | Wt 236.0 lb

## 2022-05-30 DIAGNOSIS — L89899 Pressure ulcer of other site, unspecified stage: Secondary | ICD-10-CM | POA: Diagnosis not present

## 2022-05-30 MED ORDER — MUPIROCIN 2 % EX OINT: 1.0000 | TOPICAL_OINTMENT | Freq: Two times a day (BID) | CUTANEOUS | 0 refills | Status: AC

## 2022-05-30 MED ORDER — AMOXICILLIN-POT CLAVULANATE 875-125 MG PO TABS: 1.0000 | ORAL_TABLET | Freq: Two times a day (BID) | ORAL | 0 refills | Status: AC

## 2022-05-30 NOTE — Progress Notes (Signed)
Established patient acute visit   Patient: Steven Maldonado   DOB: 10-Sep-1941   81 y.o. Male  MRN: [id number] Visit Date: 05/30/2022  Chief Complaint  Patient presents with   Open Wound   Subjective    HPI  Patient presents with c/o ulcer. States ulcer appeared 3 weeks ago. No fever, purulent drainage, chills or swelling. States lesion still not completely healed. Has been applying triple antibiotic ointment. Reports will be seeing the prosthesis specialist next week.     Medications: Outpatient Medications Prior to Visit  Medication Sig   apixaban (ELIQUIS) 5 MG TABS tablet Take 1 tablet (5 mg total) by mouth 2 (two) times daily.   atorvastatin (LIPITOR) 40 MG tablet Take 1 tablet (40 mg total) by mouth daily.   Blood Glucose Monitoring Suppl (FREESTYLE LITE) DEVI 1 each by Does not apply route 2 (two) times daily.   Blood Glucose Monitoring Suppl (FREESTYLE LITE) w/Device KIT Use to check blood sugar 4 times a day   Choline Fenofibrate (FENOFIBRIC ACID) 135 MG CPDR TAKE 1 CAPSULE DAILY   diclofenac Sodium (VOLTAREN) 1 % GEL Apply 4 g topically 4 (four) times daily.   docusate sodium (COLACE) 100 MG capsule Take 300 mg by mouth daily.    ezetimibe (ZETIA) 10 MG tablet Take 1 tablet (10 mg total) by mouth daily.   furosemide (LASIX) 20 MG tablet Take 1 tablet (20 mg total) by mouth every other day.   glipiZIDE (GLUCOTROL XL) 5 MG 24 hr tablet Take 1 tablet (5 mg total) by mouth daily with breakfast.   glucose blood (FREESTYLE LITE) test strip 1 EACH BY OTHER ROUTE 4 (FOUR) TIMES DAILY - BEFORE MEALS AND AT BEDTIME.   HUMALOG KWIKPEN 100 UNIT/ML KwikPen INJECT 5-8 UNITS SUBCUTANEOUSLY UP TO 3 TIMES A DAY IF EATING SWEETS OR HIGH CARBOHYDRATE FOODS   insulin degludec (TRESIBA FLEXTOUCH) 200 UNIT/ML FlexTouch Pen INJECT SUBCUTANEOUSLY 26   UNITS AT BEDTIME   Insulin Pen Needle (BD PEN NEEDLE NANO 2ND GEN) 32G X 4 MM MISC USE 4 (FOUR) TIMES DAILY.   Lancets (FREESTYLE) lancets Use to  check blood sugar 4 times a day   levothyroxine (SYNTHROID) 200 MCG tablet TAKE 1 TABLET BY MOUTH DAILY MONDAY-SATURDAY  TAKE 1/2 TABLET ON SUNDAY   metoprolol tartrate (LOPRESSOR) 50 MG tablet TAKE ONE AND ONE-HALF TABLET BY MOUTH TWICE A DAY   MYRBETRIQ 25 MG TB24 tablet TAKE 1 TABLET DAILY (Patient taking differently: Take 25 mg by mouth daily.)   OZEMPIC, 0.25 OR 0.5 MG/DOSE, 2 MG/1.5ML SOPN INJECT 0.5MG SUBCUTANEOUSLYONCE A WEEK   [DISCONTINUED] mupirocin ointment (BACTROBAN) 2 % Apply 1 application. topically 2 (two) times daily.   No facility-administered medications prior to visit.    Review of Systems Review of Systems:  A fourteen system review of systems was performed and found to be positive as per HPI.     Objective    BP 99/62   Pulse 92   Temp 97.7 F (36.5 C)   Ht '5\' 11"'  (1.803 m)   Wt 236 lb (107 kg)   SpO2 96%   BMI 32.92 kg/m    Physical Exam  General:  Well Developed, well nourished, appropriate for stated age.  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 Vascular:  Ext warm, no cyanosis apprec.; cap RF  less 2 sec. Psych:  No HI/SI, judgement and insight good, Euthymic mood. Full Affect.   No results found for any visits on 05/30/22.  Assessment & Plan     Patient presenting with stage 2 pressure ulcer. Discussed padding especially with the use of prosthesis. Patient has multiple co-morbidities for delayed wound healing so will start oral antibiotic therapy with Augmentin 875-125 mg BID x 10 days and recommend to apply Bactroban 2% BID. Advised to follow-up if ulcer fails to improve or worsen before his appointment with prosthesis specialist. Pt verbalized understanding.    Return if symptoms worsen or fail to improve.        Lorrene Reid, [name]  [facility] Primary  Care at [facility] [phone] (phone) [phone] (fax)  [place]

## 2022-06-13 ENCOUNTER — Other Ambulatory Visit: Payer: Self-pay

## 2022-06-24 ENCOUNTER — Ambulatory Visit: Payer: Medicare Other

## 2022-06-24 DIAGNOSIS — Z85828 Personal history of other malignant neoplasm of skin: Secondary | ICD-10-CM | POA: Diagnosis not present

## 2022-06-24 DIAGNOSIS — D2272 Melanocytic nevi of left lower limb, including hip: Secondary | ICD-10-CM | POA: Diagnosis not present

## 2022-06-24 DIAGNOSIS — D2261 Melanocytic nevi of right upper limb, including shoulder: Secondary | ICD-10-CM | POA: Diagnosis not present

## 2022-06-24 DIAGNOSIS — D2262 Melanocytic nevi of left upper limb, including shoulder: Secondary | ICD-10-CM | POA: Diagnosis not present

## 2022-06-24 DIAGNOSIS — L570 Actinic keratosis: Secondary | ICD-10-CM | POA: Diagnosis not present

## 2022-06-25 LAB — CUP PACEART REMOTE DEVICE CHECK
Battery Remaining Longevity: 67 mo
Battery Voltage: 2.9 V
Brady Statistic AP VP Percent: 10.89 %
Brady Statistic AP VS Percent: 0.05 %
Brady Statistic AS VP Percent: 86.17 %
Brady Statistic AS VS Percent: 2.89 %
Brady Statistic RA Percent Paced: 12.17 %
Brady Statistic RV Percent Paced: 97.06 %
Date Time Interrogation Session: 20230815004414
Implantable Lead Implant Date: 20190124
Implantable Lead Implant Date: 20190124
Implantable Lead Location: 753859
Implantable Lead Location: 753860
Implantable Lead Model: 5076
Implantable Lead Model: 5076
Implantable Pulse Generator Implant Date: 20190124
Lead Channel Impedance Value: 304 Ohm
Lead Channel Impedance Value: 342 Ohm
Lead Channel Impedance Value: 342 Ohm
Lead Channel Impedance Value: 456 Ohm
Lead Channel Pacing Threshold Amplitude: 0.625 V
Lead Channel Pacing Threshold Amplitude: 0.625 V
Lead Channel Pacing Threshold Pulse Width: 0.4 ms
Lead Channel Pacing Threshold Pulse Width: 0.4 ms
Lead Channel Sensing Intrinsic Amplitude: 2 mV
Lead Channel Sensing Intrinsic Amplitude: 2 mV
Lead Channel Sensing Intrinsic Amplitude: 20.125 mV
Lead Channel Sensing Intrinsic Amplitude: 20.125 mV
Lead Channel Setting Pacing Amplitude: 2 V
Lead Channel Setting Pacing Amplitude: 2.5 V
Lead Channel Setting Pacing Pulse Width: 0.4 ms
Lead Channel Setting Sensing Sensitivity: 4 mV

## 2022-07-02 ENCOUNTER — Other Ambulatory Visit: Payer: Self-pay | Admitting: Endocrinology

## 2022-07-05 ENCOUNTER — Ambulatory Visit: Payer: Medicare Other | Admitting: Cardiovascular Disease

## 2022-07-08 ENCOUNTER — Ambulatory Visit: Payer: Medicare Other | Admitting: *Deleted

## 2022-07-09 ENCOUNTER — Ambulatory Visit: Payer: Medicare Other | Admitting: Podiatry

## 2022-07-09 ENCOUNTER — Ambulatory Visit: Payer: Medicare Other | Admitting: *Deleted

## 2022-07-09 ENCOUNTER — Ambulatory Visit: Payer: Medicare Other | Admitting: Cardiovascular Disease

## 2022-07-16 ENCOUNTER — Ambulatory Visit: Payer: Self-pay

## 2022-07-17 ENCOUNTER — Other Ambulatory Visit: Payer: Self-pay | Admitting: *Deleted

## 2022-07-17 DIAGNOSIS — I739 Peripheral vascular disease, unspecified: Secondary | ICD-10-CM

## 2022-07-17 NOTE — Patient Instructions (Signed)
Visit Information  Thank you for taking time to visit with me today. Please don't hesitate to contact me if I can be of assistance to you.   Following are the goals we discussed today:   Goals Addressed             This Visit's Progress    Iwant ti monitor my blood sugars       Care Coordination Interventions: Active listening / Reflection utilized  Emotional Support Provided Problem [place] strategies reviewed Reviewed medications with patient and discussed importance of medication adherence Counseled on importance of regular laboratory monitoring as prescribed Discussed plans with patient for ongoing care management follow up and provided patient with direct contact information for care management team Review of patient status, including review of consultants reports, relevant laboratory and other test results, and medications completed I spoke with Steven Maldonado' wife, Steven Maldonado, about his health. His blood sugar levels range from 125 to 200, and he dislikes exercising or wearing his CPAP. His blood sugar dropped to 60 once, but they had pills to raise it. Steven Maldonado tries to encourage him, but he's hesitant to act, but she hopes he can prioritize his health.         Our next appointment is by telephone on 11/8 at 11 am  Please call the care guide team at [phone] if you need to cancel or reschedule your appointment.   If you are experiencing a Mental Health or [place] or need someone to talk to, please call 1-800-273-TALK (toll free, 24 hour hotline)  The patient verbalized understanding of instructions, educational materials, and care plan provided today.   Lazaro Arms RN, BSN, [place] [facility]   Phone: [phone]

## 2022-07-17 NOTE — Patient Outreach (Signed)
  Care Coordination   Initial Visit Note   07/16/2022 Name: Steven Maldonado MRN: [id number] DOB: 12/24/1940  Steven Maldonado is a 81 y.o. year old male who sees Lorrene Reid, [place] for primary care. I spoke with  Steven Maldonado / wife Steven Maldonado on ROI by phone today.  What matters to the patients health and wellness today?  Want to manage blood sugars.    Goals Addressed             This Visit's Progress    Iwant ti monitor my blood sugars       Care Coordination Interventions: Active listening / Reflection utilized  Emotional Support Provided Problem [place] strategies reviewed Reviewed medications with patient and discussed importance of medication adherence Counseled on importance of regular laboratory monitoring as prescribed Discussed plans with patient for ongoing care management follow up and provided patient with direct contact information for care management team Review of patient status, including review of consultants reports, relevant laboratory and other test results, and medications completed I spoke with Steven Maldonado' wife, Steven Maldonado, about his health. His blood sugar levels range from 125 to 200, and he dislikes exercising or wearing his CPAP. His blood sugar dropped to 60 once, but they had pills to raise it. Steven Maldonado tries to encourage him, but he's hesitant to act, but she hopes he can prioritize his health.         SDOH assessments and interventions completed:  No     Care Coordination Interventions Activated:  Yes  Care Coordination Interventions:  Yes, provided   Follow up plan: Follow up call scheduled for 11/8 at 11 am    Encounter Outcome:  Pt. Visit Completed   Lazaro Arms RN, BSN, [place] [facility]   Phone: [phone]

## 2022-07-22 ENCOUNTER — Ambulatory Visit (INDEPENDENT_AMBULATORY_CARE_PROVIDER_SITE_OTHER): Payer: Medicare Other | Admitting: Podiatry

## 2022-07-22 DIAGNOSIS — M79674 Pain in right toe(s): Secondary | ICD-10-CM | POA: Diagnosis not present

## 2022-07-22 DIAGNOSIS — B351 Tinea unguium: Secondary | ICD-10-CM | POA: Diagnosis not present

## 2022-07-22 DIAGNOSIS — I739 Peripheral vascular disease, unspecified: Secondary | ICD-10-CM

## 2022-07-22 NOTE — Progress Notes (Signed)
  Subjective:  Patient ID: Steven Maldonado, male    DOB: 1941-05-22,  MRN: [id number]  Chief Complaint  Patient presents with   Nail Problem    Diabetic nail trim, A1C  8.3, status post BKA left    81 y.o. male presents with the above complaint. History confirmed with patient.  He is doing well no new issues since his last visit.   Objective:  Physical Exam: warm, good capillary refill, no trophic changes or ulcerative lesions, normal DP and PT pulses, and normal sensory exam.  Right Foot: dystrophic yellowed discolored nail plates with subungual debris and callus resolved  Assessment:   1. Pain due to onychomycosis of toenail of right foot   2. PAD (peripheral artery disease) ([place])       Plan:  Patient was evaluated and treated and all questions answered.  Patient educated on diabetes. Discussed proper diabetic foot care and discussed risks and complications of disease. Educated patient in depth on reasons to return to the office immediately should he/she discover anything concerning or new on the feet. All questions answered. Discussed proper shoes as well.   Discussed the etiology and treatment options for the condition in detail with the patient. Educated patient on the topical and oral treatment options for mycotic nails. Recommended debridement of the nails today. Sharp and mechanical debridement performed of all painful and mycotic nails today. Nails debrided in length and thickness using a nail nipper to level of comfort. Discussed treatment options including appropriate shoe gear. Follow up as needed for painful nails.     Return in about 3 months (around 10/21/2022) for at risk diabetic foot care.

## 2022-07-23 ENCOUNTER — Ambulatory Visit: Payer: Medicare Other | Admitting: Cardiovascular Disease

## 2022-08-02 ENCOUNTER — Other Ambulatory Visit: Payer: Medicare Other

## 2022-08-02 ENCOUNTER — Other Ambulatory Visit: Payer: Self-pay | Admitting: Endocrinology

## 2022-08-02 DIAGNOSIS — E039 Hypothyroidism, unspecified: Secondary | ICD-10-CM

## 2022-08-02 DIAGNOSIS — E1165 Type 2 diabetes mellitus with hyperglycemia: Secondary | ICD-10-CM

## 2022-08-03 ENCOUNTER — Other Ambulatory Visit: Payer: Self-pay | Admitting: Physician Assistant

## 2022-08-06 ENCOUNTER — Encounter: Payer: Self-pay | Admitting: Cardiovascular Disease

## 2022-08-06 ENCOUNTER — Ambulatory Visit: Payer: Self-pay | Admitting: Endocrinology

## 2022-08-06 ENCOUNTER — Ambulatory Visit: Payer: Medicare Other | Attending: Cardiovascular Disease | Admitting: Cardiovascular Disease

## 2022-08-06 VITALS — BP 128/68 | HR 90 | Ht 71.0 in | Wt 236.8 lb

## 2022-08-06 DIAGNOSIS — Z09 Encounter for follow-up examination after completed treatment for conditions other than malignant neoplasm: Secondary | ICD-10-CM | POA: Insufficient documentation

## 2022-08-06 DIAGNOSIS — I739 Peripheral vascular disease, unspecified: Secondary | ICD-10-CM | POA: Diagnosis not present

## 2022-08-06 DIAGNOSIS — I2510 Atherosclerotic heart disease of native coronary artery without angina pectoris: Secondary | ICD-10-CM | POA: Diagnosis not present

## 2022-08-06 DIAGNOSIS — E785 Hyperlipidemia, unspecified: Secondary | ICD-10-CM | POA: Diagnosis not present

## 2022-08-06 NOTE — Progress Notes (Signed)
Cardiology Office Note   Date:  08/06/2022   ID:  Steven Maldonado, Steven Maldonado [birth year], MRN [id number]  PCP:  Lorrene Reid, [name]  Cardiologist:  Dr. Angelena Form  No chief complaint on file.      History of Present Illness: Steven Maldonado is a 81 y.o. male who presents for  a followup visit regarding  PAD . 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 He is followed by me for peripheral arterial disease.  I performed successful angioplasty and drug-coated balloon angioplasty to the right popliteal artery in 2015.  He was subsequently seen by Dr. 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   He was seen in 2021 for small ulceration on the right great toe .  He had Doppler studies done which showed an occluded heavily calcified distal SFA/popliteal artery. Angiography in October of 2021 showed no significant aortoiliac disease.  On the right side, there was severe heavily calcified stenosis in the distal SFA with heavily calcified occluded popliteal artery above the knee with severe stenosis affecting the distal anterior tibial artery, occluded distal posterior tibial artery and occluded distal peroneal artery.  I performed successful orbital atherectomy and drug-coated balloon angioplasty to the right distal SFA and popliteal arteries with excellent results.    Most recent Doppler studies in April showed noncompressible vessels.  Duplex showed patent right SFA and popliteal arteries with moderately elevated velocities.  Has been doing well overall with no chest pain or shortness of breath.  He denies right leg pain or ulceration.  Past Medical History:  Diagnosis Date   Chronic diastolic CHF (congestive heart failure) (HCC)    CKD (chronic kidney disease),  stage III (HCC)    Constipation    Coronary artery disease    a. Cath February 2012 in [facility], occluded RCA with collaterals   DM type 2 (diabetes mellitus, type 2) ([place])    Essential hypertension    Hyperlipidemia    Neuropathy    feet   Pacemaker    a. symptomatic brady after TAVR s/p MDT PPM by Dr. Curt Bears 12/04/17   Persistent atrial fibrillation (HCC)    PONV (postoperative nausea and vomiting)    after valve surgery   PVD (peripheral vascular disease) ([place])    a. s/p R popliteal artery stenosis tx with drug-coated balloon 05/2014, followed by Dr. Fletcher Anon.   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 12/02/17: s/p TAVR   Skin cancer    Sleep apnea with use of continuous positive airway pressure (CPAP)    04-11-11 AHI was 32.9 and titrated to 15 cm H20, DME is [facility]   Subclinical hypothyroidism     Past Surgical History:  Procedure Laterality Date   ABDOMINAL ANGIOGRAM N/A 06/08/2014   Procedure: ABDOMINAL ANGIOGRAM;  Surgeon: Wellington Hampshire, MD;  Location: [place] CATH [facility];  Service: Cardiovascular;  Laterality: N/A;   ABDOMINAL AORTOGRAM N/A 04/09/2018   Procedure: ABDOMINAL AORTOGRAM;  Surgeon: Conrad Silverhill, MD;  Location: [place] [facility];  Service: Cardiovascular;  Laterality: N/A;   ABDOMINAL AORTOGRAM W/LOWER EXTREMITY N/A 08/23/2020   Procedure: ABDOMINAL AORTOGRAM W/LOWER EXTREMITY;  Surgeon: Wellington Hampshire, MD;  Location: [place] [facility];  Service: Cardiovascular;  Laterality: N/A;   AMPUTATION Left 04/17/2018   Procedure: LEFT FOOT 3RD RAY AMPUTATION;  Surgeon: Newt Minion, MD;  Location: [place];  Service: Orthopedics;  Laterality: Left;   AMPUTATION Left 05/28/2018   Procedure: LEFT AMPUTATION BELOW KNEE;  Surgeon: Wylene Simmer, MD;  Location: [place];  Service: Orthopedics;  Laterality: Left;   [place] N/A 11/08/2020   Procedure: ATRIAL FIBRILLATION ABLATION;  Surgeon: Constance Haw, MD;  Location: [place] [facility];  Service: Cardiovascular;  Laterality: N/A;   BELOW KNEE LEG AMPUTATION Left 05/28/2018   CARDIAC CATHETERIZATION  12/2010   CARDIOVERSION  07/2011   CARDIOVERSION N/A 04/18/2014   Procedure: CARDIOVERSION;  Surgeon: Dorothy Spark, MD;  Location: [place];  Service: Cardiovascular;  Laterality: N/A;   CARDIOVERSION N/A 11/03/2015   Procedure: CARDIOVERSION;  Surgeon: Lelon Perla, MD;  Location: [facility] ENDOSCOPY;  Service: Cardiovascular;  Laterality: N/A;   CARDIOVERSION N/A 05/08/2017   Procedure: CARDIOVERSION;  Surgeon: Dorothy Spark, MD;  Location: [place];  Service: Cardiovascular;  Laterality: N/A;   CARDIOVERSION N/A 07/28/2017   Procedure: CARDIOVERSION;  Surgeon: Dorothy Spark, MD;  Location: [facility] ENDOSCOPY;  Service: Cardiovascular;  Laterality: N/A;   CARDIOVERSION N/A 02/08/2020   Procedure: CARDIOVERSION;  Surgeon: Buford Dresser, MD;  Location: [facility] ENDOSCOPY;  Service: Cardiovascular;  Laterality: N/A;   CARDIOVERSION N/A 03/15/2020   Procedure: CARDIOVERSION;  Surgeon: Acie Fredrickson Wonda Cheng, MD;  Location: [facility] ENDOSCOPY;  Service: Cardiovascular;  Laterality: N/A;   CARDIOVERSION N/A 09/20/2020   Procedure: CARDIOVERSION;  Surgeon: Werner Lean, MD;  Location: [place];  Service: Cardiovascular;  Laterality: N/A;   LOWER EXTREMITY ANGIOGRAM N/A 06/08/2014   Procedure: LOWER EXTREMITY ANGIOGRAM;  Surgeon: Wellington Hampshire, MD;  Location: [place] CATH LAB;  Service: Cardiovascular;  Laterality: N/A;   LOWER EXTREMITY ANGIOGRAPHY Left 04/09/2018   Procedure: Lower Extremity Angiography;  Surgeon: Conrad Fairmead, MD;  Location: [place] CV LAB;  Service: Cardiovascular;  Laterality: Left;   PACEMAKER IMPLANT N/A 12/04/2017   Procedure: PACEMAKER IMPLANT;  Surgeon: Constance Haw, MD;  Location: [place] CV LAB;  Service: Cardiovascular;  Laterality: N/A;   PERIPHERAL VASCULAR ATHERECTOMY  08/23/2020   Procedure: PERIPHERAL VASCULAR ATHERECTOMY;  Surgeon: Wellington Hampshire, MD;  Location: [place] CV LAB;  Service: Cardiovascular;;   PERIPHERAL VASCULAR BALLOON ANGIOPLASTY Left 04/09/2018   Procedure: PERIPHERAL VASCULAR BALLOON ANGIOPLASTY;  Surgeon: Conrad Pisgah, MD;  Location: [place] CV LAB;  Service: Cardiovascular;  Laterality: Left;  SFA   POPLITEAL ARTERY ANGIOPLASTY Right 06/08/2014   Archie Endo 06/08/2014   RIGHT/LEFT HEART CATH AND CORONARY ANGIOGRAPHY N/A 10/08/2017   Procedure: RIGHT/LEFT HEART CATH AND CORONARY ANGIOGRAPHY;  Surgeon: Burnell Blanks, MD;  Location: [place] [facility];  Service: Cardiovascular;  Laterality: N/A;   SKIN CANCER EXCISION Bilateral    "have had them cut off back of neck X 2; off left upper arm; right wrist, near right shoulder blade" (06/08/2014)   TEE WITHOUT CARDIOVERSION N/A 12/02/2017   Procedure: TRANSESOPHAGEAL ECHOCARDIOGRAM (TEE);  Surgeon: Burnell Blanks, MD;  Location: [place];  Service: Open Heart Surgery;  Laterality: N/A;   TEMPORARY PACEMAKER N/A 12/04/2017   Procedure: TEMPORARY PACEMAKER;  Surgeon: Leonie Man, MD;  Location: [place] CV LAB;  Service: Cardiovascular;  Laterality: N/A;   TRANSCATHETER AORTIC VALVE  REPLACEMENT, TRANSFEMORAL N/A 12/02/2017   Procedure: TRANSCATHETER AORTIC VALVE REPLACEMENT, TRANSFEMORAL;  Surgeon: Burnell Blanks, MD;  Location: [place];  Service: Open Heart Surgery;  Laterality: N/A;  using Edwards Sapien 3 Transcatheter Heart Valve size 12m     Current Outpatient Medications  Medication Sig Dispense Refill   amoxicillin-clavulanate (AUGMENTIN) 875-125 MG tablet Take 1 tablet by mouth 2 (two) times daily. 20 tablet 0   apixaban (ELIQUIS) 5 MG TABS tablet Take 1 tablet (5 mg total) by mouth 2 (two) times daily. 180 tablet 2   atorvastatin (LIPITOR) 40 MG tablet Take  1 tablet (40 mg total) by mouth daily. 90 tablet 3   Blood Glucose Monitoring Suppl (FREESTYLE LITE) DEVI 1 each by Does not apply route 2 (two) times daily. 1 each 0   Blood Glucose Monitoring Suppl (FREESTYLE LITE) w/Device KIT Use to check blood sugar 4 times a day 1 kit 0   Choline Fenofibrate (FENOFIBRIC ACID) 135 MG CPDR TAKE 1 CAPSULE DAILY 90 capsule 0   diclofenac Sodium (VOLTAREN) 1 % GEL Apply 4 g topically 4 (four) times daily. 100 g 4   docusate sodium (COLACE) 100 MG capsule Take 300 mg by mouth daily.      ezetimibe (ZETIA) 10 MG tablet TAKE 1 TABLET DAILY 90 tablet 3   furosemide (LASIX) 20 MG tablet Take 1 tablet (20 mg total) by mouth every other day.     glipiZIDE (GLUCOTROL XL) 5 MG 24 hr tablet Take 1 tablet (5 mg total) by mouth daily with breakfast. 90 tablet 1   glucose blood (FREESTYLE LITE) test strip 1 EACH BY OTHER ROUTE 4 (FOUR) TIMES DAILY - BEFORE MEALS AND AT BEDTIME. 400 strip 3   HUMALOG KWIKPEN 100 UNIT/ML KwikPen INJECT 5-8 UNITS SUBCUTANEOUSLY UP TO 3 TIMES A DAY IF EATING SWEETS OR HIGH CARBOHYDRATE FOODS 30 mL 3   Insulin Pen Needle (BD PEN NEEDLE NANO 2ND GEN) 32G X 4 MM MISC USE 4 (FOUR) TIMES DAILY. 200 each 2   Lancets (FREESTYLE) lancets Use to check blood sugar 4 times a day 400 each 3   levothyroxine (SYNTHROID) 200 MCG tablet TAKE 1 TABLET BY MOUTH DAILY MONDAY-SATURDAY  TAKE 1/2 TABLET ON SUNDAY 90 tablet 2   metoprolol tartrate (LOPRESSOR) 50 MG tablet TAKE ONE AND ONE-HALF TABLET BY MOUTH TWICE A DAY 270 tablet 3   mupirocin ointment (BACTROBAN) 2 % Apply 1 Application topically 2 (two) times daily. 30 g 0   MYRBETRIQ 25 MG TB24 tablet TAKE 1 TABLET DAILY (Patient taking differently: Take 25 mg by mouth daily.) 90 tablet 2   OZEMPIC, 0.25 OR 0.5 MG/DOSE, 2 MG/1.5ML SOPN INJECT 0.5MG SUBCUTANEOUSLYONCE A WEEK 6 mL 1   TRESIBA FLEXTOUCH 200 UNIT/ML FlexTouch Pen INJECT SUBCUTANEOUSLY 26   UNITS AT BEDTIME 18 mL 1   No current facility-administered  medications for this visit.    Allergies:   Patient has no known allergies.    Social History:  The patient  reports that he quit smoking about 49 years ago. His smoking use included cigarettes. He has a 28.00 pack-year smoking history. He has never used smokeless tobacco. He reports that he does not drink alcohol and does not use drugs.   Family History:  The patient's family history includes Diabetes in his brother, daughter, father, mother, sister, and another family member; Heart Problems in his daughter; Heart attack in his father and mother; Heart failure in his father; Hypertension in his brother, father, mother,  and sister; Stroke in his brother.    ROS:  Please see the history of present illness.   Otherwise, review of systems are positive for none.   All other systems are reviewed and negative.    PHYSICAL EXAM: VS:  BP 128/68   Ht _0  (1.803 m)   Wt 236 lb 12.8 oz (107.4 kg)   SpO2 98%   BMI 33.03 kg/m  , BMI Body mass index is 33.03 kg/m. GEN: Well nourished, well developed, in no acute distress  HEENT: normal  Neck: no JVD, carotid bruits, or masses Cardiac: Irregularly irregular; no  rubs, or gallops,no edema . There is a 3/6 mid-late peaking aortic stenosis murmur Respiratory:  clear to auscultation bilaterally, normal work of breathing GI: soft, nontender, nondistended, + BS MS: no deformity or atrophy  Skin: warm and dry, no rash Neuro:  Strength and sensation are intact Psych: euthymic mood, full affect Vascular: Dorsalis pedis is faint on the right.  Posterior tibial is not palpable.  EKG:  EKG is ordered today. EKG showed ventricular paced rhythm.   Recent Labs: 02/21/2022: ALT 15; BUN 32; Creatinine, Ser 1.70; Potassium 4.1; Sodium 138; TSH 0.51    Lipid Panel    Component Value Date/Time   CHOL 120 02/21/2022 1438   CHOL 178 08/02/2021 1038   TRIG 105.0 02/21/2022 1438   HDL 29.50 (L) 02/21/2022 1438   HDL 31 (L) 08/02/2021 1038   CHOLHDL 4  02/21/2022 1438   VLDL 21.0 02/21/2022 1438   LDLCALC 69 02/21/2022 1438   LDLCALC 118 (H) 08/02/2021 1038   LDLCALC 142 (H) 05/31/2019 1246   LDLDIRECT 123.0 12/03/2019 1136      Wt Readings from Last 3 Encounters:  08/06/22 236 lb 12.8 oz (107.4 kg)  05/30/22 236 lb (107 kg)  04/17/22 238 lb (108 kg)        ASSESSMENT AND PLAN:  1.  Peripheral arterial disease: Status post left BKA. Status post orbital atherectomy and drug-coated balloon angioplasty to the right SFA and popliteal arteries for critical limb ischemia with subsequent resolution of ulceration.  Currently with no claudication.   Duplex in April showed patent SFA and popliteal arteries with stable moderately elevated velocities.  2. Atrial fibrillation: He is currently on anticoagulation with Eliquis.  3. Hyperlipidemia: I reviewed most recent lipid profile done in April which showed an LDL of 69.  Continue atorvastatin and ezetimibe.  4. Coronary artery disease involving native coronary arteries without angina or medical therapy.  5.  Status post TAVR: Seems to be stable.   Disposition:   FU with me in 6 months.  Signed,  [name], MD  08/06/2022 8:42 AM    [place]

## 2022-08-06 NOTE — Patient Instructions (Addendum)
Medication Instructions:  Your physician recommends that you continue on your current medications as directed. Please refer to the Current Medication list given to you today.  *If you need a refill on your cardiac medications before your next appointment, please call your pharmacy*   Lab Work: None If you have labs (blood work) drawn today and your tests are completely normal, you will receive your results only by: [place] (if you have MyChart) OR A paper copy in the mail If you have any lab test that is abnormal or we need to change your treatment, we will call you to review the results.   Testing/Procedures: None   Follow-Up: At [facility], you and your health needs are our priority.  As part of our continuing mission to provide you with exceptional heart care, we have created designated Provider Care Teams.  These Care Teams include your primary Cardiologist (physician) and Advanced Practice Providers (APPs -  Physician Assistants and Nurse Practitioners) who all work together to provide you with the care you need, when you need it.  We recommend signing up for the patient portal called "MyChart".  Sign up information is provided on this After Visit Summary.  MyChart is used to connect with patients for Virtual Visits (Telemedicine).  Patients are able to view lab/test results, encounter notes, upcoming appointments, etc.  Non-urgent messages can be sent to your provider as well.   To learn more about what you can do with MyChart, go to [email or web address].    Your next appointment:   6 month(s)  The format for your next appointment:   In Person  Provider:   [name], MD  Other Instructions   Important Information About Sugar

## 2022-08-21 ENCOUNTER — Encounter (INDEPENDENT_AMBULATORY_CARE_PROVIDER_SITE_OTHER): Payer: Medicare Other | Admitting: Ophthalmology

## 2022-08-21 DIAGNOSIS — H35033 Hypertensive retinopathy, bilateral: Secondary | ICD-10-CM

## 2022-08-21 DIAGNOSIS — E113393 Type 2 diabetes mellitus with moderate nonproliferative diabetic retinopathy without macular edema, bilateral: Secondary | ICD-10-CM | POA: Diagnosis not present

## 2022-08-21 DIAGNOSIS — H43813 Vitreous degeneration, bilateral: Secondary | ICD-10-CM | POA: Diagnosis not present

## 2022-08-21 DIAGNOSIS — I10 Essential (primary) hypertension: Secondary | ICD-10-CM

## 2022-08-27 ENCOUNTER — Other Ambulatory Visit (INDEPENDENT_AMBULATORY_CARE_PROVIDER_SITE_OTHER): Payer: Medicare Other

## 2022-08-27 DIAGNOSIS — E1165 Type 2 diabetes mellitus with hyperglycemia: Secondary | ICD-10-CM

## 2022-08-27 DIAGNOSIS — E039 Hypothyroidism, unspecified: Secondary | ICD-10-CM | POA: Diagnosis not present

## 2022-08-27 DIAGNOSIS — Z794 Long term (current) use of insulin: Secondary | ICD-10-CM | POA: Diagnosis not present

## 2022-08-27 LAB — BASIC METABOLIC PANEL
BUN: 28 mg/dL — ABNORMAL HIGH (ref 6–23)
CO2: 26 mEq/L (ref 19–32)
Calcium: 10 mg/dL (ref 8.4–10.5)
Chloride: 103 mEq/L (ref 96–112)
Creatinine, Ser: 1.82 mg/dL — ABNORMAL HIGH (ref 0.40–1.50)
GFR: 35.04 mL/min — ABNORMAL LOW (ref >60.00)
Glucose, Bld: 164 mg/dL — ABNORMAL HIGH (ref 70–99)
Potassium: 4.1 mEq/L (ref 3.5–5.1)
Sodium: 139 mEq/L (ref 135–145)

## 2022-08-27 LAB — HEMOGLOBIN A1C: Hgb A1c MFr Bld: 9.6 % — ABNORMAL HIGH (ref 4.6–6.5)

## 2022-08-27 LAB — MICROALBUMIN / CREATININE URINE RATIO
Creatinine,U: 213 mg/dL
Microalb Creat Ratio: 2.9 mg/g (ref 0.0–30.0)
Microalb, Ur: 6.2 mg/dL — ABNORMAL HIGH (ref 0.0–1.9)

## 2022-08-27 LAB — T4, FREE: Free T4: 0.92 ng/dL (ref 0.60–1.60)

## 2022-08-27 LAB — TSH: TSH: 2.74 u[IU]/mL (ref 0.35–5.50)

## 2022-08-28 DIAGNOSIS — H40013 Open angle with borderline findings, low risk, bilateral: Secondary | ICD-10-CM | POA: Diagnosis not present

## 2022-08-28 DIAGNOSIS — E113392 Type 2 diabetes mellitus with moderate nonproliferative diabetic retinopathy without macular edema, left eye: Secondary | ICD-10-CM | POA: Diagnosis not present

## 2022-08-28 DIAGNOSIS — H40032 Anatomical narrow angle, left eye: Secondary | ICD-10-CM | POA: Diagnosis not present

## 2022-08-28 DIAGNOSIS — E113311 Type 2 diabetes mellitus with moderate nonproliferative diabetic retinopathy with macular edema, right eye: Secondary | ICD-10-CM | POA: Diagnosis not present

## 2022-08-29 ENCOUNTER — Encounter: Payer: Medicare Other | Admitting: Endocrinology

## 2022-08-29 ENCOUNTER — Encounter: Payer: Self-pay | Admitting: Endocrinology

## 2022-08-30 NOTE — Progress Notes (Signed)
This encounter was created in error - please disregard.

## 2022-09-16 DIAGNOSIS — H6123 Impacted cerumen, bilateral: Secondary | ICD-10-CM | POA: Diagnosis not present

## 2022-09-18 ENCOUNTER — Other Ambulatory Visit: Payer: Self-pay

## 2022-09-18 ENCOUNTER — Ambulatory Visit: Payer: Self-pay

## 2022-09-18 DIAGNOSIS — E1165 Type 2 diabetes mellitus with hyperglycemia: Secondary | ICD-10-CM

## 2022-09-18 MED ORDER — OZEMPIC (0.25 OR 0.5 MG/DOSE) 2 MG/1.5ML ~~LOC~~ SOPN: PEN_INJECTOR | SUBCUTANEOUS | 1 refills | Status: AC

## 2022-09-18 NOTE — Patient Outreach (Signed)
  Care Coordination   Reschedule  Visit Note   09/18/2022 Name: Steven Maldonado MRN: [id number] DOB: September 29, 1941  Steven Maldonado is a 81 y.o. year old male who sees Steven Maldonado, [place] for primary care. I  spoke with Steven Maldonado  What matters to the patients health and wellness today?  They are [place] and would like to reschedule the call.    Goals Addressed   None     SDOH assessments and interventions completed:  No     Care Coordination Interventions Activated:  No  Care Coordination Interventions:  No, not indicated   Follow up plan: Follow up call scheduled for Tuesday 09/24/22 The patient will call me.    Encounter Outcome:  Pt. Visit Completed   Lazaro Arms RN, BSN, [facility]   Phone: [phone]

## 2022-09-23 ENCOUNTER — Ambulatory Visit (INDEPENDENT_AMBULATORY_CARE_PROVIDER_SITE_OTHER): Payer: Medicare Other

## 2022-09-23 DIAGNOSIS — I442 Atrioventricular block, complete: Secondary | ICD-10-CM

## 2022-09-27 LAB — CUP PACEART REMOTE DEVICE CHECK
Battery Remaining Longevity: 62 mo
Battery Voltage: 2.9 V
Brady Statistic AP VP Percent: 9.2 %
Brady Statistic AP VS Percent: 0.03 %
Brady Statistic AS VP Percent: 88.65 %
Brady Statistic AS VS Percent: 2.13 %
Brady Statistic RA Percent Paced: 10.05 %
Brady Statistic RV Percent Paced: 97.85 %
Date Time Interrogation Session: 20231116234314
Implantable Lead Connection Status: 753985
Implantable Lead Connection Status: 753985
Implantable Lead Implant Date: 20190124
Implantable Lead Implant Date: 20190124
Implantable Lead Location: 753859
Implantable Lead Location: 753860
Implantable Lead Model: 5076
Implantable Lead Model: 5076
Implantable Pulse Generator Implant Date: 20190124
Lead Channel Impedance Value: 323 Ohm
Lead Channel Impedance Value: 361 Ohm
Lead Channel Impedance Value: 361 Ohm
Lead Channel Impedance Value: 437 Ohm
Lead Channel Pacing Threshold Amplitude: 0.625 V
Lead Channel Pacing Threshold Amplitude: 0.75 V
Lead Channel Pacing Threshold Pulse Width: 0.4 ms
Lead Channel Pacing Threshold Pulse Width: 0.4 ms
Lead Channel Sensing Intrinsic Amplitude: 20.125 mV
Lead Channel Sensing Intrinsic Amplitude: 20.125 mV
Lead Channel Sensing Intrinsic Amplitude: 3.125 mV
Lead Channel Sensing Intrinsic Amplitude: 3.125 mV
Lead Channel Setting Pacing Amplitude: 2 V
Lead Channel Setting Pacing Amplitude: 2.5 V
Lead Channel Setting Pacing Pulse Width: 0.4 ms
Lead Channel Setting Sensing Sensitivity: 4 mV
Zone Setting Status: 755011

## 2022-10-07 DIAGNOSIS — N1831 Chronic kidney disease, stage 3a: Secondary | ICD-10-CM | POA: Diagnosis not present

## 2022-10-09 ENCOUNTER — Ambulatory Visit (INDEPENDENT_AMBULATORY_CARE_PROVIDER_SITE_OTHER): Payer: Medicare Other | Admitting: Endocrinology

## 2022-10-09 ENCOUNTER — Encounter: Payer: Self-pay | Admitting: Endocrinology

## 2022-10-09 VITALS — BP 124/70 | HR 57 | Ht 71.0 in | Wt 237.0 lb

## 2022-10-09 DIAGNOSIS — I2510 Atherosclerotic heart disease of native coronary artery without angina pectoris: Secondary | ICD-10-CM | POA: Diagnosis not present

## 2022-10-09 DIAGNOSIS — Z794 Long term (current) use of insulin: Secondary | ICD-10-CM | POA: Diagnosis not present

## 2022-10-09 DIAGNOSIS — E039 Hypothyroidism, unspecified: Secondary | ICD-10-CM | POA: Diagnosis not present

## 2022-10-09 DIAGNOSIS — E1165 Type 2 diabetes mellitus with hyperglycemia: Secondary | ICD-10-CM

## 2022-10-09 NOTE — Patient Instructions (Signed)
Must take Humalog at start of each meal, not during nite  Call supplier to get [place] [employer]ap[employer] 3 system  Check blood sugars on waking up  Also check blood sugars about 2 hours after meals and do this after different meals by rotation  Recommended blood sugar levels on waking up are 90-130 and about 2 hours after meal is 130-160  Please bring your blood sugar monitor to each visit, thank you

## 2022-10-09 NOTE — Progress Notes (Unsigned)
Patient ID: Steven Maldonado, male   DOB: 02/22/1941, 81 y.o.   MRN: [id number]          Reason for Appointment: Endocrinology follow-up     History of Present Illness:     DIABETES:       Date of diagnosis of type 2 diabetes mellitus:  1999      Background history:   He had been treated with metformin and glipizide for several years Metformin was stopped about 4 years ago because of renal dysfunction Presumably because of poor control he was started on insulin around the year 2009 He had been mostly on Lantus which was subsequently changed to [employer] and he thinks that Lantus worked better Also has been on Humalog 3-4 times a day for some time  Recent history:    INSULIN regimen is:  TRESIBA 16 units at bedtime, Humalog 8-9 units ac bid  Non-insulin hypoglycemic drugs the patient is taking are: on Ozempic 0.5 mg weekly, glipizide ER 5 mg daily  Most recent A1c is 9.6  Not been seen since April  Current management, blood sugar patterns and problems identified:  He appears to have erratic regimen off taking his Humalog.  Judging by his blood sugar patterns he is likely not taking the insulin before eating because of fairly consistently high readings after meals but not overnight He is generally taking the Humalog insulin when his blood sugars start going up and occasionally during the night when the blood sugar is high Occasionally may have low normal readings before dinner are early morning from late administration of his insulin However he thinks he is taking Ozempic and 16 units of Tresiba regularly He says that he has had some stress and likely not consistent with diet  Again his blood sugar patterns are difficult to assess as most of his monitoring is only in the morning  Generally has 2 meals a day at about 9 AM and 4 PM He thinks he is taking his Ozempic regularly every Sunday     Side effects from medications have been: None     Meal times are:  Breakfast is at 9  AM, dinnertime 4-5 PM  Freestyle libre data:   Active CGM time is 40% AVERAGE blood sugar early morning is 137 and highest appears to be 212 early afternoon   Previously:  CGM use % of time 39  2-week average/GV 158/35  Time in range 57  % Time Above 180 29+6  % Time above 250   % Time Below 70 8     PRE-MEAL Fasting Lunch Dinner Bedtime Overall  Glucose range:       Averages: 146 148  221 158   POST-MEAL PC Breakfast PC Lunch PC Dinner  Glucose range:     Averages: 409 811 914     Dietician visit, most recent: 9/19  Weight history:  Wt Readings from Last 3 Encounters:  10/09/22 237 lb (107.5 kg)  08/29/22 233 lb 9.6 oz (106 kg)  08/06/22 236 lb 12.8 oz (107.4 kg)    Glycemic control:   Lab Results  Component Value Date   HGBA1C 9.6 (H) 08/27/2022   HGBA1C 8.3 (A) 01/10/2022   HGBA1C 8.4 (H) 09/28/2021   Lab Results  Component Value Date   MICROALBUR 6.2 (H) 08/27/2022   LDLCALC 69 02/21/2022   CREATININE 1.82 (H) 08/27/2022   Lab Results  Component Value Date   MICRALBCREAT 2.9 08/27/2022    Lab Results  Component Value  Date   FRUCTOSAMINE 268 06/26/2018   HYPOTHYROIDISM and other problems: See review of systems   No visits with results within 1 Week(s) from this visit.  Latest known visit with results is:  Clinical Support on 09/23/2022  Component Date Value Ref Range Status   Date Time Interrogation Session 09/26/2022 [id number]   Final   Pulse Generator Manufacturer 09/26/2022 MERM   Final   Pulse Gen Model 09/26/2022 [id number] Azure XT DR MRI   Final   Pulse Gen Serial Number 09/26/2022 [id number]   Final   Clinic Name 09/26/2022 [facility]   Final   Implantable Pulse Generator Type 09/26/2022 Implantable Pulse Generator   Final   Implantable Pulse Generator Implan* 09/26/2022 [id number]   Final   Implantable Lead Manufacturer 09/26/2022 MERM   Final   Implantable Lead Model 09/26/2022 5076 CapSureFix Novus MRI SureScan   Final    Implantable Lead Serial Number 09/26/2022 [id number]   Final   Implantable Lead Implant Date 09/26/2022 [id number]   Final   Implantable Lead Location Detail 1 09/26/2022 APPENDAGE   Final   Implantable Lead Location 09/26/2022 [id number]   Final   Implantable Lead Connection Status 09/26/2022 [id number]   Final   Implantable Lead Manufacturer 09/26/2022 MERM   Final   Implantable Lead Model 09/26/2022 5076 CapSureFix Novus MRI SureScan   Final   Implantable Lead Serial Number 09/26/2022 [id number]   Final   Implantable Lead Implant Date 09/26/2022 [id number]   Final   Implantable Lead Location Detail 1 09/26/2022 SEPTUM   Final   Implantable Lead Location 09/26/2022 [id number]   Final   Implantable Lead Connection Status 09/26/2022 [id number]   Final   Lead Channel Setting Sensing Sensi* 09/26/2022 4  mV Final   Lead Channel Setting Pacing Amplit* 09/26/2022 2  V Final   Lead Channel Setting Pacing Pulse * 09/26/2022 0.4  ms Final   Lead Channel Setting Pacing Amplit* 09/26/2022 2.5  V Final   Zone Setting Status 09/26/2022 [id number]   Final   Zone Setting Status 09/26/2022 Inactive   Final   Zone Setting Status 09/26/2022 Active   Final   Lead Channel Impedance Value 09/26/2022 361  ohm Final   Lead Channel Impedance Value 09/26/2022 323  ohm Final   Lead Channel Sensing Intrinsic Amp* 09/26/2022 3.125  mV Final   Lead Channel Sensing Intrinsic Amp* 09/26/2022 3.125  mV Final   Lead Channel Pacing Threshold Ampl* 09/26/2022 0.625  V Final   Lead Channel Pacing Threshold Puls* 09/26/2022 0.4  ms Final   Lead Channel Impedance Value 09/26/2022 437  ohm Final   Lead Channel Impedance Value 09/26/2022 361  ohm Final   Lead Channel Sensing Intrinsic Amp* 09/26/2022 20.125  mV Final   Lead Channel Sensing Intrinsic Amp* 09/26/2022 20.125  mV Final   Lead Channel Pacing Threshold Ampl* 09/26/2022 0.75  V Final   Lead Channel Pacing Threshold Puls* 09/26/2022 0.4  ms Final   Battery Status 09/26/2022 OK   Final    Battery Remaining Longevity 09/26/2022 62  mo Final   Battery Voltage 09/26/2022 2.90  V Final   Brady Statistic RA Percent Paced 09/26/2022 10.05  % Final   Brady Statistic RV Percent Paced 09/26/2022 97.85  % Final   Brady Statistic AP VP Percent 09/26/2022 9.2  % Final   Brady Statistic AS VP Percent 09/26/2022 88.65  % Final   Brady Statistic AP VS Percent 09/26/2022 0.03  % Final   Brady Statistic AS VS  Percent 09/26/2022 2.13  % Final    Allergies as of 10/09/2022   No Known Allergies      Medication List        Accurate as of October 09, 2022 11:32 AM. If you have any questions, ask your nurse or doctor.          amoxicillin-clavulanate 875-125 MG tablet Commonly known as: AUGMENTIN Take 1 tablet by mouth 2 (two) times daily.   apixaban 5 MG Tabs tablet Commonly known as: ELIQUIS Take 1 tablet (5 mg total) by mouth 2 (two) times daily.   atorvastatin 40 MG tablet Commonly known as: LIPITOR Take 1 tablet (40 mg total) by mouth daily.   BD Pen Needle Nano 2nd Gen 32G X 4 MM Misc Generic drug: Insulin Pen Needle USE 4 (FOUR) TIMES DAILY.   diclofenac Sodium 1 % Gel Commonly known as: VOLTAREN Apply 4 g topically 4 (four) times daily.   docusate sodium 100 MG capsule Commonly known as: COLACE Take 300 mg by mouth daily.   ezetimibe 10 MG tablet Commonly known as: ZETIA TAKE 1 TABLET DAILY   Fenofibric Acid 135 MG Cpdr TAKE 1 CAPSULE DAILY   freestyle lancets Use to check blood sugar 4 times a day   FreeStyle Lite Devi 1 each by Does not apply route 2 (two) times daily.   FreeStyle Lite w/Device Kit Use to check blood sugar 4 times a day   FREESTYLE LITE test strip Generic drug: glucose blood 1 EACH BY OTHER ROUTE 4 (FOUR) TIMES DAILY - BEFORE MEALS AND AT BEDTIME.   furosemide 20 MG tablet Commonly known as: LASIX Take 1 tablet (20 mg total) by mouth every other day.   glipiZIDE 5 MG 24 hr tablet Commonly known as: GLUCOTROL XL Take 1  tablet (5 mg total) by mouth daily with breakfast.   HumaLOG KwikPen 100 UNIT/ML KwikPen Generic drug: insulin lispro INJECT 5-8 UNITS SUBCUTANEOUSLY UP TO 3 TIMES A DAY IF EATING SWEETS OR HIGH CARBOHYDRATE FOODS   levothyroxine 200 MCG tablet Commonly known as: SYNTHROID TAKE 1 TABLET BY MOUTH DAILY MONDAY-SATURDAY  TAKE 1/2 TABLET ON SUNDAY   metoprolol tartrate 50 MG tablet Commonly known as: LOPRESSOR TAKE ONE AND ONE-HALF TABLET BY MOUTH TWICE A DAY   mupirocin ointment 2 % Commonly known as: BACTROBAN Apply 1 Application topically 2 (two) times daily.   Myrbetriq 25 MG Tb24 tablet Generic drug: mirabegron ER TAKE 1 TABLET DAILY What changed: how much to take   Ozempic (0.25 or 0.5 MG/DOSE) 2 MG/1.5ML Sopn Generic drug: Semaglutide(0.25 or 0.5MG/DOS) INJECT 0.5MG SUBCUTANEOUSLYONCE A WEEK   Tresiba FlexTouch 200 UNIT/ML FlexTouch Pen Generic drug: insulin degludec INJECT SUBCUTANEOUSLY 26   UNITS AT BEDTIME        Allergies: No Known Allergies  Past Medical History:  Diagnosis Date   Chronic diastolic CHF (congestive heart failure) (HCC)    CKD (chronic kidney disease), stage III (HCC)    Constipation    Coronary artery disease    a. Cath February 2012 in [place] [facility], occluded RCA with collaterals   DM type 2 (diabetes mellitus, type 2) ([place])    Essential hypertension    Hyperlipidemia    Neuropathy    feet   Pacemaker    a. symptomatic brady after TAVR s/p MDT PPM by Dr. Curt Bears 12/04/17   Persistent atrial fibrillation (HCC)    PONV (postoperative nausea and vomiting)    after valve surgery   PVD (peripheral vascular disease) (  [place])    a. s/p R popliteal artery stenosis tx with drug-coated balloon 05/2014, followed by Dr. Fletcher Anon.   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 12/02/17: s/p TAVR   Skin cancer     Sleep apnea with use of continuous positive airway pressure (CPAP)    04-11-11 AHI was 32.9 and titrated to 15 cm H20, DME is [facility]   Subclinical hypothyroidism     Past Surgical History:  Procedure Laterality Date   ABDOMINAL ANGIOGRAM N/A 06/08/2014   Procedure: ABDOMINAL ANGIOGRAM;  Surgeon: Wellington Hampshire, MD;  Location: [facility];  Service: Cardiovascular;  Laterality: N/A;   ABDOMINAL AORTOGRAM N/A 04/09/2018   Procedure: ABDOMINAL AORTOGRAM;  Surgeon: Conrad Ballou, MD;  Location: [facility];  Service: Cardiovascular;  Laterality: N/A;   ABDOMINAL AORTOGRAM W/LOWER EXTREMITY N/A 08/23/2020   Procedure: ABDOMINAL AORTOGRAM W/LOWER EXTREMITY;  Surgeon: Wellington Hampshire, MD;  Location: [facility];  Service: Cardiovascular;  Laterality: N/A;   AMPUTATION Left 04/17/2018   Procedure: LEFT FOOT 3RD RAY AMPUTATION;  Surgeon: Newt Minion, MD;  Location: [facility];  Service: Orthopedics;  Laterality: Left;   AMPUTATION Left 05/28/2018   Procedure: LEFT AMPUTATION BELOW KNEE;  Surgeon: Wylene Simmer, MD;  Location: [facility];  Service: Orthopedics;  Laterality: Left;   [place] N/A 11/08/2020   Procedure: ATRIAL FIBRILLATION ABLATION;  Surgeon: Constance Haw, MD;  Location: [facility];  Service: Cardiovascular;  Laterality: N/A;   BELOW KNEE LEG AMPUTATION Left 05/28/2018   CARDIAC CATHETERIZATION  12/2010   CARDIOVERSION  07/2011   CARDIOVERSION N/A 04/18/2014   Procedure: CARDIOVERSION;  Surgeon: Dorothy Spark, MD;  Location: [facility];  Service: Cardiovascular;  Laterality: N/A;   CARDIOVERSION N/A 11/03/2015   Procedure: CARDIOVERSION;  Surgeon: Lelon Perla, MD;  Location: [facility] END[facility];  Service: Cardiovascular;  Laterality: N/A;   CARDIOVERSION N/A 05/08/2017   Procedure: CARDIOVERSION;  Surgeon: Dorothy Spark, MD;  Location: [place];  Service: Cardiovascular;  Laterality: N/A;   CARDIOVERSION N/A  07/28/2017   Procedure: CARDIOVERSION;  Surgeon: Dorothy Spark, MD;  Location: [facility] END[facility];  Service: Cardiovascular;  Laterality: N/A;   CARDIOVERSION N/A 02/08/2020   Procedure: CARDIOVERSION;  Surgeon: Buford Dresser, MD;  Location: [facility] ENDOSCOPY;  Service: Cardiovascular;  Laterality: N/A;   CARDIOVERSION N/A 03/15/2020   Procedure: CARDIOVERSION;  Surgeon: Acie Fredrickson Wonda Cheng, MD;  Location: [facility] ENDOSCOPY;  Service: Cardiovascular;  Laterality: N/A;   CARDIOVERSION N/A 09/20/2020   Procedure: CARDIOVERSION;  Surgeon: Werner Lean, MD;  Location: [place];  Service: Cardiovascular;  Laterality: N/A;   LOWER EXTREMITY ANGIOGRAM N/A 06/08/2014   Procedure: LOWER EXTREMITY ANGIOGRAM;  Surgeon: Wellington Hampshire, MD;  Location: [facility];  Service: Cardiovascular;  Laterality: N/A;   LOWER EXTREMITY ANGIOGRAPHY Left 04/09/2018   Procedure: Lower Extremity Angiography;  Surgeon: Conrad Kismet, MD;  Location: [place];  Service: Cardiovascular;  Laterality: Left;   PACEMAKER IMPLANT N/A 12/04/2017   Procedure: PACEMAKER IMPLANT;  Surgeon: Constance Haw, MD;  Location: [facility];  Service: Cardiovascular;  Laterality: N/A;   PERIPHERAL VASCULAR ATHERECTOMY  08/23/2020   Procedure: PERIPHERAL VASCULAR ATHERECTOMY;  Surgeon: Wellington Hampshire, MD;  Location: [facility];  Service: Cardiovascular;;  PERIPHERAL VASCULAR BALLOON ANGIOPLASTY Left 04/09/2018   Procedure: PERIPHERAL VASCULAR BALLOON ANGIOPLASTY;  Surgeon: Conrad McCurtain, MD;  Location: [place] [facility];  Service: Cardiovascular;  Laterality: Left;  SFA   POPLITEAL ARTERY ANGIOPLASTY Right 06/08/2014   Archie Endo 06/08/2014   RIGHT/LEFT HEART CATH AND CORONARY ANGIOGRAPHY N/A 10/08/2017   Procedure: RIGHT/LEFT HEART CATH AND CORONARY ANGIOGRAPHY;  Surgeon: Burnell Blanks, MD;  Location: [facility] [employer] [facility];  Service: Cardiovascular;  Laterality: N/A;   SKIN CANCER EXCISION Bilateral     "have had them cut off back of neck X 2; off left upper arm; right wrist, near right shoulder blade" (06/08/2014)   TEE WITHOUT CARDIOVERSION N/A 12/02/2017   Procedure: TRANSESOPHAGEAL ECHOCARDIOGRAM (TEE);  Surgeon: Burnell Blanks, MD;  Location: [facility];  Service: Open Heart Surgery;  Laterality: N/A;   TEMPORARY PACEMAKER N/A 12/04/2017   Procedure: TEMPORARY PACEMAKER;  Surgeon: Leonie Man, MD;  Location: [facility];  Service: Cardiovascular;  Laterality: N/A;   TRANSCATHETER AORTIC VALVE REPLACEMENT, TRANSFEMORAL N/A 12/02/2017   Procedure: TRANSCATHETER AORTIC VALVE REPLACEMENT, TRANSFEMORAL;  Surgeon: Burnell Blanks, MD;  Location: [facility];  Service: Open Heart Surgery;  Laterality: N/A;  using Edwards Sapien 3 Transcatheter Heart Valve size 76m    Family History  Problem Relation Age of Onset   Diabetes Mother    Heart attack Mother    Hypertension Mother    Heart attack Father    Heart failure Father    Hypertension Father    Diabetes Father    Diabetes Sister    Diabetes Brother    Diabetes Other    Diabetes Daughter        TYPE ll   Heart Problems Daughter    Hypertension Sister    Hypertension Brother    Stroke Brother     Social History:  reports that he quit smoking about 49 years ago. His smoking use included cigarettes. He has a 28.00 pack-year smoking history. He has never used smokeless tobacco. He reports that he does not drink alcohol and does not use drugs.   Review of Systems   Lipid history: He is supposed to be on 40  mg atorvastatin and ezetimibe.  Lipids were fairly good on the last visit  He has history of CAD    Lab Results  Component Value Date   CHOL 120 02/21/2022   CHOL 178 08/02/2021   CHOL 158 03/01/2021   Lab Results  Component Value Date   HDL 29.50 (L) 02/21/2022   HDL 31 (L) 08/02/2021   HDL 35.80 (L) 03/01/2021   Lab Results  Component Value Date   LDLCALC 69 02/21/2022   LDLCALC 118 (H) 08/02/2021    LDLCALC 94 03/01/2021   Lab Results  Component Value Date   TRIG 105.0 02/21/2022   TRIG 164 (H) 08/02/2021   TRIG 142.0 03/01/2021   Lab Results  Component Value Date   CHOLHDL 4 02/21/2022   CHOLHDL 5.7 (H) 08/02/2021   CHOLHDL 4 03/01/2021   Lab Results  Component Value Date   LDLDIRECT 123.0 12/03/2019   LDLDIRECT 77.0 08/18/2018            Hypertension: Has been controlled with only antihypertensive being metoprolol, not on ACE inhibitor  BP Readings from Last 3 Encounters:  10/09/22 124/70  08/06/22 128/68  05/30/22 99/62    Most recent eye exam was in 8/22  Most recent foot exam: 8/22   HYPOTHYROIDISM:   TSH  is now consistently normal with the 200 mcg levothyroxine, taking this every day now No complaints of fatigue  Lab Results  Component Value Date   TSH 2.74 08/27/2022   TSH 0.51 02/21/2022   TSH 3.540 08/02/2021   FREET4 0.92 08/27/2022   FREET4 1.42 02/21/2022   FREET4 1.22 08/02/2021    Peripheral vascular disease, 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 History of symptomatic neuropathy  He has chronic kidney disease of unclear etiology, is followed by nephrologist   Lab Results  Component Value Date   CREATININE 1.82 (H) 08/27/2022   CREATININE 1.70 (H) 02/21/2022   CREATININE 1.69 (H) 09/28/2021       Physical Examination:  BP 124/70   Pulse (!) 57   Ht _0  (1.803 m)   Wt 237 lb (107.5 kg)   SpO2 99%   BMI 33.05 kg/m          ASSESSMENT:  Diabetes type 2, insulin requiring with moderate obesity  See history of present illness for detailed discussion of current diabetes management, blood sugar patterns and problems identified  Currently on a regimen of basal bolus insulin with Ozempic 0.5 mg  A1c is 9.6  His CGM indicates high postprandial readings likely from forgetting to take his Humalog before meals Day-to-day management was discussed in detail as above Again reminded him of the action  of mealtime insulin and timing of taking it before the meal regardless of [place] blood sugar  Hypothyroidism: Needs to continue 200 mcg levothyroxine as this appears to be adequate and to take it regularly every morning before eating    PLAN:    He will call his CGM supplier and try to get the [place] 3 since he has used the [place] 2 for over 3 years Paperwork will be filled out once it is received Meanwhile check blood sugars consistently before meals and at bedtime Take Humalog before starting to eat consistently No change in [place] or Ozempic  There are no Patient Instructions on file for this visit.   Total visit time including counseling 30 Minutes  Elayne Snare 10/09/2022, 11:32 AM   Note: This office note was prepared with Dragon voice recognition system technology. Any transcriptional errors that result from this process are unintentional.    Elayne Snare

## 2022-10-16 DIAGNOSIS — I2510 Atherosclerotic heart disease of native coronary artery without angina pectoris: Secondary | ICD-10-CM | POA: Diagnosis not present

## 2022-10-16 DIAGNOSIS — I350 Nonrheumatic aortic (valve) stenosis: Secondary | ICD-10-CM | POA: Diagnosis not present

## 2022-10-16 DIAGNOSIS — I5032 Chronic diastolic (congestive) heart failure: Secondary | ICD-10-CM | POA: Diagnosis not present

## 2022-10-16 DIAGNOSIS — N1832 Chronic kidney disease, stage 3b: Secondary | ICD-10-CM | POA: Diagnosis not present

## 2022-10-16 DIAGNOSIS — E1122 Type 2 diabetes mellitus with diabetic chronic kidney disease: Secondary | ICD-10-CM | POA: Diagnosis not present

## 2022-10-16 DIAGNOSIS — E559 Vitamin D deficiency, unspecified: Secondary | ICD-10-CM | POA: Diagnosis not present

## 2022-10-16 DIAGNOSIS — I129 Hypertensive chronic kidney disease with stage 1 through stage 4 chronic kidney disease, or unspecified chronic kidney disease: Secondary | ICD-10-CM | POA: Diagnosis not present

## 2022-10-17 ENCOUNTER — Telehealth: Payer: Self-pay

## 2022-10-17 MED ORDER — FREESTYLE LIBRE 3 SENSOR MISC: 1 refills | Status: AC

## 2022-10-17 NOTE — Telephone Encounter (Signed)
Wife called in about [place] 3. States Rx needs to be sent to [facility] [phone]. I have printed Rx to be signed.

## 2022-10-24 ENCOUNTER — Ambulatory Visit (INDEPENDENT_AMBULATORY_CARE_PROVIDER_SITE_OTHER): Payer: Medicare Other | Admitting: Podiatry

## 2022-10-24 DIAGNOSIS — M79674 Pain in right toe(s): Secondary | ICD-10-CM | POA: Diagnosis not present

## 2022-10-24 DIAGNOSIS — I739 Peripheral vascular disease, unspecified: Secondary | ICD-10-CM | POA: Diagnosis not present

## 2022-10-24 DIAGNOSIS — E1142 Type 2 diabetes mellitus with diabetic polyneuropathy: Secondary | ICD-10-CM

## 2022-10-24 DIAGNOSIS — E119 Type 2 diabetes mellitus without complications: Secondary | ICD-10-CM

## 2022-10-24 DIAGNOSIS — B351 Tinea unguium: Secondary | ICD-10-CM | POA: Diagnosis not present

## 2022-10-24 DIAGNOSIS — E11649 Type 2 diabetes mellitus with hypoglycemia without coma: Secondary | ICD-10-CM

## 2022-10-24 NOTE — Progress Notes (Signed)
  Subjective:  Patient ID: Steven Maldonado, male    DOB: 1941-02-13,  MRN: [id number]  Chief Complaint  Patient presents with   Nail Problem    Thick painful toenails, 3 month follow up    81 y.o. male presents with the above complaint. History confirmed with patient.  He is doing well the nails are thick and elongated otherwise no new issues  Objective:  Physical Exam: warm, good capillary refill, no trophic changes or ulcerative lesions, weakly palpable DP and PT pulses, and normal sensory exam.  Right Foot: dystrophic yellowed discolored nail plates with subungual debris  Assessment:   1. Encounter for diabetic foot exam ([place])   2. Pain due to onychomycosis of toenail of right foot   3. PAD (peripheral artery disease) ([place])   4. Uncontrolled type 2 diabetes mellitus with hypoglycemia without coma ([place])   5. Diabetic peripheral neuropathy associated with type 2 diabetes mellitus ([place])       Plan:  Patient was evaluated and treated and all questions answered.  Patient educated on diabetes. Discussed proper diabetic foot care and discussed risks and complications of disease. Educated patient in depth on reasons to return to the office immediately should he/she discover anything concerning or new on the feet. All questions answered. Discussed proper shoes as well.  Annual at risk diabetic foot exam performed today.  He remains high risk of complications amputation and ulceration due to his PAD and uncontrolled diabetes.  Discussed the etiology and treatment options for the condition in detail with the patient. Educated patient on the topical and oral treatment options for mycotic nails. Recommended debridement of the nails today. Sharp and mechanical debridement performed of all painful and mycotic nails today. Nails debrided in length and thickness using a nail nipper to level of comfort. Discussed treatment options including appropriate shoe gear. Follow up as needed for painful  nails.     Return in about 3 months (around 01/23/2023) for at risk diabetic foot care.

## 2022-10-28 DIAGNOSIS — R739 Hyperglycemia, unspecified: Secondary | ICD-10-CM | POA: Diagnosis not present

## 2022-10-28 DIAGNOSIS — I469 Cardiac arrest, cause unspecified: Secondary | ICD-10-CM | POA: Diagnosis not present

## 2022-10-28 DIAGNOSIS — R0602 Shortness of breath: Secondary | ICD-10-CM | POA: Diagnosis not present

## 2022-10-28 DIAGNOSIS — R079 Chest pain, unspecified: Secondary | ICD-10-CM | POA: Diagnosis not present

## 2022-10-28 DIAGNOSIS — R404 Transient alteration of awareness: Secondary | ICD-10-CM | POA: Diagnosis not present

## 2022-11-05 NOTE — Addendum Note (Signed)
Addended by: Douglass Rivers D on: 11/05/2022 02:03 PM   Modules accepted: Level of Service

## 2022-11-05 NOTE — Progress Notes (Signed)
Remote pacemaker transmission.   

## 2022-11-11 DEATH — deceased

## 2022-11-13 ENCOUNTER — Encounter: Payer: Medicare Other | Admitting: Cardiology

## 2022-12-11 ENCOUNTER — Encounter (INDEPENDENT_AMBULATORY_CARE_PROVIDER_SITE_OTHER): Payer: Medicare Other | Admitting: Ophthalmology

## 2023-01-10 ENCOUNTER — Ambulatory Visit: Payer: Medicare Other | Admitting: Endocrinology

## 2023-01-21 ENCOUNTER — Ambulatory Visit: Payer: Medicare Other | Admitting: Cardiovascular Disease

## 2023-01-23 ENCOUNTER — Ambulatory Visit: Payer: Medicare Other | Admitting: Podiatry
